# Patient Record
Sex: Female | Born: 1985 | Race: Black or African American | Hispanic: No | Smoking: Former smoker
Health system: Southern US, Community
[De-identification: ages and names within clinical notes are randomized; demographics above are authoritative.]

## PROBLEM LIST (undated history)

## (undated) ENCOUNTER — Emergency Department (HOSPITAL_BASED_OUTPATIENT_CLINIC_OR_DEPARTMENT_OTHER): Discharge status: Absent without leave | Payer: MEDICAID

## (undated) DIAGNOSIS — D649 Anemia, unspecified: Secondary | ICD-10-CM

## (undated) DIAGNOSIS — E11319 Type 2 diabetes mellitus with unspecified diabetic retinopathy without macular edema: Secondary | ICD-10-CM

## (undated) DIAGNOSIS — N183 Chronic kidney disease, stage 3 unspecified: Secondary | ICD-10-CM

## (undated) DIAGNOSIS — K029 Dental caries, unspecified: Secondary | ICD-10-CM

## (undated) DIAGNOSIS — R87619 Unspecified abnormal cytological findings in specimens from cervix uteri: Secondary | ICD-10-CM

## (undated) DIAGNOSIS — G709 Myoneural disorder, unspecified: Secondary | ICD-10-CM

## (undated) DIAGNOSIS — R569 Unspecified convulsions: Secondary | ICD-10-CM

## (undated) DIAGNOSIS — F329 Major depressive disorder, single episode, unspecified: Secondary | ICD-10-CM

## (undated) DIAGNOSIS — E785 Hyperlipidemia, unspecified: Secondary | ICD-10-CM

## (undated) DIAGNOSIS — E039 Hypothyroidism, unspecified: Secondary | ICD-10-CM

## (undated) DIAGNOSIS — E109 Type 1 diabetes mellitus without complications: Secondary | ICD-10-CM

## (undated) DIAGNOSIS — F419 Anxiety disorder, unspecified: Secondary | ICD-10-CM

## (undated) DIAGNOSIS — K219 Gastro-esophageal reflux disease without esophagitis: Secondary | ICD-10-CM

## (undated) DIAGNOSIS — H35039 Hypertensive retinopathy, unspecified eye: Secondary | ICD-10-CM

## (undated) DIAGNOSIS — T7491XA Unspecified adult maltreatment, confirmed, initial encounter: Secondary | ICD-10-CM

## (undated) DIAGNOSIS — I639 Cerebral infarction, unspecified: Secondary | ICD-10-CM

## (undated) DIAGNOSIS — K2970 Gastritis, unspecified, without bleeding: Secondary | ICD-10-CM

## (undated) DIAGNOSIS — H269 Unspecified cataract: Secondary | ICD-10-CM

## (undated) DIAGNOSIS — Z5189 Encounter for other specified aftercare: Secondary | ICD-10-CM

## (undated) DIAGNOSIS — G4700 Insomnia, unspecified: Secondary | ICD-10-CM

## (undated) DIAGNOSIS — I10 Essential (primary) hypertension: Secondary | ICD-10-CM

## (undated) HISTORY — DX: Type 1 diabetes mellitus without complications: E10.9

## (undated) HISTORY — DX: Hypertensive retinopathy, unspecified eye: H35.039

## (undated) HISTORY — DX: Major depressive disorder, single episode, unspecified: F32.9

## (undated) HISTORY — DX: Unspecified convulsions: R56.9

## (undated) HISTORY — DX: Dental caries, unspecified: K02.9

## (undated) HISTORY — DX: Unspecified abnormal cytological findings in specimens from cervix uteri: R87.619

## (undated) HISTORY — DX: Anxiety disorder, unspecified: F41.9

## (undated) HISTORY — DX: Hyperlipidemia, unspecified: E78.5

## (undated) HISTORY — DX: Type 2 diabetes mellitus with unspecified diabetic retinopathy without macular edema: E11.319

## (undated) HISTORY — DX: Unspecified adult maltreatment, confirmed, initial encounter: T74.91XA

## (undated) HISTORY — DX: Anemia, unspecified: D64.9

## (undated) HISTORY — DX: Unspecified cataract: H26.9

## (undated) HISTORY — DX: Encounter for other specified aftercare: Z51.89

## (undated) HISTORY — DX: Insomnia, unspecified: G47.00

## (undated) HISTORY — DX: Essential (primary) hypertension: I10

## (undated) HISTORY — DX: Hypothyroidism, unspecified: E03.9

## (undated) HISTORY — DX: Gastritis, unspecified, without bleeding: K29.70

## (undated) SURGERY — EGD (ESOPHAGOGASTRODUODENOSCOPY)
Anesthesia: Monitor Anesthesia Care
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
## 2004-10-06 ENCOUNTER — Ambulatory Visit: Payer: Self-pay | Admitting: *Deleted

## 2004-10-27 ENCOUNTER — Emergency Department (HOSPITAL_COMMUNITY): Admission: EM | Admit: 2004-10-27 | Discharge: 2004-10-27 | Payer: Self-pay | Admitting: Emergency Medicine

## 2004-11-09 ENCOUNTER — Inpatient Hospital Stay (HOSPITAL_COMMUNITY): Admission: EM | Admit: 2004-11-09 | Discharge: 2004-11-12 | Payer: Self-pay | Admitting: Emergency Medicine

## 2004-11-15 ENCOUNTER — Ambulatory Visit (HOSPITAL_COMMUNITY): Admission: RE | Admit: 2004-11-15 | Discharge: 2004-11-15 | Payer: Self-pay | Admitting: Internal Medicine

## 2004-12-13 ENCOUNTER — Emergency Department (HOSPITAL_COMMUNITY): Admission: EM | Admit: 2004-12-13 | Discharge: 2004-12-14 | Payer: Self-pay | Admitting: Emergency Medicine

## 2004-12-19 HISTORY — PX: FOOT FUSION: SHX956

## 2004-12-24 ENCOUNTER — Ambulatory Visit: Payer: Self-pay | Admitting: Internal Medicine

## 2004-12-29 ENCOUNTER — Encounter (HOSPITAL_COMMUNITY): Admission: RE | Admit: 2004-12-29 | Discharge: 2005-03-29 | Payer: Self-pay | Admitting: *Deleted

## 2004-12-31 ENCOUNTER — Encounter (INDEPENDENT_AMBULATORY_CARE_PROVIDER_SITE_OTHER): Payer: Self-pay | Admitting: *Deleted

## 2004-12-31 ENCOUNTER — Ambulatory Visit: Payer: Self-pay | Admitting: Internal Medicine

## 2004-12-31 ENCOUNTER — Inpatient Hospital Stay (HOSPITAL_COMMUNITY): Admission: EM | Admit: 2004-12-31 | Discharge: 2005-01-01 | Payer: Self-pay | Admitting: Emergency Medicine

## 2005-01-13 LAB — CONVERTED CEMR LAB: Pap Smear: NORMAL

## 2005-01-31 ENCOUNTER — Ambulatory Visit: Payer: Self-pay

## 2005-02-18 ENCOUNTER — Ambulatory Visit: Payer: Self-pay | Admitting: Internal Medicine

## 2005-03-24 ENCOUNTER — Ambulatory Visit: Payer: Self-pay | Admitting: Internal Medicine

## 2005-04-15 ENCOUNTER — Ambulatory Visit: Payer: Self-pay | Admitting: Internal Medicine

## 2005-05-18 ENCOUNTER — Ambulatory Visit: Payer: Self-pay | Admitting: Internal Medicine

## 2005-06-24 ENCOUNTER — Ambulatory Visit: Payer: Self-pay | Admitting: Internal Medicine

## 2005-07-18 ENCOUNTER — Emergency Department (HOSPITAL_COMMUNITY): Admission: EM | Admit: 2005-07-18 | Discharge: 2005-07-18 | Payer: Self-pay | Admitting: Emergency Medicine

## 2005-08-07 ENCOUNTER — Ambulatory Visit: Payer: Self-pay | Admitting: Psychiatry

## 2005-08-07 ENCOUNTER — Inpatient Hospital Stay (HOSPITAL_COMMUNITY): Admission: EM | Admit: 2005-08-07 | Discharge: 2005-08-10 | Payer: Self-pay | Admitting: Psychiatry

## 2005-10-11 ENCOUNTER — Emergency Department (HOSPITAL_COMMUNITY): Admission: EM | Admit: 2005-10-11 | Discharge: 2005-10-11 | Payer: Self-pay | Admitting: Family Medicine

## 2005-10-15 ENCOUNTER — Emergency Department (HOSPITAL_COMMUNITY): Admission: AD | Admit: 2005-10-15 | Discharge: 2005-10-15 | Payer: Self-pay | Admitting: Family Medicine

## 2005-10-21 ENCOUNTER — Emergency Department (HOSPITAL_COMMUNITY): Admission: EM | Admit: 2005-10-21 | Discharge: 2005-10-21 | Payer: Self-pay | Admitting: Family Medicine

## 2005-10-25 ENCOUNTER — Ambulatory Visit: Payer: Self-pay | Admitting: Internal Medicine

## 2005-12-13 ENCOUNTER — Emergency Department (HOSPITAL_COMMUNITY): Admission: EM | Admit: 2005-12-13 | Discharge: 2005-12-13 | Payer: Self-pay | Admitting: Family Medicine

## 2006-01-06 ENCOUNTER — Emergency Department (HOSPITAL_COMMUNITY): Admission: EM | Admit: 2006-01-06 | Discharge: 2006-01-06 | Payer: Self-pay | Admitting: Family Medicine

## 2006-02-06 ENCOUNTER — Emergency Department (HOSPITAL_COMMUNITY): Admission: EM | Admit: 2006-02-06 | Discharge: 2006-02-06 | Payer: Self-pay | Admitting: Family Medicine

## 2006-03-20 ENCOUNTER — Emergency Department (HOSPITAL_COMMUNITY): Admission: EM | Admit: 2006-03-20 | Discharge: 2006-03-20 | Payer: Self-pay | Admitting: Emergency Medicine

## 2006-04-14 ENCOUNTER — Emergency Department (HOSPITAL_COMMUNITY): Admission: EM | Admit: 2006-04-14 | Discharge: 2006-04-14 | Payer: Self-pay | Admitting: Family Medicine

## 2006-04-18 ENCOUNTER — Ambulatory Visit: Payer: Self-pay | Admitting: Internal Medicine

## 2006-04-20 ENCOUNTER — Ambulatory Visit (HOSPITAL_COMMUNITY): Admission: RE | Admit: 2006-04-20 | Discharge: 2006-04-20 | Payer: Self-pay | Admitting: *Deleted

## 2006-04-26 ENCOUNTER — Ambulatory Visit: Payer: Self-pay | Admitting: Internal Medicine

## 2006-05-10 ENCOUNTER — Ambulatory Visit: Payer: Self-pay | Admitting: Internal Medicine

## 2006-05-23 ENCOUNTER — Emergency Department (HOSPITAL_COMMUNITY): Admission: EM | Admit: 2006-05-23 | Discharge: 2006-05-23 | Payer: Self-pay | Admitting: Emergency Medicine

## 2006-05-26 ENCOUNTER — Emergency Department (HOSPITAL_COMMUNITY): Admission: EM | Admit: 2006-05-26 | Discharge: 2006-05-26 | Payer: Self-pay | Admitting: Emergency Medicine

## 2006-06-24 ENCOUNTER — Emergency Department (HOSPITAL_COMMUNITY): Admission: EM | Admit: 2006-06-24 | Discharge: 2006-06-24 | Payer: Self-pay | Admitting: Emergency Medicine

## 2006-06-27 ENCOUNTER — Inpatient Hospital Stay (HOSPITAL_COMMUNITY): Admission: EM | Admit: 2006-06-27 | Discharge: 2006-06-29 | Payer: Self-pay | Admitting: Emergency Medicine

## 2006-06-27 ENCOUNTER — Ambulatory Visit: Payer: Self-pay | Admitting: Internal Medicine

## 2006-07-12 ENCOUNTER — Inpatient Hospital Stay (HOSPITAL_COMMUNITY): Admission: EM | Admit: 2006-07-12 | Discharge: 2006-07-15 | Payer: Self-pay | Admitting: Emergency Medicine

## 2006-07-31 ENCOUNTER — Emergency Department (HOSPITAL_COMMUNITY): Admission: EM | Admit: 2006-07-31 | Discharge: 2006-07-31 | Payer: Self-pay | Admitting: Emergency Medicine

## 2006-08-28 ENCOUNTER — Encounter (INDEPENDENT_AMBULATORY_CARE_PROVIDER_SITE_OTHER): Payer: Self-pay | Admitting: *Deleted

## 2006-08-28 ENCOUNTER — Ambulatory Visit: Payer: Self-pay | Admitting: Internal Medicine

## 2006-09-14 DIAGNOSIS — F329 Major depressive disorder, single episode, unspecified: Secondary | ICD-10-CM

## 2006-09-14 DIAGNOSIS — F3289 Other specified depressive episodes: Secondary | ICD-10-CM

## 2006-09-14 DIAGNOSIS — E039 Hypothyroidism, unspecified: Secondary | ICD-10-CM | POA: Insufficient documentation

## 2006-09-14 HISTORY — DX: Other specified depressive episodes: F32.89

## 2006-09-14 HISTORY — DX: Major depressive disorder, single episode, unspecified: F32.9

## 2006-11-15 ENCOUNTER — Emergency Department (HOSPITAL_COMMUNITY): Admission: EM | Admit: 2006-11-15 | Discharge: 2006-11-15 | Payer: Self-pay | Admitting: Emergency Medicine

## 2006-11-17 ENCOUNTER — Encounter (INDEPENDENT_AMBULATORY_CARE_PROVIDER_SITE_OTHER): Payer: Self-pay | Admitting: Specialist

## 2006-11-17 ENCOUNTER — Ambulatory Visit: Payer: Self-pay | Admitting: Internal Medicine

## 2006-11-17 ENCOUNTER — Encounter (INDEPENDENT_AMBULATORY_CARE_PROVIDER_SITE_OTHER): Payer: Self-pay | Admitting: *Deleted

## 2006-11-17 LAB — CONVERTED CEMR LAB
Calcium: 9.7 mg/dL (ref 8.4–10.5)
Candida species: NEGATIVE
GC Probe Amp, Genital: NEGATIVE
Leukocytes, UA: NEGATIVE
Microalb Creat Ratio: 5.1 mg/g (ref 0.0–30.0)
Microalb, Ur: 0.2 mg/dL (ref 0.00–1.89)
Nitrite: NEGATIVE
Potassium: 4.2 meq/L (ref 3.5–5.3)
Protein, ur: NEGATIVE mg/dL
RBC / HPF: NONE SEEN (ref <3)
Sodium: 129 meq/L — ABNORMAL LOW (ref 135–145)
Specific Gravity, Urine: 1.04 — ABNORMAL HIGH (ref 1.005–1.03)
TSH: 9.839 microintl units/mL — ABNORMAL HIGH (ref 0.350–5.50)
Trichomonal Vaginitis: NEGATIVE
Urobilinogen, UA: 0.2 (ref 0.0–1.0)
WBC, UA: NONE SEEN cells/hpf (ref <3)

## 2006-11-30 ENCOUNTER — Ambulatory Visit: Payer: Self-pay | Admitting: *Deleted

## 2006-12-26 ENCOUNTER — Encounter (INDEPENDENT_AMBULATORY_CARE_PROVIDER_SITE_OTHER): Payer: Self-pay | Admitting: *Deleted

## 2006-12-26 ENCOUNTER — Ambulatory Visit: Payer: Self-pay | Admitting: Internal Medicine

## 2006-12-26 LAB — CONVERTED CEMR LAB
BUN: 9 mg/dL (ref 6–23)
Calcium: 9.4 mg/dL (ref 8.4–10.5)
Creatinine, Ser: 0.75 mg/dL (ref 0.40–1.20)
Glucose, Bld: 580 mg/dL (ref 70–99)
TSH: 5.848 microintl units/mL — ABNORMAL HIGH (ref 0.350–5.50)

## 2007-01-10 ENCOUNTER — Telehealth: Payer: Self-pay | Admitting: *Deleted

## 2007-02-07 ENCOUNTER — Emergency Department (HOSPITAL_COMMUNITY): Admission: EM | Admit: 2007-02-07 | Discharge: 2007-02-07 | Payer: Self-pay | Admitting: Emergency Medicine

## 2007-02-16 ENCOUNTER — Encounter (INDEPENDENT_AMBULATORY_CARE_PROVIDER_SITE_OTHER): Payer: Self-pay | Admitting: *Deleted

## 2007-04-28 ENCOUNTER — Emergency Department (HOSPITAL_COMMUNITY): Admission: EM | Admit: 2007-04-28 | Discharge: 2007-04-28 | Payer: Self-pay | Admitting: Emergency Medicine

## 2007-06-20 ENCOUNTER — Ambulatory Visit: Payer: Self-pay | Admitting: *Deleted

## 2007-06-20 ENCOUNTER — Encounter (INDEPENDENT_AMBULATORY_CARE_PROVIDER_SITE_OTHER): Payer: Self-pay | Admitting: Internal Medicine

## 2007-06-21 LAB — CONVERTED CEMR LAB
BUN: 8 mg/dL (ref 6–23)
Calcium: 9.9 mg/dL (ref 8.4–10.5)
Chlamydia, Swab/Urine, PCR: NEGATIVE
Chloride: 101 meq/L (ref 96–112)
Creatinine, Ser: 0.76 mg/dL (ref 0.40–1.20)
HCT: 34.7 % — ABNORMAL LOW (ref 36.0–46.0)
Hemoglobin: 10.8 g/dL — ABNORMAL LOW (ref 12.0–15.0)
MCHC: 31.1 g/dL (ref 30.0–36.0)
MCV: 85.9 fL (ref 78.0–100.0)
Microalb Creat Ratio: 5.1 mg/g (ref 0.0–30.0)
RDW: 12.7 % (ref 11.5–14.0)

## 2007-07-03 ENCOUNTER — Ambulatory Visit: Payer: Self-pay | Admitting: Hospitalist

## 2007-07-03 ENCOUNTER — Encounter (INDEPENDENT_AMBULATORY_CARE_PROVIDER_SITE_OTHER): Payer: Self-pay | Admitting: Internal Medicine

## 2007-07-03 LAB — CONVERTED CEMR LAB
Blood Glucose, Fingerstick: 259
Leukocytes, UA: NEGATIVE
Nitrite: NEGATIVE
Protein, ur: NEGATIVE mg/dL
pH: 6 (ref 5.0–8.0)

## 2007-08-02 ENCOUNTER — Telehealth: Payer: Self-pay | Admitting: *Deleted

## 2007-08-13 ENCOUNTER — Telehealth: Payer: Self-pay | Admitting: *Deleted

## 2007-09-17 ENCOUNTER — Encounter (INDEPENDENT_AMBULATORY_CARE_PROVIDER_SITE_OTHER): Payer: Self-pay | Admitting: Internal Medicine

## 2007-09-17 ENCOUNTER — Ambulatory Visit: Payer: Self-pay | Admitting: Internal Medicine

## 2007-09-20 ENCOUNTER — Encounter (INDEPENDENT_AMBULATORY_CARE_PROVIDER_SITE_OTHER): Payer: Self-pay | Admitting: Internal Medicine

## 2007-09-20 LAB — CONVERTED CEMR LAB
CO2: 28 meq/L (ref 19–32)
Glucose, Bld: 165 mg/dL — ABNORMAL HIGH (ref 70–99)
Potassium: 4 meq/L (ref 3.5–5.3)
Sodium: 140 meq/L (ref 135–145)

## 2007-09-25 ENCOUNTER — Telehealth: Payer: Self-pay | Admitting: *Deleted

## 2007-10-02 ENCOUNTER — Encounter (INDEPENDENT_AMBULATORY_CARE_PROVIDER_SITE_OTHER): Payer: Self-pay | Admitting: Internal Medicine

## 2007-12-09 ENCOUNTER — Inpatient Hospital Stay (HOSPITAL_COMMUNITY): Admission: EM | Admit: 2007-12-09 | Discharge: 2007-12-10 | Payer: Self-pay | Admitting: Emergency Medicine

## 2007-12-09 ENCOUNTER — Ambulatory Visit: Payer: Self-pay | Admitting: Internal Medicine

## 2007-12-26 ENCOUNTER — Encounter (INDEPENDENT_AMBULATORY_CARE_PROVIDER_SITE_OTHER): Payer: Self-pay | Admitting: Internal Medicine

## 2007-12-27 ENCOUNTER — Telehealth: Payer: Self-pay | Admitting: *Deleted

## 2007-12-28 ENCOUNTER — Telehealth (INDEPENDENT_AMBULATORY_CARE_PROVIDER_SITE_OTHER): Payer: Self-pay | Admitting: *Deleted

## 2008-01-08 ENCOUNTER — Ambulatory Visit: Payer: Self-pay | Admitting: Internal Medicine

## 2008-01-08 ENCOUNTER — Encounter (INDEPENDENT_AMBULATORY_CARE_PROVIDER_SITE_OTHER): Payer: Self-pay | Admitting: Internal Medicine

## 2008-01-09 LAB — CONVERTED CEMR LAB
AST: 16 units/L (ref 0–37)
Alkaline Phosphatase: 75 units/L (ref 39–117)
BUN: 8 mg/dL (ref 6–23)
Basophils Relative: 1 % (ref 0–1)
Calcium: 9.4 mg/dL (ref 8.4–10.5)
Chloride: 100 meq/L (ref 96–112)
Cortisol, Plasma: 17.5 ug/dL
Creatinine, Ser: 0.77 mg/dL (ref 0.40–1.20)
Eosinophils Absolute: 0.1 10*3/uL (ref 0.0–0.7)
Hemoglobin: 12 g/dL (ref 12.0–15.0)
MCHC: 32.9 g/dL (ref 30.0–36.0)
MCV: 85.7 fL (ref 78.0–100.0)
Monocytes Absolute: 0.6 10*3/uL (ref 0.1–1.0)
Monocytes Relative: 13 % — ABNORMAL HIGH (ref 3–12)
RBC: 4.26 M/uL (ref 3.87–5.11)

## 2008-01-14 ENCOUNTER — Ambulatory Visit (HOSPITAL_COMMUNITY): Admission: RE | Admit: 2008-01-14 | Discharge: 2008-01-14 | Payer: Self-pay | Admitting: Internal Medicine

## 2008-01-14 ENCOUNTER — Encounter (INDEPENDENT_AMBULATORY_CARE_PROVIDER_SITE_OTHER): Payer: Self-pay | Admitting: Internal Medicine

## 2008-01-25 ENCOUNTER — Telehealth (INDEPENDENT_AMBULATORY_CARE_PROVIDER_SITE_OTHER): Payer: Self-pay | Admitting: *Deleted

## 2008-02-19 ENCOUNTER — Ambulatory Visit: Payer: Self-pay | Admitting: *Deleted

## 2008-03-06 ENCOUNTER — Encounter (INDEPENDENT_AMBULATORY_CARE_PROVIDER_SITE_OTHER): Payer: Self-pay | Admitting: Internal Medicine

## 2008-03-26 ENCOUNTER — Encounter (INDEPENDENT_AMBULATORY_CARE_PROVIDER_SITE_OTHER): Payer: Self-pay | Admitting: Internal Medicine

## 2008-03-26 ENCOUNTER — Ambulatory Visit: Payer: Self-pay | Admitting: Hospitalist

## 2008-03-26 LAB — CONVERTED CEMR LAB: Blood Glucose, Fingerstick: 168

## 2008-03-27 LAB — CONVERTED CEMR LAB
BUN: 8 mg/dL (ref 6–23)
Calcium: 9.4 mg/dL (ref 8.4–10.5)
Creatinine, Ser: 0.75 mg/dL (ref 0.40–1.20)
Glucose, Bld: 196 mg/dL — ABNORMAL HIGH (ref 70–99)
Microalb, Ur: 2.08 mg/dL — ABNORMAL HIGH (ref 0.00–1.89)
Sodium: 139 meq/L (ref 135–145)
TSH: 3.053 microintl units/mL (ref 0.350–5.50)

## 2008-04-29 ENCOUNTER — Inpatient Hospital Stay (HOSPITAL_COMMUNITY): Admission: EM | Admit: 2008-04-29 | Discharge: 2008-05-03 | Payer: Self-pay | Admitting: Emergency Medicine

## 2008-04-29 ENCOUNTER — Ambulatory Visit: Payer: Self-pay | Admitting: Internal Medicine

## 2008-05-02 ENCOUNTER — Encounter (INDEPENDENT_AMBULATORY_CARE_PROVIDER_SITE_OTHER): Payer: Self-pay | Admitting: Internal Medicine

## 2008-05-03 ENCOUNTER — Encounter (INDEPENDENT_AMBULATORY_CARE_PROVIDER_SITE_OTHER): Payer: Self-pay | Admitting: *Deleted

## 2008-05-05 ENCOUNTER — Encounter: Payer: Self-pay | Admitting: Gastroenterology

## 2008-05-08 ENCOUNTER — Ambulatory Visit: Payer: Self-pay | Admitting: Gastroenterology

## 2008-05-20 ENCOUNTER — Ambulatory Visit: Payer: Self-pay | Admitting: Internal Medicine

## 2008-05-26 ENCOUNTER — Telehealth: Payer: Self-pay | Admitting: *Deleted

## 2008-06-04 ENCOUNTER — Emergency Department (HOSPITAL_COMMUNITY): Admission: EM | Admit: 2008-06-04 | Discharge: 2008-06-04 | Payer: Self-pay | Admitting: Emergency Medicine

## 2008-10-01 ENCOUNTER — Encounter (INDEPENDENT_AMBULATORY_CARE_PROVIDER_SITE_OTHER): Payer: Self-pay | Admitting: Internal Medicine

## 2008-10-01 ENCOUNTER — Ambulatory Visit: Payer: Self-pay | Admitting: Infectious Disease

## 2008-10-01 DIAGNOSIS — D649 Anemia, unspecified: Secondary | ICD-10-CM

## 2008-10-01 LAB — CONVERTED CEMR LAB: Blood Glucose, Fingerstick: 363

## 2008-10-02 ENCOUNTER — Ambulatory Visit (HOSPITAL_COMMUNITY): Admission: RE | Admit: 2008-10-02 | Discharge: 2008-10-02 | Payer: Self-pay | Admitting: Internal Medicine

## 2008-10-03 LAB — CONVERTED CEMR LAB
Alkaline Phosphatase: 73 units/L (ref 39–117)
BUN: 6 mg/dL (ref 6–23)
Bilirubin Urine: NEGATIVE
CO2: 24 meq/L (ref 19–32)
Cholesterol: 220 mg/dL — ABNORMAL HIGH (ref 0–200)
Eosinophils Absolute: 0 10*3/uL (ref 0.0–0.7)
Eosinophils Relative: 1 % (ref 0–5)
Ferritin: 56 ng/mL (ref 10–291)
Glucose, Bld: 417 mg/dL — ABNORMAL HIGH (ref 70–99)
HCT: 39 % (ref 36.0–46.0)
HDL: 69 mg/dL (ref >39)
Hemoglobin: 12.1 g/dL (ref 12.0–15.0)
LDL Cholesterol: 133 mg/dL — ABNORMAL HIGH (ref 0–99)
Leukocytes, UA: NEGATIVE
Lymphocytes Relative: 41 % (ref 12–46)
Lymphs Abs: 1.5 10*3/uL (ref 0.7–4.0)
MCV: 85.2 fL (ref 78.0–100.0)
Monocytes Absolute: 0.3 10*3/uL (ref 0.1–1.0)
Monocytes Relative: 9 % (ref 3–12)
Protein, ur: NEGATIVE mg/dL
RBC: 4.58 M/uL (ref 3.87–5.11)
Specific Gravity, Urine: 1.031 — ABNORMAL HIGH (ref 1.005–1.03)
Total Bilirubin: 0.6 mg/dL (ref 0.3–1.2)
Triglycerides: 88 mg/dL (ref <150)
Urine Glucose: >1000 mg/dL — AB
Urobilinogen, UA: 0.2 (ref 0.0–1.0)
VLDL: 18 mg/dL (ref 0–40)
WBC: 3.7 10*3/uL — ABNORMAL LOW (ref 4.0–10.5)

## 2008-12-19 ENCOUNTER — Encounter: Payer: Self-pay | Admitting: Internal Medicine

## 2009-03-02 ENCOUNTER — Telehealth (INDEPENDENT_AMBULATORY_CARE_PROVIDER_SITE_OTHER): Payer: Self-pay | Admitting: Pharmacy Technician

## 2009-03-03 ENCOUNTER — Emergency Department (HOSPITAL_COMMUNITY): Admission: EM | Admit: 2009-03-03 | Discharge: 2009-03-03 | Payer: Self-pay | Admitting: Emergency Medicine

## 2009-03-13 ENCOUNTER — Telehealth (INDEPENDENT_AMBULATORY_CARE_PROVIDER_SITE_OTHER): Payer: Self-pay | Admitting: Internal Medicine

## 2009-03-31 ENCOUNTER — Ambulatory Visit: Payer: Self-pay | Admitting: Internal Medicine

## 2009-03-31 LAB — CONVERTED CEMR LAB: Blood Glucose, Fingerstick: 169

## 2009-04-01 ENCOUNTER — Encounter (INDEPENDENT_AMBULATORY_CARE_PROVIDER_SITE_OTHER): Payer: Self-pay | Admitting: Internal Medicine

## 2009-04-03 ENCOUNTER — Ambulatory Visit (HOSPITAL_COMMUNITY): Admission: RE | Admit: 2009-04-03 | Discharge: 2009-04-03 | Payer: Self-pay | Admitting: Internal Medicine

## 2009-04-03 ENCOUNTER — Encounter (INDEPENDENT_AMBULATORY_CARE_PROVIDER_SITE_OTHER): Payer: Self-pay | Admitting: Internal Medicine

## 2009-04-18 ENCOUNTER — Emergency Department (HOSPITAL_COMMUNITY): Admission: EM | Admit: 2009-04-18 | Discharge: 2009-04-18 | Payer: Self-pay | Admitting: Emergency Medicine

## 2009-04-23 ENCOUNTER — Ambulatory Visit: Payer: Self-pay | Admitting: Internal Medicine

## 2009-04-23 ENCOUNTER — Encounter (INDEPENDENT_AMBULATORY_CARE_PROVIDER_SITE_OTHER): Payer: Self-pay | Admitting: Internal Medicine

## 2009-04-23 LAB — CONVERTED CEMR LAB
ALT: 10 units/L (ref 0–35)
AST: 12 units/L (ref 0–37)
Albumin: 4.1 g/dL (ref 3.5–5.2)
Basophils Absolute: 0 10*3/uL (ref 0.0–0.1)
CO2: 25 meq/L (ref 19–32)
Calcium: 9 mg/dL (ref 8.4–10.5)
Chloride: 102 meq/L (ref 96–112)
Creatinine, Ser: 0.84 mg/dL (ref 0.40–1.20)
Eosinophils Absolute: 0 10*3/uL (ref 0.0–0.7)
Eosinophils Relative: 1 % (ref 0–5)
GC Probe Amp, Genital: NEGATIVE
GFR calc Af Amer: >60 mL/min (ref >60)
HCT: 31.7 % — ABNORMAL LOW (ref 36.0–46.0)
Hemoglobin: 10.2 g/dL — ABNORMAL LOW (ref 12.0–15.0)
Lymphocytes Relative: 42 % (ref 12–46)
MCHC: 32.2 g/dL (ref 30.0–36.0)
MCV: 86.1 fL (ref 78.0–100.0)
Monocytes Absolute: 0.2 10*3/uL (ref 0.1–1.0)
Platelets: 135 10*3/uL — ABNORMAL LOW (ref 150–400)
Potassium: 4 meq/L (ref 3.5–5.3)
RDW: 13.1 % (ref 11.5–15.5)
Total Protein: 6.6 g/dL (ref 6.0–8.3)

## 2009-04-23 LAB — HM PAP SMEAR

## 2009-04-24 LAB — CONVERTED CEMR LAB
Gardnerella vaginalis: POSITIVE — AB
Trichomonal Vaginitis: NEGATIVE

## 2009-07-01 ENCOUNTER — Other Ambulatory Visit: Admission: RE | Admit: 2009-07-01 | Discharge: 2009-07-01 | Payer: Self-pay | Admitting: Obstetrics and Gynecology

## 2009-07-01 ENCOUNTER — Ambulatory Visit: Payer: Self-pay | Admitting: Obstetrics and Gynecology

## 2009-08-06 ENCOUNTER — Telehealth (INDEPENDENT_AMBULATORY_CARE_PROVIDER_SITE_OTHER): Payer: Self-pay | Admitting: Internal Medicine

## 2009-08-18 ENCOUNTER — Encounter: Payer: Self-pay | Admitting: Internal Medicine

## 2009-09-18 ENCOUNTER — Telehealth (INDEPENDENT_AMBULATORY_CARE_PROVIDER_SITE_OTHER): Payer: Self-pay | Admitting: *Deleted

## 2009-10-14 ENCOUNTER — Ambulatory Visit: Payer: Self-pay | Admitting: Internal Medicine

## 2009-10-14 ENCOUNTER — Encounter: Payer: Self-pay | Admitting: Internal Medicine

## 2009-10-14 LAB — CONVERTED CEMR LAB
ALT: 12 units/L (ref 0–35)
BUN: 12 mg/dL (ref 6–23)
Basophils Relative: 0 % (ref 0–1)
CO2: 26 meq/L (ref 19–32)
Calcium: 10.8 mg/dL — ABNORMAL HIGH (ref 8.4–10.5)
Creatinine, Ser: 0.92 mg/dL (ref 0.40–1.20)
Creatinine, Urine: 120.9 mg/dL
Eosinophils Absolute: 0 10*3/uL (ref 0.0–0.7)
Eosinophils Relative: 1 % (ref 0–5)
Glucose, Bld: 209 mg/dL — ABNORMAL HIGH (ref 70–99)
HCT: 35.8 % — ABNORMAL LOW (ref 36.0–46.0)
Hemoglobin: 11.8 g/dL — ABNORMAL LOW (ref 12.0–15.0)
Hgb A1c MFr Bld: 10.5 %
Leukocytes, UA: NEGATIVE
MCHC: 33 g/dL (ref 30.0–36.0)
MCV: 83.6 fL (ref >=78.0)
Microalb Creat Ratio: 8.7 mg/g (ref 0.0–30.0)
Monocytes Relative: 8 % (ref 3–12)
Neutrophils Relative %: 44 % (ref 43–77)
Nitrite: NEGATIVE
Protein, ur: NEGATIVE mg/dL
Specific Gravity, Urine: 1.016 (ref 1.005–1.0)
TSH: 10.53 microintl units/mL — ABNORMAL HIGH (ref 0.350–4.5)
Total Bilirubin: 0.4 mg/dL (ref 0.3–1.2)
Urobilinogen, UA: 0.2 (ref 0.0–1.0)
WBC: 3.8 10*3/uL — ABNORMAL LOW (ref 4.0–10.5)

## 2010-03-10 ENCOUNTER — Telehealth: Payer: Self-pay | Admitting: Internal Medicine

## 2010-03-17 ENCOUNTER — Telehealth: Payer: Self-pay | Admitting: *Deleted

## 2010-04-15 ENCOUNTER — Encounter: Payer: Self-pay | Admitting: Internal Medicine

## 2010-04-20 ENCOUNTER — Telehealth: Payer: Self-pay | Admitting: *Deleted

## 2010-04-20 ENCOUNTER — Emergency Department (HOSPITAL_COMMUNITY): Admission: EM | Admit: 2010-04-20 | Discharge: 2010-04-20 | Payer: Self-pay | Admitting: Emergency Medicine

## 2010-04-21 ENCOUNTER — Ambulatory Visit: Payer: Self-pay | Admitting: Internal Medicine

## 2010-04-21 LAB — CONVERTED CEMR LAB
Blood Glucose, Fingerstick: 327
Hgb A1c MFr Bld: 8.2 %

## 2010-04-22 ENCOUNTER — Encounter: Payer: Self-pay | Admitting: Internal Medicine

## 2010-04-22 LAB — CONVERTED CEMR LAB
BUN: 11 mg/dL (ref 6–23)
Calcium: 9.8 mg/dL (ref 8.4–10.5)
Creatinine, Ser: 0.83 mg/dL (ref 0.40–1.20)
Free T4: 0.76 ng/dL — ABNORMAL LOW (ref 0.80–1.80)
Vitamin B-12: 421 pg/mL (ref 211–911)

## 2010-04-28 ENCOUNTER — Ambulatory Visit: Payer: Self-pay | Admitting: Internal Medicine

## 2010-04-28 LAB — CONVERTED CEMR LAB
ALT: 16 units/L (ref 0–35)
AST: 18 units/L (ref 0–37)
Bilirubin, Direct: 0.1 mg/dL (ref 0.0–0.3)
Indirect Bilirubin: 0.3 mg/dL (ref 0.0–0.9)
Lipase: <10 units/L (ref 0–75)
Total Protein: 8.2 g/dL (ref 6.0–8.3)

## 2010-09-01 ENCOUNTER — Telehealth: Payer: Self-pay | Admitting: Internal Medicine

## 2010-09-21 ENCOUNTER — Emergency Department (HOSPITAL_COMMUNITY): Admission: EM | Admit: 2010-09-21 | Discharge: 2010-09-21 | Payer: Self-pay | Admitting: Emergency Medicine

## 2010-11-01 ENCOUNTER — Emergency Department (HOSPITAL_COMMUNITY): Admission: EM | Admit: 2010-11-01 | Discharge: 2010-11-01 | Payer: Self-pay | Admitting: Emergency Medicine

## 2010-11-30 ENCOUNTER — Telehealth: Payer: Self-pay | Admitting: Internal Medicine

## 2010-12-29 ENCOUNTER — Ambulatory Visit: Admit: 2010-12-29 | Payer: Self-pay

## 2011-01-12 ENCOUNTER — Telehealth: Payer: Self-pay | Admitting: Internal Medicine

## 2011-01-18 NOTE — Progress Notes (Signed)
Summary: med refill/gp  Phone Note Refill Request Message from:  Fax from Pharmacy on September 01, 2010 4:25 PM  Refills Requested: Medication #1:  SYNTHROID 75 MCG TABS Take 1 tablet by mouth daily.   Dosage confirmed as above?Dosage Confirmed   Last Refilled: 05/29/2010 Last appt. 04/28/10.  Last TSH 04/21/10.   Method Requested: Telephone to Pharmacy Initial call taken by: Morrison Old RN,  September 01, 2010 4:25 PM  Follow-up for Phone Call       Follow-up by: Lester [place] MD,  September 02, 2010 9:31 AM    Prescriptions: SYNTHROID 75 MCG TABS (LEVOTHYROXINE SODIUM) Take 1 tablet by mouth daily.  #30 x 3   Entered and Authorized by:   Lester [place] MD   Signed by:   Lester Clementon MD on 09/02/2010   Method used:   Faxed to ...       [facility] (retail)       [address] [place]       [place], [place]  [address]       Ph: [phone]       Fax: [phone]   RxID:   [id number]

## 2011-01-18 NOTE — Assessment & Plan Note (Signed)
Summary: f/u ems, cbg 29 4/3/pcp-devani/hla   Vital Signs:  Patient profile:   25 year old female Height:      65.25 inches (165.74 cm) Weight:      159.1 pounds (72.32 kg) BMI:     26.37 Temp:     97.5 degrees F (36.39 degrees C) oral Pulse rate:   89 / minute BP sitting:   127 / 89  (right arm)  Vitals Entered By: Hilda Blades Ditzler RN (Apr 21, 2010 11:18 AM) Is Patient Diabetic? Yes Did you bring your meter with you today? No Pain Assessment Patient in pain? yes     Location: chest Intensity: 5 Type: heavy Onset of pain  past 2 days Nutritional Status BMI of 25 - 29 = overweight Nutritional Status Detail appetite fair CBG Result 327  Have you ever been in a relationship where you felt threatened, hurt or afraid?denies   Does patient need assistance? Functional Status Self care Ambulation Normal Comments ER FU - CBG 29 by EMS - exercising more. Novalog makes legs swollen. Numbness right arm x 3 months. CBG are low in AM past 3 months.   Primary Care Provider:  Lester [place] MD   History of Present Illness: She present for follow up after  hypoglycemic  event. She wake up yesterday and she ate strawberrys  and jelly  then she went back to sleep, her syster try to wake her up around 11 Am. and patient  was unresponsive EMS arrived and her blood suagar was at 68. She had dinner the night before. She is using 40 units of lantus at bedtime.  Her blood sugar,  she said has been in the range of 100 to 135 in the morning. She didnt  check blood sugar today or yersterday. Last time she check blood sugar was 3 days ago and in it  was at 130. She has been using 40 units of lantus for a long time.   She is complaining of numbness and tingling on her 4 and 5 finger for months now.  She denies trauma. Numbness get worse whe she raised right arm. No weakness.   Depression History:      The patient denies a depressed mood most of the day and a diminished interest in her usual daily  activities.         Preventive Screening-Counseling & Management  Alcohol-Tobacco     Smoking Status: quit     Smoking Cessation Counseling: yes     Smoke Cessation Stage: relapse     Packs/Day: 1.0     Year Started: 1999     Year Quit: 12/2009     Passive Smoke Counseling: to avoid passive smoke exposure  Allergies: 1)  ! Trimethoprim (Trimethoprim) 2)  ! Sulfamethoxazole  Social History: Smoking Status:  quit  Review of Systems  The patient denies fever, chest pain, syncope, dyspnea on exertion, peripheral edema, prolonged cough, headaches, hemoptysis, abdominal pain, and melena.    Physical Exam  General:  alert, well-developed, and well-nourished.   Head:  normocephalic, atraumatic, and no abnormalities observed.   Lungs:  normal respiratory effort, no intercostal retractions, no accessory muscle use, and normal breath sounds.   Heart:  normal rate and regular rhythm.   Abdomen:  soft, non-tender, normal bowel sounds, no distention, and no masses.   Extremities:  No edema. Neurologic:  alert & oriented X3, cranial nerves II-XII intact, strength normal in all extremities, and sensation intact to light touch.  Impression & Recommendations:  Problem # 1:  DIABETES MELLITUS, TYPE I (ICD-250.01) I will decrease lantus to half dose. She has not been checking her blood sugar in the morning. I will see her in 1 week. I advised her to check blood sugar in the morning and after meals. She relates swelling with use of novolog. I will consider other short acting insulin next visit. I advised patient not to skip meals. I advised her to let us know if she have any low blood sugar. Her HBA1c is better now at 8.  Her updated medication list for this problem includes:    Lantus 100 Unit/ml Soln (Insulin glargine) .[name]... Take 20  units once every night    Novolog 100 Unit/ml Soln (Insulin aspart) .[name]... 1 unit per 15 grams of carbs  Orders: T- Capillary Blood Glucose [id number]) T-Hgb A1C  (in-house) ([id number]) T-Basic Metabolic Panel ([id number])  Problem # 2:  HYPOTHYROIDISM, POSTABLATIVE NEC (ICD-244.1) I will check TSH and Free t4 , will adjust medicine as needed.  Her updated medication list for this problem includes:    Synthroid 50 Mcg Tabs (Levothyroxine sodium) .[name]... Take 1 tablet by mouth once a day  Orders: T-TSH [id number]) T-T4, Free [phone])  Problem # 3:  DIABETIC PERIPHERAL NEUROPATHY (ICD-250.60) She is complaining of numbness and tingling 4 and 5 finger right hand. I will check B12. I think she might benefit MRI spine, will consider this next visit.  Her updated medication list for this problem includes:    Lantus 100 Unit/ml Soln (Insulin glargine) .[name]... Take 20  units once every night    Novolog 100 Unit/ml Soln (Insulin aspart) .[name]... 1 unit per 15 grams of carbs  Orders: T-Vitamin B12 [id number])  Complete Medication List: 1)  Amitriptyline Hcl 50 Mg Tabs (Amitriptyline hcl) .... Take one at bed time 2)  Synthroid 50 Mcg Tabs (Levothyroxine sodium) .... Take 1 tablet by mouth once a day 3)  Lantus 100 Unit/ml Soln (Insulin glargine) .... Take 20  units once every night 4)  Ferrous Sulfate 325 (65 Fe) Mg Tbec (Ferrous sulfate) .... Take one pill two times a day 5)  Proair Hfa 108 (90 Base) Mcg/act Aers (Albuterol sulfate) .... Take 1-2 puffs once every 4-6 hour as needed for shortness of breath 6)  Oxymetazoline Hcl 0.05 % Soln (Oxymetazoline hcl) .... Take 2 sprays per nostril twice a day 7)  Prochlorperazine Maleate 10 Mg Tabs (Prochlorperazine maleate) .... One tab every 6 hours as needed for nausea 8)  Percocet 5-325 Mg Tabs (Oxycodone-acetaminophen) .... One tab every 6 hours for abdominal pain 9)  Reglan 5 Mg Tabs (Metoclopramide hcl) .... Take one tab by mouth 30 minutes before every meal and before going to sleep 10)  Metronidazole 500 Mg Tabs (Metronidazole) .... Take 4 tabs by mouth on day one, then 1 tab by mouth two times a day  for 6 days 11)  Novolog 100 Unit/ml Soln (Insulin aspart) .[name].. 1 unit per 15 grams of carbs  Patient Instructions: 1)  Please schedule a follow-up appointment in 1 weeks. 2)  Please check blood sugar three times a day. fasting in the morning and 2 hours after meals.  3)  Please do not skip meals. 4)  If you have any low blood sugar symptoms take something sweet, check blood sugar and let us know.   Prevention & Chronic Care Immunizations   Influenza vaccine: Fluvax 3+  (10/14/2009)   Influenza vaccine due: 08/19/2010    Tetanus booster:  Not documented    Pneumococcal vaccine: Not documented  Other Screening   Pap smear: ATYPICAL SQUAMOUS CELLS OF UNDETERMINED SIGNIFICANCE  (04/23/2009)   Smoking status: quit  (04/21/2010)  Diabetes Mellitus   HgbA1C: 8.2  (04/21/2010)   HgbA1C action/deferral: Ordered  (10/14/2009)   Hemoglobin A1C due: 01/14/2010    Eye exam: Not documented   Diabetic eye exam action/deferral: Refused  (10/14/2009)    Foot exam: yes  (10/14/2009)   Foot exam action/deferral: Do today   High risk foot: No  (10/14/2009)   Foot care education: Done  (10/14/2009)   Foot exam due: 01/14/2010    Urine microalbumin/creatinine ratio: 8.7  (10/14/2009)   Urine microalbumin action/deferral: Ordered   Urine microalbumin/cr due: 10/14/2010  Self-Management Support :   Personal Goals (by the next clinic visit) :     Personal A1C goal: 7  (10/14/2009)     Personal blood pressure goal: 130/80  (10/14/2009)     Personal LDL goal: 100  (10/14/2009)    Patient will work on the following items until the next clinic visit to reach self-care goals:     Medications and monitoring: take my medicines every day, check my blood sugar, bring all of my medications to every visit, examine my feet every day  (04/21/2010)     Eating: eat more vegetables, use fresh or frozen vegetables, eat foods that are low in salt, eat fruit for snacks and desserts, limit or avoid alcohol   (04/21/2010)     Activity: take a 30 minute walk every day, park at the far end of the parking lot  (04/21/2010)     Other: go to gym for an hour - gets on riding bike  (10/14/2009)    Diabetes self-management support: Written self-care plan, Education handout, Resources for patients handout  (04/21/2010)   Diabetes care plan printed   Diabetes education handout printed      Resource handout printed.  Process Orders Check Orders Results:     [facility]: [id number] not required for this insurance Tests Sent for requisitioning (Apr 21, 2010 2:19 PM):     04/21/2010: [facility] -- T-Basic Metabolic Panel 0000000 (signed)     04/21/2010: [facility] -- T-TSH [phone] (signed)     04/21/2010: [facility] -- [place], [place] [[id number]] (signed)     04/21/2010: [facility] -- T-Vitamin B12 [phone] (signed)    Laboratory Results   Blood Tests   Date/Time Received: Apr 21, 2010 11:43 AM  Date/Time Reported: Lenoria Farrier  Apr 21, 2010 11:43 AM   HGBA1C: 8.2%   (Normal Range: Non-Diabetic - 3-6%   Control Diabetic - 6-8%) CBG Random:: 327mg /dL      Process Orders Check Orders Results:     [facility]: ABN not required for this insurance Tests Sent for requisitioning (Apr 21, 2010 2:19 PM):     04/21/2010: [facility] -- T-Basic Metabolic Panel 0000000 (signed)     04/21/2010: [facility] -- T-TSH [phone] (signed)     04/21/2010: [facility] -- [place], [place] [[id number]] (signed)     04/21/2010: [facility] -- T-Vitamin B12 [phone] (signed)

## 2011-01-18 NOTE — Progress Notes (Signed)
Summary: phone/gg  Phone Note Call from Patient   Summary of Call: I have called the pt several times to set up an appointment and have not been able to reach her.  She has no voice mail.  I called [facility] and they gave me another # [phone] which also has no voice mail.  A letter has been sent to her home requestion her to set up an appointment Initial call taken by: Gevena Cotton RN,  March 17, 2010 9:48 AM

## 2011-01-18 NOTE — Miscellaneous (Signed)
   Hypothyroidism: I will increase levothyroxine to 75 micrograms. I called patient and I inform her result of test.      Prescriptions: SYNTHROID 75 MCG TABS (LEVOTHYROXINE SODIUM) Take 1 tablet by mouth daily.  #30 x 3   Entered and Authorized by:   Niel Hummer MD   Signed by:   Niel Hummer MD on 04/22/2010   Method used:   Electronically to        [facility] [phone]* (retail)       [address]       [place], [place]  [address]       Ph: [phone]       Fax: [phone]   RxID:   [id number]

## 2011-01-18 NOTE — Letter (Signed)
Summary: TWO WEEK GLUCOSE   TWO WEEK GLUCOSE   Imported By: Garlan Fillers 05/03/2010 12:20:33  _____________________________________________________________________  External Attachment:    Type:   Image     Comment:   External Document

## 2011-01-18 NOTE — Progress Notes (Signed)
Summary: refill/gg  Phone Note Refill Request  on March 10, 2010 9:52 AM  Refills Requested: Medication #1:  SYNTHROID 50 MCG  TABS Take 1 tablet by mouth once a day   Last Refilled: 12/08/2009 #100 for [facility]   Method Requested: Fax to [facility] Initial call taken by: Gevena Cotton RN,  March 10, 2010 9:52 AM  Follow-up for Phone Call        Refill approved-nurse to complete Follow-up by: Lester Elon MD,  March 10, 2010 2:54 PM    Prescriptions: SYNTHROID 50 MCG  TABS (LEVOTHYROXINE SODIUM) Take 1 tablet by mouth once a day  #31 x 5   Entered and Authorized by:   Lester Virden MD   Signed by:   Lester Canby MD on 03/10/2010   Method used:   Telephoned to ...       [facility] (retail)       [address] [place]       [place], [place]  [address]       Ph: [phone]       Fax: [phone]   RxID:   [id number]   Please communicate with the patient that she needs to follow up in the clinic soon. Thanks

## 2011-01-18 NOTE — Assessment & Plan Note (Signed)
Summary: ACUTE/DEVANI/1 WEEK RECHECK/CH   Vital Signs:  Patient profile:   25 year old female Height:      65.25 inches Weight:      154.8 pounds BMI:     25.66 Temp:     97.0 degrees F oral Pulse rate:   96 / minute BP sitting:   112 / 81  (right arm)  Vitals Entered By: Silverio Decamp NT II (Apr 28, 2010 10:54 AM) CC: follow-up visit Is Patient Diabetic? Yes Did you bring your meter with you today? No Pain Assessment Patient in pain? no      Nutritional Status BMI of 25 - 29 = overweight CBG Result 137  Have you ever been in a relationship where you felt threatened, hurt or afraid?No   Does patient need assistance? Functional Status Self care Ambulation Normal   Primary Care Provider:  Lester Bethany MD  CC:  follow-up visit.  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 H/o tobacco abuse ongoing     She comes for hypoglycemia follow up. She has been checking blood sugar. She had 1 episode of hypoglycemia at 31 in the morning and another later in the  morning at 43. She is not eating regularly and she is doing exercise. She had 2 reading at 400 at night.  She is having some sinus congestion. She relates that numbness of fingers is better.  At the end of the office she mention that she is having abdominal pain intermittent bilateral upper quadrant. since 2 weeks ago. She didnt mention anything last week. SHe is also having diarrhea. 1 episode of vomiting.   Preventive Screening-Counseling & Management  Alcohol-Tobacco     Smoking Status: quit     Smoking Cessation Counseling: yes     Smoke  Cessation Stage: relapse     Packs/Day: 1.0     Year Started: 1999     Year Quit: 12/2009     Passive Smoke Counseling: to avoid passive smoke exposure  Current Medications (verified): 1)  Amitriptyline Hcl 50 Mg Tabs (Amitriptyline Hcl) .... Take One At Bed Time 2)  Synthroid 75 Mcg Tabs (Levothyroxine Sodium) .... Take 1 Tablet By Mouth Daily. 3)  Lantus 100 Unit/ml  Soln (Insulin Glargine) .... Take 15  Units Once Every Night 4)  Ferrous Sulfate 325 (65 Fe) Mg  Tbec (Ferrous Sulfate) .... Take One Pill Two Times A Day 5)  Proair Hfa 108 (90 Base) Mcg/act  Aers (Albuterol Sulfate) .... Take 1-2 Puffs Once Every 4-6 Hour As Needed For Shortness of Breath 6)  Nasonex 50 Mcg/act Susp (Mometasone Furoate) .... Use 2 Spray in Each Nostril. 7)  Allegra 180 Mg Tabs (Fexofenadine Hcl) .... Take 1 Tablet By Mouth Daily.  Allergies: 1)  ! Trimethoprim (Trimethoprim) 2)  ! Sulfamethoxazole  Review of Systems  The patient denies fever, weight loss, chest pain, syncope, dyspnea on exertion, peripheral edema, prolonged cough, headaches, hemoptysis, melena, hematochezia, and severe indigestion/heartburn.    Physical Exam  General:  alert, well-developed, and well-nourished.   Head:  normocephalic and atraumatic.   Lungs:  normal respiratory effort, no intercostal retractions, no accessory muscle use, and normal breath  sounds.   Heart:  normal rate and regular rhythm.   Abdomen:  soft, normal bowel sounds, no distention, no masses, no guarding, no rigidity, and no rebound tenderness.  mild tenderness right and left upper quadrant.    Impression & Recommendations:  Problem # 1:  DIABETES MELLITUS, TYPE I (ICD-250.01) She is still having episodes of low blood sugar. This is due to exercising and decrease food intake. I will decrease lantus to 15 units. Next visit will consider SSI.  The following medications were removed from the medication list:    Novolog 100 Unit/ml Soln (Insulin aspart) .[name]...  1 unit per 15 grams of carbs Her updated medication list for this problem includes:    Lantus 100 Unit/ml Soln (Insulin glargine) .[name]... Take 15  units once every night  The following medications were removed from the medication list:    Novolog 100 Unit/ml Soln (Insulin aspart) .[name]... 1 unit per 15 grams of carbs Her updated medication list for this problem includes:    Lantus 100 Unit/ml Soln (Insulin glargine) .[name]... Take 15  units once every night  Problem # 2:  ATOPIC RHINITIS (ICD-477.9) I will prescribe nasonex for rhinitis.  The following medications were removed from the medication list:    Oxymetazoline Hcl 0.05 % Soln (Oxymetazoline hcl) .[name]... Take 2 sprays per nostril twice a day Her updated medication list for this problem includes:    Nasonex 50 Mcg/act Susp (Mometasone furoate) ..... Use 2 spray in each nostril.    Allegra 180 Mg Tabs (Fexofenadine hcl) .[name]... Take 1 tablet by mouth daily.    Promethazine Hcl 12.5 Mg Tabs (Promethazine hcl) .[name]... Take 1 tablet by mouth every 8 hour as needed for nausea.  Her updated medication list for this problem includes:    Oxymetazoline Hcl 0.05 % Soln (Oxymetazoline hcl) .[name]... Take 2 sprays per nostril twice a day    Nasonex 50 Mcg/act Susp (Mometasone furoate) ..... Use 2 spray in each nostril.    Allegra 180 Mg Tabs (Fexofenadine hcl) .[name]... Take 1 tablet by mouth daily.  Problem # 3:  ABDOMINAL PAIN (ICD-789.00) This is likely gastroenteritis. I will check lipase, amylase, H pylori, and hemoccult. Abdominal exam was benign. I will prescribe protonix and phenerga. I will hold reglan due to diarrhea.  The following medications were removed from the medication list:    Reglan 5 Mg Tabs (Metoclopramide hcl) .[name]... Take one tab by mouth 30 minutes before every meal and before going to sleep  Orders: T-Hepatic Function [id number]) T-Lipase [id number]) T-Culture, Stool ([id number]) T-H Pylori AG, EIA ([id number]) T-Hemoccult  Card-Multiple (take home) (82270)  Complete Medication List: 1)  Amitriptyline Hcl 50 Mg Tabs (Amitriptyline hcl) .... Take one at bed time 2)  Synthroid 75 Mcg Tabs (Levothyroxine sodium) .... Take 1 tablet by mouth daily. 3)  Lantus 100 Unit/ml Soln (Insulin glargine) .... Take 15  units once every night 4)  Ferrous Sulfate 325 (65 Fe) Mg Tbec (Ferrous sulfate) .... Take one pill two times a day 5)  Proair Hfa 108 (90 Base) Mcg/act Aers (Albuterol sulfate) .... Take 1-2 puffs once every 4-6 hour as needed for shortness of breath 6)  Nasonex 50 Mcg/act Susp (Mometasone furoate) .... Use 2 spray in each nostril. 7)  Allegra 180 Mg Tabs (Fexofenadine hcl) .... Take 1 tablet by mouth daily. 8)  Protonix 40 Mg Tbec (Pantoprazole sodium) .... Take 1 tablet by mouth daily. 9)  Promethazine Hcl 12.5 Mg Tabs (Promethazine hcl) .... Take 1  tablet by mouth every 8 hour as needed for nausea.  Patient Instructions: 1)  Please schedule a follow-up appointment in 3 weeks. 2)  Please check blood sugar three times a day. 3)  Do not skip meals. 4)  If you have any low blood sugar please let us know.  5)  teh main problem with gastroenteritis is dehydration. Drink plenty of fluids and take solids as you feel better. If you are unable to keep anything down and/or you show signs of dehydration(dry/cracked lips, lack of tears, not urinating, very sleepy), call our office. Prescriptions: PROMETHAZINE HCL 12.5 MG TABS (PROMETHAZINE HCL) Take 1 tablet by mouth every 8 hour as needed for nausea.  #30 x 1   Entered and Authorized by:   Niel Hummer MD   Signed by:   Niel Hummer MD on 04/28/2010   Method used:   Print then Give to Patient   RxID:   [id number] PROTONIX 40 MG TBEC (PANTOPRAZOLE SODIUM) Take 1 tablet by mouth daily.  #30 x 2   Entered and Authorized by:   Niel Hummer MD   Signed by:   Niel Hummer MD on 04/28/2010   Method used:   Print then Give to Patient   RxID:    [id number] ALLEGRA 180 MG TABS (FEXOFENADINE HCL) Take 1 tablet by mouth daily.  #30 x 3   Entered and Authorized by:   Niel Hummer MD   Signed by:   Niel Hummer MD on 04/28/2010   Method used:   Print then Give to Patient   RxID:   [id number] NASONEX 50 MCG/ACT SUSP (MOMETASONE FUROATE) use 2 spray in each nostril.  #1 x 3   Entered and Authorized by:   Niel Hummer MD   Signed by:   Niel Hummer MD on 04/28/2010   Method used:   Print then Give to Patient   RxID:   [id number]  Process Orders Check Orders Results:     [facility]: ABN not required for this insurance Tests Sent for requisitioning (Apr 29, 2010 8:23 AM):     04/28/2010: [facility] -- T-Hepatic Function [phone] (signed)     04/28/2010: [facility] -- T-Lipase [phone] (signed)     04/28/2010: [facility] -- T-Culture, Stool [[id number]] (signed)     04/28/2010: [facility] -- T-H Pylori AG, EIA [id number] (signed)   Prescriptions: PROMETHAZINE HCL 12.5 MG TABS (PROMETHAZINE HCL) Take 1 tablet by mouth every 8 hour as needed for nausea.  #30 x 1   Entered and Authorized by:   Niel Hummer MD   Signed by:   Niel Hummer MD on 04/28/2010   Method used:   Print then Give to Patient   RxID:   [id number] PROTONIX 40 MG TBEC (PANTOPRAZOLE SODIUM) Take 1 tablet by mouth daily.  #30 x 2   Entered and Authorized by:   Niel Hummer MD   Signed by:   Niel Hummer MD on 04/28/2010   Method used:   Print then Give to Patient   RxID:   [id number] ALLEGRA 180 MG TABS (FEXOFENADINE HCL) Take 1 tablet by mouth daily.  #30 x 3   Entered and Authorized by:   Niel Hummer MD   Signed by:   Niel Hummer MD on 04/28/2010   Method used:   Print then Give to Patient   RxID:   [id number] NASONEX 50 MCG/ACT SUSP (MOMETASONE FUROATE) use 2 spray in each nostril.  #1 x 3  Entered  and Authorized by:   Niel Hummer MD   Signed by:   Niel Hummer MD on 04/28/2010   Method used:   Print then Give to Patient   RxID:   [id number]    Process Orders Check Orders Results:     [facility]: [id number] not required for this insurance Tests Sent for requisitioning (Apr 29, 2010 8:23 AM):     04/28/2010: [facility] -- T-Hepatic Function [phone] (signed)     04/28/2010: [facility] -- T-Lipase [phone] (signed)     04/28/2010: [facility] -- T-Culture, Stool [[id number]] (signed)     04/28/2010: [facility] -- T-H [place], [place] (signed)

## 2011-01-18 NOTE — Progress Notes (Signed)
Summary: ems/ hla  Phone Note Call from Patient   Summary of Call: pt calls c/o ems being called to her home this am for unresponsiveness by family member, pt states her blood sugar was 29, she cannot tell me what ems did and states they did not transport her to ed and she did not refuse transport. she states paramedic told her not to take anymore insulin until she is seen in clinic, she then goes on to say she also started having chest pain at that time and felt short of breath, she states the paramedic told her the chest pain and sob would suside but she feels it is getting worse, she is then instructed to call 911 or have someone drive her to ed, a f/u appt is made for 5/4 also.  Initial call taken by: Freddy Finner RN,  Apr 21, 2010 5:11 PM

## 2011-01-20 NOTE — Progress Notes (Signed)
Summary: Refill/gh  Phone Note Refill Request Message from:  Fax from Pharmacy on January 12, 2011 2:34 PM  Refills Requested: Medication #1:  LANTUS 100 UNIT/ML  SOLN Take 15  units once every night   Dosage confirmed as above?Dosage Confirmed Pt is at the pharmacy.   Method Requested: Fax to [facility] Initial call taken by: Sander Nephew RN,  January 12, 2011 2:35 PM  Follow-up for Phone Call       Follow-up by: Lester Lake Summerset MD,  January 12, 2011 4:45 PM    Prescriptions: LANTUS 100 UNIT/ML  SOLN (INSULIN GLARGINE) Take 15  units once every night  #3 month supp x 1   Entered and Authorized by:   Lester Starkweather MD   Signed by:   Lester Gulf MD on 01/12/2011   Method used:   Faxed to ...       [facility] (retail)       [address] [place]       [place], [place]  [address]       Ph: [phone]       Fax: [phone]   RxID:   [id number]

## 2011-01-20 NOTE — Progress Notes (Signed)
Summary: refill/ hla  Phone Note Refill Request Message from:  Fax from Pharmacy on November 30, 2010 11:06 AM  Refills Requested: Medication #1:  SYNTHROID 75 MCG TABS Take 1 tablet by mouth daily.   Dosage confirmed as above?Dosage Confirmed   Last Refilled: 9/15 Initial call taken by: Freddy Finner RN,  November 30, 2010 11:07 AM  Follow-up for Phone Call        Please ask the patient to come in for appointment. Will refill for 1 month Follow-up by: Lester Pinckard MD,  November 30, 2010 11:16 AM    Prescriptions: SYNTHROID 75 MCG TABS (LEVOTHYROXINE SODIUM) Take 1 tablet by mouth daily.  #31 x 0   Entered and Authorized by:   Lester Caldwell MD   Signed by:   Lester Waller MD on 11/30/2010   Method used:   Telephoned to ...       [facility] (retail)       [id number] [place]       [place], [place]  [address]       Ph: [phone]       Fax: [phone]   RxID:   [id number]

## 2011-01-27 ENCOUNTER — Other Ambulatory Visit: Payer: Self-pay | Admitting: *Deleted

## 2011-01-31 MED ORDER — ALBUTEROL SULFATE HFA 108 (90 BASE) MCG/ACT IN AERS: INHALATION_SPRAY | RESPIRATORY_TRACT | Status: DC

## 2011-03-01 LAB — GLUCOSE, CAPILLARY
Glucose-Capillary: 262 mg/dL — ABNORMAL HIGH (ref 70–99)
Glucose-Capillary: 434 mg/dL — ABNORMAL HIGH (ref 70–99)

## 2011-03-01 LAB — CBC
HCT: 33.1 % — ABNORMAL LOW (ref 36.0–46.0)
MCH: 28.5 pg (ref 26.0–34.0)
MCV: 85.8 fL (ref 78.0–100.0)
Platelets: 176 10*3/uL (ref 150–400)
RDW: 12.4 % (ref 11.5–15.5)

## 2011-03-01 LAB — URINALYSIS, ROUTINE W REFLEX MICROSCOPIC
Ketones, ur: >80 mg/dL — AB
Leukocytes, UA: NEGATIVE
Nitrite: NEGATIVE
Protein, ur: NEGATIVE mg/dL
Urobilinogen, UA: 0.2 mg/dL (ref 0.0–1.0)

## 2011-03-01 LAB — DIFFERENTIAL
Basophils Absolute: 0 10*3/uL (ref 0.0–0.1)
Eosinophils Absolute: 0 10*3/uL (ref 0.0–0.7)
Eosinophils Relative: 0 % (ref 0–5)
Lymphs Abs: 1.9 10*3/uL (ref 0.7–4.0)
Monocytes Absolute: 0.4 10*3/uL (ref 0.1–1.0)

## 2011-03-01 LAB — POCT I-STAT 3, ART BLOOD GAS (G3+)
Patient temperature: 98.7
TCO2: 25 mmol/L (ref 0–100)
pH, Arterial: 7.443 — ABNORMAL HIGH (ref 7.350–7.400)

## 2011-03-01 LAB — BASIC METABOLIC PANEL
BUN: 7 mg/dL (ref 6–23)
CO2: 26 mEq/L (ref 19–32)
Chloride: 94 mEq/L — ABNORMAL LOW (ref 96–112)
Creatinine, Ser: 1.08 mg/dL (ref 0.4–1.2)

## 2011-03-02 LAB — POCT I-STAT, CHEM 8
Calcium, Ion: 1.21 mmol/L (ref 1.12–1.32)
Chloride: 96 mEq/L (ref 96–112)
Creatinine, Ser: 0.8 mg/dL (ref 0.4–1.2)
Glucose, Bld: 459 mg/dL — ABNORMAL HIGH (ref 70–99)
HCT: 35 % — ABNORMAL LOW (ref 36.0–46.0)
Potassium: 4 mEq/L (ref 3.5–5.1)

## 2011-03-08 LAB — CBC
HCT: 31.5 % — ABNORMAL LOW (ref 36.0–46.0)
Hemoglobin: 10.6 g/dL — ABNORMAL LOW (ref 12.0–15.0)
MCHC: 33.5 g/dL (ref 30.0–36.0)
MCV: 86.6 fL (ref 78.0–100.0)
Platelets: 185 10*3/uL (ref 150–400)
RDW: 13 % (ref 11.5–15.5)

## 2011-03-08 LAB — DIFFERENTIAL
Basophils Absolute: 0 10*3/uL (ref 0.0–0.1)
Eosinophils Absolute: 0 10*3/uL (ref 0.0–0.7)
Eosinophils Relative: 0 % (ref 0–5)
Lymphocytes Relative: 28 % (ref 12–46)
Monocytes Absolute: 0.3 10*3/uL (ref 0.1–1.0)

## 2011-03-08 LAB — BASIC METABOLIC PANEL
BUN: 6 mg/dL (ref 6–23)
CO2: 29 mEq/L (ref 19–32)
Chloride: 102 mEq/L (ref 96–112)
GFR calc non Af Amer: >60 mL/min (ref >60)
Glucose, Bld: 335 mg/dL — ABNORMAL HIGH (ref 70–99)
Potassium: 3.7 mEq/L (ref 3.5–5.1)
Sodium: 135 mEq/L (ref 135–145)

## 2011-03-08 LAB — D-DIMER, QUANTITATIVE: D-Dimer, Quant: 0.24 ug/mL-FEU (ref 0.00–0.48)

## 2011-03-08 LAB — GLUCOSE, CAPILLARY: Glucose-Capillary: 137 mg/dL — ABNORMAL HIGH (ref 70–99)

## 2011-03-09 ENCOUNTER — Other Ambulatory Visit: Payer: Self-pay | Admitting: *Deleted

## 2011-03-09 MED ORDER — LEVOTHYROXINE SODIUM 75 MCG PO TABS: 75.0000 ug | ORAL_TABLET | Freq: Every day | ORAL | Status: DC

## 2011-03-10 NOTE — Telephone Encounter (Signed)
Synthroid refill request form faxed to [facility] .

## 2011-03-18 ENCOUNTER — Ambulatory Visit: Payer: Self-pay | Admitting: Internal Medicine

## 2011-03-29 LAB — BASIC METABOLIC PANEL
CO2: 30 mEq/L (ref 19–32)
Calcium: 9.1 mg/dL (ref 8.4–10.5)
Creatinine, Ser: 0.64 mg/dL (ref 0.4–1.2)
GFR calc Af Amer: >60 mL/min (ref >60)
GFR calc non Af Amer: >60 mL/min (ref >60)
Glucose, Bld: 122 mg/dL — ABNORMAL HIGH (ref 70–99)
Sodium: 141 mEq/L (ref 135–145)

## 2011-03-29 LAB — URINALYSIS, ROUTINE W REFLEX MICROSCOPIC
Glucose, UA: NEGATIVE mg/dL
Protein, ur: NEGATIVE mg/dL
Specific Gravity, Urine: 1.008 (ref 1.005–1.030)
Urobilinogen, UA: 1 mg/dL (ref 0.0–1.0)

## 2011-03-29 LAB — GLUCOSE, CAPILLARY: Glucose-Capillary: 130 mg/dL — ABNORMAL HIGH (ref 70–99)

## 2011-03-29 LAB — DIFFERENTIAL
Basophils Absolute: 0 10*3/uL (ref 0.0–0.1)
Basophils Relative: 0 % (ref 0–1)
Lymphocytes Relative: 19 % (ref 12–46)
Neutro Abs: 2.7 10*3/uL (ref 1.7–7.7)
Neutrophils Relative %: 68 % (ref 43–77)

## 2011-03-29 LAB — TSH: TSH: 17.157 u[IU]/mL — ABNORMAL HIGH (ref 0.350–4.500)

## 2011-03-29 LAB — CBC
MCHC: 33.3 g/dL (ref 30.0–36.0)
RBC: 3.63 MIL/uL — ABNORMAL LOW (ref 3.87–5.11)
RDW: 13.3 % (ref 11.5–15.5)

## 2011-05-03 NOTE — Discharge Summary (Signed)
NAMEZINIYAH, YORKER NO.:  [id number]   MEDICAL RECORD NO.:  [id number]          PATIENT TYPE:  INP   LOCATION:  [id number]                         FACILITY:  [facility]   PHYSICIAN:  Evette Doffing, M.D.  DATE OF BIRTH:  November 06, 1986   DATE OF ADMISSION:  12/09/2007  DATE OF DISCHARGE:  12/10/2007                               DISCHARGE SUMMARY   DISCHARGE DIAGNOSES:  1. Hyperglycemia.  2. Ibuprofen overuse.  3. Headache, probably tension, resolved.  4. Hyponatremia secondary to hyperglycemia.  5. Hyperkalemia, resolved.  6. Anemia.  7. Hypothyroidism.  8. Depression.  9. Tobacco.   DISCHARGE MEDICATION:  1. Lantus 31 units daily subcutaneously.  2. NovoLog 1 unit per 15 units of carb.  3. Synthroid 50 mcg daily.  4. Ferrous sulfate 325 mg daily.  5. Elavil 50 mg daily.   PROCEDURES PERFORMED:  None.   CONSULTANTS:  None.   BRIEF ADMITTING H&P:  Ms Ohlsen is a 25 year old with past medical  history of Graves' disease status post ablation, now hypothyroid, type 1  diabetes and depression.  She came to the ED after taking 10-20  ibuprofen overnight for a severe headache. She complained of abdominal  pain but denied vomiting or diarrhea. She also complained of polydipsia  and polyuria.  She denied shortness of breath, cough, dysuria, fever,  chills, photophobia. In the ED, she was noted to have a glucose > 700  and was admitted for uncontrolled DM as well as potental ibuprofen  toxicity.   PHYSICAL EXAM:  Temperature 97.1, blood pressure 111/76, pulse of 97,  respirations 16 and she is sating 98% on room air.  GENERAL:  No acute distress.  MOUTH:  Dry mucous membrane.  No erythema or exudate  LUNGS:  Clear to auscultation with good air movement  Cardiac: regular rate and rhythm.  ABDOMEN:  She is a little bit tender in the epigastrium but otherwise  nontender, nondistended, soft with positive bowel sounds.   LAB:  White blood cell 5.7, hemoglobin 10.8,  platelet 172,000, MCV of  86.4.  Sodium 122, potassium 5.3, chloride 93, bicarbonate 26, BUN 17,  creatinine 1.1, glucose more than 700.  Chest x-ray showed no acute cardiopulmonary disease.   HOSPITAL COURSE:  1. Hyperglycemia.  Patient was started on an insulin drip and was      hydrated aggressively.  Ketones were negative and bicarb was 26,      ruling out DKA.  By the next day,  her glucose was around 150s to      170s.  She was sent home on Lantus.  2. Ibuprofen overuse.  Patient denied suicidal ideation and stated she      was only taking the ibuprofen to get rid of her headache. Patient      was advised of the consequence of taking excess doses of ibuprofen.      On admission, salicylate and acetaminophen levels were checked as      well as alcohol level and UDS. All were negative.  Also, serum      osmolarity were also sent because of the  possibility of ingestion      of other drugs. This was normal.  An EKG was done that showed no      prolonged QT. This was thought to be an unintentional ingestion      taken only because of her headache.  She was advised to avoid      taking ibuprofen in excess.  3. Headache. Improved. Treated with acetominphen.  4. Hyponatremia, secondary to hyperglycemia. Sodium was normal when      corrected for the glucose.  5. Hyperkalemia.  This was probably related to her hyperglycemia.      After starting on an insulin drip her potassium came down to within      normal limits.  6. Iron deficiency anemia.  FOBT was negative.  Her hemoglobin on      admission and at last clinic visit was 10.8.  After hydration, it      came down to 8.9.  This will be followed up as an outpatient.  7. Hypothyroidism.  Continue synthroid.  8. Depression.  Stable with no suicidal ideation.   On the day of discharge her labs were sodium 137, potassium 3.7,  chloride 111, bicarbonate 21, glucose 325, BUN 3, creatinine 0.5.  Fasting lipid panel:  Cholesterol 154,  triglycerides 62, HDL 48, LDL 94,  VLDL 12, white blood cells 4.9, hemoglobin 8.9 and platelets 140,000.   VITALS ON THE DAY OF DISCHARGE:  Temperature 98.4, pulse 94,  respirations 18, blood pressure 109/76, she was sating 93% on room air.      Charlynne Cousins, M.D.  Electronically Signed      Evette Doffing, M.D.  Electronically Signed    AF/MEDQ  D:  12/10/2007  T:  12/10/2007  Job:  [id number]

## 2011-05-03 NOTE — Discharge Summary (Signed)
[name], [name] NO.:  [id number]   MEDICAL RECORD NO.:  [id number]          PATIENT TYPE:  INP   LOCATION:  5508                         FACILITY:  [facility]   PHYSICIAN:  Evette Doffing, M.D.  DATE OF BIRTH:  12-26-1985   DATE OF ADMISSION:  04/29/2008  DATE OF DISCHARGE:  05/03/2008                               DISCHARGE SUMMARY   DISCHARGE DIAGNOSES:  1. Diabetic ketoacidosis.  2. Graves disease with hypothyroidism secondary to radioactive iodine      ablation performed in 2005.  3. Asthma.  4. Tobacco abuse.  5. History of major depression status post 2 suicide attempts.  6. Recurrent abscesses.  7. Iron deficiency anemia secondary to menstruation.  8. Gender identity disorder.  9. Gastritis.  10.Iron deficiency anemia with baseline hemoglobin 9-10.   DISCHARGE MEDICATIONS:  1. Lantus 30 units subcu nightly.  2. NovoLog subcu sliding scale.  3. Iron 325 mg p.o. twice daily.  4. Levothyroxine 50 mcg daily.  5. Albuterol 90 mcg MDI every 4 hours as needed for shortness of      breath.  6. Protonix 40 mg p.o. twice daily for 1 month.  7. Percocet 5/325 1 tablet every 8 hours as needed for pain.  8. Compazine 10 mg every 6 hours as needed for nausea.   HOSPITAL FOLLOWUP:  The patient will followup within the next 3 weeks  with [facility].  She will need to  have her abdominal pain from her gastritis followed upon in addition to  her diabetes management.   PROCEDURE PERFORMED:  1. Ultrasound of the abdomen was performed on May 01, 2008, indicating      small amount of ascites present.  No evidence of pancreatic      enlargement or mass.  No focal hepatic lesions seen.  Gallbladder      wall thickness is normal.  No evidence of cholelithiasis.  2. Acute abdominal series performed on May 01, 2008, indicating normal      bowel gas pattern.  No active cardiopulmonary process is seen.  CT      of the abdomen  performed on May 01, 2008, indicating small      bilateral pleural effusion with mild ascites.  No other abnormality      found.   CONSULTATIONS:  We consulted gastroenterology because of the patient's  severe abdominal pain that she experienced on May 01, 2008.  The EGD was  performed that confirmed acute gastritis.   BRIEF ADMITTING HISTORY AND PHYSICAL:  This is a 25 year old female with  a history of diabetes type 1 diagnosed 8 years ago.  Hospitalized  several times for DKA, also with a history of Graves disease, asthma,  and major depression.  A week long history of feeling weak and vomiting  frequently.  She has also ran out of her Lantus within the last week.  Before this, she was in her usual state of health.  She has been taking  extra sliding scale insulin to make up for her Lantus, thus not been  taking her CBGs.  She has been simply trying to use sliding scale based  on the number of carb she is taking in per meal.  She recently tried to  get her Lantus still within the outpatient clinic, however, she felt  that it would take a few more days to get the medication in for her so  she has been with that for 6 days.  Her nausea and vomiting in the  meanwhile has gotten worse in the last 2 days.  She also reports  hematemesis today.  She states she has chronic abdominal pain, but  denies any melena or hematochezia.   PHYSICAL EXAMINATION:  ADMITTING VITALS:  Temperature 96.8, blood  pressure 111/73, pulse 127, respirations 32, and sating 98% on room air.  GENERAL:  The patient is alert and oriented not in any acute distress.  EYES:  Pupils equally round reactive to light, anicteric.  Extraocular  movements are intact.  ENT:  Dry mucous membranes.  Fair dentition.  NECK:  Supple without any lymphadenopathy.  LUNGS:  Clear bilaterally with good air movement.  CARDIOVASCULAR:  Tachy with a regular rhythm.  No murmurs, rubs, or  gallops.  GI:  Soft and nontender with bowel  sounds.  EXTREMITIES:  No edema found.  SKIN:  Warm and dry.  NEUROLOGIC:  The patient has a nonfocal neuro exam with cranial nerves  II through XII intact.  Muscle strength 5/5 in all extremities.  PSYCH:  Appropriate.   ADMITTING LABS:  Sodium 132, potassium 6.1, chloride 97, bicarb 14, BUN  10, creatinine 1.44, and glucose 715.  UA shows more than 1000 glucose  with more than 80 ketones, spec grav 1.031.  Urine pregnancy test  negative.  ABG shows a pH 7.285, bilirubin is 1.0, alk phos is 82, AST  21, ALT 16, protein 6.7, albumin 3.6, calcium 9.9, and lipase 14.   HOSPITAL COURSE:  1. Diabetic ketoacidosis.  The patient came in significantly acidotic.      She had a very high glucose and evidence of ketones in her urine.      Her pH on her ABG confirmed her acidemia.  She was admitted to the      intensive care unit given two 1 liter boluses of normal saline and      then 500 mL boluses over an hour x2.  She was then started on 250      mL with normal saline for an hour.  We obtained q. 2 h BMPs in      addition to q. h CBGs.  We will also place the patient on an      insulin drip and gave her D5 once her CBGs fell below 200.  Her CBG      did fall precipitously at first, however, the D5 was able to      stabilize as her CBGs as her bicarb corrected and her acidemia      resolved.  Within 24 hours, her DKA had resolved.  We were able to      transfer the patient to a regular floor after that.  We replaced      the patient's potassium with p.o. KCl during this time.  After the      first 36 hours of the patient's admission, we placed her back on      Lantus and her home dose of sliding scale insulin.  2. Acute renal failure.  The patient came in with the creatinine of  1.44.  Her creatinine is normal at 0.7.  A urinary sodium and      creatinine level is 2.4%.  Despite this, the patient's creatinine      did come down with IV hydration and was just down of 0.7 during the       course of her admission.  Again this is baseline for her.  3. Gastritis.  The patient had significant abdominal pain, nausea, and      vomiting during her hospitalization.  Her abdominal pain became      worse on May 01, 2008.  She was found in the morning in severe pain      with nausea and vomiting.  Her abdominal exam showed a nondistended      abdomen with an abdomen that was very tender in the epigastrium and      with very few bowel sounds.  An EGD was performed that confirmed      acute hemorrhagic gastritis.  We placed the patient on Protonix 40      mg p.o. twice daily and advanced her diet slowly.  We provided      Compazine for her nausea and morphine for her pain.  We were able      to try this in the patient to a p.o. pain medicine regimen.  We      discharged her with Percocet, Compazine, and Protonix.  Follow up      in the outpatient clinic in regards to her problems.  Her risk      factors for gastritis include overuse of ibuprofen in the past when      she tried to commit suicide by overdosing on ibuprofen.  She does      not have a history of alcoholism.  4. Chronic headaches.  The patient was given Tylenol as needed for her      headache.  She has tension headaches and she gets them quite      frequently.  5. Hypothyroidism.  The patient's TSH was 1.476.  I will continue her      home dose of Synthroid at 50 mcg daily.  She is stable in regards      to this problem.  6. Depression.  The patient has a history of major depression.  She is      currently on amitriptyline.  She denies any attempt to try to hurt      herself passively or actively.  She does not have any current      worsening of her depression.  I think she is stable in regard to      this problem.  7. Iron deficiency anemia.  The patient's hemoglobin dropped from 12      to 9.5 after IV hydration.  She is menstruating female and she has      had a history of iron deficiency anemia.  She was a guaiac       positive, however, I do believe is her menstruation that causes her      iron deficiency; however the case, she needs p.o., ferrous sulfate      and we counseled her on the importance of taking this in the      future.   DISCHARGE LABS:  Hemoglobin 9.3, white count 4.5, and platelet count  112.   DISCHARGE VITALS:  Temperature 98.6, blood pressure 126/86, pulse 101,  respirations 22, and sating 100% on room air.      Angela Serene  Marshell Levan, MD  Electronically Signed      Evette Doffing, M.D.  Electronically Signed    CB/MEDQ  D:  05/03/2008  T:  05/04/2008  Job:  [id number]
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
## 2011-05-06 NOTE — H&P (Signed)
Angela [name], [name] NO.:  [id number]   MEDICAL RECORD NO.:  [id number]          PATIENT TYPE:  EMS   LOCATION:  [facility]                         FACILITY:  [facility]   PHYSICIAN:  Ashby Dawes. Polite, M.D. DATE OF BIRTH:  11/14/1986   DATE OF ADMISSION:  11/09/2004  DATE OF DISCHARGE:                                HISTORY & PHYSICAL   CHIEF COMPLAINT:  Nausea, vomiting, and diarrhea x 1 day.   HISTORY OF PRESENT ILLNESS:  Angela [name] is a 25 year old female with known  history of type 1 diabetes admitting to poor compliance who presented to the  ED with a one day history of nausea, vomiting, inability to keep anything  down, and diarrhea.  The patient states she was in her usual state of health  until yesterday.  The patient stated she had been consuming some alcohol,  gin or vodka, the patient states she awoke this morning with persistent  nausea, vomiting and diarrhea.  The patient states that over the last couple  of weeks to a month, she has not been taking care of her diabetes.  She does  take her medicines but has not checked her CBGs.  Occasionally, she does  admit to missing her meds, as well.  The patient denies any history of DKA.  She does admit to significant medical noncompliance.  Again, prior to  yesterday, the patient denied any trouble with any fever, chills, nausea and  vomiting.  In the ED, the patient is evaluated and found to have an elevated  CBG with significant sinus tachycardia.  The patient was found to be in DKA  and was treated with IV fluids and IV insulin and [facility] hospitalist called  for further evaluation.   PAST MEDICAL HISTORY:  Significant for type 1 diabetes with poor medical  noncompliance.  Goiter per patient being evaluated for possible  hyperthyroidism.   MEDICATIONS ON ADMISSION:  Humalog 75/25, 42 units in the a.m., 52 units in  the p.m.   SOCIAL HISTORY:  Positive for tobacco, half pack per day.  Positive alcohol,  mainly  weekend binge drinker.  Positive marajuana.   PAST SURGICAL HISTORY:  None.   ALLERGIES:  None.   FAMILY HISTORY:  Mother with  hypothyroid, unsure about her father, one  sister who is healthy.   REVIEW OF SYMPTOMS:  As stated per HPI.   PHYSICAL EXAMINATION:  GENERAL:  The patient is in moderate distress secondary to DKA.  VITAL SIGNS:  Temperature 97.2, blood pressure 124/80, pulse 150,  respiratory rate 22.  HEENT:  Dry oral mucosa.  Pupils equal, round, reactive to light, anicteric  sclerae.  NECK:  No JVD, no nodes.  Significant goiter.  LUNGS:  Clear to auscultation bilaterally.  HEART:  Sinus tach.  ABDOMEN:  Soft, nontender, no hepatosplenomegaly.  EXTREMITIES:  No edema.  RECTAL:  Deferred.  NEUROLOGICAL:  Nonfocal.   LABORATORY DATA:  BMP with sodium 130, potassium 4.8, chloride 103, BUN 18,  creatinine 0.7, AST and ALT 28 and 23 respectively, bilirubin 1.7.  Alcohol  level less than 5.  Amylase 15.  UA spec. grav. 1.023, glucose greater than  1000, ketones greater than 80, leukocyte esterase negative, nitrite  negative, white blood cells 0-2.  Urine drug screen negative.  Chest x-ray  no apparent disease.   ASSESSMENT:  1.  DKA.  2.  Type 1 diabetes with poor medical compliance.  3.  Goiter, rule out thyroid storm  4.  Nausea, vomiting, diarrhea.  5.  Sinus tachycardia.  6.  Depression.  7.  Medical noncompliance.  8.  Elevated bilirubin.   RECOMMENDATIONS:  The patient be admitted to a step down unit and treated  with aggressive IV fluids, IV insulin, and replete electrolytes as needed,  rule out infectious etiology as the cause of the patient's nausea, vomiting,  and diarrhea.  As the patient has elevated bilirubin, we will repeat levels  in the morning, if persistent elevation, check an ultrasound to rule out  gallbladder disease.  As the patient has a goiter on exam, rule out thyroid  storm as a contributing factor to her current illness.  Will make  further  recommendations after review of the above studies.      Ronalee Red   RDP/MEDQ  D:  11/09/2004  T:  11/09/2004  Job:  [id number]
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
## 2011-05-06 NOTE — Discharge Summary (Signed)
NAMESUMMER, ROCCIA              ACCOUNT NO.:  [id number]   MEDICAL RECORD NO.:  [id number]          PATIENT TYPE:  INP   LOCATION:  6707                         FACILITY:  [facility]   PHYSICIAN:  Thomes Lolling, M.D.    DATE OF BIRTH:  [birth year]   DATE OF ADMISSION:  07/12/2006  DATE OF DISCHARGE:  07/15/2006                                 DISCHARGE SUMMARY   DISCHARGE DIAGNOSES:  1. Nausea, vomiting secondary to diabetic ketoacidosis-resolved.  2. Type 1 diabetes mellitus.  3. Major depressive disorder.  4. 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  5. Mild iron deficiency anemia.  6. History of Grave disease status post radioactive iodine on thyroid      replacement.   DISCHARGE CONDITION:  Stable.   DISCHARGE MEDICATIONS:  1. Lantus 32 units subcutaneously at bedtime.  2. NovoLog Flex Pen with 1 gram carbohydrates per 15 units of insulin.      Patient to self administer via scale provided by diabetes educator.      Recommend 3 units with meals to begin with.  3. Wellbutrin XL 150 mg one tablet daily.  Patient to increase dose to      b.i.d. in approximately three days with followup by primary care      physician and outpatient psychiatric and therapy counseling.  4. Ferrous sulfate 325 mg one tablet p.o. b.i.d.   DISCHARGE FOLLOWUP:  Hospital followup with Dr. Patrice Paradise on July 28, 2006 at  3:30 p.m.  Also, next available appointment with Barnabas Harries, diabetes  educator.  Outpatient mental health counseling services either at [facility] or [facility].  The patient  was advised by psychiatry to seek this counseling.   CONSULTATIONS:  Dr. Felizardo Hoffmann, inpatient psychiatry.  Date of consult  July 15, 2006.   PROCEDURES:  None.   HISTORY OF PRESENT ILLNESS:  Ms. Bliss is a 25 year old African-American  woman recently discharged two weeks ago for a diabetic ketoacidosis who  presented with three-day  history of nausea, vomiting, and dizziness.  The  patient states that two days prior to admission she was feeling bad and had  vomiting episodes, and one day prior to admission at least two separate  times.  The night prior to admission the symptoms became so much worse that  the patient did end up checking her blood sugar level which was well over  400.  She self administered 10 units of NovoLog and drank some water, but  when her symptoms did not get relieved she presented to the emergency room.  On admission, the patient's blood sugar was 532 with anion gap of 19.  The  patient denies any recent alcohol or methanol or ethylene glycol.   PAST MEDICAL HISTORY:  1. Grave disease status post radioactive iodine in 2005.  2. Recent staph abscess to the skin of the arm pit, inner thigh, and vulva      region, now resolved after doxycycline treatment.  3. Major depressive disorder status post suicide attempts x2, most      recently in September of  2006.  4. Status post right metatarsal fusion for bunion.  5. Type 1 diabetes.   HOME MEDICATIONS:  Synthroid 75 mcg daily, doxycycline 100 mg daily as part  of a taper, and 32 units of Lantus subcutaneously at bedtime, NovoLog  sliding scale.   ADMISSION PHYSICAL EXAMINATION:  GENERAL:  Temperature 97.9, blood pressure  109/66, pulse 80, respirations 14, O2 sat 99% on room air.  ABDOMEN:  Examination was essentially only remarkable for tenderness to the  right upper quadrant with bowel sounds.  There was no hepatosplenomegaly.  No rebound or guarding however.  GENITOURINARY EXAMINATION:  Showed a previous left labial abscess which now  has eschar.  No acute drainage noted.  Also, previous right buttocks abscess  which was nontender and nonerythematous, and a left axillary abscess which  was also well-healed.   ADMISSION LABORATORY DATA:  Sodium 132, potassium 4.7, chloride 95, bicarb  18, BUN 14, creatinine 1.2, glucose 532.  Anion gap 19,  total bilirubin 1.5,  alk phos 109, AST 16, ALT 14, albumin 4.1, calcium 10, protein 7.3, amylase  16, lipase 17.  Ketones to the blood which were small.  Urinalysis:  Specific gravity 1.034, pH 5.5, glucose greater than 1,000 with moderate  hemoglobin, ketones less than 80, 0-2 white blood cells, 3-6 red blood  cells, and few epithelial cells.   HOSPITAL COURSE:  1. Nausea, vomiting, dehydration secondary to diabetic ketoacidosis.      Based on laboratory studies, apparently the patient did have some      degree of a diabetic ketoacidosis.  Patient upon further questions      revealed that she has been running low on her insulin and may have been      subadequately treating her diabetes.  The patient was started on an      insulin drip with aggressive hydration.  Rate of correction was rather      rapid, as within a few hours the patient's anion gap had closed.  She      was eventually moved to NPH injection and the insulin drip was      discontinued one hour after NPH injection.  The patient was restarted      on her Lantus therapy and during the admission had sliding scale      insulin with meal coverage.  By the day of discharge, as the patient      had been running low on her Lantus and NovoLog at home, she was given a      sample of 100 units per milliliter, 10 milliliter vial from the clinic      of Lantus, as well as a NovoLog Flex Pen for 100 units per milliliter      which was a 3 milliliter pen with samples of insulin syringes and      needles.  The patient was given a prescription for both lancets, as      well as needles.  The patient states she had plenty of pen needles as      she previously used a NovoLog Flex Pen and a Lantus Pen.  The patient      is agreeable to following up with her diabetes educator regarding      further modification of her diabetes management.  Of note, her     hemoglobin A1c two weeks ago at her last admission was 12.3%.   1. History of Grave  disease status post radioactive iodine ablation on  hormone replacement.  The patient was admitted with Synthroid 75 mcg      daily.  TSH and free T4 were drawn during this admission which showed      some degree of too much thyroid as her TSH was 0.107, yet her free T4      was 1.1 ng/dL.  As such, the patient was advised we will decrease her      Synthroid to 25 mcg daily and repeat thyroid function tests in six week      time frame.   1. Normocytic anemia with iron deficiency.  Serum ferritin was 23, total      iron 45, TIBC 226, and percent saturation was 20 which did not      thoroughly suggest iron deficiency anemia; however, in a menstruating      female it was felt that the patient would benefit from ferrous sulfate      325 mg p.o. b.i.d. for the interim.  Re-evaluation of her CBC can be      performed as an outpatient.  It is doubtful that the patient has a      severe degree of renal insufficiency given her creatinine at this time.   1. 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  Psychiatry      was consulted after the patient endorsed in the emergency room major      depression.  She did not have any suicidal ideation at present;      however, given the degree of concern for family history of bipolar      disorder with gender identity concerns, psychiatry evaluated the      patient and did feel she would benefit from outpatient psychotherapy.      Recommendation for Wellbutrin 150 mg sustained release was made one      tablet daily for three days, then increase to twice a day dosing at 8      a.m. and 4 p.m.  He recommended only 15 pills dispensed with one refill      to insure adequate followup.  Given the lack of psychiatric help      available in the community, primary care physician could continue      prescribing Wellbutrin with counseling either at [facility] or [facility].  The patient was       amenable to this plan.  She was given samples of Wellbutrin XL 150 mg      one tablet daily.  She was advised to only take one tablet daily until      she follows up with her primary care physician at which point re-      evaluation to include perhaps increasing to b.i.d. dosing will be made.      Of note, currently Wellbutrin sustained release may be available in the      generic form.   PERTINENT LABORATORY STUDIES:  Discharge creatinine 0.6, hemoglobin 10.7,  hematocrit 31.1, white blood cell count 3.7, platelets 144, MCV 85.2.  Serum  iron 45, TIBC 226, iron percent saturation 20, ferritin 23.  TSH 0.107, free  T4 1.111 ng/dL, vitamin B12 450, methylmalonic acid was pending at time of  dictation.  Blood cultures negative x2.      Alyssa Grove, M.D.  Electronically Signed      Thomes Lolling, M.D.  Electronically Signed   FR/MEDQ  D:  07/15/2006  T:  07/16/2006  Job:  [id number]   cc:   Felizardo Hoffmann, M.D.  Thomes Lolling, M.D.  Jacolyn Reedy, M.D.

## 2011-05-06 NOTE — Discharge Summary (Signed)
Angela [name], Angela [name]              ACCOUNT NO.:  [id number]   MEDICAL RECORD NO.:  [id number]          PATIENT TYPE:  INP   LOCATION:  [id number]                         FACILITY:  [facility]   PHYSICIAN:  Annita Brod, M.D.DATE OF BIRTH:  Mar 27, 1986   DATE OF ADMISSION:  11/09/2004  DATE OF DISCHARGE:  11/12/2004                                 DISCHARGE SUMMARY   PRIMARY CARE PHYSICIAN:  The patient will be following up at the [facility].   DISCHARGE DIAGNOSES:  1.  Below-the-knee amputation.  2.  Medical noncompliance.  3.  Diabetes mellitus type 1.  4.  Newly diagnosed hyperthyroidism.  5.  Tobacco abuse.   DISCHARGE MEDICATIONS:  Insulin 75/25 42 units in the morning, 52 units in  the evening.  She has also received prescriptions for syringes and needles.   HOSPITAL COURSE:  The patient is a 25 year old African American female with  past medical history of diabetes mellitus type 1 and noncompliance who  presented on November 09, 2004 with dizziness, fatigue, and she was found to  have elevated blood sugars greater than 500.  Her anion gap was also found  to be elevated, and she was admitted for BKA.  At the time of admission, she  was noted to have a goiter, and thyroid studies were sent off.   In regards to the patient's diabetes and BKA, initially, the patient had  presented with elevated blood sugar greater than 500.  After being started  on IV fluid and the insulin drip, she was transferred to the ICU.  There,  her blood sugars started to come down very well.  In discussion with the  patient, the patient says that she is suppose to be on insulin but rarely  takes it.  She reported no recent illnesses, although, she has been  complaining of a headache as of late.  The patient's blood sugars continue  to come down below 200.  Her anion gap resolved.  She was taken off the IV  glucomander drip as well as fluid and started on p.o. medication plus her  normal insulin  regimen of 75/25 insulin of 42 and 52 units started.  Initially, the patient's blood sugars were elevated in the 300's without an  anion gap after the drip was discontinued, and she was eating.  Once her  insulin coverage began to work, her blood sugars stayed low in the 100's.  The patient, herself, tolerated this well, but she felt quite fatigued.  Hemoglobin A1C with __________ elevated at 10.3 consistent with the  patient's poorly controlled blood sugars and noncompliance.  The patient  tolerated this well.  She received diabetic education.  On the day of  discharge, her blood sugars were felt to be stable.  Her DKA resolved.  She  was given a prescription for her insulin, and she will follow up at [facility].  Given her both hyperthyroidism and diabetes mellitus, her  physician at the [facility] may want to refer her to an  endocrinologist.  In regards to the patient's hypothyroidism, after noting  her goiter, thyroid function labs were sent.  The patient's TSH was found to  be suppressed at 0.009, T4 and T3 tests were ordered.  Total T3 was elevated  at 385 with a range of 60-181.  Her free T4 level was also elevated at 17.4  with a range of 5-12.5.  The patient was noted to have a diffuse goiter.  It  was an issue that she may have Grave's disease, though she had no  ophthalmopathy or other symptoms.  Her neck was not very tender, but it was  suspected that she may have indeed Grave's disease because of her previous  history of diabetes mellitus, that she may have some autoimmune  conditioning.  However, a thyroid and nuclear radio uptake scan was ordered.  She was unable to have the scan done.  The skin was ordered on Thursday,  Thanksgiving, and was not able to be done on Friday since it was a 2 day  second and the second day would be unable to be done on Saturday.  Therefore, decision was made to discharge the patient to home.  I discussed  seriously the  concerns that the patient needed to have follow up and have  the scan done as soon as possible as an outpatient.  Spoke with Radiology,  and appointment has been set for Monday morning, November 28 at 10:30 a.m.  The patient will arrive to have part one of her scan done with the second  part being done on the subsequent day.  Following these results, the patient  may need either radioactive ablation or surgery to correct her  hyperthyroidism, but she may be able to get by on medication only.   DISPOSITION:  Improved.   ACTIVITY:  As tolerated.   DISCHARGE INSTRUCTIONS:  Diet:  An 1800 ADA diet.  Recommended to avoid  caffeine.   PLAN:  She is being discharged home with the understanding that she will  follow up with the [facility] as well as follow up and have thyroid  can done.  She expresses comprehension of the things we talked about as well  as her friend that is present, and the patient tells me she in deed will  have her scan done in follow up.      Send   SKK/MEDQ  D:  11/12/2004  T:  11/12/2004  Job:  [id number]   cc:   [facility]

## 2011-05-06 NOTE — Discharge Summary (Signed)
NAMENICKI, KAUFER NO.:  [id number]   MEDICAL RECORD NO.:  [id number]          PATIENT TYPE:  IPS   LOCATION:  0307                          FACILITY:  [facility]   PHYSICIAN:  Carlton Adam, M.D.      DATE OF BIRTH:  1986/02/11   DATE OF ADMISSION:  08/07/2005  DATE OF DISCHARGE:  08/10/2005                                 DISCHARGE SUMMARY   CHIEF COMPLAINT AND PRESENTING ILLNESS:  This was the first admission to  [facility]  for this 25 year old African-American female,  voluntarily admitted.  History of intention overdose, overdosed on 7 Vicodin  and 2 Naproxen after having an argument with her ex-girlfriend.  Stated that  she just wanted to go to go to sleep.  She was not drinking at that time.  She claimed she was just having too much on her mind.  Problems at school, a  recent foot surgery.  She feeling depressed but denies that was a suicide  attempt.  Reports that her mind races.  Denies any psychotic symptoms.  Has  sleep difficulty and her appetite has been increased.   PAST PSYCHIATRIC HISTORY:  First admission to [facility].  She has  history of overdosing and was hospitalized for apparent overdose in 2004.  In the past she has been on Wellbutrin but she stopped it.   ALCOHOL AND DRUG HISTORY:  Smokes, denies any alcohol or drug use.   PAST MEDICAL HISTORY:  Grave's disease, insulin-dependent diabetes mellitus.   MEDICATIONS:  Levoxyl 75 mcg daily, Humalog mi 75/25 42 units in the  morning, 52 units in the afternoon.   PHYSICAL EXAMINATION:  Performed, failed to show any acute findings.   LABORATORY WORKUP:  CBC:  Hemoglobin 10.9, hematocrit 33.5.  Liver enzymes:  SGOT 11, SGPT 66.  TSH 10.274.   MENTAL STATUS EXAM:  Reveals an alert, cooperative female, fair eye contact,  casually dressed.  Speech was clear, normal pace and tone.  Mood was  depressed, affect was somewhat sad.  Thought processes were coherent.  Cognition  function was intact.  Endorsed no active suicidal or homicidal  ideation.   ADMISSION DIAGNOSES:  AXIS I:  Major depression, recurrent.  AXIS II:  No diagnosis.  AXIS III:  Insulin-dependent diabetes mellitus, Grave's disease, status post  bunionectomy.  AXIS IV:  Moderate.  AXIS V:  Upon admission 25, highest global assessment of function in the  last year 60.   COURSE IN HOSPITAL:  She was admitted and started in individual and group  psychotherapy.  She was maintained on her Levoxyl 25 mcg daily and Humalog  insulin 75/25 42 units in the morning, 52 in the afternoon.  She was given  Ambien for sleep.  She was able to settle down, able to talk about her  issues.  Claimed that she did not intend to harm herself, she just wanted to  go to sleep, following the recent breakup with the girlfriend.  She has  sleep problems and financial problems.  She is unemployed.  She admitted to  poor dietary compliance.  August  35, she was endorsing improvement in her  mood, endorsed that she just wanted to get the attention of her estranged  girlfriend.  She did a lot of work with herself, was trying to get herself  back together, go back to school to finish her GED, find a job.  She was  also wanting to get involved in the church choir.  On August 23, she was in  full contact with reality.  There were no suicidal ideas, no homicidal  ideas, no hallucinations, no delusions.  She felt much better.  She had  dealt with the stress of the relationship.  She had learned ways to cope and  she was willing and motivated to pursue further outpatient treatment.   DISCHARGE DIAGNOSES:  AXIS I:  Depressive disorder not otherwise specified.  AXIS II:  No diagnosis.  AXIS III:  Insulin-dependent diabetes mellitus, Grave's disease.  AXIS IV:  Moderate.  AXIS V:  Upon discharge 55-60.   DISCHARGE MEDICATIONS:  1.  Synthroid 75 mcg daily.  2.  Insulin Humalog 75/25 42 units in the morning and 52 in the  afternoon.  3.  Wellbutrin XL 150 in the morning.  4.  Ambien 10 as needed for sleep.   DISPOSITION:  Follow up [facility], Dr. Heber [place].      Carlton Adam, M.D.  Electronically Signed     IL/MEDQ  D:  09/07/2005  T:  09/08/2005  Job:  [id number]

## 2011-05-09 ENCOUNTER — Emergency Department (HOSPITAL_COMMUNITY)
Admission: EM | Admit: 2011-05-09 | Discharge: 2011-05-09 | Payer: Self-pay | Attending: Emergency Medicine | Admitting: Emergency Medicine

## 2011-05-09 DIAGNOSIS — E1069 Type 1 diabetes mellitus with other specified complication: Secondary | ICD-10-CM | POA: Insufficient documentation

## 2011-05-09 DIAGNOSIS — E039 Hypothyroidism, unspecified: Secondary | ICD-10-CM | POA: Insufficient documentation

## 2011-05-09 DIAGNOSIS — R404 Transient alteration of awareness: Secondary | ICD-10-CM | POA: Insufficient documentation

## 2011-05-09 DIAGNOSIS — F17200 Nicotine dependence, unspecified, uncomplicated: Secondary | ICD-10-CM | POA: Insufficient documentation

## 2011-05-09 DIAGNOSIS — Z794 Long term (current) use of insulin: Secondary | ICD-10-CM | POA: Insufficient documentation

## 2011-05-09 LAB — POCT I-STAT, CHEM 8
Creatinine, Ser: 1 mg/dL (ref 0.4–1.2)
Glucose, Bld: 79 mg/dL (ref 70–99)
Hemoglobin: 12.6 g/dL (ref 12.0–15.0)
TCO2: 24 mmol/L (ref 0–100)

## 2011-06-17 ENCOUNTER — Emergency Department (HOSPITAL_COMMUNITY)
Admission: EM | Admit: 2011-06-17 | Discharge: 2011-06-18 | Disposition: A | Discharge status: Home / Self Care | Payer: Self-pay | Attending: Emergency Medicine | Admitting: Emergency Medicine

## 2011-06-17 ENCOUNTER — Emergency Department (HOSPITAL_COMMUNITY): Payer: Self-pay

## 2011-06-17 DIAGNOSIS — R05 Cough: Secondary | ICD-10-CM | POA: Insufficient documentation

## 2011-06-17 DIAGNOSIS — E039 Hypothyroidism, unspecified: Secondary | ICD-10-CM | POA: Insufficient documentation

## 2011-06-17 DIAGNOSIS — R3589 Other polyuria: Secondary | ICD-10-CM | POA: Insufficient documentation

## 2011-06-17 DIAGNOSIS — F17200 Nicotine dependence, unspecified, uncomplicated: Secondary | ICD-10-CM | POA: Insufficient documentation

## 2011-06-17 DIAGNOSIS — R059 Cough, unspecified: Secondary | ICD-10-CM | POA: Insufficient documentation

## 2011-06-17 DIAGNOSIS — F329 Major depressive disorder, single episode, unspecified: Secondary | ICD-10-CM | POA: Insufficient documentation

## 2011-06-17 DIAGNOSIS — R61 Generalized hyperhidrosis: Secondary | ICD-10-CM | POA: Insufficient documentation

## 2011-06-17 DIAGNOSIS — R079 Chest pain, unspecified: Secondary | ICD-10-CM | POA: Insufficient documentation

## 2011-06-17 DIAGNOSIS — E0500 Thyrotoxicosis with diffuse goiter without thyrotoxic crisis or storm: Secondary | ICD-10-CM | POA: Insufficient documentation

## 2011-06-17 DIAGNOSIS — Z79899 Other long term (current) drug therapy: Secondary | ICD-10-CM | POA: Insufficient documentation

## 2011-06-17 DIAGNOSIS — F3289 Other specified depressive episodes: Secondary | ICD-10-CM | POA: Insufficient documentation

## 2011-06-17 DIAGNOSIS — Z794 Long term (current) use of insulin: Secondary | ICD-10-CM | POA: Insufficient documentation

## 2011-06-17 DIAGNOSIS — J3489 Other specified disorders of nose and nasal sinuses: Secondary | ICD-10-CM | POA: Insufficient documentation

## 2011-06-17 DIAGNOSIS — E119 Type 2 diabetes mellitus without complications: Secondary | ICD-10-CM | POA: Insufficient documentation

## 2011-06-17 DIAGNOSIS — J45909 Unspecified asthma, uncomplicated: Secondary | ICD-10-CM | POA: Insufficient documentation

## 2011-06-17 DIAGNOSIS — R631 Polydipsia: Secondary | ICD-10-CM | POA: Insufficient documentation

## 2011-06-17 DIAGNOSIS — R358 Other polyuria: Secondary | ICD-10-CM | POA: Insufficient documentation

## 2011-06-17 DIAGNOSIS — R070 Pain in throat: Secondary | ICD-10-CM | POA: Insufficient documentation

## 2011-06-17 LAB — BASIC METABOLIC PANEL
CO2: 25 mEq/L (ref 19–32)
GFR calc non Af Amer: 46 mL/min — ABNORMAL LOW (ref >60)
Glucose, Bld: 802 mg/dL (ref 70–99)
Potassium: 5 mEq/L (ref 3.5–5.1)
Sodium: 121 mEq/L — ABNORMAL LOW (ref 135–145)

## 2011-06-17 LAB — URINALYSIS, ROUTINE W REFLEX MICROSCOPIC
Hgb urine dipstick: NEGATIVE
Leukocytes, UA: NEGATIVE
Nitrite: NEGATIVE
Specific Gravity, Urine: 1.026 (ref 1.005–1.030)
Urobilinogen, UA: 0.2 mg/dL (ref 0.0–1.0)

## 2011-06-17 LAB — CBC
Hemoglobin: 13.7 g/dL (ref 12.0–15.0)
MCHC: 34.9 g/dL (ref 30.0–36.0)
RBC: 4.74 MIL/uL (ref 3.87–5.11)

## 2011-06-17 LAB — URINE MICROSCOPIC-ADD ON

## 2011-06-17 LAB — GLUCOSE, CAPILLARY: Glucose-Capillary: >600 mg/dL (ref 70–99)

## 2011-06-17 LAB — POCT PREGNANCY, URINE: Preg Test, Ur: NEGATIVE

## 2011-06-17 LAB — DIFFERENTIAL
Basophils Absolute: 0 10*3/uL (ref 0.0–0.1)
Basophils Relative: 1 % (ref 0–1)
Neutro Abs: 1.8 10*3/uL (ref 1.7–7.7)
Neutrophils Relative %: 48 % (ref 43–77)

## 2011-06-18 LAB — GLUCOSE, CAPILLARY: Glucose-Capillary: 201 mg/dL — ABNORMAL HIGH (ref 70–99)

## 2011-07-10 ENCOUNTER — Inpatient Hospital Stay (HOSPITAL_COMMUNITY)
Admission: EM | Admit: 2011-07-10 | Discharge: 2011-07-11 | DRG: 204 | Disposition: A | Discharge status: Home / Self Care | Payer: Self-pay | Attending: Emergency Medicine | Admitting: Emergency Medicine

## 2011-07-10 DIAGNOSIS — E109 Type 1 diabetes mellitus without complications: Secondary | ICD-10-CM | POA: Diagnosis present

## 2011-07-10 DIAGNOSIS — E039 Hypothyroidism, unspecified: Secondary | ICD-10-CM | POA: Diagnosis present

## 2011-07-10 DIAGNOSIS — F17200 Nicotine dependence, unspecified, uncomplicated: Secondary | ICD-10-CM | POA: Diagnosis present

## 2011-07-10 DIAGNOSIS — F3289 Other specified depressive episodes: Secondary | ICD-10-CM | POA: Diagnosis present

## 2011-07-10 DIAGNOSIS — R05 Cough: Principal | ICD-10-CM | POA: Diagnosis present

## 2011-07-10 DIAGNOSIS — R059 Cough, unspecified: Principal | ICD-10-CM | POA: Diagnosis present

## 2011-07-10 DIAGNOSIS — J45909 Unspecified asthma, uncomplicated: Secondary | ICD-10-CM | POA: Diagnosis present

## 2011-07-10 DIAGNOSIS — F1210 Cannabis abuse, uncomplicated: Secondary | ICD-10-CM | POA: Diagnosis present

## 2011-07-10 DIAGNOSIS — F329 Major depressive disorder, single episode, unspecified: Secondary | ICD-10-CM | POA: Diagnosis present

## 2011-07-11 ENCOUNTER — Emergency Department (HOSPITAL_COMMUNITY): Payer: Self-pay

## 2011-07-11 LAB — URINE MICROSCOPIC-ADD ON

## 2011-07-11 LAB — BASIC METABOLIC PANEL
BUN: 13 mg/dL (ref 6–23)
CO2: 25 mEq/L (ref 19–32)
Calcium: 7.8 mg/dL — ABNORMAL LOW (ref 8.4–10.5)
Chloride: 104 mEq/L (ref 96–112)
Creatinine, Ser: 1.06 mg/dL (ref 0.50–1.10)
GFR calc Af Amer: >60 mL/min (ref >60)
GFR calc non Af Amer: >60 mL/min (ref >60)
Glucose, Bld: 354 mg/dL — ABNORMAL HIGH (ref 70–99)
Potassium: 3.9 mEq/L (ref 3.5–5.1)
Sodium: 138 mEq/L (ref 135–145)

## 2011-07-11 LAB — GLUCOSE, CAPILLARY
Glucose-Capillary: 351 mg/dL — ABNORMAL HIGH (ref 70–99)
Glucose-Capillary: 384 mg/dL — ABNORMAL HIGH (ref 70–99)
Glucose-Capillary: 231 mg/dL — ABNORMAL HIGH (ref 70–99)

## 2011-07-11 LAB — URINALYSIS, ROUTINE W REFLEX MICROSCOPIC
Bilirubin Urine: NEGATIVE
Ketones, ur: NEGATIVE mg/dL
Leukocytes, UA: NEGATIVE
Nitrite: NEGATIVE
Specific Gravity, Urine: 1.029 (ref 1.005–1.030)
Urobilinogen, UA: 0.2 mg/dL (ref 0.0–1.0)
pH: 6.5 (ref 5.0–8.0)

## 2011-07-11 LAB — CBC
MCV: 81.6 fL (ref 78.0–100.0)
Platelets: 124 10*3/uL — ABNORMAL LOW (ref 150–400)
RBC: 3.69 MIL/uL — ABNORMAL LOW (ref 3.87–5.11)
RDW: 11.8 % (ref 11.5–15.5)
WBC: 4.7 10*3/uL (ref 4.0–10.5)

## 2011-07-11 LAB — DIFFERENTIAL
Basophils Absolute: 0 10*3/uL (ref 0.0–0.1)
Basophils Relative: 0 % (ref 0–1)
Eosinophils Absolute: 0.1 10*3/uL (ref 0.0–0.7)
Eosinophils Relative: 1 % (ref 0–5)
Lymphs Abs: 2.2 10*3/uL (ref 0.7–4.0)
Neutrophils Relative %: 47 % (ref 43–77)

## 2011-07-11 LAB — POCT I-STAT, CHEM 8
Calcium, Ion: 1.1 mmol/L — ABNORMAL LOW (ref 1.12–1.32)
Chloride: 99 mEq/L (ref 96–112)
Glucose, Bld: 512 mg/dL — ABNORMAL HIGH (ref 70–99)
Hemoglobin: 11.6 g/dL — ABNORMAL LOW (ref 12.0–15.0)
Potassium: 2.9 mEq/L — ABNORMAL LOW (ref 3.5–5.1)
Sodium: 134 mEq/L — ABNORMAL LOW (ref 135–145)
TCO2: 23 mmol/L (ref 0–100)

## 2011-09-08 ENCOUNTER — Emergency Department (HOSPITAL_COMMUNITY)
Admission: EM | Admit: 2011-09-08 | Discharge: 2011-09-09 | Disposition: A | Discharge status: Home / Self Care | Payer: Self-pay | Attending: Emergency Medicine | Admitting: Emergency Medicine

## 2011-09-08 DIAGNOSIS — B9689 Other specified bacterial agents as the cause of diseases classified elsewhere: Secondary | ICD-10-CM | POA: Insufficient documentation

## 2011-09-08 DIAGNOSIS — J45909 Unspecified asthma, uncomplicated: Secondary | ICD-10-CM | POA: Insufficient documentation

## 2011-09-08 DIAGNOSIS — A499 Bacterial infection, unspecified: Secondary | ICD-10-CM | POA: Insufficient documentation

## 2011-09-08 DIAGNOSIS — E119 Type 2 diabetes mellitus without complications: Secondary | ICD-10-CM | POA: Insufficient documentation

## 2011-09-08 DIAGNOSIS — N760 Acute vaginitis: Secondary | ICD-10-CM | POA: Insufficient documentation

## 2011-09-08 DIAGNOSIS — H9209 Otalgia, unspecified ear: Secondary | ICD-10-CM | POA: Insufficient documentation

## 2011-09-08 DIAGNOSIS — F17200 Nicotine dependence, unspecified, uncomplicated: Secondary | ICD-10-CM | POA: Insufficient documentation

## 2011-09-08 DIAGNOSIS — F3289 Other specified depressive episodes: Secondary | ICD-10-CM | POA: Insufficient documentation

## 2011-09-08 DIAGNOSIS — Z79899 Other long term (current) drug therapy: Secondary | ICD-10-CM | POA: Insufficient documentation

## 2011-09-08 DIAGNOSIS — E0500 Thyrotoxicosis with diffuse goiter without thyrotoxic crisis or storm: Secondary | ICD-10-CM | POA: Insufficient documentation

## 2011-09-08 DIAGNOSIS — F329 Major depressive disorder, single episode, unspecified: Secondary | ICD-10-CM | POA: Insufficient documentation

## 2011-09-08 DIAGNOSIS — Z794 Long term (current) use of insulin: Secondary | ICD-10-CM | POA: Insufficient documentation

## 2011-09-08 DIAGNOSIS — E039 Hypothyroidism, unspecified: Secondary | ICD-10-CM | POA: Insufficient documentation

## 2011-09-08 LAB — CORTISOL
Cortisol, Plasma: 15.8
Cortisol, Plasma: 6.8

## 2011-09-09 LAB — URINALYSIS, ROUTINE W REFLEX MICROSCOPIC
Bilirubin Urine: NEGATIVE
Glucose, UA: >1000 mg/dL — AB
Hgb urine dipstick: NEGATIVE
Ketones, ur: 15 mg/dL — AB
Protein, ur: NEGATIVE mg/dL
pH: 6 (ref 5.0–8.0)

## 2011-09-09 LAB — URINE MICROSCOPIC-ADD ON

## 2011-09-09 LAB — WET PREP, GENITAL: Trich, Wet Prep: NONE SEEN

## 2011-09-12 ENCOUNTER — Emergency Department (HOSPITAL_COMMUNITY): Payer: Self-pay

## 2011-09-12 ENCOUNTER — Emergency Department (HOSPITAL_COMMUNITY)
Admission: EM | Admit: 2011-09-12 | Discharge: 2011-09-12 | Disposition: A | Discharge status: Home / Self Care | Payer: Self-pay | Attending: Emergency Medicine | Admitting: Emergency Medicine

## 2011-09-12 DIAGNOSIS — R358 Other polyuria: Secondary | ICD-10-CM | POA: Insufficient documentation

## 2011-09-12 DIAGNOSIS — Z794 Long term (current) use of insulin: Secondary | ICD-10-CM | POA: Insufficient documentation

## 2011-09-12 DIAGNOSIS — R059 Cough, unspecified: Secondary | ICD-10-CM | POA: Insufficient documentation

## 2011-09-12 DIAGNOSIS — F3289 Other specified depressive episodes: Secondary | ICD-10-CM | POA: Insufficient documentation

## 2011-09-12 DIAGNOSIS — R112 Nausea with vomiting, unspecified: Secondary | ICD-10-CM | POA: Insufficient documentation

## 2011-09-12 DIAGNOSIS — R1013 Epigastric pain: Secondary | ICD-10-CM | POA: Insufficient documentation

## 2011-09-12 DIAGNOSIS — E0500 Thyrotoxicosis with diffuse goiter without thyrotoxic crisis or storm: Secondary | ICD-10-CM | POA: Insufficient documentation

## 2011-09-12 DIAGNOSIS — E109 Type 1 diabetes mellitus without complications: Secondary | ICD-10-CM | POA: Insufficient documentation

## 2011-09-12 DIAGNOSIS — R3589 Other polyuria: Secondary | ICD-10-CM | POA: Insufficient documentation

## 2011-09-12 DIAGNOSIS — E039 Hypothyroidism, unspecified: Secondary | ICD-10-CM | POA: Insufficient documentation

## 2011-09-12 DIAGNOSIS — F329 Major depressive disorder, single episode, unspecified: Secondary | ICD-10-CM | POA: Insufficient documentation

## 2011-09-12 DIAGNOSIS — R05 Cough: Secondary | ICD-10-CM | POA: Insufficient documentation

## 2011-09-12 DIAGNOSIS — Z79899 Other long term (current) drug therapy: Secondary | ICD-10-CM | POA: Insufficient documentation

## 2011-09-12 LAB — URINALYSIS, ROUTINE W REFLEX MICROSCOPIC
Glucose, UA: >1000 mg/dL — AB
Ketones, ur: >80 mg/dL — AB
pH: 5 (ref 5.0–8.0)

## 2011-09-12 LAB — COMPREHENSIVE METABOLIC PANEL
Albumin: 3.5 g/dL (ref 3.5–5.2)
Alkaline Phosphatase: 145 U/L — ABNORMAL HIGH (ref 39–117)
BUN: 9 mg/dL (ref 6–23)
Calcium: 9.3 mg/dL (ref 8.4–10.5)
Creatinine, Ser: 1 mg/dL (ref 0.50–1.10)
GFR calc Af Amer: >60 mL/min (ref >60)
Potassium: 4 mEq/L (ref 3.5–5.1)
Total Protein: 6.7 g/dL (ref 6.0–8.3)

## 2011-09-12 LAB — GLUCOSE, CAPILLARY: Glucose-Capillary: 271 mg/dL — ABNORMAL HIGH (ref 70–99)

## 2011-09-12 LAB — DIFFERENTIAL
Basophils Absolute: 0 10*3/uL (ref 0.0–0.1)
Lymphocytes Relative: 24 % (ref 12–46)
Monocytes Absolute: 0.4 10*3/uL (ref 0.1–1.0)
Neutro Abs: 4.4 10*3/uL (ref 1.7–7.7)

## 2011-09-12 LAB — POCT I-STAT 3, VENOUS BLOOD GAS (G3P V)
pH, Ven: 7.363 — ABNORMAL HIGH (ref 7.250–7.300)
pO2, Ven: 39 mmHg (ref 30.0–45.0)

## 2011-09-12 LAB — URINE MICROSCOPIC-ADD ON

## 2011-09-12 LAB — CBC
MCHC: 32.7 g/dL (ref 30.0–36.0)
MCV: 83.5 fL (ref 78.0–100.0)
Platelets: 201 10*3/uL (ref 150–400)
RDW: 12.9 % (ref 11.5–15.5)
WBC: 6.4 10*3/uL (ref 4.0–10.5)

## 2011-09-12 LAB — LIPASE, BLOOD: Lipase: 15 U/L (ref 11–59)

## 2011-09-14 ENCOUNTER — Other Ambulatory Visit: Payer: Self-pay | Admitting: *Deleted

## 2011-09-14 LAB — CBC
Platelets: 112 — ABNORMAL LOW
RDW: 13.1
WBC: 4.5

## 2011-09-14 MED ORDER — INSULIN GLARGINE 100 UNIT/ML ~~LOC~~ SOLN: 15.0000 [IU] | Freq: Every day | SUBCUTANEOUS | Status: DC

## 2011-09-14 NOTE — Telephone Encounter (Signed)
Pt just out of prison, also out of meds. Last visit 5/11 I scheduled her for visit tomorrow with Dr Obie Dredge

## 2011-09-14 NOTE — Telephone Encounter (Signed)
Thanks will see.

## 2011-09-15 ENCOUNTER — Ambulatory Visit: Payer: Self-pay | Admitting: Internal Medicine

## 2011-09-15 ENCOUNTER — Ambulatory Visit (INDEPENDENT_AMBULATORY_CARE_PROVIDER_SITE_OTHER): Payer: Self-pay | Admitting: Internal Medicine

## 2011-09-15 DIAGNOSIS — E1149 Type 2 diabetes mellitus with other diabetic neurological complication: Secondary | ICD-10-CM

## 2011-09-15 DIAGNOSIS — E109 Type 1 diabetes mellitus without complications: Secondary | ICD-10-CM

## 2011-09-15 DIAGNOSIS — E890 Postprocedural hypothyroidism: Secondary | ICD-10-CM

## 2011-09-15 LAB — GLUCOSE, CAPILLARY: Glucose-Capillary: 174 mg/dL — ABNORMAL HIGH (ref 70–99)

## 2011-09-15 LAB — TSH: TSH: 136.408 u[IU]/mL — ABNORMAL HIGH (ref 0.350–4.500)

## 2011-09-15 LAB — POCT GLYCOSYLATED HEMOGLOBIN (HGB A1C): Hemoglobin A1C: 13.4

## 2011-09-15 MED ORDER — INSULIN GLARGINE 100 UNIT/ML ~~LOC~~ SOLN: 15.0000 [IU] | Freq: Every day | SUBCUTANEOUS | Status: DC

## 2011-09-15 MED ORDER — INSULIN ASPART PROT & ASPART (70-30 MIX) 100 UNIT/ML ~~LOC~~ SUSP: SUBCUTANEOUS | Status: DC

## 2011-09-15 MED ORDER — AMITRIPTYLINE HCL 50 MG PO TABS: 50.0000 mg | ORAL_TABLET | Freq: Every day | ORAL | Status: DC

## 2011-09-15 MED ORDER — LEVOTHYROXINE SODIUM 75 MCG PO TABS: 75.0000 ug | ORAL_TABLET | Freq: Every day | ORAL | Status: DC

## 2011-09-15 MED ORDER — INSULIN NPH (HUMAN) (ISOPHANE) 100 UNIT/ML ~~LOC~~ SUSP: 10.0000 [IU] | Freq: Two times a day (BID) | SUBCUTANEOUS | Status: DC

## 2011-09-15 MED ORDER — PROMETHAZINE HCL 12.5 MG PO TABS: 12.5000 mg | ORAL_TABLET | Freq: Three times a day (TID) | ORAL | Status: DC | PRN

## 2011-09-15 NOTE — Assessment & Plan Note (Addendum)
Lab Results  Component Value Date   HGBA1C 8.2 04/21/2010   CREATININE 1.00 09/12/2011   MICROALBUR 1.05 10/14/2009   MICRALBCREAT 8.7 10/14/2009   CHOL 220* 10/01/2008   HDL 69 10/01/2008   TRIG 88 10/01/2008    Last eye exam and foot exam:    Component Value Date/Time   HMDIABFOOTEX done 10/14/2009    Assessment: Diabetes control: not controlled Progress toward goals: unable to assess Barriers to meeting goals: lack of insurance, financial need and nonadherence to medications  Plan: Diabetes treatment: Start NovoLog 70/30 at 15 units before breakfast and 10 units before dinner. I will give her one sample of Novolog70/30 pen today. Refer to: diabetes educator for self-management training and diabetes educator for medical nutrition therapy Instruction/counseling given: reminded to bring blood glucose meter & log to each visit, reminded to bring medications to each visit and discussed diet  HbA1c 13.4 today She'll be coming back in 7-10 days and insulin needs to be adjusted at that time. Please coordinate care with Barnabas Harries during that visit.

## 2011-09-15 NOTE — Progress Notes (Signed)
  Subjective:    Patient ID: Angela [name], female    DOB: 1986-02-04, 25 y.o.   MRN: [id number]  HPI Angela [name] is a pleasant 25 year old woman with past with history of type I DM, hypothyroidism who comes the clinic after a long time for ER followup. She was seen in our clinic last time in may 2011. She was in jail for March 2012 to June 2012. After coming out of jail she hasn't taken any medications-no Synthroid. She did take about 40-50 units of NovoLog daily. She was seen in ER on September 21 and 09/12/2011 for nausea and vomiting and vaginitis. She was diagnosed with bacterial vaginosis and was given metronidazole-which she will complete the course tomorrow. She still having some nausea. Did not complain of any blood in the vomit. Nausea and vomiting is going on for 3 weeks now. She does complain of mild bilateral pedal edema with pain for 2-3 weeks. Denies any fever, chills, cough, chest pain, short of breath, abdominal pain. Barnabas Harries talked with her today and I decided to start her on Novolin 70/30 15 units before breakfast and 10 units before dinner.   Review of Systems     as per history of present illness, all other systems reviewed and negative.     Objective:   Physical Exam   Constitutional: Vital signs reviewed.  Patient is a well-developed and well-nourished in no acute distress and cooperative with exam. Alert and oriented x3.  Head: Normocephalic and atraumatic Ear: TM normal bilaterally Mouth: no erythema or exudates, MMM Eyes: PERRL, EOMI, conjunctivae normal, No scleral icterus.  Neck: Supple, Trachea midline normal ROM, No JVD Cardiovascular: RRR, S1 normal, S2 normal, no MRG Pulmonary/Chest: CTAB, no wheezes, rales, or rhonchi Abdominal: Soft. Non-tender, non-distended, bowel sounds are normal Musculoskeletal: Trace pedal edema bilaterally, mild tenderness to touch on bilateral legs anteriorly, no redness, open wounds. No joint deformities, erythema, or  stiffness, ROM full and no nontender Neurological: A&O x3, Strenght is normal and symmetric bilaterally, cranial nerve II-XII are grossly intact, no focal motor deficit, sensory intact to light touch bilaterally.  Skin: Warm, dry and intact. No rash, cyanosis, or clubbing.  multiple tattoos noted.       Assessment & Plan:

## 2011-09-15 NOTE — Patient Instructions (Addendum)
Please make an appointment in 7-10 days. Please make an appointment with Barnabas Harries on the same day if possible. Please do the paperwork for getting orange card. Please start using NovoLog 70/30- 15 units before breakfast and 10 units before dinner. I will send a prescription of NovoLog 70/30 to [facility] Please get his prescription filled for Synthroid 75 mcg daily and start taking it from today. Also get the refill for amitriptyline and Phenergan. Phenergan will help for nausea and vomiting.

## 2011-09-15 NOTE — Assessment & Plan Note (Signed)
I will refill amitriptyline today. She's going to pick up from [facility].

## 2011-09-15 NOTE — Assessment & Plan Note (Signed)
I will check TSH today. She will get the prescription of 75 mcg Synthroid daily from [facility]. Please adjust the dose of Synthroid as needed during next visit. We might need to start her on high-dose and 75 for 2-3 months and then recheck TSH and then lower the dose based on response of TSH.

## 2011-09-19 LAB — GLUCOSE, CAPILLARY: Glucose-Capillary: 363 — ABNORMAL HIGH

## 2011-09-23 LAB — BASIC METABOLIC PANEL
BUN: 3 — ABNORMAL LOW
BUN: <1 — ABNORMAL LOW
CO2: 21
CO2: 23
CO2: 23
Calcium: 7.3 — ABNORMAL LOW
Calcium: 7.6 — ABNORMAL LOW
Chloride: 102
Chloride: 111
Creatinine, Ser: 0.6
Creatinine, Ser: 0.67
Creatinine, Ser: 0.75
GFR calc Af Amer: >60
GFR calc Af Amer: >60
GFR calc Af Amer: >60
GFR calc non Af Amer: >60
GFR calc non Af Amer: >60
Glucose, Bld: 124 — ABNORMAL HIGH
Glucose, Bld: 125 — ABNORMAL HIGH
Glucose, Bld: 83
Potassium: 3.7
Sodium: 137
Sodium: 138

## 2011-09-23 LAB — LIPASE, BLOOD: Lipase: 20

## 2011-09-23 LAB — URINALYSIS, ROUTINE W REFLEX MICROSCOPIC
Bilirubin Urine: NEGATIVE
Hgb urine dipstick: NEGATIVE
Ketones, ur: NEGATIVE
Nitrite: NEGATIVE
Urobilinogen, UA: 0.2

## 2011-09-23 LAB — MAGNESIUM: Magnesium: 1.6

## 2011-09-23 LAB — DIFFERENTIAL
Basophils Absolute: 0
Lymphocytes Relative: 25
Lymphs Abs: 1.4
Neutro Abs: 3.9

## 2011-09-23 LAB — COMPREHENSIVE METABOLIC PANEL
AST: 19
BUN: 12
BUN: 15
CO2: 23
CO2: 25
Calcium: 8.7
Chloride: 109
Chloride: 90 — ABNORMAL LOW
Creatinine, Ser: 0.89
Creatinine, Ser: 1.13
GFR calc Af Amer: >60
GFR calc non Af Amer: >60
GFR calc non Af Amer: >60
Glucose, Bld: 878
Total Bilirubin: 0.9
Total Bilirubin: 1.1

## 2011-09-23 LAB — CULTURE, BLOOD (ROUTINE X 2): Culture: NO GROWTH

## 2011-09-23 LAB — CBC
HCT: 27 — ABNORMAL LOW
HCT: 28.6 — ABNORMAL LOW
HCT: 33.6 — ABNORMAL LOW
Hemoglobin: 10.8 — ABNORMAL LOW
Hemoglobin: 8.9 — ABNORMAL LOW
Hemoglobin: 9.3 — ABNORMAL LOW
MCHC: 32.6
MCV: 85.5
MCV: 85.7
Platelets: 140 — ABNORMAL LOW
Platelets: 148 — ABNORMAL LOW
Platelets: 172
RBC: 3.16 — ABNORMAL LOW
RDW: 12.4
RDW: 12.7
WBC: 4.9
WBC: 5.7

## 2011-09-23 LAB — LIPID PANEL: Triglycerides: 62

## 2011-09-23 LAB — RAPID URINE DRUG SCREEN, HOSP PERFORMED
Amphetamines: NOT DETECTED
Barbiturates: NOT DETECTED
Cocaine: NOT DETECTED
Opiates: NOT DETECTED
Tetrahydrocannabinol: NOT DETECTED

## 2011-09-23 LAB — I-STAT 8, (EC8 V) (CONVERTED LAB)
Acid-Base Excess: 2
Bicarbonate: 26.8 — ABNORMAL HIGH
HCT: 38
Hemoglobin: 12.9
Operator id: 133351
Sodium: 122 — ABNORMAL LOW
TCO2: 28
pCO2, Ven: 40.2 — ABNORMAL LOW

## 2011-09-23 LAB — URINE CULTURE: Colony Count: 7000

## 2011-09-23 LAB — URINE MICROSCOPIC-ADD ON

## 2011-09-23 LAB — ETHANOL: Alcohol, Ethyl (B): <5

## 2011-09-23 LAB — POCT I-STAT CREATININE: Creatinine, Ser: 1.2

## 2011-09-23 LAB — SALICYLATE LEVEL: Salicylate Lvl: <4

## 2011-09-23 LAB — ACETAMINOPHEN LEVEL: Acetaminophen (Tylenol), Serum: <10 — ABNORMAL LOW

## 2011-09-23 LAB — HEMOGLOBIN A1C: Mean Plasma Glucose: 386

## 2011-09-23 LAB — OCCULT BLOOD (STOOL CUP TO LAB): Fecal Occult Bld: NEGATIVE

## 2011-09-26 ENCOUNTER — Ambulatory Visit (INDEPENDENT_AMBULATORY_CARE_PROVIDER_SITE_OTHER): Payer: Self-pay | Admitting: Internal Medicine

## 2011-09-26 ENCOUNTER — Encounter: Payer: Self-pay | Admitting: Internal Medicine

## 2011-09-26 ENCOUNTER — Encounter: Payer: Self-pay | Admitting: Dietician

## 2011-09-26 ENCOUNTER — Other Ambulatory Visit: Payer: Self-pay | Admitting: Internal Medicine

## 2011-09-26 VITALS — BP 125/80 | HR 86 | Temp 97.2°F | Ht 64.0 in | Wt 162.2 lb

## 2011-09-26 DIAGNOSIS — E785 Hyperlipidemia, unspecified: Secondary | ICD-10-CM

## 2011-09-26 DIAGNOSIS — E890 Postprocedural hypothyroidism: Secondary | ICD-10-CM

## 2011-09-26 DIAGNOSIS — R05 Cough: Secondary | ICD-10-CM

## 2011-09-26 DIAGNOSIS — Z23 Encounter for immunization: Secondary | ICD-10-CM

## 2011-09-26 DIAGNOSIS — N898 Other specified noninflammatory disorders of vagina: Secondary | ICD-10-CM

## 2011-09-26 DIAGNOSIS — E109 Type 1 diabetes mellitus without complications: Secondary | ICD-10-CM

## 2011-09-26 DIAGNOSIS — K219 Gastro-esophageal reflux disease without esophagitis: Secondary | ICD-10-CM | POA: Insufficient documentation

## 2011-09-26 DIAGNOSIS — E1149 Type 2 diabetes mellitus with other diabetic neurological complication: Secondary | ICD-10-CM

## 2011-09-26 LAB — BASIC METABOLIC PANEL
BUN: 13 mg/dL (ref 6–23)
Calcium: 10.1 mg/dL (ref 8.4–10.5)
Creat: 1.1 mg/dL (ref 0.50–1.10)

## 2011-09-26 LAB — LIPID PANEL
HDL: 75 mg/dL (ref >39)
LDL Cholesterol: 181 mg/dL — ABNORMAL HIGH (ref 0–99)
Triglycerides: 129 mg/dL (ref <150)

## 2011-09-26 LAB — GLUCOSE, CAPILLARY: Glucose-Capillary: >600 mg/dL (ref 70–99)

## 2011-09-26 MED ORDER — AMITRIPTYLINE HCL 50 MG PO TABS: 50.0000 mg | ORAL_TABLET | Freq: Every day | ORAL | Status: DC

## 2011-09-26 MED ORDER — PANTOPRAZOLE SODIUM 40 MG PO TBEC: 40.0000 mg | DELAYED_RELEASE_TABLET | Freq: Every day | ORAL | Status: DC

## 2011-09-26 MED ORDER — FLUCONAZOLE 150 MG PO TABS: 150.0000 mg | ORAL_TABLET | Freq: Once | ORAL | Status: DC

## 2011-09-26 MED ORDER — ALBUTEROL SULFATE HFA 108 (90 BASE) MCG/ACT IN AERS: INHALATION_SPRAY | RESPIRATORY_TRACT | Status: DC

## 2011-09-26 MED ORDER — MOMETASONE FUROATE 50 MCG/ACT NA SUSP: 2.0000 | Freq: Every day | NASAL | Status: DC

## 2011-09-26 MED ORDER — INSULIN ASPART 100 UNIT/ML ~~LOC~~ SOLN
10.0000 [IU] | Freq: Once | SUBCUTANEOUS | Status: AC
  Administered 2011-09-26: 10 [IU] via SUBCUTANEOUS

## 2011-09-26 MED ORDER — INSULIN ASPART PROT & ASPART (70-30 MIX) 100 UNIT/ML ~~LOC~~ SUSP: SUBCUTANEOUS | Status: DC

## 2011-09-26 MED ORDER — LEVOTHYROXINE SODIUM 75 MCG PO TABS: 75.0000 ug | ORAL_TABLET | Freq: Every day | ORAL | Status: DC

## 2011-09-26 NOTE — Assessment & Plan Note (Signed)
Patient noted that she could not fill her prescription . I provided her printed prescription and [facility]

## 2011-09-26 NOTE — Assessment & Plan Note (Signed)
Today's blood glucose was 674. Patient was given Novolog 10 units int he clinic and home dose insulin was increased to 22 units in the morning and 15 units in the evening ( 0.5 units/kg = 37 units total) . I underlined the importance to check her CBG every morning before breakfast and bring her meter at the next office visit. If she notices significant increase in her CBG she needs to call the clinic for further instruction.

## 2011-09-26 NOTE — Assessment & Plan Note (Signed)
Most likely a combination of post nasal drip and GERD. Patient was incarcerated from 05-05/2011. Consider this patient is at risk for any kind of infectious process like TB but unlikely since patient's CXRay in 09/12/2011 showed no active disease. I will restart Protonix and nasonex and reevaluate . If she continued to have cough patient may need to further work up.

## 2011-09-26 NOTE — Patient Instructions (Addendum)
1. Please take Novolog 70/30 25 units in the morning and 15 units in the evening.  2. Please schedule an appointment with Butch Penny ( Diabetic Educator)  3. Please check your blood glucose level every morning before breakfast and  Bring in a meter at the next office visit. 4. Take all your medication as prescribed. If you trouble to filling your medication please call the clinic.

## 2011-09-26 NOTE — Assessment & Plan Note (Signed)
Patient was restarted on Synthroid a the last office visit. TSH was 136.4 . She noted that she has been taking the medication. I provided her  A refill today. Need TSH in 4 weeks.

## 2011-09-26 NOTE — Progress Notes (Deleted)
  Subjective:    Patient ID: Angela [name], female    DOB: May 08, 1986, 25 y.o.   MRN: [id number]  HPI    Review of Systems     Objective:   Physical Exam        Assessment & Plan:

## 2011-09-26 NOTE — Assessment & Plan Note (Signed)
Patient was noted at the last Wet prep to have few Candida therefore I will prescribe her Diflucan at this point. If she continues to have vaginal discharge consider to reevaluate her.

## 2011-09-26 NOTE — Progress Notes (Signed)
Subjective:   Patient ID: Angela [name] female   DOB: 1986/06/06 25 y.o.   MRN: [id number]  HPI: [name] is a 25 y.o. female with PMH significant for DM type 1, Hypothyroidism who presented to the clinic for a regular office visit after being restarted on Synthroid and Novolog 70/30.  1. DM: patient noted that she has been taking her Insulin. She has not checked her CBGs. She noted that she was feeling nauseated this morning but after getting up she took her morning dose insulin and her symptoms improved. She still feels kind of tired.  2. Cough: patient noted that this has been going on for more then 6 month. It is more in the night. She noted it a dry cough, not associated with fevers or chills. She has sometime the feeling that something is dripping down the throat. She used to take PPI which was working well for her but she has no prescription at this point.  3. Leg swelling: patient noted that she had leg edema the last time she was seen in the office. It got progressively worse and she took a fluid pill from her mum and the swelling went down 4. Vaginal dischareg: She was diagnosed with Bacterial vaginosis and was given Flagyl. Patient noted she continues to have white vaginal discharge and itching.    Current Outpatient Prescriptions  Medication Sig Dispense Refill  . albuterol (PROVENTIL HFA;VENTOLIN HFA) 108 (90 BASE) MCG/ACT inhaler Inhale 1 to 2 puffs once every 4 to 6 hours as needed for shortness of breath  3 Inhaler  6  . amitriptyline (ELAVIL) 50 MG tablet Take 1 tablet (50 mg total) by mouth at bedtime.  30 tablet  5  . ferrous sulfate 325 (65 FE) MG EC tablet Take 325 mg by mouth 2 (two) times daily.        . fexofenadine (ALLEGRA) 180 MG tablet Take 180 mg by mouth daily.        . insulin aspart protamine-insulin aspart (NOVOLOG 70/30) (70-30) 100 UNIT/ML injection Please use 15 units before breakfast and 10 units before dinner everyday.  10 mL  12  . levothyroxine  (SYNTHROID, LEVOTHROID) 75 MCG tablet Take 1 tablet (75 mcg total) by mouth daily.  31 tablet  6  . mometasone (NASONEX) 50 MCG/ACT nasal spray 2 sprays by Nasal route daily.        . pantoprazole (PROTONIX) 40 MG tablet Take 40 mg by mouth daily.        . promethazine (PHENERGAN) 12.5 MG tablet Take 1 tablet (12.5 mg total) by mouth every 8 (eight) hours as needed. For nausea  30 tablet  3   No family history on file. History   Social History  . Marital Status: Divorced    Spouse Name: N/A    Number of Children: N/A  . Years of Education: N/A   Social History Main Topics  . Smoking status: Not on file  . Smokeless tobacco: Not on file  . Alcohol Use: Not on file  . Drug Use: Not on file  . Sexually Active: Not on file   Other Topics Concern  . Not on file   Social History Narrative  . No narrative on file   Review of Systems: Constitutional: Denies fever, chills, diaphoresis, appetite change but noted fatigue.  HEENT: Denies photophobia, eye pain, redness, hearing loss, ear pain, congestion, sore throat,  trouble swallowing, neck pain, neck stiffness and tinnitus but noted rhinorrhea, sneezing, mouth sores.  Respiratory: Denies SOB, DOE, chest tightness,  and wheezing but cough, .   Cardiovascular: Denies chest pain, palpitations and leg swelling.  Gastrointestinal: Denies vomiting, abdominal pain, diarrhea, constipation, blood in stool and abdominal distention but noted  Nausea.  Genitourinary: Denies dysuria, urgency, frequency, hematuria, flank pain and difficulty urinating. But noted that she continues to have vaginal discharge. .  Skin: Denies pallor, rash and wound.  Neurological: Denies dizziness, seizures, syncope, weakness, light-headedness, numbness and headaches.    Objective:  Physical Exam: Constitutional: Vital signs reviewed.  Patient is a well-developed and well-nourished  in no acute distress and cooperative with exam. Alert and oriented x3.  Neck: Supple,    Cardiovascular: RRR, S1 normal, S2 normal, no MRG, pulses symmetric and intact bilaterally Pulmonary/Chest: CTAB, no wheezes, rales, or rhonchi Abdominal: Soft. Non-tender, non-distended, bowel sounds are normal, no masses, organomegaly, or guarding present.  GU: no CVA tenderness Musculoskeletal: No joint deformities, erythema, or stiffness, ROM full and no nontender Neurological: A&O x3, no focal motor deficit, sensory intact to light touch bilaterally.  Skin: Warm, dry and intact. No rash, cyanosis, or clubbing.

## 2011-09-27 DIAGNOSIS — E785 Hyperlipidemia, unspecified: Secondary | ICD-10-CM | POA: Insufficient documentation

## 2011-09-27 MED ORDER — PRAVASTATIN SODIUM 20 MG PO TABS: 20.0000 mg | ORAL_TABLET | Freq: Every evening | ORAL | Status: DC

## 2011-09-27 NOTE — Progress Notes (Signed)
Addended by: Rosalia Hammers on: 09/27/2011 02:03 PM   Modules accepted: Orders

## 2011-09-27 NOTE — Assessment & Plan Note (Addendum)
Will stat Pravastatin 20 mg daily. Recheck LFT in 4-6 weeks.  Lab Results  Component Value Date   CHOL 282* 09/26/2011   CHOL 220* 10/01/2008   CHOL  Value: 154        ATP III CLASSIFICATION:  <200     mg/dL   Desirable  200-239  mg/dL   Borderline High  >=240    mg/dL   High 12/10/2007   Lab Results  Component Value Date   HDL 75 09/26/2011   HDL 69 10/01/2008   HDL 48 12/10/2007   Lab Results  Component Value Date   LDLCALC 181* 09/26/2011   LDLCALC 133* 10/01/2008   LDLCALC  Value: 94        Total Cholesterol/HDL:CHD Risk Coronary Heart Disease Risk Table                     Men   Women  1/2 Average Risk   3.4   3.3 12/10/2007   Lab Results  Component Value Date   TRIG 129 09/26/2011   TRIG 88 10/01/2008   TRIG 62 12/10/2007   Lab Results  Component Value Date   CHOLHDL 3.8 09/26/2011   CHOLHDL 3.2 Ratio 10/01/2008   CHOLHDL 3.2 12/10/2007   No results found for this basename: LDLDIRECT

## 2011-09-30 ENCOUNTER — Ambulatory Visit: Payer: Self-pay | Admitting: Dietician

## 2011-10-04 ENCOUNTER — Ambulatory Visit: Payer: Self-pay | Admitting: Internal Medicine

## 2011-10-20 ENCOUNTER — Other Ambulatory Visit: Payer: Self-pay | Admitting: *Deleted

## 2011-10-20 DIAGNOSIS — N898 Other specified noninflammatory disorders of vagina: Secondary | ICD-10-CM

## 2011-10-20 MED ORDER — FLUCONAZOLE 150 MG PO TABS: 150.0000 mg | ORAL_TABLET | Freq: Once | ORAL | Status: AC

## 2011-10-20 NOTE — Telephone Encounter (Signed)
If recurs, pt will need eval bc already tx for BV and this will be second tx for yeast.

## 2011-10-20 NOTE — Telephone Encounter (Signed)
Pt c/o yeast infection 

## 2011-10-20 NOTE — Telephone Encounter (Signed)
She had this a few weeks ago.  Did it work for a time and then relapse?

## 2011-10-20 NOTE — Telephone Encounter (Signed)
Rx called into [facility] on [address] Pt informed

## 2011-10-20 NOTE — Telephone Encounter (Signed)
Pt called and states Diflucan worked well on 10/8 but has returned again. Please refill

## 2011-12-16 ENCOUNTER — Telehealth: Payer: Self-pay | Admitting: *Deleted

## 2011-12-16 MED ORDER — FLUCONAZOLE 150 MG PO TABS: 150.0000 mg | ORAL_TABLET | Freq: Once | ORAL | Status: AC

## 2011-12-16 NOTE — Telephone Encounter (Signed)
Message from pt requesting Diflucan for a yeast infection.  Has had a frequent yeast infections. Uses the [facility] on [address].   Sander Nephew, RN 12/16/2011 11:35 AM

## 2011-12-16 NOTE — Telephone Encounter (Signed)
Script entered.  

## 2012-01-24 ENCOUNTER — Encounter: Payer: Self-pay | Admitting: Dietician

## 2012-02-03 ENCOUNTER — Encounter: Payer: Self-pay | Admitting: Internal Medicine

## 2012-02-03 ENCOUNTER — Ambulatory Visit (INDEPENDENT_AMBULATORY_CARE_PROVIDER_SITE_OTHER): Payer: Self-pay | Admitting: Internal Medicine

## 2012-02-03 VITALS — BP 118/77 | Temp 97.9°F | Ht 64.0 in

## 2012-02-03 DIAGNOSIS — E109 Type 1 diabetes mellitus without complications: Secondary | ICD-10-CM

## 2012-02-03 DIAGNOSIS — E785 Hyperlipidemia, unspecified: Secondary | ICD-10-CM

## 2012-02-03 DIAGNOSIS — F329 Major depressive disorder, single episode, unspecified: Secondary | ICD-10-CM

## 2012-02-03 DIAGNOSIS — Z79899 Other long term (current) drug therapy: Secondary | ICD-10-CM

## 2012-02-03 DIAGNOSIS — E890 Postprocedural hypothyroidism: Secondary | ICD-10-CM

## 2012-02-03 LAB — GLUCOSE, CAPILLARY

## 2012-02-03 LAB — POCT GLYCOSYLATED HEMOGLOBIN (HGB A1C): Hemoglobin A1C: >14

## 2012-02-03 MED ORDER — PRAVASTATIN SODIUM 20 MG PO TABS: 20.0000 mg | ORAL_TABLET | Freq: Every evening | ORAL | Status: DC

## 2012-02-03 MED ORDER — CITALOPRAM HYDROBROMIDE 20 MG PO TABS: 20.0000 mg | ORAL_TABLET | Freq: Every day | ORAL | Status: DC

## 2012-02-03 NOTE — Assessment & Plan Note (Signed)
Historically uncontrolled. On and off antidepressants. Lost to follow up with psychiatrist. Been on multiple medications before. No suicidal or homicidal ideations. The centricity chart for detailed records. Start Celexa which is a $4 medicine. If difficulty pharmacy does not have it can change to Prozac or Paxil. Can add trazodone which may help with insomnia Advised followup with [facility] but doubt compliance She will need frequent clinic visit for better monitoring

## 2012-02-03 NOTE — Assessment & Plan Note (Signed)
Goal LDL less than 100. Patient states she wants to start taking her Pravachol again. will gave her a prescription

## 2012-02-03 NOTE — Assessment & Plan Note (Addendum)
Never controlled. Never checks sugars at home. History of DKA. Insulin noncompliance. She takes insulin off and on. Drinks a lot of coke everyday. Sleeps during the day and stays awake all night so forgets to take her insulin and other medicines  Depression and hypothyroidism are main concern at this time which we will try to work on. No change in insulin regimen today

## 2012-02-03 NOTE — Assessment & Plan Note (Signed)
Patient is complaining of fatigue all day. She is also having trouble sleeping at night. She sleeps during the day. She does not have any interest in her hobbies. She does not feel like taking her medications. She feels thather legs are swollen and she feels bloated. Not compliant with her Synthroid. Her symptoms are suggestive of hypothyroidism and depression. Will not check TSH today because she hasn't taken any medication After counseling, she states she will take her Synthroid. She also had financial problems before she says have been taken care of. I will ask her to focus on taking her Synthroid at this time because maybe her hypothyroidism is contributing significantly to cause further problems. Follow up in one month.

## 2012-02-03 NOTE — Patient Instructions (Signed)
Follow up in one month Take celexa, synthroid and insulin regularly Try to follow up with [facility] clinic (Walk in)

## 2012-02-03 NOTE — Progress Notes (Signed)
Patient ID: Angela [name], female   DOB: 1986-11-21, 26 y.o.   MRN: [id number]  26 y/o w with pmh listed below comes for follow up. Feels weak and tired and unable to sleep. See a/p for details Not compliant with medications   Physical exam   General Appearance:     Filed Vitals:   02/03/12 1544  BP: 118/77  Temp: 97.9 F (36.6 C)  Height: 5\' 4"  (1.626 m)  SpO2: 97%     Alert, cooperative, no distress, appears stated age  Head:    Normocephalic, without obvious abnormality, atraumatic  Eyes:    PERRL, conjunctiva/corneas clear, EOM's intact, fundi    benign, both eyes       Neck:   Supple, symmetrical, trachea midline, no adenopathy;       thyroid:  No enlargement/tenderness/nodules; no carotid   bruit or JVD  Lungs:     Clear to auscultation bilaterally, respirations unlabored  Chest wall:    No tenderness or deformity  Heart:    Regular rate and rhythm, S1 and S2 normal, no murmur, rub   or gallop  Abdomen:     Soft, non-tender, bowel sounds active all four quadrants,    no masses, no organomegaly  Extremities:   Extremities normal, atraumatic, no cyanosis or edema  Pulses:   2+ and symmetric all extremities  Skin:   Multiple tattoos, Skin color, texture, turgor normal, no rashes or lesions  Neurologic:  nonfocal grossly   ROS  Constitutional: c/o fatigue, insomnia,  Denies fever, chills, diaphoresis,  Respiratory: Denies SOB, DOE, cough, chest tightness,  and wheezing.   Cardiovascular: Denies chest pain, palpitations and leg swelling.  Gastrointestinal: Denies nausea, vomiting, abdominal pain, diarrhea, constipation, blood in stool and abdominal distention.  Skin: Denies pallor, rash and wound.  Neurological: Denies dizziness, light-headedness, numbness and headaches.

## 2012-03-02 ENCOUNTER — Ambulatory Visit (HOSPITAL_COMMUNITY)
Admission: RE | Admit: 2012-03-02 | Discharge: 2012-03-02 | Disposition: A | Discharge status: Home / Self Care | Payer: Self-pay | Source: Ambulatory Visit | Attending: Internal Medicine | Admitting: Internal Medicine

## 2012-03-02 ENCOUNTER — Encounter: Payer: Self-pay | Admitting: Internal Medicine

## 2012-03-02 ENCOUNTER — Encounter (HOSPITAL_COMMUNITY): Payer: Self-pay | Admitting: Internal Medicine

## 2012-03-02 ENCOUNTER — Observation Stay (HOSPITAL_COMMUNITY)
Admission: AD | Admit: 2012-03-02 | Discharge: 2012-03-03 | Disposition: A | Discharge status: Home / Self Care | Payer: Self-pay | Source: Ambulatory Visit | Attending: Internal Medicine | Admitting: Internal Medicine

## 2012-03-02 ENCOUNTER — Ambulatory Visit (INDEPENDENT_AMBULATORY_CARE_PROVIDER_SITE_OTHER): Payer: Self-pay | Admitting: Internal Medicine

## 2012-03-02 VITALS — BP 104/70 | HR 65 | Temp 97.0°F | Ht 64.0 in | Wt 164.5 lb

## 2012-03-02 DIAGNOSIS — E890 Postprocedural hypothyroidism: Secondary | ICD-10-CM

## 2012-03-02 DIAGNOSIS — F3289 Other specified depressive episodes: Secondary | ICD-10-CM

## 2012-03-02 DIAGNOSIS — E1029 Type 1 diabetes mellitus with other diabetic kidney complication: Secondary | ICD-10-CM | POA: Diagnosis present

## 2012-03-02 DIAGNOSIS — R739 Hyperglycemia, unspecified: Secondary | ICD-10-CM

## 2012-03-02 DIAGNOSIS — K219 Gastro-esophageal reflux disease without esophagitis: Secondary | ICD-10-CM

## 2012-03-02 DIAGNOSIS — F17200 Nicotine dependence, unspecified, uncomplicated: Secondary | ICD-10-CM | POA: Insufficient documentation

## 2012-03-02 DIAGNOSIS — F329 Major depressive disorder, single episode, unspecified: Secondary | ICD-10-CM | POA: Diagnosis present

## 2012-03-02 DIAGNOSIS — E1010 Type 1 diabetes mellitus with ketoacidosis without coma: Principal | ICD-10-CM | POA: Diagnosis present

## 2012-03-02 DIAGNOSIS — E109 Type 1 diabetes mellitus without complications: Secondary | ICD-10-CM

## 2012-03-02 DIAGNOSIS — D649 Anemia, unspecified: Secondary | ICD-10-CM

## 2012-03-02 DIAGNOSIS — K029 Dental caries, unspecified: Secondary | ICD-10-CM | POA: Insufficient documentation

## 2012-03-02 DIAGNOSIS — Z794 Long term (current) use of insulin: Secondary | ICD-10-CM | POA: Insufficient documentation

## 2012-03-02 DIAGNOSIS — Z79899 Other long term (current) drug therapy: Secondary | ICD-10-CM | POA: Insufficient documentation

## 2012-03-02 DIAGNOSIS — E039 Hypothyroidism, unspecified: Secondary | ICD-10-CM | POA: Insufficient documentation

## 2012-03-02 DIAGNOSIS — E785 Hyperlipidemia, unspecified: Secondary | ICD-10-CM

## 2012-03-02 HISTORY — DX: Dental caries, unspecified: K02.9

## 2012-03-02 LAB — BASIC METABOLIC PANEL
BUN: 15 mg/dL (ref 6–23)
BUN: 16 mg/dL (ref 6–23)
Calcium: 9.2 mg/dL (ref 8.4–10.5)
Chloride: 87 mEq/L — ABNORMAL LOW (ref 96–112)
Creat: 1.1 mg/dL (ref 0.50–1.10)
GFR calc non Af Amer: 78 mL/min — ABNORMAL LOW (ref >90)
Glucose, Bld: 754 mg/dL (ref 70–99)
Glucose, Bld: 591 mg/dL (ref 70–99)
Potassium: 5.3 mEq/L (ref 3.5–5.3)
Potassium: 5 mEq/L (ref 3.5–5.1)

## 2012-03-02 LAB — BLOOD GAS, ARTERIAL
Acid-Base Excess: 2.1 mmol/L — ABNORMAL HIGH (ref 0.0–2.0)
Bicarbonate: 26.5 mEq/L — ABNORMAL HIGH (ref 20.0–24.0)
O2 Saturation: 96.2 %
TCO2: 27.9 mmol/L (ref 0–100)
pO2, Arterial: 78.8 mmHg — ABNORMAL LOW (ref 80.0–100.0)

## 2012-03-02 LAB — RETICULOCYTES: Retic Count, Absolute: 93.8 10*3/uL (ref 19.0–186.0)

## 2012-03-02 LAB — GLUCOSE, CAPILLARY: Glucose-Capillary: >600 mg/dL (ref 70–99)

## 2012-03-02 LAB — DIFFERENTIAL
Eosinophils Relative: 2 % (ref 0–5)
Lymphocytes Relative: 37 % (ref 12–46)
Monocytes Absolute: 0.3 10*3/uL (ref 0.1–1.0)
Monocytes Relative: 5 % (ref 3–12)
Neutro Abs: 3.5 10*3/uL (ref 1.7–7.7)

## 2012-03-02 LAB — CBC
HCT: 30.7 % — ABNORMAL LOW (ref 36.0–46.0)
Hemoglobin: 10.2 g/dL — ABNORMAL LOW (ref 12.0–15.0)
MCV: 81.9 fL (ref 78.0–100.0)
WBC: 6.1 10*3/uL (ref 4.0–10.5)

## 2012-03-02 MED ORDER — SODIUM CHLORIDE 0.9 % IV SOLN
INTRAVENOUS | Status: DC
  Administered 2012-03-02: 18:00:00 via INTRAVENOUS

## 2012-03-02 MED ORDER — NICOTINE 21 MG/24HR TD PT24
21.0000 mg | MEDICATED_PATCH | Freq: Every day | TRANSDERMAL | Status: DC
  Administered 2012-03-02 – 2012-03-03 (×2): 21 mg via TRANSDERMAL
  Filled 2012-03-02 (×2): qty 1

## 2012-03-02 MED ORDER — AMOXICILLIN 500 MG PO CAPS
500.0000 mg | ORAL_CAPSULE | Freq: Two times a day (BID) | ORAL | Status: DC
  Administered 2012-03-02 – 2012-03-03 (×2): 500 mg via ORAL
  Filled 2012-03-02 (×3): qty 1

## 2012-03-02 MED ORDER — SODIUM CHLORIDE 0.9 % IV SOLN
INTRAVENOUS | Status: DC
  Administered 2012-03-02: 500 mL/h via INTRAVENOUS

## 2012-03-02 MED ORDER — SODIUM CHLORIDE 0.9 % IV SOLN
INTRAVENOUS | Status: DC
  Administered 2012-03-02: 23:00:00 via INTRAVENOUS
  Filled 2012-03-02: qty 1

## 2012-03-02 MED ORDER — ENOXAPARIN SODIUM 40 MG/0.4ML ~~LOC~~ SOLN
40.0000 mg | Freq: Every day | SUBCUTANEOUS | Status: DC
  Administered 2012-03-02: 40 mg via SUBCUTANEOUS
  Filled 2012-03-02 (×2): qty 0.4

## 2012-03-02 MED ORDER — CITALOPRAM HYDROBROMIDE 20 MG PO TABS
20.0000 mg | ORAL_TABLET | Freq: Every day | ORAL | Status: DC
  Administered 2012-03-03: 20 mg via ORAL
  Filled 2012-03-02: qty 1

## 2012-03-02 MED ORDER — HYDROCODONE-ACETAMINOPHEN 5-325 MG PO TABS
1.0000 | ORAL_TABLET | Freq: Four times a day (QID) | ORAL | Status: DC | PRN
  Administered 2012-03-02 – 2012-03-03 (×2): 1 via ORAL
  Filled 2012-03-02 (×2): qty 1

## 2012-03-02 MED ORDER — DEXTROSE 50 % IV SOLN
25.0000 mL | INTRAVENOUS | Status: DC | PRN
  Administered 2012-03-03: 25 mL via INTRAVENOUS
  Filled 2012-03-02: qty 50

## 2012-03-02 MED ORDER — LEVOTHYROXINE SODIUM 75 MCG PO TABS
75.0000 ug | ORAL_TABLET | Freq: Every day | ORAL | Status: DC
  Administered 2012-03-03: 75 ug via ORAL
  Filled 2012-03-02 (×2): qty 1

## 2012-03-02 MED ORDER — TRAMADOL HCL 50 MG PO TABS: 50.0000 mg | ORAL_TABLET | Freq: Four times a day (QID) | ORAL | Status: DC | PRN

## 2012-03-02 MED ORDER — ACETAMINOPHEN 325 MG PO TABS: 650.0000 mg | ORAL_TABLET | Freq: Four times a day (QID) | ORAL | Status: DC | PRN

## 2012-03-02 MED ORDER — SODIUM CHLORIDE 0.9 % IV SOLN: INTRAVENOUS | Status: AC

## 2012-03-02 MED ORDER — SODIUM CHLORIDE 0.9 % IV SOLN
INTRAVENOUS | Status: AC
  Administered 2012-03-02: 22:00:00 via INTRAVENOUS

## 2012-03-02 MED ORDER — SIMVASTATIN 10 MG PO TABS
10.0000 mg | ORAL_TABLET | Freq: Every day | ORAL | Status: DC
  Administered 2012-03-02: 10 mg via ORAL
  Filled 2012-03-02 (×2): qty 1

## 2012-03-02 MED ORDER — AMOXICILLIN 500 MG PO TABS: 500.0000 mg | ORAL_TABLET | Freq: Two times a day (BID) | ORAL | Status: DC

## 2012-03-02 MED ORDER — DEXTROSE-NACL 5-0.45 % IV SOLN
INTRAVENOUS | Status: DC
  Administered 2012-03-03 (×2): via INTRAVENOUS

## 2012-03-02 NOTE — Assessment & Plan Note (Addendum)
Patient is in hyperglycemic state, with CBG >600, patient CBGs are likely elevated secondary to dental infection. Checked stat basic metabolic panel, which shows an anion gap of 18, due to that and the patient's reduced oral intake due to dental pain. We'll admit for hydration and control of her hyperglycemia that if left uncontrolled would likely converts to DKA. Plan discussed with Dr. Eppie Gibson.

## 2012-03-02 NOTE — H&P (Signed)
Date: 03/02/2012               Patient Name:  Angela [name] MRN: [id number]  DOB: 08-Sep-1986 Age / Sex: 26 y.o., female   PCP: [employer], MD, MD              Medical Service: Internal Medicine Teaching Service              Attending Physician: Dr. Lars Mage    First Contact: Dr. Orvilla Fus Pager: [phone]  Second Contact: Dr. Posey Pronto Pager: [phone]            After Hours (After 5pm / weekends / holidays): First Contact Pager: [phone]   Second Contact Pager: [phone]     Chief Complaint: tooth abscess and hyperglycemia  History of Present Illness: Patient is a 26 y.o. female with a PMHx of type 1 diabetes mellitus (uncontrolled), hypothyroidism, HLD, who presents to [facility] as a direct admission from for evaluation of left tooth abscess hyperglycemia. Patient notes over last few months, she has had worsening left tooth pain. Has had prolonged issues with her teeth, had a left molar extracted > 6 months ago.  Over the past month she describes polyuria, polydipsia, and dry mouth with occasional chills. She is a type I DM with last A1c of >14, currently on 70/30 22 and 15 am/pm, says she never misses doses.  Last hospitalized for DKA 2-3 years ago. Patient checking blood sugars only when she feels poorly, which turns out to be almost never.  When she does check her numbers are in the 300s or higher.  0 hypoglycemic episodes since last visit. admits to polyuria, polydipsia. Denies nausea, vomiting, diarrhea.  Endorses some blurry vision at night.   Past Medical History: Past Medical History  Diagnosis Date  . HLD (hyperlipidemia)   . Depression, major     was on multiple medication before followed by psych but was lost to follow up 2-3 years ago when she go arrested, stopped multiple medications that she was on (zoloft, abilify, depakote) , never restarted it  . Anemia     baseline Hb 10-11, ferriting 53  . 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 Hypothyroidism     untreated,  non compliance  . DM type 1 (diabetes mellitus, type 1)     uncontrolled due to medication non compliance, DKA admission at [facility] in 2008    Past Surgical History: No past surgical history on file.  Current Outpatient Medications: Prescriptions prior to admission  Medication Sig Dispense Refill  . amoxicillin (AMOXIL) 500 MG tablet Take 1 tablet (500 mg total) by mouth 2 (two) times daily.  28 tablet  0  . citalopram (CELEXA) 20 MG tablet Take 1 tablet (20 mg total) by mouth daily.  31 tablet  0  . insulin aspart protamine-insulin aspart (NOVOLOG 70/30) (70-30) 100 UNIT/ML injection Inject 15-22 Units into the skin 2 (two) times daily with a meal. Please use 22 units before breakfast and 15 units before dinner everyday.      [name] levothyroxine (SYNTHROID, LEVOTHROID) 75 MCG tablet Take 1 tablet (75 mcg total) by mouth daily.  31 tablet  3  . pravastatin (PRAVACHOL) 20 MG tablet Take 20 mg by mouth every evening.      . traMADol (ULTRAM) 50 MG tablet Take 50 mg by mouth every 6 (six) hours as needed.      [name] DISCONTD: insulin aspart protamine-insulin aspart (NOVOLOG 70/30) (70-30) 100 UNIT/ML injection Please  use 22 units before breakfast and 15 units before dinner everyday.  10 mL  3  . DISCONTD: pravastatin (PRAVACHOL) 20 MG tablet Take 1 tablet (20 mg total) by mouth every evening.  30 tablet  3  . DISCONTD: traMADol (ULTRAM) 50 MG tablet Take 1 tablet (50 mg total) by mouth every 6 (six) hours as needed for pain.  20 tablet  0    Allergies: Allergies  Allergen Reactions  . Ciprofloxacin Hives  . Sulfamethoxazole     Itching   . Trimethoprim     Hives      Family History: Family History  Problem Relation Age of Onset  . Multiple sclerosis Mother   . Hypothyroidism Mother   . Hyperlipidemia Maternal Grandmother   . Hypertension Maternal Grandfather   . Hypertension Maternal Grandmother   . Prostate cancer Maternal Grandfather   . Diabetes type I Maternal Grandfather   .  Breast cancer Paternal Grandmother   . Heart disease Maternal Grandmother     unknown type  . Stroke Mother     at age 42 yo    Social History: History   Social History  . Marital Status: Divorced    Spouse Name: N/A    Number of Children: 0  . Years of Education: 10th grade   Occupational History  . unemployed     worked at a group   Social History Main Topics  . Smoking status: Current Some Day Smoker -- 1.0 packs/day for 2 years    Types: Cigarettes  . Smokeless tobacco: Never Used  . Alcohol Use: No  . Drug Use: No     remote marijuana use, quit in 2011  . Sexually Active: Not on file   Other Topics Concern  . 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 Lives with her mother who is an Glass blower/designer in [employer]. Smoking Status:  Current, Packs/Day:  2-3Sexual History:  multiple partners in the past, same sex encounters,current partner is a CNA and she is planning to move in with herDrug Use:  Past,, marijuana    Review of Systems: Denies CP/SOB, dysuria  Vital Signs: Height 5\' 4"  (1.626 m), weight 158 lb 14.4 oz (72.077 kg), last menstrual period 02/28/2012. 104/70     65       89 F  Physical Exam: GEN: No apparent distress.  Alert and oriented x 3.  Pleasant, conversant, and cooperative to exam. HEENT: NCAT.  Oropharynx shows poor dentition with many caries.  Face is TTP diffusely on L and R.  Neck is TTP on L.  No appreciable LAD, but difficult exam 2/2 pain  EOMI.  PERRLA.  Sclerae anicteric.  Conjunctivae noninjected. MMM. RESP:  Lungs are clear to ascultation bilaterally with good air movement.  No wheezes, ronchi, or rubs. CARDIOVASCULAR: regular rate, normal rhythm.  Clear S1, S2, no murmurs, gallops, or rubs. ABDOMEN: soft, non-tender, non-distended.  Bowels  sounds present in all quadrants and normoactive.  No palpable masses. EXT: warm and dry.  Peripheral pulses equal, intact, and +2 globally.  No clubbing or cyanosis.  No edema in b/l lower extremeties SKIN: warm and dry with normal turgor.  No rashes or abnormal lesions observed. NEURO: CN II-XII grossly intact.  Muscle strength +5/5 in bilateral upper and lower extremities.  Sensation is grossly intact.  No focal deficit.  Lab results: Basic Metabolic Panel:  Basename 03/02/12 1549  NA 125*  K 5.3  CL 87*  CO2 20  GLUCOSE 754*  BUN 15  CREATININE 1.10  CALCIUM 9.4  MG --  PHOS --   CBG:  Basename 03/02/12 1545  GLUCAP >600*   Urine Drug Screen: Drugs of Abuse     Component Value Date/Time   LABOPIA NONE DETECTED 12/09/2007 0419   COCAINSCRNUR NONE DETECTED 12/09/2007 0419   LABBENZ NONE DETECTED 12/09/2007 0419   AMPHETMU NONE DETECTED 12/09/2007 0419   THCU NONE DETECTED 12/09/2007 0419   LABBARB  Value: NONE DETECTED        DRUG SCREEN FOR MEDICAL PURPOSES ONLY.  IF CONFIRMATION IS NEEDED FOR ANY PURPOSE, NOTIFY LAB WITHIN 5 DAYS. 12/09/2007 0419     Imaging results:  Dg Orthopantogram  03/02/2012  *RADIOLOGY REPORT*  Clinical Data: Bilateral posterior upper tooth pain.  ORTHOPANTOGRAM/PANORAMIC  Comparison: None.  Findings: Large dental caries are evident in the third maxillary molars bilaterally.  There is no definite periapical disease.  The multiple other dental fillings are evident.  There is a focal caries within tooth #7 as well.  IMPRESSION:  1.  Large dental caries within the third maxillary molars bilaterally, correlating with the patient's symptoms. 2.  Dental caries within tooth #7.  Original Report Authenticated By: Resa Miner. MATTERN, M.D.    Assessment & Plan: Pt is a 26 y.o. yo female with a PMHx of DMII who was admitted on 03/02/2012 with symptoms of facial pain, polydipsia, and polyuria, which was determined to be secondary to poorly controlled DMI.  Interventions at this time will be focused on reducation of hyperglycemia.  # Hyperglycemia Pt is type I DM with last A1c of greater than 14, p/w glucose over 700 along with polyuria, polydipsia.  Gap of 18 indicates possible DKA, though no UA to confirm ketones at this time.  Possible precipitants include ongoing odontogenic infection, medicine noncompliance. - glucostabilizer protocol which includes NS - bmet q2H - ABG  - microalbumin/creatinine - UA - UDS  # Hypothyroidism H/o medicine noncompliance, but reportedly taking meds for past one month. - con't synthroid 7mcg - TSH  # Dental Caries Panorex shows large caries but no abscess.  Pt had tooth extraction 6 months ago but has significant pain on exam.  Afebrile but endorses chills prior to admission. - apap - vicodin - amoxacillin for possible infection (started in clinic) - will need dental f/u, but as always, difficult to arrange.  Inpatient?  # H/O Anemia Wandering baseline, unclear etiology.  Did just finish menses. - CBC - anemia panel  #Tobacco Abuse - smoking cessation - nicotine patch  # Ppx - lovenox

## 2012-03-02 NOTE — Progress Notes (Signed)
#   22 NSL started left hand 5:30PM. Hilda Blades Cybill Uriegas RN 03/02/12 5:45PM

## 2012-03-02 NOTE — Progress Notes (Signed)
Patient ID: Jalliyah Pert, female   DOB: 08-Jan-1986, 26 y.o.   MRN: [id number]  HPI:   Patient is a pleasant 26 year old female, presents to the clinic for an emergency visit due to complaints of dental pain and swelling of her left cheek for the past several days. Patient denies any fever chills, but reports reduced oral intake due to the pain. Reports that her sugars been running higher than normal despite compliance with medications. No other complaints today.  Review of Systems: Negative except per history of present illness  Physical Exam:  Nursing notes and vitals reviewed General:  alert, well-developed, and cooperative to examination.   Lungs:  normal respiratory effort, no accessory muscle use, normal breath sounds, no crackles, and no wheezes. Heart:  normal rate, regular rhythm, no murmurs, no gallop, and no rub.   Abdomen:  soft, non-tender, normal bowel sounds, no distention, no guarding, no rebound tenderness, no hepatomegaly, and no splenomegaly.   Extremities:  No cyanosis, clubbing, edema Neurologic:  alert & oriented X3, nonfocal exam  Meds: Medications Prior to Admission  Medication Sig Dispense Refill  . citalopram (CELEXA) 20 MG tablet Take 1 tablet (20 mg total) by mouth daily.  31 tablet  0  . insulin aspart protamine-insulin aspart (NOVOLOG 70/30) (70-30) 100 UNIT/ML injection Please use 22 units before breakfast and 15 units before dinner everyday.  10 mL  3  . levothyroxine (SYNTHROID, LEVOTHROID) 75 MCG tablet Take 1 tablet (75 mcg total) by mouth daily.  31 tablet  3  . pravastatin (PRAVACHOL) 20 MG tablet Take 1 tablet (20 mg total) by mouth every evening.  30 tablet  3   No current facility-administered medications on file as of 03/02/2012.    Allergies: Sulfamethoxazole and Trimethoprim Past Medical History  Diagnosis Date  . HLD (hyperlipidemia)   . Depression, major     was on multiple medication before followed by psych but was lost to follow up 2-3  years ago when she go arrested, stopped multiple medications that she was on (zoloft, abilify, depakote) , never restarted it  . Anemia     baseline Hb 10-11, ferriting 53  . 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 Hypothyroidism     untreated, non compliance  . DM type 1 (diabetes mellitus, type 1)     uncontrolled due to medication non compliance, DKA admission at [facility] in 2008   No past surgical history on file. No family history on file. History   Social History  . Marital Status: Divorced    Spouse Name: N/A    Number of Children: N/A  . Years of Education: N/A   Occupational History  . Not on file.   Social History Main Topics  . Smoking status: Current Some Day Smoker  . Smokeless tobacco: Not on file  . Alcohol Use: No  . Drug Use: No  . Sexually Active: Not on file   Other Topics Concern  . 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 Lives with her mother who is an Glass blower/designer in [employer]. Smoking Status:  Current, Packs/Day:  2-3Sexual History:  multiple partners in the past, same sex encounters,current partner is a CNA and she is planning to move in with herDrug Use:  Past,, marijuana

## 2012-03-02 NOTE — Assessment & Plan Note (Signed)
Patient has multiple caries and possible abscess, will check an x-ray. Empirically prescribe the patient amoxicillin for a possible infection and tramadol for pain. We'll make an emergent referral to dentistry for further management.

## 2012-03-03 ENCOUNTER — Encounter (HOSPITAL_COMMUNITY): Payer: Self-pay | Admitting: *Deleted

## 2012-03-03 DIAGNOSIS — R7309 Other abnormal glucose: Secondary | ICD-10-CM

## 2012-03-03 LAB — GLUCOSE, CAPILLARY
Glucose-Capillary: 309 mg/dL — ABNORMAL HIGH (ref 70–99)
Glucose-Capillary: 494 mg/dL — ABNORMAL HIGH (ref 70–99)
Glucose-Capillary: 543 mg/dL — ABNORMAL HIGH (ref 70–99)
Glucose-Capillary: 128 mg/dL — ABNORMAL HIGH (ref 70–99)
Glucose-Capillary: 67 mg/dL — ABNORMAL LOW (ref 70–99)
Glucose-Capillary: 84 mg/dL (ref 70–99)

## 2012-03-03 LAB — BASIC METABOLIC PANEL
CO2: 28 mEq/L (ref 19–32)
Chloride: 100 mEq/L (ref 96–112)
Creatinine, Ser: 0.81 mg/dL (ref 0.50–1.10)

## 2012-03-03 LAB — URINALYSIS, ROUTINE W REFLEX MICROSCOPIC
Glucose, UA: >1000 mg/dL — AB
pH: 7.5 (ref 5.0–8.0)

## 2012-03-03 LAB — IRON AND TIBC
Iron: 42 ug/dL (ref 42–135)
Saturation Ratios: 16 % — ABNORMAL LOW (ref 20–55)
TIBC: 270 ug/dL (ref 250–470)
UIBC: 228 ug/dL (ref 125–400)

## 2012-03-03 LAB — URINE MICROSCOPIC-ADD ON

## 2012-03-03 LAB — MICROALBUMIN / CREATININE URINE RATIO: Microalb Creat Ratio: 33.5 mg/g — ABNORMAL HIGH (ref 0.0–30.0)

## 2012-03-03 LAB — RAPID URINE DRUG SCREEN, HOSP PERFORMED
Amphetamines: NOT DETECTED
Benzodiazepines: NOT DETECTED
Cocaine: NOT DETECTED
Opiates: NOT DETECTED

## 2012-03-03 LAB — TSH: TSH: 45.924 u[IU]/mL — ABNORMAL HIGH (ref 0.350–4.500)

## 2012-03-03 LAB — FOLATE: Folate: 15.2 ng/mL

## 2012-03-03 MED ORDER — TRAMADOL HCL 50 MG PO TABS: 50.0000 mg | ORAL_TABLET | Freq: Four times a day (QID) | ORAL | Status: DC | PRN

## 2012-03-03 MED ORDER — INSULIN GLARGINE 100 UNIT/ML ~~LOC~~ SOLN
10.0000 [IU] | Freq: Every day | SUBCUTANEOUS | Status: DC
  Administered 2012-03-03: 10 [IU] via SUBCUTANEOUS

## 2012-03-03 MED ORDER — NICOTINE 21 MG/24HR TD PT24: 1.0000 | MEDICATED_PATCH | Freq: Every day | TRANSDERMAL | Status: AC

## 2012-03-03 MED ORDER — LEVOTHYROXINE SODIUM 100 MCG PO TABS: 100.0000 ug | ORAL_TABLET | Freq: Every day | ORAL | Status: DC

## 2012-03-03 MED ORDER — ACETAMINOPHEN 325 MG PO TABS: 650.0000 mg | ORAL_TABLET | Freq: Four times a day (QID) | ORAL | Status: DC | PRN

## 2012-03-03 MED ORDER — LEVOTHYROXINE SODIUM 150 MCG PO TABS
150.0000 ug | ORAL_TABLET | Freq: Every day | ORAL | Status: DC
  Filled 2012-03-03: qty 1

## 2012-03-03 MED ORDER — TRAMADOL HCL 50 MG PO TABS: 50.0000 mg | ORAL_TABLET | Freq: Four times a day (QID) | ORAL | Status: AC | PRN

## 2012-03-03 MED ORDER — CITALOPRAM HYDROBROMIDE 20 MG PO TABS: 20.0000 mg | ORAL_TABLET | Freq: Every day | ORAL | Status: DC

## 2012-03-03 MED ORDER — AMOXICILLIN 500 MG PO CAPS: 500.0000 mg | ORAL_CAPSULE | Freq: Two times a day (BID) | ORAL | Status: AC

## 2012-03-03 NOTE — H&P (Signed)
Internal Medicine Attending Admission Note Date: 03/03/2012  Patient name: Angela [name] Medical record number: [id number] Date of birth: 06-Mar-1986 Age: 26 y.o. Gender: female  I saw and evaluated the patient. I reviewed the resident's note and I agree with the resident's findings and plan as documented in the resident's note.  Chief Complaint(s):  Mouth pain, thirst, urinary frequency, blurred vision.  History - key components related to admission:  Angela [name] is a 26 year old woman with type 1 diabetes complicated by a previous episode of DKA and poor chronic control who presents to clinic with a chief complaint of one month of mild pain and associated polyuria, polydipsia, and blurry vision. Although no obvious abscesses were found on exam or radiographically she was noted to have an anion gap of 18 and a blood glucose over 700. She stated it was difficult to keep herself hydrated given her mouth pain. She was therefore admitted to observation status for IV hydration, pain control and insulin therapy.  She denies any sick contacts, fevers, shakes, chills, abdominal pain, nausea, vomiting, diarrhea, chest pain, or shortness of breath.  Since starting the tramadol her mouth pain is improved and she was able to eat breakfast without difficulty. Her blood glucose was controlled using a glucomander protocol and she was hydrated with subsequent closure of her gap.  Physical Exam - key components related to admission:  Filed Vitals:   03/03/12 0300 03/03/12 0512 03/03/12 0923 03/03/12 1008  BP: 114/80 102/68 118/77 120/74  Pulse: 91 85 89 87  Temp: 97.4 F (36.3 C) 98 F (36.7 C) 97.7 F (36.5 C) 98.1 F (36.7 C)  TempSrc: Oral Oral Oral Oral  Resp: 20 18 18 18   Height:      Weight:      SpO2: 99% 96% 99% 97%   General: Well-developed, well-nourished, woman lying comfortably in bed in no acute distress. Face: Tender to palpation on the left and right side. Left pain greater than right  pain. Neck: Painful to palpation on the left neck. No adenopathy was appreciated. No crepitus was appreciated. Lungs: Clear to auscultation bilaterally. Heart: Regular rate and rhythm without murmurs, rubs, or gallops. Abdomen: Soft, non tender, active bowel sounds without masses or hepatosplenomegaly. Extremities: Without edema.  Lab results:  Basic Metabolic Panel:  Basename 03/03/12 0710 03/02/12 2142  NA 137 128*  K 3.8 5.0  CL 100 91*  CO2 28 27  GLUCOSE 73 591*  BUN 11 16  CREATININE 0.81 0.99  CALCIUM 9.3 9.2  MG -- --  PHOS -- --   CBC:  Basename 03/02/12 2142  WBC 6.1  NEUTROABS 3.5  HGB 10.2*  HCT 30.7*  MCV 81.9  PLT 169   CBG:  Basename 03/03/12 1126 03/03/12 0755 03/03/12 0629 03/03/12 0510 03/03/12 0427 03/03/12 0324  GLUCAP 340* 67* 84 128* 51* 95   Anemia Panel:  Basename 03/02/12 2142  VITAMINB12 --  FOLATE --  FERRITIN --  TIBC --  IRON --  RETICCTPCT 2.5   Imaging results:  Dg Orthopantogram  03/02/2012  *RADIOLOGY REPORT*  Clinical Data: Bilateral posterior upper tooth pain.  ORTHOPANTOGRAM/PANORAMIC  Comparison: None.  Findings: Large dental caries are evident in the third maxillary molars bilaterally.  There is no definite periapical disease.  The multiple other dental fillings are evident.  There is a focal caries within tooth #7 as well.  IMPRESSION:  1.  Large dental caries within the third maxillary molars bilaterally, correlating with the patient's symptoms. 2.  Dental caries  within tooth #7.  Original Report Authenticated By: Resa Miner. MATTERN, M.D.   Assessment & Plan by Problem:  Angela [name] is a 26 year old woman with chronically poorly controlled type 1 diabetes mellitus who presents with a one-month history of mouth pain likely related to dental caries and possible subclinical infection resulting in worsening of diabetic control and a mild diabetic ketoacidosis. With her inability to keep herself well hydrated because of oral  pain she required admission for IV hydration and pain management. She is markedly improved with these interventions.  1) Diabetes: Diabetic ketoacidosis appropriately treated with closure of her anion gap. Her pain is much improved and she was able to tolerate an oral breakfast this morning. The antibiotics should treat any underlying oral infection and this will hopefully improve her diabetic control. Most importantly, is her need for compliance with or insulin regimen. This was stressed to her. If she tolerates lunch well we plan on discharging her later today with follow up with her primary care provider within the week.

## 2012-03-03 NOTE — Discharge Summary (Signed)
DISCHARGE SUMMARY  Patient Name:  Angela [name]  MRN: [id number]  PCP: Lester Aurora, MD, MD  DOB:  [birth year]     CSN NUMBER :   Date of Admission:  03/02/2012  Date of Discharge:  03/03/2012      Attending Physician: Dr. Karren Cobble, MD         DISCHARGE DIAGNOSES: 1.  DIABETES MELLITUS, TYPE I with DKA- last hemoglobin A1c more than 14 on 02/03/2012. 2.  UNSPECIFIED ANEMIA 3.  DEPRESSION 4.  GERD (gastroesophageal reflux disease) 5.  Hyperlipidemia 6.  Dental caries   DISPOSITION AND FOLLOW-UP: Angela [name] is to follow-up with the [facility] outpatient [facility] in 7-10 days. Please check her CBG, BMP and compliance with medications. Also make sure that she has an appointment with dentist for her dental caries.   Discharge Orders    Future Orders Please Complete By Expires   Diet - low sodium heart healthy      Increase activity slowly      Discharge instructions      Comments:   Clinic will call you with appointment day and time. You will probably need to come in 7-10 days. Please take Amoxicillin 500 mg twice daily for 14 days for your tooth pain and infection. Call [phone] for further questions or concerns meanwhile. Please take all your medications including insulin regularly.    Call MD for:  temperature >100.4      Call MD for:  persistant nausea and vomiting      Call MD for:  severe uncontrolled pain      Call MD for:  persistant dizziness or light-headedness          DISCHARGE MEDICATIONS: Medication List  As of 03/03/2012  1:12 PM   STOP taking these medications         amoxicillin 500 MG tablet         TAKE these medications         acetaminophen 325 MG tablet   Commonly known as: TYLENOL   Take 2 tablets (650 mg total) by mouth every 6 (six) hours as needed.      amoxicillin 500 MG capsule   Commonly known as: AMOXIL   Take 1 capsule (500 mg total) by mouth 2 (two) times daily.      citalopram 20 MG tablet   Commonly known as: CELEXA     Take 1 tablet (20 mg total) by mouth daily.      insulin aspart protamine-insulin aspart (70-30) 100 UNIT/ML injection   Commonly known as: NOVOLOG 70/30   Inject 15-22 Units into the skin 2 (two) times daily with a meal. Please use 22 units before breakfast and 15 units before dinner everyday.      levothyroxine 75 MCG tablet   Commonly known as: SYNTHROID, LEVOTHROID   Take 1 tablet (75 mcg total) by mouth daily.      nicotine 21 mg/24hr patch   Commonly known as: NICODERM CQ - dosed in mg/24 hours   Place 1 patch onto the skin daily.      pravastatin 20 MG tablet   Commonly known as: PRAVACHOL   Take 20 mg by mouth every evening.      traMADol 50 MG tablet   Commonly known as: ULTRAM   Take 50 mg by mouth every 6 (six) hours as needed.             CONSULTS:    none   PROCEDURES PERFORMED:  Dg  Orthopantogram  03/02/2012  *RADIOLOGY REPORT*  Clinical Data: Bilateral posterior upper tooth pain.  ORTHOPANTOGRAM/PANORAMIC  Comparison: None.  Findings: Large dental caries are evident in the third maxillary molars bilaterally.  There is no definite periapical disease.  The multiple other dental fillings are evident.  There is a focal caries within tooth #7 as well.  IMPRESSION:  1.  Large dental caries within the third maxillary molars bilaterally, correlating with the patient's symptoms. 2.  Dental caries within tooth #7.  Original Report Authenticated By: Resa Miner. MATTERN, M.D.       ADMISSION DATA: H&P: Patient is a 26 y.o. female with a PMHx of type 1 diabetes mellitus (uncontrolled), hypothyroidism, HLD, who presents to [facility] as a direct admission from for evaluation of left tooth abscess hyperglycemia. Patient notes over last few months, she has had worsening left tooth pain. Has had prolonged issues with her teeth, had a left molar extracted > 6 months ago.  Over the past month she describes polyuria, polydipsia, and dry mouth with occasional chills. She is a type I  DM with last A1c of >14, currently on 70/30 22 and 15 am/pm, says she never misses doses. Last hospitalized for DKA 2-3 years ago. Patient checking blood sugars only when she feels poorly, which turns out to be almost never. When she does check her numbers are in the 300s or higher. 0 hypoglycemic episodes since last visit. admits to polyuria, polydipsia. Denies nausea, vomiting, diarrhea. Endorses some blurry vision at night.      Physical Exam: GEN: No apparent distress. Alert and oriented x 3. Pleasant, conversant, and cooperative to exam.  HEENT: NCAT. Oropharynx shows poor dentition with many caries. Face is TTP diffusely on L and R. Neck is TTP on L. No appreciable LAD, but difficult exam 2/2 pain EOMI. PERRLA. Sclerae anicteric. Conjunctivae noninjected. MMM.  RESP: Lungs are clear to ascultation bilaterally with good air movement. No wheezes, ronchi, or rubs.  CARDIOVASCULAR: regular rate, normal rhythm. Clear S1, S2, no murmurs, gallops, or rubs.  ABDOMEN: soft, non-tender, non-distended. Bowels sounds present in all quadrants and normoactive. No palpable masses.  EXT: warm and dry. Peripheral pulses equal, intact, and +2 globally. No clubbing or cyanosis. No edema in b/l lower extremeties  SKIN: warm and dry with normal turgor. No rashes or abnormal lesions observed.  NEURO: CN II-XII grossly intact. Muscle strength +5/5 in bilateral upper and lower extremities. Sensation is grossly intact. No focal deficit.   Labs: Basic Metabolic Panel:   Basename  03/02/12 1549   NA  125*   K  5.3   CL  87*   CO2  20   GLUCOSE  754*   BUN  15   CREATININE  1.10   CALCIUM  9.4   MG  --   PHOS  --    CBG:   Basename  03/02/12 1545   GLUCAP  >600*    Urine Drug Screen:  Drugs of Abuse    Component  Value  Date/Time    LABOPIA  NONE DETECTED  12/09/2007 0419    COCAINSCRNUR  NONE DETECTED  12/09/2007 0419    LABBENZ  NONE DETECTED  12/09/2007 0419    AMPHETMU  NONE DETECTED   12/09/2007 0419    THCU  NONE DETECTED  12/09/2007 0419    LABBARB  Value: NONE DETECTED DRUG SCREEN FOR MEDICAL PURPOSES ONLY. IF CONFIRMATION IS NEEDED FOR ANY PURPOSE, NOTIFY LAB WITHIN 5 DAYS.  12/09/2007 [id number]  HOSPITAL COURSE:  Pt is a 26 y.o. yo female with a PMHx of DMII who was admitted on 03/02/2012 with symptoms of facial pain, polydipsia, and polyuria, which was determined to be secondary to poorly controlled DMI.   1) Uncontrolled type 1 DM with DKA. Pt is type I DM with last A1c of greater than 14, p/w glucose over 700 along with polyuria, polydipsia. Gap of 18 indicates possible DKA likely due to ongoing odontogenic infection, medicine noncompliance.  Patient was started on glucose stabilizer and her gap closed quickly overnight. She started on clear liquid diet and advanced to full diet by lunchtime and was discharged home after she tolerated diet. - She was continued on same home regimen of Novolin 70/30- 22 units in a.m. and 15 units in p.m. - She'll be seen back in outpatient clinic in 7-10 days and please be check BMP at that time along with CBG. - She needs more diabetes education and understanding of barriers to her therapy.  2) Hypothyroidism  H/o medicine noncompliance, but reportedly taking meds for past one month.  - Will increase synthroid 160mcg  - TSH - 46. Recheck TSH in 3 months   3) Dental Caries  Panorex showed large caries but no abscess. Pt had tooth extraction 6 months ago but has significant pain on exam. Afebrile and vital stable during hospital stay. -Will sent home on amoxicillin 500 mg twice daily for 14 days and followup in clinic with outpatient dental followup. - Will give her tramadol for pain.  4) Anemia : Hemoglobin 10 on admission. Wandering baseline, unclear etiology.  Anemia panel showed no signs of iron deficiency, folate or vitamin B 12 deficiency.  5)Tobacco Abuse  We'll give prescription for nicotine patch    DISCHARGE  DATA: Vital Signs: BP 120/74  Pulse 87  Temp(Src) 98.1 F (36.7 C) (Oral)  Resp 18  Ht 5\' 4"  (1.626 m)  Wt 158 lb 4.6 oz (71.8 kg)  BMI 27.17 kg/m2  SpO2 97%  LMP 02/28/2012  Labs: Results for orders placed during the hospital encounter of 03/02/12 (from the past 24 hour(s))  BLOOD GAS, ARTERIAL     Status: Abnormal   Collection Time   03/02/12  9:35 PM      Component Value Range   FIO2 0.21     Delivery systems ROOM AIR     pH, Arterial 7.390  7.350 - 7.400    pCO2 arterial 44.8  35.0 - 45.0 (mmHg)   pO2, Arterial 78.8 (*) 80.0 - 100.0 (mmHg)   Bicarbonate 26.5 (*) 20.0 - 24.0 (mEq/L)   TCO2 27.9  0 - 100 (mmol/L)   Acid-Base Excess 2.1 (*) 0.0 - 2.0 (mmol/L)   O2 Saturation 96.2     Patient temperature 98.6     Collection site RIGHT RADIAL     Drawn by COLLECTED BY RT     Sample type ARTERIAL DRAW     Allens test (pass/fail) PASS  PASS   BASIC METABOLIC PANEL     Status: Abnormal   Collection Time   03/02/12  9:42 PM      Component Value Range   Sodium 128 (*) 135 - 145 (mEq/L)   Potassium 5.0  3.5 - 5.1 (mEq/L)   Chloride 91 (*) 96 - 112 (mEq/L)   CO2 27  19 - 32 (mEq/L)   Glucose, Bld 591 (*) 70 - 99 (mg/dL)   BUN 16  6 - 23 (mg/dL)   Creatinine, Ser 0.99  0.50 -  1.10 (mg/dL)   Calcium 9.2  8.4 - 10.5 (mg/dL)   GFR calc non Af Amer 78 (*) >90 (mL/min)   GFR calc Af Amer >90  >90 (mL/min)  TSH     Status: Abnormal   Collection Time   03/02/12  9:42 PM      Component Value Range   TSH 45.924 (*) 0.350 - 4.500 (uIU/mL)  CBC     Status: Abnormal   Collection Time   03/02/12  9:42 PM      Component Value Range   WBC 6.1  4.0 - 10.5 (K/uL)   RBC 3.75 (*) 3.87 - 5.11 (MIL/uL)   Hemoglobin 10.2 (*) 12.0 - 15.0 (g/dL)   HCT 30.7 (*) 36.0 - 46.0 (%)   MCV 81.9  78.0 - 100.0 (fL)   MCH 27.2  26.0 - 34.0 (pg)   MCHC 33.2  30.0 - 36.0 (g/dL)   RDW 12.3  11.5 - 15.5 (%)   Platelets 169  150 - 400 (K/uL)  DIFFERENTIAL     Status: Normal   Collection Time   03/02/12   9:42 PM      Component Value Range   Neutrophils Relative 56  43 - 77 (%)   Neutro Abs 3.5  1.7 - 7.7 (K/uL)   Lymphocytes Relative 37  12 - 46 (%)   Lymphs Abs 2.3  0.7 - 4.0 (K/uL)   Monocytes Relative 5  3 - 12 (%)   Monocytes Absolute 0.3  0.1 - 1.0 (K/uL)   Eosinophils Relative 2  0 - 5 (%)   Eosinophils Absolute 0.1  0.0 - 0.7 (K/uL)   Basophils Relative 0  0 - 1 (%)   Basophils Absolute 0.0  0.0 - 0.1 (K/uL)  VITAMIN B12     Status: Normal   Collection Time   03/02/12  9:42 PM      Component Value Range   Vitamin B-12 385  211 - 911 (pg/mL)  FOLATE     Status: Normal   Collection Time   03/02/12  9:42 PM      Component Value Range   Folate 15.2    IRON AND TIBC     Status: Abnormal   Collection Time   03/02/12  9:42 PM      Component Value Range   Iron 42  42 - 135 (ug/dL)   TIBC 270  250 - 470 (ug/dL)   Saturation Ratios 16 (*) 20 - 55 (%)   UIBC 228  125 - 400 (ug/dL)  FERRITIN     Status: Normal   Collection Time   03/02/12  9:42 PM      Component Value Range   Ferritin 47  10 - 291 (ng/mL)  RETICULOCYTES     Status: Abnormal   Collection Time   03/02/12  9:42 PM      Component Value Range   Retic Ct Pct 2.5  0.4 - 3.1 (%)   RBC. 3.75 (*) 3.87 - 5.11 (MIL/uL)   Retic Count, Manual 93.8  19.0 - 186.0 (K/uL)  GLUCOSE, CAPILLARY     Status: Abnormal   Collection Time   03/02/12 10:20 PM      Component Value Range   Glucose-Capillary 543 (*) 70 - 99 (mg/dL)   Comment 1 Documented in Chart     Comment 2 Notify RN    GLUCOSE, CAPILLARY     Status: Abnormal   Collection Time   03/02/12 11:40 PM  Component Value Range   Glucose-Capillary 494 (*) 70 - 99 (mg/dL)   Comment 1 Documented in Chart     Comment 2 Notify RN    GLUCOSE, CAPILLARY     Status: Abnormal   Collection Time   03/03/12 12:49 AM      Component Value Range   Glucose-Capillary 429 (*) 70 - 99 (mg/dL)  GLUCOSE, CAPILLARY     Status: Abnormal   Collection Time   03/03/12  2:31 AM       Component Value Range   Glucose-Capillary 309 (*) 70 - 99 (mg/dL)  GLUCOSE, CAPILLARY     Status: Normal   Collection Time   03/03/12  3:24 AM      Component Value Range   Glucose-Capillary 95  70 - 99 (mg/dL)   Comment 1 Documented in Chart     Comment 2 Notify RN    GLUCOSE, CAPILLARY     Status: Abnormal   Collection Time   03/03/12  4:27 AM      Component Value Range   Glucose-Capillary 51 (*) 70 - 99 (mg/dL)  GLUCOSE, CAPILLARY     Status: Abnormal   Collection Time   03/03/12  5:10 AM      Component Value Range   Glucose-Capillary 128 (*) 70 - 99 (mg/dL)  MICROALBUMIN / CREATININE URINE RATIO     Status: Abnormal   Collection Time   03/03/12  6:20 AM      Component Value Range   Microalb, Ur 0.70  0.00 - 1.89 (mg/dL)   Creatinine, Urine 20.9     Microalb Creat Ratio 33.5 (*) 0.0 - 30.0 (mg/g)  URINE RAPID DRUG SCREEN (HOSP PERFORMED)     Status: Normal   Collection Time   03/03/12  6:20 AM      Component Value Range   Opiates NONE DETECTED  NONE DETECTED    Cocaine NONE DETECTED  NONE DETECTED    Benzodiazepines NONE DETECTED  NONE DETECTED    Amphetamines NONE DETECTED  NONE DETECTED    Tetrahydrocannabinol NONE DETECTED  NONE DETECTED    Barbiturates NONE DETECTED  NONE DETECTED   URINALYSIS, ROUTINE W REFLEX MICROSCOPIC     Status: Abnormal   Collection Time   03/03/12  6:20 AM      Component Value Range   Color, Urine YELLOW  YELLOW    APPearance CLEAR  CLEAR    Specific Gravity, Urine <1.005 (*) 1.005 - 1.030    pH 7.5  5.0 - 8.0    Glucose, UA >1000 (*) NEGATIVE (mg/dL)   Hgb urine dipstick TRACE (*) NEGATIVE    Bilirubin Urine NEGATIVE  NEGATIVE    Ketones, ur NEGATIVE  NEGATIVE (mg/dL)   Protein, ur NEGATIVE  NEGATIVE (mg/dL)   Urobilinogen, UA 0.2  0.0 - 1.0 (mg/dL)   Nitrite NEGATIVE  NEGATIVE    Leukocytes, UA TRACE (*) NEGATIVE   URINE MICROSCOPIC-ADD ON     Status: Abnormal   Collection Time   03/03/12  6:20 AM      Component Value Range   Squamous  Epithelial / LPF FEW (*) RARE    WBC, UA 3-6  <3 (WBC/hpf)   RBC / HPF 3-6  <3 (RBC/hpf)   Bacteria, UA FEW (*) RARE    Urine-Other RARE YEAST    GLUCOSE, CAPILLARY     Status: Normal   Collection Time   03/03/12  6:29 AM      Component Value Range  Glucose-Capillary 84  70 - 99 (mg/dL)   Comment 1 Documented in Chart     Comment 2 Notify RN    BASIC METABOLIC PANEL     Status: Normal   Collection Time   03/03/12  7:10 AM      Component Value Range   Sodium 137  135 - 145 (mEq/L)   Potassium 3.8  3.5 - 5.1 (mEq/L)   Chloride 100  96 - 112 (mEq/L)   CO2 28  19 - 32 (mEq/L)   Glucose, Bld 73  70 - 99 (mg/dL)   BUN 11  6 - 23 (mg/dL)   Creatinine, Ser 0.81  0.50 - 1.10 (mg/dL)   Calcium 9.3  8.4 - 10.5 (mg/dL)   GFR calc non Af Amer >90  >90 (mL/min)   GFR calc Af Amer >90  >90 (mL/min)  GLUCOSE, CAPILLARY     Status: Abnormal   Collection Time   03/03/12  7:55 AM      Component Value Range   Glucose-Capillary 67 (*) 70 - 99 (mg/dL)  GLUCOSE, CAPILLARY     Status: Abnormal   Collection Time   03/03/12 11:26 AM      Component Value Range   Glucose-Capillary 340 (*) 70 - 99 (mg/dL)      Signed: Criss Alvine, MD PGY 2, Internal Medicine Resident 03/03/2012, 1:12 PM

## 2012-03-03 NOTE — Plan of Care (Signed)
Problem: Consults Goal: Diagnosis-Diabetes Mellitus Hyperglycemia     

## 2012-03-05 NOTE — Progress Notes (Signed)
Pt taken to Room 3013 via w/c 7:10PM and report was given to nurse. Hilda Blades Evin Loiseau RN 03/05/12 8:20AM

## 2012-03-13 ENCOUNTER — Encounter: Payer: Self-pay | Admitting: Internal Medicine

## 2012-03-26 ENCOUNTER — Encounter: Payer: Self-pay | Admitting: Internal Medicine

## 2012-04-23 ENCOUNTER — Other Ambulatory Visit: Payer: Self-pay | Admitting: *Deleted

## 2012-04-23 DIAGNOSIS — F3289 Other specified depressive episodes: Secondary | ICD-10-CM

## 2012-04-23 DIAGNOSIS — F329 Major depressive disorder, single episode, unspecified: Secondary | ICD-10-CM

## 2012-04-23 MED ORDER — CITALOPRAM HYDROBROMIDE 20 MG PO TABS: 20.0000 mg | ORAL_TABLET | Freq: Every day | ORAL | Status: DC

## 2012-04-23 MED ORDER — INSULIN ASPART PROT & ASPART (70-30 MIX) 100 UNIT/ML ~~LOC~~ SUSP: 15.0000 [IU] | Freq: Two times a day (BID) | SUBCUTANEOUS | Status: DC

## 2012-04-25 ENCOUNTER — Emergency Department (HOSPITAL_COMMUNITY): Payer: Self-pay

## 2012-04-25 ENCOUNTER — Emergency Department (HOSPITAL_COMMUNITY)
Admission: EM | Admit: 2012-04-25 | Discharge: 2012-04-25 | Disposition: A | Discharge status: Home / Self Care | Payer: Self-pay | Attending: Emergency Medicine | Admitting: Emergency Medicine

## 2012-04-25 ENCOUNTER — Telehealth: Payer: Self-pay | Admitting: *Deleted

## 2012-04-25 ENCOUNTER — Encounter (HOSPITAL_COMMUNITY): Payer: Self-pay | Admitting: Emergency Medicine

## 2012-04-25 DIAGNOSIS — R05 Cough: Secondary | ICD-10-CM | POA: Insufficient documentation

## 2012-04-25 DIAGNOSIS — F17200 Nicotine dependence, unspecified, uncomplicated: Secondary | ICD-10-CM | POA: Insufficient documentation

## 2012-04-25 DIAGNOSIS — E119 Type 2 diabetes mellitus without complications: Secondary | ICD-10-CM | POA: Insufficient documentation

## 2012-04-25 DIAGNOSIS — R059 Cough, unspecified: Secondary | ICD-10-CM | POA: Insufficient documentation

## 2012-04-25 DIAGNOSIS — Z794 Long term (current) use of insulin: Secondary | ICD-10-CM | POA: Insufficient documentation

## 2012-04-25 DIAGNOSIS — J40 Bronchitis, not specified as acute or chronic: Secondary | ICD-10-CM | POA: Insufficient documentation

## 2012-04-25 DIAGNOSIS — R739 Hyperglycemia, unspecified: Secondary | ICD-10-CM

## 2012-04-25 DIAGNOSIS — E785 Hyperlipidemia, unspecified: Secondary | ICD-10-CM | POA: Insufficient documentation

## 2012-04-25 DIAGNOSIS — J45909 Unspecified asthma, uncomplicated: Secondary | ICD-10-CM | POA: Insufficient documentation

## 2012-04-25 LAB — BLOOD GAS, VENOUS
Acid-base deficit: 5 mmol/L — ABNORMAL HIGH (ref 0.0–2.0)
Bicarbonate: 21.6 mEq/L (ref 20.0–24.0)
FIO2: 0.21 %
O2 Saturation: 52.8 %
TCO2: 20.6 mmol/L (ref 0–100)
pCO2, Ven: 49.6 mmHg (ref 45.0–50.0)
pO2, Ven: 32.7 mmHg (ref 30.0–45.0)

## 2012-04-25 LAB — URINALYSIS, ROUTINE W REFLEX MICROSCOPIC
Glucose, UA: >1000 mg/dL — AB
Protein, ur: NEGATIVE mg/dL
pH: 6 (ref 5.0–8.0)

## 2012-04-25 LAB — GLUCOSE, CAPILLARY
Glucose-Capillary: 312 mg/dL — ABNORMAL HIGH (ref 70–99)
Glucose-Capillary: 168 mg/dL — ABNORMAL HIGH (ref 70–99)

## 2012-04-25 LAB — CBC
Hemoglobin: 12 g/dL (ref 12.0–15.0)
MCH: 26.5 pg (ref 26.0–34.0)
MCV: 78.6 fL (ref 78.0–100.0)
RBC: 4.53 MIL/uL (ref 3.87–5.11)

## 2012-04-25 LAB — BASIC METABOLIC PANEL
CO2: 24 mEq/L (ref 19–32)
Calcium: 10 mg/dL (ref 8.4–10.5)
Chloride: 88 mEq/L — ABNORMAL LOW (ref 96–112)
Creatinine, Ser: 0.96 mg/dL (ref 0.50–1.10)
Glucose, Bld: 485 mg/dL — ABNORMAL HIGH (ref 70–99)
Sodium: 128 mEq/L — ABNORMAL LOW (ref 135–145)

## 2012-04-25 LAB — PREGNANCY, URINE: Preg Test, Ur: NEGATIVE

## 2012-04-25 MED ORDER — INSULIN REGULAR HUMAN 100 UNIT/ML IJ SOLN: 15.0000 [IU] | Freq: Once | INTRAMUSCULAR | Status: DC

## 2012-04-25 MED ORDER — IPRATROPIUM BROMIDE 0.02 % IN SOLN
0.5000 mg | Freq: Once | RESPIRATORY_TRACT | Status: AC
  Administered 2012-04-25: 0.5 mg via RESPIRATORY_TRACT
  Filled 2012-04-25: qty 2.5

## 2012-04-25 MED ORDER — ALBUTEROL SULFATE HFA 108 (90 BASE) MCG/ACT IN AERS: 1.0000 | INHALATION_SPRAY | Freq: Four times a day (QID) | RESPIRATORY_TRACT | Status: DC | PRN

## 2012-04-25 MED ORDER — INSULIN ASPART 100 UNIT/ML ~~LOC~~ SOLN
10.0000 [IU] | Freq: Once | SUBCUTANEOUS | Status: AC
  Administered 2012-04-25: 10 [IU] via SUBCUTANEOUS
  Filled 2012-04-25: qty 1

## 2012-04-25 MED ORDER — INSULIN REGULAR HUMAN 100 UNIT/ML IJ SOLN: 10.0000 [IU] | Freq: Once | INTRAMUSCULAR | Status: DC

## 2012-04-25 MED ORDER — SODIUM CHLORIDE 0.9 % IV BOLUS (SEPSIS)
1000.0000 mL | Freq: Once | INTRAVENOUS | Status: AC
  Administered 2012-04-25: 1000 mL via INTRAVENOUS

## 2012-04-25 MED ORDER — ALBUTEROL SULFATE (5 MG/ML) 0.5% IN NEBU
5.0000 mg | INHALATION_SOLUTION | Freq: Once | RESPIRATORY_TRACT | Status: AC
  Administered 2012-04-25: 5 mg via RESPIRATORY_TRACT
  Filled 2012-04-25: qty 1

## 2012-04-25 MED ORDER — INSULIN ASPART 100 UNIT/ML ~~LOC~~ SOLN: 15.0000 [IU] | Freq: Once | SUBCUTANEOUS | Status: DC

## 2012-04-25 MED ORDER — SODIUM CHLORIDE 0.9 % IV SOLN
INTRAVENOUS | Status: DC
  Administered 2012-04-25: 5.1 [IU]/h via INTRAVENOUS
  Filled 2012-04-25: qty 1

## 2012-04-25 NOTE — Discharge Instructions (Signed)
Bronchitis Bronchitis is the body's way of reacting to injury and/or infection (inflammation) of the bronchi. Bronchi are the air tubes that extend from the windpipe into the lungs. If the inflammation becomes severe, it may cause shortness of breath. CAUSES  Inflammation may be caused by:  A virus.   Germs (bacteria).   Dust.   Allergens.   Pollutants and many other irritants.  The cells lining the bronchial tree are covered with tiny hairs (cilia). These constantly beat upward, away from the lungs, toward the mouth. This keeps the lungs free of pollutants. When these cells become too irritated and are unable to do their job, mucus begins to develop. This causes the characteristic cough of bronchitis. The cough clears the lungs when the cilia are unable to do their job. Without either of these protective mechanisms, the mucus would settle in the lungs. Then you would develop pneumonia. Smoking is a common cause of bronchitis and can contribute to pneumonia. Stopping this habit is the single most important thing you can do to help yourself. TREATMENT   Your caregiver may prescribe an antibiotic if the cough is caused by bacteria. Also, medicines that open up your airways make it easier to breathe. Your caregiver may also recommend or prescribe an expectorant. It will loosen the mucus to be coughed up. Only take over-the-counter or prescription medicines for pain, discomfort, or fever as directed by your caregiver.   Removing whatever causes the problem (smoking, for example) is critical to preventing the problem from getting worse.   Cough suppressants may be prescribed for relief of cough symptoms.   Inhaled medicines may be prescribed to help with symptoms now and to help prevent problems from returning.   For those with recurrent (chronic) bronchitis, there may be a need for steroid medicines.  SEEK IMMEDIATE MEDICAL CARE IF:   During treatment, you develop more pus-like mucus  (purulent sputum).   You have a fever.   Your baby is older than 3 months with a rectal temperature of 102 F (38.9 C) or higher.   Your baby is 56 months old or younger with a rectal temperature of 100.4 F (38 C) or higher.   You become progressively more ill.   You have increased difficulty breathing, wheezing, or shortness of breath.  It is necessary to seek immediate medical care if you are elderly or sick from any other disease. MAKE SURE YOU:   Understand these instructions.   Will watch your condition.   Will get help right away if you are not doing well or get worse.  Document Released: 12/05/2005 Document Revised: 11/24/2011 Document Reviewed: 10/14/2008 [facility] Patient Information 2012 [employer].Diabetes, Frequently Asked Questions WHAT IS DIABETES? Most of the food we eat is turned into glucose (sugar). Our bodies use it for energy. The pancreas makes a hormone called insulin. It helps glucose get into the cells of our bodies. When you have diabetes, your body either does not make enough insulin or cannot use its own insulin as well as it should. This causes sugars to build up in your blood. WHAT ARE THE SYMPTOMS OF DIABETES?  Frequent urination.   Excessive thirst.   Unexplained weight loss.   Extreme hunger.   Blurred vision.   Tingling or numbness in hands or feet.   Feeling very tired much of the time.   Dry, itchy skin.   Sores that are slow to heal.   Yeast infections.  WHAT ARE THE TYPES OF DIABETES? Type 1  Diabetes   About 10% of affected people have this type.   Usually occurs before the age of 80.   Usually occurs in thin to normal weight people.  Type 2 Diabetes  About 90% of affected people have this type.   Usually occurs after the age of 58.   Usually occurs in overweight people.   More likely to have:   A family history of diabetes.   A history of diabetes during pregnancy (gestational diabetes).   High blood  pressure.   High cholesterol and triglycerides.  Gestational Diabetes  Occurs in about 4% of pregnancies.   Usually goes away after the baby is born.   More likely to occur in women with:   Family history of diabetes.   Previous gestational diabetes.   Obese.   Over 58 years old.  WHAT IS PRE-DIABETES? Pre-diabetes means your blood glucose is higher than normal, but lower than the diabetes range. It also means you are at risk of getting type 2 diabetes and heart disease. If you are told you have pre-diabetes, have your blood glucose checked again in 1 to 2 years. WHAT IS THE TREATMENT FOR DIABETES? Treatment is aimed at keeping blood glucose near normal levels at all times. Learning how to manage this yourself is important in treating diabetes. Depending on the type of diabetes you have, your treatment will include one or more of the following:  Monitoring your blood glucose.   Meal planning.   Exercise.   Oral medicine (pills) or insulin.  CAN DIABETES BE PREVENTED? With type 1 diabetes, prevention is more difficult, because the triggers that cause it are not yet known. With type 2 diabetes, prevention is more likely, with lifestyle changes:  Maintain a healthy weight.   Eat healthy.   Exercise.  IS THERE A CURE FOR DIABETES? No, there is no cure for diabetes. There is a lot of research going on that is looking for a cure, and progress is being made. Diabetes can be treated and controlled. People with diabetes can manage their diabetes and lead normal, active lives. SHOULD I BE TESTED FOR DIABETES? If you are at least 27 years old, you should be tested for diabetes. You should be tested again every 3 years. If you are 53 or older and overweight, you may want to get tested more often. If you are younger than 73, overweight, and have one or more of the following risk factors, you should be tested:  Family history of diabetes.   Inactive lifestyle.   High blood pressure.   WHAT ARE SOME OTHER SOURCES FOR INFORMATION ON DIABETES? The following organizations may help in your search for more information on diabetes: National Diabetes Education Program (NDEP) Internet: BloggerBowl.es American Diabetes Association Internet: http://www.diabetes.org  Juvenile Diabetes Foundation International Internet: AffordableSalon.es Document Released: 12/08/2003 Document Revised: 11/24/2011 Document Reviewed: 10/02/2009 [facility] Patient Information 2012 [place].Hyperglycemia Hyperglycemia occurs when the glucose (sugar) in your blood is too high. Hyperglycemia can happen for many reasons, but it most often happens to people who do not know they have diabetes or are not managing their diabetes properly.  CAUSES  Whether you have diabetes or not, there are other causes of hyperglycemia. Hyperglycemia can occur when you have diabetes, but it can also occur in other situations that you might not be as aware of, such as: Diabetes  If you have diabetes and are having problems controlling your blood glucose, hyperglycemia could occur because of some of the following reasons:  Not following your meal plan.   Not taking your diabetes medications or not taking it properly.   Exercising less or doing less activity than you normally do.   Being sick.  Pre-diabetes  This cannot be ignored. Before people develop Type 2 diabetes, they almost always have "pre-diabetes." This is when your blood glucose levels are higher than normal, but not yet high enough to be diagnosed as diabetes. Research has shown that some long-term damage to the body, especially the heart and circulatory system, may already be occurring during pre-diabetes. If you take action to manage your blood glucose when you have pre-diabetes, you may delay or prevent Type 2 diabetes from developing.  Stress  If you have diabetes, you may be "diet" controlled or on oral medications or insulin to  control your diabetes. However, you may find that your blood glucose is higher than usual in the hospital whether you have diabetes or not. This is often referred to as "stress hyperglycemia." Stress can elevate your blood glucose. This happens because of hormones put out by the body during times of stress. If stress has been the cause of your high blood glucose, it can be followed regularly by your caregiver. That way he/she can make sure your hyperglycemia does not continue to get worse or progress to diabetes.  Steroids  Steroids are medications that act on the infection fighting system (immune system) to block inflammation or infection. One side effect can be a rise in blood glucose. Most people can produce enough extra insulin to allow for this rise, but for those who cannot, steroids make blood glucose levels go even higher. It is not unusual for steroid treatments to "uncover" diabetes that is developing. It is not always possible to determine if the hyperglycemia will go away after the steroids are stopped. A special blood test called an A1c is sometimes done to determine if your blood glucose was elevated before the steroids were started.  SYMPTOMS  Thirsty.   Frequent urination.   Dry mouth.   Blurred vision.   Tired or fatigue.   Weakness.   Sleepy.   Tingling in feet or leg.  DIAGNOSIS  Diagnosis is made by monitoring blood glucose in one or all of the following ways:  A1c test. This is a chemical found in your blood.   Fingerstick blood glucose monitoring.   Laboratory results.  TREATMENT  First, knowing the cause of the hyperglycemia is important before the hyperglycemia can be treated. Treatment may include, but is not be limited to:  Education.   Change or adjustment in medications.   Change or adjustment in meal plan.   Treatment for an illness, infection, etc.   More frequent blood glucose monitoring.   Change in exercise plan.   Decreasing or stopping  steroids.   Lifestyle changes.  HOME CARE INSTRUCTIONS   Test your blood glucose as directed.   Exercise regularly. Your caregiver will give you instructions about exercise. Pre-diabetes or diabetes which comes on with stress is helped by exercising.   Eat wholesome, balanced meals. Eat often and at regular, fixed times. Your caregiver or nutritionist will give you a meal plan to guide your sugar intake.   Being at an ideal weight is important. If needed, losing as little as 10 to 15 pounds may help improve blood glucose levels.  SEEK MEDICAL CARE IF:   You have questions about medicine, activity, or diet.   You continue to have symptoms (problems such as increased thirst,  urination, or weight gain).  SEEK IMMEDIATE MEDICAL CARE IF:   You are vomiting or have diarrhea.   Your breath smells fruity.   You are breathing faster or slower.   You are very sleepy or incoherent.   You have numbness, tingling, or pain in your feet or hands.   You have chest pain.   Your symptoms get worse even though you have been following your caregiver's orders.   If you have any other questions or concerns.  Document Released: 05/31/2001 Document Revised: 11/24/2011 Document Reviewed: 07/27/2009 [facility] Patient Information 2012 [place].

## 2012-04-25 NOTE — ED Provider Notes (Signed)
Complains of nonproductive cough for 2 months denies fever denies other complaint treated with cough medicine with no relief. On exam alert nontoxic . Speaks in paragraphs lungs clear to auscultation, occasional cough. Patient advised to stop smoking albuterol inhaler 2 puffs every 4 hours as needed for cough or shortness of breath   Orlie Dakin, MD 04/25/12 [phone]

## 2012-04-25 NOTE — ED Provider Notes (Signed)
History     CSN: [id number]  Arrival date & time 04/25/12  1024   First MD Initiated Contact with Patient 04/25/12 1128      Chief Complaint  Patient presents with  . Wheezing  . coughing     (Consider location/radiation/quality/duration/timing/severity/associated sxs/prior treatment) HPI  Patient presents to the ED with complaints of cough for 2 weeks that has progressively gotten worse and lead to cough with post tussive vomiting. The patient has a past medical history of asthma, diabetes, hyperactive thyroids, and hyperlipidemia. She states that she has been using her inhalers at home but they arent doing anything. She admits to having throat pain when swallowing. Denies chest pain or difficulty breathing. pts vital signs are stable at this time, she is awake, alert and oriented, speaking in full sentences and responding to questions appropriately.   Past Medical History  Diagnosis Date  . HLD (hyperlipidemia)   . Depression, major     was on multiple medication before followed by psych but was lost to follow up 2-3 years ago when she go arrested, stopped multiple medications that she was on (zoloft, abilify, depakote) , never restarted it  . Anemia     baseline Hb 10-11, ferriting 53  . 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 Hypothyroidism     untreated, non compliance  . DM type 1 (diabetes mellitus, type 1)     uncontrolled due to medication non compliance, DKA admission at [facility] in 2008  . Asthma     History reviewed. No pertinent past surgical history.  Family History  Problem Relation Age of Onset  . Multiple sclerosis Mother   . Hypothyroidism Mother   . Hyperlipidemia Maternal Grandmother   . Hypertension Maternal Grandfather   . Hypertension Maternal Grandmother   . Prostate cancer Maternal Grandfather   . Diabetes type I Maternal Grandfather   . Breast cancer Paternal Grandmother   . Heart disease Maternal Grandmother     unknown type  . Stroke Mother      at age 72 yo    History  Substance Use Topics  . Smoking status: Current Some Day Smoker -- 1.0 packs/day for 2 years    Types: Cigarettes  . Smokeless tobacco: Never Used  . Alcohol Use: No    OB History    Grav Para Term Preterm Abortions TAB SAB Ect Mult Living                  Review of Systems   HEENT: denies blurry vision or change in hearing PULMONARY: Denies difficulty breathing and SOB CARDIAC: denies chest pain or heart palpitations MUSCULOSKELETAL:  denies being unable to ambulate ABDOMEN AL: denies abdominal pain GU: denies loss of bowel or urinary control NEURO: denies numbness and tingling in extremities   Allergies  Ciprofloxacin; Sulfamethoxazole; and Trimethoprim  Home Medications   Current Outpatient Rx  Name Route Sig Dispense Refill  . ALBUTEROL SULFATE HFA 108 (90 BASE) MCG/ACT IN AERS Inhalation Inhale 2 puffs into the lungs every 4 (four) hours as needed. For shortness of breath.    [name] CITALOPRAM HYDROBROMIDE 20 MG PO TABS Oral Take 1 tablet (20 mg total) by mouth daily. 30 tablet 5  . INSULIN ASPART PROT & ASPART (70-30) 100 UNIT/ML [place] SUSP Subcutaneous Inject 15-22 Units into the skin See admin instructions. She takes 22 units before breakfast and 15 units before dinner.    [name] LEVOTHYROXINE SODIUM 100 MCG PO TABS Oral Take  1 tablet (100 mcg total) by mouth daily before breakfast. 30 tablet 5  . PRAVASTATIN SODIUM 20 MG PO TABS Oral Take 20 mg by mouth every evening.    [name] TRAMADOL HCL 50 MG PO TABS Oral Take 50 mg by mouth every 6 (six) hours as needed. For pain.    . ALBUTEROL SULFATE HFA 108 (90 BASE) MCG/ACT IN AERS Inhalation Inhale 1-2 puffs into the lungs every 6 (six) hours as needed for wheezing. 1 Inhaler 0    BP 113/76  Pulse 110  Temp(Src) 98.4 F (36.9 C) (Oral)  Resp 20  SpO2 98%  LMP 04/25/2012  Physical Exam  Nursing note and vitals reviewed. Constitutional: She appears well-developed and well-nourished. No distress.    HENT:  Head: Normocephalic and atraumatic.  Right Ear: Tympanic membrane and ear canal normal.  Left Ear: Tympanic membrane and ear canal normal.  Mouth/Throat: Uvula is midline and mucous membranes are normal. Posterior oropharyngeal erythema present. No oropharyngeal exudate or tonsillar abscesses.  Eyes: Pupils are equal, round, and reactive to light.  Neck: Normal range of motion. Neck supple.  Cardiovascular: Normal rate and regular rhythm.   Pulmonary/Chest: Effort normal. No respiratory distress. She has wheezes (mild expiratory wheezing). She has no rales. She exhibits no tenderness.  Abdominal: Soft.  Neurological: She is alert.  Skin: Skin is warm and dry.    ED Course  Procedures (including critical care time)  Labs Reviewed  GLUCOSE, CAPILLARY - Abnormal; Notable for the following:    Glucose-Capillary 437 (*)    All other components within normal limits  CBC - Abnormal; Notable for the following:    HCT 35.6 (*)    All other components within normal limits  BASIC METABOLIC PANEL - Abnormal; Notable for the following:    Sodium 128 (*)    Chloride 88 (*)    Glucose, Bld 485 (*)    GFR calc non Af Amer 80 (*)    All other components within normal limits  URINALYSIS, ROUTINE W REFLEX MICROSCOPIC - Abnormal; Notable for the following:    Glucose, UA >1000 (*)    Hgb urine dipstick TRACE (*)    Ketones, ur 15 (*)    All other components within normal limits  BLOOD GAS, VENOUS - Abnormal; Notable for the following:    Acid-base deficit 5.0 (*)    All other components within normal limits  GLUCOSE, CAPILLARY - Abnormal; Notable for the following:    Glucose-Capillary 571 (*)    All other components within normal limits  GLUCOSE, CAPILLARY - Abnormal; Notable for the following:    Glucose-Capillary 312 (*)    All other components within normal limits  PREGNANCY, URINE  URINE MICROSCOPIC-ADD ON  BLOOD GAS, VENOUS   Dg Chest 2 View  04/25/2012  *RADIOLOGY REPORT*   Clinical Data: Cough, shortness of breath, history diabetes, smoking  CHEST - 2 VIEW  Comparison: 09/12/2011  Findings: Normal heart size, mediastinal contours, and pulmonary vascularity. Lungs clear. No pleural effusion or pneumothorax. Bones unremarkable.  IMPRESSION: No acute abnormalities.  Original Report Authenticated By: Burnetta Sabin, M.D.     1. Hyperglycemia   2. Bronchitis       MDM  Pts cbg noted to 571. Pt is a type one diabetic and not complaining of any sugar related symptoms. Glucostabilizer ordered and patients sugars not being monitored. Dr. Winfred Leeds has examined patient and believes her chief complaint of cough and cold are due to bronchitis. Pt is to  get a Rx for albuterol inhaler after her sugar has come down below 300.  Patient is aware of plan and will be DC after sugars are controlled as she is not showing signs of DKA or instability.   Pt to follow-up with [facility] within the next week for follow-up.  Pt has been advised of the symptoms that warrant their return to the ED. Patient has voiced understanding and has agreed to follow-up with the PCP or specialist.      Linus Mako, [place] 04/25/12 1702

## 2012-04-25 NOTE — ED Notes (Signed)
BS 168. Glucostabilizer advises 1.1 insulin an hour.

## 2012-04-25 NOTE — ED Notes (Signed)
Insulin rate changed to 2.5 per glucostabilizer as a result of glucose of 312.

## 2012-04-25 NOTE — ED Notes (Signed)
States has been wheezing for 2 weeks, with persistent cough, causing pt to vomit. Dry hacky cough present, no wheezes audible,,

## 2012-04-25 NOTE — ED Notes (Signed)
Respiratory called for breathing tx

## 2012-04-27 ENCOUNTER — Encounter: Payer: Self-pay | Admitting: Internal Medicine

## 2012-04-27 NOTE — ED Provider Notes (Signed)
Medical screening examination/treatment/procedure(s) were conducted as a shared visit with non-physician practitioner(s) and myself.  I personally evaluated the patient during the encounter  Orlie Dakin, MD 04/27/12 1626

## 2012-05-02 NOTE — Telephone Encounter (Signed)
Opened in error

## 2012-07-09 NOTE — Addendum Note (Signed)
Addended by: Hulan Fray on: 07/09/2012 07:36 PM   Modules accepted: Orders

## 2012-07-11 ENCOUNTER — Encounter: Payer: Self-pay | Admitting: Internal Medicine

## 2012-07-11 ENCOUNTER — Ambulatory Visit (INDEPENDENT_AMBULATORY_CARE_PROVIDER_SITE_OTHER): Payer: Self-pay | Admitting: Internal Medicine

## 2012-07-11 ENCOUNTER — Ambulatory Visit (HOSPITAL_COMMUNITY)
Admission: RE | Admit: 2012-07-11 | Discharge: 2012-07-11 | Disposition: A | Discharge status: Home / Self Care | Payer: Self-pay | Source: Ambulatory Visit | Attending: Internal Medicine | Admitting: Internal Medicine

## 2012-07-11 VITALS — BP 132/96 | HR 95 | Temp 97.4°F | Ht 64.0 in | Wt 154.6 lb

## 2012-07-11 DIAGNOSIS — Z720 Tobacco use: Secondary | ICD-10-CM

## 2012-07-11 DIAGNOSIS — R220 Localized swelling, mass and lump, head: Secondary | ICD-10-CM | POA: Insufficient documentation

## 2012-07-11 DIAGNOSIS — Z79899 Other long term (current) drug therapy: Secondary | ICD-10-CM

## 2012-07-11 DIAGNOSIS — K219 Gastro-esophageal reflux disease without esophagitis: Secondary | ICD-10-CM

## 2012-07-11 DIAGNOSIS — E049 Nontoxic goiter, unspecified: Secondary | ICD-10-CM

## 2012-07-11 DIAGNOSIS — E890 Postprocedural hypothyroidism: Secondary | ICD-10-CM

## 2012-07-11 DIAGNOSIS — D649 Anemia, unspecified: Secondary | ICD-10-CM | POA: Insufficient documentation

## 2012-07-11 DIAGNOSIS — R221 Localized swelling, mass and lump, neck: Secondary | ICD-10-CM | POA: Insufficient documentation

## 2012-07-11 DIAGNOSIS — E109 Type 1 diabetes mellitus without complications: Secondary | ICD-10-CM

## 2012-07-11 DIAGNOSIS — K0889 Other specified disorders of teeth and supporting structures: Secondary | ICD-10-CM

## 2012-07-11 DIAGNOSIS — E1159 Type 2 diabetes mellitus with other circulatory complications: Secondary | ICD-10-CM | POA: Insufficient documentation

## 2012-07-11 DIAGNOSIS — F17200 Nicotine dependence, unspecified, uncomplicated: Secondary | ICD-10-CM

## 2012-07-11 DIAGNOSIS — E0789 Other specified disorders of thyroid: Secondary | ICD-10-CM

## 2012-07-11 DIAGNOSIS — Z87891 Personal history of nicotine dependence: Secondary | ICD-10-CM | POA: Insufficient documentation

## 2012-07-11 DIAGNOSIS — E039 Hypothyroidism, unspecified: Secondary | ICD-10-CM | POA: Insufficient documentation

## 2012-07-11 DIAGNOSIS — E785 Hyperlipidemia, unspecified: Secondary | ICD-10-CM

## 2012-07-11 DIAGNOSIS — K2970 Gastritis, unspecified, without bleeding: Secondary | ICD-10-CM

## 2012-07-11 DIAGNOSIS — K029 Dental caries, unspecified: Secondary | ICD-10-CM | POA: Insufficient documentation

## 2012-07-11 DIAGNOSIS — K089 Disorder of teeth and supporting structures, unspecified: Secondary | ICD-10-CM

## 2012-07-11 DIAGNOSIS — R739 Hyperglycemia, unspecified: Secondary | ICD-10-CM

## 2012-07-11 DIAGNOSIS — R6884 Jaw pain: Secondary | ICD-10-CM | POA: Insufficient documentation

## 2012-07-11 DIAGNOSIS — R109 Unspecified abdominal pain: Secondary | ICD-10-CM

## 2012-07-11 DIAGNOSIS — R87619 Unspecified abnormal cytological findings in specimens from cervix uteri: Secondary | ICD-10-CM

## 2012-07-11 DIAGNOSIS — Z0000 Encounter for general adult medical examination without abnormal findings: Secondary | ICD-10-CM | POA: Insufficient documentation

## 2012-07-11 DIAGNOSIS — I10 Essential (primary) hypertension: Secondary | ICD-10-CM

## 2012-07-11 LAB — IRON AND TIBC: %SAT: 17 % — ABNORMAL LOW (ref 20–55)

## 2012-07-11 LAB — CBC WITH DIFFERENTIAL/PLATELET
Basophils Absolute: 0 10*3/uL (ref 0.0–0.1)
Basophils Relative: 0 % (ref 0–1)
Eosinophils Absolute: 0.1 10*3/uL (ref 0.0–0.7)
HCT: 33.6 % — ABNORMAL LOW (ref 36.0–46.0)
Hemoglobin: 11.3 g/dL — ABNORMAL LOW (ref 12.0–15.0)
MCH: 27.6 pg (ref 26.0–34.0)
MCHC: 33.3 g/dL (ref 30.0–36.0)
Monocytes Absolute: 0.2 10*3/uL (ref 0.1–1.0)
Monocytes Relative: 5 % (ref 3–12)
Neutrophils Relative %: 64 % (ref 43–77)
RDW: 12.5 % (ref 11.5–15.5)

## 2012-07-11 LAB — LIPID PANEL
Cholesterol: 229 mg/dL — ABNORMAL HIGH (ref 0–200)
HDL: 55 mg/dL (ref >39)
Triglycerides: 120 mg/dL (ref <150)
VLDL: 24 mg/dL (ref 0–40)

## 2012-07-11 LAB — BASIC METABOLIC PANEL WITH GFR
BUN: 13 mg/dL (ref 6–23)
CO2: 26 mEq/L (ref 19–32)
Calcium: 10.1 mg/dL (ref 8.4–10.5)
Chloride: 94 mEq/L — ABNORMAL LOW (ref 96–112)
Creat: 1.04 mg/dL (ref 0.50–1.10)
GFR, Est African American: 85 mL/min
GFR, Est Non African American: 74 mL/min
Glucose, Bld: 734 mg/dL (ref 70–99)
Potassium: 4.7 mEq/L (ref 3.5–5.3)
Potassium: 3.4 mEq/L — ABNORMAL LOW (ref 3.5–5.3)
Sodium: 133 mEq/L — ABNORMAL LOW (ref 135–145)

## 2012-07-11 LAB — GLUCOSE, CAPILLARY

## 2012-07-11 MED ORDER — PRAVASTATIN SODIUM 20 MG PO TABS: 20.0000 mg | ORAL_TABLET | Freq: Every evening | ORAL | Status: DC

## 2012-07-11 MED ORDER — TRAMADOL HCL 50 MG PO TABS: 50.0000 mg | ORAL_TABLET | Freq: Four times a day (QID) | ORAL | Status: DC | PRN

## 2012-07-11 MED ORDER — INSULIN ASPART PROT & ASPART (70-30 MIX) 100 UNIT/ML ~~LOC~~ SUSP: 20.0000 [IU] | Freq: Three times a day (TID) | SUBCUTANEOUS | Status: DC

## 2012-07-11 MED ORDER — INSULIN ASPART 100 UNIT/ML ~~LOC~~ SOLN
15.0000 [IU] | Freq: Once | SUBCUTANEOUS | Status: AC
  Administered 2012-07-11: 15 [IU] via SUBCUTANEOUS

## 2012-07-11 MED ORDER — LEVOTHYROXINE SODIUM 100 MCG PO TABS: 100.0000 ug | ORAL_TABLET | Freq: Every day | ORAL | Status: DC

## 2012-07-11 MED ORDER — IOHEXOL 300 MG/ML  SOLN
80.0000 mL | Freq: Once | INTRAMUSCULAR | Status: AC | PRN
  Administered 2012-07-11: 80 mL via INTRAVENOUS

## 2012-07-11 MED ORDER — INSULIN ASPART 100 UNIT/ML ~~LOC~~ SOLN
10.0000 [IU] | Freq: Once | SUBCUTANEOUS | Status: AC
  Administered 2012-07-11: 10 [IU] via SUBCUTANEOUS

## 2012-07-11 MED ORDER — AMOXICILLIN-POT CLAVULANATE 500-125 MG PO TABS: 1.0000 | ORAL_TABLET | Freq: Two times a day (BID) | ORAL | Status: AC

## 2012-07-11 MED ORDER — FAMOTIDINE 40 MG PO TABS: 40.0000 mg | ORAL_TABLET | Freq: Every evening | ORAL | Status: DC

## 2012-07-11 MED ORDER — LISINOPRIL 5 MG PO TABS: 5.0000 mg | ORAL_TABLET | Freq: Every day | ORAL | Status: DC

## 2012-07-11 MED ORDER — INSULIN REGULAR HUMAN 100 UNIT/ML IJ SOLN: INTRAMUSCULAR | Status: DC

## 2012-07-11 NOTE — Patient Instructions (Addendum)
Diabetes  Please purchase some more test strips for your Re-Lion meter at [facility] Please check your sugar and log your sugar (before and after meals) bring all medications and meters into each visit Check your sugar levels before and after meals (at least three times a day) Take 70/30 20 units three times a day before meals and check your blood sugar before and after meals Use formula=# of units of regular insulin to give yourself after meals=blood glucose-140/40 (after meals) For Blood sugars >400 call the clinic back and make an appointment     Dental pain Start your Augmentin taking twice a day for 7 days  Cholesterol Please start taking your medication and make proper food choices Exercise may help as well   Please keep follow up appointments with the dentist (immediately), OB/GYN (female doctor), and eye doctor  Return to clinic in 1 week

## 2012-07-11 NOTE — Assessment & Plan Note (Signed)
Started pepcid 40 mg daily

## 2012-07-11 NOTE — Assessment & Plan Note (Signed)
Noted upper endoscopy 2009 Pt not on PPI Started Pepcid 40 mg qd

## 2012-07-11 NOTE — Assessment & Plan Note (Signed)
Ordered ferritin, tibc, fe, and transferrin sat

## 2012-07-11 NOTE — Assessment & Plan Note (Signed)
US thyroid showed evidence of multinodular goiter.

## 2012-07-11 NOTE — Assessment & Plan Note (Addendum)
Lipid Panel     Component Value Date/Time   CHOL 229* 07/11/2012 1027   TRIG 120 07/11/2012 1027   HDL 55 07/11/2012 1027   CHOLHDL 4.2 07/11/2012 1027   VLDL 24 07/11/2012 1027   LDLCALC 150* 07/11/2012 1027   Previous ldl not at goal Encouraged pt to take Rx for pravastatin as pt was worried about side effects Rx Pravastatin 40 mg qhs  Pt given info about cholesterol

## 2012-07-11 NOTE — Assessment & Plan Note (Addendum)
Discussed smoking cessation and benefits  Pt given info about smoking cessation

## 2012-07-11 NOTE — Assessment & Plan Note (Addendum)
Pt up to date tdap  Pt had h/o abnormal pap (ascus) has not followed with ob/gyn-referred to OB/GYN for f/u  Pt given info about HPV

## 2012-07-11 NOTE — Assessment & Plan Note (Addendum)
2/2 diet and med noncomplaince Diastolic bp slightly elevated 123/84 Pt is asx'matic except ab pain, denies n/v/d FSBS critically high-pt declines speaking to [employer] today  BMP glucose 734 with AG of 11 (wnl)-Given 15 units Novolog Repeat BMP 612-Given Novolog 10 units  Performed foot exam Ordered BMP, urine Alb/Cr, UA  Plan Following BMP serially until hopefully trends down Referred for outpatient eye exam Advised pt check your sugar levels before and after meals (at least three times a day)-record and bring in meter and meds each visit

## 2012-07-11 NOTE — Assessment & Plan Note (Addendum)
2/2 diet and med noncomplaince with h/o DKA as complication Diastolic bp slightly elevated 123/84 then 132/96-will start Lisinopril 5 mg qd  Pt is asx'matic except ab pain, denies n/v/d FSBS critically high-pt declines speaking to [employer] today  BMP glucose 734 with AG of 11 (wnl)-Given 15 units Novolog Repeat BMP 612-Given Novolog 10 units  After a total of 25 units Novolog pts fsbs decreased to 151 HA1C 14% and trending up  Performed foot exam today  Plan Emphasized need for compliance and checking fsbs before and after meals (at least 3x qd) pt to take 70/30 20 units tid before meals  Pt to take regular insulin # of units=BG-140/40 after meals  Ordered BMP, urine Alb/Cr, UA  Plan Following BMP serially until hopefully trends down Referred for outpatient eye exam Advised pt check your sugar levels before and after meals (at least three times a day)-record and bring in meter and meds each visit Pt given info to read about DM type 1 and diet

## 2012-07-11 NOTE — Assessment & Plan Note (Addendum)
Pt has obvious anterior diffuse enlargement of thyroid with TTP today proven to be goiter by US Obtained tsh and free t4 today-elevated tsh and low free t4 Thyroid levels do not suggest thyroiditis as reported on [place] Pt given information about hypothyroidism Refilled Synthroid 100 mcg daily

## 2012-07-11 NOTE — Assessment & Plan Note (Addendum)
Rx Lisinopril 5 mg qd

## 2012-07-11 NOTE — Progress Notes (Signed)
Subjective:    Patient ID: Angela [name], female    DOB: June 04, 1986, 26 y.o.   MRN: [id number]  HPI Comments: 26 y.o woman PMH DM type 1 (uncontrolled) w/ h/o DKA, hypothyroidism, GERD and gastritis (not on PPI), HLD, dental carries, h/o abnormal pap (ascus), asthma.    Pt presents for f/u for chronic medical conditions   DM: Pt states she was dx'ed age 26. She reports she has been not been checking her fsbs but feels her sugars are elevated b/c of increased thirst, her vision is blurry, her eyes feel like they are "popping" out of her head, increased fatigue, increased urination.  She reports drinking a lot of her favorite drink (apple juice).  She has the [employer] meter but is out of test strips. Pt reports she has not been to the eye doctor in a while.  Pt states she has throbbing ab pain 7/10 today (intermittent ab pain) that radiates to her back.  She denies vag odor, vag d/c, vag bleeding, or dysuria.    Dental pain: pt c/o dental pain in upper and lower back teeth.  Pain is 7/10.  She has a h/o dental abscess and does not follow with dental regularly.  She reports problems with her gums bleeding.  She has trouble eating due to pain.  To eat, softer foods help and chewing the food using her front teeth.    Hypothyroidism: pt states she is taking her thyroid medication.    SH: pt is not working but does not need help with getting her medications.  She smokes 1 ppd cigarettes. Denies alcohol or other drugs. Pt lives with her girlfriend.    Dental Pain  Chronicity: new and recurrent h/o dental abscess. The pain is at a severity of 7/10. Associated symptoms include oral bleeding. Pertinent negatives include no fever. Associated symptoms comments: Difficulty eating foot, chews with front teeth.  Diabetes Pertinent negatives for hypoglycemia include no dizziness. Pertinent negatives for diabetes include no chest pain.      Review of Systems  Constitutional: Negative for fever.  Eyes:  Positive for visual disturbance.       Blurry vision x 2 months  Respiratory: Negative for cough and shortness of breath.        Denies wheezing  Cardiovascular: Negative for chest pain.  Gastrointestinal:       +intermittent ab pain LUQ 7/10 today radiates to back No bowel movement in 3 days Denies n/v/diarrhea  Genitourinary: Negative for dysuria, vaginal bleeding and vaginal discharge.  Neurological: Negative for dizziness, light-headedness and numbness.  Psychiatric/Behavioral: Negative for dysphoric mood.       Sleeping 5 hours at night       Objective:   Physical Exam  Nursing note and vitals reviewed. Constitutional: She is oriented to person, place, and time. Vital signs are normal. She appears well-developed and well-nourished. She is cooperative. No distress.       Elevated BP today  HENT:  Head: Normocephalic and atraumatic.  Mouth/Throat: Oropharynx is clear and moist and mucous membranes are normal. Dental caries present. No dental abscesses. No oropharyngeal exudate.         No obvious abscess  Eyes: Conjunctivae are normal. Pupils are equal, round, and reactive to light. No scleral icterus.  Neck: Thyromegaly present.    Cardiovascular: Normal rate, regular rhythm, S1 normal, S2 normal and normal heart sounds.   No murmur heard. Pulses:      Dorsalis pedis pulses are 2+ on the right side,  and 2+ on the left side.       Posterior tibial pulses are 2+ on the right side, and 2+ on the left side.  Pulmonary/Chest: Effort normal and breath sounds normal. No respiratory distress. She has no wheezes.  Abdominal: Soft. Normal appearance and bowel sounds are normal. There is tenderness in the right upper quadrant and left upper quadrant.  Neurological: She is alert and oriented to person, place, and time. Gait normal.  Skin: Skin is warm and dry. No rash noted. She is not diaphoretic.          Multiple tattoos  Psychiatric: She has a normal mood and affect. Her speech  is normal and behavior is normal.          Assessment & Plan:  F/u 1 week

## 2012-07-11 NOTE — Assessment & Plan Note (Signed)
Fillings and upper and lower back teeth appear rotten w/o obvious evidence of abcess  Pt is afebrile Referred to dental appt ASAP Obtained cbc

## 2012-07-11 NOTE — Assessment & Plan Note (Signed)
Ordered ferritin, fe, ibc, transferring

## 2012-07-11 NOTE — Assessment & Plan Note (Addendum)
Upper and lower most posterior teeth with evidence of caries, tartar, TTP with tongue blade Will refer to dental ASAP  Plan Rx Augmentin 500-125 mg bid x 7 days o RF  CT w/ contrast maxillary face. Pt has h/o abscess and elevated sugars want to r/o abscess as source-will follow up

## 2012-07-12 ENCOUNTER — Telehealth: Payer: Self-pay | Admitting: Internal Medicine

## 2012-07-12 LAB — URINALYSIS, MICROSCOPIC ONLY
Bacteria, UA: NONE SEEN
Crystals: NONE SEEN

## 2012-07-12 LAB — URINALYSIS, ROUTINE W REFLEX MICROSCOPIC
Bilirubin Urine: NEGATIVE
Glucose, UA: >1000 mg/dL — AB
Ketones, ur: NEGATIVE mg/dL
Specific Gravity, Urine: 1.027 (ref 1.005–1.030)

## 2012-07-12 LAB — TRANSFERRIN: Transferrin: 240 mg/dL (ref 200–360)

## 2012-07-12 LAB — MICROALBUMIN / CREATININE URINE RATIO: Microalb Creat Ratio: 21.8 mg/g (ref 0.0–30.0)

## 2012-07-12 MED ORDER — INSULIN NPH ISOPHANE & REGULAR (70-30) 100 UNIT/ML ~~LOC~~ SUSP: 20.0000 [IU] | Freq: Three times a day (TID) | SUBCUTANEOUS | Status: DC

## 2012-07-12 NOTE — Telephone Encounter (Signed)
Reviewed CT scan results with patient. Pt states her teeth are still painful.  Advised she needs a dental appt ASAP  Aundra Dubin

## 2012-07-12 NOTE — Addendum Note (Signed)
Addended by: Cresenciano Genre on: 07/12/2012 01:51 PM   Modules accepted: Orders, Medications

## 2012-07-12 NOTE — Progress Notes (Signed)
Agree with plan. CT max/face to rule out deeper infection, but would treat with augmentin as stated. She needs dentistry appointment for extraction ASAP. Educated pt on insulin adherence. Will need to verify with pharmacy if she is filling her medications. Return to clinic in one week for follow up.

## 2012-07-12 NOTE — Telephone Encounter (Signed)
Spoke with [place] this pt was only getting Novolog 70/30 (Humilin) from pharmacy and lantus.  These medications were last filled 02/2012.  She reinitiated her orange card 06/10/12.  Aundra Dubin

## 2012-07-14 LAB — BASIC METABOLIC PANEL WITH GFR
CO2: 25 mEq/L (ref 19–32)
Chloride: 93 mEq/L — ABNORMAL LOW (ref 96–112)
Glucose, Bld: 612 mg/dL (ref 70–99)
Potassium: 3.9 mEq/L (ref 3.5–5.3)
Sodium: 131 mEq/L — ABNORMAL LOW (ref 135–145)

## 2012-07-18 ENCOUNTER — Ambulatory Visit (INDEPENDENT_AMBULATORY_CARE_PROVIDER_SITE_OTHER): Payer: Self-pay | Admitting: Internal Medicine

## 2012-07-18 ENCOUNTER — Encounter: Payer: Self-pay | Admitting: Internal Medicine

## 2012-07-18 VITALS — BP 121/84 | HR 99 | Temp 97.5°F | Ht 64.0 in | Wt 166.2 lb

## 2012-07-18 DIAGNOSIS — E109 Type 1 diabetes mellitus without complications: Secondary | ICD-10-CM

## 2012-07-18 DIAGNOSIS — K5900 Constipation, unspecified: Secondary | ICD-10-CM

## 2012-07-18 DIAGNOSIS — E039 Hypothyroidism, unspecified: Secondary | ICD-10-CM

## 2012-07-18 DIAGNOSIS — Z0000 Encounter for general adult medical examination without abnormal findings: Secondary | ICD-10-CM

## 2012-07-18 DIAGNOSIS — K029 Dental caries, unspecified: Secondary | ICD-10-CM

## 2012-07-18 DIAGNOSIS — H543 Unqualified visual loss, both eyes: Secondary | ICD-10-CM

## 2012-07-18 DIAGNOSIS — R1013 Epigastric pain: Secondary | ICD-10-CM

## 2012-07-18 DIAGNOSIS — E785 Hyperlipidemia, unspecified: Secondary | ICD-10-CM

## 2012-07-18 DIAGNOSIS — F3289 Other specified depressive episodes: Secondary | ICD-10-CM

## 2012-07-18 DIAGNOSIS — K2990 Gastroduodenitis, unspecified, without bleeding: Secondary | ICD-10-CM

## 2012-07-18 DIAGNOSIS — K219 Gastro-esophageal reflux disease without esophagitis: Secondary | ICD-10-CM

## 2012-07-18 DIAGNOSIS — H547 Unspecified visual loss: Secondary | ICD-10-CM

## 2012-07-18 DIAGNOSIS — E890 Postprocedural hypothyroidism: Secondary | ICD-10-CM

## 2012-07-18 DIAGNOSIS — E049 Nontoxic goiter, unspecified: Secondary | ICD-10-CM

## 2012-07-18 DIAGNOSIS — I10 Essential (primary) hypertension: Secondary | ICD-10-CM

## 2012-07-18 DIAGNOSIS — R109 Unspecified abdominal pain: Secondary | ICD-10-CM

## 2012-07-18 DIAGNOSIS — F329 Major depressive disorder, single episode, unspecified: Secondary | ICD-10-CM

## 2012-07-18 DIAGNOSIS — K2970 Gastritis, unspecified, without bleeding: Secondary | ICD-10-CM

## 2012-07-18 LAB — GLUCOSE, CAPILLARY: Glucose-Capillary: 142 mg/dL — ABNORMAL HIGH (ref 70–99)

## 2012-07-18 MED ORDER — SENNOSIDES-DOCUSATE SODIUM 8.6-50 MG PO TABS: 2.0000 | ORAL_TABLET | Freq: Every day | ORAL | Status: DC | PRN

## 2012-07-18 NOTE — Assessment & Plan Note (Signed)
Rx senna-docusate prn

## 2012-07-18 NOTE — Assessment & Plan Note (Signed)
Pt is taking Pepcid 40 qd

## 2012-07-18 NOTE — Progress Notes (Signed)
Subjective:    Patient ID: Angela [name], female    DOB: May 02, 1986, 26 y.o.   MRN: [id number]  HPI Comments: 26 y.o woman PMH DM 1, HTN, HLD, hypothyroidism with goiter, GERD/gastritis, depression, dental caries, tobacco abuse, anemia, asthma.  She has a h/o noncompliance with meds, diet, and checking fsbs. Pt presents for 1 week f/u for DM 1, hypothyroidism (assoc. With goiter and previous ttp on exam), dental caries, HTN.   Pt states she is feeling quit well except upper ab pain which had immediate onset yesterday (but she has noticed for the last 2-3 days).  Pain is intermittent and pressure like sensation.  She has not had a bowel movement in 1 week.  She reports her appetite is wnl. She denies dysuria, nausea or vomiting.  She reports yesterday she tried sitting on the toilet to have a bowel movement but was not able and noticed ab pain, chills, sweating.  She denies rad of ab pain to back.  She has not had gas.    Pt also reports noticing leg swelling x 3 days.    Pt also reports not being able to read her medication label well    Constipation This is a new problem. The current episode started in the past 7 days. The problem is unchanged. Associated symptoms include abdominal pain. Pertinent negatives include no diarrhea, fever, nausea or vomiting. She has tried nothing for the symptoms. Her past medical history is significant for endocrine disease and psychiatric history.      Review of Systems  Constitutional: Negative for fever, chills, diaphoresis and appetite change.  HENT:       Painful teeth  Eyes: Positive for visual disturbance.  Respiratory: Negative for shortness of breath.   Cardiovascular: Positive for leg swelling. Negative for chest pain.  Gastrointestinal: Positive for abdominal pain and constipation. Negative for nausea, vomiting and diarrhea.       +constipation +ab pain  Genitourinary: Negative for dysuria.       Objective:   Physical Exam  Nursing note  and vitals reviewed. Constitutional: She is oriented to person, place, and time. She appears well-developed and well-nourished. She is cooperative. No distress.  HENT:  Head: Normocephalic and atraumatic.  Mouth/Throat: Oropharynx is clear and moist and mucous membranes are normal. Abnormal dentition. No oropharyngeal exudate.       Multiple dental caries No obvious abscess   Eyes: Conjunctivae are normal. Pupils are equal, round, and reactive to light. No scleral icterus.  Neck:       Thyroid goiter not TTP  Cardiovascular: Regular rhythm, S1 normal, S2 normal and normal heart sounds.  Tachycardia present.   No murmur heard.      +tachycardia  Pulmonary/Chest: Effort normal and breath sounds normal. No respiratory distress. She has no wheezes.  Abdominal: Soft. Bowel sounds are normal. She exhibits no distension. There is tenderness in the right upper quadrant and epigastric area.       Obese ab TTP epigastric RUQ, normal bs No CVA tenderness to palpation b/l   Musculoskeletal:       1+ lower ext edema b/l   Neurological: She is alert and oriented to person, place, and time.  Skin: Skin is warm, dry and intact. No rash noted. She is not diaphoretic.  Psychiatric: She has a normal mood and affect. Her speech is normal and behavior is normal. Judgment and thought content normal. Cognition and memory are normal.          Assessment &  Plan:  Follow up 2 months DM1, hypothyroidism, constipation, ab pain, make sure pt had referral appts

## 2012-07-18 NOTE — Assessment & Plan Note (Signed)
Pt on PPI Pepcid 40 mg qd

## 2012-07-18 NOTE — Assessment & Plan Note (Signed)
Cont Celexa 20 mg qd

## 2012-07-18 NOTE — Assessment & Plan Note (Signed)
Pt has trouble reading medication bottles Not followed with eye doctor in years Referral already placed and pending

## 2012-07-18 NOTE — Assessment & Plan Note (Signed)
Lipid Panel     Component Value Date/Time   CHOL 229* 07/11/2012 1027   TRIG 120 07/11/2012 1027   HDL 55 07/11/2012 1027   CHOLHDL 4.2 07/11/2012 1027   VLDL 24 07/11/2012 1027   LDLCALC 150* 07/11/2012 1027    Pt was previously not taking statin Now pt is taking Pravachol 20 mg qd

## 2012-07-18 NOTE — Assessment & Plan Note (Signed)
Pt has TTP in epigastric and RUQ regions w/o bowel movement in 1 week and reported immediate onset epigastric pain yesterday that has been intermittent assoc with sweating and nausea while trying to have a bowel movement and could not  Pt denies nausea or vomiting  Plan Ordered CMP Rx senna-docusate prn to take for probable constipation

## 2012-07-18 NOTE — Assessment & Plan Note (Addendum)
Pt reports taking her 70/30 qd. She was administering 15-20 units tid  Since 07/12/12 visit pt has been administering 70/30 20 units tid and at max taking 5 units regular insulin Called [facility] where pt gets meds from and 70/30 was last filled 02/2012 Pt has been more compliant with checking her fsbs and taking medication and brings in a log of her medications and meter today for reading Meter readings had low values ranging 29-67 w/ variation in time where am or pm Meter readings ranged as high as 396 Pt reports one time of feeling shaky She eats breakfast and dinner but skips lunch or will eat cookies and drink apple juice at lunch Pt has pending eye exam   A/P Today fsbs 142 improved since last visit  Pt is more compliant with checking fsbs and logging info  She even purchased a book to help her make DM food choices and declines to speak with [facility] Mis not at goal >14% D/t hypoglycemic episodes will changed 70/30 med 20 units tid to 15 units tid Pt to check fsbs before meals and 1 hour after meals She is to only give regular insulin if her fsbs is >180

## 2012-07-18 NOTE — Assessment & Plan Note (Signed)
Pt still has goiter on exam but no TTP She is compliant with taking medication Synthroid 100 mcg qd Repeat tsh, free T4 in 2 months

## 2012-07-18 NOTE — Assessment & Plan Note (Signed)
Pt taking Lisinopril 5 mg qd  BP controlled today 121/84 at goal

## 2012-07-18 NOTE — Patient Instructions (Addendum)
   Please follow up in 2 months  Diabetes Please check fsbs before meals and 1 hour after meals  Decrease your 70/30 to 15 units three times a day Only give yourself regular insulin if your glucose is >180   Make sure you get appts with the dentist, OB/GYN, eye doctor soon  Keep up the good work and taking all of your medications and logging what you are taking and numbers you are getting!   Constipation in Adults Constipation is having fewer than 2 bowel movements per week. Usually, the stools are hard. As we grow older, constipation is more common. If you try to fix constipation with laxatives, the problem may get worse. This is because laxatives taken over a long period of time make the colon muscles weaker. A low-fiber diet, not taking in enough fluids, and taking some medicines may make these problems worse. MEDICATIONS THAT MAY CAUSE CONSTIPATION  Water pills (diuretics).   Calcium channel blockers (used to control blood pressure and for the heart).   Certain pain medicines (narcotics).   Anticholinergics.   Anti-inflammatory agents.   Antacids that contain aluminum.  DISEASES THAT CONTRIBUTE TO CONSTIPATION  Diabetes.   Parkinson's disease.   Dementia.   Stroke.   Depression.   Illnesses that cause problems with salt and water metabolism.  HOME CARE INSTRUCTIONS   Constipation is usually best cared for without medicines. Increasing dietary fiber and eating more fruits and vegetables is the best way to manage constipation.   Slowly increase fiber intake to 25 to 38 grams per day. Whole grains, fruits, vegetables, and legumes are good sources of fiber. A dietitian can further help you incorporate high-fiber foods into your diet.   Drink enough water and fluids to keep your urine clear or pale yellow.   A fiber supplement may be added to your diet if you cannot get enough fiber from foods.   Increasing your activities also helps improve regularity.    Suppositories, as suggested by your caregiver, will also help. If you are using antacids, such as aluminum or calcium containing products, it will be helpful to switch to products containing magnesium if your caregiver says it is okay.   If you have been given a liquid injection (enema) today, this is only a temporary measure. It should not be relied on for treatment of longstanding (chronic) constipation.   Stronger measures, such as magnesium sulfate, should be avoided if possible. This may cause uncontrollable diarrhea. Using magnesium sulfate may not allow you time to make it to the bathroom.  SEEK IMMEDIATE MEDICAL CARE IF:   There is bright red blood in the stool.   The constipation stays for more than 4 days.   There is belly (abdominal) or rectal pain.   You do not seem to be getting better.   You have any questions or concerns.  MAKE SURE YOU:   Understand these instructions.   Will watch your condition.   Will get help right away if you are not doing well or get worse.  Document Released: 09/02/2004 Document Revised: 11/24/2011 Document Reviewed: 11/08/2011 [facility] Patient Information 2012 [place], [place].

## 2012-07-18 NOTE — Assessment & Plan Note (Signed)
Dental referral made and pending Previous imaging did not show abscess Severely decayed teeth that need immediate dental attn Pt taking Ultram for pain with relief (pain this am 8/10 after med 4/10) Pt almost completed Augmentin course

## 2012-07-18 NOTE — Assessment & Plan Note (Signed)
Pt had abnormal (HPV) pap in the past that was never follow up-previously referred to [facility] but pt has not heard RN Regino Schultze called clinic today to remind them to contact pt  HIV test obtained today

## 2012-07-19 ENCOUNTER — Encounter: Payer: Self-pay | Admitting: Internal Medicine

## 2012-07-19 LAB — COMPLETE METABOLIC PANEL WITH GFR
ALT: 9 U/L (ref 0–35)
AST: 19 U/L (ref 0–37)
Albumin: 3.6 g/dL (ref 3.5–5.2)
Alkaline Phosphatase: 73 U/L (ref 39–117)
Calcium: 9.7 mg/dL (ref 8.4–10.5)
Chloride: 104 mEq/L (ref 96–112)
Potassium: 4.3 mEq/L (ref 3.5–5.3)
Sodium: 143 mEq/L (ref 135–145)
Total Protein: 6.3 g/dL (ref 6.0–8.3)

## 2012-07-19 NOTE — Progress Notes (Signed)
INTERNAL MEDICINE TEACHING SERVICE Attending Note  Date: 07/19/2012  Patient name: Angela [name].  Medical record number: [id number]  Date of birth: 12/30/1990    This patient has been seen and discussed with Dr. Aundra Dubin. Please see her note for complete details. I concur with her findings, assessment, and plan.  Dominic Pea

## 2012-07-25 ENCOUNTER — Encounter: Payer: Self-pay | Admitting: Obstetrics & Gynecology

## 2012-08-15 ENCOUNTER — Other Ambulatory Visit: Payer: Self-pay | Admitting: *Deleted

## 2012-08-15 MED ORDER — ALBUTEROL SULFATE HFA 108 (90 BASE) MCG/ACT IN AERS: 1.0000 | INHALATION_SPRAY | Freq: Four times a day (QID) | RESPIRATORY_TRACT | Status: DC | PRN

## 2012-08-17 ENCOUNTER — Telehealth: Payer: Self-pay | Admitting: *Deleted

## 2012-08-17 NOTE — Telephone Encounter (Signed)
rx faxed in 

## 2012-08-17 NOTE — Telephone Encounter (Signed)
Call from [facility] for clarification on med change. Pt was given a Rx for levothyroxine 100 mcg one daily # 30 with 5 refills. Pharmacy wanted to change to synthroid 100 mcg one daily #30 with 5 refills. The change to synthroid was approved by Dr Cathren Laine on 08/15/12 so I told [place] to given #30 with 5 refills as written for levothyroxine.  Is this okay with you?

## 2012-08-22 ENCOUNTER — Encounter: Payer: Self-pay | Admitting: Obstetrics & Gynecology

## 2012-09-10 ENCOUNTER — Ambulatory Visit (INDEPENDENT_AMBULATORY_CARE_PROVIDER_SITE_OTHER): Payer: Self-pay | Admitting: Obstetrics & Gynecology

## 2012-09-10 ENCOUNTER — Encounter: Payer: Self-pay | Admitting: Obstetrics & Gynecology

## 2012-09-10 VITALS — BP 109/68 | HR 103 | Temp 96.8°F | Resp 12 | Ht 64.0 in | Wt 151.8 lb

## 2012-09-10 DIAGNOSIS — R87619 Unspecified abnormal cytological findings in specimens from cervix uteri: Secondary | ICD-10-CM

## 2012-09-10 DIAGNOSIS — B373 Candidiasis of vulva and vagina: Secondary | ICD-10-CM

## 2012-09-10 MED ORDER — FLUCONAZOLE 150 MG PO TABS: 150.0000 mg | ORAL_TABLET | Freq: Once | ORAL | Status: DC

## 2012-09-10 NOTE — Progress Notes (Signed)
Subjective:     Patient ID: Angela [name], female   DOB: October 16, 1986, 26 y.o.   MRN: [id number]  HPI  Pt with h/o abnormal PAP.  Had colpo but, did not f/u after bx.  Referred for eval of abnormal PAP  Last record of PAP was 06/2009      Review of Systems     Objective:   Physical ExamBP 109/68  Pulse 103  Temp 96.8 F (36 C) (Oral)  Resp 12  Ht 5\' 4"  (1.626 m)  Wt 151 lb 12.8 oz (68.856 kg)  BMI 26.06 kg/m2  LMP 08/10/2012  GU: EGBUS: erythematous; satellite lesions Vagina: no blood in vault Cervix: no lesion; no mucopurulent d/c- pap obtained  Bx 06/2009  1. UTERINE CERVIX, 6 O' CLOCK, BIOPSY: BENIGN ECTOCERVICAL AND ENDOCERVICAL MUCOSA WITH CHRONIC FOLLICULAR CERVICITIS.  2. ENDOCERVICAL CURETTINGS: - SCANTY BENIGN ENDOCERVICAL MUCOSA. - ACUTE INFLAMMATION         Assessment:     Abnormal PAP/bx with no f/u- no dyspasia seen on PAP Yeast vaginitis     Plan:     f/u PAP and cx Diflucan 150mg  po q day  Angela [name], M.D., Cherlynn June

## 2012-09-10 NOTE — Addendum Note (Signed)
Addended by: Lavonia Drafts on: 09/10/2012 02:57 PM   Modules accepted: Orders

## 2012-09-10 NOTE — Patient Instructions (Addendum)
Abnormal Pap Test WHAT DOES A PAP TEST CHECK FOR? A Pap test checks the cells in the cervix for any infections or changes that may turn into cancer. The cells are checked to see if they look normal or if they show any changes (abnormal). Abnormal changes may be a sign of problems with your cervix. Cervical cells that are abnormal are called cervical dysplasia. Dysplasia is not cancer. It is a pre-cancerous change in the cells of your cervix. WHAT DOES AN ABNORMAL PAP TEST MEAN? Most times, an abnormal Pap test does not mean you have cancer. However, it does show that there may be a problem. Your doctor will want to do other tests to find out more about the abnormal cells.  Your abnormal Pap test results could show:  Small changes that should be carefully watched.   Dysplasia that could grow into cancer.   Cancer.  When dysplasia is found and treated early, it most often does not grow into cancer. WHAT WILL BE DONE ABOUT MY ABNORMAL PAP TEST? You may have:  A colposcopy test done. Your cevix will be looked at using a strong light and a microscope.   A cone biopsy. A small, cone-shaped sample of your cervix is taken out. The part that is taken out is the area where the abnormal cells are.   Cryosurgery. The abnormal cells on your cervix will be frozen.   Loop Electrical Excision Procedure (LEEP). The abnormal cells will be taken out.  WHAT IF I HAVE A DYSPLASIA OR A CANCER? You and your doctor may choose a hysterectomy as the best treatment for you. During this surgery, your womb (uterus) and your cervix will be taken out. WHAT SHOULD YOU DO AFTER BEING TREATED? Keep having Pap tests and checkups as often as your doctor tells you. Your cervical problem will be carefully watched so it does not get worse. Also, your doctor can watch for, and treat, any new problems that may come up. Document Released: 03/22/2011 Document Revised: 11/24/2011 Document Reviewed: 11/06/2011 [facility] Patient  Information 2012 [place].HPV Test The HPV (Human papillomavirus) test isused to screen for high-risk types with HPV infection. HPV is a group of about 100 related viruses, of which 40 types are genital viruses. Most HPV viruses cause infections that usually resolve without treatment within 2 years. Some HPV infections can cause skin and genital warts (condylomata). HPV types 16, 18, 31 and 45 are considered high-risk types of HPV. High-risk types of HPV do not usually cause visible warts, but if untreated, may lead to cancers of the outlet of the womb (cervix) or anus. An HPV test identifies the DNA (genetic) strands of the HPV infection. Because the test identifies the DNA strands, the test is also referred to as the HPV DNA test. Although HPV is found in both males and females, the HPV test is only used to screen for cervical cancer in females. This test is recommended for females:  With an abnormal Pap test.   After treatment of an abnormal Pap test.   Aged 9 and older.   After treatment of a high-risk HPV infection.  The HPV test may be done at the same time as a Pap test in females over the age of 69. Both the HPV and Pap test require a sample of cells from the cervix. PREPARATION FOR TEST  You may be asked to avoid douching, tampons or birth canal (vaginal) medicines for 48 hours before the HPV test. You will be asked  to urinate before the test. For the HPV test, you will need to lie on an exam table with your feet in stirrups. A spatula will be inserted into the vagina. The spatula will be used to swab the cervix for a cell and mucus sample. The sample will be further evaluated in a lab under a microscope. NORMAL FINDINGS  Normal: High-risk HPV is not found.  Ranges for normal findings may vary among different laboratories and hospitals. You should always check with your doctor after having lab work or other tests done to discuss the meaning of your test results and whether your  values are considered within normal limits. MEANING OF TEST An abnormal HPV test means that high-risk HPV is found. Your caregiver may recommend further testing. Your caregiver will go over the test results with you and discuss the importance and meaning of your results, as well as treatment options and the need for additional tests, if necessary. OBTAINING THE RESULTS  It is your responsibility to obtain your test results. Ask the lab or department performing the test when and how you will get your results. Document Released: 12/30/2004 Document Revised: 11/24/2011 Document Reviewed: 09/14/2005 [facility] Patient Information 2012 [place].

## 2012-09-13 ENCOUNTER — Telehealth: Payer: Self-pay | Admitting: *Deleted

## 2012-09-13 NOTE — Telephone Encounter (Signed)
Called patient as requested and notified needs to f/u in l year for a repeat pap. Advised to call 3 months ahead to schedule appointment. Patient voices understanding.

## 2012-09-13 NOTE — Telephone Encounter (Signed)
Message copied by Samuel Germany on Thu Sep 13, 2012  4:32 PM ------      Message from: Lavonia Drafts      Created: Wed Sep 12, 2012  4:45 PM       Please notify pt to f/u in 1 year for a repeat PAP.            Thx,      clh-S

## 2012-10-12 ENCOUNTER — Other Ambulatory Visit: Payer: Self-pay | Admitting: *Deleted

## 2012-10-12 MED ORDER — ALBUTEROL SULFATE HFA 108 (90 BASE) MCG/ACT IN AERS: 1.0000 | INHALATION_SPRAY | Freq: Four times a day (QID) | RESPIRATORY_TRACT | Status: DC | PRN

## 2012-10-12 NOTE — Telephone Encounter (Signed)
t has scheduled appointment 11/14 with PCP

## 2012-10-12 NOTE — Telephone Encounter (Signed)
Proventil refil - request faxed to [place].

## 2012-11-01 ENCOUNTER — Encounter: Payer: Self-pay | Admitting: Internal Medicine

## 2012-11-01 ENCOUNTER — Ambulatory Visit (INDEPENDENT_AMBULATORY_CARE_PROVIDER_SITE_OTHER): Payer: Self-pay | Admitting: Internal Medicine

## 2012-11-01 VITALS — BP 133/95 | HR 96 | Temp 98.1°F | Ht 64.0 in | Wt 157.0 lb

## 2012-11-01 DIAGNOSIS — K219 Gastro-esophageal reflux disease without esophagitis: Secondary | ICD-10-CM

## 2012-11-01 DIAGNOSIS — E890 Postprocedural hypothyroidism: Secondary | ICD-10-CM

## 2012-11-01 DIAGNOSIS — B373 Candidiasis of vulva and vagina: Secondary | ICD-10-CM

## 2012-11-01 DIAGNOSIS — Z23 Encounter for immunization: Secondary | ICD-10-CM

## 2012-11-01 DIAGNOSIS — D649 Anemia, unspecified: Secondary | ICD-10-CM

## 2012-11-01 DIAGNOSIS — Z720 Tobacco use: Secondary | ICD-10-CM

## 2012-11-01 DIAGNOSIS — F17200 Nicotine dependence, unspecified, uncomplicated: Secondary | ICD-10-CM

## 2012-11-01 DIAGNOSIS — E109 Type 1 diabetes mellitus without complications: Secondary | ICD-10-CM

## 2012-11-01 DIAGNOSIS — Z79899 Other long term (current) drug therapy: Secondary | ICD-10-CM

## 2012-11-01 MED ORDER — FLUCONAZOLE 150 MG PO TABS: 150.0000 mg | ORAL_TABLET | Freq: Every day | ORAL | Status: DC

## 2012-11-01 MED ORDER — INSULIN GLARGINE 100 UNIT/ML ~~LOC~~ SOLN: 35.0000 [IU] | Freq: Every day | SUBCUTANEOUS | Status: DC

## 2012-11-01 MED ORDER — FERROUS SULFATE 325 (65 FE) MG PO TABS: 325.0000 mg | ORAL_TABLET | Freq: Every day | ORAL | Status: DC

## 2012-11-01 MED ORDER — INSULIN ASPART 100 UNIT/ML ~~LOC~~ SOLN: 5.0000 [IU] | Freq: Three times a day (TID) | SUBCUTANEOUS | Status: DC

## 2012-11-01 MED ORDER — ESOMEPRAZOLE MAGNESIUM 20 MG PO CPDR: 20.0000 mg | DELAYED_RELEASE_CAPSULE | Freq: Every day | ORAL | Status: DC

## 2012-11-01 NOTE — Progress Notes (Signed)
Internal Medicine Clinic Visit    HPI:  Angela [name] is a 26 y.o. year old female with a history of poorly controlled Type 1 DM, HLD, Hypothyroidism, GERD, who presents for follow up of multiple medical issues.  DM: she states she has been taking her 70/30 15u TID with meals, though she has not been checking her blood sugars at home due to a broken meter over the past few weeks. Before then, she was checking her blood sugars regularly. She denies symptoms of hypoglycemia including shakiness, sweating, etc.  Hypothyroidism: She misses doses about 1-3 times per week.  She also complains of white vaginal discharge with associated irritation and pruritis. She states this is similar to her previous episodes of yeast infections  Smoking cessation: she has cut down a bit and is interested in quitting smoking.     Past Medical History  Diagnosis Date  . HLD (hyperlipidemia)   . Depression, major     was on multiple medication before followed by psych but was lost to follow up 2-3 years ago when she go arrested, stopped multiple medications that she was on (zoloft, abilify, depakote) , never restarted it  . Anemia     baseline Hb 10-11, ferriting 53  . 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 Hypothyroidism     untreated, non compliance  . Asthma   . Hypothyroidism   . DM type 1 (diabetes mellitus, type 1)     uncontrolled due to medication non compliance, DKA admission at [facility] in 2008, Dx age 25   . Abnormal Pap smear of cervix     ascus noted 2007  . Gastritis   . Hypertension     Past Surgical History  Procedure Date  . Other surgical history     right foot  . Foot fusion 2006     ROS:  A complete review of systems was otherwise negative, except as noted in the HPI.  Allergies: Ciprofloxacin; Sulfamethoxazole; and Trimethoprim  Medications: Current Outpatient Prescriptions  Medication Sig Dispense Refill  . albuterol (PROVENTIL HFA;VENTOLIN HFA) 108 (90 BASE) MCG/ACT  inhaler Inhale 1-2 puffs into the lungs every 6 (six) hours as needed for wheezing.  1 Inhaler  1  . famotidine (PEPCID) 40 MG tablet Take 1 tablet (40 mg total) by mouth every evening.  30 tablet  11  . insulin NPH-insulin regular (NOVOLIN 70/30) (70-30) 100 UNIT/ML injection Inject 20 Units into the skin 3 (three) times daily before meals.  10 mL  12  . insulin regular (NOVOLIN R,HUMULIN R) 100 units/mL injection # of units to give yourself=blood sugar value - 140/40  10 mL  11  . levothyroxine (SYNTHROID, LEVOTHROID) 100 MCG tablet Take 1 tablet (100 mcg total) by mouth daily before breakfast.  30 tablet  5  . lisinopril (PRINIVIL,ZESTRIL) 5 MG tablet Take 1 tablet (5 mg total) by mouth daily.  30 tablet  11  . pravastatin (PRAVACHOL) 20 MG tablet Take 1 tablet (20 mg total) by mouth every evening.  30 tablet  11  . citalopram (CELEXA) 20 MG tablet Take 1 tablet (20 mg total) by mouth daily.  30 tablet  5  . ferrous sulfate 325 (65 FE) MG tablet Take 1 tablet (325 mg total) by mouth daily.  30 tablet  3  . fluconazole (DIFLUCAN) 150 MG tablet Take 1 tablet (150 mg total) by mouth daily. For 2 days.  10 tablet  0  . insulin aspart (NOVOLOG FLEXPEN) 100  UNIT/ML injection Inject 5 Units into the skin 3 (three) times daily before meals.  30 mL  3  . insulin glargine (LANTUS) 100 UNIT/ML injection Inject 35 Units into the skin at bedtime.  15 pen  3  . senna-docusate (SENOKOT-S) 8.6-50 MG per tablet Take 2 tablets by mouth daily as needed for constipation.  30 tablet  1  . traMADol (ULTRAM) 50 MG tablet Take 1 tablet (50 mg total) by mouth every 6 (six) hours as needed for pain. For pain.  120 tablet  2  . [DISCONTINUED] insulin aspart (NOVOLOG FLEXPEN) 100 UNIT/ML injection Inject 5 Units into the skin 3 (three) times daily before meals.  15 mL  11    History   Social History  . Marital Status: Divorced    Spouse Name: N/A    Number of Children: 0  . Years of Education: 10th grade    Occupational History  . unemployed     worked at a group   Social History Main Topics  . Smoking status: Current Every Day Smoker -- 1.0 packs/day for 2 years    Types: Cigarettes  . Smokeless tobacco: Never Used     Comment: Will let us know when she is ready.  . Alcohol Use: Yes  . Drug Use: No     Comment: remote marijuana use, quit in 2011  . Sexually Active: Yes    Birth Control/ Protection: None   Other Topics Concern  . 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 Lives with her mother who is an Glass blower/designer in [employer]. Smoking Status:  Current, Packs/Day:  2-3Sexual History:  multiple partners in the past, same sex encounters,current partner is a CNA and she is planning to move in with herDrug Use:  Past,, marijuana    family history includes Breast cancer in her paternal grandmother; Diabetes type I in her maternal grandfather; Heart disease in her maternal grandmother; Hyperlipidemia in her maternal grandmother; Hypertension in her maternal grandfather and maternal grandmother; Hypothyroidism in her mother; Multiple sclerosis in her mother; Prostate cancer in her maternal grandfather; and Stroke in her mother.  Physical Exam Blood pressure 133/95, pulse 96, temperature 98.1 F (36.7 C), temperature source Oral, height 5\' 4"  (1.626 m), weight 157 lb (71.215 kg), SpO2 98.00%. General:  No acute distress, alert and oriented x 3, well-appearing  HEENT:  PERRL, EOMI, no lymphadenopathy, moist mucous membranes Cardiovascular:  Regular rate and rhythm, no murmurs, rubs or gallops Respiratory:  Clear to auscultation bilaterally, no wheezes, rales, or rhonchi Abdomen:  Soft, nondistended, nontender, normoactive bowel sounds Extremities:  Warm and well-perfused, no clubbing, cyanosis,  or edema.  Skin: Warm, dry, no rashes Neuro: Not anxious appearing, no depressed mood, normal affect  Labs: Lab Results  Component Value Date   CREATININE 0.95 07/18/2012   BUN 8 07/18/2012   NA 143 07/18/2012   K 4.3 07/18/2012   CL 104 07/18/2012   CO2 31 07/18/2012   Lab Results  Component Value Date   WBC 4.9 07/11/2012   HGB 11.3* 07/11/2012   HCT 33.6* 07/11/2012   MCV 82.0 07/11/2012   PLT 173 07/11/2012      Assessment and Plan:  Please see problem-based charting for full assessment and plan.   FOLLOWUP: Angela [name] will follow back  up in our clinic in approximately  1 week. Angela [name] knows to call out clinic in the meantime with any questions or new issues.   Patient was seen and evaluated by Santa Lighter, MD,  and Larey Dresser, MD Attending Physician.

## 2012-11-02 LAB — LIPID PANEL
Cholesterol: 221 mg/dL — ABNORMAL HIGH (ref 0–200)
HDL: 78 mg/dL (ref >39)
LDL Cholesterol: 118 mg/dL — ABNORMAL HIGH (ref 0–99)
Triglycerides: 127 mg/dL (ref <150)
VLDL: 25 mg/dL (ref 0–40)

## 2012-11-04 NOTE — Assessment & Plan Note (Signed)
Diabetes is not well-controlled, A1c today is 13. Patient has not been checking her blood sugars at home because her meter broke. She did not bring her meter or a log book with blood sugars to today's visit. She states she has been taking 70/30 15-20u TID, which she gets from the health dept. She denies any hypoglycemic episodes. Now that she is on the MAP program to help her get medications, she would like to transition to lantus.  -will start lantus 35u QHS with novolog flexpen 5u TID with meals (calculations made from her daily insulin requirements and taking into consideration her elevated A1c) -will need close monitoring during this regimen change and she knows to call our clinic with questions -phone call with diabetes educator next week to f/u with regimen -f/u in clinic in 2 weeks -rx given for new meter and strips -discussed keeping a log of fasting sugars and bringing it in along with her meter at next visit -reviewed signs and symptoms of hypoglycemia and she knows to administer glucose and call clinic with problems

## 2012-11-04 NOTE — Assessment & Plan Note (Signed)
TSH 12.6, free T4 is 0.85 - checked during this visit Patient reports missing 1-3 doses of synthroid per week  -encouraged daily adherence to medication regimen -plan for keeping a small bottle in backpack for when she sleeps away from her house so she doesn't miss as many doses -may need to increase dose at next visit

## 2012-11-04 NOTE — Assessment & Plan Note (Signed)
Unclear etiology. Patient with mild fatigue symptoms. Suspect patient is mildly iron deficient. In July, 11.3% iron sat, iron level 49 (lower limit of normal), ferritin normal.  -will try iron supplementation

## 2012-11-04 NOTE — Assessment & Plan Note (Signed)
Patient more open to discussing smoking cessation. Discussed briefly, she is open to different cessation methods. Will follow up with subsequent visits.

## 2012-11-04 NOTE — Assessment & Plan Note (Signed)
Patient with symptoms of vaginal yeast infection. SHe has had episodes like this in the past. HIV checked recently and was negative. May need further STI screening, will discuss in later visit as we dealt mainly with DM management during today's visit.  -PO diflucan rx given -pt to call if no improvement in several days

## 2012-11-05 ENCOUNTER — Telehealth: Payer: Self-pay | Admitting: *Deleted

## 2012-11-06 NOTE — Telephone Encounter (Signed)
Review meds 

## 2012-11-08 ENCOUNTER — Telehealth: Payer: Self-pay | Admitting: Internal Medicine

## 2012-11-08 NOTE — Telephone Encounter (Signed)
Called patient to follow up on insulin changes made last week in clinic. Called twice to the available number listed in the medical record. No response. Left a message asking her to call the clinic and let us know how she is doing and schedule an appointment if needed.

## 2012-12-07 ENCOUNTER — Other Ambulatory Visit: Payer: Self-pay | Admitting: *Deleted

## 2012-12-17 ENCOUNTER — Other Ambulatory Visit: Payer: Self-pay | Admitting: Internal Medicine

## 2012-12-17 MED ORDER — ALBUTEROL SULFATE HFA 108 (90 BASE) MCG/ACT IN AERS: 1.0000 | INHALATION_SPRAY | Freq: Four times a day (QID) | RESPIRATORY_TRACT | Status: DC | PRN

## 2012-12-17 NOTE — Telephone Encounter (Signed)
Rx called in to pharmacy. 

## 2012-12-27 ENCOUNTER — Encounter: Payer: Self-pay | Admitting: Internal Medicine

## 2013-01-07 ENCOUNTER — Emergency Department (HOSPITAL_COMMUNITY): Payer: No Typology Code available for payment source

## 2013-01-07 ENCOUNTER — Inpatient Hospital Stay (HOSPITAL_COMMUNITY)
Admission: EM | Admit: 2013-01-07 | Discharge: 2013-01-08 | DRG: 638 | Disposition: A | Discharge status: Home / Self Care | Payer: No Typology Code available for payment source | Attending: Internal Medicine | Admitting: Internal Medicine

## 2013-01-07 ENCOUNTER — Encounter (HOSPITAL_COMMUNITY): Payer: Self-pay | Admitting: Family Medicine

## 2013-01-07 DIAGNOSIS — A088 Other specified intestinal infections: Secondary | ICD-10-CM

## 2013-01-07 DIAGNOSIS — Z720 Tobacco use: Secondary | ICD-10-CM

## 2013-01-07 DIAGNOSIS — H109 Unspecified conjunctivitis: Secondary | ICD-10-CM

## 2013-01-07 DIAGNOSIS — E119 Type 2 diabetes mellitus without complications: Secondary | ICD-10-CM

## 2013-01-07 DIAGNOSIS — E785 Hyperlipidemia, unspecified: Secondary | ICD-10-CM | POA: Diagnosis present

## 2013-01-07 DIAGNOSIS — Z888 Allergy status to other drugs, medicaments and biological substances status: Secondary | ICD-10-CM

## 2013-01-07 DIAGNOSIS — E1069 Type 1 diabetes mellitus with other specified complication: Principal | ICD-10-CM | POA: Diagnosis present

## 2013-01-07 DIAGNOSIS — G4700 Insomnia, unspecified: Secondary | ICD-10-CM | POA: Diagnosis present

## 2013-01-07 DIAGNOSIS — B309 Viral conjunctivitis, unspecified: Secondary | ICD-10-CM | POA: Diagnosis present

## 2013-01-07 DIAGNOSIS — I10 Essential (primary) hypertension: Secondary | ICD-10-CM | POA: Diagnosis present

## 2013-01-07 DIAGNOSIS — E049 Nontoxic goiter, unspecified: Secondary | ICD-10-CM

## 2013-01-07 DIAGNOSIS — R109 Unspecified abdominal pain: Secondary | ICD-10-CM

## 2013-01-07 DIAGNOSIS — F329 Major depressive disorder, single episode, unspecified: Secondary | ICD-10-CM | POA: Diagnosis present

## 2013-01-07 DIAGNOSIS — Z882 Allergy status to sulfonamides status: Secondary | ICD-10-CM

## 2013-01-07 DIAGNOSIS — Z9119 Patient's noncompliance with other medical treatment and regimen: Secondary | ICD-10-CM

## 2013-01-07 DIAGNOSIS — E870 Hyperosmolality and hypernatremia: Principal | ICD-10-CM | POA: Diagnosis present

## 2013-01-07 DIAGNOSIS — E039 Hypothyroidism, unspecified: Secondary | ICD-10-CM | POA: Diagnosis present

## 2013-01-07 DIAGNOSIS — J45909 Unspecified asthma, uncomplicated: Secondary | ICD-10-CM | POA: Diagnosis present

## 2013-01-07 DIAGNOSIS — Z794 Long term (current) use of insulin: Secondary | ICD-10-CM

## 2013-01-07 DIAGNOSIS — Z79899 Other long term (current) drug therapy: Secondary | ICD-10-CM

## 2013-01-07 DIAGNOSIS — K2970 Gastritis, unspecified, without bleeding: Secondary | ICD-10-CM

## 2013-01-07 DIAGNOSIS — K219 Gastro-esophageal reflux disease without esophagitis: Secondary | ICD-10-CM

## 2013-01-07 DIAGNOSIS — Z881 Allergy status to other antibiotic agents status: Secondary | ICD-10-CM

## 2013-01-07 DIAGNOSIS — E890 Postprocedural hypothyroidism: Secondary | ICD-10-CM

## 2013-01-07 DIAGNOSIS — F17200 Nicotine dependence, unspecified, uncomplicated: Secondary | ICD-10-CM | POA: Diagnosis present

## 2013-01-07 DIAGNOSIS — E109 Type 1 diabetes mellitus without complications: Secondary | ICD-10-CM

## 2013-01-07 DIAGNOSIS — Z91199 Patient's noncompliance with other medical treatment and regimen due to unspecified reason: Secondary | ICD-10-CM

## 2013-01-07 DIAGNOSIS — R739 Hyperglycemia, unspecified: Secondary | ICD-10-CM

## 2013-01-07 LAB — COMPREHENSIVE METABOLIC PANEL
Albumin: 3.4 g/dL — ABNORMAL LOW (ref 3.5–5.2)
BUN: 9 mg/dL (ref 6–23)
Calcium: 9.2 mg/dL (ref 8.4–10.5)
Creatinine, Ser: 1.04 mg/dL (ref 0.50–1.10)
GFR calc Af Amer: 85 mL/min — ABNORMAL LOW (ref >90)
Total Protein: 7 g/dL (ref 6.0–8.3)

## 2013-01-07 LAB — URINE MICROSCOPIC-ADD ON

## 2013-01-07 LAB — CBC WITH DIFFERENTIAL/PLATELET
Basophils Relative: 0 % (ref 0–1)
Eosinophils Absolute: 0.1 10*3/uL (ref 0.0–0.7)
Eosinophils Relative: 1 % (ref 0–5)
Hemoglobin: 10.2 g/dL — ABNORMAL LOW (ref 12.0–15.0)
MCH: 27.6 pg (ref 26.0–34.0)
MCHC: 32.3 g/dL (ref 30.0–36.0)
MCV: 85.6 fL (ref 78.0–100.0)
Monocytes Absolute: 0.2 10*3/uL (ref 0.1–1.0)
Monocytes Relative: 5 % (ref 3–12)
Neutrophils Relative %: 64 % (ref 43–77)

## 2013-01-07 LAB — BASIC METABOLIC PANEL
BUN: 9 mg/dL (ref 6–23)
Calcium: 8.7 mg/dL (ref 8.4–10.5)
Chloride: 99 mEq/L (ref 96–112)
GFR calc Af Amer: >90 mL/min (ref >90)
GFR calc Af Amer: >90 mL/min (ref >90)
GFR calc non Af Amer: 79 mL/min — ABNORMAL LOW (ref >90)
GFR calc non Af Amer: 85 mL/min — ABNORMAL LOW (ref >90)
Glucose, Bld: 600 mg/dL (ref 70–99)
Potassium: 4.3 mEq/L (ref 3.5–5.1)
Potassium: 4 mEq/L (ref 3.5–5.1)
Sodium: 128 mEq/L — ABNORMAL LOW (ref 135–145)
Sodium: 133 mEq/L — ABNORMAL LOW (ref 135–145)

## 2013-01-07 LAB — GLUCOSE, CAPILLARY
Glucose-Capillary: >600 mg/dL (ref 70–99)
Glucose-Capillary: 585 mg/dL (ref 70–99)

## 2013-01-07 LAB — URINALYSIS, ROUTINE W REFLEX MICROSCOPIC
Bilirubin Urine: NEGATIVE
Ketones, ur: NEGATIVE mg/dL
Nitrite: NEGATIVE
pH: 7 (ref 5.0–8.0)

## 2013-01-07 LAB — POCT PREGNANCY, URINE: Preg Test, Ur: NEGATIVE

## 2013-01-07 LAB — MRSA PCR SCREENING: MRSA by PCR: NEGATIVE

## 2013-01-07 LAB — TROPONIN I: Troponin I: <0.3 ng/mL (ref <0.30)

## 2013-01-07 MED ORDER — LEVOTHYROXINE SODIUM 100 MCG PO TABS
100.0000 ug | ORAL_TABLET | Freq: Every day | ORAL | Status: DC
  Administered 2013-01-08: 100 ug via ORAL
  Filled 2013-01-07 (×2): qty 1

## 2013-01-07 MED ORDER — HEPARIN SODIUM (PORCINE) 5000 UNIT/ML IJ SOLN
5000.0000 [IU] | Freq: Three times a day (TID) | INTRAMUSCULAR | Status: DC
  Administered 2013-01-07 – 2013-01-08 (×2): 5000 [IU] via SUBCUTANEOUS
  Filled 2013-01-07 (×5): qty 1

## 2013-01-07 MED ORDER — ALBUTEROL SULFATE HFA 108 (90 BASE) MCG/ACT IN AERS: 1.0000 | INHALATION_SPRAY | Freq: Four times a day (QID) | RESPIRATORY_TRACT | Status: DC | PRN

## 2013-01-07 MED ORDER — SODIUM CHLORIDE 0.9 % IV SOLN
INTRAVENOUS | Status: DC
  Administered 2013-01-07: 22:00:00 via INTRAVENOUS

## 2013-01-07 MED ORDER — SODIUM CHLORIDE 0.9 % IV SOLN
INTRAVENOUS | Status: DC
  Administered 2013-01-07: 3.1 [IU]/h via INTRAVENOUS
  Administered 2013-01-07: 2.9 [IU]/h via INTRAVENOUS
  Administered 2013-01-07 – 2013-01-08 (×2): 1.8 [IU]/h via INTRAVENOUS
  Administered 2013-01-08: 0.8 [IU]/h via INTRAVENOUS
  Administered 2013-01-08: 1.6 [IU]/h via INTRAVENOUS
  Administered 2013-01-08 (×2): 0.8 [IU]/h via INTRAVENOUS
  Filled 2013-01-07: qty 1

## 2013-01-07 MED ORDER — SODIUM CHLORIDE 0.9 % IV SOLN
1000.0000 mL | Freq: Once | INTRAVENOUS | Status: AC
  Administered 2013-01-07: 1000 mL via INTRAVENOUS

## 2013-01-07 MED ORDER — ONDANSETRON HCL 4 MG PO TABS: 4.0000 mg | ORAL_TABLET | Freq: Four times a day (QID) | ORAL | Status: DC | PRN

## 2013-01-07 MED ORDER — SODIUM CHLORIDE 0.9 % IV SOLN
INTRAVENOUS | Status: AC
  Administered 2013-01-07: 20:00:00 via INTRAVENOUS

## 2013-01-07 MED ORDER — ACETAMINOPHEN 325 MG PO TABS
650.0000 mg | ORAL_TABLET | Freq: Four times a day (QID) | ORAL | Status: DC | PRN
  Administered 2013-01-08 (×2): 650 mg via ORAL
  Filled 2013-01-07: qty 2
  Filled 2013-01-07: qty 1

## 2013-01-07 MED ORDER — SODIUM CHLORIDE 0.9 % IV BOLUS (SEPSIS)
2000.0000 mL | Freq: Once | INTRAVENOUS | Status: DC
Start: 2013-01-07 — End: 2013-01-07

## 2013-01-07 MED ORDER — PANTOPRAZOLE SODIUM 40 MG PO TBEC
40.0000 mg | DELAYED_RELEASE_TABLET | Freq: Every day | ORAL | Status: DC
  Administered 2013-01-08: 40 mg via ORAL
  Filled 2013-01-07: qty 1

## 2013-01-07 MED ORDER — SODIUM CHLORIDE 0.9 % IV SOLN
INTRAVENOUS | Status: DC
  Administered 2013-01-07: 5.3 [IU]/h via INTRAVENOUS
  Filled 2013-01-07: qty 1

## 2013-01-07 MED ORDER — DEXTROSE-NACL 5-0.45 % IV SOLN
INTRAVENOUS | Status: DC
  Administered 2013-01-08: 1000 mL via INTRAVENOUS

## 2013-01-07 MED ORDER — DEXTROSE 50 % IV SOLN: 25.0000 mL | INTRAVENOUS | Status: DC | PRN

## 2013-01-07 MED ORDER — ONDANSETRON HCL 4 MG/2ML IJ SOLN: 4.0000 mg | Freq: Four times a day (QID) | INTRAMUSCULAR | Status: DC | PRN

## 2013-01-07 MED ORDER — SODIUM CHLORIDE 0.9 % IV SOLN: 1000.0000 mL | INTRAVENOUS | Status: DC

## 2013-01-07 NOTE — ED Notes (Signed)
CRITICAL VALUE ALERT  Critical value received:  Glucose 600  Date of notification:  01/07/2013  Time of notification:  2042  Critical value read back:yes  Nurse who received alert:  MG Braulio Conte, RN  MD notified (1st page):  Dr. Maryan Rued  Time of first page:  2042  MD notified (2nd page):  Time of second page:  Responding MD: Dr. Maryan Rued  Time MD responded:  2042

## 2013-01-07 NOTE — ED Notes (Signed)
Patient transported to X-ray 

## 2013-01-07 NOTE — H&P (Signed)
Date: 01/07/2013               Patient Name:  Angela [name] MRN: [id number]  DOB: 13-Apr-1986 Age / Sex: 27 y.o., female   PCP: Santa Lighter              Medical Service: Internal Medicine Teaching Service              Attending Physician: Dr. Murlean Caller    First Contact: Dr. Jeani Hawking Pager: [phone]  Second Contact: Dr. Vertell Novak Pager: [phone]            After Hours (After 5p/  First Contact Pager: [phone]  weekends / holidays): Second Contact Pager: [phone]     Chief Complaint: nausea/vomiting/diarrhea  History of Present Illness: Patient is a 27 y.o. female with a PMHx of Type 1 DM, hypothyroidism, HTN, who presents to [facility] for evaluation of nausea, vomiting, and diarrhea. Pt states that these symptoms began approximately 1 week ago and have improved slightly since then. She states that has continued to use her insulin despite not being able to maintain much of her PO intake. She also complains of productive cough x 2 weeks, but denies any SOB. She complains of occasional midsternal CP that is stabbing and sometimes pressure-like in quality, but states these symptoms have persisted for years and are not worsening. She denies any dysuria or hematuria. She states she has received the influenza vaccine this year, and denies any recent fevers or myalgias (although she admits to chills). Patient's only other complaints are coryza-like symptoms and L eye conjunctivits which have been present for approximately 1 week.   Regarding her home insulin, she uses lantus 35U qhs and sliding scale insulin (novolog flexpen 4-12U tid with meals). She states she has been using increased SSI doses since she began feeling ill ~1 wk prior to admission.   Review of Systems: Pertinent items in HPI.   Current Outpatient Medications: No current facility-administered medications on file prior to encounter.   Current Outpatient Prescriptions on File Prior to Encounter  Medication Sig Dispense  Refill  . albuterol (PROVENTIL HFA;VENTOLIN HFA) 108 (90 BASE) MCG/ACT inhaler Inhale 1-2 puffs into the lungs every 6 (six) hours as needed for wheezing.  1 Inhaler  10  . Diphenhydramine-APAP, sleep, (TYLENOL PM EXTRA STRENGTH PO) Take 2 tablets by mouth at bedtime as needed. For sleep      . esomeprazole (NEXIUM) 20 MG capsule Take 1 capsule (20 mg total) by mouth daily before breakfast.  90 capsule  3  . insulin glargine (LANTUS) 100 UNIT/ML injection Inject 35 Units into the skin at bedtime.  15 pen  3  . levothyroxine (SYNTHROID, LEVOTHROID) 100 MCG tablet Take 1 tablet (100 mcg total) by mouth daily before breakfast.  30 tablet  5  . lisinopril (PRINIVIL,ZESTRIL) 5 MG tablet Take 1 tablet (5 mg total) by mouth daily.  30 tablet  11  . pravastatin (PRAVACHOL) 20 MG tablet Take 1 tablet (20 mg total) by mouth every evening.  30 tablet  11     Allergies: Allergies  Allergen Reactions  . Ciprofloxacin Hives  . Sulfamethoxazole Itching  . Trimethoprim Hives     Past Medical History: Past Medical History  Diagnosis Date  . HLD (hyperlipidemia)   . Depression, major     was on multiple medication before followed by psych but was lost to follow up 2-3 years ago when she go arrested, stopped multiple medications that she was on (  zoloft, abilify, depakote) , never restarted it  . Anemia     baseline Hb 10-11, ferriting 53  . 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 Hypothyroidism     untreated, non compliance  . Asthma   . Hypothyroidism   . DM type 1 (diabetes mellitus, type 1)     uncontrolled due to medication non compliance, DKA admission at [facility] in 2008, Dx age 2   . Abnormal Pap smear of cervix     ascus noted 2007  . Gastritis   . Hypertension     Past Surgical History: Past Surgical History  Procedure Date  . Other surgical history     right foot  . Foot fusion 2006    Family History: Family History  Problem Relation Age of Onset  . Multiple sclerosis  Mother   . Hypothyroidism Mother   . Hyperlipidemia Maternal Grandmother   . Hypertension Maternal Grandfather   . Hypertension Maternal Grandmother   . Prostate cancer Maternal Grandfather   . Diabetes type I Maternal Grandfather   . Breast cancer Paternal Grandmother   . Heart disease Maternal Grandmother     unknown type  . Stroke Mother     at age 62 yo    Social History: History   Social History  . Marital Status: Divorced    Spouse Name: N/A    Number of Children: 0  . Years of Education: 10th grade   Occupational History  . unemployed     worked at a group   Social History Main Topics  . Smoking status: Current Every Day Smoker -- 1.0 packs/day for 2 years    Types: Cigarettes  . Smokeless tobacco: Never Used     Comment: Will let us know when she is ready.  . Alcohol Use: Yes  . Drug Use: No     Comment: remote marijuana use, quit in 2011  . Sexually Active: Yes    Birth Control/ Protection: None   Other Topics Concern  . 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 Lives with her mother who is an Glass blower/designer in [employer]. Smoking Status:  Current, Packs/Day:  2-3Sexual History:  multiple partners in the past, same sex encounters,current partner is a CNA and she is planning to move in with herDrug Use:  Past,, marijuana     Vital Signs: Blood pressure 154/106, pulse 106, temperature 97.6 F (36.4 C), temperature source Oral, resp. rate 14, last menstrual period 12/24/2012, SpO2 97.00%.  Physical Exam: General: Vital signs reviewed and noted. Well-developed, well-nourished, in no acute distress; alert, appropriate and cooperative throughout examination.  Head: Normocephalic, atraumatic.  Eyes: PERRL, EOMI. L eye conjunctival injection/erythema  Nose: Mucous  membranes moist, not inflammed, nonerythematous.  Throat: Oropharynx nonerythematous, no exudate appreciated.   Neck: No deformities, masses, or tenderness noted.  Lungs:  Normal respiratory effort. Clear to auscultation BL without crackles or wheezes.  Heart: RRR. S1 and S2 normal. S4 present.   Abdomen:  BS normoactive. Soft, Nondistended, non-tender.  No masses or organomegaly.  Extremities: No pretibial edema.  Neurologic: A&O X3, CN II - XII are grossly intact. Motor strength is 5/5 in the all 4 extremities, Sensations intact to light touch, Cerebellar signs negative.  Skin: Multiple tattoos.   Lab results:  Comprehensive Metabolic Panel:    Component Value Date/Time   NA 123* 01/07/2013 1542   K 5.4* 01/07/2013 1542   CL 88* 01/07/2013 1542   CO2 26 01/07/2013 1542   BUN 9 01/07/2013 1542   CREATININE 1.04 01/07/2013 1542   CREATININE 0.95 07/18/2012 1213   GLUCOSE 883* 01/07/2013 1542   CALCIUM 9.2 01/07/2013 1542   AST 15 01/07/2013 1542   ALT 11 01/07/2013 1542   ALKPHOS 91 01/07/2013 1542   BILITOT 0.5 01/07/2013 1542   PROT 7.0 01/07/2013 1542   ALBUMIN 3.4* 01/07/2013 1542    CBC:    Component Value Date/Time   WBC 4.1 01/07/2013 1542   HGB 10.2* 01/07/2013 1542   HCT 31.6* 01/07/2013 1542   PLT 170 01/07/2013 1542   MCV 85.6 01/07/2013 1542   NEUTROABS 2.6 01/07/2013 1542   LYMPHSABS 1.2 01/07/2013 1542   MONOABS 0.2 01/07/2013 1542   EOSABS 0.1 01/07/2013 1542   BASOSABS 0.0 01/07/2013 1542    Urinalysis:   Basename 01/07/13 1557  COLORURINE YELLOW  LABSPEC 1.024  PHURINE 7.0  GLUCOSEU >1000*  HGBUR SMALL*  BILIRUBINUR NEGATIVE  KETONESUR NEGATIVE  PROTEINUR NEGATIVE  UROBILINOGEN 0.2  NITRITE NEGATIVE  LEUKOCYTESUR NEGATIVE   Historical Labs: Lab Results  Component Value Date   HGBA1C 13.4 11/01/2012    Lab Results  Component Value Date   CHOL 221* 11/01/2012   HDL 78 11/01/2012   LDLCALC 118* 11/01/2012   TRIG 127 11/01/2012   CHOLHDL 2.8 11/01/2012     Lab Results  Component Value Date   TSH 12.692* 11/01/2012     Assessment & Plan: Patient is a 27 y.o. female with a PMHx of Type 1 DM, hypothyroidism, HTN, who presents to [facility] for evaluation of nausea, vomiting, and diarrhea. She will be admitted for management of HHS  HHS - Likely secondary to recent viral illness, per below. Although pt has T1DM, she has no ketones in her urine, and no anion gap, thus her presentation is much more consistent with HHS than with DKA. This is likely due to pt appropriately increasing her insulin dose while sick, which appears to have suppressed ketone formation but was not enough to control her hyperglycemia. At time of evaluation pt has not received any IVF or insulin in the ED, although 2L NS bolus has been ordered by the ED physician.  - admit to stepdown on tele - 2L NS bolus followed by NS @ 150cc/hr - insulin drip - BMET q2h - replete K+ as indicated per serial BMETs (no K+ on admission as serum K >5.3) - keep NPO  Viral gastroenteritis - pt's complaints of N/V/D most consistent with viral gastroenteritis given their temporal course with improvement in less than one week, particularly as pt also has complaints of coryza/conjunctivitis/cough. - zofran PRN  T1DM - patient has documented evidence of previous episodes of DKA, with likely noncompliance at those times. As her compliance with insulin therapy appears to be improving, this again may explain why she presents with HHS today rather than outright DKA. Her most recent A1c = 13.4 in 10/2012, however, which although is improved over an A1c > 14 in 06/2012, shows she still has considerable hyperglycemia at baseline. Pt's home insulin compliance will be discussed in greater depth after she has recovered from [facility].  - treat HHS, per above - discuss home insulin compliance prior to discharge   Hypothyroidism - TSH = 12.692 on 10/30/2012. It appears pt's compliance with synthroid has  been questionable in  the past, however her TSH has shown marked improvement since 06/2012 when it was ~56.  - cont synthroid 134mcg daily  GERD - on nexium at home. Patient's complaints of a history of intermittent chest pain for the last few years are most likely GERD-related (to note, denies CP on admission). - protonix 40mg  QD   DVT PPX - heparin  CODE STATUS - full  CONSULTS PLACED - N/A  DISPO - Disposition is deferred at this time, awaiting improvement of HHS.   Anticipated discharge in approximately 1-2 day(s).   The patient does have a current PCP Sissy Hoff, [place], MD) and will be requiring OPC follow-up after discharge.   The patient does not have transportation limitations that hinder transportation to clinic appointments.   Services Needed at time of discharge: TO [place]         Y = Yes, Blank = No PT:   OT:   RN:   Equipment:   Other:     Signed: Gae Gallop, MD  PGY-1, Internal Medicine Resident Pager: [phone] (7AM-5PM) 01/07/2013, 5:54 PM

## 2013-01-07 NOTE — ED Provider Notes (Addendum)
History     CSN: [id number]  Arrival date & time 01/07/13  1528   First MD Initiated Contact with Patient 01/07/13 1706      Chief Complaint  Patient presents with  . Hyperglycemia  . Urinary Tract Infection    (Consider location/radiation/quality/duration/timing/severity/associated sxs/prior treatment) Patient is a 27 y.o. female presenting with vomiting. The history is provided by the patient.  Emesis  This is a new problem. The current episode started more than 1 week ago. The problem occurs 2 to 4 times per day. The problem has been gradually improving. The emesis has an appearance of stomach contents. There has been no fever. Associated symptoms include cough, diarrhea and URI. Pertinent negatives include no abdominal pain and no myalgias. Associated symptoms comments: Blood sugar has been running high. Risk factors include ill contacts.    Past Medical History  Diagnosis Date  . HLD (hyperlipidemia)   . Depression, major     was on multiple medication before followed by psych but was lost to follow up 2-3 years ago when she go arrested, stopped multiple medications that she was on (zoloft, abilify, depakote) , never restarted it  . Anemia     baseline Hb 10-11, ferriting 53  . 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 Hypothyroidism     untreated, non compliance  . Asthma   . Hypothyroidism   . DM type 1 (diabetes mellitus, type 1)     uncontrolled due to medication non compliance, DKA admission at [facility] in 2008, Dx age 26   . Abnormal Pap smear of cervix     ascus noted 2007  . Gastritis   . Hypertension     Past Surgical History  Procedure Date  . Other surgical history     right foot  . Foot fusion 2006    Family History  Problem Relation Age of Onset  . Multiple sclerosis Mother   . Hypothyroidism Mother   . Hyperlipidemia Maternal Grandmother   . Hypertension Maternal Grandfather   . Hypertension Maternal Grandmother   . Prostate cancer Maternal  Grandfather   . Diabetes type I Maternal Grandfather   . Breast cancer Paternal Grandmother   . Heart disease Maternal Grandmother     unknown type  . Stroke Mother     at age 82 yo    History  Substance Use Topics  . Smoking status: Current Every Day Smoker -- 1.0 packs/day for 2 years    Types: Cigarettes  . Smokeless tobacco: Never Used     Comment: Will let us know when she is ready.  . Alcohol Use: Yes    OB History    Grav Para Term Preterm Abortions TAB SAB Ect Mult Living   0 0 0 0 0 0 0 0 0 0       Review of Systems  HENT: Positive for congestion and rhinorrhea.   Eyes: Positive for discharge and itching.       Started 2 days ago  Respiratory: Positive for cough.   Gastrointestinal: Positive for vomiting and diarrhea. Negative for abdominal pain.  Musculoskeletal: Negative for myalgias.  All other systems reviewed and are negative.    Allergies  Ciprofloxacin; Sulfamethoxazole; and Trimethoprim  Home Medications   Current Outpatient Rx  Name  Route  Sig  Dispense  Refill  . ALBUTEROL SULFATE HFA 108 (90 BASE) MCG/ACT IN AERS   Inhalation   Inhale 1-2 puffs into the lungs every 6 (six) hours as  needed for wheezing.   1 Inhaler   10   . TYLENOL PM EXTRA STRENGTH PO   Oral   Take 2 tablets by mouth at bedtime as needed. For sleep         . ESOMEPRAZOLE MAGNESIUM 20 MG PO CPDR   Oral   Take 1 capsule (20 mg total) by mouth daily before breakfast.   90 capsule   3   . IBUPROFEN 200 MG PO TABS   Oral   Take 400 mg by mouth daily as needed. For pain         . INSULIN ASPART 100 UNIT/ML [place] SOLN   Subcutaneous   Inject 4-12 Units into the skin 3 (three) times daily before meals. Based on sliding scale - pt has chart at home         . INSULIN GLARGINE 100 UNIT/ML [place] SOLN   Subcutaneous   Inject 35 Units into the skin at bedtime.   15 pen   3   . LEVOTHYROXINE SODIUM 100 MCG PO TABS   Oral   Take 1 tablet (100 mcg total) by mouth daily  before breakfast.   30 tablet   5   . LISINOPRIL 5 MG PO TABS   Oral   Take 1 tablet (5 mg total) by mouth daily.   30 tablet   11   . PRAVASTATIN SODIUM 20 MG PO TABS   Oral   Take 1 tablet (20 mg total) by mouth every evening.   30 tablet   11     BP 154/106  Pulse 106  Temp 97.6 F (36.4 C) (Oral)  Resp 14  SpO2 97%  LMP 12/24/2012  Physical Exam  Nursing note and vitals reviewed. Constitutional: She is oriented to person, place, and time. She appears well-developed and well-nourished. No distress.  HENT:  Head: Normocephalic and atraumatic.  Mouth/Throat: Oropharynx is clear and moist. Mucous membranes are dry.  Eyes: EOM are normal. Pupils are equal, round, and reactive to light. Right eye exhibits no discharge. Left eye exhibits discharge and exudate. Left conjunctiva is injected.  Neck: Normal range of motion. Neck supple.  Cardiovascular: Normal rate, regular rhythm and intact distal pulses.   No murmur heard. Pulmonary/Chest: Effort normal and breath sounds normal. No respiratory distress. She has no wheezes. She has no rales.  Abdominal: Soft. She exhibits no distension. There is no tenderness. There is no rebound and no guarding.  Musculoskeletal: Normal range of motion. She exhibits no edema and no tenderness.  Neurological: She is alert and oriented to person, place, and time.  Skin: Skin is warm and dry. No rash noted. No erythema. There is pallor.  Psychiatric: She has a normal mood and affect. Her behavior is normal.    ED Course  Procedures (including critical care time)  Labs Reviewed  URINALYSIS, ROUTINE W REFLEX MICROSCOPIC - Abnormal; Notable for the following:    Glucose, UA >1000 (*)     Hgb urine dipstick SMALL (*)     All other components within normal limits  CBC WITH DIFFERENTIAL - Abnormal; Notable for the following:    RBC 3.69 (*)     Hemoglobin 10.2 (*)     HCT 31.6 (*)     All other components within normal limits  COMPREHENSIVE  METABOLIC PANEL - Abnormal; Notable for the following:    Sodium 123 (*)     Potassium 5.4 (*)     Chloride 88 (*)  Glucose, Bld 883 (*)     Albumin 3.4 (*)     GFR calc non Af Amer 73 (*)     GFR calc Af Amer 85 (*)     All other components within normal limits  GLUCOSE, CAPILLARY - Abnormal; Notable for the following:    Glucose-Capillary >600 (*)     All other components within normal limits  POCT PREGNANCY, URINE  URINE MICROSCOPIC-ADD ON   No results found.   Date: 01/07/2013  Rate: 92  Rhythm: normal sinus rhythm  QRS Axis: normal  Intervals: normal  ST/T Wave abnormalities: normal  Conduction Disutrbances: none  Narrative Interpretation: unremarkable      1. Hyperglycemia   2. Conjunctivitis       MDM   Patient with a history of diabetes who comes in today with hyperglycemia, vomiting, diarrhea and conjunctivitis. Also is complaining of a productive cough. She denies any fevers and has stable vital signs here. Patient is found to be hyperglycemic at 883 with normal renal function and evidence of dehydration. She is no sign of UTI and is not pregnant. Patient given 2 L of IV fluids and started on the glucose stabilizer. Will admit for hyperglycemia. Chest x-ray pending to rule out other causes for her hyperglycemia other than her noncompliance.  CRITICAL CARE Performed by: Blanchie Dessert   Total critical care time: 30  Critical care time was exclusive of separately billable procedures and treating other patients.  Critical care was necessary to treat or prevent imminent or life-threatening deterioration.  Critical care was time spent personally by me on the following activities: development of treatment plan with patient and/or surrogate as well as nursing, discussions with consultants, evaluation of patient's response to treatment, examination of patient, obtaining history from patient or surrogate, ordering and performing treatments and interventions,  ordering and review of laboratory studies, ordering and review of radiographic studies, pulse oximetry and re-evaluation of patient's condition.       Blanchie Dessert, MD 01/07/13 1744  Blanchie Dessert, MD 01/07/13 1919

## 2013-01-07 NOTE — ED Notes (Signed)
Patient says she is a diabetic and has not been controlling her blood sugar properly.  The patient says she has been having diarrhea and vomiting her eye is red.  She says both her legs are also hurting but she thinks it is due to her blood sugar been high.

## 2013-01-07 NOTE — ED Notes (Signed)
Family at bedside. 

## 2013-01-07 NOTE — ED Notes (Signed)
Critical high CBG of 853

## 2013-01-07 NOTE — H&P (Signed)
[facility] Admission Note Hospital Admission Note Date: 01/07/2013  Patient name: Angela [name] Medical record number: [id number] Date of birth: 09/14/1986 Age: 27 y.o. Gender: female PCP: Santa Lighter, MD  Medical Service:  Attending physician:     1st Contact:     Pager: 2nd Contact:     Pager: After 5 pm or weekends: 1st Contact:      Pager: [phone] 2nd Contact:      Pager: [phone]  Chief Complaint:  Vomiting, nausea, diarrhea, and persistent cough.  History of Present Illness: Angela [name] is a 27 yo AA female with a history of Type I DM who presents to the ED after approximately 1 week of nausea with vomiting 3-4 times per day. Patients reports that she has had diarrhea for approximately 1 week and a cough for approximately two weeks; the diarrhea is watery and non-bloody, and the cough is productive of non-bloody sputum. She has experienced increased urination frequency in the past week, especially at night. Angela [name] reports occasional substernal chest pain that is not exertion-dependent and that radiates down the left side of her back. She reports a history of GERD that is treated with Nexium. She also complains of "pink eye" (conjunctivitis) in her left eye of approximately 2 days. Patient reports having been diagnosed with T1DM when she was 82, and that she currently takes 35 units lantus and sliding scale insulin.    Meds: Current Outpatient Rx  Name  Route  Sig  Dispense  Refill  . ALBUTEROL SULFATE HFA 108 (90 BASE) MCG/ACT IN AERS   Inhalation   Inhale 1-2 puffs into the lungs every 6 (six) hours as needed for wheezing.   1 Inhaler   10   . TYLENOL PM EXTRA STRENGTH PO   Oral   Take 2 tablets by mouth at bedtime as needed. For sleep         . ESOMEPRAZOLE MAGNESIUM 20 MG PO CPDR   Oral   Take 1 capsule (20 mg total) by mouth daily before breakfast.   90 capsule   3   . IBUPROFEN 200 MG PO TABS   Oral   Take 400 mg by mouth daily as needed. For  pain         . INSULIN ASPART 100 UNIT/ML [place] SOLN   Subcutaneous   Inject 4-12 Units into the skin 3 (three) times daily before meals. Based on sliding scale - pt has chart at home         . INSULIN GLARGINE 100 UNIT/ML [place] SOLN   Subcutaneous   Inject 35 Units into the skin at bedtime.   15 pen   3   . LEVOTHYROXINE SODIUM 100 MCG PO TABS   Oral   Take 1 tablet (100 mcg total) by mouth daily before breakfast.   30 tablet   5   . LISINOPRIL 5 MG PO TABS   Oral   Take 1 tablet (5 mg total) by mouth daily.   30 tablet   11   . PRAVASTATIN SODIUM 20 MG PO TABS   Oral   Take 1 tablet (20 mg total) by mouth every evening.   30 tablet   11     Allergies: Allergies as of 01/07/2013 - Review Complete 01/07/2013  Allergen Reaction Noted  . Ciprofloxacin Hives 03/02/2012  . Sulfamethoxazole Itching 09/14/2006  . Trimethoprim Hives 09/14/2006   Past Medical History  Diagnosis Date  . HLD (hyperlipidemia)   . Depression, major  was on multiple medication before followed by psych but was lost to follow up 2-3 years ago when she go arrested, stopped multiple medications that she was on (zoloft, abilify, depakote) , never restarted it  . Anemia     baseline Hb 10-11, ferriting 53  . 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 Hypothyroidism     untreated, non compliance  . Asthma   . Hypothyroidism   . DM type 1 (diabetes mellitus, type 1)     uncontrolled due to medication non compliance, DKA admission at [facility] in 2008, Dx age 67   . Abnormal Pap smear of cervix     ascus noted 2007  . Gastritis   . Hypertension    Past Surgical History  Procedure Date  . Other surgical history     right foot  . Foot fusion 2006   Family History  Problem Relation Age of Onset  . Multiple sclerosis Mother   . Hypothyroidism Mother   . Hyperlipidemia Maternal Grandmother   . Hypertension Maternal Grandfather   . Hypertension Maternal Grandmother   . Prostate cancer Maternal  Grandfather   . Diabetes type I Maternal Grandfather   . Breast cancer Paternal Grandmother   . Heart disease Maternal Grandmother     unknown type  . Stroke Mother     at age 66 yo   History   Social History  . Marital Status: Divorced    Spouse Name: N/A    Number of Children: 0  . Years of Education: 10th grade   Occupational History  . unemployed     worked at a group   Social History Main Topics  . Smoking status: Current Every Day Smoker -- 1.0 packs/day for 2 years    Types: Cigarettes  . Smokeless tobacco: Never Used     Comment: Will let us know when she is ready.  . Alcohol Use: Yes  . Drug Use: No     Comment: remote marijuana use, quit in 2011  . Sexually Active: Yes    Birth Control/ Protection: None   Other Topics Concern  . 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 Lives with her mother who is an Glass blower/designer in [employer]. Smoking Status:  Current, Packs/Day:  2-3Sexual History:  multiple partners in the past, same sex encounters,current partner is a CNA and she is planning to move in with herDrug Use:  Past,, marijuana    Review of Systems: Constitutional: positive for chills Eyes: positive for irritation and redness Ears, nose, mouth, throat, and face: negative Respiratory: positive for cough and sputum Cardiovascular: positive for chest pressure/discomfort Gastrointestinal: positive for diarrhea, dyspepsia, nausea, reflux symptoms and vomiting Genitourinary:positive for  , frequency and nocturia Musculoskeletal:positive for back pain Endocrine: positive for diabetic symptoms including polyuria  Physical Exam: Blood pressure 154/106, pulse 106, temperature 97.6 F (36.4 C), temperature source Oral, resp. rate 14, last menstrual period 12/24/2012, SpO2  97.00%.  General appearance: alert, cooperative, appears stated age and no distress Head: Normocephalic, without obvious abnormality, atraumatic Back: symmetric, no curvature. ROM normal. No CVA tenderness. Lungs: clear to auscultation bilaterally Extremities: extremities normal, atraumatic, no cyanosis or edema  Lab results:  Labs Reviewed   URINALYSIS, ROUTINE W REFLEX MICROSCOPIC - Abnormal; Notable for the following:  Glucose, UA  >1000 (*)      Hgb urine dipstick  SMALL (*)      All other components within normal limits   CBC WITH DIFFERENTIAL - Abnormal; Notable for the following:    RBC  3.69 (*)      Hemoglobin  10.2 (*)      HCT  31.6 (*)      All other components within normal limits   COMPREHENSIVE METABOLIC PANEL - Abnormal; Notable for the following:    Sodium  123 (*)      Potassium  5.4 (*)      Chloride  88 (*)      Glucose, Bld  883 (*)      Albumin  3.4 (*)      GFR calc non Af Amer  73 (*)      GFR calc Af Amer  85 (*)      All other components within normal limits   GLUCOSE, CAPILLARY - Abnormal; Notable for the following:    Glucose-Capillary  >600 (*)      All other components within normal limits   POCT PREGNANCY, URINE   URINE MICROSCOPIC-ADD ON    No results found.  Imaging results:  No results found.  Other results: EKG: nonspecific ST and T waves changes.  Assessment & Plan by Problem: Angela [name] is a 27 yo AA female who presented to the ED with a CC of nausea/vomiting who was found to have a blood glucose level of 883 and urine glucose of 1000. It appears that her reason for presentation are more constitutional, related to her GI complaints, conjunctivitis, and cough, rather than her diabetes. She was calm and pleasant and agreed to overnight admission to manage her hyperglycemia.   Type 1 DM/Hyperglycemic Hyperosmolar Syndrome: Angela [name] has a 12-year history of Type 1 DM which she reports is managed with 35 units of Lantus and 4-12 units  of sliding scale Novalog before meals. She reports a several-day history of increased urinary frequency and urgency, consistent with her blood glucose at presentation of 883 and her urine glucose greater than 1000. With a serum sodium of 123 and BUN of 9, her serum osmolality is calculated to be 298 mOsm/kg. She appeared generally well was negative for neurological symptoms, both in history and on exam. However, her urine was negative for ketones and she denied excessive ventilation or abdominal pains, suggesting that her condition is more likely due to hyperosmolar hyperglycemic syndrome (HHS) rather than DKA. We will thus initiate treatment for HHS that involved fluid replacement with initiation of insulin drip. Her potassium level is likely artificially high at 5.2 due to excess blood glucose and depleted insulin, so we anticipate that it will drop once we normalize her glucose and insulin levels and replace fluids; we will continue to watch her potassium levels closely. - IV NS 2 L bolus - IV NS at 150 mL/hr for ~12 hrs - initiate insulin drip - monitor potassium levels - monitor blood and urine glucose levels and A1c  Emesis/diarrhea: Patient's gastrointestinal symptoms of nausea, vomiting, and diarrhea are most likely due to a GI illness that is unrelated to her hyperglycemic state. This is likely if viral origin and it appears to have been resolving in the past few days. We anticipate it will continue to improve during her stay. We will offer her ondansetron PRN for emesis. - Ondansetron 4 mg tablet PRN  Conjunctivitis: Angela [name] complaints of conjunctivitis are likely of  viral origin; we will monitor this and offer medication for pain. - Acetaminophen 650 mg PRN  GERD: Patient's history of GERD is likely responsible for her complaints of reflux symptoms and occasional retrosternal chest pain. Her reflux appears to be well-managed with a PPI, which we will continue here. - Protonix 40 mg  qdaily   Dispo: Disposition is deferred at this time, awaiting improvement of current medical problems. Anticipated discharge in approximately 1 day.   The patient does have a current PCP Sissy Hoff, [place], [place]), therefore will be requiring OPC follow-up after discharge.   The patient does not have transportation limitations that hinder transportation to clinic appointments.  Signed: Charlott Holler 01/07/2013, 5:52 PM  This is a Careers information officer Note.  The care of the patient was discussed with Dr. Para Skeans and the assessment and plan was formulated with their assistance.  Please see their note for official documentation of the patient encounter.

## 2013-01-07 NOTE — ED Notes (Signed)
Per pt st sfor 1 weeks her CBG has been up, she has been vomiting and she think she has a UTI

## 2013-01-08 DIAGNOSIS — H109 Unspecified conjunctivitis: Secondary | ICD-10-CM

## 2013-01-08 DIAGNOSIS — E109 Type 1 diabetes mellitus without complications: Secondary | ICD-10-CM

## 2013-01-08 LAB — OSMOLALITY: Osmolality: 287 mOsm/kg (ref 275–300)

## 2013-01-08 LAB — BASIC METABOLIC PANEL
CO2: 27 mEq/L (ref 19–32)
CO2: 25 mEq/L (ref 19–32)
Calcium: 8.5 mg/dL (ref 8.4–10.5)
Chloride: 100 mEq/L (ref 96–112)
Chloride: 102 mEq/L (ref 96–112)
Chloride: 103 mEq/L (ref 96–112)
Chloride: 102 mEq/L (ref 96–112)
Creatinine, Ser: 0.8 mg/dL (ref 0.50–1.10)
Creatinine, Ser: 0.8 mg/dL (ref 0.50–1.10)
GFR calc Af Amer: >90 mL/min (ref >90)
GFR calc Af Amer: >90 mL/min (ref >90)
Glucose, Bld: 146 mg/dL — ABNORMAL HIGH (ref 70–99)
Potassium: 3.3 mEq/L — ABNORMAL LOW (ref 3.5–5.1)
Potassium: 3.6 mEq/L (ref 3.5–5.1)
Potassium: 4.1 mEq/L (ref 3.5–5.1)
Potassium: 4.4 mEq/L (ref 3.5–5.1)
Sodium: 137 mEq/L (ref 135–145)
Sodium: 138 mEq/L (ref 135–145)
Sodium: 136 mEq/L (ref 135–145)
Sodium: 137 mEq/L (ref 135–145)

## 2013-01-08 LAB — GLUCOSE, CAPILLARY
Glucose-Capillary: 108 mg/dL — ABNORMAL HIGH (ref 70–99)
Glucose-Capillary: 137 mg/dL — ABNORMAL HIGH (ref 70–99)
Glucose-Capillary: 141 mg/dL — ABNORMAL HIGH (ref 70–99)
Glucose-Capillary: 149 mg/dL — ABNORMAL HIGH (ref 70–99)
Glucose-Capillary: 167 mg/dL — ABNORMAL HIGH (ref 70–99)
Glucose-Capillary: 237 mg/dL — ABNORMAL HIGH (ref 70–99)
Glucose-Capillary: 160 mg/dL — ABNORMAL HIGH (ref 70–99)

## 2013-01-08 LAB — CBC
HCT: 26.8 % — ABNORMAL LOW (ref 36.0–46.0)
Hemoglobin: 8.9 g/dL — ABNORMAL LOW (ref 12.0–15.0)
MCH: 27.4 pg (ref 26.0–34.0)
MCHC: 33.2 g/dL (ref 30.0–36.0)
RDW: 12.5 % (ref 11.5–15.5)

## 2013-01-08 LAB — MAGNESIUM: Magnesium: 1.5 mg/dL (ref 1.5–2.5)

## 2013-01-08 LAB — HEMOGLOBIN A1C
Hgb A1c MFr Bld: 14 % — ABNORMAL HIGH (ref <5.7)
Mean Plasma Glucose: 355 mg/dL — ABNORMAL HIGH (ref <117)

## 2013-01-08 LAB — TROPONIN I: Troponin I: <0.3 ng/mL (ref <0.30)

## 2013-01-08 MED ORDER — CIPROFLOXACIN HCL 0.3 % OP SOLN: 1.0000 [drp] | OPHTHALMIC | Status: DC

## 2013-01-08 MED ORDER — INSULIN GLARGINE 100 UNIT/ML ~~LOC~~ SOLN
30.0000 [IU] | Freq: Once | SUBCUTANEOUS | Status: AC
  Administered 2013-01-08: 30 [IU] via SUBCUTANEOUS

## 2013-01-08 MED ORDER — POTASSIUM CHLORIDE 10 MEQ/100ML IV SOLN
10.0000 meq | INTRAVENOUS | Status: AC
  Administered 2013-01-08 (×2): 10 meq via INTRAVENOUS
  Filled 2013-01-08 (×2): qty 100

## 2013-01-08 MED ORDER — INSULIN ASPART 100 UNIT/ML ~~LOC~~ SOLN
0.0000 [IU] | Freq: Three times a day (TID) | SUBCUTANEOUS | Status: DC
  Administered 2013-01-08: 3 [IU] via SUBCUTANEOUS

## 2013-01-08 NOTE — Progress Notes (Signed)
Medical Student Daily Progress Note  Subjective: Angela [name] is a 27 yo female with a 12-year history of poorly controlled T1DM who was found to have a blood glucose level of 883 when she presented to the ED yesterday for complaints of nausea, vomiting, and diarrhea. Angela [name] this morning reports feeling better, though tired because of interrupted sleep overnight. She denies nausea, vomiting, diarrhea, or difficulty breathing overnight. She complains of burning and itching in her left eye and requests antibiotic ointment for it. She requests discharge today.   Objective: Vital signs in last 24 hours: Filed Vitals:   01/07/13 2300 01/07/13 2338 01/08/13 0000 01/08/13 0755  BP:  123/84 114/75 114/77  Pulse:  96 91 95  Temp:  98 F (36.7 C)  98.2 F (36.8 C)  TempSrc:  Oral  Oral  Resp:  19 18 15   Height: 5\' 4"  (1.626 m)     Weight: 74.707 kg (164 lb 11.2 oz)     SpO2:  100% 99% 94%   Weight change:   Intake/Output Summary (Last 24 hours) at 01/08/13 0935 Last data filed at 01/08/13 0800  Gross per 24 hour  Intake   2380 ml  Output      0 ml  Net   2380 ml   Physical Exam: General appearance: alert, cooperative and no distress Lungs: clear to auscultation bilaterally Heart: regular rate and rhythm, S1, S2 normal, no murmur, click, rub or gallop Abdomen: soft, non-tender; bowel sounds normal; no masses,  no organomegaly Extremities: extremities normal, atraumatic, no cyanosis or edema Pulses: 2+ and symmetric Lab Results: @labtest2 @ Micro Results: Recent Results (from the past 240 hour(s))  MRSA PCR SCREENING     Status: Normal   Collection Time   01/07/13  9:51 PM      Component Value Range Status Comment   MRSA by PCR NEGATIVE  NEGATIVE Final    Studies/Results: Dg Chest 2 View  01/07/2013  *RADIOLOGY REPORT*  Clinical Data: Cough and chest congestion.  Nausea and vomiting. Diabetes and hypertension.  Asthma.  CHEST - 2 VIEW  Comparison:  04/25/2012  Findings:  The  heart size and mediastinal contours are within normal limits.  Both lungs are clear.  The visualized skeletal structures are unremarkable.  IMPRESSION: No active cardiopulmonary disease.   Original Report Authenticated By: Earle Gell, M.D.    Medications: I have reviewed the patient's current medications. Scheduled Meds:   . heparin  5,000 Units Subcutaneous Q8H  . insulin aspart  0-15 Units Subcutaneous TID WC  . levothyroxine  100 mcg Oral QAC breakfast  . pantoprazole  40 mg Oral Daily   Continuous Infusions:   . sodium chloride 175 mL/hr at 01/07/13 2148   PRN Meds:.acetaminophen, albuterol, dextrose, ondansetron, ondansetron Assessment/Plan: Angela [name] is a 27 yo female who presented to the ED with a CC of nausea/vomiting who was admitted to the stepdown unit after the ED found a blood glucose level of 883 and urine glucose of 1000. It appears that her reasons for initial presentation to the ED - GI complaints, conjunctivitis, and cough - have resolved as of this morning. She was pleasant and communicative and endorses readiness to be discharged today.   Hyperglycemia: Angela [name] has a 12-year history of poorly controlled T1DM and presented to the ED after a 2-week history of gastrointestinal symptoms. She reported a several-day history of polyuria and urinary urgency and laboratory values revealed a blood glucose of 883, a urine glucose of 1000, a serum  osmolality of 298 mOsm/kg, and negative urine ketones. We had originally suspected hyperosmolar hyperglycemic syndrome rather than diabetic ketotic acidosis, due to her lack of urine ketones; however, in light of her normal mental status and serum osmolality below 300, we now feel her presentation is more consistent with a hyperglycemic episode, likely precipitated by viral gastroenteritis. We treated her hyperglycemia with a 2 L NS bolus followed by NS infusion at 150 cc/hr and an insulin drip of 0.1 U/kg. Her blood glucose dropped to 146 by  1:30 AM this morning and capillary blood glucose values have remained stable between 100 and 200. Her serum potassium was 5.4 at admission but dropped to 3.6 this morning. Due to her glucose stabilization and the improvement of her gastrointestinal symptoms, we feel that her hyperglycemic episode has resolved, and we will focus on outpatient improvement of her T1DM (see below). - discontinue IV fluids and insulin drip - transition to outpatient insulin management - continue to monitor potassium  T1DM: Angela [name] A1c at admission was 14.0, indicating a poor compliance with diet, insulin therapy, or both. She has several past admissions for DKA. We had a long conversation with Angela [name] regarding her adherence to her medications. She uses 35 U Lantus daily with 4-12 U sliding scale Novalog after meals, usually 5U, and she endorses adherence to this regimen. However, she denies measuring blood glucose at home (though she has strips for this purpose) and she admits to very poor dietary compliance. For example, she admits to drinking at least one glass of apple juice/day and to eating fast food several times per week. Although she claims to take her insulin regularly, her current A1c of 14.0 and previous value of 13.4 on 11/01/2012, complete compliance appears unlikely. We held a long discussion with Angela [name] regarding the long-term health risks of poor diabetes compliance. She agrees to make initiate the following interventions: - measure and record blood glucose levels daily - make a reasonable effort to reduce consumption of juice and fast food - continue with home regimen of insulin - follow-up with the diabetes dietician in the outpatient clinic We made follow-up appointments for Angela [name] for next week in the IM clinic and with Angela [name].  Smoking: Angela [name] endorses smoking several cigarettes per day and up to one pack-day. The risks associated with smoking are discussed with patient, and she  endorses a willingness to decrease how much she smokes and consider quitting.  Emesis/diarrhea: This was likely of viral origin and it appears to have resolved.  Conjunctivitis: Angela [name] complaints of conjunctivitis are likely of viral origin. Although she complains of persistent discomfort and pruritis, her eye appears normal on exam. We recommend acetaminophen 650 mg PRN for pain and advise her to follow-up with [place] should her symptoms deteriorate.   GERD: Patient's history of GERD is likely responsible for her complaints of reflux symptoms and occasional retrosternal chest pain. Her reflux appears to be well-managed with a PPI, which we recommend she continue at home.   Active Problems:  * No active hospital problems. *    LOS: 1 day   This is a Careers information officer Note.  The care of the patient was discussed with Dr. Para Skeans and the assessment and plan formulated with their assistance.  Please see their attached note for official documentation of the daily encounter.  Charlott Holler 01/08/2013, 9:35 AM   Dispo: Disposition is deferred at this time, awaiting improvement of current medical problems.  Anticipated discharge today.  The patient does have a current PCP Sissy Hoff, [place], [place]), therefore is requiring OPC follow-up after discharge.   The patient does not have transportation limitations that hinder transportation to clinic appointments.  .Services Needed at time of discharge: Y = Yes, Blank = No PT:   OT:   RN:   Equipment:   Other:

## 2013-01-08 NOTE — Progress Notes (Signed)
Resident Addendum to Medical Student Note   I have seen and examined the patient, and agree with the the medical student assessment and plan outlined above. Please see my brief note below for additional details.  S: Patient feels well this AM, and was reported to tolerate a full diet without difficulty. Denies any N/V/D or abdominal pain. States she is ready to go home.    OBJECTIVE: VS: Reviewed  Meds: Reviewed  Labs: Reviewed  Imaging: Reviewed   Physical Exam: General: Vital signs reviewed and noted. Well-developed, well-nourished, in no acute distress; alert, appropriate and cooperative throughout examination.  HEENT: Normocephalic, atraumatic  Lungs:  Normal respiratory effort. Clear to auscultation BL without crackles or wheezes.  Heart: RRR. S1 and S2 normal without gallop, murmur, or rubs.  Abdomen:  BS normoactive. Soft, Nondistended, non-tender.  No masses or organomegaly.  Extremities: No pretibial edema.     ASSESSMENT/ PLAN: Pt is a 27 y.o. yo female with a PMHx of T1DM who was admitted on 01/07/2013 with symptoms of hyperglycemia, which was determined to be secondary to recent viral illness.   T1DM - pt did not truly meet criteria for HHS as she had no mental status changes and serum osmolarity was < 320. Her hyperglycemia likely did not cause her to go into DKA due to appropriate continued and escalated use of insulin while she was ill. She will be discharged home today and will follow-up in the [facility] and with Angela [name] to discuss compliance with her insulin regimen, as there is concern she hasn't been fully compliant in the past, and she admits to not checking CBGs at home (her biggest issue appears to be related to her diet, as she eats many high glycemic index foods). A1c = 14, essentially unchanged from her previous A1c in 10/2012 of 13.4.  - d/c home with no changes in home insulin regimen   Length of Stay: 1   Angela [name] R  01/08/2013, 1:26 PM

## 2013-01-08 NOTE — H&P (Signed)
INTERNAL MEDICINE TEACHING SERVICE Attending Admission Note  Date: 01/08/2013  Patient name: Angela [name]  Medical record number: [id number]  Date of birth: [birth year]    I have seen and evaluated Angela [name] and discussed their care with the Residency Team.  27 year old female with a past medical history significant for type 1 diabetes mellitus, hypothyroidism, hypertension, presented due to nausea vomiting and diarrhea. Patient states that she has had these symptoms for approximately one week and has continued to take her insulin but has had notable decreased tolerance of by mouth intake. She also admits to a productive cough which has slowly improved and denies any associated shortness of breath. She denies any dysuria. She states she is up-to-date on her vaccines including influenza. She admits to some discomfort over her left high which she believes is related to an infection. She states she takes Lantus 35 units every night's and uses a sliding scale to dose her NovoLog insulin. This can vary between 4 and 12 units with meals. She was admitted with a blood sugar of over 800 and has received appropriate treatment with IV fluids and insulin. She has not had evidence of an anion gap. She did have some hyperosmolarity noted which was under 300. She has noted some crusty sensation over her left eye, but this is improving. Discomfort upon moving her left eye.  Physical Exam: Blood pressure 124/83, pulse 92, temperature 98 F (36.7 C), temperature source Oral, resp. rate 16, height 5\' 4"  (1.626 m), weight 164 lb 11.2 oz (74.707 kg), last menstrual period 12/24/2012, SpO2 97.00%.  General: Vital signs reviewed and noted. Well-developed, well-nourished, in no acute distress; alert, appropriate and cooperative throughout examination.  Head: Normocephalic, atraumatic.  Eyes: PERRL, EOMI.  Mild erythema and edema surrounding left eye. Clear drainage.  Nose: Mucous membranes moist, not inflammed,  nonerythematous.  Throat: Oropharynx nonerythematous, no exudate appreciated.   Neck: No deformities, masses, or tenderness noted.Supple, No carotid Bruits, no JVD.  Lungs:  Normal respiratory effort. Clear to auscultation BL without crackles or wheezes.  Heart: RRR. S1 and S2 normal without gallop, murmur, or rubs.  Abdomen:  BS normoactive. Soft, Nondistended, non-tender.  No masses or organomegaly.  Extremities: No pretibial edema.  Neurologic: A&O X3, CN II - XII are grossly intact. Motor strength is 5/5 in the all 4 extremities, Sensations intact to light touch, Cerebellar signs negative.  Skin: No visible rashes, scars.    Lab results: Results for orders placed during the hospital encounter of 01/07/13 (from the past 24 hour(s))  CBC WITH DIFFERENTIAL     Status: Abnormal   Collection Time   01/07/13  3:42 PM      Component Value Range   WBC 4.1  4.0 - 10.5 K/uL   RBC 3.69 (*) 3.87 - 5.11 MIL/uL   Hemoglobin 10.2 (*) 12.0 - 15.0 g/dL   HCT 31.6 (*) 36.0 - 46.0 %   MCV 85.6  78.0 - 100.0 fL   MCH 27.6  26.0 - 34.0 pg   MCHC 32.3  30.0 - 36.0 g/dL   RDW 12.9  11.5 - 15.5 %   Platelets 170  150 - 400 K/uL   Neutrophils Relative 64  43 - 77 %   Neutro Abs 2.6  1.7 - 7.7 K/uL   Lymphocytes Relative 29  12 - 46 %   Lymphs Abs 1.2  0.7 - 4.0 K/uL   Monocytes Relative 5  3 - 12 %   Monocytes Absolute 0.2  0.1 -  1.0 K/uL   Eosinophils Relative 1  0 - 5 %   Eosinophils Absolute 0.1  0.0 - 0.7 K/uL   Basophils Relative 0  0 - 1 %   Basophils Absolute 0.0  0.0 - 0.1 K/uL  COMPREHENSIVE METABOLIC PANEL     Status: Abnormal   Collection Time   01/07/13  3:42 PM      Component Value Range   Sodium 123 (*) 135 - 145 mEq/L   Potassium 5.4 (*) 3.5 - 5.1 mEq/L   Chloride 88 (*) 96 - 112 mEq/L   CO2 26  19 - 32 mEq/L   Glucose, Bld 883 (*) 70 - 99 mg/dL   BUN 9  6 - 23 mg/dL   Creatinine, Ser 1.04  0.50 - 1.10 mg/dL   Calcium 9.2  8.4 - 10.5 mg/dL   Total Protein 7.0  6.0 - 8.3 g/dL     Albumin 3.4 (*) 3.5 - 5.2 g/dL   AST 15  0 - 37 U/L   ALT 11  0 - 35 U/L   Alkaline Phosphatase 91  39 - 117 U/L   Total Bilirubin 0.5  0.3 - 1.2 mg/dL   GFR calc non Af Amer 73 (*) >90 mL/min   GFR calc Af Amer 85 (*) >90 mL/min  GLUCOSE, CAPILLARY     Status: Abnormal   Collection Time   01/07/13  3:44 PM      Component Value Range   Glucose-Capillary >600 (*) 70 - 99 mg/dL   Comment 1 Documented in Chart     Comment 2 Notify RN    URINALYSIS, ROUTINE W REFLEX MICROSCOPIC     Status: Abnormal   Collection Time   01/07/13  3:57 PM      Component Value Range   Color, Urine YELLOW  YELLOW   APPearance CLEAR  CLEAR   Specific Gravity, Urine 1.024  1.005 - 1.030   pH 7.0  5.0 - 8.0   Glucose, UA >1000 (*) NEGATIVE mg/dL   Hgb urine dipstick SMALL (*) NEGATIVE   Bilirubin Urine NEGATIVE  NEGATIVE   Ketones, ur NEGATIVE  NEGATIVE mg/dL   Protein, ur NEGATIVE  NEGATIVE mg/dL   Urobilinogen, UA 0.2  0.0 - 1.0 mg/dL   Nitrite NEGATIVE  NEGATIVE   Leukocytes, UA NEGATIVE  NEGATIVE  URINE MICROSCOPIC-ADD ON     Status: Normal   Collection Time   01/07/13  3:57 PM      Component Value Range   Squamous Epithelial / LPF RARE  RARE   RBC / HPF 0-2  <3 RBC/hpf  POCT PREGNANCY, URINE     Status: Normal   Collection Time   01/07/13  4:07 PM      Component Value Range   Preg Test, Ur NEGATIVE  NEGATIVE  BASIC METABOLIC PANEL     Status: Abnormal   Collection Time   01/07/13  7:36 PM      Component Value Range   Sodium 128 (*) 135 - 145 mEq/L   Potassium 4.3  3.5 - 5.1 mEq/L   Chloride 92 (*) 96 - 112 mEq/L   CO2 26  19 - 32 mEq/L   Glucose, Bld 600 (*) 70 - 99 mg/dL   BUN 10  6 - 23 mg/dL   Creatinine, Ser 0.97  0.50 - 1.10 mg/dL   Calcium 8.7  8.4 - 10.5 mg/dL   GFR calc non Af Amer 79 (*) >90 mL/min   GFR calc Af  Amer >90  >90 mL/min  HEMOGLOBIN A1C     Status: Abnormal   Collection Time   01/07/13  7:36 PM      Component Value Range   Hemoglobin A1C 14.0 (*) <5.7 %   Mean  Plasma Glucose 355 (*) <117 mg/dL  TROPONIN I     Status: Normal   Collection Time   01/07/13  7:36 PM      Component Value Range   Troponin I <0.30  <0.30 ng/mL  GLUCOSE, CAPILLARY     Status: Abnormal   Collection Time   01/07/13  8:12 PM      Component Value Range   Glucose-Capillary 585 (*) 70 - 99 mg/dL  BASIC METABOLIC PANEL     Status: Abnormal   Collection Time   01/07/13  9:25 PM      Component Value Range   Sodium 133 (*) 135 - 145 mEq/L   Potassium 4.0  3.5 - 5.1 mEq/L   Chloride 99  96 - 112 mEq/L   CO2 24  19 - 32 mEq/L   Glucose, Bld 357 (*) 70 - 99 mg/dL   BUN 9  6 - 23 mg/dL   Creatinine, Ser 0.92  0.50 - 1.10 mg/dL   Calcium 8.6  8.4 - 10.5 mg/dL   GFR calc non Af Amer 85 (*) >90 mL/min   GFR calc Af Amer >90  >90 mL/min  GLUCOSE, CAPILLARY     Status: Abnormal   Collection Time   01/07/13  9:31 PM      Component Value Range   Glucose-Capillary 365 (*) 70 - 99 mg/dL  MRSA PCR SCREENING     Status: Normal   Collection Time   01/07/13  9:51 PM      Component Value Range   MRSA by PCR NEGATIVE  NEGATIVE  GLUCOSE, CAPILLARY     Status: Abnormal   Collection Time   01/07/13 10:42 PM      Component Value Range   Glucose-Capillary 237 (*) 70 - 99 mg/dL  BASIC METABOLIC PANEL     Status: Abnormal   Collection Time   01/07/13 11:30 PM      Component Value Range   Sodium 137  135 - 145 mEq/L   Potassium 3.3 (*) 3.5 - 5.1 mEq/L   Chloride 100  96 - 112 mEq/L   CO2 27  19 - 32 mEq/L   Glucose, Bld 186 (*) 70 - 99 mg/dL   BUN 8  6 - 23 mg/dL   Creatinine, Ser 0.88  0.50 - 1.10 mg/dL   Calcium 8.4  8.4 - 10.5 mg/dL   GFR calc non Af Amer 89 (*) >90 mL/min   GFR calc Af Amer >90  >90 mL/min  OSMOLALITY     Status: Normal   Collection Time   01/07/13 11:30 PM      Component Value Range   Osmolality 287  275 - 300 mOsm/kg  GLUCOSE, CAPILLARY     Status: Abnormal   Collection Time   01/07/13 11:57 PM      Component Value Range   Glucose-Capillary 207 (*) 70 - 99  mg/dL  GLUCOSE, CAPILLARY     Status: Abnormal   Collection Time   01/08/13 12:58 AM      Component Value Range   Glucose-Capillary 149 (*) 70 - 99 mg/dL  BASIC METABOLIC PANEL     Status: Abnormal   Collection Time   01/08/13  1:30 AM  Component Value Range   Sodium 138  135 - 145 mEq/L   Potassium 3.6  3.5 - 5.1 mEq/L   Chloride 102  96 - 112 mEq/L   CO2 25  19 - 32 mEq/L   Glucose, Bld 146 (*) 70 - 99 mg/dL   BUN 8  6 - 23 mg/dL   Creatinine, Ser 0.82  0.50 - 1.10 mg/dL   Calcium 8.5  8.4 - 10.5 mg/dL   GFR calc non Af Amer >90  >90 mL/min   GFR calc Af Amer >90  >90 mL/min  CBC     Status: Abnormal   Collection Time   01/08/13  1:30 AM      Component Value Range   WBC 5.4  4.0 - 10.5 K/uL   RBC 3.25 (*) 3.87 - 5.11 MIL/uL   Hemoglobin 8.9 (*) 12.0 - 15.0 g/dL   HCT 26.8 (*) 36.0 - 46.0 %   MCV 82.5  78.0 - 100.0 fL   MCH 27.4  26.0 - 34.0 pg   MCHC 33.2  30.0 - 36.0 g/dL   RDW 12.5  11.5 - 15.5 %   Platelets 155  150 - 400 K/uL  TROPONIN I     Status: Normal   Collection Time   01/08/13  1:30 AM      Component Value Range   Troponin I <0.30  <0.30 ng/mL  GLUCOSE, CAPILLARY     Status: Abnormal   Collection Time   01/08/13  1:53 AM      Component Value Range   Glucose-Capillary 137 (*) 70 - 99 mg/dL  GLUCOSE, CAPILLARY     Status: Abnormal   Collection Time   01/08/13  3:01 AM      Component Value Range   Glucose-Capillary 141 (*) 70 - 99 mg/dL  GLUCOSE, CAPILLARY     Status: Abnormal   Collection Time   01/08/13  4:16 AM      Component Value Range   Glucose-Capillary 140 (*) 70 - 99 mg/dL  GLUCOSE, CAPILLARY     Status: Abnormal   Collection Time   01/08/13  5:23 AM      Component Value Range   Glucose-Capillary 108 (*) 70 - 99 mg/dL  GLUCOSE, CAPILLARY     Status: Abnormal   Collection Time   01/08/13  6:24 AM      Component Value Range   Glucose-Capillary 167 (*) 70 - 99 mg/dL  GLUCOSE, CAPILLARY     Status: Abnormal   Collection Time   01/08/13  7:28  AM      Component Value Range   Glucose-Capillary 217 (*) 70 - 99 mg/dL   Comment 1 Notify RN    TROPONIN I     Status: Normal   Collection Time   01/08/13  8:13 AM      Component Value Range   Troponin I <0.30  <0.30 ng/mL  BASIC METABOLIC PANEL     Status: Abnormal   Collection Time   01/08/13  8:17 AM      Component Value Range   Sodium 136  135 - 145 mEq/L   Potassium 4.1  3.5 - 5.1 mEq/L   Chloride 103  96 - 112 mEq/L   CO2 23  19 - 32 mEq/L   Glucose, Bld 226 (*) 70 - 99 mg/dL   BUN 8  6 - 23 mg/dL   Creatinine, Ser 0.80  0.50 - 1.10 mg/dL   Calcium 8.4  8.4 -  10.5 mg/dL   GFR calc non Af Amer >90  >90 mL/min   GFR calc Af Amer >90  >90 mL/min  MAGNESIUM     Status: Normal   Collection Time   01/08/13  8:17 AM      Component Value Range   Magnesium 1.5  1.5 - 2.5 mg/dL  GLUCOSE, CAPILLARY     Status: Abnormal   Collection Time   01/08/13  8:28 AM      Component Value Range   Glucose-Capillary 197 (*) 70 - 99 mg/dL  GLUCOSE, CAPILLARY     Status: Abnormal   Collection Time   01/08/13 11:13 AM      Component Value Range   Glucose-Capillary 160 (*) 70 - 99 mg/dL  BASIC METABOLIC PANEL     Status: Abnormal   Collection Time   01/08/13 11:23 AM      Component Value Range   Sodium 137  135 - 145 mEq/L   Potassium 4.4  3.5 - 5.1 mEq/L   Chloride 102  96 - 112 mEq/L   CO2 25  19 - 32 mEq/L   Glucose, Bld 192 (*) 70 - 99 mg/dL   BUN 7  6 - 23 mg/dL   Creatinine, Ser 0.80  0.50 - 1.10 mg/dL   Calcium 8.7  8.4 - 10.5 mg/dL   GFR calc non Af Amer >90  >90 mL/min   GFR calc Af Amer >90  >90 mL/min    Imaging results:  Dg Chest 2 View  01/07/2013  *RADIOLOGY REPORT*  Clinical Data: Cough and chest congestion.  Nausea and vomiting. Diabetes and hypertension.  Asthma.  CHEST - 2 VIEW  Comparison:  04/25/2012  Findings:  The heart size and mediastinal contours are within normal limits.  Both lungs are clear.  The visualized skeletal structures are unremarkable.  IMPRESSION: No  active cardiopulmonary disease.   Original Report Authenticated By: Earle Gell, M.D.      Assessment and Plan: I agree with the formulated Assessment and Plan with the following changes:  27 year old female with a past medical history significant for type 1 diabetes mellitus, hypothyroidism, GERD, presented with signs and symptoms of viral gastroenteritis, uncontrolled diabetes mellitus, and possibly bacterial conjunctivitis of the left eye. #1 uncontrolled diabetes mellitus: There are no ketones in her urine and her osmolality was not significantly elevated so this is likely in the spectrum between DKA and HHS. The patient has received enough IV fluids and is now euvolemic. She should be seen by a diabetes educator and adherence to her insulin must be reinforced. She admits she does at times skip doses of insulin. Her hemoglobin A1c is 14%. Diet can be advanced and she can be discharged if tolerates her diet. This is likely a result of gastroenteritis which is resolving. She is DKA due to continued use of insulin throughout her illness. I do not currently suspect pneumonia or another source of infection. Her symptoms may have been preceded by an upper respiratory infection. #2 bacterial conjunctivitis: I would treat this with Cipro eyedrops for entire course but would not add steroids to the drops. At this time my suspicion for preseptal cellulitis is low. He should be monitored for any signs of worsening and the patient was educated on this. Please refer to resident note for further problems.  Dominic Pea, DO 1/21/20141:53 PM

## 2013-01-08 NOTE — Progress Notes (Signed)
Utilization Review Completed.  01/08/2013

## 2013-01-09 ENCOUNTER — Telehealth: Payer: Self-pay | Admitting: Dietician

## 2013-01-09 DIAGNOSIS — E109 Type 1 diabetes mellitus without complications: Secondary | ICD-10-CM

## 2013-01-09 NOTE — Telephone Encounter (Signed)
Discharge date:01-08-13 Call date: 01-09-13 Hospital follow up appointment date: 01/16/13 at 11:30 AM & 1:15 PM  Calling to assist with transition of care from hospital to home.  Discharge medications reviewed:  Able to fill all prescriptions?  Patient aware of hospital follow up appointments.   No problems with transportation.  Other problems/concerns:

## 2013-01-10 NOTE — Telephone Encounter (Signed)
Left 2 messages for pt. Await return call to assist her with her transition from hospital to home and encourage hospital follow up attendance.

## 2013-01-16 ENCOUNTER — Encounter: Payer: Self-pay | Admitting: Internal Medicine

## 2013-01-16 ENCOUNTER — Ambulatory Visit: Payer: Self-pay | Admitting: Dietician

## 2013-01-22 ENCOUNTER — Telehealth: Payer: Self-pay | Admitting: Dietician

## 2013-01-22 NOTE — Telephone Encounter (Signed)
Left message requesting patient call to reschedule appointments

## 2013-02-06 ENCOUNTER — Other Ambulatory Visit: Payer: Self-pay | Admitting: *Deleted

## 2013-02-06 DIAGNOSIS — E109 Type 1 diabetes mellitus without complications: Secondary | ICD-10-CM

## 2013-02-06 DIAGNOSIS — E890 Postprocedural hypothyroidism: Secondary | ICD-10-CM

## 2013-02-07 MED ORDER — LEVOTHYROXINE SODIUM 100 MCG PO TABS: 100.0000 ug | ORAL_TABLET | Freq: Every day | ORAL | Status: DC

## 2013-02-07 NOTE — Telephone Encounter (Signed)
Rx faxed in.

## 2013-02-10 NOTE — Discharge Summary (Signed)
Patient Name:  Angela [name] MRN: [id number]  PCP: Neta Ehlers, MD DOB:  1986-10-05       Date of Admission:  01/07/2013  Date of Discharge:  01/08/2013     Attending Physician: Dr. Murlean Caller         DISCHARGE DIAGNOSES: Type 1 Diabetes Mellitus Viral gastroenteritis Conjunctivitis Hypothyroidism GERD   DISPOSITION AND FOLLOW-UP: Angela [name] is to follow-up with the listed providers as detailed below, at which time, the following should be addressed:   1. F/u on resolution of conjunctivitis 2. Labs / imaging needed: CBG 3. Pending labs/ test needing follow-up: N/A  Follow-up Information   Call Santa Lighter, MD. (Scheduled for 1:15 AM on Wednesday, Jan 29th)    Contact information:   [address] [place] [address] [phone]       Follow up with Plyler, Butch Penny, RD. (Scheduled for 11:30 PM on Wednesday, Jan 29th)    Contact information:   [place] [address] [phone]      Discharge Orders   Future Orders Complete By Expires     Call MD for:  persistant nausea and vomiting  As directed     Diet Carb Modified  As directed     Increase activity slowly  As directed         DISCHARGE MEDICATIONS:   Medication List    STOP taking these medications       TYLENOL PM EXTRA STRENGTH PO      TAKE these medications       albuterol 108 (90 BASE) MCG/ACT inhaler  Commonly known as:  PROVENTIL HFA;VENTOLIN HFA  Inhale 1-2 puffs into the lungs every 6 (six) hours as needed for wheezing.     ciprofloxacin 0.3 % ophthalmic solution  Commonly known as:  CILOXAN  Place 1 drop into the left eye every 2 (two) hours. Administer 1 drop, every 2 hours, while awake, for 3 days. Then stop.     esomeprazole 20 MG capsule  Commonly known as:  NEXIUM  Take 1 capsule (20 mg total) by mouth daily before breakfast.     ibuprofen 200 MG tablet  Commonly known as:  ADVIL,MOTRIN  Take 400 mg by mouth daily as needed. For  pain     insulin aspart 100 UNIT/ML injection  Commonly known as:  novoLOG  Inject 4-12 Units into the skin 3 (three) times daily before meals. Based on sliding scale - pt has chart at home     insulin glargine 100 UNIT/ML injection  Commonly known as:  LANTUS  Inject 35 Units into the skin at bedtime.     lisinopril 5 MG tablet  Commonly known as:  PRINIVIL,ZESTRIL  Take 1 tablet (5 mg total) by mouth daily.     pravastatin 20 MG tablet  Commonly known as:  PRAVACHOL  Take 1 tablet (20 mg total) by mouth every evening.         CONSULTS:       PROCEDURES PERFORMED:  No results found.     ADMISSION DATA: H&P: Patient is a 27 y.o. female with a PMHx of Type 1 DM, hypothyroidism, HTN, who presents to [facility] for evaluation of nausea, vomiting, and diarrhea. Pt states that these symptoms began approximately 1 week ago and have improved slightly since then. She states that has continued to use her insulin despite not being able to maintain much of her PO intake. She also complains of productive  cough x 2 weeks, but denies any SOB. She complains of occasional midsternal CP that is stabbing and sometimes pressure-like in quality, but states these symptoms have persisted for years and are not worsening. She denies any dysuria or hematuria. She states she has received the influenza vaccine this year, and denies any recent fevers or myalgias (although she admits to chills). Patient's only other complaints are coryza-like symptoms and L eye conjunctivits which have been present for approximately 1 week.  Regarding her home insulin, she uses lantus 35U qhs and sliding scale insulin (novolog flexpen 4-12U tid with meals). She states she has been using increased SSI doses since she began feeling ill ~1 wk prior to admission.    Physical Exam: General:  Vital signs reviewed and noted. Well-developed, well-nourished, in no acute distress; alert, appropriate and cooperative throughout examination.    Head:  Normocephalic, atraumatic.  Eyes:  PERRL, EOMI. L eye conjunctival injection/erythema  Nose:  Mucous membranes moist, not inflammed, nonerythematous.  Throat:  Oropharynx nonerythematous, no exudate appreciated.  Neck:  No deformities, masses, or tenderness noted.  Lungs:  Normal respiratory effort. Clear to auscultation BL without crackles or wheezes.  Heart:  RRR. S1 and S2 normal. S4 present.  Abdomen:  BS normoactive. Soft, Nondistended, non-tender. No masses or organomegaly.  Extremities:  No pretibial edema.  Neurologic:  A&O X3, CN II - XII are grossly intact. Motor strength is 5/5 in the all 4 extremities, Sensations intact to light touch, Cerebellar signs negative.  Skin:  Multiple tattoos.    Labs: Comprehensive Metabolic Panel:    Component  Value  Date/Time    NA  123*  01/07/2013 1542    K  5.4*  01/07/2013 1542    CL  88*  01/07/2013 1542    CO2  26  01/07/2013 1542    BUN  9  01/07/2013 1542    CREATININE  1.04  01/07/2013 1542    CREATININE  0.95  07/18/2012 1213    GLUCOSE  883*  01/07/2013 1542    CALCIUM  9.2  01/07/2013 1542    AST  15  01/07/2013 1542    ALT  11  01/07/2013 1542    ALKPHOS  91  01/07/2013 1542    BILITOT  0.5  01/07/2013 1542    PROT  7.0  01/07/2013 1542    ALBUMIN  3.4*  01/07/2013 1542    CBC:    Component  Value  Date/Time    WBC  4.1  01/07/2013 1542    HGB  10.2*  01/07/2013 1542    HCT  31.6*  01/07/2013 1542    PLT  170  01/07/2013 1542    MCV  85.6  01/07/2013 1542    NEUTROABS  2.6  01/07/2013 1542    LYMPHSABS  1.2  01/07/2013 1542    MONOABS  0.2  01/07/2013 1542    EOSABS  0.1  01/07/2013 1542    BASOSABS  0.0  01/07/2013 1542    Urinalysis:   Basename  01/07/13 1557   COLORURINE  YELLOW   LABSPEC  1.024   PHURINE  7.0   GLUCOSEU  >1000*   HGBUR  SMALL*   BILIRUBINUR  NEGATIVE   KETONESUR  NEGATIVE   PROTEINUR  NEGATIVE   UROBILINOGEN  0.2   NITRITE  NEGATIVE   LEUKOCYTESUR  NEGATIVE    Historical Labs:  Lab  Results   Component  Value  Date    HGBA1C  13.4  11/01/2012    Lab Results   Component  Value  Date    CHOL  221*  11/01/2012    HDL  78  11/01/2012    LDLCALC  118*  11/01/2012    TRIG  127  11/01/2012    CHOLHDL  2.8  11/01/2012    Lab Results   Component  Value  Date    TSH  12.692*  11/01/2012      HOSPITAL COURSE: Type 1 Diabetes Mellitus - patient has documented evidence of previous episodes of DKA, with likely noncompliance at those times. As her compliance with insulin therapy appears to be improving, this may explain why she did not present with DKA but with only hyperglycemia in the setting of her recent viral illness (she had increased her insulin dose while sick). She also proved to not have HHS, as she had no mental status changes and no osmolal gap. Her most recent A1c = 13.4 in 10/2012, however, which although is improved over an A1c > 14 in 06/2012, shows she still has considerable hyperglycemia at baseline. The importance of full compliance with her home insulin regimen was stressed prior to discharge.   Viral gastroenteritis - symptoms were mild on admission and were resolved at time of discharge. She received only PRN zofran for this issue during her course.   Conjunctivitis - likely secondary to the same viral process causing the patient's gastroenteritis, per above. However as the patient stated during her course that eye discharge was sometimes purulent and green in color, she was prescribed ciprofloxacin eyedrops at time of discharge.   Hypothyroidism - TSH = 12.692 on 10/30/2012. It appears pt's compliance with synthroid has been questionable in the past, however her TSH has shown marked improvement since 06/2012 when it was ~56. Synthroid was continued at 143mcg QD during hospital course .  GERD - stable. On nexium at home. Patient's complaints of a history of intermittent chest pain for the last few years are most likely GERD-related (to note, denies CP on  admission). Was given protonix 40mg  QD during course, and resumed nexium at dischage.    DISCHARGE DATA: Vital Signs: BP 124/83  Pulse 94  Temp(Src) 98 F (36.7 C) (Oral)  Resp 19  Ht 5\' 4"  (1.626 m)  Wt 164 lb 11.2 oz (74.707 kg)  BMI 28.26 kg/m2  SpO2 100%  LMP 12/24/2012  Labs: No results found for this or any previous visit (from the past 24 hour(s)).   Time spent on discharge: 20 minutes  Services Ordered on Discharge: Y = Yes; Blank = No PT:   OT:   RN:   Equipment:   Other:     Signed: Gae Gallop, MD   PGY 1, Internal Medicine Resident 02/10/2013, 6:28 PM

## 2013-04-01 ENCOUNTER — Encounter (HOSPITAL_COMMUNITY): Payer: Self-pay | Admitting: *Deleted

## 2013-04-01 ENCOUNTER — Emergency Department (HOSPITAL_COMMUNITY)
Admission: EM | Admit: 2013-04-01 | Discharge: 2013-04-01 | Discharge status: Against Medical Advice | Payer: Self-pay | Attending: Emergency Medicine | Admitting: Emergency Medicine

## 2013-04-01 ENCOUNTER — Other Ambulatory Visit: Payer: Self-pay

## 2013-04-01 DIAGNOSIS — R109 Unspecified abdominal pain: Secondary | ICD-10-CM | POA: Insufficient documentation

## 2013-04-01 MED ORDER — ONDANSETRON 4 MG PO TBDP
8.0000 mg | ORAL_TABLET | Freq: Once | ORAL | Status: AC
  Administered 2013-04-01: 8 mg via ORAL
  Filled 2013-04-01: qty 2

## 2013-04-01 NOTE — ED Notes (Signed)
Pt called X3 in waiting room with no response

## 2013-04-01 NOTE — ED Notes (Signed)
Pt.having abdominal pain with n/v/d for over 3 weeks also having Chest pain, dizziness for 3 weeks.

## 2013-04-01 NOTE — ED Notes (Signed)
Pt. Triaged by Bjorn Loser

## 2013-04-22 ENCOUNTER — Ambulatory Visit (INDEPENDENT_AMBULATORY_CARE_PROVIDER_SITE_OTHER): Payer: Self-pay | Admitting: Internal Medicine

## 2013-04-22 ENCOUNTER — Encounter: Payer: Self-pay | Admitting: Internal Medicine

## 2013-04-22 ENCOUNTER — Ambulatory Visit: Payer: Self-pay

## 2013-04-22 VITALS — BP 158/102 | HR 105 | Temp 97.4°F | Ht 64.0 in | Wt 165.8 lb

## 2013-04-22 DIAGNOSIS — F329 Major depressive disorder, single episode, unspecified: Secondary | ICD-10-CM

## 2013-04-22 DIAGNOSIS — E785 Hyperlipidemia, unspecified: Secondary | ICD-10-CM

## 2013-04-22 DIAGNOSIS — I10 Essential (primary) hypertension: Secondary | ICD-10-CM

## 2013-04-22 DIAGNOSIS — E109 Type 1 diabetes mellitus without complications: Secondary | ICD-10-CM

## 2013-04-22 DIAGNOSIS — E890 Postprocedural hypothyroidism: Secondary | ICD-10-CM

## 2013-04-22 LAB — GLUCOSE, CAPILLARY: Glucose-Capillary: 83 mg/dL (ref 70–99)

## 2013-04-22 LAB — TSH: TSH: 34.002 u[IU]/mL — ABNORMAL HIGH (ref 0.350–4.500)

## 2013-04-22 MED ORDER — LOVASTATIN 20 MG PO TABS: 20.0000 mg | ORAL_TABLET | Freq: Every day | ORAL | Status: DC

## 2013-04-22 MED ORDER — GABAPENTIN 100 MG PO CAPS: 100.0000 mg | ORAL_CAPSULE | Freq: Three times a day (TID) | ORAL | Status: DC

## 2013-04-22 MED ORDER — CITALOPRAM HYDROBROMIDE 20 MG PO TABS: 20.0000 mg | ORAL_TABLET | Freq: Every day | ORAL | Status: DC

## 2013-04-22 MED ORDER — LISINOPRIL 10 MG PO TABS: 10.0000 mg | ORAL_TABLET | Freq: Every day | ORAL | Status: DC

## 2013-04-22 MED ORDER — LISINOPRIL 5 MG PO TABS: 10.0000 mg | ORAL_TABLET | Freq: Every day | ORAL | Status: DC

## 2013-04-22 NOTE — Assessment & Plan Note (Addendum)
Lab Results  Component Value Date   HGBA1C 10.5 04/22/2013   HGBA1C 14.0* 01/07/2013   HGBA1C 13.4 11/01/2012     Assessment: Diabetes control: poor control (HgbA1C >9%) Progress toward A1C goal:  improved Comments:   Plan: Medications:  see below Home glucose monitoring: Frequency:   Timing:   Instruction/counseling given: reminded to bring blood glucose meter & log to each visit, discussed foot care and discussed diet Educational resources provided: brochure Self management tools provided: home glucose logbook Other plans:   Patient states she is doing better with her insulin therapy and is taking her Lantus on a daily basis, but she is only taking 30 units rather than 35. She has a long history of noncompliance with medications. She currently only checks her blood sugar to 3 times per week. Patient is now having symptoms of gastroparesis and peripheral neuropathy, both of which are known complications of poorly controlled diabetes. We did discuss this in detail and patient voiced understanding of the importance of glucose control.  -Increase Lantus to 35 units, continue NovoLog -Reminded patient to check her sugar at least every morning before breakfast -Reminded her to bring her meter to each visit -Start gabapentin 100 mg 3 times daily for peripheral neuropathy -Patient will call back in one to 2 weeks to let us know how this medication worked

## 2013-04-22 NOTE — Assessment & Plan Note (Signed)
BP Readings from Last 3 Encounters:  04/22/13 158/102  04/01/13 145/90  01/08/13 124/83    Lab Results  Component Value Date   NA 137 01/08/2013   K 4.4 01/08/2013   CREATININE 0.80 01/08/2013    Assessment: Blood pressure control: moderately elevated Progress toward BP goal:  deteriorated Comments:   Plan: Medications:  See below Educational resources provided: brochure;video Self management tools provided: home blood pressure logbook Other plans:   Patient states that she has not been taking her lisinopril in several months. She is also complaining of hearing her heartbeat in her ear, which is likely from uncontrolled hypertension. We discussed the importance of taking lisinopril not only for her blood pressure but also for her kidneys.  -New prescription for lisinopril 10 mg sent to [facility] (patient was previously on 5 mg) -Recheck in one month, may need to add another agent but I am hesitant to do this as patient is not compliant with just one agent

## 2013-04-22 NOTE — Assessment & Plan Note (Signed)
Patient is not taking her statin. We will send in a prescription to her pharmacy but as finances are limited and her orange card has expired, we will switch her to lovastatin as this is on the $4 list at [facility]  -Lovastatin 20 mg daily
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
## 2013-04-22 NOTE — Assessment & Plan Note (Signed)
Patient has not been taking her Synthroid in at least the past 2 weeks. Her TSH has been elevated in the past and I expect it will be elevated today. We discussed taking his medication regularly every day and we can recheck her TSH next visit to show her that it improves when she is compliant.   -Continue daily Synthroid

## 2013-04-22 NOTE — Progress Notes (Signed)
I have discussed this case with Dr. Sissy Hoff, read the documentation and I agree with the plan of care. Please see the resident note for details of management.

## 2013-04-22 NOTE — Patient Instructions (Addendum)
Start these new medications: -Celexa (start with 1/2 tab daily for one week, then full 20mg  tablet) -Gabapentin - this is for neuropathy in your feet. Take 100mg  three times per day and call me in 1-2 weeks to let me know how it is working. We can increase the dose if needed. -Lisinopril 10mg : sent to [facility] -Lovastatin: for cholesterol, sent to [facility]  Take synthroid every day before breakfast!  Increase lantus to 35 units and check your blood sugars at least every morning.  Thanks for coming in today!!!   Return to clinic in 1 month, sooner if needed.

## 2013-04-24 NOTE — Progress Notes (Signed)
I discussed this case with Dr. Sissy Hoff soon after she saw the patient. I have read the documentation and I agree with the plan of care. Please see the resident note for details of management.

## 2013-05-23 ENCOUNTER — Ambulatory Visit: Payer: Self-pay | Admitting: Internal Medicine

## 2013-05-30 ENCOUNTER — Ambulatory Visit: Payer: Self-pay | Admitting: Internal Medicine

## 2013-06-06 ENCOUNTER — Encounter: Payer: Self-pay | Admitting: Internal Medicine

## 2013-06-06 ENCOUNTER — Ambulatory Visit (INDEPENDENT_AMBULATORY_CARE_PROVIDER_SITE_OTHER): Payer: Self-pay | Admitting: Internal Medicine

## 2013-06-06 VITALS — BP 150/103 | HR 95 | Temp 98.0°F | Ht 65.5 in | Wt 158.9 lb

## 2013-06-06 DIAGNOSIS — E1059 Type 1 diabetes mellitus with other circulatory complications: Secondary | ICD-10-CM

## 2013-06-06 DIAGNOSIS — I10 Essential (primary) hypertension: Secondary | ICD-10-CM

## 2013-06-06 MED ORDER — METOCLOPRAMIDE HCL 10 MG PO TABS: 10.0000 mg | ORAL_TABLET | Freq: Three times a day (TID) | ORAL | Status: DC

## 2013-06-06 NOTE — Assessment & Plan Note (Signed)
BP Readings from Last 3 Encounters:  06/06/13 150/103  04/22/13 158/102  04/01/13 145/90    Lab Results  Component Value Date   NA 137 01/08/2013   K 4.4 01/08/2013   CREATININE 0.80 01/08/2013    Assessment: Blood pressure control: mildly elevated Progress toward BP goal:  unchanged Comments: The patient's BP remains borderline elevated today.  Her diabetes is the more pressing issue at this time.  To encourage compliance with CBG monitoring, we will avoid changing her anti-HTN medications today.  Plan: Medications:  continue current medications Educational resources provided:   Self management tools provided:   Other plans: Recheck at future visits

## 2013-06-06 NOTE — Progress Notes (Signed)
HPI The patient is a 27 y.o. female with a history of postablative hypothyroidism, DM1, HL, tobacco abuse, HTN, presenting for a follow-up visit.  The patient is accompanied by her partner.  The patient has a history of DM1.  She was last seen 5/5, at which time she was noted to have non-compliance with CBG monitoring and insulin usage.  Today, she notes that she still has not been checking her CBG's, initially stating it was because she couldn't locate her glucometer, but later admitted that she knew where it was.  She does note full compliance with insulin, taking taking Lantus 30 qhs, as well as novolog, 5 units twice per day.  The patient had initially changed Lantus to 35 units as instructed at her last appointment, but noted nightly hypoglycemia, with symptoms of diaphoresis, AMS (not confirmed by glucometer), prompting re-lowering of Lantus to 30.  The patient only uses Novolog twice per day because she only eats 2 meals/day (lunch, dinner)  The patient continues to note feelings of nausea, with vomiting most days, once per morning.  She states that she becomes "full" after eating only small meals.  This has been going on for 3 months.  The patient has adjusted by skipping breakfast, and not eating much during the day.  Dinner is her biggest meal.  She notes no constipation  ROS: General: no fevers, chills, changes in weight, changes in appetite Skin: no rash HEENT: no blurry vision, hearing changes, sore throat Pulm: no dyspnea, coughing, wheezing CV: no chest pain, palpitations, shortness of breath Abd: see HPI GU: no dysuria, hematuria, polyuria Ext: no arthralgias, myalgias Neuro: no weakness, numbness, or tingling  Filed Vitals:   06/06/13 0935  BP: 150/103  Pulse: 95  Temp: 98 F (36.7 C)    PEX General: alert, cooperative, and in no apparent distress HEENT: pupils equal round and reactive to light, vision grossly intact, oropharynx clear and non-erythematous  Neck: supple,  no lymphadenopathy Lungs: clear to ascultation bilaterally, normal work of respiration, no wheezes, rales, ronchi Heart: regular rate and rhythm, no murmurs, gallops, or rubs Abdomen: soft, mildly tender to epigastric palpation, non-distended, no guarding or rebound tenderness, very hypoactive bowel sounds Extremities: no cyanosis, clubbing, or edema Neurologic: alert & oriented X3, cranial nerves II-XII intact, strength grossly intact, sensation intact to light touch  Current Outpatient Prescriptions on File Prior to Visit  Medication Sig Dispense Refill  . albuterol (PROVENTIL HFA;VENTOLIN HFA) 108 (90 BASE) MCG/ACT inhaler Inhale 1-2 puffs into the lungs every 6 (six) hours as needed for wheezing.  1 Inhaler  10  . citalopram (CELEXA) 20 MG tablet Take 1 tablet (20 mg total) by mouth daily.  30 tablet  6  . esomeprazole (NEXIUM) 20 MG capsule Take 1 capsule (20 mg total) by mouth daily before breakfast.  90 capsule  3  . gabapentin (NEURONTIN) 100 MG capsule Take 1 capsule (100 mg total) by mouth 3 (three) times daily.  90 capsule  6  . ibuprofen (ADVIL,MOTRIN) 200 MG tablet Take 400 mg by mouth daily as needed. For pain      . insulin aspart (NOVOLOG) 100 UNIT/ML injection Inject 4-12 Units into the skin 3 (three) times daily before meals. Based on sliding scale - pt has chart at home      . insulin glargine (LANTUS) 100 UNIT/ML injection Inject 35 Units into the skin at bedtime.  15 pen  3  . levothyroxine (SYNTHROID, LEVOTHROID) 100 MCG tablet Take 1 tablet (100 mcg  total) by mouth daily before breakfast.  30 tablet  5  . lisinopril (PRINIVIL,ZESTRIL) 10 MG tablet Take 1 tablet (10 mg total) by mouth daily.  30 tablet  11  . lovastatin (MEVACOR) 20 MG tablet Take 1 tablet (20 mg total) by mouth at bedtime.  30 tablet  6   No current facility-administered medications on file prior to visit.    Assessment/Plan

## 2013-06-06 NOTE — Progress Notes (Signed)
Case discussed with Dr. Brown soon after the resident saw the patient.  We reviewed the resident's history and exam and pertinent patient test results.  I agree with the assessment, diagnosis, and plan of care documented in the resident's note. 

## 2013-06-06 NOTE — Patient Instructions (Addendum)
General Instructions: Your stomach pain is likely due to a condition called diabetic gastroparesis (see information below).  Ways to improve this include: 1. Keeping your blood sugar under control is the absolute best way to improve your symptoms now, and prevent them from worsening in the future 2. We are also prescribing a medication called Reglan.  Take 1 tablet, 3 times per day, about 30 minutes before each meal 3. Eating small meals, 3 times per day, rather than large meals twice per day  For your Diabetes, it is very important to check your blood sugar at least 3 times per day.  If you can do this, we can review your glucometer when you return and can help you adjust your insulin to avoid low blood sugars, but treat high blood sugars.  Please return for a follow-up visit in 1-3 weeks, preferably with me (Dr. Owens Shark).  I want you to be aware of the signs and symptoms of serotonin syndrome.  This is a rare condition, but can occur with the combination of Reglan and Celexa.  If you experience these symptoms, stop taking reglan, and call our office.   Treatment Goals:  Goals (1 Years of Data) as of 06/06/13   None      Progress Toward Treatment Goals:  Treatment Goal 06/06/2013  Hemoglobin A1C unchanged  Blood pressure unchanged  Stop smoking -    Self Care Goals & Plans:  Self Care Goal 04/22/2013  Manage my medications take my medicines as prescribed; refill my medications on time; follow the sick day instructions if I am sick  Monitor my health check my feet daily; bring my glucose meter and log to each visit; keep track of my blood glucose  Eat healthy foods eat baked foods instead of fried foods; eat foods that are low in salt  Be physically active find an activity I enjoy  Stop smoking go to the [employer] ([email or web address]    Home Blood Glucose Monitoring 06/06/2013  Check my blood sugar no home glucose monitoring  When to check my blood sugar N/A     Care  Management & Community Referrals:  Referral 06/06/2013  Referrals made for care management support none needed     Gastroparesis  Gastroparesis is also called slowed stomach emptying (delayed gastric emptying). It is a condition in which the stomach takes too long to empty its contents. It often happens in people with diabetes.  CAUSES  Gastroparesis happens when nerves to the stomach are damaged or stop working. When the nerves are damaged, the muscles of the stomach and intestines do not work normally. The movement of food is slowed or stopped. High blood glucose (sugar) causes changes in nerves and can damage the blood vessels that carry oxygen and nutrients to the nerves. RISK FACTORS  Diabetes.  Post-viral syndromes.  Eating disorders (anorexia, bulimia).  Surgery on the stomach or vagus nerve.  Gastroesophageal reflux disease (rarely).  Smooth muscle disorders (amyloidosis, scleroderma).  Metabolic disorders, including hypothyroidism.  Parkinson's disease. SYMPTOMS   Heartburn.  Feeling sick to your stomach (nausea).  Vomiting of undigested food.  An early feeling of fullness when eating.  Weight loss.  Abdominal bloating.  Erratic blood glucose levels.  Lack of appetite.  Gastroesophageal reflux.  Spasms of the stomach wall. Complications can include:  Bacterial overgrowth in stomach. Food stays in the stomach and can ferment and cause bacteria to grow.  Weight loss due to difficulty digesting and absorbing nutrients.  Vomiting.  Obstruction in  the stomach. Undigested food can harden and cause nausea and vomiting.  Blood glucose fluctuations caused by inconsistent food absorption. DIAGNOSIS  The diagnosis of gastroparesis is confirmed through one or more of the following tests:  Barium X-rays and scans. These tests look at how long it takes for food to move through the stomach.  Gastric manometry. This test measures electrical and muscular  activity in the stomach. A thin tube is passed down the throat into the stomach. The tube contains a wire that takes measurements of the stomach's electrical and muscular activity as it digests liquids and solid food.  Endoscopy. This procedure is done with a long, thin tube called an endoscope. It is passed through the mouth and gently guides down the esophagus into the stomach. This tube helps the caregiver look at the lining of the stomach to check for any abnormalities.  Ultrasound. This can rule out gallbladder disease or pancreatitis. This test will outline and define the shape of the gallbladder and pancreas. TREATMENT   The primary treatment is to identify the problem and help control blood glucose levels. Treatments include:  Exercise.  Medicines to control nausea and vomiting.  Medicines to stimulate stomach muscles.  Changes in what and when you eat.  Having smaller meals more often.  Eating low-fiber forms of high-fiber foods, such aseating cooked vegetables instead of raw vegetables.  Eating low-fat foods.  Consuming liquids, which are easier to digest.  In severe cases, feeding tubes and intravenous (IV) feeding may be needed. It is important to note that in most cases, treatment does not cure gastroparesis. It is usually a lasting (chronic) condition. Treatment helps you manage the condition so that you can be as healthy and comfortable as possible. NEW TREATMENTS  A gastric neurostimulator has been developed to assist people with gastroparesis. The battery-operated device is surgically implanted. It emits mild electrical pulses to help improve stomach emptying and to control nausea and vomiting.  The use of botulinum toxin has been shown to improve stomach emptying by decreasing the prolonged contractions of the muscle between the stomach and the small intestine (pyloric sphincter). The benefits are temporary. SEEK MEDICAL CARE IF:   You are having problems keeping  your blood glucose in goal range.  You are having nausea, vomiting, bloating, or early feelings of fullness with eating.  Your symptoms do not change with a change in diet. Document Released: 12/05/2005 Document Revised: 02/27/2012 Document Reviewed: 05/14/2009 [facility] Patient Information 2014 [place], [place].     Serotonin Syndrome Serotonin is a brain chemical that regulates the nervous system. Some kinds of drugs increase the amount of serotonin in your body. Drugs that increase the serotonin in your body include:   Anti-depressant medications.  St. John's wort.  Recreational drugs.  Migraine medicines.  Some pain medicines. SYMPTOMS Combining these drugs increases the risk that you will become ill with a toxic condition called serotonin syndrome.  Symptoms of too much serotonin include:  Confusion.  Agitation.  Weakness.  Insomnia.  Fever.  Sweats. Other symptoms that may develop include:  Shakiness.  Muscle spasms.  Seizures. TREATMENT  Hospital treatment is often needed until the effects are controlled.  Avoiding the combination of medicines listed above is recommended.  Check with your doctor if you are concerned about your medicine or the side effects. Document Released: 01/12/2005 Document Revised: 02/27/2012 Document Reviewed: 12/05/2005 [facility] Patient Information 2014 [place].

## 2013-06-06 NOTE — Assessment & Plan Note (Signed)
Lab Results  Component Value Date   HGBA1C 10.5 04/22/2013   HGBA1C 14.0* 01/07/2013   HGBA1C 13.4 11/01/2012     Assessment: Diabetes control: poor control (HgbA1C >9%) Progress toward A1C goal:  unchanged Comments: The patient's last A1C was 10.5.  CBG today is 329.  She has been taking her insulin, but has not been checking her CBG's at all.  She notes symptoms consistent with diabetic gastroparesis.  We spent > 20 minutes today in counseling and education, about diabetes as a disease process, the symptoms and pathophys of diabetic gastroparesis, and the importance of daily CBG monitoring.  The patient's A1C is significantly elevated, but increasing Lantus leads to hypoglycemia.  The patient will likely require increase in Novolog, but without CBG values, this is difficult to adjust.  We came up with the following plan.  The patient seems motivated to change, mostly prompted by symptoms of gastroparesis.  Plan: Medications:  The patient agrees to check her blood sugar 3 times per day for the next week, then to come back to clinic, and allow [place] to adjust her mealtime insulin regimen.  She seems open to the idea of a sliding scale (or correction factor.  Likely not yet ready for carb ratio) for mealtime insulin. Home glucose monitoring: Frequency: no home glucose monitoring Timing: N/A Instruction/counseling given: reminded to bring blood glucose meter & log to each visit Educational resources provided:   Self management tools provided:   Other plans: For her gastroparesis, we discussed how controlling blood sugars is the only way to prevent this problem from worsening.  She expressed understanding.  We will start a trial of reglan to see if this improves symptoms.  The patient would benefit from a gastric emptying study, which we can address at her next visit.

## 2013-06-13 ENCOUNTER — Other Ambulatory Visit: Payer: Self-pay | Admitting: Internal Medicine

## 2013-06-13 ENCOUNTER — Encounter (HOSPITAL_COMMUNITY): Payer: Self-pay | Admitting: General Practice

## 2013-06-13 ENCOUNTER — Inpatient Hospital Stay (HOSPITAL_COMMUNITY)
Admission: AD | Admit: 2013-06-13 | Discharge: 2013-06-18 | DRG: 690 | Disposition: A | Discharge status: Home / Self Care | Payer: MEDICAID | Source: Ambulatory Visit | Attending: Internal Medicine | Admitting: Internal Medicine

## 2013-06-13 ENCOUNTER — Other Ambulatory Visit: Payer: Self-pay

## 2013-06-13 ENCOUNTER — Ambulatory Visit (INDEPENDENT_AMBULATORY_CARE_PROVIDER_SITE_OTHER): Payer: Self-pay | Admitting: Internal Medicine

## 2013-06-13 ENCOUNTER — Encounter (HOSPITAL_COMMUNITY): Payer: Self-pay | Admitting: Internal Medicine

## 2013-06-13 ENCOUNTER — Encounter: Payer: Self-pay | Admitting: Internal Medicine

## 2013-06-13 VITALS — BP 145/105 | HR 99 | Temp 98.2°F | Ht 66.0 in | Wt 160.1 lb

## 2013-06-13 DIAGNOSIS — N10 Acute tubulo-interstitial nephritis: Secondary | ICD-10-CM | POA: Insufficient documentation

## 2013-06-13 DIAGNOSIS — E162 Hypoglycemia, unspecified: Secondary | ICD-10-CM

## 2013-06-13 DIAGNOSIS — R109 Unspecified abdominal pain: Secondary | ICD-10-CM

## 2013-06-13 DIAGNOSIS — E1049 Type 1 diabetes mellitus with other diabetic neurological complication: Secondary | ICD-10-CM | POA: Diagnosis present

## 2013-06-13 DIAGNOSIS — E1065 Type 1 diabetes mellitus with hyperglycemia: Secondary | ICD-10-CM | POA: Diagnosis present

## 2013-06-13 DIAGNOSIS — IMO0002 Reserved for concepts with insufficient information to code with codable children: Secondary | ICD-10-CM | POA: Diagnosis present

## 2013-06-13 DIAGNOSIS — K219 Gastro-esophageal reflux disease without esophagitis: Secondary | ICD-10-CM

## 2013-06-13 DIAGNOSIS — I10 Essential (primary) hypertension: Secondary | ICD-10-CM

## 2013-06-13 DIAGNOSIS — E861 Hypovolemia: Secondary | ICD-10-CM | POA: Diagnosis present

## 2013-06-13 DIAGNOSIS — E1059 Type 1 diabetes mellitus with other circulatory complications: Secondary | ICD-10-CM

## 2013-06-13 DIAGNOSIS — K5900 Constipation, unspecified: Secondary | ICD-10-CM

## 2013-06-13 DIAGNOSIS — R1013 Epigastric pain: Secondary | ICD-10-CM

## 2013-06-13 DIAGNOSIS — Z720 Tobacco use: Secondary | ICD-10-CM

## 2013-06-13 DIAGNOSIS — IMO0001 Reserved for inherently not codable concepts without codable children: Secondary | ICD-10-CM | POA: Diagnosis present

## 2013-06-13 DIAGNOSIS — Z9119 Patient's noncompliance with other medical treatment and regimen: Secondary | ICD-10-CM

## 2013-06-13 DIAGNOSIS — D649 Anemia, unspecified: Secondary | ICD-10-CM

## 2013-06-13 DIAGNOSIS — Z91199 Patient's noncompliance with other medical treatment and regimen due to unspecified reason: Secondary | ICD-10-CM

## 2013-06-13 DIAGNOSIS — E10649 Type 1 diabetes mellitus with hypoglycemia without coma: Secondary | ICD-10-CM | POA: Diagnosis present

## 2013-06-13 DIAGNOSIS — E1159 Type 2 diabetes mellitus with other circulatory complications: Secondary | ICD-10-CM | POA: Diagnosis present

## 2013-06-13 DIAGNOSIS — E785 Hyperlipidemia, unspecified: Secondary | ICD-10-CM

## 2013-06-13 DIAGNOSIS — K3184 Gastroparesis: Secondary | ICD-10-CM | POA: Diagnosis present

## 2013-06-13 DIAGNOSIS — N179 Acute kidney failure, unspecified: Secondary | ICD-10-CM

## 2013-06-13 DIAGNOSIS — E890 Postprocedural hypothyroidism: Secondary | ICD-10-CM

## 2013-06-13 DIAGNOSIS — F329 Major depressive disorder, single episode, unspecified: Secondary | ICD-10-CM | POA: Diagnosis present

## 2013-06-13 DIAGNOSIS — E1029 Type 1 diabetes mellitus with other diabetic kidney complication: Secondary | ICD-10-CM | POA: Diagnosis present

## 2013-06-13 DIAGNOSIS — F3289 Other specified depressive episodes: Secondary | ICD-10-CM

## 2013-06-13 DIAGNOSIS — J45909 Unspecified asthma, uncomplicated: Secondary | ICD-10-CM | POA: Diagnosis present

## 2013-06-13 DIAGNOSIS — E039 Hypothyroidism, unspecified: Secondary | ICD-10-CM | POA: Diagnosis present

## 2013-06-13 HISTORY — DX: Myoneural disorder, unspecified: G70.9

## 2013-06-13 HISTORY — DX: Gastro-esophageal reflux disease without esophagitis: K21.9

## 2013-06-13 LAB — COMPLETE METABOLIC PANEL WITH GFR
ALT: 7 U/L (ref 0–35)
Alkaline Phosphatase: 73 U/L (ref 39–117)
CO2: 31 mEq/L (ref 19–32)
Potassium: 3.8 mEq/L (ref 3.5–5.3)
Sodium: 138 mEq/L (ref 135–145)
Total Bilirubin: 0.8 mg/dL (ref 0.3–1.2)
Total Protein: 7.6 g/dL (ref 6.0–8.3)

## 2013-06-13 LAB — GLUCOSE, CAPILLARY
Glucose-Capillary: 60 mg/dL — ABNORMAL LOW (ref 70–99)
Glucose-Capillary: 63 mg/dL — ABNORMAL LOW (ref 70–99)
Glucose-Capillary: 104 mg/dL — ABNORMAL HIGH (ref 70–99)
Glucose-Capillary: 159 mg/dL — ABNORMAL HIGH (ref 70–99)
Glucose-Capillary: 132 mg/dL — ABNORMAL HIGH (ref 70–99)
Glucose-Capillary: 111 mg/dL — ABNORMAL HIGH (ref 70–99)

## 2013-06-13 LAB — CBC WITH DIFFERENTIAL/PLATELET
Basophils Relative: 0 % (ref 0–1)
Eosinophils Absolute: 0 10*3/uL (ref 0.0–0.7)
Eosinophils Relative: 0 % (ref 0–5)
HCT: 30.7 % — ABNORMAL LOW (ref 36.0–46.0)
Hemoglobin: 10.3 g/dL — ABNORMAL LOW (ref 12.0–15.0)
MCH: 27.6 pg (ref 26.0–34.0)
MCHC: 33.6 g/dL (ref 30.0–36.0)
Monocytes Absolute: 0.5 10*3/uL (ref 0.1–1.0)
Monocytes Relative: 7 % (ref 3–12)

## 2013-06-13 LAB — RAPID URINE DRUG SCREEN, HOSP PERFORMED
Amphetamines: NOT DETECTED
Benzodiazepines: NOT DETECTED
Cocaine: NOT DETECTED
Opiates: POSITIVE — AB

## 2013-06-13 LAB — URINALYSIS, ROUTINE W REFLEX MICROSCOPIC
Bilirubin Urine: NEGATIVE
Glucose, UA: NEGATIVE mg/dL
Protein, ur: 100 mg/dL — AB

## 2013-06-13 LAB — URINALYSIS, MICROSCOPIC ONLY: Crystals: NONE SEEN

## 2013-06-13 MED ORDER — ENOXAPARIN SODIUM 40 MG/0.4ML ~~LOC~~ SOLN
40.0000 mg | Freq: Every day | SUBCUTANEOUS | Status: DC
  Administered 2013-06-13 – 2013-06-17 (×5): 40 mg via SUBCUTANEOUS
  Filled 2013-06-13 (×6): qty 0.4

## 2013-06-13 MED ORDER — MORPHINE SULFATE 2 MG/ML IJ SOLN
2.0000 mg | INTRAMUSCULAR | Status: DC | PRN
  Administered 2013-06-13 – 2013-06-14 (×3): 4 mg via INTRAVENOUS
  Filled 2013-06-13: qty 1
  Filled 2013-06-13 (×3): qty 2

## 2013-06-13 MED ORDER — LEVOTHYROXINE SODIUM 100 MCG PO TABS
100.0000 ug | ORAL_TABLET | Freq: Every day | ORAL | Status: DC
  Administered 2013-06-14 – 2013-06-18 (×5): 100 ug via ORAL
  Filled 2013-06-13 (×6): qty 1

## 2013-06-13 MED ORDER — INSULIN ASPART 100 UNIT/ML ~~LOC~~ SOLN
0.0000 [IU] | SUBCUTANEOUS | Status: DC
  Administered 2013-06-13: 2 [IU] via SUBCUTANEOUS
  Administered 2013-06-13: 3 [IU] via SUBCUTANEOUS

## 2013-06-13 MED ORDER — MORPHINE SULFATE 2 MG/ML IJ SOLN
INTRAMUSCULAR | Status: AC
  Administered 2013-06-13: 2 mg via INTRAVENOUS
  Filled 2013-06-13: qty 1

## 2013-06-13 MED ORDER — ONDANSETRON HCL 4 MG/2ML IJ SOLN
INTRAMUSCULAR | Status: AC
  Administered 2013-06-13: 4 mg
  Filled 2013-06-13: qty 2

## 2013-06-13 MED ORDER — DEXTROSE 5 % IV SOLN
1.0000 g | INTRAVENOUS | Status: DC
  Administered 2013-06-13 – 2013-06-15 (×3): 1 g via INTRAVENOUS
  Filled 2013-06-13 (×4): qty 10

## 2013-06-13 MED ORDER — PANTOPRAZOLE SODIUM 40 MG IV SOLR
40.0000 mg | INTRAVENOUS | Status: DC
  Administered 2013-06-13 – 2013-06-14 (×2): 40 mg via INTRAVENOUS
  Filled 2013-06-13 (×2): qty 40

## 2013-06-13 MED ORDER — DEXTROSE-NACL 5-0.9 % IV SOLN
INTRAVENOUS | Status: DC
  Administered 2013-06-13: 11:00:00 via INTRAVENOUS

## 2013-06-13 MED ORDER — INSULIN GLARGINE 100 UNIT/ML ~~LOC~~ SOLN
25.0000 [IU] | Freq: Every day | SUBCUTANEOUS | Status: DC
  Administered 2013-06-13: 25 [IU] via SUBCUTANEOUS
  Filled 2013-06-13 (×2): qty 0.25

## 2013-06-13 MED ORDER — ONDANSETRON HCL 4 MG PO TABS
4.0000 mg | ORAL_TABLET | Freq: Four times a day (QID) | ORAL | Status: DC | PRN
  Administered 2013-06-16: 4 mg via ORAL
  Filled 2013-06-13: qty 1

## 2013-06-13 MED ORDER — ONDANSETRON HCL 4 MG/2ML IJ SOLN
4.0000 mg | Freq: Four times a day (QID) | INTRAMUSCULAR | Status: DC | PRN
  Administered 2013-06-13 – 2013-06-15 (×7): 4 mg via INTRAVENOUS
  Filled 2013-06-13 (×7): qty 2

## 2013-06-13 MED ORDER — DEXTROSE-NACL 5-0.45 % IV SOLN
INTRAVENOUS | Status: DC
  Administered 2013-06-13 – 2013-06-14 (×3): via INTRAVENOUS

## 2013-06-13 NOTE — Progress Notes (Signed)
HPI The patient is a 27 y.o. female with a history of DM1, HTN, tobacco abuse, presenting for a 1-week follow-up.  The patient notes that since her last visit, she has had continued abdominal pain.  She has tried heating pads, tylenol, antacid, with no relief.  She describes the pain as epigastric, radiating to her LUQ and back, present for the last 3 days, associated with nausea and several episodes of vomiting.  She notes poor PO intake over the last few days, and has taken no PO medications for the last 3 days..  Tylenol did not relieve the pain.  The patient notes no fevers.  The patient's last bowel movement was 2 days ago, described as constipation.  The patient has been passing gas yesterday and today.  She notes no prior abdominal surgeries.  She notes no dysuria, but does note urinary frequency.  The patient was prescribed reglan, which she tried 4 days ago, but only tried one dose.  The patient has a history of DM1.  She notes that she has continued to take her insulin, 35 Lantus, 5 TID of novolog.  The patient was unable to find her glucometer, and has not been checking her blood sugars since that time.  She notes 1 episode of symptomatic hypoglycemia yesterday.  Her CBG was 60 in clinic today, though she notes no symptoms of diaphoresis, tremulousness, or AMS.  ROS: General: no fevers, chills, changes in weight, changes in appetite Skin: no rash HEENT: no blurry vision, hearing changes, sore throat Pulm: no dyspnea, coughing, wheezing CV: no chest pain, palpitations, shortness of breath Abd: see HPI GU: +urinary frequency Ext: no arthralgias, myalgias Neuro: no weakness, numbness, or tingling  Filed Vitals:   06/13/13 0912  BP: 145/105  Pulse: 99  Temp: 98.2 F (36.8 C)    PEX General: alert, appears significantly uncomfortable HEENT: pupils equal round and reactive to light, vision grossly intact, oropharynx clear and non-erythematous  Neck: supple, no  lymphadenopathy Lungs: clear to ascultation bilaterally, normal work of respiration, no wheezes, rales, ronchi Heart: regular rate and rhythm, no murmurs, gallops, or rubs Abdomen: soft, maximally tender to epigastric and LUQ palpation, though also mild tenderness throughout abdomen and bilateral CVA L > R.  No distension.  Hypoactive bowel sounds, no guarding or rebound tenderness Extremities: no cyanosis, clubbing, or edema Neurologic: alert & oriented X3, cranial nerves II-XII intact, strength grossly intact, sensation intact to light touch  Current Outpatient Prescriptions on File Prior to Visit  Medication Sig Dispense Refill  . albuterol (PROVENTIL HFA;VENTOLIN HFA) 108 (90 BASE) MCG/ACT inhaler Inhale 1-2 puffs into the lungs every 6 (six) hours as needed for wheezing.  1 Inhaler  10  . citalopram (CELEXA) 20 MG tablet Take 1 tablet (20 mg total) by mouth daily.  30 tablet  6  . esomeprazole (NEXIUM) 20 MG capsule Take 1 capsule (20 mg total) by mouth daily before breakfast.  90 capsule  3  . gabapentin (NEURONTIN) 100 MG capsule Take 1 capsule (100 mg total) by mouth 3 (three) times daily.  90 capsule  6  . ibuprofen (ADVIL,MOTRIN) 200 MG tablet Take 400 mg by mouth daily as needed. For pain      . insulin aspart (NOVOLOG) 100 UNIT/ML injection Inject 4-12 Units into the skin 3 (three) times daily before meals. Based on sliding scale - pt has chart at home      . insulin glargine (LANTUS) 100 UNIT/ML injection Inject 35 Units into the skin at  bedtime.  15 pen  3  . levothyroxine (SYNTHROID, LEVOTHROID) 100 MCG tablet Take 1 tablet (100 mcg total) by mouth daily before breakfast.  30 tablet  5  . lisinopril (PRINIVIL,ZESTRIL) 10 MG tablet Take 1 tablet (10 mg total) by mouth daily.  30 tablet  11  . lovastatin (MEVACOR) 20 MG tablet Take 1 tablet (20 mg total) by mouth at bedtime.  30 tablet  6  . metoCLOPramide (REGLAN) 10 MG tablet Take 1 tablet (10 mg total) by mouth 3 (three) times  daily with meals.  90 tablet  3   No current facility-administered medications on file prior to visit.    Assessment/Plan Will admit patient to the Internal Medicine Service, due to severe abdominal pain and inability to take PO.

## 2013-06-13 NOTE — Assessment & Plan Note (Signed)
The patient presents with a 3-day history of abdominal pain, epigastric and LUQ radiating to her back.  She has been unable to keep up with PO intake.  The differential is broad, but includes pancreatis vs pyelonephritis (nausea, L CVA tenderness, urinary frequency, though no dysuria).  The pain is less characteristic for, but could include cholecystitis vs nephrolithiasis vs diverticulitis vs diabetic gastroparesis.  Unlikely SBO, as patient is passing flatus, abd is not distended, and no prior abd surgeries. -check CMP, CBC, lipase -check UA, urine culture -admit -consider CT abd -will check orthostatic vitals today; pt will likely need IVF

## 2013-06-13 NOTE — Assessment & Plan Note (Signed)
BP Readings from Last 3 Encounters:  06/13/13 145/105  06/06/13 150/103  04/22/13 158/102    Lab Results  Component Value Date   NA 138 06/13/2013   K 3.8 06/13/2013   CREATININE 1.20* 06/13/2013    Assessment: Blood pressure control: mildly elevated Progress toward BP goal:  unchanged Comments: BP elevated, in setting of pain and medication non-compliance  Plan: Medications:  continue current medications Educational resources provided:   Self management tools provided:   Other plans: Admitting patient today.  Will check at next visit

## 2013-06-13 NOTE — H&P (Signed)
Date: 06/13/2013               Patient Name:  Angela [name] MRN: [id number]  DOB: 02/07/1986 Age / Sex: 27 y.o., female   PCP: Neta Ehlers, MD         Medical Service: Internal Medicine Teaching Service         Attending Physician: Dr. Madilyn Fireman, MD    First Contact: Dr. Jerene Pitch Pager: [phone]  Second Contact: Dr. Jannette Fogo Pager: [phone]       After Hours (After 5p/  First Contact Pager: [phone]  weekends / holidays): Second Contact Pager: [phone]   Chief Complaint: left flank and abdominal pain, nausea and vomiting  History of Present Illness: Angela [name] is a 27 year old woman with a PMH significant for type I Diabetes mellitus, hypothyroidism following ablation for toxic multinodular goiter, hypertension, hyperlipidemia, GERD, and depression who presented to the [facility] today with complaints of epigastric abdominal pain, left flank pain, nausea, and vomiting.  She states that the pain has been there for about a week and has gotten progressively worse.  She states that she has been nauseated and has been vomiting anything she has attempted to eat for the last 3 days.  She denies fevers, diarrhea, blood in the vomit, or blood in her stool.  She states that she has never had anything like this before.  She states that the pain seems to start in her left flank and move across to the epigastric area.  She states that any movement makes it worse.  The pain is a sharp stabbing pain.  She states that she hasn't been able to keep down her medications but has continued to take her insulin.  She also mentions that she feels like she is urinating more often but denies dysuria and urgency. She also states that she hasn't had a BM in over a week.    Meds: Current Outpatient Prescriptions  Medication Sig Dispense Refill  . albuterol (PROVENTIL HFA;VENTOLIN HFA) 108 (90 BASE) MCG/ACT inhaler Inhale 1-2 puffs into the lungs every 6 (six) hours as needed for wheezing.  1 Inhaler  10  .  citalopram (CELEXA) 20 MG tablet Take 1 tablet (20 mg total) by mouth daily.  30 tablet  6  . esomeprazole (NEXIUM) 20 MG capsule Take 1 capsule (20 mg total) by mouth daily before breakfast.  90 capsule  3  . gabapentin (NEURONTIN) 100 MG capsule Take 1 capsule (100 mg total) by mouth 3 (three) times daily.  90 capsule  6  . ibuprofen (ADVIL,MOTRIN) 200 MG tablet Take 400 mg by mouth daily as needed. For pain      . insulin aspart (NOVOLOG) 100 UNIT/ML injection Inject 4-12 Units into the skin 3 (three) times daily before meals. Based on sliding scale - pt has chart at home      . insulin glargine (LANTUS) 100 UNIT/ML injection Inject 35 Units into the skin at bedtime.  15 pen  3  . levothyroxine (SYNTHROID, LEVOTHROID) 100 MCG tablet Take 1 tablet (100 mcg total) by mouth daily before breakfast.  30 tablet  5  . lisinopril (PRINIVIL,ZESTRIL) 10 MG tablet Take 1 tablet (10 mg total) by mouth daily.  30 tablet  11  . lovastatin (MEVACOR) 20 MG tablet Take 1 tablet (20 mg total) by mouth at bedtime.  30 tablet  6  . metoCLOPramide (REGLAN) 10 MG tablet Take 1 tablet (10 mg total) by mouth 3 (three) times  daily with meals.  90 tablet  3   Allergies: Allergies as of 06/13/2013 - Review Complete 06/13/2013  Allergen Reaction Noted  . Ciprofloxacin Hives 03/02/2012  . Sulfamethoxazole Itching 09/14/2006  . Trimethoprim Hives 09/14/2006   Past Medical History  Diagnosis Date  . HLD (hyperlipidemia)   . Depression, major     was on multiple medication before followed by psych but was lost to follow up 2-3 years ago when she go arrested, stopped multiple medications that she was on (zoloft, abilify, depakote) , never restarted it  . Anemia     baseline Hb 10-11, ferriting 53  . 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 Hypothyroidism     untreated, non compliance  . Asthma   . Hypothyroidism   . DM type 1 (diabetes mellitus, type 1)     uncontrolled due to medication non compliance, DKA  admission at [facility] in 2008, Dx age 19   . Abnormal Pap smear of cervix     ascus noted 2007  . Gastritis   . Hypertension    Past Surgical History  Procedure Laterality Date  . Other surgical history      right foot  . Foot fusion  2006   Family History  Problem Relation Age of Onset  . Multiple sclerosis Mother   . Hypothyroidism Mother   . Hyperlipidemia Maternal Grandmother   . Hypertension Maternal Grandfather   . Hypertension Maternal Grandmother   . Prostate cancer Maternal Grandfather   . Diabetes type I Maternal Grandfather   . Breast cancer Paternal Grandmother   . Heart disease Maternal Grandmother     unknown type  . Stroke Mother     at age 19 yo   History   Social History  . Marital Status: Divorced    Spouse Name: N/A    Number of Children: 0  . Years of Education: 10th grade   Occupational History  . unemployed     worked at a group   Social History Main Topics  . Smoking status: Current Every Day Smoker -- 0.25 packs/day for 2 years    Types: Cigarettes  . Smokeless tobacco: Never Used     Comment: Will let us know when she is ready.  . Alcohol Use: Yes  . Drug Use: Yes    Special: Marijuana     Comment: marijuana use, several times weekly  . Sexually Active: Yes    Birth Control/ Protection: None   Other Topics Concern  . Not on file   Social History Narrative   Occupation: currently unemployed   Single   Homosexual,     Used to be a gang member, got arrested for robbing a gas station (March - June 2012), is cleared now and lives away from her previous friends.    Living with her current partner in [place].  Mother still active in her life.  mother who is an Glass blower/designer in [employer].    Smoking Status:  Current, Packs/Day:  1   Sexual History:  multiple partners in the past, same sex encounters,current partner is a CNA and she is planning to move in with her   Drug Use:  Marijuana, denies cocaine, heroin, or amphetamines.             Review of Systems: Constitutional: Positive for appetite change, chills.  Denies fever, diaphoresis, and fatigue.  HEENT: Denies photophobia, eye pain, redness, hearing loss, ear pain, congestion, sore throat, rhinorrhea, sneezing,  mouth sores, trouble swallowing, neck pain, neck stiffness and tinnitus.   Respiratory: Denies SOB, DOE, cough, chest tightness,  and wheezing.   Cardiovascular: Denies chest pain, palpitations and leg swelling.  Gastrointestinal: Positive for nausea, vomiting, abdominal pain, constipation.  Denies diarrhea, blood in stool and abdominal distention.  Genitourinary: Positive for frequency.  Denies dysuria, urgency, hematuria, flank pain and difficulty urinating.  Endocrine: Denies: hot or cold intolerance, sweats, changes in hair or nails, polyuria, polydipsia. Musculoskeletal: Denies myalgias, back pain, joint swelling, arthralgias and gait problem.  Skin: Denies pallor, rash and wound.  Neurological: Denies dizziness, seizures, syncope, weakness, light-headedness, numbness and headaches.  Hematological: Denies adenopathy. Easy bruising, personal or family bleeding history  Psychiatric/Behavioral: Denies suicidal ideation, mood changes, confusion, nervousness, sleep disturbance and agitation  Physical Exam: Last menstrual period 05/17/2013. Temp: 98.2 F, Pulse: 99, BP 145/105, SpO2: 97%, Weight 160, Height 5'6" Constitutional: Vital signs reviewed.  Patient is a well-developed and well-nourished woman in mild acute distress and cooperative with exam. Alert and oriented x3.  Head: Normocephalic and atraumatic Ear: TM normal bilaterally Nose: No erythema or drainage noted.  Turbinates normal Mouth: no erythema or exudates, dry mucus membranes.  Eyes: PERRL, EOMI, conjunctivae normal, No scleral icterus.  Neck: Supple, Trachea midline normal ROM, No JVD, mass, thyromegaly, or carotid bruit present.  Cardiovascular: RRR, S1 normal, S2 normal, no MRG, pulses symmetric  and intact bilaterally Pulmonary/Chest: normal respiratory effort, CTAB, no wheezes, rales, or rhonchi Abdominal: Soft. non-distended, moderate tenderness to palpation diffusely with a focus in the epigastric area and LUQ.  bowel sounds are normal, no masses, organomegaly, or guarding present.  GU: left CVA tenderness Musculoskeletal: No joint deformities, erythema, or stiffness, ROM full and no nontender Hematology: no cervical, inginal, or axillary adenopathy.  Neurological: A&O x3, Strength is normal and symmetric bilaterally, cranial nerve II-XII are grossly intact, no focal motor deficit, sensory intact to light touch bilaterally.  Skin: Warm, dry and intact. No rash, cyanosis, or clubbing.  Psychiatric: Normal mood and affect. speech and behavior is normal. Judgment and thought content normal. Cognition and memory are normal.   Lab results: Basic Metabolic Panel:  Recent Labs  06/13/13 1010  NA 138  K 3.8  CL 97  CO2 31  GLUCOSE 66*  BUN 9  CREATININE 1.20*  CALCIUM 9.5   Liver Function Tests:  Recent Labs  06/13/13 1010  AST 14  ALT 7  ALKPHOS 73  BILITOT 0.8  PROT 7.6  ALBUMIN 3.7   No results found for this basename: LIPASE, AMYLASE,  in the last 72 hours  CBC:  Recent Labs  06/13/13 1010  WBC 6.7  NEUTROABS 5.3  HGB 10.3*  HCT 30.7*  MCV 82.3  PLT 167   CBG:  Recent Labs  06/13/13 0905 06/13/13 1005  GLUCAP 60* 63*   Urine Drug Screen: Pending   Alcohol Level: Pending  Urinalysis:  Recent Labs  06/13/13 1011  COLORURINE YELLOW  LABSPEC 1.007  PHURINE 8.0  GLUCOSEU NEG  HGBUR MOD*  BILIRUBINUR NEG  KETONESUR NEG  PROTEINUR 100*  UROBILINOGEN 0.2  NITRITE POS*  LEUKOCYTESUR LARGE*   Imaging results:  No results found.  Assessment & Plan by Problem: Ms. Singelton is a 38 year woman who presents to the [facility] complaining of abdominal pain, nausea, and vomiting  1.  Abdominal pain, nausea, and vomiting:  The patient is afebrile  and her WBC count is not elevated but her urinalysis is very dirty with TNTC WBC  and moderate blood and positive nitrates.  Lipase is pending.  Differential diagnosis includes acute pancreatitis, gastritis, and pyelonephritis.    - Admit to med surg  - NPO except sips with meds  - Morphine 2-4 IV q4  - monitor fever curve  - Rocephin 1 g daily until urine culture comes back  - Protonix  2.  AKI: Creatinine on admission is increased from 0.8 to 1.2.  She appears mildly hypovolemic today.  Differential diagnosis includes volume contraction from vomiting and poor PO intake vs worsening of chronic kidney disease.  She is making urine which makes obstruction less likely.    - Urine Lytes  - monitor I&O  - Fluid hydration with D5NS at 125 ml/hr  3.  Diabetes mellitus type I: She has type I diabetes and has been taking her insulin without eating.  CBG is 60 on arrival to the clinic.    - Lantus 30 units qhs  - SSI-M  - D5 NS at 125 ml/hr for fluid resuscitation and to help raise her blood sugar a bit.   4.  Hypothyroidism: Last TSH was markedly elevated and she states that she hasn't been able to take her medications.    - Continue Levothyroxine 100 mcg daily  5.  HTN: Holding home medications while NPO.  6.  VTE: Lovenox  Dispo: Disposition is deferred at this time, awaiting improvement of current medical problems. Anticipated discharge in approximately 1-2 day(s).   The patient does have a current PCP Eliezer Lofts [place] Abts, MD) and does need an [facility] hospital follow-up appointment after discharge.  The patient does not have transportation limitations that hinder transportation to clinic appointments.  Signed: Trish Fountain, MD 06/13/2013, 11:50 AM

## 2013-06-13 NOTE — Progress Notes (Signed)
I saw patient and discussed case with Dr. Owens Shark at the time of the visit.  We reviewed the resident's history and exam and pertinent patient test results.  I agree with the assessment, diagnosis, and plan of care documented in the resident's note.

## 2013-06-13 NOTE — Progress Notes (Signed)
Patient complaining of constant pain in abdomen and left flank radiating to back and nausea  . zofran 4mg  iv given with minimal relief .  Morphine 4mg  iv given with minimal relief. Heat pack applied to left area of back with some relief of pain after receiving order . [name] Reviewed with patient lbm . Patient reports 6-19. Hypoactive bowel sounds noted . Dr. [name] notified . Orders received . Continue with plan of care .              Angela [name]

## 2013-06-13 NOTE — Plan of Care (Signed)
Problem: Phase I Progression Outcomes Goal: Pain controlled with appropriate interventions Outcome: Not Progressing Patient continues to have pain in abdomen and left flank and back . lbm 06-06-13 Goal: OOB as tolerated unless otherwise ordered Outcome: Not Progressing Patient in pain not getting out of bed only to bathroom

## 2013-06-13 NOTE — Assessment & Plan Note (Signed)
Lab Results  Component Value Date   HGBA1C 10.5 04/22/2013   HGBA1C 14.0* 01/07/2013   HGBA1C 13.4 11/01/2012     Assessment: Diabetes control: poor control (HgbA1C >9%) Progress toward A1C goal:  unchanged Comments: Since our last visit, she has increased Lantus on her own from 30 to 35 units.  She still has not been checking her blood sugars, though she states that her mother is buying her a new glucometer today.  She continues to take novolog 5 units TID.  Interestingly, her CBG is 60 today, compared to 329 last time.  Since we still don't have any data regarding her blood sugars, and her CBG's in clinic have varied widely, I will not change her insulin regimen today.  Plan: Medications:  continue current medications Home glucose monitoring: Frequency: no home glucose monitoring Timing: N/A Instruction/counseling given: reminded to bring blood glucose meter & log to each visit Educational resources provided:   Self management tools provided:   Other plans: Strongly encouraged patient to start checking blood sugars at least 2-3 times per day.

## 2013-06-14 ENCOUNTER — Observation Stay (HOSPITAL_COMMUNITY): Payer: MEDICAID

## 2013-06-14 DIAGNOSIS — F17200 Nicotine dependence, unspecified, uncomplicated: Secondary | ICD-10-CM

## 2013-06-14 DIAGNOSIS — I10 Essential (primary) hypertension: Secondary | ICD-10-CM

## 2013-06-14 DIAGNOSIS — K219 Gastro-esophageal reflux disease without esophagitis: Secondary | ICD-10-CM

## 2013-06-14 DIAGNOSIS — N179 Acute kidney failure, unspecified: Secondary | ICD-10-CM

## 2013-06-14 DIAGNOSIS — E890 Postprocedural hypothyroidism: Secondary | ICD-10-CM

## 2013-06-14 DIAGNOSIS — K5900 Constipation, unspecified: Secondary | ICD-10-CM

## 2013-06-14 DIAGNOSIS — E1059 Type 1 diabetes mellitus with other circulatory complications: Secondary | ICD-10-CM

## 2013-06-14 LAB — CBC
Hemoglobin: 8.7 g/dL — ABNORMAL LOW (ref 12.0–15.0)
MCHC: 32.8 g/dL (ref 30.0–36.0)
Platelets: 140 10*3/uL — ABNORMAL LOW (ref 150–400)
RBC: 3.2 MIL/uL — ABNORMAL LOW (ref 3.87–5.11)

## 2013-06-14 LAB — GLUCOSE, CAPILLARY
Glucose-Capillary: 119 mg/dL — ABNORMAL HIGH (ref 70–99)
Glucose-Capillary: 114 mg/dL — ABNORMAL HIGH (ref 70–99)

## 2013-06-14 LAB — BASIC METABOLIC PANEL
Calcium: 8.7 mg/dL (ref 8.4–10.5)
GFR calc Af Amer: 68 mL/min — ABNORMAL LOW (ref >90)
GFR calc non Af Amer: 59 mL/min — ABNORMAL LOW (ref >90)
Potassium: 3.5 mEq/L (ref 3.5–5.1)
Sodium: 138 mEq/L (ref 135–145)

## 2013-06-14 MED ORDER — INSULIN ASPART 100 UNIT/ML ~~LOC~~ SOLN
0.0000 [IU] | Freq: Three times a day (TID) | SUBCUTANEOUS | Status: DC
  Administered 2013-06-16: 3 [IU] via SUBCUTANEOUS
  Administered 2013-06-17: 2 [IU] via SUBCUTANEOUS
  Administered 2013-06-17: 7 [IU] via SUBCUTANEOUS
  Administered 2013-06-18: 3 [IU] via SUBCUTANEOUS
  Administered 2013-06-18: 1 [IU] via SUBCUTANEOUS

## 2013-06-14 MED ORDER — MORPHINE SULFATE 2 MG/ML IJ SOLN
2.0000 mg | INTRAMUSCULAR | Status: DC | PRN
  Administered 2013-06-14 – 2013-06-16 (×6): 4 mg via INTRAVENOUS
  Filled 2013-06-14 (×6): qty 2

## 2013-06-14 MED ORDER — MORPHINE SULFATE 4 MG/ML IJ SOLN
INTRAMUSCULAR | Status: AC
  Administered 2013-06-14: 4 mg
  Filled 2013-06-14: qty 1

## 2013-06-14 MED ORDER — INSULIN GLARGINE 100 UNIT/ML ~~LOC~~ SOLN
10.0000 [IU] | Freq: Every day | SUBCUTANEOUS | Status: DC
  Administered 2013-06-14 – 2013-06-16 (×2): 10 [IU] via SUBCUTANEOUS
  Filled 2013-06-14 (×4): qty 0.1

## 2013-06-14 MED ORDER — AZITHROMYCIN 500 MG PO TABS
1000.0000 mg | ORAL_TABLET | Freq: Once | ORAL | Status: AC
  Administered 2013-06-14: 1000 mg via ORAL
  Filled 2013-06-14: qty 2

## 2013-06-14 MED ORDER — DIPHENHYDRAMINE HCL 25 MG PO CAPS
25.0000 mg | ORAL_CAPSULE | Freq: Once | ORAL | Status: AC
  Administered 2013-06-15: 25 mg via ORAL
  Filled 2013-06-14: qty 1

## 2013-06-14 MED ORDER — SODIUM CHLORIDE 0.9 % IV SOLN
INTRAVENOUS | Status: DC
  Administered 2013-06-14 – 2013-06-15 (×3): via INTRAVENOUS

## 2013-06-14 MED ORDER — HYDROMORPHONE HCL PF 1 MG/ML IJ SOLN
1.0000 mg | Freq: Once | INTRAMUSCULAR | Status: AC
  Administered 2013-06-14: 1 mg via INTRAVENOUS
  Filled 2013-06-14: qty 1

## 2013-06-14 MED ORDER — DIPHENHYDRAMINE HCL 25 MG PO CAPS
25.0000 mg | ORAL_CAPSULE | Freq: Once | ORAL | Status: AC
  Administered 2013-06-14: 25 mg via ORAL
  Filled 2013-06-14: qty 1

## 2013-06-14 MED ORDER — SENNOSIDES-DOCUSATE SODIUM 8.6-50 MG PO TABS
1.0000 | ORAL_TABLET | Freq: Two times a day (BID) | ORAL | Status: DC
  Administered 2013-06-14 – 2013-06-18 (×8): 1 via ORAL
  Filled 2013-06-14 (×8): qty 1

## 2013-06-14 NOTE — Progress Notes (Signed)
Subjective: Ms. Dubbert was seen and examined at bedside.  She continues to have decreased appetite and abdominal pain that is well controlled with pain medication.  She denies any vomiting but did have some nausea with dinner yesterday.  Her girlfriend is present in the room as well.  She continues to have lower left back pain now radiating to right side.  She denies any chest pain, fever, chills, shortness of breath, headache, or dysuria.    Objective: Vital signs in last 24 hours: Filed Vitals:   06/13/13 1224 06/13/13 1225 06/13/13 2312 06/14/13 0511  BP: 131/93 118/87 127/91 130/93  Pulse: 101 106 97 89  Temp:   98.4 F (36.9 C) 98.4 F (36.9 C)  TempSrc:   Oral Oral  Resp:   18 18  Height:      Weight:      SpO2:   98% 98%   Weight change:   Intake/Output Summary (Last 24 hours) at 06/14/13 1219 Last data filed at 06/14/13 0914  Gross per 24 hour  Intake 2622.92 ml  Output   1050 ml  Net 1572.92 ml  Vitals reviewed. General: resting in bed, NAD HEENT: PERRL, EOMI, no scleral icterus Cardiac: RRR, no rubs, murmurs or gallops Pulm: clear to auscultation bilaterally, no wheezes, rales, or rhonchi Abd: soft, tender to palpation epigastric and R side, +cva tenderness b/l, voluntary guarding +rebound tenderness, nondistended, BS present Ext: warm and well perfused, no pedal edema, +2dp b/l Neuro: alert and oriented X3, cranial nerves II-XII grossly intact, strength and sensation to light touch equal in bilateral upper and lower extremities  Lab Results: Basic Metabolic Panel:  Recent Labs Lab 06/13/13 1010 06/14/13 0505  NA 138 138  K 3.8 3.5  CL 97 102  CO2 31 28  GLUCOSE 66* 117*  BUN 9 6  CREATININE 1.20* 1.24*  CALCIUM 9.5 8.7   Liver Function Tests:  Recent Labs Lab 06/13/13 1010  AST 14  ALT 7  ALKPHOS 73  BILITOT 0.8  PROT 7.6  ALBUMIN 3.7    Recent Labs Lab 06/13/13 1010  LIPASE 59   CBC:  Recent Labs Lab 06/13/13 1010 06/14/13 0505   WBC 6.7 5.0  NEUTROABS 5.3  --   HGB 10.3* 8.7*  HCT 30.7* 26.5*  MCV 82.3 82.8  PLT 167 140*   CBG:  Recent Labs Lab 06/13/13 1551 06/13/13 2004 06/13/13 2310 06/14/13 0513 06/14/13 0759 06/14/13 1129  GLUCAP 159* 132* 111* 119* 106* 193*   Urine Drug Screen: Drugs of Abuse     Component Value Date/Time   LABOPIA POSITIVE* 06/13/2013 1356   COCAINSCRNUR NONE DETECTED 06/13/2013 1356   LABBENZ NONE DETECTED 06/13/2013 1356   AMPHETMU NONE DETECTED 06/13/2013 1356   THCU POSITIVE* 06/13/2013 1356   LABBARB NONE DETECTED 06/13/2013 1356    Alcohol Level:  Recent Labs Lab 06/13/13 1427  ETH <11   Urinalysis:  Recent Labs Lab 06/13/13 1011  COLORURINE YELLOW  LABSPEC 1.007  PHURINE 8.0  GLUCOSEU NEG  HGBUR MOD*  BILIRUBINUR NEG  KETONESUR NEG  PROTEINUR 100*  UROBILINOGEN 0.2  NITRITE POS*  LEUKOCYTESUR LARGE*   Medications: I have reviewed the patient's current medications. Scheduled Meds: . cefTRIAXone (ROCEPHIN)  IV  1 g Intravenous Q24H  . enoxaparin (LOVENOX) injection  40 mg Subcutaneous Daily  . insulin aspart  0-9 Units Subcutaneous TID WC  . insulin glargine  10 Units Subcutaneous QHS  . levothyroxine  100 mcg Oral QAC breakfast  .  pantoprazole (PROTONIX) IV  40 mg Intravenous Q24H  . senna-docusate  1 tablet Oral BID   Continuous Infusions: . sodium chloride 100 mL/hr at 06/14/13 1204   PRN Meds:.morphine injection, ondansetron (ZOFRAN) IV, ondansetron Assessment/Plan: Ms. Trejo is a 60 year woman who presents to the [facility] complaining of abdominal pain, nausea, and vomiting.    Abdominal pain, nausea, and vomiting: likely secondary to UTI with possible pyelonephritis.  Although afebrile and no leukocytosis but does have chills.  U/a on admission positive for nitrites, large leukocytes, moderate Hb, TNTC wbc and 3-6 rbc.   Lipase 59 making pancreatitis less likely.  Possible gastritis as well vs. PID.   - NPO except sips with meds  - Morphine  2-4 IV q4  - monitor fever curve  - continue Rocephin 1 g daily until urine culture comes back day 2 - azithromycin 1000mg  x1 - Protonix  - GC/Chlamydia probe   AKI: Cr slightly increased to 1.24 this morning.  Admission Cr 1.20. Likely pre-renal etiology with FeNA of 0.59% in the setting of decreased po intake causing hypovolemia.  UNA 63 and UCr 93.36. - monitor I&O  - IVF NS   Diabetes mellitus type I:uncontrolled, HbA1C 10.5 04/22/2013.  On home regimen: Lantus 35 units qhs,  novolog 4-12 units TID before meals.  Currently NPO.  CBG was 60 on arrival to the clinic.  -CBG monitoring - Lantus 10 units qhs - SSI-sensitive - NS IVF   Hypothyroidism: Last TSH was markedly elevated at 34.002 on 04/22/13.  On home Levothyroxine 152mcg qd  - Continue home dose levothyroxine  HTN: on home regimen Lisinopril 10mg  qd.  BP on admission 145/102 in setting of severe pain and not taking her medications.  Improved since admission.   -Holding home medications while NPO and in setting of AKI -continue to monitor -pain control  VTE: Lovenox  Dispo: Disposition is deferred at this time, awaiting improvement of current medical problems. Anticipated discharge in approximately 1-2 day(s).   The patient does have a current PCP Eliezer Lofts [place] Abts, MD) and does need an [facility] hospital follow-up appointment after discharge.  The patient does not have transportation limitations that hinder transportation to clinic appointments.  Services Needed at time of discharge: Y = Yes, Blank = No PT:   OT:   RN:   Equipment:   Other:     LOS: 1 day   Jerene Pitch, MD 06/14/2013, 12:19 PM

## 2013-06-14 NOTE — H&P (Signed)
Date: 06/14/2013 Patient name: Angela [name]  Medical record number: [id number]  Date of birth: 02-24-1986  The patient, Angela [name], is a 27 y.o. year old female with polysubstance abuse and past medical history as below, presenting with a week of progressively getting worse abdominal pain, mostly concentrated around left flank. She has not been able to keep anything down for 3 days. She reports increased frequncy bu no dysuria. I have read the HPI by Dr. Obie Dredge and I agree with the course of events.   She has a past medical history of HLD (hyperlipidemia); Depression, major; Anemia; Insomnia; Hypothyroidism; Asthma; Hypothyroidism; Abnormal Pap smear of cervix; Gastritis; Hypertension; DM type 1 (diabetes mellitus, type 1); GERD (gastroesophageal reflux disease); and Neuromuscular disorder.  Filed Vitals:   06/13/13 1224 06/13/13 1225 06/13/13 2312 06/14/13 0511  BP: 131/93 118/87 127/91 130/93  Pulse: 101 106 97 89  Temp:   98.4 F (36.9 C) 98.4 F (36.9 C)  TempSrc:   Oral Oral  Resp:   18 18  Height:      Weight:      SpO2:   98% 98%    General: Resting in bed. No acute distress. Cheerful disposition. HEENT: PERRL, EOMI, no scleral icterus. Heart: RRR, no rubs, murmurs or gallops. Lungs: Clear to auscultation bilaterally, no wheezes, rales, or rhonchi. Abdomen: Soft, moderately tender at left flank and back, nondistended, BS present. Skin: Multiple tattoos.  Extremities: Warm, no pedal edema. Neuro: Alert and oriented X3, cranial nerves II-XII grossly intact,  strength and sensation to light touch equal in bilateral upper and lower extremities  Lab trends  Recent Labs Lab 06/13/13 1010 06/14/13 0505  HGB 10.3* 8.7*  HCT 30.7* 26.5*  WBC 6.7 5.0  PLT 167 140*    Recent Labs Lab 06/13/13 1010 06/14/13 0505  NA 138 138  K 3.8 3.5  CL 97 102  CO2 31 28  GLUCOSE 66* 117*  BUN 9 6  CREATININE 1.20* 1.24*  CALCIUM 9.5 8.7    Recent Labs Lab  06/13/13 1010  AST 14  ALT 7  ALKPHOS 73  BILITOT 0.8  PROT 7.6  ALBUMIN 3.7    No results found for this basename: TROPONINI,  in the last 168 hours Urinalysis    Component Value Date/Time   COLORURINE YELLOW 06/13/2013 1011   APPEARANCEUR CLOUDY* 06/13/2013 1011   LABSPEC 1.007 06/13/2013 1011   PHURINE 8.0 06/13/2013 1011   GLUCOSEU NEG 06/13/2013 1011   HGBUR MOD* 06/13/2013 1011   BILIRUBINUR NEG 06/13/2013 1011   KETONESUR NEG 06/13/2013 1011   PROTEINUR 100* 06/13/2013 1011   UROBILINOGEN 0.2 06/13/2013 1011   NITRITE POS* 06/13/2013 1011   LEUKOCYTESUR LARGE* 06/13/2013 1011    EKG reviewed.  Imaging reviewed.  Hospital Medications . cefTRIAXone (ROCEPHIN)  IV  1 g Intravenous Q24H  . enoxaparin (LOVENOX) injection  40 mg Subcutaneous Daily  . insulin aspart  0-15 Units Subcutaneous Q4H  . insulin glargine  25 Units Subcutaneous QHS  . levothyroxine  100 mcg Oral QAC breakfast  . pantoprazole (PROTONIX) IV  40 mg Intravenous Q24H  . senna-docusate  1 tablet Oral BID     Assessment and Plan   I have read the note by Dr. Obie Dredge. I discussed the case with my resident team also this morning. I agree with the plan of care, with the following additions:  Acute Pyelonephritis with acute renal failure - Continue ceftriaxone and hydration, with supportive management for nausea and vomiting. We  will also obtain a usg abdomen to rule out hydronephrosis or obstruction. Follow BUN creatinine.     Hypoglycemia on arrival in the setting of Diabetes Type I and starvation - The patient is still keeping BS in low hundreds with decreased dose of lantus and SSI. She is NPO. We will decrease insulin further.   Recent Labs Lab 06/13/13 1551 06/13/13 2004 06/13/13 2310 06/14/13 0513 06/14/13 0759  GLUCAP 159* 132* 111* 119* 106*   Rest of the chronic issues per resident note.   [place], [place] 06/14/2013, 11:22 AM.

## 2013-06-14 NOTE — Progress Notes (Signed)
Visit to patient while in hospital. Will call after patient is discharged to assist with transition of care. 

## 2013-06-14 NOTE — Progress Notes (Signed)
Pt very unhappy about not having her ultrasound done , She was asked to be NPO since 0900 this am and that her ultrasound would be at 1430 it is now 1652 and she wanted  Her MD called and  This was done MD called to room

## 2013-06-15 DIAGNOSIS — D649 Anemia, unspecified: Secondary | ICD-10-CM

## 2013-06-15 DIAGNOSIS — F329 Major depressive disorder, single episode, unspecified: Secondary | ICD-10-CM

## 2013-06-15 LAB — BASIC METABOLIC PANEL
CO2: 28 mEq/L (ref 19–32)
Calcium: 8.8 mg/dL (ref 8.4–10.5)
Chloride: 107 mEq/L (ref 96–112)
Creatinine, Ser: 1.12 mg/dL — ABNORMAL HIGH (ref 0.50–1.10)
Glucose, Bld: 83 mg/dL (ref 70–99)
Sodium: 141 mEq/L (ref 135–145)

## 2013-06-15 LAB — GLUCOSE, CAPILLARY
Glucose-Capillary: 84 mg/dL (ref 70–99)
Glucose-Capillary: 122 mg/dL — ABNORMAL HIGH (ref 70–99)
Glucose-Capillary: 110 mg/dL — ABNORMAL HIGH (ref 70–99)
Glucose-Capillary: 89 mg/dL (ref 70–99)

## 2013-06-15 LAB — CBC
Hemoglobin: 8.5 g/dL — ABNORMAL LOW (ref 12.0–15.0)
MCH: 26.7 pg (ref 26.0–34.0)
MCV: 82.4 fL (ref 78.0–100.0)
RBC: 3.18 MIL/uL — ABNORMAL LOW (ref 3.87–5.11)

## 2013-06-15 LAB — URINE CULTURE: Colony Count: NO GROWTH

## 2013-06-15 MED ORDER — POLYETHYLENE GLYCOL 3350 17 G PO PACK
17.0000 g | PACK | Freq: Two times a day (BID) | ORAL | Status: DC
  Administered 2013-06-15 – 2013-06-18 (×7): 17 g via ORAL
  Filled 2013-06-15 (×8): qty 1

## 2013-06-15 MED ORDER — GLUCOSE 40 % PO GEL
1.0000 | ORAL | Status: DC | PRN
  Administered 2013-06-15: 37.5 g via ORAL
  Filled 2013-06-15: qty 1

## 2013-06-15 MED ORDER — PANTOPRAZOLE SODIUM 40 MG PO TBEC
40.0000 mg | DELAYED_RELEASE_TABLET | Freq: Every day | ORAL | Status: DC
  Administered 2013-06-15 – 2013-06-17 (×3): 40 mg via ORAL
  Filled 2013-06-15 (×3): qty 1

## 2013-06-15 MED ORDER — GLUCOSE 40 % PO GEL
ORAL | Status: AC
  Filled 2013-06-15: qty 1

## 2013-06-15 NOTE — Progress Notes (Signed)
Subjective: Angela [name] was seen and examined at bedside.  She was able to eat this morning and would like to try solid food. She is having less abdominal pain this morning. She denies any vomiting. She denies any chest pain, fever, chills, shortness of breath, headache, or dysuria.    Objective: Vital signs in last 24 hours: Filed Vitals:   06/14/13 1733 06/14/13 2250 06/15/13 0540 06/15/13 1428  BP: 140/95 127/79 123/84 163/104  Pulse: 100 93 89 119  Temp: 98.3 F (36.8 C) 98.2 F (36.8 C) 98.4 F (36.9 C) 98.1 F (36.7 C)  TempSrc: Oral Oral Oral Oral  Resp: 18 16 16 18   Height:      Weight:      SpO2: 100% 100% 99% 100%   Weight change:   Intake/Output Summary (Last 24 hours) at 06/15/13 1504 Last data filed at 06/15/13 0620  Gross per 24 hour  Intake 1755.33 ml  Output    900 ml  Net 855.33 ml  Vitals reviewed. General: resting in bed, NAD HEENT: PERRL, EOMI, no scleral icterus Cardiac: RRR, no rubs, murmurs or gallops Pulm: clear to auscultation bilaterally, no wheezes, rales, or rhonchi Abd: soft, mildly tender to palpation suprapubic and R side flank,  nondistended, BS present Ext: warm and well perfused, no pedal edema, +2dp b/l Neuro: alert and oriented X3, cranial nerves II-XII grossly intact, strength and sensation to light touch equal in bilateral upper and lower extremities  Lab Results: Basic Metabolic Panel:  Recent Labs Lab 06/14/13 0505 06/15/13 0510  NA 138 141  K 3.5 3.8  CL 102 107  CO2 28 28  GLUCOSE 117* 83  BUN 6 5*  CREATININE 1.24* 1.12*  CALCIUM 8.7 8.8  MG  --  1.8   Liver Function Tests:  Recent Labs Lab 06/13/13 1010  AST 14  ALT 7  ALKPHOS 73  BILITOT 0.8  PROT 7.6  ALBUMIN 3.7    Recent Labs Lab 06/13/13 1010  LIPASE 59   CBC:  Recent Labs Lab 06/13/13 1010 06/14/13 0505 06/15/13 0510  WBC 6.7 5.0 4.8  NEUTROABS 5.3  --   --   HGB 10.3* 8.7* 8.5*  HCT 30.7* 26.5* 26.2*  MCV 82.3 82.8 82.4  PLT 167  140* 160   CBG:  Recent Labs Lab 06/14/13 1129 06/14/13 1600 06/14/13 2228 06/15/13 0800 06/15/13 0830 06/15/13 1144  GLUCAP 193* 87 114* 41* 84 122*   Urine Drug Screen: Drugs of Abuse     Component Value Date/Time   LABOPIA POSITIVE* 06/13/2013 1356   COCAINSCRNUR NONE DETECTED 06/13/2013 1356   LABBENZ NONE DETECTED 06/13/2013 1356   AMPHETMU NONE DETECTED 06/13/2013 1356   THCU POSITIVE* 06/13/2013 1356   LABBARB NONE DETECTED 06/13/2013 1356    Alcohol Level:  Recent Labs Lab 06/13/13 1427  ETH <11   Urinalysis:  Recent Labs Lab 06/13/13 1011  COLORURINE YELLOW  LABSPEC 1.007  PHURINE 8.0  GLUCOSEU NEG  HGBUR MOD*  BILIRUBINUR NEG  KETONESUR NEG  PROTEINUR 100*  UROBILINOGEN 0.2  NITRITE POS*  LEUKOCYTESUR LARGE*   Medications: I have reviewed the patient's current medications. Scheduled Meds: . cefTRIAXone (ROCEPHIN)  IV  1 g Intravenous Q24H  . dextrose      . enoxaparin (LOVENOX) injection  40 mg Subcutaneous Daily  . insulin aspart  0-9 Units Subcutaneous TID WC  . insulin glargine  10 Units Subcutaneous QHS  . levothyroxine  100 mcg Oral QAC breakfast  . pantoprazole  40 mg Oral QHS  . polyethylene glycol  17 g Oral BID  . senna-docusate  1 tablet Oral BID   Continuous Infusions:   PRN Meds:.dextrose, morphine injection, ondansetron (ZOFRAN) IV, ondansetron Assessment/Plan: Angela [name] is a 72 year woman who presents to the [facility] complaining of abdominal pain, nausea, and vomiting.    Abdominal pain, nausea, and vomiting: likely secondary to UTI with possible pyelonephritis.  U/a on admission positive for nitrites, large leukocytes, moderate Hb, TNTC wbc and 3-6 rbc. Could be secondary to constipation as well (last BM more than 1 week) in addition to senokot will add miralax today. - monitor fever curve  - continue Rocephin 1 g daily until urine culture comes back day 3 - Protonix  - GC/Chlamydia probe   AKI: Cr slightly better at 1.1 this  morning.  Likely pre-renal etiology with FeNA of 0.59% in the setting of decreased po intake causing hypovolemia.  - monitor I&O  -stop  IVF NS   Diabetes mellitus type I:uncontrolled, HbA1C 10.5 04/22/2013.  On home regimen: Lantus 35 units qhs,  novolog 4-12 units TID before meals.  Currently NPO.  CBG was 60 on arrival to the clinic.  -CBG monitoring - Lantus 10 units qhs - SSI-sensitive  Hypothyroidism: Last TSH was markedly elevated at 34.002 on 04/22/13.  On home Levothyroxine 182mcg qd  - Continue home dose levothyroxine  HTN: on home regimen Lisinopril 10mg  qd.  BP on admission 145/102 in setting of severe pain and not taking her medications.  Improved since admission.   -Holding home medications while NPO and in setting of AKI -continue to monitor -pain control  VTE: Lovenox  Dispo: Disposition is deferred at this time, awaiting improvement of current medical problems. Anticipated discharge in approximately 1-2 day(s).   The patient does have a current PCP Eliezer Lofts [place] Abts, MD) and does need an [facility] hospital follow-up appointment after discharge.  The patient does not have transportation limitations that hinder transportation to clinic appointments.  Services Needed at time of discharge: Y = Yes, Blank = No PT:   OT:   RN:   Equipment:   Other:     LOS: 2 days   Olga Millers, MD 06/15/2013, 3:04 PM

## 2013-06-15 NOTE — Progress Notes (Addendum)
Notified physician on call of pt CBG at 2130 of 89 and that Lantus was held this pm because pt states she cannot eat a snack. Received no new orders.

## 2013-06-15 NOTE — Progress Notes (Signed)
Hypoglycemic episode early this am.  Pt was asymptomatic. Treated with 1 glucogel and encouraged to eat.  CBG WNL after treatment.

## 2013-06-16 DIAGNOSIS — E785 Hyperlipidemia, unspecified: Secondary | ICD-10-CM

## 2013-06-16 LAB — CBC
Hemoglobin: 8.5 g/dL — ABNORMAL LOW (ref 12.0–15.0)
Platelets: 188 10*3/uL (ref 150–400)
RBC: 3.16 MIL/uL — ABNORMAL LOW (ref 3.87–5.11)
WBC: 4.7 10*3/uL (ref 4.0–10.5)

## 2013-06-16 LAB — BASIC METABOLIC PANEL
CO2: 28 mEq/L (ref 19–32)
Calcium: 9 mg/dL (ref 8.4–10.5)
Glucose, Bld: 67 mg/dL — ABNORMAL LOW (ref 70–99)
Potassium: 3.4 mEq/L — ABNORMAL LOW (ref 3.5–5.1)
Sodium: 140 mEq/L (ref 135–145)

## 2013-06-16 LAB — GLUCOSE, CAPILLARY
Glucose-Capillary: 134 mg/dL — ABNORMAL HIGH (ref 70–99)
Glucose-Capillary: 72 mg/dL (ref 70–99)
Glucose-Capillary: 206 mg/dL — ABNORMAL HIGH (ref 70–99)

## 2013-06-16 MED ORDER — NITROFURANTOIN MACROCRYSTAL 100 MG PO CAPS
100.0000 mg | ORAL_CAPSULE | Freq: Two times a day (BID) | ORAL | Status: AC
  Administered 2013-06-16 – 2013-06-17 (×4): 100 mg via ORAL
  Filled 2013-06-16 (×5): qty 1

## 2013-06-16 MED ORDER — BISACODYL 10 MG RE SUPP
10.0000 mg | Freq: Once | RECTAL | Status: DC
  Filled 2013-06-16: qty 1

## 2013-06-16 MED ORDER — OXYCODONE-ACETAMINOPHEN 5-325 MG PO TABS
1.0000 | ORAL_TABLET | ORAL | Status: DC | PRN
  Administered 2013-06-16: 1 via ORAL
  Administered 2013-06-16: 2 via ORAL
  Administered 2013-06-17 (×2): 1 via ORAL
  Administered 2013-06-18: 2 via ORAL
  Filled 2013-06-16: qty 1
  Filled 2013-06-16 (×4): qty 2

## 2013-06-16 MED ORDER — POTASSIUM CHLORIDE CRYS ER 20 MEQ PO TBCR
40.0000 meq | EXTENDED_RELEASE_TABLET | Freq: Once | ORAL | Status: AC
  Administered 2013-06-16: 40 meq via ORAL
  Filled 2013-06-16: qty 2

## 2013-06-16 MED ORDER — BISACODYL 10 MG RE SUPP
10.0000 mg | Freq: Once | RECTAL | Status: AC
  Administered 2013-06-16: 10 mg via RECTAL
  Filled 2013-06-16: qty 1

## 2013-06-16 NOTE — Progress Notes (Signed)
Subjective: The patient is feeling okay this morning. She was not able to have a bowel movement yesterday although she did pass some gas. She was able to eat some minimal food although this caused some nausea. She is having less abdominal pain this morning. She is having some back pain which is muscle pain. She is getting some cramps in her flank. She denies any vomiting. She denies any chest pain, fever, chills, shortness of breath, headache, or dysuria.    Objective: Vital signs in last 24 hours: Filed Vitals:   06/15/13 0540 06/15/13 1428 06/15/13 2132 06/16/13 0518  BP: 123/84 163/104 141/95 142/92  Pulse: 89 119 106 92  Temp: 98.4 F (36.9 C) 98.1 F (36.7 C) 98.2 F (36.8 C) 98.3 F (36.8 C)  TempSrc: Oral Oral Oral Oral  Resp: 16 18 18 18   Height:      Weight:      SpO2: 99% 100% 100% 100%   Weight change:   Intake/Output Summary (Last 24 hours) at 06/16/13 1023 Last data filed at 06/16/13 0503  Gross per 24 hour  Intake    275 ml  Output      0 ml  Net    275 ml  Vitals reviewed. General: resting in bed, NAD HEENT: PERRL, EOMI, no scleral icterus Cardiac: RRR, no rubs, murmurs or gallops Pulm: clear to auscultation bilaterally, no wheezes, rales, or rhonchi Abd: soft, non-tender, some tenderness along the paraspinal muscles bilaterally around L2-L3,  nondistended, BS present Ext: warm and well perfused, no pedal edema, +2dp b/l Neuro: alert and oriented X3, cranial nerves II-XII grossly intact, strength and sensation to light touch equal in bilateral upper and lower extremities  Lab Results: Basic Metabolic Panel:  Recent Labs Lab 06/15/13 0510 06/16/13 0621  NA 141 140  K 3.8 3.4*  CL 107 105  CO2 28 28  GLUCOSE 83 67*  BUN 5* 4*  CREATININE 1.12* 1.07  CALCIUM 8.8 9.0  MG 1.8  --    Liver Function Tests:  Recent Labs Lab 06/13/13 1010  AST 14  ALT 7  ALKPHOS 73  BILITOT 0.8  PROT 7.6  ALBUMIN 3.7    Recent Labs Lab 06/13/13 1010    LIPASE 59   CBC:  Recent Labs Lab 06/13/13 1010  06/15/13 0510 06/16/13 0621  WBC 6.7  < > 4.8 4.7  NEUTROABS 5.3  --   --   --   HGB 10.3*  < > 8.5* 8.5*  HCT 30.7*  < > 26.2* 26.1*  MCV 82.3  < > 82.4 82.6  PLT 167  < > 160 188  < > = values in this interval not displayed. CBG:  Recent Labs Lab 06/15/13 0800 06/15/13 0830 06/15/13 1144 06/15/13 1657 06/15/13 2131 06/16/13 0202  GLUCAP 41* 84 122* 110* 89 134*   Urine Drug Screen: Drugs of Abuse     Component Value Date/Time   LABOPIA POSITIVE* 06/13/2013 1356   COCAINSCRNUR NONE DETECTED 06/13/2013 1356   LABBENZ NONE DETECTED 06/13/2013 1356   AMPHETMU NONE DETECTED 06/13/2013 1356   THCU POSITIVE* 06/13/2013 1356   LABBARB NONE DETECTED 06/13/2013 1356    Alcohol Level:  Recent Labs Lab 06/13/13 1427  ETH <11   Urinalysis:  Recent Labs Lab 06/13/13 1011  COLORURINE YELLOW  LABSPEC 1.007  PHURINE 8.0  GLUCOSEU NEG  HGBUR MOD*  BILIRUBINUR NEG  KETONESUR NEG  PROTEINUR 100*  UROBILINOGEN 0.2  NITRITE POS*  LEUKOCYTESUR LARGE*   Medications:  I have reviewed the patient's current medications. Scheduled Meds: . bisacodyl  10 mg Rectal Once  . enoxaparin (LOVENOX) injection  40 mg Subcutaneous Daily  . insulin aspart  0-9 Units Subcutaneous TID WC  . insulin glargine  10 Units Subcutaneous QHS  . levothyroxine  100 mcg Oral QAC breakfast  . nitrofurantoin  100 mg Oral Q12H  . pantoprazole  40 mg Oral QHS  . polyethylene glycol  17 g Oral BID  . potassium chloride  40 mEq Oral Once  . senna-docusate  1 tablet Oral BID   Continuous Infusions:   PRN Meds:.dextrose, ondansetron, oxyCODONE-acetaminophen Assessment/Plan: Ms. Lodico is a 89 year woman who presents to the [facility] complaining of abdominal pain, nausea, and vomiting.    Abdominal pain, nausea, and vomiting: UTI resolving and no likely discomfort from constipation. She has been more than 1 week without bowel movement. Will switch to  macrobid today for total of 5 day treatment. Getting miralax, senokot and will add dulcolax suppository today.  - Protonix  -Add dulcolax suppository  AKI: Resolving. Cr better at 1.0 this morning.  Likely pre-renal etiology with FeNA of 0.59% in the setting of decreased po intake causing hypovolemia.  -doing well off IVF   Diabetes mellitus type I:uncontrolled, HbA1C 10.5 04/22/2013.  On home regimen: Lantus 35 units qhs,  novolog 4-12 units TID before meals.   -CBG monitoring - Lantus 10 units qhs - SSI-sensitive  Hypothyroidism: Last TSH was markedly elevated at 34.002 on 04/22/13.  On home Levothyroxine 144mcg qd  - Continue home dose levothyroxine  HTN: on home regimen Lisinopril 10mg  qd.  BP on admission 145/102 in setting of severe pain and not taking her medications.  Improved since admission.   -continue to monitor  VTE: Lovenox  Dispo: Disposition is deferred at this time, awaiting improvement of current medical problems. Anticipated discharge in approximately 1 day(s).   The patient does have a current PCP Eliezer Lofts [place] Abts, MD) and does need an [facility] hospital follow-up appointment after discharge.  The patient does not have transportation limitations that hinder transportation to clinic appointments.  Services Needed at time of discharge: Y = Yes, Blank = No PT:   OT:   RN:   Equipment:   Other:     LOS: 3 days   Olga Millers, MD 06/16/2013, 10:23 AM

## 2013-06-17 LAB — TYPE AND SCREEN: ABO/RH(D): O POS

## 2013-06-17 LAB — CBC
HCT: 25.3 % — ABNORMAL LOW (ref 36.0–46.0)
MCH: 27.3 pg (ref 26.0–34.0)
MCV: 80.9 fL (ref 78.0–100.0)
MCV: 81.4 fL (ref 78.0–100.0)
Platelets: 184 10*3/uL (ref 150–400)
Platelets: 226 10*3/uL (ref 150–400)
RBC: 2.78 MIL/uL — ABNORMAL LOW (ref 3.87–5.11)
RBC: 3.11 MIL/uL — ABNORMAL LOW (ref 3.87–5.11)
RDW: 12.1 % (ref 11.5–15.5)
WBC: 4 10*3/uL (ref 4.0–10.5)
WBC: 5.3 10*3/uL (ref 4.0–10.5)

## 2013-06-17 LAB — BASIC METABOLIC PANEL
CO2: 26 mEq/L (ref 19–32)
Calcium: 9 mg/dL (ref 8.4–10.5)
Chloride: 104 mEq/L (ref 96–112)
Creatinine, Ser: 1.2 mg/dL — ABNORMAL HIGH (ref 0.50–1.10)
GFR calc Af Amer: 71 mL/min — ABNORMAL LOW (ref >90)
Sodium: 137 mEq/L (ref 135–145)

## 2013-06-17 LAB — URINALYSIS, ROUTINE W REFLEX MICROSCOPIC
Glucose, UA: NEGATIVE mg/dL
Leukocytes, UA: NEGATIVE
pH: 6.5 (ref 5.0–8.0)

## 2013-06-17 LAB — GLUCOSE, CAPILLARY
Glucose-Capillary: 91 mg/dL (ref 70–99)
Glucose-Capillary: 171 mg/dL — ABNORMAL HIGH (ref 70–99)

## 2013-06-17 LAB — URINE MICROSCOPIC-ADD ON

## 2013-06-17 LAB — TECHNOLOGIST SMEAR REVIEW

## 2013-06-17 MED ORDER — INSULIN GLARGINE 100 UNIT/ML ~~LOC~~ SOLN
5.0000 [IU] | Freq: Every day | SUBCUTANEOUS | Status: DC
  Filled 2013-06-17: qty 0.05

## 2013-06-17 MED ORDER — SODIUM CHLORIDE 0.9 % IV SOLN
INTRAVENOUS | Status: AC
  Administered 2013-06-17: 19:00:00 via INTRAVENOUS

## 2013-06-17 MED ORDER — AMLODIPINE BESYLATE 5 MG PO TABS
5.0000 mg | ORAL_TABLET | Freq: Every day | ORAL | Status: DC
  Administered 2013-06-17 – 2013-06-18 (×2): 5 mg via ORAL
  Filled 2013-06-17 (×2): qty 1

## 2013-06-17 MED ORDER — METOCLOPRAMIDE HCL 10 MG PO TABS
10.0000 mg | ORAL_TABLET | Freq: Three times a day (TID) | ORAL | Status: DC
  Administered 2013-06-17 – 2013-06-18 (×5): 10 mg via ORAL
  Filled 2013-06-17 (×7): qty 1

## 2013-06-17 MED ORDER — INSULIN GLARGINE 100 UNIT/ML ~~LOC~~ SOLN
10.0000 [IU] | Freq: Every day | SUBCUTANEOUS | Status: DC
  Filled 2013-06-17: qty 0.1

## 2013-06-17 MED ORDER — ONDANSETRON HCL 4 MG PO TABS
4.0000 mg | ORAL_TABLET | Freq: Four times a day (QID) | ORAL | Status: DC | PRN
  Administered 2013-06-17: 4 mg via ORAL
  Filled 2013-06-17: qty 1

## 2013-06-17 MED ORDER — INSULIN GLARGINE 100 UNIT/ML ~~LOC~~ SOLN
10.0000 [IU] | Freq: Every day | SUBCUTANEOUS | Status: DC
  Administered 2013-06-17: 10 [IU] via SUBCUTANEOUS
  Filled 2013-06-17 (×2): qty 0.1

## 2013-06-17 NOTE — Progress Notes (Signed)
Internal Medicine Teaching Service Attending Note Date: 06/17/2013  Patient name: Angela [name]  Medical record number: [id number]  Date of birth: 1986-04-13  I met with this patient with my resident team this morning. She appears to be much clinically improved. She still has some abdominal pain, however has been tolerating clears. She was started on Macrobid yesterday and some decline in hemoglobin has been observed. We will follow this up and try to assess the etiology - and rule out hemolysis if possible. She will remain with [place] for one more day at least, so we figure out the cause of hemoglobin decline, and give her some more time to start tolerating oral diet better. I have discussed her care management in detail with my team. Please see my resident's note for details.     [place], [place] 06/17/2013, 12:52 PM.

## 2013-06-17 NOTE — Progress Notes (Addendum)
Subjective: Angela [name] was seen and examined at bedside this morning.  She was sleeping when I walked in but did wake up to answer questions.  She reports improvement in her abdominal pain over the weekend and did have one loose BM last night.  She denies any blood in her stool and has not started her period yet although she is expecting it soon.  She is passing some gas.  Unfortunately, she was unable to tolerate diet last night, she took a few bites of dinner but vomited right after.  This morning she continues to have nausea.  Her back pain has resolved and she denies any chest pain, fever, chills, shortness of breath, headache, or dysuria.    Of note, she did have an episode of hypoglycemia this morning, with CBG 39 and improved to 91  Objective: Vital signs in last 24 hours: Filed Vitals:   06/16/13 2200 06/17/13 0525 06/17/13 0809 06/17/13 0945  BP: 140/98 136/94 153/101 150/100  Pulse: 90 87 93   Temp: 97.8 F (36.6 C) 97.9 F (36.6 C) 98.2 F (36.8 C)   TempSrc: Oral  Oral   Resp: 17 18 18    Height:      Weight:      SpO2: 100% 98% 94%    Weight change:   Intake/Output Summary (Last 24 hours) at 06/17/13 1233 Last data filed at 06/16/13 1300  Gross per 24 hour  Intake    240 ml  Output      0 ml  Net    240 ml  Vitals reviewed. General: resting in bed, NAD HEENT: PERRL, EOMI, no scleral icterus Cardiac: RRR, no rubs, murmurs or gallops Pulm: clear to auscultation bilaterally, no wheezes, rales, or rhonchi Abd: soft, mild tenderness to palpation of epigastric region today, nondistended, BS present Ext: warm and well perfused, no pedal edema, +2dp b/l Neuro: alert and oriented X3, cranial nerves II-XII grossly intact, strength and sensation to light touch equal in bilateral upper and lower extremities  Lab Results: Basic Metabolic Panel:  Recent Labs Lab 06/15/13 0510 06/16/13 0621 06/17/13 0545  NA 141 140 137  K 3.8 3.4* 3.9  CL 107 105 104  CO2 28 28 26     GLUCOSE 83 67* 212*  BUN 5* 4* 7  CREATININE 1.12* 1.07 1.20*  CALCIUM 8.8 9.0 9.0  MG 1.8  --   --    Liver Function Tests:  Recent Labs Lab 06/13/13 1010  AST 14  ALT 7  ALKPHOS 73  BILITOT 0.8  PROT 7.6  ALBUMIN 3.7    Recent Labs Lab 06/13/13 1010  LIPASE 59   CBC:  Recent Labs Lab 06/13/13 1010  06/16/13 0621 06/17/13 0545  WBC 6.7  < > 4.7 4.0  NEUTROABS 5.3  --   --   --   HGB 10.3*  < > 8.5* 7.6*  HCT 30.7*  < > 26.1* 22.5*  MCV 82.3  < > 82.6 80.9  PLT 167  < > 188 184  < > = values in this interval not displayed. CBG:  Recent Labs Lab 06/16/13 1243 06/16/13 1751 06/16/13 2226 06/17/13 0748 06/17/13 1059 06/17/13 1143  GLUCAP 206* 108* 145* 160* 39* 91   Urine Drug Screen: Drugs of Abuse     Component Value Date/Time   LABOPIA POSITIVE* 06/13/2013 1356   COCAINSCRNUR NONE DETECTED 06/13/2013 1356   LABBENZ NONE DETECTED 06/13/2013 1356   AMPHETMU NONE DETECTED 06/13/2013 1356   THCU POSITIVE* 06/13/2013  [place] 06/13/2013 1356    Alcohol Level:  Recent Labs Lab 06/13/13 1427  ETH <11   Urinalysis:  Recent Labs Lab 06/13/13 1011  COLORURINE YELLOW  LABSPEC 1.007  PHURINE 8.0  GLUCOSEU NEG  HGBUR MOD*  BILIRUBINUR NEG  KETONESUR NEG  PROTEINUR 100*  UROBILINOGEN 0.2  NITRITE POS*  LEUKOCYTESUR LARGE*   Medications: I have reviewed the patient's current medications. Scheduled Meds: . bisacodyl  10 mg Rectal Once  . enoxaparin (LOVENOX) injection  40 mg Subcutaneous Daily  . insulin aspart  0-9 Units Subcutaneous TID WC  . insulin glargine  10 Units Subcutaneous QHS  . levothyroxine  100 mcg Oral QAC breakfast  . metoCLOPramide  10 mg Oral TID AC & HS  . nitrofurantoin  100 mg Oral Q12H  . pantoprazole  40 mg Oral QHS  . polyethylene glycol  17 g Oral BID  . senna-docusate  1 tablet Oral BID   Continuous Infusions:   PRN Meds:.dextrose, oxyCODONE-acetaminophen Assessment/Plan: Angela [name] is a  45 year woman who presents to the [facility] complaining of abdominal pain, nausea, and vomiting.    Abdominal pain, nausea, and vomiting: likely secondary to UTI, pain is improving, but nausea and vomiting still present.  ?gastroparesis.  -continue macrobid--day 2 for total 5 days treatment  -Protonix  -continue bowel regimen including senokot, miralax, and dulcolax -start reglan -GC/Chalmydia  -urine preg  AKI: worsening this morning. Cr increased to 1.2 this morning.  Possibly pre-renal etiology secondary to hypovolemia since unable to tolerate diet and vomiting.  -resume IVF due to decreased po intake, NS @75cc /12h -U/A  Diabetes mellitus type I: uncontrolled, HbA1C 10.5 04/22/2013.  On home regimen: Lantus 35 units qhs,  novolog 4-12 units TID before meals.  Hypoglycemia this morning secondary to receiving 7 units of novolog this morning instead of the ordered amount of 2 units per scale. Discussed with diabetes coordinator -CBG monitoring - continue Lantus 10 units qhs - SSI-sensitive  Anemia--Hb dropped to 7.6 this morning from 10.3 on admission.  Baseline Hb ~10-11.  MCV 82.3 on admission.  Iron panel per 06/2012: Iron 49, Ferritin 81, TIBC 281.  Possibly secondary to hemolysis as a result of starting macrobid. No obvious source of bleeding.   -f/u PM Hb, transfuse <7 -haptoglobin and LDH -peripheral smear -type and screen  Hypothyroidism: Last TSH was markedly elevated at 34.002 on 04/22/13.  On home Levothyroxine 128mcg qd  - Continue home dose levothyroxine  HTN: on home regimen Lisinopril 10mg  qd.  BP on admission 145/102 in setting of severe pain and not taking her medications.     -continue to monitor -start norvasc 5mg  po qd for now, will likely resume home medication with improvement in renal function  VTE: Lovenox  Dispo: Disposition is deferred at this time, awaiting improvement of current medical problems. Anticipated discharge in approximately 1 day(s).   The patient does  have a current PCP Eliezer Lofts [place] Abts, MD) and does need an [facility] hospital follow-up appointment after discharge.  The patient does not have transportation limitations that hinder transportation to clinic appointments.  Services Needed at time of discharge: Y = Yes, Blank = No PT:   OT:   RN:   Equipment:   Other:     LOS: 4 days   Jerene Pitch, MD 06/17/2013, 12:33 PM

## 2013-06-17 NOTE — Progress Notes (Signed)
Pt blood glucose was 39 at noon this nurse administered glucose gel 1 tube and was rechecked came up to 91.

## 2013-06-17 NOTE — Progress Notes (Signed)
Inpatient Diabetes Program Recommendations  AACE/ADA: New Consensus Statement on Inpatient Glycemic Control (2013)  Target Ranges:  Prepandial:   less than 140 mg/dL      Peak postprandial:   less than 180 mg/dL (1-2 hours)      Critically ill patients:  140 - 180 mg/dL   Reason for Visit:  Results for Angela [name], Angela [name] (MRN [id number]) as of 06/17/2013 15:14  Ref. Range 06/16/2013 22:26 06/17/2013 05:45 06/17/2013 07:48 06/17/2013 10:59 06/17/2013 11:43  Glucose-Capillary Latest Range: 70-99 mg/dL 145 (H)  160 (H) 39 (LL) 91   Note history of Type 1 Diabetes.  Patient is currently receiving Lantus 10 units daily and sensitive correction.  It appears that patient received Novolog 7 units for a CBG of 160 mg/dL this morning.  According to order, patient should have only received 2 units of Novolog insulin.  Called and discussed with MD-Dr. [name].  Will follow.

## 2013-06-18 DIAGNOSIS — E109 Type 1 diabetes mellitus without complications: Secondary | ICD-10-CM

## 2013-06-18 DIAGNOSIS — N10 Acute tubulo-interstitial nephritis: Principal | ICD-10-CM

## 2013-06-18 DIAGNOSIS — E039 Hypothyroidism, unspecified: Secondary | ICD-10-CM

## 2013-06-18 LAB — CBC
MCV: 80.5 fL (ref 78.0–100.0)
Platelets: 203 10*3/uL (ref 150–400)
RBC: 2.97 MIL/uL — ABNORMAL LOW (ref 3.87–5.11)
WBC: 4.4 10*3/uL (ref 4.0–10.5)

## 2013-06-18 LAB — BASIC METABOLIC PANEL
CO2: 25 mEq/L (ref 19–32)
Calcium: 9.2 mg/dL (ref 8.4–10.5)
GFR calc Af Amer: 77 mL/min — ABNORMAL LOW (ref >90)
Sodium: 136 mEq/L (ref 135–145)

## 2013-06-18 MED ORDER — NITROFURANTOIN MONOHYD MACRO 100 MG PO CAPS: 100.0000 mg | ORAL_CAPSULE | Freq: Two times a day (BID) | ORAL | Status: AC

## 2013-06-18 MED ORDER — SENNOSIDES-DOCUSATE SODIUM 8.6-50 MG PO TABS: 1.0000 | ORAL_TABLET | Freq: Two times a day (BID) | ORAL | Status: DC

## 2013-06-18 MED ORDER — NITROFURANTOIN MONOHYD MACRO 100 MG PO CAPS
100.0000 mg | ORAL_CAPSULE | Freq: Once | ORAL | Status: DC
  Filled 2013-06-18: qty 1

## 2013-06-18 NOTE — Progress Notes (Signed)
Cosigned by Dayna Ramus RN

## 2013-06-18 NOTE — Progress Notes (Signed)
Subjective: Ms. Fondren was resting comfortably when I saw her. She reports resolution of abdominal pain and N/V, last episode of emesis was early yesterday AM. She tolerated breakfast this AM. No other complaints today. No other episodes of hypoglycemia overnight. She feels ready to return home.    Objective: Vital signs in last 24 hours: Filed Vitals:   06/17/13 1358 06/17/13 1611 06/17/13 2146 06/18/13 0548  BP: 155/102 150/98 136/85 126/86  Pulse: 96 84 60 99  Temp: 98.4 F (36.9 C) 98 F (36.7 C) 98.6 F (37 C) 98.5 F (36.9 C)  TempSrc: Oral Oral Oral   Resp: 15 15 17 17   Height:      Weight:      SpO2: 100% 96% 97% 97%    Intake/Output Summary (Last 24 hours) at 06/18/13 1206 Last data filed at 06/18/13 0200  Gross per 24 hour  Intake      0 ml  Output      0 ml  Net      0 ml  Vitals reviewed. General: resting in bed, NAD HEENT: PERRL, EOMI, no scleral icterus Cardiac: RRR, no rubs, murmurs or gallops Pulm: clear to auscultation bilaterally, no wheezes, rales, or rhonchi Abd: soft, no TTP, nondistended, BS present Ext: warm and well perfused, no pedal edema, +2dp b/l Neuro: alert and oriented X3, cranial nerves II-XII grossly intact, strength and sensation to light touch equal in bilateral upper and lower extremities  Lab Results: Basic Metabolic Panel:  Recent Labs Lab 06/15/13 0510  06/17/13 0545 06/18/13 0600  NA 141  < > 137 136  K 3.8  < > 3.9 3.8  CL 107  < > 104 102  CO2 28  < > 26 25  GLUCOSE 83  < > 212* 145*  BUN 5*  < > 7 6  CREATININE 1.12*  < > 1.20* 1.12*  CALCIUM 8.8  < > 9.0 9.2  MG 1.8  --   --   --   < > = values in this interval not displayed. Liver Function Tests:  Recent Labs Lab 06/13/13 1010  AST 14  ALT 7  ALKPHOS 73  BILITOT 0.8  PROT 7.6  ALBUMIN 3.7    Recent Labs Lab 06/13/13 1010  LIPASE 59   CBC:  Recent Labs Lab 06/13/13 1010  06/17/13 1900 06/18/13 0600  WBC 6.7  < > 5.3 4.4  NEUTROABS 5.3  --    --   --   HGB 10.3*  < > 8.3* 8.1*  HCT 30.7*  < > 25.3* 23.9*  MCV 82.3  < > 81.4 80.5  PLT 167  < > 226 203  < > = values in this interval not displayed. CBG:  Recent Labs Lab 06/17/13 1059 06/17/13 1143 06/17/13 1700 06/17/13 2143 06/18/13 0734 06/18/13 1138  GLUCAP 39* 91 171* 150* 134* 208*   Urine Drug Screen: Drugs of Abuse     Component Value Date/Time   LABOPIA POSITIVE* 06/13/2013 1356   COCAINSCRNUR NONE DETECTED 06/13/2013 1356   LABBENZ NONE DETECTED 06/13/2013 1356   AMPHETMU NONE DETECTED 06/13/2013 1356   THCU POSITIVE* 06/13/2013 1356   LABBARB NONE DETECTED 06/13/2013 1356    Alcohol Level:  Recent Labs Lab 06/13/13 1427  ETH <11   Urinalysis:  Recent Labs Lab 06/13/13 1011 06/17/13 1732  COLORURINE YELLOW YELLOW  LABSPEC 1.007 1.007  PHURINE 8.0 6.5  GLUCOSEU NEG NEGATIVE  HGBUR MOD* SMALL*  BILIRUBINUR NEG NEGATIVE  KETONESUR NEG  NEGATIVE  PROTEINUR 100* 100*  UROBILINOGEN 0.2 0.2  NITRITE POS* NEGATIVE  LEUKOCYTESUR LARGE* NEGATIVE   Medications: I have reviewed the patient's current medications. Scheduled Meds: . amLODipine  5 mg Oral Daily  . bisacodyl  10 mg Rectal Once  . enoxaparin (LOVENOX) injection  40 mg Subcutaneous Daily  . insulin aspart  0-9 Units Subcutaneous TID WC  . insulin glargine  10 Units Subcutaneous QHS  . levothyroxine  100 mcg Oral QAC breakfast  . metoCLOPramide  10 mg Oral TID AC & HS  . pantoprazole  40 mg Oral QHS  . polyethylene glycol  17 g Oral BID  . senna-docusate  1 tablet Oral BID   Continuous Infusions:   PRN Meds:.dextrose, oxyCODONE-acetaminophen Assessment/Plan: Ms. Matzke is a 58 year woman who presents to the [facility] complaining of abdominal pain, nausea, and vomiting.    Abdominal pain, nausea, and vomiting: likely secondary to UTI, pain has resolved. Likely an element of diabetic gastroparesis present.  -continue macrobid--day 3 for total 5 days treatment  -Protonix  -continue bowel  regimen including senokot, miralax, and dulcolax -started reglan 06/17/13 -GC/Chalmydia  -urine preg negative  AKI: worsening this morning. Cr is trending down, today Cr is 1.12 from 1.2 yesterday.FENa <1, likely prerenal and resolving after patient stopped vomiting and taking PO.   -received NS @75cc /12h yesterday, completed -U/A showed no WBC, 100 protein, small Hgb  Diabetes mellitus type I: uncontrolled, HbA1C 10.5 04/22/2013.  On home regimen: Lantus 35 units qhs,  novolog 4-12 units TID before meals.  Hypoglycemia yesterday, no other episodes. Discussed with diabetes coordinator. -CBG monitoring - continue Lantus 10 units qhs - SSI-sensitive  Anemia--Hb dropped to 7.6 yesterday, likely dilutional, but increased to 8.3 today. Hgb had been 10.3 on admission.  Baseline Hb ~10-11.  MCV 82.3 on admission.  Iron panel per 06/2012: Iron 49, Ferritin 81, TIBC 281.  Possibly secondary to hemolysis as a result of starting macrobid. No obvious source of bleeding.   -f/u PM Hb, transfuse <7 -haptoglobin 261 and LDH 154  Hypothyroidism: Last TSH was markedly elevated at 34.002 on 04/22/13.  On home Levothyroxine 110mcg qd ? compliance - Continue home dose levothyroxine  HTN: on home regimen Lisinopril 10mg  qd.  BP on admission 145/102 in setting of severe pain and not taking her medications.     -continue to monitor- today BP 126/96 -start norvasc 5mg  po qd for now, will likely resume home medication with improvement in renal function  VTE: Lovenox  Dispo: Discharge to home today.  The patient does have a current PCP Eliezer Lofts Melbourne Abts, MD) and does need an [facility] hospital follow-up appointment after discharge.  The patient does not have transportation limitations that hinder transportation to clinic appointments.  Services Needed at time of discharge: Y = Yes, Blank = No PT:   OT:   RN:   Equipment:   Other:     LOS: 5 days   Rebecca Eaton, MD 06/18/2013, 12:06 PM

## 2013-06-18 NOTE — Discharge Summary (Signed)
Name: Angela [name] MRN: [id number] DOB: 1986-09-17 27 y.o. PCP: Juluis Mire, MD  Date of Admission: 06/13/2013 12:00 PM Date of Discharge: 06/18/2013 Attending Physician: Karren Cobble, MD  Discharge Diagnosis: 1. Acute Pyelonephritis - pyuria on UA, treated with ceftriaxone -> macrobid 2. Abdominal pain - likely included pyelo, possible diabetic gastroparesis, and constipation 3. Diabetes Mellitus Type I - A1C = 10.5 4. Hypothyroidism - non-compliant with Synthroid 5. Acute Kidney Injury - improving 6. Hypertension 7. Normocytic Anemia  Discharge Medications:   Medication List    STOP taking these medications       ibuprofen 200 MG tablet  Commonly known as:  ADVIL,MOTRIN      TAKE these medications       albuterol 108 (90 BASE) MCG/ACT inhaler  Commonly known as:  PROVENTIL HFA;VENTOLIN HFA  Inhale 1-2 puffs into the lungs every 6 (six) hours as needed for wheezing.     citalopram 20 MG tablet  Commonly known as:  CELEXA  Take 1 tablet (20 mg total) by mouth daily.     famotidine 20 MG tablet  Commonly known as:  PEPCID  Take 20 mg by mouth daily.     gabapentin 100 MG capsule  Commonly known as:  NEURONTIN  Take 1 capsule (100 mg total) by mouth 3 (three) times daily.     insulin aspart 100 UNIT/ML injection  Commonly known as:  novoLOG  Inject 4-12 Units into the skin 3 (three) times daily before meals. Based on sliding scale - pt has chart at home     insulin glargine 100 UNIT/ML injection  Commonly known as:  LANTUS  Inject 35 Units into the skin at bedtime.     levothyroxine 100 MCG tablet  Commonly known as:  SYNTHROID, LEVOTHROID  Take 1 tablet (100 mcg total) by mouth daily before breakfast.     lisinopril 10 MG tablet  Commonly known as:  PRINIVIL,ZESTRIL  Take 1 tablet (10 mg total) by mouth daily.     lovastatin 20 MG tablet  Commonly known as:  MEVACOR  Take 1 tablet (20 mg total) by mouth at bedtime.     metoCLOPramide 10 MG  tablet  Commonly known as:  REGLAN  Take 1 tablet (10 mg total) by mouth 3 (three) times daily with meals.     nitrofurantoin (macrocrystal-monohydrate) 100 MG capsule  Commonly known as:  MACROBID  Take 1 capsule (100 mg total) by mouth 2 (two) times daily.     senna-docusate 8.6-50 MG per tablet  Commonly known as:  Senokot-S  Take 1 tablet by mouth 2 (two) times daily. For constipation        Disposition and follow-up:   AngelaAngela [name] was discharged from [facility] in stable and improved  At the hospital follow up visit please address:  1.  Please assess glucose control.  Please assess compliance with insulin, Synthroid, and macrobid (to complete course 7/7)  2.  Labs / imaging needed at time of follow-up: BMET (to ensure resolution of AKI)  3.  Pending labs/ test needing follow-up: None  Follow-up Appointments: Follow-up Information   Follow up with Cresenciano Genre, MD On 06/25/2013. (2:15 pm)    Contact information:   [address] [place] [address] [phone]       Discharge Instructions: Discharge Orders   Future Orders Complete By Expires     Call MD for:  persistant nausea and vomiting  As directed  Call MD for:  severe uncontrolled pain  As directed     Call MD for:  temperature >100.4  As directed     Diet Carb Modified  As directed     Increase activity slowly  As directed        Consultations: None  Procedures Performed:  US Abdomen Complete  06/14/2013   *RADIOLOGY REPORT*  Clinical Data:  Generalized abdominal pain.  COMPLETE ABDOMINAL ULTRASOUND  Comparison:  Urinary tract ultrasound 10/02/2008.  CT abdomen 05/01/2008.  Findings:  Gallbladder:  No shadowing gallstones or echogenic sludge.  No gallbladder wall thickening or pericholecystic fluid.  Negative sonographic Murphy's sign according to the ultrasound technologist.  Common bile duct:  Normal in caliber with maximum diameter approximating 5 mm.   Liver:  No focal mass lesion seen.  Within normal limits in parenchymal echogenicity.  IVC:  Patent.  Pancreas:  Although the pancreas is difficult to visualize in its entirety, no focal pancreatic abnormality is identified.  Spleen:  Normal size and echotexture without focal parenchymal abnormality.  Right Kidney:  No hydronephrosis.  Well-preserved cortex.  No shadowing calculi.  Normal size and parenchymal echotexture without focal abnormalities.  Approximately 11.4 cm in length.  Left Kidney:  No hydronephrosis.  Well-preserved cortex.  No shadowing calculi.  Normal size and parenchymal echotexture without focal abnormalities.  Approximately 9.8 cm in length.  Abdominal aorta:  Normal in caliber throughout its visualized course in the abdomen without significant atherosclerosis.  IMPRESSION: Normal examination.   Original Report Authenticated By: Evangeline Dakin, M.D.    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 She states that the pain has been there for about a week and has gotten progressively worse. She states that she has been nauseated and has been vomiting anything she has attempted to eat for the last 3 days. She denies fevers, diarrhea, blood in the vomit, or blood in her stool. She states that she has never had anything like this before. She states that the pain seems to start in her left flank and move across to the epigastric area. She states that any movement makes it worse. The pain is a sharp stabbing pain. She states that she hasn't been able to keep down her medications but has continued to take her insulin. She also mentions that she feels like she is urinating more often but denies dysuria and urgency.  She also states that she hasn't had a BM in over a week.    Hospital Course by problem list: 1. Acute Pyelonephritis - The patient presented with left flank pain, nausea, and vomiting, with initial UA showing TNTC WBC's, suspicious for pyelonephritis.  The patient was started on ceftriaxone, with improvement in symptoms, later transitioned to PO nitrofurantoin.  Interestingly, the patient's urine culture returned with no growth.  The patient was also tested for GC/Chlamydia, which was negative, though the specimen was obtained after starting antibiotics.  The patient will complete a total of 2 weeks of antibiotics for her pyelonephritis.  2. Abdominal pain - The patient presented with significant abdominal pain, nausea, and vomiting, initially thought to be due to pyelonephritis.  As her abdominal pain did not resolve with the rest of her symptoms, other causes such as diabetic gastroparesis and  constipation were considered.  She was given stool softeners, and was able to have a bowel movement.  She was also started on a trial of reglan.  By hospital discharge, she had resolution of her abdominal pain.    3. Diabetes Mellitus Type I - The patient has a history of poorly-controlled DM1, with a history of medication non-compliance.  Her last A1C was 10.5.  During her hospitalization, she had a few episodes of hypoglycemia, due to decreased PO intake.  By hospital discharge, she was tolerating a regular diet.  She was discharged on her home regimen of Lantus and Novolog, and will follow-up in the outpatient setting.  4. Hypothyroidism - The patient has a history of hypothyroidism, non-compliant with Synthroid.  Her last TSH was 34, though this has varied widely over the last few years.  The patient was counseled on the importance of compliance with this medication.  She is to continue her current dose of 100 mcg/day.  5. Acute Kidney Injury - The patient has an acute elevation in creatinine to 1.2, elevated  from her baseline around 0.8.  This was thought to be pre-renal due to decreased PO intake, with a FeNa < 1%.  The patient creatinine improved with IV fluids and increased oral intake to 1.1 by hospital discharge.  6. Hypertension - the patient has a history of HTN.  Her lisinopril was held during her hospitalization due to AKI, but was restarted at hospital discharge.  7. Normocytic Anemia - the patient has a history of normocytic anemia.  Her prior hemoglobin baseline was around 10-12, though her last value prior to hospitalization was 8.9.  Her Hb was initially elevated, thought to represent hemoconcentration, then lowered to the 8's, where it remained throughout her hospitalization.  LDH was normal, and haptoglobin was high, making hemolysis unlikely.   Discharge Vitals:   BP 126/86  Pulse 99  Temp(Src) 98.5 F (36.9 C) (Oral)  Resp 17  Ht 5\' 5"  (1.651 m)  Wt 158 lb 1.6 oz (71.714 kg)  BMI 26.31 kg/m2  SpO2 97%  LMP 05/17/2013  Discharge Labs:  Results for orders placed during the hospital encounter of 06/13/13 (from the past 24 hour(s))  TYPE AND SCREEN     Status: None   Collection Time    06/17/13  2:20 PM      Result Value Range   ABO/RH(D) O POS     Antibody Screen NEG     Sample Expiration 06/20/2013    GLUCOSE, CAPILLARY     Status: Abnormal   Collection Time    06/17/13  5:00 PM      Result Value Range   Glucose-Capillary 171 (*) 70 - 99 mg/dL  URINALYSIS, ROUTINE W REFLEX MICROSCOPIC     Status: Abnormal   Collection Time    06/17/13  5:32 PM      Result Value Range   Color, Urine YELLOW  YELLOW   APPearance CLEAR  CLEAR   Specific Gravity, Urine 1.007  1.005 - 1.030   pH 6.5  5.0 - 8.0   Glucose, UA NEGATIVE  NEGATIVE mg/dL   Hgb urine dipstick SMALL (*) NEGATIVE   Bilirubin Urine NEGATIVE  NEGATIVE   Ketones, ur NEGATIVE  NEGATIVE mg/dL   Protein, ur 100 (*) NEGATIVE mg/dL   Urobilinogen, UA 0.2  0.0 - 1.0 mg/dL   Nitrite NEGATIVE  NEGATIVE   Leukocytes,  UA NEGATIVE  NEGATIVE  PREGNANCY, URINE     Status: None  Collection Time    06/17/13  5:32 PM      Result Value Range   Preg Test, Ur NEGATIVE  NEGATIVE  GC/CHLAMYDIA PROBE AMP     Status: None   Collection Time    06/17/13  5:32 PM      Result Value Range   CT Probe RNA NEGATIVE  NEGATIVE   GC Probe RNA NEGATIVE  NEGATIVE  URINE MICROSCOPIC-ADD ON     Status: None   Collection Time    06/17/13  5:32 PM      Result Value Range   Squamous Epithelial / LPF RARE  RARE   WBC, UA 0-2  <3 WBC/hpf   RBC / HPF 3-6  <3 RBC/hpf   Bacteria, UA RARE  RARE   Urine-Other MUCOUS PRESENT    CBC     Status: Abnormal   Collection Time    06/17/13  7:00 PM      Result Value Range   WBC 5.3  4.0 - 10.5 K/uL   RBC 3.11 (*) 3.87 - 5.11 MIL/uL   Hemoglobin 8.3 (*) 12.0 - 15.0 g/dL   HCT 25.3 (*) 36.0 - 46.0 %   MCV 81.4  78.0 - 100.0 fL   MCH 26.7  26.0 - 34.0 pg   MCHC 32.8  30.0 - 36.0 g/dL   RDW 12.2  11.5 - 15.5 %   Platelets 226  150 - 400 K/uL  HAPTOGLOBIN     Status: Abnormal   Collection Time    06/17/13  7:00 PM      Result Value Range   Haptoglobin 261 (*) 45 - 215 mg/dL  LACTATE DEHYDROGENASE     Status: None   Collection Time    06/17/13  7:00 PM      Result Value Range   LDH 154  94 - 250 U/L  GLUCOSE, CAPILLARY     Status: Abnormal   Collection Time    06/17/13  9:43 PM      Result Value Range   Glucose-Capillary 150 (*) 70 - 99 mg/dL   Comment 1 Notify RN     Comment 2 Documented in Chart    BASIC METABOLIC PANEL     Status: Abnormal   Collection Time    06/18/13  6:00 AM      Result Value Range   Sodium 136  135 - 145 mEq/L   Potassium 3.8  3.5 - 5.1 mEq/L   Chloride 102  96 - 112 mEq/L   CO2 25  19 - 32 mEq/L   Glucose, Bld 145 (*) 70 - 99 mg/dL   BUN 6  6 - 23 mg/dL   Creatinine, Ser 1.12 (*) 0.50 - 1.10 mg/dL   Calcium 9.2  8.4 - 10.5 mg/dL   GFR calc non Af Amer 66 (*) >90 mL/min   GFR calc Af Amer 77 (*) >90 mL/min  CBC     Status: Abnormal    Collection Time    06/18/13  6:00 AM      Result Value Range   WBC 4.4  4.0 - 10.5 K/uL   RBC 2.97 (*) 3.87 - 5.11 MIL/uL   Hemoglobin 8.1 (*) 12.0 - 15.0 g/dL   HCT 23.9 (*) 36.0 - 46.0 %   MCV 80.5  78.0 - 100.0 fL   MCH 27.3  26.0 - 34.0 pg   MCHC 33.9  30.0 - 36.0 g/dL   RDW 12.0  11.5 - 15.5 %  Platelets 203  150 - 400 K/uL  GLUCOSE, CAPILLARY     Status: Abnormal   Collection Time    06/18/13  7:34 AM      Result Value Range   Glucose-Capillary 134 (*) 70 - 99 mg/dL  GLUCOSE, CAPILLARY     Status: Abnormal   Collection Time    06/18/13 11:38 AM      Result Value Range   Glucose-Capillary 208 (*) 70 - 99 mg/dL    Signed: Hester Mates, MD 06/18/2013, 12:46 PM   Time Spent on Discharge: 45 minutes Services Ordered on Discharge: None Equipment Ordered on Discharge: None

## 2013-06-18 NOTE — Progress Notes (Signed)
Internal Medicine Attending  Date: 06/18/2013  Patient name: Angela [name] Medical record number: [id number] Date of birth: 01/22/1986 Age: 27 y.o. Gender: female  I saw and evaluated the patient. I reviewed the resident's note by Dr. Mechele Claude and I agree with the resident's findings and plans as documented in her progress note.  Her nausea, vomiting, and abdominal pain from the presumed pyelonephritis have resolved with antibiotic therapy and she is stable for discharge home with follow-up in the [facility].

## 2013-06-19 NOTE — Progress Notes (Signed)
Discharged instructions reviewed with patient, including times for next dose of antibiotic.Patient verbalizes understanding. Printed AVS given to patient. Patient discharged to home.

## 2013-06-25 ENCOUNTER — Encounter: Payer: Self-pay | Admitting: Internal Medicine

## 2013-06-27 ENCOUNTER — Other Ambulatory Visit: Payer: Self-pay

## 2013-09-08 ENCOUNTER — Encounter (HOSPITAL_COMMUNITY): Payer: Self-pay | Admitting: Emergency Medicine

## 2013-09-08 ENCOUNTER — Observation Stay (HOSPITAL_COMMUNITY)
Admission: EM | Admit: 2013-09-08 | Discharge: 2013-09-10 | Disposition: A | Discharge status: Home / Self Care | Payer: Self-pay | Attending: Internal Medicine | Admitting: Internal Medicine

## 2013-09-08 ENCOUNTER — Emergency Department (HOSPITAL_COMMUNITY): Payer: Self-pay

## 2013-09-08 DIAGNOSIS — D649 Anemia, unspecified: Secondary | ICD-10-CM

## 2013-09-08 DIAGNOSIS — E1065 Type 1 diabetes mellitus with hyperglycemia: Secondary | ICD-10-CM | POA: Insufficient documentation

## 2013-09-08 DIAGNOSIS — J45909 Unspecified asthma, uncomplicated: Secondary | ICD-10-CM | POA: Insufficient documentation

## 2013-09-08 DIAGNOSIS — K3184 Gastroparesis: Secondary | ICD-10-CM | POA: Insufficient documentation

## 2013-09-08 DIAGNOSIS — I10 Essential (primary) hypertension: Secondary | ICD-10-CM | POA: Insufficient documentation

## 2013-09-08 DIAGNOSIS — E890 Postprocedural hypothyroidism: Secondary | ICD-10-CM | POA: Insufficient documentation

## 2013-09-08 DIAGNOSIS — Z91199 Patient's noncompliance with other medical treatment and regimen due to unspecified reason: Secondary | ICD-10-CM | POA: Insufficient documentation

## 2013-09-08 DIAGNOSIS — E1059 Type 1 diabetes mellitus with other circulatory complications: Secondary | ICD-10-CM | POA: Insufficient documentation

## 2013-09-08 DIAGNOSIS — E875 Hyperkalemia: Secondary | ICD-10-CM

## 2013-09-08 DIAGNOSIS — E871 Hypo-osmolality and hyponatremia: Secondary | ICD-10-CM | POA: Insufficient documentation

## 2013-09-08 DIAGNOSIS — E785 Hyperlipidemia, unspecified: Secondary | ICD-10-CM | POA: Insufficient documentation

## 2013-09-08 DIAGNOSIS — E1029 Type 1 diabetes mellitus with other diabetic kidney complication: Secondary | ICD-10-CM | POA: Diagnosis present

## 2013-09-08 DIAGNOSIS — E162 Hypoglycemia, unspecified: Secondary | ICD-10-CM

## 2013-09-08 DIAGNOSIS — IMO0002 Reserved for concepts with insufficient information to code with codable children: Secondary | ICD-10-CM | POA: Insufficient documentation

## 2013-09-08 DIAGNOSIS — K219 Gastro-esophageal reflux disease without esophagitis: Secondary | ICD-10-CM | POA: Insufficient documentation

## 2013-09-08 DIAGNOSIS — Z794 Long term (current) use of insulin: Secondary | ICD-10-CM | POA: Insufficient documentation

## 2013-09-08 DIAGNOSIS — Z9119 Patient's noncompliance with other medical treatment and regimen: Secondary | ICD-10-CM | POA: Insufficient documentation

## 2013-09-08 DIAGNOSIS — T38801A Poisoning by unspecified hormones and synthetic substitutes, accidental (unintentional), initial encounter: Secondary | ICD-10-CM | POA: Insufficient documentation

## 2013-09-08 DIAGNOSIS — Z79899 Other long term (current) drug therapy: Secondary | ICD-10-CM | POA: Insufficient documentation

## 2013-09-08 DIAGNOSIS — N179 Acute kidney failure, unspecified: Secondary | ICD-10-CM | POA: Insufficient documentation

## 2013-09-08 DIAGNOSIS — E10649 Type 1 diabetes mellitus with hypoglycemia without coma: Secondary | ICD-10-CM | POA: Diagnosis present

## 2013-09-08 DIAGNOSIS — I798 Other disorders of arteries, arterioles and capillaries in diseases classified elsewhere: Secondary | ICD-10-CM | POA: Insufficient documentation

## 2013-09-08 DIAGNOSIS — T383X1A Poisoning by insulin and oral hypoglycemic [antidiabetic] drugs, accidental (unintentional), initial encounter: Principal | ICD-10-CM | POA: Insufficient documentation

## 2013-09-08 DIAGNOSIS — E1049 Type 1 diabetes mellitus with other diabetic neurological complication: Secondary | ICD-10-CM | POA: Insufficient documentation

## 2013-09-08 DIAGNOSIS — E039 Hypothyroidism, unspecified: Secondary | ICD-10-CM | POA: Diagnosis present

## 2013-09-08 DIAGNOSIS — R4182 Altered mental status, unspecified: Secondary | ICD-10-CM

## 2013-09-08 LAB — RAPID URINE DRUG SCREEN, HOSP PERFORMED
Amphetamines: NOT DETECTED
Barbiturates: NOT DETECTED
Benzodiazepines: NOT DETECTED
Opiates: NOT DETECTED
Tetrahydrocannabinol: POSITIVE — AB

## 2013-09-08 LAB — URINALYSIS, ROUTINE W REFLEX MICROSCOPIC
Glucose, UA: 250 mg/dL — AB
Leukocytes, UA: NEGATIVE
Protein, ur: 100 mg/dL — AB
pH: 7.5 (ref 5.0–8.0)

## 2013-09-08 LAB — CBC WITH DIFFERENTIAL/PLATELET
Basophils Absolute: 0 10*3/uL (ref 0.0–0.1)
Basophils Relative: 0 % (ref 0–1)
Eosinophils Relative: 1 % (ref 0–5)
HCT: 30.7 % — ABNORMAL LOW (ref 36.0–46.0)
MCHC: 33.9 g/dL (ref 30.0–36.0)
MCV: 82.1 fL (ref 78.0–100.0)
Monocytes Absolute: 0.4 10*3/uL (ref 0.1–1.0)
Neutro Abs: 1.8 10*3/uL (ref 1.7–7.7)
RDW: 12.6 % (ref 11.5–15.5)

## 2013-09-08 LAB — CREATININE, SERUM
Creatinine, Ser: 1.57 mg/dL — ABNORMAL HIGH (ref 0.50–1.10)
GFR calc Af Amer: 51 mL/min — ABNORMAL LOW (ref >90)
GFR calc non Af Amer: 44 mL/min — ABNORMAL LOW (ref >90)

## 2013-09-08 LAB — CBC
Hemoglobin: 10.6 g/dL — ABNORMAL LOW (ref 12.0–15.0)
MCHC: 33.2 g/dL (ref 30.0–36.0)
RBC: 3.9 MIL/uL (ref 3.87–5.11)
WBC: 7.5 10*3/uL (ref 4.0–10.5)

## 2013-09-08 LAB — COMPREHENSIVE METABOLIC PANEL
AST: 21 U/L (ref 0–37)
Albumin: 3.4 g/dL — ABNORMAL LOW (ref 3.5–5.2)
Calcium: 9.9 mg/dL (ref 8.4–10.5)
Creatinine, Ser: 1.55 mg/dL — ABNORMAL HIGH (ref 0.50–1.10)

## 2013-09-08 LAB — GLUCOSE, CAPILLARY
Glucose-Capillary: 135 mg/dL — ABNORMAL HIGH (ref 70–99)
Glucose-Capillary: 36 mg/dL — CL (ref 70–99)
Glucose-Capillary: 66 mg/dL — ABNORMAL LOW (ref 70–99)
Glucose-Capillary: 66 mg/dL — ABNORMAL LOW (ref 70–99)
Glucose-Capillary: 147 mg/dL — ABNORMAL HIGH (ref 70–99)
Glucose-Capillary: 182 mg/dL — ABNORMAL HIGH (ref 70–99)
Glucose-Capillary: 197 mg/dL — ABNORMAL HIGH (ref 70–99)
Glucose-Capillary: 201 mg/dL — ABNORMAL HIGH (ref 70–99)
Glucose-Capillary: 246 mg/dL — ABNORMAL HIGH (ref 70–99)
Glucose-Capillary: 306 mg/dL — ABNORMAL HIGH (ref 70–99)
Glucose-Capillary: 324 mg/dL — ABNORMAL HIGH (ref 70–99)

## 2013-09-08 LAB — POCT I-STAT 3, VENOUS BLOOD GAS (G3P V)
O2 Saturation: 91 %
pO2, Ven: 65 mmHg — ABNORMAL HIGH (ref 30.0–45.0)

## 2013-09-08 LAB — SALICYLATE LEVEL: Salicylate Lvl: <2 mg/dL — ABNORMAL LOW (ref 2.8–20.0)

## 2013-09-08 LAB — URINE MICROSCOPIC-ADD ON

## 2013-09-08 LAB — TROPONIN I: Troponin I: <0.3 ng/mL (ref <0.30)

## 2013-09-08 LAB — TSH: TSH: 141.239 u[IU]/mL — ABNORMAL HIGH (ref 0.350–4.500)

## 2013-09-08 LAB — AMMONIA: Ammonia: 24 umol/L (ref 11–60)

## 2013-09-08 LAB — CK: Total CK: 113 U/L (ref 7–177)

## 2013-09-08 MED ORDER — ONDANSETRON HCL 4 MG/2ML IJ SOLN
4.0000 mg | Freq: Four times a day (QID) | INTRAMUSCULAR | Status: DC | PRN
  Administered 2013-09-09: 4 mg via INTRAVENOUS
  Filled 2013-09-08: qty 2

## 2013-09-08 MED ORDER — DEXTROSE 5 % AND 0.45 % NACL IV BOLUS
1000.0000 mL | Freq: Once | INTRAVENOUS | Status: AC
  Administered 2013-09-08: 1000 mL via INTRAVENOUS

## 2013-09-08 MED ORDER — DEXTROSE 50 % IV SOLN
50.0000 mL | Freq: Once | INTRAVENOUS | Status: AC
  Administered 2013-09-08: 50 mL via INTRAVENOUS

## 2013-09-08 MED ORDER — NALOXONE HCL 0.4 MG/ML IJ SOLN
INTRAMUSCULAR | Status: AC
  Filled 2013-09-08: qty 1

## 2013-09-08 MED ORDER — HEPARIN SODIUM (PORCINE) 5000 UNIT/ML IJ SOLN
5000.0000 [IU] | Freq: Three times a day (TID) | INTRAMUSCULAR | Status: DC
  Administered 2013-09-08 – 2013-09-10 (×5): 5000 [IU] via SUBCUTANEOUS
  Filled 2013-09-08 (×8): qty 1

## 2013-09-08 MED ORDER — ALBUTEROL SULFATE HFA 108 (90 BASE) MCG/ACT IN AERS
1.0000 | INHALATION_SPRAY | Freq: Four times a day (QID) | RESPIRATORY_TRACT | Status: DC | PRN
  Filled 2013-09-08: qty 6.7

## 2013-09-08 MED ORDER — INSULIN ASPART 100 UNIT/ML ~~LOC~~ SOLN
0.0000 [IU] | Freq: Three times a day (TID) | SUBCUTANEOUS | Status: DC
  Administered 2013-09-09: 5 [IU] via SUBCUTANEOUS

## 2013-09-08 MED ORDER — POTASSIUM CHLORIDE CRYS ER 20 MEQ PO TBCR
40.0000 meq | EXTENDED_RELEASE_TABLET | Freq: Once | ORAL | Status: AC
  Administered 2013-09-08: 40 meq via ORAL
  Filled 2013-09-08: qty 2

## 2013-09-08 MED ORDER — LEVOTHYROXINE SODIUM 100 MCG PO TABS
100.0000 ug | ORAL_TABLET | Freq: Every day | ORAL | Status: DC
  Administered 2013-09-09 – 2013-09-10 (×2): 100 ug via ORAL
  Filled 2013-09-08 (×3): qty 1

## 2013-09-08 MED ORDER — DEXTROSE 50 % IV SOLN
INTRAVENOUS | Status: AC
  Filled 2013-09-08: qty 50

## 2013-09-08 MED ORDER — DEXTROSE 50 % IV SOLN: 50.0000 mL | Freq: Once | INTRAVENOUS | Status: AC | PRN

## 2013-09-08 MED ORDER — SODIUM CHLORIDE 0.9 % IV SOLN
INTRAVENOUS | Status: DC
  Administered 2013-09-08 – 2013-09-10 (×2): via INTRAVENOUS

## 2013-09-08 MED ORDER — SODIUM CHLORIDE 0.9 % IJ SOLN: 3.0000 mL | Freq: Two times a day (BID) | INTRAMUSCULAR | Status: DC

## 2013-09-08 MED ORDER — DEXTROSE 50 % IV SOLN: 50.0000 mL | Freq: Once | INTRAVENOUS | Status: AC

## 2013-09-08 MED ORDER — DEXTROSE 50 % IV SOLN
INTRAVENOUS | Status: AC
  Administered 2013-09-08: 50 mL via INTRAVENOUS
  Filled 2013-09-08: qty 50

## 2013-09-08 MED ORDER — INFLUENZA VAC SPLIT QUAD 0.5 ML IM SUSP
0.5000 mL | INTRAMUSCULAR | Status: AC
  Filled 2013-09-08: qty 0.5

## 2013-09-08 MED ORDER — DEXTROSE 50 % IV SOLN: 25.0000 mL | Freq: Once | INTRAVENOUS | Status: AC | PRN

## 2013-09-08 MED ORDER — ACETAMINOPHEN 500 MG PO TABS
500.0000 mg | ORAL_TABLET | ORAL | Status: DC | PRN
  Administered 2013-09-08 – 2013-09-10 (×4): 500 mg via ORAL
  Filled 2013-09-08 (×4): qty 1

## 2013-09-08 MED ORDER — PANTOPRAZOLE SODIUM 40 MG PO TBEC
40.0000 mg | DELAYED_RELEASE_TABLET | Freq: Every day | ORAL | Status: DC
  Administered 2013-09-08 – 2013-09-09 (×2): 40 mg via ORAL
  Filled 2013-09-08 (×3): qty 1

## 2013-09-08 MED ORDER — DEXTROSE-NACL 5-0.45 % IV SOLN
INTRAVENOUS | Status: DC
  Administered 2013-09-08: 21:00:00 via INTRAVENOUS

## 2013-09-08 MED ORDER — NALOXONE HCL 0.4 MG/ML IJ SOLN
0.4000 mg | Freq: Once | INTRAMUSCULAR | Status: AC
  Administered 2013-09-08: 0.4 mg via INTRAVENOUS

## 2013-09-08 MED ORDER — INSULIN GLARGINE 100 UNIT/ML ~~LOC~~ SOLN
5.0000 [IU] | Freq: Every day | SUBCUTANEOUS | Status: DC
  Administered 2013-09-08: 5 [IU] via SUBCUTANEOUS
  Filled 2013-09-08 (×2): qty 0.05

## 2013-09-08 MED ORDER — ONDANSETRON HCL 4 MG/2ML IJ SOLN
4.0000 mg | Freq: Once | INTRAMUSCULAR | Status: AC
  Administered 2013-09-08: 4 mg via INTRAVENOUS
  Filled 2013-09-08: qty 2

## 2013-09-08 MED ORDER — ONDANSETRON HCL 4 MG PO TABS: 4.0000 mg | ORAL_TABLET | Freq: Four times a day (QID) | ORAL | Status: DC | PRN

## 2013-09-08 NOTE — ED Provider Notes (Signed)
CSN: [id number]     Arrival date & time 09/08/13  1030 History   First MD Initiated Contact with Patient 09/08/13 1036     Chief Complaint  Patient presents with  . Hypoglycemia   (Consider location/radiation/quality/duration/timing/severity/associated sxs/prior Treatment) Patient is a 27 y.o. female presenting with hypoglycemia. The history is provided by the patient. The history is limited by the condition of the patient and the absence of a caregiver. No language interpreter was used.  Hypoglycemia Initial blood sugar:  15 Blood sugar after intervention:  210, then 100, 72 Severity:  Moderate Onset quality:  Unable to specify Timing:  Unable to specify Progression:  Unable to specify Diabetic status:  Controlled with insulin Current diabetic therapy:  Novolog, lantus Associated symptoms: altered mental status     Past Medical History  Diagnosis Date  . HLD (hyperlipidemia)   . Depression, major     was on multiple medication before followed by psych but was lost to follow up 2-3 years ago when she go arrested, stopped multiple medications that she was on (zoloft, abilify, depakote) , never restarted it  . Anemia     baseline Hb 10-11, ferriting 53  . 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 Hypothyroidism     untreated, non compliance  . Asthma   . Hypothyroidism   . Abnormal Pap smear of cervix     ascus noted 2007  . Gastritis   . Hypertension   . DM type 1 (diabetes mellitus, type 1)     uncontrolled due to medication non compliance, DKA admission at [facility] in 2008, Dx age 63   . GERD (gastroesophageal reflux disease)   . Neuromuscular disorder     DIABETIC NEUROPATHY    Past Surgical History  Procedure Laterality Date  . Other surgical history      right foot  . Foot fusion  2006   Family History  Problem Relation Age of Onset  . Multiple sclerosis Mother   . Hypothyroidism Mother   . Hyperlipidemia Maternal Grandmother   . Hypertension Maternal  Grandfather   . Hypertension Maternal Grandmother   . Prostate cancer Maternal Grandfather   . Diabetes type I Maternal Grandfather   . Breast cancer Paternal Grandmother   . Heart disease Maternal Grandmother     unknown type  . Stroke Mother     at age 63 yo   History  Substance Use Topics  . Smoking status: Former Smoker -- 0.25 packs/day for 2 years    Types: Cigarettes    Quit date: 03/04/2013  . Smokeless tobacco: Never Used     Comment: Will let us know when she is ready.  . Alcohol Use: No   OB History   Grav Para Term Preterm Abortions TAB SAB Ect Mult Living   0 0 0 0 0 0 0 0 0 0      Review of Systems  Unable to perform ROS   Allergies  Ciprofloxacin; Sulfamethoxazole; and Trimethoprim  Home Medications   Current Outpatient Rx  Name  Route  Sig  Dispense  Refill  . albuterol (PROVENTIL HFA;VENTOLIN HFA) 108 (90 BASE) MCG/ACT inhaler   Inhalation   Inhale 1-2 puffs into the lungs every 6 (six) hours as needed for wheezing.   1 Inhaler   10   . esomeprazole (NEXIUM) 20 MG capsule   Oral   Take 20 mg by mouth daily before breakfast.         . insulin  aspart (NOVOLOG) 100 UNIT/ML injection   Subcutaneous   Inject 10-12 Units into the skin 3 (three) times daily before meals. Based on sliding scale - pt has chart at home         . insulin glargine (LANTUS) 100 UNIT/ML injection   Subcutaneous   Inject 35 Units into the skin at bedtime.   15 pen   3   . levothyroxine (SYNTHROID, LEVOTHROID) 100 MCG tablet   Oral   Take 1 tablet (100 mcg total) by mouth daily before breakfast.   30 tablet   5    BP 135/76  Pulse 92  Temp(Src) 97.6 F (36.4 C) (Oral)  Resp 16  SpO2 100% Physical Exam  Constitutional: She appears well-developed and well-nourished. She appears lethargic. No distress.  HENT:  Head: Normocephalic and atraumatic.  Mouth/Throat: No oropharyngeal exudate.  Eyes: Pupils are equal, round, and reactive to light.  Neck: Normal range  of motion. Neck supple.  Cardiovascular: Normal rate, regular rhythm and normal heart sounds.  Exam reveals no gallop and no friction rub.   No murmur heard. Pulmonary/Chest: Effort normal and breath sounds normal. No respiratory distress. She has no wheezes. She has no rales.  Abdominal: Soft. Bowel sounds are normal. She exhibits no distension and no mass. There is no tenderness. There is no rebound and no guarding.  Musculoskeletal: Normal range of motion. She exhibits no edema and no tenderness.  Neurological: She appears lethargic. GCS eye subscore is 4. GCS verbal subscore is 1. GCS motor subscore is 6.  Cannot follow commands for full neuro exam  Skin: Skin is warm and dry.  Psychiatric: She has a normal mood and affect.    ED Course  Procedures (including critical care time) Labs Review Labs Reviewed  GLUCOSE, CAPILLARY - Abnormal; Notable for the following:    Glucose-Capillary 36 (*)    All other components within normal limits  CBC WITH DIFFERENTIAL - Abnormal; Notable for the following:    WBC 3.7 (*)    RBC 3.74 (*)    Hemoglobin 10.4 (*)    HCT 30.7 (*)    All other components within normal limits  COMPREHENSIVE METABOLIC PANEL - Abnormal; Notable for the following:    Sodium 134 (*)    Potassium 3.4 (*)    Glucose, Bld 40 (*)    Creatinine, Ser 1.55 (*)    Albumin 3.4 (*)    Total Bilirubin 0.1 (*)    GFR calc non Af Amer 45 (*)    GFR calc Af Amer 52 (*)    All other components within normal limits  URINALYSIS, ROUTINE W REFLEX MICROSCOPIC - Abnormal; Notable for the following:    Glucose, UA 250 (*)    Hgb urine dipstick MODERATE (*)    Protein, ur 100 (*)    All other components within normal limits  SALICYLATE LEVEL - Abnormal; Notable for the following:    Salicylate Lvl 123456 (*)    All other components within normal limits  URINE RAPID DRUG SCREEN (HOSP PERFORMED) - Abnormal; Notable for the following:    Cocaine POSITIVE (*)    Tetrahydrocannabinol  POSITIVE (*)    All other components within normal limits  GLUCOSE, CAPILLARY - Abnormal; Notable for the following:    Glucose-Capillary 135 (*)    All other components within normal limits  GLUCOSE, CAPILLARY - Abnormal; Notable for the following:    Glucose-Capillary 66 (*)    All other components within normal limits  GLUCOSE, CAPILLARY - Abnormal; Notable for the following:    Glucose-Capillary 66 (*)    All other components within normal limits  GLUCOSE, CAPILLARY - Abnormal; Notable for the following:    Glucose-Capillary 131 (*)    All other components within normal limits  GLUCOSE, CAPILLARY - Abnormal; Notable for the following:    Glucose-Capillary 147 (*)    All other components within normal limits  GLUCOSE, CAPILLARY - Abnormal; Notable for the following:    Glucose-Capillary 182 (*)    All other components within normal limits  GLUCOSE, CAPILLARY - Abnormal; Notable for the following:    Glucose-Capillary 197 (*)    All other components within normal limits  POCT I-STAT 3, BLOOD GAS (G3P V) - Abnormal; Notable for the following:    pH, Ven 7.354 (*)    pO2, Ven 65.0 (*)    Bicarbonate 25.6 (*)    All other components within normal limits  URINE CULTURE  LACTIC ACID, PLASMA  ACETAMINOPHEN LEVEL  AMMONIA  ETHANOL  URINE MICROSCOPIC-ADD ON  CK  TROPONIN I  BLOOD GAS, VENOUS  TSH  MICROALBUMIN / CREATININE URINE RATIO   Imaging Review Ct Head Wo Contrast  09/08/2013   CLINICAL DATA:  Altered mental status. Possible overdose.  EXAM: CT HEAD WITHOUT CONTRAST  TECHNIQUE: Contiguous axial images were obtained from the base of the skull through the vertex without intravenous contrast.  COMPARISON:  04/20/2010  FINDINGS: Large mucous retention cyst in the right maxillary sinus. Brain parenchyma, ventricular system, and extra-axial space are normal. No mass effect, midline shift, or acute intracranial hemorrhage.  IMPRESSION: No acute intracranial pathology.    Electronically Signed   By: Maryclare Bean M.D.   On: 09/08/2013 13:24    11:45 AM Have spoken to friend on phone who states she was unable to arouse pt this morning.  She did not have concern for suicide attempt or ingestion, though did report pt had OD when she was 75.  No recent med changes, illnesses, life stressors, or medication non-compliance to her knowledge. Pt's glucose remains labile, level of consciousness of fluctuating. Narcan given as well as ammonia capsule, but unsure which lead to improvement. Will add CT head.     MDM   1. Altered mental status   2. Hypoglycemia    Pt is a 27 y.o. female with Pmhx as above who presents with dec LOC and hypoglycemia. Per report, ppt has CBG of 15 at home, given amp of d50 with improvement to 210, but was 72 upon arrival and 36 shortly after here.  Pt moving extremities equally, will track &  Look when named called, but not speaking.  She has no signs of trauma. Amp D50 given as well as D5 1/2 NS at rate of 50, inc to 100cc/hr with continued hypoglycemia. Pt found to have no significant findings of CT head, no UTI, no elevation in tylenol or salicylates, ETOH.  She has mild Cr elevation at 1.55 UDS w/ cocaine & marijuana.  LA not elevated. Still have concern for possible ingestion, pt will need further questioning when able to clearly answer questions.  Teaching service consulted and will admit.    1. Altered mental status   2. Hypoglycemia         Neta Ehlers, MD 09/08/13 1655

## 2013-09-08 NOTE — ED Notes (Signed)
EMS arrived on scene pt CBG 15 gave an amp of d50 cbg up to 210 before EMS arrived to ED CBG 100. CBG upon arrival 72. Pt continues to be unresponsive.

## 2013-09-08 NOTE — ED Notes (Signed)
Temp unable to obtain axillary, oral or rectally due to patient uncooperative.

## 2013-09-08 NOTE — H&P (Signed)
Date: 09/08/2013               Patient Name:  Angela [name] MRN: [id number]  DOB: [birth year] Age / Sex: 27 y.o., female   PCP: Juluis Mire, MD         Medical Service: Internal Medicine Teaching Service         Attending Physician: Dr. Lynnae January    First Contact: Dr. Mechele Claude Pager: [phone]  Second Contact: Dr. Eulas Post Pager: [phone]       After Hours (After 5p/  First Contact Pager: [phone]  weekends / holidays): Second Contact Pager: [phone]   Chief Complaint: AMS and hypoglycemia  History of Present Illness:  Angela [name] is a 26yo AAF with PMH DM type 1 w/ gastroparesis (Last A1c 10.5% 04/2013), HTN, HLD, hypothyroidism, asthma, depression who presents with her sister and girlfriend after she was found to have a decreased level of consciousness at home this morning. Patient was awake at 7am and was acting normally per girlfriend, then went back to bed after taking Novolog 10U, patient cannot remember whether or not she ate anything, but girlfriend does not think she did. At 0900, girlfriend was awoken by patient snoring/breathing loudly at which point she was unable to awaken patient and called EMS. When EMS arrived, her CBG 15 and patient was given 2 amps of D50. CBG rose to 210, but then dropped to 72 and then to 36 when patient arrived in ED. She was given another amp of D50 and started on D51/2NS. Per sister and girlfriend, patient is very noncompliant with her insulin and all of her other medications including synthroid. Patient does not take her blood sugar at home. She has followed in the [facility], but frequently does not show. Patient is supposed to be on Lantus 35U qHS with Novolog 10-12U TID per sliding scale. During our interview, patient endorsed having nausea, and some central CP. She also c/o ST, HA, and nonproductive cough x 2 days. Denies abd pain, dysuria. Her last meal was at 11pm last night when she ate some chicken wings. Apparently, patient felt like her sugar was "low" so  she drank some chocolate milk late last night after dinner.   In the ED, vital signs: T 97.6, BP141/92, HR 88, RR 18, SpO2 100% r/a. CBC showed WBC 3.7 (baseline 4-5), Hb 10.4 (baseline 8-9). CMP showed Na 134, K 3.4, Cr 1.55 (1.1-1.2 since June 2014, but had been .8-.9 before this), Albumin 3.4, Total bili low at .1. VBG pH 7.35, pCO2 45.9, pO2 65, bicarb 25.6. Lactate 1.8. CK 113. Ammonia 24. UA showed glucose 250, protein 100, and moderate Hb w/ 7-10 RBCs. UDS + cocaine and marijuana. Salicylate, ETOH, and Tylenol levels wnl. Head CT negative for acute process.  Meds: No current facility-administered medications for this encounter.   Current Outpatient Prescriptions  Medication Sig Dispense Refill  . albuterol (PROVENTIL HFA;VENTOLIN HFA) 108 (90 BASE) MCG/ACT inhaler Inhale 1-2 puffs into the lungs every 6 (six) hours as needed for wheezing.  1 Inhaler  10  . esomeprazole (NEXIUM) 20 MG capsule Take 20 mg by mouth daily before breakfast.      . insulin aspart (NOVOLOG) 100 UNIT/ML injection Inject 10-12 Units into the skin 3 (three) times daily before meals. Based on sliding scale - pt has chart at home      . insulin glargine (LANTUS) 100 UNIT/ML injection Inject 35 Units into the skin at bedtime.  15 pen  3  . levothyroxine (SYNTHROID, LEVOTHROID)  100 MCG tablet Take 1 tablet (100 mcg total) by mouth daily before breakfast.  30 tablet  5    Allergies: Allergies as of 09/08/2013 - Review Complete 09/08/2013  Allergen Reaction Noted  . Ciprofloxacin Hives 03/02/2012  . Sulfamethoxazole Itching 09/14/2006  . Trimethoprim Hives 09/14/2006   Past Medical History  Diagnosis Date  . HLD (hyperlipidemia)   . Depression, major     was on multiple medication before followed by psych but was lost to follow up 2-3 years ago when she go arrested, stopped multiple medications that she was on (zoloft, abilify, depakote) , never restarted it  . Anemia     baseline Hb 10-11, ferriting 53  .  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 Hypothyroidism     untreated, non compliance  . Asthma   . Hypothyroidism   . Abnormal Pap smear of cervix     ascus noted 2007  . Gastritis   . Hypertension   . DM type 1 (diabetes mellitus, type 1)     uncontrolled due to medication non compliance, DKA admission at [facility] in 2008, Dx age 11   . GERD (gastroesophageal reflux disease)   . Neuromuscular disorder     DIABETIC NEUROPATHY    Past Surgical History  Procedure Laterality Date  . Other surgical history      right foot  . Foot fusion  2006   Family History  Problem Relation Age of Onset  . Multiple sclerosis Mother   . Hypothyroidism Mother   . Hyperlipidemia Maternal Grandmother   . Hypertension Maternal Grandfather   . Hypertension Maternal Grandmother   . Prostate cancer Maternal Grandfather   . Diabetes type I Maternal Grandfather   . Breast cancer Paternal Grandmother   . Heart disease Maternal Grandmother     unknown type  . Stroke Mother     at age 19 yo   History   Social History  . Marital Status: Divorced    Spouse Name: N/A    Number of Children: 0  . Years of Education: 10th grade   Occupational History  . unemployed     worked at a group   Social History Main Topics  . Smoking status: Former Smoker -- 0.25 packs/day for 2 years    Types: Cigarettes    Quit date: 03/04/2013  . Smokeless tobacco: Never Used     Comment: Will let us know when she is ready.  . Alcohol Use: No  . Drug Use: Yes    Special: Marijuana     Comment: marijuana use, several times weekly  . Sexual Activity: Yes    Birth Control/ Protection: None   Other Topics Concern  . Not on file   Social History Narrative   Occupation: currently unemployed   Single   Homosexual,     Used to be a gang member, got arrested for robbing a gas station (March - June 2012), is cleared now and lives away from her previous friends.    Living with her current partner in [place].  Mother still  active in her life.  mother who is an Glass blower/designer in [employer].    Smoking Status:  Current, Packs/Day:  1   Sexual History:  multiple partners in the past, same sex encounters,current partner is a CNA and she is planning to move in with her   Drug Use:  Marijuana, denies cocaine, heroin, or amphetamines.  Review of Systems: limited 2/2 patient condition General: no fevers, chills, changes in weight, changes in appetite Skin: no rash HEENT: See HPI; no blurry vision, hearing changes Pulm: See HPI. no dyspnea, wheezing CV: See HPI. no palpitations, shortness of breath Abd: See HPI. no abdominal pain, vomiting, diarrhea/constipation GU: no dysuria, hematuria, polyuria Ext: no arthralgias, myalgias Neuro: See HPI. no weakness, numbness, or tingling  Physical Exam: Blood pressure 146/91, pulse 84, temperature 97.6 F (36.4 C), temperature source Oral, resp. rate 18, SpO2 100.00%. General: somnolent, arousable to voice and touch HEENT: pupils equal round and minimally reactive to light, vision grossly intact, oropharynx clear and non-erythematous, MMM Neck: supple Lungs: clear to ascultation bilaterally, normal work of respiration, no wheezes, rales, ronchi Heart: regular rate and rhythm, no murmurs, gallops, or rubs Abdomen: soft, non-tender, non-distended, normal bowel sounds Extremities: 2+ DP/PT pulses bilaterally, no cyanosis, clubbing, or edema Neurologic: somnolent, but arousable to loud voice and touch; oriented X3, cranial nerves II-XII intact, moves all extremities spontaneously, strength grossly intact but assessment limited 2/2 patient condition, sensation intact to light touch  Lab results: Basic Metabolic Panel:  Recent Labs  09/08/13 1105  NA 134*  K 3.4*  CL 99  CO2 23  GLUCOSE 40*  BUN 15  CREATININE 1.55*  CALCIUM 9.9   Liver Function Tests:  Recent Labs  09/08/13 1105  AST 21  ALT 10  ALKPHOS 69  BILITOT 0.1*  PROT 7.0  ALBUMIN  3.4*    Recent Labs  09/08/13 1243  AMMONIA 24   CBC:  Recent Labs  09/08/13 1105  WBC 3.7*  NEUTROABS 1.8  HGB 10.4*  HCT 30.7*  MCV 82.1  PLT 159   Cardiac Enzymes:  Recent Labs  09/08/13 1435  CKTOTAL 113   CBG:  Recent Labs  09/08/13 1051 09/08/13 1113 09/08/13 1141 09/08/13 1235 09/08/13 1354 09/08/13 1423  GLUCAP 135* 66* 66* 131* 147* 182*   Urine Drug Screen: Drugs of Abuse     Component Value Date/Time   LABOPIA NONE DETECTED 09/08/2013 1206   COCAINSCRNUR POSITIVE* 09/08/2013 1206   LABBENZ NONE DETECTED 09/08/2013 1206   AMPHETMU NONE DETECTED 09/08/2013 1206   THCU POSITIVE* 09/08/2013 1206   LABBARB NONE DETECTED 09/08/2013 1206    Alcohol Level:  Recent Labs  09/08/13 1308  ETH <11   Urinalysis:  Recent Labs  09/08/13 1206  COLORURINE YELLOW  LABSPEC 1.009  PHURINE 7.5  GLUCOSEU 250*  HGBUR MODERATE*  BILIRUBINUR NEGATIVE  KETONESUR NEGATIVE  PROTEINUR 100*  UROBILINOGEN 0.2  NITRITE NEGATIVE  LEUKOCYTESUR NEGATIVE   Misc. Labs: Tylenol Q000111Q Salicylate <2 Lactic acid 1.8  Imaging results:  Ct Head Wo Contrast  09/08/2013   CLINICAL DATA:  Altered mental status. Possible overdose.  EXAM: CT HEAD WITHOUT CONTRAST  TECHNIQUE: Contiguous axial images were obtained from the base of the skull through the vertex without intravenous contrast.  COMPARISON:  04/20/2010  FINDINGS: Large mucous retention cyst in the right maxillary sinus. Brain parenchyma, ventricular system, and extra-axial space are normal. No mass effect, midline shift, or acute intracranial hemorrhage.  IMPRESSION: No acute intracranial pathology.   Electronically Signed   By: Maryclare Bean M.D.   On: 09/08/2013 13:24   Other results: EKG: pending  Assessment & Plan by Problem: This is a 27yo AAF with PMH DM type 1 w/ gastroparesis (Last A1c 10.5% 04/2013), HTN, HLD, hypothyroidism, asthma, depression who presents with hypoglycemia and AMS and is admitted for  observation.   #  Hypoglycemia with acute encephalopathy: Patient found to be unresponsive with CBG 15 at home. Patient's blood glucose has responded to total of 3 amps of D50 and she is now on D51/2NS infusion. Despite improvement of her CBG, patient remains somnolent, yet arousable. So far, the remainder of her work up including head CT, lactic acid, ammonia level, CK level, VBG, CBC, CMP, UA do not explain etiology for her AMS. Therefore, at this time we believe that patient's AMS is 2/2 hypoglycemic state and that her AMS will improve as her blood glucose remains stable. Hypoglycemia thought to be 2/2 improper insulin dosing this morning without eating. However, given patient's noncompliance with her synthroid and the persistence of her AMS despite blood glucose control, myxedema coma is a possibility, though is most often seen in elderly patients. Symptoms of myxedema coma do include hypoglycemia as well as hyponatremia and AMS, all of which patient exhibits; however, would also expect to see bradycardia, hypoventillation, hypoxia, which patient does not have at this time. Given patient's significant risk factors and vague complaint of chest pain, will obtain EKG and troponin. -CBG monitoring q1h -D51/2NS at 100cc/hr -stepdown unit, on telemetry -TSH -f/u UCx -UDS+cocaine and marijuana- cocaine last used a few days ago -EKG -troponin x 1 -lantus 5U qHS -D50 prn hypoglycemia; follow hypoglycemia protocol  -zofran prn -BMP and CBC in AM -supp lytes prn- K low today at 3.4, supped -HbA1c, last was 10.5 in May 2014 -urine microalbumin/Cr ratio (last was 21.8 07/11/2012)  #AKI with possible CKD: Cr 1.55 on admission. Patient's Cr has been between 1.1-1.2 since June 2014, but prior to this had been .8-.9. This is likely representative of an AKI on top of some CKD, though unclear baseline. Patient with poorly controlled DM type 1, therefore this is the most likely cause of her kidney disease. Her AKI  may be prerenal in nature given patient does have decreased PO intake at baseline 2/2 gastroparesis, though her BUN/Cr ratio is 10, which is not typical for prerenal etiology.  -monitor kidney function -IVF  # Hypothyroidism: Patient with poor compliance with synthroid at home. Medication noncompliance in the setting of AMS may represent myxedema coma as per above.  -TSH level -continue home dose of synthroid 169mcg daily  # Asthma: Stable, no complaint of SOB.   -Continue home albuterol inhaler prn  # GERD: Stable. Patient is on nexium 20mg  daily at home. Questionable compliance.   -protonix 40mg  daily per formulary  # VTE: heparin  # Diet: carb mod  Code status: full  Dispo: Disposition is deferred at this time, awaiting improvement of current medical problems. Anticipated discharge in approximately 1 day(s).   The patient does have a current PCP (Juluis Mire, MD) and does not need an [facility] hospital follow-up appointment after discharge.  The patient does not have transportation limitations that hinder transportation to clinic appointments.  Signed: Rebecca Eaton, MD 09/08/2013, 3:37 PM

## 2013-09-08 NOTE — ED Notes (Addendum)
Pt noted unresponsive, unresponsive to pain. CBG 66 EDP notifed

## 2013-09-09 DIAGNOSIS — E871 Hypo-osmolality and hyponatremia: Secondary | ICD-10-CM

## 2013-09-09 DIAGNOSIS — E1059 Type 1 diabetes mellitus with other circulatory complications: Secondary | ICD-10-CM

## 2013-09-09 DIAGNOSIS — E875 Hyperkalemia: Secondary | ICD-10-CM

## 2013-09-09 LAB — BASIC METABOLIC PANEL
BUN: 17 mg/dL (ref 6–23)
BUN: 14 mg/dL (ref 6–23)
Calcium: 8.4 mg/dL (ref 8.4–10.5)
Calcium: 8.6 mg/dL (ref 8.4–10.5)
Chloride: 102 mEq/L (ref 96–112)
Creatinine, Ser: 1.59 mg/dL — ABNORMAL HIGH (ref 0.50–1.10)
GFR calc Af Amer: 48 mL/min — ABNORMAL LOW (ref >90)
GFR calc Af Amer: 50 mL/min — ABNORMAL LOW (ref >90)
GFR calc non Af Amer: 42 mL/min — ABNORMAL LOW (ref >90)
Glucose, Bld: 360 mg/dL — ABNORMAL HIGH (ref 70–99)
Glucose, Bld: 185 mg/dL — ABNORMAL HIGH (ref 70–99)
Potassium: 5.5 mEq/L — ABNORMAL HIGH (ref 3.5–5.1)
Potassium: 4.4 mEq/L (ref 3.5–5.1)
Sodium: 128 mEq/L — ABNORMAL LOW (ref 135–145)

## 2013-09-09 LAB — GLUCOSE, CAPILLARY
Glucose-Capillary: 361 mg/dL — ABNORMAL HIGH (ref 70–99)
Glucose-Capillary: 297 mg/dL — ABNORMAL HIGH (ref 70–99)
Glucose-Capillary: 103 mg/dL — ABNORMAL HIGH (ref 70–99)
Glucose-Capillary: 208 mg/dL — ABNORMAL HIGH (ref 70–99)

## 2013-09-09 LAB — CBC
Hemoglobin: 9.3 g/dL — ABNORMAL LOW (ref 12.0–15.0)
MCHC: 33.3 g/dL (ref 30.0–36.0)
RBC: 3.45 MIL/uL — ABNORMAL LOW (ref 3.87–5.11)
WBC: 6.7 10*3/uL (ref 4.0–10.5)

## 2013-09-09 LAB — MICROALBUMIN / CREATININE URINE RATIO
Creatinine, Urine: 49.3 mg/dL
Microalb, Ur: 50.28 mg/dL — ABNORMAL HIGH (ref 0.00–1.89)

## 2013-09-09 LAB — HEMOGLOBIN A1C
Hgb A1c MFr Bld: 10.7 % — ABNORMAL HIGH (ref <5.7)
Mean Plasma Glucose: 260 mg/dL — ABNORMAL HIGH (ref <117)

## 2013-09-09 MED ORDER — INSULIN GLARGINE 100 UNIT/ML ~~LOC~~ SOLN
10.0000 [IU] | Freq: Every day | SUBCUTANEOUS | Status: DC
  Administered 2013-09-09: 10 [IU] via SUBCUTANEOUS
  Filled 2013-09-09 (×2): qty 0.1

## 2013-09-09 MED ORDER — INSULIN ASPART 100 UNIT/ML ~~LOC~~ SOLN: 0.0000 [IU] | Freq: Every day | SUBCUTANEOUS | Status: DC

## 2013-09-09 MED ORDER — METOCLOPRAMIDE HCL 5 MG PO TABS
5.0000 mg | ORAL_TABLET | Freq: Three times a day (TID) | ORAL | Status: DC
  Administered 2013-09-10: 5 mg via ORAL
  Filled 2013-09-09 (×4): qty 1

## 2013-09-09 MED ORDER — INSULIN ASPART 100 UNIT/ML ~~LOC~~ SOLN
0.0000 [IU] | Freq: Three times a day (TID) | SUBCUTANEOUS | Status: DC
  Administered 2013-09-09: 5 [IU] via SUBCUTANEOUS
  Administered 2013-09-10: 3 [IU] via SUBCUTANEOUS

## 2013-09-09 MED ORDER — INSULIN GLARGINE 100 UNIT/ML ~~LOC~~ SOLN
20.0000 [IU] | Freq: Every day | SUBCUTANEOUS | Status: DC
  Filled 2013-09-09: qty 0.2

## 2013-09-09 NOTE — Care Management Note (Signed)
    Page 1 of 1   09/09/2013     1:52:11 PM   CARE MANAGEMENT NOTE 09/09/2013  Patient:  Angela [name], Angela [name]   Account Number:  [id number]  Date Initiated:  09/09/2013  Documentation initiated by:  Marvetta Gibbons  Subjective/Objective Assessment:   Pt admitted with hypoglycemia     Action/Plan:   PTA pt lived at home with roommate- plan to return home   Anticipated DC Date:  09/10/2013   Anticipated DC Plan:  [place]  CM consult      Choice offered to / List presented to:             Status of service:  In process, will continue to follow Medicare Important Message given?   (If response is "NO", the following Medicare IM given date fields will be blank) Date Medicare IM given:   Date Additional Medicare IM given:    Discharge Disposition:    Per UR Regulation:  Reviewed for med. necessity/level of care/duration of stay  If discussed at [place] of Stay Meetings, dates discussed:    Comments:  09/09/13- 1230- Marvetta Gibbons RN, BSN [phone] Referral received regarding medication needs- in to speak with pt at bedside- per conversation pt states that she gets her meds at [facility]- all but her Lantus is on the $4 list- she reports to this CM that she has someone that helps her get her Lantus and she prefers to stay on the Lantus and not change to something else that might be cheaper. She states that she has a meter at home with strips- when asked why she isn't taking her meds- she stated that she just chose not to- when asked if she can afford her meds - she stated that she could. She states that she is followed by the [facility] outpt clinics- had good discussion on choices with pt- pt did get a little teary will ask Chaplain to come see pt. Pt will not need any medication assistance at this time - NCM f/u done with Dr. Mechele Claude-

## 2013-09-09 NOTE — Progress Notes (Signed)
Pt CBG was around 201 at 3pm; taken per DR request. Pt is not eating; states hospital food smells make her sick. Will continue to monitor.

## 2013-09-09 NOTE — Progress Notes (Addendum)
Subjective: Patient feels 100% back at her baseline. She is alert and answering questions appropriately today. She has some chest tightness, but otherwise is asymptomatic. She reports that she doesn't take her CBG at home because she recently lost her glucometer, but she found it on day prior to admission. I discussed the importance of measuring her blood sugar at home and taking her insulin as prescribed. She voices understanding. She is also amenable to talking with Debera Lat, diabetes coordinator, if Butch Penny is available today.  Objective: Vital signs in last 24 hours: Filed Vitals:   09/09/13 0435 09/09/13 0700 09/09/13 0800 09/09/13 1226  BP: 128/87  146/93   Pulse: 88  96   Temp:  98.8 F (37.1 C)  98.9 F (37.2 C)  TempSrc:  Oral  Oral  Resp: 16  16   Height:      Weight:      SpO2: 99%  99%    Weight change:   Intake/Output Summary (Last 24 hours) at 09/09/13 1306 Last data filed at 09/09/13 0828  Gross per 24 hour  Intake 1033.75 ml  Output      0 ml  Net 1033.75 ml   Physical Exam General: alert, cooperative, and in no apparent distress HEENT: pupils equal round and reactive to light, vision grossly intact, oropharynx clear and non-erythematous  Neck: supple Lungs: clear to ascultation bilaterally, normal work of respiration, no wheezes, rales, ronchi Heart: regular rate and rhythm, no murmurs, gallops, or rubs Abdomen: soft, non-tender, non-distended, normal bowel sounds Extremities: 2+ DP/PT pulses bilaterally, no cyanosis, clubbing, or edema Neurologic: alert & oriented X3, speech is normal, follows commands, cranial nerves II-XII grossly intact, strength grossly intact, sensation intact to light touch  Lab Results: Basic Metabolic Panel:  Recent Labs Lab 09/08/13 1105 09/08/13 2107 09/09/13 0410  NA 134*  --  128*  K 3.4*  --  5.5*  CL 99  --  97  CO2 23  --  24  GLUCOSE 40*  --  360*  BUN 15  --  17  CREATININE 1.55* 1.57* 1.64*  CALCIUM 9.9  --   8.4   Liver Function Tests:  Recent Labs Lab 09/08/13 1105  AST 21  ALT 10  ALKPHOS 69  BILITOT 0.1*  PROT 7.0  ALBUMIN 3.4*    Recent Labs Lab 09/08/13 1243  AMMONIA 24   CBC:  Recent Labs Lab 09/08/13 1105 09/08/13 2107 09/09/13 0410  WBC 3.7* 7.5 6.7  NEUTROABS 1.8  --   --   HGB 10.4* 10.6* 9.3*  HCT 30.7* 31.9* 27.9*  MCV 82.1 81.8 80.9  PLT 159 183 157   Cardiac Enzymes:  Recent Labs Lab 09/08/13 1435 09/08/13 1608  CKTOTAL 113  --   TROPONINI  --  <0.30   CBG:  Recent Labs Lab 09/08/13 2004 09/08/13 2110 09/08/13 2205 09/08/13 2313 09/09/13 0433 09/09/13 0756  GLUCAP 246* 306* 324* 368* 361* 297*   Thyroid Function Tests:  Recent Labs Lab 09/08/13 1435  TSH 141.239*   Urine Drug Screen: Drugs of Abuse     Component Value Date/Time   LABOPIA NONE DETECTED 09/08/2013 1206   COCAINSCRNUR POSITIVE* 09/08/2013 1206   LABBENZ NONE DETECTED 09/08/2013 1206   AMPHETMU NONE DETECTED 09/08/2013 1206   THCU POSITIVE* 09/08/2013 1206   LABBARB NONE DETECTED 09/08/2013 1206    Alcohol Level:  Recent Labs Lab 09/08/13 1308  ETH <11   Urinalysis:  Recent Labs Lab 09/08/13 1206  [place]  LABSPEC 1.009  PHURINE 7.5  GLUCOSEU 250*  HGBUR MODERATE*  BILIRUBINUR NEGATIVE  KETONESUR NEGATIVE  PROTEINUR 100*  UROBILINOGEN 0.2  NITRITE NEGATIVE  LEUKOCYTESUR NEGATIVE    Micro Results: No results found for this or any previous visit (from the past 240 hour(s)). Studies/Results: Ct Head Wo Contrast  09/08/2013   CLINICAL DATA:  Altered mental status. Possible overdose.  EXAM: CT HEAD WITHOUT CONTRAST  TECHNIQUE: Contiguous axial images were obtained from the base of the skull through the vertex without intravenous contrast.  COMPARISON:  04/20/2010  FINDINGS: Large mucous retention cyst in the right maxillary sinus. Brain parenchyma, ventricular system, and extra-axial space are normal. No mass effect, midline shift, or acute  intracranial hemorrhage.  IMPRESSION: No acute intracranial pathology.   Electronically Signed   By: Maryclare Bean M.D.   On: 09/08/2013 13:24   Medications: I have reviewed the patient's current medications. Scheduled Meds: . heparin  5,000 Units Subcutaneous Q8H  . influenza vac split quadrivalent PF  0.5 mL Intramuscular Tomorrow-1000  . insulin aspart  0-9 Units Subcutaneous TID WC  . insulin glargine  5 Units Subcutaneous QHS  . levothyroxine  100 mcg Oral QAC breakfast  . pantoprazole  40 mg Oral Daily  . sodium chloride  3 mL Intravenous Q12H   Continuous Infusions: . sodium chloride 125 mL/hr (09/09/13 0828)   PRN Meds:.acetaminophen, albuterol, ondansetron (ZOFRAN) IV, ondansetron  Assessment/Plan: This is a 27yo AAF with PMH DM type 1 w/ gastroparesis (Last A1c 10.5% 04/2013), HTN, HLD, hypothyroidism, asthma, depression who presents with hypoglycemia and AMS and is admitted for observation.   #Type 1 Diabetes Mellitus, uncontrolled #Hypoglycemia with acute encephalopathy: Patient found to be unresponsive with CBG 15 at home. Patient's blood glucose has responded to total of 3 amps of D50. She was kept on D51/2NS infusion and switched to NS once she became hyperglycemic overnight. SSI was started at this time. Given she is currently back at her baseline mental status, her AMS and decreased LOC yesterday is most likely explained by improper insulin dosing without eating or checking her CBG yesterday. -CBG monitoring q4h -NS @ 125 -transfer to med surg floor -f/u UCx  -UDS+cocaine and marijuana- cocaine last used a few days ago  -EKG wnl -troponin negative x 1  -lantus 10U qHS - up from 5U last night -SSI- moderate - K supped yesterday for K of 3.4 and today K is 5.5; will need to monitor this and make sure K is stable and/or decreasing before discharge -HbA1c pending, last was 10.5 in May 2014  -urine microalbumin/Cr ratio 1019.9 (last was 21.8 07/11/2012) -CM consult for  medication management -I spoke with Debera Lat and she will try to see patient prior to discharge   #AKI with possible CKD: Cr 1.55 on admission, today is 1.64. Patient's Cr has been between 1.1-1.2 since June 2014, but prior to this had been .8-.9. This is likely representative of an AKI on top of some CKD, though unclear baseline. Patient with poorly controlled DM type 1 and with urine microalbumin/Cr ratio of 1019.9, which is much increased since may, therefore diabetic nephropathy is the most likely cause of her kidney disease. Her AKI may be prerenal in nature given patient does have decreased PO intake at baseline 2/2 gastroparesis, though her BUN/Cr ratio is 10, which is not typical for prerenal etiology.  -will recheck Cr and if it is stable or decreasing, will discharge patient with close follow up next week in [facility] -IVF-  NS @ 125  # Hypothyroidism: Patient with poor compliance with synthroid at home. Medication noncompliance in the setting of AMS may represent myxedema coma as per above. TSH elevated at 141.23. -continue home dose of synthroid 123mcg daily   # Asthma: Stable, no complaint of SOB.  -Continue home albuterol inhaler prn   # GERD: Stable. Patient is on nexium 20mg  daily at home. Questionable compliance.  -protonix 40mg  daily per formulary   # VTE: heparin   # Diet: carb mod   Code status: full  Dispo: Disposition is deferred at this time, awaiting improvement of current medical problems.  Anticipated discharge in approximately 1 day(s).   The patient does have a current PCP (Juluis Mire, MD) and does not need an [facility] hospital follow-up appointment after discharge.  The patient does not have transportation limitations that hinder transportation to clinic appointments.  .Services Needed at time of discharge: Y = Yes, Blank = No PT:   OT:   RN:   Equipment:   Other:     LOS: 1 day   Rebecca Eaton, MD 09/09/2013, 1:06 PM

## 2013-09-09 NOTE — H&P (Signed)
  Date: 09/09/2013  Patient name: Angela [name]  Medical record number: [id number]  Date of birth: 10/02/1986   I have seen and evaluated Angela [name] and discussed their care with the Residency Team. Angela [name] was admitted with hypoglycemia after injecting 10 units of Novolog and not eating. She had not checked her CBG prior to the injection. On admission, she had AMS and was arousal to voice and gentle sternal rub.   Assessment and Plan: I have seen and evaluated the patient as outlined above. I agree with the formulated Assessment and Plan as detailed in the residents' admission note, with the following changes:   1. Hypoglycemia - She is now on NS (off D5) and on low dose Lantus and SSI Novolog. HEr CBG's were in the 300's when on D5 but now 103 when off. She has poor DM control, doesn't check her CBG, uses insulin not as directed, and already has evidence of end organ damage. She understands the consequences of poor control. If CBG remains steady off D5, if she is able to eat, if she is able to ambulate well, and if her BMP stabilizes she may be D/C'd today.  2. Hypothyroidism - non compliant with her Synthroid. It has been restarted. 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 The cocaine might have blunted the bradycardia and hypothermia response. However, her mental status has normalized with correction of her CBG.   3. Hyponatremia - likely 2/2 her 1/2 NS D5. Has been changed to NS.  4. ARF - recheck BMP  Bartholomew Crews, MD 9/22/20142:01 PM

## 2013-09-09 NOTE — Progress Notes (Signed)
Chaplain responded to consult from nurse. Patient seemed very sleepy and legthargic, seemed to "zone out" a few times during the visit. Patient said that she was doing "fine," but that it had been hard to wake up and not know where she was. She talked about the disorientation she felt after waking up in the hospital and not in her own room like she expected. Chaplain asked how she was feeling now and she said, "much better." Patient did not open up beyond this. Chaplain practiced active listening and empathic presence.

## 2013-09-09 NOTE — Progress Notes (Signed)
Inpatient Diabetes Program Recommendations  AACE/ADA: New Consensus Statement on Inpatient Glycemic Control (2013)  Target Ranges:  Prepandial:   less than 140 mg/dL      Peak postprandial:   less than 180 mg/dL (1-2 hours)      Critically ill patients:  140 - 180 mg/dL   Reason for Assessment: Hyperglycemia, after admission for hypoglycemia Results for Angela [name], Angela [name] (MRN [id number]) as of 09/09/2013 10:48  Ref. Range 09/08/2013 10:36 09/08/2013 10:51 09/08/2013 11:13 09/08/2013 11:41 09/08/2013 12:35 09/08/2013 13:54 09/08/2013 14:23 09/08/2013 15:50 09/08/2013 17:23 09/08/2013 18:24 09/08/2013 20:04 09/08/2013 21:10 09/08/2013 22:05 09/08/2013 23:13 09/09/2013 04:33  Glucose-Capillary Latest Range: 70-99 mg/dL 36 (LL) 135 (H) 66 (L) 66 (L) 131 (H) 147 (H) 182 (H) 197 (H) 206 (H) 201 (H) 246 (H) 306 (H) 324 (H) 368 (H) 361 (H)   Note:  Patient admitted with hypoglycemia and altered mental status.  CBG's steadily rising.  Received Lantus 5 units at HS.  CBG > 300 since bedtime.  Request Lantus dose be increased to closer to home dose and 5 units meal coverage tid with meals in addition to correction scale.  Note Care Management consult.  If patient unable to continue Lantus/Novolog regimen due to inability to afford, please consider switching to pre-mixed 70/30 insulin (Generic 70/30 insulin from [employer] at discharge.)  Based on home dose of insulin would need approximately 30 units 70/30 bid at breakfast and supper.  Requested Social Work consult due to positive drug screen and multiple psyco-social issues.  Thank you.  Amarilis Belflower S. Marcelline Mates, RN, CNS, CDE Inpatient Diabetes Program, team pager [phone]

## 2013-09-09 NOTE — Progress Notes (Signed)
Visit to patient while in hospital, however she was sleeping soundly so did not disturb. Will try to revisit prior to discharge. Agree with referrals to social work. Will call after patient is discharged to assist with transition of care.

## 2013-09-09 NOTE — Progress Notes (Signed)
Utilization review completed.  

## 2013-09-10 ENCOUNTER — Encounter: Payer: Self-pay | Admitting: Dietician

## 2013-09-10 DIAGNOSIS — D649 Anemia, unspecified: Secondary | ICD-10-CM

## 2013-09-10 DIAGNOSIS — E162 Hypoglycemia, unspecified: Secondary | ICD-10-CM

## 2013-09-10 DIAGNOSIS — R4182 Altered mental status, unspecified: Secondary | ICD-10-CM

## 2013-09-10 LAB — GLUCOSE, CAPILLARY
Glucose-Capillary: 197 mg/dL — ABNORMAL HIGH (ref 70–99)
Glucose-Capillary: 158 mg/dL — ABNORMAL HIGH (ref 70–99)

## 2013-09-10 LAB — RENAL FUNCTION PANEL
CO2: 24 mEq/L (ref 19–32)
Calcium: 8.8 mg/dL (ref 8.4–10.5)
Chloride: 104 mEq/L (ref 96–112)
GFR calc Af Amer: 55 mL/min — ABNORMAL LOW (ref >90)
Glucose, Bld: 163 mg/dL — ABNORMAL HIGH (ref 70–99)
Sodium: 136 mEq/L (ref 135–145)

## 2013-09-10 LAB — URINE CULTURE: Culture: NO GROWTH

## 2013-09-10 MED ORDER — INSULIN GLARGINE 100 UNIT/ML ~~LOC~~ SOLN: 15.0000 [IU] | Freq: Every day | SUBCUTANEOUS | Status: DC

## 2013-09-10 MED ORDER — METOCLOPRAMIDE HCL 5 MG PO TABS: 5.0000 mg | ORAL_TABLET | Freq: Three times a day (TID) | ORAL | Status: DC

## 2013-09-10 NOTE — Progress Notes (Signed)
Pt to d/c home this pm. Reviewed discharge instructions, f/up appt, meds, etc. Pt and family understood.

## 2013-09-10 NOTE — Progress Notes (Signed)
Subjective: Patient feels at her baseline mental status. Her only complaint this morning is mild HA. I had a long conversation with her regarding medication compliance and the risks of not checking her blood glucose at home and taking her insulin correctly. She reports she plans to make a lifestyle change and start taking her medications including her synthroid as prescribed. She has a follow up appointment in two days in the [facility] and she plans to take her CBG at home and bring her pump to her appointment. CBGs 170-239 overnight after Lantus was increased to 10U overnight. She believes she will eat better at home, so will plan for discharge on Lantus 15U qHS with close follow up to uptitrate Lantus and add her Novolog back on as needed. Pt understands this plan and is in agreement.   Objective: Vital signs in last 24 hours: Filed Vitals:   09/09/13 2044 09/10/13 0000 09/10/13 0455 09/10/13 0800  BP:   120/87   Pulse:   86   Temp:    98.5 F (36.9 C)  TempSrc:    Oral  Resp: 21 14 9    Height:      Weight:      SpO2:   98%    Weight change:   Intake/Output Summary (Last 24 hours) at 09/10/13 1123 Last data filed at 09/10/13 0600  Gross per 24 hour  Intake   1495 ml  Output      0 ml  Net   1495 ml   Physical Exam General: alert, cooperative, and in no apparent distress HEENT: pupils equal round and reactive to light, vision grossly intact, oropharynx clear and non-erythematous  Neck: supple Lungs: clear to ascultation bilaterally, normal work of respiration, no wheezes, rales, ronchi Heart: regular rate and rhythm, no murmurs, gallops, or rubs Abdomen: soft, non-tender, non-distended, normal bowel sounds Extremities: 2+ DP/PT pulses bilaterally, no cyanosis, clubbing, or edema Neurologic: alert & oriented X3, speech is normal, follows commands, cranial nerves II-XII grossly intact, strength grossly intact, sensation intact to light touch  Lab Results: Basic Metabolic  Panel:  Recent Labs Lab 09/09/13 1443 09/10/13 0440  NA 134* 136  K 4.4 4.2  CL 102 104  CO2 23 24  GLUCOSE 185* 163*  BUN 14 10  CREATININE 1.59* 1.48*  CALCIUM 8.6 8.8  MG  --  1.7  PHOS  --  3.0   Liver Function Tests:  Recent Labs Lab 09/08/13 1105 09/10/13 0440  AST 21  --   ALT 10  --   ALKPHOS 69  --   BILITOT 0.1*  --   PROT 7.0  --   ALBUMIN 3.4* 2.7*    Recent Labs Lab 09/08/13 1243  AMMONIA 24   CBC:  Recent Labs Lab 09/08/13 1105 09/08/13 2107 09/09/13 0410  WBC 3.7* 7.5 6.7  NEUTROABS 1.8  --   --   HGB 10.4* 10.6* 9.3*  HCT 30.7* 31.9* 27.9*  MCV 82.1 81.8 80.9  PLT 159 183 157   Cardiac Enzymes:  Recent Labs Lab 09/08/13 1435 09/08/13 1608  CKTOTAL 113  --   TROPONINI  --  <0.30   CBG:  Recent Labs Lab 09/09/13 1211 09/09/13 1459 09/09/13 1744 09/09/13 1946 09/09/13 2153 09/10/13 0815  GLUCAP 103* 201* 239* 208* 170* 197*   Thyroid Function Tests:  Recent Labs Lab 09/08/13 1435  TSH 141.239*   Urine Drug Screen: Drugs of Abuse     Component Value Date/Time   LABOPIA NONE DETECTED  09/08/2013 [place] 09/08/2013 1206   LABBENZ NONE DETECTED 09/08/2013 1206   AMPHETMU NONE DETECTED 09/08/2013 1206   THCU POSITIVE* 09/08/2013 1206   LABBARB NONE DETECTED 09/08/2013 1206    Alcohol Level:  Recent Labs Lab 09/08/13 1308  ETH <11   Urinalysis:  Recent Labs Lab 09/08/13 1206  [facility] YELLOW  LABSPEC 1.009  PHURINE 7.5  GLUCOSEU 250*  HGBUR MODERATE*  BILIRUBINUR NEGATIVE  KETONESUR NEGATIVE  PROTEINUR 100*  UROBILINOGEN 0.2  NITRITE NEGATIVE  LEUKOCYTESUR NEGATIVE    Micro Results: Recent Results (from the past 240 hour(s))  URINE CULTURE     Status: None   Collection Time    09/08/13 12:06 PM      Result Value Range Status   Specimen Description URINE, CLEAN CATCH   Final   Special Requests NONE   Final   Culture  Setup Time     Final   Value: 09/08/2013 21:18      Performed at [place]     Final   Value: NO GROWTH     Performed at [facility]   Culture     Final   Value: NO GROWTH     Performed at [facility]   Report Status 09/10/2013 FINAL   Final   Studies/Results: Ct Head Wo Contrast  09/08/2013   CLINICAL DATA:  Altered mental status. Possible overdose.  EXAM: CT HEAD WITHOUT CONTRAST  TECHNIQUE: Contiguous axial images were obtained from the base of the skull through the vertex without intravenous contrast.  COMPARISON:  04/20/2010  FINDINGS: Large mucous retention cyst in the right maxillary sinus. Brain parenchyma, ventricular system, and extra-axial space are normal. No mass effect, midline shift, or acute intracranial hemorrhage.  IMPRESSION: No acute intracranial pathology.   Electronically Signed   By: Maryclare Bean M.D.   On: 09/08/2013 13:24   Medications: I have reviewed the patient's current medications. Scheduled Meds: . heparin  5,000 Units Subcutaneous Q8H  . insulin aspart  0-15 Units Subcutaneous TID WC  . insulin aspart  0-5 Units Subcutaneous QHS  . insulin glargine  10 Units Subcutaneous QHS  . levothyroxine  100 mcg Oral QAC breakfast  . metoCLOPramide  5 mg Oral TID AC  . pantoprazole  40 mg Oral Daily  . sodium chloride  3 mL Intravenous Q12H   Continuous Infusions: . sodium chloride 125 mL/hr at 09/10/13 0228   PRN Meds:.acetaminophen, albuterol  Assessment/Plan: This is a 27yo AAF with PMH DM type 1 w/ gastroparesis (Last A1c 10.5% 04/2013), HTN, HLD, hypothyroidism, asthma, depression who presents with hypoglycemia and AMS and is admitted for observation.   #Type 1 Diabetes Mellitus, uncontrolled #Hypoglycemia with acute encephalopathy: Patient found to be unresponsive with CBG 15 at home on morning of admission. Patient's blood glucose has responded to total of 3 amps of D50. She was kept on D51/2NS infusion and switched to NS once she became hyperglycemic overnight. Lantus  5U qHS and SSI was started at this time. Her mental status was back at baseline overnight on HD1, and CBGs were elevated, so Lantus was increased to 10U on HD2. Patient has not been eating well in the hospital and she will likely eat better at home, therefore will plan to discharge her on Lantus 15U. Will not discharge on her home SSI regimen as we do not want her CBG to bottom out at home. Will need to add Novolog back on and  uptitrate Lantus as an outpatient this week according to her CBG readings at home. -CBG monitoring q4h -f/u UCx- NGTD -UDS+cocaine and marijuana- cocaine last used a few days ago  -EKG wnl -troponin negative x 1  -lantus 10U qHS - up from 5U last night-->will discharge on lantus 15U qHS -SSI- moderate - K wnl today -HbA1c 10.7 (10.5 in May 2014) -urine microalbumin/Cr ratio 1019.9 (last was 21.8 07/11/2012) -Patient saw diabetes coordinator yesterday -pt with nausea when she eats, likely 2/2 to her gastroparesis, so will discharge with reglan w/ meals   #AKI with possible CKD: Cr 1.55 on admission-->1.64-->1.59--> 1.48 today. Patient's Cr has been between 1.1-1.2 since June 2014, but prior to this had been .8-.9. This is likely representative of an AKI on top of some CKD, though unclear baseline. Patient with poorly controlled DM type 1 and with urine microalbumin/Cr ratio of 1019.9, which is much increased since may, therefore diabetic nephropathy is the most likely cause of her kidney disease.  -Cr downtrending today -d/c IVF today  # Hypothyroidism: Patient with poor compliance with synthroid at home. TSH elevated at 141.23. -continue home dose of synthroid 127mcg daily  -counseled patient on the importance of taking this medication  # Asthma: Stable, no complaint of SOB.  -Continue home albuterol inhaler prn   # GERD: Stable. Patient is on nexium 20mg  daily at home. Questionable compliance.  -protonix 40mg  daily per formulary   #HLD: Last lipid panel 10/2012:  Total chol 221, TG 127, HDL 78, LDL 118. Therefore, patient does need to be on statin therapy per guidelines. She has been on various statins in the past. She will need to be started on a statin as an outpatient as we do not want to make too many medication changes for this patient upon discharge.   # VTE: heparin   # Diet: carb mod   Code status: full  Dispo: Discharge today.  The patient does have a current PCP (Juluis Mire, MD) and does not need an [facility] hospital follow-up appointment after discharge.  The patient does not have transportation limitations that hinder transportation to clinic appointments.  .Services Needed at time of discharge: Y = Yes, Blank = No PT:   OT:   RN:   Equipment:   Other:     LOS: 2 days   Rebecca Eaton, MD 09/10/2013, 11:23 AM

## 2013-09-10 NOTE — Progress Notes (Signed)
  Date: 09/10/2013  Patient name: Angela [name]  Medical record number: [id number]  Date of birth: 21-Nov-1986   This patient has been seen and the plan of care was discussed with the house staff. Please see their note for complete details. I concur with their findings with the following additions/corrections: [name] is at her baseline, is mentating, taking some PO, and is ready for D/C. Dr Mechele Claude has discussed her D/C meds with her. We have simplified her regimen to the three most important meds - Lantus to prevent DKA, synthroid to improve hypothyroidism and improve her mood (She notices an improvement when on the med), and her Reglan so that she can eat. If she can comply with these, then her other meds can be restarted.    Bartholomew Crews, MD 09/10/2013, 1:23 PM

## 2013-09-10 NOTE — Discharge Summary (Signed)
Name: Angela [name] MRN: [id number] DOB: October 03, 1986 27 y.o. 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     esomeprazole 20 MG capsule  Commonly known as:  NEXIUM  Take 20 mg by mouth daily before breakfast.     insulin glargine 100 UNIT/ML injection  Commonly known as:  LANTUS  Inject 0.15 mLs (15 Units total) into the skin at bedtime.     levothyroxine 100 MCG tablet  Commonly known as:  SYNTHROID, LEVOTHROID  Take 1 tablet (100 mcg total) by mouth daily before breakfast.     metoCLOPramide 5 MG tablet  Commonly known as:  REGLAN  Take 1 tablet (5 mg total) by mouth 3 (three) times daily before meals.        Disposition and follow-up:   Angela [name] was discharged from [facility] in Stable condition.  At the hospital follow up visit please address:  1.  Type 1 DM (uncontrolled) with recent hypoglycemia- Patient agrees to take her CBGs at home and to bring her meter to her next [facility] appt at which point she will likely need her insulin dosing adjusted. Pt with poor insulin compliance and h/o not measuring CBGs. This problem was discussed at length with patient, but should be addressed again. She had come into the hospital on Lantus 35U qHS w/ Novolog  10-12U TID w/ meals per sliding scale. Patient's lantus was slowly increased during her admission and at time of discharge she was still not eating well, so she was instructed to take Lantus 15UqHS with no Novolog at this time until her follow up appointment later this week. Patient will also need retinal scan. 2.  Hyperlipidemia- Patient has been on multiple statins in the past and given she is diabetic w/ last LDL of 118, she needs to be on statin. Patient with h/o medication noncompliance. She was not started on statin at discharge in order to simplify her regimen. However, this should be addressed at hospital follow up visit.  3.  Hypertension- Patient has been on antihypertensives in the past (last regimen was lisinopril 10mg  daily), but she was normotensive during her hospitalization. She was not discharged on any antihypertensives, but this should be monitored. Additionally, she has worsening kidney disease and would definitely benefit from and ACEi/ARB.  2.  Labs / imaging needed at time of follow-up: CBG, CMP (Cr, K, Na), urine microalbumin/Cr ratio repeat (at some point maybe within 3-4 months), retinal scan  3.  Pending labs/ test needing follow-up: UCx (NGTD x 2 days)  Follow-up Appointments: Follow-up Information   Follow up with Luanne Bras, MD On 09/12/2013. (At 3:30pm)    Specialty:  Internal Medicine   Contact information:   [address] [place] [address] [phone]  Discharge Instructions: Discharge Orders   Future Appointments Provider Department Dept Phone   09/12/2013 3:30 PM Corky Sox, MD [place] [phone]   Future Orders Complete By Expires   Call MD for:  extreme fatigue  As directed    Call MD for:  persistant nausea and vomiting  As directed    Diet - low sodium heart healthy  As directed    Increase activity slowly  As directed      Consultations:  diabetes coordinator  Procedures Performed:  Ct Head Wo  Contrast  09/08/2013   CLINICAL DATA:  Altered mental status. Possible overdose.  EXAM: CT HEAD WITHOUT CONTRAST  TECHNIQUE: Contiguous axial images were obtained from the base of the skull through the vertex without intravenous contrast.  COMPARISON:  04/20/2010  FINDINGS: Large mucous retention cyst in the right maxillary sinus. Brain parenchyma, ventricular system, and extra-axial space are normal. No mass effect, midline shift, or acute intracranial hemorrhage.  IMPRESSION: No acute intracranial pathology.   Electronically Signed   By: Maryclare Bean M.D.   On: 09/08/2013 13:24   Admission HPI:  Angela [name] is a 27yo AAF with PMH DM type 1 w/ gastroparesis (Last A1c 10.5% 04/2013), HTN, HLD, hypothyroidism, asthma, depression who presents with her sister and girlfriend after she was found to have a decreased level of consciousness at home this morning. Patient was awake at 7am and was acting normally per girlfriend, then went back to bed after taking Novolog 10U, patient cannot remember whether or not she ate anything, but girlfriend does not think she did. At 0900, girlfriend was awoken by patient snoring/breathing loudly at which point she was unable to awaken patient and called EMS. When EMS arrived, her CBG 15 and patient was given 2 amps of D50. CBG rose to 210, but then dropped to 72 and then to 36 when patient arrived in ED. She was given another amp of D50 and started on D51/2NS. Per sister and girlfriend, patient is very noncompliant with her insulin and all of her other medications including synthroid. Patient does not take her blood sugar at home. She has followed in the [facility], but frequently does not show. Patient is supposed to be on Lantus 35U qHS with Novolog 10-12U TID per sliding scale. During our interview, patient endorsed having nausea, and some central CP. She also c/o ST, HA, and nonproductive cough x 2 days. Denies abd pain, dysuria. Her last meal was at 11pm last night when she ate some  chicken wings. Apparently, patient felt like her sugar was "low" so she drank some chocolate milk late last night after dinner.   In the ED, vital signs: T 97.6, BP141/92, HR 88, RR 18, SpO2 100% r/a. CBC showed WBC 3.7 (baseline 4-5), Hb 10.4 (baseline 8-9). CMP showed Na 134, K 3.4, Cr 1.55 (1.1-1.2 since June 2014, but had been .8-.9 before this), Albumin 3.4, Total bili low at .1. VBG pH 7.35, pCO2 45.9, pO2 65, bicarb 25.6. Lactate 1.8. CK 113. Ammonia 24. UA showed glucose 250, protein 100, and moderate Hb w/ 7-10 RBCs. UDS + cocaine and marijuana. Salicylate, ETOH, and Tylenol levels wnl. Head CT negative for acute process.  Hospital Course by problem list:   #Type 1 Diabetes Mellitus, uncontrolled  #Hypoglycemia with acute encephalopathy: Patient found to be unresponsive with CBG 15 at home on morning of admission. Patient's blood glucose has responded to total of 3 amps of D50, though at time of admission her  mental status was still not at baseline so she was admitted for observation. She was kept on D51/2NS infusion and switched to NS once she became hyperglycemic overnight on HD 1. Lantus 5U qHS and SSI was started at this time. Her mental status returned to baseline overnight on HD1, and CBGs remained elevated, so Lantus was increased to 10U on HD2. Patient with HbA1c 10.7 (prior to this it was 10.5 in May 2014), urine microalbumin/Cr ratio 1019.9 (which is increased from 21.8 on 06/2012). Patient has gastroparesis and ate very little while hospitalized 2/2 nausea with eating- this appears to be chronic issue for patient. Reglan 5mg  TID was started with meals, pt also discharged with this medication as her albumin is low at 2.7 indicating poor nutrition. Patient thought she would eat better at home, so upon discharge we increased her Lantus to 15U qHS. We did not restart her Novolog as we did not want patients blood glucose level to bottom out again at home. Therefore, she was instructed to take  her blood glucose three times a day until her next appointment later this week and to bring her glucometer with her to the appointment so that her lantus and Novolog can be adjusted accordingly. Patient was diagnosed with DM type 1 when she was 2 and has struggled with medication noncompliance for years. She was counseled by the primary IM team as well as Debera Lat while she was an inpatient that she needs to improve her diabetic control. She was made aware of some of the consequences of poor glucose control including neuropathy (which she seems to have early signs of in hands and feet), kidney disease (which she has been developing this year), and visual problems (she needs to have a retinal scan). Of note, patient's UDS was positive for cocaine and marijuana so she may benefit from drug abuse counseling as well. We ruled out ACS upon initial presentation with troponin negative x 1 and normal EKG.  #AKI with possible CKD: Cr 1.55 on admission-->1.64-->1.59--> 1.48 today. Patient's Cr has been between 1.1-1.2 since June 2014, but prior to this had been .8-.9. This is likely representative of an AKI on top of some CKD, though unclear baseline. 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 She would benefit from ACEi/ARB and has been on lisinopril 10mg  daily in the past. Of note, patient's potassium was low on admission and this was supplemented, though it increased to above normal (K 5.5) so this was monitored and at time of discharge her K was wnl.   # Hypothyroidism: Patient with poor compliance with synthroid at home. TSH elevated at 141.23.  We continued home dose of synthroid 151mcg daily. I personally counseled patient on the importance of taking this medication. She frequently feels fatigued with low mood, I told her I suspect that compliance  with her medication will most likely help these symptoms.  # Asthma: Stable, no complaint of SOB. Continued home albuterol inhaler prn, though she did not require.  # GERD: Stable. Patient is on nexium 20mg  daily at home. Questionable compliance. Managed on protonix 40mg  daily per formulary.  #HLD: Last lipid panel 10/2012: Total chol 221, TG 127, HDL 78, LDL 118. Therefore, patient does need to be on statin therapy per guidelines. She has been on various statins in the past. She will need to be started  on a statin as an outpatient as we do not want to make too many medication changes for this patient upon discharge.   Discharge Vitals:   BP 144/98  Pulse 86  Temp(Src) 98.8 F (37.1 C) (Oral)  Resp 9  Ht 5\' 5"  (1.651 m)  Wt 72 kg (158 lb 11.7 oz)  BMI 26.41 kg/m2  SpO2 98%  Discharge Labs:  Results for orders placed during the hospital encounter of 09/08/13 (from the past 24 hour(s))  BASIC METABOLIC PANEL     Status: Abnormal   Collection Time    09/09/13  2:43 PM      Result Value Range   Sodium 134 (*) 135 - 145 mEq/L   Potassium 4.4  3.5 - 5.1 mEq/L   Chloride 102  96 - 112 mEq/L   CO2 23  19 - 32 mEq/L   Glucose, Bld 185 (*) 70 - 99 mg/dL   BUN 14  6 - 23 mg/dL   Creatinine, Ser 1.59 (*) 0.50 - 1.10 mg/dL   Calcium 8.6  8.4 - 10.5 mg/dL   GFR calc non Af Amer 43 (*) >90 mL/min   GFR calc Af Amer 50 (*) >90 mL/min  GLUCOSE, CAPILLARY     Status: Abnormal   Collection Time    09/09/13  2:59 PM      Result Value Range   Glucose-Capillary 201 (*) 70 - 99 mg/dL   Comment 1 Documented in Chart     Comment 2 Notify RN    GLUCOSE, CAPILLARY     Status: Abnormal   Collection Time    09/09/13  5:44 PM      Result Value Range   Glucose-Capillary 239 (*) 70 - 99 mg/dL  GLUCOSE, CAPILLARY     Status: Abnormal   Collection Time    09/09/13  7:46 PM      Result Value Range   Glucose-Capillary 208 (*) 70 - 99 mg/dL  GLUCOSE, CAPILLARY     Status: Abnormal   Collection Time      09/09/13  9:53 PM      Result Value Range   Glucose-Capillary 170 (*) 70 - 99 mg/dL  RENAL FUNCTION PANEL     Status: Abnormal   Collection Time    09/10/13  4:40 AM      Result Value Range   Sodium 136  135 - 145 mEq/L   Potassium 4.2  3.5 - 5.1 mEq/L   Chloride 104  96 - 112 mEq/L   CO2 24  19 - 32 mEq/L   Glucose, Bld 163 (*) 70 - 99 mg/dL   BUN 10  6 - 23 mg/dL   Creatinine, Ser 1.48 (*) 0.50 - 1.10 mg/dL   Calcium 8.8  8.4 - 10.5 mg/dL   Phosphorus 3.0  2.3 - 4.6 mg/dL   Albumin 2.7 (*) 3.5 - 5.2 g/dL   GFR calc non Af Amer 47 (*) >90 mL/min   GFR calc Af Amer 55 (*) >90 mL/min  MAGNESIUM     Status: None   Collection Time    09/10/13  4:40 AM      Result Value Range   Magnesium 1.7  1.5 - 2.5 mg/dL  GLUCOSE, CAPILLARY     Status: Abnormal   Collection Time    09/10/13  8:15 AM      Result Value Range   Glucose-Capillary 197 (*) 70 - 99 mg/dL   Comment 1 Notify RN     Comment  2 Documented in Chart    GLUCOSE, CAPILLARY     Status: Abnormal   Collection Time    09/10/13 11:41 AM      Result Value Range   Glucose-Capillary 158 (*) 70 - 99 mg/dL   Comment 1 Documented in Chart     Comment 2 Notify RN     Signed: Rebecca Eaton, MD 09/10/2013, 1:56 PM   Time Spent on Discharge: 35 minutes Services Ordered on Discharge: none Equipment Ordered on Discharge: none

## 2013-09-10 NOTE — Progress Notes (Signed)
Utilization review completed.  

## 2013-09-11 ENCOUNTER — Telehealth: Payer: Self-pay | Admitting: Dietician

## 2013-09-11 NOTE — Telephone Encounter (Signed)
Discharge date:09-10-13 Call date: 09-11-13 Hospital follow up appointment date: 09-12-13  Calling to assist with transition of care from hospital to home.  Discharge medications reviewed:no, but patient reports they are the same as what she was on prior to her hospitalization Able to fill all prescriptions? She says she was able tot fill what she needed and has strips and meter to check hr blood sugar- highest today was 258 Patient aware of hospital follow up appointments. Yes and she agrees to an eye exam No problems with transportation. She denies saying that she drives  Other problems/concerns: patient denies

## 2013-09-12 ENCOUNTER — Ambulatory Visit (INDEPENDENT_AMBULATORY_CARE_PROVIDER_SITE_OTHER): Payer: Self-pay | Admitting: Internal Medicine

## 2013-09-12 ENCOUNTER — Encounter: Payer: Self-pay | Admitting: Dietician

## 2013-09-12 VITALS — BP 148/100 | HR 95 | Temp 97.9°F | Ht 65.0 in | Wt 162.1 lb

## 2013-09-12 DIAGNOSIS — E785 Hyperlipidemia, unspecified: Secondary | ICD-10-CM

## 2013-09-12 DIAGNOSIS — E1059 Type 1 diabetes mellitus with other circulatory complications: Secondary | ICD-10-CM

## 2013-09-12 DIAGNOSIS — I10 Essential (primary) hypertension: Secondary | ICD-10-CM

## 2013-09-12 LAB — GLUCOSE, CAPILLARY: Glucose-Capillary: 112 mg/dL — ABNORMAL HIGH (ref 70–99)

## 2013-09-12 LAB — HM DIABETES EYE EXAM

## 2013-09-12 MED ORDER — INSULIN ASPART 100 UNIT/ML ~~LOC~~ SOLN: SUBCUTANEOUS | Status: DC

## 2013-09-12 MED ORDER — LISINOPRIL 10 MG PO TABS: 10.0000 mg | ORAL_TABLET | Freq: Every day | ORAL | Status: DC

## 2013-09-12 MED ORDER — PRAVASTATIN SODIUM 20 MG PO TABS: 20.0000 mg | ORAL_TABLET | Freq: Every evening | ORAL | Status: DC

## 2013-09-12 NOTE — Patient Instructions (Signed)
1. Please schedule a follow up appointment for 2 weeks. PLEASE BRING YOUR METER WITH YOU TO THE APPOINTMENT AND CHECK YOUR SUGARS REGULARLY.  2. Please take all medications as prescribed.  Start taking Lisinopril 10 mg daily.  Start taking Pravastatin 20 mg at night time. Start using Novolog 3 times daily; inject 5 units if blood sugar is >250, inject 10 units if blood sugar is >400. If your sugars are significantly higher than this, please call the clinic or go to the ED.  3. If you have worsening of your symptoms or new symptoms arise, please call the clinic [phone]), or go to the ER immediately if symptoms are severe.

## 2013-09-13 ENCOUNTER — Encounter (HOSPITAL_COMMUNITY): Payer: Self-pay | Admitting: *Deleted

## 2013-09-13 ENCOUNTER — Emergency Department (HOSPITAL_COMMUNITY): Payer: Self-pay

## 2013-09-13 ENCOUNTER — Emergency Department (HOSPITAL_COMMUNITY)
Admission: EM | Admit: 2013-09-13 | Discharge: 2013-09-13 | Disposition: A | Discharge status: Home / Self Care | Payer: Self-pay | Attending: Emergency Medicine | Admitting: Emergency Medicine

## 2013-09-13 DIAGNOSIS — W2209XA Striking against other stationary object, initial encounter: Secondary | ICD-10-CM | POA: Insufficient documentation

## 2013-09-13 DIAGNOSIS — K219 Gastro-esophageal reflux disease without esophagitis: Secondary | ICD-10-CM | POA: Insufficient documentation

## 2013-09-13 DIAGNOSIS — J45909 Unspecified asthma, uncomplicated: Secondary | ICD-10-CM | POA: Insufficient documentation

## 2013-09-13 DIAGNOSIS — Y939 Activity, unspecified: Secondary | ICD-10-CM | POA: Insufficient documentation

## 2013-09-13 DIAGNOSIS — Z862 Personal history of diseases of the blood and blood-forming organs and certain disorders involving the immune mechanism: Secondary | ICD-10-CM | POA: Insufficient documentation

## 2013-09-13 DIAGNOSIS — S63501A Unspecified sprain of right wrist, initial encounter: Secondary | ICD-10-CM

## 2013-09-13 DIAGNOSIS — Z79899 Other long term (current) drug therapy: Secondary | ICD-10-CM | POA: Insufficient documentation

## 2013-09-13 DIAGNOSIS — E109 Type 1 diabetes mellitus without complications: Secondary | ICD-10-CM | POA: Insufficient documentation

## 2013-09-13 DIAGNOSIS — G4700 Insomnia, unspecified: Secondary | ICD-10-CM | POA: Insufficient documentation

## 2013-09-13 DIAGNOSIS — I10 Essential (primary) hypertension: Secondary | ICD-10-CM | POA: Insufficient documentation

## 2013-09-13 DIAGNOSIS — Z87891 Personal history of nicotine dependence: Secondary | ICD-10-CM | POA: Insufficient documentation

## 2013-09-13 DIAGNOSIS — S63509A Unspecified sprain of unspecified wrist, initial encounter: Secondary | ICD-10-CM | POA: Insufficient documentation

## 2013-09-13 DIAGNOSIS — Z8719 Personal history of other diseases of the digestive system: Secondary | ICD-10-CM | POA: Insufficient documentation

## 2013-09-13 DIAGNOSIS — E039 Hypothyroidism, unspecified: Secondary | ICD-10-CM | POA: Insufficient documentation

## 2013-09-13 DIAGNOSIS — Z8669 Personal history of other diseases of the nervous system and sense organs: Secondary | ICD-10-CM | POA: Insufficient documentation

## 2013-09-13 DIAGNOSIS — F329 Major depressive disorder, single episode, unspecified: Secondary | ICD-10-CM | POA: Insufficient documentation

## 2013-09-13 DIAGNOSIS — Y929 Unspecified place or not applicable: Secondary | ICD-10-CM | POA: Insufficient documentation

## 2013-09-13 DIAGNOSIS — E785 Hyperlipidemia, unspecified: Secondary | ICD-10-CM | POA: Insufficient documentation

## 2013-09-13 DIAGNOSIS — Z794 Long term (current) use of insulin: Secondary | ICD-10-CM | POA: Insufficient documentation

## 2013-09-13 LAB — COMPREHENSIVE METABOLIC PANEL
ALT: 12 U/L (ref 0–35)
Alkaline Phosphatase: 73 U/L (ref 39–117)
CO2: 27 mEq/L (ref 19–32)
Creat: 1.55 mg/dL — ABNORMAL HIGH (ref 0.50–1.10)
Glucose, Bld: 166 mg/dL — ABNORMAL HIGH (ref 70–99)
Total Bilirubin: 0.2 mg/dL — ABNORMAL LOW (ref 0.3–1.2)

## 2013-09-13 MED ORDER — NAPROXEN 500 MG PO TABS: 500.0000 mg | ORAL_TABLET | Freq: Two times a day (BID) | ORAL | Status: DC

## 2013-09-13 MED ORDER — IBUPROFEN 800 MG PO TABS
800.0000 mg | ORAL_TABLET | Freq: Once | ORAL | Status: AC
  Administered 2013-09-13: 800 mg via ORAL
  Filled 2013-09-13: qty 1

## 2013-09-13 MED ORDER — HYDROCODONE-ACETAMINOPHEN 5-325 MG PO TABS: 1.0000 | ORAL_TABLET | Freq: Once | ORAL | Status: DC

## 2013-09-13 NOTE — ED Notes (Addendum)
Pt states yesterday she punched a refrigerator with her right hand. Pt has swelling to right hand around knuckles. Pt also complaining of pain to right wrist.

## 2013-09-13 NOTE — ED Provider Notes (Signed)
CSN: [id number]     Arrival date & time 09/13/13  0242 History   First MD Initiated Contact with Patient 09/13/13 0301     Chief Complaint  Patient presents with  . Hand Injury   HPI  History provided by the patient. Patient is a 27 year old female presenting with right hand and wrist pains. Patient states that she is frustrated and punched a refrigerator yesterday with her right hand in the afternoon. Since that time she's had some pain around her hand and wrist. She did try to rest her hand and use some ice without any significant improvements. She did not take any medications for her symptoms. She denies any weakness or numbness to the hand or fingers. There has been no significant swelling. Denies any other injuries or complaints. No other aggravating or alleviating factors.    Past Medical History  Diagnosis Date  . HLD (hyperlipidemia)   . Depression, major     was on multiple medication before followed by psych but was lost to follow up 2-3 years ago when she go arrested, stopped multiple medications that she was on (zoloft, abilify, depakote) , never restarted it  . Anemia     baseline Hb 10-11, ferriting 53  . 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 Hypothyroidism     untreated, non compliance  . Asthma   . Hypothyroidism   . Abnormal Pap smear of cervix     ascus noted 2007  . Gastritis   . Hypertension   . DM type 1 (diabetes mellitus, type 1)     uncontrolled due to medication non compliance, DKA admission at [facility] in 2008, Dx age 19   . GERD (gastroesophageal reflux disease)   . Neuromuscular disorder     DIABETIC NEUROPATHY    Past Surgical History  Procedure Laterality Date  . Other surgical history      right foot  . Foot fusion  2006   Family History  Problem Relation Age of Onset  . Multiple sclerosis Mother   . Hypothyroidism Mother   . Hyperlipidemia Maternal Grandmother   . Hypertension Maternal Grandfather   . Hypertension Maternal Grandmother    . Prostate cancer Maternal Grandfather   . Diabetes type I Maternal Grandfather   . Breast cancer Paternal Grandmother   . Heart disease Maternal Grandmother     unknown type  . Stroke Mother     at age 88 yo   History  Substance Use Topics  . Smoking status: Former Smoker -- 0.25 packs/day for 2 years    Types: Cigarettes    Quit date: 03/04/2013  . Smokeless tobacco: Never Used     Comment: Will let us know when she is ready.  . Alcohol Use: No   OB History   Grav Para Term Preterm Abortions TAB SAB Ect Mult Living   0 0 0 0 0 0 0 0 0 0      Review of Systems  Neurological: Negative for weakness and numbness.  All other systems reviewed and are negative.    Allergies  Ciprofloxacin; Sulfamethoxazole; and Trimethoprim  Home Medications   Current Outpatient Rx  Name  Route  Sig  Dispense  Refill  . albuterol (PROVENTIL HFA;VENTOLIN HFA) 108 (90 BASE) MCG/ACT inhaler   Inhalation   Inhale 1-2 puffs into the lungs every 6 (six) hours as needed for wheezing.   1 Inhaler   10   . esomeprazole (NEXIUM) 20 MG capsule   Oral  Take 20 mg by mouth daily before breakfast.         . insulin aspart (NOVOLOG) 100 UNIT/ML injection      Inject 5 units Novolog 3 times daily if blood sugar is >250, or 10 units  if blood sugar is >400.   10 mL   5   . insulin glargine (LANTUS) 100 UNIT/ML injection   Subcutaneous   Inject 0.15 mLs (15 Units total) into the skin at bedtime.   10 mL   12   . levothyroxine (SYNTHROID, LEVOTHROID) 100 MCG tablet   Oral   Take 1 tablet (100 mcg total) by mouth daily before breakfast.   30 tablet   5   . lisinopril (PRINIVIL,ZESTRIL) 10 MG tablet   Oral   Take 1 tablet (10 mg total) by mouth daily.   30 tablet   11   . metoCLOPramide (REGLAN) 5 MG tablet   Oral   Take 1 tablet (5 mg total) by mouth 3 (three) times daily before meals.   90 tablet   2   . pravastatin (PRAVACHOL) 20 MG tablet   Oral   Take 1 tablet (20 mg total)  by mouth every evening.   30 tablet   11    BP 160/104  Pulse 98  Temp(Src) 97.9 F (36.6 C) (Oral)  Resp 16  SpO2 100%  LMP 09/08/2013 Physical Exam  Nursing note and vitals reviewed. Constitutional: She is oriented to person, place, and time. She appears well-developed and well-nourished. No distress.  HENT:  Head: Normocephalic.  Eyes: Conjunctivae are normal.  Cardiovascular: Normal rate and regular rhythm.   Pulmonary/Chest: Effort normal and breath sounds normal. No respiratory distress. She has no wheezes.  Musculoskeletal: She exhibits tenderness. She exhibits no edema.  Slight reduction in range of motion of the wrist and the digits of the right hand secondary to pain. There is no significant swelling or deformities. Pain is primarily focused over the proximal second metatarsal and along the radius. Patient has normal sensations to light touch distally. Normal capillary refill less than 2 seconds.  Neurological: She is alert and oriented to person, place, and time.  Skin: Skin is warm and dry. No rash noted.  Psychiatric: She has a normal mood and affect. Her behavior is normal.    ED Course  Procedures     Imaging Review Dg Wrist Complete Right  09/13/2013   CLINICAL DATA:  Blunt trauma. Pain and swelling about the 5th metacarpal and little finger.  EXAM: RIGHT WRIST - COMPLETE 3+ VIEW  COMPARISON:  None.  FINDINGS: No acute bony or joint abnormality is identified. Mild ulnar minus variance is noted.  IMPRESSION: No acute finding.   Electronically Signed   By: Inge Rise M.D.   On: 09/13/2013 03:43   Dg Hand Complete Right  09/13/2013   CLINICAL DATA:  Blunt trauma. Pain about the 5th metacarpal and little finger.  EXAM: RIGHT HAND - COMPLETE 3+ VIEW  COMPARISON:  None.  FINDINGS: No acute bony or joint abnormality is identified. Mild ulnar minus variance is noted.  IMPRESSION: No acute finding.   Electronically Signed   By: Inge Rise M.D.   On: 09/13/2013  03:44    MDM   1. Wrist sprain, right, initial encounter       Patient seen and evaluated. Patient appears in moderate discomfort no acute distress. There is no sniffing swelling or deformities of the hand and wrist area. X-rays were reviewed without signs  of fracture or dislocation. I reviewed the x-rays with the patient and discussed the findings and treatment plan. Will provide an Ace wrap to help give support and compression of the wrist. Will continue to recommend rest, ice, compression elevation. She agrees with plan.    Martie Lee, PA-C 09/13/13 0401

## 2013-09-13 NOTE — Progress Notes (Signed)
I saw and evaluated the patient.  I personally confirmed the key portions of the history and exam documented by Dr. Jones and I reviewed pertinent patient test results.  The assessment, diagnosis, and plan were formulated together and I agree with the documentation in the resident's note.   

## 2013-09-13 NOTE — Assessment & Plan Note (Signed)
-  Patient previously on Pravastatin 20 mg qhs, prior to hospital admission. This was d/c'ed at discharge in order to simplify her medication regimen. However, given her h/o elevated LDL in the setting of DM, she was restarted on Pravastatin 20 mg qhs.  -Patient will follow up in 2 weeks.

## 2013-09-13 NOTE — Assessment & Plan Note (Signed)
Patient recently discharged from the hospital after admission for hypoglycemic encephalopathy. Patient injected Novolog w/out eating anything, resulting in decreased LOC and CBG on arrival to the ED of 15. Patient with extensive history of poor medication compliance and failure to check her blood sugars at home. Prior to admission, patient was taking Lantus 35 units + Novolog 10-12 units SS w/ meals. On discharge, the patient was sent home on Lantus 15 units and no Novolog coverage d/t poor oral intake. Her sugars since discharge have been 350-450. -Restarted Novolog coverage for now; started on Novolog w/ meals, 5 units if blood sugar >250, 10 units of blood sugar >400. Will continue w/ Lantus at 15 units for now.  -Advised patient to return in 2 weeks for further medication adjustment. Had very long discussion about checking her blood sugars and danger of injecting Novolog w/out eating proper meals. She claimed she would start checking sugars regularly as her new regimen requires that she knows her blood sugar prior to injecting.  -Patient also had retinal scan today.

## 2013-09-13 NOTE — Progress Notes (Signed)
Patient ID: Angela [name], female   DOB: 08-Sep-1986, 27 y.o.   MRN: [id number]   Subjective:   Patient ID: Angela [name] female   DOB: 1986-09-20 27 y.o.   MRN: [id number]  HPI: [name] is a 27 y.o. F w/ PMHx of DM type I (w/ gastroparesis), HTN, HLD, depression, Asthma, hypothyroidism, GERD, presents to the clinic for hospital follow up. The patient was recently admitted to [facility] on 09/08/13 for hypoglycemic encephalopathy. The patient presented to the ED w/ CBG of 15 after taking Novolog injection without eating at home. The patient has an extensive history of uncontrolled DM type I w/ gastroparesis and also has a history of frequent medications non-compliance and rarely checks her blood sugar.  Prior to admission, the patient was using Lantus 35 units in PM w/ Novolog SS 10-12 units w/ meals. On discharge from the hospital, the patient was sent home on Lantus 15 units w/out Novolog use w/ meals as she continued to have poor appetite. The patient was discharged on 09/10/13 and returns today after checking her blood sugar 2x yesterday and once this AM, and her readings have been 350's-450's each time. She denies any issues since her discharge but did feel her vision get slightly blurry this AM, when she checked he sugar revealing 450. Even though she was advised not to, the patient injected herself with 10 units of Novolog at that time. On arrival to the clinic today, her blood sugar was 112.  Otherwise, the patient says she has been feeling okay since her discharge. Denies any headaches, nausea, vomiting, fever, chills, abdominal pain, diarrhea, palpitations, dizziness, lightheadedness, or LOC.    Past Medical History  Diagnosis Date  . HLD (hyperlipidemia)   . Depression, major     was on multiple medication before followed by psych but was lost to follow up 2-3 years ago when she go arrested, stopped multiple medications that she was on (zoloft, abilify, depakote) , never restarted it  .  Anemia     baseline Hb 10-11, ferriting 53  . 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 Hypothyroidism     untreated, non compliance  . Asthma   . Hypothyroidism   . Abnormal Pap smear of cervix     ascus noted 2007  . Gastritis   . Hypertension   . DM type 1 (diabetes mellitus, type 1)     uncontrolled due to medication non compliance, DKA admission at [facility] in 2008, Dx age 27   . GERD (gastroesophageal reflux disease)   . Neuromuscular disorder     DIABETIC NEUROPATHY    Current Outpatient Prescriptions  Medication Sig Dispense Refill  . albuterol (PROVENTIL HFA;VENTOLIN HFA) 108 (90 BASE) MCG/ACT inhaler Inhale 1-2 puffs into the lungs every 6 (six) hours as needed for wheezing.  1 Inhaler  10  . esomeprazole (NEXIUM) 20 MG capsule Take 20 mg by mouth daily before breakfast.      . insulin aspart (NOVOLOG) 100 UNIT/ML injection Inject 5 units Novolog 3 times daily if blood sugar is >250, or 10 units  if blood sugar is >400.  10 mL  5  . insulin glargine (LANTUS) 100 UNIT/ML injection Inject 0.15 mLs (15 Units total) into the skin at bedtime.  10 mL  12  . levothyroxine (SYNTHROID, LEVOTHROID) 100 MCG tablet Take 1 tablet (100 mcg total) by mouth daily before breakfast.  30 tablet  5  . lisinopril (PRINIVIL,ZESTRIL) 10 MG tablet Take 1 tablet (10 mg  total) by mouth daily.  30 tablet  11  . metoCLOPramide (REGLAN) 5 MG tablet Take 1 tablet (5 mg total) by mouth 3 (three) times daily before meals.  90 tablet  2  . naproxen (NAPROSYN) 500 MG tablet Take 1 tablet (500 mg total) by mouth 2 (two) times daily.  30 tablet  0  . pravastatin (PRAVACHOL) 20 MG tablet Take 1 tablet (20 mg total) by mouth every evening.  30 tablet  11   No current facility-administered medications for this visit.   Family History  Problem Relation Age of Onset  . Multiple sclerosis Mother   . Hypothyroidism Mother   . Hyperlipidemia Maternal Grandmother   . Hypertension Maternal Grandfather   .  Hypertension Maternal Grandmother   . Prostate cancer Maternal Grandfather   . Diabetes type I Maternal Grandfather   . Breast cancer Paternal Grandmother   . Heart disease Maternal Grandmother     unknown type  . Stroke Mother     at age 79 yo   History   Social History  . Marital Status: Divorced    Spouse Name: N/A    Number of Children: 0  . Years of Education: 10th grade   Occupational History  . unemployed     worked at a group   Social History Main Topics  . Smoking status: Former Smoker -- 0.25 packs/day for 2 years    Types: Cigarettes    Quit date: 03/04/2013  . Smokeless tobacco: Never Used     Comment: Will let us know when she is ready.  . Alcohol Use: No  . Drug Use: Yes    Special: Marijuana     Comment: marijuana use, several times weekly  . Sexual Activity: Yes    Birth Control/ Protection: None   Other Topics Concern  . Not on file   Social History Narrative   Occupation: currently unemployed   Single   Homosexual,     Used to be a gang member, got arrested for robbing a gas station (March - June 2012), is cleared now and lives away from her previous friends.    Living with her current partner in [place].  Mother still active in her life.  mother who is an Glass blower/designer in [employer].    Smoking Status:  Current, Packs/Day:  1   Sexual History:  multiple partners in the past, same sex encounters,current partner is a CNA and she is planning to move in with her   Drug Use:  Marijuana, denies cocaine, heroin, or amphetamines.           Review of Systems: General: Positive for poor appetite. Denies fever, chills, diaphoresis, and fatigue.  Respiratory: Denies SOB, DOE, cough, chest tightness, and wheezing.   Cardiovascular: Denies chest pain, palpitations and leg swelling.  Gastrointestinal: Denies nausea, vomiting, abdominal pain, diarrhea, constipation, blood in stool and abdominal distention.  Genitourinary: Denies dysuria, urgency, frequency,  hematuria, flank pain and difficulty urinating.  Endocrine: Denies hot or cold intolerance, sweats, polyuria, polydipsia. Musculoskeletal: Denies myalgias, back pain, joint swelling, arthralgias and gait problem.  Skin: Denies pallor, rash and wounds.  Neurological: Denies dizziness, seizures, syncope, weakness, lightheadedness, numbness and headaches.  Psychiatric/Behavioral: Denies mood changes, confusion, nervousness, sleep disturbance and agitation.  Objective:  Physical Exam: Filed Vitals:   09/12/13 1542  BP: 148/100  Pulse: 95  Temp: 97.9 F (36.6 C)  TempSrc: Oral  Height: 5\' 5"  (1.651 m)  Weight: 162 lb 1.6 oz (73.528  kg)  SpO2: 98%   General: Vital signs reviewed.  Patient is a well-developed and well-nourished, in no acute distress and cooperative with exam. Alert and oriented x3.  Head: Normocephalic and atraumatic. Eyes: PERRL, EOMI, conjunctivae normal, No scleral icterus.  Neck: Supple, trachea midline, normal ROM, No JVD, masses, thyromegaly, or carotid bruit present.  Cardiovascular: RRR, S1 normal, S2 normal, no murmurs, gallops, or rubs. Pulmonary/Chest: Normal respiratory effort, CTAB, no wheezes, rales, or rhonchi. Abdominal: Soft, non-tender, non-distended, bowel sounds are normal, no masses, organomegaly, or guarding present.  Musculoskeletal: No joint deformities, erythema, or stiffness, ROM full and no nontender. Extremities: No swelling or edema,  pulses symmetric and intact bilaterally. No cyanosis or clubbing. Neurological: A&O x3, Strength is normal and symmetric bilaterally, cranial nerve II-XII are grossly intact, no focal motor deficit, sensory intact to light touch bilaterally.  Skin: Warm, dry and intact. No rashes or erythema. Psychiatric: Normal mood and affect. speech and behavior is normal. Cognition and memory are normal.   Assessment & Plan:   Please see problem-based assessment and plan.

## 2013-09-13 NOTE — Assessment & Plan Note (Signed)
Patient was taking Lisinopril 10 mg qd prior to her admission, however, she was normotensive during her hospital stay, so this was d/c'ed on discharge. Today, she presents with BP of 148/100. -Restarted Lisinopril 10 mg qd.

## 2013-09-13 NOTE — ED Provider Notes (Signed)
Medical screening examination/treatment/procedure(s) were performed by non-physician practitioner and as supervising physician I was immediately available for consultation/collaboration.  Kalman Drape, MD 09/13/13 [phone]

## 2013-09-16 ENCOUNTER — Ambulatory Visit: Payer: Self-pay | Admitting: Internal Medicine

## 2013-09-18 ENCOUNTER — Encounter: Payer: Self-pay | Admitting: Dietician

## 2013-09-19 ENCOUNTER — Ambulatory Visit (INDEPENDENT_AMBULATORY_CARE_PROVIDER_SITE_OTHER): Payer: Self-pay | Admitting: Internal Medicine

## 2013-09-19 ENCOUNTER — Encounter: Payer: Self-pay | Admitting: Internal Medicine

## 2013-09-19 ENCOUNTER — Encounter (HOSPITAL_COMMUNITY): Payer: Self-pay | Admitting: General Practice

## 2013-09-19 ENCOUNTER — Other Ambulatory Visit: Payer: Self-pay | Admitting: Internal Medicine

## 2013-09-19 ENCOUNTER — Telehealth: Payer: Self-pay | Admitting: *Deleted

## 2013-09-19 ENCOUNTER — Observation Stay (HOSPITAL_COMMUNITY)
Admission: AD | Admit: 2013-09-19 | Discharge: 2013-09-20 | Disposition: A | Discharge status: Home / Self Care | Payer: Self-pay | Source: Ambulatory Visit | Attending: Internal Medicine | Admitting: Internal Medicine

## 2013-09-19 VITALS — BP 143/98 | HR 102 | Temp 97.2°F | Wt 155.9 lb

## 2013-09-19 DIAGNOSIS — E1142 Type 2 diabetes mellitus with diabetic polyneuropathy: Secondary | ICD-10-CM | POA: Insufficient documentation

## 2013-09-19 DIAGNOSIS — R42 Dizziness and giddiness: Secondary | ICD-10-CM | POA: Insufficient documentation

## 2013-09-19 DIAGNOSIS — E1049 Type 1 diabetes mellitus with other diabetic neurological complication: Secondary | ICD-10-CM | POA: Insufficient documentation

## 2013-09-19 DIAGNOSIS — E1059 Type 1 diabetes mellitus with other circulatory complications: Secondary | ICD-10-CM

## 2013-09-19 DIAGNOSIS — J020 Streptococcal pharyngitis: Secondary | ICD-10-CM | POA: Diagnosis present

## 2013-09-19 DIAGNOSIS — E039 Hypothyroidism, unspecified: Secondary | ICD-10-CM | POA: Insufficient documentation

## 2013-09-19 DIAGNOSIS — R0789 Other chest pain: Secondary | ICD-10-CM | POA: Insufficient documentation

## 2013-09-19 DIAGNOSIS — N1831 Chronic kidney disease, stage 3a: Secondary | ICD-10-CM

## 2013-09-19 DIAGNOSIS — R350 Frequency of micturition: Secondary | ICD-10-CM | POA: Insufficient documentation

## 2013-09-19 DIAGNOSIS — R1013 Epigastric pain: Secondary | ICD-10-CM | POA: Insufficient documentation

## 2013-09-19 DIAGNOSIS — B2790 Infectious mononucleosis, unspecified without complication: Secondary | ICD-10-CM | POA: Diagnosis present

## 2013-09-19 DIAGNOSIS — Z794 Long term (current) use of insulin: Secondary | ICD-10-CM | POA: Insufficient documentation

## 2013-09-19 DIAGNOSIS — I10 Essential (primary) hypertension: Secondary | ICD-10-CM

## 2013-09-19 DIAGNOSIS — E785 Hyperlipidemia, unspecified: Secondary | ICD-10-CM

## 2013-09-19 DIAGNOSIS — Z87891 Personal history of nicotine dependence: Secondary | ICD-10-CM | POA: Insufficient documentation

## 2013-09-19 DIAGNOSIS — J029 Acute pharyngitis, unspecified: Secondary | ICD-10-CM | POA: Insufficient documentation

## 2013-09-19 DIAGNOSIS — E10649 Type 1 diabetes mellitus with hypoglycemia without coma: Secondary | ICD-10-CM | POA: Diagnosis present

## 2013-09-19 DIAGNOSIS — R6883 Chills (without fever): Secondary | ICD-10-CM | POA: Insufficient documentation

## 2013-09-19 DIAGNOSIS — E1110 Type 2 diabetes mellitus with ketoacidosis without coma: Secondary | ICD-10-CM

## 2013-09-19 DIAGNOSIS — E11319 Type 2 diabetes mellitus with unspecified diabetic retinopathy without macular edema: Secondary | ICD-10-CM

## 2013-09-19 DIAGNOSIS — D649 Anemia, unspecified: Secondary | ICD-10-CM | POA: Diagnosis present

## 2013-09-19 DIAGNOSIS — R112 Nausea with vomiting, unspecified: Secondary | ICD-10-CM

## 2013-09-19 DIAGNOSIS — N183 Chronic kidney disease, stage 3 unspecified: Secondary | ICD-10-CM | POA: Insufficient documentation

## 2013-09-19 DIAGNOSIS — E1029 Type 1 diabetes mellitus with other diabetic kidney complication: Secondary | ICD-10-CM | POA: Diagnosis present

## 2013-09-19 DIAGNOSIS — R209 Unspecified disturbances of skin sensation: Secondary | ICD-10-CM | POA: Insufficient documentation

## 2013-09-19 DIAGNOSIS — R197 Diarrhea, unspecified: Secondary | ICD-10-CM | POA: Insufficient documentation

## 2013-09-19 DIAGNOSIS — E1159 Type 2 diabetes mellitus with other circulatory complications: Secondary | ICD-10-CM | POA: Diagnosis present

## 2013-09-19 DIAGNOSIS — K219 Gastro-esophageal reflux disease without esophagitis: Secondary | ICD-10-CM | POA: Insufficient documentation

## 2013-09-19 DIAGNOSIS — E1010 Type 1 diabetes mellitus with ketoacidosis without coma: Principal | ICD-10-CM | POA: Insufficient documentation

## 2013-09-19 DIAGNOSIS — Z8659 Personal history of other mental and behavioral disorders: Secondary | ICD-10-CM | POA: Insufficient documentation

## 2013-09-19 DIAGNOSIS — H9209 Otalgia, unspecified ear: Secondary | ICD-10-CM | POA: Insufficient documentation

## 2013-09-19 LAB — BASIC METABOLIC PANEL
BUN: 18 mg/dL (ref 6–23)
BUN: 18 mg/dL (ref 6–23)
Calcium: 8.9 mg/dL (ref 8.4–10.5)
Calcium: 8.8 mg/dL (ref 8.4–10.5)
Chloride: 99 mEq/L (ref 96–112)
GFR calc Af Amer: 58 mL/min — ABNORMAL LOW (ref >90)
GFR calc Af Amer: 55 mL/min — ABNORMAL LOW (ref >90)
GFR calc non Af Amer: 50 mL/min — ABNORMAL LOW (ref >90)
Glucose, Bld: 353 mg/dL — ABNORMAL HIGH (ref 70–99)
Potassium: 3.9 mEq/L (ref 3.5–5.1)
Potassium: 3.5 mEq/L (ref 3.5–5.1)
Sodium: 139 mEq/L (ref 135–145)
Sodium: 140 mEq/L (ref 135–145)

## 2013-09-19 LAB — GLUCOSE, CAPILLARY
Glucose-Capillary: 346 mg/dL — ABNORMAL HIGH (ref 70–99)
Glucose-Capillary: 365 mg/dL — ABNORMAL HIGH (ref 70–99)
Glucose-Capillary: 334 mg/dL — ABNORMAL HIGH (ref 70–99)

## 2013-09-19 LAB — URINALYSIS, ROUTINE W REFLEX MICROSCOPIC
Bilirubin Urine: NEGATIVE
Glucose, UA: >1000 mg/dL — AB
Ketones, ur: >80 mg/dL — AB
Nitrite: NEGATIVE
Specific Gravity, Urine: 1.027 (ref 1.005–1.030)
Urobilinogen, UA: 0.2 mg/dL (ref 0.0–1.0)
pH: 6 (ref 5.0–8.0)

## 2013-09-19 LAB — CBC WITH DIFFERENTIAL/PLATELET
Basophils Relative: 0 % (ref 0–1)
Eosinophils Absolute: 0 10*3/uL (ref 0.0–0.7)
Eosinophils Relative: 0 % (ref 0–5)
HCT: 30.8 % — ABNORMAL LOW (ref 36.0–46.0)
Hemoglobin: 10.2 g/dL — ABNORMAL LOW (ref 12.0–15.0)
Lymphocytes Relative: 14 % (ref 12–46)
Lymphs Abs: 1.2 10*3/uL (ref 0.7–4.0)
MCHC: 33.1 g/dL (ref 30.0–36.0)
MCV: 82.4 fL (ref 78.0–100.0)
Monocytes Absolute: 0.2 10*3/uL (ref 0.1–1.0)
Monocytes Relative: 3 % (ref 3–12)
RBC: 3.74 MIL/uL — ABNORMAL LOW (ref 3.87–5.11)
WBC: 8.2 10*3/uL (ref 4.0–10.5)

## 2013-09-19 LAB — URINALYSIS, MICROSCOPIC ONLY

## 2013-09-19 LAB — BASIC METABOLIC PANEL WITH GFR
CO2: 28 mEq/L (ref 19–32)
Chloride: 89 mEq/L — ABNORMAL LOW (ref 96–112)
Sodium: 134 mEq/L — ABNORMAL LOW (ref 135–145)

## 2013-09-19 MED ORDER — DEXTROSE-NACL 5-0.45 % IV SOLN: INTRAVENOUS | Status: DC

## 2013-09-19 MED ORDER — POTASSIUM CHLORIDE 10 MEQ/100ML IV SOLN
10.0000 meq | INTRAVENOUS | Status: AC
  Administered 2013-09-19 (×2): 10 meq via INTRAVENOUS
  Filled 2013-09-19 (×2): qty 100

## 2013-09-19 MED ORDER — INSULIN ASPART 100 UNIT/ML ~~LOC~~ SOLN
0.0000 [IU] | SUBCUTANEOUS | Status: DC
  Administered 2013-09-20: 2 [IU] via SUBCUTANEOUS

## 2013-09-19 MED ORDER — ONDANSETRON HCL 4 MG/2ML IJ SOLN: 4.0000 mg | Freq: Four times a day (QID) | INTRAMUSCULAR | Status: DC | PRN

## 2013-09-19 MED ORDER — ALBUTEROL SULFATE HFA 108 (90 BASE) MCG/ACT IN AERS
1.0000 | INHALATION_SPRAY | Freq: Four times a day (QID) | RESPIRATORY_TRACT | Status: DC | PRN
  Filled 2013-09-19: qty 6.7

## 2013-09-19 MED ORDER — PROMETHAZINE HCL 25 MG/ML IJ SOLN
12.5000 mg | Freq: Once | INTRAMUSCULAR | Status: AC
  Administered 2013-09-19: 12.5 mg via INTRAMUSCULAR

## 2013-09-19 MED ORDER — ENOXAPARIN SODIUM 40 MG/0.4ML ~~LOC~~ SOLN
40.0000 mg | SUBCUTANEOUS | Status: DC
  Administered 2013-09-19: 40 mg via SUBCUTANEOUS
  Filled 2013-09-19 (×2): qty 0.4

## 2013-09-19 MED ORDER — PROMETHAZINE HCL 25 MG/ML IJ SOLN: 12.5000 mg | Freq: Once | INTRAMUSCULAR | Status: DC

## 2013-09-19 MED ORDER — INFLUENZA VAC SPLIT QUAD 0.5 ML IM SUSP
0.5000 mL | INTRAMUSCULAR | Status: AC
  Administered 2013-09-20: 0.5 mL via INTRAMUSCULAR
  Filled 2013-09-19: qty 0.5

## 2013-09-19 MED ORDER — METOCLOPRAMIDE HCL 5 MG/ML IJ SOLN
5.0000 mg | Freq: Three times a day (TID) | INTRAMUSCULAR | Status: DC
  Administered 2013-09-19 – 2013-09-20 (×2): 5 mg via INTRAVENOUS
  Filled 2013-09-19 (×5): qty 1

## 2013-09-19 MED ORDER — SODIUM CHLORIDE 0.9 % IV SOLN: INTRAVENOUS | Status: DC

## 2013-09-19 MED ORDER — SODIUM CHLORIDE 0.9 % IV SOLN
INTRAVENOUS | Status: DC
  Administered 2013-09-19: 21:00:00 via INTRAVENOUS
  Administered 2013-09-20: 1000 mL via INTRAVENOUS

## 2013-09-19 MED ORDER — INSULIN GLARGINE 100 UNIT/ML ~~LOC~~ SOLN
10.0000 [IU] | Freq: Every day | SUBCUTANEOUS | Status: DC
  Administered 2013-09-19: 10 [IU] via SUBCUTANEOUS
  Filled 2013-09-19 (×2): qty 0.1

## 2013-09-19 MED ORDER — SODIUM CHLORIDE 0.9 % IV SOLN: INTRAVENOUS | Status: AC

## 2013-09-19 MED ORDER — SODIUM CHLORIDE 0.9 % IV SOLN
Freq: Once | INTRAVENOUS | Status: AC
  Administered 2013-09-19: 17:00:00 via INTRAVENOUS

## 2013-09-19 MED ORDER — SODIUM CHLORIDE 0.9 % IV SOLN
INTRAVENOUS | Status: DC
  Administered 2013-09-19: 2.7 [IU]/h via INTRAVENOUS
  Filled 2013-09-19: qty 1

## 2013-09-19 MED ORDER — DEXTROSE 50 % IV SOLN: 25.0000 mL | INTRAVENOUS | Status: DC | PRN

## 2013-09-19 NOTE — Telephone Encounter (Signed)
Pt called - has been vomiting yellow mucous since 09/18/13 about every 30 minutes. Had sore throat 09/17/13. Pt denies diarrhea, temp or cough. CBG this AM stated high - no numbers. Appt made today 1:45PM Dr Hayes Ludwig. Pt aware. Hilda Blades Zelpha Messing RN 09/19/13 11AM

## 2013-09-19 NOTE — Assessment & Plan Note (Signed)
She has anion gap of 17, urine ketones of 80, with blood glucose of 374. She denies missing doses of her insulin, precipitating etiology likely her sorethroat with N/V/D with decreased intake per mouth.  Phenergan 12.5mg  IM, once BMET, CBC, Urinalysis, lipase, STAT Rapid Strep test and mono step Insert IV, 1L NS bolus Admit to inpatient, pt to telemetry for glucose stabilization.

## 2013-09-19 NOTE — Progress Notes (Signed)
  Subjective:    Patient ID: Angela [name], female    DOB: 1986/04/15, 27 y.o.   MRN: [id number]  Diabetes Hypoglycemia symptoms include dizziness. Pertinent negatives for hypoglycemia include no headaches. Pertinent negatives for diabetes include no chest pain, no fatigue and no weakness.   Angela [name] is a pleasant 27 year old woman with PMH of CKD stage 3, DM1, and anemia who presents for evaluation of persistent nausea and vomiting. She states that she had a sore throat on Monday night with subjective chills and on Tuesday woke up with nausea and vomiting with no bloody emesis. She has been unable to tolerate any intake per mouth since then. She also had multiple episodes of non bloody diarrhea on Tuesday but this has subsided. Yesterday she starting feeling tired with chest palpitations. Her sore throat has improved, she denies difficulty swallowing or voice changes.  She has been taking her insulin, Lantus 15 units, and Novolog sliding scale but notes that for the past 2 days her CBG meter has been reading "High" and she does not know the exact numbers.  She denies dysuria, back pain, contact with sick persons, vaginal discharge, or body aches.  She has mild epigastric pain.      Review of Systems  Constitutional: Positive for fever, chills and appetite change. Negative for diaphoresis, activity change and fatigue.  HENT: Positive for sore throat. Negative for trouble swallowing and voice change.   Eyes: Negative for itching.  Respiratory: Negative for cough, shortness of breath and wheezing.   Cardiovascular: Positive for palpitations. Negative for chest pain and leg swelling.  Gastrointestinal: Positive for nausea, vomiting, abdominal pain and diarrhea. Negative for blood in stool.  Genitourinary: Negative for dysuria, frequency, flank pain, vaginal discharge and vaginal pain.  Skin: Negative for rash.  Neurological: Positive for dizziness and light-headedness. Negative for weakness and  headaches.  Hematological: Positive for adenopathy.  Psychiatric/Behavioral: Negative for behavioral problems and agitation.       Objective:   Physical Exam  Nursing note and vitals reviewed. Constitutional: She is oriented to person, place, and time. She appears well-developed and well-nourished. She appears distressed.  HENT:  Mild tonsillar erythema and edema bilaterally with no exudate.  Dry MM   Eyes: Conjunctivae are normal. No scleral icterus.  Neck:  Tender right anterior cervical adenopathy  Cardiovascular:  Mild tachycardia  Pulmonary/Chest: Effort normal and breath sounds normal. No respiratory distress. She has no wheezes. She has no rales.  Abdominal: Soft. She exhibits no distension and no mass. There is tenderness. There is no rebound and no guarding.  Hypoactive BS Epigastric tenderness with deep palpation  Musculoskeletal: She exhibits no edema and no tenderness.  Lymphadenopathy:    She has cervical adenopathy.  Neurological: She is alert and oriented to person, place, and time.  Skin: Skin is warm and dry. No rash noted. She is not diaphoretic. No erythema. There is pallor.  Psychiatric: She has a normal mood and affect. Her behavior is normal.          Assessment & Plan:

## 2013-09-19 NOTE — H&P (Signed)
Date: 09/19/2013               Patient Name:  Angela [name] MRN: [id number]  DOB: [birth year] Age / Sex: 27 y.o., female   PCP: Juluis Mire, MD         Medical Service: Internal Medicine Teaching Service         Attending Physician: Dr. Michel Bickers, MD    First Contact: Dr. Lucila Maine Pager: [phone]  Second Contact: Dr. Michail Sermon Pager: [phone]       After Hours (After 5p/  First Contact Pager: [phone]  weekends / holidays): Second Contact Pager: [phone]   Chief Complaint: nausea/vomiting  History of Present Illness:  Angela [name] is a 27 year old woman with history of DM1 (A1C of 10.7 on 09/08/13), HTN, HL, CKD3, hypothyroidism (s/p ablation for toxic multinodular goiter), GERD, and depression who presents with nausea/vomiting x 3 days.  She was recently discharged on 9/23 after an admission for hypoglycemia with acute encephalopathy.  Patient started feeling poorly on Monday 9/29 when she began having diarrhea (runny, brown, no blood) and sore throat but both of these issues have since improved/resolved.  She had been visiting her sister and nephew over the weekend; the child was sick with a cold.  She tried taking Mucinex for her sore throat but then began having intermittent chest pain "like someone was punching" her in the chest.  On Tuesday 9/30, she began having nausea/vomiting, 4-8 times/day with associated mild epigastric pain; vomitus has been yellow, no blood.  She continues to feel nauseated and states she has vomited twice since being in the hospital today.  She has had poor PO intake since Tuesday, stating she has not eaten anything since that time but has been able to drink with medications and keep them down.  Endorses chills but no fevers.   In terms of her diabetes, she has been taking Lantus 15 units at night and Novolog SSI (though she was told to discontinue Novolog at discharge on 9/23 to avoid hypoglycemia).  Her glucometer has frequently been reading "high" in the past  couple of days.  She has also had two episodes of hypoglycemia this week, most recently yesterday morning.  She felt dizzy and her lip was numb at the time.  She drank OJ, and BG came up.  She endorses polyuria but no dysuria, urgency, hematuria, or foul smelling urine.   Endorses right ear pain. Denies headache, congestion, chest pain, shortness of breath, lightheadedness, weakness.  Meds: Current Facility-Administered Medications  Medication Dose Route Frequency Provider Last Rate Last Dose  . 0.9 %  sodium chloride infusion   Intravenous Continuous Olga Millers, MD      . 0.9 %  sodium chloride infusion   Intravenous Continuous Olga Millers, MD 150 mL/hr at 09/19/13 2056    . albuterol (PROVENTIL HFA;VENTOLIN HFA) 108 (90 BASE) MCG/ACT inhaler 1-2 puff  1-2 puff Inhalation Q6H PRN Olga Millers, MD      . dextrose 50 % solution 25 mL  25 mL Intravenous PRN Olga Millers, MD      . enoxaparin (LOVENOX) injection 40 mg  40 mg Subcutaneous Q24H Olga Millers, MD   40 mg at 09/19/13 2119  . [START ON 09/20/2013] influenza vac split quadrivalent PF (FLUARIX) injection 0.5 mL  0.5 mL Intramuscular Tomorrow-1000 Michel Bickers, MD      . insulin glargine (LANTUS) injection 10 Units  10 Units Subcutaneous QHS Olga Millers, MD      .  potassium chloride 10 mEq in 100 mL IVPB  10 mEq Intravenous Q1H Olga Millers, MD   10 mEq at 09/19/13 2117   Facility-Administered Medications Ordered in Other Encounters  Medication Dose Route Frequency Provider Last Rate Last Dose  . promethazine (PHENERGAN) injection 12.5 mg  12.5 mg Intravenous Once Martie Lee, PA-C      home meds- Lantus 15 units qhs, Novolog 5 units TID, levothyroxine 100 mcg daily, lisinopril 10 mg daily, metoclopromide 5 mg TID before meals, pravastatin 20 mg daily  Allergies: Allergies as of 09/19/2013 - Review Complete 09/19/2013  Allergen Reaction Noted  . Ciprofloxacin Hives 03/02/2012  .  Sulfamethoxazole Itching 09/14/2006  . Trimethoprim Hives 09/14/2006   Past Medical History  Diagnosis Date  . HLD (hyperlipidemia)   . Depression, major     was on multiple medication before followed by psych but was lost to follow up 2-3 years ago when she go arrested, stopped multiple medications that she was on (zoloft, abilify, depakote) , never restarted it  . Anemia     baseline Hb 10-11, ferriting 53  . 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 Asthma   . Abnormal Pap smear of cervix     ascus noted 2007  . Gastritis   . Hypertension   . DM type 1 (diabetes mellitus, type 1)     uncontrolled due to medication non compliance, DKA admission at [facility] in 2008, Dx age 65   . GERD (gastroesophageal reflux disease)   . Neuromuscular disorder     DIABETIC NEUROPATHY   . Hypothyroidism     untreated, non compliance   Past Surgical History  Procedure Laterality Date  . Foot fusion Right 2006    "put screws in it too" (09/19/2013)   Family History  Problem Relation Age of Onset  . Multiple sclerosis Mother   . Hypothyroidism Mother   . Hyperlipidemia Maternal Grandmother   . Hypertension Maternal Grandfather   . Hypertension Maternal Grandmother   . Prostate cancer Maternal Grandfather   . Diabetes type I Maternal Grandfather   . Breast cancer Paternal Grandmother   . Heart disease Maternal Grandmother     unknown type  . Stroke Mother     at age 85 yo   History   Social History  . Marital Status: Divorced    Spouse Name: N/A    Number of Children: 0  . Years of Education: 10th grade   Occupational History  . unemployed     worked at a group   Social History Main Topics  . Smoking status: Former Smoker -- 0.25 packs/day for 2 years    Types: Cigarettes    Quit date: 03/04/2013  . Smokeless tobacco: Never Used  . Alcohol Use: No  . Drug Use: Yes    Special: Marijuana     Comment: 09/19/2013 "last time I used was 2 days ago"  . Sexual Activity: Yes    Birth  Control/ Protection: None   Other Topics Concern  . Not on file   Social History Narrative   Occupation: currently unemployed   Single   Homosexual,     Used to be a gang member, got arrested for robbing a gas station (March - June 2012), is cleared now and lives away from her previous friends.    Living with her current partner in [place].  Mother still active in her life.  mother who is an Glass blower/designer in [employer].  Smoking Status:  Current, Packs/Day:  1   Sexual History:  multiple partners in the past, same sex encounters,current partner is a CNA and she is planning to move in with her   Drug Use:  Marijuana, denies cocaine, heroin, or amphetamines.          Patient endorsed THC use, last on 9/30, but less than previously, now "one blunt a day."  Denies EtOH, other illicits.   Review of Systems: Review of Systems  Constitutional: Positive for chills. Negative for fever, weight loss and diaphoresis.  HENT: Positive for ear pain and sore throat. Negative for congestion.   Eyes: Negative for blurred vision.  Respiratory: Negative for cough and shortness of breath.   Cardiovascular: Positive for chest pain. Negative for palpitations and leg swelling.  Gastrointestinal: Positive for nausea, vomiting, abdominal pain and diarrhea. Negative for blood in stool and melena.  Genitourinary: Positive for frequency. Negative for dysuria, urgency, hematuria and flank pain.  Musculoskeletal: Negative for falls.  Neurological: Negative for dizziness, tingling, focal weakness, loss of consciousness, weakness and headaches.    Physical Exam: Blood pressure 154/98, pulse 100, temperature 99.2 F (37.3 C), temperature source Oral, resp. rate 20, last menstrual period 09/08/2013, SpO2 99.00%. General: alert, cooperative, and in no apparent distress HEENT: NCAT, vision grossly intact, oropharynx clear and non-erythematous  Neck: supple, no lymphadenopathy Lungs: clear to ascultation  bilaterally, normal work of respiration, no wheezes, rales, ronchi Heart: regular rate and rhythm, no murmurs, gallops, or rubs Abdomen: TTP in epigastric area, soft, non-distended, normal bowel sounds Extremities: 2+ DP/PT pulses bilaterally, no cyanosis, clubbing, or edema Neurologic: alert & oriented X3, cranial nerves II-XII intact, strength grossly intact, sensation intact to light touch  Lab results: Basic Metabolic Panel:  Recent Labs  09/19/13 1420 09/19/13 2036  NA 134* 139  K 3.6 3.9  CL 89* 98  CO2 28 29  GLUCOSE 374* 353*  BUN 19 18  CREATININE 1.60* 1.41*  CALCIUM 9.7 8.9  AG = 17, repeat AG = 12   Recent Labs  09/19/13 1420  LIPASE 26   CBC:  Recent Labs  09/19/13 1420  WBC 8.2  NEUTROABS 6.8  HGB 10.2*  HCT 30.8*  MCV 82.4  PLT 233   CBG:  Recent Labs  09/19/13 1412 09/19/13 1744 09/19/13 1832 09/19/13 2047  GLUCAP 346* 372* 365* 334*   Urine Drug Screen: Drugs of Abuse     Component Value Date/Time   LABOPIA NONE DETECTED 09/08/2013 1206   COCAINSCRNUR POSITIVE* 09/08/2013 1206   LABBENZ NONE DETECTED 09/08/2013 1206   AMPHETMU NONE DETECTED 09/08/2013 1206   THCU POSITIVE* 09/08/2013 1206   LABBARB NONE DETECTED 09/08/2013 1206    Urinalysis:  Recent Labs  09/19/13 1420  COLORURINE YELLOW  LABSPEC 1.027  PHURINE 6.0  GLUCOSEU > 1000*  HGBUR MOD*  BILIRUBINUR NEG  KETONESUR > 80*  PROTEINUR > 300*  UROBILINOGEN 0.2  NITRITE NEG  LEUKOCYTESUR NEG    Other results: EKG: pending  Assessment & Plan by Problem: #DKA- Pt presented to clinic with hyperglycemia (CBG 346, 374 on BMP) and AG of 17; potassium was 3.6.  Her UA was positive for ketones. She has uncontrolled diabetes type 1 with last A1C 10.7% on 09/08/13, c/b gastroparesis.  Etiology for DKA likely nausea/vomiting and decreased PO intake for the last two days which led to dehydration (along with ?compliance with insulin).  Pt received NS 1L in clinic followed by 1L on  floor + 150 cc/hr;  1L NS pending.  Given BMP at 2:20p with AG 17, initiated insulin gtt around 8:15p but then repeat BMP at 8:36p showed AG 12 so gave Lantus 10 units with discontinuation of insulin gtt in 2 hours.  BMP at  9:48p showed AG 11.  -admitted to med surg telemetry floor (possibly due to unavailability of step down bed for admission from clinic) -DKA protocol was initiated temporarily but soon after, AG was closed x 2 (12, 11) so gave Lantus 10 units, SSI sensitive with discontinuation of insulin gtt 2 hrs later -NPO except ice chips until AM -IVFs per above -CBGs q4h -Reglan 5 mg IV q8h (pt takes 5mg  TID at home), Zofran 4 mg IV q6h prn nausea -EKG pending; troponin x 1 given history of chest pain earlier this week -UDS pending (positive for cocaine and THC at last admission) -BMP in AM  #Hypothyroidism- Pt endorses good medication compliance with Synthroid at home.  TSH elevated at 141.2 on 09/08/13 but pt was not compliant at that time.  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  -IVFs per above  #HTN- stable, 140s/90s; on lisinopril 10 mg daily. -holding lisinopril for now until nausea resolves   #DVT PPX- lovenox  #Code status- Full code  Dispo: Disposition is deferred at this time, awaiting improvement of current medical problems. Anticipated discharge in approximately 2-3 day(s).   The patient does have a current PCP (Juluis Mire, MD) and does need an [facility] hospital follow-up appointment after discharge.  Signed: Ivin Poot, MD 09/19/2013, 10:14 PM   Attending addendum: I have seen and examined Angela [name] with my medical team and discussed her management. I do not think that she had full blown DKA. She has poor glycemic control recently and acute hyperglycemia with ketosis but no acidosis. It seems as though she has been struggling with anorexia and early satiety  over the last several months and has lost 15 pounds. However, it seems that her recent admission with hypoglycemia has given her a greater commitment to managing her diabetes better. Over the last several weeks he has been checking her blood sugar 3 times daily. She is feeling much better after IV fluid and will probably be ready for discharge home later today if she is tolerating a regular diabetic diet.  Michel Bickers, MD [facility] [phone] pager   [phone] cell 09/20/2013, 2:35 PM

## 2013-09-19 NOTE — Progress Notes (Signed)
Pt arrived to the floor at 1730, CBG value is 372. I paged and talked to attending physician team for admission order. Pt VS are stable. Will continue to monitor. Ferdinand Lango, RN

## 2013-09-20 DIAGNOSIS — I129 Hypertensive chronic kidney disease with stage 1 through stage 4 chronic kidney disease, or unspecified chronic kidney disease: Secondary | ICD-10-CM

## 2013-09-20 DIAGNOSIS — E039 Hypothyroidism, unspecified: Secondary | ICD-10-CM

## 2013-09-20 DIAGNOSIS — B2790 Infectious mononucleosis, unspecified without complication: Secondary | ICD-10-CM | POA: Diagnosis present

## 2013-09-20 DIAGNOSIS — E1065 Type 1 diabetes mellitus with hyperglycemia: Secondary | ICD-10-CM

## 2013-09-20 DIAGNOSIS — J020 Streptococcal pharyngitis: Secondary | ICD-10-CM | POA: Diagnosis present

## 2013-09-20 LAB — GLUCOSE, CAPILLARY
Glucose-Capillary: 122 mg/dL — ABNORMAL HIGH (ref 70–99)
Glucose-Capillary: 182 mg/dL — ABNORMAL HIGH (ref 70–99)
Glucose-Capillary: 256 mg/dL — ABNORMAL HIGH (ref 70–99)
Glucose-Capillary: 80 mg/dL (ref 70–99)
Glucose-Capillary: 154 mg/dL — ABNORMAL HIGH (ref 70–99)

## 2013-09-20 LAB — BASIC METABOLIC PANEL
BUN: 16 mg/dL (ref 6–23)
CO2: 29 mEq/L (ref 19–32)
Chloride: 99 mEq/L (ref 96–112)
GFR calc Af Amer: 59 mL/min — ABNORMAL LOW (ref >90)
Glucose, Bld: 87 mg/dL (ref 70–99)
Potassium: 3.5 mEq/L (ref 3.5–5.1)
Sodium: 138 mEq/L (ref 135–145)

## 2013-09-20 LAB — MONONUCLEOSIS SCREEN: Mono Screen: POSITIVE — AB

## 2013-09-20 LAB — RAPID STREP SCREEN (MED CTR MEBANE ONLY): Streptococcus, Group A Screen (Direct): POSITIVE — AB

## 2013-09-20 LAB — TROPONIN I: Troponin I: <0.3 ng/mL (ref <0.30)

## 2013-09-20 MED ORDER — INSULIN GLARGINE 100 UNIT/ML ~~LOC~~ SOLN: 20.0000 [IU] | Freq: Every day | SUBCUTANEOUS | Status: DC

## 2013-09-20 MED ORDER — LEVOTHYROXINE SODIUM 100 MCG PO TABS
100.0000 ug | ORAL_TABLET | Freq: Every day | ORAL | Status: DC
  Administered 2013-09-20: 100 ug via ORAL
  Filled 2013-09-20 (×2): qty 1

## 2013-09-20 MED ORDER — INSULIN ASPART 100 UNIT/ML ~~LOC~~ SOLN: 1.0000 [IU] | SUBCUTANEOUS | Status: DC | PRN

## 2013-09-20 MED ORDER — INSULIN ASPART 100 UNIT/ML ~~LOC~~ SOLN: 1.0000 [IU] | Freq: Three times a day (TID) | SUBCUTANEOUS | Status: DC

## 2013-09-20 MED ORDER — INSULIN ASPART 100 UNIT/ML ~~LOC~~ SOLN: 1.0000 [IU] | Freq: Four times a day (QID) | SUBCUTANEOUS | Status: DC

## 2013-09-20 MED ORDER — INSULIN GLARGINE 100 UNIT/ML ~~LOC~~ SOLN
20.0000 [IU] | Freq: Every day | SUBCUTANEOUS | Status: DC
  Filled 2013-09-20: qty 0.2

## 2013-09-20 MED ORDER — SIMVASTATIN 10 MG PO TABS
10.0000 mg | ORAL_TABLET | Freq: Every day | ORAL | Status: DC
  Filled 2013-09-20: qty 1

## 2013-09-20 MED ORDER — LISINOPRIL 10 MG PO TABS
10.0000 mg | ORAL_TABLET | Freq: Every day | ORAL | Status: DC
  Administered 2013-09-20: 10 mg via ORAL
  Filled 2013-09-20: qty 1

## 2013-09-20 NOTE — Progress Notes (Signed)
Inpatient Diabetes Program Recommendations  AACE/ADA: New Consensus Statement on Inpatient Glycemic Control (2013)  Target Ranges:  Prepandial:   less than 140 mg/dL      Peak postprandial:   less than 180 mg/dL (1-2 hours)      Critically ill patients:  140 - 180 mg/dL   Reason for Visit: Results for Angela, [name] (MRN [id number]) as of 09/20/2013 14:58  Ref. Range 09/20/2013 07:54 09/20/2013 08:52 09/20/2013 11:49  Glucose-Capillary Latest Range: 70-99 mg/dL 69 (L) 80 154 (H)     Spoke to patient at length regarding her diabetes.  She has had Type 1 diabetes since age 54.  Prior to admit she was taking Lantus 15 units daily and Novolog 5 units if greater than 250 mg/dL and Novolog 10 units if greater than 400 mg/dL.  In the past she has covered CHO 1 unit for every 15 grams CHO.  She admits that she is not eating much due to feeling full all the time which she states is due to gastroparesis.     Discussed in detail the physiologic secretion of insulin and Type 1 diabetes.  Patient and mother were tearful and patient states "I want to do better".  She seems motivated to improve control.  Discussed JDRF resources also.  Consider changing Novolog regimen to 2 component method including Novolog correction and CHO coverage.  Patient would cover 1 unit for every 15 grams of CHO.  Due to recent hypoglycemia, may want goal CBG to be 150 mg/dL and 1 unit for every 50 mg/dL greater than 150 mg/dl. Therefore correction scale would be: Novolog  150-200 mg/dL=1 unit 201-250 mg/dL=2 units 251-300 mg/dL=3 units 301-350 mg/dL=4 units 351-400 mg/dL=5 units 401-450 mg/dL=6 units  This would be a very conservative insulin regimen.   May also need increased Lantus dose as patient states she frequently wakes up with elevated CBG's.  Consider increasing Lantus to 20 units daily.  Needs close follow-up with Barnabas Harries and will likely need insulin adjustments upward based on patients weight.   Note: At 0.8  units/kg, patient's total daily dose of insulin would be approximately 56 units in a 24 hour period.  If 1/2 was given as basal, this would be 28 units of Lantus, Correction factor of 1 unit drops CBG approximately 30 mg/dL, and 1 unit would cover approximately 10 grams of CHO. However, due to hypoglycemia would start insulin at much lower dose and titrate slowly.   Needs close follow-up with Barnabas Harries, CDE and PCP.

## 2013-09-20 NOTE — Progress Notes (Signed)
INITIAL NUTRITION ASSESSMENT  DOCUMENTATION CODES Per approved criteria  -Not Applicable   INTERVENTION:  Inpatient diet education provided.  [facility] nutrition [facility] at [facility].  NUTRITION DIAGNOSIS: Inadequate oral intake related to uncontrolled glucose as evidenced by 10 lb weight loss in one week.   Goals: Intake to meet >90% of estimated nutrition needs. Optimal glucose control.  Monitor:  Diet advancement, PO intake, labs, weight trend.  Reason for Assessment: MST=2  27 y.o. female  Admitting Dx: DKA (diabetic ketoacidoses)  ASSESSMENT: Patient was admitted due to DKA. History of diarrhea, nausea, vomiting, and sore throat with minimal oral intake over the past week. Patient reports that she has had a very poor appetite, prefers drinking juice and water than eating food. Has lost 10 lb in the past week. Poor appetite and weight loss likely related to elevated glucoses.  Nutrition focused physical exam completed.  No muscle or subcutaneous fat depletion noticed.  Height: Ht Readings from Last 1 Encounters:  09/12/13 5\' 5"  (1.651 m)    Weight: Wt Readings from Last 1 Encounters:  09/20/13 158 lb 6.4 oz (71.85 kg)    Ideal Body Weight: 56.8 kg  % Ideal Body Weight: 126%  Wt Readings from Last 10 Encounters:  09/20/13 158 lb 6.4 oz (71.85 kg)  09/19/13 155 lb 14.4 oz (70.716 kg)  09/12/13 162 lb 1.6 oz (73.528 kg)  09/08/13 158 lb 11.7 oz (72 kg)  06/13/13 158 lb 1.6 oz (71.714 kg)  06/13/13 160 lb 1.6 oz (72.621 kg)  06/06/13 158 lb 14.4 oz (72.077 kg)  04/22/13 165 lb 12.8 oz (75.206 kg)  01/07/13 164 lb 11.2 oz (74.707 kg)  11/01/12 157 lb (71.215 kg)    Usual Body Weight: 165 lb per patient  % Usual Body Weight: 96%  BMI:  Body mass index is 26.36 kg/(m^2).  Estimated Nutritional Needs: Kcal: 1800-2000 Protein: 70-80 gm Fluid: 1.8-2 L  Skin: no wounds  Diet Order: Carb Control  EDUCATION NEEDS: -Education  needs addressed--provided education on CHO-counting; [facility] for [facility] education.   Intake/Output Summary (Last 24 hours) at 09/20/13 1103 Last data filed at 09/20/13 0600  Gross per 24 hour  Intake   1560 ml  Output      0 ml  Net   1560 ml    Last BM: 10/2   Labs:   Recent Labs Lab 09/19/13 2036 09/19/13 2148 09/20/13 0425  NA 139 140 138  K 3.9 3.5 3.5  CL 98 99 99  CO2 29 30 29   BUN 18 18 16   CREATININE 1.41* 1.47* 1.40*  CALCIUM 8.9 8.8 8.8  GLUCOSE 353* 289* 87    CBG (last 3)   Recent Labs  09/20/13 0038 09/20/13 0754 09/20/13 0852  GLUCAP 122* 69* 80    Scheduled Meds: . enoxaparin (LOVENOX) injection  40 mg Subcutaneous Q24H  . insulin aspart  0-9 Units Subcutaneous Q4H  . insulin glargine  10 Units Subcutaneous QHS  . metoCLOPramide (REGLAN) injection  5 mg Intravenous Q8H    Continuous Infusions: . sodium chloride 150 mL/hr at 09/19/13 2056    Past Medical History  Diagnosis Date  . HLD (hyperlipidemia)   . Depression, major     was on multiple medication before followed by psych but was lost to follow up 2-3 years ago when she go arrested, stopped multiple medications that she was on (zoloft, abilify, depakote) , never restarted it  . Anemia  baseline Hb 10-11, ferriting 53  . 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 Asthma   . Abnormal Pap smear of cervix     ascus noted 2007  . Gastritis   . Hypertension   . DM type 1 (diabetes mellitus, type 1)     uncontrolled due to medication non compliance, DKA admission at [facility] in 2008, Dx age 52   . GERD (gastroesophageal reflux disease)   . Neuromuscular disorder     DIABETIC NEUROPATHY   . Hypothyroidism     untreated, non compliance    Past Surgical History  Procedure Laterality Date  . Foot fusion Right 2006    "put screws in it too" (09/19/2013)    Molli Barrows, [place], [place], [place] Pager [phone] After Hours Pager [phone]

## 2013-09-20 NOTE — Progress Notes (Signed)
Subjective: NAEON. Pt feels well this morning. Denies nausea, sore throat, chest pain, SOB, or abdominal pain.  Since her last hospital admission on 09/08/13, pt states that she has been strictly adhering to taking all of her medications. She checks her blood glucose at home three times a day and takes her insulin as instructed. She does report decreased appetite and early satiety which she attributes to her gastroparesis. She states that she sometimes has erratic glucose values and this is related to her skipping meals and her irregular eating pattern. She reports a 15 lb unintentional weight loss over the past 2 months. However, she has a good support system at home and her wife encourages her to eat frequent small meals. She reports that she is working on getting her A1C controlled and is doing a better job taking care of her health since her last hospital admission.  Objective: Vital signs in last 24 hours: Filed Vitals:   09/19/13 1757 09/19/13 2000 09/20/13 0545  BP: 145/88 154/98 145/96  Pulse: 100 100 97  Temp: 98.8 F (37.1 C) 99.2 F (37.3 C) 98.5 F (36.9 C)  TempSrc: Oral Oral Axillary  Resp: 20    Weight:   71.85 kg (158 lb 6.4 oz)  SpO2: 99% 99% 96%    Intake/Output Summary (Last 24 hours) at 09/20/13 1153 Last data filed at 09/20/13 0600  Gross per 24 hour  Intake   1560 ml  Output      0 ml  Net   1560 ml   Physical Exam  Vitals reviewed. Constitutional: She is well-developed, well-nourished, and in no distress.  Cardiovascular: Normal rate, regular rhythm, S1 normal, S2 normal and normal heart sounds.  Exam reveals no gallop.   No murmur heard. Pulmonary/Chest: Effort normal and breath sounds normal. No respiratory distress.  Abdominal: Soft. Bowel sounds are normal. There is tenderness in the epigastric area.  Skin: Skin is warm and dry.   Lab Results: Basic Metabolic Panel:  Recent Labs Lab 09/19/13 2148 09/20/13 0425  NA 140 138  K 3.5 3.5  CL 99 99    CO2 30 29  GLUCOSE 289* 87  BUN 18 16  CREATININE 1.47* 1.40*  CALCIUM 8.8 8.8    Recent Labs Lab 09/19/13 1420  LIPASE 26   CBC:  Recent Labs Lab 09/19/13 1420  WBC 8.2  NEUTROABS 6.8  HGB 10.2*  HCT 30.8*  MCV 82.4  PLT 233   Cardiac Enzymes:  Recent Labs Lab 09/19/13 2345  TROPONINI <0.30   CBG:  Recent Labs Lab 09/19/13 2047 09/19/13 2201 09/19/13 2323 09/20/13 0038 09/20/13 0754 09/20/13 0852  GLUCAP 334* 256* 182* 122* 69* 80   Urinalysis:  Recent Labs Lab 09/19/13 1420  COLORURINE YELLOW  LABSPEC 1.027  PHURINE 6.0  GLUCOSEU > 1000*  HGBUR MOD*  BILIRUBINUR NEG  KETONESUR > 80*  PROTEINUR > 300*  UROBILINOGEN 0.2  NITRITE NEG  LEUKOCYTESUR NEG   Micro Results: Recent Results (from the past 240 hour(s))  RAPID STREP SCREEN     Status: Abnormal   Collection Time    09/19/13  3:04 PM      Result Value Range Status   Source ;   Final   Streptococcus, Group A Screen (Direct) POS (*) NEGATIVE Final   Studies/Results: No results found. Medications: I have reviewed the patient's current medications. Scheduled Meds: . enoxaparin (LOVENOX) injection  40 mg Subcutaneous Q24H  . insulin aspart  0-9 Units Subcutaneous Q4H  . insulin  glargine  10 Units Subcutaneous QHS  . [START ON 09/21/2013] levothyroxine  100 mcg Oral QAC breakfast  . lisinopril  10 mg Oral Daily  . metoCLOPramide (REGLAN) injection  5 mg Intravenous Q8H  . simvastatin  10 mg Oral q1800   Continuous Infusions: . sodium chloride 150 mL/hr at 09/19/13 2056   PRN Meds:.albuterol, dextrose, ondansetron (ZOFRAN) IV  Assessment/Plan: Ms. Schomburg is a 27 yo woman who was admitted from clinic with hyperglycemia, and subjective complaints of nausea/vomiting and sore throat x 3 days; symptoms now resolved.   # Uncontrolled Type 1 DM - Complicated by gastroparesis. Last A1C 10.7 on 09/08/13. On admission she had glucose 374, AG 17, K 3.6, proteinuria, ketonuria, and glucosuria.  She received 2L NS and was continued on 150 ml/hr overnight. She was initially started on insulin gtt but was transitioned to Lantus and SSI as her labs improved. K was replenished. Labs today reveal glucose 59, AG 11, and K 3.5. The etiology for her possible DKA on admission is likely dehydration caused by nausea/vomiting and decreased PO intake over the last week. Her hypoglycemia this morning is likely related to poor PO intake.  - Plan to PO challenge with breakfast. CBGs q4h. If patient tolerates breakfast, anticipate discharge today. - Reglan 5mg  IV q8hr and Zofran 4mg  IV q6hr prn nausea. Resume home Reglan after discharge for nausea associated with gastroparesis.  # CKD Stage 3 - likely secondary to diabetic nephropathy. Creatinine 1.60 on admission, now 1.40. Her baseline is 1.2-1.4 though this is unclear. This is likely representative of an AKI superimposed on CKD due to acute dehydration. Appears to be resolving with IVF administration.  # Hypothyroidism - Pt endorses good adherence to Synthroid at home.  - Resume home Synthroid.  # Hypertension - Stable. BPs 140s/90s. - Continue home lisinopril 10mg  qd.  # DVT Ppx - lovenox  # Code status - full  This is a Careers information officer Note.  The care of the patient was discussed with Dr. Michail Sermon and Dr. Megan Salon and the assessment and plan formulated with their assistance.  Please see their attached note for official documentation of the daily encounter.   LOS: 1 day   York Ram, Med Student 09/20/2013, 11:53 AM
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
## 2013-09-23 NOTE — Progress Notes (Signed)
Case discussed with Dr. Hayes Ludwig at the time of the visit.  We reviewed the resident's history and exam and pertinent patient test results.  I agree with the assessment, diagnosis and plan of care documented in the resident's note to include inpatient admission for management of her mild DKA.

## 2013-09-25 ENCOUNTER — Ambulatory Visit: Payer: Self-pay

## 2013-09-26 ENCOUNTER — Encounter: Payer: Self-pay | Admitting: Internal Medicine

## 2013-09-26 ENCOUNTER — Ambulatory Visit: Payer: Self-pay | Admitting: Internal Medicine

## 2013-09-26 ENCOUNTER — Ambulatory Visit: Payer: Self-pay

## 2013-10-21 ENCOUNTER — Ambulatory Visit: Payer: Self-pay

## 2013-10-22 ENCOUNTER — Ambulatory Visit: Payer: Self-pay

## 2014-02-21 ENCOUNTER — Telehealth: Payer: Self-pay | Admitting: *Deleted

## 2014-02-21 NOTE — Telephone Encounter (Signed)
Call made to patient regarding her need to return to office to see D. Hill to complete paperwork for [facility] card. 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  Spoke with pt, she's out of medication and glucoses has been running around 350, scheduled to see D.Hill on 02/25/14 at [place] and will see [facility] at 9:45am to address DM.  Pt spoke directly with financial counselor and was informed on what paperwork to bring to appt to renew her card.  Phone call complete.Despina Hidden Cassady3/6/20154:23 PM

## 2014-02-25 ENCOUNTER — Ambulatory Visit (INDEPENDENT_AMBULATORY_CARE_PROVIDER_SITE_OTHER): Payer: Self-pay | Admitting: Internal Medicine

## 2014-02-25 ENCOUNTER — Encounter: Payer: Self-pay | Admitting: Licensed Clinical Social Worker

## 2014-02-25 ENCOUNTER — Ambulatory Visit: Payer: Self-pay

## 2014-02-25 VITALS — BP 153/107 | HR 91 | Temp 97.0°F | Wt 153.1 lb

## 2014-02-25 DIAGNOSIS — E785 Hyperlipidemia, unspecified: Secondary | ICD-10-CM

## 2014-02-25 DIAGNOSIS — N183 Chronic kidney disease, stage 3 unspecified: Secondary | ICD-10-CM

## 2014-02-25 DIAGNOSIS — Z0000 Encounter for general adult medical examination without abnormal findings: Secondary | ICD-10-CM

## 2014-02-25 DIAGNOSIS — E1059 Type 1 diabetes mellitus with other circulatory complications: Secondary | ICD-10-CM

## 2014-02-25 DIAGNOSIS — F3289 Other specified depressive episodes: Secondary | ICD-10-CM

## 2014-02-25 DIAGNOSIS — E890 Postprocedural hypothyroidism: Secondary | ICD-10-CM

## 2014-02-25 DIAGNOSIS — Z23 Encounter for immunization: Secondary | ICD-10-CM

## 2014-02-25 DIAGNOSIS — T7491XA Unspecified adult maltreatment, confirmed, initial encounter: Secondary | ICD-10-CM

## 2014-02-25 DIAGNOSIS — F329 Major depressive disorder, single episode, unspecified: Secondary | ICD-10-CM

## 2014-02-25 DIAGNOSIS — I10 Essential (primary) hypertension: Secondary | ICD-10-CM

## 2014-02-25 DIAGNOSIS — N1831 Chronic kidney disease, stage 3a: Secondary | ICD-10-CM

## 2014-02-25 HISTORY — DX: Other family member, perpetrator of maltreatment and neglect: T74.91XA

## 2014-02-25 LAB — BASIC METABOLIC PANEL
BUN: 17 mg/dL (ref 6–23)
CALCIUM: 9.4 mg/dL (ref 8.4–10.5)
CO2: 27 meq/L (ref 19–32)
CREATININE: 1.6 mg/dL — AB (ref 0.50–1.10)
Chloride: 90 mEq/L — ABNORMAL LOW (ref 96–112)
GLUCOSE: 656 mg/dL — AB (ref 70–99)
Potassium: 5.2 mEq/L (ref 3.5–5.3)
Sodium: 130 mEq/L — ABNORMAL LOW (ref 135–145)

## 2014-02-25 LAB — GLUCOSE, CAPILLARY: Glucose-Capillary: 591 mg/dL (ref 70–99)

## 2014-02-25 LAB — LIPID PANEL
CHOL/HDL RATIO: 3.6 ratio
CHOLESTEROL: 235 mg/dL — AB (ref 0–200)
HDL: 65 mg/dL (ref >39)
LDL Cholesterol: 148 mg/dL — ABNORMAL HIGH (ref 0–99)
Triglycerides: 110 mg/dL (ref <150)
VLDL: 22 mg/dL (ref 0–40)

## 2014-02-25 LAB — TSH: TSH: 28.474 u[IU]/mL — AB (ref 0.350–4.500)

## 2014-02-25 LAB — POCT GLYCOSYLATED HEMOGLOBIN (HGB A1C): HEMOGLOBIN A1C: 10.4

## 2014-02-25 MED ORDER — INSULIN GLARGINE 100 UNIT/ML ~~LOC~~ SOLN: 24.0000 [IU] | Freq: Every day | SUBCUTANEOUS | Status: DC

## 2014-02-25 MED ORDER — LISINOPRIL 10 MG PO TABS: 10.0000 mg | ORAL_TABLET | Freq: Every day | ORAL | Status: DC

## 2014-02-25 MED ORDER — LEVOTHYROXINE SODIUM 100 MCG PO TABS: 100.0000 ug | ORAL_TABLET | Freq: Every day | ORAL | Status: DC

## 2014-02-25 MED ORDER — CITALOPRAM HYDROBROMIDE 20 MG PO TABS: 20.0000 mg | ORAL_TABLET | Freq: Every day | ORAL | Status: DC

## 2014-02-25 MED ORDER — PRAVASTATIN SODIUM 20 MG PO TABS: 20.0000 mg | ORAL_TABLET | Freq: Every evening | ORAL | Status: DC
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
## 2014-02-25 NOTE — Assessment & Plan Note (Signed)
Social work consult today for housing and other resources
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
## 2014-02-25 NOTE — Assessment & Plan Note (Signed)
Has not been taking pravastatin. Check lipid panel today. Will resume pravastatin.

## 2014-02-25 NOTE — Assessment & Plan Note (Signed)
Check TSH today
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
## 2014-02-25 NOTE — Assessment & Plan Note (Signed)
Likely secondary to poorly controlled diabetes.  Check Creatinine, Foot exam and refer for Ophth today.
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
## 2014-02-25 NOTE — Assessment & Plan Note (Addendum)
Lab Results  Component Value Date   HGBA1C 10.4 02/25/2014   HGBA1C 10.7* 09/08/2013   HGBA1C 10.5 04/22/2013     Assessment: Diabetes control: poor control (HgbA1C >9%) Progress toward A1C goal:  unchanged Comments: reports that she hadnt taken Lantus today, concern for compliance  Plan: Medications:  continue current medications Home glucose monitoring: Frequency: 3 times a day Timing: before meals Instruction/counseling given: reminded to get eye exam and discussed foot care Educational resources provided: brochure Self management tools provided:   Other plans: pt gave herself Lantus 25 untis in clinic this morning, if no DKA will increase to Lantus 24 units given continued HgbA1c >10 -foot exam today -Eye doc referral back to Dr. Sabra Heck  ADDENDUM: stat bmet with anion gap 13, no DKA likely

## 2014-02-25 NOTE — Assessment & Plan Note (Signed)
BP Readings from Last 3 Encounters:  02/25/14 153/107  09/20/13 159/102  09/19/13 143/98    Lab Results  Component Value Date   NA 138 09/20/2013   K 3.5 09/20/2013   CREATININE 1.40* 09/20/2013    Assessment: Blood pressure control: moderately elevated Progress toward BP goal:  deteriorated Comments: hasnt taken bp med in sometime  Plan: Medications:  continue current medications Educational resources provided: brochure Self management tools provided: home blood pressure logbook Other plans: resume lisinopril 10 mg qd

## 2014-02-26 ENCOUNTER — Telehealth: Payer: Self-pay | Admitting: *Deleted

## 2014-02-26 NOTE — Progress Notes (Signed)
Case discussed with Dr. Michail Sermon soon after the resident saw the patient.  We reviewed the resident's history and exam and pertinent patient test results.  I agree with the assessment, diagnosis and plan of care documented in the resident's note.

## 2014-02-26 NOTE — Telephone Encounter (Signed)
Call to pt to ask about going to her Eye doctor without the [facility].  Message left on pt's voicemail to call the Clinics and to ask for [facility].  Regino Schultze Herbin,RN 02/26/2014 1:33 PM

## 2014-04-04 ENCOUNTER — Ambulatory Visit: Payer: Self-pay

## 2014-07-17 ENCOUNTER — Ambulatory Visit: Payer: Self-pay | Admitting: Internal Medicine

## 2014-07-31 ENCOUNTER — Telehealth: Payer: Self-pay | Admitting: *Deleted

## 2014-07-31 NOTE — Telephone Encounter (Signed)
Encounter opened in error during chart audit and referral follow up. Yvonna Alanis, RN, 07/31/14, 9: 02 PM

## 2014-08-04 ENCOUNTER — Inpatient Hospital Stay (HOSPITAL_COMMUNITY)
Admission: EM | Admit: 2014-08-04 | Discharge: 2014-08-06 | DRG: 641 | Disposition: A | Discharge status: Home / Self Care | Payer: Self-pay | Attending: Internal Medicine | Admitting: Internal Medicine

## 2014-08-04 ENCOUNTER — Inpatient Hospital Stay (HOSPITAL_COMMUNITY): Payer: Self-pay

## 2014-08-04 ENCOUNTER — Encounter (HOSPITAL_COMMUNITY): Payer: Self-pay | Admitting: Emergency Medicine

## 2014-08-04 DIAGNOSIS — E890 Postprocedural hypothyroidism: Secondary | ICD-10-CM | POA: Diagnosis present

## 2014-08-04 DIAGNOSIS — I798 Other disorders of arteries, arterioles and capillaries in diseases classified elsewhere: Secondary | ICD-10-CM | POA: Diagnosis present

## 2014-08-04 DIAGNOSIS — E875 Hyperkalemia: Principal | ICD-10-CM

## 2014-08-04 DIAGNOSIS — N1831 Chronic kidney disease, stage 3a: Secondary | ICD-10-CM

## 2014-08-04 DIAGNOSIS — Z794 Long term (current) use of insulin: Secondary | ICD-10-CM

## 2014-08-04 DIAGNOSIS — E1043 Type 1 diabetes mellitus with diabetic autonomic (poly)neuropathy: Secondary | ICD-10-CM | POA: Diagnosis present

## 2014-08-04 DIAGNOSIS — N183 Chronic kidney disease, stage 3 unspecified: Secondary | ICD-10-CM

## 2014-08-04 DIAGNOSIS — N179 Acute kidney failure, unspecified: Secondary | ICD-10-CM

## 2014-08-04 DIAGNOSIS — E1029 Type 1 diabetes mellitus with other diabetic kidney complication: Secondary | ICD-10-CM | POA: Diagnosis present

## 2014-08-04 DIAGNOSIS — I4949 Other premature depolarization: Secondary | ICD-10-CM | POA: Diagnosis present

## 2014-08-04 DIAGNOSIS — Z9119 Patient's noncompliance with other medical treatment and regimen: Secondary | ICD-10-CM

## 2014-08-04 DIAGNOSIS — N039 Chronic nephritic syndrome with unspecified morphologic changes: Secondary | ICD-10-CM

## 2014-08-04 DIAGNOSIS — R079 Chest pain, unspecified: Secondary | ICD-10-CM

## 2014-08-04 DIAGNOSIS — I498 Other specified cardiac arrhythmias: Secondary | ICD-10-CM | POA: Diagnosis present

## 2014-08-04 DIAGNOSIS — E1159 Type 2 diabetes mellitus with other circulatory complications: Secondary | ICD-10-CM | POA: Diagnosis present

## 2014-08-04 DIAGNOSIS — D509 Iron deficiency anemia, unspecified: Secondary | ICD-10-CM | POA: Diagnosis present

## 2014-08-04 DIAGNOSIS — E785 Hyperlipidemia, unspecified: Secondary | ICD-10-CM | POA: Diagnosis present

## 2014-08-04 DIAGNOSIS — E860 Dehydration: Secondary | ICD-10-CM | POA: Diagnosis present

## 2014-08-04 DIAGNOSIS — N189 Chronic kidney disease, unspecified: Secondary | ICD-10-CM

## 2014-08-04 DIAGNOSIS — J45909 Unspecified asthma, uncomplicated: Secondary | ICD-10-CM | POA: Diagnosis present

## 2014-08-04 DIAGNOSIS — Z91199 Patient's noncompliance with other medical treatment and regimen due to unspecified reason: Secondary | ICD-10-CM

## 2014-08-04 DIAGNOSIS — R109 Unspecified abdominal pain: Secondary | ICD-10-CM

## 2014-08-04 DIAGNOSIS — F1210 Cannabis abuse, uncomplicated: Secondary | ICD-10-CM | POA: Diagnosis present

## 2014-08-04 DIAGNOSIS — E10649 Type 1 diabetes mellitus with hypoglycemia without coma: Secondary | ICD-10-CM | POA: Diagnosis present

## 2014-08-04 DIAGNOSIS — R0602 Shortness of breath: Secondary | ICD-10-CM

## 2014-08-04 DIAGNOSIS — G4700 Insomnia, unspecified: Secondary | ICD-10-CM | POA: Diagnosis present

## 2014-08-04 DIAGNOSIS — E1059 Type 1 diabetes mellitus with other circulatory complications: Secondary | ICD-10-CM | POA: Diagnosis present

## 2014-08-04 DIAGNOSIS — E039 Hypothyroidism, unspecified: Secondary | ICD-10-CM | POA: Diagnosis present

## 2014-08-04 DIAGNOSIS — K219 Gastro-esophageal reflux disease without esophagitis: Secondary | ICD-10-CM | POA: Diagnosis present

## 2014-08-04 DIAGNOSIS — I129 Hypertensive chronic kidney disease with stage 1 through stage 4 chronic kidney disease, or unspecified chronic kidney disease: Secondary | ICD-10-CM | POA: Diagnosis present

## 2014-08-04 DIAGNOSIS — E162 Hypoglycemia, unspecified: Secondary | ICD-10-CM

## 2014-08-04 DIAGNOSIS — IMO0002 Reserved for concepts with insufficient information to code with codable children: Secondary | ICD-10-CM

## 2014-08-04 DIAGNOSIS — I10 Essential (primary) hypertension: Secondary | ICD-10-CM | POA: Diagnosis present

## 2014-08-04 DIAGNOSIS — D649 Anemia, unspecified: Secondary | ICD-10-CM

## 2014-08-04 DIAGNOSIS — R112 Nausea with vomiting, unspecified: Secondary | ICD-10-CM

## 2014-08-04 DIAGNOSIS — Z87891 Personal history of nicotine dependence: Secondary | ICD-10-CM

## 2014-08-04 DIAGNOSIS — E1142 Type 2 diabetes mellitus with diabetic polyneuropathy: Secondary | ICD-10-CM | POA: Diagnosis present

## 2014-08-04 DIAGNOSIS — E1069 Type 1 diabetes mellitus with other specified complication: Secondary | ICD-10-CM

## 2014-08-04 DIAGNOSIS — D631 Anemia in chronic kidney disease: Secondary | ICD-10-CM | POA: Diagnosis present

## 2014-08-04 DIAGNOSIS — E1049 Type 1 diabetes mellitus with other diabetic neurological complication: Secondary | ICD-10-CM | POA: Diagnosis present

## 2014-08-04 DIAGNOSIS — E1065 Type 1 diabetes mellitus with hyperglycemia: Secondary | ICD-10-CM

## 2014-08-04 DIAGNOSIS — F329 Major depressive disorder, single episode, unspecified: Secondary | ICD-10-CM | POA: Diagnosis present

## 2014-08-04 DIAGNOSIS — K3184 Gastroparesis: Secondary | ICD-10-CM | POA: Diagnosis present

## 2014-08-04 LAB — I-STAT CHEM 8, ED
BUN: 24 mg/dL — ABNORMAL HIGH (ref 6–23)
CHLORIDE: 103 meq/L (ref 96–112)
Calcium, Ion: 1.29 mmol/L — ABNORMAL HIGH (ref 1.12–1.23)
Creatinine, Ser: 2 mg/dL — ABNORMAL HIGH (ref 0.50–1.10)
Glucose, Bld: 89 mg/dL (ref 70–99)
HEMATOCRIT: 33 % — AB (ref 36.0–46.0)
Hemoglobin: 11.2 g/dL — ABNORMAL LOW (ref 12.0–15.0)
POTASSIUM: 6.9 meq/L — AB (ref 3.7–5.3)
SODIUM: 132 meq/L — AB (ref 137–147)
TCO2: 24 mmol/L (ref 0–100)

## 2014-08-04 LAB — I-STAT CG4 LACTIC ACID, ED: Lactic Acid, Venous: 1.02 mmol/L (ref 0.5–2.2)

## 2014-08-04 LAB — CBC
HCT: 31.7 % — ABNORMAL LOW (ref 36.0–46.0)
Hemoglobin: 10.2 g/dL — ABNORMAL LOW (ref 12.0–15.0)
MCH: 27.9 pg (ref 26.0–34.0)
MCHC: 32.2 g/dL (ref 30.0–36.0)
MCV: 86.6 fL (ref 78.0–100.0)
Platelets: 183 10*3/uL (ref 150–400)
RBC: 3.66 MIL/uL — ABNORMAL LOW (ref 3.87–5.11)
RDW: 13.1 % (ref 11.5–15.5)
WBC: 5.3 10*3/uL (ref 4.0–10.5)

## 2014-08-04 LAB — TROPONIN I: Troponin I: <0.3 ng/mL (ref <0.30)

## 2014-08-04 LAB — COMPREHENSIVE METABOLIC PANEL
ALT: 13 U/L (ref 0–35)
AST: 23 U/L (ref 0–37)
Albumin: 3.6 g/dL (ref 3.5–5.2)
Alkaline Phosphatase: 92 U/L (ref 39–117)
Anion gap: 11 (ref 5–15)
BUN: 22 mg/dL (ref 6–23)
CO2: 25 mEq/L (ref 19–32)
Calcium: 10.1 mg/dL (ref 8.4–10.5)
Chloride: 98 mEq/L (ref 96–112)
Creatinine, Ser: 1.77 mg/dL — ABNORMAL HIGH (ref 0.50–1.10)
GFR calc Af Amer: 44 mL/min — ABNORMAL LOW (ref >90)
GFR calc non Af Amer: 38 mL/min — ABNORMAL LOW (ref >90)
Glucose, Bld: 85 mg/dL (ref 70–99)
Potassium: 7.1 mEq/L (ref 3.7–5.3)
Sodium: 134 mEq/L — ABNORMAL LOW (ref 137–147)
Total Bilirubin: 0.3 mg/dL (ref 0.3–1.2)
Total Protein: 7.2 g/dL (ref 6.0–8.3)

## 2014-08-04 LAB — URINALYSIS, ROUTINE W REFLEX MICROSCOPIC
Bilirubin Urine: NEGATIVE
Glucose, UA: NEGATIVE mg/dL
Hgb urine dipstick: NEGATIVE
Ketones, ur: NEGATIVE mg/dL
Leukocytes, UA: NEGATIVE
Nitrite: NEGATIVE
Protein, ur: 30 mg/dL — AB
SPECIFIC GRAVITY, URINE: 1.01 (ref 1.005–1.030)
Urobilinogen, UA: 0.2 mg/dL (ref 0.0–1.0)
pH: 7.5 (ref 5.0–8.0)

## 2014-08-04 LAB — GLUCOSE, CAPILLARY: Glucose-Capillary: 545 mg/dL — ABNORMAL HIGH (ref 70–99)

## 2014-08-04 LAB — URINE MICROSCOPIC-ADD ON

## 2014-08-04 LAB — POC URINE PREG, ED: Preg Test, Ur: NEGATIVE

## 2014-08-04 LAB — CBG MONITORING, ED
GLUCOSE-CAPILLARY: 51 mg/dL — AB (ref 70–99)
GLUCOSE-CAPILLARY: 75 mg/dL (ref 70–99)

## 2014-08-04 MED ORDER — INSULIN ASPART 100 UNIT/ML ~~LOC~~ SOLN: 8.0000 [IU] | Freq: Once | SUBCUTANEOUS | Status: DC

## 2014-08-04 MED ORDER — METOCLOPRAMIDE HCL 5 MG PO TABS
5.0000 mg | ORAL_TABLET | Freq: Three times a day (TID) | ORAL | Status: DC
  Administered 2014-08-05 – 2014-08-06 (×5): 5 mg via ORAL
  Filled 2014-08-04 (×7): qty 1

## 2014-08-04 MED ORDER — ALBUTEROL SULFATE (2.5 MG/3ML) 0.083% IN NEBU
5.0000 mg | INHALATION_SOLUTION | Freq: Once | RESPIRATORY_TRACT | Status: AC
  Administered 2014-08-04: 5 mg via RESPIRATORY_TRACT
  Filled 2014-08-04: qty 6

## 2014-08-04 MED ORDER — SODIUM CHLORIDE 0.9 % IV SOLN
INTRAVENOUS | Status: AC
  Administered 2014-08-05 (×3): via INTRAVENOUS

## 2014-08-04 MED ORDER — ONDANSETRON HCL 4 MG PO TABS: 4.0000 mg | ORAL_TABLET | Freq: Four times a day (QID) | ORAL | Status: DC | PRN

## 2014-08-04 MED ORDER — SODIUM BICARBONATE 8.4 % IV SOLN
50.0000 meq | Freq: Once | INTRAVENOUS | Status: AC
  Administered 2014-08-04: 50 meq via INTRAVENOUS
  Filled 2014-08-04: qty 50

## 2014-08-04 MED ORDER — SODIUM CHLORIDE 0.9 % IV SOLN
1.0000 g | Freq: Once | INTRAVENOUS | Status: AC
  Administered 2014-08-04: 1 g via INTRAVENOUS
  Filled 2014-08-04: qty 10

## 2014-08-04 MED ORDER — ONDANSETRON HCL 4 MG/2ML IJ SOLN: 4.0000 mg | Freq: Four times a day (QID) | INTRAMUSCULAR | Status: DC | PRN

## 2014-08-04 MED ORDER — ACETAMINOPHEN 650 MG RE SUPP: 650.0000 mg | Freq: Four times a day (QID) | RECTAL | Status: DC | PRN

## 2014-08-04 MED ORDER — DEXTROSE 50 % IV SOLN
50.0000 mL | Freq: Once | INTRAVENOUS | Status: AC
Start: 2014-08-04 — End: 2014-08-04
  Administered 2014-08-04: 50 mL via INTRAVENOUS
  Filled 2014-08-04: qty 50

## 2014-08-04 MED ORDER — INSULIN ASPART 100 UNIT/ML ~~LOC~~ SOLN
0.0000 [IU] | Freq: Three times a day (TID) | SUBCUTANEOUS | Status: DC
  Administered 2014-08-04 (×2): 9 [IU] via SUBCUTANEOUS

## 2014-08-04 MED ORDER — HEPARIN SODIUM (PORCINE) 5000 UNIT/ML IJ SOLN
5000.0000 [IU] | Freq: Three times a day (TID) | INTRAMUSCULAR | Status: DC
  Administered 2014-08-04 – 2014-08-05 (×4): 5000 [IU] via SUBCUTANEOUS
  Filled 2014-08-04 (×7): qty 1

## 2014-08-04 MED ORDER — SODIUM CHLORIDE 0.9 % IV BOLUS (SEPSIS)
1000.0000 mL | Freq: Once | INTRAVENOUS | Status: AC
  Administered 2014-08-04: 1000 mL via INTRAVENOUS

## 2014-08-04 MED ORDER — SIMVASTATIN 20 MG PO TABS
20.0000 mg | ORAL_TABLET | Freq: Every day | ORAL | Status: DC
  Administered 2014-08-05: 20 mg via ORAL
  Filled 2014-08-04 (×2): qty 1

## 2014-08-04 MED ORDER — ONDANSETRON HCL 4 MG/2ML IJ SOLN
4.0000 mg | Freq: Once | INTRAMUSCULAR | Status: AC
  Administered 2014-08-04: 4 mg via INTRAVENOUS
  Filled 2014-08-04: qty 2

## 2014-08-04 MED ORDER — LEVOTHYROXINE SODIUM 100 MCG PO TABS
100.0000 ug | ORAL_TABLET | Freq: Every day | ORAL | Status: DC
  Administered 2014-08-05 – 2014-08-06 (×2): 100 ug via ORAL
  Filled 2014-08-04 (×3): qty 1

## 2014-08-04 MED ORDER — INSULIN ASPART 100 UNIT/ML ~~LOC~~ SOLN
5.0000 [IU] | Freq: Once | SUBCUTANEOUS | Status: AC
  Administered 2014-08-04: 5 [IU] via INTRAVENOUS
  Filled 2014-08-04: qty 1

## 2014-08-04 MED ORDER — DEXTROSE 5 % IV BOLUS
1000.0000 mL | Freq: Once | INTRAVENOUS | Status: AC
  Administered 2014-08-04: 1000 mL via INTRAVENOUS

## 2014-08-04 MED ORDER — ACETAMINOPHEN 325 MG PO TABS: 650.0000 mg | ORAL_TABLET | Freq: Four times a day (QID) | ORAL | Status: DC | PRN

## 2014-08-04 NOTE — Progress Notes (Signed)
ED CM consulted by E. Hilda Blades concerning patient diabetes management. Pt presented to [facility] ED with hypoglycemia. ED eval CBG-51, K+ 6.9, Creat 2.0 BUN -24, CBC hgb-10.7, hct 31.7, Patient recieved 5% dextrose bolus, Ca Gluconate gtt. in ED.  Patient has been a diabetic since age 28  on insulin. Patient reports not being able to afford to test CBG consistently.  Patient also states, she has not been taking insulin as prescribed. Patient does not have insurance and  has not followed up with a PCP.  Met with patient at bedside confirmed information. Discussed the importance of having a PCP for f/u care and management of chronic conditions, patient verbalized understanding and was in agreement. Offered to assist patient with finding an affordable PCP.  Patient was agreeable with plan.  ED evaluation still in progress. CM will continue to follow.

## 2014-08-04 NOTE — ED Notes (Signed)
Pt took 10 units of insulin then went to sleep, then when pt woke up never ate breakfast. Angela [name] out with mother and friend then suddenly felt sweaty and weak, pt's mother gave her OJ and brought her straight to the ED. Pt given crackers and soda in triage. Pt is diaphoretic. CBG 51.

## 2014-08-04 NOTE — ED Notes (Signed)
EKG given to Dr. Aline Brochure from Dr. Christy Gentles.

## 2014-08-04 NOTE — ED Notes (Signed)
Pt returned from bathroom. Pt monitored by pulse ox, 12-lead, and bp cuff.

## 2014-08-04 NOTE — Progress Notes (Signed)
Pt. Admitted from ED, placed in bed and telemetry box 2 placed.  Oriented to room and informed how to call RN & NT via phone and call bell.  No c/o of pain.  Will continue to monitor.  Alphonzo Lemmings, RN

## 2014-08-04 NOTE — Progress Notes (Signed)
Pt CBG 545. On-call provider notified.

## 2014-08-04 NOTE — ED Notes (Signed)
Case management at the bedside

## 2014-08-04 NOTE — ED Notes (Signed)
Junie Panning, [place] notified of abnormal lab test results

## 2014-08-04 NOTE — Progress Notes (Signed)
Patient reports chest pain 7/10. States she feels "like something is sitting on my chest". On-call provider notified, 12-lead EKG performed. Unconfirmed result shows sinus tachycardia, otherwise unremarkable. Will continue to monitor.

## 2014-08-04 NOTE — ED Notes (Signed)
No s/s of hypo/hyperglycemia. Pt. Is in NAD.  Dinner tray ordered.

## 2014-08-04 NOTE — ED Notes (Signed)
PT ambulated to bathroom. Pt's mother accompanying pt. Steady gait. Pt refused safety socks.

## 2014-08-04 NOTE — Progress Notes (Signed)
Called ED to get report.  Report given by Cecille Rubin, RN.  Will await for pt. To arrive to floor.  Alphonzo Lemmings, RN

## 2014-08-04 NOTE — Progress Notes (Addendum)
Spoke with on-call provider. Provider instructed RN to give 9 units of Novolog insulin now. Insulin given, will continue to monitor.

## 2014-08-04 NOTE — Progress Notes (Signed)
Patient's CBG rechecked following administration of insulin at 2250. CBG 505. On-call provider notified. Instructed RN to recheck CBG after bolus is complete. Bolus currently running.

## 2014-08-04 NOTE — ED Notes (Signed)
Dinner tray at the bedside.  Pt. Ate approximately 25%

## 2014-08-04 NOTE — ED Notes (Signed)
Notified Greggory Stallion PA of Potassium 7.1

## 2014-08-04 NOTE — ED Provider Notes (Signed)
CSN: [id number]     Arrival date & time 08/04/14  1456 History   First MD Initiated Contact with Patient 08/04/14 1541     Chief Complaint  Patient presents with  . Hypoglycemia     (Consider location/radiation/quality/duration/timing/severity/associated sxs/prior Treatment) HPI Pt is a 28yo female with hx of major depression, anemia, insomnia, asthma, gastritis, HTN, DM type 1 with medication non-compliance, GERD, diabetic neuropathy, and hypothyroidism presenting to ED with c/o hypoglycemia.  Mother and friend are with pt and provided most of story as pt states she feels very weak.  Per mother, pt's sugar fluctuates greatly and she has no means of measuring her sugar so she goes on based how she feels rather than her actual glucose level. Pt took 10 units of insulin last night, woke this morning not feeling well, had some orange juice then went out shopping. While walking, pt suddenly became weak, shaky, and sweaty.  Pt had not eaten a meal PTA, pt was given crackers, a sandwhich and soda in the waiting room and CBG has increased from 51 to 75.  Pt mother, pt is "always sick"  Pt is always nauseated so she does not like to eat as she is fearful she will become sick.  Pt is seen by Dr. Naaman Plummer but does not have a follow up appointment until at least next week.    Past Medical History  Diagnosis Date  . HLD (hyperlipidemia)   . Depression, major     was on multiple medication before followed by psych but was lost to follow up 2-3 years ago when she go arrested, stopped multiple medications that she was on (zoloft, abilify, depakote) , never restarted it  . Anemia     baseline Hb 10-11, ferriting 53  . 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 Asthma   . Abnormal Pap smear of cervix     ascus noted 2007  . Gastritis   . Hypertension   . DM type 1 (diabetes mellitus, type 1)     uncontrolled due to medication non compliance, DKA admission at [facility] in 2008, Dx age 29   . GERD  (gastroesophageal reflux disease)   . Neuromuscular disorder     DIABETIC NEUROPATHY   . Hypothyroidism     untreated, non compliance   Past Surgical History  Procedure Laterality Date  . Foot fusion Right 2006    "put screws in it too" (09/19/2013)   Family History  Problem Relation Age of Onset  . Multiple sclerosis Mother   . Hypothyroidism Mother   . Hyperlipidemia Maternal Grandmother   . Hypertension Maternal Grandfather   . Hypertension Maternal Grandmother   . Prostate cancer Maternal Grandfather   . Diabetes type I Maternal Grandfather   . Breast cancer Paternal Grandmother   . Heart disease Maternal Grandmother     unknown type  . Stroke Mother     at age 34 yo   History  Substance Use Topics  . Smoking status: Former Smoker -- 0.25 packs/day for 2 years    Types: Cigarettes    Quit date: 03/04/2013  . Smokeless tobacco: Never Used  . Alcohol Use: No   OB History   Grav Para Term Preterm Abortions TAB SAB Ect Mult Living   0 0 0 0 0 0 0 0 0 0      Review of Systems  Constitutional: Positive for diaphoresis, appetite change and fatigue ( chronic). Negative for fever, chills and unexpected weight change.  Respiratory: Negative for cough and shortness of breath.   Cardiovascular: Negative for chest pain and palpitations.  Gastrointestinal: Positive for nausea and abdominal pain ( mild, diffuse). Negative for vomiting, diarrhea and constipation.  Skin: Negative for color change and wound.  Neurological: Positive for tremors, syncope ( near) and weakness. Negative for seizures.  All other systems reviewed and are negative.     Allergies  Ciprofloxacin; Sulfamethoxazole; and Trimethoprim  Home Medications   Prior to Admission medications   Medication Sig Start Date End Date Taking? Authorizing Provider  albuterol (PROVENTIL HFA;VENTOLIN HFA) 108 (90 BASE) MCG/ACT inhaler Inhale 1-2 puffs into the lungs every 6 (six) hours as needed for wheezing or shortness  of breath.   Yes Historical Provider, MD  BIOTIN PO Take 1 tablet by mouth daily.   Yes Historical Provider, MD  fexofenadine (ALLEGRA) 180 MG tablet Take 180 mg by mouth daily as needed for allergies or rhinitis.   Yes Historical Provider, MD  insulin aspart (NOVOLOG) 100 UNIT/ML injection Inject 1-5 Units into the skin 3 (three) times daily with meals. Give 1 unit per 15 grams of carbohydrates with goal CBG 150 09/20/13  Yes Valaria Good, MD  insulin glargine (LANTUS) 100 UNIT/ML injection Inject 24 Units into the skin at bedtime.   Yes Historical Provider, MD  levothyroxine (SYNTHROID, LEVOTHROID) 100 MCG tablet Take 100 mcg by mouth daily before breakfast.   Yes Historical Provider, MD  lisinopril (PRINIVIL,ZESTRIL) 10 MG tablet Take 10 mg by mouth daily.   Yes Historical Provider, MD  pravastatin (PRAVACHOL) 20 MG tablet Take 20 mg by mouth every evening.   Yes Historical Provider, MD   BP 139/94  Pulse 124  Temp(Src) 98 F (36.7 C) (Oral)  Resp 20  Ht 5\' 5"  (1.651 m)  Wt 147 lb 1.6 oz (66.724 kg)  BMI 24.48 kg/m2  SpO2 100%  LMP 07/04/2014 Physical Exam  Nursing note and vitals reviewed. Constitutional: She is oriented to person, place, and time.  Pt is a thin female lying in exam bed curled up in sheets and blanket. NAD. Appears mildly fatigued.   HENT:  Head: Normocephalic and atraumatic.  Eyes: Conjunctivae are normal. No scleral icterus.  Neck: Normal range of motion.  Cardiovascular: Normal rate, regular rhythm and normal heart sounds.   Regular rate and rhythm  Pulmonary/Chest: Effort normal and breath sounds normal. No respiratory distress. She has no wheezes. She has no rales. She exhibits no tenderness.  No respiratory distress, able to speak in full sentences w/o difficulty. Lungs:CTAB  Abdominal: Soft. Bowel sounds are normal. She exhibits no distension and no mass. There is tenderness. There is no rebound and no guarding.  Soft, non-distended, mild diffuse  tenderness  Musculoskeletal: Normal range of motion.  Neurological: She is alert and oriented to person, place, and time.  Skin: Skin is warm and dry.    ED Course  Procedures   CRITICAL CARE Performed by: Noland Fordyce A.   Total critical care time: 30 minutes  Critical care time was exclusive of separately billable procedures and treating other patients.  Critical care was necessary to treat or prevent imminent or life-threatening deterioration.  Critical care was time spent personally by me on the following activities: development of treatment plan with patient and/or surrogate as well as nursing, discussions with consultants, evaluation of patient's response to treatment, examination of patient, obtaining history from patient or surrogate, ordering and performing treatments and interventions, ordering and review of laboratory studies, ordering and  review of radiographic studies, pulse oximetry and re-evaluation of patient's condition.   Labs Review Labs Reviewed  CBC - Abnormal; Notable for the following:    RBC 3.66 (*)    Hemoglobin 10.2 (*)    HCT 31.7 (*)    All other components within normal limits  COMPREHENSIVE METABOLIC PANEL - Abnormal; Notable for the following:    Sodium 134 (*)    Potassium 7.1 (*)    Creatinine, Ser 1.77 (*)    GFR calc non Af Amer 38 (*)    GFR calc Af Amer 44 (*)    All other components within normal limits  URINALYSIS, ROUTINE W REFLEX MICROSCOPIC - Abnormal; Notable for the following:    Protein, ur 30 (*)    All other components within normal limits  URINE MICROSCOPIC-ADD ON - Abnormal; Notable for the following:    Squamous Epithelial / LPF FEW (*)    All other components within normal limits  CBG MONITORING, ED - Abnormal; Notable for the following:    Glucose-Capillary 51 (*)    All other components within normal limits  I-STAT CHEM 8, ED - Abnormal; Notable for the following:    Sodium 132 (*)    Potassium 6.9 (*)    BUN 24  (*)    Creatinine, Ser 2.00 (*)    Calcium, Ion 1.29 (*)    Hemoglobin 11.2 (*)    HCT 33.0 (*)    All other components within normal limits  TSH  HEMOGLOBIN A1C  CBC  BASIC METABOLIC PANEL  CBG MONITORING, ED  POC URINE PREG, ED  I-STAT CG4 LACTIC ACID, ED    Imaging Review No results found.   EKG Interpretation   Date/Time:  Monday August 04 2014 17:04:05 EDT Ventricular Rate:  89 PR Interval:  176 QRS Duration: 90 QT Interval:  367 QTC Calculation: 446 R Axis:   -6 Text Interpretation:  Sinus tachycardia Ventricular premature complex ST  elev, probable normal early repol pattern Baseline wander in lead(s) I aVL  V2 V4 t waves more prominent on todays ecg Confirmed by HARRISON  MD,  FORREST ([id number]) on 08/04/2014 5:20:13 PM      MDM   Final diagnoses:  Hyperkalemia  Hypoglycemia    Pt is a 28yo female with hx of Type 1 DM, medication non-compliance, presenting to ED with c/o hypoglycemia, feeling weak, shaky, diaphoretic.  Pt is alert and oriented, not diaphoretic during exam, no respiratory distress. Vitals: WNL. Lungs: CTAB, Heart: regular rate and rhythm, abd: soft, mild defuse tenderness, neuro: no focal neuro deficit.   Labs: significant for hyperkalemia K: 7.1, Cr: 1.77 which is slight elevation from 69mo ago at 1.6.  Glucose on CMP was 85 after pt was given soda, and sandwich.  Discussed hyperkalemia with Dr. Aline Brochure who also examined pt, pt started on D50 IV solution, insulin, albuterol, sodium bicarb, and calcium gluconate.   Pt discussed with case management who also spoke with pt and helped establish PCP.  Plan is to ensure pt has close outpatient f/u once discharged home as pt needs assistance with medication and supplies.   6:27 PM Consulted with internal medicine, pt will be admitted under the care of Dr. Ellwood Dense, pt assigned to a telemetry bed. Pt is stable at this time, still A&Ox3       Noland Fordyce, PA-C 08/04/14 2313

## 2014-08-04 NOTE — H&P (Signed)
Date: 08/04/2014               Patient Name:  Angela [name] MRN: [id number]  DOB: 25-Dec-1985 Age / Sex: 28 y.o., female   PCP: No Pcp Per Patient         Medical Service: Internal Medicine Teaching Service         Attending Physician: Dr. Madilyn Fireman, MD    First Contact: Nadine Counts, MSIV Pager: [phone]  Second Contact: Dr. Alice Rieger Pager: [phone]       After Hours (After 5p/  First Contact Pager: [phone]  weekends / holidays): Second Contact Pager: [phone]   Chief Complaint: weakness  History of Present Illness: Ms. Koester is a 28 year old woman with history of DM type 1 with medication noncompliance, hypothyroidism, HTN, GERD, anemia, depression presenting with weakness and hypoglycemia. Last night she took 10u of novolog. Woke up this morning and had some orange juice prior to going shopping with her mother. She suddenly became weak, diaphoretic and shaky. She fell asleep in the car. Her mother tried to give her some orange juice on the way to the ED. She last was hospitalized for hypoglycemia 09/08/2013 which improved with 3 amps of D50.  She has been having a poor appetite, which she tries to stimulate with daily marijuana use. She has bilious, nonbloody emesis in the mornings for the past few months. She has not been taking her Reglan. She takes insulin when she does eat - 10u of Novolog, which she gets insulin pens from friends. She rarely takes her Lantus. She says her BG greater than 500. Per patient's mother, she does not have a reliable method of measuring blood sugar at home. Reports fatigue, chills for the past week, chest pain with deep breathing, SOB with exertion, occasional epigastric pain, LH/dizziness with moving. She has lost 15 lbs intentionally - exercising every morning and evening. She had diarrhea 1 week ago that has resolved spontaneously. Denies fevers or dysuria. Her roommate, niece, and nephew have been having cold symptoms.  In the ED her BG was 51. It  increased to 75 after eating crackers and a sandwich.   Meds: Albuterol 1-2 puffs Q6hr prn wheezing Citalopram 20mg  daily Novolog 1-6u prn hyperglycemia, 1-5u TID with meals Lantus 24u QHS Synthroid 178mcg daily Lisinopril 10mg  daily Reglan 5mg  TID Pravastatin 20mg  daily  Allergies: Allergies as of 08/04/2014 - Review Complete 08/04/2014  Allergen Reaction Noted  . Ciprofloxacin Hives 03/02/2012  . Sulfamethoxazole Itching 09/14/2006  . Trimethoprim Hives 09/14/2006   Past Medical History  Diagnosis Date  . HLD (hyperlipidemia)   . Depression, major     was on multiple medication before followed by psych but was lost to follow up 2-3 years ago when she go arrested, stopped multiple medications that she was on (zoloft, abilify, depakote) , never restarted it  . Anemia     baseline Hb 10-11, ferriting 53  . 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 Asthma   . Abnormal Pap smear of cervix     ascus noted 2007  . Gastritis   . Hypertension   . DM type 1 (diabetes mellitus, type 1)     uncontrolled due to medication non compliance, DKA admission at [facility] in 2008, Dx age 54   . GERD (gastroesophageal reflux disease)   . Neuromuscular disorder     DIABETIC NEUROPATHY   . Hypothyroidism     untreated, non compliance   Past Surgical History  Procedure Laterality  Date  . Foot fusion Right 2006    "put screws in it too" (09/19/2013)   Family History  Problem Relation Age of Onset  . Multiple sclerosis Mother   . Hypothyroidism Mother   . Hyperlipidemia Maternal Grandmother   . Hypertension Maternal Grandfather   . Hypertension Maternal Grandmother   . Prostate cancer Maternal Grandfather   . Diabetes type I Maternal Grandfather   . Breast cancer Paternal Grandmother   . Heart disease Maternal Grandmother     unknown type  . Stroke Mother     at age 91 yo   History   Social History  . Marital Status: Divorced    Spouse Name: N/A    Number of Children: 0  . Years  of Education: 10th grade   Occupational History  . unemployed     worked at a group   Social History Main Topics  . Smoking status: Former Smoker -- 0.25 packs/day for 2 years    Types: Cigarettes    Quit date: 03/04/2013  . Smokeless tobacco: Never Used  . Alcohol Use: No  . Drug Use: Yes    Special: Marijuana     Comment: 09/19/2013 "last time I used was 2 days ago"  . Sexual Activity: Yes    Birth Control/ Protection: None   Other Topics Concern  . Not on file   Social History Narrative   Occupation: currently unemployed   Single   Homosexual,     Used to be a gang member, got arrested for robbing a gas station (March - June 2012), is cleared now and lives away from her previous friends.    Living with her current partner in [place].  Mother still active in her life.  mother who is an Glass blower/designer in [employer].    Smoking Status:  Current, Packs/Day:  1   Sexual History:  multiple partners in the past, same sex encounters,current partner is a CNA and she is planning to move in with her   Drug Use:  Marijuana, denies cocaine, heroin, or amphetamines.            Review of Systems: Constitutional: no fevers, +chills, +anorexia, +fatigue Eyes: no vision changes Ears, nose, mouth, throat, and face: no cough Respiratory: +shortness of breath with exertion Cardiovascular: +chest pain with deep breathing Gastrointestinal: +nausea/vomiting, +abdominal pain, no constipation, +diarrhea (resolved) Genitourinary: no dysuria, no hematuria Integument: no rash Hematologic/lymphatic: no bleeding/bruising, no edema Musculoskeletal: no arthralgias, no myalgias Neurological: no paresthesias, no weakness  Physical Exam: Blood pressure 139/94, pulse 124, temperature 98 F (36.7 C), temperature source Oral, resp. rate 20, height 5\' 5"  (1.651 m), weight 147 lb 1.6 oz (66.724 kg), last menstrual period 07/04/2014, SpO2 100.00%. General Apperance: NAD Head: Normocephalic,  atraumatic Eyes: PERRL, EOMI, anicteric sclera Ears: Nares normal, septum midline, mucosa normal Throat: Lips, mucosa and tongue normal, dry mucous membranes Neck: Supple, trachea midline Back: No tenderness or bony abnormality  Lungs: Clear to auscultation bilaterally. No wheezes, rhonchi or rales. Breathing comfortably Chest Wall: Nontender, no deformity Heart: Regular rate and rhythm, no murmur/rub/gallop Abdomen: Soft, mild diffuse tenderness, nondistended, no rebound/guarding Extremities: Normal, atraumatic, warm and well perfused, no edema Pulses: 2+ throughout Skin: No rashes or lesions Neurologic: Alert and oriented x 3. CNII-XII intact. Normal strength and sensation  Lab results: Basic Metabolic Panel:  Recent Labs  08/04/14 1622 08/04/14 1629  NA 134* 132*  K 7.1* 6.9*  CL 98 103  CO2 25  --  GLUCOSE 85 89  BUN 22 24*  CREATININE 1.77* 2.00*  CALCIUM 10.1  --    Liver Function Tests:  Recent Labs  08/04/14 1622  AST 23  ALT 13  ALKPHOS 92  BILITOT 0.3  PROT 7.2  ALBUMIN 3.6   CBC:  Recent Labs  08/04/14 1622 08/04/14 1629  WBC 5.3  --   HGB 10.2* 11.2*  HCT 31.7* 33.0*  MCV 86.6  --   PLT 183  --    CBG:  Recent Labs  08/04/14 1501 08/04/14 1539  GLUCAP 51* 75   Urinalysis:  Recent Labs  08/04/14 1639  COLORURINE YELLOW  LABSPEC 1.010  PHURINE 7.5  GLUCOSEU NEGATIVE  HGBUR NEGATIVE  BILIRUBINUR NEGATIVE  KETONESUR NEGATIVE  PROTEINUR 30*  UROBILINOGEN 0.2  NITRITE NEGATIVE  LEUKOCYTESUR NEGATIVE   Misc. Labs: Lactic acid 1.02 Urine pregnancy negative  Other results: EKG: normal EKG, normal sinus rhythm, T waves more prominent. T wave inversion in V2. ST elevation in V3. Otherwise, unchanged from previous tracings.  Assessment & Plan by Problem: Active Problems:   Hyperkalemia  Generalized weakness and hypoglycemia: Known DM type 1 with medication noncompliance. Supposed to be on novolog 1-6u prn hyperglycemia, 1-5u  TID with meals, and Lantus 24u QHS. Last hgb A1c 10.4 02/25/2014. Presentation likely 2/2 poor PO intake and insulin noncompliance. Fatigue and generalized weakness could also be 2/2 known hypothyroidism. Supposed to be on Synthroid 177mcg daily. Last TSH 28.474 02/25/2014. In the ED her BG was 51. It increased to 75 after eating crackers and a sandwich. -Hgb A1c -TSH, free T4 -continue home Synthroid 146mcg -Case management for problems with access to medication -SSI, accuchecks QID  Poor appetite, nausea, vomiting, abdominal pain: Differential includes gastroparesis, GERD, PUD, gastroenteritis. She has not been taking Reglan before meals and has risk factors (DM type 1) for gastroparesis. Unlikely to be infectious in etiology as she does not have fever or elevated WBC. -continue Reglan 5mg  TID -consider H2blocker or PPI if sx do not improve.  Chest pain, SOB with exertion: Differential includes: stable angina, GERD, PUD, MSK, pulmonary. She has a history of cocaine use with positive UDS in the past. -Troponin -Repeat EKG -CXR -UDS -continue statin  Hyperkalemia: Potassium 6.9 upon presentation to ED. Received nebulized albuterol, 1g calcium gluconate, 5u insulin aspart, 23meq sodium bicarb. EKG with more prominent T waves. -Repeat BMP at [facility] -hold lisinopril 10mg  daily  AKI on CKD stage 3: Cr 1.77-2 upon admission. Baseline around 1.4. Likely prerenal 2/2 poor PO intake. -NS@125ml /hr -hold lisinopril 10mg  daily -Recheck BMP in AM  Anemia: Hgb 10.2 - 11.2 upon admission. Baseline around 10.2. -Continue to monitor  FEN: -FLD, advance diet as tolerated -NS@125ml /hr  DVT ppx: subq hep 5000u TID  Dispo: Disposition is deferred at this time, awaiting improvement of current medical problems. Anticipated discharge in approximately 1 day(s).   The patient does have a current PCP (No Pcp Per Patient - Has appt with Rabbani) and does need an [facility] hospital follow-up appointment after  discharge.  The patient does not know have transportation limitations that hinder transportation to clinic appointments.  Signed: Jacques Earthly, MD 08/04/2014, 8:34 PM

## 2014-08-04 NOTE — Progress Notes (Signed)
Spoke with on-call provider regarding CBG of 505. Instructed RN to give another 9 units of Novolog, and another 1000 mL bolus of NS. Will continue to monitor.

## 2014-08-04 NOTE — ED Notes (Addendum)
CGB = 75

## 2014-08-05 DIAGNOSIS — E039 Hypothyroidism, unspecified: Secondary | ICD-10-CM

## 2014-08-05 DIAGNOSIS — D631 Anemia in chronic kidney disease: Secondary | ICD-10-CM | POA: Diagnosis present

## 2014-08-05 DIAGNOSIS — K219 Gastro-esophageal reflux disease without esophagitis: Secondary | ICD-10-CM

## 2014-08-05 DIAGNOSIS — E109 Type 1 diabetes mellitus without complications: Secondary | ICD-10-CM

## 2014-08-05 DIAGNOSIS — E785 Hyperlipidemia, unspecified: Secondary | ICD-10-CM

## 2014-08-05 DIAGNOSIS — I129 Hypertensive chronic kidney disease with stage 1 through stage 4 chronic kidney disease, or unspecified chronic kidney disease: Secondary | ICD-10-CM

## 2014-08-05 DIAGNOSIS — E1043 Type 1 diabetes mellitus with diabetic autonomic (poly)neuropathy: Secondary | ICD-10-CM | POA: Diagnosis present

## 2014-08-05 DIAGNOSIS — N189 Chronic kidney disease, unspecified: Secondary | ICD-10-CM

## 2014-08-05 DIAGNOSIS — K3184 Gastroparesis: Secondary | ICD-10-CM

## 2014-08-05 LAB — BASIC METABOLIC PANEL WITH GFR
Anion gap: 11 (ref 5–15)
Anion gap: 12 (ref 5–15)
BUN: 19 mg/dL (ref 6–23)
BUN: 15 mg/dL (ref 6–23)
CO2: 22 meq/L (ref 19–32)
CO2: 22 meq/L (ref 19–32)
Calcium: 8.5 mg/dL (ref 8.4–10.5)
Calcium: 9 mg/dL (ref 8.4–10.5)
Chloride: 104 meq/L (ref 96–112)
Chloride: 105 meq/L (ref 96–112)
Creatinine, Ser: 1.64 mg/dL — ABNORMAL HIGH (ref 0.50–1.10)
Creatinine, Ser: 1.44 mg/dL — ABNORMAL HIGH (ref 0.50–1.10)
GFR calc Af Amer: 48 mL/min — ABNORMAL LOW
GFR calc Af Amer: 56 mL/min — ABNORMAL LOW
GFR calc non Af Amer: 41 mL/min — ABNORMAL LOW
GFR calc non Af Amer: 48 mL/min — ABNORMAL LOW
Glucose, Bld: 256 mg/dL — ABNORMAL HIGH (ref 70–99)
Glucose, Bld: 141 mg/dL — ABNORMAL HIGH (ref 70–99)
Potassium: 6.3 meq/L — ABNORMAL HIGH (ref 3.7–5.3)
Potassium: 4.8 meq/L (ref 3.7–5.3)
Sodium: 137 meq/L (ref 137–147)
Sodium: 139 meq/L (ref 137–147)

## 2014-08-05 LAB — GLUCOSE, CAPILLARY
GLUCOSE-CAPILLARY: 83 mg/dL (ref 70–99)
GLUCOSE-CAPILLARY: 136 mg/dL — AB (ref 70–99)
GLUCOSE-CAPILLARY: 223 mg/dL — AB (ref 70–99)
GLUCOSE-CAPILLARY: 151 mg/dL — AB (ref 70–99)
Glucose-Capillary: 125 mg/dL — ABNORMAL HIGH (ref 70–99)
Glucose-Capillary: 368 mg/dL — ABNORMAL HIGH (ref 70–99)
Glucose-Capillary: 505 mg/dL — ABNORMAL HIGH (ref 70–99)
Glucose-Capillary: 88 mg/dL (ref 70–99)

## 2014-08-05 LAB — BASIC METABOLIC PANEL
ANION GAP: 11 (ref 5–15)
BUN: 23 mg/dL (ref 6–23)
CO2: 23 meq/L (ref 19–32)
CREATININE: 1.76 mg/dL — AB (ref 0.50–1.10)
Calcium: 8.8 mg/dL (ref 8.4–10.5)
Chloride: 104 mEq/L (ref 96–112)
GFR calc Af Amer: 44 mL/min — ABNORMAL LOW (ref >90)
GFR, EST NON AFRICAN AMERICAN: 38 mL/min — AB (ref >90)
Glucose, Bld: 84 mg/dL (ref 70–99)
Potassium: 6.1 mEq/L — ABNORMAL HIGH (ref 3.7–5.3)
Sodium: 138 mEq/L (ref 137–147)

## 2014-08-05 LAB — RAPID URINE DRUG SCREEN, HOSP PERFORMED
AMPHETAMINES: NOT DETECTED
Barbiturates: NOT DETECTED
Benzodiazepines: NOT DETECTED
Cocaine: NOT DETECTED
OPIATES: NOT DETECTED
Tetrahydrocannabinol: NOT DETECTED

## 2014-08-05 LAB — CBC
HCT: 24.1 % — ABNORMAL LOW (ref 36.0–46.0)
Hemoglobin: 7.8 g/dL — ABNORMAL LOW (ref 12.0–15.0)
MCH: 28.1 pg (ref 26.0–34.0)
MCHC: 32.4 g/dL (ref 30.0–36.0)
MCV: 86.7 fL (ref 78.0–100.0)
PLATELETS: 154 10*3/uL (ref 150–400)
RBC: 2.78 MIL/uL — ABNORMAL LOW (ref 3.87–5.11)
RDW: 13.1 % (ref 11.5–15.5)
WBC: 5.7 10*3/uL (ref 4.0–10.5)

## 2014-08-05 LAB — RETICULOCYTES
RBC.: 2.71 MIL/uL — ABNORMAL LOW (ref 3.87–5.11)
Retic Count, Absolute: 40.7 K/uL (ref 19.0–186.0)
Retic Ct Pct: 1.5 % (ref 0.4–3.1)

## 2014-08-05 LAB — IRON AND TIBC
Iron: 38 ug/dL — ABNORMAL LOW (ref 42–135)
Saturation Ratios: 14 % — ABNORMAL LOW (ref 20–55)
TIBC: 269 ug/dL (ref 250–470)
UIBC: 231 ug/dL (ref 125–400)

## 2014-08-05 LAB — HIV ANTIBODY (ROUTINE TESTING W REFLEX): HIV: NONREACTIVE

## 2014-08-05 LAB — TSH: TSH: 6.51 u[IU]/mL — AB (ref 0.350–4.500)

## 2014-08-05 LAB — FERRITIN: Ferritin: 34 ng/mL (ref 10–291)

## 2014-08-05 LAB — T4, FREE: FREE T4: 0.89 ng/dL (ref 0.80–1.80)

## 2014-08-05 LAB — VITAMIN B12: Vitamin B-12: 241 pg/mL (ref 211–911)

## 2014-08-05 LAB — FOLATE: FOLATE: 10 ng/mL

## 2014-08-05 LAB — HEMOGLOBIN A1C
HEMOGLOBIN A1C: 9.9 % — AB (ref <5.7)
Mean Plasma Glucose: 237 mg/dL — ABNORMAL HIGH (ref <117)

## 2014-08-05 MED ORDER — SODIUM POLYSTYRENE SULFONATE 15 GM/60ML PO SUSP
30.0000 g | Freq: Once | ORAL | Status: AC
  Administered 2014-08-05: 30 g via ORAL
  Filled 2014-08-05: qty 120

## 2014-08-05 MED ORDER — INSULIN ASPART 100 UNIT/ML ~~LOC~~ SOLN: 9.0000 [IU] | Freq: Once | SUBCUTANEOUS | Status: AC

## 2014-08-05 MED ORDER — INSULIN GLARGINE 100 UNIT/ML ~~LOC~~ SOLN
20.0000 [IU] | Freq: Every day | SUBCUTANEOUS | Status: DC
  Filled 2014-08-05: qty 0.2

## 2014-08-05 MED ORDER — INSULIN GLARGINE 100 UNIT/ML ~~LOC~~ SOLN
10.0000 [IU] | Freq: Every day | SUBCUTANEOUS | Status: DC
  Administered 2014-08-06: 10 [IU] via SUBCUTANEOUS
  Filled 2014-08-05: qty 0.1

## 2014-08-05 MED ORDER — INSULIN ASPART 100 UNIT/ML ~~LOC~~ SOLN
0.0000 [IU] | SUBCUTANEOUS | Status: DC
  Administered 2014-08-05: 2 [IU] via SUBCUTANEOUS
  Administered 2014-08-05: 3 [IU] via SUBCUTANEOUS
  Administered 2014-08-05 (×2): 1 [IU] via SUBCUTANEOUS
  Administered 2014-08-06: 5 [IU] via SUBCUTANEOUS
  Administered 2014-08-06: 1 [IU] via SUBCUTANEOUS
  Administered 2014-08-06: 2 [IU] via SUBCUTANEOUS

## 2014-08-05 NOTE — Progress Notes (Signed)
Notified MD of CBG 368. Instructed by MD to recheck CBG in 2 hours. Will continue to monitor.

## 2014-08-05 NOTE — Progress Notes (Signed)
CBG now 83. Patient given snack. Will continue to monitor.

## 2014-08-05 NOTE — ED Provider Notes (Signed)
Medical screening examination/treatment/procedure(s) were conducted as a shared visit with non-physician practitioner(s) and myself.  I personally evaluated the patient during the encounter.   EKG Interpretation   Date/Time:  Monday August 04 2014 17:04:05 EDT Ventricular Rate:  89 PR Interval:  176 QRS Duration: 90 QT Interval:  367 QTC Calculation: 446 R Axis:   -6 Text Interpretation:  Sinus tachycardia Ventricular premature complex ST  elev, probable normal early repol pattern Baseline wander in lead(s) I aVL  V2 V4 t waves more prominent on todays ecg Confirmed by Josmar Messimer  MD,  Aloni Chuang ([id number]) on 08/04/2014 5:20:13 PM      I interviewed and examined the patient. Lungs are CTAB. Cardiac exam wnl. Abdomen soft. Pt not checking her BS's at home, just taking insulin based on how she feels. This is likely the cause of her hypoglycemia. Acute on chronic kidney inj found w/ hyperkalemia as well. Pt treated w/ bicarb, insulin/dextrose, alb neb, and calcium gluconate. T waves are prominent on ecg. Plan for admission to internal medicine teaching (unassigned).   CRITICAL CARE Performed by: Pamella Pert, S Total critical care time: 30 min Critical care time was exclusive of separately billable procedures and treating other patients. Critical care was necessary to treat or prevent imminent or life-threatening deterioration. Critical care was time spent personally by me on the following activities: development of treatment plan with patient and/or surrogate as well as nursing, discussions with consultants, evaluation of patient's response to treatment, examination of patient, obtaining history from patient or surrogate, ordering and performing treatments and interventions, ordering and review of laboratory studies, ordering and review of radiographic studies, pulse oximetry and re-evaluation of patient's condition.   Pamella Pert, MD 08/05/14 1352

## 2014-08-05 NOTE — Progress Notes (Signed)
RN asked patient if she had any suicidal ideations. Patient denies having any at this time but did report attempting suicide a few months ago. She stated that she was not hospitalized following that attempt and denies any thoughts of suicide at this time. RN asked patient to let a staff member know should she start having any suicidal ideations. Patient agreed that she would do so. Will continue to monitor.

## 2014-08-05 NOTE — Progress Notes (Signed)
Utilization review completed.  

## 2014-08-05 NOTE — Progress Notes (Signed)
Subjective: Overall, she is feeling improved. She has not been eating very well, because of gastroparesis. She stated that solid food sits in her stomach, and she is able to vomit food that had eaten three days prior. She has not had continual access to a glucometer for approximately 1 year, and as a result, does not check her blood glucose very often. In addition, she has had issues with obtaining Lantus regularly. She was seen by case management this morning and was set up with an outpatient appointment with [place] on 8/24, where she will be assisted with insulin, glucometer, and other medications. Her appointment for an orange card was also setup by case management at the [facility]. Denies nausea, vomiting, abdominal pain, and diarrhea.   Objective: Vital signs in last 24 hours: Filed Vitals:   08/04/14 1845 08/04/14 1936 08/04/14 2352 08/05/14 0649  BP: 158/97 139/94 146/94 113/72  Pulse: 129 124 120 99  Temp:  98 F (36.7 C) 98.2 F (36.8 C) 98.6 F (37 C)  TempSrc:  Oral Oral Oral  Resp: 18 20 17 14   Height:  5\' 5"  (1.651 m)    Weight:  66.724 kg (147 lb 1.6 oz)    SpO2: 100% 100% 100% 97%   Weight change:  No intake or output data in the 24 hours ending 08/05/14 0959  BP 113/72  Pulse 99  Temp(Src) 98.6 F (37 C) (Oral)  Resp 14  Ht 5\' 5"  (1.651 m)  Wt 66.724 kg (147 lb 1.6 oz)  BMI 24.48 kg/m2  SpO2 97%  LMP 07/04/2014 General appearance: alert, cooperative and no distress Lungs: clear to auscultation bilaterally Heart: regular rate and rhythm, S1, S2 normal, no murmur, click, rub or gallop Abdomen: soft, non-tender; bowel sounds normal; no masses,  no organomegaly Extremities: extremities normal, atraumatic, no cyanosis or edema Pulses: 2+ and symmetric  Lab Results: Basic Metabolic Panel:  Recent Labs Lab 08/04/14 1622 08/04/14 1629 08/05/14 0609  NA 134* 132* 138  K 7.1* 6.9* 6.1*   CL 98 103 104  CO2 25  --  23  GLUCOSE 85 89 84  BUN 22 24* 23  CREATININE 1.77* 2.00* 1.76*  CALCIUM 10.1  --  8.8   Liver Function Tests:  Recent Labs Lab 08/04/14 1622  AST 23  ALT 13  ALKPHOS 92  BILITOT 0.3  PROT 7.2  ALBUMIN 3.6   CBC:  Recent Labs Lab 08/04/14 1622 08/04/14 1629 08/05/14 0609  WBC 5.3  --  5.7  HGB 10.2* 11.2* 7.8*  HCT 31.7* 33.0* 24.1*  MCV 86.6  --  86.7  PLT 183  --  154   Cardiac Enzymes:  Recent Labs Lab 08/04/14 2313  TROPONINI <0.30   CBG:  Recent Labs Lab 08/04/14 2156 08/04/14 2332 08/05/14 0020 08/05/14 0234 08/05/14 0439 08/05/14 0814  GLUCAP 545* 505* 368* 83 88 136*   Hemoglobin A1C:  Recent Labs Lab 08/04/14 2101  HGBA1C 9.9*   Thyroid Function Tests:  Recent Labs Lab 08/05/14 0609  TSH 6.510*   Urine Drug Screen: Drugs of Abuse     Component Value Date/Time   LABOPIA NONE DETECTED 08/05/2014 0744   COCAINSCRNUR NONE DETECTED 08/05/2014 0744   LABBENZ NONE DETECTED 08/05/2014 0744   AMPHETMU NONE DETECTED 08/05/2014 0744   THCU NONE DETECTED 08/05/2014 0744   LABBARB NONE DETECTED 08/05/2014 0744    Urinalysis:  Recent Labs Lab 08/04/14 [place]  YELLOW  LABSPEC 1.010  PHURINE 7.5  GLUCOSEU NEGATIVE  HGBUR NEGATIVE  BILIRUBINUR NEGATIVE  KETONESUR NEGATIVE  PROTEINUR 30*  UROBILINOGEN 0.2  NITRITE NEGATIVE  LEUKOCYTESUR NEGATIVE   Studies/Results: Dg Chest 2 View  08/04/2014   CLINICAL DATA:  Left-sided chest pain.  EXAM: CHEST  2 VIEW  COMPARISON:  01/07/2013.  FINDINGS: The heart size and mediastinal contours are within normal limits. Both lungs are clear. The visualized skeletal structures are unremarkable.  IMPRESSION: Normal chest x-ray.   Electronically Signed   By: Kalman Jewels M.D.   On: 08/04/2014 23:12   Medications: Scheduled Meds: . heparin  5,000 Units Subcutaneous 3 times per day  . insulin aspart  0-9 Units Subcutaneous 6 times per day  . levothyroxine  100  mcg Oral QAC breakfast  . metoCLOPramide  5 mg Oral TID AC  . simvastatin  20 mg Oral q1800  . sodium polystyrene  30 g Oral Once   Continuous Infusions: . 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   Hyperkalemia: Potassium 6.9 upon presentation to ED as improved to 6.1 today. Likely 2/2 poor PO intake, insulin noncompliance, and lisinopril use. She received nebulized albuterol, 1g calcium gluconate, 5u insulin aspart, 46meq sodium bicarb. Peaked T waves on ECG at admission.  - Cont. NS @ 125 mL/hr - Give one dose of Kayexalate 30 g  - Repeat BMP in AM - Hold lisinopril 10mg  daily   Type 1 DM: Poor compliance with insulin regimen. Her previously recommended insulin regimen was novolog 1-6u prn hyperglycemia, 1-5u TID with meals, and Lantus 24u QHS. HgbA1c 9.9 yesterday.  - SSI, accuchecks QID  - Started Lantus 20 U, as recommended by diabetic educator - Diabetic educator consult - Case management for assistance with obtaining insulin - [place] appt on 8/24, where she will be assisted with insulin, glucometer, and other medications - Contacting Butch Penny from [place] for glucometer to help bridge  Gastroparesis/GERD: History of  gastroparesis, nausea and vomiting, and decreased appetite. Previously on Reglan. In addition, had EGD in May 2009 showing acute gastritis.  - Restart reglan 5 mg TID - Consider H2 blocker or PPI  Anemia: H/o of anemia 2/2 to CKD. Hgb 7.8 today down from 10.2 on admission. Baseline of 10-11 g/dL, likely due to hemodilution. TBili 0.3. - Anemia panel to further evaluate  Hypothyroidism: TSH 6.510 today, improved from 28.474 on 02/25/2014. May be contributing to generalized weakness and hypoglycemia.  - Cont. Levothyroxine at 100 mcg daily - Consider dose adjustment as an outpatient - F/u free T4  Chest pain, SOB with exertion: Concerning for possible ACS, pulmonary process, or gastroparesis/GERD in origin. Has a history of cocaine use with positive UDS in the past. Troponin negative, repeat ECG showed no ST changes, CXR normal. O2 sat has been wnl.  - Repeat ECG in  AM - Cont. Simvastatin 20mg  PO daily  AKI on CKD stage 3: SCr stable today at 1.76 from 1.77-2 on admission. Slight improvement with rehydration. Baseline around 1.4. Likely related poor PO intake and use of lisinopril.  - Cont. IV NS - Monitor with BMP - Outpatient nephrology F/U  HTN: Blood pressures elevated on admission up to 194/117 but has been stable overnight.  - Hold lisinopril - Consider to monitor  HLD: LDL 148, HDL 65, Chol 235, and Trigs 110 on 02/25/2014.  - Cont. Simvastatin 20mg  PO daily   FEN:  -FLD, advance diet as tolerated  -NS@125ml /hr   This is a Careers information officer Note.  The care of the patient was discussed with Dr. Alice Rieger and the assessment and plan formulated with their assistance.  Please see their attached note for official documentation of the daily encounter.   LOS: 1 day   Ulice Brilliant, Med Student 08/05/2014, 9:59 AM

## 2014-08-05 NOTE — H&P (Signed)
INTERNAL MEDICINE TEACHING ATTENDING NOTE  Day 1 of stay  Patient name: Angela [name]  MRN: [id number] Date of birth: 1986-08-13   Key clinical points and exam                                                           28 y.o.woman with type 1 DM, medication non-compliance due to financial reasons, and no insurance, hypothyroidism admitted with hypoglycemia and hyperkalemia. I have read the HPI by Dr Randell Patient and I agree with the chronology of events documented.   I met this patient this morning and she reported feeling better - no more nausea or vomiting. She is tearful when I explain the gravity of her condition and the risk of not taking her medications. She reports some chest pain with breathing which has been present for the past 2 days. She also reports some abdominal pain on her left lower quadrant which has been present since last two days. Both pains are dull, and tolerable, last for about 3-4 minutes and resolve on their own. Her exam is benign - alert oriented patient, no acute distress, no chest or abdominal tenderness, cardiac exam regular rate and rhythm, no murmur, abdomen soft non tender normal BS, no pedal edema, neuro grossly normal.   I have reviewed the chart, lab results, EKG, imaging and relevant notes of this patient.   Assessment and Plan                                                                        Uncontrolled IDDM - The patient has advanced IDDM with hypoglycemia (loss of alpha cells along with beta cells) mixed with sustained hyperglycemia because of her medication non-compliance for which I feel reasons are mostly financial. Currently, she will be put on sensitive insulin scale with short acting insulin, and once her hypoglycemia is corrected, we can start lantus. The patient might need an endocrinology referral, and in the long term is a candidate for insulin pump therapy with the prerequisite of being regular and compliant.   Nausea and vomiting - Could be merely  due to high blood sugars, or could be gastroparesis. Would follow symptoms with supportive treatment at this time. The patient was able to eat a hearty breakfast in the morning.   Uncontrolled hypothyroidism - The patient used to be on 100 mcg synthorid and has been restarted on it. Her generalized fatigue and malaise could be due to that. Her last lipid panel was done in March and her LDL was 148 - controlling hypothyroidism will help with lowering LDL as well.     Hyperkalemia 7.1 - Likely secondary to high blood sugars (only a temporary low) with dehydration, acute kidney failure and intake of lisinopril. Trending down, EKG shows no related abnormalities, troponin one set negative, kayexalate given.     Acute on chronic kidney Insufficiency - trending down, likely dehydration with high blood sugars.  Social/financial situation - We will get a social worker consult to provide the patient in the best possible way to care  for her needs. A glucometer on discharge, some low cost or free medications - I would discuss with my team to work with the social worker to provide that if possible.   I have seen and evaluated this patient and discussed it with my IM resident team.  Please see the rest of the plan per resident note from today.   [employer], [place] 08/05/2014, 10:45 AM.

## 2014-08-05 NOTE — Progress Notes (Addendum)
Inpatient Diabetes Program Recommendations  AACE/ADA: New Consensus Statement on Inpatient Glycemic Control (2013)  Target Ranges:  Prepandial:   less than 140 mg/dL      Peak postprandial:   less than 180 mg/dL (1-2 hours)      Critically ill patients:  140 - 180 mg/dL   Please add Lantus 20 units daily for basal needs.  May need to be titrated up depending on fasting CBG in the morning.    Consider a custom Novolog scale: Novolog  150-200 mg/dL=1 unit  201-250 mg/dL=2 units  251-300 mg/dL=3 units  301-350 mg/dL=4 units  351-400 mg/dL=5 units  401-450 mg/dL=6 units  Will also need Novolog meal coverage 1 units for every 15 grams of carbs consumed.   Will follow. ADDENDUM: this coordinator met with patient and her mother to offer support and answer any questions.  No questions/concerns at this time.  Pt reports she will be [facility] up at the [facility] after discharge.  We discussed the cost of a meter and strips at [facility].   Will follow during this admission. Thank you  Raoul Pitch BSN, RN,CDE Inpatient Diabetes Coordinator [phone] (team pager)

## 2014-08-05 NOTE — Progress Notes (Signed)
Subjective: She reports feeling better today. No nausea or vomiting. She also indicates that for a long time she has been experiencing bloating, early satiety, poor appetite, and sometimes vomiting food which has stayed in her stomarch for 3 days.  CBGs are better controlled on sliding scale insulin-sensitive.  Objective: Vital signs in last 24 hours: Filed Vitals:   08/04/14 1845 08/04/14 1936 08/04/14 2352 08/05/14 0649  BP: 158/97 139/94 146/94 113/72  Pulse: 129 124 120 99  Temp:  98 F (36.7 C) 98.2 F (36.8 C) 98.6 F (37 C)  TempSrc:  Oral Oral Oral  Resp: 18 20 17 14   Height:  5\' 5"  (1.651 m)    Weight:  147 lb 1.6 oz (66.724 kg)    SpO2: 100% 100% 100% 97%   Weight change:  No intake or output data in the 24 hours ending 08/05/14 1022  General: Vital signs reviewed. No distress. Mother in the room Lungs: Clear to auscultation bilaterally  Heart: RRR; no extra sounds or murmurs  Abdomen: mild diffuse abdominal tenderness. Bowel sounds present, soft, no hepatosplenomegaly  Extremities: No bilateral ankle edema  Neurologic: Alert and oriented x3. Moves all extremities  Lab Results: Basic Metabolic Panel:  Recent Labs Lab 08/04/14 1622 08/04/14 1629 08/05/14 0609  NA 134* 132* 138  K 7.1* 6.9* 6.1*  CL 98 103 104  CO2 25  --  23  GLUCOSE 85 89 84  BUN 22 24* 23  CREATININE 1.77* 2.00* 1.76*  CALCIUM 10.1  --  8.8   Liver Function Tests:  Recent Labs Lab 08/04/14 1622  AST 23  ALT 13  ALKPHOS 92  BILITOT 0.3  PROT 7.2  ALBUMIN 3.6   CBC:  Recent Labs Lab 08/04/14 1622 08/04/14 1629 08/05/14 0609  WBC 5.3  --  5.7  HGB 10.2* 11.2* 7.8*  HCT 31.7* 33.0* 24.1*  MCV 86.6  --  86.7  PLT 183  --  154   Cardiac Enzymes:  Recent Labs Lab 08/04/14 2313  TROPONINI <0.30   CBG:  Recent Labs Lab 08/04/14 2156 08/04/14 2332 08/05/14 0020 08/05/14 0234 08/05/14 0439 08/05/14 0814  GLUCAP 545* 505* 368* 83 88 136*   Hemoglobin  A1C:  Recent Labs Lab 08/04/14 2101  HGBA1C 9.9*   Thyroid Function Tests:  Recent Labs Lab 08/05/14 0609  TSH 6.510*   Urine Drug Screen: Drugs of Abuse     Component Value Date/Time   LABOPIA NONE DETECTED 08/05/2014 0744   COCAINSCRNUR NONE DETECTED 08/05/2014 0744   LABBENZ NONE DETECTED 08/05/2014 0744   AMPHETMU NONE DETECTED 08/05/2014 0744   THCU NONE DETECTED 08/05/2014 0744   LABBARB NONE DETECTED 08/05/2014 0744    Urinalysis:  Recent Labs Lab 08/04/14 1639  COLORURINE YELLOW  LABSPEC 1.010  PHURINE 7.5  GLUCOSEU NEGATIVE  HGBUR NEGATIVE  BILIRUBINUR NEGATIVE  KETONESUR NEGATIVE  PROTEINUR 30*  UROBILINOGEN 0.2  NITRITE NEGATIVE  LEUKOCYTESUR NEGATIVE    Micro Results: No results found for this or any previous visit (from the past 240 hour(s)). Studies/Results: Dg Chest 2 View  08/04/2014   CLINICAL DATA:  Left-sided chest pain.  EXAM: CHEST  2 VIEW  COMPARISON:  01/07/2013.  FINDINGS: The heart size and mediastinal contours are within normal limits. Both lungs are clear. The visualized skeletal structures are unremarkable.  IMPRESSION: Normal chest x-ray.   Electronically Signed   By: Kalman Jewels M.D.   On: 08/04/2014 23:12   Medications: I have reviewed the patient's current medications.  Scheduled Meds: . heparin  5,000 Units Subcutaneous 3 times per day  . insulin aspart  0-9 Units Subcutaneous 6 times per day  . levothyroxine  100 mcg Oral QAC breakfast  . metoCLOPramide  5 mg Oral TID AC  . simvastatin  20 mg Oral q1800  . sodium polystyrene  30 g Oral Once   Continuous Infusions: . 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 Related to ACE inhibitors in the setting of poor by mouth intake, and acute on chronic renal injury. She received calcium gluconate, insulin, and sodium bicarbonate on presentation. Potassium level is improved from 7.1-6.1. Initially has peaked t waves on ekg. Telemetry with sinus tachycardia of about 110. Plan - Obtain an EKG due to reported chest pain. >> tachycardia. Qtc normal. Initial peaked T waves have improved  - PO Kayexalate 30 g. Can give rectally if she can not tolerate it orally - can give another dose of Kayexalate later today fi K is still high - BMET at 12pm today - Hold Lisinopril  - cont with IVF at 125  - cont with SSI for DM - keep on tele  Gastroparesis/GERD:  Patient reports some symptoms (early satiety, vomiting, bloating and abdominal pain), which are concerning for worsening gastroparesis. She had an EGD in May 2009 which revealed subepithelial hemorrhage in the stomach and acute gastritis. She was told by her gastroenterologist that she has gastroparesis, and she was put on Reglan for some time, but she is been off for quite a while. Plan. -Will continue with by mouth Reglan 5 mg 3 times a day 15 minutes before meals. This can be titrated up based on clinical response - FLD for now and advance as tolerable - monitor closely for side effects of Reglan like anxiety, restlessness, and depression, hyperprolactinemia, and QT interval prolongation and extrapyramidal side effects. - if symptoms persist, she might need gastric emptying study  T1DM with hypoglycemia: Presented with hypoglycemia of 51 which has since improved. A1c of 9.9%. CBG is better overnight. The main issue has been medication noncompliance due to cost.  She has no glucometer for 1 year. Plan -carb modified diet. -SSI -- sensitive. -Can add long-acting insulin if CBG is elevated. - Case management has discussed with the patient and an appointment for outpatient follow up has been arranged on 08/11/2014 with Dr Ahmed Prima at the [facility]. There, she'll be assisted with Lantus. She has also been provided with samples of Lantus by case management.  - will talk to Butch Penny to see if she can get her meter - She will also meet with the financial counselor for orange card application at [facility] per case management.  Anemia secondary to CKD3: Baseline hemoglobin between 10 and 11 g/dL. Overnight, her hemoglobin trended down from 11.2-7.8. She received some IV fluids, and this could be due to hemodilution. Plan  - Anemia panel  - cbc in am  - follow up with renal as outpatient  Hypothyroidism: Questionable compliance with Synthroid. TSH is  better from 28.5 in March 2015 to 6.5 on 08/05/2014 Plan  - cont with current dose of Synthroid and consider dose adjustment as outpatient  AKI on CKD 3: Baseline creatinine of around 1.7. On presentation it had increased to 2.0. Slight improvement with overnight rehydration. AKI likely related to poor by mouth intake and use of lisinopril. Plan  - continue with IV rehydration - Monitor with BMP - Outpatient nephrology followup  Hypertension: stable. On home lisinopril. Continue restarting this medication if renal function is stable. If can not tolerate ACEi, we can try a CCB  FEN:  -FLD, advance diet as tolerated  -NS@125ml /hr   DVT ppx: subq hep 5000u TID   Dispo: Disposition is deferred at this time, awaiting improvement of current medical problems. Anticipated discharge tomorrow based on her improvement with hyperkalemia  The patient does have a current PCP (No Pcp Per Patient - Has appt with [facility]) and does need an [facility] hospital follow-up appointment after discharge.   The patient  does not know have transportation limitations that hinder transportation to clinic appointments    LOS: 1 day   Jessee Avers PGY 3 - Internal Medicine Teaching Service Pager: [phone] 08/05/2014, 10:22 AM

## 2014-08-05 NOTE — Progress Notes (Signed)
CARE MANAGEMENT NOTE 08/05/2014  Patient:  Angela [name], Angela [name]   Account Number:  [id number]  Date Initiated:  08/05/2014  Documentation initiated by:  [facility]  Subjective/Objective Assessment:   Type I Diabetes     Action/Plan:   Anticipated DC Date:  08/06/2014   Anticipated DC Plan:  [place]  CM consult  Medication Assistance  [facility]      Choice offered to / List presented to:             Status of service:  Completed, signed off Medicare Important Message given?   (If response is "NO", the following Medicare IM given date fields will be blank) Date Medicare IM given:   Medicare IM given by:   Date Additional Medicare IM given:   Additional Medicare IM given by:    Discharge Disposition:  HOME/SELF CARE  Per UR Regulation:    If discussed at Long Length of Stay Meetings, dates discussed:    Comments:  08/05/2014 1240 NCM spoke to pt and states she has Lantus and Novolog pens at home. Pt has [employer] brochure with appt for 8/24 at 9 am. Explained to pt that [facility] will assist her with applying for Lantus PAP for 1 year free medication. States she was on the program a few years ago. Spoke to [facility] pharmacist and they have [place] free at clinic. After she sees the [facility] physician they will assist her with obtaining insulin through the clinic. Jonnie Finner RN CCM Case Mgmt phone [phone]

## 2014-08-05 NOTE — Progress Notes (Signed)
I have seen the patient and reviewed the daily progress note by Nadine Counts MS IV and discussed the care of the patient with them.  Please see note for my findings, assessment, and plans/additions.

## 2014-08-06 ENCOUNTER — Inpatient Hospital Stay (HOSPITAL_COMMUNITY): Payer: Self-pay

## 2014-08-06 DIAGNOSIS — D631 Anemia in chronic kidney disease: Secondary | ICD-10-CM

## 2014-08-06 DIAGNOSIS — N039 Chronic nephritic syndrome with unspecified morphologic changes: Secondary | ICD-10-CM

## 2014-08-06 DIAGNOSIS — R1032 Left lower quadrant pain: Secondary | ICD-10-CM

## 2014-08-06 LAB — URINE MICROSCOPIC-ADD ON

## 2014-08-06 LAB — BASIC METABOLIC PANEL
ANION GAP: 9 (ref 5–15)
BUN: 13 mg/dL (ref 6–23)
CHLORIDE: 109 meq/L (ref 96–112)
CO2: 22 mEq/L (ref 19–32)
Calcium: 8.7 mg/dL (ref 8.4–10.5)
Creatinine, Ser: 1.53 mg/dL — ABNORMAL HIGH (ref 0.50–1.10)
GFR calc Af Amer: 52 mL/min — ABNORMAL LOW (ref >90)
GFR calc non Af Amer: 45 mL/min — ABNORMAL LOW (ref >90)
Glucose, Bld: 123 mg/dL — ABNORMAL HIGH (ref 70–99)
Potassium: 4.8 mEq/L (ref 3.7–5.3)
Sodium: 140 mEq/L (ref 137–147)

## 2014-08-06 LAB — GLUCOSE, CAPILLARY
GLUCOSE-CAPILLARY: 173 mg/dL — AB (ref 70–99)
Glucose-Capillary: 127 mg/dL — ABNORMAL HIGH (ref 70–99)
Glucose-Capillary: 258 mg/dL — ABNORMAL HIGH (ref 70–99)
Glucose-Capillary: 93 mg/dL (ref 70–99)

## 2014-08-06 LAB — URINALYSIS, ROUTINE W REFLEX MICROSCOPIC
BILIRUBIN URINE: NEGATIVE
Glucose, UA: NEGATIVE mg/dL
Ketones, ur: NEGATIVE mg/dL
Leukocytes, UA: NEGATIVE
NITRITE: NEGATIVE
Protein, ur: 30 mg/dL — AB
Specific Gravity, Urine: 1.008 (ref 1.005–1.030)
UROBILINOGEN UA: 0.2 mg/dL (ref 0.0–1.0)
pH: 7 (ref 5.0–8.0)

## 2014-08-06 LAB — CBC
HCT: 23.2 % — ABNORMAL LOW (ref 36.0–46.0)
HEMOGLOBIN: 7.3 g/dL — AB (ref 12.0–15.0)
MCH: 28 pg (ref 26.0–34.0)
MCHC: 31.5 g/dL (ref 30.0–36.0)
MCV: 88.9 fL (ref 78.0–100.0)
Platelets: 145 10*3/uL — ABNORMAL LOW (ref 150–400)
RBC: 2.61 MIL/uL — AB (ref 3.87–5.11)
RDW: 13.3 % (ref 11.5–15.5)
WBC: 4.5 10*3/uL (ref 4.0–10.5)

## 2014-08-06 LAB — OCCULT BLOOD X 1 CARD TO LAB, STOOL: Fecal Occult Bld: NEGATIVE

## 2014-08-06 MED ORDER — LEVOTHYROXINE SODIUM 100 MCG PO TABS: 100.0000 ug | ORAL_TABLET | Freq: Every day | ORAL | Status: DC

## 2014-08-06 MED ORDER — DOCUSATE SODIUM 100 MG PO CAPS: 100.0000 mg | ORAL_CAPSULE | Freq: Every day | ORAL | Status: DC | PRN

## 2014-08-06 MED ORDER — METOCLOPRAMIDE HCL 5 MG PO TABS: 5.0000 mg | ORAL_TABLET | Freq: Three times a day (TID) | ORAL | Status: DC

## 2014-08-06 MED ORDER — DSS 100 MG PO CAPS: 100.0000 mg | ORAL_CAPSULE | Freq: Every day | ORAL | Status: DC | PRN

## 2014-08-06 MED ORDER — FERROUS SULFATE 325 (65 FE) MG PO TABS
325.0000 mg | ORAL_TABLET | Freq: Three times a day (TID) | ORAL | Status: DC
  Administered 2014-08-06: 325 mg via ORAL
  Filled 2014-08-06 (×3): qty 1

## 2014-08-06 MED ORDER — ALBUTEROL SULFATE HFA 108 (90 BASE) MCG/ACT IN AERS: 1.0000 | INHALATION_SPRAY | Freq: Four times a day (QID) | RESPIRATORY_TRACT | Status: DC | PRN

## 2014-08-06 MED ORDER — INSULIN ASPART 100 UNIT/ML ~~LOC~~ SOLN: 1.0000 [IU] | Freq: Three times a day (TID) | SUBCUTANEOUS | Status: DC

## 2014-08-06 MED ORDER — CYANOCOBALAMIN 250 MCG PO TABS: 250.0000 ug | ORAL_TABLET | Freq: Every day | ORAL | Status: DC

## 2014-08-06 MED ORDER — CYANOCOBALAMIN 250 MCG PO TABS
250.0000 ug | ORAL_TABLET | Freq: Every day | ORAL | Status: DC
  Administered 2014-08-06: 250 ug via ORAL
  Filled 2014-08-06: qty 1

## 2014-08-06 MED ORDER — ACETAMINOPHEN 325 MG PO TABS: 650.0000 mg | ORAL_TABLET | Freq: Four times a day (QID) | ORAL | Status: DC | PRN

## 2014-08-06 MED ORDER — AMLODIPINE BESYLATE 5 MG PO TABS
5.0000 mg | ORAL_TABLET | Freq: Once | ORAL | Status: AC
  Administered 2014-08-06: 5 mg via ORAL
  Filled 2014-08-06: qty 1

## 2014-08-06 MED ORDER — AMLODIPINE BESYLATE 5 MG PO TABS
5.0000 mg | ORAL_TABLET | Freq: Every day | ORAL | Status: DC
Start: 2014-08-06 — End: 2014-08-06
  Administered 2014-08-06: 5 mg via ORAL
  Filled 2014-08-06: qty 1

## 2014-08-06 MED ORDER — AMLODIPINE BESYLATE 5 MG PO TABS: 5.0000 mg | ORAL_TABLET | Freq: Every day | ORAL | Status: DC

## 2014-08-06 MED ORDER — INSULIN GLARGINE 100 UNIT/ML ~~LOC~~ SOLN: 24.0000 [IU] | Freq: Every day | SUBCUTANEOUS | Status: DC

## 2014-08-06 MED ORDER — AMLODIPINE BESYLATE 10 MG PO TABS: 5.0000 mg | ORAL_TABLET | Freq: Every day | ORAL | Status: DC

## 2014-08-06 MED ORDER — FEXOFENADINE HCL 180 MG PO TABS: 180.0000 mg | ORAL_TABLET | Freq: Every day | ORAL | Status: DC | PRN

## 2014-08-06 MED ORDER — FERROUS SULFATE 325 (65 FE) MG PO TABS: 325.0000 mg | ORAL_TABLET | Freq: Three times a day (TID) | ORAL | Status: DC

## 2014-08-06 NOTE — Progress Notes (Signed)
  PROGRESS NOTE MEDICINE TEACHING ATTENDING   Day 2 of stay Patient name: Angela [name]   Medical record number: [id number] Date of birth: [birth year]   Saw the patient with the team today. Mild tenderness in LLQ.   Assessment and Plan                                                                        Hyperkalemia - resolved. We will hold lisinopril until the patient demonstrates stable blood sugars and compliance with insulin. Defer to PCP for reassessment.   Gastroparesis - Regular meals today.   DM1 - CBGs stable today. Continue current regimen. Will be followed by [facility] from now on and not our clinic because of ease of medication supply at a lower cost etc.   LLQ pain - UA was clean at admission. Agreewith repeating UA. Check STDs GC/Chalymidia/Wet prep  Hypertension uncontrolled - agree with amlodipine, review blood pressure before discharge.    I have discussed the care of this patient with my IM team residents. Please see the resident note for details.  [place], [facility] 08/06/2014, 11:30 AM.

## 2014-08-06 NOTE — Progress Notes (Addendum)
Subjective: Overall, she was feeling well. Endorses mild fatigue that is not different from her baseline. Potassium reduced to 4.8 after 2 doses of Kayexalate 30g yesterday. She also notes LLQ abdominal pain and dysuria at the end of her urine stream that has been present for 4 days. Denies chest pain, shortness of breath,  blood in stool, fevers, chills, nausea, vomiting, and weakness.   Objective: Vital signs in last 24 hours: Filed Vitals:   08/05/14 1352 08/05/14 2111 08/05/14 2113 08/06/14 0640  BP: 137/88 144/84 152/99 152/105  Pulse: 101 76 101 93  Temp: 98.6 F (37 C) 99.1 F (37.3 C) 98.4 F (36.9 C) 98.5 F (36.9 C)  TempSrc: Oral Oral Oral Oral  Resp: 16 15 16 14   Height:      Weight:      SpO2: 98% 98% 100% 97%   Weight change:   Intake/Output Summary (Last 24 hours) at 08/06/14 0718 Last data filed at 08/05/14 2111  Gross per 24 hour  Intake   2190 ml  Output    400 ml  Net   1790 ml   BP 152/105  Pulse 93  Temp(Src) 98.5 F (36.9 C) (Oral)  Resp 14  Ht 5\' 5"  (1.651 m)  Wt 66.724 kg (147 lb 1.6 oz)  BMI 24.48 kg/m2  SpO2 97%  LMP 07/04/2014 General appearance: alert, cooperative and no distress Lungs: clear to auscultation bilaterally Heart: regular rate and rhythm, S1, S2 normal, no murmur, click, rub or gallop Abdomen: soft, bowel sounds normal, no masses or organomegaly, moderately tender to palpation in LLQ Pelvic: normal appearing vaginal mucosa and cervix, cervix appears smooth, with no visible lesions, erosions, or scars, white discharge present around cervical os Back: No CVA tenderness Extremities: extremities normal, atraumatic, no cyanosis or edema  Lab Results: Basic Metabolic Panel:  Recent Labs Lab 08/05/14 1832 08/06/14 0500  NA 139 140  K 4.8 4.8  CL 105 109  CO2 22 22  GLUCOSE 141* 123*  BUN 15 13  CREATININE 1.44* 1.53*  CALCIUM 9.0 8.7   Liver Function Tests:  Recent Labs Lab 08/04/14 1622  AST 23  ALT 13  ALKPHOS  92  BILITOT 0.3  PROT 7.2  ALBUMIN 3.6   CBC:  Recent Labs Lab 08/05/14 0609 08/06/14 0500  WBC 5.7 4.5  HGB 7.8* 7.3*  HCT 24.1* 23.2*  MCV 86.7 88.9  PLT 154 145*   Cardiac Enzymes:  Recent Labs Lab 08/04/14 2313  TROPONINI <0.30   CBG:  Recent Labs Lab 08/05/14 0814 08/05/14 1148 08/05/14 1646 08/05/14 1955 08/06/14 0006 08/06/14 0402  GLUCAP 136* 223* 151* 125* 93 173*   Hemoglobin A1C:  Recent Labs Lab 08/04/14 2101  HGBA1C 9.9*   Thyroid Function Tests:  Recent Labs Lab 08/05/14 0609  TSH 6.510*  FREET4 0.89   Anemia Panel:  Recent Labs Lab 08/05/14 0609  VITAMINB12 241  FOLATE 10.0  FERRITIN 34  TIBC 269  IRON 38*  RETICCTPCT 1.5   Urine Drug Screen: Drugs of Abuse     Component Value Date/Time   LABOPIA NONE DETECTED 08/05/2014 0744   COCAINSCRNUR NONE DETECTED 08/05/2014 0744   LABBENZ NONE DETECTED 08/05/2014 0744   AMPHETMU NONE DETECTED 08/05/2014 0744   THCU NONE DETECTED 08/05/2014 0744   LABBARB NONE DETECTED 08/05/2014 0744    Urinalysis:  Recent Labs Lab 08/04/14 1639  COLORURINE YELLOW  LABSPEC 1.010  PHURINE 7.5  GLUCOSEU NEGATIVE  HGBUR NEGATIVE  BILIRUBINUR NEGATIVE  KETONESUR NEGATIVE  PROTEINUR 30*  UROBILINOGEN 0.2  NITRITE NEGATIVE  LEUKOCYTESUR NEGATIVE   Studies/Results: Dg Chest 2 View  08/04/2014   CLINICAL DATA:  Left-sided chest pain.  EXAM: CHEST  2 VIEW  COMPARISON:  01/07/2013.  FINDINGS: The heart size and mediastinal contours are within normal limits. Both lungs are clear. The visualized skeletal structures are unremarkable.  IMPRESSION: Normal chest x-ray.   Electronically Signed   By: Kalman Jewels M.D.   On: 08/04/2014 23:12   Medications:  Scheduled Meds: . heparin  5,000 Units Subcutaneous 3 times per day  . insulin aspart  0-9 Units Subcutaneous 6 times per day  . insulin glargine  10 Units Subcutaneous Daily  . levothyroxine  100 mcg Oral QAC breakfast  . metoCLOPramide  5 mg  Oral TID AC  . 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   Hyperkalemia: Resolved. Likely related to ACE inhibitors in the setting of poor by mouth intake, and acute on chronic renal injury. She received calcium gluconate, insulin, and sodium bicarbonate on presentation. In addition, she received two doses of 30 g Kayexalate PO. Potassium level is improved from 7.1 -> 4.8. Initially had peaked T waves on ECG.  Plan  - Obtain an ECG due to reported chest pain. >> tachycardia. Qtc normal. Initial peaked T waves have improved  - Hold Lisinopril  - cont with SSI for DM  - D/c tele   Gastroparesis/GERD: Patient reports some symptoms (early satiety, vomiting, bloating and abdominal pain), which are concerning for worsening gastroparesis. She had an EGD in May 2009 which revealed subepithelial hemorrhage in the stomach and acute gastritis. She was told by her gastroenterologist that she has gastroparesis, and she was put on Reglan for some time, but she is been off for quite a while.  Plan.  - Will continue with Reglan 5 mg 3 times a day 15 minutes before meals. This can be titrated up based on clinical response  - Monitor closely for side effects of Reglan like anxiety,  restlessness, and depression, hyperprolactinemia, and QT interval prolongation and extrapyramidal side effects.   T1DM with hypoglycemia: Presented with hypoglycemia of 51 which has since improved. A1c of 9.9%. CBG is better overnight. The main issue has been medication noncompliance due to cost. She has no glucometer for 1 year.  Plan  - Carb modified diet.  - Sensitive SSI and Lantus - Case management has discussed with the patient and an appointment for outpatient follow up has been arranged on 08/11/2014 with Dr Ahmed Prima at the [facility]. There, she'll be assisted with Lantus. She has also been provided with samples of Lantus by case management.  - Will talk to Butch Penny to see if she can get her meter  - She will also meet with the financial counselor for orange card application at [facility] per case management.   Anemia secondary  to CKD3: Baseline hemoglobin between 10 and 11 g/dL. Overnight, her hemoglobin trended down from 11.2 -> 7.3. 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 She received some IV fluids, and this could be due to hemodilution. Folate wnl, Vitamin B12 241. Plan  - Ferrous sulfate 325 mg TID  - Colace 100 mg PO daily PRN for constipation - Close follow-up at [facility] on 8/24 - Follow up with renal as outpatient  - Supplement Vitamin B12 250 mcg daily  Abdominal pain: LLQ abdominal pain, noticeable after she urinates. Considering UTI, PID, salpingitis, ovarian cyst, ovarian torsion, nephrolithiasis, IBD, and IBS. Initial U/A on admission was normal. No fevers, chills, CVA tenderness.  Plan.  - Repeat U/A - negative for UTI and stones - LLQ abdominal ultrasound - negative for abnormalities - Consider abd/pelvis non-contrast CT, if concerned for nephrolithiasis - Check Gc/chlamydia - negative - F/u wet prep  Hypothyroidism: Questionable compliance with Synthroid. TSH is better from 28.5 in March 2015 to 6.5  on 08/05/2014  Plan  - Cont with current dose of Synthroid and consider dose adjustment as outpatient   AKI on CKD 3: Creatinine 1.53 today. Baseline creatinine of around 1.7. On presentation it had increased to 2.0. Slight improvement with IV rehydration. AKI likely related to poor by mouth intake and use of lisinopril.  Plan  - Monitor with BMP  - Outpatient nephrology followup   Hypertension: Elevated but stable. Holding her home lisinopril.  - Start amlodipine 5 mg, may increase dose as necessary - Discuss lowering dose of lisinopril with PCP  FEN:  - FLD, advance diet as tolerated   DVT ppx: subq hep 5000u TID  This is a Careers information officer Note.  The care of the patient was discussed with Dr. Alice Rieger and the assessment and plan formulated with their assistance.  Please see their attached note for official documentation of the daily encounter.   LOS: 2 days   Ulice Brilliant, Med Student 08/06/2014, 7:18 AM

## 2014-08-06 NOTE — Discharge Summary (Signed)
Name: Angela [name] MRN: [id number] DOB: 03/25/1986 28 y.o. 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     albuterol 108 (90 BASE) MCG/ACT inhaler  Commonly known as:  PROVENTIL HFA;VENTOLIN HFA  Inhale 1-2 puffs into the lungs every 6 (six) hours as needed for wheezing or shortness of breath.     amLODipine 10 MG tablet  Commonly known as:  NORVASC  Take 0.5 tablets (5 mg total) by mouth daily.     DSS 100 MG Caps  Take 100 mg by mouth daily as needed for mild constipation.     ferrous sulfate 325 (65 FE) MG tablet  Take 1 tablet (325 mg total) by mouth 3 (three) times daily with meals.     fexofenadine 180 MG tablet  Commonly known as:  ALLEGRA  Take 180 mg by mouth daily as needed for allergies or rhinitis.     insulin aspart 100 UNIT/ML injection  Commonly known as:  novoLOG  Inject 1-5 Units into the skin 3 (three) times daily with meals. Give 1 unit per 15 grams of carbohydrates with goal CBG 150     insulin glargine 100 UNIT/ML injection  Commonly known  as:  LANTUS  Inject 24 Units into the skin at bedtime.     levothyroxine 100 MCG tablet  Commonly known as:  SYNTHROID, LEVOTHROID  Take 100 mcg by mouth daily before breakfast.     metoCLOPramide 5 MG tablet  Commonly known as:  REGLAN  Take 1 tablet (5 mg total) by mouth 3 (three) times daily before meals.     pravastatin 20 MG tablet  Commonly known as:  PRAVACHOL  Take 20 mg by mouth every evening.     vitamin B-12 250 MCG tablet  Commonly known as:  CYANOCOBALAMIN  Take 1 tablet (250 mcg total) by mouth daily.        Disposition and follow-up:   Angela [name] was discharged from [facility] in Stable condition.  At the hospital follow up visit please address:  1. Type 1 Diabetes Mellitus: Please evaluate her daily blood glucose measurements and BMP. She will need to obtain Lantus, Novolog, glucometer, and test strips for management of her Type 1 diabetes mellitus. She will be applying for an orange card to help with obtaining her medications.  2. CKD Stage 3: Please consider arranging outpatient follow-up with nephrology for management of her CKD and anemia secondary to CKD.   4. Anemia: Please evaluate her CBC and  address anemia.      5. Labs / imaging needed at time of follow-up: Basic metabolic panel, blood glucose, CBC.  6. Pending labs/ test needing follow-up: GC/Chlamydia urine probe, Wet prep  Follow-up Appointments:     Follow-up Information   Follow up with [facility] On 08/11/2014. (F/U appt with Dr. Oswaldo [name] 08/11/14 at 9:00 am. You may pick up your medications from the [facility] at discounted price. )    Specialty:  Internal Medicine   Contact information:   [id number] [place] [address] [phone]      Discharge Instructions: Discharge Instructions   Call MD for:  difficulty breathing, headache or visual disturbances    Complete by:  As directed       Call MD for:  persistant dizziness or light-headedness    Complete by:  As directed      Call MD for:  persistant nausea and vomiting    Complete by:  As directed      Call MD for:  redness, tenderness, or signs of infection (pain, swelling, redness, odor or green/yellow discharge around incision site)    Complete by:  As directed      Call MD for:  severe uncontrolled pain    Complete by:  As directed      Call MD for:  temperature >100.4    Complete by:  As directed      Diet - low sodium heart healthy    Complete by:  As directed      Increase activity slowly    Complete by:  As directed            Consultations:   None  Procedures Performed:  Dg Chest 2 View  08/04/2014   CLINICAL DATA:  Left-sided chest pain.  EXAM: CHEST  2 VIEW  COMPARISON:  01/07/2013.  FINDINGS: The heart size and mediastinal contours are within normal limits. Both lungs are clear. The visualized skeletal structures are unremarkable.  IMPRESSION: Normal chest x-ray.   Electronically Signed   By: Kalman Jewels M.D.   On: 08/04/2014 23:12   US Abdomen Complete  08/06/2014   CLINICAL DATA:  Left lower quadrant pain  EXAM: ULTRASOUND ABDOMEN COMPLETE  COMPARISON:  Ultrasound 06/14/2013  FINDINGS: Gallbladder:  No gallstones or wall thickening visualized. No sonographic Murphy sign noted.  Common bile duct:  Diameter: 2.5 mm  Liver:  No focal lesion identified. Within normal limits in parenchymal echogenicity.  IVC:  No abnormality visualized.  Pancreas:  Visualized portion unremarkable.  Spleen:  Size and appearance within normal limits. Small amount of perisplenic fluid of uncertain etiology. Spleen measures 5.2 cm.  Right Kidney:  Length: 11.6 cm. Echogenicity within normal limits. No mass or hydronephrosis visualized.  Left Kidney:  Length: 11.0 cm. Echogenicity within normal limits. No mass or hydronephrosis visualized.  Abdominal aorta:  No aneurysm visualized.  Other findings:  None.  IMPRESSION: Negative for  gallstones.  Small amount of perisplenic fluid of uncertain etiology. Spleen size is normal. No other areas of fluid in the abdomen.   Electronically Signed   By: Franchot Gallo M.D.   On: 08/06/2014 09:39    Admission HPI:  Chief Complaint: weakness  History of Present Illness: Ms. Massie is a 28 year old woman with history of DM type 1 with medication noncompliance, hypothyroidism, HTN, GERD, anemia, depression presenting with weakness and hypoglycemia. Last night she took 10u of novolog. Woke up this  morning and had some orange juice prior to going shopping with her mother. She suddenly became weak, diaphoretic and shaky. She fell asleep in the car. Her mother tried to give her some orange juice on the way to the ED. She last was hospitalized for hypoglycemia 09/08/2013 which improved with 3 amps of D50. She has been having a poor appetite, which she tries to stimulate with daily marijuana use. She has bilious, nonbloody emesis in the mornings for the past few months. She has not been taking her Reglan. She takes insulin when she does eat - 10u of Novolog, which she gets insulin pens from friends. She rarely takes her Lantus. She says her BG greater than 500. Per patient's mother, she does not have a reliable method of measuring blood sugar at home. Reports fatigue, chills for the past week, chest pain with deep breathing, SOB with exertion, occasional epigastric pain, LH/dizziness with moving. She has lost 15 lbs intentionally - exercising every morning and evening. She had diarrhea 1 week ago that has resolved spontaneously. Denies fevers or dysuria. Her roommate, niece, and nephew have been having cold symptoms.  In the ED her BG was 51. It increased to 75 after eating crackers and a sandwich.   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 Thedford presented to the [facility] ED with weakness, nausea, vomiting, and hypoglycemia. She was admitted for hyperkalemia (Potassium - 7.1) with peak T-waves on ECG. Administration of calcium gluconate, insulin, sodium bicarbonate, and Kayexalate resulted in resolution of her hyperkalemia to a serum potassium of 4.8. Sliding scale Novolog insulin and Lantus insulin was used throughout this hospitalization. We held her Lisinopril in the setting of hyperkalemia and acute kidney injury. She was scheduled for close follow-up with the [facility] on Monday (8/24). Please check a basic metabolic panel to determine her serum potassium levels.  Type 1 Diabetes Mellitus with Hypoglycemia: She presented to the [facility] ED with a hypoglycemia of 51 and a HgbA1c of 9.9%. There has been insulin noncompliance due to cost, and she has been without a glucometer for 1 year. She was seen by a Diabetic Counselor who recommended use of Lantus and close outpatient follow-up. She given a carbohydrate modified diet, and her blood glucose levels were controlled on a sensitive sliding scale Novolog insulin and Lantus insulin. Case management arranged her follow-up appointment at the [place], where they will assist with obtaining Lantus for free or low cost, and will set up a meeting with the financial counselor for an orange card. ReliOn glucometer was ordered and this will cost $9 at [facility] plus $9 for testing strips. She was encouraged to bring her glucometer at outpatient visit.   Gastroparesis/GERD: Patient reported symptoms of early satiety, vomiting, bloating, and  abdominal pain, which were concerning for worsening gastroparesis. She had an EGD in May 2009 that revealed sub-epithelial hemorrhage in the stomach and acute gastritis. She was told by her gastroenterologist that she has gastroparesis, and she was put on Reglan for some  time, but she is been off for quite a while. We started Reglan 5 mg TID before meals. Please titrate up based upon clinical response and monitor closely for side effects of Reglan like anxiety, restlessness, and depression, hyperprolactinemia, and QT interval prolongation and extrapyramidal side effects.   Anemia secondary to CKD stage 3: Her baseline hemoglobin is between 10 and 11 g/dL. Over the course of the hospitalization, her hemoglobin trended down from 11.2 to 7.3. An anemia panel revealed low iron and ferritin, likely due to mixed CKD and iron-deficiency anemia. Her folate was normal, and Vitamin B12 was low normal. She was started on Vitamin B12 supplementation and ferrous sulfate, with Colace for constipation. Please consider referring her to follow up with nephrology as an outpatient for management.  LLQ Abdominal pain: She has been experiencing LLQ abdominal pain and dysuria at the end of her urine stream for 4 days now. Pelvic exam was performing showing minor white discharge near cervical os. A urine analysis was performed twice and was found to be normal, and her abdominal ultrasound was normal. A GC/chlamydia test and wet prep were pending on discharge. Please follow-up with the GC/chlamydia result and wet prep and treat if necessary. Also, please consider an abdomen and pelvis non-contrast CT, if there is concern for nephrolithiasis.   Hypothyroidism: She was continued on her current Synthroid at 100 mcg during this admission. Her TSH was found to be 6.5 and free T4 was 0.89. This was improved from her prior TSH of 28.5 measured in March 2015. Please address at her outpatient appointment.   AKI on CKD 3: Her creatinine on  admission was 1.76, which had increased to 2.0, and then improved with IV rehydration. This acute kidney injury was likely related to poor intake by mouth and use of lisinopril. At discharge her creatinine was 1.53. Please consider referring her to follow up with nephrology as an outpatient for management. She will also require a basic metabolic panel at hospital follow-up.   Hypertension: She had been taking Lisinopril prior to admission for blood pressure management. Her Lisinopril was held due to hyperkalemia. She was started on Amlodipine 10 mg PO for blood pressure control.    Discharge Vitals:   BP 133/88  Pulse 93  Temp(Src) 98.5 F (36.9 C) (Oral)  Resp 14  Ht 5\' 5"  (1.651 m)  Wt 147 lb 1.6 oz (66.724 kg)  BMI 24.48 kg/m2  SpO2 97%  LMP 07/04/2014  Discharge Labs:  Results for orders placed during the hospital encounter of 08/04/14 (from the past 24 hour(s))  GLUCOSE, CAPILLARY     Status: Abnormal   Collection Time    08/05/14  4:46 PM      Result Value Ref Range   Glucose-Capillary 151 (*) 70 - 99 mg/dL  BASIC METABOLIC PANEL     Status: Abnormal   Collection Time    08/05/14  6:32 PM      Result Value Ref Range   Sodium 139  137 - 147 mEq/L   Potassium 4.8  3.7 - 5.3 mEq/L   Chloride 105  96 - 112 mEq/L   CO2 22  19 - 32 mEq/L   Glucose, Bld 141 (*) 70 - 99 mg/dL   BUN 15  6 - 23 mg/dL   Creatinine, Ser 1.44 (*) 0.50 - 1.10 mg/dL   Calcium 9.0  8.4 - 10.5 mg/dL   GFR calc non Af Amer 48 (*) >90 mL/min   GFR calc Af Amer 56 (*) >  90 mL/min   Anion gap 12  5 - 15  GLUCOSE, CAPILLARY     Status: Abnormal   Collection Time    08/05/14  7:55 PM      Result Value Ref Range   Glucose-Capillary 125 (*) 70 - 99 mg/dL  GLUCOSE, CAPILLARY     Status: None   Collection Time    08/06/14 12:06 AM      Result Value Ref Range   Glucose-Capillary 93  70 - 99 mg/dL   Comment 1 Documented in Chart     Comment 2 Notify RN    GLUCOSE, CAPILLARY     Status: Abnormal    Collection Time    08/06/14  4:02 AM      Result Value Ref Range   Glucose-Capillary 173 (*) 70 - 99 mg/dL  BASIC METABOLIC PANEL     Status: Abnormal   Collection Time    08/06/14  5:00 AM      Result Value Ref Range   Sodium 140  137 - 147 mEq/L   Potassium 4.8  3.7 - 5.3 mEq/L   Chloride 109  96 - 112 mEq/L   CO2 22  19 - 32 mEq/L   Glucose, Bld 123 (*) 70 - 99 mg/dL   BUN 13  6 - 23 mg/dL   Creatinine, Ser 1.53 (*) 0.50 - 1.10 mg/dL   Calcium 8.7  8.4 - 10.5 mg/dL   GFR calc non Af Amer 45 (*) >90 mL/min   GFR calc Af Amer 52 (*) >90 mL/min   Anion gap 9  5 - 15  CBC     Status: Abnormal   Collection Time    08/06/14  5:00 AM      Result Value Ref Range   WBC 4.5  4.0 - 10.5 K/uL   RBC 2.61 (*) 3.87 - 5.11 MIL/uL   Hemoglobin 7.3 (*) 12.0 - 15.0 g/dL   HCT 23.2 (*) 36.0 - 46.0 %   MCV 88.9  78.0 - 100.0 fL   MCH 28.0  26.0 - 34.0 pg   MCHC 31.5  30.0 - 36.0 g/dL   RDW 13.3  11.5 - 15.5 %   Platelets 145 (*) 150 - 400 K/uL  OCCULT BLOOD X 1 CARD TO LAB, STOOL     Status: None   Collection Time    08/06/14  8:00 AM      Result Value Ref Range   Fecal Occult Bld NEGATIVE  NEGATIVE  URINALYSIS, ROUTINE W REFLEX MICROSCOPIC     Status: Abnormal   Collection Time    08/06/14  8:09 AM      Result Value Ref Range   Color, Urine YELLOW  YELLOW   APPearance CLEAR  CLEAR   Specific Gravity, Urine 1.008  1.005 - 1.030   pH 7.0  5.0 - 8.0   Glucose, UA NEGATIVE  NEGATIVE mg/dL   Hgb urine dipstick TRACE (*) NEGATIVE   Bilirubin Urine NEGATIVE  NEGATIVE   Ketones, ur NEGATIVE  NEGATIVE mg/dL   Protein, ur 30 (*) NEGATIVE mg/dL   Urobilinogen, UA 0.2  0.0 - 1.0 mg/dL   Nitrite NEGATIVE  NEGATIVE   Leukocytes, UA NEGATIVE  NEGATIVE  GLUCOSE, CAPILLARY     Status: Abnormal   Collection Time    08/06/14  8:09 AM      Result Value Ref Range   Glucose-Capillary 127 (*) 70 - 99 mg/dL  URINE MICROSCOPIC-ADD ON     Status:  None   Collection Time    08/06/14  8:09 AM       Result Value Ref Range   Squamous Epithelial / LPF RARE  RARE   WBC, UA 0-2  <3 WBC/hpf   RBC / HPF 0-2  <3 RBC/hpf   Bacteria, UA RARE  RARE  GLUCOSE, CAPILLARY     Status: Abnormal   Collection Time    08/06/14 12:17 PM      Result Value Ref Range   Glucose-Capillary 258 (*) 70 - 99 mg/dL    Signed: Jessee Avers, MD 08/06/2014, 2:17 PM    Services Ordered on Discharge: none Equipment Ordered on Discharge: none

## 2014-08-06 NOTE — Progress Notes (Signed)
I have seen the patient and reviewed the daily progress note by Lenise Arena MS IV and discussed the care of the patient with them.  Please see note for my findings, assessment, and plans/additions.

## 2014-08-06 NOTE — Progress Notes (Signed)
Subjective: Complaining of LLQ pain which has been present for 4 days. The pain is a constant, sharp and 5/10 severity. No nausea or vomiting. She has normal bowel movement.  Reports some dysuria at the end of urine stream without other urinary symptoms.  She received 2 doses of kayexalate which has improved her K level  Anemia panel indication IDA.  Objective: Vital signs in last 24 hours: Filed Vitals:   08/05/14 1352 08/05/14 2111 08/05/14 2113 08/06/14 0640  BP: 137/88 144/84 152/99 152/105  Pulse: 101 76 101 93  Temp: 98.6 F (37 C) 99.1 F (37.3 C) 98.4 F (36.9 C) 98.5 F (36.9 C)  TempSrc: Oral Oral Oral Oral  Resp: 16 15 16 14   Height:      Weight:      SpO2: 98% 98% 100% 97%   Weight change:   Intake/Output Summary (Last 24 hours) at 08/06/14 0854 Last data filed at 08/06/14 0700  Gross per 24 hour  Intake   2190 ml  Output    600 ml  Net   1590 ml    General: Vital signs reviewed. No distress. Mother at bedside. Lungs: Clear to auscultation bilaterally  Heart: RRR; no extra sounds or murmurs  Abdomen: abd tenderness in the LLQ without guarding or rebound. No hernia. No palpable masses. No CVA tenderness.  Bowel sounds present, soft, no hepatosplenomegaly. Rectal exam performed. No blood on examining finger. Send fobt card to lab Extremities: No bilateral ankle edema  Neurologic: Alert and oriented x3. Moves all extremities  Lab Results: Basic Metabolic Panel:  Recent Labs Lab 08/05/14 1832 08/06/14 0500  NA 139 140  K 4.8 4.8  CL 105 109  CO2 22 22  GLUCOSE 141* 123*  BUN 15 13  CREATININE 1.44* 1.53*  CALCIUM 9.0 8.7   Liver Function Tests:  Recent Labs Lab 08/04/14 1622  AST 23  ALT 13  ALKPHOS 92  BILITOT 0.3  PROT 7.2  ALBUMIN 3.6   CBC:  Recent Labs Lab 08/05/14 0609 08/06/14 0500  WBC 5.7 4.5  HGB 7.8* 7.3*  HCT 24.1* 23.2*  MCV 86.7 88.9  PLT 154 145*   Cardiac Enzymes:  Recent Labs Lab 08/04/14 2313  TROPONINI  <0.30   CBG:  Recent Labs Lab 08/05/14 1148 08/05/14 1646 08/05/14 1955 08/06/14 0006 08/06/14 0402 08/06/14 0809  GLUCAP 223* 151* 125* 93 173* 127*   Hemoglobin A1C:  Recent Labs Lab 08/04/14 2101  HGBA1C 9.9*   Thyroid Function Tests:  Recent Labs Lab 08/05/14 0609  TSH 6.510*  FREET4 0.89   Urine Drug Screen: Drugs of Abuse     Component Value Date/Time   LABOPIA NONE DETECTED 08/05/2014 0744   COCAINSCRNUR NONE DETECTED 08/05/2014 0744   LABBENZ NONE DETECTED 08/05/2014 0744   AMPHETMU NONE DETECTED 08/05/2014 0744   THCU NONE DETECTED 08/05/2014 0744   LABBARB NONE DETECTED 08/05/2014 0744    Urinalysis:  Recent Labs Lab 08/04/14 1639  COLORURINE YELLOW  LABSPEC 1.010  PHURINE 7.5  GLUCOSEU NEGATIVE  HGBUR NEGATIVE  BILIRUBINUR NEGATIVE  KETONESUR NEGATIVE  PROTEINUR 30*  UROBILINOGEN 0.2  NITRITE NEGATIVE  LEUKOCYTESUR NEGATIVE    Micro Results: No results found for this or any previous visit (from the past 240 hour(s)). Studies/Results: Dg Chest 2 View  08/04/2014   CLINICAL DATA:  Left-sided chest pain.  EXAM: CHEST  2 VIEW  COMPARISON:  01/07/2013.  FINDINGS: The heart size and mediastinal contours are within normal limits. Both lungs are  clear. The visualized skeletal structures are unremarkable.  IMPRESSION: Normal chest x-ray.   Electronically Signed   By: Kalman Jewels M.D.   On: 08/04/2014 23:12   Medications: I have reviewed the patient's current medications. Scheduled Meds: . amLODipine  5 mg Oral Daily  . ferrous sulfate  325 mg Oral TID WC  . heparin  5,000 Units Subcutaneous 3 times per day  . insulin aspart  0-9 Units Subcutaneous 6 times per day  . insulin glargine  10 Units Subcutaneous Daily  . levothyroxine  100 mcg Oral QAC breakfast  . metoCLOPramide  5 mg Oral TID AC  . 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 Related to ACE inhibitors in the setting of poor by mouth intake, and acute on chronic renal injury. Initially has peaked t waves on ekg with K level peaking at 7.1. She received calcium gluconate, insulin, and sodium bicarbonate on presentation.  Plan - received 2 doses of Kayexalate 30 g - K level stable at 4.8  - Hold Lisinopril  - d/c IVF  - cont with SSI for DM - d/c tele  Gastroparesis/GERD: Improved. Symptoms include early satiety, vomiting, bloating and abdominal pain.EGD in May 2009 >> subepithelial hemorrhage in the stomach and acute gastritis. She has been off Reglan which had been started at that time.  Plan. -Will continue with by mouth Reglan 5 mg 3 times a day 15 minutes before meals.  - keep current dose since symptoms are better - allow carb mod diet - monitor closely for side effects of Reglan like anxiety, restlessness, and depression, hyperprolactinemia, and QT interval prolongation and extrapyramidal side effects. - if symptoms persist, she might need gastric emptying study  T1DM with hypoglycemia: Presented with hypoglycemia of 51 which has since improved. A1c of 9.9%. CBG is better overnight. The main issue has been medication noncompliance due to cost. She has no glucometer for 1 year. Plan -carb modified diet. -SSI -- sensitive. -Can add long-acting insulin if CBG is elevated. - will arrange for close outpatient follow with [facility] or the [facility] in a few  days - will talk to case management for diabetic supplies and insulin  - She will also meet with the financial counselor for orange card application at [facility] per case management.  LLQ pain: ? Etiology. Urinalysis was normal on presentation. She is tender in the LLQ. Nephrolithiasis unlikely without hematuria. Pregnancy test was neg. IBD or IBS can not be excluded as this time but her symptoms are of short duration which makes these less likely.  Plan - repeat urinalysis  - abdominal Ultrasound scan to rule left tubo-ovarian pathology >> normal  - tylenol for pain relief - urine GC/chlamydia to rule out STI - no indication for abdominal CT scan at this time  Anemia secondary to CKD3 and Iron Def: Baseline hemoglobin between 10 and 11 g/dL. Hgb has continued to trend down 10.2 >>7.3. She received some IV fluids, and this  could be due to hemodilution. Anemia panel obtained which shows a component of iron def. Vitamin b12 is low normal.  Recent Labs  08/05/14 0609  VITAMINB12 241  FOLATE 10.0  FERRITIN 34  TIBC 269  IRON 38*  RETICCTPCT 1.5   Plan  - FOBT negative  - supplement Vitamin b12 at 247mcg daily for 2 weeks  - start po iron supplementation  - symptomatic for now - no blood transfusion indicated.  - follow up with renal as outpatient for consideration of ESAs  Hypothyroidism: Questionable compliance with Synthroid. TSH is better from 28.5 in March 2015 to 6.5 on 08/05/2014 Plan  - cont with current dose of Synthroid and consider dose adjustment as outpatient  AKI on CKD 3: Baseline creatinine of around 1.7. On presentation it had increased to 2.0. Slight improvement with overnight rehydration. AKI likely related to poor by mouth intake and use of lisinopril. Plan  - Monitor with BMP - Outpatient nephrology followup  Hypertension: elevated today 150/100. On home lisinopril.  Plan  Start amlodipine 5 mg daily. May increase dose if needed  Recheck BP and  discharge if stable No lisinopril due to elevated potassium.  FEN:  -carb mod - d/c IVF  DVT ppx: subq hep 5000u TID   Dispo: Disposition is deferred at this time, awaiting improvement of current medical problems. Will be discharged today with close follow up as outpatient. Will wait for abdominal scan and urinalysis  The patient does have a current PCP (No Pcp Per Patient - Has appt with [facility]) and does need an [facility] hospital follow-up appointment after discharge.   The patient does not know have transportation limitations that hinder transportation to clinic appointments   LOS: 2 days   Jessee Avers PGY 3 - Internal Medicine Teaching Service Pager: [phone] 08/06/2014, 8:54 AM

## 2014-08-06 NOTE — Progress Notes (Signed)
NURSING PROGRESS NOTE  Mashona Funck [id number] Discharge Data: 08/06/2014 5:51 PM Attending Provider: Madilyn Fireman, MD PCP:No PCP Per Patient     [name] to be D/C'd Home per MD order.  Discussed with the patient the After Visit Summary and all questions fully answered. All IV's discontinued with no bleeding noted. All belongings returned to patient for patient to take home.   Last Vital Signs:  Blood pressure 132/88, pulse 103, temperature 98.7 F (37.1 C), temperature source Oral, resp. rate 16, height 5\' 5"  (1.651 m), weight 66.724 kg (147 lb 1.6 oz), last menstrual period 07/04/2014, SpO2 98.00%.  Discharge Medication List   Medication List    STOP taking these medications       BIOTIN PO     lisinopril 10 MG tablet  Commonly known as:  PRINIVIL,ZESTRIL      TAKE these medications       acetaminophen 325 MG tablet  Commonly known as:  TYLENOL  Take 2 tablets (650 mg total) by mouth every 6 (six) hours as needed for mild pain (or Fever >/= 101).     albuterol 108 (90 BASE) MCG/ACT inhaler  Commonly known as:  PROVENTIL HFA;VENTOLIN HFA  Inhale 1-2 puffs into the lungs every 6 (six) hours as needed for wheezing or shortness of breath.     amLODipine 10 MG tablet  Commonly known as:  NORVASC  Take 0.5 tablets (5 mg total) by mouth daily.     DSS 100 MG Caps  Take 100 mg by mouth daily as needed.     ferrous sulfate 325 (65 FE) MG tablet  Take 1 tablet (325 mg total) by mouth 3 (three) times daily with meals.     fexofenadine 180 MG tablet  Commonly known as:  ALLEGRA  Take 1 tablet (180 mg total) by mouth daily as needed for allergies or rhinitis.     insulin aspart 100 UNIT/ML injection  Commonly known as:  novoLOG  Inject 1-5 Units into the skin 3 (three) times daily with meals. Give 1 unit per 15 grams of carbohydrates with goal CBG 150     insulin glargine 100 UNIT/ML injection  Commonly known as:  LANTUS  Inject 0.24 mLs (24 Units total) into  the skin at bedtime.     levothyroxine 100 MCG tablet  Commonly known as:  SYNTHROID, LEVOTHROID  Take 1 tablet (100 mcg total) by mouth daily before breakfast.     metoCLOPramide 5 MG tablet  Commonly known as:  REGLAN  Take 1 tablet (5 mg total) by mouth 3 (three) times daily before meals.     pravastatin 20 MG tablet  Commonly known as:  PRAVACHOL  Take 20 mg by mouth every evening.     vitamin B-12 250 MCG tablet  Commonly known as:  CYANOCOBALAMIN  Take 1 tablet (250 mcg total) by mouth daily.         Wallie Renshaw, RN

## 2014-08-06 NOTE — Discharge Instructions (Signed)
·   Thank you for allowing us to be involved in your healthcare while you were hospitalized at [facility].  °· Please note that there have been changes to your home medications.  --> PLEASE LOOK AT YOUR DISCHARGE MEDICATION LIST FOR DETAILS. ° °· Please call your PCP if you have any questions or concerns, or any difficulty getting any of your medications. °· Please return to the ER if you have worsening of your symptoms or new severe symptoms arise. °·  °

## 2014-08-06 NOTE — Care Management Note (Signed)
    Page 1 of 1   08/06/2014     3:01:41 PM CARE MANAGEMENT NOTE 08/06/2014  Patient:  Angela [name], Angela [name]   Account Number:  [id number]  Date Initiated:  08/05/2014  Documentation initiated by:  [facility]  Subjective/Objective Assessment:   Type I Diabetes     Action/Plan:   Anticipated DC Date:  08/06/2014   Anticipated DC Plan:  [place]  CM consult  Medication Assistance  [facility]      Choice offered to / List presented to:             Status of service:  Completed, signed off Medicare Important Message given?   (If response is "NO", the following Medicare IM given date fields will be blank) Date Medicare IM given:   Medicare IM given by:   Date Additional Medicare IM given:   Additional Medicare IM given by:    Discharge Disposition:  HOME/SELF CARE  Per UR Regulation:  Reviewed for med. necessity/level of care/duration of stay  If discussed at [place] of Stay Meetings, dates discussed:    Comments:  08/05/2014 1240 NCM spoke to pt and states she has Lantus and Novolog pens at home. Pt has [employer] brochure with appt for 8/24 at 9 am. Explained to pt that [facility] will assist her with applying for Lantus PAP for 1 year free medication. States she was on the program a few years ago. Spoke to [facility] pharmacist and they have [place] free at clinic. After she sees the [facility] physician they will assist her with obtaining insulin through the clinic. Jonnie Finner RN CCM Case Mgmt phone [phone]

## 2014-08-07 LAB — GC/CHLAMYDIA PROBE AMP
CT Probe RNA: NEGATIVE
GC PROBE AMP APTIMA: NEGATIVE

## 2014-08-08 ENCOUNTER — Encounter: Payer: Self-pay | Admitting: Internal Medicine

## 2014-08-08 NOTE — Progress Notes (Signed)
I have seen the patient and reviewed the daily progress note by Lenise Arena MS IV and discussed the care of the patient with them.  Please see note for my findings, assessment, and plans/additions.

## 2014-08-11 ENCOUNTER — Ambulatory Visit: Payer: Self-pay | Attending: Family Medicine | Admitting: Family Medicine

## 2014-08-11 ENCOUNTER — Encounter: Payer: Self-pay | Admitting: Family Medicine

## 2014-08-11 ENCOUNTER — Ambulatory Visit: Payer: Self-pay | Attending: Internal Medicine

## 2014-08-11 VITALS — BP 129/87 | HR 97 | Temp 98.3°F | Resp 16 | Ht 65.0 in | Wt 149.0 lb

## 2014-08-11 DIAGNOSIS — E109 Type 1 diabetes mellitus without complications: Secondary | ICD-10-CM

## 2014-08-11 DIAGNOSIS — N039 Chronic nephritic syndrome with unspecified morphologic changes: Secondary | ICD-10-CM

## 2014-08-11 DIAGNOSIS — Z794 Long term (current) use of insulin: Secondary | ICD-10-CM | POA: Insufficient documentation

## 2014-08-11 DIAGNOSIS — N183 Chronic kidney disease, stage 3 unspecified: Secondary | ICD-10-CM

## 2014-08-11 DIAGNOSIS — E039 Hypothyroidism, unspecified: Secondary | ICD-10-CM | POA: Insufficient documentation

## 2014-08-11 DIAGNOSIS — N189 Chronic kidney disease, unspecified: Secondary | ICD-10-CM | POA: Insufficient documentation

## 2014-08-11 DIAGNOSIS — R87610 Atypical squamous cells of undetermined significance on cytologic smear of cervix (ASC-US): Secondary | ICD-10-CM

## 2014-08-11 DIAGNOSIS — E1059 Type 1 diabetes mellitus with other circulatory complications: Secondary | ICD-10-CM

## 2014-08-11 DIAGNOSIS — D631 Anemia in chronic kidney disease: Secondary | ICD-10-CM

## 2014-08-11 DIAGNOSIS — E875 Hyperkalemia: Secondary | ICD-10-CM

## 2014-08-11 DIAGNOSIS — N1831 Chronic kidney disease, stage 3a: Secondary | ICD-10-CM

## 2014-08-11 DIAGNOSIS — E890 Postprocedural hypothyroidism: Secondary | ICD-10-CM

## 2014-08-11 DIAGNOSIS — I129 Hypertensive chronic kidney disease with stage 1 through stage 4 chronic kidney disease, or unspecified chronic kidney disease: Secondary | ICD-10-CM | POA: Insufficient documentation

## 2014-08-11 LAB — CBC
HCT: 27.3 % — ABNORMAL LOW (ref 36.0–46.0)
Hemoglobin: 8.6 g/dL — ABNORMAL LOW (ref 12.0–15.0)
MCH: 27.3 pg (ref 26.0–34.0)
MCHC: 31.5 g/dL (ref 30.0–36.0)
MCV: 86.7 fL (ref 78.0–100.0)
PLATELETS: 242 10*3/uL (ref 150–400)
RBC: 3.15 MIL/uL — ABNORMAL LOW (ref 3.87–5.11)
RDW: 14 % (ref 11.5–15.5)
WBC: 4.5 10*3/uL (ref 4.0–10.5)

## 2014-08-11 LAB — GLUCOSE, POCT (MANUAL RESULT ENTRY): POC Glucose: 113 mg/dl — AB (ref 70–99)

## 2014-08-11 MED ORDER — AMITRIPTYLINE HCL 25 MG PO TABS: 25.0000 mg | ORAL_TABLET | Freq: Every day | ORAL | Status: DC

## 2014-08-11 NOTE — Assessment & Plan Note (Signed)
A: thyroid feels palpable on today's exam. I do not see a history of ablation. Will clarify with patient at f/u. Lab Results  Component Value Date   TSH 6.510* 08/05/2014   P: recheck in 8-12 weeks from 08/05/14, patient was acutely ill at time of check.

## 2014-08-11 NOTE — Assessment & Plan Note (Signed)
A: noted on 2013 pap smear. P: repeat pap smear in 2015.

## 2014-08-11 NOTE — Patient Instructions (Signed)
Angela [name],  Thank you for coming in today.  New medicine: elavil 25 mg at night for neuropathy.  Please schedule an appointment at the [facility] for retinal scan.  F/u with me in 4-6 weeks for repeat pap smear, abnormal pap in 2013 ASCUS.  We will repeat your TSH and CBC then.  Lab Results  Component Value Date   TSH 6.510* 08/05/2014   Lab Results  Component Value Date   HGB 7.3* 08/06/2014     I will be in touch with lab results.  Call for: swelling, persistently low or high sugars.  Dr. Adrian Blackwater  Beware of hypoglycemia which is blood sugar less than 70 with or without symptoms.  The common symptoms of hypoglycemia are: sweating, pale or dusty skin, excessive fatigue, nausea, jitteriness. If you experience these symptoms please check your blood sugar.  My blood sugar is low  (less than 70). What should I do?  If low 60- 70, with or without symptoms. Do not take insulin or oral medication,  eat or drink carbohydrates right away (juice, sweets, breads, fruit). Recheck blood sugar in 2 hrs. If still low call your doctor. If normal take medication.   If 60-40 without symptoms.  Same as above and call your doctor.   If 60-40 with symptoms. Same as above. If symptoms resolve within 30 minutes of eating or drinking carbohydrates call your doctor. If symptoms persist call 911.   If less than 40 with or without symptoms. Same as above. Do not wait 30 minutes, instead call 911.

## 2014-08-11 NOTE — Progress Notes (Signed)
Pt is here today to establish care. Pt is here to manage her diabetes. Pt reports that for 2 days she having pain in her legs and feet.

## 2014-08-11 NOTE — Progress Notes (Signed)
   Subjective:    Patient ID: Angela [name], female    DOB: [birth year], 28 y.o.   MRN: [id number] CC: establish care,   HPI 28 year old female with history of insulin-dependent type 1 diabetes, chronic kidney disease and hypothyroidism presents to establish care and discuss the following:  #1 IDDM type 1:  Patient was diagnosed with diabetes at age 41. She's been on insulin. 2 strong family history diabetes. Consultations include diabetic nephropathy, diabetic neuropathy, decreased vision and anemia of chronic disease. Patient was recently hospital for hyperglycemia and hyperkalemia. She had no swelling at that time. However over the past today she is noted looks to me edema. She treats with elevation. She has chronic fatigue. She does have history of hypoglycemia. 4 days ago her blood sugar was 40 at 4 AM. This woke her from her sleep is seen was able to eat to bring her sugar up. She's compliant with Levemir 24 units at night. Compliant with a NovoLog sliding scale.   #2 anemia due to CKD and iron deficiency: Patient is compliant with iron. She admits to fatigue. She is a history of scant blood on bowel movements. This last occurred 4 days ago. She denies recurrence. She denies heavy menstrual bleeding. LMP 07/05/2014.  #3 hyperkalemia: Patient was hyperkalemic during her last hospitalization with a potassium up to 7.1 creatinine peaked at 2.   #4 ASCUS: Patient history of ascus Pap and 2013. I see no followup Pap smear or colposcopy. Patient is homosexual. She denies recent sex partners.   Review of Systems  as per history of present illness     Objective:   Physical Exam BP 129/87  Pulse 97  Temp(Src) 98.3 F (36.8 C) (Oral)  Resp 16  Ht 5\' 5"  (1.651 m)  Wt 149 lb (67.586 kg)  BMI 24.79 kg/m2  SpO2 100%  LMP 07/04/2014 General appearance: alert, cooperative and no distress Eyes: conjunctivae/corneas clear. PERRL, EOM's intact.  Ears: normal TM's and external ear canals both  ears Nose: Nares normal. Septum midline. Mucosa normal. No drainage or sinus tenderness. Throat: lips, mucosa, and tongue normal; teeth and gums normal Neck: no adenopathy, supple, symmetrical, trachea midline and thyroid: enlarged and symmetrical, nontender  Lungs: clear to auscultation bilaterally Heart: regular rate and rhythm, S1, S2 normal, no murmur, click, rub or gallop Abdomen: soft, non-tender; bowel sounds normal; no masses,  no organomegaly Extremities: edema trace - 1+ b/l LE     Assessment & Plan:

## 2014-08-11 NOTE — Assessment & Plan Note (Signed)
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 P: Blood sugar control Avoid nephrotoxins.  Patient may benefit from ACEi to slow progression of disease, given recent hyperkalemia I will consult renal first.  Checking BMP today, will monitor weight and edema. Treat with lasix prn if edema acutely worsens.

## 2014-08-11 NOTE — Assessment & Plan Note (Signed)
A: in the setting of acute on chronic CKD. Treated, resolved upon hospital d/c. P: Repeat BMP Consider lasix as a treatment option if symptom recur.  Placing referral to nephology

## 2014-08-11 NOTE — Assessment & Plan Note (Signed)
Repeating CBC today. Patient on daily iron. May require aranesp. Deferred rectal for FOBT.  Will return for f/u pap and will do rectal then. Place referral to nephology for ? aranesp therapy.

## 2014-08-11 NOTE — Assessment & Plan Note (Addendum)
A: blood sugar control improving with A1c downtrending. Patient with CKD, anemia, neuropahty.  P: Continue current insulin regimen Restart elavil 25 mg daily for neuropathy Referral to renal.   Addendum: decreased levemir dose to 20 U q HS given patient hypoglycemic on recent BMP. She was asymptomatic.

## 2014-08-12 LAB — BASIC METABOLIC PANEL
BUN: 17 mg/dL (ref 6–23)
CALCIUM: 10.4 mg/dL (ref 8.4–10.5)
CHLORIDE: 104 meq/L (ref 96–112)
CO2: 25 meq/L (ref 19–32)
CREATININE: 1.53 mg/dL — AB (ref 0.50–1.10)
GLUCOSE: 45 mg/dL — AB (ref 70–99)
Potassium: 4.7 mEq/L (ref 3.5–5.3)
Sodium: 138 mEq/L (ref 135–145)

## 2014-08-12 MED ORDER — INSULIN DETEMIR 100 UNIT/ML ~~LOC~~ SOLN: 20.0000 [IU] | Freq: Every day | SUBCUTANEOUS | Status: DC

## 2014-08-12 NOTE — Addendum Note (Signed)
Addended by: Boykin Nearing on: 08/12/2014 02:22 PM   Modules accepted: Orders

## 2014-08-18 ENCOUNTER — Telehealth: Payer: Self-pay | Admitting: *Deleted

## 2014-08-18 NOTE — Telephone Encounter (Signed)
Pt is aware of her lab results. Pt is also aware that she needs to decrease her insulin.

## 2014-08-18 NOTE — Telephone Encounter (Signed)
Message copied by Joan Mayans on Mon Aug 18, 2014  3:52 PM ------      Message from: Boykin Nearing      Created: Tue Aug 12, 2014  2:20 PM       Please inform patient.      Hgb on the rise to 8.6, good news, continue iron.      Cr stable.       Blood sugar was 43, patient was asymptomatic, please decrease levemir to 20 U at night.       Potassium normal. ------

## 2014-08-22 ENCOUNTER — Other Ambulatory Visit: Payer: Self-pay | Admitting: Internal Medicine

## 2014-08-22 DIAGNOSIS — E1059 Type 1 diabetes mellitus with other circulatory complications: Secondary | ICD-10-CM

## 2014-08-22 MED ORDER — INSULIN ASPART 100 UNIT/ML ~~LOC~~ SOLN: 1.0000 [IU] | Freq: Three times a day (TID) | SUBCUTANEOUS | Status: DC

## 2014-08-22 MED ORDER — INSULIN DETEMIR 100 UNIT/ML ~~LOC~~ SOLN: 20.0000 [IU] | Freq: Every day | SUBCUTANEOUS | Status: DC

## 2014-08-22 MED ORDER — LEVOTHYROXINE SODIUM 100 MCG PO TABS: 100.0000 ug | ORAL_TABLET | Freq: Every day | ORAL | Status: DC

## 2014-09-11 ENCOUNTER — Ambulatory Visit: Payer: Self-pay | Admitting: Internal Medicine

## 2014-09-17 ENCOUNTER — Other Ambulatory Visit (HOSPITAL_COMMUNITY): Payer: Self-pay | Admitting: Internal Medicine

## 2014-09-23 ENCOUNTER — Encounter: Payer: Self-pay | Admitting: Family Medicine

## 2014-09-24 ENCOUNTER — Encounter: Payer: Self-pay | Admitting: *Deleted

## 2014-09-30 ENCOUNTER — Encounter: Payer: Self-pay | Admitting: Family Medicine

## 2014-09-30 ENCOUNTER — Other Ambulatory Visit: Payer: Self-pay

## 2014-09-30 ENCOUNTER — Ambulatory Visit: Payer: Self-pay | Attending: Family Medicine | Admitting: Family Medicine

## 2014-09-30 VITALS — BP 144/94 | HR 97 | Temp 99.1°F | Resp 18 | Wt 149.2 lb

## 2014-09-30 DIAGNOSIS — I129 Hypertensive chronic kidney disease with stage 1 through stage 4 chronic kidney disease, or unspecified chronic kidney disease: Secondary | ICD-10-CM | POA: Insufficient documentation

## 2014-09-30 DIAGNOSIS — E11319 Type 2 diabetes mellitus with unspecified diabetic retinopathy without macular edema: Secondary | ICD-10-CM | POA: Insufficient documentation

## 2014-09-30 DIAGNOSIS — A499 Bacterial infection, unspecified: Secondary | ICD-10-CM

## 2014-09-30 DIAGNOSIS — Z124 Encounter for screening for malignant neoplasm of cervix: Secondary | ICD-10-CM | POA: Insufficient documentation

## 2014-09-30 DIAGNOSIS — N760 Acute vaginitis: Secondary | ICD-10-CM | POA: Insufficient documentation

## 2014-09-30 DIAGNOSIS — N1831 Chronic kidney disease, stage 3a: Secondary | ICD-10-CM

## 2014-09-30 DIAGNOSIS — R3129 Other microscopic hematuria: Secondary | ICD-10-CM | POA: Insufficient documentation

## 2014-09-30 DIAGNOSIS — I10 Essential (primary) hypertension: Secondary | ICD-10-CM

## 2014-09-30 DIAGNOSIS — E109 Type 1 diabetes mellitus without complications: Secondary | ICD-10-CM | POA: Insufficient documentation

## 2014-09-30 DIAGNOSIS — B9689 Other specified bacterial agents as the cause of diseases classified elsewhere: Secondary | ICD-10-CM | POA: Insufficient documentation

## 2014-09-30 DIAGNOSIS — R87610 Atypical squamous cells of undetermined significance on cytologic smear of cervix (ASC-US): Secondary | ICD-10-CM | POA: Insufficient documentation

## 2014-09-30 DIAGNOSIS — N183 Chronic kidney disease, stage 3 (moderate): Secondary | ICD-10-CM | POA: Insufficient documentation

## 2014-09-30 DIAGNOSIS — Z9189 Other specified personal risk factors, not elsewhere classified: Secondary | ICD-10-CM | POA: Insufficient documentation

## 2014-09-30 DIAGNOSIS — J45909 Unspecified asthma, uncomplicated: Secondary | ICD-10-CM | POA: Insufficient documentation

## 2014-09-30 MED ORDER — AMLODIPINE BESYLATE 10 MG PO TABS: 5.0000 mg | ORAL_TABLET | Freq: Every day | ORAL | Status: DC

## 2014-09-30 NOTE — Assessment & Plan Note (Signed)
A: BP elevated P: Continue norvasc Patient starting losartan 25 F/u RN BP check and Cr in two weeks If BP elevated increase norvasc to 10 Goal BP < 130/90

## 2014-09-30 NOTE — Progress Notes (Signed)
ANNUAL PAP  F/U Blood test

## 2014-09-30 NOTE — Assessment & Plan Note (Signed)
Pap done today  

## 2014-09-30 NOTE — Patient Instructions (Addendum)
Ms. Yost,  Thank you for coming in today. You will be called with pap results.  Start losartan, ARB/BP medication also helps prevent progression of chronic kidney disease. However we do have to monitor potassium and Cr when you take this medicine.   Return in two week for lab visit BMP to check potassium and creatine. Also do a nurse visit for BP check.  F/u with me in 4-5  Weeks, diabetes f/u repeat A1c.   Dr. Adrian Blackwater

## 2014-09-30 NOTE — Progress Notes (Signed)
   Subjective:    Patient ID: Angela [name], female    DOB: [birth year], 28 y.o.   MRN: [id number] CC: f/u for pap smear  HPI 28 yo F presents for pap smear.   1. Pap: She is not sexually active with men. No vaginal discharge or abnormal bleeding. Had ASCUS pap first noted in 10/2006. Had colpo with biopsy. 08/2007 pap normal. 04/2009 pap ASCUS. 08/2012 pap ASCUS.  Recommend repeat pap in one year, patient did not f/u.   2. CKD: established with nephrologist at [facility] today. Recommendation was to restart ARB for HTN, CKD, DM1.  Queried care everywhere, patient had blood work done: Cr today is 1.9. K+ today is 4.6. BP was 148/104    Review of Systems As per HPI     Objective:   Physical Exam BP 144/94  Pulse 97  Temp(Src) 99.1 F (37.3 C) (Oral)  Resp 18  Wt 149 lb 3.2 oz (67.677 kg)  SpO2 98%  LMP 09/15/2014 BP Readings from Last 3 Encounters:  09/30/14 144/94  08/11/14 129/87  08/06/14 132/88   Wt Readings from Last 3 Encounters:  09/30/14 149 lb 3.2 oz (67.677 kg)  08/11/14 149 lb (67.586 kg)  08/04/14 147 lb 1.6 oz (66.724 kg)  bGeneral appearance: alert, cooperative and no distress Pelvic: cervix normal in appearance, external genitalia normal, no adnexal masses or tenderness, no cervical motion tenderness, positive findings: vaginal discharge:  scant, white, thin and malodorous, rectovaginal septum normal and uterus normal size, shape, and consistency      Assessment & Plan:

## 2014-09-30 NOTE — Assessment & Plan Note (Addendum)
Pap done today Pap wnl, repeat in 3 years

## 2014-09-30 NOTE — Assessment & Plan Note (Addendum)
A: patient was seen today by renal. Prescribed losartan, Cr. Up to 1.9, K stable at 4.6 P: F/u BMP and RN BP check in two weeks  F/u with me in 6 weeks

## 2014-10-01 LAB — CERVICOVAGINAL ANCILLARY ONLY
Chlamydia: NEGATIVE
Neisseria Gonorrhea: NEGATIVE
Wet Prep (BD Affirm): NEGATIVE
Wet Prep (BD Affirm): NEGATIVE
Wet Prep (BD Affirm): POSITIVE — AB

## 2014-10-02 LAB — CYTOLOGY - PAP

## 2014-10-07 ENCOUNTER — Telehealth: Payer: Self-pay | Admitting: *Deleted

## 2014-10-07 DIAGNOSIS — N760 Acute vaginitis: Secondary | ICD-10-CM

## 2014-10-07 DIAGNOSIS — B9689 Other specified bacterial agents as the cause of diseases classified elsewhere: Secondary | ICD-10-CM | POA: Insufficient documentation

## 2014-10-07 MED ORDER — METRONIDAZOLE 500 MG PO TABS: 500.0000 mg | ORAL_TABLET | Freq: Two times a day (BID) | ORAL | Status: DC

## 2014-10-07 MED ORDER — FLUCONAZOLE 150 MG PO TABS: 150.0000 mg | ORAL_TABLET | Freq: Once | ORAL | Status: DC

## 2014-10-07 NOTE — Telephone Encounter (Signed)
Left voicemail to return call. 

## 2014-10-07 NOTE — Telephone Encounter (Signed)
Message copied by Betti Cruz on Tue Oct 07, 2014  2:19 PM ------      Message from: Boykin Nearing      Created: Tue Oct 07, 2014 11:37 AM       Normal pap repeat in 3 years.      CG/chlam negative      BV on wet prep sent in flagyl to be followed by diflucan ------

## 2014-10-07 NOTE — Assessment & Plan Note (Signed)
BV on wet prep sent in flagyl to be followed by diflucan

## 2014-10-07 NOTE — Addendum Note (Signed)
Addended by: Boykin Nearing on: 10/07/2014 11:38 AM   Modules accepted: Orders

## 2014-10-14 ENCOUNTER — Ambulatory Visit (HOSPITAL_COMMUNITY)
Admission: RE | Admit: 2014-10-14 | Discharge: 2014-10-14 | Disposition: A | Discharge status: Home / Self Care | Payer: Self-pay | Source: Ambulatory Visit | Attending: Family Medicine | Admitting: Family Medicine

## 2014-10-14 ENCOUNTER — Ambulatory Visit: Payer: Self-pay

## 2014-10-14 ENCOUNTER — Other Ambulatory Visit: Payer: Self-pay

## 2014-10-14 ENCOUNTER — Ambulatory Visit: Payer: Self-pay | Attending: Family Medicine | Admitting: *Deleted

## 2014-10-14 VITALS — BP 148/100 | HR 93 | Temp 98.1°F

## 2014-10-14 DIAGNOSIS — N183 Chronic kidney disease, stage 3 (moderate): Principal | ICD-10-CM

## 2014-10-14 DIAGNOSIS — I10 Essential (primary) hypertension: Secondary | ICD-10-CM | POA: Insufficient documentation

## 2014-10-14 DIAGNOSIS — N1831 Chronic kidney disease, stage 3a: Secondary | ICD-10-CM

## 2014-10-14 LAB — BASIC METABOLIC PANEL
BUN: 33 mg/dL — AB (ref 6–23)
CHLORIDE: 103 meq/L (ref 96–112)
CO2: 20 mEq/L (ref 19–32)
Calcium: 9 mg/dL (ref 8.4–10.5)
Creat: 1.99 mg/dL — ABNORMAL HIGH (ref 0.50–1.10)
GLUCOSE: 161 mg/dL — AB (ref 70–99)
POTASSIUM: 5.5 meq/L — AB (ref 3.5–5.3)
SODIUM: 135 meq/L (ref 135–145)

## 2014-10-14 MED ORDER — CLONIDINE HCL 0.1 MG PO TABS
0.1000 mg | ORAL_TABLET | Freq: Once | ORAL | Status: AC
  Administered 2014-10-14: 0.1 mg via ORAL

## 2014-10-14 NOTE — Progress Notes (Signed)
Patient ID: Angela [name], female   DOB: Oct 23, 1986, 28 y.o.   MRN: [id number]  Patient presents for BP check States taking losartan as directed. States stopped taking amlodipine 2 weeks ago when started losartan  BP 152/104 verified by manual reading P 98 SPO2 100% T 98.1 oral  Denies Headache C/o left eye feeling "stiff" this AM Also states "pressure" in chest for 2 days  ECG done Clonidine 0.1 mg given per protocol at 1025. PCP aware  VS 30 minutes after taking clonidine 0.1 mg:  BP 148/100 left arm manually P 93 SPO2 100% States left eye no longer feeling "stiff" States pressure in chest feels the same Patient states she is heading to work after this appt Instructed to take amlodipine 10 mg before going to work today per PCP  Per PCP, patient instructed to restart amlodipine at 10 mg daily in addition to losartan Patient will return in 2 weeks for nurse visit/BP check  Patient given clear instructions to go directly to ED if chest pressure increases or becomes painful

## 2014-10-15 ENCOUNTER — Telehealth: Payer: Self-pay | Admitting: Family Medicine

## 2014-10-15 DIAGNOSIS — I10 Essential (primary) hypertension: Secondary | ICD-10-CM

## 2014-10-15 DIAGNOSIS — N183 Chronic kidney disease, stage 3 (moderate): Principal | ICD-10-CM

## 2014-10-15 DIAGNOSIS — N1831 Chronic kidney disease, stage 3a: Secondary | ICD-10-CM

## 2014-10-15 MED ORDER — FUROSEMIDE 40 MG PO TABS: 40.0000 mg | ORAL_TABLET | Freq: Every day | ORAL | Status: DC

## 2014-10-15 NOTE — Assessment & Plan Note (Signed)
A: elevated BP rise in K+ and Cr P: Stop losartan Add lasix 40 mg daily  Continue norvasc 10 daily  F/u in one week for RN BP check and BMP

## 2014-10-15 NOTE — Assessment & Plan Note (Signed)
A: patient with CKD in the setting of IDDM type 1 and HTN, elevated Cr and K+ on recent BMP P: D/c cozaar Start lasix 40 mg daily  Continue amlodipine 10 mg daily  F/u BMP in one week and f/u BP check

## 2014-10-15 NOTE — Telephone Encounter (Signed)
A: elevated BP rise in K+ and Cr P: Stop losartan Add lasix 40 mg daily  Continue norvasc 10 daily  F/u in one week for RN BP check and BMP

## 2014-10-21 ENCOUNTER — Telehealth: Payer: Self-pay | Admitting: Emergency Medicine

## 2014-10-21 NOTE — Telephone Encounter (Signed)
Left message for pt to call as soon as message received regarding lab results Medication updated in pt chart

## 2014-10-21 NOTE — Telephone Encounter (Signed)
Pt given lab results with instructions to stop taking Losartan but continue Norvasc. Instructed to return next week for BP nurse visit with repeat BMP Pt verbalized understanding. States she is experiencing slight SOB Instructed to pick medication Lasix 40 mg tab @ [facility]

## 2014-10-21 NOTE — Telephone Encounter (Signed)
Left message on both numbers listed to call when received. I will attempt to call again

## 2014-10-22 ENCOUNTER — Encounter (HOSPITAL_COMMUNITY): Payer: Self-pay | Admitting: Emergency Medicine

## 2014-10-22 ENCOUNTER — Emergency Department (HOSPITAL_COMMUNITY)
Admission: EM | Admit: 2014-10-22 | Discharge: 2014-10-22 | Disposition: A | Discharge status: Home / Self Care | Payer: Self-pay | Attending: Emergency Medicine | Admitting: Emergency Medicine

## 2014-10-22 ENCOUNTER — Emergency Department (HOSPITAL_COMMUNITY): Payer: Self-pay

## 2014-10-22 DIAGNOSIS — E785 Hyperlipidemia, unspecified: Secondary | ICD-10-CM | POA: Insufficient documentation

## 2014-10-22 DIAGNOSIS — Z87891 Personal history of nicotine dependence: Secondary | ICD-10-CM | POA: Insufficient documentation

## 2014-10-22 DIAGNOSIS — Z3202 Encounter for pregnancy test, result negative: Secondary | ICD-10-CM | POA: Insufficient documentation

## 2014-10-22 DIAGNOSIS — J45901 Unspecified asthma with (acute) exacerbation: Secondary | ICD-10-CM | POA: Insufficient documentation

## 2014-10-22 DIAGNOSIS — R079 Chest pain, unspecified: Secondary | ICD-10-CM | POA: Insufficient documentation

## 2014-10-22 DIAGNOSIS — Z8669 Personal history of other diseases of the nervous system and sense organs: Secondary | ICD-10-CM | POA: Insufficient documentation

## 2014-10-22 DIAGNOSIS — I129 Hypertensive chronic kidney disease with stage 1 through stage 4 chronic kidney disease, or unspecified chronic kidney disease: Secondary | ICD-10-CM | POA: Insufficient documentation

## 2014-10-22 DIAGNOSIS — Z794 Long term (current) use of insulin: Secondary | ICD-10-CM | POA: Insufficient documentation

## 2014-10-22 DIAGNOSIS — N189 Chronic kidney disease, unspecified: Secondary | ICD-10-CM | POA: Insufficient documentation

## 2014-10-22 DIAGNOSIS — E039 Hypothyroidism, unspecified: Secondary | ICD-10-CM | POA: Insufficient documentation

## 2014-10-22 DIAGNOSIS — D649 Anemia, unspecified: Secondary | ICD-10-CM | POA: Insufficient documentation

## 2014-10-22 DIAGNOSIS — R52 Pain, unspecified: Secondary | ICD-10-CM

## 2014-10-22 DIAGNOSIS — Z8719 Personal history of other diseases of the digestive system: Secondary | ICD-10-CM | POA: Insufficient documentation

## 2014-10-22 DIAGNOSIS — Z79899 Other long term (current) drug therapy: Secondary | ICD-10-CM | POA: Insufficient documentation

## 2014-10-22 DIAGNOSIS — R42 Dizziness and giddiness: Secondary | ICD-10-CM | POA: Insufficient documentation

## 2014-10-22 LAB — I-STAT CHEM 8, ED
BUN: 45 mg/dL — ABNORMAL HIGH (ref 6–23)
Calcium, Ion: 1.34 mmol/L — ABNORMAL HIGH (ref 1.12–1.23)
Chloride: 104 mEq/L (ref 96–112)
Creatinine, Ser: 2.3 mg/dL — ABNORMAL HIGH (ref 0.50–1.10)
GLUCOSE: 179 mg/dL — AB (ref 70–99)
HCT: 32 % — ABNORMAL LOW (ref 36.0–46.0)
HEMOGLOBIN: 10.9 g/dL — AB (ref 12.0–15.0)
POTASSIUM: 4.9 meq/L (ref 3.7–5.3)
Sodium: 138 mEq/L (ref 137–147)
TCO2: 24 mmol/L (ref 0–100)

## 2014-10-22 LAB — URINALYSIS, ROUTINE W REFLEX MICROSCOPIC
BILIRUBIN URINE: NEGATIVE
GLUCOSE, UA: 500 mg/dL — AB
KETONES UR: NEGATIVE mg/dL
Leukocytes, UA: NEGATIVE
NITRITE: NEGATIVE
PH: 5.5 (ref 5.0–8.0)
Protein, ur: >300 mg/dL — AB
SPECIFIC GRAVITY, URINE: 1.014 (ref 1.005–1.030)
Urobilinogen, UA: 0.2 mg/dL (ref 0.0–1.0)

## 2014-10-22 LAB — CBC WITH DIFFERENTIAL/PLATELET
Basophils Absolute: 0 10*3/uL (ref 0.0–0.1)
Basophils Relative: 0 % (ref 0–1)
EOS PCT: 0 % (ref 0–5)
Eosinophils Absolute: 0 10*3/uL (ref 0.0–0.7)
HCT: 28.5 % — ABNORMAL LOW (ref 36.0–46.0)
Hemoglobin: 9.1 g/dL — ABNORMAL LOW (ref 12.0–15.0)
Lymphocytes Relative: 21 % (ref 12–46)
Lymphs Abs: 0.9 10*3/uL (ref 0.7–4.0)
MCH: 27.4 pg (ref 26.0–34.0)
MCHC: 31.9 g/dL (ref 30.0–36.0)
MCV: 85.8 fL (ref 78.0–100.0)
Monocytes Absolute: 0.3 10*3/uL (ref 0.1–1.0)
Monocytes Relative: 6 % (ref 3–12)
NEUTROS PCT: 73 % (ref 43–77)
Neutro Abs: 3.3 10*3/uL (ref 1.7–7.7)
PLATELETS: 272 10*3/uL (ref 150–400)
RBC: 3.32 MIL/uL — ABNORMAL LOW (ref 3.87–5.11)
RDW: 12.4 % (ref 11.5–15.5)
WBC: 4.6 10*3/uL (ref 4.0–10.5)

## 2014-10-22 LAB — POC URINE PREG, ED: Preg Test, Ur: NEGATIVE

## 2014-10-22 LAB — URINE MICROSCOPIC-ADD ON

## 2014-10-22 LAB — CBG MONITORING, ED: Glucose-Capillary: 179 mg/dL — ABNORMAL HIGH (ref 70–99)

## 2014-10-22 MED ORDER — SODIUM CHLORIDE 0.9 % IV BOLUS (SEPSIS)
1000.0000 mL | Freq: Once | INTRAVENOUS | Status: AC
  Administered 2014-10-22: 1000 mL via INTRAVENOUS

## 2014-10-22 MED ORDER — MORPHINE SULFATE 4 MG/ML IJ SOLN
4.0000 mg | Freq: Once | INTRAMUSCULAR | Status: AC
  Administered 2014-10-22: 4 mg via INTRAVENOUS
  Filled 2014-10-22: qty 1
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
## 2014-10-22 NOTE — ED Notes (Signed)
XRAY in room at this time 

## 2014-10-22 NOTE — ED Notes (Signed)
Pt still receiving fluid bolus.

## 2014-10-22 NOTE — ED Notes (Signed)
Per pt, she went to work today and began feeling heavy in the chest, pt reports vomiting x2, pt SOB at this time and reports chest pain in the center of her chest 4/10. Pt states she went to her kidney doctor last Tuesday and her labs showed high K, so she was placed on lasix. Pt states she has been feeling this way for a week. Pt dizzy at this time.

## 2014-10-22 NOTE — Discharge Planning (Signed)
[facility] Specialist  Spoke to patient regarding primary care resources and establishing care with a provider. Pt states she is followed with care with the [facility]. Follow up appointment made for Tuesday Nov 10,2015 at 10:45am,pt verbalized understanding of this appointment. Orange card application and instructions provided. My contact information also given for any future questions or concerns.

## 2014-10-22 NOTE — Discharge Instructions (Signed)
Chest Pain (Nonspecific) It is often hard to give a diagnosis for the cause of chest pain. There is always a chance that your pain could be related to something serious, such as a heart attack or a blood clot in the lungs. You need to follow up with your doctor. HOME CARE  If antibiotic medicine was given, take it as directed by your doctor. Finish the medicine even if you start to feel better.  For the next few days, avoid activities that bring on chest pain. Continue physical activities as told by your doctor.  Do not use any tobacco products. This includes cigarettes, chewing tobacco, and e-cigarettes.  Avoid drinking alcohol.  Only take medicine as told by your doctor.  Follow your doctor's suggestions for more testing if your chest pain does not go away.  Keep all doctor visits you made. GET HELP IF:  Your chest pain does not go away, even after treatment.  You have a rash with blisters on your chest.  You have a fever. GET HELP RIGHT AWAY IF:   You have more pain or pain that spreads to your arm, neck, jaw, back, or belly (abdomen).  You have shortness of breath.  You cough more than usual or cough up blood.  You have very bad back or belly pain.  You feel sick to your stomach (nauseous) or throw up (vomit).  You have very bad weakness.  You pass out (faint).  You have chills. This is an emergency. Do not wait to see if the problems will go away. Call your local emergency services (911 in [place] Do not drive yourself to the hospital. MAKE SURE YOU:   Understand these instructions.  Will watch your condition.  Will get help right away if you are not doing well or get worse. Document Released: 05/23/2008 Document Revised: 12/10/2013 Document Reviewed: 05/23/2008 [facility] Patient Information 2015 [place], [place]. This information is not intended to replace advice given to you by your health care provider. Make sure you discuss any questions you have with your  health care provider. Viral Infections A virus is a type of germ. Viruses can cause:  Minor sore throats.  Aches and pains.  Headaches.  Runny nose.  Rashes.  Watery eyes.  Tiredness.  Coughs.  Loss of appetite.  Feeling sick to your stomach (nausea).  Throwing up (vomiting).  Watery poop (diarrhea). HOME CARE   Only take medicines as told by your doctor.  Drink enough water and fluids to keep your pee (urine) clear or pale yellow. Sports drinks are a good choice.  Get plenty of rest and eat healthy. Soups and broths with crackers or rice are fine. GET HELP RIGHT AWAY IF:   You have a very bad headache.  You have shortness of breath.  You have chest pain or neck pain.  You have an unusual rash.  You cannot stop throwing up.  You have watery poop that does not stop.  You cannot keep fluids down.  You or your child has a temperature by mouth above 102 F (38.9 C), not controlled by medicine.  Your baby is older than 3 months with a rectal temperature of 102 F (38.9 C) or higher.  Your baby is 27 months old or younger with a rectal temperature of 100.4 F (38 C) or higher. MAKE SURE YOU:   Understand these instructions.  Will watch this condition.  Will get help right away if you are not doing well or get worse. Document Released:  11/17/2008 Document Revised: 02/27/2012 Document Reviewed: 04/12/2011 [employer] Patient Information 2015 [place], [place]. This information is not intended to replace advice given to you by your health care provider. Make sure you discuss any questions you have with your health care provider.

## 2014-10-23 LAB — I-STAT TROPONIN, ED: Troponin i, poc: 0 ng/mL (ref 0.00–0.08)

## 2014-10-28 ENCOUNTER — Ambulatory Visit (HOSPITAL_BASED_OUTPATIENT_CLINIC_OR_DEPARTMENT_OTHER): Payer: Self-pay | Admitting: *Deleted

## 2014-10-28 ENCOUNTER — Encounter: Payer: Self-pay | Admitting: Family Medicine

## 2014-10-28 ENCOUNTER — Ambulatory Visit: Payer: Self-pay | Attending: Family Medicine | Admitting: Family Medicine

## 2014-10-28 DIAGNOSIS — E1051 Type 1 diabetes mellitus with diabetic peripheral angiopathy without gangrene: Secondary | ICD-10-CM

## 2014-10-28 DIAGNOSIS — I129 Hypertensive chronic kidney disease with stage 1 through stage 4 chronic kidney disease, or unspecified chronic kidney disease: Secondary | ICD-10-CM | POA: Insufficient documentation

## 2014-10-28 DIAGNOSIS — N189 Chronic kidney disease, unspecified: Secondary | ICD-10-CM | POA: Insufficient documentation

## 2014-10-28 DIAGNOSIS — D631 Anemia in chronic kidney disease: Secondary | ICD-10-CM | POA: Insufficient documentation

## 2014-10-28 DIAGNOSIS — E1059 Type 1 diabetes mellitus with other circulatory complications: Secondary | ICD-10-CM

## 2014-10-28 DIAGNOSIS — E039 Hypothyroidism, unspecified: Secondary | ICD-10-CM | POA: Insufficient documentation

## 2014-10-28 DIAGNOSIS — R5383 Other fatigue: Secondary | ICD-10-CM | POA: Insufficient documentation

## 2014-10-28 DIAGNOSIS — E1065 Type 1 diabetes mellitus with hyperglycemia: Secondary | ICD-10-CM

## 2014-10-28 DIAGNOSIS — R55 Syncope and collapse: Secondary | ICD-10-CM | POA: Insufficient documentation

## 2014-10-28 DIAGNOSIS — E1040 Type 1 diabetes mellitus with diabetic neuropathy, unspecified: Secondary | ICD-10-CM | POA: Insufficient documentation

## 2014-10-28 DIAGNOSIS — IMO0002 Reserved for concepts with insufficient information to code with codable children: Secondary | ICD-10-CM

## 2014-10-28 DIAGNOSIS — E038 Other specified hypothyroidism: Secondary | ICD-10-CM

## 2014-10-28 DIAGNOSIS — Z794 Long term (current) use of insulin: Secondary | ICD-10-CM | POA: Insufficient documentation

## 2014-10-28 DIAGNOSIS — Z23 Encounter for immunization: Secondary | ICD-10-CM

## 2014-10-28 DIAGNOSIS — E10649 Type 1 diabetes mellitus with hypoglycemia without coma: Secondary | ICD-10-CM | POA: Insufficient documentation

## 2014-10-28 LAB — POCT GLYCOSYLATED HEMOGLOBIN (HGB A1C): Hemoglobin A1C: 10.3

## 2014-10-28 LAB — GLUCOSE, POCT (MANUAL RESULT ENTRY): POC Glucose: 220 mg/dl — AB (ref 70–99)

## 2014-10-28 LAB — TSH: TSH: 10.348 u[IU]/mL — AB (ref 0.350–4.500)

## 2014-10-28 LAB — VITAMIN B12: Vitamin B-12: 613 pg/mL (ref 211–911)

## 2014-10-28 MED ORDER — GABAPENTIN 300 MG PO CAPS: 300.0000 mg | ORAL_CAPSULE | Freq: Every day | ORAL | Status: DC

## 2014-10-28 MED ORDER — INSULIN GLARGINE 300 UNIT/ML ~~LOC~~ SOPN: 20.0000 [IU] | PEN_INJECTOR | Freq: Every day | SUBCUTANEOUS | Status: DC

## 2014-10-28 NOTE — Patient Instructions (Signed)
Ms. Fischel,  Thank you for coming in today.   1. IDDM type 1: Goal is well controlled sugars (A1c <7)  with no hypoglycemic episodes: STOP levemir START toujeo 20 U nightly Continue novolog 5-6 U with meals Check and write down CBG 3-4 times per day  2. Fatigue: multiple possible causes, B12 def, hypothyroodism, hypogycemia  Checking B12 level, TSH  3. Neuropathy : stop elavil due to morning fatigue Start gapabentin start with 150 mg (1/2 tab) nightly for first 3 days then increase to 300 mg if you are not too tired or dizzy when you wake up.   F/u in 3 weeks with nurse for blood sugar review and f/u BMP.  F/u in 6 weeks with me for diabetes and fatigue f/u.   Dr. Adrian Blackwater

## 2014-10-28 NOTE — Progress Notes (Signed)
F/U Hospital visit, Syncope Complaining of been tired, no energy

## 2014-10-28 NOTE — Assessment & Plan Note (Addendum)
A: patient compliant with synthroid. P: Recheck TSH today  If elevated increase dose  Lab Results  Component Value Date   TSH 10.348* 10/28/2014   Increased synthroid from 100 mcg to 125 mcg, repeat TSH in 6-8 weeks: around 12/09/14-12/23/14

## 2014-10-28 NOTE — Progress Notes (Signed)
   Subjective:    Patient ID: Angela [name], female    DOB: June 16, 1986, 28 y.o.   MRN: [id number] CC: HFU fatigue, IDDM type 1 f/u  HPI 28 yo F with IDDM type presents for f/u with her mother:  1. Fatigue: x 3-4 weeks. Very tired. Sometimes having low blood sugar down to 30 or 40 with combative behavior. Taking synthroid. Sleeps a lot. Taking vitamin B12. Has only elavil a few times due to daytime somnolence.   2. IDDM type 1: takes novolog 5-6 U TID and levemir 20 U qHS. Having hypoglycemia 2 times per week. Symptomatic. Was seen in ED last week for syncopal episode. Her blood sugar at the time was normal.   3. HTN: taking norvasc and lasix. Had syncope last week and again yesterday. No CP or SOB.    Review of Systems As per HPI     Objective:   Physical Exam BP 129/89 mmHg  Pulse 100  Temp(Src) 98.4 F (36.9 C) (Oral)  Resp 18  Ht 5\' 5"  (1.651 m)  Wt 149 lb (67.586 kg)  BMI 24.79 kg/m2  SpO2 100%  LMP 10/13/2014 General appearance: alert, cooperative and no distress Lungs: clear to auscultation bilaterally Heart: regular rate and rhythm, S1, S2 normal, no murmur, click, rub or gallop Extremities: extremities normal, atraumatic, no cyanosis or edema  Lab Results  Component Value Date   HGBA1C 10.3 10/28/2014        Assessment & Plan:

## 2014-10-28 NOTE — Assessment & Plan Note (Addendum)
A: uncontrolled with hypoglycemic episodes P: IDDM type 1: Goal is well controlled sugars (A1c <7)  with no hypoglycemic episodes: STOP levemir START toujeo 20 U nightly Continue novolog 5-6 U with meals Check and write down CBG 3-4 times per day Change from elavil to gabapentin for diabetic neuropathy, 300 mg nightly   If patient's fasting CBG are still > 130 as she is not having hypoglycemic episodes, increase toujeo by 5 U ( to 25 U) units at f/u visit. Patient will need BMP drawn at nurse visit, future order has been placed and will need to be released.

## 2014-10-28 NOTE — Assessment & Plan Note (Signed)
A: chronic anemia, likely hemoconcentrated when last checked P: F/u B12 today

## 2014-10-29 MED ORDER — LEVOTHYROXINE SODIUM 125 MCG PO TABS: 125.0000 ug | ORAL_TABLET | Freq: Every day | ORAL | Status: DC

## 2014-10-29 NOTE — Addendum Note (Signed)
Addended by: Boykin Nearing on: 10/29/2014 10:16 AM   Modules accepted: Orders

## 2014-10-30 ENCOUNTER — Other Ambulatory Visit: Payer: Self-pay | Admitting: *Deleted

## 2014-10-30 ENCOUNTER — Telehealth: Payer: Self-pay | Admitting: *Deleted

## 2014-10-30 NOTE — Telephone Encounter (Signed)
Left voice message to return call 

## 2014-10-30 NOTE — Telephone Encounter (Signed)
-----   Message from Minerva Ends, MD sent at 10/29/2014 10:13 AM EST ----- Please call patient.  TSH is high. Normal Vit B12. Plan to increase synthroid to 125 mcg in the morning, new Rx sent to pharmacy,  repeat TSH in 6 weeks

## 2014-10-30 NOTE — Telephone Encounter (Signed)
Pt returned call, notified lab results. Aware of Rx at [facility] ready to be pick up   Call Documentation      Betti Cruz, RMA at 10/30/2014 11:45 AM    Status: Signed      Expand All Collapse All    Left voice message to return call        Betti Cruz, RMA at 10/30/2014 11:44 AM    Status: Signed      Expand All Collapse All    ----- Message from Minerva Ends, MD sent at 10/29/2014 10:13 AM EST ----- Please call patient.  TSH is high. Normal Vit B12. Plan to increase synthroid to 125 mcg in the morning, new Rx sent to pharmacy, repeat TSH in 6

## 2014-12-01 ENCOUNTER — Ambulatory Visit: Payer: Self-pay

## 2014-12-01 ENCOUNTER — Ambulatory Visit: Payer: Self-pay | Attending: Family Medicine | Admitting: *Deleted

## 2014-12-01 VITALS — BP 121/79 | HR 94 | Temp 98.0°F | Resp 14

## 2014-12-01 DIAGNOSIS — Z794 Long term (current) use of insulin: Secondary | ICD-10-CM | POA: Insufficient documentation

## 2014-12-01 DIAGNOSIS — Z87891 Personal history of nicotine dependence: Secondary | ICD-10-CM | POA: Insufficient documentation

## 2014-12-01 DIAGNOSIS — R739 Hyperglycemia, unspecified: Secondary | ICD-10-CM

## 2014-12-01 DIAGNOSIS — I10 Essential (primary) hypertension: Secondary | ICD-10-CM | POA: Insufficient documentation

## 2014-12-01 DIAGNOSIS — E1059 Type 1 diabetes mellitus with other circulatory complications: Secondary | ICD-10-CM | POA: Insufficient documentation

## 2014-12-01 DIAGNOSIS — IMO0002 Reserved for concepts with insufficient information to code with codable children: Secondary | ICD-10-CM

## 2014-12-01 DIAGNOSIS — E1065 Type 1 diabetes mellitus with hyperglycemia: Principal | ICD-10-CM

## 2014-12-01 DIAGNOSIS — E1051 Type 1 diabetes mellitus with diabetic peripheral angiopathy without gangrene: Secondary | ICD-10-CM

## 2014-12-01 LAB — POCT URINALYSIS DIPSTICK
BILIRUBIN UA: NEGATIVE
Glucose, UA: 500
KETONES UA: NEGATIVE
Leukocytes, UA: NEGATIVE
Nitrite, UA: NEGATIVE
Protein, UA: 100
Spec Grav, UA: 1.01
Urobilinogen, UA: 0.2
pH, UA: 7

## 2014-12-01 LAB — BASIC METABOLIC PANEL
BUN: 40 mg/dL — AB (ref 6–23)
CHLORIDE: 96 meq/L (ref 96–112)
CO2: 22 mEq/L (ref 19–32)
Calcium: 9.2 mg/dL (ref 8.4–10.5)
Creat: 2.36 mg/dL — ABNORMAL HIGH (ref 0.50–1.10)
GLUCOSE: 553 mg/dL — AB (ref 70–99)
POTASSIUM: 5 meq/L (ref 3.5–5.3)
Sodium: 126 mEq/L — ABNORMAL LOW (ref 135–145)

## 2014-12-01 LAB — GLUCOSE, POCT (MANUAL RESULT ENTRY)
POC GLUCOSE: 552 mg/dL — AB (ref 70–99)
POC Glucose: 283 mg/dl — AB (ref 70–99)
POC Glucose: 173 mg/dl — AB (ref 70–99)

## 2014-12-01 MED ORDER — INSULIN ASPART 100 UNIT/ML ~~LOC~~ SOLN: 8.0000 [IU] | Freq: Three times a day (TID) | SUBCUTANEOUS | Status: DC

## 2014-12-01 MED ORDER — INSULIN ASPART 100 UNIT/ML ~~LOC~~ SOLN
10.0000 [IU] | Freq: Once | SUBCUTANEOUS | Status: AC
  Administered 2014-12-01: 10 [IU] via SUBCUTANEOUS

## 2014-12-01 MED ORDER — INSULIN GLARGINE 300 UNIT/ML ~~LOC~~ SOPN: 25.0000 [IU] | PEN_INJECTOR | Freq: Every day | SUBCUTANEOUS | Status: DC

## 2014-12-01 MED ORDER — SODIUM CHLORIDE 0.9 % IV SOLN
Freq: Once | INTRAVENOUS | Status: AC
  Administered 2014-12-01: 999 mL via INTRAVENOUS

## 2014-12-01 MED ORDER — FUROSEMIDE 40 MG PO TABS: 40.0000 mg | ORAL_TABLET | Freq: Every day | ORAL | Status: DC

## 2014-12-01 NOTE — Progress Notes (Signed)
Patient presents for repeat BMP as well as CBG and record review, however, patient did not bring record or meter Med list reviewed; patient reports taking all meds as directed Patient reports AM fasting blood sugars ranging 120-215 Patient reports before dinner blood sugars ranging 300-410 Pt c/o 3 day hx sore throat, cough productive of yellow sputum, right ear itching, nasal congestion and clear nasal drainage. States close contact with mom and 29 yo nephew who are both "sick with colds"   CBG 552  Patient reports taking 5 units novolog insulin prior to breakfast of 1 cup oatmeal, 2 eggs and 8 oz orange juice. States she has had 2 cups regular ginger-ale and wheat thins since then  U/A: negative for ketones  BP 121/79 P 94 T  98.0 oral R 14 SPO2 100%  No tenderness noted upon palpation of maxillary or frontal sinuses No cervical lymphadenopathy noted Ears: left canal clear with redness, right canal with redness and cerumen; tympanic membranes intact without redness or bulging Throat: clear with slight redness but no drainage Lungs: clear to auscultation with good breath sounds noted  Per PCP:  10 units Novolin insulin now 1 liter NS IV now Increase Toujeo to 25 units qHS Increase Novolog insulin to 8 units tid with meals; if after 3 days CBG still running high, increase novolog to 10 units tid with meals Return in 2 weeks for nurse visit for CBG and record review May refill lasix 40 mg one time; wait on further instructions after BMP results return Treat URI sx as viral for now  Patient encouraged to rest, drink plenty of fluids (water) Patient instructed to bring record and meter to next visit Return sooner if sx worsen or fail to improve   Patient advised to call for med refills at least 7 days before running out so as not to go without.  Repeat CBG 173 after 10 units novolog insulin and 1 liter IV NS infused Patient discharged to home in stable condition

## 2014-12-03 ENCOUNTER — Telehealth: Payer: Self-pay | Admitting: *Deleted

## 2014-12-03 NOTE — Telephone Encounter (Signed)
-----   Message from Minerva Ends, MD sent at 12/02/2014  2:30 PM EST ----- Stable Cr Hyperglycemia treated in office on day of blood draw

## 2014-12-03 NOTE — Telephone Encounter (Signed)
Pt aware of lab result Adviced to continue taking medication as PCP prescribe

## 2015-01-02 ENCOUNTER — Emergency Department (HOSPITAL_COMMUNITY): Payer: Self-pay

## 2015-01-02 ENCOUNTER — Inpatient Hospital Stay (HOSPITAL_COMMUNITY)
Admission: EM | Admit: 2015-01-02 | Discharge: 2015-01-03 | DRG: 638 | Disposition: A | Discharge status: Home / Self Care | Payer: Self-pay | Attending: Internal Medicine | Admitting: Internal Medicine

## 2015-01-02 ENCOUNTER — Inpatient Hospital Stay (HOSPITAL_COMMUNITY): Payer: MEDICAID

## 2015-01-02 ENCOUNTER — Encounter (HOSPITAL_COMMUNITY): Payer: Self-pay | Admitting: Emergency Medicine

## 2015-01-02 DIAGNOSIS — F1290 Cannabis use, unspecified, uncomplicated: Secondary | ICD-10-CM | POA: Diagnosis present

## 2015-01-02 DIAGNOSIS — I10 Essential (primary) hypertension: Secondary | ICD-10-CM

## 2015-01-02 DIAGNOSIS — J45909 Unspecified asthma, uncomplicated: Secondary | ICD-10-CM | POA: Diagnosis present

## 2015-01-02 DIAGNOSIS — K219 Gastro-esophageal reflux disease without esophagitis: Secondary | ICD-10-CM | POA: Diagnosis present

## 2015-01-02 DIAGNOSIS — L299 Pruritus, unspecified: Secondary | ICD-10-CM | POA: Diagnosis present

## 2015-01-02 DIAGNOSIS — E1110 Type 2 diabetes mellitus with ketoacidosis without coma: Secondary | ICD-10-CM | POA: Insufficient documentation

## 2015-01-02 DIAGNOSIS — Z9119 Patient's noncompliance with other medical treatment and regimen: Secondary | ICD-10-CM | POA: Diagnosis present

## 2015-01-02 DIAGNOSIS — K210 Gastro-esophageal reflux disease with esophagitis: Secondary | ICD-10-CM

## 2015-01-02 DIAGNOSIS — E10649 Type 1 diabetes mellitus with hypoglycemia without coma: Secondary | ICD-10-CM | POA: Diagnosis present

## 2015-01-02 DIAGNOSIS — N898 Other specified noninflammatory disorders of vagina: Secondary | ICD-10-CM | POA: Diagnosis present

## 2015-01-02 DIAGNOSIS — E039 Hypothyroidism, unspecified: Secondary | ICD-10-CM | POA: Diagnosis present

## 2015-01-02 DIAGNOSIS — E1059 Type 1 diabetes mellitus with other circulatory complications: Secondary | ICD-10-CM

## 2015-01-02 DIAGNOSIS — E1043 Type 1 diabetes mellitus with diabetic autonomic (poly)neuropathy: Secondary | ICD-10-CM | POA: Diagnosis present

## 2015-01-02 DIAGNOSIS — E875 Hyperkalemia: Secondary | ICD-10-CM | POA: Diagnosis present

## 2015-01-02 DIAGNOSIS — Z87891 Personal history of nicotine dependence: Secondary | ICD-10-CM

## 2015-01-02 DIAGNOSIS — E860 Dehydration: Secondary | ICD-10-CM | POA: Diagnosis present

## 2015-01-02 DIAGNOSIS — B373 Candidiasis of vulva and vagina: Secondary | ICD-10-CM | POA: Diagnosis present

## 2015-01-02 DIAGNOSIS — E1051 Type 1 diabetes mellitus with diabetic peripheral angiopathy without gangrene: Secondary | ICD-10-CM | POA: Diagnosis present

## 2015-01-02 DIAGNOSIS — D649 Anemia, unspecified: Secondary | ICD-10-CM | POA: Diagnosis present

## 2015-01-02 DIAGNOSIS — F329 Major depressive disorder, single episode, unspecified: Secondary | ICD-10-CM | POA: Diagnosis present

## 2015-01-02 DIAGNOSIS — E785 Hyperlipidemia, unspecified: Secondary | ICD-10-CM | POA: Diagnosis present

## 2015-01-02 DIAGNOSIS — E1310 Other specified diabetes mellitus with ketoacidosis without coma: Principal | ICD-10-CM | POA: Diagnosis present

## 2015-01-02 DIAGNOSIS — E1029 Type 1 diabetes mellitus with other diabetic kidney complication: Secondary | ICD-10-CM | POA: Diagnosis present

## 2015-01-02 DIAGNOSIS — K3184 Gastroparesis: Secondary | ICD-10-CM | POA: Diagnosis present

## 2015-01-02 DIAGNOSIS — R0789 Other chest pain: Secondary | ICD-10-CM | POA: Diagnosis present

## 2015-01-02 DIAGNOSIS — IMO0002 Reserved for concepts with insufficient information to code with codable children: Secondary | ICD-10-CM

## 2015-01-02 DIAGNOSIS — N179 Acute kidney failure, unspecified: Secondary | ICD-10-CM | POA: Diagnosis present

## 2015-01-02 DIAGNOSIS — E1065 Type 1 diabetes mellitus with hyperglycemia: Secondary | ICD-10-CM

## 2015-01-02 DIAGNOSIS — N183 Chronic kidney disease, stage 3 unspecified: Secondary | ICD-10-CM | POA: Diagnosis present

## 2015-01-02 DIAGNOSIS — G629 Polyneuropathy, unspecified: Secondary | ICD-10-CM

## 2015-01-02 DIAGNOSIS — R109 Unspecified abdominal pain: Secondary | ICD-10-CM | POA: Diagnosis present

## 2015-01-02 DIAGNOSIS — I129 Hypertensive chronic kidney disease with stage 1 through stage 4 chronic kidney disease, or unspecified chronic kidney disease: Secondary | ICD-10-CM | POA: Diagnosis present

## 2015-01-02 DIAGNOSIS — Z882 Allergy status to sulfonamides status: Secondary | ICD-10-CM

## 2015-01-02 DIAGNOSIS — L298 Other pruritus: Secondary | ICD-10-CM

## 2015-01-02 DIAGNOSIS — G709 Myoneural disorder, unspecified: Secondary | ICD-10-CM | POA: Diagnosis present

## 2015-01-02 DIAGNOSIS — G4700 Insomnia, unspecified: Secondary | ICD-10-CM | POA: Diagnosis present

## 2015-01-02 DIAGNOSIS — E1159 Type 2 diabetes mellitus with other circulatory complications: Secondary | ICD-10-CM | POA: Diagnosis present

## 2015-01-02 HISTORY — DX: Chronic kidney disease, stage 3 unspecified: N18.30

## 2015-01-02 HISTORY — DX: Chronic kidney disease, stage 3 (moderate): N18.3

## 2015-01-02 LAB — BASIC METABOLIC PANEL
Anion gap: 14 (ref 5–15)
BUN: 41 mg/dL — ABNORMAL HIGH (ref 6–23)
CO2: 24 mmol/L (ref 19–32)
Calcium: 9.4 mg/dL (ref 8.4–10.5)
Chloride: 83 mEq/L — ABNORMAL LOW (ref 96–112)
Creatinine, Ser: 2.77 mg/dL — ABNORMAL HIGH (ref 0.50–1.10)
GFR calc Af Amer: 25 mL/min — ABNORMAL LOW (ref >90)
GFR calc non Af Amer: 22 mL/min — ABNORMAL LOW (ref >90)
Glucose, Bld: 909 mg/dL (ref 70–99)
Potassium: 4.5 mmol/L (ref 3.5–5.1)
Sodium: 121 mmol/L — ABNORMAL LOW (ref 135–145)

## 2015-01-02 LAB — GLUCOSE, CAPILLARY
GLUCOSE-CAPILLARY: 108 mg/dL — AB (ref 70–99)
GLUCOSE-CAPILLARY: 50 mg/dL — AB (ref 70–99)
Glucose-Capillary: 60 mg/dL — ABNORMAL LOW (ref 70–99)
Glucose-Capillary: 178 mg/dL — ABNORMAL HIGH (ref 70–99)
Glucose-Capillary: 239 mg/dL — ABNORMAL HIGH (ref 70–99)

## 2015-01-02 LAB — URINALYSIS, ROUTINE W REFLEX MICROSCOPIC
Bilirubin Urine: NEGATIVE
KETONES UR: 15 mg/dL — AB
Leukocytes, UA: NEGATIVE
NITRITE: NEGATIVE
PROTEIN: NEGATIVE mg/dL
Specific Gravity, Urine: 1.023 (ref 1.005–1.030)
Urobilinogen, UA: 0.2 mg/dL (ref 0.0–1.0)
pH: 6 (ref 5.0–8.0)

## 2015-01-02 LAB — CBC WITH DIFFERENTIAL/PLATELET
Basophils Absolute: 0 10*3/uL (ref 0.0–0.1)
Basophils Relative: 0 % (ref 0–1)
EOS ABS: 0.1 10*3/uL (ref 0.0–0.7)
Eosinophils Relative: 1 % (ref 0–5)
HEMATOCRIT: 24.7 % — AB (ref 36.0–46.0)
Hemoglobin: 8.5 g/dL — ABNORMAL LOW (ref 12.0–15.0)
LYMPHS ABS: 1.2 10*3/uL (ref 0.7–4.0)
Lymphocytes Relative: 18 % (ref 12–46)
MCH: 26.9 pg (ref 26.0–34.0)
MCHC: 34.4 g/dL (ref 30.0–36.0)
MCV: 78.2 fL (ref 78.0–100.0)
MONO ABS: 0.5 10*3/uL (ref 0.1–1.0)
Monocytes Relative: 7 % (ref 3–12)
NEUTROS ABS: 4.9 10*3/uL (ref 1.7–7.7)
Neutrophils Relative %: 74 % (ref 43–77)
Platelets: 150 10*3/uL (ref 150–400)
RBC: 3.16 MIL/uL — AB (ref 3.87–5.11)
RDW: 11.6 % (ref 11.5–15.5)
WBC: 6.7 10*3/uL (ref 4.0–10.5)

## 2015-01-02 LAB — COMPREHENSIVE METABOLIC PANEL
ALBUMIN: 3.2 g/dL — AB (ref 3.5–5.2)
ALT: 13 U/L (ref 0–35)
AST: 24 U/L (ref 0–37)
Alkaline Phosphatase: 71 U/L (ref 39–117)
Anion gap: 6 (ref 5–15)
BUN: 32 mg/dL — ABNORMAL HIGH (ref 6–23)
CO2: 26 mmol/L (ref 19–32)
Calcium: 8.9 mg/dL (ref 8.4–10.5)
Chloride: 103 mEq/L (ref 96–112)
Creatinine, Ser: 2.13 mg/dL — ABNORMAL HIGH (ref 0.50–1.10)
GFR calc Af Amer: 35 mL/min — ABNORMAL LOW (ref >90)
GFR calc non Af Amer: 30 mL/min — ABNORMAL LOW (ref >90)
GLUCOSE: 80 mg/dL (ref 70–99)
Potassium: 3.4 mmol/L — ABNORMAL LOW (ref 3.5–5.1)
Sodium: 135 mmol/L (ref 135–145)
Total Bilirubin: 0.6 mg/dL (ref 0.3–1.2)
Total Protein: 6 g/dL (ref 6.0–8.3)

## 2015-01-02 LAB — I-STAT VENOUS BLOOD GAS, ED
ACID-BASE DEFICIT: 4 mmol/L — AB (ref 0.0–2.0)
Bicarbonate: 22.8 mEq/L (ref 20.0–24.0)
O2 Saturation: 35 %
PCO2 VEN: 49.2 mmHg (ref 45.0–50.0)
TCO2: 24 mmol/L (ref 0–100)
pH, Ven: 7.274 (ref 7.250–7.300)
pO2, Ven: 24 mmHg — CL (ref 30.0–45.0)

## 2015-01-02 LAB — PREGNANCY, URINE: PREG TEST UR: NEGATIVE

## 2015-01-02 LAB — TROPONIN I
Troponin I: <0.03 ng/mL (ref <0.031)
Troponin I: <0.03 ng/mL (ref <0.031)

## 2015-01-02 LAB — CBC
HEMATOCRIT: 25.1 % — AB (ref 36.0–46.0)
HEMOGLOBIN: 8.3 g/dL — AB (ref 12.0–15.0)
MCH: 26.3 pg (ref 26.0–34.0)
MCHC: 33.1 g/dL (ref 30.0–36.0)
MCV: 79.7 fL (ref 78.0–100.0)
Platelets: 170 10*3/uL (ref 150–400)
RBC: 3.15 MIL/uL — AB (ref 3.87–5.11)
RDW: 11.8 % (ref 11.5–15.5)
WBC: 5.8 10*3/uL (ref 4.0–10.5)

## 2015-01-02 LAB — RAPID URINE DRUG SCREEN, HOSP PERFORMED
Amphetamines: NOT DETECTED
BARBITURATES: NOT DETECTED
BENZODIAZEPINES: NOT DETECTED
COCAINE: NOT DETECTED
Opiates: NOT DETECTED
Tetrahydrocannabinol: POSITIVE — AB

## 2015-01-02 LAB — CBG MONITORING, ED
Glucose-Capillary: 462 mg/dL — ABNORMAL HIGH (ref 70–99)
Glucose-Capillary: 202 mg/dL — ABNORMAL HIGH (ref 70–99)

## 2015-01-02 LAB — URINE MICROSCOPIC-ADD ON

## 2015-01-02 LAB — CREATININE, URINE, RANDOM: CREATININE, URINE: 76.14 mg/dL

## 2015-01-02 MED ORDER — INSULIN GLARGINE 100 UNIT/ML ~~LOC~~ SOLN
20.0000 [IU] | Freq: Once | SUBCUTANEOUS | Status: AC
  Administered 2015-01-02: 20 [IU] via SUBCUTANEOUS
  Filled 2015-01-02: qty 0.2

## 2015-01-02 MED ORDER — LEVOTHYROXINE SODIUM 125 MCG PO TABS
125.0000 ug | ORAL_TABLET | Freq: Every day | ORAL | Status: DC
  Administered 2015-01-02 – 2015-01-03 (×2): 125 ug via ORAL
  Filled 2015-01-02 (×3): qty 1

## 2015-01-02 MED ORDER — ALBUTEROL SULFATE (2.5 MG/3ML) 0.083% IN NEBU: 3.0000 mL | INHALATION_SOLUTION | Freq: Four times a day (QID) | RESPIRATORY_TRACT | Status: DC | PRN

## 2015-01-02 MED ORDER — INSULIN ASPART 100 UNIT/ML ~~LOC~~ SOLN
0.0000 [IU] | Freq: Three times a day (TID) | SUBCUTANEOUS | Status: DC
Start: 2015-01-02 — End: 2015-01-03
  Administered 2015-01-02: 3 [IU] via SUBCUTANEOUS

## 2015-01-02 MED ORDER — INSULIN GLARGINE 100 UNIT/ML ~~LOC~~ SOLN
20.0000 [IU] | Freq: Every day | SUBCUTANEOUS | Status: DC
  Administered 2015-01-02: 20 [IU] via SUBCUTANEOUS
  Filled 2015-01-02 (×4): qty 0.2

## 2015-01-02 MED ORDER — HEPARIN SODIUM (PORCINE) 5000 UNIT/ML IJ SOLN
5000.0000 [IU] | Freq: Three times a day (TID) | INTRAMUSCULAR | Status: DC
  Administered 2015-01-02 – 2015-01-03 (×4): 5000 [IU] via SUBCUTANEOUS
  Filled 2015-01-02 (×6): qty 1

## 2015-01-02 MED ORDER — SODIUM CHLORIDE 0.9 % IV SOLN
1000.0000 mL | INTRAVENOUS | Status: AC
  Administered 2015-01-02 (×2): 1000 mL via INTRAVENOUS

## 2015-01-02 MED ORDER — ONDANSETRON HCL 4 MG/2ML IJ SOLN: 4.0000 mg | Freq: Three times a day (TID) | INTRAMUSCULAR | Status: AC | PRN

## 2015-01-02 MED ORDER — FLUCONAZOLE 150 MG PO TABS
150.0000 mg | ORAL_TABLET | Freq: Once | ORAL | Status: AC
  Administered 2015-01-02: 150 mg via ORAL
  Filled 2015-01-02: qty 1

## 2015-01-02 MED ORDER — ONDANSETRON HCL 4 MG/2ML IJ SOLN
4.0000 mg | Freq: Once | INTRAMUSCULAR | Status: AC
  Administered 2015-01-02: 4 mg via INTRAVENOUS
  Filled 2015-01-02: qty 2

## 2015-01-02 MED ORDER — SODIUM CHLORIDE 0.9 % IJ SOLN
3.0000 mL | Freq: Two times a day (BID) | INTRAMUSCULAR | Status: DC
  Administered 2015-01-02 (×2): 3 mL via INTRAVENOUS

## 2015-01-02 MED ORDER — INSULIN ASPART 100 UNIT/ML ~~LOC~~ SOLN
10.0000 [IU] | Freq: Once | SUBCUTANEOUS | Status: AC
  Administered 2015-01-02: 10 [IU] via SUBCUTANEOUS
  Filled 2015-01-02: qty 1

## 2015-01-02 MED ORDER — SODIUM CHLORIDE 0.9 % IV SOLN
1000.0000 mL | INTRAVENOUS | Status: DC
  Administered 2015-01-02 (×2): 1000 mL via INTRAVENOUS

## 2015-01-02 MED ORDER — SODIUM CHLORIDE 0.9 % IV SOLN
INTRAVENOUS | Status: DC
  Filled 2015-01-02: qty 2.5

## 2015-01-02 MED ORDER — PANTOPRAZOLE SODIUM 40 MG PO TBEC
40.0000 mg | DELAYED_RELEASE_TABLET | Freq: Every day | ORAL | Status: DC
  Administered 2015-01-02 – 2015-01-03 (×2): 40 mg via ORAL
  Filled 2015-01-02: qty 1

## 2015-01-02 MED ORDER — AMLODIPINE BESYLATE 5 MG PO TABS
5.0000 mg | ORAL_TABLET | Freq: Every day | ORAL | Status: DC
  Administered 2015-01-02 – 2015-01-03 (×2): 5 mg via ORAL
  Filled 2015-01-02 (×2): qty 1

## 2015-01-02 MED ORDER — ACETAMINOPHEN 325 MG PO TABS: 650.0000 mg | ORAL_TABLET | Freq: Four times a day (QID) | ORAL | Status: DC | PRN

## 2015-01-02 MED ORDER — METOCLOPRAMIDE HCL 5 MG/ML IJ SOLN
10.0000 mg | INTRAMUSCULAR | Status: AC
  Administered 2015-01-02: 10 mg via INTRAVENOUS
  Filled 2015-01-02: qty 2

## 2015-01-02 MED ORDER — SODIUM CHLORIDE 0.9 % IV BOLUS (SEPSIS)
1000.0000 mL | Freq: Once | INTRAVENOUS | Status: AC
  Administered 2015-01-02: 1000 mL via INTRAVENOUS

## 2015-01-02 MED ORDER — SODIUM CHLORIDE 0.9 % IV SOLN
1000.0000 mL | Freq: Once | INTRAVENOUS | Status: AC
  Administered 2015-01-02: 1000 mL via INTRAVENOUS

## 2015-01-02 MED ORDER — DEXTROSE-NACL 5-0.45 % IV SOLN: INTRAVENOUS | Status: DC

## 2015-01-02 MED ORDER — PRAVASTATIN SODIUM 20 MG PO TABS
20.0000 mg | ORAL_TABLET | Freq: Every evening | ORAL | Status: DC
Start: 2015-01-02 — End: 2015-01-03
  Administered 2015-01-02: 20 mg via ORAL
  Filled 2015-01-02 (×2): qty 1

## 2015-01-02 MED ORDER — FERROUS SULFATE 325 (65 FE) MG PO TABS
325.0000 mg | ORAL_TABLET | Freq: Three times a day (TID) | ORAL | Status: DC
  Administered 2015-01-02 – 2015-01-03 (×5): 325 mg via ORAL
  Filled 2015-01-02 (×7): qty 1

## 2015-01-02 MED ORDER — GABAPENTIN 300 MG PO CAPS
300.0000 mg | ORAL_CAPSULE | Freq: Every day | ORAL | Status: DC
  Administered 2015-01-02: 300 mg via ORAL
  Filled 2015-01-02 (×2): qty 1

## 2015-01-02 MED ORDER — CYANOCOBALAMIN 250 MCG PO TABS
250.0000 ug | ORAL_TABLET | Freq: Every day | ORAL | Status: DC
  Administered 2015-01-02 – 2015-01-03 (×2): 250 ug via ORAL
  Filled 2015-01-02 (×2): qty 1

## 2015-01-02 NOTE — Care Management Note (Addendum)
  Page 1 of 1   01/02/2015     11:45:10 AM CARE MANAGEMENT NOTE 01/02/2015  Patient:  Angela [name], Angela [name]   Account Number:  [id number]  Date Initiated:  01/02/2015  Documentation initiated by:  Mariann Laster  Subjective/Objective Assessment:   hyperglycemia with blood sugar 909, DKA, AKI  nausea, vomiting, bilateral flank pain, vaginal itchy, chest pressure.     Action/Plan:   CM to follow for disposition needs   Anticipated DC Date:  01/05/2015   Anticipated DC Plan:  HOME/SELF CARE  In-house referral  Clinical Social Worker      DC Planning Services  CM consult      Choice offered to / List presented to:             Status of service:  Completed, signed off Medicare Important Message given?  NO (If response is "NO", the following Medicare IM given date fields will be blank) Date Medicare IM given:   Medicare IM given by:   Date Additional Medicare IM given:   Additional Medicare IM given by:    Discharge Disposition:  HOME/SELF CARE  Per UR Regulation:  Reviewed for med. necessity/level of care/duration of stay  If discussed at [place] of Stay Meetings, dates discussed:    Comments:  Kotaro Buer RN, BSN, MSHL, CCM  Nurse - Case Manager,  ([facility]) [phone]  01/02/2015 Social:  From home with family/mother; has significant other Hx/o 2 admissions over the past 6 months Hx/o uses illicit drugs (Marijuana). SW aware. Disposition Plan:  Home / Self Care.
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
## 2015-01-02 NOTE — ED Notes (Signed)
POCT CBG: High
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
## 2015-01-02 NOTE — Progress Notes (Signed)
I've seen and examined Angela [name] at bedside and reviewed her chart. She was admitted this morning by Dr. Blaine Hamper. Please refer to his comprehensive H&P for details. She denies any new complaints. We'll continue with current management.

## 2015-01-02 NOTE — ED Notes (Signed)
Notified triage RN of elevated CBG reading "HI"

## 2015-01-02 NOTE — ED Notes (Signed)
The patient has been feeling sick for two weeks.  The patient said she has been throwing up and has had frequent urination as well.  The patient said she is supposed to check her blood sugar three times a day but she has not checked it in the past week.  She says she has been taking her insulin but is not able to keep any food down.  She is complaining of chest pressure as well.

## 2015-01-02 NOTE — ED Notes (Signed)
CBG 462

## 2015-01-02 NOTE — Progress Notes (Addendum)
UR completed Maddox Hlavaty K. Felton Buczynski, RN, BSN, MSHL, CCM  01/02/2015 11:35 AM

## 2015-01-03 DIAGNOSIS — N183 Chronic kidney disease, stage 3 (moderate): Secondary | ICD-10-CM

## 2015-01-03 DIAGNOSIS — N179 Acute kidney failure, unspecified: Secondary | ICD-10-CM

## 2015-01-03 LAB — COMPREHENSIVE METABOLIC PANEL
ALT: 11 U/L (ref 0–35)
ANION GAP: 3 — AB (ref 5–15)
AST: 17 U/L (ref 0–37)
Albumin: 3.1 g/dL — ABNORMAL LOW (ref 3.5–5.2)
Alkaline Phosphatase: 60 U/L (ref 39–117)
BILIRUBIN TOTAL: 0.6 mg/dL (ref 0.3–1.2)
BUN: 20 mg/dL (ref 6–23)
CHLORIDE: 107 meq/L (ref 96–112)
CO2: 27 mmol/L (ref 19–32)
Calcium: 8.7 mg/dL (ref 8.4–10.5)
Creatinine, Ser: 1.75 mg/dL — ABNORMAL HIGH (ref 0.50–1.10)
GFR calc Af Amer: 44 mL/min — ABNORMAL LOW (ref >90)
GFR, EST NON AFRICAN AMERICAN: 38 mL/min — AB (ref >90)
Glucose, Bld: 37 mg/dL — CL (ref 70–99)
Potassium: 3.4 mmol/L — ABNORMAL LOW (ref 3.5–5.1)
Sodium: 137 mmol/L (ref 135–145)
TOTAL PROTEIN: 5.8 g/dL — AB (ref 6.0–8.3)

## 2015-01-03 LAB — HIV ANTIBODY (ROUTINE TESTING W REFLEX)
HIV 1/O/2 Abs-Index Value: <1 (ref <1.00)
HIV-1/HIV-2 Ab: NONREACTIVE

## 2015-01-03 LAB — CBC
HEMATOCRIT: 23.9 % — AB (ref 36.0–46.0)
Hemoglobin: 7.9 g/dL — ABNORMAL LOW (ref 12.0–15.0)
MCH: 26.9 pg (ref 26.0–34.0)
MCHC: 33.1 g/dL (ref 30.0–36.0)
MCV: 81.3 fL (ref 78.0–100.0)
Platelets: 159 10*3/uL (ref 150–400)
RBC: 2.94 MIL/uL — AB (ref 3.87–5.11)
RDW: 12 % (ref 11.5–15.5)
WBC: 5.1 10*3/uL (ref 4.0–10.5)

## 2015-01-03 LAB — GLUCOSE, CAPILLARY
GLUCOSE-CAPILLARY: 107 mg/dL — AB (ref 70–99)
GLUCOSE-CAPILLARY: 144 mg/dL — AB (ref 70–99)
Glucose-Capillary: 35 mg/dL — CL (ref 70–99)

## 2015-01-03 LAB — UREA NITROGEN, URINE: UREA NITROGEN UR: 475 mg/dL

## 2015-01-03 MED ORDER — FLUCONAZOLE 100 MG PO TABS: 100.0000 mg | ORAL_TABLET | Freq: Every day | ORAL | Status: DC

## 2015-01-03 MED ORDER — FLUCONAZOLE 100 MG PO TABS
100.0000 mg | ORAL_TABLET | Freq: Every day | ORAL | Status: DC
Start: 2015-01-03 — End: 2015-01-03

## 2015-01-03 MED ORDER — DEXTROSE 50 % IV SOLN
12.5000 g | Freq: Once | INTRAVENOUS | Status: AC
  Administered 2015-01-03: 12.5 g via INTRAVENOUS

## 2015-01-03 MED ORDER — DEXTROSE 50 % IV SOLN
INTRAVENOUS | Status: AC
  Administered 2015-01-03: 07:00:00
  Filled 2015-01-03: qty 50
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
## 2015-02-05 ENCOUNTER — Ambulatory Visit: Payer: Self-pay | Attending: Family Medicine | Admitting: Family Medicine

## 2015-02-05 ENCOUNTER — Encounter: Payer: Self-pay | Admitting: Family Medicine

## 2015-02-05 ENCOUNTER — Ambulatory Visit (HOSPITAL_COMMUNITY)
Admission: RE | Admit: 2015-02-05 | Discharge: 2015-02-05 | Disposition: A | Discharge status: Home / Self Care | Payer: MEDICAID | Source: Ambulatory Visit | Attending: Family Medicine | Admitting: Family Medicine

## 2015-02-05 VITALS — BP 139/84 | HR 101 | Temp 98.3°F | Resp 16 | Ht 64.0 in | Wt 147.0 lb

## 2015-02-05 DIAGNOSIS — Z6825 Body mass index (BMI) 25.0-25.9, adult: Secondary | ICD-10-CM | POA: Insufficient documentation

## 2015-02-05 DIAGNOSIS — Z87891 Personal history of nicotine dependence: Secondary | ICD-10-CM | POA: Insufficient documentation

## 2015-02-05 DIAGNOSIS — K30 Functional dyspepsia: Secondary | ICD-10-CM

## 2015-02-05 DIAGNOSIS — N1831 Chronic kidney disease, stage 3a: Secondary | ICD-10-CM

## 2015-02-05 DIAGNOSIS — E039 Hypothyroidism, unspecified: Secondary | ICD-10-CM

## 2015-02-05 DIAGNOSIS — E109 Type 1 diabetes mellitus without complications: Secondary | ICD-10-CM

## 2015-02-05 DIAGNOSIS — R300 Dysuria: Secondary | ICD-10-CM | POA: Insufficient documentation

## 2015-02-05 DIAGNOSIS — R319 Hematuria, unspecified: Secondary | ICD-10-CM | POA: Insufficient documentation

## 2015-02-05 DIAGNOSIS — R112 Nausea with vomiting, unspecified: Secondary | ICD-10-CM | POA: Insufficient documentation

## 2015-02-05 DIAGNOSIS — R109 Unspecified abdominal pain: Secondary | ICD-10-CM | POA: Insufficient documentation

## 2015-02-05 DIAGNOSIS — R630 Anorexia: Secondary | ICD-10-CM | POA: Insufficient documentation

## 2015-02-05 DIAGNOSIS — IMO0002 Reserved for concepts with insufficient information to code with codable children: Secondary | ICD-10-CM

## 2015-02-05 DIAGNOSIS — E1059 Type 1 diabetes mellitus with other circulatory complications: Secondary | ICD-10-CM

## 2015-02-05 DIAGNOSIS — Z794 Long term (current) use of insulin: Secondary | ICD-10-CM | POA: Insufficient documentation

## 2015-02-05 DIAGNOSIS — N12 Tubulo-interstitial nephritis, not specified as acute or chronic: Secondary | ICD-10-CM | POA: Insufficient documentation

## 2015-02-05 DIAGNOSIS — R6883 Chills (without fever): Secondary | ICD-10-CM | POA: Insufficient documentation

## 2015-02-05 DIAGNOSIS — E1051 Type 1 diabetes mellitus with diabetic peripheral angiopathy without gangrene: Secondary | ICD-10-CM

## 2015-02-05 DIAGNOSIS — N183 Chronic kidney disease, stage 3 (moderate): Secondary | ICD-10-CM

## 2015-02-05 DIAGNOSIS — E1065 Type 1 diabetes mellitus with hyperglycemia: Secondary | ICD-10-CM

## 2015-02-05 DIAGNOSIS — E1165 Type 2 diabetes mellitus with hyperglycemia: Secondary | ICD-10-CM | POA: Insufficient documentation

## 2015-02-05 LAB — BASIC METABOLIC PANEL
BUN: 33 mg/dL — ABNORMAL HIGH (ref 6–23)
CO2: 23 mEq/L (ref 19–32)
Calcium: 10 mg/dL (ref 8.4–10.5)
Chloride: 102 mEq/L (ref 96–112)
Creat: 2.28 mg/dL — ABNORMAL HIGH (ref 0.50–1.10)
Glucose, Bld: 181 mg/dL — ABNORMAL HIGH (ref 70–99)
Potassium: 5.5 mEq/L — ABNORMAL HIGH (ref 3.5–5.3)
Sodium: 137 mEq/L (ref 135–145)

## 2015-02-05 LAB — CBC WITH DIFFERENTIAL/PLATELET
BASOS ABS: 0 10*3/uL (ref 0.0–0.1)
Basophils Relative: 0 % (ref 0–1)
EOS ABS: 0 10*3/uL (ref 0.0–0.7)
EOS PCT: 0 % (ref 0–5)
HEMATOCRIT: 27.7 % — AB (ref 36.0–46.0)
HEMOGLOBIN: 8.7 g/dL — AB (ref 12.0–15.0)
LYMPHS PCT: 25 % (ref 12–46)
Lymphs Abs: 1.6 10*3/uL (ref 0.7–4.0)
MCH: 27.1 pg (ref 26.0–34.0)
MCHC: 31.4 g/dL (ref 30.0–36.0)
MCV: 86.3 fL (ref 78.0–100.0)
MPV: 13.1 fL — AB (ref 8.6–12.4)
Monocytes Absolute: 0.3 10*3/uL (ref 0.1–1.0)
Monocytes Relative: 5 % (ref 3–12)
NEUTROS PCT: 70 % (ref 43–77)
Neutro Abs: 4.6 10*3/uL (ref 1.7–7.7)
Platelets: 228 10*3/uL (ref 150–400)
RBC: 3.21 MIL/uL — ABNORMAL LOW (ref 3.87–5.11)
RDW: 13.4 % (ref 11.5–15.5)
WBC: 6.5 10*3/uL (ref 4.0–10.5)

## 2015-02-05 LAB — POCT URINALYSIS DIPSTICK
Bilirubin, UA: NEGATIVE
Glucose, UA: 500
Ketones, UA: 40
Leukocytes, UA: NEGATIVE
Nitrite, UA: NEGATIVE
Protein, UA: 100
Spec Grav, UA: 1.015
Urobilinogen, UA: 0.2
pH, UA: 6

## 2015-02-05 LAB — GLUCOSE, POCT (MANUAL RESULT ENTRY): POC Glucose: 176 mg/dl — AB (ref 70–99)

## 2015-02-05 LAB — POCT GLYCOSYLATED HEMOGLOBIN (HGB A1C): Hemoglobin A1C: 11.2

## 2015-02-05 LAB — POCT URINE PREGNANCY: PREG TEST UR: NEGATIVE

## 2015-02-05 MED ORDER — TRAMADOL HCL 50 MG PO TABS: 50.0000 mg | ORAL_TABLET | Freq: Three times a day (TID) | ORAL | Status: DC | PRN

## 2015-02-05 MED ORDER — INSULIN ASPART 100 UNIT/ML ~~LOC~~ SOLN: 12.0000 [IU] | Freq: Three times a day (TID) | SUBCUTANEOUS | Status: DC

## 2015-02-05 MED ORDER — CEPHALEXIN 500 MG PO CAPS: 500.0000 mg | ORAL_CAPSULE | Freq: Four times a day (QID) | ORAL | Status: DC

## 2015-02-05 MED ORDER — PROMETHAZINE HCL 25 MG/ML IJ SOLN
12.5000 mg | Freq: Once | INTRAMUSCULAR | Status: AC
  Administered 2015-02-05: 12.5 mg via INTRAVENOUS

## 2015-02-05 MED ORDER — ONDANSETRON HCL 4 MG PO TABS: 8.0000 mg | ORAL_TABLET | Freq: Once | ORAL | Status: DC

## 2015-02-05 MED ORDER — CEFTRIAXONE SODIUM 1 G IJ SOLR: 1.0000 g | Freq: Once | INTRAMUSCULAR | Status: DC

## 2015-02-05 MED ORDER — PROMETHAZINE HCL 25 MG PO TABS: 25.0000 mg | ORAL_TABLET | Freq: Three times a day (TID) | ORAL | Status: DC | PRN

## 2015-02-05 NOTE — Progress Notes (Signed)
   Subjective:    Patient ID: Angela [name], female    DOB: 1986-01-07, 29 y.o.   MRN: [id number] CC: nausea, vomiting, emesis  HPI 29 yo F with IDDM type 1 presents and hypotthyroidism presents for f/u visit:  1. B/l flank pain: x 1 week. R>L. With nausea, emesis, anorexia, intermittent dysuria, chills. No fever, diarrhea, no known sick contacts. Symptoms are worsening. She is still taking insulin despite not eating solids for many days.   2. IDDM type 1: taking novolog 8 U TID and  toujeo 30 U once daily. Sugars running high.   Soc Hx: former smoker,  Quit   Review of Systems As per HPI     Objective:   Physical Exam BP 137/79 mmHg  Pulse 97  Temp(Src) 98.3 F (36.8 C)  Resp 16  Ht 5\' 4"  (1.626 m)  Wt 147 lb (66.679 kg)  BMI 25.22 kg/m2  SpO2 98%  LMP 01/18/2015 General appearance: alert, cooperative and no distress Lungs: clear to auscultation bilaterally Heart: regular rate and rhythm, S1, S2 normal, no murmur, click, rub or gallop Abdomen: soft, non-tender; bowel sounds normal; no masses,  no organomegaly  Back: symmetric, no skin rash or breakdown, b/l CVA tenderness R>L Extremities: extremities normal, atraumatic, no cyanosis or edema  Lab Results  Component Value Date   HGBA1C 11.2 02/05/2015  CBG  176 UA: 500 glucose, 40 ketones, 100 protein, negative nitrite and leukocytes, moderate blood.  U preg: negative   Patient treated with zofran 8 mg PO, unable to tolerate pretzels, able to tolerate diet ginger ale and a few sips of low calorie gatorade. NS 1 L IV given. Phenergan 12.5 mg IV x one given.      Assessment & Plan:

## 2015-02-05 NOTE — Assessment & Plan Note (Addendum)
A: Pain in flanks:  P: Possible stones given blood in urine w/o leukocytes and nitrites Plan: CT Scan today-go straight there Medication: Phenergan for nausea Tramadol for pain Keflex to cover for UTI just in case there is no stone.   Addendum: Negative urine culture, no pyelo on CT Non obstructing stone on CT   Plan: D/c keflex

## 2015-02-05 NOTE — Assessment & Plan Note (Signed)
1. IDDM type 1: A1c is elevated at 11. Goal A1c is  <7 Plan:  Increase novolog to 12 U three times a day Continue once daily Toujeo  Please apply for [place] discount and orange card, you can also inquire if any of your medications are on the PASS (medications assistance) list.  I also recommend that you apply for medicaid and medicare at the social security office.  Having the [facility]  Discount and orange card will help with access to imaging and subspecialist like Endocrinology.

## 2015-02-05 NOTE — Progress Notes (Signed)
Patient c/o vomiting,diarrhea and generalized body ache since last week Patient has not checked her blood sugar in over a month Patient has not eaten today Patient supposed to coming for frequent follow ups but her job has prevented her from coming

## 2015-02-05 NOTE — Patient Instructions (Addendum)
Ms. Veith,  Thank you for coming in today.  1. IDDM type 1: A1c is elevated at 11. Goal A1c is  <7 Plan:  Increase novolog to 12 U three times a day Continue once daily Toujeo  Please apply for [place] discount and orange card, you can also inquire if any of your medications are on the PASS (medications assistance) list.  I also recommend that you apply for medicaid and medicare at the social security office.  Having the [facility]  Discount and orange card will help with access to imaging and subspecialist like Endocrinology.   2. Pain in flanks:  Possible stones given blood in urine w/o leukocytes and nitrites Plan: CT Scan today-go straight there Medication: Phenergan for nausea Tramadol for pain Keflex to cover for UTI just in case there is no stone.   F/u in 2 weeks  Dr. Adrian Blackwater

## 2015-02-06 ENCOUNTER — Other Ambulatory Visit: Payer: Self-pay | Admitting: *Deleted

## 2015-02-06 ENCOUNTER — Telehealth: Payer: Self-pay | Admitting: Family Medicine

## 2015-02-06 ENCOUNTER — Telehealth: Payer: Self-pay | Admitting: *Deleted

## 2015-02-06 DIAGNOSIS — N183 Chronic kidney disease, stage 3 (moderate): Secondary | ICD-10-CM

## 2015-02-06 DIAGNOSIS — N1831 Chronic kidney disease, stage 3a: Secondary | ICD-10-CM

## 2015-02-06 DIAGNOSIS — D631 Anemia in chronic kidney disease: Secondary | ICD-10-CM

## 2015-02-06 DIAGNOSIS — N189 Chronic kidney disease, unspecified: Principal | ICD-10-CM

## 2015-02-06 MED ORDER — FERROUS SULFATE 325 (65 FE) MG PO TABS: 325.0000 mg | ORAL_TABLET | Freq: Three times a day (TID) | ORAL | Status: DC

## 2015-02-06 NOTE — Telephone Encounter (Signed)
LVM to return call.

## 2015-02-06 NOTE — Telephone Encounter (Signed)
Pt aware of lab result and CT

## 2015-02-06 NOTE — Assessment & Plan Note (Signed)
A: CKD with hyperkalemia in the setting of GI illness P: Hydration F/u BMP in one week Lasix prn K+>5

## 2015-02-06 NOTE — Telephone Encounter (Signed)
Angela [name], please call  Stable anemia. Patient should be sure to take oral iron at least 2x, preferably 3x daily sent in refill. Normal WBC Cr stable K+ a bit high but not too high  Patient to hydrate as much as possible F/u next week for potassium recheck if still > 5 will give lasix

## 2015-02-06 NOTE — Telephone Encounter (Signed)
-----   Message from Minerva Ends, MD sent at 02/06/2015  1:48 PM EST ----- CT abdomen with L kidney stone, non obstructing, should pass on its own.  No evidence of kidney infection  ? Fall on L side or hit to L side? There is some evidence of this on CT abdomen  Plan to continue current treatment plan. Still awaiting urine culture

## 2015-02-07 LAB — URINE CULTURE
COLONY COUNT: NO GROWTH
Organism ID, Bacteria: NO GROWTH

## 2015-02-08 NOTE — Addendum Note (Signed)
Addended by: Boykin Nearing on: 02/08/2015 08:48 AM   Modules accepted: Orders, Medications

## 2015-02-11 ENCOUNTER — Telehealth: Payer: Self-pay | Admitting: Family Medicine

## 2015-02-11 NOTE — Telephone Encounter (Signed)
Pt called returning phone call from referral coordinator, she was not aware of referral. Please f/u with pt

## 2015-02-12 ENCOUNTER — Other Ambulatory Visit (HOSPITAL_COMMUNITY): Payer: Self-pay

## 2015-02-12 ENCOUNTER — Emergency Department (HOSPITAL_COMMUNITY): Payer: Self-pay

## 2015-02-12 ENCOUNTER — Encounter (HOSPITAL_COMMUNITY): Payer: Self-pay

## 2015-02-12 ENCOUNTER — Inpatient Hospital Stay (HOSPITAL_COMMUNITY)
Admission: EM | Admit: 2015-02-12 | Discharge: 2015-02-14 | DRG: 638 | Disposition: A | Discharge status: Home / Self Care | Payer: Self-pay | Attending: Internal Medicine | Admitting: Internal Medicine

## 2015-02-12 DIAGNOSIS — R0789 Other chest pain: Secondary | ICD-10-CM | POA: Diagnosis present

## 2015-02-12 DIAGNOSIS — E039 Hypothyroidism, unspecified: Secondary | ICD-10-CM | POA: Diagnosis present

## 2015-02-12 DIAGNOSIS — I10 Essential (primary) hypertension: Secondary | ICD-10-CM | POA: Diagnosis present

## 2015-02-12 DIAGNOSIS — R197 Diarrhea, unspecified: Secondary | ICD-10-CM

## 2015-02-12 DIAGNOSIS — E1065 Type 1 diabetes mellitus with hyperglycemia: Secondary | ICD-10-CM

## 2015-02-12 DIAGNOSIS — I129 Hypertensive chronic kidney disease with stage 1 through stage 4 chronic kidney disease, or unspecified chronic kidney disease: Secondary | ICD-10-CM | POA: Diagnosis present

## 2015-02-12 DIAGNOSIS — E1051 Type 1 diabetes mellitus with diabetic peripheral angiopathy without gangrene: Secondary | ICD-10-CM

## 2015-02-12 DIAGNOSIS — E860 Dehydration: Secondary | ICD-10-CM | POA: Diagnosis present

## 2015-02-12 DIAGNOSIS — N183 Chronic kidney disease, stage 3 (moderate): Secondary | ICD-10-CM | POA: Diagnosis present

## 2015-02-12 DIAGNOSIS — K219 Gastro-esophageal reflux disease without esophagitis: Secondary | ICD-10-CM | POA: Diagnosis present

## 2015-02-12 DIAGNOSIS — F329 Major depressive disorder, single episode, unspecified: Secondary | ICD-10-CM | POA: Diagnosis present

## 2015-02-12 DIAGNOSIS — E1029 Type 1 diabetes mellitus with other diabetic kidney complication: Secondary | ICD-10-CM | POA: Diagnosis present

## 2015-02-12 DIAGNOSIS — G629 Polyneuropathy, unspecified: Secondary | ICD-10-CM

## 2015-02-12 DIAGNOSIS — J45909 Unspecified asthma, uncomplicated: Secondary | ICD-10-CM | POA: Diagnosis present

## 2015-02-12 DIAGNOSIS — R112 Nausea with vomiting, unspecified: Secondary | ICD-10-CM | POA: Diagnosis present

## 2015-02-12 DIAGNOSIS — N179 Acute kidney failure, unspecified: Secondary | ICD-10-CM | POA: Diagnosis present

## 2015-02-12 DIAGNOSIS — K3184 Gastroparesis: Secondary | ICD-10-CM | POA: Diagnosis present

## 2015-02-12 DIAGNOSIS — E1159 Type 2 diabetes mellitus with other circulatory complications: Secondary | ICD-10-CM | POA: Diagnosis present

## 2015-02-12 DIAGNOSIS — E1011 Type 1 diabetes mellitus with ketoacidosis with coma: Secondary | ICD-10-CM | POA: Insufficient documentation

## 2015-02-12 DIAGNOSIS — IMO0002 Reserved for concepts with insufficient information to code with codable children: Secondary | ICD-10-CM

## 2015-02-12 DIAGNOSIS — E1110 Type 2 diabetes mellitus with ketoacidosis without coma: Secondary | ICD-10-CM | POA: Diagnosis present

## 2015-02-12 DIAGNOSIS — Z794 Long term (current) use of insulin: Secondary | ICD-10-CM

## 2015-02-12 DIAGNOSIS — Z888 Allergy status to other drugs, medicaments and biological substances status: Secondary | ICD-10-CM

## 2015-02-12 DIAGNOSIS — N200 Calculus of kidney: Secondary | ICD-10-CM

## 2015-02-12 DIAGNOSIS — R6510 Systemic inflammatory response syndrome (SIRS) of non-infectious origin without acute organ dysfunction: Secondary | ICD-10-CM | POA: Diagnosis present

## 2015-02-12 DIAGNOSIS — E1059 Type 1 diabetes mellitus with other circulatory complications: Secondary | ICD-10-CM

## 2015-02-12 DIAGNOSIS — E785 Hyperlipidemia, unspecified: Secondary | ICD-10-CM | POA: Diagnosis present

## 2015-02-12 DIAGNOSIS — E1043 Type 1 diabetes mellitus with diabetic autonomic (poly)neuropathy: Secondary | ICD-10-CM | POA: Diagnosis present

## 2015-02-12 DIAGNOSIS — Z881 Allergy status to other antibiotic agents status: Secondary | ICD-10-CM

## 2015-02-12 DIAGNOSIS — D631 Anemia in chronic kidney disease: Secondary | ICD-10-CM | POA: Diagnosis present

## 2015-02-12 DIAGNOSIS — E10649 Type 1 diabetes mellitus with hypoglycemia without coma: Secondary | ICD-10-CM | POA: Diagnosis present

## 2015-02-12 DIAGNOSIS — Z882 Allergy status to sulfonamides status: Secondary | ICD-10-CM

## 2015-02-12 DIAGNOSIS — K210 Gastro-esophageal reflux disease with esophagitis: Secondary | ICD-10-CM

## 2015-02-12 DIAGNOSIS — E1010 Type 1 diabetes mellitus with ketoacidosis without coma: Principal | ICD-10-CM | POA: Diagnosis present

## 2015-02-12 DIAGNOSIS — G4700 Insomnia, unspecified: Secondary | ICD-10-CM | POA: Diagnosis present

## 2015-02-12 DIAGNOSIS — E875 Hyperkalemia: Secondary | ICD-10-CM | POA: Diagnosis present

## 2015-02-12 DIAGNOSIS — N189 Chronic kidney disease, unspecified: Secondary | ICD-10-CM | POA: Diagnosis present

## 2015-02-12 DIAGNOSIS — F1290 Cannabis use, unspecified, uncomplicated: Secondary | ICD-10-CM | POA: Diagnosis present

## 2015-02-12 DIAGNOSIS — Z87891 Personal history of nicotine dependence: Secondary | ICD-10-CM

## 2015-02-12 LAB — CBG MONITORING, ED
GLUCOSE-CAPILLARY: 440 mg/dL — AB (ref 70–99)
Glucose-Capillary: 496 mg/dL — ABNORMAL HIGH (ref 70–99)

## 2015-02-12 LAB — I-STAT CHEM 8, ED
BUN: 32 mg/dL — ABNORMAL HIGH (ref 6–23)
CHLORIDE: 97 mmol/L (ref 96–112)
CREATININE: 2.2 mg/dL — AB (ref 0.50–1.10)
Calcium, Ion: 1.11 mmol/L — ABNORMAL LOW (ref 1.12–1.23)
Glucose, Bld: 654 mg/dL (ref 70–99)
HCT: 31 % — ABNORMAL LOW (ref 36.0–46.0)
Hemoglobin: 10.5 g/dL — ABNORMAL LOW (ref 12.0–15.0)
POTASSIUM: 6.4 mmol/L — AB (ref 3.5–5.1)
SODIUM: 129 mmol/L — AB (ref 135–145)
TCO2: 16 mmol/L (ref 0–100)

## 2015-02-12 LAB — BASIC METABOLIC PANEL
ANION GAP: 19 — AB (ref 5–15)
BUN: 34 mg/dL — AB (ref 6–23)
CHLORIDE: 95 mmol/L — AB (ref 96–112)
CO2: 18 mmol/L — ABNORMAL LOW (ref 19–32)
CREATININE: 2.47 mg/dL — AB (ref 0.50–1.10)
Calcium: 8.6 mg/dL (ref 8.4–10.5)
GFR calc non Af Amer: 25 mL/min — ABNORMAL LOW (ref >90)
GFR, EST AFRICAN AMERICAN: 29 mL/min — AB (ref >90)
Glucose, Bld: 508 mg/dL — ABNORMAL HIGH (ref 70–99)
Potassium: 5.3 mmol/L — ABNORMAL HIGH (ref 3.5–5.1)
Sodium: 132 mmol/L — ABNORMAL LOW (ref 135–145)

## 2015-02-12 LAB — CBC WITH DIFFERENTIAL/PLATELET
BASOS PCT: 0 % (ref 0–1)
Basophils Absolute: 0 10*3/uL (ref 0.0–0.1)
EOS ABS: 0 10*3/uL (ref 0.0–0.7)
Eosinophils Relative: 0 % (ref 0–5)
HCT: 29.2 % — ABNORMAL LOW (ref 36.0–46.0)
Hemoglobin: 9.4 g/dL — ABNORMAL LOW (ref 12.0–15.0)
Lymphocytes Relative: 14 % (ref 12–46)
Lymphs Abs: 1.2 10*3/uL (ref 0.7–4.0)
MCH: 27.3 pg (ref 26.0–34.0)
MCHC: 32.2 g/dL (ref 30.0–36.0)
MCV: 84.9 fL (ref 78.0–100.0)
MONO ABS: 0.3 10*3/uL (ref 0.1–1.0)
MONOS PCT: 4 % (ref 3–12)
NEUTROS ABS: 7 10*3/uL (ref 1.7–7.7)
NEUTROS PCT: 82 % — AB (ref 43–77)
PLATELETS: 198 10*3/uL (ref 150–400)
RBC: 3.44 MIL/uL — AB (ref 3.87–5.11)
RDW: 12.9 % (ref 11.5–15.5)
WBC: 8.6 10*3/uL (ref 4.0–10.5)

## 2015-02-12 LAB — POCT I-STAT TROPONIN I: Troponin i, poc: 0 ng/mL (ref 0.00–0.08)

## 2015-02-12 LAB — COMPREHENSIVE METABOLIC PANEL
ALT: 17 U/L (ref 0–35)
ANION GAP: 17 — AB (ref 5–15)
AST: 36 U/L (ref 0–37)
Albumin: 4.1 g/dL (ref 3.5–5.2)
Alkaline Phosphatase: 80 U/L (ref 39–117)
BUN: 34 mg/dL — AB (ref 6–23)
CALCIUM: 9.6 mg/dL (ref 8.4–10.5)
CO2: 22 mmol/L (ref 19–32)
Chloride: 95 mmol/L — ABNORMAL LOW (ref 96–112)
Creatinine, Ser: 2.66 mg/dL — ABNORMAL HIGH (ref 0.50–1.10)
GFR calc Af Amer: 27 mL/min — ABNORMAL LOW (ref >90)
GFR calc non Af Amer: 23 mL/min — ABNORMAL LOW (ref >90)
Glucose, Bld: 212 mg/dL — ABNORMAL HIGH (ref 70–99)
Potassium: 3.8 mmol/L (ref 3.5–5.1)
Sodium: 134 mmol/L — ABNORMAL LOW (ref 135–145)
TOTAL PROTEIN: 7.6 g/dL (ref 6.0–8.3)
Total Bilirubin: 0.6 mg/dL (ref 0.3–1.2)

## 2015-02-12 LAB — URINE MICROSCOPIC-ADD ON

## 2015-02-12 LAB — URINALYSIS, ROUTINE W REFLEX MICROSCOPIC
Bilirubin Urine: NEGATIVE
Ketones, ur: 15 mg/dL — AB
Leukocytes, UA: NEGATIVE
NITRITE: NEGATIVE
Protein, ur: 100 mg/dL — AB
Specific Gravity, Urine: 1.017 (ref 1.005–1.030)
UROBILINOGEN UA: 0.2 mg/dL (ref 0.0–1.0)
pH: 6 (ref 5.0–8.0)

## 2015-02-12 LAB — GLUCOSE, CAPILLARY: Glucose-Capillary: 585 mg/dL (ref 70–99)

## 2015-02-12 LAB — POC URINE PREG, ED: Preg Test, Ur: NEGATIVE

## 2015-02-12 MED ORDER — FERROUS SULFATE 325 (65 FE) MG PO TABS
325.0000 mg | ORAL_TABLET | Freq: Three times a day (TID) | ORAL | Status: DC
  Filled 2015-02-12 (×4): qty 1

## 2015-02-12 MED ORDER — GABAPENTIN 300 MG PO CAPS
300.0000 mg | ORAL_CAPSULE | Freq: Every day | ORAL | Status: DC
  Administered 2015-02-12: 300 mg via ORAL
  Filled 2015-02-12 (×2): qty 1

## 2015-02-12 MED ORDER — ALBUTEROL SULFATE (2.5 MG/3ML) 0.083% IN NEBU: 2.5000 mg | INHALATION_SOLUTION | Freq: Four times a day (QID) | RESPIRATORY_TRACT | Status: DC | PRN

## 2015-02-12 MED ORDER — SODIUM CHLORIDE 0.9 % IV SOLN
INTRAVENOUS | Status: DC
  Administered 2015-02-12: 22:00:00 via INTRAVENOUS

## 2015-02-12 MED ORDER — DEXTROSE-NACL 5-0.45 % IV SOLN: INTRAVENOUS | Status: DC

## 2015-02-12 MED ORDER — MORPHINE SULFATE 2 MG/ML IJ SOLN
2.0000 mg | Freq: Once | INTRAMUSCULAR | Status: AC
  Administered 2015-02-12: 2 mg via INTRAVENOUS
  Filled 2015-02-12: qty 1

## 2015-02-12 MED ORDER — LEVOTHYROXINE SODIUM 125 MCG PO TABS
125.0000 ug | ORAL_TABLET | Freq: Every day | ORAL | Status: DC
  Filled 2015-02-12 (×2): qty 1

## 2015-02-12 MED ORDER — SODIUM CHLORIDE 0.9 % IV SOLN: INTRAVENOUS | Status: DC

## 2015-02-12 MED ORDER — HEPARIN SODIUM (PORCINE) 5000 UNIT/ML IJ SOLN
5000.0000 [IU] | Freq: Three times a day (TID) | INTRAMUSCULAR | Status: DC
  Administered 2015-02-12 – 2015-02-14 (×5): 5000 [IU] via SUBCUTANEOUS
  Filled 2015-02-12 (×8): qty 1

## 2015-02-12 MED ORDER — SODIUM CHLORIDE 0.9 % IV SOLN
INTRAVENOUS | Status: DC
  Administered 2015-02-12: 4.4 [IU]/h via INTRAVENOUS
  Filled 2015-02-12 (×2): qty 2.5

## 2015-02-12 MED ORDER — CYANOCOBALAMIN 250 MCG PO TABS
250.0000 ug | ORAL_TABLET | Freq: Every day | ORAL | Status: DC
  Administered 2015-02-14: 250 ug via ORAL
  Filled 2015-02-12 (×2): qty 1

## 2015-02-12 MED ORDER — SODIUM CHLORIDE 0.9 % IV BOLUS (SEPSIS): 500.0000 mL | Freq: Once | INTRAVENOUS | Status: DC

## 2015-02-12 MED ORDER — ONDANSETRON 4 MG PO TBDP
8.0000 mg | ORAL_TABLET | Freq: Once | ORAL | Status: AC
Start: 2015-02-12 — End: 2015-02-12
  Administered 2015-02-12: 8 mg via ORAL
  Filled 2015-02-12: qty 2

## 2015-02-12 MED ORDER — ONDANSETRON HCL 4 MG/2ML IJ SOLN
4.0000 mg | Freq: Three times a day (TID) | INTRAMUSCULAR | Status: DC | PRN
Start: 2015-02-12 — End: 2015-02-14
  Administered 2015-02-13: 4 mg via INTRAVENOUS
  Filled 2015-02-12: qty 2

## 2015-02-12 MED ORDER — TRAMADOL HCL 50 MG PO TABS: 50.0000 mg | ORAL_TABLET | Freq: Three times a day (TID) | ORAL | Status: DC | PRN

## 2015-02-12 MED ORDER — ONDANSETRON 4 MG PO TBDP
ORAL_TABLET | ORAL | Status: AC
  Filled 2015-02-12: qty 2

## 2015-02-12 MED ORDER — SODIUM CHLORIDE 0.9 % IV BOLUS (SEPSIS)
1000.0000 mL | Freq: Once | INTRAVENOUS | Status: AC
  Administered 2015-02-12: 1000 mL via INTRAVENOUS

## 2015-02-12 MED ORDER — MORPHINE SULFATE 2 MG/ML IJ SOLN
2.0000 mg | INTRAMUSCULAR | Status: DC | PRN
  Administered 2015-02-12: 2 mg via INTRAVENOUS
  Filled 2015-02-12: qty 1

## 2015-02-12 MED ORDER — PRAVASTATIN SODIUM 20 MG PO TABS
20.0000 mg | ORAL_TABLET | Freq: Every evening | ORAL | Status: DC
  Administered 2015-02-12: 20 mg via ORAL
  Filled 2015-02-12 (×2): qty 1

## 2015-02-12 MED ORDER — SODIUM CHLORIDE 0.9 % IV SOLN
INTRAVENOUS | Status: DC
  Administered 2015-02-12: 5.3 [IU]/h via INTRAVENOUS
  Filled 2015-02-12 (×2): qty 2.5

## 2015-02-12 MED ORDER — SODIUM CHLORIDE 0.9 % IV SOLN
INTRAVENOUS | Status: DC
  Administered 2015-02-13: 04:00:00 via INTRAVENOUS

## 2015-02-12 MED ORDER — AMLODIPINE BESYLATE 5 MG PO TABS
5.0000 mg | ORAL_TABLET | Freq: Every day | ORAL | Status: DC
  Filled 2015-02-12: qty 1

## 2015-02-12 MED ORDER — METOCLOPRAMIDE HCL 5 MG/ML IJ SOLN
10.0000 mg | Freq: Three times a day (TID) | INTRAMUSCULAR | Status: DC
  Administered 2015-02-13 – 2015-02-14 (×5): 10 mg via INTRAVENOUS
  Filled 2015-02-12 (×8): qty 2

## 2015-02-12 NOTE — ED Notes (Signed)
Pt drank entire cup of water.

## 2015-02-12 NOTE — ED Notes (Signed)
Pt given a cup of Ice water to drink

## 2015-02-12 NOTE — Telephone Encounter (Signed)
-----   Message from Minerva Ends, MD sent at 02/08/2015  8:41 AM EST ----- Urine culture negative  Patient may stop keflex.  Patient is due for TSH check for hypothyroidism, she can come in for blood draw order in place

## 2015-02-12 NOTE — ED Provider Notes (Signed)
29 year old female, history of diabetes as well as renal dysfunction at baseline, she presents to the hospital with a complaint of nausea vomiting and intermittent diarrhea for the last 2 weeks. She has every been seen by her family doctor as well as by her kidney doctor this week. A CT scan was ordered and showed a kidney stone on the left which was nonobstructing and 2 mm in size. She presents today because of ongoing nausea vomiting and pain in that left side. On exam she has clear heart sounds, clear lung sounds, mild tachycardia, no peripheral edema, mild tenderness in the left upper and left lateral abdomen, no CVA tenderness. Her urinalysis and other labs are reviewed below, ultrasound also reviewed, no concerning findings, she does have a slight anion gap and a slight renal dysfunction compared to normal but this is likely related to her hydration status. She has been given 2 L of IV fluids and will have a by mouth trial prior to being discharged. Otherwise there does not appear any new conditions that would be causing her symptoms other than hyperglycemia and potential DKA. The patient is in agreement with the plan and feels well at this time.  The patient has progressive and continual tachycardia, she has ongoing ill appearance, her labs reveal that she is becoming more hyperglycemic and her anion gap is increasing despite fluid resuscitation, insulin will be ordered, insulin drip, admit to the hospital, the patient has diabetic ketoacidosis.  Critical care delivered.  CRITICAL CARE Performed by: Johnna Acosta Total critical care time: 35 Critical care time was exclusive of separately billable procedures and treating other patients. Critical care was necessary to treat or prevent imminent or life-threatening deterioration. Critical care was time spent personally by me on the following activities: development of treatment plan with patient and/or surrogate as well as nursing, discussions with  consultants, evaluation of patient's response to treatment, examination of patient, obtaining history from patient or surrogate, ordering and performing treatments and interventions, ordering and review of laboratory studies, ordering and review of radiographic studies, pulse oximetry and re-evaluation of patient's condition.   Results for orders placed or performed during the hospital encounter of 02/12/15  CBC with Differential  Result Value Ref Range   WBC 8.6 4.0 - 10.5 K/uL   RBC 3.44 (L) 3.87 - 5.11 MIL/uL   Hemoglobin 9.4 (L) 12.0 - 15.0 g/dL   HCT 29.2 (L) 36.0 - 46.0 %   MCV 84.9 78.0 - 100.0 fL   MCH 27.3 26.0 - 34.0 pg   MCHC 32.2 30.0 - 36.0 g/dL   RDW 12.9 11.5 - 15.5 %   Platelets 198 150 - 400 K/uL   Neutrophils Relative % 82 (H) 43 - 77 %   Neutro Abs 7.0 1.7 - 7.7 K/uL   Lymphocytes Relative 14 12 - 46 %   Lymphs Abs 1.2 0.7 - 4.0 K/uL   Monocytes Relative 4 3 - 12 %   Monocytes Absolute 0.3 0.1 - 1.0 K/uL   Eosinophils Relative 0 0 - 5 %   Eosinophils Absolute 0.0 0.0 - 0.7 K/uL   Basophils Relative 0 0 - 1 %   Basophils Absolute 0.0 0.0 - 0.1 K/uL  Comprehensive metabolic panel  Result Value Ref Range   Sodium 134 (L) 135 - 145 mmol/L   Potassium 3.8 3.5 - 5.1 mmol/L   Chloride 95 (L) 96 - 112 mmol/L   CO2 22 19 - 32 mmol/L   Glucose, Bld 212 (H) 70 -  99 mg/dL   BUN 34 (H) 6 - 23 mg/dL   Creatinine, Ser 2.66 (H) 0.50 - 1.10 mg/dL   Calcium 9.6 8.4 - 10.5 mg/dL   Total Protein 7.6 6.0 - 8.3 g/dL   Albumin 4.1 3.5 - 5.2 g/dL   AST 36 0 - 37 U/L   ALT 17 0 - 35 U/L   Alkaline Phosphatase 80 39 - 117 U/L   Total Bilirubin 0.6 0.3 - 1.2 mg/dL   GFR calc non Af Amer 23 (L) >90 mL/min   GFR calc Af Amer 27 (L) >90 mL/min   Anion gap 17 (H) 5 - 15  Urinalysis, Routine w reflex microscopic  Result Value Ref Range   Color, Urine YELLOW YELLOW   APPearance CLEAR CLEAR   Specific Gravity, Urine 1.017 1.005 - 1.030   pH 6.0 5.0 - 8.0   Glucose, UA >1000 (A)  NEGATIVE mg/dL   Hgb urine dipstick MODERATE (A) NEGATIVE   Bilirubin Urine NEGATIVE NEGATIVE   Ketones, ur 15 (A) NEGATIVE mg/dL   Protein, ur 100 (A) NEGATIVE mg/dL   Urobilinogen, UA 0.2 0.0 - 1.0 mg/dL   Nitrite NEGATIVE NEGATIVE   Leukocytes, UA NEGATIVE NEGATIVE  Urine microscopic-add on  Result Value Ref Range   Squamous Epithelial / LPF FEW (A) RARE   WBC, UA 0-2 <3 WBC/hpf   RBC / HPF 3-6 <3 RBC/hpf   Bacteria, UA FEW (A) RARE  Basic metabolic panel  Result Value Ref Range   Sodium 132 (L) 135 - 145 mmol/L   Potassium 5.3 (H) 3.5 - 5.1 mmol/L   Chloride 95 (L) 96 - 112 mmol/L   CO2 18 (L) 19 - 32 mmol/L   Glucose, Bld 508 (H) 70 - 99 mg/dL   BUN 34 (H) 6 - 23 mg/dL   Creatinine, Ser 2.47 (H) 0.50 - 1.10 mg/dL   Calcium 8.6 8.4 - 10.5 mg/dL   GFR calc non Af Amer 25 (L) >90 mL/min   GFR calc Af Amer 29 (L) >90 mL/min   Anion gap 19 (H) 5 - 15  Glucose, capillary  Result Value Ref Range   Glucose-Capillary 585 (HH) 70 - 99 mg/dL  Glucose, capillary  Result Value Ref Range   Glucose-Capillary 591 (HH) 70 - 99 mg/dL   Comment 1 Documented in Char   Glucose, capillary  Result Value Ref Range   Glucose-Capillary >600 (HH) 70 - 99 mg/dL  POC Urine Pregnancy, ED  (If Pre-menopausal female) - do not order at [facility]  Result Value Ref Range   Preg Test, Ur NEGATIVE NEGATIVE  CBG monitoring, ED  Result Value Ref Range   Glucose-Capillary 440 (H) 70 - 99 mg/dL   Comment 1 Notify RN    Comment 2 Documented in Char   CBG monitoring, ED  Result Value Ref Range   Glucose-Capillary 496 (H) 70 - 99 mg/dL  I-stat chem 8, ed  Result Value Ref Range   Sodium 129 (L) 135 - 145 mmol/L   Potassium 6.4 (HH) 3.5 - 5.1 mmol/L   Chloride 97 96 - 112 mmol/L   BUN 32 (H) 6 - 23 mg/dL   Creatinine, Ser 2.20 (H) 0.50 - 1.10 mg/dL   Glucose, Bld 654 (HH) 70 - 99 mg/dL   Calcium, Ion 1.11 (L) 1.12 - 1.23 mmol/L   TCO2 16 0 - 100 mmol/L   Hemoglobin 10.5 (L) 12.0 - 15.0 g/dL   HCT 31.0  (L) 36.0 - 46.0 %  Comment NOTIFIED PHYSICIAN   POCT i-Stat troponin I  Result Value Ref Range   Troponin i, poc 0.00 0.00 - 0.08 ng/mL   Comment 3           Ct Abdomen Pelvis Wo Contrast  02/05/2015   CLINICAL DATA:  Bilateral flank pain  EXAM: CT ABDOMEN AND PELVIS WITHOUT CONTRAST  TECHNIQUE: Multidetector CT imaging of the abdomen and pelvis was performed following the standard protocol without IV contrast.  COMPARISON:  05/01/2008, renal ultrasound 01/02/2015  FINDINGS: Lower chest:  Lung bases are clear.  Hepatobiliary: Unenhanced liver and gallbladder appear normal.  Pancreas: Normal  Spleen: Normal  Adrenals/Urinary Tract: 2 mm nonobstructing left mid renal calculus image 22. No radiopaque right renal calculus. Adrenal glands appear normal. No hydroureteronephrosis. Bladder wall thickness is at upper limits of normal concentrically without focal abnormality.  Stomach/Bowel: No bowel wall thickening or focal segmental dilatation is identified. Stomach appears normal. Appendix is normal.  Vascular/Lymphatic: No aortic aneurysm.  No lymphadenopathy.  Other: Left lateral superficial skin thickening and mild subcutaneous fat stranding is evident, image 38. No drainable fluid collection is identified.  Trace pelvic free fluid is likely physiologic. Uterus and ovaries are normal.  Musculoskeletal: No acute osseous abnormality.  IMPRESSION: No acute intra-abdominal or pelvic pathology. Nonobstructing left renal calculus.  Left lateral abdominal wall focal skin thickening and minimal subcutaneous stranding which is nonspecific but may be seen with cellulitis or ecchymosis. Correlate clinically.   Electronically Signed   By: Conchita Paris M.D.   On: 02/05/2015 19:19   US Renal  02/12/2015   CLINICAL DATA:  29 year old female with nausea vomiting diarrhea. Stage III chronic kidney disease. Nephrolithiasis. Initial encounter.  EXAM: RENAL/URINARY TRACT ULTRASOUND COMPLETE  COMPARISON:  Noncontrast CT  Abdomen and Pelvis 02/05/2015.  FINDINGS: Right Kidney:  Length: 11.4 cm. Echogenicity within normal limits. No mass or hydronephrosis visualized.  Left Kidney:  Length: 10.5 cm. Echogenicity within normal limits. No mass or hydronephrosis visualized.  Bladder:  Mild bladder wall thickening, stable compared to 02/05/2015. No echogenic debris within the bladder.  IMPRESSION: 1. Negative sonographic appearance of the kidneys. Punctate nephrolithiasis seen recently by a CT Abdomen and Pelvis is not evident sonographically. 2. Stable bladder wall thickening.  No urinary debris identified.   Electronically Signed   By: [name] M.D.   On: 02/12/2015 17:56   Medical screening examination/treatment/procedure(s) were conducted as a shared visit with non-physician practitioner(s) and myself.  I personally evaluated the patient during the encounter.  Clinical Impression:   Final diagnoses:  Dehydration  Diabetic ketoacidosis without coma associated with type 1 diabetes mellitus         Johnna Acosta, MD 02/13/15 0110

## 2015-02-12 NOTE — ED Notes (Signed)
Pt with nausea, vomiting, diarrhea x 2 weeks.  Was told she had a kidney stone last week.  Pt sts she continues to have nvd and was told to follow up here if symptoms persisted.

## 2015-02-12 NOTE — ED Notes (Signed)
Pt left for US

## 2015-02-12 NOTE — ED Notes (Signed)
Attempted report X2 
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
## 2015-02-12 NOTE — Telephone Encounter (Signed)
Left voice message to return call 

## 2015-02-12 NOTE — ED Notes (Signed)
Attempted report X1

## 2015-02-12 NOTE — ED Notes (Signed)
CBG 496 
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
## 2015-02-12 NOTE — ED Notes (Signed)
Glucose stabilzer stopped by admitting physician

## 2015-02-13 ENCOUNTER — Inpatient Hospital Stay (HOSPITAL_COMMUNITY): Payer: Self-pay

## 2015-02-13 DIAGNOSIS — E872 Acidosis: Secondary | ICD-10-CM

## 2015-02-13 DIAGNOSIS — E0810 Diabetes mellitus due to underlying condition with ketoacidosis without coma: Secondary | ICD-10-CM

## 2015-02-13 DIAGNOSIS — E875 Hyperkalemia: Secondary | ICD-10-CM

## 2015-02-13 DIAGNOSIS — E611 Iron deficiency: Secondary | ICD-10-CM | POA: Insufficient documentation

## 2015-02-13 DIAGNOSIS — E1011 Type 1 diabetes mellitus with ketoacidosis with coma: Secondary | ICD-10-CM

## 2015-02-13 LAB — GLUCOSE, CAPILLARY
GLUCOSE-CAPILLARY: 560 mg/dL — AB (ref 70–99)
GLUCOSE-CAPILLARY: 440 mg/dL — AB (ref 70–99)
GLUCOSE-CAPILLARY: 519 mg/dL — AB (ref 70–99)
GLUCOSE-CAPILLARY: 323 mg/dL — AB (ref 70–99)
GLUCOSE-CAPILLARY: 202 mg/dL — AB (ref 70–99)
GLUCOSE-CAPILLARY: 100 mg/dL — AB (ref 70–99)
Glucose-Capillary: 104 mg/dL — ABNORMAL HIGH (ref 70–99)
Glucose-Capillary: 131 mg/dL — ABNORMAL HIGH (ref 70–99)
Glucose-Capillary: 136 mg/dL — ABNORMAL HIGH (ref 70–99)
Glucose-Capillary: 196 mg/dL — ABNORMAL HIGH (ref 70–99)
Glucose-Capillary: 277 mg/dL — ABNORMAL HIGH (ref 70–99)
Glucose-Capillary: 394 mg/dL — ABNORMAL HIGH (ref 70–99)
Glucose-Capillary: 591 mg/dL (ref 70–99)
Glucose-Capillary: >600 mg/dL (ref 70–99)
Glucose-Capillary: >600 mg/dL (ref 70–99)
Glucose-Capillary: >600 mg/dL (ref 70–99)
Glucose-Capillary: >600 mg/dL (ref 70–99)
Glucose-Capillary: >600 mg/dL (ref 70–99)

## 2015-02-13 LAB — BASIC METABOLIC PANEL
ANION GAP: 11 (ref 5–15)
ANION GAP: 9 (ref 5–15)
Anion gap: 22 — ABNORMAL HIGH (ref 5–15)
Anion gap: 24 — ABNORMAL HIGH (ref 5–15)
Anion gap: 24 — ABNORMAL HIGH (ref 5–15)
Anion gap: 29 — ABNORMAL HIGH (ref 5–15)
Anion gap: 19 — ABNORMAL HIGH (ref 5–15)
Anion gap: 9 (ref 5–15)
BUN: 37 mg/dL — ABNORMAL HIGH (ref 6–23)
BUN: 40 mg/dL — ABNORMAL HIGH (ref 6–23)
BUN: 41 mg/dL — AB (ref 6–23)
BUN: 36 mg/dL — ABNORMAL HIGH (ref 6–23)
BUN: 30 mg/dL — ABNORMAL HIGH (ref 6–23)
BUN: 28 mg/dL — AB (ref 6–23)
BUN: 36 mg/dL — AB (ref 6–23)
BUN: 36 mg/dL — AB (ref 6–23)
CALCIUM: 7.9 mg/dL — AB (ref 8.4–10.5)
CALCIUM: 8.4 mg/dL (ref 8.4–10.5)
CHLORIDE: 94 mmol/L — AB (ref 96–112)
CHLORIDE: 96 mmol/L (ref 96–112)
CHLORIDE: 104 mmol/L (ref 96–112)
CO2: 14 mmol/L — ABNORMAL LOW (ref 19–32)
CO2: 15 mmol/L — AB (ref 19–32)
CO2: 21 mmol/L (ref 19–32)
CO2: 26 mmol/L (ref 19–32)
CO2: 24 mmol/L (ref 19–32)
CO2: 11 mmol/L — AB (ref 19–32)
CO2: 7 mmol/L — AB (ref 19–32)
CO2: 9 mmol/L — AB (ref 19–32)
CREATININE: 2.67 mg/dL — AB (ref 0.50–1.10)
CREATININE: 2.81 mg/dL — AB (ref 0.50–1.10)
CREATININE: 3.03 mg/dL — AB (ref 0.50–1.10)
CREATININE: 3.19 mg/dL — AB (ref 0.50–1.10)
CREATININE: 3.34 mg/dL — AB (ref 0.50–1.10)
Calcium: 8.1 mg/dL — ABNORMAL LOW (ref 8.4–10.5)
Calcium: 8.1 mg/dL — ABNORMAL LOW (ref 8.4–10.5)
Calcium: 8.8 mg/dL (ref 8.4–10.5)
Calcium: 8.7 mg/dL (ref 8.4–10.5)
Calcium: 9.2 mg/dL (ref 8.4–10.5)
Calcium: 8.7 mg/dL (ref 8.4–10.5)
Chloride: 94 mmol/L — ABNORMAL LOW (ref 96–112)
Chloride: 95 mmol/L — ABNORMAL LOW (ref 96–112)
Chloride: 111 mmol/L (ref 96–112)
Chloride: 114 mmol/L — ABNORMAL HIGH (ref 96–112)
Chloride: 114 mmol/L — ABNORMAL HIGH (ref 96–112)
Creatinine, Ser: 3.42 mg/dL — ABNORMAL HIGH (ref 0.50–1.10)
Creatinine, Ser: 3.03 mg/dL — ABNORMAL HIGH (ref 0.50–1.10)
Creatinine, Ser: 2.55 mg/dL — ABNORMAL HIGH (ref 0.50–1.10)
GFR calc Af Amer: 25 mL/min — ABNORMAL LOW (ref >90)
GFR calc non Af Amer: 20 mL/min — ABNORMAL LOW (ref >90)
GFR calc non Af Amer: 19 mL/min — ABNORMAL LOW (ref >90)
GFR calc non Af Amer: 17 mL/min — ABNORMAL LOW (ref >90)
GFR, EST AFRICAN AMERICAN: 23 mL/min — AB (ref >90)
GFR, EST AFRICAN AMERICAN: 21 mL/min — AB (ref >90)
GFR, EST AFRICAN AMERICAN: 20 mL/min — AB (ref >90)
GFR, EST AFRICAN AMERICAN: 20 mL/min — AB (ref >90)
GFR, EST AFRICAN AMERICAN: 23 mL/min — AB (ref >90)
GFR, EST AFRICAN AMERICAN: 27 mL/min — AB (ref >90)
GFR, EST AFRICAN AMERICAN: 28 mL/min — AB (ref >90)
GFR, EST NON AFRICAN AMERICAN: 20 mL/min — AB (ref >90)
GFR, EST NON AFRICAN AMERICAN: 23 mL/min — AB (ref >90)
GFR, EST NON AFRICAN AMERICAN: 24 mL/min — AB (ref >90)
GFR, EST NON AFRICAN AMERICAN: 18 mL/min — AB (ref >90)
GFR, EST NON AFRICAN AMERICAN: 22 mL/min — AB (ref >90)
GLUCOSE: 812 mg/dL — AB (ref 70–99)
GLUCOSE: 155 mg/dL — AB (ref 70–99)
Glucose, Bld: 763 mg/dL (ref 70–99)
Glucose, Bld: 890 mg/dL (ref 70–99)
Glucose, Bld: 945 mg/dL (ref 70–99)
Glucose, Bld: 534 mg/dL — ABNORMAL HIGH (ref 70–99)
Glucose, Bld: 210 mg/dL — ABNORMAL HIGH (ref 70–99)
Glucose, Bld: 107 mg/dL — ABNORMAL HIGH (ref 70–99)
POTASSIUM: 2.9 mmol/L — AB (ref 3.5–5.1)
POTASSIUM: 3.5 mmol/L (ref 3.5–5.1)
POTASSIUM: 5.3 mmol/L — AB (ref 3.5–5.1)
POTASSIUM: 5.5 mmol/L — AB (ref 3.5–5.1)
POTASSIUM: 6.4 mmol/L — AB (ref 3.5–5.1)
POTASSIUM: 6.6 mmol/L — AB (ref 3.5–5.1)
Potassium: 6.5 mmol/L (ref 3.5–5.1)
Potassium: 4.3 mmol/L (ref 3.5–5.1)
SODIUM: 129 mmol/L — AB (ref 135–145)
SODIUM: 129 mmol/L — AB (ref 135–145)
SODIUM: 131 mmol/L — AB (ref 135–145)
SODIUM: 143 mmol/L (ref 135–145)
Sodium: 130 mmol/L — ABNORMAL LOW (ref 135–145)
Sodium: 138 mmol/L (ref 135–145)
Sodium: 149 mmol/L — ABNORMAL HIGH (ref 135–145)
Sodium: 147 mmol/L — ABNORMAL HIGH (ref 135–145)

## 2015-02-13 LAB — BLOOD GAS, ARTERIAL
Acid-base deficit: 21.1 mmol/L — ABNORMAL HIGH (ref 0.0–2.0)
Bicarbonate: 6.9 mEq/L — ABNORMAL LOW (ref 20.0–24.0)
Drawn by: 275531
FIO2: 0.21 %
O2 SAT: 96.7 %
Patient temperature: 98.6
TCO2: 7.6 mmol/L (ref 0–100)
pCO2 arterial: 23.8 mmHg — ABNORMAL LOW (ref 35.0–45.0)
pH, Arterial: 7.09 — CL (ref 7.350–7.450)
pO2, Arterial: 118 mmHg — ABNORMAL HIGH (ref 80.0–100.0)

## 2015-02-13 LAB — RAPID URINE DRUG SCREEN, HOSP PERFORMED
AMPHETAMINES: NOT DETECTED
Barbiturates: NOT DETECTED
Benzodiazepines: NOT DETECTED
COCAINE: NOT DETECTED
Opiates: POSITIVE — AB
TETRAHYDROCANNABINOL: POSITIVE — AB

## 2015-02-13 LAB — MRSA PCR SCREENING: MRSA by PCR: NEGATIVE

## 2015-02-13 LAB — TROPONIN I
Troponin I: <0.03 ng/mL (ref <0.031)
Troponin I: 0.03 ng/mL (ref <0.031)

## 2015-02-13 LAB — SALICYLATE LEVEL: Salicylate Lvl: <4 mg/dL (ref 2.8–20.0)

## 2015-02-13 LAB — MAGNESIUM: Magnesium: 1.9 mg/dL (ref 1.5–2.5)

## 2015-02-13 LAB — LACTIC ACID, PLASMA
Lactic Acid, Venous: 1.8 mmol/L (ref 0.5–2.0)
Lactic Acid, Venous: 5.9 mmol/L (ref 0.5–2.0)

## 2015-02-13 LAB — ETHANOL: Alcohol, Ethyl (B): <5 mg/dL (ref 0–9)

## 2015-02-13 MED ORDER — CALCIUM CHLORIDE 10 % IV SOLN
1.0000 g | Freq: Once | INTRAVENOUS | Status: AC
  Administered 2015-02-13: 1 g via INTRAVENOUS
  Filled 2015-02-13: qty 10

## 2015-02-13 MED ORDER — SODIUM BICARBONATE 8.4 % IV SOLN
50.0000 meq | Freq: Once | INTRAVENOUS | Status: DC
  Filled 2015-02-13: qty 50

## 2015-02-13 MED ORDER — INSULIN ASPART 100 UNIT/ML IV SOLN
10.0000 [IU] | Freq: Once | INTRAVENOUS | Status: AC
  Administered 2015-02-13: 10 [IU] via INTRAVENOUS
  Filled 2015-02-13: qty 0.1

## 2015-02-13 MED ORDER — INSULIN ASPART 100 UNIT/ML ~~LOC~~ SOLN
0.0000 [IU] | Freq: Three times a day (TID) | SUBCUTANEOUS | Status: DC
  Administered 2015-02-13 – 2015-02-14 (×2): 2 [IU] via SUBCUTANEOUS

## 2015-02-13 MED ORDER — SODIUM BICARBONATE 8.4 % IV SOLN
INTRAVENOUS | Status: DC
  Administered 2015-02-13: 08:00:00 via INTRAVENOUS
  Filled 2015-02-13 (×2): qty 850

## 2015-02-13 MED ORDER — SODIUM POLYSTYRENE SULFONATE 15 GM/60ML PO SUSP
30.0000 g | Freq: Once | ORAL | Status: AC
  Administered 2015-02-13: 30 g via ORAL
  Filled 2015-02-13: qty 120

## 2015-02-13 MED ORDER — SODIUM BICARBONATE 8.4 % IV SOLN
100.0000 meq | Freq: Once | INTRAVENOUS | Status: AC
  Administered 2015-02-13: 100 meq via INTRAVENOUS

## 2015-02-13 MED ORDER — SODIUM POLYSTYRENE SULFONATE 15 GM/60ML PO SUSP
60.0000 g | Freq: Once | ORAL | Status: AC
  Administered 2015-02-13: 60 g via ORAL
  Filled 2015-02-13: qty 240

## 2015-02-13 MED ORDER — DEXTROSE-NACL 5-0.45 % IV SOLN
INTRAVENOUS | Status: DC
  Administered 2015-02-13 – 2015-02-14 (×2): via INTRAVENOUS

## 2015-02-13 MED ORDER — SODIUM CHLORIDE 0.9 % IV BOLUS (SEPSIS)
1000.0000 mL | Freq: Once | INTRAVENOUS | Status: AC
  Administered 2015-02-13: 1000 mL via INTRAVENOUS

## 2015-02-13 MED ORDER — LEVOTHYROXINE SODIUM 100 MCG IV SOLR
62.5000 ug | Freq: Every day | INTRAVENOUS | Status: DC
  Administered 2015-02-13 – 2015-02-14 (×2): 62.5 ug via INTRAVENOUS
  Filled 2015-02-13 (×2): qty 5

## 2015-02-13 MED ORDER — SODIUM CHLORIDE 0.9 % IV BOLUS (SEPSIS): 1000.0000 mL | Freq: Once | INTRAVENOUS | Status: DC

## 2015-02-13 MED ORDER — SODIUM BICARBONATE 8.4 % IV SOLN: INTRAVENOUS | Status: DC

## 2015-02-13 MED ORDER — PROMETHAZINE HCL 25 MG/ML IJ SOLN
25.0000 mg | Freq: Four times a day (QID) | INTRAMUSCULAR | Status: DC | PRN
  Administered 2015-02-13: 25 mg via INTRAVENOUS
  Filled 2015-02-13: qty 1

## 2015-02-13 MED ORDER — INSULIN ASPART 100 UNIT/ML ~~LOC~~ SOLN: 0.0000 [IU] | Freq: Every day | SUBCUTANEOUS | Status: DC

## 2015-02-13 MED ORDER — INSULIN GLARGINE 100 UNIT/ML ~~LOC~~ SOLN
19.0000 [IU] | SUBCUTANEOUS | Status: DC
  Administered 2015-02-13: 19 [IU] via SUBCUTANEOUS
  Filled 2015-02-13 (×3): qty 0.19

## 2015-02-13 MED ORDER — INSULIN ASPART 100 UNIT/ML ~~LOC~~ SOLN
10.0000 [IU] | Freq: Once | SUBCUTANEOUS | Status: AC
  Administered 2015-02-13: 10 [IU] via SUBCUTANEOUS

## 2015-02-13 MED ORDER — POTASSIUM CHLORIDE 10 MEQ/100ML IV SOLN
10.0000 meq | INTRAVENOUS | Status: AC
  Administered 2015-02-13 (×2): 10 meq via INTRAVENOUS
  Filled 2015-02-13 (×2): qty 100

## 2015-02-13 MED ORDER — SODIUM CHLORIDE 0.9 % IV SOLN
INTRAVENOUS | Status: DC
  Administered 2015-02-13: 10:00:00 via INTRAVENOUS

## 2015-02-13 MED ORDER — POTASSIUM CHLORIDE 20 MEQ/15ML (10%) PO SOLN
40.0000 meq | ORAL | Status: DC
  Administered 2015-02-13 (×2): 40 meq via ORAL
  Filled 2015-02-13 (×5): qty 30

## 2015-02-13 MED ORDER — PANTOPRAZOLE SODIUM 40 MG IV SOLR
40.0000 mg | INTRAVENOUS | Status: DC
  Administered 2015-02-13 – 2015-02-14 (×2): 40 mg via INTRAVENOUS
  Filled 2015-02-13 (×3): qty 40

## 2015-02-13 MED FILL — Medication: Qty: 1 | Status: AC

## 2015-02-13 NOTE — Progress Notes (Signed)
Pharmacy notified in need of IV fluid: sterile water with bicarb stat. Awaiting arrival.

## 2015-02-13 NOTE — Plan of Care (Signed)
Problem: Phase I Progression Outcomes Goal: Voiding-avoid urinary catheter unless indicated Outcome: Completed/Met Date Met:  02/13/15 Foley d/c 02/13/15
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
## 2015-02-13 NOTE — Progress Notes (Signed)
CRITICAL VALUE ALERT  Critical value received:  Glucose = 812 and K+= 6.4  Date of notification:  02/13/2015  Time of notification:  [id number]  Critical value read back:Yes.    Nurse who received alert:  K. Royden Purl  MD notified (1st page):  Schorr  Time of first page:  [phone]  MD notified (2nd page):  Time of second page:  Responding MD:  Schorr  Time MD responded:  [phone]  No new orders received

## 2015-02-13 NOTE — Progress Notes (Signed)
Discussed potassium level with Dr Halford Chessman. 1700 potassium 5.3 No more interventions needed per Dr Halford Chessman. Hold 8pm potassium.

## 2015-02-13 NOTE — Progress Notes (Signed)
Briefly spoke to patient. Has Type 1 diabetes. Stated that she did not get any insulin until later in the evening when she arrived at hospital yesterday around 12 noon.  Had taken Novolog 12 units at home. Blood sugars began to rise. Was started on glucostabilizer last night.  CBGs >600 mg/dl. Drip rate was very high >30 units/hr.  Was restarted.  CBGs much better now.  Takes Toujeo 25 units every HS  and Novolog 12 units TID at home.  Has only been on Toujeo for a short time. Was on Levemir prior to this.  Stated was on Levemir 25 units daily at that time.  Recommend Levemir 20-25 units daily, Novolog MODERATE correction scale TID & HS, and may need Novolog meal coverage 4 units TID when eating at least 50% of meals. Will continue to follow while in hospital. Harvel Ricks RN BSN CDE

## 2015-02-13 NOTE — Progress Notes (Signed)
UR Completed.  [phone]  

## 2015-02-13 NOTE — Progress Notes (Signed)
MD Florene Glen made aware pt BG 945, K+ 6.5, and CO2 is 7.

## 2015-02-13 NOTE — Progress Notes (Signed)
PATIENT DETAILS Name: Angela [name] Age: 29 y.o. Sex: female Date of Birth: 01-11-1986 Admit Date: 02/12/2015 Admitting Physician Ivor Costa, MD [id number], Lennox Laity, MD  Subjective: Lethargic-but arousable. Denies abdominal pain.Denies hx of ETOH or other illicit Alcohol use. One tablet of ASA yesterday  Assessment/Plan: Principal Problem:   DKA (diabetic ketoacidoses):Unfortunately continues to have worsening hyperglycemia, acidosis and hyperkalemia. She is currently on 49 units of insulin via drip!! ?Etiology of worsening acidosis/hyperkalemia/hyperglycemia-her belly is benign-will check ABG, repeat Lactate, Alcohol and salicylate level.Have consulted PCCM and Renal for assistance.Follow labs.  Active Problems:   Severe Metabolic Acidosis, Hyperkalemia: worsening inspite of treatment for DKA. For now-Will push another 10 units of IV Insulin, 2 amps of HCO3 and Kayexalate. Will change IVF to sterile water with Bicarb. Degree of acidemia seems out of proportion to her renal failure-etiolog remains un-clear. Have consulted PCCM and renal.     Acute on CKD Stage 3: worsening. Place foley. Degree of acidemia is out of proportion to her renal failure.Continue Hydration    SIR's:secondary to DKA. UA/CXR neg for UTI/PNA. If Fever-culture.     Diabetic Gastroparesis:Keep NPO-Benign belly exam-last BM 2/25. Continue Reglan, prn promethazine.      Hypothyroidism:change to IV Levothyroxine while NPO    HTN:BP soft-stop Amlodipine. Monitor    Left Flank Pain:currently pain free-CT Abd on 2/18-showed 2 mm nonobstructing left mid renal calculus. Supportive care.    GERD:PPI. Worsened by vomiting.     Peripheral Neuropathy: resume neurontin once oral intake more stable    Dyslipidemia:resume statin once oral intake more stable.   Disposition: Remain inpatient  Antibiotics:  None   Anti-infectives    None      DVT Prophylaxis: Prophylactic  Heparin   Code  Status: Full code   Family Communication Friend at bedside  Procedures:  None  CONSULTS:  pulmonary/intensive care and nephrology  Time spent 40 minutes-which includes 50% of the time with face-to-face with patient/ family and coordinating care related to the above assessment and plan.  MEDICATIONS: Scheduled Meds: . ferrous sulfate  325 mg Oral TID WC  . gabapentin  300 mg Oral QHS  . heparin  5,000 Units Subcutaneous 3 times per day  . levothyroxine  125 mcg Oral QAC breakfast  . metoCLOPramide (REGLAN) injection  10 mg Intravenous 3 times per day  . pravastatin  20 mg Oral QPM  . sodium chloride  1,000 mL Intravenous Once  . sodium polystyrene  60 g Oral Once  . vitamin B-12  250 mcg Oral Daily   Continuous Infusions: . sodium chloride 150 mL/hr at 02/13/15 0343  . sodium chloride 150 mL/hr at 02/12/15 2131  . dextrose 5 % and 0.45% NaCl    . insulin (NOVOLIN-R) infusion 43.2 Units/hr (02/13/15 0552)   PRN Meds:.albuterol, morphine injection, ondansetron, promethazine, traMADol    PHYSICAL EXAM: Vital signs in last 24 hours: Filed Vitals:   02/12/15 2145 02/12/15 2250 02/13/15 0003 02/13/15 0251  BP: 128/76 127/69  110/50  Pulse: 102 99  111  Temp:  98.7 F (37.1 C) 97.8 F (36.6 C) 98.3 F (36.8 C)  TempSrc:  Axillary Axillary Oral  Resp:  11 16 16   Height:  5\' 5"  (1.651 m)    Weight:  65.3 kg (143 lb 15.4 oz)    SpO2: 99% 99%  100%    Weight change:  Filed Weights   02/12/15 2250  Weight: 65.3 kg (143 lb 15.4 oz)  Body mass index is 23.96 kg/(m^2).   Gen Exam: Awake and alert-but slightly lethargic-has clear speech.   Neck: Supple, No JVD.   Chest: B/L Clear.   CVS: S1 S2 Regular, no murmurs. Tachycardic Abdomen: soft, BS +, non tender, non distended.  Extremities: no edema, lower extremities warm to touch. Neurologic: Non Focal.   Skin: No Rash.   Wounds: N/A.    Intake/Output from previous day:  Intake/Output Summary (Last 24 hours) at  02/13/15 0739 Last data filed at 02/13/15 0500  Gross per 24 hour  Intake    450 ml  Output   1300 ml  Net   -850 ml     LAB RESULTS: CBC  Recent Labs Lab 02/12/15 1352 02/12/15 2223  WBC 8.6  --   HGB 9.4* 10.5*  HCT 29.2* 31.0*  PLT 198  --   MCV 84.9  --   MCH 27.3  --   MCHC 32.2  --   RDW 12.9  --   LYMPHSABS 1.2  --   MONOABS 0.3  --   EOSABS 0.0  --   BASOSABS 0.0  --     Chemistries   Recent Labs Lab 02/12/15 1352 02/12/15 1930 02/12/15 2223 02/13/15 0039 02/13/15 0232 02/13/15 0508  NA 134* 132* 129* 130* 129* 129*  K 3.8 5.3* 6.4* 5.5* 6.4* 6.6*  CL 95* 95* 97 94* 94* 96  CO2 22 18*  --  14* 11* 9*  GLUCOSE 212* 508* 654* 763* 812* 890*  BUN 34* 34* 32* 36* 36* 37*  CREATININE 2.66* 2.47* 2.20* 2.81* 3.03* 3.19*  CALCIUM 9.6 8.6  --  8.4 8.1* 8.1*  MG  --   --   --  1.9  --   --     CBG:  Recent Labs Lab 02/13/15 0056 02/13/15 0156 02/13/15 0248 02/13/15 0351 02/13/15 0457  GLUCAP >600* >600* >600* >600* >600*    GFR Estimated Creatinine Clearance: 23.4 mL/min (by C-G formula based on Cr of 3.19).  Coagulation profile No results for input(s): INR, PROTIME in the last 168 hours.  Cardiac Enzymes  Recent Labs Lab 02/13/15 0039 02/13/15 0508  TROPONINI <0.03 <0.03    Invalid input(s): POCBNP No results for input(s): DDIMER in the last 72 hours. No results for input(s): HGBA1C in the last 72 hours. No results for input(s): CHOL, HDL, LDLCALC, TRIG, CHOLHDL, LDLDIRECT in the last 72 hours. No results for input(s): TSH, T4TOTAL, T3FREE, THYROIDAB in the last 72 hours.  Invalid input(s): FREET3 No results for input(s): VITAMINB12, FOLATE, FERRITIN, TIBC, IRON, RETICCTPCT in the last 72 hours. No results for input(s): LIPASE, AMYLASE in the last 72 hours.  Urine Studies No results for input(s): UHGB, CRYS in the last 72 hours.  Invalid input(s): UACOL, UAPR, USPG, UPH, UTP, UGL, UKET, UBIL, UNIT, UROB, ULEU, UEPI, UWBC,  URBC, UBAC, CAST, UCOM, BILUA  MICROBIOLOGY: Recent Results (from the past 240 hour(s))  Urine culture     Status: None   Collection Time: 02/05/15 12:50 PM  Result Value Ref Range Status   Colony Count NO GROWTH  Final   Organism ID, Bacteria NO GROWTH  Final  MRSA PCR Screening     Status: None   Collection Time: 02/12/15 10:45 PM  Result Value Ref Range Status   MRSA by PCR NEGATIVE NEGATIVE Final    Comment:        The GeneXpert MRSA Assay (FDA approved for NASAL specimens only), is one component of a comprehensive  MRSA colonization surveillance program. It is not intended to diagnose MRSA infection nor to guide or monitor treatment for MRSA infections.   Culture, blood (routine x 2)     Status: None (Preliminary result)   Collection Time: 02/13/15 12:39 AM  Result Value Ref Range Status   Specimen Description BLOOD RIGHT ARM  Final   Special Requests BOTTLES DRAWN AEROBIC AND ANAEROBIC 8CC  Final   Culture PENDING  Incomplete   Report Status PENDING  Incomplete  Culture, blood (routine x 2)     Status: None (Preliminary result)   Collection Time: 02/13/15 12:46 AM  Result Value Ref Range Status   Specimen Description BLOOD RIGHT HAND  Final   Special Requests BOTTLES DRAWN AEROBIC ONLY 4CC  Final   Culture PENDING  Incomplete   Report Status PENDING  Incomplete    RADIOLOGY STUDIES/RESULTS: Ct Abdomen Pelvis Wo Contrast  02/05/2015   CLINICAL DATA:  Bilateral flank pain  EXAM: CT ABDOMEN AND PELVIS WITHOUT CONTRAST  TECHNIQUE: Multidetector CT imaging of the abdomen and pelvis was performed following the standard protocol without IV contrast.  COMPARISON:  05/01/2008, renal ultrasound 01/02/2015  FINDINGS: Lower chest:  Lung bases are clear.  Hepatobiliary: Unenhanced liver and gallbladder appear normal.  Pancreas: Normal  Spleen: Normal  Adrenals/Urinary Tract: 2 mm nonobstructing left mid renal calculus image 22. No radiopaque right renal calculus. Adrenal glands  appear normal. No hydroureteronephrosis. Bladder wall thickness is at upper limits of normal concentrically without focal abnormality.  Stomach/Bowel: No bowel wall thickening or focal segmental dilatation is identified. Stomach appears normal. Appendix is normal.  Vascular/Lymphatic: No aortic aneurysm.  No lymphadenopathy.  Other: Left lateral superficial skin thickening and mild subcutaneous fat stranding is evident, image 38. No drainable fluid collection is identified.  Trace pelvic free fluid is likely physiologic. Uterus and ovaries are normal.  Musculoskeletal: No acute osseous abnormality.  IMPRESSION: No acute intra-abdominal or pelvic pathology. Nonobstructing left renal calculus.  Left lateral abdominal wall focal skin thickening and minimal subcutaneous stranding which is nonspecific but may be seen with cellulitis or ecchymosis. Correlate clinically.   Electronically Signed   By: Conchita Paris M.D.   On: 02/05/2015 19:19   US Renal  02/12/2015   CLINICAL DATA:  29 year old female with nausea vomiting diarrhea. Stage III chronic kidney disease. Nephrolithiasis. Initial encounter.  EXAM: RENAL/URINARY TRACT ULTRASOUND COMPLETE  COMPARISON:  Noncontrast CT Abdomen and Pelvis 02/05/2015.  FINDINGS: Right Kidney:  Length: 11.4 cm. Echogenicity within normal limits. No mass or hydronephrosis visualized.  Left Kidney:  Length: 10.5 cm. Echogenicity within normal limits. No mass or hydronephrosis visualized.  Bladder:  Mild bladder wall thickening, stable compared to 02/05/2015. No echogenic debris within the bladder.  IMPRESSION: 1. Negative sonographic appearance of the kidneys. Punctate nephrolithiasis seen recently by a CT Abdomen and Pelvis is not evident sonographically. 2. Stable bladder wall thickening.  No urinary debris identified.   Electronically Signed   By: [name] M.D.   On: 02/12/2015 17:56    Oren Binet, MD  [facility] Pager:[phone]  If 7PM-7AM, please contact  night-coverage [email or web address] Password [facility] 02/13/2015, 7:39 AM   LOS: 1 day

## 2015-02-13 NOTE — Procedures (Signed)
Central Venous Catheter Insertion Procedure Note Angela [name] [id number] [birth year]  Procedure: Insertion of Central Venous Catheter Indications: Drug and/or fluid administration and Frequent blood sampling  Procedure Details Consent: Risks of procedure as well as the alternatives and risks of each were explained to the (patient/caregiver).  Consent for procedure obtained. Time Out: Verified patient identification, verified procedure, site/side was marked, verified correct patient position, special equipment/implants available, medications/allergies/relevent history reviewed, required imaging and test results available.  Performed  Maximum sterile technique was used including antiseptics, cap, gloves, gown, hand hygiene, mask and sheet. Skin prep: Chlorhexidine; local anesthetic administered A antimicrobial bonded/coated triple lumen catheter was placed in the right subclavian vein using the Seldinger technique.  Evaluation Blood flow good Complications: No apparent complications Patient did tolerate procedure well. Chest X-ray ordered to verify placement.  CXR: pending.  BABCOCK,PETE 02/13/2015, 9:58 AM  I was present for procedure.  Chesley Mires, MD [facility] Pulmonary/Critical Care 02/13/2015, 10:07 AM Pager:  [phone] After 3pm call: [phone]

## 2015-02-13 NOTE — Progress Notes (Signed)
Notified Schorr of CBG trend (>600) for 4 hours and current insulin gtt rate. No new orders received. Will continue to monitor.

## 2015-02-13 NOTE — Progress Notes (Signed)
Pt admitted from ED. She is A&O X4. VSS. PIV infusing NS. Glucostabilizer and insulin gtt restarted per MD order. Pt oriented to unit and equipment. CCMD and [place] notified.
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
## 2015-02-13 NOTE — Progress Notes (Signed)
Dr. Blaine Hamper at bedside. New orders received to give 10 units of insulin sub-q, 1 L NS bolus, give 1 G of calcium chloride IV, and do 12 L EKG. Will carry out orders and continue to monitor.

## 2015-02-13 NOTE — Progress Notes (Signed)
CRITICAL VALUE ALERT  Critical value received:  Glucose = 890  CO2 = 9  And K+ = 6.6  Date of notification:  02/13/2015  Time of notification:  0530  Critical value read back:Yes.    Nurse who received alert:  K. Royden Purl  MD notified (1st page):  Schorr and Dr. Blaine Hamper  Time of first page:  0535  MD notified (2nd page):  Time of second page:  Responding MD:  Dr. Blaine Hamper  Time MD responded:  0600

## 2015-02-13 NOTE — Progress Notes (Signed)
14 fr foley placed per MD order/protocol d/t unstable critical patient. Pt tolerated procedure well Pecolia Ades, RN present as second person during insertion. Peri care care with soap and water provided prior to insertion and sterile procedure maintained throughout. Will continue to monitor.

## 2015-02-14 DIAGNOSIS — I10 Essential (primary) hypertension: Secondary | ICD-10-CM

## 2015-02-14 LAB — BASIC METABOLIC PANEL
ANION GAP: 4 — AB (ref 5–15)
BUN: 23 mg/dL (ref 6–23)
CALCIUM: 8.4 mg/dL (ref 8.4–10.5)
CO2: 28 mmol/L (ref 19–32)
CREATININE: 2.22 mg/dL — AB (ref 0.50–1.10)
Chloride: 107 mmol/L (ref 96–112)
GFR calc Af Amer: 33 mL/min — ABNORMAL LOW (ref >90)
GFR calc non Af Amer: 29 mL/min — ABNORMAL LOW (ref >90)
Glucose, Bld: 146 mg/dL — ABNORMAL HIGH (ref 70–99)
POTASSIUM: 3.9 mmol/L (ref 3.5–5.1)
Sodium: 139 mmol/L (ref 135–145)

## 2015-02-14 LAB — GLUCOSE, CAPILLARY
GLUCOSE-CAPILLARY: 142 mg/dL — AB (ref 70–99)
Glucose-Capillary: 146 mg/dL — ABNORMAL HIGH (ref 70–99)
Glucose-Capillary: 112 mg/dL — ABNORMAL HIGH (ref 70–99)

## 2015-02-14 LAB — PHOSPHORUS: Phosphorus: 2.2 mg/dL — ABNORMAL LOW (ref 2.3–4.6)

## 2015-02-14 LAB — MAGNESIUM: Magnesium: 1.5 mg/dL (ref 1.5–2.5)

## 2015-02-14 MED ORDER — PANTOPRAZOLE SODIUM 40 MG PO TBEC: 40.0000 mg | DELAYED_RELEASE_TABLET | Freq: Every day | ORAL | Status: DC

## 2015-02-14 MED ORDER — SODIUM PHOSPHATE 3 MMOLE/ML IV SOLN
30.0000 mmol | Freq: Once | INTRAVENOUS | Status: AC
  Administered 2015-02-14: 30 mmol via INTRAVENOUS
  Filled 2015-02-14: qty 10
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
## 2015-02-14 NOTE — Clinical Social Work Note (Signed)
CSW made aware by RN, patient needs letter for work. CSW provided patient with 2 copies of letter for work. No further needs. CSW signing off.  [employer], [place] Week[employer] Clinical Social Worker [phone]

## 2015-02-14 NOTE — Progress Notes (Signed)
[place] [facility] Progress Note Patient Name: Angela [name] DOB: December 19, 1986 MRN: [id number]   Date of Service  02/14/2015  HPI/Events of Note  Hypophosphatemia  eICU Interventions  Phos replaced     Intervention Category Intermediate Interventions: Electrolyte abnormality - evaluation and management  DETERDING,ELIZABETH 02/14/2015, 6:24 AM

## 2015-02-14 NOTE — Progress Notes (Signed)
D/c instructions reviewed with patient. All questions and pt verbalized understanding.

## 2015-02-16 ENCOUNTER — Ambulatory Visit: Payer: Self-pay | Attending: Physician Assistant | Admitting: Family Medicine

## 2015-02-16 VITALS — BP 150/98 | HR 93 | Temp 98.7°F | Resp 16 | Ht 65.0 in | Wt 154.0 lb

## 2015-02-16 DIAGNOSIS — E1059 Type 1 diabetes mellitus with other circulatory complications: Secondary | ICD-10-CM

## 2015-02-16 DIAGNOSIS — N183 Chronic kidney disease, stage 3 (moderate): Secondary | ICD-10-CM | POA: Insufficient documentation

## 2015-02-16 DIAGNOSIS — E1051 Type 1 diabetes mellitus with diabetic peripheral angiopathy without gangrene: Secondary | ICD-10-CM

## 2015-02-16 DIAGNOSIS — E1043 Type 1 diabetes mellitus with diabetic autonomic (poly)neuropathy: Secondary | ICD-10-CM | POA: Insufficient documentation

## 2015-02-16 DIAGNOSIS — K219 Gastro-esophageal reflux disease without esophagitis: Secondary | ICD-10-CM | POA: Insufficient documentation

## 2015-02-16 DIAGNOSIS — K3184 Gastroparesis: Secondary | ICD-10-CM | POA: Insufficient documentation

## 2015-02-16 DIAGNOSIS — N189 Chronic kidney disease, unspecified: Secondary | ICD-10-CM

## 2015-02-16 DIAGNOSIS — E1065 Type 1 diabetes mellitus with hyperglycemia: Secondary | ICD-10-CM | POA: Insufficient documentation

## 2015-02-16 DIAGNOSIS — I129 Hypertensive chronic kidney disease with stage 1 through stage 4 chronic kidney disease, or unspecified chronic kidney disease: Secondary | ICD-10-CM | POA: Insufficient documentation

## 2015-02-16 DIAGNOSIS — E1010 Type 1 diabetes mellitus with ketoacidosis without coma: Secondary | ICD-10-CM | POA: Insufficient documentation

## 2015-02-16 DIAGNOSIS — D631 Anemia in chronic kidney disease: Secondary | ICD-10-CM | POA: Insufficient documentation

## 2015-02-16 DIAGNOSIS — IMO0002 Reserved for concepts with insufficient information to code with codable children: Secondary | ICD-10-CM

## 2015-02-16 DIAGNOSIS — E039 Hypothyroidism, unspecified: Secondary | ICD-10-CM | POA: Insufficient documentation

## 2015-02-16 DIAGNOSIS — E1022 Type 1 diabetes mellitus with diabetic chronic kidney disease: Secondary | ICD-10-CM | POA: Insufficient documentation

## 2015-02-16 DIAGNOSIS — R739 Hyperglycemia, unspecified: Secondary | ICD-10-CM

## 2015-02-16 DIAGNOSIS — E785 Hyperlipidemia, unspecified: Secondary | ICD-10-CM | POA: Insufficient documentation

## 2015-02-16 DIAGNOSIS — Z794 Long term (current) use of insulin: Secondary | ICD-10-CM | POA: Insufficient documentation

## 2015-02-16 LAB — GLUCOSE, POCT (MANUAL RESULT ENTRY)
POC GLUCOSE: 345 mg/dL — AB (ref 70–99)
POC GLUCOSE: 320 mg/dL — AB (ref 70–99)

## 2015-02-16 MED ORDER — INSULIN ASPART 100 UNIT/ML ~~LOC~~ SOLN: 10.0000 [IU] | Freq: Once | SUBCUTANEOUS | Status: DC

## 2015-02-16 NOTE — Patient Instructions (Signed)
Gradually stay out of bed and walk around the house or around the neighborhood the rest of this week in order to return to work on Monday.  See Dr. Adrian Blackwater on Friday and be sure to bring blood sugar readings.

## 2015-02-16 NOTE — Progress Notes (Signed)
Admit Date: 02/12/2015 Discharge date: 02/14/2015  Recommendations for Outpatient Follow-up:  1. Blood cultures pending at time of discharge-please follow 2. Pyruvic acid levels pending-please follow  PRIMARY DISCHARGE DIAGNOSIS: Principal Problem:  DKA (diabetic ketoacidoses) Active Problems:  Hypothyroidism  Type 1 diabetes, uncontrolled, with peripheral circulatory disorder  GERD (gastroesophageal reflux disease)  Hyperlipidemia  HTN (hypertension)  CKD stage G3a/A3, GFR 45 - 59 and albumin creatinine ratio >300 mg/g  Diabetic gastroparesis associated with type 1 diabetes mellitus  Anemia in chronic renal disease  Peripheral neuropathy  Nausea vomiting and diarrhea  Kidney stone  Diabetic ketoacidosis with coma associated with type 1 diabetes mellitus

## 2015-02-16 NOTE — Assessment & Plan Note (Signed)
She is alert, oriented in no acute distress. She appears mildly ill at this time. Neck is supple FROM w/o adenopathy or tenderness. Her lungs are clear, HS are regular. Further exam deferred as she is seeing Dr. Adrian Blackwater on Friday.

## 2015-02-17 LAB — PYRUVIC ACID: Pyruvic Acid, Blood: 1.75 mg/dL — ABNORMAL HIGH (ref 0.30–1.50)

## 2015-02-19 LAB — CULTURE, BLOOD (ROUTINE X 2)
Culture: NO GROWTH
Culture: NO GROWTH

## 2015-02-20 ENCOUNTER — Encounter: Payer: Self-pay | Admitting: Family Medicine

## 2015-02-20 ENCOUNTER — Ambulatory Visit: Payer: Self-pay | Attending: Family Medicine | Admitting: Family Medicine

## 2015-02-20 VITALS — BP 160/100 | HR 103 | Temp 98.3°F | Resp 18 | Ht 65.0 in | Wt 158.0 lb

## 2015-02-20 DIAGNOSIS — E1042 Type 1 diabetes mellitus with diabetic polyneuropathy: Secondary | ICD-10-CM | POA: Insufficient documentation

## 2015-02-20 DIAGNOSIS — E1069 Type 1 diabetes mellitus with other specified complication: Secondary | ICD-10-CM

## 2015-02-20 DIAGNOSIS — G990 Autonomic neuropathy in diseases classified elsewhere: Secondary | ICD-10-CM

## 2015-02-20 DIAGNOSIS — E039 Hypothyroidism, unspecified: Secondary | ICD-10-CM | POA: Insufficient documentation

## 2015-02-20 DIAGNOSIS — E1065 Type 1 diabetes mellitus with hyperglycemia: Secondary | ICD-10-CM

## 2015-02-20 DIAGNOSIS — R358 Other polyuria: Secondary | ICD-10-CM | POA: Insufficient documentation

## 2015-02-20 DIAGNOSIS — I10 Essential (primary) hypertension: Secondary | ICD-10-CM

## 2015-02-20 DIAGNOSIS — Z87891 Personal history of nicotine dependence: Secondary | ICD-10-CM | POA: Insufficient documentation

## 2015-02-20 DIAGNOSIS — B9620 Unspecified Escherichia coli [E. coli] as the cause of diseases classified elsewhere: Secondary | ICD-10-CM | POA: Insufficient documentation

## 2015-02-20 DIAGNOSIS — Z794 Long term (current) use of insulin: Secondary | ICD-10-CM | POA: Insufficient documentation

## 2015-02-20 DIAGNOSIS — B9689 Other specified bacterial agents as the cause of diseases classified elsewhere: Secondary | ICD-10-CM | POA: Insufficient documentation

## 2015-02-20 DIAGNOSIS — N390 Urinary tract infection, site not specified: Secondary | ICD-10-CM | POA: Insufficient documentation

## 2015-02-20 DIAGNOSIS — E1051 Type 1 diabetes mellitus with diabetic peripheral angiopathy without gangrene: Secondary | ICD-10-CM

## 2015-02-20 DIAGNOSIS — E038 Other specified hypothyroidism: Secondary | ICD-10-CM

## 2015-02-20 DIAGNOSIS — I129 Hypertensive chronic kidney disease with stage 1 through stage 4 chronic kidney disease, or unspecified chronic kidney disease: Secondary | ICD-10-CM | POA: Insufficient documentation

## 2015-02-20 DIAGNOSIS — K3184 Gastroparesis: Secondary | ICD-10-CM | POA: Insufficient documentation

## 2015-02-20 DIAGNOSIS — IMO0002 Reserved for concepts with insufficient information to code with codable children: Secondary | ICD-10-CM

## 2015-02-20 DIAGNOSIS — N189 Chronic kidney disease, unspecified: Secondary | ICD-10-CM | POA: Insufficient documentation

## 2015-02-20 DIAGNOSIS — E1059 Type 1 diabetes mellitus with other circulatory complications: Secondary | ICD-10-CM

## 2015-02-20 DIAGNOSIS — E1043 Type 1 diabetes mellitus with diabetic autonomic (poly)neuropathy: Secondary | ICD-10-CM | POA: Insufficient documentation

## 2015-02-20 DIAGNOSIS — Z1619 Resistance to other specified beta lactam antibiotics: Secondary | ICD-10-CM | POA: Insufficient documentation

## 2015-02-20 DIAGNOSIS — N319 Neuromuscular dysfunction of bladder, unspecified: Secondary | ICD-10-CM | POA: Insufficient documentation

## 2015-02-20 LAB — POCT URINALYSIS DIPSTICK
BILIRUBIN UA: NEGATIVE
Glucose, UA: NEGATIVE
Ketones, UA: NEGATIVE
Nitrite, UA: NEGATIVE
PROTEIN UA: 100
Spec Grav, UA: <=1.005
Urobilinogen, UA: 0.2
pH, UA: 6

## 2015-02-20 LAB — POCT CBG (FASTING - GLUCOSE)-MANUAL ENTRY: Glucose Fasting, POC: 56 mg/dL — AB (ref 70–99)

## 2015-02-20 MED ORDER — METOLAZONE 2.5 MG PO TABS: 2.5000 mg | ORAL_TABLET | Freq: Every day | ORAL | Status: DC

## 2015-02-20 MED ORDER — GABAPENTIN 300 MG PO CAPS: 600.0000 mg | ORAL_CAPSULE | Freq: Every day | ORAL | Status: DC

## 2015-02-20 MED ORDER — PRAVASTATIN SODIUM 20 MG PO TABS: 20.0000 mg | ORAL_TABLET | Freq: Every evening | ORAL | Status: DC

## 2015-02-20 MED ORDER — METOCLOPRAMIDE HCL 5 MG PO TABS: 5.0000 mg | ORAL_TABLET | Freq: Three times a day (TID) | ORAL | Status: DC

## 2015-02-20 NOTE — Progress Notes (Signed)
a 29 y.o. female with past medical history of type 1 diabetes, CKD-III, hypertension, hyperlipidemia, asthma, GERD, depression, and hypothyroidism, who presents with nausea, vomiting, diarrhea, bilateral flank pain, chest pressure.   Patient reports that she has been having nausea and vomiting due to gastroparesis, but she started having diarrhea in the past 3 days. She has 5 bowel movement with loose stool today. She denies recent antibiotics use. She does not have significant abdominal pain. Patient also reports having chest pressure feeling. She does not have cough. She has mild shortness of breath. No fever or chills.  Patient reports she had CT-abd/pelvis on 02/05/15 due to left flank pain, which showed 2 mm Nonobstructing left renal calculus. She was given prescription of tramadol by her PCP. She was told that the kidney stone will likely to be passed out. She is taking the pain medications, but she still has severe left flank pain. She does not have dysuria or burning on urination. Patient denies fever, chills, constipation, dysuria, urgency, frequency, skin rashes, joint pain or leg swelling.  Work up in the ED demonstrates elevated anion gap of 19 with bicarbonate 18. Potassium 5.3. Negative urinalysis. Negative pregnancy test. Renal ultrasound has no hydronephrosis.  Pt returns today for t/u Uncontrolled DM Type 1, R flank pain States she is feeling better C/o R flank pain CBG- 56, asymptomatic, drinking ginger-ale and candy, didn't take insulin this am  Urine Dipstick Meter number range 80's-300's< BP elevated- 162/99 103, didn't take medication this am

## 2015-02-20 NOTE — Patient Instructions (Addendum)
Angela [name],  Thank you for coming in today.  1. Diabetic neuropathy: increase gabapentin to 600 mg at night.  2. Urinary retention symptoms with R flank pain: Stop phenergan and zofran as both can potential cause retention Urology referral Urine culture today.  No med at this time as I believe stopping the two meds above will help will help quite a bit.  3. Diabetic gastroparesis: reglan 5 mg with meals and at bedtime. No retention as a side effect Abnormal movement as a a possible side effect at high doses and when taking for > 90 days.   4. Hypothyroidism: TSH check today.   5. Hypertension: BP goal < 140/90 Norvasc 10 mg daily Lasix 40 mg daily  Add metolazone 2.5 mg once daily in the morning this is a diuretic.   F/u in 2 weeks for BP and CBG check  See me in 6 weeks for diabetes  Dr. Adrian Blackwater

## 2015-02-20 NOTE — Progress Notes (Signed)
   Subjective:    Patient ID: Angela [name], female    DOB: Aug 23, 1986, 29 y.o.   MRN: [id number] CC: R flank pain, IDDM type 1 uncontrolled  HPI 29 yo F:  1. IDDM type 1: labile CBGs. No very low sugars lately. Admits to polyuria. Has diabetic neuropathy. Has gastroparesis.  2. Flank pain: persistent flank pain with more urinary frequency in AM. No dysuria or fever. Some anorexia. No weight loss.  3. Hypothyroidism: compliant with synthroid. No weight changes. No tremors or palpitations. Has some leg edema after getting IV fluids in ED last week but this is improving. Taking lasix.   4. CKD: taking lasix. Trying to control BP and sugars. ROS as per above.   Soc Hx: former smoker, quit in 2014.  Review of Systems As per HPI     Objective:   Physical Exam BP 160/100 mmHg  Pulse 103  Temp(Src) 98.3 F (36.8 C) (Oral)  Resp 18  Ht 5\' 5"  (1.651 m)  Wt 158 lb (71.668 kg)  BMI 26.29 kg/m2  SpO2 100%  LMP 02/05/2015  Wt Readings from Last 3 Encounters:  02/20/15 158 lb (71.668 kg)  02/16/15 154 lb (69.854 kg)  02/13/15 153 lb 3.5 oz (69.5 kg)    BP Readings from Last 3 Encounters:  02/20/15 162/99  02/16/15 150/98  02/14/15 127/80  General appearance: alert, cooperative and no distress Back: b/l CVA pain L>R Lungs: clear to auscultation bilaterally Abdomen: soft, non-tender; bowel sounds normal; no masses,  no organomegaly Extremities: extremities normal, atraumatic, no cyanosis or edema  Lab Results  Component Value Date   HGBA1C 11.2 02/05/2015   CBG 56 UA: moderate blood, large LE, neg itrite     Assessment & Plan:

## 2015-02-21 LAB — TSH: TSH: 49.5 u[IU]/mL — ABNORMAL HIGH (ref 0.350–4.500)

## 2015-02-22 DIAGNOSIS — N390 Urinary tract infection, site not specified: Secondary | ICD-10-CM | POA: Insufficient documentation

## 2015-02-22 MED ORDER — CEPHALEXIN 500 MG PO CAPS: 500.0000 mg | ORAL_CAPSULE | Freq: Four times a day (QID) | ORAL | Status: DC

## 2015-02-22 MED ORDER — LEVOTHYROXINE SODIUM 137 MCG PO TABS: 137.0000 ug | ORAL_TABLET | Freq: Every day | ORAL | Status: DC

## 2015-02-22 NOTE — Assessment & Plan Note (Signed)
A:  Lab Results  Component Value Date   TSH 49.500* 02/20/2015   P: Increase synthroid dose from 125 mcg to 137 mcg q AM. Repeat TSH in 12 weeks  Endocrinology referral placed given concomitant uncontrolled IDDM type 1 with multiple complications

## 2015-02-22 NOTE — Assessment & Plan Note (Addendum)
A: E coli positive urine culture with intermediate sensitivity 4th gen cephalosporin and resistant to 1st gen  with b/l flank pain, frequency. No fever, dysuria. Patient with symptoms of neurogenic bladder so colonization most likely. P: Treat with macrobid x 7 days  Patient has been started on flomax

## 2015-02-23 LAB — URINE CULTURE: Colony Count: >=100000

## 2015-02-23 MED ORDER — NITROFURANTOIN MONOHYD MACRO 100 MG PO CAPS: 100.0000 mg | ORAL_CAPSULE | Freq: Two times a day (BID) | ORAL | Status: DC

## 2015-02-23 NOTE — Assessment & Plan Note (Signed)
3. Diabetic gastroparesis: reglan 5 mg with meals and at bedtime. No retention as a side effect Abnormal movement as a a possible side effect at high doses and when taking for > 90 days.

## 2015-02-23 NOTE — Assessment & Plan Note (Signed)
1. Diabetic neuropathy: increase gabapentin to 600 mg at night.

## 2015-02-23 NOTE — Assessment & Plan Note (Signed)
Urinary retention symptoms with R flank pain: Stop phenergan and zofran as both can potential cause retention Urology referral Urine culture today.  No med at this time as I believe stopping the two meds above will help will help quite a bit.

## 2015-02-23 NOTE — Assessment & Plan Note (Signed)
Hypertension: BP goal < 140/90 Norvasc 10 mg daily Lasix 40 mg daily  Add metolazone 2.5 mg once daily in the morning this is a diuretic.

## 2015-03-11 ENCOUNTER — Telehealth: Payer: Self-pay | Admitting: *Deleted

## 2015-03-11 NOTE — Telephone Encounter (Signed)
-----   Message from Boykin Nearing, MD sent at 02/23/2015  1:20 PM EST ----- Please call patient. E coli with only intermediate sensitivity to keflex. Plan to treat with macrobid instead

## 2015-03-11 NOTE — Telephone Encounter (Signed)
Left voice message to return call 

## 2015-03-11 NOTE — Telephone Encounter (Signed)
-----   Message from Minerva Ends, MD sent at 02/22/2015  7:48 PM EST ----- Urine culture with 100 K E. Coli will send in keflex 500 mg QID x 7 days  TSH elevated increase synthroid dose

## 2015-03-12 ENCOUNTER — Ambulatory Visit: Payer: Self-pay | Attending: Family Medicine | Admitting: *Deleted

## 2015-03-12 VITALS — BP 145/92 | HR 98 | Temp 98.9°F | Resp 18

## 2015-03-12 DIAGNOSIS — Z794 Long term (current) use of insulin: Secondary | ICD-10-CM | POA: Insufficient documentation

## 2015-03-12 DIAGNOSIS — IMO0002 Reserved for concepts with insufficient information to code with codable children: Secondary | ICD-10-CM

## 2015-03-12 DIAGNOSIS — E1065 Type 1 diabetes mellitus with hyperglycemia: Secondary | ICD-10-CM

## 2015-03-12 DIAGNOSIS — Z87891 Personal history of nicotine dependence: Secondary | ICD-10-CM | POA: Insufficient documentation

## 2015-03-12 DIAGNOSIS — E1051 Type 1 diabetes mellitus with diabetic peripheral angiopathy without gangrene: Secondary | ICD-10-CM

## 2015-03-12 DIAGNOSIS — I10 Essential (primary) hypertension: Secondary | ICD-10-CM | POA: Insufficient documentation

## 2015-03-12 LAB — GLUCOSE, POCT (MANUAL RESULT ENTRY): POC Glucose: 170 mg/dl — AB (ref 70–99)

## 2015-03-12 MED ORDER — INSULIN GLARGINE 300 UNIT/ML ~~LOC~~ SOPN: 20.0000 [IU] | PEN_INJECTOR | Freq: Every day | SUBCUTANEOUS | Status: DC

## 2015-03-12 NOTE — Progress Notes (Signed)
Patient presents for BP check, CBG and record review Med list reviewed; patient states she was unaware of rx for macrobid, increase in levothyroxine and reorder of reglan Patient has continued with b/l flank pain; L > R at present Patient lost glucometer for 7 days and was continuing to inject 12 units novolog insulin tid. Discussed importance of knowing blood sugar prior to injecting fast acting insulin as well as eating immediately after injecting fast acting insulin. Patient states understanding. Only 3 days data recorded in BS log Patient's AM fasting blood sugars ranging 54-57 (2 readings) Patient's before lunch blood sugars ranging 79-323 (4 readings) Patient's before dinner blood sugars ranging 148-309 (2 readings) Patient's before bed blood sugar 304 (1 reading)  CBG 170  BP 145/92 P 98 R 18 T  98.9oral SpO2 98%  Per PCP: Decrease toujeo to 20 units qHS  Patient will pick up rx for macrobid, levothyroxine 137 mcg and reglan and begin each today  Patient will return in 2 weeks for nurse visit for BP check and CBG and record review  Patient advised to call for med refills at least 7 days before running out so as not to go without.  Patient given literature on hypoglycemia. S/sx of hypoglycemia and immediate actions to take discussed in detail.

## 2015-03-20 ENCOUNTER — Telehealth: Payer: Self-pay | Admitting: Emergency Medicine

## 2015-03-20 NOTE — Telephone Encounter (Signed)
Patient came in with a LOA request that needed to be filled out. Dr Adrian Blackwater completed and signed same. It was given directly to the patient to return to her employer.

## 2015-03-31 ENCOUNTER — Ambulatory Visit: Payer: Self-pay

## 2015-04-10 ENCOUNTER — Ambulatory Visit: Payer: Self-pay | Admitting: Internal Medicine

## 2015-05-01 ENCOUNTER — Other Ambulatory Visit: Payer: Self-pay | Admitting: Family Medicine

## 2015-05-07 ENCOUNTER — Ambulatory Visit: Payer: Self-pay | Attending: Family Medicine | Admitting: Family Medicine

## 2015-05-07 ENCOUNTER — Encounter: Payer: Self-pay | Admitting: Family Medicine

## 2015-05-07 VITALS — BP 155/97 | HR 96 | Temp 98.4°F | Resp 16 | Ht 65.0 in | Wt 145.0 lb

## 2015-05-07 DIAGNOSIS — R109 Unspecified abdominal pain: Secondary | ICD-10-CM

## 2015-05-07 DIAGNOSIS — E039 Hypothyroidism, unspecified: Secondary | ICD-10-CM

## 2015-05-07 DIAGNOSIS — E1065 Type 1 diabetes mellitus with hyperglycemia: Principal | ICD-10-CM

## 2015-05-07 DIAGNOSIS — E1059 Type 1 diabetes mellitus with other circulatory complications: Secondary | ICD-10-CM

## 2015-05-07 DIAGNOSIS — I10 Essential (primary) hypertension: Secondary | ICD-10-CM

## 2015-05-07 DIAGNOSIS — E1051 Type 1 diabetes mellitus with diabetic peripheral angiopathy without gangrene: Secondary | ICD-10-CM

## 2015-05-07 DIAGNOSIS — IMO0002 Reserved for concepts with insufficient information to code with codable children: Secondary | ICD-10-CM

## 2015-05-07 LAB — COMPLETE METABOLIC PANEL WITH GFR
ALK PHOS: 108 U/L (ref 39–117)
ALT: 8 U/L (ref 0–35)
AST: 14 U/L (ref 0–37)
Albumin: 3.7 g/dL (ref 3.5–5.2)
BILIRUBIN TOTAL: 0.3 mg/dL (ref 0.2–1.2)
BUN: 30 mg/dL — AB (ref 6–23)
CO2: 25 mEq/L (ref 19–32)
CREATININE: 2.17 mg/dL — AB (ref 0.50–1.10)
Calcium: 9.3 mg/dL (ref 8.4–10.5)
Chloride: 96 mEq/L (ref 96–112)
GFR, Est African American: 34 mL/min — ABNORMAL LOW
GFR, Est Non African American: 30 mL/min — ABNORMAL LOW
Glucose, Bld: 229 mg/dL — ABNORMAL HIGH (ref 70–99)
Potassium: 5.7 mEq/L — ABNORMAL HIGH (ref 3.5–5.3)
Sodium: 132 mEq/L — ABNORMAL LOW (ref 135–145)
Total Protein: 7 g/dL (ref 6.0–8.3)

## 2015-05-07 LAB — GLUCOSE, POCT (MANUAL RESULT ENTRY): POC Glucose: 237 mg/dl — AB (ref 70–99)

## 2015-05-07 LAB — CBC
HCT: 25.1 % — ABNORMAL LOW (ref 36.0–46.0)
HEMOGLOBIN: 7.8 g/dL — AB (ref 12.0–15.0)
MCH: 26.2 pg (ref 26.0–34.0)
MCHC: 31.1 g/dL (ref 30.0–36.0)
MCV: 84.2 fL (ref 78.0–100.0)
MPV: 12.4 fL (ref 8.6–12.4)
PLATELETS: 246 10*3/uL (ref 150–400)
RBC: 2.98 MIL/uL — AB (ref 3.87–5.11)
RDW: 15 % (ref 11.5–15.5)
WBC: 8.2 10*3/uL (ref 4.0–10.5)

## 2015-05-07 LAB — POCT URINALYSIS DIPSTICK
Bilirubin, UA: NEGATIVE
Glucose, UA: 100
Ketones, UA: NEGATIVE
Nitrite, UA: NEGATIVE
Protein, UA: 100
Spec Grav, UA: 1.01
UROBILINOGEN UA: 0.2
pH, UA: 6.5

## 2015-05-07 LAB — POCT URINE PREGNANCY: Preg Test, Ur: NEGATIVE

## 2015-05-07 LAB — POCT GLYCOSYLATED HEMOGLOBIN (HGB A1C): HEMOGLOBIN A1C: 9.9

## 2015-05-07 MED ORDER — CEFTRIAXONE SODIUM 1 G IJ SOLR
1.0000 g | Freq: Once | INTRAMUSCULAR | Status: AC
  Administered 2015-05-07: 1 g via INTRAMUSCULAR

## 2015-05-07 MED ORDER — TRAMADOL HCL 50 MG PO TABS: 50.0000 mg | ORAL_TABLET | Freq: Three times a day (TID) | ORAL | Status: DC | PRN

## 2015-05-07 MED ORDER — ONDANSETRON HCL 8 MG PO TABS: 8.0000 mg | ORAL_TABLET | Freq: Three times a day (TID) | ORAL | Status: DC | PRN

## 2015-05-07 MED ORDER — GI COCKTAIL ~~LOC~~
30.0000 mL | Freq: Once | ORAL | Status: AC
  Administered 2015-05-07: 30 mL via ORAL

## 2015-05-07 MED ORDER — INSULIN ASPART 100 UNIT/ML ~~LOC~~ SOLN: SUBCUTANEOUS | Status: DC

## 2015-05-07 MED ORDER — PANTOPRAZOLE SODIUM 40 MG PO TBEC: 40.0000 mg | DELAYED_RELEASE_TABLET | Freq: Every day | ORAL | Status: DC

## 2015-05-07 NOTE — Assessment & Plan Note (Signed)
L flank pain: Kidney stone vs gastritis vs kindey infection (less likely)  Add protonix 40 mg 30 minutes before supper Refilled tramadol You will be called if urine culture is positive

## 2015-05-07 NOTE — Assessment & Plan Note (Signed)
Diabetes: Improving A1c, well done Continue to take insulin Increase novolog to 15 U after breakfast and lunch, 12 U after dinner Keep toujeo at 20 U  Check and write down sugars and bring log to f/u apt with me and your new endocrinologist

## 2015-05-07 NOTE — Assessment & Plan Note (Signed)
HTN: BP elevated Increase to metolazone to 5 mg

## 2015-05-07 NOTE — Progress Notes (Signed)
   Subjective:    Patient ID: Angela [name], female    DOB: Sep 13, 1986, 29 y.o.   MRN: [id number] CC;  HPI  1. CHRONIC DIABETES  Disease Monitoring  Blood Sugar Ranges: 29 (Sunday took insulin fell asleep). Lowest outside 97. In afternoons getting into 250-270.   Polyuria: no   Visual problems: no   Medication Compliance: yes  Medication Side Effects  Hypoglycemia: yes once this past Sunday    2. L flank pain: started one week ago. Constant. Achy and sharp pains that radiate to R groin. Also has epigastric pain. Has nausea and emesis. Emesis was bilious this AM.  Has chills. No fever.   3. HTN: taking lasix, metolazone, norvasc. Not feeling well today. Eating a regular diet. No smoking.  4. Hypothyroidism: taking synthroid on empty stomach. Weight stable. Energy poor for last week but usually normal. Chills x 1 week.   Soc Hx: non smoker  Review of Systems  Constitutional: Negative for fever and chills.  Gastrointestinal: Positive for nausea, vomiting and abdominal pain.  Genitourinary: Positive for flank pain. Negative for dysuria, vaginal discharge and pelvic pain.       Objective:   Physical Exam BP 155/97 mmHg  Pulse 96  Temp(Src) 98.4 F (36.9 C) (Oral)  Resp 16  Ht 5\' 5"  (1.651 m)  Wt 145 lb (65.772 kg)  BMI 24.13 kg/m2  SpO2 98%  LMP 05/04/2015  BP Readings from Last 3 Encounters:  05/07/15 155/97  03/12/15 145/92  02/20/15 160/100  General appearance: alert, cooperative and mild distress Throat: lips, mucosa, and tongue normal; teeth and gums normal Back: L CVA tenderness  Lungs: clear to auscultation bilaterally Heart: regular rate and rhythm, S1, S2 normal, no murmur, click, rub or gallop Abdomen: NABS, flat, moderate TTP to L mid and epigastric area   Lab Results  Component Value Date   HGBA1C 9.90 05/07/2015   U preg: negative  UA: moderate LE, moderate RBC CBG  237   Tx with 1000 mg rocephin IM Tx with GI cocktail   F/u to GI cocktail:  patient endorsed numbness in epigastric area and resolution of epigastric pain. Persistent L flank pain.     Assessment & Plan:

## 2015-05-07 NOTE — Patient Instructions (Addendum)
Ms. Roses,  Thank you for coming in today  1. Diabetes: Improving A1c, well done Continue to take insulin Increase novolog to 15 U after breakfast and lunch, 12 U after dinner Keep toujeo at 20 U  Check and write down sugars and bring log to f/u apt with me and your new endocrinologist   2. HTN: BP elevated Increase to metolazone to 5 mg   3. L flank pain: Kidney stone vs gastritis vs kindey infection (less likely)  Add protonix 40 mg 30 minutes before supper Refilled tramadol You will be called if urine culture is positive   F/u in 3 weeks with RN for BP check F/u with me in 6 weeks  Dr. Adrian Blackwater

## 2015-05-07 NOTE — Progress Notes (Signed)
F/U DM  Complaining of LUQ pain x3 month Worsen in the past week  No Hx tobacco

## 2015-05-08 ENCOUNTER — Ambulatory Visit: Payer: Self-pay | Admitting: Internal Medicine

## 2015-05-08 DIAGNOSIS — Z0289 Encounter for other administrative examinations: Secondary | ICD-10-CM

## 2015-05-08 LAB — TSH: TSH: 18.692 u[IU]/mL — AB (ref 0.350–4.500)

## 2015-05-10 ENCOUNTER — Telehealth: Payer: Self-pay | Admitting: Family Medicine

## 2015-05-10 DIAGNOSIS — N390 Urinary tract infection, site not specified: Secondary | ICD-10-CM

## 2015-05-10 DIAGNOSIS — E038 Other specified hypothyroidism: Secondary | ICD-10-CM

## 2015-05-10 DIAGNOSIS — B9620 Unspecified Escherichia coli [E. coli] as the cause of diseases classified elsewhere: Secondary | ICD-10-CM

## 2015-05-10 LAB — URINE CULTURE

## 2015-05-10 MED ORDER — NITROFURANTOIN MONOHYD MACRO 100 MG PO CAPS: 100.0000 mg | ORAL_CAPSULE | Freq: Two times a day (BID) | ORAL | Status: DC

## 2015-05-10 MED ORDER — LEVOTHYROXINE SODIUM 150 MCG PO TABS: 150.0000 ug | ORAL_TABLET | Freq: Every day | ORAL | Status: DC

## 2015-05-10 NOTE — Assessment & Plan Note (Addendum)
Urine culture with E coli sensitive to Macrobid, bactrim, fluoroquinolones. Patient allergic to bactrim and flouroquinolones and cannot take macrobid due to CKD.  Plan to treat with IM Rocephin x 1 gm for 2 more doses. Urology referral to evaluate suspected neurogenic bladder with E coli colonization

## 2015-05-10 NOTE — Telephone Encounter (Signed)
Called patient  Spoke to her about her labs and treatment Elevated TSH increased synthroid to 150 mcg q AM E coli, planned to treat with macrobid  Called patient back left VM updating plan for E coli UTI due to renal impairment, patient cannot take macrobid, instead treating with CTX 1 gm IM q 24 hrs x 2 doses.   Asked patient to come in for CTX dose tomorrow and again on Tuesday.

## 2015-05-11 ENCOUNTER — Ambulatory Visit: Payer: Self-pay | Attending: Family Medicine | Admitting: *Deleted

## 2015-05-11 DIAGNOSIS — N3000 Acute cystitis without hematuria: Secondary | ICD-10-CM

## 2015-05-11 MED ORDER — CEFTRIAXONE SODIUM 1 G IJ SOLR
1.0000 g | INTRAMUSCULAR | Status: DC
  Administered 2015-05-11 – 2015-05-12 (×2): 1 g via INTRAMUSCULAR

## 2015-05-11 NOTE — Progress Notes (Signed)
Patient ID: Angela [name], female   DOB: 1986/07/29, 29 y.o.   MRN: [id number]  patient here for second dose of Ceftriaxone for her ecoli UTI.  Injection given in left ventrogluteal and patient tolerated well.  She is to return tomorrow for her next injection.  Patient aware

## 2015-05-12 ENCOUNTER — Ambulatory Visit: Payer: Self-pay | Attending: Family Medicine | Admitting: *Deleted

## 2015-05-12 DIAGNOSIS — N3000 Acute cystitis without hematuria: Secondary | ICD-10-CM

## 2015-05-12 NOTE — Progress Notes (Unsigned)
Patient ID: Angela [name], female   DOB: [birth year], 29 y.o.   MRN: [id number]   Patient received injection of Ceftriaxone per Dr. Adrian Blackwater' instruction in her left deltoid.  Injection was well tolerated.

## 2015-05-20 ENCOUNTER — Other Ambulatory Visit: Payer: Self-pay | Admitting: Family Medicine

## 2015-05-20 ENCOUNTER — Other Ambulatory Visit: Payer: Self-pay | Admitting: *Deleted

## 2015-05-20 DIAGNOSIS — R109 Unspecified abdominal pain: Secondary | ICD-10-CM

## 2015-05-20 MED ORDER — PANTOPRAZOLE SODIUM 40 MG PO TBEC: 40.0000 mg | DELAYED_RELEASE_TABLET | Freq: Every day | ORAL | Status: DC

## 2015-05-20 MED ORDER — AMLODIPINE BESYLATE 10 MG PO TABS: 5.0000 mg | ORAL_TABLET | Freq: Every day | ORAL | Status: DC

## 2015-05-21 ENCOUNTER — Other Ambulatory Visit: Payer: Self-pay | Admitting: Family Medicine

## 2015-05-28 ENCOUNTER — Other Ambulatory Visit: Payer: Self-pay | Admitting: Family Medicine

## 2015-06-03 ENCOUNTER — Ambulatory Visit: Payer: Self-pay | Attending: Family Medicine | Admitting: Physician Assistant

## 2015-06-03 ENCOUNTER — Other Ambulatory Visit: Payer: Self-pay

## 2015-06-03 VITALS — BP 133/96 | HR 107 | Temp 98.2°F | Resp 18 | Ht 65.0 in | Wt 138.6 lb

## 2015-06-03 DIAGNOSIS — N3000 Acute cystitis without hematuria: Secondary | ICD-10-CM | POA: Insufficient documentation

## 2015-06-03 DIAGNOSIS — N390 Urinary tract infection, site not specified: Secondary | ICD-10-CM

## 2015-06-03 DIAGNOSIS — R112 Nausea with vomiting, unspecified: Secondary | ICD-10-CM | POA: Insufficient documentation

## 2015-06-03 DIAGNOSIS — R0789 Other chest pain: Secondary | ICD-10-CM | POA: Insufficient documentation

## 2015-06-03 DIAGNOSIS — R071 Chest pain on breathing: Secondary | ICD-10-CM

## 2015-06-03 DIAGNOSIS — E1065 Type 1 diabetes mellitus with hyperglycemia: Secondary | ICD-10-CM

## 2015-06-03 LAB — GLUCOSE, POCT (MANUAL RESULT ENTRY): POC Glucose: 87 mg/dl (ref 70–99)

## 2015-06-03 LAB — POCT URINALYSIS DIPSTICK
Bilirubin, UA: NEGATIVE
Glucose, UA: 100
KETONES UA: NEGATIVE
Nitrite, UA: POSITIVE
PH UA: 6
Protein, UA: >=300
SPEC GRAV UA: 1.015
Urobilinogen, UA: 0.2

## 2015-06-03 LAB — POCT URINE PREGNANCY: PREG TEST UR: NEGATIVE

## 2015-06-03 MED ORDER — FUROSEMIDE 40 MG PO TABS: 40.0000 mg | ORAL_TABLET | Freq: Every day | ORAL | Status: DC

## 2015-06-03 NOTE — Progress Notes (Signed)
Patient here for blood pressure check and complaints of chest/flank pain and nausea/vomiting.  Patient reports episodes of nausea/vomiting for the past week and last time vomiting was yesterday.  Patient's main complaint is chest pain causing her SOB for the past 4 days.  She describes this pain as 4/10, sharp/pressure/tightness.  Patient also complains of right-sided flank pain for the past 2-3 weeks, described as 9/10 and is constant/throbbing.  Patient states pain is relieved by "lying on left side or stomach and using a heating pad."  Patient reports noticing a painful "lump" in that right-side flank area a few nights ago but is no longer present.  Patient BP 133/96, HR 107, CBG 87.

## 2015-06-03 NOTE — Patient Instructions (Signed)
GO TO THE ED this afternoon!!

## 2015-06-03 NOTE — Progress Notes (Signed)
Chief Complaint: Nausea, vomiting Right flank pain  Subjective: This is an unfortunate 29 year old female with type 1 diabetes. She is fairly compliant with her insulin regimen get still experiences hyperglycemia at times. She is presenting today with increasing nausea and vomiting for several weeks. She's not eating or drinking currently. Her blood sugar last night was 550. She also complains of right flank pain and difficulty urinating. No blood in her urine. No foul odor to her urine. No fevers. She was recently diagnosed with an Escherichia coli urinary tract infection and has multi drug allergies. She also has chronic kidney disease. She was treated with Rocephin IM 3 doses. She sore in her right flank. Occasionally the right flank pain will radiate to the center of her chest and she feels have urinary times. She hurts in her chest to take a deep breath. Sometimes she hurts in her chest to urinate as well.   ROS:  GEN: denies fever or chills, denies change in weight Skin: denies lesions or rashes HEENT: denies headache, earache, epistaxis, sore throat, or neck pain LUNGS: denies SHOB, dyspnea, PND, orthopnea CV: denies CP or palpitations ABD: + abd pain, N or V EXT: denies muscle spasms or swelling; no pain in lower ext, no weakness NEURO: denies numbness or tingling, denies sz, stroke or TIA   Objective:  Filed Vitals:   06/03/15 1124  BP: 133/96  Pulse: 107  Temp: 98.2 F (36.8 C)  TempSrc: Oral  Resp: 18  Height: 5\' 5"  (1.651 m)  Weight: 138 lb 9.6 oz (62.869 kg)  SpO2: 100%    Physical Exam: General: in no acute distress. Heart: Normal  s1 &s2  Regular rate and rhythm, without murmurs, rubs, gallops. Lungs: Clear to auscultation bilaterally. Abdomen: Soft, tender, nondistended, positive bowel sounds. Extremities: No clubbing cyanosis or edema with positive pedal pulses. Neuro: Alert, awake, oriented x3, nonfocal.  Pertinent Lab Results: Pregnancy test  negative Urine dipstick positive nitrites Blood glucose equals 87 (she admits to taking a heavy dose of insulin just prior to arrival to"trick" [place]) EKG-normal sinus rhythm, no ST-T wave abnormalities  Medications: Prior to Admission medications   Medication Sig Start Date End Date Taking? Authorizing Provider  albuterol (PROVENTIL HFA;VENTOLIN HFA) 108 (90 BASE) MCG/ACT inhaler Inhale 1-2 puffs into the lungs every 6 (six) hours as needed for wheezing or shortness of breath. 08/06/14  Yes Jessee Avers, MD  amLODipine (NORVASC) 10 MG tablet Take 0.5 tablets (5 mg total) by mouth daily. 05/20/15  Yes Josalyn Funches, MD  ferrous sulfate 325 (65 FE) MG tablet Take 1 tablet (325 mg total) by mouth 3 (three) times daily with meals. 02/06/15  Yes Josalyn Funches, MD  gabapentin (NEURONTIN) 300 MG capsule Take 2 capsules (600 mg total) by mouth at bedtime. 02/20/15  Yes Josalyn Funches, MD  insulin aspart (NOVOLOG) 100 UNIT/ML injection 15 U  Subcutaneous with breakfast, 15 U with lunch, 12 U with dinner 05/07/15  Yes Josalyn Funches, MD  Insulin Glargine (TOUJEO SOLOSTAR) 300 UNIT/ML SOPN Inject 20 Units into the skin at bedtime. 03/12/15  Yes Boykin Nearing, MD  levothyroxine (SYNTHROID, LEVOTHROID) 150 MCG tablet Take 1 tablet (150 mcg total) by mouth daily before breakfast. 05/10/15  Yes Josalyn Funches, MD  metoCLOPramide (REGLAN) 5 MG tablet Take 1 tablet (5 mg total) by mouth 4 (four) times daily -  before meals and at bedtime. 02/20/15  Yes Josalyn Funches, MD  ondansetron (ZOFRAN) 8 MG tablet Take 1 tablet (8 mg total) by mouth  every 8 (eight) hours as needed for nausea or vomiting. 05/07/15  Yes Josalyn Funches, MD  pantoprazole (PROTONIX) 40 MG tablet Take 1 tablet (40 mg total) by mouth daily before supper. 05/20/15  Yes Josalyn Funches, MD  pravastatin (PRAVACHOL) 20 MG tablet Take 1 tablet (20 mg total) by mouth every evening. 02/20/15  Yes Josalyn Funches, MD  traMADol (ULTRAM) 50 MG tablet Take 1  tablet (50 mg total) by mouth every 8 (eight) hours as needed. 05/07/15  Yes Josalyn Funches, MD  vitamin B-12 (CYANOCOBALAMIN) 250 MCG tablet Take 1 tablet (250 mcg total) by mouth daily. 08/06/14  Yes Jessee Avers, MD  furosemide (LASIX) 40 MG tablet Take 1 tablet (40 mg total) by mouth daily. 06/03/15   Brayton Caves, PA-C    Assessment: 1. Nausea, vomiting, likely secondary to hyperglycemia in a type I diabetic 2. Acute cystitis with possible early pyelonephritis 3. Atypical chest pain-likely noncardiac, likely referred pain  Plan: Go straight to the emergency department.  The patient was given clear instructions to go to ER today for management of her type 1 diabetes including IV fluid resuscitation. Also she needs management of her urinary tract infection/possible acute pyelonephritis. She has multiple drug allergies and multi drug resistance with her recent culture. The patient verbalized understanding.   Follow up: 1 week posthospitalization with Dr. Adrian Blackwater  Total length of time with patient was 28 minutes in regards to the above.  This note has been created with Surveyor, quantity. Any transcriptional errors are unintentional.   Zettie Pho, PA-C 06/03/2015, 12:16 PM

## 2015-06-15 ENCOUNTER — Other Ambulatory Visit: Payer: Self-pay

## 2015-06-17 ENCOUNTER — Other Ambulatory Visit: Payer: Self-pay | Admitting: Pharmacist

## 2015-06-17 ENCOUNTER — Encounter: Payer: Self-pay | Admitting: Family Medicine

## 2015-06-17 ENCOUNTER — Ambulatory Visit: Payer: MEDICAID | Attending: Family Medicine | Admitting: Family Medicine

## 2015-06-17 VITALS — BP 131/86 | HR 95 | Temp 98.1°F | Resp 16 | Ht 65.0 in | Wt 149.0 lb

## 2015-06-17 DIAGNOSIS — IMO0002 Reserved for concepts with insufficient information to code with codable children: Secondary | ICD-10-CM

## 2015-06-17 DIAGNOSIS — E1043 Type 1 diabetes mellitus with diabetic autonomic (poly)neuropathy: Secondary | ICD-10-CM

## 2015-06-17 DIAGNOSIS — R109 Unspecified abdominal pain: Secondary | ICD-10-CM | POA: Insufficient documentation

## 2015-06-17 DIAGNOSIS — Z87891 Personal history of nicotine dependence: Secondary | ICD-10-CM | POA: Insufficient documentation

## 2015-06-17 DIAGNOSIS — N12 Tubulo-interstitial nephritis, not specified as acute or chronic: Secondary | ICD-10-CM | POA: Insufficient documentation

## 2015-06-17 DIAGNOSIS — R195 Other fecal abnormalities: Secondary | ICD-10-CM | POA: Insufficient documentation

## 2015-06-17 DIAGNOSIS — Z794 Long term (current) use of insulin: Secondary | ICD-10-CM | POA: Insufficient documentation

## 2015-06-17 DIAGNOSIS — E1051 Type 1 diabetes mellitus with diabetic peripheral angiopathy without gangrene: Secondary | ICD-10-CM

## 2015-06-17 DIAGNOSIS — E1059 Type 1 diabetes mellitus with other circulatory complications: Secondary | ICD-10-CM | POA: Insufficient documentation

## 2015-06-17 DIAGNOSIS — E1065 Type 1 diabetes mellitus with hyperglycemia: Secondary | ICD-10-CM

## 2015-06-17 DIAGNOSIS — K3184 Gastroparesis: Secondary | ICD-10-CM | POA: Insufficient documentation

## 2015-06-17 LAB — POCT URINALYSIS DIPSTICK
Bilirubin, UA: NEGATIVE
Glucose, UA: NEGATIVE
Ketones, UA: NEGATIVE
NITRITE UA: NEGATIVE
Protein, UA: 30
SPEC GRAV UA: 1.01
UROBILINOGEN UA: 0.2
pH, UA: 7

## 2015-06-17 LAB — GLUCOSE, POCT (MANUAL RESULT ENTRY): POC Glucose: 82 mg/dl (ref 70–99)

## 2015-06-17 MED ORDER — INSULIN GLARGINE 100 UNIT/ML SOLOSTAR PEN: PEN_INJECTOR | SUBCUTANEOUS | Status: DC

## 2015-06-17 MED ORDER — INSULIN ASPART 100 UNIT/ML ~~LOC~~ SOLN: 15.0000 [IU] | Freq: Three times a day (TID) | SUBCUTANEOUS | Status: DC

## 2015-06-17 MED ORDER — INSULIN GLARGINE 300 UNIT/ML ~~LOC~~ SOPN: 10.0000 [IU] | PEN_INJECTOR | Freq: Every day | SUBCUTANEOUS | Status: DC

## 2015-06-17 NOTE — Assessment & Plan Note (Signed)
  3. Hard stools: Take iron with vit C to help with absorption

## 2015-06-17 NOTE — Patient Instructions (Addendum)
Ms. Hockert,  Thank you for coming in today  1. IDDM type 1 Decrease toujeo to 10 U in the morning instead of at night Take novolog 15 U three times daily with food Check sugars before novolog doses and fasting first thing in AM endocrinology referral has been placed   Diabetes blood sugar goals  Fasting (in AM before breakfast, 8 hrs of no eating or drinking (except water or unsweetened coffee or tea): 90-110 2 hrs after meals: < 160,   No low sugars: nothing < 70   2. Recent UTI: Normal UA today  Urology referral has been placed  3. Hard stools: Take iron with vit C to help with absorption  F/u in 4 weeks for diabetes and TSH check with blood work   Dr. Adrian Blackwater

## 2015-06-17 NOTE — Progress Notes (Signed)
   Subjective:    Patient ID: Angela [name], female    DOB: 10-22-1986, 29 y.o.   MRN: [id number] CC: HFU sepsis 2/2 pyelonephritis, IDDM type 1  HPI  1. Sepsis 2/2 acute pyelonephriits: hospitalized from 6/15 to 06/07/2015 at  took last dose of antibiotic last night, was treated with cipro and tolerated this well. Discharge summary reviewed. patient feels much better. She did not develop hives. No flank pain, fever, chills, dysuria. Has not yet established with urology.   2. IDDM type 1: gets low sugars < 70 when she takes toujeo 20 U at night. Gets hyperglycemia when she does not take toujeo. No GI upset. Keeping sugar log. Has not yet established with endocrinology.   Soc Hx: former smoker, quit  Review of Systems  Constitutional: Negative for fever and chills.  Gastrointestinal: Positive for constipation. Negative for nausea, vomiting and abdominal pain.  Genitourinary: Negative for dysuria and flank pain.       Objective:   Physical Exam BP 131/86 mmHg  Pulse 95  Temp(Src) 98.1 F (36.7 C) (Oral)  Resp 16  Ht 5\' 5"  (1.651 m)  Wt 149 lb (67.586 kg)  BMI 24.79 kg/m2  SpO2 99%  LMP 06/04/2015 General appearance: alert, cooperative and no distress Back: symmetric, no curvature. ROM normal. No CVA tenderness. Abdomen: soft, non-tender; bowel sounds normal; no masses,  no organomegaly Extremities: extremities normal, atraumatic, no cyanosis or edema  Lab Results  Component Value Date   HGBA1C 9.90 05/07/2015   CBG 82     Assessment & Plan:

## 2015-06-17 NOTE — Assessment & Plan Note (Signed)
A:  Recent UTI progressed to pyelonephritis and sepsis, now resolved. Flank pain resolved P: Normal UA today  Urology referral has been placed

## 2015-06-17 NOTE — Assessment & Plan Note (Signed)
IDDM type 1 Decrease toujeo to 10 U in the morning instead of at night Take novolog 15 U three times daily with food Check sugars before novolog doses and fasting first thing in AM endocrinology referral has been placed   Diabetes blood sugar goals  Fasting (in AM before breakfast, 8 hrs of no eating or drinking (except water or unsweetened coffee or tea): 90-110 2 hrs after meals: < 160,   No low sugars: nothing < 70

## 2015-07-20 ENCOUNTER — Encounter (HOSPITAL_COMMUNITY): Payer: Self-pay | Admitting: *Deleted

## 2015-07-20 ENCOUNTER — Emergency Department (HOSPITAL_COMMUNITY): Payer: Self-pay

## 2015-07-20 ENCOUNTER — Inpatient Hospital Stay (HOSPITAL_COMMUNITY)
Admission: EM | Admit: 2015-07-20 | Discharge: 2015-07-24 | DRG: 638 | Disposition: A | Discharge status: Home / Self Care | Payer: Self-pay | Attending: Internal Medicine | Admitting: Internal Medicine

## 2015-07-20 DIAGNOSIS — N189 Chronic kidney disease, unspecified: Secondary | ICD-10-CM

## 2015-07-20 DIAGNOSIS — Z794 Long term (current) use of insulin: Secondary | ICD-10-CM

## 2015-07-20 DIAGNOSIS — E860 Dehydration: Secondary | ICD-10-CM | POA: Diagnosis present

## 2015-07-20 DIAGNOSIS — E1043 Type 1 diabetes mellitus with diabetic autonomic (poly)neuropathy: Secondary | ICD-10-CM | POA: Diagnosis present

## 2015-07-20 DIAGNOSIS — IMO0002 Reserved for concepts with insufficient information to code with codable children: Secondary | ICD-10-CM

## 2015-07-20 DIAGNOSIS — E1110 Type 2 diabetes mellitus with ketoacidosis without coma: Secondary | ICD-10-CM

## 2015-07-20 DIAGNOSIS — Z9114 Patient's other noncompliance with medication regimen: Secondary | ICD-10-CM | POA: Diagnosis present

## 2015-07-20 DIAGNOSIS — E1065 Type 1 diabetes mellitus with hyperglycemia: Secondary | ICD-10-CM

## 2015-07-20 DIAGNOSIS — Z87891 Personal history of nicotine dependence: Secondary | ICD-10-CM

## 2015-07-20 DIAGNOSIS — E1042 Type 1 diabetes mellitus with diabetic polyneuropathy: Secondary | ICD-10-CM | POA: Diagnosis present

## 2015-07-20 DIAGNOSIS — N179 Acute kidney failure, unspecified: Secondary | ICD-10-CM

## 2015-07-20 DIAGNOSIS — J45909 Unspecified asthma, uncomplicated: Secondary | ICD-10-CM | POA: Diagnosis present

## 2015-07-20 DIAGNOSIS — N184 Chronic kidney disease, stage 4 (severe): Secondary | ICD-10-CM | POA: Diagnosis present

## 2015-07-20 DIAGNOSIS — E871 Hypo-osmolality and hyponatremia: Secondary | ICD-10-CM | POA: Diagnosis present

## 2015-07-20 DIAGNOSIS — K3184 Gastroparesis: Secondary | ICD-10-CM | POA: Diagnosis present

## 2015-07-20 DIAGNOSIS — K219 Gastro-esophageal reflux disease without esophagitis: Secondary | ICD-10-CM | POA: Diagnosis present

## 2015-07-20 DIAGNOSIS — E039 Hypothyroidism, unspecified: Secondary | ICD-10-CM | POA: Diagnosis present

## 2015-07-20 DIAGNOSIS — Z882 Allergy status to sulfonamides status: Secondary | ICD-10-CM

## 2015-07-20 DIAGNOSIS — F1290 Cannabis use, unspecified, uncomplicated: Secondary | ICD-10-CM | POA: Diagnosis present

## 2015-07-20 DIAGNOSIS — E785 Hyperlipidemia, unspecified: Secondary | ICD-10-CM | POA: Diagnosis present

## 2015-07-20 DIAGNOSIS — E1051 Type 1 diabetes mellitus with diabetic peripheral angiopathy without gangrene: Secondary | ICD-10-CM

## 2015-07-20 DIAGNOSIS — Z9111 Patient's noncompliance with dietary regimen: Secondary | ICD-10-CM | POA: Diagnosis present

## 2015-07-20 DIAGNOSIS — I129 Hypertensive chronic kidney disease with stage 1 through stage 4 chronic kidney disease, or unspecified chronic kidney disease: Secondary | ICD-10-CM | POA: Diagnosis present

## 2015-07-20 DIAGNOSIS — E1010 Type 1 diabetes mellitus with ketoacidosis without coma: Principal | ICD-10-CM

## 2015-07-20 DIAGNOSIS — D631 Anemia in chronic kidney disease: Secondary | ICD-10-CM | POA: Diagnosis present

## 2015-07-20 LAB — BASIC METABOLIC PANEL
Anion gap: 21 — ABNORMAL HIGH (ref 5–15)
Anion gap: 22 — ABNORMAL HIGH (ref 5–15)
BUN: 43 mg/dL — AB (ref 6–20)
BUN: 46 mg/dL — ABNORMAL HIGH (ref 6–20)
CALCIUM: 9.3 mg/dL (ref 8.9–10.3)
CO2: 20 mmol/L — ABNORMAL LOW (ref 22–32)
CO2: 20 mmol/L — ABNORMAL LOW (ref 22–32)
CREATININE: 4.59 mg/dL — AB (ref 0.44–1.00)
Calcium: 9.4 mg/dL (ref 8.9–10.3)
Chloride: 87 mmol/L — ABNORMAL LOW (ref 101–111)
Chloride: 88 mmol/L — ABNORMAL LOW (ref 101–111)
Creatinine, Ser: 4.43 mg/dL — ABNORMAL HIGH (ref 0.44–1.00)
GFR, EST AFRICAN AMERICAN: 14 mL/min — AB (ref >60)
GFR, EST AFRICAN AMERICAN: 14 mL/min — AB (ref >60)
GFR, EST NON AFRICAN AMERICAN: 12 mL/min — AB (ref >60)
GFR, EST NON AFRICAN AMERICAN: 12 mL/min — AB (ref >60)
GLUCOSE: 464 mg/dL — AB (ref 65–99)
Glucose, Bld: 477 mg/dL — ABNORMAL HIGH (ref 65–99)
POTASSIUM: 4.4 mmol/L (ref 3.5–5.1)
Potassium: 4.4 mmol/L (ref 3.5–5.1)
SODIUM: 128 mmol/L — AB (ref 135–145)
Sodium: 130 mmol/L — ABNORMAL LOW (ref 135–145)

## 2015-07-20 LAB — CBG MONITORING, ED
GLUCOSE-CAPILLARY: 436 mg/dL — AB (ref 65–99)
GLUCOSE-CAPILLARY: 383 mg/dL — AB (ref 65–99)
Glucose-Capillary: 293 mg/dL — ABNORMAL HIGH (ref 65–99)

## 2015-07-20 LAB — I-STAT TROPONIN, ED: Troponin i, poc: 0 ng/mL (ref 0.00–0.08)

## 2015-07-20 LAB — URINE MICROSCOPIC-ADD ON

## 2015-07-20 LAB — I-STAT CG4 LACTIC ACID, ED: Lactic Acid, Venous: 2.09 mmol/L (ref 0.5–2.0)

## 2015-07-20 LAB — URINALYSIS, ROUTINE W REFLEX MICROSCOPIC
BILIRUBIN URINE: NEGATIVE
KETONES UR: 40 mg/dL — AB
Nitrite: NEGATIVE
PROTEIN: 100 mg/dL — AB
SPECIFIC GRAVITY, URINE: 1.013 (ref 1.005–1.030)
UROBILINOGEN UA: 0.2 mg/dL (ref 0.0–1.0)
pH: 5.5 (ref 5.0–8.0)

## 2015-07-20 LAB — I-STAT ARTERIAL BLOOD GAS, ED
ACID-BASE DEFICIT: 1 mmol/L (ref 0.0–2.0)
Bicarbonate: 23.7 mEq/L (ref 20.0–24.0)
O2 SAT: 95 %
PCO2 ART: 40.1 mmHg (ref 35.0–45.0)
PO2 ART: 76 mmHg — AB (ref 80.0–100.0)
TCO2: 25 mmol/L (ref 0–100)
pH, Arterial: 7.38 (ref 7.350–7.450)

## 2015-07-20 LAB — CBC WITH DIFFERENTIAL/PLATELET
BASOS ABS: 0 10*3/uL (ref 0.0–0.1)
BASOS PCT: 0 % (ref 0–1)
EOS PCT: 1 % (ref 0–5)
Eosinophils Absolute: 0 10*3/uL (ref 0.0–0.7)
HCT: 28.1 % — ABNORMAL LOW (ref 36.0–46.0)
HEMOGLOBIN: 9.1 g/dL — AB (ref 12.0–15.0)
Lymphocytes Relative: 39 % (ref 12–46)
Lymphs Abs: 2.3 10*3/uL (ref 0.7–4.0)
MCH: 25.3 pg — ABNORMAL LOW (ref 26.0–34.0)
MCHC: 32.4 g/dL (ref 30.0–36.0)
MCV: 78.3 fL (ref 78.0–100.0)
MONO ABS: 0.5 10*3/uL (ref 0.1–1.0)
MONOS PCT: 8 % (ref 3–12)
Neutro Abs: 3.1 10*3/uL (ref 1.7–7.7)
Neutrophils Relative %: 52 % (ref 43–77)
Platelets: 164 10*3/uL (ref 150–400)
RBC: 3.59 MIL/uL — ABNORMAL LOW (ref 3.87–5.11)
RDW: 13.5 % (ref 11.5–15.5)
WBC: 5.9 10*3/uL (ref 4.0–10.5)

## 2015-07-20 LAB — GLUCOSE, CAPILLARY
GLUCOSE-CAPILLARY: 206 mg/dL — AB (ref 65–99)
Glucose-Capillary: 139 mg/dL — ABNORMAL HIGH (ref 65–99)

## 2015-07-20 LAB — TROPONIN I

## 2015-07-20 LAB — MRSA PCR SCREENING: MRSA by PCR: NEGATIVE

## 2015-07-20 MED ORDER — DEXTROSE-NACL 5-0.45 % IV SOLN: INTRAVENOUS | Status: DC

## 2015-07-20 MED ORDER — GABAPENTIN 300 MG PO CAPS
600.0000 mg | ORAL_CAPSULE | Freq: Every day | ORAL | Status: DC
  Administered 2015-07-20 – 2015-07-23 (×4): 600 mg via ORAL
  Filled 2015-07-20 (×6): qty 2

## 2015-07-20 MED ORDER — LEVOTHYROXINE SODIUM 100 MCG IV SOLR
75.0000 ug | Freq: Every day | INTRAVENOUS | Status: DC
  Administered 2015-07-20: 75 ug via INTRAVENOUS
  Filled 2015-07-20 (×2): qty 5

## 2015-07-20 MED ORDER — SODIUM CHLORIDE 0.9 % IV SOLN
INTRAVENOUS | Status: DC
  Administered 2015-07-20: 2.3 [IU]/h via INTRAVENOUS
  Filled 2015-07-20: qty 2.5

## 2015-07-20 MED ORDER — SODIUM CHLORIDE 0.9 % IV BOLUS (SEPSIS)
1000.0000 mL | Freq: Once | INTRAVENOUS | Status: AC
  Administered 2015-07-20: 1000 mL via INTRAVENOUS

## 2015-07-20 MED ORDER — ACETAMINOPHEN 325 MG PO TABS
650.0000 mg | ORAL_TABLET | Freq: Four times a day (QID) | ORAL | Status: DC | PRN
  Administered 2015-07-23: 650 mg via ORAL
  Filled 2015-07-20: qty 2

## 2015-07-20 MED ORDER — METOCLOPRAMIDE HCL 5 MG/ML IJ SOLN
5.0000 mg | Freq: Four times a day (QID) | INTRAMUSCULAR | Status: DC
  Administered 2015-07-20 – 2015-07-21 (×3): 5 mg via INTRAVENOUS
  Filled 2015-07-20 (×7): qty 1

## 2015-07-20 MED ORDER — ONDANSETRON HCL 4 MG/2ML IJ SOLN
4.0000 mg | Freq: Once | INTRAMUSCULAR | Status: AC
  Administered 2015-07-20: 4 mg via INTRAVENOUS
  Filled 2015-07-20: qty 2

## 2015-07-20 MED ORDER — INSULIN ASPART 100 UNIT/ML IV SOLN
INTRAVENOUS | Status: AC
  Filled 2015-07-20: qty 1

## 2015-07-20 MED ORDER — INSULIN ASPART 100 UNIT/ML ~~LOC~~ SOLN
SUBCUTANEOUS | Status: AC
Start: 2015-07-20 — End: 2015-07-21
  Filled 2015-07-20: qty 1

## 2015-07-20 MED ORDER — SODIUM CHLORIDE 0.9 % IV SOLN: INTRAVENOUS | Status: DC

## 2015-07-20 MED ORDER — PANTOPRAZOLE SODIUM 40 MG IV SOLR
40.0000 mg | INTRAVENOUS | Status: DC
  Administered 2015-07-20: 40 mg via INTRAVENOUS
  Filled 2015-07-20 (×2): qty 40

## 2015-07-20 MED ORDER — INSULIN ASPART 100 UNIT/ML ~~LOC~~ SOLN
10.0000 [IU] | Freq: Once | SUBCUTANEOUS | Status: AC
  Administered 2015-07-20: 10 [IU] via INTRAVENOUS
  Filled 2015-07-20: qty 1

## 2015-07-20 MED ORDER — DEXTROSE-NACL 5-0.45 % IV SOLN
INTRAVENOUS | Status: DC
  Administered 2015-07-20: 21:00:00 via INTRAVENOUS

## 2015-07-20 MED ORDER — SODIUM CHLORIDE 0.9 % IV SOLN
INTRAVENOUS | Status: DC
  Administered 2015-07-21 – 2015-07-22 (×2): via INTRAVENOUS

## 2015-07-20 NOTE — ED Notes (Signed)
Attempted report 

## 2015-07-20 NOTE — ED Notes (Signed)
CBG 293 
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
## 2015-07-20 NOTE — Progress Notes (Signed)
MD Sarajane Jews made aware that stabilizer stated to run Insulin at 0 after CBG of 139. D5 0.45NS increased to 133ml/hr per MD. Will recheck in one hour.
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
## 2015-07-20 NOTE — ED Notes (Signed)
Pt in c/o shortness of breath, chest pain, and hyperglycemia, states her sugar has been in the 400s at home, pt also has stage 4 kidney disease and is supposed to start dialysis next month, pt alert, appears to be having trouble breathing but speaking in full sentences when talking, pt pale, skin dry

## 2015-07-20 NOTE — ED Notes (Signed)
Pt brought back to room via wheelchair; pt undressed, in gown, on monitor, continuous pulse oximetry and blood pressure cuff; CBG performed;  Lenna Sciara I, RN present in room

## 2015-07-21 ENCOUNTER — Encounter (HOSPITAL_COMMUNITY): Payer: Self-pay

## 2015-07-21 DIAGNOSIS — G990 Autonomic neuropathy in diseases classified elsewhere: Secondary | ICD-10-CM

## 2015-07-21 DIAGNOSIS — N189 Chronic kidney disease, unspecified: Secondary | ICD-10-CM

## 2015-07-21 DIAGNOSIS — E0810 Diabetes mellitus due to underlying condition with ketoacidosis without coma: Secondary | ICD-10-CM

## 2015-07-21 DIAGNOSIS — E1043 Type 1 diabetes mellitus with diabetic autonomic (poly)neuropathy: Secondary | ICD-10-CM

## 2015-07-21 DIAGNOSIS — E1042 Type 1 diabetes mellitus with diabetic polyneuropathy: Secondary | ICD-10-CM

## 2015-07-21 DIAGNOSIS — D631 Anemia in chronic kidney disease: Secondary | ICD-10-CM

## 2015-07-21 DIAGNOSIS — K3184 Gastroparesis: Secondary | ICD-10-CM

## 2015-07-21 LAB — BASIC METABOLIC PANEL
Anion gap: 9 (ref 5–15)
Anion gap: 10 (ref 5–15)
BUN: 37 mg/dL — ABNORMAL HIGH (ref 6–20)
BUN: 36 mg/dL — ABNORMAL HIGH (ref 6–20)
CALCIUM: 8.5 mg/dL — AB (ref 8.9–10.3)
CO2: 26 mmol/L (ref 22–32)
CO2: 26 mmol/L (ref 22–32)
CREATININE: 3.45 mg/dL — AB (ref 0.44–1.00)
Calcium: 8.6 mg/dL — ABNORMAL LOW (ref 8.9–10.3)
Chloride: 100 mmol/L — ABNORMAL LOW (ref 101–111)
Chloride: 98 mmol/L — ABNORMAL LOW (ref 101–111)
Creatinine, Ser: 3.59 mg/dL — ABNORMAL HIGH (ref 0.44–1.00)
GFR calc non Af Amer: 16 mL/min — ABNORMAL LOW (ref >60)
GFR, EST AFRICAN AMERICAN: 18 mL/min — AB (ref >60)
GFR, EST AFRICAN AMERICAN: 19 mL/min — AB (ref >60)
GFR, EST NON AFRICAN AMERICAN: 17 mL/min — AB (ref >60)
Glucose, Bld: 160 mg/dL — ABNORMAL HIGH (ref 65–99)
Glucose, Bld: 160 mg/dL — ABNORMAL HIGH (ref 65–99)
POTASSIUM: 3.6 mmol/L (ref 3.5–5.1)
Potassium: 3.6 mmol/L (ref 3.5–5.1)
SODIUM: 135 mmol/L (ref 135–145)
Sodium: 134 mmol/L — ABNORMAL LOW (ref 135–145)

## 2015-07-21 LAB — GLUCOSE, CAPILLARY
GLUCOSE-CAPILLARY: 151 mg/dL — AB (ref 65–99)
GLUCOSE-CAPILLARY: 110 mg/dL — AB (ref 65–99)
GLUCOSE-CAPILLARY: 39 mg/dL — AB (ref 65–99)
GLUCOSE-CAPILLARY: 54 mg/dL — AB (ref 65–99)
GLUCOSE-CAPILLARY: 201 mg/dL — AB (ref 65–99)
GLUCOSE-CAPILLARY: 69 mg/dL (ref 65–99)
Glucose-Capillary: 140 mg/dL — ABNORMAL HIGH (ref 65–99)
Glucose-Capillary: 149 mg/dL — ABNORMAL HIGH (ref 65–99)
Glucose-Capillary: 150 mg/dL — ABNORMAL HIGH (ref 65–99)
Glucose-Capillary: 155 mg/dL — ABNORMAL HIGH (ref 65–99)
Glucose-Capillary: 37 mg/dL — CL (ref 65–99)
Glucose-Capillary: 87 mg/dL (ref 65–99)

## 2015-07-21 LAB — TROPONIN I: Troponin I: <0.03 ng/mL (ref <0.031)

## 2015-07-21 LAB — PREGNANCY, URINE: Preg Test, Ur: NEGATIVE

## 2015-07-21 LAB — LACTIC ACID, PLASMA: Lactic Acid, Venous: 0.7 mmol/L (ref 0.5–2.0)

## 2015-07-21 LAB — LIPASE, BLOOD: LIPASE: 16 U/L — AB (ref 22–51)

## 2015-07-21 MED ORDER — INSULIN ASPART 100 UNIT/ML ~~LOC~~ SOLN
0.0000 [IU] | Freq: Three times a day (TID) | SUBCUTANEOUS | Status: DC
  Administered 2015-07-21 – 2015-07-22 (×2): 2 [IU] via SUBCUTANEOUS
  Administered 2015-07-23: 3 [IU] via SUBCUTANEOUS
  Administered 2015-07-23 (×2): 5 [IU] via SUBCUTANEOUS
  Administered 2015-07-24: 8 [IU] via SUBCUTANEOUS

## 2015-07-21 MED ORDER — INSULIN ASPART 100 UNIT/ML ~~LOC~~ SOLN
10.0000 [IU] | Freq: Three times a day (TID) | SUBCUTANEOUS | Status: DC
  Administered 2015-07-21 (×2): 10 [IU] via SUBCUTANEOUS

## 2015-07-21 MED ORDER — METOCLOPRAMIDE HCL 10 MG PO TABS
5.0000 mg | ORAL_TABLET | Freq: Three times a day (TID) | ORAL | Status: DC
  Administered 2015-07-21 – 2015-07-24 (×9): 5 mg via ORAL
  Filled 2015-07-21 (×11): qty 1

## 2015-07-21 MED ORDER — INSULIN GLARGINE 100 UNIT/ML ~~LOC~~ SOLN
25.0000 [IU] | Freq: Every day | SUBCUTANEOUS | Status: DC
Start: 2015-07-21 — End: 2015-07-21
  Filled 2015-07-21: qty 0.25

## 2015-07-21 MED ORDER — PANTOPRAZOLE SODIUM 40 MG PO TBEC
40.0000 mg | DELAYED_RELEASE_TABLET | Freq: Every day | ORAL | Status: DC
  Administered 2015-07-21 – 2015-07-23 (×3): 40 mg via ORAL
  Filled 2015-07-21 (×3): qty 1

## 2015-07-21 MED ORDER — INSULIN GLARGINE 100 UNIT/ML ~~LOC~~ SOLN
10.0000 [IU] | Freq: Every day | SUBCUTANEOUS | Status: DC
  Administered 2015-07-21: 10 [IU] via SUBCUTANEOUS
  Filled 2015-07-21 (×3): qty 0.1

## 2015-07-21 MED ORDER — DEXTROSE 50 % IV SOLN
25.0000 mL | Freq: Once | INTRAVENOUS | Status: AC
  Administered 2015-07-21: 25 mL via INTRAVENOUS
  Filled 2015-07-21: qty 50

## 2015-07-21 MED ORDER — ENOXAPARIN SODIUM 30 MG/0.3ML ~~LOC~~ SOLN
30.0000 mg | SUBCUTANEOUS | Status: DC
  Administered 2015-07-21 – 2015-07-23 (×3): 30 mg via SUBCUTANEOUS
  Filled 2015-07-21 (×3): qty 0.3

## 2015-07-21 MED ORDER — INSULIN GLARGINE 100 UNIT/ML ~~LOC~~ SOLN
20.0000 [IU] | Freq: Every day | SUBCUTANEOUS | Status: DC
  Administered 2015-07-21: 20 [IU] via SUBCUTANEOUS
  Filled 2015-07-21: qty 0.2

## 2015-07-21 MED ORDER — INSULIN ASPART 100 UNIT/ML ~~LOC~~ SOLN
0.0000 [IU] | Freq: Every day | SUBCUTANEOUS | Status: DC
Start: 2015-07-21 — End: 2015-07-23

## 2015-07-21 MED ORDER — INSULIN ASPART 100 UNIT/ML ~~LOC~~ SOLN: 15.0000 [IU] | Freq: Three times a day (TID) | SUBCUTANEOUS | Status: DC

## 2015-07-21 MED ORDER — INSULIN GLARGINE 100 UNIT/ML ~~LOC~~ SOLN
15.0000 [IU] | Freq: Every day | SUBCUTANEOUS | Status: DC
  Filled 2015-07-21: qty 0.15

## 2015-07-21 MED ORDER — LEVOTHYROXINE SODIUM 150 MCG PO TABS
150.0000 ug | ORAL_TABLET | Freq: Every day | ORAL | Status: DC
  Administered 2015-07-21 – 2015-07-24 (×4): 150 ug via ORAL
  Filled 2015-07-21 (×5): qty 1

## 2015-07-21 NOTE — Clinical Documentation Improvement (Signed)
   After study, please clarify abnormal lab values in progress notes and discharge summary  Possible Clinical Conditions?   Hyponatremia                               Other Condition  Cannot Clinically Determine     Supporting Information:  Acute on Chronic renal failure, Diabetes I, dehydration Low po intake  Component     Latest Ref Rng 07/20/2015 07/20/2015 07/21/2015 07/21/2015         4:50 PM  8:47 PM  1:17 AM  2:20 AM  Sodium     135 - 145 mmol/L 128 (L) 130 (L) 135 134 (L)   Treatment IV NS@75ml /h Monitoring BMP  Thank You, Shishir Krantz T. Pricilla Handler, MSN, MBA/MHA Clinical Documentation Specialist [email or web address] Office # [phone]

## 2015-07-21 NOTE — Progress Notes (Signed)
PATIENT DETAILS Name: Angela [name] Age: 29 y.o. Sex: female Date of Birth: 1986-05-20 Admit Date: 07/20/2015 Admitting Physician Samuella Cota, MD [id number], Lennox Laity, MD  Subjective: Feels much better this morning. No vomiting-asking for food.  Assessment/Plan: Principal Problem: DKA (diabetic ketoacidoses): Resolved with IV fluids and insulin drip. Transitioned to Lantus and NovoLog.  Active Problems: Acute on chronic kidney disease stage IV: Acute renal failure secondary to above. Although improved, creatinine not yet back to baseline, continue IV fluids and recheck electrolytes in a.m.  Chest pain: Resolved, likely GI related from numerous episodes of vomiting. Troponins negative, suspect no further workup required. Continue PPI  History of gastroparesis: Continue Reglan-but change to oral. Emphasized importance of small portion meals.  Anemia: Suspect mostly from chronic kidney disease-however somewhat microcytic as well. Since this is a chronic issue, and no overt blood loss-defer further workup to the outpatient setting  Hypothyroidism: Continue with levothyroxine-have changed to oral since vomiting has resolved  Diabetic peripheral neuropathy associated with type 1 diabetes mellitus: Stable-continue with Neurontin  Disposition: Remain inpatient-transfer to med surg-home in am-once renal function has further improved. Advance diet  Antimicrobial agents  See below  Anti-infectives    None      DVT Prophylaxis: Prophylactic Lovenox   Code Status: Full code   Family Communication Family member at bedside  Procedures: None  CONSULTS:  None  Time spent 30 minutes-Greater than 50% of this time was spent in counseling, explanation of diagnosis, planning of further management, and coordination of care.  MEDICATIONS: Scheduled Meds: . gabapentin  600 mg Oral QHS  . insulin aspart  0-15 Units Subcutaneous TID WC  . insulin aspart   0-5 Units Subcutaneous QHS  . insulin aspart  10 Units Subcutaneous TID WC  . insulin glargine  25 Units Subcutaneous QHS  . levothyroxine  150 mcg Oral QAC breakfast  . metoCLOPramide (REGLAN) injection  5 mg Intravenous 4 times per day  . pantoprazole  40 mg Oral QAC supper   Continuous Infusions: . sodium chloride 100 mL/hr at 07/21/15 0309   PRN Meds:.acetaminophen    PHYSICAL EXAM: Vital signs in last 24 hours: Filed Vitals:   07/21/15 0400 07/21/15 0725 07/21/15 0800 07/21/15 1000  BP: 113/74 114/75 132/88 119/86  Pulse: 82 81 84 93  Temp: 98 F (36.7 C) 98.8 F (37.1 C)    TempSrc: Axillary Oral    Resp: 8 12 13 15   Height:      Weight:      SpO2: 99% 98% 100% 95%    Weight change:  Filed Weights   07/20/15 1640 07/20/15 2100  Weight: 63.05 kg (139 lb) 60.3 kg (132 lb 15 oz)   Body mass index is 22.12 kg/(m^2).   Gen Exam: Awake and alert with clear speech.  Neck: Supple, No JVD.   Chest: B/L Clear.   CVS: S1 S2 Regular, no murmurs.  Abdomen: soft, BS +, non tender, non distended.  Extremities: no edema, lower extremities warm to touch. Neurologic: Non Focal.   Skin: No Rash.   Wounds: N/A.   Intake/Output from previous day:  Intake/Output Summary (Last 24 hours) at 07/21/15 1056 Last data filed at 07/21/15 0930  Gross per 24 hour  Intake    120 ml  Output      0 ml  Net    120 ml     LAB RESULTS: CBC  Recent  Labs Lab 07/20/15 1650  WBC 5.9  HGB 9.1*  HCT 28.1*  PLT 164  MCV 78.3  MCH 25.3*  MCHC 32.4  RDW 13.5  LYMPHSABS 2.3  MONOABS 0.5  EOSABS 0.0  BASOSABS 0.0    Chemistries   Recent Labs Lab 07/20/15 1650 07/20/15 2047 07/21/15 0117 07/21/15 0220  NA 128* 130* 135 134*  K 4.4 4.4 3.6 3.6  CL 87* 88* 100* 98*  CO2 20* 20* 26 26  GLUCOSE 477* 464* 160* 160*  BUN 43* 46* 37* 36*  CREATININE 4.59* 4.43* 3.59* 3.45*  CALCIUM 9.3 9.4 8.6* 8.5*    CBG:  Recent Labs Lab 07/20/15 2321 07/21/15 0025 07/21/15 0131  07/21/15 0318 07/21/15 0723  GLUCAP 140* 155* 149* 151* 87    GFR Estimated Creatinine Clearance: 21.5 mL/min (by C-G formula based on Cr of 3.45).  Coagulation profile No results for input(s): INR, PROTIME in the last 168 hours.  Cardiac Enzymes  Recent Labs Lab 07/20/15 2047 07/21/15 0220 07/21/15 0855  TROPONINI <0.03 <0.03 <0.03    Invalid input(s): POCBNP No results for input(s): DDIMER in the last 72 hours. No results for input(s): HGBA1C in the last 72 hours. No results for input(s): CHOL, HDL, LDLCALC, TRIG, CHOLHDL, LDLDIRECT in the last 72 hours. No results for input(s): TSH, T4TOTAL, T3FREE, THYROIDAB in the last 72 hours.  Invalid input(s): FREET3 No results for input(s): VITAMINB12, FOLATE, FERRITIN, TIBC, IRON, RETICCTPCT in the last 72 hours.  Recent Labs  07/21/15 0220  LIPASE 16*    Urine Studies No results for input(s): UHGB, CRYS in the last 72 hours.  Invalid input(s): UACOL, UAPR, USPG, UPH, UTP, UGL, UKET, UBIL, UNIT, UROB, ULEU, UEPI, UWBC, URBC, UBAC, CAST, UCOM, BILUA  MICROBIOLOGY: Recent Results (from the past 240 hour(s))  MRSA PCR Screening     Status: None   Collection Time: 07/20/15  9:00 PM  Result Value Ref Range Status   MRSA by PCR NEGATIVE NEGATIVE Final    Comment:        The GeneXpert MRSA Assay (FDA approved for NASAL specimens only), is one component of a comprehensive MRSA colonization surveillance program. It is not intended to diagnose MRSA infection nor to guide or monitor treatment for MRSA infections.     RADIOLOGY STUDIES/RESULTS: Dg Chest 2 View  07/20/2015   CLINICAL DATA:  Vomiting, centralized chest pain since this morning  EXAM: CHEST  2 VIEW  COMPARISON:  02/13/2015  FINDINGS: The heart size and mediastinal contours are within normal limits. Both lungs are clear. The visualized skeletal structures are unremarkable.  IMPRESSION: No active cardiopulmonary disease.   Electronically Signed   By: Skipper Cliche M.D.   On: 07/20/2015 16:57    Oren Binet, MD  [facility] Pager:[phone]  If 7PM-7AM, please contact night-coverage [email or web address] Password [email or web address] 07/21/2015, 10:56 AM   LOS: 1 day

## 2015-07-21 NOTE — Progress Notes (Signed)
Patient given OJ and graham cracker and peanut butter.  Baltazar Najjar NP notified about patient low blood sugar waiting for return call

## 2015-07-21 NOTE — Progress Notes (Signed)
Report completed to RN Romie Minus on [address], pt will transfer via wheelchair with belongings, with family, escorted by unit NT.

## 2015-07-21 NOTE — Progress Notes (Signed)
Patient has had peanut butter crackers, soup, meal tray and pack of cracker blood sugar still 54 will re-page Baltazar Najjar NP

## 2015-07-21 NOTE — Progress Notes (Signed)
NP made aware that pts gap is now 9 and pts last 3 sugars were below 150. Insulin gtt is running at 0.5. Will monitor pt.

## 2015-07-21 NOTE — Progress Notes (Signed)
Utilization review completed. Navin Dogan, RN, BSN. 

## 2015-07-21 NOTE — Progress Notes (Signed)
Pt arrived to room. A&Ox4. No signs of distress. SCDs on. Fam at bedside.

## 2015-07-21 NOTE — Progress Notes (Signed)
Pt to transfer to [facility] unit, pt will transfer to [place], [id number]. Attempted to call report to unit RN, not available at this time, CN not available at this time to receive report for other RN. Unit NS will give number to receiving RN to return call for report.

## 2015-07-22 DIAGNOSIS — N179 Acute kidney failure, unspecified: Secondary | ICD-10-CM

## 2015-07-22 LAB — GLUCOSE, CAPILLARY
GLUCOSE-CAPILLARY: 91 mg/dL (ref 65–99)
GLUCOSE-CAPILLARY: 150 mg/dL — AB (ref 65–99)
GLUCOSE-CAPILLARY: 37 mg/dL — AB (ref 65–99)
GLUCOSE-CAPILLARY: 75 mg/dL (ref 65–99)
Glucose-Capillary: 196 mg/dL — ABNORMAL HIGH (ref 65–99)
Glucose-Capillary: 87 mg/dL (ref 65–99)
Glucose-Capillary: 46 mg/dL — ABNORMAL LOW (ref 65–99)

## 2015-07-22 LAB — BASIC METABOLIC PANEL
Anion gap: 5 (ref 5–15)
BUN: 19 mg/dL (ref 6–20)
CALCIUM: 8.6 mg/dL — AB (ref 8.9–10.3)
CO2: 26 mmol/L (ref 22–32)
CREATININE: 2.32 mg/dL — AB (ref 0.44–1.00)
Chloride: 107 mmol/L (ref 101–111)
GFR calc Af Amer: 31 mL/min — ABNORMAL LOW (ref >60)
GFR calc non Af Amer: 27 mL/min — ABNORMAL LOW (ref >60)
GLUCOSE: 148 mg/dL — AB (ref 65–99)
Potassium: 4.2 mmol/L (ref 3.5–5.1)
Sodium: 138 mmol/L (ref 135–145)

## 2015-07-22 MED ORDER — INSULIN ASPART 100 UNIT/ML ~~LOC~~ SOLN
6.0000 [IU] | Freq: Three times a day (TID) | SUBCUTANEOUS | Status: DC
  Administered 2015-07-22: 6 [IU] via SUBCUTANEOUS

## 2015-07-22 NOTE — Hospital Discharge Follow-Up (Signed)
Transitional Care Clinic Care Coordination Note:  Admit date:  07/20/15 Discharge date:  ? 07/23/15 Discharge Disposition:  Home with family. Patient contact: [phone] (mobile) Emergency contact(s):  Zenaida Deed (mother)-[phone]  This Case Manager reviewed patient's EMR and determined patient would benefit from post-discharge medical management and chronic care management services through the [facility]. Patient has a history of type 1 diabetes mellitus with diabetic peripheral neuropathy, acute on chronic kidney disease-Stage IV, history of gastroparesis, anemia. Admitted with DKA. Patient has had 4 admissions in the last year. This Case Manager met with patient to discuss the services and medical management that can be provided at the [facility]. Patient verbalized understanding and agreed to receive post-discharge care at the [facility].   Patient scheduled for Transitional Care appointment on 07/28/15 at 1200 with Dr. Jarold Song.  Clinic information and appointment time provided to patient. Appointment information also placed on AVS.  Assessment:       Home Environment: Patient lives with her mother in a private residence.       Support System: Mother       Level of functioning: independent       Home DME: Patient independent with daily activities. She does indicate she has a glucometer and diabetes supplies.       Home care services: none       Transportation: Patient indicates she drives to appointments. No transportation barriers noted.         Food/Nutrition: Patient indicates her mother shops for and prepares all meals. Patient indicates she has access to needed food.        Medications:  Patient uses pharmacy at [place]. Patient denies problems obtaining needed medications. Thoroughly discussed pharmacy resources available at [place].       Identified Barriers: lack of insurance. Patient  indicates she applied for Medicaid last month. Awaiting to hear if she qualifies. Informed patient of Development worker, community at [facility] and provided patient with [facility] walk-in hours. Patient appreciative of information.        PCP: Dr. Lenard [facility] [place]             Arranged services:        Services communicated to Tomi Bamberger, RN CM

## 2015-07-22 NOTE — Care Management Note (Signed)
Case Management Note  Patient Details  Name: Angela [name] MRN: [id number] Date of Birth: Mar 26, 1986  Subjective/Objective:       NCM spoke with patient , pta independent, patient has trasport at [employer], she sees Dr. Loma Messing at [facility] clinic, she has an apt set up already.  Patient has been on insulin pta.  NCM will cont to follow for dc needs.              Action/Plan:   Expected Discharge Date:                  Expected Discharge Plan:  Home/Self Care  In-House Referral:     Discharge planning Services  CM Consult  Post Acute Care Choice:    Choice offered to:     DME Arranged:    DME Agency:     HH Arranged:    HH Agency:     Status of Service:  In process, will continue to follow  Medicare Important Message Given:    Date Medicare IM Given:    Medicare IM give by:    Date Additional Medicare IM Given:    Additional Medicare Important Message give by:     If discussed at [place] of Stay Meetings, dates discussed:    Additional Comments:  Zenon Mayo, RN 07/22/2015, 3:06 PM

## 2015-07-22 NOTE — Progress Notes (Signed)
Inpatient Diabetes Program Recommendations  AACE/ADA: New Consensus Statement on Inpatient Glycemic Control (2013)  Target Ranges:  Prepandial:   less than 140 mg/dL      Peak postprandial:   less than 180 mg/dL (1-2 hours)      Critically ill patients:  140 - 180 mg/dL   Results for CIANI, QUARLES (MRN [id number]) as of 07/22/2015 09:21  Ref. Range 07/21/2015 17:00 07/21/2015 21:01  Glucose-Capillary Latest Ref Range: 65-99 mg/dL 110 (H) 39 (LL)   Note: Patient's glucose yesterday at 1700 was 110 mg/dl. Patient was given Novolog meal coverage of 10 units at 1943. Almost three hours after glucose was measured. Meal coverage needs to be given within an hour of a glucose check. Glucose needed to be measured before meal coverage was given. No changes to insulin rders today. Watch trends.   Thanks,  Tama Headings RN, MSN, [facility] Inpatient Diabetes Coordinator Team Pager [phone]

## 2015-07-22 NOTE — Progress Notes (Signed)
PATIENT DETAILS Name: Angela [name] Age: 29 y.o. Sex: female Date of Birth: [birth year] Admit Date: 07/20/2015 Admitting Physician Samuella Cota, MD [id number], Lennox Laity, MD  Subjective: Feels much better this morning. CBG low last night  Assessment/Plan: Principal Problem: DKA (diabetic ketoacidoses): Resolved with IV fluids and insulin drip. Transitioned to Lantus and NovoLog.  Active Problems: Type 1 DM: Suspect noncompliant to diet at home hence likely requires more insulin at home-given hypoglycemia, reduce pre-meal NovoLog to 6 units, continue Lantus 10 units and follow closely  Acute on chronic kidney disease stage IV: Acute renal failure secondary to above.  Significantlyimproved, creatinine  back to baseline  Chest pain: Resolved, likely GI related from numerous episodes of vomiting. Troponins negative, suspect no further workup required. Continue PPI  History of gastroparesis: Continue Reglan-but change to oral. Emphasized importance of small portion meals.  Anemia: Suspect mostly from chronic kidney disease-however somewhat microcytic as well. Since this is a chronic issue, and no overt blood loss-defer further workup to the outpatient setting  Hypothyroidism: Continue with levothyroxine  Diabetic peripheral neuropathy associated with type 1 diabetes mellitus: Stable-continue with Neurontin  Disposition: Remain inpatient-suspect home in a.m. if CBGs stable  Antimicrobial agents  See below  Anti-infectives    None      DVT Prophylaxis: Prophylactic Lovenox   Code Status: Full code   Family Communication Family member at bedside  Procedures: None  CONSULTS:  None  Time spent 25 minutes-Greater than 50% of this time was spent in counseling, explanation of diagnosis, planning of further management, and coordination of care.  MEDICATIONS: Scheduled Meds: . enoxaparin (LOVENOX) injection  30 mg Subcutaneous Q24H  . gabapentin   600 mg Oral QHS  . insulin aspart  0-15 Units Subcutaneous TID WC  . insulin aspart  0-5 Units Subcutaneous QHS  . insulin aspart  6 Units Subcutaneous TID WC  . insulin glargine  10 Units Subcutaneous QHS  . levothyroxine  150 mcg Oral QAC breakfast  . metoCLOPramide  5 mg Oral TID AC  . pantoprazole  40 mg Oral QAC supper   Continuous Infusions:   PRN Meds:.acetaminophen    PHYSICAL EXAM: Vital signs in last 24 hours: Filed Vitals:   07/21/15 1600 07/21/15 2116 07/22/15 0509 07/22/15 1331  BP: 127/83 129/85 119/83 164/112  Pulse: 86 97 86 90  Temp: 98.1 F (36.7 C) 97.9 F (36.6 C) 97.9 F (36.6 C) 97.5 F (36.4 C)  TempSrc: Oral Oral Oral Oral  Resp: 16 16 16 20   Height:      Weight:      SpO2: 100% 100% 100% 100%    Weight change:  Filed Weights   07/20/15 1640 07/20/15 2100  Weight: 63.05 kg (139 lb) 60.3 kg (132 lb 15 oz)   Body mass index is 22.12 kg/(m^2).   Gen Exam: Awake and alert with clear speech.  Neck: Supple, No JVD.   Chest: B/L Clear.   CVS: S1 S2 Regular, no murmurs.  Abdomen: soft, BS +, non tender, non distended.  Extremities: no edema, lower extremities warm to touch. Neurologic: Non Focal.   Skin: No Rash.   Wounds: N/A.   Intake/Output from previous day:  Intake/Output Summary (Last 24 hours) at 07/22/15 1717 Last data filed at 07/22/15 1635  Gross per 24 hour  Intake 1918.75 ml  Output      0 ml  Net 1918.75 ml  LAB RESULTS: CBC  Recent Labs Lab 07/20/15 1650  WBC 5.9  HGB 9.1*  HCT 28.1*  PLT 164  MCV 78.3  MCH 25.3*  MCHC 32.4  RDW 13.5  LYMPHSABS 2.3  MONOABS 0.5  EOSABS 0.0  BASOSABS 0.0    Chemistries   Recent Labs Lab 07/20/15 1650 07/20/15 2047 07/21/15 0117 07/21/15 0220 07/22/15 0646  NA 128* 130* 135 134* 138  K 4.4 4.4 3.6 3.6 4.2  CL 87* 88* 100* 98* 107  CO2 20* 20* 26 26 26   GLUCOSE 477* 464* 160* 160* 148*  BUN 43* 46* 37* 36* 19  CREATININE 4.59* 4.43* 3.59* 3.45* 2.32*    CALCIUM 9.3 9.4 8.6* 8.5* 8.6*    CBG:  Recent Labs Lab 07/21/15 2246 07/21/15 2314 07/22/15 0507 07/22/15 0820 07/22/15 1128  GLUCAP 69 201* 196* 87 91    GFR Estimated Creatinine Clearance: 31.9 mL/min (by C-G formula based on Cr of 2.32).  Coagulation profile No results for input(s): INR, PROTIME in the last 168 hours.  Cardiac Enzymes  Recent Labs Lab 07/20/15 2047 07/21/15 0220 07/21/15 0855  TROPONINI <0.03 <0.03 <0.03    Invalid input(s): POCBNP No results for input(s): DDIMER in the last 72 hours. No results for input(s): HGBA1C in the last 72 hours. No results for input(s): CHOL, HDL, LDLCALC, TRIG, CHOLHDL, LDLDIRECT in the last 72 hours. No results for input(s): TSH, T4TOTAL, T3FREE, THYROIDAB in the last 72 hours.  Invalid input(s): FREET3 No results for input(s): VITAMINB12, FOLATE, FERRITIN, TIBC, IRON, RETICCTPCT in the last 72 hours.  Recent Labs  07/21/15 0220  LIPASE 16*    Urine Studies No results for input(s): UHGB, CRYS in the last 72 hours.  Invalid input(s): UACOL, UAPR, USPG, UPH, UTP, UGL, UKET, UBIL, UNIT, UROB, ULEU, UEPI, UWBC, URBC, UBAC, CAST, UCOM, BILUA  MICROBIOLOGY: Recent Results (from the past 240 hour(s))  MRSA PCR Screening     Status: None   Collection Time: 07/20/15  9:00 PM  Result Value Ref Range Status   MRSA by PCR NEGATIVE NEGATIVE Final    Comment:        The GeneXpert MRSA Assay (FDA approved for NASAL specimens only), is one component of a comprehensive MRSA colonization surveillance program. It is not intended to diagnose MRSA infection nor to guide or monitor treatment for MRSA infections.   Urine culture     Status: None (Preliminary result)   Collection Time: 07/21/15  6:30 AM  Result Value Ref Range Status   Specimen Description URINE, CLEAN CATCH  Final   Special Requests NONE  Final   Culture CULTURE REINCUBATED FOR BETTER GROWTH  Final   Report Status PENDING  Incomplete    RADIOLOGY  STUDIES/RESULTS: Dg Chest 2 View  07/20/2015   CLINICAL DATA:  Vomiting, centralized chest pain since this morning  EXAM: CHEST  2 VIEW  COMPARISON:  02/13/2015  FINDINGS: The heart size and mediastinal contours are within normal limits. Both lungs are clear. The visualized skeletal structures are unremarkable.  IMPRESSION: No active cardiopulmonary disease.   Electronically Signed   By: Skipper Cliche M.D.   On: 07/20/2015 16:57    Oren Binet, MD  [facility] Pager:[phone]  If 7PM-7AM, please contact night-coverage [email or web address] Password [email or web address] 07/22/2015, 5:17 PM   LOS: 2 days

## 2015-07-23 LAB — GLUCOSE, CAPILLARY
GLUCOSE-CAPILLARY: 221 mg/dL — AB (ref 65–99)
GLUCOSE-CAPILLARY: 245 mg/dL — AB (ref 65–99)
GLUCOSE-CAPILLARY: 248 mg/dL — AB (ref 65–99)
GLUCOSE-CAPILLARY: 269 mg/dL — AB (ref 65–99)
Glucose-Capillary: 169 mg/dL — ABNORMAL HIGH (ref 65–99)

## 2015-07-23 LAB — URINE CULTURE

## 2015-07-23 MED ORDER — INSULIN GLARGINE 100 UNIT/ML ~~LOC~~ SOLN
8.0000 [IU] | Freq: Every day | SUBCUTANEOUS | Status: DC
  Administered 2015-07-23: 8 [IU] via SUBCUTANEOUS
  Filled 2015-07-23 (×2): qty 0.08

## 2015-07-23 MED ORDER — HYDRALAZINE HCL 20 MG/ML IJ SOLN
5.0000 mg | INTRAMUSCULAR | Status: DC | PRN
  Filled 2015-07-23: qty 1

## 2015-07-23 MED ORDER — AMLODIPINE BESYLATE 5 MG PO TABS
5.0000 mg | ORAL_TABLET | Freq: Every day | ORAL | Status: DC
  Administered 2015-07-23 – 2015-07-24 (×3): 5 mg via ORAL
  Filled 2015-07-23 (×3): qty 1

## 2015-07-23 NOTE — Progress Notes (Signed)
[facility] PROGRESS NOTE  Angela [name] [id number] DOB: 12-Nov-1986 DOA: 07/20/2015 PCP: Minerva Ends, MD  HPI/Subjective: Patient reports feeling well overall. Hypoglycemic last night.  Assessment/Plan: DKA (diabetic ketoacidosis): Resolved with IV fluids and insulin drip. Transitioned to Lantus and Novolog.   Type 1 DM: Hypoglycemic last night despite being on less than half her reported regular dose of insulin. Suspect non-compliant diet at home that requires more insulin, though she does report hypoglycemic episodes at home to 20-30s. Will allow for regular diet and monitor CBGs.Decrease Lantus to 8 units, stop scheduled Novolog.    Acute on chronic kidney disease, stage IV: Acute renal failure secondary to above, improved, creatinine at baseline  Hyponatremia:secondary to elevated CBG. Resolved.   Chest pain: Resolved, likely GI related from numerous episodes of vomiting. Troponins negative, no further work up required. Continue PPI.  Elevated blood pressure: Controlled - continue  amlodipine and hydralazine PRN.    History of gastroparesis: PO Reglan and encouraged small, frequent meals.  Anemia: Suspect mostly from chronic kidney disease, however somewhat microcytic. Since this is chronic with no obvious bleeding, follow-up outpatient  Hypothyroidism: Stable, on levothyroxine  Diabetic peripheral neuropathy associated with Type 1 diabetes mellitus: Stable, on Neurontin  Code Status: Full Family Communication: Family member at bedside Disposition Plan: Remain inpatient, discharge home when CBSs stable, like today or tomorrow DVT prophylaxis: SCDs, Lovenox   Consultants:  None  Procedures:  None  Antibiotics:  None    Objective: Filed Vitals:   07/23/15 0832  BP: 127/88  Pulse: 85  Temp: 98.6 F (37 C)  Resp: 19    Intake/Output Summary (Last 24 hours) at 07/23/15 0952 Last data filed at 07/22/15 1900  Gross per 24 hour  Intake 958.75  ml  Output      0 ml  Net 958.75 ml   Filed Weights   07/20/15 1640 07/20/15 2100  Weight: 63.05 kg (139 lb) 60.3 kg (132 lb 15 oz)    Exam:   General: Patient seated in bed, alert & oriented x 3 with clear, fluent speech   Cardiovascular: Normal S1 S2, regular rate and rhythm, no m/r/g, no LE edema  Respiratory: Clear to ausculation bilaterally, no w/r/r, normal respiratory effort  Abdomen: BS +, soft, non-tender, non-distended  Musculoskeletal: Grossly normal tone, moves all extremities appropriately  Data Reviewed: Basic Metabolic Panel:  Recent Labs Lab 07/20/15 1650 07/20/15 2047 07/21/15 0117 07/21/15 0220 07/22/15 0646  NA 128* 130* 135 134* 138  K 4.4 4.4 3.6 3.6 4.2  CL 87* 88* 100* 98* 107  CO2 20* 20* 26 26 26   GLUCOSE 477* 464* 160* 160* 148*  BUN 43* 46* 37* 36* 19  CREATININE 4.59* 4.43* 3.59* 3.45* 2.32*  CALCIUM 9.3 9.4 8.6* 8.5* 8.6*    Recent Labs Lab 07/21/15 0220  LIPASE 16*   CBC:  Recent Labs Lab 07/20/15 1650  WBC 5.9  NEUTROABS 3.1  HGB 9.1*  HCT 28.1*  MCV 78.3  PLT 164   Cardiac Enzymes:  Recent Labs Lab 07/20/15 2047 07/21/15 0220 07/21/15 0855  TROPONINI <0.03 <0.03 <0.03   CBG:  Recent Labs Lab 07/22/15 2122 07/22/15 2145 07/22/15 2201 07/23/15 0058 07/23/15 0742  GLUCAP 37* 46* 75 245* 248*    Recent Results (from the past 240 hour(s))  MRSA PCR Screening     Status: None   Collection Time: 07/20/15  9:00 PM  Result Value Ref Range Status   MRSA by PCR NEGATIVE NEGATIVE Final  Comment:        The GeneXpert MRSA Assay (FDA approved for NASAL specimens only), is one component of a comprehensive MRSA colonization surveillance program. It is not intended to diagnose MRSA infection nor to guide or monitor treatment for MRSA infections.   Urine culture     Status: None (Preliminary result)   Collection Time: 07/21/15  6:30 AM  Result Value Ref Range Status   Specimen Description URINE, CLEAN  CATCH  Final   Special Requests NONE  Final   Culture CULTURE REINCUBATED FOR BETTER GROWTH  Final   Report Status PENDING  Incomplete     Studies: No results found.  Scheduled Meds: . amLODipine  5 mg Oral Daily  . enoxaparin (LOVENOX) injection  30 mg Subcutaneous Q24H  . gabapentin  600 mg Oral QHS  . insulin aspart  0-15 Units Subcutaneous TID WC  . insulin aspart  0-5 Units Subcutaneous QHS  . insulin glargine  10 Units Subcutaneous QHS  . levothyroxine  150 mcg Oral QAC breakfast  . metoCLOPramide  5 mg Oral TID AC  . 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  I have personally reviewed the clinical findings, lab, imaging studies and management of this patient in detail. I agree with the documentation, as recorded by the PA  Patient continues to be hypoglycemic-decrease Lantus to 8 units, stop scheduled Novolog.Follow CBG's closely.  [facility]

## 2015-07-23 NOTE — Progress Notes (Addendum)
Hypoglycemic Event  CBG: 37  Treatment: 15 GM carbohydrate snack  Symptoms: None  Follow-up CBG: Time:2145 CBG Result;46  Possible Reasons for Event: Inadequate meal intake  Comments/MD notified:NP Penne Lash, Alleen Borne Sison  Remember to initiate Hypoglycemia Order Set & completeina

## 2015-07-24 LAB — GLUCOSE, CAPILLARY: Glucose-Capillary: 258 mg/dL — ABNORMAL HIGH (ref 65–99)

## 2015-07-24 MED ORDER — INSULIN ASPART 100 UNIT/ML ~~LOC~~ SOLN: 15.0000 [IU] | Freq: Three times a day (TID) | SUBCUTANEOUS | Status: DC

## 2015-07-24 MED ORDER — ASPIRIN EC 81 MG PO TBEC: 81.0000 mg | DELAYED_RELEASE_TABLET | Freq: Every day | ORAL | Status: DC

## 2015-07-24 NOTE — Discharge Instructions (Signed)
Diabetes Mellitus and Food It is important for you to manage your blood sugar (glucose) level. Your blood glucose level can be greatly affected by what you eat. Eating healthier foods in the appropriate amounts throughout the day at about the same time each day will help you control your blood glucose level. It can also help slow or prevent worsening of your diabetes mellitus. Healthy eating may even help you improve the level of your blood pressure and reach or maintain a healthy weight.  HOW CAN FOOD AFFECT ME? Carbohydrates Carbohydrates affect your blood glucose level more than any other type of food. Your dietitian will help you determine how many carbohydrates to eat at each meal and teach you how to count carbohydrates. Counting carbohydrates is important to keep your blood glucose at a healthy level, especially if you are using insulin or taking certain medicines for diabetes mellitus. Alcohol Alcohol can cause sudden decreases in blood glucose (hypoglycemia), especially if you use insulin or take certain medicines for diabetes mellitus. Hypoglycemia can be a life-threatening condition. Symptoms of hypoglycemia (sleepiness, dizziness, and disorientation) are similar to symptoms of having too much alcohol.  If your health care provider has given you approval to drink alcohol, do so in moderation and use the following guidelines:  Women should not have more than one drink per day, and men should not have more than two drinks per day. One drink is equal to:  12 oz of beer.  5 oz of wine.  1 oz of hard liquor.  Do not drink on an empty stomach.  Keep yourself hydrated. Have water, diet soda, or unsweetened iced tea.  Regular soda, juice, and other mixers might contain a lot of carbohydrates and should be counted. WHAT FOODS ARE NOT RECOMMENDED? As you make food choices, it is important to remember that all foods are not the same. Some foods have fewer nutrients per serving than other  foods, even though they might have the same number of calories or carbohydrates. It is difficult to get your body what it needs when you eat foods with fewer nutrients. Examples of foods that you should avoid that are high in calories and carbohydrates but low in nutrients include:  Trans fats (most processed foods list trans fats on the Nutrition Facts label).  Regular soda.  Juice.  Candy.  Sweets, such as cake, pie, doughnuts, and cookies.  Fried foods. WHAT FOODS CAN I EAT? Have nutrient-rich foods, which will nourish your body and keep you healthy. The food you should eat also will depend on several factors, including:  The calories you need.  The medicines you take.  Your weight.  Your blood glucose level.  Your blood pressure level.  Your cholesterol level. You also should eat a variety of foods, including:  Protein, such as meat, poultry, fish, tofu, nuts, and seeds (lean animal proteins are best).  Fruits.  Vegetables.  Dairy products, such as milk, cheese, and yogurt (low fat is best).  Breads, grains, pasta, cereal, rice, and beans.  Fats such as olive oil, trans fat-free margarine, canola oil, avocado, and olives. DOES EVERYONE WITH DIABETES MELLITUS HAVE THE SAME MEAL PLAN? Because every person with diabetes mellitus is different, there is not one meal plan that works for everyone. It is very important that you meet with a dietitian who will help you create a meal plan that is just right for you. Document Released: 09/01/2005 Document Revised: 12/10/2013 Document Reviewed: 11/01/2013 [employer] Patient Information 2015 [employer]. This   information is not intended to replace advice given to you by your health care provider. Make sure you discuss any questions you have with your health care provider.  

## 2015-07-24 NOTE — Progress Notes (Addendum)
Discharge teaching given to pt including activity, diet, medications, and follow-up appts. Pt verbalized understanding of all discharge instructions. IV access was d/c'd. Vitals are stable. Skin is intact except as charted in most recent assessments. Pt stated she did not need her insulin rx, has it at home. Told pt to call if needed. Pt to be escorted out by NT, to be driven home by family.

## 2015-07-24 NOTE — Discharge Summary (Signed)
Physician Discharge Summary  Angela [name] [id number] DOB: Jun 12, 1986 DOA: 07/20/2015  PCP: Minerva Ends, MD  Admit date: 07/20/2015 Discharge date: 07/24/2015  Time spent: 25 minutes  Recommendations for Outpatient Follow-up:  1. Continue 10mg  Lantus every day  2. Check blood sugar before each meal and use sliding scale to determine dose of Novolog 3. Begin taking 81mg  aspirin every day 4. Monitor diet, eat small frequent meals throughout the day 5. Follow up with Dr. Jarold Song at 12:00pm on 07/28/15 at [facility].  Discharge Diagnoses:  Principal Problem:   DKA (diabetic ketoacidoses) Active Problems:   CKD stage G3a/A3, GFR 45 - 59 and albumin creatinine ratio >300 mg/g   Diabetic gastroparesis associated with type 1 diabetes mellitus   Anemia in chronic renal disease   Diabetic peripheral neuropathy associated with type 1 diabetes mellitus   Dehydration   AKI (acute kidney injury)   Discharge Condition: Stable  Diet recommendation: Carb modified, heart healthy  Filed Weights   07/20/15 1640 07/20/15 2100  Weight: 63.05 kg (139 lb) 60.3 kg (132 lb 15 oz)    History of present illness:  29 year old patient with Type 1 Diabetes Mellitus, CKD stage IV, anemia in chronic renal disease, diabetic peripheral neuropathy, hypothyroidism, HTN, GERD and HLD who presented to the ED on 07/20/15 for vomiting, diarrhea, and hyperglycemia for 1-2 days. She also had an upper respiratory illness the week prior that has gradually improved with a fever that resolved a few days prior to admission. The vomiting developed on 07/19/15 with several episodes of non-bloody diarrhea and minimal abdominal pain. No aggravating or alleviating factors. She has been unable to eat and drink recently. She developed mild localized chest pain located over the sternal area x 1 day without specific aggravating or alleviating factors and some shortness of breath. While in the ED, she was  found to be in DKA with an acute on chronic kidney disease, stage IV. She was treated with IVF and insulin drip and admitted to inpatient on 07/20/15.   Hospital Course:  The patient's symptoms improved rapidly with IV fluids and insulin drip. She was transitioned to Lantus and Novolog on 07/21/15. The acute kidney injury resolved and patient's creatinine returned to baseline on 07/22/15. The chest pain resolved after 1 day and serial troponins were negative. The pain was likely GI related due to multiple episodes of vomiting, so no further work-up was performed. Patient was found to be anemic, which is chronic and likely due to chronic kidney disease though it is somewhat microcytic. Since it is chronic and there was no obvious bleeding, work-up was deferred to outpatient. Patient became hypoglycemic overnight on 07/21/15 and 07/22/15 down to 30-40s, despite being on less than half her reported home insulin. A non-compliant diet outside of the hospital may be contributing to a decreased insulin need during the admission. Scheduled Novolog was discontinued, regular diet was given, and the nighttime hypoglycemia resolved. Patient remained stable throughout the admission and was discharged home.   Procedures:  None  Consultations:  None  Discharge Exam: Filed Vitals:   07/24/15 0805  BP: 131/100  Pulse: 82  Temp: 98 F (36.7 C)  Resp: 15    General: Patient is seated comfortably in bed in no distress, she is alert and oriented with clear, fluent speech Cardiovascular: S1 S2 RRR, no m/r/g, no LE edema Respiratory: Clear to auscultation, normal respiratory effort  Discharge Instructions Continue taking 10mg  Lantus every day. Check your blood sugar  before each meal and use the sliding scale insulin to determine dose of mealtime Novolog. Begin taking 81 mg of aspirin every day. Resume home medications as normal. Continue to monitor blood glucose level and monitor diet, see diet instructions.   Follow  up with Dr. Jarold Song on 07/28/15 at [facility].   Please call PCP if hypoglycemia or nausea/vomiting/abdominal pain develops and persists.  Discharge Instructions    Call MD for:  persistant nausea and vomiting    Complete by:  As directed      Diet - low sodium heart healthy    Complete by:  As directed      Diet Carb Modified    Complete by:  As directed      Increase activity slowly    Complete by:  As directed           Current Discharge Medication List    START taking these medications   Details  aspirin EC 81 MG tablet Take 1 tablet (81 mg total) by mouth daily.      CONTINUE these medications which have CHANGED   Details  insulin aspart (NOVOLOG) 100 UNIT/ML injection Inject 15 Units into the skin 3 (three) times daily with meals. 0-15 Units, Subcutaneous, 3 times daily with meals CBG < 70: Call MD CBG 70 - 120: 0 units CBG 121 - 150: 2 units CBG 151 - 200: 3 units CBG 201 - 250: 5 units CBG 251 - 300: 8 units CBG 301 - 350: 11 units CBG 351 - 400: 15 units CBG > 400: call MD Qty: 10 mL, Refills: 0   Associated Diagnoses: Type 1 diabetes, uncontrolled, with peripheral circulatory disorder      CONTINUE these medications which have NOT CHANGED   Details  albuterol (PROVENTIL HFA;VENTOLIN HFA) 108 (90 BASE) MCG/ACT inhaler Inhale 1-2 puffs into the lungs every 6 (six) hours as needed for wheezing or shortness of breath. Qty: 3.7 g, Refills: 12    amLODipine (NORVASC) 10 MG tablet Take 0.5 tablets (5 mg total) by mouth daily. Qty: 30 tablet, Refills: 2    ferrous sulfate 325 (65 FE) MG tablet Take 1 tablet (325 mg total) by mouth 3 (three) times daily with meals. Qty: 90 tablet, Refills: 3   Associated Diagnoses: Anemia in chronic renal disease    furosemide (LASIX) 40 MG tablet Take 1 tablet (40 mg total) by mouth daily. Qty: 90 tablet, Refills: 1    gabapentin (NEURONTIN) 300 MG capsule Take 2 capsules (600 mg total) by mouth at  bedtime. Qty: 60 capsule, Refills: 11   Associated Diagnoses: Diabetic peripheral neuropathy associated with type 1 diabetes mellitus    levothyroxine (SYNTHROID, LEVOTHROID) 150 MCG tablet Take 1 tablet (150 mcg total) by mouth daily before breakfast. Qty: 30 tablet, Refills: 2   Associated Diagnoses: Other specified hypothyroidism    pantoprazole (PROTONIX) 40 MG tablet Take 1 tablet (40 mg total) by mouth daily before supper. Qty: 30 tablet, Refills: 1   Associated Diagnoses: Left flank pain    Phenylephrine-DM-GG-APAP (TYLENOL COLD MULTI-SYMPTOM) 5-10-200-325 MG TABS Take 2 tablets by mouth as needed (for cough and cold).    pravastatin (PRAVACHOL) 20 MG tablet Take 1 tablet (20 mg total) by mouth every evening. Qty: 90 tablet, Refills: 3   Associated Diagnoses: Diabetic peripheral neuropathy associated with type 1 diabetes mellitus    vitamin B-12 (CYANOCOBALAMIN) 250 MCG tablet Take 1 tablet (250 mcg total) by mouth daily. Qty: 14 tablet,  Refills: 0    Insulin Glargine (LANTUS SOLOSTAR) 100 UNIT/ML Solostar Pen Inject 10 units into the skin at bedtime Qty: 3 mL, Refills: 5    metoCLOPramide (REGLAN) 5 MG tablet Take 1 tablet (5 mg total) by mouth 4 (four) times daily -  before meals and at bedtime. Qty: 120 tablet, Refills: 2   Associated Diagnoses: Diabetic gastroparesis associated with type 1 diabetes mellitus    ondansetron (ZOFRAN) 8 MG tablet Take 1 tablet (8 mg total) by mouth every 8 (eight) hours as needed for nausea or vomiting. Qty: 20 tablet, Refills: 0   Associated Diagnoses: Left flank pain    traMADol (ULTRAM) 50 MG tablet Take 1 tablet (50 mg total) by mouth every 8 (eight) hours as needed. Qty: 30 tablet, Refills: 0   Associated Diagnoses: Bilateral flank pain      STOP taking these medications     Insulin Glargine 300 UNIT/ML SOPN        Allergies  Allergen Reactions  . Sulfamethoxazole Itching  . Trimethoprim Hives   Follow-up Information     Follow up with [place]     On 07/28/2015.   Why:  [facility] appointment on 07/28/15 at 12:00 pm with Dr. Jarold Song.   Contact information:   [address] [place] [phone]       The results of significant diagnostics from this hospitalization (including imaging, microbiology, ancillary and laboratory) are listed below for reference.    Significant Diagnostic Studies: Dg Chest 2 View  07/20/2015   CLINICAL DATA:  Vomiting, centralized chest pain since this morning  EXAM: CHEST  2 VIEW  COMPARISON:  02/13/2015  FINDINGS: The heart size and mediastinal contours are within normal limits. Both lungs are clear. The visualized skeletal structures are unremarkable.  IMPRESSION: No active cardiopulmonary disease.   Electronically Signed   By: Skipper Cliche M.D.   On: 07/20/2015 16:57    Microbiology: Recent Results (from the past 240 hour(s))  MRSA PCR Screening     Status: None   Collection Time: 07/20/15  9:00 PM  Result Value Ref Range Status   MRSA by PCR NEGATIVE NEGATIVE Final    Comment:        The GeneXpert MRSA Assay (FDA approved for NASAL specimens only), is one component of a comprehensive MRSA colonization surveillance program. It is not intended to diagnose MRSA infection nor to guide or monitor treatment for MRSA infections.   Urine culture     Status: None   Collection Time: 07/21/15  6:30 AM  Result Value Ref Range Status   Specimen Description URINE, CLEAN CATCH  Final   Special Requests NONE  Final   Culture MULTIPLE SPECIES PRESENT, SUGGEST RECOLLECTION  Final   Report Status 07/23/2015 FINAL  Final     Labs: Basic Metabolic Panel:  Recent Labs Lab 07/20/15 1650 07/20/15 2047 07/21/15 0117 07/21/15 0220 07/22/15 0646  NA 128* 130* 135 134* 138  K 4.4 4.4 3.6 3.6 4.2  CL 87* 88* 100* 98* 107  CO2 20* 20* 26 26 26   GLUCOSE 477* 464* 160* 160* 148*  BUN 43* 46* 37* 36* 19   CREATININE 4.59* 4.43* 3.59* 3.45* 2.32*  CALCIUM 9.3 9.4 8.6* 8.5* 8.6*    Recent Labs Lab 07/21/15 0220  LIPASE 16*   CBC:  Recent Labs Lab 07/20/15 1650  WBC 5.9  NEUTROABS 3.1  HGB 9.1*  HCT 28.1*  MCV 78.3  PLT 164   Cardiac Enzymes:  Recent Labs Lab 07/20/15 2047 07/21/15 0220 07/21/15 0855  TROPONINI <0.03 <0.03 <0.03    CBG:  Recent Labs Lab 07/23/15 0742 07/23/15 1156 07/23/15 1658 07/23/15 2137 07/24/15 0802  GLUCAP 248* 169* 221* 269* 258*   Signed:  Fleet Contras, PA-S Oren Binet, MD [facility] 07/24/2015, 9:53 AM  Attending MD note  Patient was seen, examined,treatment plan was discussed with PA-S.  I have personally reviewed the clinical findings, lab, imaging studies and management of this patient in detail. I agree with the documentation, as recorded PA-S.   Patient doing well-after discontinuing scheduled NovoLog and decreasing Lantus to 8 units along with SSI-hypoglycemic episodes have resolved. She is otherwise stable and is ready for discharge today. She will need continued optimization of her diabetic regimen in the outpatient setting-this has been deferred to her PCP   [facility]

## 2015-07-24 NOTE — Care Management Note (Signed)
Case Management Note  Patient Details  Name: Angela [name] MRN: [id number] Date of Birth: 1985/12/24  Subjective/Objective:    Patient is for dc today, she is already established at the [facility], she will go there to pick up medications.  NCM gave her the [facility] clinic brochure.  Patient was on [place] pta.               Action/Plan:   Expected Discharge Date:                  Expected Discharge Plan:  Home/Self Care  In-House Referral:     Discharge planning Services  CM Consult, [facility]  Post Acute Care Choice:    Choice offered to:     DME Arranged:    DME Agency:     HH Arranged:    [place] Agency:     Status of Service:  Completed, signed off  Medicare Important Message Given:    Date Medicare IM Given:    Medicare IM give by:    Date Additional Medicare IM Given:    Additional Medicare Important Message give by:     If discussed at [place] of Stay Meetings, dates discussed:    Additional Comments:  Zenon Mayo, RN 07/24/2015, 10:44 AM

## 2015-07-27 ENCOUNTER — Telehealth: Payer: Self-pay

## 2015-07-27 NOTE — Telephone Encounter (Signed)
Transitional Care Clinic Post-discharge Follow-Up Phone Call:  Date of Discharge: 07/24/2015 Principal Discharge Diagnosis(es): DKA Post-discharge Communication: spoke to the patient Call Completed: Yes                    With Whom: Patient Interpreter Needed: No              Language/Dialect:English     Please check all that apply:  X Patient is knowledgeable of his/her condition(s) and/or treatment. X Patient is caring for self at home.  ? Patient is receiving assist at home from family and/or caregiver. Family and/or caregiver is knowledgeable of patient's condition(s) and/or treatment. ? Patient is receiving home health services. If so, name of agency.     Medication Reconciliation:  ? Medication list reviewed with patient. X Patient obtained all discharge medications. If not, why?  Yes, she stated that she has all of her medications, none of them are new and  she had the medications filled  prior to her hospitalization. She noted that she also checks her blood sugar at home and has been on insulin prior to her hospitalization. Instructed her to bring all of her medications to her appointment tomorrow and she verbalized understanding.    Activities of Daily Living:  X Independent - no DME needed ? Needs assist (describe; ? home DME used) ? Total Care (describe, ? home DME used)   Community resources in place for patient:  X None  ? Home Health/Home DME ? Assisted Living ? Support Group                 Questions/Concerns discussed: Confirmed her appointment for tomorrow, 07/28/15 @ 1200  w/ Dr Jarold Song.  The patient confirmed that she has transportation to the clinic. No questions/concerns reported.

## 2015-07-28 ENCOUNTER — Ambulatory Visit: Payer: Self-pay | Attending: Family Medicine | Admitting: Family Medicine

## 2015-07-28 ENCOUNTER — Encounter: Payer: Self-pay | Admitting: Family Medicine

## 2015-07-28 VITALS — BP 131/91 | HR 92 | Temp 97.7°F | Ht 65.0 in | Wt 106.0 lb

## 2015-07-28 DIAGNOSIS — E039 Hypothyroidism, unspecified: Secondary | ICD-10-CM | POA: Insufficient documentation

## 2015-07-28 DIAGNOSIS — E785 Hyperlipidemia, unspecified: Secondary | ICD-10-CM | POA: Insufficient documentation

## 2015-07-28 DIAGNOSIS — Z9114 Patient's other noncompliance with medication regimen: Secondary | ICD-10-CM | POA: Insufficient documentation

## 2015-07-28 DIAGNOSIS — E1065 Type 1 diabetes mellitus with hyperglycemia: Secondary | ICD-10-CM | POA: Insufficient documentation

## 2015-07-28 DIAGNOSIS — Z79899 Other long term (current) drug therapy: Secondary | ICD-10-CM | POA: Insufficient documentation

## 2015-07-28 DIAGNOSIS — I129 Hypertensive chronic kidney disease with stage 1 through stage 4 chronic kidney disease, or unspecified chronic kidney disease: Secondary | ICD-10-CM | POA: Insufficient documentation

## 2015-07-28 DIAGNOSIS — Z794 Long term (current) use of insulin: Secondary | ICD-10-CM | POA: Insufficient documentation

## 2015-07-28 DIAGNOSIS — I10 Essential (primary) hypertension: Secondary | ICD-10-CM

## 2015-07-28 DIAGNOSIS — Z7982 Long term (current) use of aspirin: Secondary | ICD-10-CM | POA: Insufficient documentation

## 2015-07-28 DIAGNOSIS — Z87891 Personal history of nicotine dependence: Secondary | ICD-10-CM | POA: Insufficient documentation

## 2015-07-28 DIAGNOSIS — N183 Chronic kidney disease, stage 3 unspecified: Secondary | ICD-10-CM

## 2015-07-28 DIAGNOSIS — N1831 Chronic kidney disease, stage 3a: Secondary | ICD-10-CM | POA: Insufficient documentation

## 2015-07-28 DIAGNOSIS — F411 Generalized anxiety disorder: Secondary | ICD-10-CM | POA: Insufficient documentation

## 2015-07-28 DIAGNOSIS — N184 Chronic kidney disease, stage 4 (severe): Secondary | ICD-10-CM | POA: Insufficient documentation

## 2015-07-28 DIAGNOSIS — F329 Major depressive disorder, single episode, unspecified: Secondary | ICD-10-CM | POA: Insufficient documentation

## 2015-07-28 LAB — POCT GLYCOSYLATED HEMOGLOBIN (HGB A1C): Hemoglobin A1C: 10.7

## 2015-07-28 LAB — GLUCOSE, POCT (MANUAL RESULT ENTRY): POC GLUCOSE: 192 mg/dL — AB (ref 70–99)

## 2015-07-28 MED ORDER — BUSPIRONE HCL 7.5 MG PO TABS: 7.5000 mg | ORAL_TABLET | Freq: Two times a day (BID) | ORAL | Status: DC
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
## 2015-07-28 NOTE — Progress Notes (Signed)
Patient is an established patient of Dr. Adrian Blackwater This is her first TCC visit for DM1 Recently in hospital with DKA Patient CBG today was 142 and vitals WNL Patient states she is doing well and taking all medications

## 2015-07-28 NOTE — Patient Instructions (Signed)
Diabetes Mellitus and Food It is important for you to manage your blood sugar (glucose) level. Your blood glucose level can be greatly affected by what you eat. Eating healthier foods in the appropriate amounts throughout the day at about the same time each day will help you control your blood glucose level. It can also help slow or prevent worsening of your diabetes mellitus. Healthy eating may even help you improve the level of your blood pressure and reach or maintain a healthy weight.  HOW CAN FOOD AFFECT ME? Carbohydrates Carbohydrates affect your blood glucose level more than any other type of food. Your dietitian will help you determine how many carbohydrates to eat at each meal and teach you how to count carbohydrates. Counting carbohydrates is important to keep your blood glucose at a healthy level, especially if you are using insulin or taking certain medicines for diabetes mellitus. Alcohol Alcohol can cause sudden decreases in blood glucose (hypoglycemia), especially if you use insulin or take certain medicines for diabetes mellitus. Hypoglycemia can be a life-threatening condition. Symptoms of hypoglycemia (sleepiness, dizziness, and disorientation) are similar to symptoms of having too much alcohol.  If your health care provider has given you approval to drink alcohol, do so in moderation and use the following guidelines:  Women should not have more than one drink per day, and men should not have more than two drinks per day. One drink is equal to:  12 oz of beer.  5 oz of wine.  1 oz of hard liquor.  Do not drink on an empty stomach.  Keep yourself hydrated. Have water, diet soda, or unsweetened iced tea.  Regular soda, juice, and other mixers might contain a lot of carbohydrates and should be counted. WHAT FOODS ARE NOT RECOMMENDED? As you make food choices, it is important to remember that all foods are not the same. Some foods have fewer nutrients per serving than other  foods, even though they might have the same number of calories or carbohydrates. It is difficult to get your body what it needs when you eat foods with fewer nutrients. Examples of foods that you should avoid that are high in calories and carbohydrates but low in nutrients include:  Trans fats (most processed foods list trans fats on the Nutrition Facts label).  Regular soda.  Juice.  Candy.  Sweets, such as cake, pie, doughnuts, and cookies.  Fried foods. WHAT FOODS CAN I EAT? Have nutrient-rich foods, which will nourish your body and keep you healthy. The food you should eat also will depend on several factors, including:  The calories you need.  The medicines you take.  Your weight.  Your blood glucose level.  Your blood pressure level.  Your cholesterol level. You also should eat a variety of foods, including:  Protein, such as meat, poultry, fish, tofu, nuts, and seeds (lean animal proteins are best).  Fruits.  Vegetables.  Dairy products, such as milk, cheese, and yogurt (low fat is best).  Breads, grains, pasta, cereal, rice, and beans.  Fats such as olive oil, trans fat-free margarine, canola oil, avocado, and olives. DOES EVERYONE WITH DIABETES MELLITUS HAVE THE SAME MEAL PLAN? Because every person with diabetes mellitus is different, there is not one meal plan that works for everyone. It is very important that you meet with a dietitian who will help you create a meal plan that is just right for you. Document Released: 09/01/2005 Document Revised: 12/10/2013 Document Reviewed: 11/01/2013 Exit[employer] Patient Information 2015 [employer]. This   information is not intended to replace advice given to you by your health care provider. Make sure you discuss any questions you have with your health care provider.  

## 2015-07-31 ENCOUNTER — Telehealth: Payer: Self-pay

## 2015-07-31 NOTE — Telephone Encounter (Signed)
Call placed to patient to check on status. Patient denies any new problems and indicates she picked up Buspar and has all medications. Discussed importance of medication compliance, and patient indicates she has been taking all medications as prescribed. Patient reminded of appointment on 08/03/15 at 1500 for health coaching and appointment on 08/04/15 at 1430 with Dr. Jarold Song. Patient aware of appointments. Patient indicates she has glucometer and diabetes supplies. Encouraged patient to bring blood glucose log to appointments on 08/03/15 and 08/04/15 and to also bring medications to appointment with Dr. Jarold Song on 08/04/15. Patient verbalized understanding and appreciative of call.

## 2015-08-04 ENCOUNTER — Ambulatory Visit: Payer: Self-pay | Admitting: Family Medicine

## 2015-08-04 ENCOUNTER — Telehealth: Payer: Self-pay | Admitting: *Deleted

## 2015-08-04 NOTE — Telephone Encounter (Signed)
Patient missed her appointment at the clinic today.  Patient states she did not feel well and was running late to her appointment.  She thought her appointment was at 1500 and it was at 1430.  She arrived late and tried to reschedule and stated that the front desk told her she would have to call back on Thursday to reschedule.  Patient states that she would call to reschedule.

## 2015-08-12 ENCOUNTER — Telehealth: Payer: Self-pay

## 2015-08-12 NOTE — Telephone Encounter (Signed)
Called the patient to check on her status and to schedule an appointment at [facility].  She said that she would like to schedule an appointment and one was setup for 08/18/15 @ 1530 w/ Dr Jarold Song.  She stated that she has transportation to the appointment.  She then informed this CM that she has her period and she has been feeling a little dizzy and lightheaded and vomited this morning. She noted that she has not eaten much today and is tired. She said that her blood sugar was 204 at 1500.  She noted  that she checks her blood sugar daily and did not report any problems with her blood sugars. She stated that she has all of her medications and has been compliant with taking them as ordered and she said that she was appreciative of the appointment.  This CM instructed her to call the [facility] if she is not feeling better or to go to the ED if the dizziness and lightheadedness has not resolved.  She said that she understands but she does not need to go to the ED at this time.   She said that she is not having any shortness of breath or chest pain at this time but she has experienced some episodes of chest pain and shortness of breath in the past 2-3 days. Instructed her to call 911/seek immediate medical attention if she is experiencing chest pain or shortness of breath and she verbalized understanding but said that she does not need to go to the ER at this time.

## 2015-08-17 ENCOUNTER — Telehealth: Payer: Self-pay

## 2015-08-17 NOTE — Telephone Encounter (Signed)
Called the patient to remind her of her appointment tomorrow at [facility] @ 1500 w/ Dr Jarold Song. She said that she would be at the appointment and has transportation to the clinic.  She reported no chest pain or shortness of breath and noted that she is just tired.   She said that her blood sugars have been in the 200's which is better that they have been. She noted that they had usually been running in the 300's and 400's. No other problems/concerns were reported. Instructed her to bring all of her medications and her blood sugar log to her appointment and she stated that she would.

## 2015-08-18 ENCOUNTER — Ambulatory Visit: Payer: Self-pay | Attending: Family Medicine | Admitting: Family Medicine

## 2015-08-18 ENCOUNTER — Encounter: Payer: Self-pay | Admitting: Family Medicine

## 2015-08-18 ENCOUNTER — Encounter (HOSPITAL_BASED_OUTPATIENT_CLINIC_OR_DEPARTMENT_OTHER): Payer: Self-pay | Admitting: Clinical

## 2015-08-18 VITALS — BP 129/86 | HR 91 | Temp 98.1°F | Ht 65.0 in | Wt 140.6 lb

## 2015-08-18 DIAGNOSIS — Z7982 Long term (current) use of aspirin: Secondary | ICD-10-CM | POA: Insufficient documentation

## 2015-08-18 DIAGNOSIS — Z9114 Patient's other noncompliance with medication regimen: Secondary | ICD-10-CM | POA: Insufficient documentation

## 2015-08-18 DIAGNOSIS — E1065 Type 1 diabetes mellitus with hyperglycemia: Secondary | ICD-10-CM | POA: Insufficient documentation

## 2015-08-18 DIAGNOSIS — K2100 Gastro-esophageal reflux disease with esophagitis, without bleeding: Secondary | ICD-10-CM

## 2015-08-18 DIAGNOSIS — N183 Chronic kidney disease, stage 3 unspecified: Secondary | ICD-10-CM

## 2015-08-18 DIAGNOSIS — F329 Major depressive disorder, single episode, unspecified: Secondary | ICD-10-CM | POA: Insufficient documentation

## 2015-08-18 DIAGNOSIS — F411 Generalized anxiety disorder: Secondary | ICD-10-CM

## 2015-08-18 DIAGNOSIS — I10 Essential (primary) hypertension: Secondary | ICD-10-CM

## 2015-08-18 DIAGNOSIS — E785 Hyperlipidemia, unspecified: Secondary | ICD-10-CM | POA: Insufficient documentation

## 2015-08-18 DIAGNOSIS — I129 Hypertensive chronic kidney disease with stage 1 through stage 4 chronic kidney disease, or unspecified chronic kidney disease: Secondary | ICD-10-CM | POA: Insufficient documentation

## 2015-08-18 DIAGNOSIS — Z87891 Personal history of nicotine dependence: Secondary | ICD-10-CM | POA: Insufficient documentation

## 2015-08-18 DIAGNOSIS — E039 Hypothyroidism, unspecified: Secondary | ICD-10-CM | POA: Insufficient documentation

## 2015-08-18 DIAGNOSIS — F321 Major depressive disorder, single episode, moderate: Secondary | ICD-10-CM

## 2015-08-18 DIAGNOSIS — J45909 Unspecified asthma, uncomplicated: Secondary | ICD-10-CM | POA: Insufficient documentation

## 2015-08-18 DIAGNOSIS — K219 Gastro-esophageal reflux disease without esophagitis: Secondary | ICD-10-CM | POA: Insufficient documentation

## 2015-08-18 DIAGNOSIS — Z794 Long term (current) use of insulin: Secondary | ICD-10-CM | POA: Insufficient documentation

## 2015-08-18 LAB — GLUCOSE, POCT (MANUAL RESULT ENTRY): POC Glucose: 210 mg/dl — AB (ref 70–99)

## 2015-08-18 MED ORDER — INSULIN GLARGINE 100 UNIT/ML SOLOSTAR PEN: PEN_INJECTOR | SUBCUTANEOUS | Status: DC

## 2015-08-18 NOTE — Progress Notes (Signed)
Patient here to follow up on her type 1 DM Her CBG today was 210 and she reports taking all medications She is still feeling depressed and anxious and states she doesn't think the Buspar is helping it just makes her tired.
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
## 2015-08-18 NOTE — Patient Instructions (Signed)
Diabetes Mellitus and Food It is important for you to manage your blood sugar (glucose) level. Your blood glucose level can be greatly affected by what you eat. Eating healthier foods in the appropriate amounts throughout the day at about the same time each day will help you control your blood glucose level. It can also help slow or prevent worsening of your diabetes mellitus. Healthy eating may even help you improve the level of your blood pressure and reach or maintain a healthy weight.  HOW CAN FOOD AFFECT ME? Carbohydrates Carbohydrates affect your blood glucose level more than any other type of food. Your dietitian will help you determine how many carbohydrates to eat at each meal and teach you how to count carbohydrates. Counting carbohydrates is important to keep your blood glucose at a healthy level, especially if you are using insulin or taking certain medicines for diabetes mellitus. Alcohol Alcohol can cause sudden decreases in blood glucose (hypoglycemia), especially if you use insulin or take certain medicines for diabetes mellitus. Hypoglycemia can be a life-threatening condition. Symptoms of hypoglycemia (sleepiness, dizziness, and disorientation) are similar to symptoms of having too much alcohol.  If your health care provider has given you approval to drink alcohol, do so in moderation and use the following guidelines:  Women should not have more than one drink per day, and men should not have more than two drinks per day. One drink is equal to:  12 oz of beer.  5 oz of wine.  1 oz of hard liquor.  Do not drink on an empty stomach.  Keep yourself hydrated. Have water, diet soda, or unsweetened iced tea.  Regular soda, juice, and other mixers might contain a lot of carbohydrates and should be counted. WHAT FOODS ARE NOT RECOMMENDED? As you make food choices, it is important to remember that all foods are not the same. Some foods have fewer nutrients per serving than other  foods, even though they might have the same number of calories or carbohydrates. It is difficult to get your body what it needs when you eat foods with fewer nutrients. Examples of foods that you should avoid that are high in calories and carbohydrates but low in nutrients include:  Trans fats (most processed foods list trans fats on the Nutrition Facts label).  Regular soda.  Juice.  Candy.  Sweets, such as cake, pie, doughnuts, and cookies.  Fried foods. WHAT FOODS CAN I EAT? Have nutrient-rich foods, which will nourish your body and keep you healthy. The food you should eat also will depend on several factors, including:  The calories you need.  The medicines you take.  Your weight.  Your blood glucose level.  Your blood pressure level.  Your cholesterol level. You also should eat a variety of foods, including:  Protein, such as meat, poultry, fish, tofu, nuts, and seeds (lean animal proteins are best).  Fruits.  Vegetables.  Dairy products, such as milk, cheese, and yogurt (low fat is best).  Breads, grains, pasta, cereal, rice, and beans.  Fats such as olive oil, trans fat-free margarine, canola oil, avocado, and olives. DOES EVERYONE WITH DIABETES MELLITUS HAVE THE SAME MEAL PLAN? Because every person with diabetes mellitus is different, there is not one meal plan that works for everyone. It is very important that you meet with a dietitian who will help you create a meal plan that is just right for you. Document Released: 09/01/2005 Document Revised: 12/10/2013 Document Reviewed: 11/01/2013 Exit[employer] Patient Information 2015 [employer]. This   information is not intended to replace advice given to you by your health care provider. Make sure you discuss any questions you have with your health care provider.  

## 2015-08-18 NOTE — Progress Notes (Signed)
ASSESSMENT: Pt currently experiencing symptoms of anxiety and depression. Pt needs to f/u with PCP and [facility] and would benefit from psychoeducation and supportive counseling regarding coping with symptoms of anxiety and depression.  Stage of Change: contemplative  PLAN: 1. F/U with behavioral health consultant in as needed 2. Psychiatric Medications: Buspar. 3. Behavioral recommendation(s):   -Workout at gym daily or as needed -Consider daily relaxation exercises, as practiced in office -Consider reading educational material regarding coping with symptoms of depression and anxiety,including panic attacks SUBJECTIVE: Pt. referred by - for Dr Jarold Song concerning symptoms of anxiety and depression Pt. reports the following symptoms/concerns: Pt states that others tell her she needs to talk to someone about how easily she becomes irritated; working out at the gym helps her to feel better. Pt says that her mind races, and that she has a difficult time relaxing, and that at times she has panic attacks and feels like she cannot breathe.   Duration of problem: undetermined number of years Severity: severe  OBJECTIVE: Orientation & Cognition: Oriented x3. Thought processes normal and appropriate to situation. Mood: teary. Affect: appropriate Appearance: appropriate Risk of harm to self or others: no risk of harm to self or others Substance use: none Assessments administered: PHQ9: 22/ GAD7: 21  Diagnosis: Generalized anxiety disorder/ Major depressive disorder, single episode, moderate CPT Code: F41.1/ F32.1 -------------------------------------------- Other(s) present in the room: none  Time spent with patient in exam room: 25 minutes

## 2015-08-19 ENCOUNTER — Telehealth: Payer: Self-pay | Admitting: *Deleted

## 2015-08-19 LAB — BASIC METABOLIC PANEL
BUN: 24 mg/dL (ref 7–25)
CALCIUM: 9.6 mg/dL (ref 8.6–10.2)
CO2: 24 mmol/L (ref 20–31)
CREATININE: 2.11 mg/dL — AB (ref 0.50–1.10)
Chloride: 105 mmol/L (ref 98–110)
GLUCOSE: 75 mg/dL (ref 65–99)
Potassium: 4.6 mmol/L (ref 3.5–5.3)
SODIUM: 138 mmol/L (ref 135–146)

## 2015-08-19 NOTE — Telephone Encounter (Signed)
-----   Message from Arnoldo Morale, MD sent at 08/19/2015  8:23 AM EDT ----- Renal function shows mild imp

## 2015-08-19 NOTE — Telephone Encounter (Signed)
Left HIPAA compliant message for patient to return RN call.

## 2015-08-20 ENCOUNTER — Telehealth: Payer: Self-pay | Admitting: *Deleted

## 2015-08-20 NOTE — Telephone Encounter (Signed)
Patient called and name and date of birth verified.  Explained to patient that her renal function has improved slightly.  Patient verbalized understanding and had no further questions.

## 2015-09-01 ENCOUNTER — Telehealth: Payer: Self-pay

## 2015-09-01 NOTE — Telephone Encounter (Signed)
Attempted to contact the patient to check on her status and to schedule a follow up appt at [facility].  Call placed to # [phone] and a voice mail message was left requesting a call back to # [phone] or [phone].

## 2015-09-03 ENCOUNTER — Telehealth: Payer: Self-pay

## 2015-09-03 NOTE — Telephone Encounter (Signed)
This Case Manager placed call to patient to check on status.  Patient indicated she was doing well and being compliant with all of her medications. She indicated her blood glucose was running in the "mid-100's-low 200's". Encouraged patient to continue taking all medications as prescribed and advised patient to comply with diabetic diet. Patient indicates she has been "working hard to form better habits" with her eating. She indicates she has cut out fast food from her diet.  Encouraged patient on her progress and reiterated best foods to eat. Patient verbalized understanding.  Informed patient that Dr. Jarold Song wanted her to follow-up with Dr. Adrian Blackwater around 09/18/15. Patient verbalized understanding and wanted to schedule an appointment.  Appointment scheduled on 09/18/15 at 1100 with Dr. Adrian Blackwater. Reminded patient to bring blood glucose log to her appointment; patient verbalized understanding. No additional needs identified at this time.

## 2015-09-17 ENCOUNTER — Telehealth: Payer: Self-pay

## 2015-09-17 NOTE — Telephone Encounter (Signed)
This Case Manager placed call to patient to remind her of appointment on 09/16/15 at 1100 with Dr. Adrian Blackwater. Patient also reminded to bring in blood glucose log for Dr. Adrian Blackwater to review at her appointment.  Patient verbalized understanding.

## 2015-09-18 ENCOUNTER — Encounter: Payer: Self-pay | Admitting: Family Medicine

## 2015-09-18 ENCOUNTER — Ambulatory Visit: Payer: Self-pay | Admitting: Family Medicine

## 2015-09-18 ENCOUNTER — Ambulatory Visit: Payer: Self-pay | Attending: Family Medicine | Admitting: Family Medicine

## 2015-09-18 VITALS — BP 135/93 | HR 99 | Temp 98.1°F | Resp 18 | Ht 65.0 in | Wt 135.0 lb

## 2015-09-18 DIAGNOSIS — Z23 Encounter for immunization: Secondary | ICD-10-CM | POA: Insufficient documentation

## 2015-09-18 DIAGNOSIS — Z794 Long term (current) use of insulin: Secondary | ICD-10-CM | POA: Insufficient documentation

## 2015-09-18 DIAGNOSIS — Z87891 Personal history of nicotine dependence: Secondary | ICD-10-CM | POA: Insufficient documentation

## 2015-09-18 DIAGNOSIS — K088 Other specified disorders of teeth and supporting structures: Secondary | ICD-10-CM

## 2015-09-18 DIAGNOSIS — E1059 Type 1 diabetes mellitus with other circulatory complications: Secondary | ICD-10-CM

## 2015-09-18 DIAGNOSIS — N183 Chronic kidney disease, stage 3 unspecified: Secondary | ICD-10-CM

## 2015-09-18 DIAGNOSIS — E039 Hypothyroidism, unspecified: Secondary | ICD-10-CM

## 2015-09-18 DIAGNOSIS — IMO0002 Reserved for concepts with insufficient information to code with codable children: Secondary | ICD-10-CM

## 2015-09-18 DIAGNOSIS — E1065 Type 1 diabetes mellitus with hyperglycemia: Secondary | ICD-10-CM | POA: Insufficient documentation

## 2015-09-18 DIAGNOSIS — Z79899 Other long term (current) drug therapy: Secondary | ICD-10-CM | POA: Insufficient documentation

## 2015-09-18 DIAGNOSIS — Z0000 Encounter for general adult medical examination without abnormal findings: Secondary | ICD-10-CM

## 2015-09-18 DIAGNOSIS — Z7982 Long term (current) use of aspirin: Secondary | ICD-10-CM | POA: Insufficient documentation

## 2015-09-18 DIAGNOSIS — E1051 Type 1 diabetes mellitus with diabetic peripheral angiopathy without gangrene: Secondary | ICD-10-CM

## 2015-09-18 DIAGNOSIS — E038 Other specified hypothyroidism: Secondary | ICD-10-CM

## 2015-09-18 DIAGNOSIS — K0889 Other specified disorders of teeth and supporting structures: Secondary | ICD-10-CM | POA: Insufficient documentation

## 2015-09-18 LAB — BASIC METABOLIC PANEL
BUN: 34 mg/dL — ABNORMAL HIGH (ref 7–25)
CALCIUM: 9.7 mg/dL (ref 8.6–10.2)
CO2: 25 mmol/L (ref 20–31)
Chloride: 101 mmol/L (ref 98–110)
Creat: 2.03 mg/dL — ABNORMAL HIGH (ref 0.50–1.10)
GLUCOSE: 213 mg/dL — AB (ref 65–99)
POTASSIUM: 4.4 mmol/L (ref 3.5–5.3)
Sodium: 135 mmol/L (ref 135–146)

## 2015-09-18 LAB — GLUCOSE, POCT (MANUAL RESULT ENTRY): POC GLUCOSE: 399 mg/dL — AB (ref 70–99)

## 2015-09-18 LAB — POCT URINALYSIS DIPSTICK
Bilirubin, UA: NEGATIVE
Glucose, UA: 500
KETONES UA: NEGATIVE
Leukocytes, UA: NEGATIVE
Nitrite, UA: NEGATIVE
PH UA: 5.5
SPEC GRAV UA: 1.02
UROBILINOGEN UA: 0.2

## 2015-09-18 LAB — CBC
HCT: 28.8 % — ABNORMAL LOW (ref 36.0–46.0)
HEMOGLOBIN: 9.4 g/dL — AB (ref 12.0–15.0)
MCH: 26.5 pg (ref 26.0–34.0)
MCHC: 32.6 g/dL (ref 30.0–36.0)
MCV: 81.1 fL (ref 78.0–100.0)
PLATELETS: 203 10*3/uL (ref 150–400)
RBC: 3.55 MIL/uL — ABNORMAL LOW (ref 3.87–5.11)
RDW: 14.1 % (ref 11.5–15.5)
WBC: 3.7 10*3/uL — AB (ref 4.0–10.5)

## 2015-09-18 LAB — TSH: TSH: 15.251 u[IU]/mL — ABNORMAL HIGH (ref 0.350–4.500)

## 2015-09-18 MED ORDER — SODIUM CHLORIDE 0.9 % IV BOLUS (SEPSIS)
1000.0000 mL | Freq: Once | INTRAVENOUS | Status: AC
  Administered 2015-09-18: 1000 mL via INTRAVENOUS

## 2015-09-18 MED ORDER — INSULIN GLARGINE 300 UNIT/ML ~~LOC~~ SOPN: 30.0000 [IU] | PEN_INJECTOR | Freq: Every day | SUBCUTANEOUS | Status: DC

## 2015-09-18 MED ORDER — ACETAMINOPHEN-CODEINE #3 300-30 MG PO TABS
1.0000 | ORAL_TABLET | Freq: Three times a day (TID) | ORAL | Status: DC | PRN
Start: 2015-09-18 — End: 2015-10-26

## 2015-09-18 MED ORDER — INSULIN ASPART 100 UNIT/ML ~~LOC~~ SOLN
10.0000 [IU] | Freq: Once | SUBCUTANEOUS | Status: AC
  Administered 2015-09-18: 10 [IU] via SUBCUTANEOUS

## 2015-09-18 MED ORDER — PENICILLIN V POTASSIUM 500 MG PO TABS: 500.0000 mg | ORAL_TABLET | Freq: Three times a day (TID) | ORAL | Status: DC

## 2015-09-18 NOTE — Progress Notes (Signed)
Patient ID: Angela [name], female   DOB: [birth year], 29 y.o.   MRN: [id number]   Subjective:  Patient ID: Angela [name], female    DOB: Jul 11, 1986  Age: 29 y.o. MRN: [id number]  CC: Diabetes; Hyperglycemia; and Dental Pain   HPI Angela [name] presents for   1. CHRONIC DIABETES  Disease Monitoring  Blood Sugar Ranges: 300s   Polyuria: no   Visual problems: no   Medication Compliance: yes  15 U of novolog TID, toujeo 20 U daily  Medication Side Effects  Hypoglycemia: no   Diet: small meals, drinks sweet tea   2. CKD: has not f/u with nephrology at [facility]. Missed OV scheduled for 08/2015 due to being sick that day.   3. Dental pain: L sided upper and lower molars. Chipped tooth. No oral swelling. No fever or chills.   Social History  Substance Use Topics  . Smoking status: Former Smoker -- 0.25 packs/day for 2 years    Types: Cigarettes    Quit date: 03/04/2013  . Smokeless tobacco: Never Used  . Alcohol Use: No   Outpatient Prescriptions Prior to Visit  Medication Sig Dispense Refill  . albuterol (PROVENTIL HFA;VENTOLIN HFA) 108 (90 BASE) MCG/ACT inhaler Inhale 1-2 puffs into the lungs every 6 (six) hours as needed for wheezing or shortness of breath. 3.7 g 12  . amLODipine (NORVASC) 10 MG tablet Take 0.5 tablets (5 mg total) by mouth daily. 30 tablet 2  . aspirin EC 81 MG tablet Take 1 tablet (81 mg total) by mouth daily.    . busPIRone (BUSPAR) 7.5 MG tablet Take 1 tablet (7.5 mg total) by mouth 2 (two) times daily with a meal. 60 tablet 2  . ferrous sulfate 325 (65 FE) MG tablet Take 1 tablet (325 mg total) by mouth 3 (three) times daily with meals. 90 tablet 3  . furosemide (LASIX) 40 MG tablet Take 1 tablet (40 mg total) by mouth daily. 90 tablet 1  . gabapentin (NEURONTIN) 300 MG capsule Take 2 capsules (600 mg total) by mouth at bedtime. 60 capsule 11  . insulin aspart (NOVOLOG) 100 UNIT/ML injection Inject 15 Units into the skin 3 (three) times daily with meals. 0-15  Units, Subcutaneous, 3 times daily with meals CBG < 70: Call MD CBG 70 - 120: 0 units CBG 121 - 150: 2 units CBG 151 - 200: 3 units CBG 201 - 250: 5 units CBG 251 - 300: 8 units CBG 301 - 350: 11 units CBG 351 - 400: 15 units CBG > 400: call MD 10 mL 0  . levothyroxine (SYNTHROID, LEVOTHROID) 150 MCG tablet Take 1 tablet (150 mcg total) by mouth daily before breakfast. 30 tablet 2  . metoCLOPramide (REGLAN) 5 MG tablet Take 1 tablet (5 mg total) by mouth 4 (four) times daily -  before meals and at bedtime. 120 tablet 2  . ondansetron (ZOFRAN) 8 MG tablet Take 1 tablet (8 mg total) by mouth every 8 (eight) hours as needed for nausea or vomiting. 20 tablet 0  . pantoprazole (PROTONIX) 40 MG tablet Take 1 tablet (40 mg total) by mouth daily before supper. 30 tablet 1  . pravastatin (PRAVACHOL) 20 MG tablet Take 1 tablet (20 mg total) by mouth every evening. 90 tablet 3  . vitamin B-12 (CYANOCOBALAMIN) 250 MCG tablet Take 1 tablet (250 mcg total) by mouth daily. 14 tablet 0  . Insulin Glargine (LANTUS SOLOSTAR) 100 UNIT/ML Solostar Pen Inject 20 units into the skin at bedtime (Patient not  taking: Reported on 09/18/2015) 3 mL 5  . traMADol (ULTRAM) 50 MG tablet Take 1 tablet (50 mg total) by mouth every 8 (eight) hours as needed. (Patient not taking: Reported on 09/18/2015) 30 tablet 0   No facility-administered medications prior to visit.    ROS Review of Systems  Constitutional: Positive for fatigue. Negative for fever and chills.  Eyes: Negative for visual disturbance.  Respiratory: Negative for shortness of breath.   Cardiovascular: Negative for chest pain.  Gastrointestinal: Negative for abdominal pain and blood in stool.  Endocrine: Positive for polydipsia.  Musculoskeletal: Negative for back pain and arthralgias.  Skin: Negative for rash.  Allergic/Immunologic: Negative for immunocompromised state.  Hematological: Negative for adenopathy. Does not bruise/bleed easily.    Psychiatric/Behavioral: Negative for suicidal ideas and dysphoric mood.    Objective:  BP 135/93 mmHg  Pulse 99  Temp(Src) 98.1 F (36.7 C) (Oral)  Resp 18  Ht 5\' 5"  (1.651 m)  Wt 135 lb (61.236 kg)  BMI 22.47 kg/m2  SpO2 100%  LMP 09/09/2015  BP/Weight 09/18/2015 [id number]  Systolic BP A999333 [id number] A999333  Diastolic BP 93 86 91  Wt. (Lbs) 135 140.6 106  BMI 22.47 23.4 17.64   Physical Exam  Constitutional: She is oriented to person, place, and time. She appears well-developed and well-nourished. No distress.  HENT:  Head: Normocephalic and atraumatic.  Mouth/Throat: Abnormal dentition. Dental caries present. No dental abscesses.  Cardiovascular: Normal rate, regular rhythm, normal heart sounds and intact distal pulses.   Pulmonary/Chest: Effort normal and breath sounds normal.  Musculoskeletal: She exhibits no edema.  Neurological: She is alert and oriented to person, place, and time.  Skin: Skin is warm and dry. No rash noted.  Psychiatric: She has a normal mood and affect.   Lab Results  Component Value Date   HGBA1C 10.70 07/28/2015   CBG 399  10 U novolog 500 CC NS  Repeat CBG 227  Assessment & Plan:   Problem List Items Addressed This Visit    Hypothyroidism (Chronic)   Relevant Orders   TSH   Tooth pain   Relevant Medications   penicillin v potassium (VEETID) 500 MG tablet   acetaminophen-codeine (TYLENOL #3) 300-30 MG tablet   Other Relevant Orders   CBC   Type 1 diabetes, uncontrolled, with peripheral circulatory disorder - Primary (Chronic)   Relevant Medications   insulin aspart (novoLOG) injection 10 Units (Completed)   sodium chloride 0.9 % bolus 1,000 mL (Completed)   Insulin Glargine (TOUJEO SOLOSTAR) 300 UNIT/ML SOPN   Other Relevant Orders   Glucose (CBG) (Completed)   Urinalysis Dipstick (Completed)   Insert peripheral IV   Basic Metabolic Panel   Glucose (CBG) (Completed)    Other Visit Diagnoses    Healthcare maintenance         Relevant Orders    Flu Vaccine QUAD 36+ mos IM (Completed)       No orders of the defined types were placed in this encounter.    Follow-up: Return in about 6 weeks (around 10/30/2015).   Boykin Nearing MD

## 2015-09-18 NOTE — Patient Instructions (Addendum)
Angela [name] was seen today for follow-up.  Diagnoses and all orders for this visit:  Type 1 diabetes, uncontrolled, with peripheral circulatory disorder -     Glucose (CBG) -     Urinalysis Dipstick -     Insert peripheral IV -     insulin aspart (novoLOG) injection 10 Units; Inject 0.1 mLs (10 Units total) into the skin once. -     sodium chloride 0.9 % bolus 1,000 mL; Inject 1,000 mLs into the vein once. -     Basic Metabolic Panel -     Insulin Glargine (TOUJEO SOLOSTAR) 300 UNIT/ML SOPN; Inject 30 Units into the skin at bedtime.  Tooth pain -     penicillin v potassium (VEETID) 500 MG tablet; Take 1 tablet (500 mg total) by mouth 3 (three) times daily. -     acetaminophen-codeine (TYLENOL #3) 300-30 MG tablet; Take 1 tablet by mouth every 8 (eight) hours as needed for moderate pain. -     CBC  Hypothyroidism, unspecified hypothyroidism type -     [facility]  Nephrology  [name] December 08, 2015 at 8:25 AM.  This is the earliest available appointment, however your name has been added to the waiting list in case there is a cancellation earlier.   F/u with me in 6 weeks   Dr. Adrian Blackwater

## 2015-09-18 NOTE — Progress Notes (Signed)
Patient here for diabetes follow up.  Current blood glucose 399. Patient reports having nothing to eat or drink this morning. Last ate at midnight last night, peanut cookies and 1% milk.   Patient reports pain on left side of mouth, suspects infected tooth, at level 5, described as achy and throbbing, radiates into upper cheek bone.   Last injected Novolog 15 units yesterday at 6:30pm. Patient does not take lantus, patient takes toujeo. Last injected toujeo 20 units last night at 10:30pm. Patient refuses 20unit of novolog per protocol. Patient agrees to 10 units Novolog and IV with fluids at providers request.

## 2015-09-18 NOTE — Assessment & Plan Note (Signed)
BMP today  Nephrology f/u appt made [name] December 08, 2015 at 8:25 AM.  This is the earliest available appointment, however your name has been added to the waiting list in case there is a cancellation earlier.   F/u with me in 6 weeks

## 2015-09-21 ENCOUNTER — Telehealth: Payer: Self-pay | Admitting: Family Medicine

## 2015-09-21 MED ORDER — LEVOTHYROXINE SODIUM 175 MCG PO TABS: 175.0000 ug | ORAL_TABLET | Freq: Every day | ORAL | Status: DC

## 2015-09-21 NOTE — Addendum Note (Signed)
Addended by: Boykin Nearing on: 09/21/2015 10:27 AM   Modules accepted: Orders

## 2015-09-21 NOTE — Telephone Encounter (Signed)
Called patient and patient verified date of birth.  Informed patient that TSH improved but still elevated, increase synthroid to 175 mcg daily.  Patient aware of results and verbalized understanding.  Patient also informed that results were sent to [facility].  Patient has no further needs/questions at this time.

## 2015-10-24 ENCOUNTER — Encounter (HOSPITAL_COMMUNITY): Payer: Self-pay

## 2015-10-24 ENCOUNTER — Inpatient Hospital Stay (HOSPITAL_COMMUNITY)
Admission: EM | Admit: 2015-10-24 | Discharge: 2015-10-26 | DRG: 690 | Disposition: A | Discharge status: Home / Self Care | Payer: Self-pay | Attending: Internal Medicine | Admitting: Internal Medicine

## 2015-10-24 ENCOUNTER — Emergency Department (HOSPITAL_COMMUNITY): Payer: Self-pay

## 2015-10-24 DIAGNOSIS — N189 Chronic kidney disease, unspecified: Secondary | ICD-10-CM

## 2015-10-24 DIAGNOSIS — N3090 Cystitis, unspecified without hematuria: Secondary | ICD-10-CM | POA: Diagnosis present

## 2015-10-24 DIAGNOSIS — J45909 Unspecified asthma, uncomplicated: Secondary | ICD-10-CM | POA: Diagnosis present

## 2015-10-24 DIAGNOSIS — Z79899 Other long term (current) drug therapy: Secondary | ICD-10-CM

## 2015-10-24 DIAGNOSIS — N390 Urinary tract infection, site not specified: Secondary | ICD-10-CM | POA: Insufficient documentation

## 2015-10-24 DIAGNOSIS — N1831 Chronic kidney disease, stage 3a: Secondary | ICD-10-CM | POA: Diagnosis present

## 2015-10-24 DIAGNOSIS — F329 Major depressive disorder, single episode, unspecified: Secondary | ICD-10-CM | POA: Diagnosis present

## 2015-10-24 DIAGNOSIS — I10 Essential (primary) hypertension: Secondary | ICD-10-CM | POA: Diagnosis present

## 2015-10-24 DIAGNOSIS — E1059 Type 1 diabetes mellitus with other circulatory complications: Secondary | ICD-10-CM | POA: Diagnosis present

## 2015-10-24 DIAGNOSIS — N183 Chronic kidney disease, stage 3 (moderate): Secondary | ICD-10-CM | POA: Diagnosis present

## 2015-10-24 DIAGNOSIS — D631 Anemia in chronic kidney disease: Secondary | ICD-10-CM | POA: Diagnosis present

## 2015-10-24 DIAGNOSIS — E785 Hyperlipidemia, unspecified: Secondary | ICD-10-CM | POA: Diagnosis present

## 2015-10-24 DIAGNOSIS — Z7982 Long term (current) use of aspirin: Secondary | ICD-10-CM

## 2015-10-24 DIAGNOSIS — E538 Deficiency of other specified B group vitamins: Secondary | ICD-10-CM | POA: Diagnosis present

## 2015-10-24 DIAGNOSIS — E1043 Type 1 diabetes mellitus with diabetic autonomic (poly)neuropathy: Secondary | ICD-10-CM | POA: Diagnosis present

## 2015-10-24 DIAGNOSIS — R109 Unspecified abdominal pain: Secondary | ICD-10-CM | POA: Diagnosis present

## 2015-10-24 DIAGNOSIS — E1029 Type 1 diabetes mellitus with other diabetic kidney complication: Secondary | ICD-10-CM | POA: Diagnosis present

## 2015-10-24 DIAGNOSIS — Z87891 Personal history of nicotine dependence: Secondary | ICD-10-CM

## 2015-10-24 DIAGNOSIS — K219 Gastro-esophageal reflux disease without esophagitis: Secondary | ICD-10-CM | POA: Diagnosis present

## 2015-10-24 DIAGNOSIS — Z794 Long term (current) use of insulin: Secondary | ICD-10-CM

## 2015-10-24 DIAGNOSIS — E1022 Type 1 diabetes mellitus with diabetic chronic kidney disease: Secondary | ICD-10-CM | POA: Diagnosis present

## 2015-10-24 DIAGNOSIS — N179 Acute kidney failure, unspecified: Secondary | ICD-10-CM | POA: Diagnosis present

## 2015-10-24 DIAGNOSIS — E860 Dehydration: Secondary | ICD-10-CM | POA: Diagnosis present

## 2015-10-24 DIAGNOSIS — E034 Atrophy of thyroid (acquired): Secondary | ICD-10-CM

## 2015-10-24 DIAGNOSIS — E1159 Type 2 diabetes mellitus with other circulatory complications: Secondary | ICD-10-CM | POA: Diagnosis present

## 2015-10-24 DIAGNOSIS — K3184 Gastroparesis: Secondary | ICD-10-CM | POA: Diagnosis present

## 2015-10-24 DIAGNOSIS — E039 Hypothyroidism, unspecified: Secondary | ICD-10-CM | POA: Diagnosis present

## 2015-10-24 DIAGNOSIS — N10 Acute tubulo-interstitial nephritis: Principal | ICD-10-CM | POA: Diagnosis present

## 2015-10-24 DIAGNOSIS — E10649 Type 1 diabetes mellitus with hypoglycemia without coma: Secondary | ICD-10-CM | POA: Diagnosis present

## 2015-10-24 DIAGNOSIS — G4700 Insomnia, unspecified: Secondary | ICD-10-CM | POA: Diagnosis present

## 2015-10-24 DIAGNOSIS — D509 Iron deficiency anemia, unspecified: Secondary | ICD-10-CM | POA: Diagnosis present

## 2015-10-24 DIAGNOSIS — E875 Hyperkalemia: Secondary | ICD-10-CM | POA: Diagnosis present

## 2015-10-24 DIAGNOSIS — I129 Hypertensive chronic kidney disease with stage 1 through stage 4 chronic kidney disease, or unspecified chronic kidney disease: Secondary | ICD-10-CM | POA: Diagnosis present

## 2015-10-24 DIAGNOSIS — E038 Other specified hypothyroidism: Secondary | ICD-10-CM

## 2015-10-24 LAB — BASIC METABOLIC PANEL
ANION GAP: 16 — AB (ref 5–15)
Anion gap: 14 (ref 5–15)
BUN: 39 mg/dL — ABNORMAL HIGH (ref 6–20)
BUN: 38 mg/dL — AB (ref 6–20)
CALCIUM: 9.8 mg/dL (ref 8.9–10.3)
CHLORIDE: 101 mmol/L (ref 101–111)
CO2: 22 mmol/L (ref 22–32)
CO2: 20 mmol/L — ABNORMAL LOW (ref 22–32)
CREATININE: 3.45 mg/dL — AB (ref 0.44–1.00)
Calcium: 9 mg/dL (ref 8.9–10.3)
Chloride: 96 mmol/L — ABNORMAL LOW (ref 101–111)
Creatinine, Ser: 2.89 mg/dL — ABNORMAL HIGH (ref 0.44–1.00)
GFR calc Af Amer: 19 mL/min — ABNORMAL LOW (ref >60)
GFR calc non Af Amer: 21 mL/min — ABNORMAL LOW (ref >60)
GFR, EST AFRICAN AMERICAN: 24 mL/min — AB (ref >60)
GFR, EST NON AFRICAN AMERICAN: 17 mL/min — AB (ref >60)
GLUCOSE: 412 mg/dL — AB (ref 65–99)
Glucose, Bld: 305 mg/dL — ABNORMAL HIGH (ref 65–99)
POTASSIUM: 4.6 mmol/L (ref 3.5–5.1)
Potassium: 5.4 mmol/L — ABNORMAL HIGH (ref 3.5–5.1)
SODIUM: 137 mmol/L (ref 135–145)
Sodium: 132 mmol/L — ABNORMAL LOW (ref 135–145)

## 2015-10-24 LAB — CBC
HCT: 27.2 % — ABNORMAL LOW (ref 36.0–46.0)
HEMATOCRIT: 26 % — AB (ref 36.0–46.0)
HEMOGLOBIN: 8.2 g/dL — AB (ref 12.0–15.0)
Hemoglobin: 8.7 g/dL — ABNORMAL LOW (ref 12.0–15.0)
MCH: 26.3 pg (ref 26.0–34.0)
MCH: 26.3 pg (ref 26.0–34.0)
MCHC: 32 g/dL (ref 30.0–36.0)
MCHC: 31.5 g/dL (ref 30.0–36.0)
MCV: 82.2 fL (ref 78.0–100.0)
MCV: 83.3 fL (ref 78.0–100.0)
Platelets: 175 10*3/uL (ref 150–400)
Platelets: 158 10*3/uL (ref 150–400)
RBC: 3.31 MIL/uL — ABNORMAL LOW (ref 3.87–5.11)
RBC: 3.12 MIL/uL — AB (ref 3.87–5.11)
RDW: 12.7 % (ref 11.5–15.5)
RDW: 12.8 % (ref 11.5–15.5)
WBC: 5.2 10*3/uL (ref 4.0–10.5)
WBC: 5.7 10*3/uL (ref 4.0–10.5)

## 2015-10-24 LAB — URINALYSIS, ROUTINE W REFLEX MICROSCOPIC
Bilirubin Urine: NEGATIVE
Glucose, UA: 250 mg/dL — AB
KETONES UR: 15 mg/dL — AB
Nitrite: NEGATIVE
Specific Gravity, Urine: 1.02 (ref 1.005–1.030)
UROBILINOGEN UA: 0.2 mg/dL (ref 0.0–1.0)
pH: 5 (ref 5.0–8.0)

## 2015-10-24 LAB — PREGNANCY, URINE: PREG TEST UR: NEGATIVE

## 2015-10-24 LAB — HEPATIC FUNCTION PANEL
ALBUMIN: 4.1 g/dL (ref 3.5–5.0)
ALK PHOS: 76 U/L (ref 38–126)
ALT: 17 U/L (ref 14–54)
AST: 27 U/L (ref 15–41)
Bilirubin, Direct: <0.1 mg/dL — ABNORMAL LOW (ref 0.1–0.5)
TOTAL PROTEIN: 7.6 g/dL (ref 6.5–8.1)
Total Bilirubin: 0.8 mg/dL (ref 0.3–1.2)

## 2015-10-24 LAB — GLUCOSE, CAPILLARY
GLUCOSE-CAPILLARY: 455 mg/dL — AB (ref 65–99)
GLUCOSE-CAPILLARY: 29 mg/dL — AB (ref 65–99)
GLUCOSE-CAPILLARY: 95 mg/dL (ref 65–99)
Glucose-Capillary: 261 mg/dL — ABNORMAL HIGH (ref 65–99)
Glucose-Capillary: 260 mg/dL — ABNORMAL HIGH (ref 65–99)
Glucose-Capillary: 324 mg/dL — ABNORMAL HIGH (ref 65–99)

## 2015-10-24 LAB — LIPASE, BLOOD: Lipase: 25 U/L (ref 11–51)

## 2015-10-24 LAB — URINE MICROSCOPIC-ADD ON

## 2015-10-24 LAB — CBG MONITORING, ED
GLUCOSE-CAPILLARY: 390 mg/dL — AB (ref 65–99)
GLUCOSE-CAPILLARY: 394 mg/dL — AB (ref 65–99)

## 2015-10-24 LAB — I-STAT CHEM 8, ED
BUN: 39 mg/dL — AB (ref 6–20)
Calcium, Ion: 1.18 mmol/L (ref 1.12–1.23)
Chloride: 101 mmol/L (ref 101–111)
Creatinine, Ser: 3 mg/dL — ABNORMAL HIGH (ref 0.44–1.00)
Glucose, Bld: 405 mg/dL — ABNORMAL HIGH (ref 65–99)
HEMATOCRIT: 30 % — AB (ref 36.0–46.0)
Hemoglobin: 10.2 g/dL — ABNORMAL LOW (ref 12.0–15.0)
Potassium: 4.3 mmol/L (ref 3.5–5.1)
SODIUM: 135 mmol/L (ref 135–145)
TCO2: 18 mmol/L (ref 0–100)

## 2015-10-24 MED ORDER — HEPARIN SODIUM (PORCINE) 5000 UNIT/ML IJ SOLN
5000.0000 [IU] | Freq: Three times a day (TID) | INTRAMUSCULAR | Status: DC
  Administered 2015-10-24 – 2015-10-26 (×6): 5000 [IU] via SUBCUTANEOUS
  Filled 2015-10-24 (×5): qty 1

## 2015-10-24 MED ORDER — AMLODIPINE BESYLATE 5 MG PO TABS: 5.0000 mg | ORAL_TABLET | Freq: Every day | ORAL | Status: DC

## 2015-10-24 MED ORDER — ONDANSETRON HCL 4 MG/2ML IJ SOLN
4.0000 mg | Freq: Once | INTRAMUSCULAR | Status: AC
  Administered 2015-10-24: 4 mg via INTRAVENOUS
  Filled 2015-10-24: qty 2

## 2015-10-24 MED ORDER — METOCLOPRAMIDE HCL 5 MG/ML IJ SOLN
10.0000 mg | Freq: Three times a day (TID) | INTRAMUSCULAR | Status: DC
  Administered 2015-10-24 – 2015-10-26 (×7): 10 mg via INTRAVENOUS
  Filled 2015-10-24 (×7): qty 2

## 2015-10-24 MED ORDER — INSULIN ASPART 100 UNIT/ML ~~LOC~~ SOLN
15.0000 [IU] | Freq: Once | SUBCUTANEOUS | Status: AC
Start: 2015-10-24 — End: 2015-10-24
  Administered 2015-10-24: 15 [IU] via SUBCUTANEOUS

## 2015-10-24 MED ORDER — ONDANSETRON HCL 4 MG/2ML IJ SOLN: 4.0000 mg | Freq: Four times a day (QID) | INTRAMUSCULAR | Status: DC | PRN

## 2015-10-24 MED ORDER — ONDANSETRON HCL 4 MG PO TABS: 4.0000 mg | ORAL_TABLET | ORAL | Status: DC | PRN

## 2015-10-24 MED ORDER — SODIUM CHLORIDE 0.9 % IV BOLUS (SEPSIS)
2000.0000 mL | Freq: Once | INTRAVENOUS | Status: AC
  Administered 2015-10-24: 2000 mL via INTRAVENOUS

## 2015-10-24 MED ORDER — INSULIN GLARGINE 100 UNIT/ML ~~LOC~~ SOLN
40.0000 [IU] | Freq: Every day | SUBCUTANEOUS | Status: DC
  Filled 2015-10-24: qty 0.4

## 2015-10-24 MED ORDER — INSULIN ASPART 100 UNIT/ML ~~LOC~~ SOLN: 0.0000 [IU] | Freq: Three times a day (TID) | SUBCUTANEOUS | Status: DC

## 2015-10-24 MED ORDER — INSULIN ASPART 100 UNIT/ML ~~LOC~~ SOLN
0.0000 [IU] | Freq: Three times a day (TID) | SUBCUTANEOUS | Status: DC
  Administered 2015-10-24: 8 [IU] via SUBCUTANEOUS

## 2015-10-24 MED ORDER — PANTOPRAZOLE SODIUM 40 MG PO TBEC
40.0000 mg | DELAYED_RELEASE_TABLET | Freq: Every day | ORAL | Status: DC
  Administered 2015-10-24 – 2015-10-25 (×2): 40 mg via ORAL
  Filled 2015-10-24 (×2): qty 1

## 2015-10-24 MED ORDER — ACETAMINOPHEN 650 MG RE SUPP: 650.0000 mg | Freq: Four times a day (QID) | RECTAL | Status: DC | PRN

## 2015-10-24 MED ORDER — BUSPIRONE HCL 15 MG PO TABS
7.5000 mg | ORAL_TABLET | Freq: Two times a day (BID) | ORAL | Status: DC
Start: 2015-10-24 — End: 2015-10-26
  Administered 2015-10-24 – 2015-10-26 (×5): 7.5 mg via ORAL
  Filled 2015-10-24 (×5): qty 1

## 2015-10-24 MED ORDER — MORPHINE SULFATE (PF) 4 MG/ML IV SOLN
6.0000 mg | Freq: Once | INTRAVENOUS | Status: AC
  Administered 2015-10-24: 6 mg via INTRAVENOUS
  Filled 2015-10-24: qty 2

## 2015-10-24 MED ORDER — ACETAMINOPHEN 325 MG PO TABS: 650.0000 mg | ORAL_TABLET | Freq: Four times a day (QID) | ORAL | Status: DC | PRN

## 2015-10-24 MED ORDER — VITAMIN B-12 100 MCG PO TABS
250.0000 ug | ORAL_TABLET | Freq: Every day | ORAL | Status: DC
  Administered 2015-10-24 – 2015-10-26 (×3): 250 ug via ORAL
  Filled 2015-10-24 (×3): qty 3

## 2015-10-24 MED ORDER — FERROUS SULFATE 325 (65 FE) MG PO TABS
325.0000 mg | ORAL_TABLET | Freq: Three times a day (TID) | ORAL | Status: DC
  Administered 2015-10-24 – 2015-10-26 (×7): 325 mg via ORAL
  Filled 2015-10-24 (×7): qty 1

## 2015-10-24 MED ORDER — LEVOTHYROXINE SODIUM 25 MCG PO TABS
175.0000 ug | ORAL_TABLET | Freq: Every day | ORAL | Status: DC
  Administered 2015-10-24 – 2015-10-26 (×3): 175 ug via ORAL
  Filled 2015-10-24 (×6): qty 1

## 2015-10-24 MED ORDER — GABAPENTIN 300 MG PO CAPS
600.0000 mg | ORAL_CAPSULE | Freq: Every day | ORAL | Status: DC
  Administered 2015-10-24 – 2015-10-25 (×2): 600 mg via ORAL
  Filled 2015-10-24 (×3): qty 2

## 2015-10-24 MED ORDER — INSULIN ASPART 100 UNIT/ML ~~LOC~~ SOLN
4.0000 [IU] | Freq: Three times a day (TID) | SUBCUTANEOUS | Status: DC
  Administered 2015-10-24: 4 [IU] via SUBCUTANEOUS

## 2015-10-24 MED ORDER — ASPIRIN EC 81 MG PO TBEC
81.0000 mg | DELAYED_RELEASE_TABLET | Freq: Every day | ORAL | Status: DC
  Administered 2015-10-24 – 2015-10-26 (×3): 81 mg via ORAL
  Filled 2015-10-24 (×3): qty 1

## 2015-10-24 MED ORDER — INSULIN GLARGINE 100 UNIT/ML ~~LOC~~ SOLN
30.0000 [IU] | Freq: Every day | SUBCUTANEOUS | Status: DC
  Administered 2015-10-24 – 2015-10-25 (×2): 30 [IU] via SUBCUTANEOUS
  Filled 2015-10-24 (×3): qty 0.3

## 2015-10-24 MED ORDER — ONDANSETRON HCL 4 MG PO TABS: 4.0000 mg | ORAL_TABLET | Freq: Four times a day (QID) | ORAL | Status: DC | PRN

## 2015-10-24 MED ORDER — OXYCODONE HCL 5 MG PO TABS
5.0000 mg | ORAL_TABLET | ORAL | Status: DC | PRN
  Administered 2015-10-24: 5 mg via ORAL
  Filled 2015-10-24: qty 1

## 2015-10-24 MED ORDER — ALUM & MAG HYDROXIDE-SIMETH 200-200-20 MG/5ML PO SUSP: 30.0000 mL | Freq: Four times a day (QID) | ORAL | Status: DC | PRN

## 2015-10-24 MED ORDER — DEXTROSE 5 % IV SOLN
1.0000 g | INTRAVENOUS | Status: DC
  Administered 2015-10-25 – 2015-10-26 (×2): 1 g via INTRAVENOUS
  Filled 2015-10-24 (×2): qty 10

## 2015-10-24 MED ORDER — DEXTROSE 5 % IV SOLN
1.0000 g | Freq: Once | INTRAVENOUS | Status: AC
  Administered 2015-10-24: 1 g via INTRAVENOUS
  Filled 2015-10-24: qty 10

## 2015-10-24 MED ORDER — SODIUM CHLORIDE 0.9 % IJ SOLN
3.0000 mL | Freq: Two times a day (BID) | INTRAMUSCULAR | Status: DC
  Administered 2015-10-24 – 2015-10-25 (×3): 3 mL via INTRAVENOUS

## 2015-10-24 MED ORDER — INSULIN ASPART 100 UNIT/ML IV SOLN
8.0000 [IU] | Freq: Once | INTRAVENOUS | Status: AC
  Administered 2015-10-24: 8 [IU] via INTRAVENOUS
  Filled 2015-10-24: qty 1

## 2015-10-24 MED ORDER — SODIUM CHLORIDE 0.9 % IV SOLN
INTRAVENOUS | Status: AC
  Administered 2015-10-24 (×2): via INTRAVENOUS

## 2015-10-24 MED ORDER — INSULIN GLARGINE 100 UNIT/ML ~~LOC~~ SOLN
30.0000 [IU] | Freq: Every day | SUBCUTANEOUS | Status: DC
  Filled 2015-10-24: qty 0.3

## 2015-10-24 MED ORDER — AMLODIPINE BESYLATE 10 MG PO TABS
10.0000 mg | ORAL_TABLET | Freq: Every day | ORAL | Status: DC
  Administered 2015-10-25 – 2015-10-26 (×2): 10 mg via ORAL
  Filled 2015-10-24 (×3): qty 1

## 2015-10-24 MED ORDER — HYDROMORPHONE HCL 1 MG/ML IJ SOLN: 0.5000 mg | INTRAMUSCULAR | Status: DC | PRN

## 2015-10-24 MED ORDER — INSULIN ASPART 100 UNIT/ML ~~LOC~~ SOLN
0.0000 [IU] | Freq: Every day | SUBCUTANEOUS | Status: DC
  Administered 2015-10-24: 4 [IU] via SUBCUTANEOUS
  Administered 2015-10-25: 3 [IU] via SUBCUTANEOUS

## 2015-10-24 MED ORDER — PRAVASTATIN SODIUM 20 MG PO TABS
20.0000 mg | ORAL_TABLET | Freq: Every evening | ORAL | Status: DC
  Administered 2015-10-24 – 2015-10-25 (×2): 20 mg via ORAL
  Filled 2015-10-24 (×2): qty 1

## 2015-10-24 MED ORDER — ACETAMINOPHEN 500 MG PO TABS
1000.0000 mg | ORAL_TABLET | Freq: Once | ORAL | Status: AC
  Administered 2015-10-24: 1000 mg via ORAL
  Filled 2015-10-24: qty 2

## 2015-10-24 MED ORDER — METOCLOPRAMIDE HCL 5 MG PO TABS
5.0000 mg | ORAL_TABLET | Freq: Three times a day (TID) | ORAL | Status: DC
  Filled 2015-10-24: qty 1

## 2015-10-24 NOTE — Progress Notes (Signed)
Utilization Review Completed.Adi Doro T11/04/2015  

## 2015-10-24 NOTE — ED Notes (Signed)
Pt states that she took 10U of novolog before she got here and CGB was over 500, pt c/o pain all over and feels like she did the last time she had DKA
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
## 2015-10-24 NOTE — Progress Notes (Signed)
[facility] PROGRESS NOTE  Angela [name] [id number] DOB: [birth year] DOA: 10/24/2015 PCP: Minerva Ends, MD  Brief narrative 29 year old female with type 1 diabetes mellitus (last A1c >10 on glargine insulin and sliding scale), CK-MB stage III, chronic depression, GERD, gastritis and hyperlipidemia and anemia presented with acute onset of right flank pain associated with chills , nausea and vomiting. UA suggestive of UTI. She had acute kidney injury as well. CT of the abdomen and pelvis showed thickened bladder suggestive of infectious versus inflammatory cystitis . Patient admitted for acute cystitis/pyelonephritis.   Assessment/Plan: Acute cystitis/pyelonephritis Started on empiric Rocephin. Supportive care with IV hydration,  antiemetics and pain control. Urine culture not sent on admission which I have ordered.  Intractable nausea and vomiting Systems full liquid diet. Patient is on scheduled Reglan at home for? Gastroparesis. Switched her to IV Reglan 10 mg every 8 hours. Continue when necessary Zofran.  Acute on chronic kidney disease stage III Secondary to UTI and dehydration with nausea and vomiting. IV hydration with normal saline at 1 25 mL per hour.  Avoiding nephrotoxins   Uncontrolled type 1 diabetes mellitus with diabetic neuropathy  fsg elevated on presentation. >400 this am.  Increase glargine to 40 units at bedtime and added pre-meal aspart coverage for units 3 times a day with sliding scale coverage. Continue Neurontin. Monitor closely.  GERD Continue PPI  Hyperlipidemia Continue statin  iron deficiency anemia.  Chronic. Check iron panel.  Hypothyroidism On Synthroid.   B12 deficiency Continue supplement.  Essential hypertension Blood pressure elevated. Have increased her amlodipine dose  Major depression Stable. Continue buspirone  DVT prophylaxis: Subcutaneous heparin Diet: Full liquid   Code Status: Full code Family Communication:  None at bedside Disposition Plan: Home once clinically improved     Consultants:  None  Procedures:  None  Antibiotics:  IV Rocephin)  HPI/Subjective: Patient seen and examined. Still having nausea and vomited while I was in her room. Complains of flank pain.  Objective: Filed Vitals:   10/24/15 0932  BP: 97/52  Pulse: 100  Temp: 98 F (36.7 C)  Resp: 18    Intake/Output Summary (Last 24 hours) at 10/24/15 1114 Last data filed at 10/24/15 0932  Gross per 24 hour  Intake    770 ml  Output      0 ml  Net    770 ml   Filed Weights   10/24/15 0442  Weight: 63 kg (138 lb 14.2 oz)    Exam:   General:  Young Female appears fatigued  HEENT: Pallor present, dry oral mucosa, supple  Chest: Clear to auscultation bilaterally  CVS: Normal S1 and S2, no murmurs  GI: Soft, nondistended, flank tenderness, bowel sounds present  Musculoskeletal: Warm, no edema  CNS: Alert and oriented  Data Reviewed: Basic Metabolic Panel:  Recent Labs Lab 10/24/15 0108 10/24/15 0330 10/24/15 0502  NA 132* 135 137  K 5.4* 4.3 4.6  CL 96* 101 101  CO2 22  --  20*  GLUCOSE 412* 405* 305*  BUN 39* 39* 38*  CREATININE 3.45* 3.00* 2.89*  CALCIUM 9.8  --  9.0   Liver Function Tests:  Recent Labs Lab 10/24/15 0128  AST 27  ALT 17  ALKPHOS 76  BILITOT 0.8  PROT 7.6  ALBUMIN 4.1    Recent Labs Lab 10/24/15 0128  LIPASE 25   No results for input(s): AMMONIA in the last 168 hours. CBC:  Recent Labs Lab 10/24/15 0108 10/24/15 0330 10/24/15 0502  WBC  5.2  --  5.7  HGB 8.7* 10.2* 8.2*  HCT 27.2* 30.0* 26.0*  MCV 82.2  --  83.3  PLT 175  --  158   Cardiac Enzymes: No results for input(s): CKTOTAL, CKMB, CKMBINDEX, TROPONINI in the last 168 hours. BNP (last 3 results) No results for input(s): BNP in the last 8760 hours.  ProBNP (last 3 results) No results for input(s): PROBNP in the last 8760 hours.  CBG:  Recent Labs Lab 10/24/15 0031  10/24/15 0324 10/24/15 0440 10/24/15 0742  GLUCAP 390* 394* 261* 455*    No results found for this or any previous visit (from the past 240 hour(s)).   Studies: Ct Renal Stone Study  10/24/2015  CLINICAL DATA:  Patient with sudden onset of right flank pain, chills and decrease urination. Some nausea vomiting as well as diarrhea over the last week. Reported history of renal calculi. EXAM: CT ABDOMEN AND PELVIS WITHOUT CONTRAST TECHNIQUE: Multidetector CT imaging of the abdomen and pelvis was performed following the standard protocol without IV contrast. COMPARISON:  None. FINDINGS: Lung bases:  Clear.  Heart normal size. Liver, spleen, gallbladder, pancreas, adrenal glands:  Normal. Kidneys, ureters, bladder: No renal or ureteral stones. No renal masses. There is no hydronephrosis on either side. Ureters are normal course and caliber. Bladder shows mild wall thickening. No bladder mass or stone. Uterus and adnexa:  Unremarkable. Lymph nodes:  No adenopathy. Ascites:  None. Gastrointestinal:  Unremarkable.  Normal appendix visualized. Musculoskeletal:  Unremarkable. IMPRESSION: 1. Bladder wall appears mildly thickened, but is similar prior study. This still could reflect an infectious or inflammatory cystitis. 2. No other evidence of an acute abnormality. Exam otherwise unremarkable. No ureteral stone or obstructive uropathy. 3. Small intrarenal stones noted on the previous CT are not evident currently. Electronically Signed   By: Lajean Manes M.D.   On: 10/24/2015 02:17    Scheduled Meds: . amLODipine  10 mg Oral Daily  . aspirin EC  81 mg Oral Daily  . busPIRone  7.5 mg Oral BID WC  . [START ON 10/25/2015] cefTRIAXone (ROCEPHIN)  IV  1 g Intravenous Q24H  . ferrous sulfate  325 mg Oral TID WC  . gabapentin  600 mg Oral QHS  . heparin  5,000 Units Subcutaneous 3 times per day  . insulin aspart  0-15 Units Subcutaneous TID WC  . insulin aspart  0-5 Units Subcutaneous QHS  . insulin aspart  4  Units Subcutaneous TID WC  . insulin glargine  40 Units Subcutaneous QHS  . levothyroxine  175 mcg Oral QAC breakfast  . metoCLOPramide (REGLAN) injection  10 mg Intravenous 3 times per day  . pantoprazole  40 mg Oral QAC supper  . pravastatin  20 mg Oral QPM  . sodium chloride  3 mL Intravenous Q12H  . vitamin B-12  250 mcg Oral Daily   Continuous Infusions: . sodium chloride 125 mL/hr at 10/24/15 [id number]       Time spent: [place], [place]  [facility] Pager [phone]. If 7PM-7AM, please contact night-coverage at [email or web address], password [email or web address] 10/24/2015, 11:14 AM  LOS: 0 days

## 2015-10-24 NOTE — Progress Notes (Signed)
Hypoglycemic Event  CBG: 29  Treatment: 15 GM carbohydrate snack  Symptoms: None  Follow-up CBG: Time:1755 CBG Result:95  Possible Reasons for Event: Inadequate meal intake  Comments/MD notified: Dr. Clementeen Graham notified. Will continue to monitor    [employer]
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
## 2015-10-24 NOTE — ED Notes (Signed)
Pt reports N/V/D, bilateral flank pain and shortness of breath, onset one week ago. Emesis x 4 today. Pt has type 1 diabetes and reports this feels like DKA.

## 2015-10-24 NOTE — ED Notes (Signed)
Admitting at bedside 

## 2015-10-24 NOTE — ED Notes (Signed)
Attempted report 

## 2015-10-24 NOTE — Progress Notes (Signed)
Admission note:  Arrival Method: arrived from ED via strechter  Mental Orientation: A &O to person place , time and situation  Telemetry: Pt placed on cardiac monitor, CCMD notified Assessment: admission assessment completed see flow sheet Skin: Skin  assessment completed by this RN and Laura-Beth, D, RN IV: 22 Gauge on right hand infusing with NS Pain: complained of left flank pain, pain med given see EMAR Tubes: N/A Safety Measures: Discussed with patient, pt verbalized understanding Fall Prevention Safety Plan: non skid socks, low bed , observation, pain control Admission Screening: assessment completed 6700 Orientation: Patient has been oriented to the unit, staff and to the room.Pt resting quietly in bed , will continue to monitor.    Oren Beckmann, RN

## 2015-10-25 DIAGNOSIS — I10 Essential (primary) hypertension: Secondary | ICD-10-CM

## 2015-10-25 DIAGNOSIS — D509 Iron deficiency anemia, unspecified: Secondary | ICD-10-CM

## 2015-10-25 DIAGNOSIS — E1065 Type 1 diabetes mellitus with hyperglycemia: Secondary | ICD-10-CM

## 2015-10-25 DIAGNOSIS — N3090 Cystitis, unspecified without hematuria: Secondary | ICD-10-CM

## 2015-10-25 DIAGNOSIS — E1051 Type 1 diabetes mellitus with diabetic peripheral angiopathy without gangrene: Secondary | ICD-10-CM

## 2015-10-25 DIAGNOSIS — E10649 Type 1 diabetes mellitus with hypoglycemia without coma: Secondary | ICD-10-CM

## 2015-10-25 LAB — GLUCOSE, CAPILLARY
GLUCOSE-CAPILLARY: 35 mg/dL — AB (ref 65–99)
GLUCOSE-CAPILLARY: 276 mg/dL — AB (ref 65–99)
Glucose-Capillary: 83 mg/dL (ref 65–99)
Glucose-Capillary: 132 mg/dL — ABNORMAL HIGH (ref 65–99)
Glucose-Capillary: 204 mg/dL — ABNORMAL HIGH (ref 65–99)

## 2015-10-25 LAB — BASIC METABOLIC PANEL
Anion gap: 8 (ref 5–15)
BUN: 30 mg/dL — ABNORMAL HIGH (ref 6–20)
CHLORIDE: 106 mmol/L (ref 101–111)
CO2: 22 mmol/L (ref 22–32)
Calcium: 8.8 mg/dL — ABNORMAL LOW (ref 8.9–10.3)
Creatinine, Ser: 2.39 mg/dL — ABNORMAL HIGH (ref 0.44–1.00)
GFR calc non Af Amer: 26 mL/min — ABNORMAL LOW (ref >60)
GFR, EST AFRICAN AMERICAN: 30 mL/min — AB (ref >60)
Glucose, Bld: 110 mg/dL — ABNORMAL HIGH (ref 65–99)
POTASSIUM: 4.1 mmol/L (ref 3.5–5.1)
SODIUM: 136 mmol/L (ref 135–145)

## 2015-10-25 LAB — URINE CULTURE
Culture: 4000
SPECIAL REQUESTS: NORMAL

## 2015-10-25 NOTE — Progress Notes (Signed)
Hypoglycemic Event  CBG: 35 at 0734  Treatment: 15 GM carbohydrate snack  Symptoms: None  Follow-up CBG: Time: 0805 CBG Result:83  Possible Reasons for Event: Inadequate meal intake  Comments/MD notified: Paged MD Dhungel at [phone]. Will continue to assess and monitor the patient.   [employer], RN-BC, [employer] [facility] [address] Phone [phone]

## 2015-10-25 NOTE — Progress Notes (Signed)
[facility] PROGRESS NOTE  Trisa Byrley [id number] DOB: [birth year] DOA: 10/24/2015 PCP: Minerva Ends, MD  Brief narrative 29 year old female with type 1 diabetes mellitus (last A1c >10 on glargine insulin and sliding scale), CK-MB stage III, chronic depression, GERD, gastritis and hyperlipidemia and anemia presented with acute onset of right flank pain associated with chills , nausea and vomiting. UA suggestive of UTI. She had acute kidney injury as well. CT of the abdomen and pelvis showed thickened bladder suggestive of infectious versus inflammatory cystitis . Patient admitted for acute cystitis/pyelonephritis.   Assessment/Plan: Acute cystitis/pyelonephritis - empiric Rocephin. Supportive care with IV hydration,  antiemetics and pain control. Pain symptoms and nausea/vomiting resolved. Urine cultures pending.   Intractable nausea and vomiting Possibly due to underlying infection. Symptoms resolved with antibiotics and supportive care including scheduled Reglan. Advanced diet to diabetic  Acute on chronic kidney disease stage III Secondary to UTI and dehydration with nausea and vomiting.  Neuro invasive IV hydration. Improving.   Uncontrolled type 1 diabetes mellitus with diabetic neuropathy and episode of hypoglycemia. Patient hyperglycemic following admission and increase her bolus insulin dose and added pre-meal coverage. However became hypoglycemic during the day and insulin dose had to be adjusted. Patient again had low blood glucose this morning. Is symptomatic. We'll keep her insulin at home dose with sliding scale coverage only. (Meal coverage discontinued). Patient reports having episodes of low blood sugar during early morning . Takes her glargine at home before going to bed around midnight. I've instructed her to take her glargine around 8-9 pm and have a bedtime snack to avoid early morning hypoglycemia.   GERD Continue PPI  Hyperlipidemia Continue  statin  iron deficiency anemia.  Chronic.  Continue Supplement.  Hypothyroidism On Synthroid.   B12 deficiency Continue supplement.  Essential hypertension Blood pressure elevated. Amlodipine dose increased.  Major depression Stable. Continue buspirone  DVT prophylaxis: Subcutaneous heparin Diet: Diabetic   Code Status: Full code Family Communication: Friend at bedside Disposition Plan: Home tomorrow if improved and pending final urine culture and sensitivity.     Consultants:  None  Procedures:  None  Antibiotics:  IV Rocephin)  HPI/Subjective: Seen and examined. No further nausea and vomiting and abdominal/flank pain has resolved. Patient having episodes of low blood sugar in the 30s. Asymptomatic.  Objective: Filed Vitals:   10/25/15 1030  BP: 124/84  Pulse: 99  Temp: 98 F (36.7 C)  Resp: 20    Intake/Output Summary (Last 24 hours) at 10/25/15 1050 Last data filed at 10/25/15 1031  Gross per 24 hour  Intake    740 ml  Output   1950 ml  Net  -1210 ml   Filed Weights   10/24/15 0442 10/24/15 2007  Weight: 63 kg (138 lb 14.2 oz) 66.225 kg (146 lb)    Exam:   General:  Not in distress  HEENT: Moist mucosa, supple neck  Chest: Clear to auscultation bilaterally  CVS: Normal S1 and S2, no murmurs  GI: Soft, nondistended, nontender, bowel sounds present  Musculoskeletal: Warm, no edema    Data Reviewed: Basic Metabolic Panel:  Recent Labs Lab 10/24/15 0108 10/24/15 0330 10/24/15 0502 10/25/15 0258  NA 132* 135 137 136  K 5.4* 4.3 4.6 4.1  CL 96* 101 101 106  CO2 22  --  20* 22  GLUCOSE 412* 405* 305* 110*  BUN 39* 39* 38* 30*  CREATININE 3.45* 3.00* 2.89* 2.39*  CALCIUM 9.8  --  9.0 8.8*   Liver Function Tests:  Recent Labs Lab 10/24/15 0128  AST 27  ALT 17  ALKPHOS 76  BILITOT 0.8  PROT 7.6  ALBUMIN 4.1    Recent Labs Lab 10/24/15 0128  LIPASE 25   No results for input(s): AMMONIA in the last 168  hours. CBC:  Recent Labs Lab 10/24/15 0108 10/24/15 0330 10/24/15 0502  WBC 5.2  --  5.7  HGB 8.7* 10.2* 8.2*  HCT 27.2* 30.0* 26.0*  MCV 82.2  --  83.3  PLT 175  --  158   Cardiac Enzymes: No results for input(s): CKTOTAL, CKMB, CKMBINDEX, TROPONINI in the last 168 hours. BNP (last 3 results) No results for input(s): BNP in the last 8760 hours.  ProBNP (last 3 results) No results for input(s): PROBNP in the last 8760 hours.  CBG:  Recent Labs Lab 10/24/15 1729 10/24/15 1755 10/24/15 2006 10/25/15 0734 10/25/15 0804  GLUCAP 29* 95 324* 35* 83    No results found for this or any previous visit (from the past 240 hour(s)).   Studies: Ct Renal Stone Study  10/24/2015  CLINICAL DATA:  Patient with sudden onset of right flank pain, chills and decrease urination. Some nausea vomiting as well as diarrhea over the last week. Reported history of renal calculi. EXAM: CT ABDOMEN AND PELVIS WITHOUT CONTRAST TECHNIQUE: Multidetector CT imaging of the abdomen and pelvis was performed following the standard protocol without IV contrast. COMPARISON:  None. FINDINGS: Lung bases:  Clear.  Heart normal size. Liver, spleen, gallbladder, pancreas, adrenal glands:  Normal. Kidneys, ureters, bladder: No renal or ureteral stones. No renal masses. There is no hydronephrosis on either side. Ureters are normal course and caliber. Bladder shows mild wall thickening. No bladder mass or stone. Uterus and adnexa:  Unremarkable. Lymph nodes:  No adenopathy. Ascites:  None. Gastrointestinal:  Unremarkable.  Normal appendix visualized. Musculoskeletal:  Unremarkable. IMPRESSION: 1. Bladder wall appears mildly thickened, but is similar prior study. This still could reflect an infectious or inflammatory cystitis. 2. No other evidence of an acute abnormality. Exam otherwise unremarkable. No ureteral stone or obstructive uropathy. 3. Small intrarenal stones noted on the previous CT are not evident currently.  Electronically Signed   By: Lajean Manes M.D.   On: 10/24/2015 02:17    Scheduled Meds: . amLODipine  10 mg Oral Daily  . aspirin EC  81 mg Oral Daily  . busPIRone  7.5 mg Oral BID WC  . cefTRIAXone (ROCEPHIN)  IV  1 g Intravenous Q24H  . ferrous sulfate  325 mg Oral TID WC  . gabapentin  600 mg Oral QHS  . heparin  5,000 Units Subcutaneous 3 times per day  . insulin aspart  0-15 Units Subcutaneous TID WC  . insulin aspart  0-5 Units Subcutaneous QHS  . insulin glargine  30 Units Subcutaneous QHS  . levothyroxine  175 mcg Oral QAC breakfast  . metoCLOPramide (REGLAN) injection  10 mg Intravenous 3 times per day  . pantoprazole  40 mg Oral QAC supper  . pravastatin  20 mg Oral QPM  . sodium chloride  3 mL Intravenous Q12H  . vitamin B-12  250 mcg Oral Daily   Continuous Infusions:       Time spent: [place], [place]  [facility] Pager [phone]. If 7PM-7AM, please contact night-coverage at [email or web address], password [email or web address] 10/25/2015, 10:50 AM  LOS: 1 day

## 2015-10-26 DIAGNOSIS — N10 Acute tubulo-interstitial nephritis: Principal | ICD-10-CM

## 2015-10-26 DIAGNOSIS — N179 Acute kidney failure, unspecified: Secondary | ICD-10-CM | POA: Diagnosis present

## 2015-10-26 DIAGNOSIS — R109 Unspecified abdominal pain: Secondary | ICD-10-CM

## 2015-10-26 DIAGNOSIS — D631 Anemia in chronic kidney disease: Secondary | ICD-10-CM

## 2015-10-26 DIAGNOSIS — N390 Urinary tract infection, site not specified: Secondary | ICD-10-CM | POA: Insufficient documentation

## 2015-10-26 DIAGNOSIS — N183 Chronic kidney disease, stage 3 unspecified: Secondary | ICD-10-CM | POA: Diagnosis present

## 2015-10-26 DIAGNOSIS — E875 Hyperkalemia: Secondary | ICD-10-CM

## 2015-10-26 DIAGNOSIS — N189 Chronic kidney disease, unspecified: Secondary | ICD-10-CM

## 2015-10-26 LAB — BASIC METABOLIC PANEL
ANION GAP: 9 (ref 5–15)
BUN: 18 mg/dL (ref 6–20)
CALCIUM: 9.1 mg/dL (ref 8.9–10.3)
CHLORIDE: 104 mmol/L (ref 101–111)
CO2: 25 mmol/L (ref 22–32)
Creatinine, Ser: 1.82 mg/dL — ABNORMAL HIGH (ref 0.44–1.00)
GFR calc non Af Amer: 36 mL/min — ABNORMAL LOW (ref >60)
GFR, EST AFRICAN AMERICAN: 42 mL/min — AB (ref >60)
Glucose, Bld: 45 mg/dL — ABNORMAL LOW (ref 65–99)
Potassium: 3.6 mmol/L (ref 3.5–5.1)
SODIUM: 138 mmol/L (ref 135–145)

## 2015-10-26 LAB — GLUCOSE, CAPILLARY
GLUCOSE-CAPILLARY: 92 mg/dL (ref 65–99)
Glucose-Capillary: 52 mg/dL — ABNORMAL LOW (ref 65–99)

## 2015-10-26 MED ORDER — CIPROFLOXACIN HCL 250 MG PO TABS: 250.0000 mg | ORAL_TABLET | Freq: Two times a day (BID) | ORAL | Status: AC

## 2015-10-26 MED ORDER — AMLODIPINE BESYLATE 10 MG PO TABS: 10.0000 mg | ORAL_TABLET | Freq: Every day | ORAL | Status: DC

## 2015-10-26 NOTE — Progress Notes (Signed)
Hypoglycemic Event  CBG: 52  Treatment: 15 GM carbohydrate snack  Symptoms: None  Follow-up CBG: Time:0800 CBG Result:92  Possible Reasons for Event: Unknown  Comments/MD notified: Dr. Clementeen Graham notified.    Princella Pellegrini

## 2015-10-26 NOTE — Progress Notes (Signed)
10/26/2015 10:28 AM  Reviewed discharge instructions with patient, including medications, prescriptions, education, follow-up instructions,etc.  Patient asked questions appropriately and verbalized understanding.  IV and telemetry removed.  Pt gathered belongings and preferred to walk with family downstairs to drive herself home.   Angela [name]

## 2015-10-26 NOTE — Discharge Instructions (Signed)
Hypoglycemia Low blood sugar (hypoglycemia) means that the level of sugar in your blood is lower than it should be. Signs of low blood sugar include:  Getting sweaty.  Feeling hungry.  Feeling dizzy or weak.  Feeling sleepier than normal.  Feeling nervous.  Headaches.  Having a fast heartbeat. Low blood sugar can happen fast and can be an emergency. Your doctor can do tests to check your blood sugar level. You can have low blood sugar and not have diabetes. HOME CARE  Check your blood sugar as told by your doctor. If it is less than 70 mg/dl or as told by your doctor, take 1 of the following:  3 to 4 glucose tablets.   cup clear juice.   cup soda pop, not diet.  1 cup milk.  5 to 6 hard candies.  Recheck blood sugar after 15 minutes. Repeat until it is at the right level.  Eat a snack if it is more than 1 hour until the next meal.  Only take medicine as told by your doctor.  Do not skip meals. Eat on time.  Do not drink alcohol except with meals.  Check your blood glucose before driving.  Check your blood glucose before and after exercise.  Always carry treatment with you, such as glucose pills.  Always wear a medical alert bracelet if you have diabetes. GET HELP RIGHT AWAY IF:   Your blood glucose goes below 70 mg/dl or as told by your doctor, and you:  Are confused.  Are not able to swallow.  Pass out (faint).  You cannot treat yourself. You may need someone to help you.  You have low blood sugar problems often.  You have problems from your medicines.  You are not feeling better after 3 to 4 days.  You have vision changes. MAKE SURE YOU:   Understand these instructions.  Will watch this condition.  Will get help right away if you are not doing well or get worse.   This information is not intended to replace advice given to you by your health care provider. Make sure you discuss any questions you have with your health care provider.     Document Released: 03/01/2010 Document Revised: 12/26/2014 Document Reviewed: 08/11/2015 Elsevier Interactive Patient Education 2016 [employer]  

## 2015-10-26 NOTE — Discharge Summary (Signed)
Physician Discharge Summary  Angela [name] [id number] DOB: May 03, 1986 DOA: 10/24/2015  PCP: Minerva Ends, MD  Admit date: 10/24/2015 Discharge date: 10/26/2015  Time spent: 35 minutes  Recommendations for Outpatient Follow-up:  1. Discharge home with outpatient PCP follow-up in 1 week 2. Patient complete a ten-day course of antibiotics (ciprofloxacin) on 11/14.  Discharge Diagnoses:  Principal Problem:   Acute pyelonephritis   Active Problems:   Hypothyroidism   Type 1 diabetes, uncontrolled, with peripheral circulatory disorder (HCC)   HTN (hypertension)   Anemia in chronic renal disease   CKD (chronic kidney disease), stage III   Cystitis   Hyperkalemia   Flank pain   Acute renal failure superimposed on stage 3 chronic kidney disease ([place])   Discharge Condition: Fair  Diet recommendation: Diabetic with bedtime snack to prevent morning hypoglycemia.  Filed Weights   10/24/15 0442 10/24/15 2007 10/25/15 2100  Weight: 63 kg (138 lb 14.2 oz) 66.225 kg (146 lb) 66.361 kg (146 lb 4.8 oz)    History of present illness:  Reason for to admission H&P for details, in brief,29 year old female with type 1 diabetes mellitus (last A1c >10 on glargine insulin and sliding scale), CK-MB stage III, chronic depression, GERD, gastritis and hyperlipidemia and anemia presented with acute onset of right flank pain associated with chills , nausea and vomiting. UA suggestive of UTI. She had acute kidney injury as well. CT of the abdomen and pelvis showed thickened bladder suggestive of infectious versus inflammatory cystitis . Patient admitted for acute cystitis/pyelonephritis.  Hospital Course:  Acute cystitis/pyelonephritis - Given empiric Rocephin. Supportive care with IV hydration, antiemetics and pain control. Symptoms have resolved. Urine culture with insignificant growth. Patient's prior urine culture growing Escherichia coli sensitive to quinolones. I will discharge her on oral  ciprofloxacin to complete a total 10 day course of antibiotics.  Intractable nausea and vomiting Possibly due to underlying infection. Symptoms resolved with antibiotics and supportive care including scheduled Reglan. Tolerating advanced diet.  Acute on chronic kidney disease stage III Secondary to UTI and dehydration with nausea and vomiting.  Neuro invasive IV hydration. Improving.   Uncontrolled type 1 diabetes mellitus with diabetic neuropathy and episodes of morning hypoglycemia. Patient hyperglycemic following admission and increase her bolus insulin dose and added pre-meal coverage. However became hypoglycemic during the day and insulin dose had to be adjusted. She now again having low blood sugar in the mornings. Was placed back on her home insulin regimen. Patient reports having episodes of low blood sugar during early morning . She  takes her her Toujeo  at home before going to bed around midnight. I've instructed her to take her insulin around 8-9 pm and have a bedtime snack to avoid early morning hypoglycemia.   GERD Continue PPI  Hyperlipidemia Continue statin  iron deficiency anemia.  Chronic.  Continue Supplement.  Hypothyroidism On Synthroid.  B12 deficiency Continue supplement.  Essential hypertension Blood pressure elevated. Amlodipine dose increased to 10 mg daily.  Major depression Stable. Continue buspirone   She is stable to be discharged home with outpatient follow-up with PCP.      Code Status: Full code Family Communication: None at bedside Disposition Plan: Home      Consultants:  None  Procedures:  None  Antibiotics:  IV Rocephin)   Discharge Exam: Filed Vitals:   10/26/15 0500  BP: 110/76  Pulse: 89  Temp: 98.7 F (37.1 C)  Resp: 18     General: Young female Not in distress  HEENT: Moist  mucosa, supple neck  Chest: Clear to auscultation bilaterally  CVS: Normal S1 and S2, no murmurs  GI: Soft,  nondistended, nontender, bowel sounds present  Musculoskeletal: Warm, no edema  CNS: Alert and oriented  Discharge Instructions    Current Discharge Medication List    START taking these medications   Details  ciprofloxacin (CIPRO) 250 MG tablet Take 1 tablet (250 mg total) by mouth 2 (two) times daily. Qty: 14 tablet, Refills: 0      CONTINUE these medications which have CHANGED   Details  amLODipine (NORVASC) 10 MG tablet Take 1 tablet (10 mg total) by mouth daily. Qty: 30 tablet, Refills: 2      CONTINUE these medications which have NOT CHANGED   Details  albuterol (PROVENTIL HFA;VENTOLIN HFA) 108 (90 BASE) MCG/ACT inhaler Inhale 1-2 puffs into the lungs every 6 (six) hours as needed for wheezing or shortness of breath. Qty: 3.7 g, Refills: 12    aspirin EC 81 MG tablet Take 1 tablet (81 mg total) by mouth daily.    busPIRone (BUSPAR) 7.5 MG tablet Take 1 tablet (7.5 mg total) by mouth 2 (two) times daily with a meal. Qty: 60 tablet, Refills: 2    ferrous sulfate 325 (65 FE) MG tablet Take 1 tablet (325 mg total) by mouth 3 (three) times daily with meals. Qty: 90 tablet, Refills: 3   Associated Diagnoses: Anemia in chronic renal disease    furosemide (LASIX) 40 MG tablet Take 1 tablet (40 mg total) by mouth daily. Qty: 90 tablet, Refills: 1    gabapentin (NEURONTIN) 300 MG capsule Take 2 capsules (600 mg total) by mouth at bedtime. 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 0-15 Units, Subcutaneous, 3 times daily with meals CBG < 70: Call MD CBG 70 - 120: 0 units CBG 121 - 150: 2 units CBG 151 - 200: 3 units CBG 201 - 250: 5 units CBG 251 - 300: 8 units CBG 301 - 350: 11 units CBG 351 - 400: 15 units CBG > 400: call MD Qty: 10 mL, Refills: 0   Associated  Diagnoses: Type 1 diabetes, uncontrolled, with peripheral circulatory disorder (HCC)    Insulin Glargine (TOUJEO SOLOSTAR) 300 UNIT/ML SOPN Inject 30 Units into the skin at bedtime. Qty: 1.5 mL, Refills: 3   Associated Diagnoses: Type 1 diabetes, uncontrolled, with peripheral circulatory disorder (HCC)    levothyroxine (SYNTHROID, LEVOTHROID) 175 MCG tablet Take 1 tablet (175 mcg total) by mouth daily before breakfast. Qty: 30 tablet, Refills: 2   Associated Diagnoses: Other specified hypothyroidism    metoCLOPramide (REGLAN) 5 MG tablet Take 1 tablet (5 mg total) by mouth 4 (four) times daily -  before meals and at bedtime. Qty: 120 tablet, Refills: 2   Associated Diagnoses: Diabetic gastroparesis associated with type 1 diabetes mellitus (HCC)    ondansetron (ZOFRAN) 8 MG tablet Take 1 tablet (8 mg total) by mouth every 8 (eight) hours as needed for nausea or vomiting. Qty: 20 tablet, Refills: 0   Associated Diagnoses: Left flank pain    pantoprazole (PROTONIX) 40 MG tablet Take 1 tablet (40 mg total) by mouth daily before supper. Qty: 30 tablet, Refills: 1   Associated Diagnoses: Left flank pain    pravastatin (PRAVACHOL) 20 MG tablet Take 1 tablet (20 mg total) by  mouth every evening. Qty: 90 tablet, Refills: 3   Associated Diagnoses: Diabetic peripheral neuropathy associated with type 1 diabetes mellitus (HCC)    vitamin B-12 (CYANOCOBALAMIN) 250 MCG tablet Take 1 tablet (250 mcg total) by mouth daily. Qty: 14 tablet, Refills: 0      STOP taking these medications     acetaminophen-codeine (TYLENOL #3) 300-30 MG tablet      penicillin v potassium (VEETID) 500 MG tablet        Allergies  Allergen Reactions  . Sulfamethoxazole Itching  . Trimethoprim Hives   Follow-up Information    Follow up with Minerva Ends, MD. Schedule an appointment as soon as possible for a visit in 1 week.   Specialty:  Family Medicine   Contact information:   [place] [id number] [phone]        The results of significant diagnostics from this hospitalization (including imaging, microbiology, ancillary and laboratory) are listed below for reference.    Significant Diagnostic Studies: Ct Renal Stone Study  10/24/2015  CLINICAL DATA:  Patient with sudden onset of right flank pain, chills and decrease urination. Some nausea vomiting as well as diarrhea over the last week. Reported history of renal calculi. EXAM: CT ABDOMEN AND PELVIS WITHOUT CONTRAST TECHNIQUE: Multidetector CT imaging of the abdomen and pelvis was performed following the standard protocol without IV contrast. COMPARISON:  None. FINDINGS: Lung bases:  Clear.  Heart normal size. Liver, spleen, gallbladder, pancreas, adrenal glands:  Normal. Kidneys, ureters, bladder: No renal or ureteral stones. No renal masses. There is no hydronephrosis on either side. Ureters are normal course and caliber. Bladder shows mild wall thickening. No bladder mass or stone. Uterus and adnexa:  Unremarkable. Lymph nodes:  No adenopathy. Ascites:  None. Gastrointestinal:  Unremarkable.  Normal appendix visualized. Musculoskeletal:  Unremarkable. IMPRESSION: 1. Bladder wall appears mildly thickened, but is similar prior study. This still could reflect an infectious or inflammatory cystitis. 2. No other evidence of an acute abnormality. Exam otherwise unremarkable. No ureteral stone or obstructive uropathy. 3. Small intrarenal stones noted on the previous CT are not evident currently. Electronically Signed   By: Lajean Manes M.D.   On: 10/24/2015 02:17    Microbiology: Recent Results (from the past 240 hour(s))  Urine culture     Status: None   Collection Time: 10/24/15  1:29 PM  Result Value Ref Range Status   Specimen Description URINE, CLEAN CATCH  Final   Special Requests Normal  Final   Culture 4,000 COLONIES/mL INSIGNIFICANT GROWTH  Final   Report Status 10/25/2015 FINAL  Final      Labs: Basic Metabolic Panel:  Recent Labs Lab 10/24/15 0108 10/24/15 0330 10/24/15 0502 10/25/15 0258 10/26/15 0420  NA 132* 135 137 136 138  K 5.4* 4.3 4.6 4.1 3.6  CL 96* 101 101 106 104  CO2 22  --  20* 22 25  GLUCOSE 412* 405* 305* 110* 45*  BUN 39* 39* 38* 30* 18  CREATININE 3.45* 3.00* 2.89* 2.39* 1.82*  CALCIUM 9.8  --  9.0 8.8* 9.1   Liver Function Tests:  Recent Labs Lab 10/24/15 0128  AST 27  ALT 17  ALKPHOS 76  BILITOT 0.8  PROT 7.6  ALBUMIN 4.1    Recent Labs Lab 10/24/15 0128  LIPASE 25   No results for input(s): AMMONIA in the last 168 hours. CBC:  Recent Labs Lab 10/24/15 0108 10/24/15 0330 10/24/15 0502  WBC 5.2  --  5.7  HGB 8.7* 10.2* 8.2*  HCT 27.2* 30.0* 26.0*  MCV 82.2  --  83.3  PLT 175  --  158   Cardiac Enzymes: No results for input(s): CKTOTAL, CKMB, CKMBINDEX, TROPONINI in the last 168 hours. BNP: BNP (last 3 results) No results for input(s): BNP in the last 8760 hours.  ProBNP (last 3 results) No results for input(s): PROBNP in the last 8760 hours.  CBG:  Recent Labs Lab 10/25/15 1207 10/25/15 1628 10/25/15 2130 10/26/15 0743 10/26/15 0809  GLUCAP 132* 204* 276* 52* 92       Signed:  Jaymere Alen  [facility] 10/26/2015, 9:29 AM

## 2015-12-09 ENCOUNTER — Inpatient Hospital Stay (HOSPITAL_COMMUNITY)
Admission: EM | Admit: 2015-12-09 | Discharge: 2015-12-11 | DRG: 638 | Disposition: A | Discharge status: Home / Self Care | Payer: Self-pay | Attending: Family Medicine | Admitting: Family Medicine

## 2015-12-09 ENCOUNTER — Encounter (HOSPITAL_COMMUNITY): Payer: Self-pay | Admitting: Emergency Medicine

## 2015-12-09 DIAGNOSIS — E1022 Type 1 diabetes mellitus with diabetic chronic kidney disease: Secondary | ICD-10-CM | POA: Diagnosis present

## 2015-12-09 DIAGNOSIS — K219 Gastro-esophageal reflux disease without esophagitis: Secondary | ICD-10-CM | POA: Diagnosis present

## 2015-12-09 DIAGNOSIS — I129 Hypertensive chronic kidney disease with stage 1 through stage 4 chronic kidney disease, or unspecified chronic kidney disease: Secondary | ICD-10-CM | POA: Diagnosis present

## 2015-12-09 DIAGNOSIS — F329 Major depressive disorder, single episode, unspecified: Secondary | ICD-10-CM | POA: Diagnosis present

## 2015-12-09 DIAGNOSIS — E1010 Type 1 diabetes mellitus with ketoacidosis without coma: Principal | ICD-10-CM | POA: Diagnosis present

## 2015-12-09 DIAGNOSIS — Z794 Long term (current) use of insulin: Secondary | ICD-10-CM

## 2015-12-09 DIAGNOSIS — Z9114 Patient's other noncompliance with medication regimen: Secondary | ICD-10-CM

## 2015-12-09 DIAGNOSIS — E10649 Type 1 diabetes mellitus with hypoglycemia without coma: Secondary | ICD-10-CM | POA: Diagnosis present

## 2015-12-09 DIAGNOSIS — E1029 Type 1 diabetes mellitus with other diabetic kidney complication: Secondary | ICD-10-CM

## 2015-12-09 DIAGNOSIS — F411 Generalized anxiety disorder: Secondary | ICD-10-CM | POA: Diagnosis present

## 2015-12-09 DIAGNOSIS — N183 Chronic kidney disease, stage 3 (moderate): Secondary | ICD-10-CM

## 2015-12-09 DIAGNOSIS — E785 Hyperlipidemia, unspecified: Secondary | ICD-10-CM | POA: Diagnosis present

## 2015-12-09 DIAGNOSIS — N184 Chronic kidney disease, stage 4 (severe): Secondary | ICD-10-CM | POA: Diagnosis present

## 2015-12-09 DIAGNOSIS — E10319 Type 1 diabetes mellitus with unspecified diabetic retinopathy without macular edema: Secondary | ICD-10-CM | POA: Diagnosis present

## 2015-12-09 DIAGNOSIS — Z7982 Long term (current) use of aspirin: Secondary | ICD-10-CM

## 2015-12-09 DIAGNOSIS — E039 Hypothyroidism, unspecified: Secondary | ICD-10-CM | POA: Diagnosis present

## 2015-12-09 DIAGNOSIS — Z833 Family history of diabetes mellitus: Secondary | ICD-10-CM

## 2015-12-09 DIAGNOSIS — E1065 Type 1 diabetes mellitus with hyperglycemia: Secondary | ICD-10-CM | POA: Diagnosis present

## 2015-12-09 DIAGNOSIS — R200 Anesthesia of skin: Secondary | ICD-10-CM

## 2015-12-09 DIAGNOSIS — R739 Hyperglycemia, unspecified: Secondary | ICD-10-CM | POA: Diagnosis present

## 2015-12-09 DIAGNOSIS — D631 Anemia in chronic kidney disease: Secondary | ICD-10-CM | POA: Diagnosis present

## 2015-12-09 DIAGNOSIS — E860 Dehydration: Secondary | ICD-10-CM

## 2015-12-09 DIAGNOSIS — G4700 Insomnia, unspecified: Secondary | ICD-10-CM | POA: Diagnosis present

## 2015-12-09 DIAGNOSIS — Z79899 Other long term (current) drug therapy: Secondary | ICD-10-CM

## 2015-12-09 DIAGNOSIS — J45909 Unspecified asthma, uncomplicated: Secondary | ICD-10-CM | POA: Diagnosis present

## 2015-12-09 DIAGNOSIS — N179 Acute kidney failure, unspecified: Secondary | ICD-10-CM | POA: Diagnosis present

## 2015-12-09 DIAGNOSIS — N1831 Chronic kidney disease, stage 3a: Secondary | ICD-10-CM | POA: Diagnosis present

## 2015-12-09 DIAGNOSIS — D519 Vitamin B12 deficiency anemia, unspecified: Secondary | ICD-10-CM

## 2015-12-09 DIAGNOSIS — N289 Disorder of kidney and ureter, unspecified: Secondary | ICD-10-CM

## 2015-12-09 DIAGNOSIS — D696 Thrombocytopenia, unspecified: Secondary | ICD-10-CM | POA: Diagnosis present

## 2015-12-09 DIAGNOSIS — N189 Chronic kidney disease, unspecified: Secondary | ICD-10-CM

## 2015-12-09 DIAGNOSIS — E870 Hyperosmolality and hypernatremia: Secondary | ICD-10-CM | POA: Diagnosis present

## 2015-12-09 DIAGNOSIS — Z823 Family history of stroke: Secondary | ICD-10-CM

## 2015-12-09 DIAGNOSIS — E1040 Type 1 diabetes mellitus with diabetic neuropathy, unspecified: Secondary | ICD-10-CM | POA: Diagnosis present

## 2015-12-09 LAB — CBC
HEMATOCRIT: 25.4 % — AB (ref 36.0–46.0)
Hemoglobin: 7.8 g/dL — ABNORMAL LOW (ref 12.0–15.0)
MCH: 27.6 pg (ref 26.0–34.0)
MCHC: 30.7 g/dL (ref 30.0–36.0)
MCV: 89.8 fL (ref 78.0–100.0)
PLATELETS: 143 10*3/uL — AB (ref 150–400)
RBC: 2.83 MIL/uL — ABNORMAL LOW (ref 3.87–5.11)
RDW: 13.4 % (ref 11.5–15.5)
WBC: 4.8 10*3/uL (ref 4.0–10.5)

## 2015-12-09 LAB — CBG MONITORING, ED

## 2015-12-09 NOTE — ED Notes (Signed)
Pt states she has been unable to control her blood sugar with insulin for over 2 weeks and has been experiencing episodes of emesis for about 2 weeks. Pt has not seen her PCP for this. Pt has taken zofran at home. Pt has thrown up 2 times today. Pt states she has been experiencing worsening neuropathy at home as well.
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
## 2015-12-10 ENCOUNTER — Encounter (HOSPITAL_COMMUNITY): Payer: Self-pay | Admitting: Emergency Medicine

## 2015-12-10 ENCOUNTER — Inpatient Hospital Stay (HOSPITAL_COMMUNITY): Payer: Self-pay

## 2015-12-10 DIAGNOSIS — E1029 Type 1 diabetes mellitus with other diabetic kidney complication: Secondary | ICD-10-CM

## 2015-12-10 DIAGNOSIS — E038 Other specified hypothyroidism: Secondary | ICD-10-CM

## 2015-12-10 DIAGNOSIS — E1065 Type 1 diabetes mellitus with hyperglycemia: Secondary | ICD-10-CM | POA: Diagnosis present

## 2015-12-10 DIAGNOSIS — N183 Chronic kidney disease, stage 3 (moderate): Secondary | ICD-10-CM

## 2015-12-10 DIAGNOSIS — N189 Chronic kidney disease, unspecified: Secondary | ICD-10-CM

## 2015-12-10 DIAGNOSIS — R739 Hyperglycemia, unspecified: Secondary | ICD-10-CM | POA: Diagnosis present

## 2015-12-10 DIAGNOSIS — E034 Atrophy of thyroid (acquired): Secondary | ICD-10-CM

## 2015-12-10 DIAGNOSIS — D631 Anemia in chronic kidney disease: Secondary | ICD-10-CM

## 2015-12-10 DIAGNOSIS — F411 Generalized anxiety disorder: Secondary | ICD-10-CM

## 2015-12-10 LAB — BASIC METABOLIC PANEL
ANION GAP: 12 (ref 5–15)
ANION GAP: 9 (ref 5–15)
BUN: 45 mg/dL — ABNORMAL HIGH (ref 6–20)
BUN: 52 mg/dL — AB (ref 6–20)
CALCIUM: 8.5 mg/dL — AB (ref 8.9–10.3)
CHLORIDE: 101 mmol/L (ref 101–111)
CO2: 22 mmol/L (ref 22–32)
CO2: 25 mmol/L (ref 22–32)
Calcium: 8.6 mg/dL — ABNORMAL LOW (ref 8.9–10.3)
Chloride: 90 mmol/L — ABNORMAL LOW (ref 101–111)
Creatinine, Ser: 2.12 mg/dL — ABNORMAL HIGH (ref 0.44–1.00)
Creatinine, Ser: 2.62 mg/dL — ABNORMAL HIGH (ref 0.44–1.00)
GFR calc Af Amer: 27 mL/min — ABNORMAL LOW (ref >60)
GFR calc non Af Amer: 30 mL/min — ABNORMAL LOW (ref >60)
GFR, EST AFRICAN AMERICAN: 35 mL/min — AB (ref >60)
GFR, EST NON AFRICAN AMERICAN: 23 mL/min — AB (ref >60)
GLUCOSE: 885 mg/dL — AB (ref 65–99)
Glucose, Bld: 183 mg/dL — ABNORMAL HIGH (ref 65–99)
Potassium: 4 mmol/L (ref 3.5–5.1)
Potassium: 5.3 mmol/L — ABNORMAL HIGH (ref 3.5–5.1)
SODIUM: 135 mmol/L (ref 135–145)
Sodium: 124 mmol/L — ABNORMAL LOW (ref 135–145)

## 2015-12-10 LAB — URINE MICROSCOPIC-ADD ON
BACTERIA UA: NONE SEEN
Bacteria, UA: NONE SEEN
Squamous Epithelial / LPF: NONE SEEN
WBC UA: NONE SEEN WBC/hpf (ref 0–5)

## 2015-12-10 LAB — CBC
HCT: 22.7 % — ABNORMAL LOW (ref 36.0–46.0)
HEMOGLOBIN: 7.4 g/dL — AB (ref 12.0–15.0)
MCH: 27.5 pg (ref 26.0–34.0)
MCHC: 32.6 g/dL (ref 30.0–36.0)
MCV: 84.4 fL (ref 78.0–100.0)
PLATELETS: 129 10*3/uL — AB (ref 150–400)
RBC: 2.69 MIL/uL — AB (ref 3.87–5.11)
RDW: 13.1 % (ref 11.5–15.5)
WBC: 4.3 10*3/uL (ref 4.0–10.5)

## 2015-12-10 LAB — BLOOD GAS, VENOUS
Acid-base deficit: 5.4 mmol/L — ABNORMAL HIGH (ref 0.0–2.0)
BICARBONATE: 20.1 meq/L (ref 20.0–24.0)
Drawn by: 336101
FIO2: 0.21
O2 Saturation: 81.6 %
PCO2 VEN: 41.8 mmHg — AB (ref 45.0–50.0)
PH VEN: 7.302 — AB (ref 7.250–7.300)
Patient temperature: 98.2
TCO2: 19.3 mmol/L (ref 0–100)
pO2, Ven: 49.4 mmHg — ABNORMAL HIGH (ref 30.0–45.0)

## 2015-12-10 LAB — GLUCOSE, CAPILLARY
GLUCOSE-CAPILLARY: 111 mg/dL — AB (ref 65–99)
GLUCOSE-CAPILLARY: 141 mg/dL — AB (ref 65–99)
GLUCOSE-CAPILLARY: 105 mg/dL — AB (ref 65–99)
GLUCOSE-CAPILLARY: 182 mg/dL — AB (ref 65–99)
Glucose-Capillary: 191 mg/dL — ABNORMAL HIGH (ref 65–99)
Glucose-Capillary: 122 mg/dL — ABNORMAL HIGH (ref 65–99)
Glucose-Capillary: 117 mg/dL — ABNORMAL HIGH (ref 65–99)
Glucose-Capillary: 171 mg/dL — ABNORMAL HIGH (ref 65–99)
Glucose-Capillary: 110 mg/dL — ABNORMAL HIGH (ref 65–99)

## 2015-12-10 LAB — CBG MONITORING, ED: Glucose-Capillary: 425 mg/dL — ABNORMAL HIGH (ref 65–99)

## 2015-12-10 LAB — TSH: TSH: >90 u[IU]/mL — ABNORMAL HIGH (ref 0.350–4.500)

## 2015-12-10 LAB — URINALYSIS, ROUTINE W REFLEX MICROSCOPIC
BILIRUBIN URINE: NEGATIVE
Bilirubin Urine: NEGATIVE
Glucose, UA: 500 mg/dL — AB
KETONES UR: NEGATIVE mg/dL
Ketones, ur: NEGATIVE mg/dL
LEUKOCYTES UA: NEGATIVE
LEUKOCYTES UA: NEGATIVE
NITRITE: NEGATIVE
Nitrite: NEGATIVE
PH: 6.5 (ref 5.0–8.0)
PROTEIN: NEGATIVE mg/dL
Protein, ur: NEGATIVE mg/dL
SPECIFIC GRAVITY, URINE: 1.007 (ref 1.005–1.030)
Specific Gravity, Urine: 1.022 (ref 1.005–1.030)
pH: 6.5 (ref 5.0–8.0)

## 2015-12-10 LAB — TYPE AND SCREEN
ABO/RH(D): O POS
Antibody Screen: NEGATIVE

## 2015-12-10 LAB — MAGNESIUM: Magnesium: 1.9 mg/dL (ref 1.7–2.4)

## 2015-12-10 LAB — PHOSPHORUS: Phosphorus: 2.9 mg/dL (ref 2.5–4.6)

## 2015-12-10 LAB — I-STAT BETA HCG BLOOD, ED (MC, WL, AP ONLY): I-stat hCG, quantitative: <5 m[IU]/mL (ref <5)

## 2015-12-10 LAB — ABO/RH: ABO/RH(D): O POS

## 2015-12-10 MED ORDER — PANTOPRAZOLE SODIUM 40 MG PO TBEC
40.0000 mg | DELAYED_RELEASE_TABLET | Freq: Every day | ORAL | Status: DC
  Administered 2015-12-10: 40 mg via ORAL
  Filled 2015-12-10: qty 1

## 2015-12-10 MED ORDER — DEXTROSE-NACL 5-0.45 % IV SOLN
INTRAVENOUS | Status: DC
  Administered 2015-12-10: 03:00:00 via INTRAVENOUS

## 2015-12-10 MED ORDER — CETYLPYRIDINIUM CHLORIDE 0.05 % MT LIQD
7.0000 mL | Freq: Two times a day (BID) | OROMUCOSAL | Status: DC
  Administered 2015-12-10: 7 mL via OROMUCOSAL

## 2015-12-10 MED ORDER — DEXTROSE-NACL 5-0.45 % IV SOLN: INTRAVENOUS | Status: DC

## 2015-12-10 MED ORDER — ONDANSETRON HCL 4 MG/2ML IJ SOLN
4.0000 mg | Freq: Four times a day (QID) | INTRAMUSCULAR | Status: DC | PRN
  Administered 2015-12-10 (×2): 4 mg via INTRAVENOUS
  Filled 2015-12-10 (×2): qty 2

## 2015-12-10 MED ORDER — SODIUM CHLORIDE 0.9 % IV SOLN
1000.0000 mL | Freq: Once | INTRAVENOUS | Status: AC
  Administered 2015-12-10: 1000 mL via INTRAVENOUS

## 2015-12-10 MED ORDER — ASPIRIN EC 81 MG PO TBEC
81.0000 mg | DELAYED_RELEASE_TABLET | Freq: Every day | ORAL | Status: DC
  Administered 2015-12-10 – 2015-12-11 (×2): 81 mg via ORAL
  Filled 2015-12-10 (×2): qty 1

## 2015-12-10 MED ORDER — AMLODIPINE BESYLATE 10 MG PO TABS
10.0000 mg | ORAL_TABLET | Freq: Every day | ORAL | Status: DC
  Administered 2015-12-10 – 2015-12-11 (×2): 10 mg via ORAL
  Filled 2015-12-10 (×2): qty 1

## 2015-12-10 MED ORDER — METOCLOPRAMIDE HCL 5 MG PO TABS
5.0000 mg | ORAL_TABLET | Freq: Three times a day (TID) | ORAL | Status: DC
  Administered 2015-12-10 – 2015-12-11 (×4): 5 mg via ORAL
  Filled 2015-12-10 (×4): qty 1

## 2015-12-10 MED ORDER — ONDANSETRON HCL 4 MG/2ML IJ SOLN: 4.0000 mg | Freq: Once | INTRAMUSCULAR | Status: DC

## 2015-12-10 MED ORDER — SODIUM CHLORIDE 0.9 % IV SOLN: INTRAVENOUS | Status: DC

## 2015-12-10 MED ORDER — INSULIN REGULAR BOLUS VIA INFUSION
0.0000 [IU] | Freq: Three times a day (TID) | INTRAVENOUS | Status: DC
  Filled 2015-12-10: qty 10

## 2015-12-10 MED ORDER — LEVOTHYROXINE SODIUM 50 MCG PO TABS
175.0000 ug | ORAL_TABLET | Freq: Every day | ORAL | Status: DC
  Administered 2015-12-10 – 2015-12-11 (×2): 175 ug via ORAL
  Filled 2015-12-10 (×2): qty 1

## 2015-12-10 MED ORDER — INSULIN REGULAR HUMAN 100 UNIT/ML IJ SOLN
INTRAMUSCULAR | Status: DC
  Filled 2015-12-10: qty 2.5

## 2015-12-10 MED ORDER — ENOXAPARIN SODIUM 40 MG/0.4ML ~~LOC~~ SOLN
40.0000 mg | SUBCUTANEOUS | Status: DC
  Administered 2015-12-11: 40 mg via SUBCUTANEOUS
  Filled 2015-12-10: qty 0.4

## 2015-12-10 MED ORDER — CYANOCOBALAMIN 250 MCG PO TABS
250.0000 ug | ORAL_TABLET | Freq: Every day | ORAL | Status: DC
  Administered 2015-12-10 – 2015-12-11 (×2): 250 ug via ORAL
  Filled 2015-12-10 (×2): qty 1

## 2015-12-10 MED ORDER — ONDANSETRON HCL 4 MG PO TABS: 4.0000 mg | ORAL_TABLET | Freq: Four times a day (QID) | ORAL | Status: DC | PRN

## 2015-12-10 MED ORDER — ACETAMINOPHEN 325 MG PO TABS: 650.0000 mg | ORAL_TABLET | ORAL | Status: DC | PRN

## 2015-12-10 MED ORDER — PRAVASTATIN SODIUM 20 MG PO TABS
20.0000 mg | ORAL_TABLET | Freq: Every evening | ORAL | Status: DC
  Administered 2015-12-10: 20 mg via ORAL
  Filled 2015-12-10: qty 1

## 2015-12-10 MED ORDER — GABAPENTIN 300 MG PO CAPS
600.0000 mg | ORAL_CAPSULE | Freq: Every day | ORAL | Status: DC
  Administered 2015-12-10: 600 mg via ORAL
  Filled 2015-12-10: qty 2

## 2015-12-10 MED ORDER — INSULIN ASPART 100 UNIT/ML ~~LOC~~ SOLN
0.0000 [IU] | Freq: Three times a day (TID) | SUBCUTANEOUS | Status: DC
  Administered 2015-12-10 – 2015-12-11 (×2): 3 [IU] via SUBCUTANEOUS
  Administered 2015-12-11: 2 [IU] via SUBCUTANEOUS

## 2015-12-10 MED ORDER — ENOXAPARIN SODIUM 30 MG/0.3ML ~~LOC~~ SOLN
30.0000 mg | SUBCUTANEOUS | Status: DC
  Filled 2015-12-10: qty 0.3

## 2015-12-10 MED ORDER — ACETAMINOPHEN 325 MG PO TABS
650.0000 mg | ORAL_TABLET | Freq: Once | ORAL | Status: AC
  Administered 2015-12-10: 650 mg via ORAL
  Filled 2015-12-10: qty 2

## 2015-12-10 MED ORDER — INSULIN GLARGINE 100 UNIT/ML ~~LOC~~ SOLN
15.0000 [IU] | Freq: Every day | SUBCUTANEOUS | Status: DC
  Administered 2015-12-10 – 2015-12-11 (×2): 15 [IU] via SUBCUTANEOUS
  Filled 2015-12-10 (×3): qty 0.15

## 2015-12-10 MED ORDER — SODIUM CHLORIDE 0.9 % IV SOLN
INTRAVENOUS | Status: DC
  Administered 2015-12-10: 5.4 [IU]/h via INTRAVENOUS
  Filled 2015-12-10: qty 2.5

## 2015-12-10 MED ORDER — ACETAMINOPHEN 325 MG PO TABS
650.0000 mg | ORAL_TABLET | Freq: Four times a day (QID) | ORAL | Status: DC | PRN
Start: 2015-12-10 — End: 2015-12-10
  Administered 2015-12-10: 650 mg via ORAL
  Filled 2015-12-10: qty 2

## 2015-12-10 MED ORDER — FERROUS SULFATE 325 (65 FE) MG PO TABS
325.0000 mg | ORAL_TABLET | Freq: Three times a day (TID) | ORAL | Status: DC
  Administered 2015-12-10 – 2015-12-11 (×5): 325 mg via ORAL
  Filled 2015-12-10 (×5): qty 1

## 2015-12-10 MED ORDER — SODIUM CHLORIDE 0.9 % IV SOLN
1000.0000 mL | INTRAVENOUS | Status: DC
  Administered 2015-12-10: 1000 mL via INTRAVENOUS

## 2015-12-10 MED ORDER — DEXTROSE 50 % IV SOLN: 25.0000 mL | INTRAVENOUS | Status: DC | PRN

## 2015-12-10 MED ORDER — INSULIN ASPART 100 UNIT/ML ~~LOC~~ SOLN: 0.0000 [IU] | Freq: Every day | SUBCUTANEOUS | Status: DC

## 2015-12-10 NOTE — Progress Notes (Signed)
Patient is having a HA- it is not time for another tylenol. RN has asked for a one time order.
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
## 2015-12-10 NOTE — Care Management Note (Signed)
Case Management Note  Patient Details  Name: Angela [name] MRN: [id number] Date of Birth: 03-19-1986  Subjective/Objective:       29 yo admitted with hyperglycemia              Action/Plan: From home with parent  Expected Discharge Date:   (unknown)               Expected Discharge Plan:  Home/Self Care  In-House Referral:     Discharge planning Services  CM Consult  Post Acute Care Choice:    Choice offered to:     DME Arranged:    DME Agency:     HH Arranged:    HH Agency:     Status of Service:  In process, will continue to follow  Medicare Important Message Given:    Date Medicare IM Given:    Medicare IM give by:    Date Additional Medicare IM Given:    Additional Medicare Important Message give by:     If discussed at [place] of Stay Meetings, dates discussed:    Additional Comments: Pt is active at the [facility] and Wellness center. She has a blood sugar monitor at home currently. No DC needs identified at this time. CM will continue to follow. Lynnell Catalan, RN 12/10/2015, 10:44 AM

## 2015-12-10 NOTE — Hospital Discharge Follow-Up (Signed)
Transitional Care Clinic Care Coordination Note:  Admit date:  12/09/15 Discharge date: TBD Discharge Disposition: ? Home when stable Patient contact: [phone] Emergency contact(s): Zenaida Deed (mother)-[phone]  This Case Manager reviewed patient's EMR and determined patient would benefit from post-discharge medical management and chronic care management services through the [facility]. Patient has a history of type 1 diabetes mellitus c/b retinopathy, neuropathy, and nephropathy, CKD Stage 4, hypothyroidism, anemia, hypertension. Admitted for hyperglycemia hyperosmolar state, acute on chronic renal failure, facial numbness, anemia of renal disease. Patient has had 3 inpatient admissions in the last 6 months. This Case Manager met with patient to discuss the services and medical management that can be provided at the [facility]. Patient verbalized understanding and agreed to receive post-discharge care at the [facility].   Patient scheduled for Transitional Care appointment on 12/22/15 at 1145 with Dr. Jarold Song.  Clinic information and appointment time provided to patient. Appointment information also placed on AVS.  Assessment:       Home Environment: Patient lives with her mother in a private residence.       Support System: Mother       Level of functioning: Independent       Home DME: none. Patient does indicate she has a glucometer and diabetes supplies at home.       Home care services: none       Transportation: Patient indicated she typically drives to her medical appointments.         Food/Nutrition: Patient indicated she has access to needed food.        Medications: Patient indicated she gets her medications from the pharmacy at [place]. Patient denies any problems affording or obtaining needed medications.        Identified Barriers: uninsured. Patient indicated she has applied for Medicaid but was denied.  Patient denies meeting with Development worker, community at [facility] and [employer].  Informed patient she would benefit from meeting with a Development worker, community at [facility] and [employer] to determine if eligible for the [employer] or [employer]. Patient verbalized understanding.        PCP: Dr. Lenard Gallow[facility] [place]              Arranged services:        Services communicated to Marney Doctor, RN CM

## 2015-12-10 NOTE — ED Notes (Signed)
Critical glucose of 885 Rn Terr[place] made aware.

## 2015-12-10 NOTE — Plan of Care (Signed)
Problem: Food- and Nutrition-Related Knowledge Deficit (NB-1.1) Goal: Nutrition education Formal process to instruct or train a patient/client in a skill or to impart knowledge to help patients/clients voluntarily manage or modify food choices and eating behavior to maintain or improve health. Outcome: Completed/Met Date Met:  12/10/15  RD consulted for nutrition education regarding diabetes.     Lab Results  Component Value Date    HGBA1C 10.70 07/28/2015    RD provided "Carbohydrate Counting for People with Diabetes" and "High Potassium Foods List" handout from the Academy of Nutrition and Dietetics. Discussed different food groups and their effects on blood sugar, emphasizing carbohydrate-containing foods. Provided list of carbohydrates and recommended serving sizes of common foods. Encouraged consuming lower potassium foods, provided list of foods to limit/avoid.  Discussed importance of controlled and consistent carbohydrate intake throughout the day. Provided examples of ways to balance meals/snacks and encouraged intake of high-fiber, whole grain complex carbohydrates. Teach back method used.  Expect fair compliance. Compliance may improve with friend/family support. Per visitor in room, pt does not eat well. She skips meals, eats candy and drinks sugary beverages. Pt would benefit from outpatient education greatly.  Body mass index is 24.32 kg/(m^2). Pt meets criteria for normal range based on current BMI.  Current diet order is CHO modified. Labs and medications reviewed. No further nutrition interventions warranted at this time.  If additional nutrition issues arise, please re-consult RD.  Clayton Bibles, MS, RD, LDN Pager: [phone] After Hours Pager: [phone]

## 2015-12-10 NOTE — Progress Notes (Signed)
Patient seen and evaluated earlier the same by my associate. Please refer to H&P for details regarding assessment and plan.  Patient was transitioned to subcutaneous insulin.  Patient seen and evaluated Gen.: Patient in no acute distress alert and awake Cardiovascular: No cyanosis Pulmonary: No increased work of breathing, no wheezes, equal chest rise  We'll plan on reassessing next a.m.  Janaia Kozel, [employer]

## 2015-12-10 NOTE — Progress Notes (Addendum)
Inpatient Diabetes Program Recommendations  AACE/ADA: New Consensus Statement on Inpatient Glycemic Control (2015)  Target Ranges:  Prepandial:   less than 140 mg/dL      Peak postprandial:   less than 180 mg/dL (1-2 hours)      Critically ill patients:  140 - 180 mg/dL   Results for Angela [name], Angela [name] (MRN [id number]) as of 12/10/2015 08:51  Ref. Range 12/09/2015 23:44  Sodium Latest Ref Range: 135-145 mmol/L 124 (L)  Potassium Latest Ref Range: 3.5-5.1 mmol/L 5.3 (H)  Chloride Latest Ref Range: 101-111 mmol/L 90 (L)  CO2 Latest Ref Range: 22-32 mmol/L 22  BUN Latest Ref Range: 6-20 mg/dL 52 (H)  Creatinine Latest Ref Range: 0.44-1.00 mg/dL 2.62 (H)  Calcium Latest Ref Range: 8.9-10.3 mg/dL 8.6 (L)  EGFR (Non-African Amer.) Latest Ref Range: >60 mL/min 23 (L)  EGFR (African American) Latest Ref Range: >60 mL/min 27 (L)  Glucose Latest Ref Range: 65-99 mg/dL 885 (HH)  Anion gap Latest Ref Range: 5-15  12    Admit with: Hyperglycemia/ Nausea  History: DM, CKD  Home DM Meds: Toujeo insulin 30 units QHS         Novolog 15 units tidwc         Novolog SSI  Current Insulin Orders: IV Insulin drip     -Patient admitted with several days of nausea and elevated CBGs.  -Treated with IVF and started on IV insulin drip in the ED.    -Note patient to be transitioned off IV insulin drip this AM.  Was given 15 units Lantus at 7am today.     MD- Please consider starting Novolog Sensitive SSI (0-9 units) TID AC + HS  Patient will likely need Novolog Meal Coverage as well once she starts eating.  Would start with Novolog 4 units tidwc.   Addendum 1350: Spoke with patient and Mom this afternoon.  Patient stated to [facility].  Gets her insulins from the [facility] pharmacy.  Uses [employer] meter for affordability.  Patient told me she takes Toujeo insulin 30 units QHS and doses her Novolog as 15 units tidwc + Novolog SSI.  Stated her goal CBG is 200 mg/dl.  Sees Dr.  Adrian Blackwater every 3-6 months and when she is sick.  Patient and Mom had questions about nutrition.  Note RD visited with patient and Mom earlier.  Reviewed basic carbohydrate counting and reviewed food label reading with patient.  Gave pt and Mom basic carb counting guide.  Reminded patient about the importance of taking insulin with her food since she does not make insulin.  Mom had concerns that patient not eating enough.  Encouraged patient to discuss with Dr. Adrian Blackwater altering her diet to 6 small meals per day with insulin adjustments to cover those meals.  Reminded patient that if she eats less, she may need to take less Novolog.  When she eats a higher carbohydrate meal, she may need more insulin to cover her food.  Explained to patient that we have drawn a current A1c.  Results are pending.  Patient knows what an A1c is and told me her last A1c was around 10%.  Reminded patient that her goal A1c is 7% to prevent long-term complications.  Patient acknowledged this and thanked me for the information.   --Will follow patient during hospitalization--  Wyn Quaker RN, MSN, CDE Diabetes Coordinator Inpatient Glycemic Control Team Team Pager: [phone] (8a-5p)

## 2015-12-10 NOTE — Progress Notes (Signed)
CBG 111 glucose stabilizer at 0.0. MD notified of blood glucose. Will continue to monitor.

## 2015-12-11 LAB — GLUCOSE, CAPILLARY
Glucose-Capillary: 127 mg/dL — ABNORMAL HIGH (ref 65–99)
Glucose-Capillary: 153 mg/dL — ABNORMAL HIGH (ref 65–99)

## 2015-12-11 LAB — HEMOGLOBIN A1C
HEMOGLOBIN A1C: 13 % — AB (ref 4.8–5.6)
Mean Plasma Glucose: 326 mg/dL

## 2015-12-11 NOTE — Discharge Summary (Signed)
Physician Discharge Summary  Angela [name] [id number] DOB: [birth year] DOA: 12/09/2015  PCP: Minerva Ends, MD  Admit date: 12/09/2015 Discharge date: 12/11/2015  Time spent: > 35 minutes  Recommendations for Outpatient Follow-up:  1. Please follow-up with your primary care physician for further evaluation recommendations. Also follow-up with your dentist for further recommendations regarding your active dental disease  2. 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 female with a past medical history significant for T1DM c/b retinopathy, neuropathy, and nephropathy, CKD stage 4 with proteinuria followed sporadically by [facility], hypothyroidism and anemia of renal disease who presents with 2 weeks emesis and hyperglycemia.  The patient describes about 2 weeks of intermittent emesis most days and hyperglycemia in the 400s to "unreadable". During this time she's been taking her Tuojeo every day but skipping her mealtime Humalog periodically. Over the last 3 days, she's felt more tired, "drained", had vomiting and diarrhea that won't stop, and worsening malaise and so she came to the ER.  Hospital Course:  DKA - Resolved patient will go back home on home regimen. - Due to noncompliance with home medication regimen secondary to stringent work hours per my  discussion with patient  Otherwise for known home comorbidities continue home medication regimen  Procedures:  None  Consultations:  None  Discharge Exam: Filed Vitals:   12/10/15 2045 12/11/15 0456  BP: 121/81 121/89  Pulse: 88 80  Temp: 98.4 F (36.9 C) 97.7 F (36.5 C)  Resp: 16 16    General: Pt in nad, alert and awake Cardiovascular: rrr, no mrg Respiratory: cta bl, no wheezes Oropharynx: poor oral hygiene   Discharge Instructions   Discharge Instructions    Amb Referral to Nutrition and Diabetic E    Complete by:  As directed      Call MD for:  difficulty breathing, headache or visual disturbances    Complete by:  As directed      Call MD for:  extreme fatigue    Complete by:  As directed      Call MD for:  temperature >100.4    Complete by:  As directed      Diet - low sodium heart healthy    Complete by:  As directed      Discharge instructions    Complete by:  As directed   Please continue to take your diabetic medication despite stringent work schedule.     Increase activity slowly    Complete by:  As directed           Current Discharge Medication List    CONTINUE these medications which have NOT CHANGED   Details  albuterol (PROVENTIL HFA;VENTOLIN HFA) 108 (90 BASE) MCG/ACT inhaler Inhale 1-2 puffs into the lungs every 6 (six) hours as needed for wheezing or shortness of breath. Qty: 3.7 g, Refills: 12  amLODipine (NORVASC) 10 MG tablet Take 1 tablet (10 mg total) by mouth daily. Qty: 30 tablet, Refills: 2    aspirin EC 81 MG tablet Take 1 tablet (81 mg total) by mouth daily.    busPIRone (BUSPAR) 7.5 MG tablet Take 1 tablet (7.5 mg total) by mouth 2 (two) times daily with a meal. Qty: 60 tablet, Refills: 2    ferrous sulfate 325 (65 FE) MG tablet Take 1 tablet (325 mg total) by mouth 3 (three) times daily with meals. Qty: 90 tablet, Refills: 3   Associated Diagnoses: Anemia in chronic renal disease    furosemide (LASIX) 40 MG tablet  Take 1 tablet (40 mg total) by mouth daily. Qty: 90 tablet, Refills: 1    gabapentin (NEURONTIN) 300 MG capsule Take 2 capsules (600 mg total) by mouth at bedtime. 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 0-15 Units, Subcutaneous, 3 times daily with meals CBG < 70: Call MD CBG 70 - 120: 0 units CBG 121 - 150: 2 units CBG 151 - 200: 3 units CBG 201 - 250: 5 units CBG 251 - 300: 8 units CBG 301 - 350: 11 units CBG 351 - 400: 15 units CBG > 400: call MD Qty: 10 mL, Refills: 0   Associated Diagnoses: Type 1 diabetes, uncontrolled, with peripheral circulatory disorder (HCC)    Insulin Glargine (TOUJEO SOLOSTAR) 300 UNIT/ML SOPN Inject 30 Units into the skin at bedtime. Qty: 1.5 mL, Refills: 3   Associated Diagnoses: Type 1 diabetes, uncontrolled, with peripheral circulatory disorder (HCC)    levothyroxine (SYNTHROID, LEVOTHROID) 175 MCG tablet Take 1 tablet (175 mcg total) by mouth daily before breakfast. Qty: 30 tablet, Refills: 2   Associated Diagnoses: Other specified hypothyroidism    metoCLOPramide (REGLAN) 5 MG tablet Take 1 tablet (5 mg total) by mouth 4 (four) times daily -  before meals and at bedtime. Qty: 120 tablet, Refills: 2   Associated Diagnoses: Diabetic gastroparesis associated with type 1 diabetes mellitus (HCC)    pantoprazole (PROTONIX) 40 MG tablet Take 1 tablet (40 mg total) by mouth daily before supper. Qty: 30 tablet, Refills: 1   Associated Diagnoses: Left flank pain    pravastatin (PRAVACHOL) 20 MG tablet Take 1 tablet (20 mg total) by mouth every evening. Qty: 90 tablet, Refills: 3   Associated Diagnoses: Diabetic peripheral neuropathy associated with type 1 diabetes mellitus (HCC)    vitamin B-12 (CYANOCOBALAMIN) 250 MCG tablet Take 1 tablet (250 mcg  total) by mouth daily. Qty: 14 tablet, Refills: 0       Allergies  Allergen Reactions  . Sulfamethoxazole Hives and Itching  . Trimethoprim Hives   Follow-up Information    Follow up with [facility] On 12/22/2015.   Why:  [facility] appointment on 12/22/15 at 11:45 am with Dr. Jarold Song.   Contact information:   [address] [place] [phone]       The results of significant diagnostics from this hospitalization (including imaging, microbiology, ancillary and laboratory) are listed below for reference.    Significant Diagnostic Studies: Mr Herby Abraham Contrast  12/10/2015  CLINICAL DATA:  28 year old female who awoke with left face numbness which has regressed but not resolved. Initial encounter. EXAM: MRI HEAD WITHOUT CONTRAST TECHNIQUE: Multiplanar, multiecho  pulse sequences of the brain and surrounding structures were obtained without intravenous contrast. COMPARISON:  Head CT without contrast 09/08/2013. FINDINGS: Cerebral volume is normal. No restricted diffusion to suggest acute infarction. No midline shift, mass effect, evidence of mass lesion, ventriculomegaly, extra-axial collection or acute intracranial hemorrhage. Cervicomedullary junction and pituitary are within normal limits. Negative visualized cervical spine. Major intracranial vascular flow voids are within normal limits. Pearline Cables and white matter signal is within normal limits throughout the brain. No encephalomalacia or chronic cerebral blood products. Cisternal fifth nerve segments appear normal. Noncontrast Meckel cave and cavernous sinuses appear normal. Noncontrast left V2 and V3 trunks appear normal. Mild bilateral mastoid effusions. Trace retained secretions in the nasopharynx. Otherwise Visible internal auditory structures appear normal. Subtotal opacification of the right maxillary sinus may be due to mucous retention cyst. Trace paranasal sinus  mucosal thickening elsewhere. Negative orbit and scalp soft tissues. Visualized bone marrow signal is within normal limits. IMPRESSION: 1. Normal noncontrast MRI appearance of the brain. 2. Right maxillary sinus inflammation. Mild bilateral mastoid effusions. Electronically Signed   By: [name] M.D.   On: 12/10/2015 11:00    Microbiology: No results found for this or any previous visit (from the past 240 hour(s)).   Labs: Basic Metabolic Panel:  Recent Labs Lab 12/09/15 2344 12/10/15 0338  NA 124* 135  K 5.3* 4.0  CL 90* 101  CO2 22 25  GLUCOSE 885* 183*  BUN 52* 45*  CREATININE 2.62* 2.12*  CALCIUM 8.6* 8.5*  MG  --  1.9  PHOS  --  2.9   Liver Function Tests: No results for input(s): AST, ALT, ALKPHOS, BILITOT, PROT, ALBUMIN in the last 168 hours. No results for input(s): LIPASE, AMYLASE in the last 168 hours. No results for input(s): AMMONIA in the last 168 hours. CBC:  Recent Labs Lab 12/09/15 2344 12/10/15 0338  WBC 4.8 4.3  HGB 7.8* 7.4*  HCT 25.4* 22.7*  MCV 89.8 84.4  PLT 143* 129*   Cardiac Enzymes: No results for input(s): CKTOTAL, CKMB, CKMBINDEX, TROPONINI in the last 168 hours. BNP: BNP (last 3 results) No results for input(s): BNP in the last 8760 hours.  ProBNP (last 3 results) No results for input(s): PROBNP in the last 8760 hours.  CBG:  Recent Labs Lab 12/10/15 1306 12/10/15 1749 12/10/15 2041 12/11/15 0758 12/11/15 1212  GLUCAP 171* 110* 182* 127* 153*    Signed:  Velvet Bathe MD  FACP  [facility] 12/11/2015, 3:04 PM

## 2015-12-15 ENCOUNTER — Telehealth: Payer: Self-pay

## 2015-12-15 NOTE — Telephone Encounter (Signed)
[facility] Clinic Post-discharge Follow-Up Phone Call:  Attempt #1  Date of Discharge:12/11/2015 Principal Discharge Diagnosis(es): hyperglycemia.  Post-discharge Communication:  Call placed to the patient  # [phone] and a HIPAA compliant voice mail message was left requesting a call back to # [phone] or [phone]. Call then placed to the patient's mother, Zenaida Deed # [phone] who stated that she would inform her daughter to return the call to CM.  The patient had informed Carmela Hurt, RN CM that her mother was her emergency contact and she provided her mother's phone #.  Call Completed: No

## 2015-12-16 ENCOUNTER — Telehealth: Payer: Self-pay

## 2015-12-16 NOTE — Telephone Encounter (Signed)
[facility] Clinic Post-discharge Follow-Up Phone Call:  Date of Discharge: 12/11/2015 Principal Discharge Diagnosis(es):  DKA, hypothyroidism, CKD Post-discharge Communication: Call placed to the patient Call Completed: Yes                With Whom: patient Interpreter Needed: No    Please check all that apply:  X Patient is knowledgeable of his/her condition(s) and/or treatment. X Patient is caring for self at home.  ? Patient is receiving assist at home from family and/or caregiver. Family and/or caregiver is knowledgeable of patient's condition(s) and/or treatment. ? Patient is receiving home health services. If so, name of agency.     Medication Reconciliation:  ? Medication list reviewed with patient. X  Patient obtained all discharge medications.Yes, she has all of her medications.  She stated that she is not on any new medications and she does not need to review her medication list.  She also reported that she is taking the medications, including the insulin, as ordered.  She said that she has been checking her blood sugar twice a day and her blood sugars have been in the low 200's which is good for her.     Activities of Daily Living:  X  Independent ? Needs assist (describe; ? home DME used) ? [facility] (describe, ? home DME used)   Community resources in place for patient:  X  None  ? Home Health/Home DME ? Assisted Living ? Support Group          Patient Education:  Discussed the services provided at [facility] including financial counseling and social work. She stated that she needs to apply for the [facility]. She does not want to schedule an appointment ahead of time and stated that she would get more information about the orange card when she comes to her appointment next week. Explained to her that she could stop in the clinic prior to her appointment next week and pick up a packet of information with what she needs to have to apply for the [facility]. She  also stated that she needs to some dental work done and will discuss with the doctor next week.  Explained to her that there are some options available for individuals without insurance. She was very appreciative of the information.         Questions/Concerns discussed: Confirmed her appointment for 12/22/15 @ 1145.  She stated that she is  " doing fine."   No other questions/problems reported.

## 2015-12-17 ENCOUNTER — Telehealth: Payer: Self-pay

## 2015-12-17 NOTE — Telephone Encounter (Signed)
This Case Manager placed call to patient to check on status and to remind patient of [facility] appointment on 12/22/15 at 1145 with Dr. Jarold Song. Call placed to [phone]; voicemail left requesting return call.  Also placed call to [phone]; message left with patient's mother requesting return call from patient.

## 2015-12-18 ENCOUNTER — Telehealth: Payer: Self-pay

## 2015-12-18 NOTE — Telephone Encounter (Signed)
This Case Manager placed call to patient to remind her of upcoming [facility] appointment on 12/22/15 at 1145 with Dr. Jarold Song. Patient aware of appointment and indicated she will have transportation. In addition, informed patient to keep record of her blood glucose readings and to bring log to her appointment. Patient verbalized understanding. No additional needs/concerns identified.

## 2015-12-22 ENCOUNTER — Inpatient Hospital Stay: Payer: Self-pay | Admitting: Family Medicine

## 2015-12-23 ENCOUNTER — Telehealth: Payer: Self-pay

## 2015-12-23 NOTE — Telephone Encounter (Signed)
This Case Manager placed call to patient to discuss rescheduling [facility] appointment with Dr. Jarold Song as patient missed appointment on 12/22/15. Unable to reach patient; voicemail left requesting return call.

## 2015-12-24 ENCOUNTER — Telehealth: Payer: Self-pay

## 2015-12-24 ENCOUNTER — Ambulatory Visit: Payer: Self-pay | Admitting: *Deleted

## 2015-12-24 NOTE — Telephone Encounter (Signed)
This Case Manager placed call to patient to discuss rescheduling [facility] appointment with Dr. Jarold Song as patient missed appointment on 12/22/15. Call placed to [phone]; unable to reach patient. Voicemail left requesting return call. In addition, placed call to [phone].  HIPPA compliant message left requesting return call.

## 2015-12-28 ENCOUNTER — Telehealth: Payer: Self-pay

## 2015-12-28 NOTE — Telephone Encounter (Signed)
Attempted to contact the patient to check on her status and to discuss scheduling a follow up appointment in the clinic.  Call placed to # [phone] (M) and a HIPAA compliant voice mail message was left requesting a call back to # [phone] or [phone].

## 2015-12-29 ENCOUNTER — Telehealth: Payer: Self-pay

## 2015-12-29 NOTE — Telephone Encounter (Signed)
Call placed to the patient to check on her status. She said that she missed her last appointment because she was "stuck out of town."   She was agreeable to scheduling another appointment and one was scheduled for 01/05/16@ 1100.  The patient said that she has transportation to the clinic. Instructed her to bring all of her medication with her as well as her blood sugar log and she stated that she would . She noted that her blood sugar this morning was 189 but she has had readings in the 300's recently and she also has experienced episodes of vomiting recently but has no complaint of nausea/vomiting at this time and stated that she is feeling okay." .  No other problems/ questions reported

## 2016-01-04 ENCOUNTER — Telehealth: Payer: Self-pay

## 2016-01-04 ENCOUNTER — Inpatient Hospital Stay: Payer: Self-pay | Admitting: Family Medicine

## 2016-01-04 NOTE — Telephone Encounter (Signed)
Call placed to the patient and confirmed  her of her appointment at the [facility] ( [facility] ) at the [facility] tomorrow, 01/05/16 @1100 .  She had reported that she has transportation. Reminded her to bring her medications and her blood sugar log to her appointment tomorrow and she stated that she would.  She noted that she continues to have occasional episodes of vomiting and will discuss with the doctor tomorrow.  No other problems/ questions reported.

## 2016-01-05 ENCOUNTER — Inpatient Hospital Stay: Payer: Self-pay | Admitting: Family Medicine

## 2016-01-05 ENCOUNTER — Ambulatory Visit: Payer: Self-pay | Attending: Family Medicine | Admitting: Family Medicine

## 2016-01-05 ENCOUNTER — Encounter: Payer: Self-pay | Admitting: Family Medicine

## 2016-01-05 VITALS — BP 132/85 | HR 98 | Temp 97.8°F | Resp 13 | Ht 65.0 in | Wt 145.8 lb

## 2016-01-05 DIAGNOSIS — Z7982 Long term (current) use of aspirin: Secondary | ICD-10-CM | POA: Insufficient documentation

## 2016-01-05 DIAGNOSIS — Z91199 Patient's noncompliance with other medical treatment and regimen due to unspecified reason: Secondary | ICD-10-CM | POA: Insufficient documentation

## 2016-01-05 DIAGNOSIS — Z9114 Patient's other noncompliance with medication regimen: Secondary | ICD-10-CM | POA: Insufficient documentation

## 2016-01-05 DIAGNOSIS — E1051 Type 1 diabetes mellitus with diabetic peripheral angiopathy without gangrene: Secondary | ICD-10-CM

## 2016-01-05 DIAGNOSIS — E1043 Type 1 diabetes mellitus with diabetic autonomic (poly)neuropathy: Secondary | ICD-10-CM

## 2016-01-05 DIAGNOSIS — I10 Essential (primary) hypertension: Secondary | ICD-10-CM

## 2016-01-05 DIAGNOSIS — Z9119 Patient's noncompliance with other medical treatment and regimen: Secondary | ICD-10-CM

## 2016-01-05 DIAGNOSIS — I129 Hypertensive chronic kidney disease with stage 1 through stage 4 chronic kidney disease, or unspecified chronic kidney disease: Secondary | ICD-10-CM | POA: Insufficient documentation

## 2016-01-05 DIAGNOSIS — N1831 Chronic kidney disease, stage 3a: Secondary | ICD-10-CM

## 2016-01-05 DIAGNOSIS — K3184 Gastroparesis: Secondary | ICD-10-CM | POA: Insufficient documentation

## 2016-01-05 DIAGNOSIS — E038 Other specified hypothyroidism: Secondary | ICD-10-CM

## 2016-01-05 DIAGNOSIS — E782 Mixed hyperlipidemia: Secondary | ICD-10-CM | POA: Insufficient documentation

## 2016-01-05 DIAGNOSIS — R739 Hyperglycemia, unspecified: Secondary | ICD-10-CM

## 2016-01-05 DIAGNOSIS — N183 Chronic kidney disease, stage 3 unspecified: Secondary | ICD-10-CM

## 2016-01-05 DIAGNOSIS — E1022 Type 1 diabetes mellitus with diabetic chronic kidney disease: Secondary | ICD-10-CM

## 2016-01-05 DIAGNOSIS — IMO0002 Reserved for concepts with insufficient information to code with codable children: Secondary | ICD-10-CM

## 2016-01-05 DIAGNOSIS — E1042 Type 1 diabetes mellitus with diabetic polyneuropathy: Secondary | ICD-10-CM | POA: Insufficient documentation

## 2016-01-05 DIAGNOSIS — E039 Hypothyroidism, unspecified: Secondary | ICD-10-CM | POA: Insufficient documentation

## 2016-01-05 DIAGNOSIS — E1065 Type 1 diabetes mellitus with hyperglycemia: Secondary | ICD-10-CM | POA: Insufficient documentation

## 2016-01-05 DIAGNOSIS — Z794 Long term (current) use of insulin: Secondary | ICD-10-CM | POA: Insufficient documentation

## 2016-01-05 DIAGNOSIS — E785 Hyperlipidemia, unspecified: Secondary | ICD-10-CM | POA: Insufficient documentation

## 2016-01-05 LAB — POCT URINALYSIS DIPSTICK
Bilirubin, UA: NEGATIVE
Glucose, UA: 500
KETONES UA: 40
LEUKOCYTES UA: NEGATIVE
Nitrite, UA: NEGATIVE
PH UA: 5.5
PROTEIN UA: 100
SPEC GRAV UA: 1.01
UROBILINOGEN UA: 0.2

## 2016-01-05 LAB — GLUCOSE, POCT (MANUAL RESULT ENTRY): POC GLUCOSE: 417 mg/dL — AB (ref 70–99)

## 2016-01-05 MED ORDER — PANTOPRAZOLE SODIUM 40 MG PO TBEC: 40.0000 mg | DELAYED_RELEASE_TABLET | Freq: Every day | ORAL | Status: DC

## 2016-01-05 MED ORDER — PRAVASTATIN SODIUM 20 MG PO TABS: 20.0000 mg | ORAL_TABLET | Freq: Every evening | ORAL | Status: DC

## 2016-01-05 MED ORDER — SODIUM CHLORIDE 0.9 % IV SOLN
INTRAVENOUS | Status: DC
  Administered 2016-01-05: 1000 mL via INTRAVENOUS

## 2016-01-05 MED ORDER — METOCLOPRAMIDE HCL 5 MG PO TABS: 5.0000 mg | ORAL_TABLET | Freq: Three times a day (TID) | ORAL | Status: DC

## 2016-01-05 MED ORDER — LEVOTHYROXINE SODIUM 175 MCG PO TABS: 175.0000 ug | ORAL_TABLET | Freq: Every day | ORAL | Status: DC

## 2016-01-05 MED ORDER — INSULIN ASPART 100 UNIT/ML ~~LOC~~ SOLN
10.0000 [IU] | Freq: Once | SUBCUTANEOUS | Status: AC
  Administered 2016-01-05: 10 [IU] via SUBCUTANEOUS

## 2016-01-05 MED ORDER — INSULIN GLARGINE 300 UNIT/ML ~~LOC~~ SOPN: 30.0000 [IU] | PEN_INJECTOR | Freq: Every day | SUBCUTANEOUS | Status: DC

## 2016-01-05 MED ORDER — AMLODIPINE BESYLATE 10 MG PO TABS: 10.0000 mg | ORAL_TABLET | Freq: Every day | ORAL | Status: DC

## 2016-01-05 MED ORDER — INSULIN ASPART 100 UNIT/ML ~~LOC~~ SOLN: 15.0000 [IU] | Freq: Three times a day (TID) | SUBCUTANEOUS | Status: DC

## 2016-01-05 MED FILL — AMLODIPINE BESYLATE 10 MG T: 10 | 30 days supply | Qty: 30 | Fill #0

## 2016-01-05 MED FILL — PRAVASTATIN NA 20 MG TAB: 20 | 30 days supply | Qty: 30 | Fill #0

## 2016-01-05 MED FILL — GABAPENTIN 300 MG CAPSULE: 300 | 30 days supply | Qty: 60 | Fill #3

## 2016-01-05 MED FILL — !TOUJEO SOLOSTAR 300 UNITS/: 300/ML | 30 days supply | Qty: 2 | Fill #0

## 2016-01-05 MED FILL — LEVOTHYROXINE 175 MCG TAB: 175 | 30 days supply | Qty: 30 | Fill #0

## 2016-01-05 MED FILL — METOCLOPRAMIDE 10 MG TABLET: 10 | 30 days supply | Qty: 60 | Fill #0

## 2016-01-05 MED FILL — ?PANTOPRAZOLE SOD DR 40MG: 40 MG | 30 days supply | Qty: 30 | Fill #0

## 2016-01-05 NOTE — Patient Instructions (Signed)
Diabetes Mellitus and Food It is important for you to manage your blood sugar (glucose) level. Your blood glucose level can be greatly affected by what you eat. Eating healthier foods in the appropriate amounts throughout the day at about the same time each day will help you control your blood glucose level. It can also help slow or prevent worsening of your diabetes mellitus. Healthy eating may even help you improve the level of your blood pressure and reach or maintain a healthy weight.  General recommendations for healthful eating and cooking habits include:  Eating meals and snacks regularly. Avoid going long periods of time without eating to lose weight.  Eating a diet that consists mainly of plant-based foods, such as fruits, vegetables, nuts, legumes, and whole grains.  Using low-heat cooking methods, such as baking, instead of high-heat cooking methods, such as deep frying. Work with your dietitian to make sure you understand how to use the Nutrition Facts information on food labels. HOW CAN FOOD AFFECT ME? Carbohydrates Carbohydrates affect your blood glucose level more than any other type of food. Your dietitian will help you determine how many carbohydrates to eat at each meal and teach you how to count carbohydrates. Counting carbohydrates is important to keep your blood glucose at a healthy level, especially if you are using insulin or taking certain medicines for diabetes mellitus. Alcohol Alcohol can cause sudden decreases in blood glucose (hypoglycemia), especially if you use insulin or take certain medicines for diabetes mellitus. Hypoglycemia can be a life-threatening condition. Symptoms of hypoglycemia (sleepiness, dizziness, and disorientation) are similar to symptoms of having too much alcohol.  If your health care provider has given you approval to drink alcohol, do so in moderation and use the following guidelines:  Women should not have more than one drink per day, and men  should not have more than two drinks per day. One drink is equal to:  12 oz of beer.  5 oz of wine.  1 oz of hard liquor.  Do not drink on an empty stomach.  Keep yourself hydrated. Have water, diet soda, or unsweetened iced tea.  Regular soda, juice, and other mixers might contain a lot of carbohydrates and should be counted. WHAT FOODS ARE NOT RECOMMENDED? As you make food choices, it is important to remember that all foods are not the same. Some foods have fewer nutrients per serving than other foods, even though they might have the same number of calories or carbohydrates. It is difficult to get your body what it needs when you eat foods with fewer nutrients. Examples of foods that you should avoid that are high in calories and carbohydrates but low in nutrients include:  Trans fats (most processed foods list trans fats on the Nutrition Facts label).  Regular soda.  Juice.  Candy.  Sweets, such as cake, pie, doughnuts, and cookies.  Fried foods. WHAT FOODS CAN I EAT? Eat nutrient-rich foods, which will nourish your body and keep you healthy. The food you should eat also will depend on several factors, including:  The calories you need.  The medicines you take.  Your weight.  Your blood glucose level.  Your blood pressure level.  Your cholesterol level. You should eat a variety of foods, including:  Protein.  Lean cuts of meat.  Proteins low in saturated fats, such as fish, egg whites, and beans. Avoid processed meats.  Fruits and vegetables.  Fruits and vegetables that may help control blood glucose levels, such as apples, mangoes, and   yams.  Dairy products.  Choose fat-free or low-fat dairy products, such as milk, yogurt, and cheese.  Grains, bread, pasta, and rice.  Choose whole grain products, such as multigrain bread, whole oats, and brown rice. These foods may help control blood pressure.  Fats.  Foods containing healthful fats, such as nuts,  avocado, olive oil, canola oil, and fish. DOES EVERYONE WITH DIABETES MELLITUS HAVE THE SAME MEAL PLAN? Because every person with diabetes mellitus is different, there is not one meal plan that works for everyone. It is very important that you meet with a dietitian who will help you create a meal plan that is just right for you.   This information is not intended to replace advice given to you by your health care provider. Make sure you discuss any questions you have with your health care provider.   Document Released: 09/01/2005 Document Revised: 12/26/2014 Document Reviewed: 11/01/2013 Elsevier Interactive Patient Education 2016 [employer]  

## 2016-01-05 NOTE — Progress Notes (Signed)
Patient needs refills on all medications She has not taken any medications today except 10 units Novolog 30 minutes ago after eating french toast,4 eggs, and [place] bacon

## 2016-01-06 NOTE — Progress Notes (Signed)
[place]  Dates of telephone encounter: 12/15/15, 12/11/15  Admit date: 12/09/15 Discharge date: 12/11/15  PCP: Dr Adrian Blackwater  Subjective:  Patient ID: Angela [name], female    DOB: Oct 27, 1986  Age: 30 y.o. MRN: [id number]  CC: Diabetes   HPI Angela [name] easier 30 year old female with a history of type 1 diabetes mellitus (A1c 13.0), diabetic neuropathy, hyperlipidemia, stage III chronic kidney disease, hypertension, hypothyroidism, noncompliance comes to the clinic for follow-up visit after hospitalization at [facility] from 12/09/15-12/11/15 for hyperglycemia due to type 1 diabetes mellitus.  She had presented to the ED with vomiting, lethargy, diarrhea and hyperglycemia in the 400s. Blood sugar on presentation was 885 with normal anion gap and she had admitted to quitting most of her medications. She had a hemoglobin of 7.8 which is down from previous of 10. She was placed on IV insulin which was later transitioned to subcutaneous insulin; she did have an MRI of the brain due to complaints of facial numbness which came back negative for any acute stroke. She was subsequently discharged with recommendations to follow-up here in the clinic.  Interval history: Her blood sugar is 417 in the clinic and she admits to not taking her Tujeo last night,  She also ate a huge meal an hour ago but took her 10 units of NovoLog. She is been out of her medications for over 2 weeks.  Outpatient Prescriptions Prior to Visit  Medication Sig Dispense Refill  . albuterol (PROVENTIL HFA;VENTOLIN HFA) 108 (90 BASE) MCG/ACT inhaler Inhale 1-2 puffs into the lungs every 6 (six) hours as needed for wheezing or shortness of breath. 3.7 g 12  . aspirin EC 81 MG tablet Take 1 tablet (81 mg total) by mouth daily.    . busPIRone (BUSPAR) 7.5 MG tablet Take 1 tablet (7.5 mg total) by mouth 2 (two) times daily with a meal. 60 tablet 2  . ferrous sulfate 325 (65 FE) MG tablet Take 1 tablet (325 mg  total) by mouth 3 (three) times daily with meals. 90 tablet 3  . furosemide (LASIX) 40 MG tablet Take 1 tablet (40 mg total) by mouth daily. 90 tablet 1  . gabapentin (NEURONTIN) 300 MG capsule Take 2 capsules (600 mg total) by mouth at bedtime. 60 capsule 11  . vitamin B-12 (CYANOCOBALAMIN) 250 MCG tablet Take 1 tablet (250 mcg total) by mouth daily. 14 tablet 0  . amLODipine (NORVASC) 10 MG tablet Take 1 tablet (10 mg total) by mouth daily. 30 tablet 2  . insulin aspart (NOVOLOG) 100 UNIT/ML injection Inject 15 Units into the skin 3 (three) times daily with meals. 0-15 Units, Subcutaneous, 3 times daily with meals CBG < 70: Call MD CBG 70 - 120: 0 units CBG 121 - 150: 2 units CBG 151 - 200: 3 units CBG 201 - 250: 5 units CBG 251 - 300: 8 units CBG 301 - 350: 11 units CBG 351 - 400: 15 units CBG > 400: call MD 10 mL 0  . Insulin Glargine (TOUJEO SOLOSTAR) 300 UNIT/ML SOPN Inject 30 Units into the skin at bedtime. 1.5 mL 3  . levothyroxine (SYNTHROID, LEVOTHROID) 175 MCG tablet Take 1 tablet (175 mcg total) by mouth daily before breakfast. 30 tablet 2  . metoCLOPramide (REGLAN) 5 MG tablet Take 1 tablet (5 mg total) by mouth 4 (four) times daily -  before meals and at bedtime. 120 tablet 2  . pantoprazole (PROTONIX) 40 MG tablet Take 1 tablet (40 mg total) by mouth daily  before supper. 30 tablet 1  . pravastatin (PRAVACHOL) 20 MG tablet Take 1 tablet (20 mg total) by mouth every evening. 90 tablet 3   No facility-administered medications prior to visit.    ROS Review of Systems  Constitutional: Negative for activity change, appetite change and fatigue.  HENT: Negative for congestion, sinus pressure and sore throat.   Eyes: Negative for visual disturbance.  Respiratory: Negative for cough, chest tightness, shortness of breath and wheezing.   Cardiovascular: Negative for chest pain and palpitations.  Gastrointestinal: Negative for abdominal pain, constipation and abdominal distention.    Endocrine: Negative for polydipsia.  Genitourinary: Negative for dysuria and frequency.  Musculoskeletal: Negative for back pain and arthralgias.  Skin: Negative for rash.  Neurological: Negative for tremors, light-headedness and numbness.  Hematological: Does not bruise/bleed easily.  Psychiatric/Behavioral: Negative for behavioral problems and agitation.    Objective:  BP 132/85 mmHg  Pulse 98  Temp(Src) 97.8 F (36.6 C)  Resp 13  Ht 5\' 5"  (1.651 m)  Wt 145 lb 12.8 oz (66.134 kg)  BMI 24.26 kg/m2  SpO2 98%  BP/Weight 01/05/2016 12/11/2015 [id number]  Systolic BP Q000111Q 123XX123 -  Diastolic BP 85 89 -  Wt. (Lbs) 145.8 - 146.17  BMI 24.26 - 24.32      Physical Exam  Constitutional: She is oriented to person, place, and time. She appears well-developed and well-nourished. No distress.  HENT:  Head: Normocephalic.  Right Ear: External ear normal.  Left Ear: External ear normal.  Nose: Nose normal.  Mouth/Throat: Oropharynx is clear and moist.  Eyes: Conjunctivae and EOM are normal. Pupils are equal, round, and reactive to light.  Neck: Normal range of motion. No JVD present.  Cardiovascular: Normal rate, regular rhythm, normal heart sounds and intact distal pulses.  Exam reveals no gallop.   No murmur heard. Pulmonary/Chest: Effort normal and breath sounds normal. No respiratory distress. She has no wheezes. She has no rales. She exhibits no tenderness.  Abdominal: Soft. Bowel sounds are normal. She exhibits no distension and no mass. There is no tenderness.  Musculoskeletal: Normal range of motion. She exhibits no edema or tenderness.  Neurological: She is alert and oriented to person, place, and time. She has normal reflexes.  Skin: Skin is warm and dry. She is not diaphoretic.  Psychiatric: She has a normal mood and affect.     Assessment & Plan:   1. Type 1 diabetes mellitus with stage 3 chronic kidney disease ([place]) Uncontrolled with A1c of 13.0 due to  noncompliance She has been out of Tujeo for 2 weeks which I have refills and she will continue to NovoLog sliding scale. Blood sugar level reviewed at her next office visit - Glucose (CBG)  2. Hyperglycemia Presence of ketonuria, placed on IV fluids - Urinalysis Dipstick - insulin aspart (novoLOG) injection 10 Units; Inject 0.1 mLs (10 Units total) into the skin once. - 0.9 %  sodium chloride infusion; Inject into the vein continuous. - Glucose (CBG)  3. Diabetic peripheral neuropathy associated with type 1 diabetes mellitus (HCC) Continue gabapentin  4. CKD stage G3a/A3, GFR 45 - 59 and albumin creatinine ratio >300 mg/g Med list indicates she is on Lasix even though there is no clear indication and I have advised her to hold off on this and she sees the nephrologist in 2 months. Patient is being [facility]  5. Non compliance with medical treatment Implications of noncompliance discussed with the patient and she fully understands and is  willing to work towards better compliance  6. Diabetic gastroparesis associated with type 1 diabetes mellitus ([place]) Placed on Reglan and hopefully this should help with her symptoms - metoCLOPramide (REGLAN) 5 MG tablet; Take 1 tablet (5 mg total) by mouth 4 (four) times daily -  before meals and at bedtime.  Dispense: 120 tablet; Refill: 2 - pantoprazole (PROTONIX) 40 MG tablet; Take 1 tablet (40 mg total) by mouth daily before supper.  Dispense: 30 tablet; Refill: 1  7. Type 1 diabetes, uncontrolled, with peripheral circulatory disorder (HCC) - insulin aspart (NOVOLOG) 100 UNIT/ML injection; Inject 15 Units into the skin 3 (three) times daily with meals. 0-15 Units, Subcutaneous, 3 times daily with meals CBG < 70: Call MD CBG 70 - 120: 0 units CBG 121 - 150: 2 units CBG 151 - 200: 3 units CBG 201 - 250: 5 units CBG 251 - 300: 8 units CBG 301 - 350: 11 units CBG 351 - 400: 15 units CBG > 400: call MD  Dispense: 10 mL;  Refill: 1 - Insulin Glargine (TOUJEO SOLOSTAR) 300 UNIT/ML SOPN; Inject 30 Units into the skin at bedtime.  Dispense: 1.5 mL; Refill: 3  8. Other specified hypothyroidism Poor control due to noncompliance with there was [place] - levothyroxine (SYNTHROID, LEVOTHROID) 175 MCG tablet; Take 1 tablet (175 mcg total) by mouth daily before breakfast.  Dispense: 30 tablet; Refill: 2  9. Essential hypertension Controlled - amLODipine (NORVASC) 10 MG tablet; Take 1 tablet (10 mg total) by mouth daily.  Dispense: 30 tablet; Refill: 2  10. Hyperlipidemia She has not been taking her pravastatin regularly and have refilled this today. - pravastatin (PRAVACHOL) 20 MG tablet; Take 1 tablet (20 mg total) by mouth every evening.  Dispense: 30 tablet; Refill: 3   Meds ordered this encounter  Medications  . insulin aspart (novoLOG) injection 10 Units    Sig:   . 0.9 %  sodium chloride infusion    Sig:   . amLODipine (NORVASC) 10 MG tablet    Sig: Take 1 tablet (10 mg total) by mouth daily.    Dispense:  30 tablet    Refill:  2  . metoCLOPramide (REGLAN) 5 MG tablet    Sig: Take 1 tablet (5 mg total) by mouth 4 (four) times daily -  before meals and at bedtime.    Dispense:  120 tablet    Refill:  2  . pravastatin (PRAVACHOL) 20 MG tablet    Sig: Take 1 tablet (20 mg total) by mouth every evening.    Dispense:  30 tablet    Refill:  3  . insulin aspart (NOVOLOG) 100 UNIT/ML injection    Sig: Inject 15 Units into the skin 3 (three) times daily with meals. 0-15 Units, Subcutaneous, 3 times daily with meals CBG < 70: Call MD CBG 70 - 120: 0 units CBG 121 - 150: 2 units CBG 151 - 200: 3 units CBG 201 - 250: 5 units CBG 251 - 300: 8 units CBG 301 - 350: 11 units CBG 351 - 400: 15 units CBG > 400: call MD    Dispense:  10 mL    Refill:  1  . levothyroxine (SYNTHROID, LEVOTHROID) 175 MCG tablet    Sig: Take 1 tablet (175 mcg total) by mouth daily before breakfast.    Dispense:  30 tablet     Refill:  2  . pantoprazole (PROTONIX) 40 MG tablet    Sig: Take 1 tablet (40 mg total) by mouth  daily before supper.    Dispense:  30 tablet    Refill:  1  . Insulin Glargine (TOUJEO SOLOSTAR) 300 UNIT/ML SOPN    Sig: Inject 30 Units into the skin at bedtime.    Dispense:  1.5 mL    Refill:  3    Follow-up: Return in about 1 week (around 01/12/2016) for transitional care clinic-follow-up on diabetes mellitus.   Arnoldo Morale MD

## 2016-01-11 ENCOUNTER — Other Ambulatory Visit: Payer: Self-pay | Admitting: *Deleted

## 2016-01-11 ENCOUNTER — Encounter: Payer: Self-pay | Admitting: *Deleted

## 2016-01-11 ENCOUNTER — Telehealth: Payer: Self-pay

## 2016-01-11 DIAGNOSIS — IMO0002 Reserved for concepts with insufficient information to code with codable children: Secondary | ICD-10-CM

## 2016-01-11 DIAGNOSIS — E1065 Type 1 diabetes mellitus with hyperglycemia: Principal | ICD-10-CM

## 2016-01-11 DIAGNOSIS — E1051 Type 1 diabetes mellitus with diabetic peripheral angiopathy without gangrene: Secondary | ICD-10-CM

## 2016-01-11 MED ORDER — ALBUTEROL SULFATE HFA 108 (90 BASE) MCG/ACT IN AERS: 1.0000 | INHALATION_SPRAY | Freq: Four times a day (QID) | RESPIRATORY_TRACT | Status: DC | PRN

## 2016-01-11 NOTE — Telephone Encounter (Signed)
error 

## 2016-01-11 NOTE — Telephone Encounter (Signed)
Attempted to contact the patient to remind her of her appointment at the [facility] tomorrow, 01/12/16 @ 1430.  Call placed to # [phone] (M) and a HIPAA compliant voice mail message was left requesting a call back to # [phone] or [phone].

## 2016-01-12 ENCOUNTER — Ambulatory Visit: Payer: Self-pay | Attending: Family Medicine | Admitting: Family Medicine

## 2016-01-12 ENCOUNTER — Encounter: Payer: Self-pay | Admitting: Family Medicine

## 2016-01-12 VITALS — BP 145/96 | HR 96 | Temp 98.1°F | Resp 15 | Ht 65.0 in | Wt 145.6 lb

## 2016-01-12 DIAGNOSIS — E108 Type 1 diabetes mellitus with unspecified complications: Secondary | ICD-10-CM

## 2016-01-12 DIAGNOSIS — K2100 Gastro-esophageal reflux disease with esophagitis, without bleeding: Secondary | ICD-10-CM

## 2016-01-12 DIAGNOSIS — IMO0002 Reserved for concepts with insufficient information to code with codable children: Secondary | ICD-10-CM

## 2016-01-12 DIAGNOSIS — R739 Hyperglycemia, unspecified: Secondary | ICD-10-CM

## 2016-01-12 DIAGNOSIS — G4700 Insomnia, unspecified: Secondary | ICD-10-CM

## 2016-01-12 DIAGNOSIS — E1065 Type 1 diabetes mellitus with hyperglycemia: Secondary | ICD-10-CM

## 2016-01-12 DIAGNOSIS — E038 Other specified hypothyroidism: Secondary | ICD-10-CM

## 2016-01-12 DIAGNOSIS — I10 Essential (primary) hypertension: Secondary | ICD-10-CM

## 2016-01-12 DIAGNOSIS — E034 Atrophy of thyroid (acquired): Secondary | ICD-10-CM

## 2016-01-12 DIAGNOSIS — E1051 Type 1 diabetes mellitus with diabetic peripheral angiopathy without gangrene: Secondary | ICD-10-CM

## 2016-01-12 LAB — POCT URINALYSIS DIPSTICK
Bilirubin, UA: NEGATIVE
Glucose, UA: 500
KETONES UA: NEGATIVE
LEUKOCYTES UA: NEGATIVE
NITRITE UA: NEGATIVE
PROTEIN UA: 100
Spec Grav, UA: 1.015
Urobilinogen, UA: 0.2
pH, UA: 6.5

## 2016-01-12 LAB — GLUCOSE, POCT (MANUAL RESULT ENTRY)
POC GLUCOSE: 231 mg/dL — AB (ref 70–99)
POC Glucose: 433 mg/dl — AB (ref 70–99)

## 2016-01-12 LAB — POCT GLYCOSYLATED HEMOGLOBIN (HGB A1C): Hemoglobin A1C: 11.1

## 2016-01-12 MED ORDER — INSULIN GLARGINE 300 UNIT/ML ~~LOC~~ SOPN: 35.0000 [IU] | PEN_INJECTOR | Freq: Every day | SUBCUTANEOUS | Status: DC

## 2016-01-12 MED ORDER — TRAZODONE HCL 100 MG PO TABS: 100.0000 mg | ORAL_TABLET | Freq: Every evening | ORAL | Status: DC | PRN

## 2016-01-12 MED ORDER — INSULIN ASPART 100 UNIT/ML ~~LOC~~ SOLN: 10.0000 [IU] | Freq: Once | SUBCUTANEOUS | Status: DC

## 2016-01-12 MED FILL — traZODone HCL 100 MG TABS: 100 | 30 days supply | Qty: 30 | Fill #0

## 2016-01-12 NOTE — Patient Instructions (Signed)
Diabetes Mellitus and Food It is important for you to manage your blood sugar (glucose) level. Your blood glucose level can be greatly affected by what you eat. Eating healthier foods in the appropriate amounts throughout the day at about the same time each day will help you control your blood glucose level. It can also help slow or prevent worsening of your diabetes mellitus. Healthy eating may even help you improve the level of your blood pressure and reach or maintain a healthy weight.  General recommendations for healthful eating and cooking habits include:  Eating meals and snacks regularly. Avoid going long periods of time without eating to lose weight.  Eating a diet that consists mainly of plant-based foods, such as fruits, vegetables, nuts, legumes, and whole grains.  Using low-heat cooking methods, such as baking, instead of high-heat cooking methods, such as deep frying. Work with your dietitian to make sure you understand how to use the Nutrition Facts information on food labels. HOW CAN FOOD AFFECT ME? Carbohydrates Carbohydrates affect your blood glucose level more than any other type of food. Your dietitian will help you determine how many carbohydrates to eat at each meal and teach you how to count carbohydrates. Counting carbohydrates is important to keep your blood glucose at a healthy level, especially if you are using insulin or taking certain medicines for diabetes mellitus. Alcohol Alcohol can cause sudden decreases in blood glucose (hypoglycemia), especially if you use insulin or take certain medicines for diabetes mellitus. Hypoglycemia can be a life-threatening condition. Symptoms of hypoglycemia (sleepiness, dizziness, and disorientation) are similar to symptoms of having too much alcohol.  If your health care provider has given you approval to drink alcohol, do so in moderation and use the following guidelines:  Women should not have more than one drink per day, and men  should not have more than two drinks per day. One drink is equal to:  12 oz of beer.  5 oz of wine.  1 oz of hard liquor.  Do not drink on an empty stomach.  Keep yourself hydrated. Have water, diet soda, or unsweetened iced tea.  Regular soda, juice, and other mixers might contain a lot of carbohydrates and should be counted. WHAT FOODS ARE NOT RECOMMENDED? As you make food choices, it is important to remember that all foods are not the same. Some foods have fewer nutrients per serving than other foods, even though they might have the same number of calories or carbohydrates. It is difficult to get your body what it needs when you eat foods with fewer nutrients. Examples of foods that you should avoid that are high in calories and carbohydrates but low in nutrients include:  Trans fats (most processed foods list trans fats on the Nutrition Facts label).  Regular soda.  Juice.  Candy.  Sweets, such as cake, pie, doughnuts, and cookies.  Fried foods. WHAT FOODS CAN I EAT? Eat nutrient-rich foods, which will nourish your body and keep you healthy. The food you should eat also will depend on several factors, including:  The calories you need.  The medicines you take.  Your weight.  Your blood glucose level.  Your blood pressure level.  Your cholesterol level. You should eat a variety of foods, including:  Protein.  Lean cuts of meat.  Proteins low in saturated fats, such as fish, egg whites, and beans. Avoid processed meats.  Fruits and vegetables.  Fruits and vegetables that may help control blood glucose levels, such as apples, mangoes, and   yams.  Dairy products.  Choose fat-free or low-fat dairy products, such as milk, yogurt, and cheese.  Grains, bread, pasta, and rice.  Choose whole grain products, such as multigrain bread, whole oats, and brown rice. These foods may help control blood pressure.  Fats.  Foods containing healthful fats, such as nuts,  avocado, olive oil, canola oil, and fish. DOES EVERYONE WITH DIABETES MELLITUS HAVE THE SAME MEAL PLAN? Because every person with diabetes mellitus is different, there is not one meal plan that works for everyone. It is very important that you meet with a dietitian who will help you create a meal plan that is just right for you.   This information is not intended to replace advice given to you by your health care provider. Make sure you discuss any questions you have with your health care provider.   Document Released: 09/01/2005 Document Revised: 12/26/2014 Document Reviewed: 11/01/2013 Elsevier Interactive Patient Education 2016 [employer]  

## 2016-01-12 NOTE — Progress Notes (Signed)
Subjective:  Patient ID: Angela [name], female    DOB: 04-03-1986  Age: 30 y.o. MRN: [id number]  CC: Diabetes   HPI Angela [name] is a  30 year old female with a history of type 1 diabetes mellitus (A1c 11.1), diabetic neuropathy, hyperlipidemia, stage III chronic kidney disease, hypertension, hypothyroidism, noncompliance comes to the clinic for follow-up visit after hospitalization at [facility] from 12/09/15-12/11/15 for hyperglycemia due to type 1 diabetes mellitus.  She had admitted to being noncompliant with her basal insulin at her last office visit and reports being more compliant today. Blood sugar log reviewed reveals fasting sugars of 112-201 and random blood sugars of 194-301. CBG is 433 and the clinic today and she admits to recently ingesting some candy.  She complains of epigastric pain for the last 4 days associated with urinating or moving her bowels. She does have nausea which is unchanged from her baseline; denies dysuria or urinary frequency. Currently on a proton pump inhibitor and admits to compliance with this but she also admits to insomnia and having to smoke a lot of Marijuana to get her to sleep which in return causes her to eat late into the night.    Outpatient Prescriptions Prior to Visit  Medication Sig Dispense Refill  . albuterol (PROVENTIL HFA;VENTOLIN HFA) 108 (90 Base) MCG/ACT inhaler Inhale 1-2 puffs into the lungs every 6 (six) hours as needed for wheezing or shortness of breath. 54 g 3  . amLODipine (NORVASC) 10 MG tablet Take 1 tablet (10 mg total) by mouth daily. 30 tablet 2  . aspirin EC 81 MG tablet Take 1 tablet (81 mg total) by mouth daily.    . busPIRone (BUSPAR) 7.5 MG tablet Take 1 tablet (7.5 mg total) by mouth 2 (two) times daily with a meal. 60 tablet 2  . ferrous sulfate 325 (65 FE) MG tablet Take 1 tablet (325 mg total) by mouth 3 (three) times daily with meals. 90 tablet 3  . furosemide (LASIX) 40 MG tablet Take 1 tablet (40 mg total)  by mouth daily. 90 tablet 1  . gabapentin (NEURONTIN) 300 MG capsule Take 2 capsules (600 mg total) by mouth at bedtime. 60 capsule 11  . insulin aspart (NOVOLOG) 100 UNIT/ML injection Inject 15 Units into the skin 3 (three) times daily with meals. 0-15 Units, Subcutaneous, 3 times daily with meals CBG < 70: Call MD CBG 70 - 120: 0 units CBG 121 - 150: 2 units CBG 151 - 200: 3 units CBG 201 - 250: 5 units CBG 251 - 300: 8 units CBG 301 - 350: 11 units CBG 351 - 400: 15 units CBG > 400: call MD 10 mL 1  . levothyroxine (SYNTHROID, LEVOTHROID) 175 MCG tablet Take 1 tablet (175 mcg total) by mouth daily before breakfast. 30 tablet 2  . metoCLOPramide (REGLAN) 5 MG tablet Take 1 tablet (5 mg total) by mouth 4 (four) times daily -  before meals and at bedtime. 120 tablet 2  . pantoprazole (PROTONIX) 40 MG tablet Take 1 tablet (40 mg total) by mouth daily before supper. 30 tablet 1  . pravastatin (PRAVACHOL) 20 MG tablet Take 1 tablet (20 mg total) by mouth every evening. 30 tablet 3  . vitamin B-12 (CYANOCOBALAMIN) 250 MCG tablet Take 1 tablet (250 mcg total) by mouth daily. 14 tablet 0  . albuterol (PROVENTIL HFA;VENTOLIN HFA) 108 (90 BASE) MCG/ACT inhaler Inhale 1-2 puffs into the lungs every 6 (six) hours as needed for wheezing or shortness of breath. 3.7  g 12  . Insulin Glargine (TOUJEO SOLOSTAR) 300 UNIT/ML SOPN Inject 30 Units into the skin at bedtime. 1.5 mL 3   Facility-Administered Medications Prior to Visit  Medication Dose Route Frequency Provider Last Rate Last Dose  . 0.9 %  sodium chloride infusion   Intravenous Continuous Arnoldo Morale, MD 150 mL/hr at 01/05/16 1520 1,000 mL at 01/05/16 1520    ROS Review of Systems  Constitutional: Negative for activity change, appetite change and fatigue.  HENT: Negative for congestion, sinus pressure and sore throat.   Eyes: Negative for visual disturbance.  Respiratory: Negative for cough, chest tightness, shortness of breath and wheezing.    Cardiovascular: Negative for chest pain and palpitations.  Gastrointestinal: Positive for abdominal pain. Negative for constipation and abdominal distention.  Endocrine: Negative for polydipsia.  Genitourinary: Negative for dysuria and frequency.  Musculoskeletal: Negative for back pain and arthralgias.  Skin: Negative for rash.  Neurological: Negative for tremors, light-headedness and numbness.  Hematological: Does not bruise/bleed easily.  Psychiatric/Behavioral: Negative for behavioral problems and agitation.    Objective:  BP 145/96 mmHg  Pulse 96  Temp(Src) 98.1 F (36.7 C)  Resp 15  Ht 5\' 5"  (1.651 m)  Wt 145 lb 9.6 oz (66.044 kg)  BMI 24.23 kg/m2  SpO2 98%  BP/Weight 01/12/2016 01/05/2016 123XX123  Systolic BP Q000111Q Q000111Q 123XX123  Diastolic BP 96 85 89  Wt. (Lbs) 145.6 145.8 -  BMI 24.23 24.26 -    Lab Results  Component Value Date   HGBA1C 11.10 01/12/2016     Physical Exam  Constitutional: She is oriented to person, place, and time. She appears well-developed and well-nourished.  Cardiovascular: Normal rate, normal heart sounds and intact distal pulses.   No murmur heard. Pulmonary/Chest: Effort normal and breath sounds normal. She has no wheezes. She has no rales. She exhibits no tenderness.  Abdominal: Soft. Bowel sounds are normal. She exhibits no distension and no mass. There is tenderness (epigastric).  Musculoskeletal: Normal range of motion.  Neurological: She is alert and oriented to person, place, and time.     Assessment & Plan:   1. Type I diabetes mellitus with complication, uncontrolled (HCC) A1c trended down from 13.0 to11.1 Tujeo increased to 35 units at bedtime based on blood sugar log. - HgB A1c - Glucose (CBG) - insulin aspart (novoLOG) injection 10 Units; Inject 0.1 mLs (10 Units total) into the skin once.  2. Hyperglycemia NovoLog 10 units administered and patient observed for 30 minutes prior to repeat her blood sugar; repeat CBG was  231 - Urinalysis Dipstick-negative for ketones  3. Essential hypertension Blood pressure is above goal of less than 140/90. Continue antihypertensives and low-sodium, DASH diet  4. Gastroesophageal reflux disease with esophagitis Currently on protein pump inhibitor. Current epigastric pain could indicate poor control; she has been advised to avoid late meals  5. Insomnia Trial of trazodone, sleep hygiene recommended. - traZODone (DESYREL) 100 MG tablet; Take 1 tablet (100 mg total) by mouth at bedtime as needed for sleep.  Dispense: 30 tablet; Refill: 1  6. Type 1 diabetes, uncontrolled, with peripheral circulatory disorder (HCC) - Insulin Glargine (TOUJEO SOLOSTAR) 300 UNIT/ML SOPN; Inject 35 Units into the skin at bedtime.  Dispense: 1.5 mL; Refill: 3  7. Hypothyroidism due to acquired atrophy of thyroid Uncontrolled due to noncompliance. Compliance is emphasized and she will need a repeat TSH at her next office visit   Meds ordered this encounter  Medications  . traZODone (DESYREL) 100 MG tablet  Sig: Take 1 tablet (100 mg total) by mouth at bedtime as needed for sleep.    Dispense:  30 tablet    Refill:  1  . Insulin Glargine (TOUJEO SOLOSTAR) 300 UNIT/ML SOPN    Sig: Inject 35 Units into the skin at bedtime.    Dispense:  1.5 mL    Refill:  3    Discontinue previous dose  . insulin aspart (novoLOG) injection 10 Units    Sig:     Follow-up: Return in about 3 weeks (around 02/02/2016) for For follow-up of diabetes mellitus with PCP-Dr Funches.   Arnoldo Morale MD

## 2016-01-12 NOTE — Progress Notes (Signed)
Patient here to follow up on her diabetes Her sugar was 422 this afternoon She states she has been taking her medications as directed She took 10 units of insulin an hour before she came but drank orange juice and ate candy

## 2016-01-27 ENCOUNTER — Other Ambulatory Visit: Payer: Self-pay | Admitting: Family Medicine

## 2016-01-27 DIAGNOSIS — IMO0002 Reserved for concepts with insufficient information to code with codable children: Secondary | ICD-10-CM

## 2016-01-27 DIAGNOSIS — E1065 Type 1 diabetes mellitus with hyperglycemia: Principal | ICD-10-CM

## 2016-01-27 DIAGNOSIS — E1051 Type 1 diabetes mellitus with diabetic peripheral angiopathy without gangrene: Secondary | ICD-10-CM

## 2016-01-27 MED ORDER — INSULIN GLARGINE 300 UNIT/ML ~~LOC~~ SOPN: 35.0000 [IU] | PEN_INJECTOR | Freq: Every day | SUBCUTANEOUS | Status: DC

## 2016-02-02 ENCOUNTER — Ambulatory Visit: Payer: Self-pay | Admitting: Family Medicine

## 2016-02-04 ENCOUNTER — Other Ambulatory Visit: Payer: Self-pay

## 2016-02-04 ENCOUNTER — Encounter: Payer: Self-pay | Admitting: Family Medicine

## 2016-02-04 ENCOUNTER — Ambulatory Visit (HOSPITAL_BASED_OUTPATIENT_CLINIC_OR_DEPARTMENT_OTHER): Payer: Self-pay | Admitting: Clinical

## 2016-02-04 ENCOUNTER — Ambulatory Visit: Payer: Self-pay | Attending: Family Medicine | Admitting: Family Medicine

## 2016-02-04 VITALS — BP 145/94 | HR 94 | Temp 98.0°F | Resp 16 | Ht 65.0 in

## 2016-02-04 DIAGNOSIS — E039 Hypothyroidism, unspecified: Secondary | ICD-10-CM | POA: Insufficient documentation

## 2016-02-04 DIAGNOSIS — Z794 Long term (current) use of insulin: Secondary | ICD-10-CM | POA: Insufficient documentation

## 2016-02-04 DIAGNOSIS — Z87891 Personal history of nicotine dependence: Secondary | ICD-10-CM | POA: Insufficient documentation

## 2016-02-04 DIAGNOSIS — R55 Syncope and collapse: Secondary | ICD-10-CM | POA: Insufficient documentation

## 2016-02-04 DIAGNOSIS — I10 Essential (primary) hypertension: Secondary | ICD-10-CM | POA: Insufficient documentation

## 2016-02-04 DIAGNOSIS — Z7982 Long term (current) use of aspirin: Secondary | ICD-10-CM | POA: Insufficient documentation

## 2016-02-04 DIAGNOSIS — Z79899 Other long term (current) drug therapy: Secondary | ICD-10-CM | POA: Insufficient documentation

## 2016-02-04 DIAGNOSIS — F418 Other specified anxiety disorders: Secondary | ICD-10-CM

## 2016-02-04 DIAGNOSIS — E559 Vitamin D deficiency, unspecified: Secondary | ICD-10-CM

## 2016-02-04 DIAGNOSIS — K047 Periapical abscess without sinus: Secondary | ICD-10-CM

## 2016-02-04 DIAGNOSIS — K029 Dental caries, unspecified: Secondary | ICD-10-CM | POA: Insufficient documentation

## 2016-02-04 DIAGNOSIS — E1022 Type 1 diabetes mellitus with diabetic chronic kidney disease: Secondary | ICD-10-CM | POA: Insufficient documentation

## 2016-02-04 DIAGNOSIS — E1029 Type 1 diabetes mellitus with other diabetic kidney complication: Secondary | ICD-10-CM

## 2016-02-04 DIAGNOSIS — R0602 Shortness of breath: Secondary | ICD-10-CM | POA: Insufficient documentation

## 2016-02-04 DIAGNOSIS — R0789 Other chest pain: Secondary | ICD-10-CM | POA: Insufficient documentation

## 2016-02-04 LAB — POCT URINALYSIS DIPSTICK
Bilirubin, UA: NEGATIVE
Glucose, UA: 500
Ketones, UA: NEGATIVE
LEUKOCYTES UA: NEGATIVE
NITRITE UA: NEGATIVE
PH UA: 6
Spec Grav, UA: 1.02
UROBILINOGEN UA: 0.2

## 2016-02-04 LAB — GLUCOSE, POCT (MANUAL RESULT ENTRY): POC GLUCOSE: 417 mg/dL — AB (ref 70–99)

## 2016-02-04 LAB — POCT GLYCOSYLATED HEMOGLOBIN (HGB A1C): HEMOGLOBIN A1C: 10.1

## 2016-02-04 MED ORDER — INSULIN GLARGINE 300 UNIT/ML ~~LOC~~ SOPN: 40.0000 [IU] | PEN_INJECTOR | Freq: Every day | SUBCUTANEOUS | Status: DC

## 2016-02-04 MED ORDER — AMOXICILLIN 500 MG PO CAPS: 500.0000 mg | ORAL_CAPSULE | Freq: Three times a day (TID) | ORAL | Status: DC

## 2016-02-04 MED ORDER — AMLODIPINE BESYLATE 5 MG PO TABS: 5.0000 mg | ORAL_TABLET | Freq: Every day | ORAL | Status: DC

## 2016-02-04 NOTE — Progress Notes (Signed)
F/U DM  Glucose elevated today  Took Novolog 10 unit at 3:00 Had ice cream. At 2:50 Tooth ache , Pain scale #7 No tobacco user  No suicidal thought in the past two weeks

## 2016-02-04 NOTE — Progress Notes (Signed)
Subjective:  Patient ID: Angela [name], female    DOB: 1986/01/10  Age: 30 y.o. MRN: [id number]  CC: Diabetes   HPI Angela [name] presents has IDDM type 1, uncontrolled, hypothyroidism   1. CHRONIC DIABETES  Disease Monitoring  Blood Sugar Ranges: [id number], post prandial [phone]  Polyuria: no   Visual problems: yes   Medication Compliance: yes  Medication Side Effects  Hypoglycemia: no   2. Syncope: 2 days ago. She had two syncopal episodes. Associated with chest pressure and SOB. She was feeling cold. She was standing in the kitchen. Witnessed syncope. She did not go to ED or call EMS. She denies head trauma and injury. She denies CP or SOB now. She denies blood in stool or heavy menses.   3. Hypothyroidism: taking synthroid. Misses doses at times. No swelling. Has feels cold and has fatigue.   4. Dental pain: has painful left low molar. There is a large cavity in this tooth. No fever or chills.   Social History  Substance Use Topics  . Smoking status: Former Smoker -- 0.25 packs/day for 2 years    Types: Cigarettes    Quit date: 03/04/2013  . Smokeless tobacco: Never Used  . Alcohol Use: No    Outpatient Prescriptions Prior to Visit  Medication Sig Dispense Refill  . albuterol (PROVENTIL HFA;VENTOLIN HFA) 108 (90 Base) MCG/ACT inhaler Inhale 1-2 puffs into the lungs every 6 (six) hours as needed for wheezing or shortness of breath. 54 g 3  . amLODipine (NORVASC) 10 MG tablet Take 1 tablet (10 mg total) by mouth daily. 30 tablet 2  . aspirin EC 81 MG tablet Take 1 tablet (81 mg total) by mouth daily.    . busPIRone (BUSPAR) 7.5 MG tablet Take 1 tablet (7.5 mg total) by mouth 2 (two) times daily with a meal. 60 tablet 2  . ferrous sulfate 325 (65 FE) MG tablet Take 1 tablet (325 mg total) by mouth 3 (three) times daily with meals. 90 tablet 3  . furosemide (LASIX) 40 MG tablet Take 1 tablet (40 mg total) by mouth daily. 90 tablet 1  . gabapentin (NEURONTIN) 300 MG  capsule Take 2 capsules (600 mg total) by mouth at bedtime. 60 capsule 11  . insulin aspart (NOVOLOG) 100 UNIT/ML injection Inject 15 Units into the skin 3 (three) times daily with meals. 0-15 Units, Subcutaneous, 3 times daily with meals CBG < 70: Call MD CBG 70 - 120: 0 units CBG 121 - 150: 2 units CBG 151 - 200: 3 units CBG 201 - 250: 5 units CBG 251 - 300: 8 units CBG 301 - 350: 11 units CBG 351 - 400: 15 units CBG > 400: call MD 10 mL 1  . Insulin Glargine (TOUJEO SOLOSTAR) 300 UNIT/ML SOPN Inject 35 Units into the skin at bedtime. 6 mL 3  . levothyroxine (SYNTHROID, LEVOTHROID) 175 MCG tablet Take 1 tablet (175 mcg total) by mouth daily before breakfast. 30 tablet 2  . metoCLOPramide (REGLAN) 5 MG tablet Take 1 tablet (5 mg total) by mouth 4 (four) times daily -  before meals and at bedtime. 120 tablet 2  . pantoprazole (PROTONIX) 40 MG tablet Take 1 tablet (40 mg total) by mouth daily before supper. 30 tablet 1  . pravastatin (PRAVACHOL) 20 MG tablet Take 1 tablet (20 mg total) by mouth every evening. 30 tablet 3  . traZODone (DESYREL) 100 MG tablet Take 1 tablet (100 mg total) by mouth at bedtime as needed for sleep.  30 tablet 1  . vitamin B-12 (CYANOCOBALAMIN) 250 MCG tablet Take 1 tablet (250 mcg total) by mouth daily. 14 tablet 0   Facility-Administered Medications Prior to Visit  Medication Dose Route Frequency Provider Last Rate Last Dose  . 0.9 %  sodium chloride infusion   Intravenous Continuous Arnoldo Morale, MD 150 mL/hr at 01/05/16 1520 1,000 mL at 01/05/16 1520  . insulin aspart (novoLOG) injection 10 Units  10 Units Subcutaneous Once Arnoldo Morale, MD        ROS Review of Systems  Constitutional: Negative for fever, chills, diaphoresis, activity change, appetite change, fatigue and unexpected weight change.  HENT: Positive for dental problem. Negative for congestion, sinus pressure and sore throat.   Eyes: Negative for visual disturbance.  Respiratory: Positive for  shortness of breath. Negative for cough, chest tightness and wheezing.   Cardiovascular: Positive for chest pain. Negative for palpitations.  Gastrointestinal: Negative for abdominal pain, constipation, blood in stool, abdominal distention and anal bleeding.  Endocrine: Negative for polydipsia.  Genitourinary: Negative for dysuria, frequency, vaginal bleeding and menstrual problem.  Musculoskeletal: Negative for back pain and arthralgias.  Skin: Negative for rash.  Neurological: Positive for syncope and light-headedness. Negative for tremors and numbness.  Hematological: Does not bruise/bleed easily.  Psychiatric/Behavioral: Negative for behavioral problems and agitation.    Objective:  BP 145/94 mmHg  Pulse 94  Temp(Src) 98 F (36.7 C) (Oral)  Resp 16  Ht 5\' 5"  (1.651 m)  SpO2 100%  LMP 01/27/2016  BP/Weight 02/04/2016 01/12/2016 [id number]  Systolic BP Q000111Q Q000111Q Q000111Q  Diastolic BP 94 96 85  Wt. (Lbs) - 145.6 145.8  BMI - 24.23 24.26   Orthostatic VS for the past 24 hrs:  BP- Lying Pulse- Lying BP- Sitting Pulse- Sitting BP- Standing at 0 minutes Pulse- Standing at 0 minutes  02/04/16 1642 (!) 163/114 mmHg 87 (!) 146/101 mmHg 92 (!) 139/99 mmHg 98      Physical Exam  Constitutional: She is oriented to person, place, and time. She appears well-developed and well-nourished. No distress.  HENT:  Head: Normocephalic and atraumatic.  Mouth/Throat:    Cardiovascular: Normal rate, regular rhythm, normal heart sounds and intact distal pulses.   Pulmonary/Chest: Effort normal and breath sounds normal.  Musculoskeletal: She exhibits no edema.  Neurological: She is alert and oriented to person, place, and time.  Skin: Skin is warm and dry. No rash noted.  Psychiatric: She has a normal mood and affect.    Lab Results  Component Value Date   HGBA1C 10.10 02/04/2016   CBG 417  UA: moderate blood, 500 glucose, negative ketones   EKG: normal EKG, normal sinus rhythm. Depression  screen [facility] 2/9 02/04/2016 01/12/2016 01/05/2016  Decreased Interest 2 1 1   Down, Depressed, Hopeless 2 1 1   PHQ - 2 Score 4 2 2   Altered sleeping 2 3 2   Tired, decreased energy 2 1 2   Change in appetite 2 1 1   Feeling bad or failure about yourself  1 1 1   Trouble concentrating 2 1 1   Moving slowly or fidgety/restless 2 1 1   Suicidal thoughts 2 0 0  PHQ-9 Score 17 10 10    GAD 7 : Generalized Anxiety Score 02/04/2016 01/12/2016 01/05/2016  Nervous, Anxious, on Edge 2 1 2   Control/stop worrying 2 1 1   Worry too much - different things 2 3 2   Trouble relaxing 2 2 2   Restless 2 2 2   Easily annoyed or irritable 2 - 2  Afraid - awful  might happen 1 1 2   Total GAD 7 Score 13 - 13      Assessment & Plan:   Josephina was seen today for diabetes.  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 -     Lipid Panel  Hypothyroidism, unspecified hypothyroidism type -     TSH  Syncope and collapse -     CBC -     Vitamin D, 25-hydroxy -     Brain natriuretic peptide  Infected dental carries -     amoxicillin (AMOXIL) 500 MG capsule; Take 1 capsule (500 mg total) by mouth 3 (three) times daily.  Essential hypertension -     amLODipine (NORVASC) 5 MG tablet; Take 1 tablet (5 mg total) by mouth daily.   Follow-up: No Follow-up on file.   Boykin Nearing MD

## 2016-02-04 NOTE — Assessment & Plan Note (Signed)
A: uncontrolled with slight improvement, hyperglycemia P: Hydration, oral Increase toujeo to 40 U with goal of fasting CBGs 90-110

## 2016-02-04 NOTE — Patient Instructions (Addendum)
Angela [name] was seen today for diabetes.  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 -     Lipid Panel  Hypothyroidism, unspecified hypothyroidism type -     TSH  Syncope and collapse -     CBC -     Vitamin D, 25-hydroxy -     Brain natriuretic peptide  Infected dental carries -     amoxicillin (AMOXIL) 500 MG capsule; Take 1 capsule (500 mg total) by mouth 3 (three) times daily.  Essential hypertension -     amLODipine (NORVASC) 5 MG tablet; Take 1 tablet (5 mg total) by mouth daily.   Please decrease Norvasc from 10 to 5 mg daily to help prevent ortho stasis, drops in BP during position changes. Drink plenty of water to stay well hydrated.   Please apply for [place] discount and orange card.  You are in need of dental and eye doctor referrals.   F/u in 3 weeks with pharmacist for blood sugar check F/u with me in  6 weeks for HTN, orthostasis, recent syncope   Dr. Adrian Blackwater   Syncope Syncope is a medical term for fainting or passing out. This means you lose consciousness and drop to the ground. People are generally unconscious for less than 5 minutes. You may have some muscle twitches for up to 15 seconds before waking up and returning to normal. Syncope occurs more often in older adults, but it can happen to anyone. While most causes of syncope are not dangerous, syncope can be a sign of a serious medical problem. It is important to seek medical care.  CAUSES  Syncope is caused by a sudden drop in blood flow to the brain. The specific cause is often not determined. Factors that can bring on  syncope include:  Taking medicines that lower blood pressure.  Sudden changes in posture, such as standing up quickly.  Taking more medicine than prescribed.  Standing in one place for too long.  Seizure disorders.  Dehydration and excessive exposure to heat.  Low blood sugar (hypoglycemia).  Straining to have a bowel movement.  Heart disease, irregular heartbeat, or other circulatory problems.  Fear, emotional distress, seeing blood, or severe pain. SYMPTOMS  Right before fainting, you may:  Feel dizzy or light-headed.  Feel nauseous.  See all white or all black in your field of vision.  Have cold, clammy skin. DIAGNOSIS  Your health care provider will ask about your symptoms, perform a physical exam, and perform an electrocardiogram (ECG) to record the electrical activity of your heart. Your health care provider may also perform other heart or blood tests to determine the cause of your syncope which may include:  Transthoracic echocardiogram (TTE). During echocardiography, sound waves are used to evaluate how blood flows through your heart.  Transesophageal echocardiogram (TEE).  Cardiac monitoring. This allows your health care provider to monitor your heart rate and rhythm in real time.  Holter monitor. This is a portable device that records your heartbeat and can help diagnose heart arrhythmias. It allows your  health care provider to track your heart activity for several days, if needed.  Stress tests by exercise or by giving medicine that makes the heart beat faster. TREATMENT  In most cases, no treatment is needed. Depending on the cause of your syncope, your health care provider may recommend changing or stopping some of your medicines. HOME CARE INSTRUCTIONS  Have someone stay with you until you feel stable.  Do not drive, use machinery, or play sports until your health care provider says it is okay.  Keep all follow-up appointments as directed by your  health care provider.  Lie down right away if you start feeling like you might faint. Breathe deeply and steadily. Wait until all the symptoms have passed.  Drink enough fluids to keep your urine clear or pale yellow.  If you are taking blood pressure or heart medicine, get up slowly and take several minutes to sit and then stand. This can reduce dizziness. SEEK IMMEDIATE MEDICAL CARE IF:   You have a severe headache.  You have unusual pain in the chest, abdomen, or back.  You are bleeding from your mouth or rectum, or you have black or tarry stool.  You have an irregular or very fast heartbeat.  You have pain with breathing.  You have repeated fainting or seizure-like jerking during an episode.  You faint when sitting or lying down.  You have confusion.  You have trouble walking.  You have severe weakness.  You have vision problems. If you fainted, call your local emergency services (911 in [place] Do not drive yourself to the hospital.    This information is not intended to replace advice given to you by your health care provider. Make sure you discuss any questions you have with your health care provider.   Document Released: 12/05/2005 Document Revised: 04/21/2015 Document Reviewed: 02/03/2012 Elsevier Interactive Patient Education [employer].

## 2016-02-04 NOTE — Assessment & Plan Note (Signed)
A: syncope with orthostatic VS P: Decrease norvasc dose  CBC, vit D, BNP,  Close f/u

## 2016-02-04 NOTE — Progress Notes (Signed)
ASSESSMENT: Pt currently experiencing symptoms of anxiety and depression. Pt needs to f/u with PCP and [facility]; would benefit from motivational interviewing to cope with symptoms of anxiety and depression. Stage of Change: determined  PLAN: 1. F/U with behavioral health consultant in one month 2. Psychiatric Medications: Buspar. 3. Behavioral recommendation(s):   -Consider go to [place] or [facility] walk in -Continue doing daily relaxation breathing exercises SUBJECTIVE: Pt. referred by Dr Adrian Blackwater for symptoms of anxiety and depression:  Pt. reports the following symptoms/concerns: Pt states that she is feeling better since last [facility], and is doing relaxation breathing exercises routinely now. She has been seriously thinking about getting some additional help with changing behavior with substances, before it becomes a problem. Duration of problem: Decrease in sympoms in past 6 months Severity: moderate  OBJECTIVE: Orientation & Cognition: Oriented x3. Thought processes normal and appropriate to situation. Mood: appropriate. Affect: appropriate Appearance: appropriate Risk of harm to self or others: no known risk of harm to self or others Substance use: marijuana Assessments administered: PHQ9: 17/ GAD7: 13 (was 22/21 in 07-27-2015)  Diagnosis: Anxiety and depression CPT Code: F41.8 -------------------------------------------- Other(s) present in the room: none  Time spent with patient in exam room: 16 minutes, 5:00-5:16pm   Depression screen [facility] 2/9 02/04/2016 01/12/2016 01/05/2016 07/28/2015 06/17/2015  Decreased Interest 2 1 1 1  0  Down, Depressed, Hopeless 2 1 1 3  0  PHQ - 2 Score 4 2 2 4  0  Altered sleeping 2 3 2 2  -  Tired, decreased energy 2 1 2 2  -  Change in appetite 2 1 1 2  -  Feeling bad or failure about yourself  1 1 1 1  -  Trouble concentrating 2 1 1  0 -  Moving slowly or fidgety/restless 2 1 1 1  -  Suicidal thoughts 2 0 0 1 -  PHQ-9 Score 17 10 10 13  -    GAD 7 : Generalized Anxiety Score 02/04/2016 01/12/2016 01/05/2016  Nervous, Anxious, on Edge 2 1 2   Control/stop worrying 2 1 1   Worry too much - different things 2 3 2   Trouble relaxing 2 2 2   Restless 2 2 2   Easily annoyed or irritable 2 - 2  Afraid - awful might happen 1 1 2   Total GAD 7 Score 13 - 13

## 2016-02-05 LAB — COMPLETE METABOLIC PANEL WITH GFR
ALT: 12 U/L (ref 6–29)
AST: 17 U/L (ref 10–30)
Albumin: 3.8 g/dL (ref 3.6–5.1)
Alkaline Phosphatase: 78 U/L (ref 33–115)
BUN: 25 mg/dL (ref 7–25)
CHLORIDE: 102 mmol/L (ref 98–110)
CO2: 27 mmol/L (ref 20–31)
Calcium: 9.2 mg/dL (ref 8.6–10.2)
Creat: 2.31 mg/dL — ABNORMAL HIGH (ref 0.50–1.10)
GFR, EST AFRICAN AMERICAN: 32 mL/min — AB (ref >=60)
GFR, EST NON AFRICAN AMERICAN: 28 mL/min — AB (ref >=60)
Glucose, Bld: 93 mg/dL (ref 65–99)
POTASSIUM: 4.2 mmol/L (ref 3.5–5.3)
SODIUM: 138 mmol/L (ref 135–146)
Total Bilirubin: 0.3 mg/dL (ref 0.2–1.2)
Total Protein: 6.7 g/dL (ref 6.1–8.1)

## 2016-02-05 LAB — MICROALBUMIN / CREATININE URINE RATIO
Creatinine, Urine: 120 mg/dL (ref 20–320)
MICROALB UR: 110.9 mg/dL
MICROALB/CREAT RATIO: 924 ug/mg{creat} — AB (ref <30)

## 2016-02-05 LAB — CBC
HCT: 26.6 % — ABNORMAL LOW (ref 36.0–46.0)
HEMOGLOBIN: 8.5 g/dL — AB (ref 12.0–15.0)
MCH: 27.6 pg (ref 26.0–34.0)
MCHC: 32 g/dL (ref 30.0–36.0)
MCV: 86.4 fL (ref 78.0–100.0)
MPV: 12 fL (ref 8.6–12.4)
PLATELETS: 198 10*3/uL (ref 150–400)
RBC: 3.08 MIL/uL — ABNORMAL LOW (ref 3.87–5.11)
RDW: 14.5 % (ref 11.5–15.5)
WBC: 5.1 10*3/uL (ref 4.0–10.5)

## 2016-02-05 LAB — LIPID PANEL
Cholesterol: 254 mg/dL — ABNORMAL HIGH (ref 125–200)
HDL: 67 mg/dL (ref >=46)
LDL Cholesterol: 116 mg/dL (ref <130)
Total CHOL/HDL Ratio: 3.8 Ratio (ref <=5.0)
Triglycerides: 355 mg/dL — ABNORMAL HIGH (ref <150)
VLDL: 71 mg/dL — ABNORMAL HIGH (ref <30)

## 2016-02-05 LAB — VITAMIN D 25 HYDROXY (VIT D DEFICIENCY, FRACTURES): VIT D 25 HYDROXY: 11 ng/mL — AB (ref 30–100)

## 2016-02-05 LAB — TSH: TSH: 45.58 m[IU]/L — AB

## 2016-02-05 LAB — BRAIN NATRIURETIC PEPTIDE: BRAIN NATRIURETIC PEPTIDE: 23.3 pg/mL (ref <100)

## 2016-02-07 MED ORDER — VITAMIN D (ERGOCALCIFEROL) 1.25 MG (50000 UNIT) PO CAPS: 50000.0000 [IU] | ORAL_CAPSULE | ORAL | Status: DC

## 2016-02-07 NOTE — Addendum Note (Signed)
Addended by: Boykin Nearing on: 02/07/2016 08:50 PM   Modules accepted: Orders

## 2016-02-19 ENCOUNTER — Encounter (HOSPITAL_COMMUNITY): Payer: Self-pay | Admitting: *Deleted

## 2016-02-19 ENCOUNTER — Other Ambulatory Visit: Payer: Self-pay | Admitting: Family Medicine

## 2016-02-19 ENCOUNTER — Emergency Department (HOSPITAL_COMMUNITY)
Admission: EM | Admit: 2016-02-19 | Discharge: 2016-02-20 | Disposition: A | Discharge status: Home / Self Care | Payer: Self-pay | Attending: Emergency Medicine | Admitting: Emergency Medicine

## 2016-02-19 DIAGNOSIS — Z794 Long term (current) use of insulin: Secondary | ICD-10-CM | POA: Insufficient documentation

## 2016-02-19 DIAGNOSIS — Z87891 Personal history of nicotine dependence: Secondary | ICD-10-CM | POA: Insufficient documentation

## 2016-02-19 DIAGNOSIS — F329 Major depressive disorder, single episode, unspecified: Secondary | ICD-10-CM | POA: Insufficient documentation

## 2016-02-19 DIAGNOSIS — Z79899 Other long term (current) drug therapy: Secondary | ICD-10-CM | POA: Insufficient documentation

## 2016-02-19 DIAGNOSIS — E039 Hypothyroidism, unspecified: Secondary | ICD-10-CM | POA: Insufficient documentation

## 2016-02-19 DIAGNOSIS — G4700 Insomnia, unspecified: Secondary | ICD-10-CM | POA: Insufficient documentation

## 2016-02-19 DIAGNOSIS — N183 Chronic kidney disease, stage 3 (moderate): Secondary | ICD-10-CM | POA: Insufficient documentation

## 2016-02-19 DIAGNOSIS — J3489 Other specified disorders of nose and nasal sinuses: Secondary | ICD-10-CM | POA: Insufficient documentation

## 2016-02-19 DIAGNOSIS — E1029 Type 1 diabetes mellitus with other diabetic kidney complication: Secondary | ICD-10-CM

## 2016-02-19 DIAGNOSIS — R6883 Chills (without fever): Secondary | ICD-10-CM | POA: Insufficient documentation

## 2016-02-19 DIAGNOSIS — E860 Dehydration: Secondary | ICD-10-CM | POA: Insufficient documentation

## 2016-02-19 DIAGNOSIS — E1040 Type 1 diabetes mellitus with diabetic neuropathy, unspecified: Secondary | ICD-10-CM | POA: Insufficient documentation

## 2016-02-19 DIAGNOSIS — E1065 Type 1 diabetes mellitus with hyperglycemia: Secondary | ICD-10-CM | POA: Insufficient documentation

## 2016-02-19 DIAGNOSIS — E785 Hyperlipidemia, unspecified: Secondary | ICD-10-CM | POA: Insufficient documentation

## 2016-02-19 DIAGNOSIS — I129 Hypertensive chronic kidney disease with stage 1 through stage 4 chronic kidney disease, or unspecified chronic kidney disease: Secondary | ICD-10-CM | POA: Insufficient documentation

## 2016-02-19 DIAGNOSIS — R739 Hyperglycemia, unspecified: Secondary | ICD-10-CM

## 2016-02-19 DIAGNOSIS — R079 Chest pain, unspecified: Secondary | ICD-10-CM | POA: Insufficient documentation

## 2016-02-19 DIAGNOSIS — J45901 Unspecified asthma with (acute) exacerbation: Secondary | ICD-10-CM | POA: Insufficient documentation

## 2016-02-19 DIAGNOSIS — R112 Nausea with vomiting, unspecified: Secondary | ICD-10-CM | POA: Insufficient documentation

## 2016-02-19 DIAGNOSIS — R197 Diarrhea, unspecified: Secondary | ICD-10-CM | POA: Insufficient documentation

## 2016-02-19 DIAGNOSIS — D649 Anemia, unspecified: Secondary | ICD-10-CM | POA: Insufficient documentation

## 2016-02-19 DIAGNOSIS — K219 Gastro-esophageal reflux disease without esophagitis: Secondary | ICD-10-CM | POA: Insufficient documentation

## 2016-02-19 LAB — CBG MONITORING, ED: Glucose-Capillary: 542 mg/dL — ABNORMAL HIGH (ref 65–99)

## 2016-02-19 MED ORDER — INSULIN GLARGINE 300 UNIT/ML ~~LOC~~ SOPN: 40.0000 [IU] | PEN_INJECTOR | Freq: Every day | SUBCUTANEOUS | Status: DC

## 2016-02-19 MED FILL — !TOUJEO SOLOSTAR 300 UNITS/: 300/ML | 23 days supply | Qty: 2 | Fill #0

## 2016-02-19 NOTE — ED Notes (Signed)
Pt reports v/d and coughing since Wednesday night. Unsure if fevers. Last took alka selzer cold and flu around 5pm

## 2016-02-19 NOTE — ED Notes (Signed)
Pt glucose is elevated at 542, pt says that her sugars have been "running high"
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
## 2016-02-20 ENCOUNTER — Emergency Department (HOSPITAL_COMMUNITY): Payer: Self-pay

## 2016-02-20 LAB — URINALYSIS, ROUTINE W REFLEX MICROSCOPIC
BILIRUBIN URINE: NEGATIVE
KETONES UR: 15 mg/dL — AB
Leukocytes, UA: NEGATIVE
Nitrite: NEGATIVE
PH: 6 (ref 5.0–8.0)
Protein, ur: 100 mg/dL — AB
SPECIFIC GRAVITY, URINE: 1.021 (ref 1.005–1.030)

## 2016-02-20 LAB — CBG MONITORING, ED
GLUCOSE-CAPILLARY: 462 mg/dL — AB (ref 65–99)
GLUCOSE-CAPILLARY: 403 mg/dL — AB (ref 65–99)
GLUCOSE-CAPILLARY: 298 mg/dL — AB (ref 65–99)
Glucose-Capillary: 574 mg/dL (ref 65–99)
Glucose-Capillary: 264 mg/dL — ABNORMAL HIGH (ref 65–99)

## 2016-02-20 LAB — COMPREHENSIVE METABOLIC PANEL
ALT: 15 U/L (ref 14–54)
AST: 23 U/L (ref 15–41)
Albumin: 3.8 g/dL (ref 3.5–5.0)
Alkaline Phosphatase: 72 U/L (ref 38–126)
Anion gap: 15 (ref 5–15)
BILIRUBIN TOTAL: 1 mg/dL (ref 0.3–1.2)
BUN: 36 mg/dL — AB (ref 6–20)
CHLORIDE: 96 mmol/L — AB (ref 101–111)
CO2: 19 mmol/L — AB (ref 22–32)
Calcium: 9 mg/dL (ref 8.9–10.3)
Creatinine, Ser: 2.66 mg/dL — ABNORMAL HIGH (ref 0.44–1.00)
GFR calc Af Amer: 27 mL/min — ABNORMAL LOW (ref >60)
GFR calc non Af Amer: 23 mL/min — ABNORMAL LOW (ref >60)
GLUCOSE: 596 mg/dL — AB (ref 65–99)
POTASSIUM: 5.4 mmol/L — AB (ref 3.5–5.1)
Sodium: 130 mmol/L — ABNORMAL LOW (ref 135–145)
Total Protein: 6.9 g/dL (ref 6.5–8.1)

## 2016-02-20 LAB — BASIC METABOLIC PANEL
Anion gap: 15 (ref 5–15)
BUN: 33 mg/dL — ABNORMAL HIGH (ref 6–20)
CHLORIDE: 105 mmol/L (ref 101–111)
CO2: 17 mmol/L — ABNORMAL LOW (ref 22–32)
CREATININE: 2.71 mg/dL — AB (ref 0.44–1.00)
Calcium: 8.6 mg/dL — ABNORMAL LOW (ref 8.9–10.3)
GFR, EST AFRICAN AMERICAN: 26 mL/min — AB (ref >60)
GFR, EST NON AFRICAN AMERICAN: 22 mL/min — AB (ref >60)
Glucose, Bld: 392 mg/dL — ABNORMAL HIGH (ref 65–99)
POTASSIUM: 4 mmol/L (ref 3.5–5.1)
SODIUM: 137 mmol/L (ref 135–145)

## 2016-02-20 LAB — CBC
HEMATOCRIT: 26.6 % — AB (ref 36.0–46.0)
Hemoglobin: 8.6 g/dL — ABNORMAL LOW (ref 12.0–15.0)
MCH: 28.3 pg (ref 26.0–34.0)
MCHC: 32.3 g/dL (ref 30.0–36.0)
MCV: 87.5 fL (ref 78.0–100.0)
Platelets: 327 10*3/uL (ref 150–400)
RBC: 3.04 MIL/uL — ABNORMAL LOW (ref 3.87–5.11)
RDW: 14.2 % (ref 11.5–15.5)
WBC: 7.4 10*3/uL (ref 4.0–10.5)

## 2016-02-20 LAB — URINE MICROSCOPIC-ADD ON: WBC UA: NONE SEEN WBC/hpf (ref 0–5)

## 2016-02-20 LAB — LIPASE, BLOOD: LIPASE: 32 U/L (ref 11–51)

## 2016-02-20 MED ORDER — OSELTAMIVIR PHOSPHATE 75 MG PO CAPS: 75.0000 mg | ORAL_CAPSULE | Freq: Two times a day (BID) | ORAL | Status: DC

## 2016-02-20 MED ORDER — ALBUTEROL SULFATE (2.5 MG/3ML) 0.083% IN NEBU
5.0000 mg | INHALATION_SOLUTION | Freq: Once | RESPIRATORY_TRACT | Status: AC
  Administered 2016-02-20: 5 mg via RESPIRATORY_TRACT
  Filled 2016-02-20: qty 6

## 2016-02-20 MED ORDER — SODIUM CHLORIDE 0.9 % IV BOLUS (SEPSIS)
1000.0000 mL | Freq: Once | INTRAVENOUS | Status: AC
  Administered 2016-02-20: 1000 mL via INTRAVENOUS

## 2016-02-20 MED ORDER — ONDANSETRON HCL 4 MG/2ML IJ SOLN
4.0000 mg | Freq: Once | INTRAMUSCULAR | Status: AC
  Administered 2016-02-20: 4 mg via INTRAVENOUS
  Filled 2016-02-20: qty 2

## 2016-02-20 MED ORDER — ONDANSETRON HCL 4 MG PO TABS: 4.0000 mg | ORAL_TABLET | Freq: Three times a day (TID) | ORAL | Status: DC | PRN

## 2016-02-20 MED ORDER — IPRATROPIUM BROMIDE 0.02 % IN SOLN
0.5000 mg | Freq: Once | RESPIRATORY_TRACT | Status: AC
  Administered 2016-02-20: 0.5 mg via RESPIRATORY_TRACT
  Filled 2016-02-20: qty 2.5

## 2016-02-20 MED ORDER — SODIUM CHLORIDE 0.9 % IV SOLN
INTRAVENOUS | Status: DC
  Administered 2016-02-20: 5.1 [IU]/h via INTRAVENOUS
  Filled 2016-02-20: qty 2.5

## 2016-02-20 NOTE — Discharge Instructions (Signed)
Drink plenty of fluids today, avoid milk products until the diarrhea is gone. Monitor your blood sugar closely today. Uses Zofran for nausea or vomiting. You can take Imodium over-the-counter for diarrhea if needed. You can take acetaminophen 650 up to 1000 mg 4 times a day for fever and body aches. Take the Tamiflu for possible influenza or flu.  Return to the emergency department if you get worse again like you were tonight. Take mucinex DM OTC for cough.    Dehydration, Adult Dehydration means your body does not have as much fluid or water as it needs. It happens when you take in less fluid than you lose. Your kidneys, brain, and heart will not work properly without the right amount of fluids.  Dehydration can range from mild to severe. It should be treated right away to help prevent it from becoming severe. HOME CARE  Drink enough fluid to keep your pee (urine) clear or pale yellow.  Drink water or fluid slowly by taking small sips. You can also try sucking on ice cubes.  Have food or drinks that contain electrolytes. Examples include bananas and sports drinks.  Take over-the-counter and prescription medicines only as told by your doctor.  Prepare oral rehydration solution (ORS) according to the instructions that came with it. Take sips of ORS every 5 minutes until your pee returns to normal.  If you are throwing up (vomiting) or have watery poop (diarrhea), keep trying to drink water, ORS, or both.  If you have watery poop, avoid:  Drinks with caffeine.  Fruit juice.  Milk.  Carbonated soft drinks.  Do not take salt tablets. This can lead to having too much sodium in your body (hypernatremia). GET HELP IF:  You cannot eat or drink without throwing up.  You have had mild watery poop for longer than 24 hours.  You have a fever. GET HELP RIGHT AWAY IF:   You have very strong thirst.  You have very bad watery poop.  You have not peed in 6-8 hours, or you have peed only a  small amount of very dark pee.  You have shriveled skin.  You are dizzy, confused, or both.   This information is not intended to replace advice given to you by your health care provider. Make sure you discuss any questions you have with your health care provider.   Document Released: 10/01/2009 Document Revised: 08/26/2015 Document Reviewed: 04/22/2015 Elsevier Interactive Patient Education 2016 [place].  Diarrhea Diarrhea is watery poop (stool). It can make you feel weak, tired, thirsty, or give you a dry mouth (signs of dehydration). Watery poop is a sign of another problem, most often an infection. It often lasts 2-3 days. It can last longer if it is a sign of something serious. Take care of yourself as told by your doctor. HOME CARE   Drink 1 cup (8 ounces) of fluid each time you have watery poop.  Do not drink the following fluids:  Those that contain simple sugars (fructose, glucose, galactose, lactose, sucrose, maltose).  Sports drinks.  Fruit juices.  Whole milk products.  Sodas.  Drinks with caffeine (coffee, tea, soda) or alcohol.  Oral rehydration solution may be used if the doctor says it is okay. You may make your own solution. Follow this recipe:   - teaspoon table salt.   teaspoon baking soda.   teaspoon salt substitute containing potassium chloride.  1 tablespoons sugar.  1 liter (34 ounces) of water.  Avoid the following foods:  High fiber foods, such as raw fruits and vegetables.  Nuts, seeds, and whole grain breads and cereals.   Those that are sweetened with sugar alcohols (xylitol, sorbitol, mannitol).  Try eating the following foods:  Starchy foods, such as rice, toast, pasta, low-sugar cereal, oatmeal, baked potatoes, crackers, and bagels.  Bananas.  Applesauce.  Eat probiotic-rich foods, such as yogurt and milk products that are fermented.  Wash your hands well after each time you have watery poop.  Only take medicine as  told by your doctor.  Take a warm bath to help lessen burning or pain from having watery poop. GET HELP RIGHT AWAY IF:   You cannot drink fluids without throwing up (vomiting).  You keep throwing up.  You have blood in your poop, or your poop looks black and tarry.  You do not pee (urinate) in 6-8 hours, or there is only a small amount of very dark pee.  You have belly (abdominal) pain that gets worse or stays in the same spot (localizes).  You are weak, dizzy, confused, or light-headed.  You have a very bad headache.  Your watery poop gets worse or does not get better.  You have a fever or lasting symptoms for more than 2-3 days.  You have a fever and your symptoms suddenly get worse. MAKE SURE YOU:   Understand these instructions.  Will watch your condition.  Will get help right away if you are not doing well or get worse.   This information is not intended to replace advice given to you by your health care provider. Make sure you discuss any questions you have with your health care provider.   Document Released: 05/23/2008 Document Revised: 12/26/2014 Document Reviewed: 08/12/2012 Elsevier Interactive Patient Education 2016 Elsevier Inc.  Nausea and Vomiting Nausea means you feel sick to your stomach. Throwing up (vomiting) is a reflex where stomach contents come out of your mouth. HOME CARE   Take medicine as told by your doctor.  Do not force yourself to eat. However, you do need to drink fluids.  If you feel like eating, eat a normal diet as told by your doctor.  Eat rice, wheat, potatoes, bread, lean meats, yogurt, fruits, and vegetables.  Avoid high-fat foods.  Drink enough fluids to keep your pee (urine) clear or pale yellow.  Ask your doctor how to replace body fluid losses (rehydrate). Signs of body fluid loss (dehydration) include:  Feeling very thirsty.  Dry lips and mouth.  Feeling dizzy.  Dark pee.  Peeing less than normal.  Feeling  confused.  Fast breathing or heart rate. GET HELP RIGHT AWAY IF:   You have blood in your throw up.  You have black or bloody poop (stool).  You have a bad headache or stiff neck.  You feel confused.  You have bad belly (abdominal) pain.  You have chest pain or trouble breathing.  You do not pee at least once every 8 hours.  You have cold, clammy skin.  You keep throwing up after 24 to 48 hours.  You have a fever. MAKE SURE YOU:   Understand these instructions.  Will watch your condition.  Will get help right away if you are not doing well or get worse.   This information is not intended to replace advice given to you by your health care provider. Make sure you discuss any questions you have with your health care provider.   Document Released: 05/23/2008 Document Revised: 02/27/2012 Document Reviewed: 05/06/2011 Elsevier Interactive  Patient Education 2016 [place].  Hyperglycemia High blood sugar (hyperglycemia) means that the level of sugar in your blood is higher than it should be. Signs of high blood sugar include:  Feeling thirsty.  Frequent peeing (urinating).  Feeling tired or sleepy.  Dry mouth.  Vision changes.  Feeling weak.  Feeling hungry but losing weight.  Numbness and tingling in your hands or feet.  Headache. When you ignore these signs, your blood sugar may keep going up. These problems may get worse, and other problems may begin. HOME CARE  Check your blood sugars as told by your doctor. Write down the numbers with the date and time.  Take the right amount of insulin or diabetes pills at the right time. Write down the dose with date and time.  Refill your insulin or diabetes pills before running out.  Watch what you eat. Follow your meal plan.  Drink liquids without sugar, such as water. Check with your doctor if you have kidney or heart disease.  Follow your doctor's orders for exercise. Exercise at the same time of  day.  Keep your doctor's appointments. GET HELP RIGHT AWAY IF:   You have trouble thinking or are confused.  You have fast breathing with fruity smelling breath.  You pass out (faint).  You have 2 to 3 days of high blood sugars and you do not know why.  You have chest pain.  You are feeling sick to your stomach (nauseous) or throwing up (vomiting).  You have sudden vision changes. MAKE SURE YOU:   Understand these instructions.  Will watch your condition.  Will get help right away if you are not doing well or get worse.   This information is not intended to replace advice given to you by your health care provider. Make sure you discuss any questions you have with your health care provider.   Document Released: 10/02/2009 Document Revised: 12/26/2014 Document Reviewed: 08/11/2015 Elsevier Interactive Patient Education [employer].

## 2016-02-23 MED FILL — AMOXICILLIN 500 MG CAPSULE: 500 | 10 days supply | Qty: 30 | Fill #0

## 2016-02-23 MED FILL — VIT D2 1.25 MG (50,000 UNIT: 1.25 MG | 56 days supply | Qty: 8 | Fill #0

## 2016-02-23 MED FILL — LEVOTHYROXINE 175 MCG TAB: 175 | 30 days supply | Qty: 30 | Fill #1

## 2016-02-25 ENCOUNTER — Encounter: Payer: Self-pay | Admitting: Pharmacist

## 2016-02-25 ENCOUNTER — Ambulatory Visit: Payer: Self-pay | Attending: Family Medicine | Admitting: Pharmacist

## 2016-02-25 DIAGNOSIS — E1029 Type 1 diabetes mellitus with other diabetic kidney complication: Secondary | ICD-10-CM

## 2016-02-25 LAB — POCT URINALYSIS DIPSTICK
Bilirubin, UA: 0
Glucose, UA: 500
Ketones, UA: 0
Leukocytes, UA: NEGATIVE
PH UA: 6
PROTEIN UA: 300
SPEC GRAV UA: 1.01

## 2016-02-25 LAB — GLUCOSE, POCT (MANUAL RESULT ENTRY): POC Glucose: 444 mg/dl — AB (ref 70–99)

## 2016-02-25 MED ORDER — INSULIN ASPART 100 UNIT/ML ~~LOC~~ SOLN
20.0000 [IU] | Freq: Once | SUBCUTANEOUS | Status: AC
  Administered 2016-02-25: 10 [IU] via SUBCUTANEOUS

## 2016-02-25 NOTE — Patient Instructions (Addendum)
Thanks for coming to see me!  Dr. Adrian Blackwater would like for you to go to the ED for fluids and management of your high blood sugar  Take your insulins as prescribed:  Lantus 40 units daily  Novolog sliding scale  CBG < 70: Call MD CBG 70 - 120: 0 units CBG 121 - 150: 2 units CBG 151 - 200: 3 units CBG 201 - 250: 5 units CBG 251 - 300: 8 units CBG 301 - 350: 11 units CBG 351 - 400: 15 units CBG > 400: call MD

## 2016-02-25 NOTE — Progress Notes (Signed)
S:    Patient arrives in good spirits.  Presents for diabetes evaluation, education, and management at the request of Dr. Adrian Blackwater. Patient was referred on 02/04/16.  Patient was last seen by Primary Care Provider on 02/04/16.   Patient reports adherence with medications.  Current diabetes medications include: Toujeo 35 units daily and Novolog sliding scale.  Patient denies hypoglycemic events.  Patient reported dietary habits: Eats 2 meals/day. She has had a poor appetite due to recent illness.  Patient reported exercise habits: none.   Patient reports nocturia.  Patient reports neuropathy. Patient reports chronic visual changes. Patient reports self foot exams.   Patient has been sick for a few weeks. She reports continued chills, myalgias, cough, and SOB. She did NOT start the Tamiflu prescribed in the ED.    O:  Lab Results  Component Value Date   HGBA1C 10.10 02/04/2016   There were no vitals filed for this visit.  Patient does not have log book or meter with her at visit  Home fasting CBG: 200s-300s 2 hour post-prandial/random CBG: 300s-400s.  POCT glucose = 444   A/P: Type 1 Diabetes currently uncontrolled based on A1c of 10.10. Patient denies hypoglycemic events and is able to verbalize appropriate hypoglycemia management plan. Patient reports adherence with medication. Control is suboptimal due to inadequate insulin coverage and sedentary lifestyle.  Insulin aspart 10 units x 1. Patient was unable to urinate until after completion of the visit. Urinalysis showed negative ketones. Due to hyperglycemia and need for fluids (patient dehydrated), discussed case with Dr. Adrian Blackwater, patient recommended that patient go to the emergency room for further management.  Continued basal insulin Lantus (insulin glargine) at 40 units as prescribed (patient to stop the 35 units). Continued rapid insulin Novolog (insulin aspart) sliding scale and patient to take as prescribed. Stressed the  importance of taking her medications as prescribed. Patient verbalized understanding.   Consider changing pravastatin to atorvastatin for high-intensity statin dosage in setting of diabetes and elevated ASCVD risk.  Next A1C anticipated May 2017.    Written patient instructions provided.  Total time in face to face counseling 20 minutes.  Follow up in Pharmacist Clinic Visit as needed, next visit with Dr. Adrian Blackwater as directed.   Patient seen with Jamie Brookes, PharmD Candidate

## 2016-03-31 ENCOUNTER — Telehealth: Payer: Self-pay | Admitting: Clinical

## 2016-03-31 NOTE — Telephone Encounter (Signed)
Termination with pt; Angela [name] aware she may make an appointment with [facility] prior to end of April, as needed

## 2016-04-18 MED FILL — AMLODIPINE BESYLATE 10 MG T: 10 | 30 days supply | Qty: 30 | Fill #1

## 2016-04-18 MED FILL — ?PANTOPRAZOLE SOD DR 40MG: 40 MG | 30 days supply | Qty: 30 | Fill #1

## 2016-04-18 MED FILL — METOCLOPRAMIDE 10 MG TABLET: 10 | 30 days supply | Qty: 60 | Fill #1

## 2016-04-18 MED FILL — PRAVASTATIN NA 20 MG TAB: 20 | 30 days supply | Qty: 30 | Fill #1

## 2016-06-28 ENCOUNTER — Other Ambulatory Visit: Payer: Self-pay | Admitting: Family Medicine

## 2016-06-28 MED FILL — ?LEVOTHYROXINE 175 MCG TABL: 175 | 30 days supply | Qty: 30 | Fill #2

## 2016-06-28 MED FILL — !TOUJEO SOLOSTAR 300 UNITS/: 300/ML | 23 days supply | Qty: 2 | Fill #1

## 2016-06-28 MED FILL — GABAPENTIN 300 MG CAPSULE: 300 | 30 days supply | Qty: 60 | Fill #0

## 2016-12-30 ENCOUNTER — Ambulatory Visit: Payer: Self-pay | Attending: Family Medicine | Admitting: Family Medicine

## 2016-12-30 ENCOUNTER — Encounter: Payer: Self-pay | Admitting: Family Medicine

## 2016-12-30 VITALS — BP 94/62 | HR 94 | Temp 98.3°F | Resp 16 | Wt 144.6 lb

## 2016-12-30 DIAGNOSIS — E1022 Type 1 diabetes mellitus with diabetic chronic kidney disease: Secondary | ICD-10-CM | POA: Insufficient documentation

## 2016-12-30 DIAGNOSIS — E1065 Type 1 diabetes mellitus with hyperglycemia: Secondary | ICD-10-CM

## 2016-12-30 DIAGNOSIS — K0889 Other specified disorders of teeth and supporting structures: Secondary | ICD-10-CM

## 2016-12-30 DIAGNOSIS — Z7982 Long term (current) use of aspirin: Secondary | ICD-10-CM | POA: Insufficient documentation

## 2016-12-30 DIAGNOSIS — E038 Other specified hypothyroidism: Secondary | ICD-10-CM | POA: Insufficient documentation

## 2016-12-30 DIAGNOSIS — E1051 Type 1 diabetes mellitus with diabetic peripheral angiopathy without gangrene: Secondary | ICD-10-CM

## 2016-12-30 DIAGNOSIS — R102 Pelvic and perineal pain: Secondary | ICD-10-CM | POA: Insufficient documentation

## 2016-12-30 DIAGNOSIS — R21 Rash and other nonspecific skin eruption: Secondary | ICD-10-CM | POA: Insufficient documentation

## 2016-12-30 DIAGNOSIS — Z794 Long term (current) use of insulin: Secondary | ICD-10-CM | POA: Insufficient documentation

## 2016-12-30 DIAGNOSIS — I10 Essential (primary) hypertension: Secondary | ICD-10-CM

## 2016-12-30 DIAGNOSIS — N189 Chronic kidney disease, unspecified: Secondary | ICD-10-CM | POA: Insufficient documentation

## 2016-12-30 DIAGNOSIS — N319 Neuromuscular dysfunction of bladder, unspecified: Secondary | ICD-10-CM | POA: Insufficient documentation

## 2016-12-30 DIAGNOSIS — E039 Hypothyroidism, unspecified: Secondary | ICD-10-CM

## 2016-12-30 DIAGNOSIS — IMO0002 Reserved for concepts with insufficient information to code with codable children: Secondary | ICD-10-CM

## 2016-12-30 DIAGNOSIS — I129 Hypertensive chronic kidney disease with stage 1 through stage 4 chronic kidney disease, or unspecified chronic kidney disease: Secondary | ICD-10-CM | POA: Insufficient documentation

## 2016-12-30 DIAGNOSIS — E1029 Type 1 diabetes mellitus with other diabetic kidney complication: Secondary | ICD-10-CM

## 2016-12-30 LAB — POCT URINALYSIS DIPSTICK
Bilirubin (Urine): NEGATIVE
Glucose, UA: NEGATIVE
KETONES UA: NEGATIVE
LEUKOCYTES UA: NEGATIVE
NITRITE UA: NEGATIVE
PH UA: 6
PROTEIN UA: 100
Specific Gravity: <=1.005
UROBILINOGEN UA: 0.2

## 2016-12-30 LAB — TSH: TSH: 5.89 m[IU]/L — AB

## 2016-12-30 LAB — POCT GLYCOSYLATED HEMOGLOBIN (HGB A1C): HEMOGLOBIN A1C: 8.9

## 2016-12-30 LAB — GLUCOSE, POCT (MANUAL RESULT ENTRY)
POC GLUCOSE: 124 mg/dL — AB (ref 70–99)
POC Glucose: 285 mg/dl — AB (ref 70–99)

## 2016-12-30 LAB — POCT URINE PREGNANCY: Preg Test, Ur: NEGATIVE

## 2016-12-30 MED ORDER — ACETAMINOPHEN-CODEINE #3 300-30 MG PO TABS: 1.0000 | ORAL_TABLET | Freq: Three times a day (TID) | ORAL | 0 refills | Status: DC | PRN

## 2016-12-30 MED ORDER — AMLODIPINE BESYLATE 5 MG PO TABS: 5.0000 mg | ORAL_TABLET | Freq: Every day | ORAL | 3 refills | Status: DC

## 2016-12-30 MED ORDER — HYDROXYZINE HCL 25 MG PO TABS: 25.0000 mg | ORAL_TABLET | Freq: Every evening | ORAL | 2 refills | Status: DC | PRN

## 2016-12-30 MED ORDER — INSULIN ASPART 100 UNIT/ML ~~LOC~~ SOLN: 15.0000 [IU] | Freq: Three times a day (TID) | SUBCUTANEOUS | 1 refills | Status: DC

## 2016-12-30 MED ORDER — INSULIN GLARGINE 300 UNIT/ML ~~LOC~~ SOPN: 40.0000 [IU] | PEN_INJECTOR | Freq: Every day | SUBCUTANEOUS | 3 refills | Status: DC

## 2016-12-30 MED ORDER — AMOXICILLIN 500 MG PO CAPS: 500.0000 mg | ORAL_CAPSULE | Freq: Three times a day (TID) | ORAL | 0 refills | Status: AC

## 2016-12-30 MED ORDER — GABAPENTIN 300 MG PO CAPS: 600.0000 mg | ORAL_CAPSULE | Freq: Every day | ORAL | 2 refills | Status: DC

## 2016-12-30 MED FILL — !NOVOLOG 100UNITS/ML VIAL: 100/ML | 22 days supply | Qty: 10 | Fill #0

## 2016-12-30 MED FILL — ACETAMINOPHEN/COD #3 TABLET: 300-30 | 20 days supply | Qty: 60 | Fill #0

## 2016-12-30 MED FILL — ?AMOXICILLIN 500 MG CAPSULE: 500 | 10 days supply | Qty: 30 | Fill #0

## 2016-12-30 MED FILL — !TOUJEO SOLOSTAR 300 UNITS/: 300/ML | 30 days supply | Qty: 3 | Fill #0

## 2016-12-30 MED FILL — ?AMLODIPINE BESYLATE 5 MG T: 5 | 30 days supply | Qty: 30 | Fill #0

## 2016-12-30 MED FILL — GABAPENTIN 300 MG CAPSULE: 300 | 30 days supply | Qty: 60 | Fill #0

## 2016-12-30 MED FILL — hydrOXYzine HCL 25 MG TABS: 25 | 30 days supply | Qty: 30 | Fill #0

## 2016-12-30 NOTE — Progress Notes (Signed)
Pt is in the office today for a rash, diabetes, pelvic and tail bone  Pt states her pain level today in the office is a 4 Pt states the pain has been going on for 2 weeks  Pt states she has not taken any medicine for the pain Pt states the only medicine she takes is the meds for her diabetes Pt states she takes her bp medicine once a blue moon

## 2016-12-30 NOTE — Patient Instructions (Addendum)
Angela [name] was seen today for diabetes and rash.  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  Pelvic pain -     POCT urinalysis dipstick -     DG Lumbar Spine 2-3 Views; Future -     CT RENAL STONE STUDY; Future -     POCT urine pregnancy -     acetaminophen-codeine (TYLENOL #3) 300-30 MG tablet; Take 1 tablet by mouth every 8 (eight) hours as needed for moderate pain.  Type 1 diabetes, uncontrolled, with peripheral circulatory disorder (HCC) -     insulin aspart (NOVOLOG) 100 UNIT/ML injection; Inject 15 Units into the skin 3 (three) times daily with meals. 0-15 Units, Subcutaneous, 3 times daily with meals CBG < 70: Call MD CBG 70 - 120: 0 units CBG 121 - 150: 2 units CBG 151 - 200: 3 units CBG 201 - 250: 5 units CBG 251 - 300: 8 units CBG 301 - 350: 11 units CBG 351 - 400: 15 units CBG > 400: call MD  Hypothyroidism, unspecified type -     TSH  Rash -     hydrOXYzine (ATARAX/VISTARIL) 25 MG tablet; Take 1 tablet (25 mg total) by mouth at bedtime as needed.  Pain of molar -     amoxicillin (AMOXIL) 500 MG capsule; Take 1 capsule (500 mg total) by mouth 3 (three) times daily.  Neurogenic bladder -     Ambulatory referral to Urology   F/u in 4 weeks sooner if needed for pelvic and back pain   Dr.  Adrian Blackwater

## 2016-12-30 NOTE — Progress Notes (Signed)
Subjective:  Patient ID: Angela [name], female    DOB: 10-Mar-1986  Age: 31 y.o. MRN: [id number]  CC: Diabetes and Rash   HPI Bora Bost has diabetes type 1 with CKD, hypothyroidism, HTN she  presents for    1. CHRONIC DIABETES: compliant with insulin. No low CBGs. Taking better care of herself. Worked for a while. In counseling.   2. Hypothyroidism: taking synthroid. Has pelvic pains. No swelling. No weight changes. No palpitations.   3.  Pelvic pain:  Started one month ago. B/l anterior pelvic pain. Radiating down groin bilaterally. Has dysuria that comes and goes. No blood in urine. She is not sexually active. She has urinary hesitancy.   4. Rash: started 2 week ago. Went to ED on 12/15/2017. Started after IV iron infusion. Treated with benadryl. Rash improved. Still with some rash at night with itching. Slight or arms.   Social History  Substance Use Topics  . Smoking status: Former Smoker    Packs/day: 0.25    Years: 2.00    Types: Cigarettes    Quit date: 03/04/2013  . Smokeless tobacco: Never Used  . Alcohol use No    Outpatient Medications Prior to Visit  Medication Sig Dispense Refill  . amLODipine (NORVASC) 5 MG tablet Take 1 tablet (5 mg total) by mouth daily. 30 tablet 3  . insulin aspart (NOVOLOG) 100 UNIT/ML injection Inject 15 Units into the skin 3 (three) times daily with meals. 0-15 Units, Subcutaneous, 3 times daily with meals CBG < 70: Call MD CBG 70 - 120: 0 units CBG 121 - 150: 2 units CBG 151 - 200: 3 units CBG 201 - 250: 5 units CBG 251 - 300: 8 units CBG 301 - 350: 11 units CBG 351 - 400: 15 units CBG > 400: call MD 10 mL 1  . Insulin Glargine (TOUJEO SOLOSTAR) 300 UNIT/ML SOPN Inject 40 Units into the skin at bedtime. (Patient taking differently: Inject 35 Units into the skin at bedtime. ) 6 mL 3  . levothyroxine (SYNTHROID, LEVOTHROID) 175 MCG tablet Take 1 tablet (175 mcg total) by mouth daily before breakfast. 30 tablet 2  . albuterol  (PROVENTIL HFA;VENTOLIN HFA) 108 (90 Base) MCG/ACT inhaler Inhale 1-2 puffs into the lungs every 6 (six) hours as needed for wheezing or shortness of breath. (Patient not taking: Reported on 12/30/2016) 54 g 3  . amoxicillin (AMOXIL) 500 MG capsule Take 1 capsule (500 mg total) by mouth 3 (three) times daily. (Patient not taking: Reported on 12/30/2016) 30 capsule 0  . aspirin EC 81 MG tablet Take 1 tablet (81 mg total) by mouth daily. (Patient not taking: Reported on 12/30/2016)    . busPIRone (BUSPAR) 7.5 MG tablet Take 1 tablet (7.5 mg total) by mouth 2 (two) times daily with a meal. (Patient not taking: Reported on 12/30/2016) 60 tablet 2  . ferrous sulfate 325 (65 FE) MG tablet Take 1 tablet (325 mg total) by mouth 3 (three) times daily with meals. (Patient not taking: Reported on 12/30/2016) 90 tablet 3  . gabapentin (NEURONTIN) 300 MG capsule TAKE 2 CAPSULES BY MOUTH AT BEDTIME. (Patient not taking: Reported on 12/30/2016) 60 capsule 2  . metoCLOPramide (REGLAN) 5 MG tablet Take 1 tablet (5 mg total) by mouth 4 (four) times daily -  before meals and at bedtime. (Patient not taking: Reported on 12/30/2016) 120 tablet 2  . ondansetron (ZOFRAN) 4 MG tablet Take 1 tablet (4 mg total) by mouth every 8 (eight) hours as needed  for nausea or vomiting. (Patient not taking: Reported on 12/30/2016) 8 tablet 0  . oseltamivir (TAMIFLU) 75 MG capsule Take 1 capsule (75 mg total) by mouth every 12 (twelve) hours. (Patient not taking: Reported on 12/30/2016) 10 capsule 0  . pantoprazole (PROTONIX) 40 MG tablet Take 1 tablet (40 mg total) by mouth daily before supper. (Patient not taking: Reported on 12/30/2016) 30 tablet 1  . pravastatin (PRAVACHOL) 20 MG tablet Take 1 tablet (20 mg total) by mouth every evening. (Patient not taking: Reported on 12/30/2016) 30 tablet 3  . traZODone (DESYREL) 100 MG tablet Take 1 tablet (100 mg total) by mouth at bedtime as needed for sleep. (Patient not taking: Reported on 12/30/2016) 30  tablet 1  . vitamin B-12 (CYANOCOBALAMIN) 250 MCG tablet Take 1 tablet (250 mcg total) by mouth daily. (Patient not taking: Reported on 12/30/2016) 14 tablet 0  . Vitamin D, Ergocalciferol, (DRISDOL) 50000 units CAPS capsule Take 1 capsule (50,000 Units total) by mouth every 7 (seven) days. For 8 weeks (Patient not taking: Reported on 12/30/2016) 8 capsule 0   Facility-Administered Medications Prior to Visit  Medication Dose Route Frequency Provider Last Rate Last Dose  . 0.9 %  sodium chloride infusion   Intravenous Continuous Arnoldo Morale, MD 150 mL/hr at 01/05/16 1520 1,000 mL at 01/05/16 1520  . insulin aspart (novoLOG) injection 10 Units  10 Units Subcutaneous Once Arnoldo Morale, MD        ROS Review of Systems  Constitutional: Negative for chills and fever.  Eyes: Negative for visual disturbance.  Respiratory: Negative for shortness of breath.   Cardiovascular: Negative for chest pain.  Gastrointestinal: Negative for abdominal pain and blood in stool.  Genitourinary: Positive for difficulty urinating and pelvic pain.  Musculoskeletal: Positive for back pain. Negative for arthralgias.  Skin: Positive for rash.  Allergic/Immunologic: Negative for immunocompromised state.  Hematological: Negative for adenopathy. Does not bruise/bleed easily.  Psychiatric/Behavioral: Negative for dysphoric mood and suicidal ideas.    Objective:  BP 94/62 (BP Location: Right Arm, Patient Position: Sitting, Cuff Size: Small)   Pulse 94   Temp 98.3 F (36.8 C) (Oral)   Resp 16   Wt 144 lb 9.6 oz (65.6 kg)   SpO2 97%   BMI 24.06 kg/m   BP/Weight 12/30/2016 12/22/4313 4/0/0867  Systolic BP 94 619 -  Diastolic BP 62 76 -  Wt. (Lbs) 144.6 146 -  BMI 24.06 - 24.3     Physical Exam  Constitutional: She is oriented to person, place, and time. She appears well-developed and well-nourished. No distress.  HENT:  Head: Normocephalic and atraumatic.  Cardiovascular: Normal rate, regular rhythm, normal  heart sounds and intact distal pulses.   Pulmonary/Chest: Effort normal and breath sounds normal.  Genitourinary: Vagina normal and uterus normal. Pelvic exam was performed with patient prone. There is no rash, tenderness or lesion on the right labia. There is no rash, tenderness or lesion on the left labia. Cervix exhibits no motion tenderness, no discharge and no friability.  Musculoskeletal: She exhibits no edema.  Lymphadenopathy:       Right: No inguinal adenopathy present.       Left: No inguinal adenopathy present.  Neurological: She is alert and oriented to person, place, and time.  Skin: Skin is warm and dry. No rash noted.  Psychiatric: She has a normal mood and affect.    Lab Results  Component Value Date   HGBA1C 10.10 02/04/2016   Lab Results  Component Value Date  HGBA1C 8.9 12/30/2016    CBG 285  UA: small RBC. 100 protein, otherwise normal U preg: negative  Treated with 10 U of novolog Repeat CBG 124   Assessment & Plan:   Caisley was seen today for diabetes and rash.  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 -     gabapentin (NEURONTIN) 300 MG capsule; Take 2 capsules (600 mg total) by mouth at bedtime. -     POCT glucose (manual entry)  Pelvic pain -     POCT urinalysis dipstick -     DG Lumbar Spine 2-3 Views; Future -     CT RENAL STONE STUDY; Future -     POCT urine pregnancy -     acetaminophen-codeine (TYLENOL #3) 300-30 MG tablet; Take 1 tablet by mouth every 8 (eight) hours as needed for moderate pain.  Type 1 diabetes, uncontrolled, with peripheral circulatory disorder (HCC) -     insulin aspart (NOVOLOG) 100 UNIT/ML injection; Inject 15 Units into the skin 3 (three)  times daily with meals. 0-15 Units, Subcutaneous, 3 times daily with meals CBG < 70: Call MD CBG 70 - 120: 0 units CBG 121 - 150: 2 units CBG 151 - 200: 3 units CBG 201 - 250: 5 units CBG 251 - 300: 8 units CBG 301 - 350: 11 units CBG 351 - 400: 15 units CBG > 400: call MD  Hypothyroidism, unspecified type -     TSH  Rash -     hydrOXYzine (ATARAX/VISTARIL) 25 MG tablet; Take 1 tablet (25 mg total) by mouth at bedtime as needed.  Pain of molar -     amoxicillin (AMOXIL) 500 MG capsule; Take 1 capsule (500 mg total) by mouth 3 (three) times daily.  Neurogenic bladder -     Ambulatory referral to Urology  Essential hypertension -     amLODipine (NORVASC) 5 MG tablet; Take 1 tablet (5 mg total) by mouth daily.  Other specified hypothyroidism -     levothyroxine (SYNTHROID, LEVOTHROID) 200 MCG tablet; Take 1 tablet (200 mcg total) by mouth daily before breakfast.    No orders of the defined types were placed in this encounter.   Follow-up: Return in about 4 weeks (around 01/27/2017) for pelvic pain .   Boykin Nearing MD

## 2017-01-02 MED ORDER — LEVOTHYROXINE SODIUM 200 MCG PO TABS: 200.0000 ug | ORAL_TABLET | Freq: Every day | ORAL | 5 refills | Status: DC

## 2017-01-02 NOTE — Assessment & Plan Note (Signed)
TSH improved but still elevated Increase synthroid dose to 200 mcg daily, new dose ordered

## 2017-01-03 DIAGNOSIS — R102 Pelvic and perineal pain: Secondary | ICD-10-CM | POA: Insufficient documentation

## 2017-01-03 DIAGNOSIS — R21 Rash and other nonspecific skin eruption: Secondary | ICD-10-CM | POA: Insufficient documentation

## 2017-01-03 NOTE — Assessment & Plan Note (Signed)
Diabetes has improved continue toujeo Increase dose to 40 U at bedtime Continue SS novolog

## 2017-01-03 NOTE — Assessment & Plan Note (Signed)
Persistent rash following IV iron infusion Possible allergic reaction Treat with atarax Patient to discuss with her nephrologist

## 2017-01-03 NOTE — Assessment & Plan Note (Signed)
Pelvic pain Normal pelvic Normal UA except proteinuria Patient with hx of recurrent UTI and pyelonephritis in setting of neurogenic bladder Plan: Lumbar x-ray CT renal stone study

## 2017-01-04 ENCOUNTER — Ambulatory Visit (HOSPITAL_COMMUNITY): Admission: RE | Admit: 2017-01-04 | Payer: Self-pay | Source: Ambulatory Visit

## 2017-01-11 ENCOUNTER — Telehealth: Payer: Self-pay

## 2017-01-11 ENCOUNTER — Other Ambulatory Visit: Payer: Self-pay

## 2017-01-11 DIAGNOSIS — E038 Other specified hypothyroidism: Secondary | ICD-10-CM

## 2017-01-11 MED ORDER — LEVOTHYROXINE SODIUM 200 MCG PO TABS: 200.0000 ug | ORAL_TABLET | Freq: Every day | ORAL | 5 refills | Status: DC

## 2017-01-11 NOTE — Telephone Encounter (Signed)
Pt was called and a VM was left informing pt to return phone call for lab results.

## 2017-01-11 NOTE — Telephone Encounter (Signed)
Pt returned phone call and pt is aware of lab results. Pt request medication be sent to pharmacy in [place]. Medication is being sent over today (01/11/17)

## 2017-05-03 ENCOUNTER — Encounter: Payer: Self-pay | Admitting: Family Medicine

## 2017-06-23 ENCOUNTER — Ambulatory Visit: Payer: Self-pay | Attending: Family Medicine | Admitting: Family Medicine

## 2017-06-23 ENCOUNTER — Encounter: Payer: Self-pay | Admitting: Family Medicine

## 2017-06-23 ENCOUNTER — Ambulatory Visit: Payer: Self-pay | Admitting: Licensed Clinical Social Worker

## 2017-06-23 VITALS — BP 144/95 | HR 85 | Temp 97.9°F | Ht 65.0 in | Wt 134.6 lb

## 2017-06-23 DIAGNOSIS — E1029 Type 1 diabetes mellitus with other diabetic kidney complication: Secondary | ICD-10-CM | POA: Insufficient documentation

## 2017-06-23 DIAGNOSIS — M79672 Pain in left foot: Secondary | ICD-10-CM | POA: Insufficient documentation

## 2017-06-23 DIAGNOSIS — E039 Hypothyroidism, unspecified: Secondary | ICD-10-CM | POA: Insufficient documentation

## 2017-06-23 DIAGNOSIS — F419 Anxiety disorder, unspecified: Secondary | ICD-10-CM | POA: Insufficient documentation

## 2017-06-23 DIAGNOSIS — F329 Major depressive disorder, single episode, unspecified: Secondary | ICD-10-CM | POA: Insufficient documentation

## 2017-06-23 DIAGNOSIS — I10 Essential (primary) hypertension: Secondary | ICD-10-CM

## 2017-06-23 DIAGNOSIS — N183 Chronic kidney disease, stage 3 (moderate): Secondary | ICD-10-CM

## 2017-06-23 DIAGNOSIS — F32A Depression, unspecified: Secondary | ICD-10-CM | POA: Insufficient documentation

## 2017-06-23 DIAGNOSIS — R109 Unspecified abdominal pain: Secondary | ICD-10-CM

## 2017-06-23 DIAGNOSIS — N1831 Chronic kidney disease, stage 3a: Secondary | ICD-10-CM

## 2017-06-23 LAB — GLUCOSE, POCT (MANUAL RESULT ENTRY): POC GLUCOSE: 187 mg/dL — AB (ref 70–99)

## 2017-06-23 LAB — POCT UA - MICROALBUMIN
Albumin/Creatinine Ratio, Urine, POC: >300
CREATININE, POC: 200 mg/dL
MICROALBUMIN (UR) POC: 150 mg/L

## 2017-06-23 LAB — POCT GLYCOSYLATED HEMOGLOBIN (HGB A1C): Hemoglobin A1C: 11.5

## 2017-06-23 MED ORDER — INSULIN GLARGINE 300 UNIT/ML ~~LOC~~ SOPN: 40.0000 [IU] | PEN_INJECTOR | Freq: Every day | SUBCUTANEOUS | 3 refills | Status: DC

## 2017-06-23 MED ORDER — LEVOTHYROXINE SODIUM 200 MCG PO TABS: 200.0000 ug | ORAL_TABLET | Freq: Every day | ORAL | 5 refills | Status: DC

## 2017-06-23 MED ORDER — ESCITALOPRAM OXALATE 10 MG PO TABS: 10.0000 mg | ORAL_TABLET | Freq: Every day | ORAL | 2 refills | Status: DC

## 2017-06-23 MED ORDER — AMLODIPINE BESYLATE 5 MG PO TABS: 5.0000 mg | ORAL_TABLET | Freq: Every day | ORAL | 3 refills | Status: DC

## 2017-06-23 MED ORDER — INSULIN ASPART 100 UNIT/ML ~~LOC~~ SOLN: 15.0000 [IU] | Freq: Three times a day (TID) | SUBCUTANEOUS | 1 refills | Status: DC

## 2017-06-23 MED FILL — ?ESCITALOPRAM 10 MG TABLET: 10 | 30 days supply | Qty: 30 | Fill #0

## 2017-06-23 MED FILL — !TOUJEO SOLOSTAR 300 UNITS/: 300/ML | 22 days supply | Qty: 2 | Fill #0

## 2017-06-23 MED FILL — AMLODIPINE BESYLATE 5 MG TA: 5 | 30 days supply | Qty: 30 | Fill #0

## 2017-06-23 MED FILL — ?LEVOTHYROXINE 200 MCG TAB: 200 | 30 days supply | Qty: 30 | Fill #0

## 2017-06-23 MED FILL — !NOVOLOG 100UNITS/ML VIAL: 100/ML | 22 days supply | Qty: 10 | Fill #0

## 2017-06-23 NOTE — Assessment & Plan Note (Signed)
Untreated x 2 months with depression Suspect patient is hypothyroid  Restart synthroid 200 mcg daily Check TSH

## 2017-06-23 NOTE — Assessment & Plan Note (Signed)
Patient missed her last nephrology appointment It has been rescheduled for next week

## 2017-06-23 NOTE — Assessment & Plan Note (Signed)
Chronic L sided flank pain

## 2017-06-23 NOTE — Assessment & Plan Note (Signed)
Hypertensive today Restart norvasc 5 mg daily

## 2017-06-23 NOTE — BH Specialist Note (Signed)
Integrated Behavioral Health Initial Visit  MRN: [id number] Name: Angela [name]   Session Start time: 11:10 AM Session End time: 11:50 AM Total time: 40 minutes  Type of Service: [place] Interpretor:No. Interpretor Name and Language: N/A   Warm Hand Off Completed.       SUBJECTIVE: Zyra Parrillo is a 31 y.o. female accompanied by patient. Patient was referred by Dr. Adrian Blackwater for anxiety and depression. Patient reports the following symptoms/concerns: overwhelming feelings of sadness and worry, difficulty sleeping, low energy, decreased appetite, irritability, and hx of suicidal ideations Duration of problem: "Since I was [age]"; Severity of problem: severe  OBJECTIVE: Mood: Depressed and Affect: Appropriate Risk of harm to self or others: Suicidal ideation No plan to harm self or others   LIFE CONTEXT: Family and Social: Pt recently moved back in to her mother's residence. Receives support from mother School/Work: Pt is a Agricultural engineer in Clinical research associate. She is unemployed  Self-Care: Pt smokes marijuana daily. She is open to medication management Life Changes: Pt recently broke up with girlfriend, relocating to [place] from [place]: Patient will reduce symptoms of: anxiety and depression and increase knowledge and/or ability of: coping skills and also: Increase healthy adjustment to current life circumstances, Increase adequate support systems for patient/family and Decrease self-medicating behaviors   INTERVENTIONS: Solution-Focused Strategies, Supportive Counseling, Psychoeducation and/or Health Education and Link to [employer]  Standardized Assessments completed: PHQ 2&9 with C-SSRS  ASSESSMENT: Patient currently experiencing depression and anxiety triggered by ongoing medical concerns and a recent breakup with girlfriend. She reports overwhelming feelings of sadness and worry, difficulty  sleeping, low energy, decreased appetite, irritability, and hx of suicidal ideations. Patient may benefit from psychoeducation, psychotherapy, and medication management. Ester educated pt on how stress can negatively impact one's physical and mental health. Pt denied current SI/HI/AVH, identified protective factors, and reports having safety plan. She was successful in identifying healthy coping skills and is participating in medication management through PCP. LCSWA provided pt with resources for psychotherapy, med management, employment, and crisis intervention.   PLAN: 1. Follow up with behavioral health clinician on : Pt was encouraged to contact LCSWA if symptoms worsen or fail to improve to schedule behavioral appointments at [facility]. 2. Behavioral recommendations: LCSWA recommends that pt apply healthy coping skills discussed, comply with medication management, and utilize provided resources. Pt is encouraged to schedule follow up appointment with LCSWA 3. Referral(s): Armed forces logistics/support/administrative officer (LME/Outside Clinic) and Community Resources:  Finances 4. "From scale of 1-10, how likely are you to follow plan?": 8/10  Rebekah Chesterfield, LCSW 06/23/17 3:35 PM

## 2017-06-23 NOTE — Assessment & Plan Note (Signed)
Longstanding anxiety and depression triggered by poor health, med non compliance, lack of income and lack of health insurance Start lexapro 10 mg daily Referral to clinical social worker

## 2017-06-23 NOTE — Progress Notes (Addendum)
Subjective:  Patient ID: Angela [name], female    DOB: 23-Feb-1986  Age: 31 y.o. MRN: [id number]  CC: Diabetes   HPI Shritha Bresee has diabetes type 1 with CKD, hypothyroidism, HTN she  presents for    1. CHRONIC DIABETES insulin dependent type 1 with complications of CKD and  Disease Monitoring  Blood Sugar Ranges:   Fasting: 117-220  Postprandial:  High 100s and low 200s   Polyuria: no   Visual problem: yes, worse at night. Unable to dive at night.   Medication Compliance: yes, sliding scale, 25 U at night of toujeo  Medication Side Effects  Hypoglycemia: no   Preventitive Health Care  Eye Exam: due   Foot Exam: done today  Diet pattern: decreased appetite x 2 weeks following recent break up  Exercise: no    2. Hypothyroidism: not taking synthroid for the past 2 months. No swelling in legs. She has depressed mood that worsened 2 weeks ago following the break up with her girlfriend.   3. HTN: not taking Norvasc 5 mg. She denies HA, CP or SOB. She admits to lightheadedness that occurs often. No syncope.   4. CKD stage 3: She missed f/u appt with her nephrologist. She has chronic pain in L flank. She denies hematuria and dysuria. Denies leg swelling.   5. Depression: chronic. She is unemployed. She lost her last job due to sickness related to her uncontrolled diabetes. She reports trouble sleeping. Excessive fatigue. Suicidal thoughts but no plan. She reports her symptoms worsened after her girlfriend who lives in [place], [place] broke up with her 2 weeks ago. She admits to smoking marijuana. Denies cigarettes and illicit drugs.   6. R side pain: x 2 months. Feels a swelling in her R side as well. No rash or skin changes. No trauma.   Social History  Substance Use Topics  . Smoking status: Former Smoker    Packs/day: 0.25    Years: 2.00    Types: Cigarettes    Quit date: 03/04/2013  . Smokeless tobacco: Never Used  . Alcohol use No    Outpatient Medications Prior to  Visit  Medication Sig Dispense Refill  . acetaminophen-codeine (TYLENOL #3) 300-30 MG tablet Take 1 tablet by mouth every 8 (eight) hours as needed for moderate pain. 60 tablet 0  . amLODipine (NORVASC) 5 MG tablet Take 1 tablet (5 mg total) by mouth daily. 30 tablet 3  . gabapentin (NEURONTIN) 300 MG capsule Take 2 capsules (600 mg total) by mouth at bedtime. 60 capsule 2  . hydrOXYzine (ATARAX/VISTARIL) 25 MG tablet Take 1 tablet (25 mg total) by mouth at bedtime as needed. 30 tablet 2  . insulin aspart (NOVOLOG) 100 UNIT/ML injection Inject 15 Units into the skin 3 (three) times daily with meals. 0-15 Units, Subcutaneous, 3 times daily with meals CBG < 70: Call MD CBG 70 - 120: 0 units CBG 121 - 150: 2 units CBG 151 - 200: 3 units CBG 201 - 250: 5 units CBG 251 - 300: 8 units CBG 301 - 350: 11 units CBG 351 - 400: 15 units CBG > 400: call MD 10 mL 1  . Insulin Glargine (TOUJEO SOLOSTAR) 300 UNIT/ML SOPN Inject 40 Units into the skin at bedtime. 6 mL 3  . levothyroxine (SYNTHROID, LEVOTHROID) 200 MCG tablet Take 1 tablet (200 mcg total) by mouth daily before breakfast. 30 tablet 5   Facility-Administered Medications Prior to Visit  Medication Dose Route Frequency Provider Last Rate Last Dose  .  0.9 %  sodium chloride infusion   Intravenous Continuous Amao, Enobong, MD 150 mL/hr at 01/05/16 1520 1,000 mL at 01/05/16 1520  . insulin aspart (novoLOG) injection 10 Units  10 Units Subcutaneous Once Arnoldo Morale, MD        ROS Review of Systems  Constitutional: Positive for fatigue. Negative for chills and fever.  Eyes: Negative for visual disturbance.  Respiratory: Negative for shortness of breath.   Cardiovascular: Negative for chest pain.  Gastrointestinal: Negative for abdominal pain and blood in stool.  Genitourinary: Positive for flank pain. Negative for pelvic pain.  Musculoskeletal: Negative for arthralgias.  Allergic/Immunologic: Negative for immunocompromised state.    Neurological: Positive for light-headedness.  Hematological: Negative for adenopathy. Does not bruise/bleed easily.  Psychiatric/Behavioral: Positive for dysphoric mood and suicidal ideas. The patient is nervous/anxious.     Objective:  BP (!) 144/95   Pulse 85   Temp 97.9 F (36.6 C) (Oral)   Ht 5\' 5"  (1.651 m)   Wt 134 lb 9.6 oz (61.1 kg)   LMP 06/12/2017 (Approximate)   SpO2 100%   BMI 22.40 kg/m   BP/Weight 06/23/2017 5/72/6203 04/22/9740  Systolic BP 638 94 453  Diastolic BP 95 62 76  Wt. (Lbs) 134.6 144.6 146  BMI 22.4 24.06 -   Physical Exam  Constitutional: She is oriented to person, place, and time. She appears well-developed and well-nourished. No distress.  HENT:  Head: Normocephalic and atraumatic.  Cardiovascular: Normal rate, regular rhythm, normal heart sounds and intact distal pulses.   Pulmonary/Chest: Effort normal and breath sounds normal.  Abdominal: Soft. She exhibits no distension and no mass. There is no tenderness. There is CVA tenderness (L ). There is no rebound and no guarding.    Musculoskeletal: She exhibits no edema.       Feet:  Neurological: She is alert and oriented to person, place, and time.  Skin: Skin is warm and dry. No rash noted.  Psychiatric: She exhibits a depressed mood.    Lab Results  Component Value Date   HGBA1C 8.9 12/30/2016   Lab Results  Component Value Date   HGBA1C 11.5 06/23/2017   Lab Results  Component Value Date   TSH 5.89 (H) 12/30/2016    CBG 187   Depression screen [facility] 2/9 06/23/2017 12/30/2016 02/04/2016  Decreased Interest 1 1 2   Down, Depressed, Hopeless 3 1 2   PHQ - 2 Score 4 2 4   Altered sleeping 3 1 2   Tired, decreased energy 1 1 2   Change in appetite 1 2 2   Feeling bad or failure about yourself  3 1 1   Trouble concentrating - 1 2  Moving slowly or fidgety/restless 2 1 2   Suicidal thoughts 1 0 2  PHQ-9 Score 15 9 17   Some recent data might be hidden   GAD 7 : Generalized Anxiety Score  06/23/2017 12/30/2016 02/04/2016 01/12/2016  Nervous, Anxious, on Edge 3 2 2 1   Control/stop worrying 3 3 2 1   Worry too much - different things 3 3 2 3   Trouble relaxing 3 1 2 2   Restless 3 1 2 2   Easily annoyed or irritable 3 1 2  -  Afraid - awful might happen 3 1 1 1   Total GAD 7 Score 21 12 13  -     Assessment & Plan:  Saleha was seen today for diabetes.  Diagnoses and all orders for this visit:  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, with long-term current use of insulin ([place]) -  HgB A1c -     Glucose (CBG) -     POCT UA - Microalbumin -     Lipid panel -     CMP and Liver -     Discontinue: Insulin Glargine (TOUJEO SOLOSTAR) 300 UNIT/ML SOPN; Inject 40 Units into the skin at bedtime. -     insulin aspart (NOVOLOG) 100 UNIT/ML injection; Inject 15 Units into the skin 3 (three) times daily with meals. 0-15 Units, Subcutaneous, 3 times daily with meals CBG < 70: Call MD CBG 70 - 120: 0 units CBG 121 - 150: 2 units CBG 151 - 200: 3 units CBG 201 - 250: 5 units CBG 251 - 300: 8 units CBG 301 - 350: 11 units CBG 351 - 400: 15 units CBG > 400: call MD -     Insulin Glargine (TOUJEO SOLOSTAR) 300 UNIT/ML SOPN; Inject 40 Units into the skin at bedtime.  Hypothyroidism, unspecified type -     TSH -     Discontinue: levothyroxine (SYNTHROID, LEVOTHROID) 200 MCG tablet; Take 1 tablet (200 mcg total) by mouth daily before breakfast. -     levothyroxine (SYNTHROID, LEVOTHROID) 200 MCG tablet; Take 1 tablet (200 mcg total) by mouth daily before breakfast.  Essential hypertension -     Discontinue: amLODipine (NORVASC) 5 MG tablet; Take 1 tablet (5 mg total) by mouth daily. -     amLODipine (NORVASC) 5 MG tablet; Take 1 tablet (5 mg total) by mouth daily.  Anxiety and depression -     Discontinue: escitalopram (LEXAPRO) 10 MG tablet; Take 1 tablet (10 mg total) by mouth daily. -     Ambulatory referral to Psychology -     escitalopram (LEXAPRO) 10 MG tablet; Take 1  tablet (10 mg total) by mouth daily.  Pain of left heel -     Ambulatory referral to Podiatry   There are no diagnoses linked to this encounter.  No orders of the defined types were placed in this encounter.   Follow-up: Return in about 3 weeks (around 07/14/2017) for depression.   Boykin Nearing MD

## 2017-06-23 NOTE — Patient Instructions (Addendum)
Navi was seen today for diabetes.  Diagnoses and all orders for this visit:  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, with long-term current use of insulin (HCC) -     HgB A1c -     Glucose (CBG) -     POCT UA - Microalbumin -     Lipid panel -     CMP and Liver -     Insulin Glargine (TOUJEO SOLOSTAR) 300 UNIT/ML SOPN; Inject 40 Units into the skin at bedtime. -     insulin aspart (NOVOLOG) 100 UNIT/ML injection; Inject 15 Units into the skin 3 (three) times daily with meals. 0-15 Units, Subcutaneous, 3 times daily with meals CBG < 70: Call MD CBG 70 - 120: 0 units CBG 121 - 150: 2 units CBG 151 - 200: 3 units CBG 201 - 250: 5 units CBG 251 - 300: 8 units CBG 301 - 350: 11 units CBG 351 - 400: 15 units CBG > 400: call MD  Hypothyroidism, unspecified type -     TSH -     levothyroxine (SYNTHROID, LEVOTHROID) 200 MCG tablet; Take 1 tablet (200 mcg total) by mouth daily before breakfast.  Essential hypertension -     amLODipine (NORVASC) 5 MG tablet; Take 1 tablet (5 mg total) by mouth daily.  Anxiety and depression -     escitalopram (LEXAPRO) 10 MG tablet; Take 1 tablet (10 mg total) by mouth daily. -     Ambulatory referral to Psychology  Pain of left heel -     Ambulatory referral to Podiatry   Diabetes blood sugar goals  Fasting (in AM before breakfast, 8 hrs of no eating or drinking (except water or unsweetened coffee or tea): 90-130 2 hrs after meals: < 160,   No low sugars: nothing < 70     You have a nephrology appointment on Tuesday June 27, 2017 at 3:40 PM  Dr. Randie Heinz Please arrive 15 minutes     [facility] [place], [place] [id number]  [phone]   F/u in 3 weeks for depression and blood sugar check   Dr. Adrian Blackwater

## 2017-06-23 NOTE — Assessment & Plan Note (Signed)
A: uncontrolled type 1 diabetes Med: compliant P: Increase toujeo from 25 U to 40 U daily

## 2017-06-24 LAB — CMP AND LIVER
ALBUMIN: 4.2 g/dL (ref 3.5–5.5)
ALT: 20 IU/L (ref 0–32)
AST: 24 IU/L (ref 0–40)
Alkaline Phosphatase: 78 IU/L (ref 39–117)
BILIRUBIN TOTAL: 0.2 mg/dL (ref 0.0–1.2)
BUN: 40 mg/dL — ABNORMAL HIGH (ref 6–20)
Bilirubin, Direct: 0.07 mg/dL (ref 0.00–0.40)
CALCIUM: 9.7 mg/dL (ref 8.7–10.2)
CHLORIDE: 97 mmol/L (ref 96–106)
CO2: 24 mmol/L (ref 20–29)
Creatinine, Ser: 2.06 mg/dL — ABNORMAL HIGH (ref 0.57–1.00)
GFR, EST AFRICAN AMERICAN: 36 mL/min/{1.73_m2} — AB (ref >59)
GFR, EST NON AFRICAN AMERICAN: 31 mL/min/{1.73_m2} — AB (ref >59)
GLUCOSE: 191 mg/dL — AB (ref 65–99)
POTASSIUM: 4.9 mmol/L (ref 3.5–5.2)
Sodium: 134 mmol/L (ref 134–144)
TOTAL PROTEIN: 6.8 g/dL (ref 6.0–8.5)

## 2017-06-24 LAB — LIPID PANEL
Chol/HDL Ratio: 2.9 ratio (ref 0.0–4.4)
Cholesterol, Total: 254 mg/dL — ABNORMAL HIGH (ref 100–199)
HDL: 87 mg/dL (ref >39)
LDL Calculated: 142 mg/dL — ABNORMAL HIGH (ref 0–99)
Triglycerides: 125 mg/dL (ref 0–149)
VLDL Cholesterol Cal: 25 mg/dL (ref 5–40)

## 2017-06-24 LAB — TSH: TSH: 136.7 u[IU]/mL — ABNORMAL HIGH (ref 0.450–4.500)

## 2017-06-28 ENCOUNTER — Other Ambulatory Visit: Payer: Self-pay

## 2017-06-28 ENCOUNTER — Telehealth: Payer: Self-pay

## 2017-06-28 NOTE — Telephone Encounter (Signed)
Pt was called and a VM was left informing pt to return phone call for lab results. If pt return phone call please inform pt:   Total cholesterol and LDL is elevated  Cardiovascular disease risk factors are HTN, diabetes and Chronic kidney disease  Kidney function has improved since 02/2017  TSH is elevated as suspected off synthroid, please restart synthroid medication refilled

## 2017-07-15 ENCOUNTER — Emergency Department (HOSPITAL_COMMUNITY): Admission: EM | Admit: 2017-07-15 | Payer: Self-pay | Source: Home / Self Care

## 2017-07-17 ENCOUNTER — Other Ambulatory Visit: Payer: Self-pay | Admitting: Family Medicine

## 2017-07-17 ENCOUNTER — Ambulatory Visit: Payer: Self-pay | Attending: Family Medicine | Admitting: Family Medicine

## 2017-07-17 ENCOUNTER — Ambulatory Visit: Payer: Self-pay

## 2017-07-17 DIAGNOSIS — E039 Hypothyroidism, unspecified: Secondary | ICD-10-CM

## 2017-07-17 DIAGNOSIS — E1029 Type 1 diabetes mellitus with other diabetic kidney complication: Secondary | ICD-10-CM

## 2017-07-18 ENCOUNTER — Ambulatory Visit: Payer: Self-pay | Attending: Family Medicine | Admitting: Family Medicine

## 2017-07-18 ENCOUNTER — Telehealth: Payer: Self-pay | Admitting: Family Medicine

## 2017-07-18 ENCOUNTER — Ambulatory Visit (HOSPITAL_COMMUNITY)
Admission: RE | Admit: 2017-07-18 | Discharge: 2017-07-18 | Disposition: A | Discharge status: Home / Self Care | Payer: Self-pay | Source: Ambulatory Visit | Attending: Family Medicine | Admitting: Family Medicine

## 2017-07-18 ENCOUNTER — Encounter: Payer: Self-pay | Admitting: Family Medicine

## 2017-07-18 VITALS — BP 172/99 | HR 92 | Temp 97.8°F | Resp 16 | Wt 130.2 lb

## 2017-07-18 DIAGNOSIS — Z794 Long term (current) use of insulin: Secondary | ICD-10-CM | POA: Insufficient documentation

## 2017-07-18 DIAGNOSIS — E039 Hypothyroidism, unspecified: Secondary | ICD-10-CM

## 2017-07-18 DIAGNOSIS — R109 Unspecified abdominal pain: Secondary | ICD-10-CM

## 2017-07-18 DIAGNOSIS — R599 Enlarged lymph nodes, unspecified: Secondary | ICD-10-CM

## 2017-07-18 DIAGNOSIS — Z114 Encounter for screening for human immunodeficiency virus [HIV]: Secondary | ICD-10-CM

## 2017-07-18 DIAGNOSIS — D631 Anemia in chronic kidney disease: Secondary | ICD-10-CM

## 2017-07-18 DIAGNOSIS — E1022 Type 1 diabetes mellitus with diabetic chronic kidney disease: Secondary | ICD-10-CM | POA: Insufficient documentation

## 2017-07-18 DIAGNOSIS — I129 Hypertensive chronic kidney disease with stage 1 through stage 4 chronic kidney disease, or unspecified chronic kidney disease: Secondary | ICD-10-CM | POA: Insufficient documentation

## 2017-07-18 DIAGNOSIS — N1831 Chronic kidney disease, stage 3a: Secondary | ICD-10-CM

## 2017-07-18 DIAGNOSIS — Z87891 Personal history of nicotine dependence: Secondary | ICD-10-CM | POA: Insufficient documentation

## 2017-07-18 DIAGNOSIS — S00531A Contusion of lip, initial encounter: Secondary | ICD-10-CM | POA: Insufficient documentation

## 2017-07-18 DIAGNOSIS — I10 Essential (primary) hypertension: Secondary | ICD-10-CM

## 2017-07-18 DIAGNOSIS — R319 Hematuria, unspecified: Secondary | ICD-10-CM

## 2017-07-18 DIAGNOSIS — E1065 Type 1 diabetes mellitus with hyperglycemia: Secondary | ICD-10-CM | POA: Insufficient documentation

## 2017-07-18 DIAGNOSIS — E1029 Type 1 diabetes mellitus with other diabetic kidney complication: Secondary | ICD-10-CM

## 2017-07-18 DIAGNOSIS — N179 Acute kidney failure, unspecified: Secondary | ICD-10-CM

## 2017-07-18 DIAGNOSIS — R591 Generalized enlarged lymph nodes: Secondary | ICD-10-CM | POA: Insufficient documentation

## 2017-07-18 DIAGNOSIS — N183 Chronic kidney disease, stage 3 unspecified: Secondary | ICD-10-CM

## 2017-07-18 DIAGNOSIS — N200 Calculus of kidney: Secondary | ICD-10-CM | POA: Insufficient documentation

## 2017-07-18 DIAGNOSIS — X58XXXA Exposure to other specified factors, initial encounter: Secondary | ICD-10-CM | POA: Insufficient documentation

## 2017-07-18 DIAGNOSIS — Z79899 Other long term (current) drug therapy: Secondary | ICD-10-CM | POA: Insufficient documentation

## 2017-07-18 DIAGNOSIS — E875 Hyperkalemia: Secondary | ICD-10-CM | POA: Insufficient documentation

## 2017-07-18 LAB — CMP14+EGFR
ALBUMIN: 4.3 g/dL (ref 3.5–5.5)
ALT: 35 IU/L — ABNORMAL HIGH (ref 0–32)
AST: 43 IU/L — ABNORMAL HIGH (ref 0–40)
Albumin/Globulin Ratio: 1.7 (ref 1.2–2.2)
Alkaline Phosphatase: 112 IU/L (ref 39–117)
BILIRUBIN TOTAL: 0.2 mg/dL (ref 0.0–1.2)
BUN / CREAT RATIO: 15 (ref 9–23)
BUN: 45 mg/dL — AB (ref 6–20)
CHLORIDE: 96 mmol/L (ref 96–106)
CO2: 17 mmol/L — ABNORMAL LOW (ref 20–29)
CREATININE: 3 mg/dL — AB (ref 0.57–1.00)
Calcium: 9.5 mg/dL (ref 8.7–10.2)
GFR calc non Af Amer: 20 mL/min/{1.73_m2} — ABNORMAL LOW (ref >59)
GFR, EST AFRICAN AMERICAN: 23 mL/min/{1.73_m2} — AB (ref >59)
GLUCOSE: 524 mg/dL — AB (ref 65–99)
Globulin, Total: 2.5 g/dL (ref 1.5–4.5)
Potassium: 4.5 mmol/L (ref 3.5–5.2)
Sodium: 133 mmol/L — ABNORMAL LOW (ref 134–144)
TOTAL PROTEIN: 6.8 g/dL (ref 6.0–8.5)

## 2017-07-18 LAB — TSH: TSH: 1.07 u[IU]/mL (ref 0.450–4.500)

## 2017-07-18 LAB — POCT URINALYSIS DIPSTICK
BILIRUBIN UA: NEGATIVE
Glucose, UA: 250
LEUKOCYTES UA: NEGATIVE
Nitrite, UA: NEGATIVE
Protein, UA: >=300
SPEC GRAV UA: 1.015 (ref 1.010–1.025)
Urobilinogen, UA: 0.2 E.U./dL
pH, UA: 6 (ref 5.0–8.0)

## 2017-07-18 LAB — GLUCOSE, POCT (MANUAL RESULT ENTRY): POC Glucose: 199 mg/dl — AB (ref 70–99)

## 2017-07-18 MED ORDER — AMLODIPINE BESYLATE 10 MG PO TABS: 10.0000 mg | ORAL_TABLET | Freq: Every day | ORAL | 3 refills | Status: DC

## 2017-07-18 MED ORDER — SODIUM CHLORIDE 0.9 % IV BOLUS (SEPSIS): 1000.0000 mL | Freq: Once | INTRAVENOUS | Status: DC

## 2017-07-18 MED FILL — AMLODIPINE BESYLATE 10 MG T: 10 | 30 days supply | Qty: 30 | Fill #0

## 2017-07-18 NOTE — Telephone Encounter (Signed)
Called to patient Verified name Gave labs results Normal TSH High sugar on CMP She reports CBG was 324 last night She feels heaviness and fatigue in her legs She noted swollen bump on collar bone one week ago and another one 2-3 days ago  Plan: CXR Come in for visit, will work in her with me She agrees with plan and voices understanding

## 2017-07-18 NOTE — Patient Instructions (Addendum)
Angela [name] was seen today for follow-up.  Diagnoses and all orders for this visit:  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, with long-term current use of insulin (HCC) -     POCT glucose (manual entry) -     Urinalysis Dipstick  AKI (acute kidney injury) ([place]) -     sodium chloride 0.9 % bolus 1,000 mL; Inject 1,000 mLs into the vein once. -     Insert peripheral IV -     Urinalysis Dipstick -     Urine Culture -     CT RENAL STONE STUDY; Future  Screening for HIV (human immunodeficiency virus) -     HIV antibody (with reflex)  Chronic kidney disease (CKD) stage G3a/A3, moderately decreased glomerular filtration rate (GFR) between 45-59 mL/min/1.73 square meter and albuminuria creatinine ratio greater than 300 mg/g -     CBC  Essential hypertension -     amLODipine (NORVASC) 10 MG tablet; Take 1 tablet (10 mg total) by mouth daily.  Hematuria, unspecified type -     Cancel: CT RENAL STONE STUDY; Future -     CT RENAL STONE STUDY; Future  Right flank pain -     CT RENAL STONE STUDY; Future  There is large blood in the urine I am concerned for right sided  renal stone , please complete CT renal stone study ordered  We were unable to place the IV for fluids today Your fasting sugar is elevated Your labs reveal worsening renal function  Please drink plenty of water Increase Norvasc to 10 mg daily to reduce blood pressure and facilitate renal stone passage if present  Avoid alls NSAIDs- ibuprofen, aleve   You will be called with results.   F/u in 2 weeks for diabetes and hematuria   Dr. Adrian Blackwater
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
## 2017-07-18 NOTE — Telephone Encounter (Signed)
Received a call with a critical Gluc of 524.

## 2017-07-19 LAB — CBC
HEMATOCRIT: 26.2 % — AB (ref 34.0–46.6)
HEMOGLOBIN: 8.3 g/dL — AB (ref 11.1–15.9)
MCH: 27.9 pg (ref 26.6–33.0)
MCHC: 31.7 g/dL (ref 31.5–35.7)
MCV: 88 fL (ref 79–97)
Platelets: 147 10*3/uL — ABNORMAL LOW (ref 150–379)
RBC: 2.98 x10E6/uL — AB (ref 3.77–5.28)
RDW: 13.2 % (ref 12.3–15.4)
WBC: 4.8 10*3/uL (ref 3.4–10.8)

## 2017-07-19 LAB — HIV ANTIBODY (ROUTINE TESTING W REFLEX): HIV Screen 4th Generation wRfx: NONREACTIVE

## 2017-07-20 LAB — URINE CULTURE

## 2017-07-22 ENCOUNTER — Emergency Department (HOSPITAL_COMMUNITY)
Admission: EM | Admit: 2017-07-22 | Discharge: 2017-07-22 | Disposition: A | Discharge status: Home / Self Care | Payer: Self-pay | Attending: Emergency Medicine | Admitting: Emergency Medicine

## 2017-07-22 ENCOUNTER — Emergency Department (HOSPITAL_COMMUNITY): Payer: Self-pay

## 2017-07-22 ENCOUNTER — Encounter (HOSPITAL_COMMUNITY): Payer: Self-pay | Admitting: Emergency Medicine

## 2017-07-22 DIAGNOSIS — N183 Chronic kidney disease, stage 3 (moderate): Secondary | ICD-10-CM | POA: Insufficient documentation

## 2017-07-22 DIAGNOSIS — E039 Hypothyroidism, unspecified: Secondary | ICD-10-CM | POA: Insufficient documentation

## 2017-07-22 DIAGNOSIS — J45909 Unspecified asthma, uncomplicated: Secondary | ICD-10-CM | POA: Insufficient documentation

## 2017-07-22 DIAGNOSIS — Y998 Other external cause status: Secondary | ICD-10-CM | POA: Insufficient documentation

## 2017-07-22 DIAGNOSIS — Y939 Activity, unspecified: Secondary | ICD-10-CM | POA: Insufficient documentation

## 2017-07-22 DIAGNOSIS — W228XXA Striking against or struck by other objects, initial encounter: Secondary | ICD-10-CM | POA: Insufficient documentation

## 2017-07-22 DIAGNOSIS — I129 Hypertensive chronic kidney disease with stage 1 through stage 4 chronic kidney disease, or unspecified chronic kidney disease: Secondary | ICD-10-CM | POA: Insufficient documentation

## 2017-07-22 DIAGNOSIS — Z794 Long term (current) use of insulin: Secondary | ICD-10-CM | POA: Insufficient documentation

## 2017-07-22 DIAGNOSIS — Y92009 Unspecified place in unspecified non-institutional (private) residence as the place of occurrence of the external cause: Secondary | ICD-10-CM | POA: Insufficient documentation

## 2017-07-22 DIAGNOSIS — E109 Type 1 diabetes mellitus without complications: Secondary | ICD-10-CM | POA: Insufficient documentation

## 2017-07-22 DIAGNOSIS — S62366A Nondisplaced fracture of neck of fifth metacarpal bone, right hand, initial encounter for closed fracture: Secondary | ICD-10-CM | POA: Insufficient documentation

## 2017-07-22 DIAGNOSIS — S62306A Unspecified fracture of fifth metacarpal bone, right hand, initial encounter for closed fracture: Secondary | ICD-10-CM

## 2017-07-22 DIAGNOSIS — Z87891 Personal history of nicotine dependence: Secondary | ICD-10-CM | POA: Insufficient documentation

## 2017-07-22 DIAGNOSIS — E785 Hyperlipidemia, unspecified: Secondary | ICD-10-CM | POA: Insufficient documentation

## 2017-07-22 MED ORDER — HYDROCODONE-ACETAMINOPHEN 5-325 MG PO TABS
1.0000 | ORAL_TABLET | Freq: Once | ORAL | Status: AC
  Administered 2017-07-22: 1 via ORAL
  Filled 2017-07-22: qty 1

## 2017-07-22 MED ORDER — HYDROCODONE-ACETAMINOPHEN 5-325 MG PO TABS: 1.0000 | ORAL_TABLET | Freq: Four times a day (QID) | ORAL | 0 refills | Status: DC | PRN
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
## 2017-07-22 NOTE — ED Notes (Signed)
Ortho paged. 

## 2017-07-22 NOTE — ED Triage Notes (Signed)
Patient arrives with complaint of right hand injury. States she punched a window and it was too strong. History of injury by similar process before. Right hand appears swollen.

## 2017-07-22 NOTE — ED Notes (Signed)
Ortho at bedside.

## 2017-07-23 NOTE — Assessment & Plan Note (Addendum)
uncontrolled insulin dependent diabetes type 1 with hyperglycemia and weight loss, CKD stage 3 Plan Compliance with insulin Patient has fu with nephrology upcoming. She has anemia Iron studies added on She also needs erythropoiesis-stimulating agent

## 2017-07-23 NOTE — Assessment & Plan Note (Addendum)
hypothyroidism. Euthyroid on 200 mcg synthroid.

## 2017-07-23 NOTE — Assessment & Plan Note (Signed)
A: she is hypertensive with worsening CKD P: Increased norvasc to 10 mg daily

## 2017-07-23 NOTE — Assessment & Plan Note (Signed)
She has R flank pain with hematuria Negative urine culture CT renal stone study done and revealed: IMPRESSION: Stable diffuse mild bladder wall thickening. An inflammatory process is not excluded.  Left nephrolithiasis. No definite left ureteral calculus or evidence of ureteral obstruction.  She has been referred to urology

## 2017-07-25 MED FILL — PRAVASTATIN NA 20 MG TAB: 20 | 30 days supply | Qty: 30 | Fill #0

## 2017-07-27 ENCOUNTER — Telehealth: Payer: Self-pay

## 2017-07-27 NOTE — Telephone Encounter (Signed)
Pt was called and a VM was left informing pt to return phone call for lab results.

## 2017-07-31 ENCOUNTER — Telehealth: Payer: Self-pay | Admitting: Family Medicine

## 2017-07-31 MED FILL — LEVOTHYROXINE 200 MCG TAB: 200 | 30 days supply | Qty: 30 | Fill #0

## 2017-07-31 NOTE — Telephone Encounter (Signed)
Pt called back to review results, please f/up

## 2017-07-31 NOTE — Telephone Encounter (Signed)
Pt was called and informed of lab results. 

## 2017-08-02 MED FILL — DILTIAZEM 24HR ER 120 MG CA: 120 | 30 days supply | Qty: 30 | Fill #0

## 2017-08-08 ENCOUNTER — Encounter: Payer: Self-pay | Admitting: Internal Medicine

## 2017-08-08 ENCOUNTER — Ambulatory Visit: Payer: Self-pay | Attending: Internal Medicine | Admitting: Internal Medicine

## 2017-08-08 VITALS — BP 159/102 | HR 90 | Temp 98.9°F | Resp 16 | Wt 131.6 lb

## 2017-08-08 DIAGNOSIS — G4700 Insomnia, unspecified: Secondary | ICD-10-CM | POA: Insufficient documentation

## 2017-08-08 DIAGNOSIS — D509 Iron deficiency anemia, unspecified: Secondary | ICD-10-CM | POA: Insufficient documentation

## 2017-08-08 DIAGNOSIS — Z9119 Patient's noncompliance with other medical treatment and regimen: Secondary | ICD-10-CM | POA: Insufficient documentation

## 2017-08-08 DIAGNOSIS — Z8249 Family history of ischemic heart disease and other diseases of the circulatory system: Secondary | ICD-10-CM | POA: Insufficient documentation

## 2017-08-08 DIAGNOSIS — Z9889 Other specified postprocedural states: Secondary | ICD-10-CM | POA: Insufficient documentation

## 2017-08-08 DIAGNOSIS — Z79899 Other long term (current) drug therapy: Secondary | ICD-10-CM | POA: Insufficient documentation

## 2017-08-08 DIAGNOSIS — Z87891 Personal history of nicotine dependence: Secondary | ICD-10-CM | POA: Insufficient documentation

## 2017-08-08 DIAGNOSIS — Z8042 Family history of malignant neoplasm of prostate: Secondary | ICD-10-CM | POA: Insufficient documentation

## 2017-08-08 DIAGNOSIS — K219 Gastro-esophageal reflux disease without esophagitis: Secondary | ICD-10-CM | POA: Insufficient documentation

## 2017-08-08 DIAGNOSIS — Z9114 Patient's other noncompliance with medication regimen: Secondary | ICD-10-CM | POA: Insufficient documentation

## 2017-08-08 DIAGNOSIS — D631 Anemia in chronic kidney disease: Secondary | ICD-10-CM | POA: Insufficient documentation

## 2017-08-08 DIAGNOSIS — Z888 Allergy status to other drugs, medicaments and biological substances status: Secondary | ICD-10-CM | POA: Insufficient documentation

## 2017-08-08 DIAGNOSIS — N183 Chronic kidney disease, stage 3 unspecified: Secondary | ICD-10-CM

## 2017-08-08 DIAGNOSIS — Z803 Family history of malignant neoplasm of breast: Secondary | ICD-10-CM | POA: Insufficient documentation

## 2017-08-08 DIAGNOSIS — N319 Neuromuscular dysfunction of bladder, unspecified: Secondary | ICD-10-CM | POA: Insufficient documentation

## 2017-08-08 DIAGNOSIS — I10 Essential (primary) hypertension: Secondary | ICD-10-CM

## 2017-08-08 DIAGNOSIS — Z794 Long term (current) use of insulin: Secondary | ICD-10-CM | POA: Insufficient documentation

## 2017-08-08 DIAGNOSIS — Z833 Family history of diabetes mellitus: Secondary | ICD-10-CM | POA: Insufficient documentation

## 2017-08-08 DIAGNOSIS — E1043 Type 1 diabetes mellitus with diabetic autonomic (poly)neuropathy: Secondary | ICD-10-CM | POA: Insufficient documentation

## 2017-08-08 DIAGNOSIS — G8929 Other chronic pain: Secondary | ICD-10-CM | POA: Insufficient documentation

## 2017-08-08 DIAGNOSIS — Z823 Family history of stroke: Secondary | ICD-10-CM | POA: Insufficient documentation

## 2017-08-08 DIAGNOSIS — E1042 Type 1 diabetes mellitus with diabetic polyneuropathy: Secondary | ICD-10-CM | POA: Insufficient documentation

## 2017-08-08 DIAGNOSIS — E785 Hyperlipidemia, unspecified: Secondary | ICD-10-CM | POA: Insufficient documentation

## 2017-08-08 DIAGNOSIS — F329 Major depressive disorder, single episode, unspecified: Secondary | ICD-10-CM | POA: Insufficient documentation

## 2017-08-08 DIAGNOSIS — E1022 Type 1 diabetes mellitus with diabetic chronic kidney disease: Secondary | ICD-10-CM | POA: Insufficient documentation

## 2017-08-08 DIAGNOSIS — E1029 Type 1 diabetes mellitus with other diabetic kidney complication: Secondary | ICD-10-CM

## 2017-08-08 DIAGNOSIS — F419 Anxiety disorder, unspecified: Secondary | ICD-10-CM | POA: Insufficient documentation

## 2017-08-08 DIAGNOSIS — I129 Hypertensive chronic kidney disease with stage 1 through stage 4 chronic kidney disease, or unspecified chronic kidney disease: Secondary | ICD-10-CM | POA: Insufficient documentation

## 2017-08-08 DIAGNOSIS — E034 Atrophy of thyroid (acquired): Secondary | ICD-10-CM | POA: Insufficient documentation

## 2017-08-08 DIAGNOSIS — Z87440 Personal history of urinary (tract) infections: Secondary | ICD-10-CM | POA: Insufficient documentation

## 2017-08-08 DIAGNOSIS — K3184 Gastroparesis: Secondary | ICD-10-CM | POA: Insufficient documentation

## 2017-08-08 LAB — GLUCOSE, POCT (MANUAL RESULT ENTRY)
POC GLUCOSE: 281 mg/dL — AB (ref 70–99)
POC Glucose: 301 mg/dl — AB (ref 70–99)

## 2017-08-08 MED ORDER — GABAPENTIN 300 MG PO CAPS: 300.0000 mg | ORAL_CAPSULE | Freq: Two times a day (BID) | ORAL | 3 refills | Status: DC

## 2017-08-08 MED ORDER — METOCLOPRAMIDE HCL 5 MG PO TABS: ORAL_TABLET | ORAL | 1 refills | Status: DC

## 2017-08-08 MED ORDER — INSULIN GLARGINE 300 UNIT/ML ~~LOC~~ SOPN: 44.0000 [IU] | PEN_INJECTOR | Freq: Every day | SUBCUTANEOUS | 3 refills | Status: DC

## 2017-08-08 MED ORDER — SODIUM CHLORIDE 0.9 % IV BOLUS (SEPSIS)
1000.0000 mL | Freq: Once | INTRAVENOUS | Status: AC
  Administered 2017-08-08: 1000 mL via INTRAVENOUS

## 2017-08-08 NOTE — Patient Instructions (Signed)
Give appt with Stacy in 1 wk   Increase Toujeo to 44 units once a day. Start Reglan daily. Start Neurontin for neuropathy symptoms.  Ask your kidney doctor whether she wants you on both Norvasc and Cardizem.
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
## 2017-08-09 LAB — IRON,TIBC AND FERRITIN PANEL
FERRITIN: 144 ng/mL (ref 15–150)
Iron Saturation: 15 % (ref 15–55)
Iron: 40 ug/dL (ref 27–159)
TIBC: 261 ug/dL (ref 250–450)
UIBC: 221 ug/dL (ref 131–425)

## 2017-08-17 ENCOUNTER — Ambulatory Visit: Payer: Self-pay | Admitting: Internal Medicine

## 2017-08-24 ENCOUNTER — Emergency Department (HOSPITAL_COMMUNITY): Payer: Self-pay

## 2017-08-24 ENCOUNTER — Inpatient Hospital Stay (HOSPITAL_COMMUNITY): Payer: Self-pay

## 2017-08-24 ENCOUNTER — Inpatient Hospital Stay (HOSPITAL_COMMUNITY)
Admission: EM | Admit: 2017-08-24 | Discharge: 2017-08-26 | DRG: 683 | Disposition: A | Discharge status: Home / Self Care | Payer: Self-pay | Attending: Internal Medicine | Admitting: Internal Medicine

## 2017-08-24 ENCOUNTER — Encounter (HOSPITAL_COMMUNITY): Payer: Self-pay | Admitting: *Deleted

## 2017-08-24 DIAGNOSIS — R0600 Dyspnea, unspecified: Secondary | ICD-10-CM | POA: Diagnosis present

## 2017-08-24 DIAGNOSIS — E785 Hyperlipidemia, unspecified: Secondary | ICD-10-CM | POA: Diagnosis present

## 2017-08-24 DIAGNOSIS — N1831 Chronic kidney disease, stage 3a: Secondary | ICD-10-CM

## 2017-08-24 DIAGNOSIS — Z794 Long term (current) use of insulin: Secondary | ICD-10-CM

## 2017-08-24 DIAGNOSIS — I129 Hypertensive chronic kidney disease with stage 1 through stage 4 chronic kidney disease, or unspecified chronic kidney disease: Principal | ICD-10-CM | POA: Diagnosis present

## 2017-08-24 DIAGNOSIS — E1043 Type 1 diabetes mellitus with diabetic autonomic (poly)neuropathy: Secondary | ICD-10-CM | POA: Diagnosis present

## 2017-08-24 DIAGNOSIS — K219 Gastro-esophageal reflux disease without esophagitis: Secondary | ICD-10-CM | POA: Diagnosis present

## 2017-08-24 DIAGNOSIS — R079 Chest pain, unspecified: Secondary | ICD-10-CM

## 2017-08-24 DIAGNOSIS — N183 Chronic kidney disease, stage 3 (moderate): Secondary | ICD-10-CM | POA: Diagnosis present

## 2017-08-24 DIAGNOSIS — D649 Anemia, unspecified: Secondary | ICD-10-CM | POA: Diagnosis present

## 2017-08-24 DIAGNOSIS — Z823 Family history of stroke: Secondary | ICD-10-CM

## 2017-08-24 DIAGNOSIS — Z820 Family history of epilepsy and other diseases of the nervous system: Secondary | ICD-10-CM

## 2017-08-24 DIAGNOSIS — Z8249 Family history of ischemic heart disease and other diseases of the circulatory system: Secondary | ICD-10-CM

## 2017-08-24 DIAGNOSIS — Z888 Allergy status to other drugs, medicaments and biological substances status: Secondary | ICD-10-CM

## 2017-08-24 DIAGNOSIS — N179 Acute kidney failure, unspecified: Secondary | ICD-10-CM

## 2017-08-24 DIAGNOSIS — D631 Anemia in chronic kidney disease: Secondary | ICD-10-CM | POA: Diagnosis present

## 2017-08-24 DIAGNOSIS — Z9114 Patient's other noncompliance with medication regimen: Secondary | ICD-10-CM

## 2017-08-24 DIAGNOSIS — E1022 Type 1 diabetes mellitus with diabetic chronic kidney disease: Secondary | ICD-10-CM | POA: Diagnosis present

## 2017-08-24 DIAGNOSIS — Z803 Family history of malignant neoplasm of breast: Secondary | ICD-10-CM

## 2017-08-24 DIAGNOSIS — R7989 Other specified abnormal findings of blood chemistry: Secondary | ICD-10-CM

## 2017-08-24 DIAGNOSIS — K3184 Gastroparesis: Secondary | ICD-10-CM | POA: Diagnosis present

## 2017-08-24 DIAGNOSIS — N319 Neuromuscular dysfunction of bladder, unspecified: Secondary | ICD-10-CM | POA: Diagnosis present

## 2017-08-24 DIAGNOSIS — Z833 Family history of diabetes mellitus: Secondary | ICD-10-CM

## 2017-08-24 DIAGNOSIS — Z87891 Personal history of nicotine dependence: Secondary | ICD-10-CM

## 2017-08-24 DIAGNOSIS — E039 Hypothyroidism, unspecified: Secondary | ICD-10-CM | POA: Diagnosis present

## 2017-08-24 DIAGNOSIS — E872 Acidosis: Secondary | ICD-10-CM | POA: Diagnosis present

## 2017-08-24 DIAGNOSIS — Z882 Allergy status to sulfonamides status: Secondary | ICD-10-CM

## 2017-08-24 DIAGNOSIS — E875 Hyperkalemia: Secondary | ICD-10-CM | POA: Diagnosis present

## 2017-08-24 DIAGNOSIS — R945 Abnormal results of liver function studies: Secondary | ICD-10-CM

## 2017-08-24 DIAGNOSIS — J45909 Unspecified asthma, uncomplicated: Secondary | ICD-10-CM | POA: Diagnosis present

## 2017-08-24 LAB — URINALYSIS, ROUTINE W REFLEX MICROSCOPIC
BACTERIA UA: NONE SEEN
Bilirubin Urine: NEGATIVE
Glucose, UA: >=500 mg/dL — AB
HGB URINE DIPSTICK: NEGATIVE
Ketones, ur: NEGATIVE mg/dL
Leukocytes, UA: NEGATIVE
Nitrite: NEGATIVE
PROTEIN: 100 mg/dL — AB
SPECIFIC GRAVITY, URINE: 1.006 (ref 1.005–1.030)
pH: 5 (ref 5.0–8.0)

## 2017-08-24 LAB — CBC
HCT: 21.4 % — ABNORMAL LOW (ref 36.0–46.0)
Hemoglobin: 6.8 g/dL — CL (ref 12.0–15.0)
MCH: 27.2 pg (ref 26.0–34.0)
MCHC: 31.8 g/dL (ref 30.0–36.0)
MCV: 85.6 fL (ref 78.0–100.0)
Platelets: 145 K/uL — ABNORMAL LOW (ref 150–400)
RBC: 2.5 MIL/uL — ABNORMAL LOW (ref 3.87–5.11)
RDW: 12.3 % (ref 11.5–15.5)
WBC: 5 K/uL (ref 4.0–10.5)

## 2017-08-24 LAB — TSH: TSH: 0.059 u[IU]/mL — ABNORMAL LOW (ref 0.350–4.500)

## 2017-08-24 LAB — BASIC METABOLIC PANEL
Anion gap: 8 (ref 5–15)
BUN: 22 mg/dL — AB (ref 6–20)
CO2: 20 mmol/L — ABNORMAL LOW (ref 22–32)
CREATININE: 2.43 mg/dL — AB (ref 0.44–1.00)
Calcium: 8.8 mg/dL — ABNORMAL LOW (ref 8.9–10.3)
Chloride: 105 mmol/L (ref 101–111)
GFR calc Af Amer: 29 mL/min — ABNORMAL LOW (ref >60)
GFR calc non Af Amer: 25 mL/min — ABNORMAL LOW (ref >60)
GLUCOSE: 324 mg/dL — AB (ref 65–99)
POTASSIUM: 5.2 mmol/L — AB (ref 3.5–5.1)
SODIUM: 133 mmol/L — AB (ref 135–145)

## 2017-08-24 LAB — I-STAT TROPONIN, ED: Troponin i, poc: 0 ng/mL (ref 0.00–0.08)

## 2017-08-24 LAB — I-STAT BETA HCG BLOOD, ED (MC, WL, AP ONLY): I-stat hCG, quantitative: <5 m[IU]/mL (ref <5)

## 2017-08-24 LAB — PREPARE RBC (CROSSMATCH)

## 2017-08-24 LAB — CBG MONITORING, ED
Glucose-Capillary: 102 mg/dL — ABNORMAL HIGH (ref 65–99)
Glucose-Capillary: 21 mg/dL — CL (ref 65–99)

## 2017-08-24 LAB — BRAIN NATRIURETIC PEPTIDE: B NATRIURETIC PEPTIDE 5: 86.7 pg/mL (ref 0.0–100.0)

## 2017-08-24 MED ORDER — HEPARIN SODIUM (PORCINE) 5000 UNIT/ML IJ SOLN
5000.0000 [IU] | Freq: Three times a day (TID) | INTRAMUSCULAR | Status: DC
  Administered 2017-08-25 (×2): 5000 [IU] via SUBCUTANEOUS
  Filled 2017-08-24 (×3): qty 1

## 2017-08-24 MED ORDER — INSULIN ASPART 100 UNIT/ML ~~LOC~~ SOLN
10.0000 [IU] | Freq: Once | SUBCUTANEOUS | Status: AC
  Administered 2017-08-24: 10 [IU] via SUBCUTANEOUS
  Filled 2017-08-24: qty 1

## 2017-08-24 MED ORDER — ESCITALOPRAM OXALATE 10 MG PO TABS
10.0000 mg | ORAL_TABLET | Freq: Every day | ORAL | Status: DC
  Administered 2017-08-25 – 2017-08-26 (×3): 10 mg via ORAL
  Filled 2017-08-24 (×3): qty 1

## 2017-08-24 MED ORDER — INSULIN GLARGINE 100 UNIT/ML ~~LOC~~ SOLN
44.0000 [IU] | Freq: Every day | SUBCUTANEOUS | Status: DC
  Filled 2017-08-24: qty 0.44

## 2017-08-24 MED ORDER — INSULIN ASPART 100 UNIT/ML ~~LOC~~ SOLN
0.0000 [IU] | Freq: Three times a day (TID) | SUBCUTANEOUS | Status: DC
Start: 2017-08-25 — End: 2017-08-26
  Administered 2017-08-25: 3 [IU] via SUBCUTANEOUS
  Administered 2017-08-25: 1 [IU] via SUBCUTANEOUS
  Administered 2017-08-26: 3 [IU] via SUBCUTANEOUS
  Administered 2017-08-26: 1 [IU] via SUBCUTANEOUS

## 2017-08-24 MED ORDER — PANTOPRAZOLE SODIUM 40 MG IV SOLR
40.0000 mg | INTRAVENOUS | Status: DC
  Administered 2017-08-25 – 2017-08-26 (×2): 40 mg via INTRAVENOUS
  Filled 2017-08-24 (×2): qty 40

## 2017-08-24 MED ORDER — DILTIAZEM HCL ER COATED BEADS 120 MG PO CP24
120.0000 mg | ORAL_CAPSULE | Freq: Every day | ORAL | Status: DC
  Administered 2017-08-25 – 2017-08-26 (×2): 120 mg via ORAL
  Filled 2017-08-24 (×3): qty 1

## 2017-08-24 MED ORDER — GABAPENTIN 300 MG PO CAPS
300.0000 mg | ORAL_CAPSULE | Freq: Two times a day (BID) | ORAL | Status: DC
  Administered 2017-08-25 – 2017-08-26 (×4): 300 mg via ORAL
  Filled 2017-08-24 (×4): qty 1

## 2017-08-24 MED ORDER — DEXTROSE 50 % IV SOLN
50.0000 mL | Freq: Once | INTRAVENOUS | Status: AC
Start: 2017-08-24 — End: 2017-08-24
  Administered 2017-08-24: 50 mL via INTRAVENOUS
  Filled 2017-08-24: qty 50

## 2017-08-24 MED ORDER — ONDANSETRON HCL 4 MG/2ML IJ SOLN
4.0000 mg | Freq: Four times a day (QID) | INTRAMUSCULAR | Status: DC | PRN
  Administered 2017-08-25 – 2017-08-26 (×3): 4 mg via INTRAVENOUS
  Filled 2017-08-24 (×2): qty 2

## 2017-08-24 MED ORDER — INSULIN ASPART 100 UNIT/ML ~~LOC~~ SOLN: 15.0000 [IU] | Freq: Three times a day (TID) | SUBCUTANEOUS | Status: DC

## 2017-08-24 MED ORDER — PRAVASTATIN SODIUM 20 MG PO TABS
20.0000 mg | ORAL_TABLET | Freq: Every day | ORAL | Status: DC
  Administered 2017-08-25 (×2): 20 mg via ORAL
  Filled 2017-08-24 (×3): qty 1

## 2017-08-24 MED ORDER — HYDROXYZINE HCL 25 MG PO TABS
25.0000 mg | ORAL_TABLET | Freq: Every evening | ORAL | Status: DC | PRN
  Administered 2017-08-25: 25 mg via ORAL

## 2017-08-24 MED ORDER — SODIUM CHLORIDE 0.9 % IV SOLN
Freq: Once | INTRAVENOUS | Status: AC
  Administered 2017-08-24: 21:00:00 via INTRAVENOUS

## 2017-08-24 MED ORDER — LEVOTHYROXINE SODIUM 200 MCG PO TABS
200.0000 ug | ORAL_TABLET | Freq: Every day | ORAL | Status: DC
  Filled 2017-08-24: qty 1

## 2017-08-24 MED ORDER — AMLODIPINE BESYLATE 10 MG PO TABS
10.0000 mg | ORAL_TABLET | Freq: Every day | ORAL | Status: DC
  Administered 2017-08-25 – 2017-08-26 (×3): 10 mg via ORAL
  Filled 2017-08-24: qty 2
  Filled 2017-08-24 (×2): qty 1

## 2017-08-24 NOTE — ED Triage Notes (Signed)
Pt states that she has had bilateral legs swelling and chest pain for several days. Pt states that pain is worse with movements. Pt states that she has had a "wet" cough.
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
## 2017-08-24 NOTE — ED Notes (Signed)
Critical hemoglobin = 6.8. MD notified.

## 2017-08-24 NOTE — ED Notes (Signed)
Blood consent signed for transfusion

## 2017-08-24 NOTE — ED Notes (Signed)
Pt c/o bil. Lower leg swelling, also c/o chest pain off and on.

## 2017-08-24 NOTE — ED Notes (Signed)
Pt in ultrasound at this time

## 2017-08-24 NOTE — ED Notes (Signed)
Admitting MD at bedside.
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
## 2017-08-25 ENCOUNTER — Inpatient Hospital Stay (HOSPITAL_COMMUNITY): Payer: Self-pay

## 2017-08-25 ENCOUNTER — Encounter (HOSPITAL_COMMUNITY): Payer: Self-pay

## 2017-08-25 DIAGNOSIS — R7989 Other specified abnormal findings of blood chemistry: Secondary | ICD-10-CM

## 2017-08-25 DIAGNOSIS — I340 Nonrheumatic mitral (valve) insufficiency: Secondary | ICD-10-CM

## 2017-08-25 DIAGNOSIS — N183 Chronic kidney disease, stage 3 (moderate): Secondary | ICD-10-CM

## 2017-08-25 DIAGNOSIS — M7989 Other specified soft tissue disorders: Secondary | ICD-10-CM

## 2017-08-25 DIAGNOSIS — D631 Anemia in chronic kidney disease: Secondary | ICD-10-CM

## 2017-08-25 LAB — LIPASE, BLOOD: Lipase: 28 U/L (ref 11–51)

## 2017-08-25 LAB — GLUCOSE, CAPILLARY
GLUCOSE-CAPILLARY: 83 mg/dL (ref 65–99)
GLUCOSE-CAPILLARY: 109 mg/dL — AB (ref 65–99)
Glucose-Capillary: 121 mg/dL — ABNORMAL HIGH (ref 65–99)
Glucose-Capillary: 208 mg/dL — ABNORMAL HIGH (ref 65–99)
Glucose-Capillary: 125 mg/dL — ABNORMAL HIGH (ref 65–99)

## 2017-08-25 LAB — CBC
HCT: 26.8 % — ABNORMAL LOW (ref 36.0–46.0)
HEMOGLOBIN: 8.8 g/dL — AB (ref 12.0–15.0)
MCH: 28.2 pg (ref 26.0–34.0)
MCHC: 32.8 g/dL (ref 30.0–36.0)
MCV: 85.9 fL (ref 78.0–100.0)
PLATELETS: 134 10*3/uL — AB (ref 150–400)
RBC: 3.12 MIL/uL — AB (ref 3.87–5.11)
RDW: 13.2 % (ref 11.5–15.5)
WBC: 4.2 10*3/uL (ref 4.0–10.5)

## 2017-08-25 LAB — COMPREHENSIVE METABOLIC PANEL
ALK PHOS: 148 U/L — AB (ref 38–126)
ALT: 51 U/L (ref 14–54)
AST: 45 U/L — AB (ref 15–41)
Albumin: 3.3 g/dL — ABNORMAL LOW (ref 3.5–5.0)
Anion gap: 7 (ref 5–15)
BUN: 18 mg/dL (ref 6–20)
CALCIUM: 8.9 mg/dL (ref 8.9–10.3)
CHLORIDE: 111 mmol/L (ref 101–111)
CO2: 21 mmol/L — AB (ref 22–32)
CREATININE: 2.06 mg/dL — AB (ref 0.44–1.00)
GFR calc Af Amer: 36 mL/min — ABNORMAL LOW (ref >60)
GFR calc non Af Amer: 31 mL/min — ABNORMAL LOW (ref >60)
Glucose, Bld: 115 mg/dL — ABNORMAL HIGH (ref 65–99)
Potassium: 5 mmol/L (ref 3.5–5.1)
SODIUM: 139 mmol/L (ref 135–145)
Total Bilirubin: 0.7 mg/dL (ref 0.3–1.2)
Total Protein: 5.9 g/dL — ABNORMAL LOW (ref 6.5–8.1)

## 2017-08-25 LAB — ECHOCARDIOGRAM COMPLETE
HEIGHTINCHES: 65 in
Weight: 2180.8 oz

## 2017-08-25 LAB — TROPONIN I: Troponin I: <0.03 ng/mL (ref <0.03)

## 2017-08-25 LAB — CBG MONITORING, ED: GLUCOSE-CAPILLARY: 79 mg/dL (ref 65–99)

## 2017-08-25 LAB — PROTIME-INR
INR: 1.07
Prothrombin Time: 13.8 seconds (ref 11.4–15.2)

## 2017-08-25 LAB — OCCULT BLOOD X 1 CARD TO LAB, STOOL
FECAL OCCULT BLD: NEGATIVE
FECAL OCCULT BLD: NEGATIVE

## 2017-08-25 LAB — APTT: aPTT: 36 seconds (ref 24–36)

## 2017-08-25 LAB — D-DIMER, QUANTITATIVE: D-Dimer, Quant: 1.54 ug/mL-FEU — ABNORMAL HIGH (ref 0.00–0.50)

## 2017-08-25 LAB — AST: AST: 61 U/L — ABNORMAL HIGH (ref 15–41)

## 2017-08-25 LAB — ALT: ALT: 61 U/L — ABNORMAL HIGH (ref 14–54)

## 2017-08-25 MED ORDER — SODIUM BICARBONATE 650 MG PO TABS
650.0000 mg | ORAL_TABLET | Freq: Two times a day (BID) | ORAL | Status: DC
  Administered 2017-08-25 – 2017-08-26 (×2): 650 mg via ORAL
  Filled 2017-08-25 (×2): qty 1

## 2017-08-25 MED ORDER — INSULIN GLARGINE 100 UNIT/ML ~~LOC~~ SOLN
35.0000 [IU] | Freq: Every day | SUBCUTANEOUS | Status: DC
  Filled 2017-08-25 (×2): qty 0.35

## 2017-08-25 MED ORDER — LEVOTHYROXINE SODIUM 100 MCG PO TABS
200.0000 ug | ORAL_TABLET | Freq: Every day | ORAL | Status: DC
  Administered 2017-08-25 – 2017-08-26 (×2): 200 ug via ORAL
  Filled 2017-08-25: qty 2

## 2017-08-25 MED ORDER — TRAMADOL HCL 50 MG PO TABS
50.0000 mg | ORAL_TABLET | Freq: Two times a day (BID) | ORAL | Status: DC | PRN
  Administered 2017-08-25: 50 mg via ORAL

## 2017-08-25 MED ORDER — SENNOSIDES-DOCUSATE SODIUM 8.6-50 MG PO TABS
1.0000 | ORAL_TABLET | Freq: Two times a day (BID) | ORAL | Status: DC
  Administered 2017-08-25 – 2017-08-26 (×3): 1 via ORAL
  Filled 2017-08-25 (×3): qty 1

## 2017-08-25 NOTE — Progress Notes (Signed)
**  Preliminary report by tech**  Bilateral lower extremity venous duplex completed. There is no evidence of deep or superficial vein thrombosis involving the right and left lower extremities. All visualized vessels appear patent and compressible. There is no evidence of Baker's cysts bilaterally.  08/25/17 9:37 AM Angela [name] RVT

## 2017-08-25 NOTE — Progress Notes (Signed)
hemolcult  collected

## 2017-08-25 NOTE — Plan of Care (Signed)
Problem: Fluid Volume: Goal: Ability to maintain a balanced intake and output will improve Outcome: Not Applicable Date Met: 63/84/53 Not eating gave Zofran. Plan for Abdominal ultra sound.

## 2017-08-25 NOTE — Progress Notes (Signed)
abdominal u/s complete. No pain in bed resting during report. No Question or concerns.

## 2017-08-25 NOTE — Progress Notes (Signed)
Lab came to draw AM labs while patient was receiving pRBCs. AM labs rescheduled to 0800. Will continue to monitor.

## 2017-08-25 NOTE — Plan of Care (Signed)
Problem: Nutrition: Goal: Adequate nutrition will be maintained Outcome: Not Progressing No breakfast intake

## 2017-08-25 NOTE — Hospital Discharge Follow-Up (Signed)
The patient is known to the [facility]. Attempted to meet with her today to discuss follow up with the [facility] but she was asleep.  Will follow her hospital course and attempt to meet with her at a later time.

## 2017-08-25 NOTE — Progress Notes (Signed)
PROGRESS NOTE  Fenix Ruppe WLS:[id number] DOB: 1986/01/28 DOA: 08/24/2017 PCP: [name], MD  HPI/Recap of past 24 hours:  Feeling " lousy" has multiple complains Roommate at bedside  Assessment/Plan: Active Problems:   Anemia requiring transfusions   Dyspnea   Leg swelling/bilateral hip pain: (presenting symptom) Initial concerns  For new onset of chf, echo pending/venous doppler pending Pelvic CT unremarkable Review her home meds , she is also on multiple drugs that could cause leg swelling including neurontin/norvasc/cardizem.   Dyspnea:  ddx include new onset HF vs. PE vs MSK pain Echo pending, trop negative and keep on tele to r/o ischemic cardiac event. Initial EKG with NSR.  Can't check CTPE due to CKD.   d dimers elevated,however, no hypoxia, no chest pain, hold off vq scan for now.  LE [place] pending.   Mild hyperkalemia: with mild metabolic acidosis,  start bicarb supplemnet  CKD III: cr close to baseline,  ua unremarkable check renal US She is followed with nephrology at [facility]  Type I DMII: continue home insulin dosing with SS insulin  HTN: cont home meds  abd pain/nausea/vomiting:  could be related to above, or due to gastroparesis. Also use marijuana  Mild elevation of lft, she denies alcohol use, will check hepatitis panel and live [place]  Acute on chronic Anemia:  recent ferritin was > 100, likely due to chronic disease,  S/p 2 units of blood in ED, repeat cbc with appropriate improvement No hemolysis  no bleeding per patient, FOBT negative. Consider epo injection at her nephrology office.  Multiple nonspecific complaints, will check esr/crp/ana/rf  Code Status: full  Family Communication: patient and room mate at bedside  Disposition Plan: home in 1-2 days   Consultants:  none  Procedures:  prbc transfusion  Antibiotics:  none   Objective: BP 132/82 (BP Location: Right Arm)   Pulse 95   Temp 98.4 F (36.9 C)  (Oral)   Resp 18   Ht 5' 5"  (1.651 m)   Wt 61.8 kg (136 lb 4.8 oz)   LMP 08/10/2017 (Exact Date)   SpO2 100%   BMI 22.68 kg/m   Intake/Output Summary (Last 24 hours) at 08/25/17 0848 Last data filed at 08/25/17 0831  Gross per 24 hour  Intake             1675 ml  Output                0 ml  Net             1675 ml   Filed Weights   08/25/17 0219  Weight: 61.8 kg (136 lb 4.8 oz)    Exam: Patient is examined daily including today on 08/25/2017, exams remain the same as of yesterday except that has changed    General:  NAD  Cardiovascular: RRR  Respiratory: CTABL  Abdomen: Soft/ND/NT, positive BS  Musculoskeletal: previously documented  Edema has resolved.  Neuro: alert, oriented   Data Reviewed: Basic Metabolic Panel:  Recent Labs Lab 08/24/17 1804  NA 133*  K 5.2*  CL 105  CO2 20*  GLUCOSE 324*  BUN 22*  CREATININE 2.43*  CALCIUM 8.8*   Liver Function Tests:  Recent Labs Lab 08/24/17 2018  AST 61*  ALT 61*    Recent Labs Lab 08/24/17 2018  LIPASE 28   No results for input(s): AMMONIA in the last 168 hours. CBC:  Recent Labs Lab 08/24/17 1804  WBC 5.0  HGB 6.8*  HCT 21.4*  MCV 85.6  PLT 145*   Cardiac Enzymes:   No results for input(s): CKTOTAL, CKMB, CKMBINDEX, TROPONINI in the last 168 hours. BNP (last 3 results)  Recent Labs  08/24/17 2018  BNP 86.7    ProBNP (last 3 results) No results for input(s): PROBNP in the last 8760 hours.  CBG:  Recent Labs Lab 08/24/17 2225 08/24/17 2318 08/25/17 0041 08/25/17 0254 08/25/17 0742  GLUCAP 102* 21* 79 83 121*    No results found for this or any previous visit (from the past 240 hour(s)).   Studies: Dg Chest 2 View  Result Date: 08/24/2017 CLINICAL DATA:  Chest pain and shortness of breath EXAM: CHEST  2 VIEW COMPARISON:  July 31 28 came FINDINGS: The heart size and mediastinal contours are within normal limits. There is no focal infiltrate, pulmonary edema, or pleural  effusion. The visualized skeletal structures are unremarkable. IMPRESSION: No active cardiopulmonary disease. Electronically Signed   By: Abelardo Diesel M.D.   On: 08/24/2017 18:43   Ct Pelvis Wo Contrast  Result Date: 08/24/2017 CLINICAL DATA:  Lower abdominal and pelvic pain with nausea and vomiting for 2 weeks EXAM: CT PELVIS WITHOUT CONTRAST TECHNIQUE: Multidetector CT imaging of the pelvis was performed following the standard protocol without intravenous contrast. COMPARISON:  07/18/2017 FINDINGS: Urinary Tract: Thick-walled urinary bladder. Distal ureters are unremarkable Bowel: Collapsed appearance versus mild wall thickening of the sigmoid colon. Few fluid-filled nonenlarged distal small bowel loops. Vascular/Lymphatic: Non aneurysmal. No significantly enlarged pelvic or inguinal nodes. Reproductive:  No mass or other significant abnormality Other:  No significant free fluid Musculoskeletal: No suspicious bone lesions identified. IMPRESSION: 1. No acute interval changes compared to prior study. Stable mildly thickened appearance of the bladder wall 2. Probable collapsed appearance of the sigmoid colon, less likely mild colitis. Electronically Signed   By: Donavan Foil M.D.   On: 08/24/2017 22:24   US Renal  Result Date: 08/24/2017 CLINICAL DATA:  Acute renal failure. History of hypertension and diabetes. EXAM: RENAL / URINARY TRACT ULTRASOUND COMPLETE COMPARISON:  CT abdomen and pelvis 07/18/2017 FINDINGS: Right Kidney: Length: 11.4 cm. Normal parenchymal thickness. Diffusely increased parenchymal echotexture suggesting medical renal disease. No focal lesions identified. No hydronephrosis. Left Kidney: Length: 11.3 cm. Normal parenchymal thickness. Diffusely increased parenchymal echotexture suggesting medical renal disease. No focal lesions identified. No hydronephrosis. Bladder: No bladder wall thickening or filling defects identified. IMPRESSION: Increase renal parenchymal echotexture bilaterally  consistent with chronic medical renal disease. No hydronephrosis. Electronically Signed   By: Lucienne Capers M.D.   On: 08/24/2017 23:35    Scheduled Meds: . amLODipine  10 mg Oral Daily  . diltiazem  120 mg Oral Daily  . escitalopram  10 mg Oral Daily  . gabapentin  300 mg Oral BID  . heparin  5,000 Units Subcutaneous Q8H  . insulin aspart  0-9 Units Subcutaneous TID WC  . insulin aspart  15 Units Subcutaneous TID WC  . insulin glargine  44 Units Subcutaneous QHS  . levothyroxine  200 mcg Oral QAC breakfast  . pantoprazole (PROTONIX) IV  40 mg Intravenous Q24H  . pravastatin  20 mg Oral q1800  . senna-docusate  1 tablet Oral BID    Continuous Infusions:   Time spent: 35 mins I have personally reviewed and interpreted on  08/25/2017 daily labs, tele strips, imagings as discussed above under data review session and assessment and plans.  I reviewed all nursing notes, Pt notes,   vitals, pertinent old records  I have  discussed plan of care as described above with  patient and family on 08/25/2017   Sigismund Cross MD, PhD  [facility] Pager [phone]. If 7PM-7AM, please contact night-coverage at [email or web address], password [facility] 08/25/2017, 8:48 AM  LOS: 1 day

## 2017-08-25 NOTE — Care Management Note (Signed)
Case Management Note  Patient Details  Name: Valoria Tamburri MRN: [id number] Date of Birth: 04/21/1986  Subjective/Objective:      Anemia             Action/Plan: Patient is independent of her ADL's; goes to the [facility] for primary care; Financial Counselor to see patient.  Expected Discharge Date:   possibly 08/28/2017               Expected Discharge Plan:  Home/Self Care  Discharge planning Services  CM Consult, [facility]  Status of Service:  In process, will continue to follow  Sherrilyn Rist [phone] 08/25/2017, 11:05 AM

## 2017-08-25 NOTE — Evaluation (Signed)
Physical Therapy Evaluation & [place] Patient Details Name: Angela [name] MRN: [id number] DOB: 1986/12/15 Today's Date: 08/25/2017   History of Present Illness  Pt is a 31 yo female with hx of HTN, DMI, CKD who came with cc of leg swelling/pain and dysnpea.  PMH positive for depression, DM, diabetic neuropathy, asthma, CKD III, gastritis, hypothyroid, and HTN.   Clinical Impression  Patient presents with gait abnormality and history of L hip pain that extends into bilateral LE's since fall from hover board in November.  Feel she has symptoms of pelvic dysfunction, though no asymmetry noted today.  Feel she will benefit from outpatient PT for SI pain and could potentially qualify through financial assist to go to [facility].  Further PT deferred to outpatient setting.  Will sign off.     Follow Up Recommendations Outpatient PT    Equipment Recommendations  Cane    Recommendations for Other Services       Precautions / Restrictions Precautions Precautions: Fall Precaution Comments: reports falls at times when legs get too heavy or feel off balance out of shower, etc      Mobility  Bed Mobility Overal bed mobility: Independent                Transfers Overall transfer level: Independent                  Ambulation/Gait Ambulation/Gait assistance: Modified independent (Device/Increase time) Ambulation Distance (Feet): 200 Feet (one seated rest) Assistive device: None Gait Pattern/deviations: Step-through pattern;Decreased stride length;Antalgic     General Gait Details: at times antalgic on R, but improved after seated rest, at times reaching to hold items in hallway and wall rail  Stairs Stairs: Yes Stairs assistance: Supervision Stair Management: One rail Left;Step to pattern;Forwards Number of Stairs: 10 General stair comments: assist for safety, demonstrates safe technique  Wheelchair Mobility    Modified Rankin (Stroke Patients  Only)       Balance Overall balance assessment: Needs assistance   Sitting balance-Leahy Scale: Normal       Standing balance-Leahy Scale: Good                               Pertinent Vitals/Pain Pain Assessment: 0-10 Pain Score: 6  Pain Location: thighs/groin area with ambulation Pain Descriptors / Indicators: Shooting;Pressure Pain Intervention(s): Monitored during session;Limited activity within patient's tolerance    Home Living Family/patient expects to be discharged to:: Private residence Living Arrangements: Non-relatives/Friends (lives with her best friend) Available Help at Discharge: Friend(s);Available PRN/intermittently Type of Home: Apartment Home Access: Stairs to enter Entrance Stairs-Rails: Psychiatric nurse of Steps: 20 Home Layout: Two level;Bed/bath upstairs Home Equipment: None      Prior Function Level of Independence: Independent         Comments: does cooking and cleaning, standing limited to 15 minutes due to LE weakness     Hand Dominance        Extremity/Trunk Assessment        Lower Extremity Assessment Lower Extremity Assessment: Overall WFL for tasks assessed    Cervical / Trunk Assessment Cervical / Trunk Assessment: Other exceptions Cervical / Trunk Exceptions: palpation of pelvic bony prominences demonstrates level pelvis (ASIS, PSIS, iliac crests, ischial tuberosities and pubic rami)  Tender over R pelvic bones and in L ischium with palpable knot  Communication   Communication: No difficulties  Cognition Arousal/Alertness: Awake/alert Behavior During Therapy: [facility] for  tasks assessed/performed Overall Cognitive Status: Within Functional Limits for tasks assessed                                        General Comments      Exercises     Assessment/Plan    PT Assessment All further PT needs can be met in the next venue of care  PT Problem List Decreased activity  tolerance;Decreased balance;Pain;Decreased mobility       PT Treatment Interventions      PT Goals (Current goals can be found in the Care Plan section)  Acute Rehab PT Goals PT Goal Formulation: All assessment and education complete, DC therapy    Frequency     Barriers to discharge        Co-evaluation               AM-PAC PT "6 Clicks" Daily Activity  Outcome Measure Difficulty turning over in bed (including adjusting bedclothes, sheets and blankets)?: None Difficulty moving from lying on back to sitting on the side of the bed? : None Difficulty sitting down on and standing up from a chair with arms (e.g., wheelchair, bedside commode, etc,.)?: None Help needed moving to and from a bed to chair (including a wheelchair)?: None Help needed walking in hospital room?: None Help needed climbing 3-5 steps with a railing? : A Little 6 Click Score: 23    End of Session Equipment Utilized During Treatment: Gait belt Activity Tolerance: Patient tolerated treatment well Patient left: in bed;with family/visitor present;with call bell/phone within reach   PT Visit Diagnosis: Pain;History of falling (Z91.81);Other abnormalities of gait and mobility (R26.89) Pain - Right/Left: Left Pain - part of body: Hip    Time: 0626-9485 PT Time Calculation (min) (ACUTE ONLY): 34 min   Charges:   PT Evaluation $PT Eval Low Complexity: 1 Low PT Treatments $Gait Training: 8-22 mins   PT G CodesMagda Kiel, [place] [phone] 08/25/2017   Reginia Naas 08/25/2017, 5:39 PM

## 2017-08-25 NOTE — Progress Notes (Signed)
  Echocardiogram 2D Echocardiogram has been performed.  Maclaine Ahola L Androw 08/25/2017, 8:52 AM

## 2017-08-25 NOTE — Progress Notes (Signed)
Noted that insulin orders consisted of Novolog 15 units (correction scale 0-15 units TID) and Novolog 0-9 units correction scale TID. Spoke with pharmacist about getting the orders clarified.   Recommend decreasing Lantus to 35 units daily (80% of home dose of 44 units) and decrease Novolog correction scale to SENSITIVE TID & HS.  Titrate dosages as needed.   Harvel Ricks RN BSN CDE Diabetes Coordinator Pager: [phone]  8am-5pm

## 2017-08-26 ENCOUNTER — Other Ambulatory Visit: Payer: Self-pay

## 2017-08-26 DIAGNOSIS — N179 Acute kidney failure, unspecified: Secondary | ICD-10-CM

## 2017-08-26 LAB — GLUCOSE, CAPILLARY
GLUCOSE-CAPILLARY: 212 mg/dL — AB (ref 65–99)
Glucose-Capillary: 121 mg/dL — ABNORMAL HIGH (ref 65–99)

## 2017-08-26 LAB — BPAM RBC
BLOOD PRODUCT EXPIRATION DATE: 201810042359
Blood Product Expiration Date: 201810042359
ISSUE DATE / TIME: 201809070345
ISSUE DATE / TIME: 201809062223
Unit Type and Rh: 5100
Unit Type and Rh: 5100

## 2017-08-26 LAB — BASIC METABOLIC PANEL
Anion gap: 10 (ref 5–15)
BUN: 16 mg/dL (ref 6–20)
CHLORIDE: 106 mmol/L (ref 101–111)
CO2: 21 mmol/L — AB (ref 22–32)
Calcium: 9.1 mg/dL (ref 8.9–10.3)
Creatinine, Ser: 2.06 mg/dL — ABNORMAL HIGH (ref 0.44–1.00)
GFR calc Af Amer: 36 mL/min — ABNORMAL LOW (ref >60)
GFR calc non Af Amer: 31 mL/min — ABNORMAL LOW (ref >60)
GLUCOSE: 163 mg/dL — AB (ref 65–99)
Potassium: 4.4 mmol/L (ref 3.5–5.1)
Sodium: 137 mmol/L (ref 135–145)

## 2017-08-26 LAB — TYPE AND SCREEN
ABO/RH(D): O POS
Antibody Screen: NEGATIVE
UNIT DIVISION: 0
UNIT DIVISION: 0

## 2017-08-26 LAB — CBC
HEMATOCRIT: 28 % — AB (ref 36.0–46.0)
HEMOGLOBIN: 9.3 g/dL — AB (ref 12.0–15.0)
MCH: 28.5 pg (ref 26.0–34.0)
MCHC: 33.2 g/dL (ref 30.0–36.0)
MCV: 85.9 fL (ref 78.0–100.0)
Platelets: 121 10*3/uL — ABNORMAL LOW (ref 150–400)
RBC: 3.26 MIL/uL — AB (ref 3.87–5.11)
RDW: 12.9 % (ref 11.5–15.5)
WBC: 5.3 10*3/uL (ref 4.0–10.5)

## 2017-08-26 LAB — SEDIMENTATION RATE: Sed Rate: 10 mm/hr (ref 0–22)

## 2017-08-26 LAB — C-REACTIVE PROTEIN: CRP: <0.8 mg/dL (ref <1.0)

## 2017-08-26 MED ORDER — PANTOPRAZOLE SODIUM 40 MG PO TBEC: 40.0000 mg | DELAYED_RELEASE_TABLET | Freq: Every day | ORAL | Status: DC

## 2017-08-26 MED ORDER — GI COCKTAIL ~~LOC~~
30.0000 mL | Freq: Once | ORAL | Status: AC
  Administered 2017-08-26: 30 mL via ORAL
  Filled 2017-08-26: qty 30

## 2017-08-26 MED ORDER — SODIUM BICARBONATE 650 MG PO TABS: 650.0000 mg | ORAL_TABLET | Freq: Two times a day (BID) | ORAL | 0 refills | Status: DC

## 2017-08-26 MED ORDER — ACETAMINOPHEN 325 MG PO TABS
650.0000 mg | ORAL_TABLET | Freq: Four times a day (QID) | ORAL | Status: DC | PRN
  Administered 2017-08-26: 650 mg via ORAL
  Filled 2017-08-26: qty 2

## 2017-08-26 NOTE — Discharge Summary (Signed)
Physician Discharge Summary  Angela [name] NIO:[id number] DOB: 09/02/1986 DOA: 08/24/2017  PCP: [name], MD  Admit date: 08/24/2017 Discharge date: 08/26/2017  Recommendations for Outpatient Follow-up:  1. Pt instructed to resume home meds.  Discharge Diagnoses:  Active Problems:   Anemia requiring transfusions   Dyspnea    Discharge Condition: stable; pt insisted she wanted to go home as soon as possible; did not want to take discharge paper.  Diet recommendation: as tolerated   History of present illness:  Per HPI "31 yo female with hx of HTN, DMI, CKD who came with cc of leg swelling/pain and dysnpea. Leg swelling and pain started 4 weeks ago and has been getting worse. The pain is in lower legs which is new but she also has a more chronic pain in the pelvis after a fall two months ago ( she was supposed to get CT lumbar/pelvis but she missed it) and she continues to have issus with hip pain when she walks. Two weeks ago she started having dyspnea with chest pain. Dyspnea is exertional with orthopnea and PND w/o wheezing or significant cough. Chest pain is mainly right side, non radiating and positional but severe. She also has had vomiting/nausea for two weeks with abdominal discomfort that is generalized but she relates that to having gastroparesis. She has no fever but has had some chills. No other complaints."  Hospital Course:   Leg swelling/bilateral hip pain: Pelvic CT unremarkable Continue home meds  Dyspnea:  Trop negative LE doppler negative for acute DVT Feels better today, wants to go home EKG - NSR Cant do CT angio due to CKD  Mild hyperkalemia: with mild metabolic acidosis On bicarb supplementation   CKD III: At baseline   Type I DMII:  Continue insulin per home dosing   HTN: Continue home meds  abd pain/nausea/vomiting:  Due to gastroparesis Feels better, tolerates regular diet  Mild elevation of lft Acute hepatitis panel  negativew  Acute on chronic Anemia:  recent ferritin was >100, likely due to chronic disease,  S/p 2 units of blood in ED, repeat cbc with appropriate improvement No hemolysis No further bleeding reported    Code Status: full  Family Communication: no family at the bedside      Consultants:  None   Procedures:  prbc transfusion   Antibiotics:  None    Signed:  Leisa Lenz, MD  [facility] 08/26/2017, 9:31 AM  Pager #: [phone]  Time spent in minutes: more than 30 minutes  Discharge Exam: Vitals:   08/26/17 0039 08/26/17 0459  BP: 135/73 (!) 141/83  Pulse: 92 (!) 102  Resp: 18 18  Temp: 98.8 F (37.1 C) 98.6 F (37 C)  SpO2: 99% 99%   Vitals:   08/25/17 1100 08/25/17 1925 08/26/17 0039 08/26/17 0459  BP: (!) 141/91 129/87 135/73 (!) 141/83  Pulse: 90 95 92 (!) 102  Resp:  18 18 18   Temp:  98.4 F (36.9 C) 98.8 F (37.1 C) 98.6 F (37 C)  TempSrc:  Oral Oral Oral  SpO2: 100% 96% 99% 99%  Weight:    60.2 kg (132 lb 11.2 oz)  Height:        General: Pt is alert, follows commands appropriately, not in acute distress Cardiovascular: Regular rate and rhythm, S1/S2 + Respiratory: Clear to auscultation bilaterally, no wheezing, no crackles, no rhonchi Abdominal: Soft, non tender, non distended, bowel sounds +, no guarding Extremities: no edema, no cyanosis, pulses palpable bilaterally DP and PT  Neuro: Grossly nonfocal  Discharge Instructions   Allergies as of 08/26/2017      Reactions   Ferumoxytol Itching   Lisinopril Other (See Comments)   hyperkalemia   Sulfamethoxazole Hives, Itching   Trimethoprim Hives      Medication List    TAKE these medications   amLODipine 10 MG tablet Commonly known as:  NORVASC Take 1 tablet (10 mg total) by mouth daily.   diltiazem 120 MG 24 hr capsule Commonly known as:  DILACOR XR Take 120 mg by mouth daily.   escitalopram 10 MG tablet Commonly known as:  LEXAPRO Take 1 tablet (10  mg total) by mouth daily.   gabapentin 300 MG capsule Commonly known as:  NEURONTIN Take 1 capsule (300 mg total) by mouth 2 (two) times daily.   hydrOXYzine 25 MG tablet Commonly known as:  ATARAX/VISTARIL Take 1 tablet (25 mg total) by mouth at bedtime as needed.   insulin aspart 100 UNIT/ML injection Commonly known as:  novoLOG Inject 15 Units into the skin 3 (three) times daily with meals. 0-15 Units, Subcutaneous, 3 times daily with meals CBG < 70: Call MD CBG 70 - 120: 0 units CBG 121 - 150: 2 units CBG 151 - 200: 3 units CBG 201 - 250: 5 units CBG 251 - 300: 8 units CBG 301 - 350: 11 units CBG 351 - 400: 15 units CBG > 400: call MD   Insulin Glargine 300 UNIT/ML Sopn Commonly known as:  TOUJEO SOLOSTAR Inject 44 Units into the skin at bedtime.   levothyroxine 200 MCG tablet Commonly known as:  SYNTHROID, LEVOTHROID Take 1 tablet (200 mcg total) by mouth daily before breakfast.   metoCLOPramide 5 MG tablet Commonly known as:  REGLAN 1 tab Q a.m with breakfast. What changed:  how much to take  how to take this  when to take this  additional instructions   pravastatin 20 MG tablet Commonly known as:  PRAVACHOL Take 20 mg by mouth daily.   sodium bicarbonate 650 MG tablet Take 1 tablet (650 mg total) by mouth 2 (two) times daily.            Discharge Care Instructions        Start     Ordered   08/26/17 0000  sodium bicarbonate 650 MG tablet  2 times daily     08/26/17 0814     Follow-up Information    [name], MD Follow up.   Specialty:  Internal Medicine Contact information: [address] [place] [id number] [phone]            The results of significant diagnostics from this hospitalization (including imaging, microbiology, ancillary and laboratory) are listed below for reference.    Significant Diagnostic Studies: Dg Chest 2 View  Result Date: 08/24/2017 CLINICAL DATA:  Chest pain and shortness of breath EXAM: CHEST   2 VIEW COMPARISON:  July 31 28 came FINDINGS: The heart size and mediastinal contours are within normal limits. There is no focal infiltrate, pulmonary edema, or pleural effusion. The visualized skeletal structures are unremarkable. IMPRESSION: No active cardiopulmonary disease. Electronically Signed   By: Abelardo Diesel M.D.   On: 08/24/2017 18:43   Ct Pelvis Wo Contrast  Result Date: 08/24/2017 CLINICAL DATA:  Lower abdominal and pelvic pain with nausea and vomiting for 2 weeks EXAM: CT PELVIS WITHOUT CONTRAST TECHNIQUE: Multidetector CT imaging of the pelvis was performed following the standard protocol without intravenous contrast. COMPARISON:  07/18/2017 FINDINGS:  Urinary Tract: Thick-walled urinary bladder. Distal ureters are unremarkable Bowel: Collapsed appearance versus mild wall thickening of the sigmoid colon. Few fluid-filled nonenlarged distal small bowel loops. Vascular/Lymphatic: Non aneurysmal. No significantly enlarged pelvic or inguinal nodes. Reproductive:  No mass or other significant abnormality Other:  No significant free fluid Musculoskeletal: No suspicious bone lesions identified. IMPRESSION: 1. No acute interval changes compared to prior study. Stable mildly thickened appearance of the bladder wall 2. Probable collapsed appearance of the sigmoid colon, less likely mild colitis. Electronically Signed   By: Donavan Foil M.D.   On: 08/24/2017 22:24   US Renal  Result Date: 08/24/2017 CLINICAL DATA:  Acute renal failure. History of hypertension and diabetes. EXAM: RENAL / URINARY TRACT ULTRASOUND COMPLETE COMPARISON:  CT abdomen and pelvis 07/18/2017 FINDINGS: Right Kidney: Length: 11.4 cm. Normal parenchymal thickness. Diffusely increased parenchymal echotexture suggesting medical renal disease. No focal lesions identified. No hydronephrosis. Left Kidney: Length: 11.3 cm. Normal parenchymal thickness. Diffusely increased parenchymal echotexture suggesting medical renal disease. No focal  lesions identified. No hydronephrosis. Bladder: No bladder wall thickening or filling defects identified. IMPRESSION: Increase renal parenchymal echotexture bilaterally consistent with chronic medical renal disease. No hydronephrosis. Electronically Signed   By: Lucienne Capers M.D.   On: 08/24/2017 23:35   US Abdomen Limited  Result Date: 08/25/2017 CLINICAL DATA:  Patient with elevated LFTs. EXAM: ULTRASOUND ABDOMEN LIMITED RIGHT UPPER QUADRANT COMPARISON:  Renal ultrasound 08/24/2017; CT abdomen pelvis 07/18/2017 FINDINGS: Gallbladder: No gallstones or wall thickening visualized. No sonographic Murphy sign noted by sonographer. Common bile duct: Diameter: 3 mm Liver: No focal lesion identified. Within normal limits in parenchymal echogenicity. Portal vein is patent on color Doppler imaging with normal direction of blood flow towards the liver. Trace fluid about the superior aspect of the right kidney. IMPRESSION: No cholelithiasis or sonographic evidence for acute cholecystitis. Electronically Signed   By: Lovey Newcomer M.D.   On: 08/25/2017 19:12    Microbiology: No results found for this or any previous visit (from the past 240 hour(s)).   Labs: Basic Metabolic Panel:  Recent Labs Lab 08/24/17 1804 08/25/17 0751 08/26/17 0201  NA 133* 139 137  K 5.2* 5.0 4.4  CL 105 111 106  CO2 20* 21* 21*  GLUCOSE 324* 115* 163*  BUN 22* 18 16  CREATININE 2.43* 2.06* 2.06*  CALCIUM 8.8* 8.9 9.1   Liver Function Tests:  Recent Labs Lab 08/24/17 2018 08/25/17 0751  AST 61* 45*  ALT 61* 51  ALKPHOS  --  148*  BILITOT  --  0.7  PROT  --  5.9*  ALBUMIN  --  3.3*    Recent Labs Lab 08/24/17 2018  LIPASE 28   No results for input(s): AMMONIA in the last 168 hours. CBC:  Recent Labs Lab 08/24/17 1804 08/25/17 0751 08/26/17 0201  WBC 5.0 4.2 5.3  HGB 6.8* 8.8* 9.3*  HCT 21.4* 26.8* 28.0*  MCV 85.6 85.9 85.9  PLT 145* 134* 121*   Cardiac Enzymes:  Recent Labs Lab  08/25/17 0751  TROPONINI <0.03   BNP: BNP (last 3 results)  Recent Labs  08/24/17 2018  BNP 86.7    ProBNP (last 3 results) No results for input(s): PROBNP in the last 8760 hours.  CBG:  Recent Labs Lab 08/25/17 0742 08/25/17 1124 08/25/17 1541 08/25/17 2120 08/26/17 0727  GLUCAP 121* 109* 208* 125* 121*

## 2017-08-26 NOTE — Discharge Instructions (Signed)
Edema Edema is when you have too much fluid in your body or under your skin. Edema may make your legs, feet, and ankles swell up. Swelling is also common in looser tissues, like around your eyes. This is a common condition. It gets more common as you get older. There are many possible causes of edema. Eating too much salt (sodium) and being on your feet or sitting for a long time can cause edema in your legs, feet, and ankles. Hot weather may make edema worse. Edema is usually painless. Your skin may look swollen or shiny. Follow these instructions at home:  Keep the swollen body part raised (elevated) above the level of your heart when you are sitting or lying down.  Do not sit still or stand for a long time.  Do not wear tight clothes. Do not wear garters on your upper legs.  Exercise your legs. This can help the swelling go down.  Wear elastic bandages or support stockings as told by your doctor.  Eat a low-salt (low-sodium) diet to reduce fluid as told by your doctor.  Depending on the cause of your swelling, you may need to limit how much fluid you drink (fluid restriction).  Take over-the-counter and prescription medicines only as told by your doctor. Contact a doctor if:  Treatment is not working.  You have heart, liver, or kidney disease and have symptoms of edema.  You have sudden and unexplained weight gain. Get help right away if:  You have shortness of breath or chest pain.  You cannot breathe when you lie down.  You have pain, redness, or warmth in the swollen areas.  You have heart, liver, or kidney disease and get edema all of a sudden.  You have a fever and your symptoms get worse all of a sudden. Summary  Edema is when you have too much fluid in your body or under your skin.  Edema may make your legs, feet, and ankles swell up. Swelling is also common in looser tissues, like around your eyes.  Raise (elevate) the swollen body part above the level of your  heart when you are sitting or lying down.  Follow your doctor's instructions about diet and how much fluid you can drink (fluid restriction). This information is not intended to replace advice given to you by your health care provider. Make sure you discuss any questions you have with your health care provider. Document Released: 05/23/2008 Document Revised: 12/23/2016 Document Reviewed: 12/23/2016 Elsevier Interactive Patient Education  2017 [employer]  

## 2017-08-26 NOTE — Progress Notes (Addendum)
Pt called RN to room saying could not breathe, checked oxygen and it is 100% RA, oxygen 2l/min given just for the comfort, pt was complaining chest pressure but she said that it was not chest pain just pressure and early morning same event happened and they gave her GI cocktail, paged MD regarding the situation and waiting for the order

## 2017-08-26 NOTE — Progress Notes (Signed)
Order received for GI cocktail. Will give and continue to monitor patient.

## 2017-08-26 NOTE — Discharge Summary (Addendum)
Pt got discharged to home, discharge instructions provided to the patient's friend since pt was refusing to take the discharge paper and willing to go as soon as possible from hospital . She was like "I just want to get out of here". Pt was alright and sleeping quiet but when she heard that she is being discharged, she acted differently. According to her, doctors here are not taking care of her and she wants to go to her Kidney doctor. According to patient, from her admission, she is having chest pressure and nobody is taking care of her, RN paged MD several times regarding her condition and gave GI cocktail at 3pm but patient was refusing to take GI cocktail that time, RN asked her several times that she is still sure that she is not taking her medicine. She took it after convincing by RN.Telemonitor DC,pt left unit in wheelchair with all of the belongings accompanied with her friend but not in a good mood. RN gave her [place] to take with her  Palma Holter, Therapist, sports

## 2017-08-26 NOTE — Progress Notes (Signed)
Patient complaining chest pain/pressure in center of chest non radiating 7/10 that woke her up from sleep.Vital signs obtained temp 98.6 HR 102 RR 18 B/P 141/83 oxygen saturations 99% on room air. Patient vomited 100 cc of light green emesis .12 lead EKG obtained no ekg changes noted.Text paged NP X.Blount awaiting response.

## 2017-08-26 NOTE — Progress Notes (Addendum)
Pt has fever 99, Paged MD for Tylenol which is not in [facility], and gave it to the patient, will recheck temperature shortly, will continue to monitor the patient

## 2017-08-27 LAB — RHEUMATOID FACTOR

## 2017-08-28 ENCOUNTER — Telehealth: Payer: Self-pay

## 2017-08-28 LAB — HEPATITIS PANEL, ACUTE
Hep A IgM: NEGATIVE
Hep B C IgM: NEGATIVE
Hepatitis B Surface Ag: NEGATIVE

## 2017-08-28 LAB — ANTINUCLEAR ANTIBODIES, IFA: ANA Ab, IFA: NEGATIVE

## 2017-08-28 MED FILL — SODIUM BICARB 650 MG TABLET: 650 | 30 days supply | Qty: 60 | Fill #0

## 2017-08-28 NOTE — Telephone Encounter (Signed)
[facility] Post-discharge Follow-Up Phone Call:    Date of Discharge:  08/26/2017 Principal Discharge Diagnosis(es):  Dyspnea, DM, CKD, lower leg pain Post-discharge Communication: (Clearly document all attempts clearly and date contact made) call placed to the patient. Explained the TCC program to her. She currently has an appointment with Dr Wynetta Emery but was very interested in being seen sooner. She was very appreciative of the appointment for 08/30/17 Call Completed: Yes                    With Whom: Patient Interpreter Needed: No     Please check all that apply:  X  Patient is knowledgeable of his/her condition(s) and/or treatment. X  Patient is caring for self at home.  - she lives with a roommate but is independent with ADLs. ? Patient is receiving assist at home from family and/or caregiver. Family and/or caregiver is knowledgeable of patient's condition(s) and/or treatment. ? Patient is receiving home health services. If so, name of agency.     Medication Reconciliation:  ? Medication list reviewed with patient. X  Patient obtained all discharge medications. If not, why? - She stated that she has all of her medications and is taking them as ordered. She stated that she uses [facility].  She reported that she has the new medication list from the hospital and does not have any questions about her medications. Instructed her to bring all of her medications with her to her appointment on 08/30/17. She also noted that she has a glucometer and has been checking her blood sugars. She said that she has not been eating very much because she continues to experience nausea and has vomited twice. She stated that despite not eating, she has not been able to keep he blood sugars below 400. She said that she has some chills but that is normal for her as she is always cold, When first questioned about how she was feeling she stated that she is feeling " fine."  Message sent to Dr Jarold Song  regarding the blood sugars.    Activities of Daily Living:  X  Independent - roommate can help if needed. She has access to food.  ? Needs assist (describe; ? home DME used) ? [facility] (describe, ? home DME used)   Community resources in place for patient:  X  None at present time. ? Home Health/Home DME ? Assisted Living ? Support Group                Questions/Concerns discussed: She confirmed her appointment with Dr Jarold Song for 08/30/17 @ 1400 and also confirmed that she has transportation to the clinic. No other questions/concerns reported at this time.

## 2017-08-29 ENCOUNTER — Telehealth: Payer: Self-pay

## 2017-08-29 MED FILL — METOCLOPRAMIDE 5 MG TABLET: 5 | 30 days supply | Qty: 30 | Fill #0

## 2017-08-29 MED FILL — GABAPENTIN 300 MG CAPSULE: 300 | 30 days supply | Qty: 60 | Fill #0

## 2017-08-29 NOTE — Telephone Encounter (Signed)
Call placed to the patient and informed of the new orders from Dr Jarold Song: increase the long acting insulin (Tujeo) to 50 units at bedtime and also to take Reglan 3 times daily as needed for nausea and vomiting until appointment with Dr Jarold Song.  The patient was able to repeat back the orders correctly.   She said that she took the reglan this morning after taking her levothyroxine and she has not experienced any nausea since then. Her fasting blood sugar this morning was 245 and at lunch time was 305.   She confirmed her appointment for tomorrow - 08/30/17 @ 1400.

## 2017-08-30 ENCOUNTER — Encounter: Payer: Self-pay | Admitting: Family Medicine

## 2017-08-30 ENCOUNTER — Ambulatory Visit: Payer: Self-pay | Attending: Family Medicine | Admitting: Family Medicine

## 2017-08-30 VITALS — BP 169/111 | HR 89 | Temp 98.2°F | Ht 65.0 in | Wt 133.0 lb

## 2017-08-30 DIAGNOSIS — Z9114 Patient's other noncompliance with medication regimen: Secondary | ICD-10-CM | POA: Insufficient documentation

## 2017-08-30 DIAGNOSIS — L219 Seborrheic dermatitis, unspecified: Secondary | ICD-10-CM | POA: Insufficient documentation

## 2017-08-30 DIAGNOSIS — I959 Hypotension, unspecified: Secondary | ICD-10-CM | POA: Insufficient documentation

## 2017-08-30 DIAGNOSIS — G4700 Insomnia, unspecified: Secondary | ICD-10-CM | POA: Insufficient documentation

## 2017-08-30 DIAGNOSIS — I129 Hypertensive chronic kidney disease with stage 1 through stage 4 chronic kidney disease, or unspecified chronic kidney disease: Secondary | ICD-10-CM | POA: Insufficient documentation

## 2017-08-30 DIAGNOSIS — Z9889 Other specified postprocedural states: Secondary | ICD-10-CM | POA: Insufficient documentation

## 2017-08-30 DIAGNOSIS — E1022 Type 1 diabetes mellitus with diabetic chronic kidney disease: Secondary | ICD-10-CM | POA: Insufficient documentation

## 2017-08-30 DIAGNOSIS — D649 Anemia, unspecified: Secondary | ICD-10-CM | POA: Insufficient documentation

## 2017-08-30 DIAGNOSIS — E1043 Type 1 diabetes mellitus with diabetic autonomic (poly)neuropathy: Secondary | ICD-10-CM | POA: Insufficient documentation

## 2017-08-30 DIAGNOSIS — E1029 Type 1 diabetes mellitus with other diabetic kidney complication: Secondary | ICD-10-CM | POA: Insufficient documentation

## 2017-08-30 DIAGNOSIS — E039 Hypothyroidism, unspecified: Secondary | ICD-10-CM | POA: Insufficient documentation

## 2017-08-30 DIAGNOSIS — K219 Gastro-esophageal reflux disease without esophagitis: Secondary | ICD-10-CM | POA: Insufficient documentation

## 2017-08-30 DIAGNOSIS — Z79899 Other long term (current) drug therapy: Secondary | ICD-10-CM | POA: Insufficient documentation

## 2017-08-30 DIAGNOSIS — F329 Major depressive disorder, single episode, unspecified: Secondary | ICD-10-CM | POA: Insufficient documentation

## 2017-08-30 DIAGNOSIS — M7989 Other specified soft tissue disorders: Secondary | ICD-10-CM | POA: Insufficient documentation

## 2017-08-30 DIAGNOSIS — J45909 Unspecified asthma, uncomplicated: Secondary | ICD-10-CM | POA: Insufficient documentation

## 2017-08-30 DIAGNOSIS — Z9119 Patient's noncompliance with other medical treatment and regimen: Secondary | ICD-10-CM | POA: Insufficient documentation

## 2017-08-30 DIAGNOSIS — R109 Unspecified abdominal pain: Secondary | ICD-10-CM

## 2017-08-30 DIAGNOSIS — Z882 Allergy status to sulfonamides status: Secondary | ICD-10-CM | POA: Insufficient documentation

## 2017-08-30 DIAGNOSIS — I10 Essential (primary) hypertension: Secondary | ICD-10-CM

## 2017-08-30 DIAGNOSIS — Z794 Long term (current) use of insulin: Secondary | ICD-10-CM | POA: Insufficient documentation

## 2017-08-30 DIAGNOSIS — E785 Hyperlipidemia, unspecified: Secondary | ICD-10-CM | POA: Insufficient documentation

## 2017-08-30 DIAGNOSIS — Z23 Encounter for immunization: Secondary | ICD-10-CM

## 2017-08-30 DIAGNOSIS — Z888 Allergy status to other drugs, medicaments and biological substances status: Secondary | ICD-10-CM | POA: Insufficient documentation

## 2017-08-30 DIAGNOSIS — K3184 Gastroparesis: Secondary | ICD-10-CM | POA: Insufficient documentation

## 2017-08-30 DIAGNOSIS — E1042 Type 1 diabetes mellitus with diabetic polyneuropathy: Secondary | ICD-10-CM

## 2017-08-30 DIAGNOSIS — N184 Chronic kidney disease, stage 4 (severe): Secondary | ICD-10-CM | POA: Insufficient documentation

## 2017-08-30 LAB — GLUCOSE, POCT (MANUAL RESULT ENTRY): POC Glucose: 116 mg/dl — AB (ref 70–99)

## 2017-08-30 MED ORDER — KETOCONAZOLE 2 % EX SHAM: 1.0000 "application " | MEDICATED_SHAMPOO | CUTANEOUS | 3 refills | Status: DC

## 2017-08-30 MED ORDER — INSULIN ASPART 100 UNIT/ML ~~LOC~~ SOLN: 0.0000 [IU] | Freq: Three times a day (TID) | SUBCUTANEOUS | 3 refills | Status: DC

## 2017-08-30 MED ORDER — INSULIN GLARGINE 300 UNIT/ML ~~LOC~~ SOPN: 60.0000 [IU] | PEN_INJECTOR | Freq: Every day | SUBCUTANEOUS | 3 refills | Status: DC

## 2017-08-30 MED FILL — KETOCONAZOLE 2% SHAMPOO: 2 | 30 days supply | Qty: 120 | Fill #0
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
## 2017-08-31 ENCOUNTER — Other Ambulatory Visit: Payer: Self-pay | Admitting: Family Medicine

## 2017-08-31 DIAGNOSIS — E1029 Type 1 diabetes mellitus with other diabetic kidney complication: Secondary | ICD-10-CM

## 2017-08-31 MED FILL — TOUJEO SOLOSTAR 300 UNITS/M: 300 | 30 days supply | Qty: 5 | Fill #0

## 2017-08-31 MED FILL — !NOVOLOG 100UNITS/ML VIAL: 100/ML | 22 days supply | Qty: 10 | Fill #1

## 2017-09-10 ENCOUNTER — Encounter (HOSPITAL_COMMUNITY): Payer: Self-pay

## 2017-09-10 ENCOUNTER — Emergency Department (HOSPITAL_COMMUNITY)
Admission: EM | Admit: 2017-09-10 | Discharge: 2017-09-10 | Disposition: A | Discharge status: Home / Self Care | Payer: Self-pay | Attending: Emergency Medicine | Admitting: Emergency Medicine

## 2017-09-10 DIAGNOSIS — I129 Hypertensive chronic kidney disease with stage 1 through stage 4 chronic kidney disease, or unspecified chronic kidney disease: Secondary | ICD-10-CM | POA: Insufficient documentation

## 2017-09-10 DIAGNOSIS — E039 Hypothyroidism, unspecified: Secondary | ICD-10-CM | POA: Insufficient documentation

## 2017-09-10 DIAGNOSIS — N183 Chronic kidney disease, stage 3 (moderate): Secondary | ICD-10-CM | POA: Insufficient documentation

## 2017-09-10 DIAGNOSIS — J45909 Unspecified asthma, uncomplicated: Secondary | ICD-10-CM | POA: Insufficient documentation

## 2017-09-10 DIAGNOSIS — Z794 Long term (current) use of insulin: Secondary | ICD-10-CM | POA: Insufficient documentation

## 2017-09-10 DIAGNOSIS — E162 Hypoglycemia, unspecified: Secondary | ICD-10-CM

## 2017-09-10 DIAGNOSIS — E10649 Type 1 diabetes mellitus with hypoglycemia without coma: Secondary | ICD-10-CM | POA: Insufficient documentation

## 2017-09-10 DIAGNOSIS — Z79899 Other long term (current) drug therapy: Secondary | ICD-10-CM | POA: Insufficient documentation

## 2017-09-10 DIAGNOSIS — Z87891 Personal history of nicotine dependence: Secondary | ICD-10-CM | POA: Insufficient documentation

## 2017-09-10 LAB — CBC WITH DIFFERENTIAL/PLATELET
BASOS ABS: 0 10*3/uL (ref 0.0–0.1)
Basophils Relative: 0 %
EOS ABS: 0.1 10*3/uL (ref 0.0–0.7)
EOS PCT: 2 %
HCT: 31.9 % — ABNORMAL LOW (ref 36.0–46.0)
HEMOGLOBIN: 10.1 g/dL — AB (ref 12.0–15.0)
LYMPHS ABS: 1.7 10*3/uL (ref 0.7–4.0)
LYMPHS PCT: 28 %
MCH: 27.2 pg (ref 26.0–34.0)
MCHC: 31.7 g/dL (ref 30.0–36.0)
MCV: 86 fL (ref 78.0–100.0)
Monocytes Absolute: 0.5 10*3/uL (ref 0.1–1.0)
Monocytes Relative: 8 %
NEUTROS PCT: 62 %
Neutro Abs: 3.6 10*3/uL (ref 1.7–7.7)
Platelets: 166 10*3/uL (ref 150–400)
RBC: 3.71 MIL/uL — AB (ref 3.87–5.11)
RDW: 12.3 % (ref 11.5–15.5)
WBC: 5.8 10*3/uL (ref 4.0–10.5)

## 2017-09-10 LAB — BASIC METABOLIC PANEL
ANION GAP: 9 (ref 5–15)
BUN: 35 mg/dL — ABNORMAL HIGH (ref 6–20)
CO2: 20 mmol/L — AB (ref 22–32)
Calcium: 9.5 mg/dL (ref 8.9–10.3)
Chloride: 107 mmol/L (ref 101–111)
Creatinine, Ser: 2.55 mg/dL — ABNORMAL HIGH (ref 0.44–1.00)
GFR calc non Af Amer: 24 mL/min — ABNORMAL LOW (ref >60)
GFR, EST AFRICAN AMERICAN: 28 mL/min — AB (ref >60)
GLUCOSE: 161 mg/dL — AB (ref 65–99)
POTASSIUM: 5.3 mmol/L — AB (ref 3.5–5.1)
Sodium: 136 mmol/L (ref 135–145)

## 2017-09-10 LAB — CBG MONITORING, ED
Glucose-Capillary: 157 mg/dL — ABNORMAL HIGH (ref 65–99)
Glucose-Capillary: 185 mg/dL — ABNORMAL HIGH (ref 65–99)

## 2017-09-10 NOTE — ED Triage Notes (Signed)
Pt from home by [facility] fro hypoglycemia. Pt had a CBG with EMS of 64 and was acting different per family and slurring speech. Pt was given 1 amp of D50 and is back to normal.

## 2017-09-10 NOTE — ED Notes (Signed)
ED Provider at bedside. 
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
## 2017-09-10 NOTE — Discharge Instructions (Signed)
Please follow closely with primary care for discussion of insulin regimen, decrease your long-acting insulin from 60 units before bedtime to 45 units before bedtime.  Do not hesitate to return to the emergency department for any new, worsening or concerning symptoms.

## 2017-09-13 ENCOUNTER — Encounter: Payer: Self-pay | Admitting: Family Medicine

## 2017-09-13 ENCOUNTER — Ambulatory Visit: Payer: Self-pay | Attending: Family Medicine | Admitting: Family Medicine

## 2017-09-13 VITALS — BP 129/82 | HR 102 | Temp 98.2°F | Wt 137.6 lb

## 2017-09-13 DIAGNOSIS — E1022 Type 1 diabetes mellitus with diabetic chronic kidney disease: Secondary | ICD-10-CM | POA: Insufficient documentation

## 2017-09-13 DIAGNOSIS — Z87891 Personal history of nicotine dependence: Secondary | ICD-10-CM | POA: Insufficient documentation

## 2017-09-13 DIAGNOSIS — G4700 Insomnia, unspecified: Secondary | ICD-10-CM | POA: Insufficient documentation

## 2017-09-13 DIAGNOSIS — Z794 Long term (current) use of insulin: Secondary | ICD-10-CM | POA: Insufficient documentation

## 2017-09-13 DIAGNOSIS — R5383 Other fatigue: Secondary | ICD-10-CM

## 2017-09-13 DIAGNOSIS — E10649 Type 1 diabetes mellitus with hypoglycemia without coma: Secondary | ICD-10-CM | POA: Insufficient documentation

## 2017-09-13 DIAGNOSIS — E039 Hypothyroidism, unspecified: Secondary | ICD-10-CM | POA: Insufficient documentation

## 2017-09-13 DIAGNOSIS — N184 Chronic kidney disease, stage 4 (severe): Secondary | ICD-10-CM | POA: Insufficient documentation

## 2017-09-13 DIAGNOSIS — K219 Gastro-esophageal reflux disease without esophagitis: Secondary | ICD-10-CM | POA: Insufficient documentation

## 2017-09-13 DIAGNOSIS — E1040 Type 1 diabetes mellitus with diabetic neuropathy, unspecified: Secondary | ICD-10-CM | POA: Insufficient documentation

## 2017-09-13 DIAGNOSIS — D631 Anemia in chronic kidney disease: Secondary | ICD-10-CM | POA: Insufficient documentation

## 2017-09-13 DIAGNOSIS — F329 Major depressive disorder, single episode, unspecified: Secondary | ICD-10-CM | POA: Insufficient documentation

## 2017-09-13 DIAGNOSIS — N183 Chronic kidney disease, stage 3 unspecified: Secondary | ICD-10-CM

## 2017-09-13 DIAGNOSIS — E1029 Type 1 diabetes mellitus with other diabetic kidney complication: Secondary | ICD-10-CM

## 2017-09-13 DIAGNOSIS — J45909 Unspecified asthma, uncomplicated: Secondary | ICD-10-CM | POA: Insufficient documentation

## 2017-09-13 DIAGNOSIS — N1831 Chronic kidney disease, stage 3a: Secondary | ICD-10-CM

## 2017-09-13 DIAGNOSIS — E1065 Type 1 diabetes mellitus with hyperglycemia: Secondary | ICD-10-CM | POA: Insufficient documentation

## 2017-09-13 DIAGNOSIS — I10 Essential (primary) hypertension: Secondary | ICD-10-CM

## 2017-09-13 DIAGNOSIS — E785 Hyperlipidemia, unspecified: Secondary | ICD-10-CM | POA: Insufficient documentation

## 2017-09-13 DIAGNOSIS — I129 Hypertensive chronic kidney disease with stage 1 through stage 4 chronic kidney disease, or unspecified chronic kidney disease: Secondary | ICD-10-CM | POA: Insufficient documentation

## 2017-09-13 LAB — GLUCOSE, POCT (MANUAL RESULT ENTRY): POC GLUCOSE: 113 mg/dL — AB (ref 70–99)

## 2017-09-13 MED ORDER — INSULIN GLARGINE 300 UNIT/ML ~~LOC~~ SOPN: 50.0000 [IU] | PEN_INJECTOR | Freq: Every day | SUBCUTANEOUS | 3 refills | Status: DC

## 2017-09-13 MED FILL — ?LEVOTHYROXINE 200 MCG TAB: 200 MCG | 30 days supply | Qty: 30 | Fill #1

## 2017-09-13 NOTE — Patient Instructions (Signed)
Diabetes Mellitus and Food It is important for you to manage your blood sugar (glucose) level. Your blood glucose level can be greatly affected by what you eat. Eating healthier foods in the appropriate amounts throughout the day at about the same time each day will help you control your blood glucose level. It can also help slow or prevent worsening of your diabetes mellitus. Healthy eating may even help you improve the level of your blood pressure and reach or maintain a healthy weight. General recommendations for healthful eating and cooking habits include:  Eating meals and snacks regularly. Avoid going long periods of time without eating to lose weight.  Eating a diet that consists mainly of plant-based foods, such as fruits, vegetables, nuts, legumes, and whole grains.  Using low-heat cooking methods, such as baking, instead of high-heat cooking methods, such as deep frying.  Work with your dietitian to make sure you understand how to use the Nutrition Facts information on food labels. How can food affect me? Carbohydrates Carbohydrates affect your blood glucose level more than any other type of food. Your dietitian will help you determine how many carbohydrates to eat at each meal and teach you how to count carbohydrates. Counting carbohydrates is important to keep your blood glucose at a healthy level, especially if you are using insulin or taking certain medicines for diabetes mellitus. Alcohol Alcohol can cause sudden decreases in blood glucose (hypoglycemia), especially if you use insulin or take certain medicines for diabetes mellitus. Hypoglycemia can be a life-threatening condition. Symptoms of hypoglycemia (sleepiness, dizziness, and disorientation) are similar to symptoms of having too much alcohol. If your health care provider has given you approval to drink alcohol, do so in moderation and use the following guidelines:  Women should not have more than one drink per day, and men  should not have more than two drinks per day. One drink is equal to: ? 12 oz of beer. ? 5 oz of wine. ? 1 oz of hard liquor.  Do not drink on an empty stomach.  Keep yourself hydrated. Have water, diet soda, or unsweetened iced tea.  Regular soda, juice, and other mixers might contain a lot of carbohydrates and should be counted.  What foods are not recommended? As you make food choices, it is important to remember that all foods are not the same. Some foods have fewer nutrients per serving than other foods, even though they might have the same number of calories or carbohydrates. It is difficult to get your body what it needs when you eat foods with fewer nutrients. Examples of foods that you should avoid that are high in calories and carbohydrates but low in nutrients include:  Trans fats (most processed foods list trans fats on the Nutrition Facts label).  Regular soda.  Juice.  Candy.  Sweets, such as cake, pie, doughnuts, and cookies.  Fried foods.  What foods can I eat? Eat nutrient-rich foods, which will nourish your body and keep you healthy. The food you should eat also will depend on several factors, including:  The calories you need.  The medicines you take.  Your weight.  Your blood glucose level.  Your blood pressure level.  Your cholesterol level.  You should eat a variety of foods, including:  Protein. ? Lean cuts of meat. ? Proteins low in saturated fats, such as fish, egg whites, and beans. Avoid processed meats.  Fruits and vegetables. ? Fruits and vegetables that may help control blood glucose levels, such as apples,   mangoes, and yams.  Dairy products. ? Choose fat-free or low-fat dairy products, such as milk, yogurt, and cheese.  Grains, bread, pasta, and rice. ? Choose whole grain products, such as multigrain bread, whole oats, and brown rice. These foods may help control blood pressure.  Fats. ? Foods containing healthful fats, such as  nuts, avocado, olive oil, canola oil, and fish.  Does everyone with diabetes mellitus have the same meal plan? Because every person with diabetes mellitus is different, there is not one meal plan that works for everyone. It is very important that you meet with a dietitian who will help you create a meal plan that is just right for you. This information is not intended to replace advice given to you by your health care provider. Make sure you discuss any questions you have with your health care provider. Document Released: 09/01/2005 Document Revised: 05/12/2016 Document Reviewed: 11/01/2013 [employer] Interactive Patient Education  2017 [employer]  
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
## 2017-09-14 LAB — CBC WITH DIFFERENTIAL/PLATELET
BASOS ABS: 0 10*3/uL (ref 0.0–0.2)
BASOS: 0 %
EOS (ABSOLUTE): 0.1 10*3/uL (ref 0.0–0.4)
Eos: 2 %
Hematocrit: 26.8 % — ABNORMAL LOW (ref 34.0–46.6)
Hemoglobin: 8.6 g/dL — ABNORMAL LOW (ref 11.1–15.9)
Immature Grans (Abs): 0 10*3/uL (ref 0.0–0.1)
Immature Granulocytes: 0 %
LYMPHS ABS: 1.6 10*3/uL (ref 0.7–3.1)
Lymphs: 29 %
MCH: 28 pg (ref 26.6–33.0)
MCHC: 32.1 g/dL (ref 31.5–35.7)
MCV: 87 fL (ref 79–97)
MONOCYTES: 8 %
Monocytes Absolute: 0.5 10*3/uL (ref 0.1–0.9)
NEUTROS ABS: 3.4 10*3/uL (ref 1.4–7.0)
Neutrophils: 61 %
PLATELETS: 159 10*3/uL (ref 150–379)
RBC: 3.07 x10E6/uL — ABNORMAL LOW (ref 3.77–5.28)
RDW: 13 % (ref 12.3–15.4)
WBC: 5.6 10*3/uL (ref 3.4–10.8)

## 2017-09-14 LAB — VITAMIN D 25 HYDROXY (VIT D DEFICIENCY, FRACTURES): VIT D 25 HYDROXY: 17 ng/mL — AB (ref 30.0–100.0)

## 2017-09-15 ENCOUNTER — Telehealth: Payer: Self-pay

## 2017-09-15 ENCOUNTER — Other Ambulatory Visit: Payer: Self-pay | Admitting: Family Medicine

## 2017-09-15 DIAGNOSIS — N183 Chronic kidney disease, stage 3 (moderate): Principal | ICD-10-CM

## 2017-09-15 DIAGNOSIS — D631 Anemia in chronic kidney disease: Secondary | ICD-10-CM

## 2017-09-15 MED ORDER — ERGOCALCIFEROL 1.25 MG (50000 UT) PO CAPS: 50000.0000 [IU] | ORAL_CAPSULE | ORAL | 1 refills | Status: DC

## 2017-09-15 MED FILL — VIT D2 1.25 MG (50,000 UNIT: 1.25 MG | 28 days supply | Qty: 4 | Fill #0

## 2017-09-15 NOTE — Telephone Encounter (Signed)
Pt was called and informed of all lab results. Pt has follow up lab on 10/03.

## 2017-09-20 ENCOUNTER — Ambulatory Visit: Payer: Self-pay | Attending: Internal Medicine

## 2017-09-20 DIAGNOSIS — N183 Chronic kidney disease, stage 3 (moderate): Secondary | ICD-10-CM | POA: Insufficient documentation

## 2017-09-20 DIAGNOSIS — D631 Anemia in chronic kidney disease: Secondary | ICD-10-CM | POA: Insufficient documentation

## 2017-09-20 MED FILL — DILTIAZEM 24HR ER 120 MG CA: 120 | 30 days supply | Qty: 30 | Fill #1

## 2017-09-20 NOTE — Progress Notes (Signed)
Patient here for lab visit  

## 2017-09-21 ENCOUNTER — Emergency Department (HOSPITAL_COMMUNITY): Payer: Self-pay

## 2017-09-21 ENCOUNTER — Encounter (HOSPITAL_COMMUNITY): Payer: Self-pay

## 2017-09-21 ENCOUNTER — Emergency Department (HOSPITAL_COMMUNITY)
Admission: EM | Admit: 2017-09-21 | Discharge: 2017-09-21 | Disposition: A | Discharge status: Home / Self Care | Payer: Self-pay | Attending: Emergency Medicine | Admitting: Emergency Medicine

## 2017-09-21 ENCOUNTER — Ambulatory Visit: Payer: Self-pay | Attending: Internal Medicine | Admitting: Internal Medicine

## 2017-09-21 ENCOUNTER — Encounter: Payer: Self-pay | Admitting: Internal Medicine

## 2017-09-21 ENCOUNTER — Telehealth: Payer: Self-pay

## 2017-09-21 VITALS — BP 123/82 | HR 93 | Temp 98.8°F | Resp 16 | Wt 139.0 lb

## 2017-09-21 DIAGNOSIS — E1029 Type 1 diabetes mellitus with other diabetic kidney complication: Secondary | ICD-10-CM

## 2017-09-21 DIAGNOSIS — E1043 Type 1 diabetes mellitus with diabetic autonomic (poly)neuropathy: Secondary | ICD-10-CM | POA: Insufficient documentation

## 2017-09-21 DIAGNOSIS — R0600 Dyspnea, unspecified: Secondary | ICD-10-CM | POA: Insufficient documentation

## 2017-09-21 DIAGNOSIS — E11649 Type 2 diabetes mellitus with hypoglycemia without coma: Secondary | ICD-10-CM | POA: Insufficient documentation

## 2017-09-21 DIAGNOSIS — K219 Gastro-esophageal reflux disease without esophagitis: Secondary | ICD-10-CM | POA: Insufficient documentation

## 2017-09-21 DIAGNOSIS — R600 Localized edema: Secondary | ICD-10-CM

## 2017-09-21 DIAGNOSIS — E039 Hypothyroidism, unspecified: Secondary | ICD-10-CM | POA: Insufficient documentation

## 2017-09-21 DIAGNOSIS — N183 Chronic kidney disease, stage 3 (moderate): Secondary | ICD-10-CM | POA: Insufficient documentation

## 2017-09-21 DIAGNOSIS — F419 Anxiety disorder, unspecified: Secondary | ICD-10-CM | POA: Insufficient documentation

## 2017-09-21 DIAGNOSIS — Z794 Long term (current) use of insulin: Secondary | ICD-10-CM | POA: Insufficient documentation

## 2017-09-21 DIAGNOSIS — Z87891 Personal history of nicotine dependence: Secondary | ICD-10-CM | POA: Insufficient documentation

## 2017-09-21 DIAGNOSIS — D631 Anemia in chronic kidney disease: Secondary | ICD-10-CM | POA: Insufficient documentation

## 2017-09-21 DIAGNOSIS — Z87440 Personal history of urinary (tract) infections: Secondary | ICD-10-CM | POA: Insufficient documentation

## 2017-09-21 DIAGNOSIS — Z79899 Other long term (current) drug therapy: Secondary | ICD-10-CM | POA: Insufficient documentation

## 2017-09-21 DIAGNOSIS — K3184 Gastroparesis: Secondary | ICD-10-CM | POA: Insufficient documentation

## 2017-09-21 DIAGNOSIS — M7989 Other specified soft tissue disorders: Secondary | ICD-10-CM | POA: Insufficient documentation

## 2017-09-21 DIAGNOSIS — E1022 Type 1 diabetes mellitus with diabetic chronic kidney disease: Secondary | ICD-10-CM | POA: Insufficient documentation

## 2017-09-21 DIAGNOSIS — F329 Major depressive disorder, single episode, unspecified: Secondary | ICD-10-CM | POA: Insufficient documentation

## 2017-09-21 DIAGNOSIS — E162 Hypoglycemia, unspecified: Secondary | ICD-10-CM

## 2017-09-21 DIAGNOSIS — E1042 Type 1 diabetes mellitus with diabetic polyneuropathy: Secondary | ICD-10-CM | POA: Insufficient documentation

## 2017-09-21 DIAGNOSIS — I129 Hypertensive chronic kidney disease with stage 1 through stage 4 chronic kidney disease, or unspecified chronic kidney disease: Secondary | ICD-10-CM | POA: Insufficient documentation

## 2017-09-21 DIAGNOSIS — J45909 Unspecified asthma, uncomplicated: Secondary | ICD-10-CM | POA: Insufficient documentation

## 2017-09-21 DIAGNOSIS — E1021 Type 1 diabetes mellitus with diabetic nephropathy: Secondary | ICD-10-CM | POA: Insufficient documentation

## 2017-09-21 DIAGNOSIS — N179 Acute kidney failure, unspecified: Secondary | ICD-10-CM | POA: Insufficient documentation

## 2017-09-21 DIAGNOSIS — E785 Hyperlipidemia, unspecified: Secondary | ICD-10-CM | POA: Insufficient documentation

## 2017-09-21 DIAGNOSIS — D638 Anemia in other chronic diseases classified elsewhere: Secondary | ICD-10-CM

## 2017-09-21 LAB — CBC WITH DIFFERENTIAL/PLATELET
BASOS ABS: 0 10*3/uL (ref 0.0–0.2)
Basophils Absolute: 0 10*3/uL (ref 0.0–0.1)
Basophils Relative: 0 %
Basos: 0 %
EOS (ABSOLUTE): 0.1 10*3/uL (ref 0.0–0.4)
EOS ABS: 0.1 10*3/uL (ref 0.0–0.7)
Eos: 2 %
Eosinophils Relative: 2 %
HEMATOCRIT: 29.6 % — AB (ref 36.0–46.0)
HEMOGLOBIN: 7.9 g/dL — AB (ref 11.1–15.9)
HEMOGLOBIN: 9.4 g/dL — AB (ref 12.0–15.0)
Hematocrit: 24.2 % — ABNORMAL LOW (ref 34.0–46.6)
Immature Grans (Abs): 0 10*3/uL (ref 0.0–0.1)
Immature Granulocytes: 0 %
LYMPHS ABS: 1.6 10*3/uL (ref 0.7–3.1)
LYMPHS ABS: 2.2 10*3/uL (ref 0.7–4.0)
Lymphocytes Relative: 35 %
Lymphs: 28 %
MCH: 27.5 pg (ref 26.6–33.0)
MCH: 27.5 pg (ref 26.0–34.0)
MCHC: 32.6 g/dL (ref 31.5–35.7)
MCHC: 31.8 g/dL (ref 30.0–36.0)
MCV: 84 fL (ref 79–97)
MCV: 86.5 fL (ref 78.0–100.0)
MONOCYTES: 11 %
MONOS PCT: 10 %
Monocytes Absolute: 0.6 10*3/uL (ref 0.1–0.9)
Monocytes Absolute: 0.6 10*3/uL (ref 0.1–1.0)
Neutro Abs: 3.3 10*3/uL (ref 1.7–7.7)
Neutrophils Absolute: 3.4 10*3/uL (ref 1.4–7.0)
Neutrophils Relative %: 53 %
Neutrophils: 59 %
PLATELETS: 183 10*3/uL (ref 150–379)
Platelets: 178 10*3/uL (ref 150–400)
RBC: 2.87 x10E6/uL — AB (ref 3.77–5.28)
RBC: 3.42 MIL/uL — ABNORMAL LOW (ref 3.87–5.11)
RDW: 13 % (ref 12.3–15.4)
RDW: 12.3 % (ref 11.5–15.5)
WBC: 5.7 10*3/uL (ref 3.4–10.8)
WBC: 6.2 10*3/uL (ref 4.0–10.5)

## 2017-09-21 LAB — CBG MONITORING, ED
GLUCOSE-CAPILLARY: 40 mg/dL — AB (ref 65–99)
Glucose-Capillary: 63 mg/dL — ABNORMAL LOW (ref 65–99)
Glucose-Capillary: 91 mg/dL (ref 65–99)
Glucose-Capillary: 103 mg/dL — ABNORMAL HIGH (ref 65–99)
Glucose-Capillary: 133 mg/dL — ABNORMAL HIGH (ref 65–99)

## 2017-09-21 LAB — POCT URINALYSIS DIPSTICK
BILIRUBIN UA: NEGATIVE
Glucose, UA: >=1000
KETONES UA: NEGATIVE
Leukocytes, UA: NEGATIVE
Nitrite, UA: NEGATIVE
Protein, UA: 100
SPEC GRAV UA: 1.015 (ref 1.010–1.025)
Urobilinogen, UA: 0.2 E.U./dL
pH, UA: 6 (ref 5.0–8.0)

## 2017-09-21 LAB — I-STAT TROPONIN, ED: TROPONIN I, POC: 0 ng/mL (ref 0.00–0.08)

## 2017-09-21 LAB — COMPREHENSIVE METABOLIC PANEL
ALBUMIN: 4.4 g/dL (ref 3.5–5.0)
ALK PHOS: 117 U/L (ref 38–126)
ALT: 50 U/L (ref 14–54)
ANION GAP: 10 (ref 5–15)
AST: 36 U/L (ref 15–41)
BUN: 30 mg/dL — ABNORMAL HIGH (ref 6–20)
CALCIUM: 9.8 mg/dL (ref 8.9–10.3)
CO2: 25 mmol/L (ref 22–32)
Chloride: 102 mmol/L (ref 101–111)
Creatinine, Ser: 2.53 mg/dL — ABNORMAL HIGH (ref 0.44–1.00)
GFR calc Af Amer: 28 mL/min — ABNORMAL LOW (ref >60)
GFR calc non Af Amer: 24 mL/min — ABNORMAL LOW (ref >60)
GLUCOSE: 54 mg/dL — AB (ref 65–99)
Potassium: 4.1 mmol/L (ref 3.5–5.1)
SODIUM: 137 mmol/L (ref 135–145)
Total Bilirubin: 0.6 mg/dL (ref 0.3–1.2)
Total Protein: 7.7 g/dL (ref 6.5–8.1)

## 2017-09-21 LAB — GLUCOSE, POCT (MANUAL RESULT ENTRY)
POC GLUCOSE: 445 mg/dL — AB (ref 70–99)
POC GLUCOSE: 55 mg/dL — AB (ref 70–99)
POC Glucose: 48 mg/dl — AB (ref 70–99)
POC Glucose: 56 mg/dl — AB (ref 70–99)
POC Glucose: 65 mg/dl — AB (ref 70–99)

## 2017-09-21 LAB — URINALYSIS, ROUTINE W REFLEX MICROSCOPIC
BILIRUBIN URINE: NEGATIVE
GLUCOSE, UA: NEGATIVE mg/dL
HGB URINE DIPSTICK: NEGATIVE
Ketones, ur: NEGATIVE mg/dL
Leukocytes, UA: NEGATIVE
NITRITE: NEGATIVE
PH: 7 (ref 5.0–8.0)
Protein, ur: 30 mg/dL — AB
SPECIFIC GRAVITY, URINE: 1.003 — AB (ref 1.005–1.030)

## 2017-09-21 LAB — POCT GLYCOSYLATED HEMOGLOBIN (HGB A1C): HEMOGLOBIN A1C: 9.5

## 2017-09-21 LAB — PROTIME-INR
INR: 0.96
PROTHROMBIN TIME: 12.6 s (ref 11.4–15.2)

## 2017-09-21 LAB — POCT CBG (FASTING - GLUCOSE)-MANUAL ENTRY: GLUCOSE FASTING, POC: 45 mg/dL — AB (ref 70–99)

## 2017-09-21 LAB — BRAIN NATRIURETIC PEPTIDE: B Natriuretic Peptide: 114.1 pg/mL — ABNORMAL HIGH (ref 0.0–100.0)

## 2017-09-21 LAB — TYPE AND SCREEN
ABO/RH(D): O POS
Antibody Screen: NEGATIVE

## 2017-09-21 MED ORDER — INSTA-GLUCOSE 77.4 % PO GEL: 2.0000 | Freq: Once | ORAL | Status: DC

## 2017-09-21 MED ORDER — FUROSEMIDE 20 MG PO TABS: 20.0000 mg | ORAL_TABLET | Freq: Every day | ORAL | 3 refills | Status: DC | PRN

## 2017-09-21 MED ORDER — INSULIN ASPART 100 UNIT/ML ~~LOC~~ SOLN
20.0000 [IU] | Freq: Once | SUBCUTANEOUS | Status: AC
  Administered 2017-09-21: 20 [IU] via SUBCUTANEOUS

## 2017-09-21 MED FILL — ?FUROSEMIDE 20 MG TABLET: 20 | 30 days supply | Qty: 30 | Fill #0

## 2017-09-21 NOTE — ED Triage Notes (Signed)
Pt was seen today at [facility] with CBG of 460. Pt was given 20 units of Novolog and her CBG went down to 54. [facility] gave 2 insta glucose and 2 coke. Pt CBG came up to 67. Pt a&oX4.

## 2017-09-21 NOTE — Telephone Encounter (Signed)
Pt was called and informed of lab results. 

## 2017-09-21 NOTE — Patient Instructions (Addendum)
Please apply for [facility] discount as soon as possible.  Take Furosemide 20 mg daily as needed for swelling in legs.  Try to limit salt.  Decrease Toujeo to 40 units.  If you still wake up with low blood sugars, cut back to 35 units.   Try to get in with your nephrologist and ask about Procrit or Aranesp shot to help keep your blood count up.  If you have increase dizziness or fatigue, be seen in the ER for blood transfusion.

## 2017-09-21 NOTE — ED Notes (Signed)
ED Provider at bedside. 

## 2017-09-21 NOTE — ED Notes (Addendum)
Pt's family member at nurse's station, saying that the pt feels like her sugar is dropping again. Roselyn Reef, RN rechecking CBG now.   CBG 40. Johnney Killian, MD notified. Pt given ginger ale, peanut butter, and crackers.
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
## 2017-09-21 NOTE — ED Notes (Signed)
Pt verbalized understanding discharge instructions and denies any further needs or questions at this time. VS stable, ambulatory and steady gait.   

## 2017-09-21 NOTE — ED Notes (Signed)
Patient transported to X-ray 
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
## 2017-09-22 ENCOUNTER — Telehealth: Payer: Self-pay | Admitting: Internal Medicine

## 2017-09-22 LAB — CBG MONITORING, ED: Glucose-Capillary: 52 mg/dL — ABNORMAL LOW (ref 65–99)

## 2017-09-22 NOTE — Telephone Encounter (Signed)
Call placed to patient [phone] to check on her status. Patient informed me that she is feeling a lot better and is taking her medication as prescribed. Patient also informed me that she recently applied for disability. She is now waiting on a response.

## 2017-09-25 ENCOUNTER — Telehealth: Payer: Self-pay

## 2017-09-25 NOTE — Telephone Encounter (Signed)
Contacted pt to check on her status since being sent to the ED Thursday. Pt stated she is doing better and she is taking all her medication. Pt states her blood sugars are running from 230-323 with 323 being the highest. Pt states the day she was sent to the ED they got her sugar up to 190 something. Pt states she doesn't have any insurance. Pt states she appreciates the call and will see [place] tomorrow

## 2017-09-26 ENCOUNTER — Ambulatory Visit: Payer: Self-pay | Admitting: Internal Medicine

## 2017-09-26 ENCOUNTER — Ambulatory Visit: Payer: Self-pay | Attending: Internal Medicine | Admitting: Internal Medicine

## 2017-09-26 ENCOUNTER — Encounter: Payer: Self-pay | Admitting: Internal Medicine

## 2017-09-26 VITALS — BP 121/81 | HR 111 | Temp 98.9°F | Resp 16 | Wt 135.2 lb

## 2017-09-26 DIAGNOSIS — E1065 Type 1 diabetes mellitus with hyperglycemia: Secondary | ICD-10-CM

## 2017-09-26 DIAGNOSIS — Z823 Family history of stroke: Secondary | ICD-10-CM | POA: Insufficient documentation

## 2017-09-26 DIAGNOSIS — Z794 Long term (current) use of insulin: Secondary | ICD-10-CM | POA: Insufficient documentation

## 2017-09-26 DIAGNOSIS — I129 Hypertensive chronic kidney disease with stage 1 through stage 4 chronic kidney disease, or unspecified chronic kidney disease: Secondary | ICD-10-CM | POA: Insufficient documentation

## 2017-09-26 DIAGNOSIS — E1029 Type 1 diabetes mellitus with other diabetic kidney complication: Secondary | ICD-10-CM

## 2017-09-26 DIAGNOSIS — Z79899 Other long term (current) drug therapy: Secondary | ICD-10-CM | POA: Insufficient documentation

## 2017-09-26 DIAGNOSIS — Z8249 Family history of ischemic heart disease and other diseases of the circulatory system: Secondary | ICD-10-CM | POA: Insufficient documentation

## 2017-09-26 DIAGNOSIS — Z833 Family history of diabetes mellitus: Secondary | ICD-10-CM | POA: Insufficient documentation

## 2017-09-26 DIAGNOSIS — E1022 Type 1 diabetes mellitus with diabetic chronic kidney disease: Secondary | ICD-10-CM | POA: Insufficient documentation

## 2017-09-26 DIAGNOSIS — Z87891 Personal history of nicotine dependence: Secondary | ICD-10-CM | POA: Insufficient documentation

## 2017-09-26 DIAGNOSIS — N183 Chronic kidney disease, stage 3 (moderate): Secondary | ICD-10-CM | POA: Insufficient documentation

## 2017-09-26 DIAGNOSIS — R600 Localized edema: Secondary | ICD-10-CM | POA: Insufficient documentation

## 2017-09-26 DIAGNOSIS — IMO0002 Reserved for concepts with insufficient information to code with codable children: Secondary | ICD-10-CM

## 2017-09-26 DIAGNOSIS — E039 Hypothyroidism, unspecified: Secondary | ICD-10-CM | POA: Insufficient documentation

## 2017-09-26 DIAGNOSIS — D638 Anemia in other chronic diseases classified elsewhere: Secondary | ICD-10-CM

## 2017-09-26 LAB — GLUCOSE, POCT (MANUAL RESULT ENTRY): POC Glucose: 120 mg/dl — AB (ref 70–99)

## 2017-09-26 NOTE — Patient Instructions (Signed)
Try to avoid skipping meals.  Please keep a log of your blood sugar readings and bring with you on your next visit.   Always keep a pack of glucose tablets with you.
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
## 2017-10-16 ENCOUNTER — Ambulatory Visit: Payer: Self-pay | Admitting: Internal Medicine

## 2017-10-20 MED FILL — VIT D2 1.25 MG (50,000 UNIT: 1.25 MG | 28 days supply | Qty: 4 | Fill #1

## 2017-10-20 MED FILL — AMLODIPINE BESYLATE 10 MG T: 10 | 30 days supply | Qty: 30 | Fill #1

## 2017-10-20 MED FILL — ?LEVOTHYROXINE 200 MCG TAB: 200 MCG | 30 days supply | Qty: 30 | Fill #2

## 2017-10-27 ENCOUNTER — Ambulatory Visit: Payer: Self-pay | Attending: Internal Medicine | Admitting: Internal Medicine

## 2017-10-27 ENCOUNTER — Encounter: Payer: Self-pay | Admitting: Internal Medicine

## 2017-10-27 VITALS — BP 138/88 | HR 98 | Temp 97.6°F | Resp 18 | Ht 65.0 in | Wt 141.8 lb

## 2017-10-27 DIAGNOSIS — E785 Hyperlipidemia, unspecified: Secondary | ICD-10-CM | POA: Insufficient documentation

## 2017-10-27 DIAGNOSIS — K3184 Gastroparesis: Secondary | ICD-10-CM | POA: Insufficient documentation

## 2017-10-27 DIAGNOSIS — Z79899 Other long term (current) drug therapy: Secondary | ICD-10-CM | POA: Insufficient documentation

## 2017-10-27 DIAGNOSIS — E039 Hypothyroidism, unspecified: Secondary | ICD-10-CM | POA: Insufficient documentation

## 2017-10-27 DIAGNOSIS — F419 Anxiety disorder, unspecified: Secondary | ICD-10-CM | POA: Insufficient documentation

## 2017-10-27 DIAGNOSIS — Z794 Long term (current) use of insulin: Secondary | ICD-10-CM | POA: Insufficient documentation

## 2017-10-27 DIAGNOSIS — K219 Gastro-esophageal reflux disease without esophagitis: Secondary | ICD-10-CM | POA: Insufficient documentation

## 2017-10-27 DIAGNOSIS — N184 Chronic kidney disease, stage 4 (severe): Secondary | ICD-10-CM | POA: Insufficient documentation

## 2017-10-27 DIAGNOSIS — I129 Hypertensive chronic kidney disease with stage 1 through stage 4 chronic kidney disease, or unspecified chronic kidney disease: Secondary | ICD-10-CM | POA: Insufficient documentation

## 2017-10-27 DIAGNOSIS — F329 Major depressive disorder, single episode, unspecified: Secondary | ICD-10-CM | POA: Insufficient documentation

## 2017-10-27 DIAGNOSIS — E108 Type 1 diabetes mellitus with unspecified complications: Secondary | ICD-10-CM

## 2017-10-27 DIAGNOSIS — E1029 Type 1 diabetes mellitus with other diabetic kidney complication: Secondary | ICD-10-CM

## 2017-10-27 DIAGNOSIS — E1042 Type 1 diabetes mellitus with diabetic polyneuropathy: Secondary | ICD-10-CM | POA: Insufficient documentation

## 2017-10-27 DIAGNOSIS — E1065 Type 1 diabetes mellitus with hyperglycemia: Secondary | ICD-10-CM | POA: Insufficient documentation

## 2017-10-27 DIAGNOSIS — IMO0002 Reserved for concepts with insufficient information to code with codable children: Secondary | ICD-10-CM

## 2017-10-27 DIAGNOSIS — R5383 Other fatigue: Secondary | ICD-10-CM | POA: Insufficient documentation

## 2017-10-27 DIAGNOSIS — Z87891 Personal history of nicotine dependence: Secondary | ICD-10-CM | POA: Insufficient documentation

## 2017-10-27 DIAGNOSIS — E1022 Type 1 diabetes mellitus with diabetic chronic kidney disease: Secondary | ICD-10-CM | POA: Insufficient documentation

## 2017-10-27 DIAGNOSIS — E1043 Type 1 diabetes mellitus with diabetic autonomic (poly)neuropathy: Secondary | ICD-10-CM

## 2017-10-27 DIAGNOSIS — D631 Anemia in chronic kidney disease: Secondary | ICD-10-CM

## 2017-10-27 DIAGNOSIS — E1021 Type 1 diabetes mellitus with diabetic nephropathy: Secondary | ICD-10-CM | POA: Insufficient documentation

## 2017-10-27 DIAGNOSIS — E1040 Type 1 diabetes mellitus with diabetic neuropathy, unspecified: Secondary | ICD-10-CM | POA: Insufficient documentation

## 2017-10-27 LAB — GLUCOSE, POCT (MANUAL RESULT ENTRY): POC Glucose: HIGH mg/dl (ref 70–99)

## 2017-10-27 NOTE — Patient Instructions (Signed)
Your blood sugar is reading high at the office visit today.  You have declined also giving you NovoLog insulin.  You should be seen in the emergency room.  Please work on giving yourself NovoLog consistently with meals.  Stop drinking orange juice. Add some Glucerna shakes.  You can drink 1 to supplement your meals. You can take Reglan up to twice a day with meals as needed.  Please complete the form for the [facility] discount so that we can refer you to an endocrinologist.

## 2017-10-29 MED ORDER — METOCLOPRAMIDE HCL 5 MG PO TABS: ORAL_TABLET | ORAL | 5 refills | Status: DC

## 2017-10-29 NOTE — Progress Notes (Signed)
Patient ID: Angela [name], female    DOB: 04/06/1986  MRN: [id number]  CC: No chief complaint on file.   Subjective: Angela [name] is a 31 y.o. female who presents for 1 mth f/u Her concerns today include:  Pt with hx of DM type 1 with neuropathy, gastroparesis and nephropathy, HTN, CKD stage 3-4, ACD/IDA, hypothyroid, HL  1. DM: BS today reading HIGH here in the office. Drank 4 cups of of OJ today before coming in.  BS range 80-150s in mornings, and 200-300s before dinner. This a.m was 153 - takes Novolog 0-15 with meals, Toujeo 45 units once a day Eating habits: not good. Drinks OJ because she does not have appetite. Also has a lot of nausea.  Takes Reglan once a day -plan is to refer to endocrine once she is approved for [facility] card/[facility] discount. She has not completed paper work as yet -has disability hearing coming up the end of this mth  2. HTN/CKD 4: reports compliance with meds. Has appt with neph at [facility] 11/28/2017  3. Fatigue.  Last menses last mth lasted 7 days. No dizziness -forgot to return to lab for repeat CBC since last visit  Patient Active Problem List   Diagnosis Date Noted  . Anemia requiring transfusions 08/24/2017  . Dyspnea 08/24/2017  . Anxiety and depression 06/23/2017  . Insomnia 01/12/2016  . Non compliance with medical treatment 01/05/2016  . Hyperlipidemia 01/05/2016  . Flank pain   . Chronic kidney disease (CKD) stage G3a/A3, moderately decreased glomerular filtration rate (GFR) between 45-59 mL/min/1.73 square meter and albuminuria creatinine ratio greater than 300 mg/g (HCC) 07/28/2015  . AKI (acute kidney injury) ([place]) 07/20/2015  . Recurrent UTI 02/22/2015  . Neurogenic bladder 02/20/2015  . Diabetic peripheral neuropathy associated with type 1 diabetes mellitus ([place]) 02/20/2015  . Atypical squamous cells of undetermined significance (ASCUS) on Papanicolaou smear of cervix 08/11/2014  . Diabetic gastroparesis associated with type 1 diabetes  mellitus ([place]) 08/05/2014  . Anemia in chronic renal disease 08/05/2014  . HTN (hypertension) 07/11/2012  . GERD (gastroesophageal reflux disease) 09/26/2011  . Hypothyroidism 09/14/2006  . 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, with long-term current use of insulin ([place]) 01/15/2000     Current Outpatient Medications on File Prior to Visit  Medication Sig Dispense Refill  . amLODipine (NORVASC) 10 MG tablet Take 1 tablet (10 mg total) by mouth daily. 90 tablet 3  . diltiazem (DILACOR XR) 120 MG 24 hr capsule Take 120 mg by mouth daily.    . ergocalciferol (DRISDOL) 50000 units capsule Take 1 capsule (50,000 Units total) by mouth once a week. 9 capsule 1  . escitalopram (LEXAPRO) 10 MG tablet Take 1 tablet (10 mg total) by mouth daily. 30 tablet 2  . furosemide (LASIX) 20 MG tablet Take 1 tablet (20 mg total) by mouth daily as needed for edema. 30 tablet 3  . gabapentin (NEURONTIN) 300 MG capsule Take 1 capsule (300 mg total) by mouth 2 (two) times daily. (Patient taking differently: Take 600 mg by mouth daily. ) 60 capsule 3  . hydrOXYzine (ATARAX/VISTARIL) 25 MG tablet Take 1 tablet (25 mg total) by mouth at bedtime as needed. (Patient taking differently: Take 25 mg by mouth at bedtime as needed for anxiety. ) 30 tablet 2  . insulin aspart (NOVOLOG) 100 UNIT/ML injection Inject 0-12 Units into the skin 3 (three) times daily with meals. As per sliding scale 10 mL 3  . Insulin Glargine (TOUJEO SOLOSTAR) 300 UNIT/ML SOPN  Inject 50 Units into the skin at bedtime. 6 mL 3  . ketoconazole (NIZORAL) 2 % shampoo Apply 1 application topically 2 (two) times a week. 120 mL 3  . levothyroxine (SYNTHROID, LEVOTHROID) 200 MCG tablet Take 1 tablet (200 mcg total) by mouth daily before breakfast. 30 tablet 5  . metoCLOPramide (REGLAN) 5 MG tablet 1 tab Q a.m with breakfast. (Patient taking differently: Take 5 mg by mouth daily with breakfast. ) 30 tablet 1  . pravastatin (PRAVACHOL) 20 MG tablet  Take 20 mg by mouth daily.    . sodium bicarbonate 650 MG tablet Take 1 tablet (650 mg total) by mouth 2 (two) times daily. 60 tablet 0   No current facility-administered medications on file prior to visit.     Allergies  Allergen Reactions  . Ferumoxytol Itching  . Lisinopril Other (See Comments)    hyperkalemia  . Sulfamethoxazole Hives and Itching  . Trimethoprim Hives    Social History   Socioeconomic History  . Marital status: Divorced    Spouse name: Not on file  . Number of children: 0  . Years of education: 10th grade  . Highest education level: Not on file  Social Needs  . Financial resource strain: Not on file  . Food insecurity - worry: Not on file  . Food insecurity - inability: Not on file  . Transportation needs - medical: Not on file  . Transportation needs - non-medical: Not on file  Occupational History  . Occupation: unemployed    Comment: worked at a group  Tobacco Use  . Smoking status: Former Smoker    Packs/day: 0.25    Years: 2.00    Pack years: 0.50    Types: Cigarettes    Last attempt to quit: 03/04/2013    Years since quitting: 4.6  . Smokeless tobacco: Never Used  Substance and Sexual Activity  . Alcohol use: No    Alcohol/week: 0.0 oz  . Drug use: Yes    Types: Marijuana    Comment: last use 02/03/2016  . Sexual activity: No    Birth control/protection: None    Comment: women preference   Other Topics Concern  . Not on file  Social History Narrative   Occupation: currently unemployed   Single   Homosexual,     Lives with mom.    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            Family History  Problem Relation Age of  Onset  . Multiple sclerosis Mother   . Hypothyroidism Mother   . Stroke Mother        at age 47 yo  . Hyperlipidemia Maternal Grandmother   . Hypertension Maternal Grandmother   . Heart disease Maternal Grandmother        unknown type  . Diabetes Maternal Grandmother   . Hypertension Maternal Grandfather   . Prostate cancer Maternal Grandfather   . Diabetes type I Maternal Grandfather   . Breast cancer Paternal Grandmother   . Cancer Neg Hx     Past Surgical History:  Procedure Laterality Date  . FOOT FUSION  Right 2006   "put screws in it too" (09/19/2013)    ROS: Review of Systems Neg except as above  PHYSICAL EXAM: BP 138/88 (BP Location: Left Arm, Patient Position: Sitting, Cuff Size: Normal)   Pulse 98   Temp 97.6 F (36.4 C)   Resp 18   Ht 5\' 5"  (1.651 m)   Wt 141 lb 12.8 oz (64.3 kg)   SpO2 97%   BMI 23.60 kg/m   Physical Exam  General appearance - alert, well appearing, and in no distress Mental status - alert, oriented to person, place, and time, normal mood, behavior, speech, dress, motor activity, and thought processes Mouth - mucous membranes moist, pharynx normal without lesions Chest - clear to auscultation, no wheezes, rales or rhonchi, symmetric air entry Heart - normal rate, regular rhythm, normal S1, S2, no murmurs, rubs, clicks or gallops Extremities -no LE edema  Results for orders placed or performed in visit on 10/27/17  POCT glucose (manual entry)  Result Value Ref Range   POC Glucose High 70 - 99 mg/dl     ASSESSMENT AND PLAN: 1. Uncontrolled type 1 diabetes mellitus with complication ([place]) -pt declined POC Novolog injection stating that she will take insulin when she returns home. She also declined going to ER. I told her that once she gets home and takes Novolog, repeat BS in 30 mins. If still reading HIGH, she should be seen in ED. -recommend that she takes at least 5-10 units Novolog consistently with meals -Stop drinking fruit  juices.  Recommend drinking Glucerna shakes when she does not feel like eating full meal -increase Reglan to BID with 2 larges meals of the day -complete application for [facility] discount encase she does not get disability - POCT glucose (manual entry)  2. Anemia in stage 4 chronic kidney disease ([place]) -return to lab for CBC Remember to ask nephrologist about Aranesp   Patient was given the opportunity to ask questions.  Patient verbalized understanding of the plan and was able to repeat key elements of the plan.   Orders Placed This Encounter  Procedures  . POCT glucose (manual entry)     Requested Prescriptions    No prescriptions requested or ordered in this encounter    No Follow-up on file.  Karle Plumber, MD, FACP

## 2017-10-30 ENCOUNTER — Ambulatory Visit: Payer: Self-pay | Attending: Internal Medicine

## 2017-10-30 DIAGNOSIS — D638 Anemia in other chronic diseases classified elsewhere: Secondary | ICD-10-CM | POA: Insufficient documentation

## 2017-10-30 DIAGNOSIS — N184 Chronic kidney disease, stage 4 (severe): Secondary | ICD-10-CM | POA: Insufficient documentation

## 2017-10-30 MED FILL — METOCLOPRAMIDE 5 MG TABLET: 5 | 30 days supply | Qty: 60 | Fill #0

## 2017-10-30 NOTE — Progress Notes (Signed)
Patient here for lab visit  

## 2017-10-31 ENCOUNTER — Telehealth: Payer: Self-pay | Admitting: Internal Medicine

## 2017-10-31 LAB — CBC
Hematocrit: 25.7 % — ABNORMAL LOW (ref 34.0–46.6)
Hemoglobin: 8.1 g/dL — ABNORMAL LOW (ref 11.1–15.9)
MCH: 26.7 pg (ref 26.6–33.0)
MCHC: 31.5 g/dL (ref 31.5–35.7)
MCV: 85 fL (ref 79–97)
PLATELETS: 153 10*3/uL (ref 150–379)
RBC: 3.03 x10E6/uL — AB (ref 3.77–5.28)
RDW: 13.3 % (ref 12.3–15.4)
WBC: 5.3 10*3/uL (ref 3.4–10.8)

## 2017-10-31 NOTE — Telephone Encounter (Signed)
Pt. Returned PCP call. Pt. Was told that her PCP had sent an e-mail via my chart and pt. Stated that she did not receive it. Please f/u

## 2017-10-31 NOTE — Telephone Encounter (Signed)
PC placed to pt this a.m to discuss CBC results.  Her voicemail box was full. I also called her mother's home phone and left a message on voice mail that I was calling and I have sent her a message via [place].

## 2017-11-01 NOTE — Telephone Encounter (Signed)
If pt returns call please go over with with pt

## 2017-11-03 ENCOUNTER — Telehealth: Payer: Self-pay | Admitting: Internal Medicine

## 2017-11-03 NOTE — Telephone Encounter (Signed)
PC placed to pt this evening to discuss last CBC results. Pt had stopped by the office today to try to speak with me. I left message letting her know that I was calling and will try to reach her again this weekend.

## 2017-11-07 NOTE — Telephone Encounter (Signed)
Patient verified DOB Patient was made aware of hemoglobin level being 8.1 from 9.4. Patient advised of PCP prescribing Oral iron if patient was willing due to tablet causing upset stomach. Patient agreed to liquid. Patient complains of her menstrual beginning yesterday and having to change her tampon every time she used the restroom. Patient states today she used 3 pads and 1 tampon from 9:30am to 3:30.  Patient was made aware of needing to report to the ED since she complains of dizziness and fatigue.  Patient states when her sister gets off work this evening she will report to the ED TODAY for a CBC check. Patient advised to schedule a ED FU after she reports to the ED. No further questions at this time.

## 2017-11-08 NOTE — Telephone Encounter (Signed)
PC placed to pt today. She c/o feeling dizzy when she stands up for the past 3 days. She has also had N/V and chest heaviness for past 3 days. Morning BS have been less than 200. Currently on menses which is heavy the first 2 days and normally last 7 days. Pt with chronic anemia related to CKD requiring PRBC transfusions. Was on oral iron in past but states she can not tolerate because it hurts her stomach. Had IV iron in past with itching. There is ? Sulfa allergy. Therefore, I will hold off on trying liquid Ferrous Sulfate. Pt advised to be seen in ER for the dizziness. May be due to dehydration, vs worsening anemia vs DKA.  I call and spoke with pt's nephrologist at [facility], Dr. Ronnald Ramp, about getting pt on Aranesp. We went over her last iron studies done 07/2017, recent H/Hs. She said she will have their [facility] contact pt and get her in for eval for possible iron transfusion vs Aranesp. I also inquired where pt should be on Norvasc and Cardizem and she said yes.  I informed pt of this conversation.

## 2017-11-12 ENCOUNTER — Encounter (HOSPITAL_COMMUNITY): Payer: Self-pay | Admitting: *Deleted

## 2017-11-12 ENCOUNTER — Emergency Department (HOSPITAL_COMMUNITY): Payer: Self-pay

## 2017-11-12 ENCOUNTER — Emergency Department (HOSPITAL_COMMUNITY)
Admission: EM | Admit: 2017-11-12 | Discharge: 2017-11-12 | Disposition: A | Discharge status: Home / Self Care | Payer: Self-pay | Attending: Emergency Medicine | Admitting: Emergency Medicine

## 2017-11-12 DIAGNOSIS — Z7902 Long term (current) use of antithrombotics/antiplatelets: Secondary | ICD-10-CM | POA: Insufficient documentation

## 2017-11-12 DIAGNOSIS — Z87891 Personal history of nicotine dependence: Secondary | ICD-10-CM | POA: Insufficient documentation

## 2017-11-12 DIAGNOSIS — J45909 Unspecified asthma, uncomplicated: Secondary | ICD-10-CM | POA: Insufficient documentation

## 2017-11-12 DIAGNOSIS — I129 Hypertensive chronic kidney disease with stage 1 through stage 4 chronic kidney disease, or unspecified chronic kidney disease: Secondary | ICD-10-CM | POA: Insufficient documentation

## 2017-11-12 DIAGNOSIS — N183 Chronic kidney disease, stage 3 (moderate): Secondary | ICD-10-CM | POA: Insufficient documentation

## 2017-11-12 DIAGNOSIS — Z79899 Other long term (current) drug therapy: Secondary | ICD-10-CM | POA: Insufficient documentation

## 2017-11-12 DIAGNOSIS — E1065 Type 1 diabetes mellitus with hyperglycemia: Secondary | ICD-10-CM | POA: Insufficient documentation

## 2017-11-12 DIAGNOSIS — E039 Hypothyroidism, unspecified: Secondary | ICD-10-CM | POA: Insufficient documentation

## 2017-11-12 DIAGNOSIS — Z794 Long term (current) use of insulin: Secondary | ICD-10-CM | POA: Insufficient documentation

## 2017-11-12 DIAGNOSIS — R112 Nausea with vomiting, unspecified: Secondary | ICD-10-CM | POA: Insufficient documentation

## 2017-11-12 DIAGNOSIS — R197 Diarrhea, unspecified: Secondary | ICD-10-CM | POA: Insufficient documentation

## 2017-11-12 LAB — I-STAT VENOUS BLOOD GAS, ED
Acid-base deficit: 3 mmol/L — ABNORMAL HIGH (ref 0.0–2.0)
BICARBONATE: 23.8 mmol/L (ref 20.0–28.0)
O2 Saturation: 29 %
PCO2 VEN: 47.1 mmHg (ref 44.0–60.0)
PH VEN: 7.311 (ref 7.250–7.430)
PO2 VEN: 21 mmHg — AB (ref 32.0–45.0)
TCO2: 25 mmol/L (ref 22–32)

## 2017-11-12 LAB — URINALYSIS, ROUTINE W REFLEX MICROSCOPIC
Bacteria, UA: NONE SEEN
Bilirubin Urine: NEGATIVE
Ketones, ur: 5 mg/dL — AB
Leukocytes, UA: NEGATIVE
Nitrite: NEGATIVE
PH: 7 (ref 5.0–8.0)
PROTEIN: 30 mg/dL — AB
SPECIFIC GRAVITY, URINE: 1.014 (ref 1.005–1.030)

## 2017-11-12 LAB — CBC
HCT: 26.1 % — ABNORMAL LOW (ref 36.0–46.0)
Hemoglobin: 8.3 g/dL — ABNORMAL LOW (ref 12.0–15.0)
MCH: 27.8 pg (ref 26.0–34.0)
MCHC: 31.8 g/dL (ref 30.0–36.0)
MCV: 87.3 fL (ref 78.0–100.0)
PLATELETS: 158 10*3/uL (ref 150–400)
RBC: 2.99 MIL/uL — AB (ref 3.87–5.11)
RDW: 12.7 % (ref 11.5–15.5)
WBC: 5.8 10*3/uL (ref 4.0–10.5)

## 2017-11-12 LAB — COMPREHENSIVE METABOLIC PANEL
ALT: 27 U/L (ref 14–54)
AST: 25 U/L (ref 15–41)
Albumin: 3.9 g/dL (ref 3.5–5.0)
Alkaline Phosphatase: 116 U/L (ref 38–126)
Anion gap: 12 (ref 5–15)
BILIRUBIN TOTAL: 0.8 mg/dL (ref 0.3–1.2)
BUN: 36 mg/dL — AB (ref 6–20)
CALCIUM: 9.2 mg/dL (ref 8.9–10.3)
CO2: 21 mmol/L — AB (ref 22–32)
CREATININE: 2.8 mg/dL — AB (ref 0.44–1.00)
Chloride: 92 mmol/L — ABNORMAL LOW (ref 101–111)
GFR, EST AFRICAN AMERICAN: 25 mL/min — AB (ref >60)
GFR, EST NON AFRICAN AMERICAN: 21 mL/min — AB (ref >60)
Glucose, Bld: 759 mg/dL (ref 65–99)
Potassium: 5.3 mmol/L — ABNORMAL HIGH (ref 3.5–5.1)
Sodium: 125 mmol/L — ABNORMAL LOW (ref 135–145)
Total Protein: 6.9 g/dL (ref 6.5–8.1)

## 2017-11-12 LAB — CBG MONITORING, ED
GLUCOSE-CAPILLARY: 370 mg/dL — AB (ref 65–99)
GLUCOSE-CAPILLARY: 576 mg/dL — AB (ref 65–99)
GLUCOSE-CAPILLARY: 59 mg/dL — AB (ref 65–99)
GLUCOSE-CAPILLARY: 117 mg/dL — AB (ref 65–99)
Glucose-Capillary: >600 mg/dL (ref 65–99)
Glucose-Capillary: 145 mg/dL — ABNORMAL HIGH (ref 65–99)

## 2017-11-12 LAB — LIPASE, BLOOD: Lipase: 33 U/L (ref 11–51)

## 2017-11-12 LAB — I-STAT BETA HCG BLOOD, ED (MC, WL, AP ONLY): I-stat hCG, quantitative: <5 m[IU]/mL (ref <5)

## 2017-11-12 MED ORDER — PENICILLIN V POTASSIUM 250 MG PO TABS: 500.0000 mg | ORAL_TABLET | Freq: Once | ORAL | Status: DC

## 2017-11-12 MED ORDER — AMOXICILLIN-POT CLAVULANATE 875-125 MG PO TABS: 1.0000 | ORAL_TABLET | Freq: Two times a day (BID) | ORAL | 0 refills | Status: DC

## 2017-11-12 MED ORDER — ACETAMINOPHEN 500 MG PO TABS
1000.0000 mg | ORAL_TABLET | Freq: Once | ORAL | Status: AC
  Administered 2017-11-12: 1000 mg via ORAL
  Filled 2017-11-12: qty 2

## 2017-11-12 MED ORDER — ONDANSETRON HCL 4 MG PO TABS: 4.0000 mg | ORAL_TABLET | Freq: Four times a day (QID) | ORAL | 0 refills | Status: DC

## 2017-11-12 MED ORDER — AMOXICILLIN-POT CLAVULANATE 875-125 MG PO TABS
1.0000 | ORAL_TABLET | Freq: Once | ORAL | Status: AC
  Administered 2017-11-12: 1 via ORAL
  Filled 2017-11-12: qty 1

## 2017-11-12 MED ORDER — SODIUM CHLORIDE 0.9 % IV SOLN
INTRAVENOUS | Status: DC
  Administered 2017-11-12: 5.4 [IU]/h via INTRAVENOUS
  Filled 2017-11-12: qty 1

## 2017-11-12 MED ORDER — DEXTROSE-NACL 5-0.45 % IV SOLN: INTRAVENOUS | Status: DC

## 2017-11-12 MED ORDER — SODIUM CHLORIDE 0.9 % IV BOLUS (SEPSIS)
1000.0000 mL | Freq: Once | INTRAVENOUS | Status: AC
  Administered 2017-11-12: 1000 mL via INTRAVENOUS

## 2017-11-12 MED ORDER — SODIUM CHLORIDE 0.9 % IV SOLN
INTRAVENOUS | Status: DC
  Administered 2017-11-12: 18:00:00 via INTRAVENOUS

## 2017-11-12 MED ORDER — ONDANSETRON HCL 4 MG/2ML IJ SOLN
4.0000 mg | Freq: Once | INTRAMUSCULAR | Status: AC
  Administered 2017-11-12: 4 mg via INTRAVENOUS
  Filled 2017-11-12: qty 2

## 2017-11-12 NOTE — ED Notes (Signed)
CBG 59, per EDP continue to hold dextrose 5% and give pt [place] sandwich and OJ. Pt alert and verbalizes understanding.

## 2017-11-12 NOTE — ED Triage Notes (Signed)
Pt reports fatigue, n/v/d x 4 days.
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
## 2017-11-12 NOTE — ED Notes (Signed)
Graham crackers and water given to pt for PO challenge per EDP.

## 2017-11-12 NOTE — ED Notes (Signed)
Patient called out, states she believes she is vomiting up blood.  This RN assessed, ABC intact.  Left sample for primary RN in room and made her aware.  Patient placed on monitor.

## 2017-11-12 NOTE — ED Notes (Signed)
CBG 145  

## 2017-11-12 NOTE — Discharge Instructions (Signed)
Medications: Zofran, Augmentin  Treatment: Take Zofran every 6 hours as needed for nausea or vomiting.  Take Augmentin twice daily until completed for your tooth.  You can take Tylenol 1000 mg every 6 hours.  Do not take more than 4000 mg in a 24-hour period.  Patient to drink plenty of fluids.  Follow-up: Please see your doctor in the next 2 days for recheck and follow-up of today's visit.  Please see a dentist as soon as possible for further evaluation and treatment of your dental pain and most likely infection.  Please return to the emergency department immediately if you develop any new or worse symptoms.  In addition to the resources below, you can also contact: [facility]  [address]  [place], [place] [address]  Phone [phone]

## 2017-11-12 NOTE — ED Notes (Signed)
Patient transported to X-ray 

## 2017-11-14 ENCOUNTER — Telehealth: Payer: Self-pay | Admitting: Internal Medicine

## 2017-11-14 MED FILL — ?ONDANSETRON HCL 4 MG TABLE: 4 | 3 days supply | Qty: 12 | Fill #0

## 2017-11-14 MED FILL — $TOUJEO SOLOSTAR 300 UNIT/M: 300 | 30 days supply | Qty: 4 | Fill #0

## 2017-11-14 MED FILL — AMOX-CLAV 875-125 MG TABLET: 875-125 | 7 days supply | Qty: 14 | Fill #0

## 2017-11-14 NOTE — Telephone Encounter (Signed)
Pt came to the office since she went to the ED 11/13/17 try to schedule and ppt with her pcp unable since she already book, she want the PCP to call her back since she still with the same symptoms and not getting better, please follow up

## 2017-11-16 ENCOUNTER — Ambulatory Visit (INDEPENDENT_AMBULATORY_CARE_PROVIDER_SITE_OTHER): Payer: Self-pay | Admitting: Licensed Clinical Social Worker

## 2017-11-16 ENCOUNTER — Encounter: Payer: Self-pay | Admitting: Internal Medicine

## 2017-11-16 ENCOUNTER — Ambulatory Visit: Payer: Self-pay | Attending: Internal Medicine | Admitting: Internal Medicine

## 2017-11-16 VITALS — BP 138/75 | HR 88 | Temp 97.9°F | Resp 18 | Ht 65.0 in | Wt 147.0 lb

## 2017-11-16 DIAGNOSIS — Z9189 Other specified personal risk factors, not elsewhere classified: Secondary | ICD-10-CM

## 2017-11-16 DIAGNOSIS — E1043 Type 1 diabetes mellitus with diabetic autonomic (poly)neuropathy: Secondary | ICD-10-CM

## 2017-11-16 DIAGNOSIS — Z79899 Other long term (current) drug therapy: Secondary | ICD-10-CM | POA: Insufficient documentation

## 2017-11-16 DIAGNOSIS — E1022 Type 1 diabetes mellitus with diabetic chronic kidney disease: Secondary | ICD-10-CM | POA: Insufficient documentation

## 2017-11-16 DIAGNOSIS — K219 Gastro-esophageal reflux disease without esophagitis: Secondary | ICD-10-CM | POA: Insufficient documentation

## 2017-11-16 DIAGNOSIS — E1042 Type 1 diabetes mellitus with diabetic polyneuropathy: Secondary | ICD-10-CM | POA: Insufficient documentation

## 2017-11-16 DIAGNOSIS — D638 Anemia in other chronic diseases classified elsewhere: Secondary | ICD-10-CM

## 2017-11-16 DIAGNOSIS — N183 Chronic kidney disease, stage 3 (moderate): Secondary | ICD-10-CM | POA: Insufficient documentation

## 2017-11-16 DIAGNOSIS — D631 Anemia in chronic kidney disease: Secondary | ICD-10-CM | POA: Insufficient documentation

## 2017-11-16 DIAGNOSIS — E039 Hypothyroidism, unspecified: Secondary | ICD-10-CM | POA: Insufficient documentation

## 2017-11-16 DIAGNOSIS — E109 Type 1 diabetes mellitus without complications: Secondary | ICD-10-CM

## 2017-11-16 DIAGNOSIS — Z79890 Hormone replacement therapy (postmenopausal): Secondary | ICD-10-CM | POA: Insufficient documentation

## 2017-11-16 DIAGNOSIS — F329 Major depressive disorder, single episode, unspecified: Secondary | ICD-10-CM | POA: Insufficient documentation

## 2017-11-16 DIAGNOSIS — I129 Hypertensive chronic kidney disease with stage 1 through stage 4 chronic kidney disease, or unspecified chronic kidney disease: Secondary | ICD-10-CM | POA: Insufficient documentation

## 2017-11-16 DIAGNOSIS — E785 Hyperlipidemia, unspecified: Secondary | ICD-10-CM | POA: Insufficient documentation

## 2017-11-16 DIAGNOSIS — Z794 Long term (current) use of insulin: Secondary | ICD-10-CM | POA: Insufficient documentation

## 2017-11-16 DIAGNOSIS — Z87891 Personal history of nicotine dependence: Secondary | ICD-10-CM | POA: Insufficient documentation

## 2017-11-16 DIAGNOSIS — F419 Anxiety disorder, unspecified: Secondary | ICD-10-CM | POA: Insufficient documentation

## 2017-11-16 DIAGNOSIS — K3184 Gastroparesis: Secondary | ICD-10-CM | POA: Insufficient documentation

## 2017-11-16 DIAGNOSIS — E1029 Type 1 diabetes mellitus with other diabetic kidney complication: Secondary | ICD-10-CM

## 2017-11-16 LAB — GLUCOSE, POCT (MANUAL RESULT ENTRY)
POC Glucose: 66 mg/dl — AB (ref 70–99)
POC Glucose: 61 mg/dl — AB (ref 70–99)

## 2017-11-16 MED ORDER — METOCLOPRAMIDE HCL 5 MG PO TABS: 5.0000 mg | ORAL_TABLET | Freq: Two times a day (BID) | ORAL | 5 refills | Status: DC
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
## 2017-11-16 NOTE — Progress Notes (Signed)
THERAPY PROGRESS NOTE  Session Time:60 min  Participation [name]: Active  Behavioral Response: Well GroomedAlertDepressed  Type of Therapy: Individual Therapy  Treatment Goals addressed: Coping  Interventions: Supportive  Summary: Angela [name] is a 31 y.o. female who at first presented in a positive mood but shifted between a depressed and tearful mood throughout the session, yet displayed appropriate affect to match her emotions. From the beginning, she was open and willing to engage in dialogue about what she is currently experiencing, as well as information about her past. She responded favorably to questioning and offered detailed responses. Angela [name] disclosed that she has previously been diagnosed with Bipolar, PTSD, and "some identity confusion" disorder that she was not in favor of. Currently, she is not taking any medication for her mental health symptoms but feels that going back on some medication may be helpful for her. She is dealing with some severe medical issues and has trouble with diabetes and her kidney functioning. She feels her fatigue can be attributed to her iron deficiencies. Angela [name] experiences a great deal of anxiety around her health and stated that her biggest fear is "dying alone in her bed." She disclosed significant impairment to her daily functioning including severe depressive and anxious symptoms, what she described as daily panic attacks, intense feelings of anger and irrational reactions, as well as some symptoms of psychosis such as auditory and command hallucinations. These command hallucinations often tell her to harm another individual such as "trip that lady." She stated that "everything pisses me off." Some attention difficulties and inability to remember things, as well as avoidance of tasks that require a lot of mental effort were noted.  She stated that at times she feels down and other times feels like her mind and body are restless and full of energy  and a manic state. She disclosed extensive family history of substance abuse, as well as various mental health conditions in immediate and extended family members. Angela [name] described how important her relationships are with her family members, in particular her nieces and nephews that she described as "her world." She stated that her mental health has been on a steady decline since her most frequent unexpected break-up with a serious romantic partner. She felt she was "thrown in the trash" and left behind with no explanation. She also disclosed that she feels she is going through a religious crisis and questions what her religious beliefs and identity are at this time based on events that have occurred in her life. She wants to work through this "identity crisis" to figure out who she wants to be and how to become a healthier version of herself.   Suicidal/Homicidal: Nowithout intent/plan  Therapist Response: The Social Work Intern offered empathetic listening and initial rapport building with Angela [name]. A social determinants of health screening tool was conducted to assess for any specific resource needs Angela [name] might have at this time that may be playing a role in her emotional health. Because it was the first session, SW Intern went over confidentiality agreements, no-show policy, and began the Comprehensive Clinical Assessment with Angela [name]. SW Intern also gave Angela [name] information on how to sign up for the [employer] to access more affordable healthcare costs since she is currently without any insurance. SW Intern offered affirmations to Angela [name] for her willingness to take care of herself and encouraged her to be hopeful with the progress that is possible for her. Because it seems that Angela [name] has a complex past and extensive trauma history, SW  intern stated that she looks forward to learning more about Angela [name]'s personal and family history to learn how they can best work together. A follow-up appointment  was scheduled for 12/12 at 11am to finish the CCA and begin discussing treatment plan and goals moving forward.   Plan: Return again in 2 weeks.   Wynn Banker, [place] Work 11/16/2017

## 2017-11-16 NOTE — Patient Instructions (Signed)
Purchase Ferrous Fumarate of Ferrous Gluconate over the counter at [facility] and take one daily.  If you do not see it, ask the pharmacist to show it to you  Take Reglan 5 mg twice a day with meals.  Stop drinking sweet juices, sweet tea and sodas.  Keep appointment with your nephrologist.

## 2017-11-16 NOTE — Progress Notes (Signed)
MA contacted patient at 5:18 after visit was complete to follow up on low blood sugar.  Patient states she checked her blood sugar when she reported home and it was 110.  Patient was currently eating spaghetti while speaking with MA. No further concerns.

## 2017-11-17 NOTE — Telephone Encounter (Signed)
Patient was seen in office on 11/16/17 for hospital follow up.

## 2017-11-29 ENCOUNTER — Other Ambulatory Visit: Payer: Self-pay | Admitting: Licensed Clinical Social Worker

## 2017-12-01 ENCOUNTER — Other Ambulatory Visit: Payer: Self-pay | Admitting: Licensed Clinical Social Worker

## 2017-12-15 ENCOUNTER — Other Ambulatory Visit: Payer: Self-pay | Admitting: Licensed Clinical Social Worker

## 2017-12-21 MED FILL — ?LEVOTHYROXINE 200 MCG TAB: 200 MCG | 30 days supply | Qty: 30 | Fill #3

## 2017-12-21 MED FILL — GABAPENTIN 300 MG CAPSULE: 300 | 30 days supply | Qty: 60 | Fill #1

## 2017-12-21 MED FILL — AMLODIPINE BESYLATE 10 MG T: 10 | 30 days supply | Qty: 30 | Fill #2

## 2017-12-21 MED FILL — VIT D2 1.25 MG (50,000 UNIT: 1.25 MG | 28 days supply | Qty: 4 | Fill #2

## 2017-12-21 MED FILL — DILTIAZEM 24HR ER 120 MG CA: 120 | 30 days supply | Qty: 30 | Fill #2

## 2017-12-27 ENCOUNTER — Ambulatory Visit (INDEPENDENT_AMBULATORY_CARE_PROVIDER_SITE_OTHER): Payer: Self-pay | Admitting: Licensed Clinical Social Worker

## 2017-12-27 DIAGNOSIS — Z9189 Other specified personal risk factors, not elsewhere classified: Secondary | ICD-10-CM
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
## 2018-01-01 ENCOUNTER — Ambulatory Visit: Payer: Self-pay | Admitting: Internal Medicine

## 2018-01-04 ENCOUNTER — Ambulatory Visit (INDEPENDENT_AMBULATORY_CARE_PROVIDER_SITE_OTHER): Payer: Self-pay | Admitting: Licensed Clinical Social Worker

## 2018-01-04 DIAGNOSIS — F1220 Cannabis dependence, uncomplicated: Secondary | ICD-10-CM

## 2018-01-04 DIAGNOSIS — F4310 Post-traumatic stress disorder, unspecified: Secondary | ICD-10-CM

## 2018-01-04 DIAGNOSIS — F410 Panic disorder [episodic paroxysmal anxiety] without agoraphobia: Secondary | ICD-10-CM

## 2018-01-04 DIAGNOSIS — F319 Bipolar disorder, unspecified: Secondary | ICD-10-CM

## 2018-01-10 ENCOUNTER — Other Ambulatory Visit: Payer: Self-pay | Admitting: Licensed Clinical Social Worker
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
## 2018-01-10 NOTE — Addendum Note (Signed)
Addended by: Metta Clines on: 01/10/2018 11:36 AM   Modules accepted: Level of Service

## 2018-01-30 MED FILL — $TOUJEO SOLOSTAR 300 UNIT/M: 300 | 30 days supply | Qty: 4 | Fill #1

## 2018-01-30 MED FILL — !NOVOLOG 100UNITS/ML VIAL: 100/ML | 28 days supply | Qty: 10 | Fill #0

## 2018-02-05 ENCOUNTER — Ambulatory Visit: Payer: Self-pay | Admitting: Internal Medicine

## 2018-02-12 ENCOUNTER — Ambulatory Visit: Payer: Self-pay | Admitting: Internal Medicine

## 2018-02-12 MED FILL — ?LEVOTHYROXINE 200 MCG TAB: 200 MCG | 30 days supply | Qty: 30 | Fill #1

## 2018-02-14 ENCOUNTER — Other Ambulatory Visit: Payer: Self-pay | Admitting: Licensed Clinical Social Worker

## 2018-02-19 ENCOUNTER — Encounter: Payer: Self-pay | Admitting: Internal Medicine

## 2018-02-19 ENCOUNTER — Ambulatory Visit: Payer: Medicaid Other | Attending: Internal Medicine | Admitting: Internal Medicine

## 2018-02-19 VITALS — BP 127/84 | HR 88 | Temp 98.0°F | Resp 16 | Ht 65.0 in | Wt 142.4 lb

## 2018-02-19 DIAGNOSIS — Z87440 Personal history of urinary (tract) infections: Secondary | ICD-10-CM | POA: Diagnosis not present

## 2018-02-19 DIAGNOSIS — E108 Type 1 diabetes mellitus with unspecified complications: Secondary | ICD-10-CM

## 2018-02-19 DIAGNOSIS — E1022 Type 1 diabetes mellitus with diabetic chronic kidney disease: Secondary | ICD-10-CM | POA: Insufficient documentation

## 2018-02-19 DIAGNOSIS — E039 Hypothyroidism, unspecified: Secondary | ICD-10-CM | POA: Insufficient documentation

## 2018-02-19 DIAGNOSIS — Z881 Allergy status to other antibiotic agents status: Secondary | ICD-10-CM | POA: Insufficient documentation

## 2018-02-19 DIAGNOSIS — R0600 Dyspnea, unspecified: Secondary | ICD-10-CM | POA: Insufficient documentation

## 2018-02-19 DIAGNOSIS — N184 Chronic kidney disease, stage 4 (severe): Secondary | ICD-10-CM

## 2018-02-19 DIAGNOSIS — E1043 Type 1 diabetes mellitus with diabetic autonomic (poly)neuropathy: Secondary | ICD-10-CM | POA: Diagnosis not present

## 2018-02-19 DIAGNOSIS — I129 Hypertensive chronic kidney disease with stage 1 through stage 4 chronic kidney disease, or unspecified chronic kidney disease: Secondary | ICD-10-CM | POA: Diagnosis not present

## 2018-02-19 DIAGNOSIS — K219 Gastro-esophageal reflux disease without esophagitis: Secondary | ICD-10-CM | POA: Insufficient documentation

## 2018-02-19 DIAGNOSIS — Z794 Long term (current) use of insulin: Secondary | ICD-10-CM | POA: Diagnosis not present

## 2018-02-19 DIAGNOSIS — Z79899 Other long term (current) drug therapy: Secondary | ICD-10-CM | POA: Insufficient documentation

## 2018-02-19 DIAGNOSIS — E1065 Type 1 diabetes mellitus with hyperglycemia: Secondary | ICD-10-CM

## 2018-02-19 DIAGNOSIS — F329 Major depressive disorder, single episode, unspecified: Secondary | ICD-10-CM | POA: Diagnosis not present

## 2018-02-19 DIAGNOSIS — N179 Acute kidney failure, unspecified: Secondary | ICD-10-CM | POA: Diagnosis not present

## 2018-02-19 DIAGNOSIS — F419 Anxiety disorder, unspecified: Secondary | ICD-10-CM | POA: Diagnosis not present

## 2018-02-19 DIAGNOSIS — Z124 Encounter for screening for malignant neoplasm of cervix: Secondary | ICD-10-CM

## 2018-02-19 DIAGNOSIS — Z9119 Patient's noncompliance with other medical treatment and regimen: Secondary | ICD-10-CM | POA: Insufficient documentation

## 2018-02-19 DIAGNOSIS — D509 Iron deficiency anemia, unspecified: Secondary | ICD-10-CM

## 2018-02-19 DIAGNOSIS — IMO0002 Reserved for concepts with insufficient information to code with codable children: Secondary | ICD-10-CM

## 2018-02-19 DIAGNOSIS — D638 Anemia in other chronic diseases classified elsewhere: Secondary | ICD-10-CM

## 2018-02-19 DIAGNOSIS — Z87891 Personal history of nicotine dependence: Secondary | ICD-10-CM | POA: Diagnosis not present

## 2018-02-19 DIAGNOSIS — E785 Hyperlipidemia, unspecified: Secondary | ICD-10-CM | POA: Diagnosis not present

## 2018-02-19 DIAGNOSIS — Z888 Allergy status to other drugs, medicaments and biological substances status: Secondary | ICD-10-CM | POA: Insufficient documentation

## 2018-02-19 DIAGNOSIS — I10 Essential (primary) hypertension: Secondary | ICD-10-CM

## 2018-02-19 DIAGNOSIS — D631 Anemia in chronic kidney disease: Secondary | ICD-10-CM | POA: Diagnosis not present

## 2018-02-19 DIAGNOSIS — K3184 Gastroparesis: Secondary | ICD-10-CM | POA: Diagnosis not present

## 2018-02-19 LAB — POCT GLYCOSYLATED HEMOGLOBIN (HGB A1C): Hemoglobin A1C: 10.7

## 2018-02-19 LAB — GLUCOSE, POCT (MANUAL RESULT ENTRY): POC Glucose: 334 mg/dl — AB (ref 70–99)

## 2018-02-19 MED FILL — VIT D2 1.25 MG (50,000 UNIT: 1.25 MG | 28 days supply | Qty: 4 | Fill #3

## 2018-02-19 NOTE — Patient Instructions (Signed)
Your diabetes is not controlled.  Discontinue drinking orange juice as discussed previously.  You should take NovoLog with each meal. Check your blood sugars 2-3 times a day and keep a log.  Please take that log with you when you see the endocrinologist later this month.

## 2018-02-19 NOTE — Progress Notes (Signed)
Patient ID: Angela [name], female    DOB: Jul 27, 1986  MRN: [id number]  CC: Gynecologic Exam; Diabetes; Hypertension; and Medication Refill   Subjective: Angela [name] is a 32 y.o. female who presents for  Pap.. Her concerns today include:  Pt with hx of DM type 1 with neuropathy, gastroparesis and nephropathy, HTN, CKD stage 3-4, ACD/IDA, hypothyroid, HL  1. Pt is G0P0 -had abnormal pap in 2013.  Had bx which was neg.  No abnormal paps since -no vaginal dschg or itching at this time. -menses regular, lasting 7-9 days.  Heavy 1st three days. Has to change pad and tampon Q 1 hr during heavy days. + clots first 2 days with moderate cramps.  CT of abdomen/pelvis without contrast done 08/2017 did not reveal any enlarged uterus  2. CKD stage 4/ACD: She has seen her nephrologist Mechele Claude at [facility] in December of last year.  She was also seen by the anemia clinic and started on iron transfusions. -had 5 iron transfusions one wk apart since last visit.   -last transfusion was last wk -compliant with BP meds  3.  DM: checks BS once a day in a.m.  Gives range less than 200.  Last ate at 9:30 this a.m and had 1.5 OJ before coming out today. No low BS in past 2 wks Eating habits:  "I'm not eating as much as I need to be.  Some days I just drink OJ all day." Some days she drinks Glucerna Med: taking 40 units of Tojeo and Novolog SS; on average about 5 units SS Novolog but not every day She was approved for disability.  Has appt with endocrinology at the 03/12/2018.   Patient Active Problem List   Diagnosis Date Noted  . Anemia requiring transfusions 08/24/2017  . Dyspnea 08/24/2017  . Anxiety and depression 06/23/2017  . Insomnia 01/12/2016  . Non compliance with medical treatment 01/05/2016  . Hyperlipidemia 01/05/2016  . Flank pain   . Chronic kidney disease (CKD) stage G3a/A3, moderately decreased glomerular filtration rate (GFR) between 45-59 mL/min/1.73 square meter and  albuminuria creatinine ratio greater than 300 mg/g (HCC) 07/28/2015  . AKI (acute kidney injury) ([place]) 07/20/2015  . Recurrent UTI 02/22/2015  . Neurogenic bladder 02/20/2015  . Diabetic peripheral neuropathy associated with type 1 diabetes mellitus ([place]) 02/20/2015  . Atypical squamous cells of undetermined significance (ASCUS) on Papanicolaou smear of cervix 08/11/2014  . Diabetic gastroparesis associated with type 1 diabetes mellitus ([place]) 08/05/2014  . Anemia in chronic renal disease 08/05/2014  . HTN (hypertension) 07/11/2012  . GERD (gastroesophageal reflux disease) 09/26/2011  . Hypothyroidism 09/14/2006  . 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, with long-term current use of insulin ([place]) 01/15/2000     Current Outpatient Medications on File Prior to Visit  Medication Sig Dispense Refill  . amLODipine (NORVASC) 10 MG tablet Take 1 tablet (10 mg total) by mouth daily. (Patient taking differently: Take 10 mg by mouth at bedtime. ) 90 tablet 3  . furosemide (LASIX) 20 MG tablet Take 1 tablet (20 mg total) by mouth daily as needed for edema. 30 tablet 3  . gabapentin (NEURONTIN) 300 MG capsule Take 1 capsule (300 mg total) by mouth 2 (two) times daily. (Patient taking differently: Take 600 mg by mouth at bedtime. ) 60 capsule 3  . hydrOXYzine (ATARAX/VISTARIL) 25 MG tablet Take 1 tablet (25 mg total) by mouth at bedtime as needed. (Patient taking differently: Take 25 mg by mouth at bedtime as  needed for anxiety. ) 30 tablet 2  . insulin aspart (NOVOLOG) 100 UNIT/ML injection Inject 0-12 Units into the skin 3 (three) times daily with meals. As per sliding scale 10 mL 3  . Insulin Glargine (TOUJEO SOLOSTAR) 300 UNIT/ML SOPN Inject 50 Units into the skin at bedtime. (Patient taking differently: Inject 40 Units into the skin at bedtime. ) 6 mL 3  . levothyroxine (SYNTHROID, LEVOTHROID) 200 MCG tablet Take 1 tablet (200 mcg total) by mouth daily before breakfast. 30 tablet 5  .  metoCLOPramide (REGLAN) 5 MG tablet Take 1 tablet (5 mg total) by mouth 2 (two) times daily with a meal. 60 tablet 5  . pravastatin (PRAVACHOL) 20 MG tablet Take 20 mg by mouth daily.    [name] albuterol (PROVENTIL HFA;VENTOLIN HFA) 108 (90 Base) MCG/ACT inhaler Inhale 2 puffs into the lungs every 6 (six) hours as needed for wheezing or shortness of breath.    . diltiazem (DILACOR XR) 120 MG 24 hr capsule Take 120 mg by mouth daily.    . ergocalciferol (DRISDOL) 50000 units capsule Take 1 capsule (50,000 Units total) by mouth once a week. (Patient not taking: Reported on 02/19/2018) 9 capsule 1  . escitalopram (LEXAPRO) 10 MG tablet Take 1 tablet (10 mg total) by mouth daily. (Patient not taking: Reported on 02/19/2018) 30 tablet 2  . ketoconazole (NIZORAL) 2 % shampoo Apply 1 application topically 2 (two) times a week. 120 mL 3  . ondansetron (ZOFRAN) 4 MG tablet Take 1 tablet (4 mg total) by mouth every 6 (six) hours. (Patient not taking: Reported on 02/19/2018) 12 tablet 0  . OVER THE COUNTER MEDICATION Place 1 drop into both eyes daily as needed (dry eyes). Over the counter lubricating eye drops    . sodium bicarbonate 650 MG tablet Take 1 tablet (650 mg total) by mouth 2 (two) times daily. 60 tablet 0   No current facility-administered medications on file prior to visit.     Allergies  Allergen Reactions  . Ferumoxytol Itching  . Lisinopril Other (See Comments)    hyperkalemia  . Sulfamethoxazole Hives and Itching  . Trimethoprim Hives    Social History   Socioeconomic History  . Marital status: Divorced    Spouse name: Not on file  . Number of children: 0  . Years of education: 10th grade  . Highest education level: Not on file  Social Needs  . Financial resource strain: Not on file  . Food insecurity - worry: Not on file  . Food insecurity - inability: Not on file  . Transportation needs - medical: Not on file  . Transportation needs - non-medical: Not on file  Occupational History    . Occupation: unemployed    Comment: worked at a group  Tobacco Use  . Smoking status: Former Smoker    Packs/day: 0.25    Years: 2.00    Pack years: 0.50    Types: Cigarettes    Last attempt to quit: 03/04/2013    Years since quitting: 4.9  . Smokeless tobacco: Never Used  Substance and Sexual Activity  . Alcohol use: No    Alcohol/week: 0.0 oz  . Drug use: Yes    Types: Marijuana    Comment: last use 02/03/2016  . Sexual activity: No    Birth control/protection: None    Comment: women preference   Other Topics Concern  . Not on file  Social History Narrative   Occupation: currently unemployed   Single   Homosexual,  Lives with mom.    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            Family History  Problem Relation Age of Onset  . Multiple sclerosis Mother   . Hypothyroidism Mother   . Stroke Mother        at age 47 yo  . Hyperlipidemia Maternal Grandmother   . Hypertension Maternal Grandmother   . Heart disease Maternal Grandmother        unknown type  . Diabetes Maternal Grandmother   . Hypertension Maternal Grandfather   . Prostate cancer Maternal Grandfather   . Diabetes type I Maternal Grandfather   . Breast cancer Paternal Grandmother   . Cancer Neg Hx     Past Surgical History:  Procedure Laterality Date  . FOOT FUSION Right 2006   "put screws in it too" (09/19/2013)    ROS: Review of Systems Negative except as stated above PHYSICAL EXAM: BP 127/84   Pulse 88   Temp 98 F (36.7 C) (Oral)   Resp 16   Ht 5\' 5"  (1.651 m)   Wt 142 lb 6.4 oz (64.6 kg)   LMP 02/05/2018   SpO2 99%   BMI 23.70 kg/m   Wt Readings from Last 3  Encounters:  02/19/18 142 lb 6.4 oz (64.6 kg)  11/16/17 147 lb (66.7 kg)  11/12/17 140 lb (63.5 kg)    Physical Exam  General appearance - alert, well appearing, and in no distress Mental status - alert, oriented to person, place, and time, normal mood, behavior, speech, dress, motor activity, and thought processes Chest - clear to auscultation, no wheezes, rales or rhonchi, symmetric air entry Heart - normal rate, regular rhythm, normal S1, S2, no murmurs, rubs, clicks or gallops Abdomen - soft, nontender, nondistended, no masses or organomegaly Pelvic -CMA [place]ock present: normal external genitalia, vulva, vagina, cervix, uterus and adnexa Breast: No axillary lymphadenopathy.  No abnormal masses palpated in the breast. Extremities -trace lower extremity edema  BS 334/A1C 10.7    Chemistry      Component Value Date/Time   NA 135 02/19/2018 1439   K 5.9 (H) 02/19/2018 1439   CL 98 02/19/2018 1439   CO2 23 02/19/2018 1439   BUN 33 (H) 02/19/2018 1439   CREATININE 2.68 (H) 02/19/2018 1439   CREATININE 2.31 (H) 02/04/2016 1703      Component Value Date/Time   CALCIUM 9.7 02/19/2018 1439   ALKPHOS 113 02/19/2018 1439   AST 17 02/19/2018 1439   ALT 20 02/19/2018 1439   BILITOT 0.3 02/19/2018 1439     Lab Results  Component Value Date   WBC 4.3 02/19/2018   HGB 8.7 (L) 02/19/2018   HCT 29.3 (L) 02/19/2018   MCV 90 02/19/2018   PLT 162 02/19/2018    ASSESSMENT AND PLAN: 1. Pap smear for cervical cancer screening - Cytology - PAP  2. Type 1 diabetes mellitus with complication, uncontrolled (HCC) Not at goal. Encourage patient to discontinue drinking orange juice given the high sugar load and also the increase potassium not good given that her kidney function is poor -Encouraged  her to check blood sugars at least twice a day and keep a log of blood sugar readings.  Carry her blood sugar readings with her when she sees the endocrinologist later this month. -Advised to  take some NovoLog with every meal - POCT glucose (manual entry) - POCT glycosylated hemoglobin (Hb A1C) - Comprehensive metabolic panel  3. Essential hypertension Blood pressure actually looks good today compared to previous office visits.  Goal is 130/80 or lower.  Continue current medications.  Continue to limit salt in the foods.  4. Anemia, chronic disease 5. Iron deficiency anemia, unspecified iron deficiency anemia type - CBC - Iron, TIBC and Ferritin Panel  6. CKD (chronic kidney disease) stage 4, GFR 15-29 ml/min (HCC) Followed by nephrologist, Dr. Ronnald Ramp, at [facility]  Patient was given the opportunity to ask questions.  Patient verbalized understanding of the plan and was able to repeat key elements of the plan.   Orders Placed This Encounter  Procedures  . CBC  . Comprehensive metabolic panel  . Iron, TIBC and Ferritin Panel  . POCT glucose (manual entry)  . POCT glycosylated hemoglobin (Hb A1C)     Requested Prescriptions    No prescriptions requested or ordered in this encounter    Return in about 2 months (around 04/21/2018).  Karle Plumber, MD, FACP

## 2018-02-20 ENCOUNTER — Ambulatory Visit: Payer: Self-pay | Admitting: Internal Medicine

## 2018-02-20 ENCOUNTER — Telehealth: Payer: Self-pay | Admitting: Internal Medicine

## 2018-02-20 DIAGNOSIS — E875 Hyperkalemia: Secondary | ICD-10-CM

## 2018-02-20 LAB — COMPREHENSIVE METABOLIC PANEL
A/G RATIO: 1.7 (ref 1.2–2.2)
ALBUMIN: 4.3 g/dL (ref 3.5–5.5)
ALK PHOS: 113 IU/L (ref 39–117)
ALT: 20 IU/L (ref 0–32)
AST: 17 IU/L (ref 0–40)
BILIRUBIN TOTAL: 0.3 mg/dL (ref 0.0–1.2)
BUN / CREAT RATIO: 12 (ref 9–23)
BUN: 33 mg/dL — ABNORMAL HIGH (ref 6–20)
CHLORIDE: 98 mmol/L (ref 96–106)
CO2: 23 mmol/L (ref 20–29)
CREATININE: 2.68 mg/dL — AB (ref 0.57–1.00)
Calcium: 9.7 mg/dL (ref 8.7–10.2)
GFR calc Af Amer: 26 mL/min/{1.73_m2} — ABNORMAL LOW (ref >59)
GFR calc non Af Amer: 23 mL/min/{1.73_m2} — ABNORMAL LOW (ref >59)
GLOBULIN, TOTAL: 2.6 g/dL (ref 1.5–4.5)
Glucose: 335 mg/dL — ABNORMAL HIGH (ref 65–99)
POTASSIUM: 5.9 mmol/L — AB (ref 3.5–5.2)
SODIUM: 135 mmol/L (ref 134–144)
Total Protein: 6.9 g/dL (ref 6.0–8.5)

## 2018-02-20 LAB — IRON,TIBC AND FERRITIN PANEL
FERRITIN: 774 ng/mL — AB (ref 15–150)
Iron Saturation: 44 % (ref 15–55)
Iron: 77 ug/dL (ref 27–159)
TIBC: 176 ug/dL — AB (ref 250–450)
UIBC: 99 ug/dL — AB (ref 131–425)

## 2018-02-20 LAB — CBC
HEMATOCRIT: 29.3 % — AB (ref 34.0–46.6)
HEMOGLOBIN: 8.7 g/dL — AB (ref 11.1–15.9)
MCH: 26.8 pg (ref 26.6–33.0)
MCHC: 29.7 g/dL — AB (ref 31.5–35.7)
MCV: 90 fL (ref 79–97)
Platelets: 162 10*3/uL (ref 150–379)
RBC: 3.25 x10E6/uL — ABNORMAL LOW (ref 3.77–5.28)
RDW: 12.7 % (ref 12.3–15.4)
WBC: 4.3 10*3/uL (ref 3.4–10.8)

## 2018-02-20 MED ORDER — SODIUM POLYSTYRENE SULFONATE 15 GM/60ML PO SUSP: 15.0000 g | Freq: Once | ORAL | 0 refills | Status: AC

## 2018-02-20 NOTE — Telephone Encounter (Signed)
Phone call placed to patient this morning.  I left detailed message on her voicemail letting her know that her potassium level is elevated.  I recommend that she discontinue drinking orange juice.  Will give a one-time dose of Kayexalate.  Prescription sent to our pharmacy for patient to pick up.  I recommend repeat potassium level in 3 days.  Informed that her kidney function is unchanged.  Still anemic but stable.  Advised to give me a call if she has any questions.

## 2018-02-21 LAB — CYTOLOGY - PAP
Bacterial vaginitis: POSITIVE — AB
CHLAMYDIA, DNA PROBE: NEGATIVE
Diagnosis: NEGATIVE
HPV: NOT DETECTED
NEISSERIA GONORRHEA: NEGATIVE
TRICH (WINDOWPATH): NEGATIVE

## 2018-02-22 ENCOUNTER — Ambulatory Visit: Payer: Self-pay | Admitting: Internal Medicine

## 2018-02-22 ENCOUNTER — Other Ambulatory Visit: Payer: Self-pay | Admitting: Licensed Clinical Social Worker

## 2018-02-23 ENCOUNTER — Ambulatory Visit: Payer: Self-pay | Attending: Internal Medicine

## 2018-02-23 DIAGNOSIS — E875 Hyperkalemia: Secondary | ICD-10-CM

## 2018-02-24 LAB — POTASSIUM: POTASSIUM: 4.7 mmol/L (ref 3.5–5.2)

## 2018-02-25 ENCOUNTER — Other Ambulatory Visit: Payer: Self-pay | Admitting: Internal Medicine

## 2018-02-25 MED ORDER — METRONIDAZOLE 250 MG PO TABS: 250.0000 mg | ORAL_TABLET | Freq: Two times a day (BID) | ORAL | 0 refills | Status: DC

## 2018-02-26 MED FILL — metroNIDAZOLE 250 MG TABS: 250 | 7 days supply | Qty: 14 | Fill #0

## 2018-02-27 ENCOUNTER — Encounter (HOSPITAL_COMMUNITY): Payer: Self-pay | Admitting: Family Medicine

## 2018-02-27 ENCOUNTER — Emergency Department (HOSPITAL_COMMUNITY): Payer: Medicaid Other

## 2018-02-27 DIAGNOSIS — R0789 Other chest pain: Secondary | ICD-10-CM | POA: Diagnosis present

## 2018-02-27 DIAGNOSIS — Z5321 Procedure and treatment not carried out due to patient leaving prior to being seen by health care provider: Secondary | ICD-10-CM | POA: Insufficient documentation

## 2018-02-27 LAB — CBC
HEMATOCRIT: 27.3 % — AB (ref 36.0–46.0)
HEMOGLOBIN: 8.7 g/dL — AB (ref 12.0–15.0)
MCH: 27.6 pg (ref 26.0–34.0)
MCHC: 31.9 g/dL (ref 30.0–36.0)
MCV: 86.7 fL (ref 78.0–100.0)
Platelets: 141 10*3/uL — ABNORMAL LOW (ref 150–400)
RBC: 3.15 MIL/uL — ABNORMAL LOW (ref 3.87–5.11)
RDW: 12.5 % (ref 11.5–15.5)
WBC: 5.4 10*3/uL (ref 4.0–10.5)

## 2018-02-27 LAB — BASIC METABOLIC PANEL
ANION GAP: 9 (ref 5–15)
BUN: 30 mg/dL — AB (ref 6–20)
CO2: 24 mmol/L (ref 22–32)
Calcium: 10 mg/dL (ref 8.9–10.3)
Chloride: 104 mmol/L (ref 101–111)
Creatinine, Ser: 2.71 mg/dL — ABNORMAL HIGH (ref 0.44–1.00)
GFR, EST AFRICAN AMERICAN: 26 mL/min — AB (ref >60)
GFR, EST NON AFRICAN AMERICAN: 22 mL/min — AB (ref >60)
Glucose, Bld: 445 mg/dL — ABNORMAL HIGH (ref 65–99)
POTASSIUM: 5.3 mmol/L — AB (ref 3.5–5.1)
SODIUM: 137 mmol/L (ref 135–145)

## 2018-02-27 LAB — I-STAT TROPONIN, ED: Troponin i, poc: 0 ng/mL (ref 0.00–0.08)

## 2018-02-27 LAB — I-STAT BETA HCG BLOOD, ED (MC, WL, AP ONLY)

## 2018-02-27 LAB — CBG MONITORING, ED: Glucose-Capillary: 442 mg/dL — ABNORMAL HIGH (ref 65–99)

## 2018-02-27 NOTE — ED Triage Notes (Signed)
Patient is from home and has multiple complaints of chest pain, right flank pain, hyperglycemia, nausea, and vomiting. Symptoms started two days. Denies fever or taking any medications for symptoms. Patient reports she has took a little extra dose of insulin for her hyperglycemia.

## 2018-02-28 ENCOUNTER — Emergency Department (HOSPITAL_COMMUNITY)
Admission: EM | Admit: 2018-02-28 | Discharge: 2018-02-28 | Discharge status: Against Medical Advice | Payer: Medicaid Other | Attending: Emergency Medicine | Admitting: Emergency Medicine

## 2018-02-28 NOTE — ED Notes (Signed)
02/28/2018 ,14:44, follow-up call completed.

## 2018-02-28 NOTE — ED Notes (Signed)
Called patient to go to room and no answer

## 2018-03-01 ENCOUNTER — Other Ambulatory Visit: Payer: Self-pay

## 2018-03-01 ENCOUNTER — Emergency Department (HOSPITAL_COMMUNITY): Payer: Medicaid Other

## 2018-03-01 ENCOUNTER — Emergency Department (HOSPITAL_COMMUNITY)
Admission: EM | Admit: 2018-03-01 | Discharge: 2018-03-02 | Disposition: A | Discharge status: Home / Self Care | Payer: Medicaid Other | Attending: Emergency Medicine | Admitting: Emergency Medicine

## 2018-03-01 ENCOUNTER — Encounter (HOSPITAL_COMMUNITY): Payer: Self-pay | Admitting: Emergency Medicine

## 2018-03-01 DIAGNOSIS — E875 Hyperkalemia: Secondary | ICD-10-CM | POA: Insufficient documentation

## 2018-03-01 DIAGNOSIS — Z794 Long term (current) use of insulin: Secondary | ICD-10-CM | POA: Diagnosis not present

## 2018-03-01 DIAGNOSIS — D649 Anemia, unspecified: Secondary | ICD-10-CM

## 2018-03-01 DIAGNOSIS — N183 Chronic kidney disease, stage 3 (moderate): Secondary | ICD-10-CM | POA: Diagnosis not present

## 2018-03-01 DIAGNOSIS — E1022 Type 1 diabetes mellitus with diabetic chronic kidney disease: Secondary | ICD-10-CM | POA: Diagnosis not present

## 2018-03-01 DIAGNOSIS — J45909 Unspecified asthma, uncomplicated: Secondary | ICD-10-CM | POA: Diagnosis not present

## 2018-03-01 DIAGNOSIS — I129 Hypertensive chronic kidney disease with stage 1 through stage 4 chronic kidney disease, or unspecified chronic kidney disease: Secondary | ICD-10-CM | POA: Diagnosis not present

## 2018-03-01 DIAGNOSIS — E1065 Type 1 diabetes mellitus with hyperglycemia: Secondary | ICD-10-CM | POA: Diagnosis not present

## 2018-03-01 DIAGNOSIS — R0602 Shortness of breath: Secondary | ICD-10-CM | POA: Diagnosis present

## 2018-03-01 DIAGNOSIS — Z87891 Personal history of nicotine dependence: Secondary | ICD-10-CM | POA: Diagnosis not present

## 2018-03-01 DIAGNOSIS — Z79899 Other long term (current) drug therapy: Secondary | ICD-10-CM | POA: Diagnosis not present

## 2018-03-01 DIAGNOSIS — F329 Major depressive disorder, single episode, unspecified: Secondary | ICD-10-CM | POA: Diagnosis not present

## 2018-03-01 DIAGNOSIS — F419 Anxiety disorder, unspecified: Secondary | ICD-10-CM | POA: Diagnosis not present

## 2018-03-01 DIAGNOSIS — R739 Hyperglycemia, unspecified: Secondary | ICD-10-CM

## 2018-03-01 DIAGNOSIS — E039 Hypothyroidism, unspecified: Secondary | ICD-10-CM | POA: Diagnosis not present

## 2018-03-01 DIAGNOSIS — N289 Disorder of kidney and ureter, unspecified: Secondary | ICD-10-CM

## 2018-03-01 LAB — CBC WITH DIFFERENTIAL/PLATELET
Basophils Absolute: 0 10*3/uL (ref 0.0–0.1)
Basophils Relative: 0 %
EOS ABS: 0.1 10*3/uL (ref 0.0–0.7)
Eosinophils Relative: 2 %
HEMATOCRIT: 24.3 % — AB (ref 36.0–46.0)
HEMOGLOBIN: 7.9 g/dL — AB (ref 12.0–15.0)
LYMPHS ABS: 1.8 10*3/uL (ref 0.7–4.0)
Lymphocytes Relative: 35 %
MCH: 28 pg (ref 26.0–34.0)
MCHC: 32.5 g/dL (ref 30.0–36.0)
MCV: 86.2 fL (ref 78.0–100.0)
MONOS PCT: 5 %
Monocytes Absolute: 0.3 10*3/uL (ref 0.1–1.0)
NEUTROS ABS: 3.1 10*3/uL (ref 1.7–7.7)
NEUTROS PCT: 58 %
Platelets: 121 10*3/uL — ABNORMAL LOW (ref 150–400)
RBC: 2.82 MIL/uL — AB (ref 3.87–5.11)
RDW: 12.4 % (ref 11.5–15.5)
WBC: 5.3 10*3/uL (ref 4.0–10.5)

## 2018-03-01 LAB — URINALYSIS, ROUTINE W REFLEX MICROSCOPIC
BILIRUBIN URINE: NEGATIVE
Bacteria, UA: NONE SEEN
Glucose, UA: >=500 mg/dL — AB
KETONES UR: NEGATIVE mg/dL
Leukocytes, UA: NEGATIVE
Nitrite: NEGATIVE
PH: 6 (ref 5.0–8.0)
Protein, ur: 100 mg/dL — AB
Specific Gravity, Urine: 1.011 (ref 1.005–1.030)

## 2018-03-01 LAB — COMPREHENSIVE METABOLIC PANEL
ALK PHOS: 96 U/L (ref 38–126)
ALT: 23 U/L (ref 14–54)
ANION GAP: 9 (ref 5–15)
AST: 22 U/L (ref 15–41)
Albumin: 3.7 g/dL (ref 3.5–5.0)
BUN: 32 mg/dL — AB (ref 6–20)
CO2: 21 mmol/L — AB (ref 22–32)
Calcium: 9.3 mg/dL (ref 8.9–10.3)
Chloride: 102 mmol/L (ref 101–111)
Creatinine, Ser: 3.07 mg/dL — ABNORMAL HIGH (ref 0.44–1.00)
GFR, EST AFRICAN AMERICAN: 22 mL/min — AB (ref >60)
GFR, EST NON AFRICAN AMERICAN: 19 mL/min — AB (ref >60)
Glucose, Bld: 397 mg/dL — ABNORMAL HIGH (ref 65–99)
Potassium: 5.4 mmol/L — ABNORMAL HIGH (ref 3.5–5.1)
SODIUM: 132 mmol/L — AB (ref 135–145)
Total Bilirubin: 0.5 mg/dL (ref 0.3–1.2)
Total Protein: 6.3 g/dL — ABNORMAL LOW (ref 6.5–8.1)

## 2018-03-01 LAB — I-STAT BETA HCG BLOOD, ED (MC, WL, AP ONLY): I-stat hCG, quantitative: <5 m[IU]/mL (ref <5)

## 2018-03-01 LAB — CBG MONITORING, ED
Glucose-Capillary: 176 mg/dL — ABNORMAL HIGH (ref 65–99)
Glucose-Capillary: 420 mg/dL — ABNORMAL HIGH (ref 65–99)
Glucose-Capillary: 428 mg/dL — ABNORMAL HIGH (ref 65–99)

## 2018-03-01 MED ORDER — SODIUM CHLORIDE 0.9 % IV BOLUS (SEPSIS)
1000.0000 mL | Freq: Once | INTRAVENOUS | Status: AC
  Administered 2018-03-01: 1000 mL via INTRAVENOUS

## 2018-03-01 MED ORDER — INSULIN ASPART 100 UNIT/ML ~~LOC~~ SOLN
10.0000 [IU] | Freq: Once | SUBCUTANEOUS | Status: AC
  Administered 2018-03-01: 10 [IU] via SUBCUTANEOUS
  Filled 2018-03-01: qty 1

## 2018-03-01 MED ORDER — SODIUM POLYSTYRENE SULFONATE 15 GM/60ML PO SUSP
30.0000 g | Freq: Once | ORAL | Status: AC
  Administered 2018-03-01: 30 g via ORAL
  Filled 2018-03-01: qty 120

## 2018-03-01 NOTE — ED Notes (Signed)
Pt. To XRAY via stretcher. 

## 2018-03-01 NOTE — ED Triage Notes (Signed)
Patient was seen a few days ago for nausea and vomiting, ended up leaving without being seen but had bloodwork drawn. Returned after being called and told to return because of concerning lab values. Patient states she has 4/10 right flank pain.

## 2018-03-01 NOTE — ED Provider Notes (Signed)
Patient placed in Quick Look pathway, seen and evaluated   Chief Complaint: elevated glucose  HPI:   Pt complains of elevated glucose.  Pt was called and told to return for recheck  ROS: positive vomiting and nausea Review of Systems  Gastrointestinal: Positive for vomiting. Negative for abdominal pain and diarrhea.  All other systems reviewed and are negative.  Physical Exam:   Gen: No distress  Neuro: Awake and Alert  Skin: Warm    Focused Exam: .Lungs normal respirations Heart RRR   Initiation of care has begun. The patient has been counseled on the process, plan, and necessity for staying for the completion/evaluation, and the remainder of the medical screening examination   Sidney Ace 03/01/18 1415    Charlesetta Shanks, MD 03/02/18 1311
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
## 2018-03-02 NOTE — Discharge Instructions (Signed)
Continue to stay hydrated. Make sure to take all your regular medications. Please follow up with your doctor closely for recheck of your hemoglobin, your blood sugars, potassium, and kidney function. All were off your baseline. Return if worsening symptoms.

## 2018-03-06 ENCOUNTER — Encounter (HOSPITAL_COMMUNITY): Payer: Self-pay | Admitting: Emergency Medicine

## 2018-03-06 ENCOUNTER — Ambulatory Visit (HOSPITAL_COMMUNITY)
Admission: EM | Admit: 2018-03-06 | Discharge: 2018-03-06 | Disposition: A | Discharge status: Home / Self Care | Payer: Self-pay | Attending: Family Medicine | Admitting: Family Medicine

## 2018-03-06 DIAGNOSIS — K0889 Other specified disorders of teeth and supporting structures: Secondary | ICD-10-CM

## 2018-03-06 MED ORDER — HYDROCODONE-ACETAMINOPHEN 5-325 MG PO TABS
ORAL_TABLET | ORAL | Status: AC
  Filled 2018-03-06: qty 1

## 2018-03-06 MED ORDER — HYDROCODONE-ACETAMINOPHEN 5-325 MG PO TABS
1.0000 | ORAL_TABLET | Freq: Once | ORAL | Status: AC
  Administered 2018-03-06: 1 via ORAL

## 2018-03-06 MED ORDER — PENICILLIN V POTASSIUM 500 MG PO TABS: 500.0000 mg | ORAL_TABLET | Freq: Four times a day (QID) | ORAL | 0 refills | Status: AC

## 2018-03-06 MED ORDER — CHLORHEXIDINE GLUCONATE 0.12 % MT SOLN: 15.0000 mL | Freq: Two times a day (BID) | OROMUCOSAL | 0 refills | Status: DC

## 2018-03-06 MED ORDER — HYDROCODONE-ACETAMINOPHEN 5-325 MG PO TABS: 1.0000 | ORAL_TABLET | Freq: Four times a day (QID) | ORAL | 0 refills | Status: DC | PRN

## 2018-03-06 NOTE — Discharge Instructions (Signed)
Start Penicillin as directed for dental abscess. Norco and oragel for pain. Peridex for dental hygiene. Follow up with dentist for further treatment and evaluation. If experiencing swelling of the throat, trouble breathing, trouble swallowing, leaning forward to breath, drooling, go to the emergency department for further evaluation.

## 2018-03-06 NOTE — ED Notes (Signed)
Friend is driving.   Patient [facility], too late to get at [facility].  Informed patient narcotic would remain at [facility], only antibiotic would be re[facility].  [facility] not fill narcotic scripts.

## 2018-03-06 NOTE — ED Triage Notes (Signed)
Pt here for left sided dental pain x 2 days
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
## 2018-03-12 ENCOUNTER — Ambulatory Visit (INDEPENDENT_AMBULATORY_CARE_PROVIDER_SITE_OTHER): Payer: Medicaid Other | Admitting: Endocrinology

## 2018-03-12 DIAGNOSIS — E1029 Type 1 diabetes mellitus with other diabetic kidney complication: Secondary | ICD-10-CM

## 2018-03-12 MED ORDER — INSULIN ASPART 100 UNIT/ML ~~LOC~~ SOLN
4.0000 [IU] | Freq: Three times a day (TID) | SUBCUTANEOUS | 3 refills | Status: DC
Start: 2018-03-12 — End: 2018-11-26

## 2018-03-12 MED ORDER — INSULIN GLARGINE 300 UNIT/ML ~~LOC~~ SOPN: 50.0000 [IU] | PEN_INJECTOR | SUBCUTANEOUS | 3 refills | Status: DC
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
## 2018-03-12 NOTE — Patient Instructions (Addendum)
good diet and exercise significantly improve the control of your diabetes.  please let me know if you wish to be referred to a dietician.  high blood sugar is very risky to your health.  you should see an eye doctor and dentist every year.  It is very important to get all recommended vaccinations.  good diet and exercise significantly improve the control of your diabetes.  please let me know if you wish to be referred to a dietician.  high blood sugar is very risky to your health.  you should see an eye doctor and dentist every year.  It is very important to get all recommended vaccinations.  check your blood sugar 4 times a day: before the 3 meals, and at bedtime.  also check if you have symptoms of your blood sugar being too high or too low.  please keep a record of the readings and bring it to your next appointment here (or you can bring the meter itself).  You can write it on any piece of paper.  please call us sooner if your blood sugar goes below 70, or if you have a lot of readings over 200.  We will need to take this complex situation in stages For now, please: Change the toujeo to 50 units each morning. Take novolog only 4 units at a time, and take only if blood sugar is over 300.  On this type of insulin schedule, you should eat meals on a regular schedule.  If a meal is missed or significantly delayed, your blood sugar could go low.  Please come back for a follow-up appointment in 2 weeks.

## 2018-03-13 DIAGNOSIS — R079 Chest pain, unspecified: Secondary | ICD-10-CM | POA: Diagnosis not present

## 2018-03-13 DIAGNOSIS — R000 Tachycardia, unspecified: Secondary | ICD-10-CM | POA: Diagnosis not present

## 2018-03-20 ENCOUNTER — Other Ambulatory Visit: Payer: Self-pay

## 2018-03-20 ENCOUNTER — Telehealth: Payer: Self-pay | Admitting: Internal Medicine

## 2018-03-20 ENCOUNTER — Ambulatory Visit: Payer: Medicaid Other | Attending: Internal Medicine | Admitting: Physician Assistant

## 2018-03-20 VITALS — BP 156/96 | HR 94 | Temp 98.0°F | Resp 18 | Ht 65.0 in | Wt 146.4 lb

## 2018-03-20 DIAGNOSIS — K3184 Gastroparesis: Secondary | ICD-10-CM | POA: Diagnosis not present

## 2018-03-20 DIAGNOSIS — I10 Essential (primary) hypertension: Secondary | ICD-10-CM | POA: Insufficient documentation

## 2018-03-20 DIAGNOSIS — Z79890 Hormone replacement therapy (postmenopausal): Secondary | ICD-10-CM | POA: Diagnosis not present

## 2018-03-20 DIAGNOSIS — Z79899 Other long term (current) drug therapy: Secondary | ICD-10-CM | POA: Insufficient documentation

## 2018-03-20 DIAGNOSIS — F419 Anxiety disorder, unspecified: Secondary | ICD-10-CM

## 2018-03-20 DIAGNOSIS — E1043 Type 1 diabetes mellitus with diabetic autonomic (poly)neuropathy: Secondary | ICD-10-CM

## 2018-03-20 DIAGNOSIS — K0889 Other specified disorders of teeth and supporting structures: Secondary | ICD-10-CM | POA: Insufficient documentation

## 2018-03-20 DIAGNOSIS — Z794 Long term (current) use of insulin: Secondary | ICD-10-CM | POA: Insufficient documentation

## 2018-03-20 DIAGNOSIS — K029 Dental caries, unspecified: Secondary | ICD-10-CM | POA: Insufficient documentation

## 2018-03-20 DIAGNOSIS — E875 Hyperkalemia: Secondary | ICD-10-CM | POA: Diagnosis not present

## 2018-03-20 DIAGNOSIS — R29818 Other symptoms and signs involving the nervous system: Secondary | ICD-10-CM | POA: Insufficient documentation

## 2018-03-20 DIAGNOSIS — F329 Major depressive disorder, single episode, unspecified: Secondary | ICD-10-CM

## 2018-03-20 LAB — GLUCOSE, POCT (MANUAL RESULT ENTRY)
POC GLUCOSE: 62 mg/dL — AB (ref 70–99)
POC GLUCOSE: 50 mg/dL — AB (ref 70–99)

## 2018-03-20 MED ORDER — AMOXICILLIN-POT CLAVULANATE 875-125 MG PO TABS: 1.0000 | ORAL_TABLET | Freq: Two times a day (BID) | ORAL | 0 refills | Status: DC

## 2018-03-20 MED ORDER — DILTIAZEM HCL ER COATED BEADS 240 MG PO CP24: 240.0000 mg | ORAL_CAPSULE | Freq: Every day | ORAL | 3 refills | Status: DC

## 2018-03-20 MED ORDER — ESCITALOPRAM OXALATE 10 MG PO TABS: 10.0000 mg | ORAL_TABLET | Freq: Every day | ORAL | 2 refills | Status: DC

## 2018-03-20 NOTE — Telephone Encounter (Signed)
Patient was seen in the office today and she called back asking for pain medication for her tooth.

## 2018-03-20 NOTE — Progress Notes (Signed)
Chief Complaint: ED follow up  Subjective: This is a 32 year old female with multiple medical issues. She was last seen here on 02/19/2018 by her primary care provider. Since then she's been to the emergency department at least 3-4 times. The first time was 03/01/2018 for nausea, vomiting and diarrhea. She didn't have time to stay at our he had labs drawn. She left AGAINST MEDICAL ADVICE and the hospital called her back without him allergies her labs. This was notable for hemoglobin of 7.9. Potassium of 5.4. Creatinine 3.0. She returned and was hydrated, given Kayexalate and insulin.   She presented on 03/06/2018 with dental pain on the left lower side of the mouth. There was swelling. There was drainage. Her blood pressure was high with systolic 161. She was treated with pain pills and penicillin.  She [facility] on 03/[facility] with chest pain. Anterior chest described as a pressure. Radiation to bilateral flanks. Worse with palpation. Workup essentially normal with the exception of her chronic abnormalities in her lab work.  Since release from these facilities she states that the dental pain on the left side temporarily got better but has now returned. She is holding her jaw during the interview today. She reeks of marijuana unfortunately. Her chest pain has subsided. No palpitations per se. She does feel like her heart beats in her ear. This has been bothering her 2 weeks. She is wondering if is related to elevations in her blood pressure.   ROS:  GEN: denies fever or chills, denies change in weight Skin: denies lesions or rashes HEENT: denies headache, earache, epistaxis, sore throat, or neck pain; +dental pain and caries LUNGS: denies SHOB, dyspnea, PND, orthopnea CV: denies CP or palpitations ABD: denies abd pain, N or V EXT: denies muscle spasms or swelling; no pain in lower ext, no weakness NEURO: denies numbness or tingling, denies sz, stroke or  TIA   Objective:  Vitals:   03/20/18 0923  BP: (!) 156/96  Pulse: 94  Resp: 18  Temp: 98 F (36.7 C)  TempSrc: Oral  SpO2: 100%  Weight: 146 lb 6.4 oz (66.4 kg)  Height: 5\' 5"  (1.651 m)    Physical Exam:  General: in no acute distress. HEENT:  2 molars surgically removed, muli caries, +pus 1st mola on left lower side Heart: Normal  s1 &s2  Regular rate and rhythm, without murmurs, rubs, gallops. Lungs: Clear to auscultation bilaterally. Abdomen: Soft, nontender, nondistended, positive bowel sounds. Extremities: No clubbing cyanosis or edema with positive pedal pulses. Neuro: Alert, awake, oriented x3, nonfocal.   Medications: Prior to Admission medications   Medication Sig Start Date End Date Taking? Authorizing Provider  albuterol (PROVENTIL HFA;VENTOLIN HFA) 108 (90 Base) MCG/ACT inhaler Inhale 2 puffs into the lungs every 6 (six) hours as needed for wheezing or shortness of breath.   Yes [provider]  amLODipine (NORVASC) 10 MG tablet Take 1 tablet (10 mg total) by mouth daily. Patient taking differently: Take 10 mg by mouth at bedtime.  07/18/17  Yes Funches, Josalyn, MD  diltiazem (CARTIA XT) 240 MG 24 hr capsule Take 1 capsule (240 mg total) by mouth daily. 03/20/18  Yes Ena Dawley, Tiffany S, PA-C  ergocalciferol (DRISDOL) 50000 units capsule Take 1 capsule (50,000 Units total) by mouth once a week. 09/15/17  Yes Charlott Rakes, MD  gabapentin (NEURONTIN) 300 MG capsule Take 1 capsule (300 mg total) by mouth 2 (two) times daily. Patient taking differently: Take 600 mg by mouth at bedtime.  08/08/17  Yes [name], MD  insulin aspart (NOVOLOG) 100 UNIT/ML injection Inject 4 Units into the skin 3 (three) times daily with meals. Take only if blood sugar is over 300. 03/12/18  Yes [name], MD  Insulin Glargine (TOUJEO SOLOSTAR) 300 UNIT/ML SOPN Inject 50 Units into the skin every morning. And pen needles 2/day 03/12/18  Yes [name], MD  levothyroxine  (SYNTHROID, LEVOTHROID) 200 MCG tablet Take 1 tablet (200 mcg total) by mouth daily before breakfast. 06/23/17  Yes Funches, Josalyn, MD  metoCLOPramide (REGLAN) 5 MG tablet Take 1 tablet (5 mg total) by mouth 2 (two) times daily with a meal. 11/16/17  Yes [name], MD  pravastatin (PRAVACHOL) 20 MG tablet Take 20 mg by mouth daily.   Yes [provider]  sodium bicarbonate 650 MG tablet Take 1 tablet (650 mg total) by mouth 2 (two) times daily. 08/26/17  Yes Robbie Lis, MD  amoxicillin-clavulanate (AUGMENTIN) 875-125 MG tablet Take 1 tablet by mouth 2 (two) times daily. 03/20/18   Brayton Caves, PA-C  chlorhexidine (PERIDEX) 0.12 % solution Use as directed 15 mLs in the mouth or throat 2 (two) times daily. Patient not taking: Reported on 03/20/2018 03/06/18   Ok Edwards, PA-C  escitalopram (LEXAPRO) 10 MG tablet Take 1 tablet (10 mg total) by mouth daily. 03/20/18   Brayton Caves, PA-C  furosemide (LASIX) 20 MG tablet Take 1 tablet (20 mg total) by mouth daily as needed for edema. Patient not taking: Reported on 03/20/2018 09/21/17   [name], MD  HYDROcodone-acetaminophen (NORCO/VICODIN) 5-325 MG tablet Take 1 tablet by mouth every 6 (six) hours as needed. Patient not taking: Reported on 03/20/2018 03/06/18   Ok Edwards, PA-C  hydrOXYzine (ATARAX/VISTARIL) 25 MG tablet Take 1 tablet (25 mg total) by mouth at bedtime as needed. Patient not taking: Reported on 03/20/2018 12/30/16   Boykin Nearing, MD  ketoconazole (NIZORAL) 2 % shampoo Apply 1 application topically 2 (two) times a week. Patient not taking: Reported on 03/20/2018 08/31/17   Charlott Rakes, MD  ondansetron (ZOFRAN) 4 MG tablet Take 1 tablet (4 mg total) by mouth every 6 (six) hours. Patient not taking: Reported on 02/19/2018 11/12/17   Frederica Kuster, PA-C  OVER THE COUNTER MEDICATION Place 1 drop into both eyes daily as needed (dry eyes). Over the counter lubricating eye drops    [provider]     Assessment: 1. Left Lower dental pain/abcess/caries 2. Heart throbbing in Ear 3. HTN 4. Atypical CP-resolved  Plan: Augmentin for 10 days [facility] No further workup of CP, ? Anxiety playing a role and encouraged her to restart Lexapro  Follow up: 4 weeks  The patient was given clear instructions to go to ER or return to medical center if symptoms don't improve, worsen or new problems develop. The patient verbalized understanding. The patient was told to call to get lab results if they haven't heard anything in the next week.   This note has been created with Surveyor, quantity. Any transcriptional errors are unintentional.   Zettie Pho, PA-C 03/20/2018, 11:11 AM

## 2018-03-20 NOTE — Progress Notes (Signed)
JA 

## 2018-03-21 LAB — BASIC METABOLIC PANEL
BUN/Creatinine Ratio: 10 (ref 9–23)
BUN: 23 mg/dL — ABNORMAL HIGH (ref 6–20)
CALCIUM: 9.9 mg/dL (ref 8.7–10.2)
CHLORIDE: 102 mmol/L (ref 96–106)
CO2: 22 mmol/L (ref 20–29)
Creatinine, Ser: 2.34 mg/dL — ABNORMAL HIGH (ref 0.57–1.00)
GFR calc Af Amer: 31 mL/min/{1.73_m2} — ABNORMAL LOW (ref >59)
GFR calc non Af Amer: 27 mL/min/{1.73_m2} — ABNORMAL LOW (ref >59)
Glucose: 60 mg/dL — ABNORMAL LOW (ref 65–99)
Potassium: 5.1 mmol/L (ref 3.5–5.2)
Sodium: 140 mmol/L (ref 134–144)

## 2018-03-27 ENCOUNTER — Other Ambulatory Visit: Payer: Self-pay | Admitting: Nurse Practitioner

## 2018-03-27 ENCOUNTER — Ambulatory Visit: Payer: Medicaid Other | Admitting: Endocrinology

## 2018-03-27 MED ORDER — ACETAMINOPHEN 500 MG PO TABS: 500.0000 mg | ORAL_TABLET | Freq: Four times a day (QID) | ORAL | 0 refills | Status: AC | PRN

## 2018-03-27 NOTE — Telephone Encounter (Signed)
XS tylenol has been sent to the pharmacy. She will need to follow up with Dr. Wynetta Emery upon her return to anything stronger

## 2018-03-28 NOTE — Telephone Encounter (Signed)
Called and informed patient that the medication had been sent to the Pharmacy

## 2018-04-02 MED FILL — VIT D2 1.25 MG (50,000 UNIT: 1.25 MG | 13 days supply | Qty: 2 | Fill #4

## 2018-04-02 MED FILL — AMOX-CLAV 875-125 MG TABLET: 875-125 | 10 days supply | Qty: 20 | Fill #0

## 2018-04-02 MED FILL — DILTIAZEM 24HR ER 240 MG CA: 240 | 30 days supply | Qty: 30 | Fill #0

## 2018-04-02 MED FILL — LEVOTHYROXINE 200 MCG TAB: 200 | 30 days supply | Qty: 30 | Fill #2

## 2018-04-02 MED FILL — ESCITALOPRAM 10 MG TABLET: 10 | 30 days supply | Qty: 30 | Fill #0

## 2018-04-09 MED FILL — CLINDAMYCIN HCL 150 MG CAPS: 150 | 7 days supply | Qty: 28 | Fill #0

## 2018-04-09 MED FILL — IBUPROFEN 800 MG TABLET: 800 | 7 days supply | Qty: 20 | Fill #0

## 2018-04-09 MED FILL — ACETAMINOPHEN/COD #3 TABLET: 300-30 | 3 days supply | Qty: 20 | Fill #0

## 2018-04-11 ENCOUNTER — Ambulatory Visit: Payer: Medicaid Other | Attending: Internal Medicine | Admitting: Physician Assistant

## 2018-04-11 VITALS — BP 132/84 | HR 97 | Temp 98.2°F | Resp 18 | Ht 65.0 in | Wt 136.0 lb

## 2018-04-11 DIAGNOSIS — N183 Chronic kidney disease, stage 3 (moderate): Secondary | ICD-10-CM | POA: Insufficient documentation

## 2018-04-11 DIAGNOSIS — J40 Bronchitis, not specified as acute or chronic: Secondary | ICD-10-CM

## 2018-04-11 DIAGNOSIS — E1029 Type 1 diabetes mellitus with other diabetic kidney complication: Secondary | ICD-10-CM | POA: Diagnosis not present

## 2018-04-11 DIAGNOSIS — E1022 Type 1 diabetes mellitus with diabetic chronic kidney disease: Secondary | ICD-10-CM | POA: Insufficient documentation

## 2018-04-11 DIAGNOSIS — Z79899 Other long term (current) drug therapy: Secondary | ICD-10-CM | POA: Insufficient documentation

## 2018-04-11 DIAGNOSIS — E1040 Type 1 diabetes mellitus with diabetic neuropathy, unspecified: Secondary | ICD-10-CM | POA: Insufficient documentation

## 2018-04-11 DIAGNOSIS — Z79890 Hormone replacement therapy (postmenopausal): Secondary | ICD-10-CM | POA: Diagnosis not present

## 2018-04-11 DIAGNOSIS — Z888 Allergy status to other drugs, medicaments and biological substances status: Secondary | ICD-10-CM | POA: Diagnosis not present

## 2018-04-11 DIAGNOSIS — E785 Hyperlipidemia, unspecified: Secondary | ICD-10-CM | POA: Insufficient documentation

## 2018-04-11 DIAGNOSIS — E039 Hypothyroidism, unspecified: Secondary | ICD-10-CM | POA: Diagnosis not present

## 2018-04-11 DIAGNOSIS — I10 Essential (primary) hypertension: Secondary | ICD-10-CM

## 2018-04-11 DIAGNOSIS — I129 Hypertensive chronic kidney disease with stage 1 through stage 4 chronic kidney disease, or unspecified chronic kidney disease: Secondary | ICD-10-CM | POA: Insufficient documentation

## 2018-04-11 DIAGNOSIS — Z882 Allergy status to sulfonamides status: Secondary | ICD-10-CM | POA: Diagnosis not present

## 2018-04-11 DIAGNOSIS — F329 Major depressive disorder, single episode, unspecified: Secondary | ICD-10-CM | POA: Insufficient documentation

## 2018-04-11 DIAGNOSIS — Z794 Long term (current) use of insulin: Secondary | ICD-10-CM | POA: Diagnosis not present

## 2018-04-11 DIAGNOSIS — Z881 Allergy status to other antibiotic agents status: Secondary | ICD-10-CM | POA: Insufficient documentation

## 2018-04-11 DIAGNOSIS — R05 Cough: Secondary | ICD-10-CM | POA: Diagnosis present

## 2018-04-11 DIAGNOSIS — K219 Gastro-esophageal reflux disease without esophagitis: Secondary | ICD-10-CM | POA: Insufficient documentation

## 2018-04-11 DIAGNOSIS — G4700 Insomnia, unspecified: Secondary | ICD-10-CM | POA: Insufficient documentation

## 2018-04-11 LAB — GLUCOSE, POCT (MANUAL RESULT ENTRY): POC GLUCOSE: 137 mg/dL — AB (ref 70–99)

## 2018-04-11 MED ORDER — ALBUTEROL SULFATE HFA 108 (90 BASE) MCG/ACT IN AERS: 2.0000 | INHALATION_SPRAY | Freq: Four times a day (QID) | RESPIRATORY_TRACT | 1 refills | Status: DC | PRN

## 2018-04-11 MED ORDER — AMLODIPINE BESYLATE 10 MG PO TABS: 10.0000 mg | ORAL_TABLET | Freq: Every day | ORAL | 3 refills | Status: DC

## 2018-04-11 MED ORDER — BENZONATATE 100 MG PO CAPS: 200.0000 mg | ORAL_CAPSULE | Freq: Three times a day (TID) | ORAL | 0 refills | Status: DC | PRN

## 2018-04-11 MED ORDER — AZITHROMYCIN 250 MG PO TABS: ORAL_TABLET | ORAL | 0 refills | Status: DC

## 2018-04-11 MED FILL — PROAIR HFA 90 MCG INHALER: 108 (90 BAS | 25 days supply | Qty: 9 | Fill #0

## 2018-04-11 MED FILL — AMLODIPINE BESYLATE 10 MG T: 10 | 30 days supply | Qty: 30 | Fill #0

## 2018-04-11 MED FILL — AZITHROMYCIN 250 MG TABLET: 250 | 5 days supply | Qty: 6 | Fill #0
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
## 2018-04-16 ENCOUNTER — Ambulatory Visit: Payer: Medicaid Other | Admitting: Endocrinology

## 2018-04-16 ENCOUNTER — Other Ambulatory Visit: Payer: Self-pay

## 2018-04-16 ENCOUNTER — Ambulatory Visit: Payer: Medicaid Other | Attending: Internal Medicine | Admitting: Internal Medicine

## 2018-04-16 ENCOUNTER — Encounter: Payer: Self-pay | Admitting: Internal Medicine

## 2018-04-16 VITALS — BP 105/70 | HR 90 | Temp 98.7°F | Resp 16 | Wt 139.4 lb

## 2018-04-16 DIAGNOSIS — E1022 Type 1 diabetes mellitus with diabetic chronic kidney disease: Secondary | ICD-10-CM | POA: Insufficient documentation

## 2018-04-16 DIAGNOSIS — E1029 Type 1 diabetes mellitus with other diabetic kidney complication: Secondary | ICD-10-CM | POA: Diagnosis not present

## 2018-04-16 DIAGNOSIS — I10 Essential (primary) hypertension: Secondary | ICD-10-CM

## 2018-04-16 DIAGNOSIS — E162 Hypoglycemia, unspecified: Secondary | ICD-10-CM | POA: Diagnosis not present

## 2018-04-16 DIAGNOSIS — J40 Bronchitis, not specified as acute or chronic: Secondary | ICD-10-CM | POA: Diagnosis not present

## 2018-04-16 DIAGNOSIS — K219 Gastro-esophageal reflux disease without esophagitis: Secondary | ICD-10-CM | POA: Insufficient documentation

## 2018-04-16 DIAGNOSIS — E039 Hypothyroidism, unspecified: Secondary | ICD-10-CM | POA: Diagnosis not present

## 2018-04-16 DIAGNOSIS — Z882 Allergy status to sulfonamides status: Secondary | ICD-10-CM | POA: Insufficient documentation

## 2018-04-16 DIAGNOSIS — Z888 Allergy status to other drugs, medicaments and biological substances status: Secondary | ICD-10-CM | POA: Insufficient documentation

## 2018-04-16 DIAGNOSIS — Z881 Allergy status to other antibiotic agents status: Secondary | ICD-10-CM | POA: Diagnosis not present

## 2018-04-16 DIAGNOSIS — E10649 Type 1 diabetes mellitus with hypoglycemia without coma: Secondary | ICD-10-CM | POA: Insufficient documentation

## 2018-04-16 DIAGNOSIS — N183 Chronic kidney disease, stage 3 (moderate): Secondary | ICD-10-CM | POA: Diagnosis not present

## 2018-04-16 DIAGNOSIS — Z87891 Personal history of nicotine dependence: Secondary | ICD-10-CM | POA: Insufficient documentation

## 2018-04-16 DIAGNOSIS — R0602 Shortness of breath: Secondary | ICD-10-CM | POA: Diagnosis not present

## 2018-04-16 DIAGNOSIS — Z79899 Other long term (current) drug therapy: Secondary | ICD-10-CM | POA: Diagnosis not present

## 2018-04-16 DIAGNOSIS — I129 Hypertensive chronic kidney disease with stage 1 through stage 4 chronic kidney disease, or unspecified chronic kidney disease: Secondary | ICD-10-CM | POA: Insufficient documentation

## 2018-04-16 DIAGNOSIS — Z794 Long term (current) use of insulin: Secondary | ICD-10-CM | POA: Diagnosis not present

## 2018-04-16 DIAGNOSIS — Z833 Family history of diabetes mellitus: Secondary | ICD-10-CM | POA: Diagnosis not present

## 2018-04-16 DIAGNOSIS — Z823 Family history of stroke: Secondary | ICD-10-CM | POA: Diagnosis not present

## 2018-04-16 DIAGNOSIS — Z803 Family history of malignant neoplasm of breast: Secondary | ICD-10-CM | POA: Diagnosis not present

## 2018-04-16 DIAGNOSIS — Z9889 Other specified postprocedural states: Secondary | ICD-10-CM | POA: Insufficient documentation

## 2018-04-16 DIAGNOSIS — Z8249 Family history of ischemic heart disease and other diseases of the circulatory system: Secondary | ICD-10-CM | POA: Insufficient documentation

## 2018-04-16 LAB — GLUCOSE, POCT (MANUAL RESULT ENTRY)
POC Glucose: 131 mg/dl — AB (ref 70–99)
POC Glucose: 53 mg/dl — AB (ref 70–99)
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
## 2018-04-16 NOTE — Patient Instructions (Signed)
Decrease Toujeo to 25 units daily until you see the endocrinologist

## 2018-04-17 LAB — TSH: TSH: 1.57 u[IU]/mL (ref 0.450–4.500)

## 2018-04-19 ENCOUNTER — Ambulatory Visit (INDEPENDENT_AMBULATORY_CARE_PROVIDER_SITE_OTHER): Payer: Medicaid Other | Admitting: Endocrinology

## 2018-04-19 ENCOUNTER — Other Ambulatory Visit: Payer: Self-pay

## 2018-04-19 ENCOUNTER — Telehealth: Payer: Self-pay | Admitting: Endocrinology

## 2018-04-19 ENCOUNTER — Encounter: Payer: Self-pay | Admitting: Endocrinology

## 2018-04-19 VITALS — BP 110/68 | HR 90 | Wt 139.4 lb

## 2018-04-19 DIAGNOSIS — E1029 Type 1 diabetes mellitus with other diabetic kidney complication: Secondary | ICD-10-CM | POA: Diagnosis not present

## 2018-04-19 LAB — POCT GLYCOSYLATED HEMOGLOBIN (HGB A1C): Hemoglobin A1C: 8.9

## 2018-04-19 MED ORDER — INSULIN ISOPHANE HUMAN 100 UNIT/ML KWIKPEN: 50.0000 [IU] | PEN_INJECTOR | SUBCUTANEOUS | 11 refills | Status: DC

## 2018-04-19 NOTE — Patient Instructions (Addendum)
check your blood sugar 4 times a day: before the 3 meals, and at bedtime.  also check if you have symptoms of your blood sugar being too high or too low.  please keep a record of the readings and bring it to your next appointment here (or you can bring the meter itself).  You can write it on any piece of paper.  please call us sooner if your blood sugar goes below 70, or if you have a lot of readings over 200.  We will need to take this complex situation in stages.   For now, please:  Change the toujeo to NPH, 50 units each morning.  Continue to take novolog only 4 units at a time, and take only if blood sugar is over 300.  On this type of insulin schedule, you should eat meals on a regular schedule.  If a meal is missed or significantly delayed, your blood sugar could go low.  Please come back for a follow-up appointment in 3-4 weeks.   Please continue the same thyroid medication for now, but we'll recheck this in the future.

## 2018-04-19 NOTE — Telephone Encounter (Signed)
[place], [place]: they received a Script for Humalin N Quik Pens -in the directions they were told to dispense pen needles-that does not constitute as a prescription. Needs to be corrected and resent to [facility] on [address]
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
## 2018-04-20 ENCOUNTER — Other Ambulatory Visit: Payer: Self-pay

## 2018-04-20 MED ORDER — INSULIN PEN NEEDLE 32G X 4 MM MISC: 11 refills | Status: DC

## 2018-04-20 MED ORDER — INSULIN ISOPHANE HUMAN 100 UNIT/ML KWIKPEN: 50.0000 [IU] | PEN_INJECTOR | SUBCUTANEOUS | 11 refills | Status: DC

## 2018-04-20 NOTE — Telephone Encounter (Signed)
I have called [place] to clarify. I resent prescription to [facility] with a separate script for pen needles.

## 2018-04-24 ENCOUNTER — Other Ambulatory Visit: Payer: Self-pay

## 2018-04-24 ENCOUNTER — Ambulatory Visit: Payer: Medicaid Other | Admitting: Internal Medicine

## 2018-04-24 ENCOUNTER — Telehealth: Payer: Self-pay | Admitting: Endocrinology

## 2018-04-24 MED ORDER — INSULIN NPH (HUMAN) (ISOPHANE) 100 UNIT/ML ~~LOC~~ SUSP: SUBCUTANEOUS | 11 refills | Status: DC

## 2018-04-24 MED ORDER — "INSULIN SYRINGE-NEEDLE U-100 31G X 15/64"" 1 ML MISC": 11 refills | Status: DC

## 2018-04-24 NOTE — Telephone Encounter (Signed)
Pt states she's mobile and does not stay in one place and it's hard to have the vials of insulin. Pt states she has had the pens for 8 years. Please advise? Thank you!  Call pt @ [phone].

## 2018-04-24 NOTE — Telephone Encounter (Signed)
Ok, please advise pt we'll need to change to vials.

## 2018-04-24 NOTE — Telephone Encounter (Signed)
Please advise. Humulin Kwikpens usually don't get approved by PA's of course unless there is some medical reason & best I can tell patient doesn't have one. Please advise on changes?

## 2018-04-24 NOTE — Telephone Encounter (Signed)
I have called the patient & LVM. I stated that unless she had some type of vision or dexterity issues that a PA would most likely not be approved. I called pharmacy to see if I needed to sent in Humulin or Novolin & they stated that it just came back as needing PA. Since she is a medicaid patient I believe that Humulin is covered just not the pens. I have sent in vials plus syringes.

## 2018-04-24 NOTE — Telephone Encounter (Signed)
Patients is calling in regards to a PA that was suppose to be started and would like to talk to someone to see if this has been started. She states she has been without her insulin for over a week now. Please advise    Insulin NPH, Human,, Isophane, (HUMULIN N KWIKPEN) 100 UNIT/ML [employer]

## 2018-04-25 ENCOUNTER — Other Ambulatory Visit: Payer: Self-pay

## 2018-04-25 MED ORDER — INSULIN DETEMIR 100 UNIT/ML FLEXPEN: 50.0000 [IU] | PEN_INJECTOR | SUBCUTANEOUS | 11 refills | Status: DC

## 2018-04-25 NOTE — Telephone Encounter (Signed)
Ok, please take the levemir pen, 50 units each morning.  I have sent a prescription to your pharmacy.

## 2018-04-25 NOTE — Telephone Encounter (Signed)
Patient was upset & does not want vials at all. She states she is on the go too much for vials.

## 2018-04-25 NOTE — Telephone Encounter (Signed)
Patient notified that new prescription was sent to pharmacy.

## 2018-04-25 NOTE — Addendum Note (Signed)
Addended by: [name] on: 04/25/2018 10:18 AM   Modules accepted: Orders

## 2018-05-01 DIAGNOSIS — N2581 Secondary hyperparathyroidism of renal origin: Secondary | ICD-10-CM | POA: Insufficient documentation

## 2018-05-03 ENCOUNTER — Ambulatory Visit: Payer: Medicaid Other | Admitting: Podiatry

## 2018-05-03 ENCOUNTER — Encounter: Payer: Self-pay | Admitting: Internal Medicine

## 2018-05-03 ENCOUNTER — Ambulatory Visit: Payer: Medicaid Other | Attending: Internal Medicine | Admitting: Internal Medicine

## 2018-05-03 VITALS — BP 146/99 | HR 96 | Temp 98.2°F | Resp 16 | Wt 134.4 lb

## 2018-05-03 DIAGNOSIS — E785 Hyperlipidemia, unspecified: Secondary | ICD-10-CM | POA: Diagnosis not present

## 2018-05-03 DIAGNOSIS — N184 Chronic kidney disease, stage 4 (severe): Secondary | ICD-10-CM | POA: Diagnosis not present

## 2018-05-03 DIAGNOSIS — E1043 Type 1 diabetes mellitus with diabetic autonomic (poly)neuropathy: Secondary | ICD-10-CM

## 2018-05-03 DIAGNOSIS — K3184 Gastroparesis: Secondary | ICD-10-CM | POA: Diagnosis not present

## 2018-05-03 DIAGNOSIS — Z881 Allergy status to other antibiotic agents status: Secondary | ICD-10-CM | POA: Insufficient documentation

## 2018-05-03 DIAGNOSIS — F419 Anxiety disorder, unspecified: Secondary | ICD-10-CM | POA: Insufficient documentation

## 2018-05-03 DIAGNOSIS — Z79899 Other long term (current) drug therapy: Secondary | ICD-10-CM | POA: Diagnosis not present

## 2018-05-03 DIAGNOSIS — F329 Major depressive disorder, single episode, unspecified: Secondary | ICD-10-CM | POA: Insufficient documentation

## 2018-05-03 DIAGNOSIS — R1900 Intra-abdominal and pelvic swelling, mass and lump, unspecified site: Secondary | ICD-10-CM | POA: Insufficient documentation

## 2018-05-03 DIAGNOSIS — Z882 Allergy status to sulfonamides status: Secondary | ICD-10-CM | POA: Diagnosis not present

## 2018-05-03 DIAGNOSIS — Z888 Allergy status to other drugs, medicaments and biological substances status: Secondary | ICD-10-CM | POA: Diagnosis not present

## 2018-05-03 DIAGNOSIS — D631 Anemia in chronic kidney disease: Secondary | ICD-10-CM | POA: Diagnosis not present

## 2018-05-03 DIAGNOSIS — I129 Hypertensive chronic kidney disease with stage 1 through stage 4 chronic kidney disease, or unspecified chronic kidney disease: Secondary | ICD-10-CM | POA: Insufficient documentation

## 2018-05-03 DIAGNOSIS — D61818 Other pancytopenia: Secondary | ICD-10-CM | POA: Insufficient documentation

## 2018-05-03 DIAGNOSIS — E1042 Type 1 diabetes mellitus with diabetic polyneuropathy: Secondary | ICD-10-CM

## 2018-05-03 DIAGNOSIS — E1040 Type 1 diabetes mellitus with diabetic neuropathy, unspecified: Secondary | ICD-10-CM | POA: Insufficient documentation

## 2018-05-03 DIAGNOSIS — R222 Localized swelling, mass and lump, trunk: Secondary | ICD-10-CM | POA: Diagnosis not present

## 2018-05-03 DIAGNOSIS — K219 Gastro-esophageal reflux disease without esophagitis: Secondary | ICD-10-CM | POA: Diagnosis not present

## 2018-05-03 DIAGNOSIS — I10 Essential (primary) hypertension: Secondary | ICD-10-CM | POA: Diagnosis not present

## 2018-05-03 DIAGNOSIS — E039 Hypothyroidism, unspecified: Secondary | ICD-10-CM | POA: Diagnosis not present

## 2018-05-03 DIAGNOSIS — Z794 Long term (current) use of insulin: Secondary | ICD-10-CM | POA: Insufficient documentation

## 2018-05-03 DIAGNOSIS — E1022 Type 1 diabetes mellitus with diabetic chronic kidney disease: Secondary | ICD-10-CM | POA: Diagnosis not present

## 2018-05-03 DIAGNOSIS — Z87891 Personal history of nicotine dependence: Secondary | ICD-10-CM | POA: Diagnosis not present

## 2018-05-03 LAB — GLUCOSE, POCT (MANUAL RESULT ENTRY)
POC Glucose: 39 mg/dl — AB (ref 70–99)
POC Glucose: 65 mg/dl — AB (ref 70–99)
POC Glucose: 102 mg/dl — AB (ref 70–99)
POC Glucose: 78 mg/dl (ref 70–99)
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
## 2018-05-03 NOTE — Patient Instructions (Signed)
Please let me know if Dr. Ronnald Ramp refers you to a hematologist

## 2018-05-04 ENCOUNTER — Ambulatory Visit: Payer: Medicaid Other | Admitting: Endocrinology

## 2018-05-08 ENCOUNTER — Telehealth: Payer: Self-pay

## 2018-05-08 NOTE — Telephone Encounter (Signed)
Contacted [employer] and spoke to [employer]. intake representative    CPT- [id number] [place] chest soft tissue ICD- R19.00 Right flank mass     Case # [id number]   Gave all clinical information and they were needing additional clinical information     Asked to speak with the clinical reviewer and spoke with Tanya. Gave all clinical information and procedure was approved  Authorization # [id number]  Effective 05/08/2018 Expires 06/07/2018

## 2018-05-09 ENCOUNTER — Ambulatory Visit (HOSPITAL_COMMUNITY)
Admission: RE | Admit: 2018-05-09 | Discharge: 2018-05-09 | Disposition: A | Discharge status: Home / Self Care | Payer: Medicaid Other | Source: Ambulatory Visit | Attending: Internal Medicine | Admitting: Internal Medicine

## 2018-05-09 DIAGNOSIS — R1900 Intra-abdominal and pelvic swelling, mass and lump, unspecified site: Secondary | ICD-10-CM | POA: Diagnosis not present

## 2018-05-10 ENCOUNTER — Ambulatory Visit: Payer: Medicaid Other | Admitting: Dietician

## 2018-05-17 ENCOUNTER — Ambulatory Visit: Payer: Medicaid Other | Admitting: Podiatry

## 2018-05-17 ENCOUNTER — Ambulatory Visit (INDEPENDENT_AMBULATORY_CARE_PROVIDER_SITE_OTHER): Payer: Medicaid Other

## 2018-05-17 DIAGNOSIS — M21621 Bunionette of right foot: Secondary | ICD-10-CM | POA: Diagnosis not present

## 2018-05-17 DIAGNOSIS — M779 Enthesopathy, unspecified: Secondary | ICD-10-CM

## 2018-05-17 DIAGNOSIS — M722 Plantar fascial fibromatosis: Secondary | ICD-10-CM | POA: Diagnosis not present

## 2018-05-17 DIAGNOSIS — M79671 Pain in right foot: Secondary | ICD-10-CM

## 2018-05-17 DIAGNOSIS — M21622 Bunionette of left foot: Secondary | ICD-10-CM

## 2018-05-17 DIAGNOSIS — M79672 Pain in left foot: Principal | ICD-10-CM
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
## 2018-05-17 NOTE — Patient Instructions (Signed)

## 2018-05-21 ENCOUNTER — Ambulatory Visit: Payer: Medicaid Other | Attending: Internal Medicine | Admitting: Internal Medicine

## 2018-05-21 ENCOUNTER — Encounter: Payer: Self-pay | Admitting: Internal Medicine

## 2018-05-21 ENCOUNTER — Other Ambulatory Visit: Payer: Self-pay

## 2018-05-21 VITALS — BP 119/83 | HR 95 | Temp 98.5°F | Resp 16 | Wt 125.0 lb

## 2018-05-21 DIAGNOSIS — E039 Hypothyroidism, unspecified: Secondary | ICD-10-CM | POA: Diagnosis not present

## 2018-05-21 DIAGNOSIS — Z823 Family history of stroke: Secondary | ICD-10-CM | POA: Insufficient documentation

## 2018-05-21 DIAGNOSIS — E1029 Type 1 diabetes mellitus with other diabetic kidney complication: Secondary | ICD-10-CM

## 2018-05-21 DIAGNOSIS — N183 Chronic kidney disease, stage 3 (moderate): Secondary | ICD-10-CM | POA: Insufficient documentation

## 2018-05-21 DIAGNOSIS — Z8249 Family history of ischemic heart disease and other diseases of the circulatory system: Secondary | ICD-10-CM | POA: Diagnosis not present

## 2018-05-21 DIAGNOSIS — F419 Anxiety disorder, unspecified: Secondary | ICD-10-CM | POA: Insufficient documentation

## 2018-05-21 DIAGNOSIS — Z888 Allergy status to other drugs, medicaments and biological substances status: Secondary | ICD-10-CM | POA: Diagnosis not present

## 2018-05-21 DIAGNOSIS — I129 Hypertensive chronic kidney disease with stage 1 through stage 4 chronic kidney disease, or unspecified chronic kidney disease: Secondary | ICD-10-CM | POA: Diagnosis not present

## 2018-05-21 DIAGNOSIS — D61818 Other pancytopenia: Secondary | ICD-10-CM | POA: Diagnosis not present

## 2018-05-21 DIAGNOSIS — Z833 Family history of diabetes mellitus: Secondary | ICD-10-CM | POA: Insufficient documentation

## 2018-05-21 DIAGNOSIS — F329 Major depressive disorder, single episode, unspecified: Secondary | ICD-10-CM | POA: Diagnosis not present

## 2018-05-21 DIAGNOSIS — Z882 Allergy status to sulfonamides status: Secondary | ICD-10-CM | POA: Diagnosis not present

## 2018-05-21 DIAGNOSIS — K219 Gastro-esophageal reflux disease without esophagitis: Secondary | ICD-10-CM | POA: Insufficient documentation

## 2018-05-21 DIAGNOSIS — J069 Acute upper respiratory infection, unspecified: Secondary | ICD-10-CM | POA: Diagnosis not present

## 2018-05-21 DIAGNOSIS — Z803 Family history of malignant neoplasm of breast: Secondary | ICD-10-CM | POA: Insufficient documentation

## 2018-05-21 DIAGNOSIS — Z87891 Personal history of nicotine dependence: Secondary | ICD-10-CM | POA: Insufficient documentation

## 2018-05-21 DIAGNOSIS — E1022 Type 1 diabetes mellitus with diabetic chronic kidney disease: Secondary | ICD-10-CM | POA: Insufficient documentation

## 2018-05-21 DIAGNOSIS — Z79899 Other long term (current) drug therapy: Secondary | ICD-10-CM | POA: Diagnosis not present

## 2018-05-21 LAB — GLUCOSE, POCT (MANUAL RESULT ENTRY)
POC GLUCOSE: 374 mg/dL — AB (ref 70–99)
POC Glucose: 403 mg/dl — AB (ref 70–99)
POC Glucose: 324 mg/dl — AB (ref 70–99)

## 2018-05-21 LAB — POCT URINALYSIS DIPSTICK
GLUCOSE UA: POSITIVE — AB
LEUKOCYTES UA: NEGATIVE
Nitrite, UA: NEGATIVE
Protein, UA: POSITIVE — AB
SPEC GRAV UA: 1.025 (ref 1.010–1.025)
Urobilinogen, UA: 0.2 E.U./dL
pH, UA: 5.5 (ref 5.0–8.0)

## 2018-05-21 MED ORDER — SODIUM CHLORIDE 0.9 % IV SOLN
INTRAVENOUS | Status: DC
  Administered 2018-05-21: 16:00:00 via INTRAVENOUS

## 2018-05-21 MED ORDER — INSULIN ASPART 100 UNIT/ML ~~LOC~~ SOLN
10.0000 [IU] | Freq: Once | SUBCUTANEOUS | Status: AC
  Administered 2018-05-21: 10 [IU] via SUBCUTANEOUS

## 2018-05-21 MED ORDER — INSULIN ASPART 100 UNIT/ML ~~LOC~~ SOLN
5.0000 [IU] | Freq: Once | SUBCUTANEOUS | Status: AC
  Administered 2018-05-21: 5 [IU] via SUBCUTANEOUS

## 2018-05-21 NOTE — Progress Notes (Signed)
Patient ID: Angela [name], female    DOB: Jul 28, 1986  MRN: [id number]  CC: No chief complaint on file.   Subjective: Angela [name] is a 32 y.o. female who presents for urgent care visit Her concerns today include:   Patient complains of elevated blood sugars x4 days.  Meter has been registering as high.  Prior to this the highest blood sugar she has had was 250.  She has been having nausea and vomiting and diarrhea that started 4 days ago and also some upper respiratory symptoms including nasal congestion and cough.  No fever.  No vomiting or diarrhea today.  She has not eaten as yet for the day.  She has had some soreness in her chest.  She did take Levemir 50 units this morning and NovoLog 5 units.  Blood sugar on arrival here was 403 with trace ketones in the urine  She has not heard back from her nephrologist Dr. Ronnald Ramp as yet regarding the pancytopenia.  I was able to go into the system and see that she had hepatitis screen done that was negative.  I do not see HIV test done.  Patient Active Problem List   Diagnosis Date Noted  . Pancytopenia ([place]) 05/03/2018  . Right flank mass 05/03/2018  . Secondary hyperparathyroidism ([place]) 05/01/2018  . Anemia requiring transfusions 08/24/2017  . Anxiety and depression 06/23/2017  . Insomnia 01/12/2016  . Non compliance with medical treatment 01/05/2016  . Hyperlipidemia 01/05/2016  . Flank pain   . Chronic kidney disease (CKD) stage G3a/A3, moderately decreased glomerular filtration rate (GFR) between 45-59 mL/min/1.73 square meter and albuminuria creatinine ratio greater than 300 mg/g (HCC) 07/28/2015  . Neurogenic bladder 02/20/2015  . Diabetic peripheral neuropathy associated with type 1 diabetes mellitus ([place]) 02/20/2015  . Iron deficiency 02/13/2015  . Asthma 09/30/2014  . At risk for cardiovascular event 09/30/2014  . Diabetic retinopathy ([place]) 09/30/2014  . Hematuria, microscopic 09/30/2014  . Atypical squamous cells of  undetermined significance (ASCUS) on Papanicolaou smear of cervix 08/11/2014  . Diabetic gastroparesis associated with type 1 diabetes mellitus ([place]) 08/05/2014  . Anemia in chronic renal disease 08/05/2014  . HTN (hypertension) 07/11/2012  . GERD (gastroesophageal reflux disease) 09/26/2011  . Hypothyroidism 09/14/2006  . 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 albuterol (PROVENTIL HFA;VENTOLIN HFA) 108 (90 Base) MCG/ACT inhaler Inhale 2 puffs into the lungs every 6 (six) hours as needed for wheezing or shortness of breath. 1 Inhaler 1  . amLODipine (NORVASC) 10 MG tablet Take 1 tablet (10 mg total) by mouth daily. 90 tablet 3  . Cholecalciferol (VITAMIN D3) 1000 units CAPS Take by mouth.    . diltiazem (CARTIA XT) 240 MG 24 hr capsule Take 1 capsule (240 mg total) by mouth daily. 30 capsule 3  . ergocalciferol (DRISDOL) 50000 units capsule Take 1 capsule (50,000 Units total) by mouth once a week. 9 capsule 1  . escitalopram (LEXAPRO) 10 MG tablet Take 1 tablet (10 mg total) by mouth daily. 30 tablet 2  . furosemide (LASIX) 20 MG tablet Take 1 tablet (20 mg total) by mouth daily as needed for edema. (Patient not taking: Reported on 03/20/2018) 30 tablet 3  . gabapentin (NEURONTIN) 300 MG capsule Take 1 capsule (300 mg total) by mouth 2 (two) times daily. (Patient taking differently: Take 600 mg by mouth  at bedtime. ) 60 capsule 3  . insulin aspart (NOVOLOG) 100 UNIT/ML injection Inject 4 Units into the skin 3 (three) times daily with meals. Take only if blood sugar is over 300. 10 mL 3  . Insulin Detemir (LEVEMIR FLEXTOUCH) 100 UNIT/ML Pen Inject 50 Units into the skin every morning. And pen needles 1/day 30 mL 11  . Insulin Pen Needle 32G X 4 MM MISC Used to give insulin injections four times daily. 200 each 11  . Insulin Syringe-Needle  U-100 (BD INSULIN SYRINGE ULTRAFINE) 31G X 15/64" 1 ML MISC Used to give daily insulin injections. 100 each 11  . levothyroxine (SYNTHROID, LEVOTHROID) 200 MCG tablet Take 1 tablet (200 mcg total) by mouth daily before breakfast. 30 tablet 5  . metoCLOPramide (REGLAN) 5 MG tablet Take 1 tablet (5 mg total) by mouth 2 (two) times daily with a meal. 60 tablet 5  . OVER THE COUNTER MEDICATION Place 1 drop into both eyes daily as needed (dry eyes). Over the counter lubricating eye drops    . patiromer (VELTASSA) 8.4 g packet Take 8.4 g by mouth.    . pravastatin (PRAVACHOL) 20 MG tablet Take 20 mg by mouth daily.    . sodium bicarbonate 650 MG tablet Take 1 tablet (650 mg total) by mouth 2 (two) times daily. 60 tablet 0  . sodium polystyrene (KAYEXALATE) powder Take 30g for one dose, then take 15g three times weekly if unable to get patiromer.     No current facility-administered medications on file prior to visit.     Allergies  Allergen Reactions  . Ferumoxytol Itching  . Lisinopril Other (See Comments)    hyperkalemia  . Sulfamethoxazole Hives and Itching  . Trimethoprim Hives    Social History   Socioeconomic History  . Marital status: Divorced    Spouse name: Not on file  . Number of children: 0  . Years of education: 10th grade  . Highest education level: Not on file  Occupational History  . Occupation: unemployed    Comment: worked at a group  Social Needs  . Financial resource strain: Not on file  . Food insecurity:    Worry: Not on file    Inability: Not on file  . Transportation needs:    Medical: Not on file    Non-medical: Not on file  Tobacco Use  . Smoking status: Former Smoker    Packs/day: 0.25    Years: 2.00    Pack years: 0.50    Types: Cigarettes    Last attempt to quit: 03/04/2013    Years since quitting: 5.2  . Smokeless tobacco: Never Used  Substance and Sexual Activity  . Alcohol use: No    Alcohol/week: 0.0 oz  . Drug use: Yes    Types:  Marijuana    Comment: Last used: Yesterday   . Sexual activity: Never    Birth control/protection: None    Comment: women preference   Lifestyle  . Physical activity:    Days per week: Not on file    Minutes per session: Not on file  . Stress: Not on file  Relationships  . Social connections:    Talks on phone: Not on file    Gets together: Not on file    Attends religious service: Not on file    Active member of club or organization: Not on file    Attends meetings of clubs or organizations: Not on file    Relationship status: Not on file  .  Intimate partner violence:    Fear of current or ex partner: Not on file    Emotionally abused: Not on file    Physically abused: Not on file    Forced sexual activity: Not on file  Other Topics Concern  . Not on file  Social History Narrative   Occupation: currently unemployed   Single   Homosexual,     Lives with mom.    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            Family History  Problem Relation Age of Onset  . Multiple sclerosis Mother   . Hypothyroidism Mother   . Stroke Mother        at age 81 yo  . Hyperlipidemia Maternal Grandmother   . Hypertension Maternal Grandmother   . Heart disease Maternal Grandmother        unknown type  . Diabetes Maternal Grandmother   . Hypertension Maternal Grandfather   . Prostate cancer Maternal Grandfather   . Diabetes type I Maternal Grandfather   . Breast cancer Paternal Grandmother   . Cancer Neg Hx     Past Surgical History:  Procedure Laterality Date  . FOOT FUSION Right 2006   "put screws in it too" (09/19/2013)    ROS: Review of Systems Negative except as  above. PHYSICAL EXAM: BP 119/83   Pulse 95   Temp 98.5 F (36.9 C) (Oral)   Resp 16   Wt 125 lb (56.7 kg)   SpO2 96%   BMI 20.80 kg/m    Physical Exam  General appearance - alert, well appearing, and in no distress Mental status - normal mood, behavior, speech, dress, motor activity, and thought processes Mouth -slightly dry oral mucosa. Neck - supple, no significant adenopathy Chest - clear to auscultation, no wheezes, rales or rhonchi, symmetric air entry Heart - normal rate, regular rhythm, normal S1, S2, no murmurs, rubs, clicks or gallops Extremities - peripheral pulses normal, no pedal edema, no clubbing or cyanosis  EKG normal sinus rhythm with left atrial enlargement.  Mild LVH  ASSESSMENT AND PLAN: 1. 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  Patient given a liter of fluid today with 15 units of NovoLog.  Blood sugar at the time of discharge was 324.  Patient to resume her home Levemir and sliding scale insulin.  Keep her scheduled follow-up appointment with me next month - POCT urinalysis dipstick - POCT glucose (manual entry) - insulin aspart (novoLOG) injection 10 Units - insulin aspart (novoLOG) injection 5 Units - POCT glucose (manual entry) - Basic Metabolic Panel - 0.9 %  sodium chloride infusion - POCT glucose (manual entry)  2. Pancytopenia ([place]) - CBC with Differential/Platelet - HIV antibody  3. Upper respiratory tract infection, unspecified type Resolving.  Symptomatic treatment   Patient was given the opportunity to ask questions.  Patient verbalized understanding of the plan and was able to repeat key elements of the plan.   Orders Placed This Encounter  Procedures  .  CBC with Differential/Platelet  . Basic Metabolic Panel  . HIV antibody  . POCT urinalysis dipstick  . POCT glucose (manual  entry)  . POCT glucose (manual entry)  . POCT glucose (manual entry)     Requested Prescriptions    No prescriptions requested or ordered in this encounter    No follow-ups on file.  Karle Plumber, MD, FACP

## 2018-05-22 LAB — CBC WITH DIFFERENTIAL/PLATELET
BASOS ABS: 0 10*3/uL (ref 0.0–0.2)
Basos: 0 %
EOS (ABSOLUTE): 0 10*3/uL (ref 0.0–0.4)
EOS: 0 %
HEMATOCRIT: 34 % (ref 34.0–46.6)
Hemoglobin: 10.6 g/dL — ABNORMAL LOW (ref 11.1–15.9)
IMMATURE GRANULOCYTES: 0 %
Immature Grans (Abs): 0 10*3/uL (ref 0.0–0.1)
Lymphocytes Absolute: 2.3 10*3/uL (ref 0.7–3.1)
Lymphs: 31 %
MCH: 27.3 pg (ref 26.6–33.0)
MCHC: 31.2 g/dL — ABNORMAL LOW (ref 31.5–35.7)
MCV: 88 fL (ref 79–97)
MONOS ABS: 0.3 10*3/uL (ref 0.1–0.9)
Monocytes: 4 %
NEUTROS PCT: 65 %
Neutrophils Absolute: 4.6 10*3/uL (ref 1.4–7.0)
PLATELETS: 177 10*3/uL (ref 150–450)
RBC: 3.88 x10E6/uL (ref 3.77–5.28)
RDW: 12.2 % — AB (ref 12.3–15.4)
WBC: 7.2 10*3/uL (ref 3.4–10.8)

## 2018-05-22 LAB — BASIC METABOLIC PANEL
BUN/Creatinine Ratio: 12 (ref 9–23)
BUN: 34 mg/dL — ABNORMAL HIGH (ref 6–20)
CALCIUM: 9.3 mg/dL (ref 8.7–10.2)
CHLORIDE: 98 mmol/L (ref 96–106)
CO2: 20 mmol/L (ref 20–29)
Creatinine, Ser: 2.85 mg/dL — ABNORMAL HIGH (ref 0.57–1.00)
GFR calc Af Amer: 24 mL/min/{1.73_m2} — ABNORMAL LOW (ref >59)
GFR, EST NON AFRICAN AMERICAN: 21 mL/min/{1.73_m2} — AB (ref >59)
GLUCOSE: 335 mg/dL — AB (ref 65–99)
POTASSIUM: 4 mmol/L (ref 3.5–5.2)
Sodium: 135 mmol/L (ref 134–144)

## 2018-05-22 LAB — HIV ANTIBODY (ROUTINE TESTING W REFLEX): HIV SCREEN 4TH GENERATION: NONREACTIVE

## 2018-05-23 ENCOUNTER — Telehealth: Payer: Self-pay

## 2018-05-23 ENCOUNTER — Ambulatory Visit: Payer: Medicaid Other

## 2018-05-23 NOTE — Telephone Encounter (Signed)
Contacted pt to go over lab results pt is aware and doesn't have any questions or concerns 

## 2018-05-30 MED FILL — LEVOTHYROXINE 200 MCG TAB: 200 | 30 days supply | Qty: 30 | Fill #3

## 2018-06-01 ENCOUNTER — Telehealth: Payer: Self-pay | Admitting: Internal Medicine

## 2018-06-01 NOTE — Telephone Encounter (Signed)
[place] call pt

## 2018-06-01 NOTE — Telephone Encounter (Signed)
Patient came in and stated that when she drinks something cold or the air is on it cause her to cough moving and laughing also causes the cough until the point of vomiting. Please call patient back at [phone]

## 2018-06-05 ENCOUNTER — Ambulatory Visit: Payer: Medicaid Other | Admitting: Internal Medicine

## 2018-06-15 ENCOUNTER — Ambulatory Visit: Payer: Medicaid Other | Admitting: Internal Medicine

## 2018-06-25 ENCOUNTER — Ambulatory Visit: Payer: Medicaid Other | Attending: Internal Medicine | Admitting: Internal Medicine

## 2018-06-25 ENCOUNTER — Encounter: Payer: Self-pay | Admitting: Internal Medicine

## 2018-06-25 VITALS — BP 115/75 | HR 86 | Temp 98.5°F | Resp 18 | Ht 68.0 in | Wt 133.0 lb

## 2018-06-25 DIAGNOSIS — N183 Chronic kidney disease, stage 3 (moderate): Secondary | ICD-10-CM | POA: Diagnosis not present

## 2018-06-25 DIAGNOSIS — E1065 Type 1 diabetes mellitus with hyperglycemia: Secondary | ICD-10-CM | POA: Diagnosis not present

## 2018-06-25 DIAGNOSIS — E1143 Type 2 diabetes mellitus with diabetic autonomic (poly)neuropathy: Secondary | ICD-10-CM | POA: Diagnosis not present

## 2018-06-25 DIAGNOSIS — E1022 Type 1 diabetes mellitus with diabetic chronic kidney disease: Secondary | ICD-10-CM | POA: Diagnosis not present

## 2018-06-25 DIAGNOSIS — R109 Unspecified abdominal pain: Secondary | ICD-10-CM | POA: Diagnosis not present

## 2018-06-25 DIAGNOSIS — F419 Anxiety disorder, unspecified: Secondary | ICD-10-CM | POA: Insufficient documentation

## 2018-06-25 DIAGNOSIS — D631 Anemia in chronic kidney disease: Secondary | ICD-10-CM | POA: Diagnosis not present

## 2018-06-25 DIAGNOSIS — Z882 Allergy status to sulfonamides status: Secondary | ICD-10-CM | POA: Diagnosis not present

## 2018-06-25 DIAGNOSIS — J40 Bronchitis, not specified as acute or chronic: Secondary | ICD-10-CM

## 2018-06-25 DIAGNOSIS — Z87891 Personal history of nicotine dependence: Secondary | ICD-10-CM | POA: Insufficient documentation

## 2018-06-25 DIAGNOSIS — I10 Essential (primary) hypertension: Secondary | ICD-10-CM

## 2018-06-25 DIAGNOSIS — K219 Gastro-esophageal reflux disease without esophagitis: Secondary | ICD-10-CM | POA: Diagnosis not present

## 2018-06-25 DIAGNOSIS — J45909 Unspecified asthma, uncomplicated: Secondary | ICD-10-CM | POA: Insufficient documentation

## 2018-06-25 DIAGNOSIS — N2581 Secondary hyperparathyroidism of renal origin: Secondary | ICD-10-CM | POA: Insufficient documentation

## 2018-06-25 DIAGNOSIS — Z888 Allergy status to other drugs, medicaments and biological substances status: Secondary | ICD-10-CM | POA: Diagnosis not present

## 2018-06-25 DIAGNOSIS — E1043 Type 1 diabetes mellitus with diabetic autonomic (poly)neuropathy: Secondary | ICD-10-CM | POA: Insufficient documentation

## 2018-06-25 DIAGNOSIS — E1029 Type 1 diabetes mellitus with other diabetic kidney complication: Secondary | ICD-10-CM | POA: Diagnosis not present

## 2018-06-25 DIAGNOSIS — Z794 Long term (current) use of insulin: Secondary | ICD-10-CM | POA: Insufficient documentation

## 2018-06-25 DIAGNOSIS — Z881 Allergy status to other antibiotic agents status: Secondary | ICD-10-CM | POA: Diagnosis not present

## 2018-06-25 DIAGNOSIS — E108 Type 1 diabetes mellitus with unspecified complications: Secondary | ICD-10-CM | POA: Diagnosis not present

## 2018-06-25 DIAGNOSIS — Z79890 Hormone replacement therapy (postmenopausal): Secondary | ICD-10-CM | POA: Insufficient documentation

## 2018-06-25 DIAGNOSIS — E039 Hypothyroidism, unspecified: Secondary | ICD-10-CM | POA: Insufficient documentation

## 2018-06-25 DIAGNOSIS — E785 Hyperlipidemia, unspecified: Secondary | ICD-10-CM | POA: Diagnosis not present

## 2018-06-25 DIAGNOSIS — K3184 Gastroparesis: Secondary | ICD-10-CM | POA: Insufficient documentation

## 2018-06-25 DIAGNOSIS — F329 Major depressive disorder, single episode, unspecified: Secondary | ICD-10-CM | POA: Insufficient documentation

## 2018-06-25 DIAGNOSIS — I129 Hypertensive chronic kidney disease with stage 1 through stage 4 chronic kidney disease, or unspecified chronic kidney disease: Secondary | ICD-10-CM | POA: Insufficient documentation

## 2018-06-25 DIAGNOSIS — IMO0002 Reserved for concepts with insufficient information to code with codable children: Secondary | ICD-10-CM

## 2018-06-25 DIAGNOSIS — Z79899 Other long term (current) drug therapy: Secondary | ICD-10-CM | POA: Diagnosis not present

## 2018-06-25 LAB — POCT URINALYSIS DIPSTICK
BILIRUBIN UA: NEGATIVE
GLUCOSE UA: POSITIVE — AB
KETONES UA: NEGATIVE
Leukocytes, UA: NEGATIVE
Nitrite, UA: NEGATIVE
Protein, UA: POSITIVE — AB
Spec Grav, UA: 1.015 (ref 1.010–1.025)
Urobilinogen, UA: 0.2 E.U./dL
pH, UA: 6.5 (ref 5.0–8.0)

## 2018-06-25 LAB — GLUCOSE, POCT (MANUAL RESULT ENTRY)
POC Glucose: 519 mg/dl — AB (ref 70–99)
POC Glucose: 474 mg/dl — AB (ref 70–99)

## 2018-06-25 MED ORDER — INSULIN ASPART 100 UNIT/ML ~~LOC~~ SOLN: 10.0000 [IU] | Freq: Once | SUBCUTANEOUS | Status: DC

## 2018-06-25 MED ORDER — ALBUTEROL SULFATE HFA 108 (90 BASE) MCG/ACT IN AERS: 2.0000 | INHALATION_SPRAY | Freq: Four times a day (QID) | RESPIRATORY_TRACT | 2 refills | Status: DC | PRN

## 2018-06-25 MED ORDER — METOCLOPRAMIDE HCL 5 MG PO TABS: 5.0000 mg | ORAL_TABLET | Freq: Three times a day (TID) | ORAL | 5 refills | Status: DC

## 2018-06-25 MED ORDER — INSULIN ASPART 100 UNIT/ML ~~LOC~~ SOLN
10.0000 [IU] | Freq: Once | SUBCUTANEOUS | Status: AC
  Administered 2018-06-25: 10 [IU] via SUBCUTANEOUS

## 2018-06-25 NOTE — Progress Notes (Signed)
Patient ID: Angela [name], female    DOB: [birth year]  MRN: [id number]  CC: Follow-up and Diabetes   Subjective: Angela [name] is a 32 y.o. female who presents for chronic disease management. Her concerns today include:  Pt with hx of DM type 1 with neuropathy, gastroparesis and nephropathy, HTN, CKD stage 3-4, ACD/IDA, hypothyroid, HL  DM type 1:  BS high for past 2 wks Taking Levemir 50 units in a.m but has not been taking the Novolog consistently even with BS being high.  She states that her blood sugar drops low about 2 hours after she gives herself the 4 units of NovoLog for blood sugars over 300.  Reports poor appetite.  Mainly liquids or soft foods.  Solids make her sick to her stomach.  She is on Reglan twice a day She ate some Fruitloops and 2 candies today before coming to this appt No frequent thirst or urination.  Last eye exam was 1 yr ago at [facility] Would like to see a different endocrinologist.  States that she feels too rushed when she sees Dr. Loanne Drilling  Cough when exposed to cold air and and when she drinks cold water.  This is been going on for several weeks.  HTN/CKD:  Compliant with meds and salt restriction He has a follow-up appointment with her endocrinologist coming up in the next 2 weeks  Still complains of intermittent right flank pain.  We had done an ultrasound of this area a few months ago looking for any soft tissue mass and it was negative.  We are unable to do CAT scan of the abdomen with contrast due to poor kidney function  Patient Active Problem List   Diagnosis Date Noted  . Pancytopenia ([place]) 05/03/2018  . Right flank mass 05/03/2018  . Secondary hyperparathyroidism ([place]) 05/01/2018  . Anemia requiring transfusions 08/24/2017  . Anxiety and depression 06/23/2017  . Insomnia 01/12/2016  . Non compliance with medical treatment 01/05/2016  . Hyperlipidemia 01/05/2016  . Flank pain   . Chronic kidney disease (CKD) stage G3a/A3, moderately  decreased glomerular filtration rate (GFR) between 45-59 mL/min/1.73 square meter and albuminuria creatinine ratio greater than 300 mg/g (HCC) 07/28/2015  . Neurogenic bladder 02/20/2015  . Diabetic peripheral neuropathy associated with type 1 diabetes mellitus ([place]) 02/20/2015  . Iron deficiency 02/13/2015  . Asthma 09/30/2014  . At risk for cardiovascular event 09/30/2014  . Diabetic retinopathy ([place]) 09/30/2014  . Hematuria, microscopic 09/30/2014  . Atypical squamous cells of undetermined significance (ASCUS) on Papanicolaou smear of cervix 08/11/2014  . Diabetic gastroparesis associated with type 1 diabetes mellitus ([place]) 08/05/2014  . Anemia in chronic renal disease 08/05/2014  . HTN (hypertension) 07/11/2012  . GERD (gastroesophageal reflux disease) 09/26/2011  . Hypothyroidism 09/14/2006  . 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 albuterol (PROVENTIL HFA;VENTOLIN HFA) 108 (90 Base) MCG/ACT inhaler Inhale 2 puffs into the lungs every 6 (six) hours as needed for wheezing or shortness of breath. 1 Inhaler 1  . amLODipine (NORVASC) 10 MG tablet Take 1 tablet (10 mg total) by mouth daily. 90 tablet 3  . Cholecalciferol (VITAMIN D3) 1000 units CAPS Take by mouth.    . diltiazem (CARTIA XT) 240 MG 24 hr capsule Take 1 capsule (240 mg total) by mouth daily. 30 capsule 3  . ergocalciferol (DRISDOL) 50000 units  capsule Take 1 capsule (50,000 Units total) by mouth once a week. 9 capsule 1  . escitalopram (LEXAPRO) 10 MG tablet Take 1 tablet (10 mg total) by mouth daily. 30 tablet 2  . furosemide (LASIX) 20 MG tablet Take 1 tablet (20 mg total) by mouth daily as needed for edema. 30 tablet 3  . gabapentin (NEURONTIN) 300 MG capsule Take 1 capsule (300 mg total) by mouth 2 (two) times daily. (Patient taking differently: Take 600 mg by  mouth at bedtime. ) 60 capsule 3  . insulin aspart (NOVOLOG) 100 UNIT/ML injection Inject 4 Units into the skin 3 (three) times daily with meals. Take only if blood sugar is over 300. 10 mL 3  . Insulin Detemir (LEVEMIR FLEXTOUCH) 100 UNIT/ML Pen Inject 50 Units into the skin every morning. And pen needles 1/day 30 mL 11  . Insulin Pen Needle 32G X 4 MM MISC Used to give insulin injections four times daily. 200 each 11  . Insulin Syringe-Needle U-100 (BD INSULIN SYRINGE ULTRAFINE) 31G X 15/64" 1 ML MISC Used to give daily insulin injections. 100 each 11  . levothyroxine (SYNTHROID, LEVOTHROID) 200 MCG tablet Take 1 tablet (200 mcg total) by mouth daily before breakfast. 30 tablet 5  . metoCLOPramide (REGLAN) 5 MG tablet Take 1 tablet (5 mg total) by mouth 2 (two) times daily with a meal. 60 tablet 5  . OVER THE COUNTER MEDICATION Place 1 drop into both eyes daily as needed (dry eyes). Over the counter lubricating eye drops    . patiromer (VELTASSA) 8.4 g packet Take 8.4 g by mouth.    . sodium bicarbonate 650 MG tablet Take 1 tablet (650 mg total) by mouth 2 (two) times daily. 60 tablet 0  . sodium polystyrene (KAYEXALATE) powder Take 30g for one dose, then take 15g three times weekly if unable to get patiromer.    . pravastatin (PRAVACHOL) 20 MG tablet Take 20 mg by mouth daily.     No current facility-administered medications on file prior to visit.     Allergies  Allergen Reactions  . Ferumoxytol Itching  . Lisinopril Other (See Comments)    hyperkalemia  . Sulfamethoxazole Hives and Itching  . Trimethoprim Hives    Social History   Socioeconomic History  . Marital status: Divorced    Spouse name: Not on file  . Number of children: 0  . Years of education: 10th grade  . Highest education level: Not on file  Occupational History  . Occupation: unemployed    Comment: worked at a group  Social Needs  . Financial resource strain: Not on file  . Food insecurity:    Worry: Not on  file    Inability: Not on file  . Transportation needs:    Medical: Not on file    Non-medical: Not on file  Tobacco Use  . Smoking status: Former Smoker    Packs/day: 0.25    Years: 2.00    Pack years: 0.50    Types: Cigarettes    Last attempt to quit: 03/04/2013    Years since quitting: 5.3  . Smokeless tobacco: Never Used  Substance and Sexual Activity  . Alcohol use: No    Alcohol/week: 0.0 oz  . Drug use: Yes    Types: Marijuana    Comment: Last used: Yesterday   . Sexual activity: Yes    Birth control/protection: None    Comment: women preference   Lifestyle  . Physical activity:    Days  per week: Not on file    Minutes per session: Not on file  . Stress: Not on file  Relationships  . Social connections:    Talks on phone: Not on file    Gets together: Not on file    Attends religious service: Not on file    Active member of club or organization: Not on file    Attends meetings of clubs or organizations: Not on file    Relationship status: Not on file  . Intimate partner violence:    Fear of current or ex partner: Not on file    Emotionally abused: Not on file    Physically abused: Not on file    Forced sexual activity: Not on file  Other Topics Concern  . Not on file  Social History Narrative   Occupation: currently unemployed   Single   Homosexual,     Lives with mom.    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            Family History  Problem Relation Age of Onset  . Multiple sclerosis Mother   . Hypothyroidism Mother   . Stroke Mother        at age 33 yo  . Hyperlipidemia Maternal Grandmother   . Hypertension Maternal Grandmother   .  Heart disease Maternal Grandmother        unknown type  . Diabetes Maternal Grandmother   . Hypertension Maternal Grandfather   . Prostate cancer Maternal Grandfather   . Diabetes type I Maternal Grandfather   . Breast cancer Paternal Grandmother   . Cancer Neg Hx     Past Surgical History:  Procedure Laterality Date  . FOOT FUSION Right 2006   "put screws in it too" (09/19/2013)    ROS: Review of Systems Negative except as stated above PHYSICAL EXAM: BP 115/75 (BP Location: Left Arm, Patient Position: Sitting, Cuff Size: Normal)   Pulse 86   Temp 98.5 F (36.9 C) (Oral)   Resp 18   Ht 5\' 8"  (1.727 m)   Wt 133 lb (60.3 kg)   LMP 06/25/2018   SpO2 98%   BMI 20.22 kg/m   Wt Readings from Last 3 Encounters:  06/25/18 133 lb (60.3 kg)  05/21/18 125 lb (56.7 kg)  05/03/18 134 lb 6.4 oz (61 kg)    Physical Exam  General appearance - alert, well appearing, and in no distress Mental status - normal mood, behavior, speech, dress, motor activity, and thought processes Chest - clear to auscultation, no wheezes, rales or rhonchi, symmetric air entry Heart - normal rate, regular rhythm, normal S1, S2, no murmurs, rubs, clicks or gallops Extremities - peripheral pulses normal, no pedal edema, no clubbing or cyanosis MSK:  RT flank -no mass felt at this time.  Results for orders placed or performed in visit on 06/25/18  POCT glucose (manual entry)  Result Value Ref Range   POC Glucose 519 (A) 70 - 99 mg/dl  Urinalysis Dipstick  Result Value Ref Range   Color, UA LIGHT YELLOW    Clarity, UA CLEAR    Glucose, UA  Positive (A) Negative   Bilirubin, UA NEG    Ketones, UA NEG    Spec Grav, UA 1.015 1.010 - 1.025   Blood, UA SMALL    pH, UA 6.5 5.0 - 8.0   Protein, UA Positive (A) Negative   Urobilinogen, UA 0.2 0.2 or 1.0 E.U./dL   Nitrite, UA NEG    Leukocytes, UA Negative Negative   Appearance     Odor    Glucose (CBG)  Result Value Ref Range   POC Glucose 474 (A) 70 -  99 mg/dl   Lab Results  Component Value Date   HGBA1C 8.9 04/19/2018    ASSESSMENT AND PLAN: 1. Diabetes mellitus type 1, uncontrolled, with complications [facility]) Patient given 10 units of NovoLog today here in the clinic.  Given 2 bottles of water to drink. -Advised patient to use the NovoLog.  If she finds that blood sugars are dropping too low with 4 units, then she needs to give herself at least 2 units and then recheck the blood sugar in 1 hour.  If the blood sugar is still greater than 300 then she needs to give herself the additional 2 units.  Continue Levemir. Healthy eating habits encouraged.  Recommend snacking on other things, candy We will try to get her in with a different endocrinologist and she does not want to go back to see Dr. Loanne Drilling - POCT glucose (manual entry) - Microalbumin / creatinine urine ratio -- Urinalysis Dipstick - insulin aspart (novoLOG) injection 10 Units - Ambulatory referral to Endocrinology - Glucose (CBG)  2. Diabetes mellitus with gastroparesis ([place]) Stressed the importance of diabetes control. Increase Reglan to 5 mg 3 times daily.  3. Reactive airway disease without complication, unspecified asthma severity, unspecified whether persistent Albuterol inhaler to use as needed.  Recommend drinking fluids that are at room temperature  4. Essential hypertension Controlled.  Continue current medications  5. Flank pain No mass felt.  We will continue to observe    Patient was given the opportunity to ask questions.  Patient verbalized understanding of the plan and was able to repeat key elements of the plan.   Orders Placed This Encounter  Procedures  . Microalbumin / creatinine urine ratio  . Ambulatory referral to Endocrinology  . POCT glucose (manual entry)  . Urinalysis Dipstick  . Glucose (CBG)     Requested Prescriptions    No prescriptions requested or ordered in this encounter    Return in about 2 months (around  08/26/2018).  Karle Plumber, MD, FACP

## 2018-06-26 LAB — MICROALBUMIN / CREATININE URINE RATIO
CREATININE, UR: 46.1 mg/dL
MICROALBUM., U, RANDOM: 669 ug/mL
Microalb/Creat Ratio: 1451.2 mg/g creat — ABNORMAL HIGH (ref 0.0–30.0)

## 2018-06-28 ENCOUNTER — Ambulatory Visit: Payer: Medicaid Other | Admitting: Podiatry

## 2018-07-04 ENCOUNTER — Other Ambulatory Visit: Payer: Self-pay | Admitting: Podiatry

## 2018-07-04 DIAGNOSIS — M722 Plantar fascial fibromatosis: Secondary | ICD-10-CM

## 2018-07-06 ENCOUNTER — Ambulatory Visit: Payer: Medicaid Other | Admitting: Podiatry

## 2018-07-06 ENCOUNTER — Encounter: Payer: Self-pay | Admitting: Podiatry

## 2018-07-06 DIAGNOSIS — M722 Plantar fascial fibromatosis: Secondary | ICD-10-CM | POA: Diagnosis not present

## 2018-07-06 DIAGNOSIS — M779 Enthesopathy, unspecified: Secondary | ICD-10-CM

## 2018-07-06 DIAGNOSIS — M79671 Pain in right foot: Secondary | ICD-10-CM
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
## 2018-07-17 DIAGNOSIS — E1022 Type 1 diabetes mellitus with diabetic chronic kidney disease: Secondary | ICD-10-CM | POA: Diagnosis not present

## 2018-07-17 DIAGNOSIS — N184 Chronic kidney disease, stage 4 (severe): Secondary | ICD-10-CM | POA: Diagnosis not present

## 2018-07-17 DIAGNOSIS — E039 Hypothyroidism, unspecified: Secondary | ICD-10-CM | POA: Diagnosis not present

## 2018-07-19 ENCOUNTER — Ambulatory Visit: Payer: Medicaid Other | Attending: Internal Medicine | Admitting: Physician Assistant

## 2018-07-19 ENCOUNTER — Other Ambulatory Visit (HOSPITAL_COMMUNITY)
Admission: RE | Admit: 2018-07-19 | Discharge: 2018-07-19 | Disposition: A | Discharge status: Home / Self Care | Payer: Medicaid Other | Source: Ambulatory Visit | Attending: Internal Medicine | Admitting: Internal Medicine

## 2018-07-19 VITALS — BP 108/69 | HR 98 | Temp 98.2°F | Resp 18

## 2018-07-19 DIAGNOSIS — Z882 Allergy status to sulfonamides status: Secondary | ICD-10-CM | POA: Diagnosis not present

## 2018-07-19 DIAGNOSIS — N183 Chronic kidney disease, stage 3 (moderate): Secondary | ICD-10-CM | POA: Diagnosis not present

## 2018-07-19 DIAGNOSIS — Z888 Allergy status to other drugs, medicaments and biological substances status: Secondary | ICD-10-CM | POA: Insufficient documentation

## 2018-07-19 DIAGNOSIS — Z113 Encounter for screening for infections with a predominantly sexual mode of transmission: Secondary | ICD-10-CM | POA: Insufficient documentation

## 2018-07-19 DIAGNOSIS — F329 Major depressive disorder, single episode, unspecified: Secondary | ICD-10-CM | POA: Diagnosis not present

## 2018-07-19 DIAGNOSIS — Z79899 Other long term (current) drug therapy: Secondary | ICD-10-CM | POA: Insufficient documentation

## 2018-07-19 DIAGNOSIS — Z794 Long term (current) use of insulin: Secondary | ICD-10-CM | POA: Insufficient documentation

## 2018-07-19 DIAGNOSIS — E1042 Type 1 diabetes mellitus with diabetic polyneuropathy: Secondary | ICD-10-CM | POA: Diagnosis not present

## 2018-07-19 DIAGNOSIS — E039 Hypothyroidism, unspecified: Secondary | ICD-10-CM | POA: Insufficient documentation

## 2018-07-19 DIAGNOSIS — Z881 Allergy status to other antibiotic agents status: Secondary | ICD-10-CM | POA: Diagnosis not present

## 2018-07-19 DIAGNOSIS — E1029 Type 1 diabetes mellitus with other diabetic kidney complication: Secondary | ICD-10-CM

## 2018-07-19 DIAGNOSIS — G4700 Insomnia, unspecified: Secondary | ICD-10-CM | POA: Diagnosis not present

## 2018-07-19 DIAGNOSIS — E1022 Type 1 diabetes mellitus with diabetic chronic kidney disease: Secondary | ICD-10-CM | POA: Diagnosis not present

## 2018-07-19 DIAGNOSIS — I129 Hypertensive chronic kidney disease with stage 1 through stage 4 chronic kidney disease, or unspecified chronic kidney disease: Secondary | ICD-10-CM | POA: Insufficient documentation

## 2018-07-19 DIAGNOSIS — I10 Essential (primary) hypertension: Secondary | ICD-10-CM | POA: Diagnosis not present

## 2018-07-19 DIAGNOSIS — E785 Hyperlipidemia, unspecified: Secondary | ICD-10-CM | POA: Insufficient documentation

## 2018-07-19 DIAGNOSIS — Z79890 Hormone replacement therapy (postmenopausal): Secondary | ICD-10-CM | POA: Insufficient documentation

## 2018-07-19 DIAGNOSIS — K219 Gastro-esophageal reflux disease without esophagitis: Secondary | ICD-10-CM | POA: Insufficient documentation

## 2018-07-19 LAB — POCT URINALYSIS DIPSTICK
BILIRUBIN UA: NEGATIVE
Glucose, UA: POSITIVE — AB
Leukocytes, UA: NEGATIVE
NITRITE UA: NEGATIVE
PH UA: 5 (ref 5.0–8.0)
PROTEIN UA: POSITIVE — AB
Spec Grav, UA: 1.025 (ref 1.010–1.025)
UROBILINOGEN UA: 0.2 U/dL

## 2018-07-19 LAB — GLUCOSE, POCT (MANUAL RESULT ENTRY)
POC Glucose: 381 mg/dl — AB (ref 70–99)
POC Glucose: 314 mg/dl — AB (ref 70–99)

## 2018-07-19 MED ORDER — INSULIN ASPART 100 UNIT/ML ~~LOC~~ SOLN
10.0000 [IU] | Freq: Once | SUBCUTANEOUS | Status: AC
  Administered 2018-07-19: 10 [IU] via SUBCUTANEOUS

## 2018-07-19 MED ORDER — INSULIN ASPART 100 UNIT/ML ~~LOC~~ SOLN: 25.0000 [IU] | Freq: Once | SUBCUTANEOUS | Status: DC

## 2018-07-19 MED FILL — PROAIR HFA 90 MCG INHALER: 108 (90 BAS | 25 days supply | Qty: 9 | Fill #0

## 2018-07-19 NOTE — Patient Instructions (Signed)
Check blood sugars 3 times daily and record and bring to your next visit.  Make sure and take all.
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
## 2018-07-20 LAB — HSV(HERPES SIMPLEX VRS) I + II AB-IGG
HSV 1 Glycoprotein G Ab, IgG: <0.91 index (ref 0.00–0.90)
HSV 2 IgG, Type Spec: <0.91 index (ref 0.00–0.90)

## 2018-07-20 LAB — RPR: RPR: NONREACTIVE

## 2018-07-20 LAB — URINE CYTOLOGY ANCILLARY ONLY
Chlamydia: NEGATIVE
Neisseria Gonorrhea: NEGATIVE
TRICH (WINDOWPATH): NEGATIVE

## 2018-07-23 ENCOUNTER — Telehealth: Payer: Self-pay | Admitting: *Deleted

## 2018-07-23 ENCOUNTER — Encounter: Payer: Self-pay | Admitting: Internal Medicine

## 2018-07-23 ENCOUNTER — Ambulatory Visit: Payer: Medicaid Other | Admitting: Internal Medicine

## 2018-07-23 ENCOUNTER — Other Ambulatory Visit: Payer: Self-pay | Admitting: Physician Assistant

## 2018-07-23 ENCOUNTER — Telehealth: Payer: Self-pay | Admitting: Internal Medicine

## 2018-07-23 LAB — URINE CYTOLOGY ANCILLARY ONLY: Candida vaginitis: NEGATIVE

## 2018-07-23 MED ORDER — METRONIDAZOLE 500 MG PO TABS: 500.0000 mg | ORAL_TABLET | Freq: Two times a day (BID) | ORAL | 0 refills | Status: DC

## 2018-07-23 MED FILL — metroNIDAZOLE 500 MG TABS: 500 | 7 days supply | Qty: 14 | Fill #0

## 2018-07-23 NOTE — Telephone Encounter (Signed)
Contacted pt to get further information for provider. Pt states she see's her counselor and her counselor asks her to urinate and pt tries to explain to her counselor that she can not urinate because she has kidney disease and once she wakes up and urinate she can not go.  Pt states once she wakes up in the morning and urinates she doesn't have to urinate again till later that day. Pt states it is hard for her to urinate.

## 2018-07-23 NOTE — Telephone Encounter (Signed)
Patient verified DOB Patient is aware of all STI's being negative and needing to pick up flagyl from [facility] pharmacy and take two tablets for one week. Patient advised to refrain from intercourse until she has completed 1 week script. No further questions at this time.

## 2018-07-23 NOTE — Telephone Encounter (Signed)
I called and informed the patient that the letter could not be written.

## 2018-07-23 NOTE — Telephone Encounter (Signed)
Patient called asking for a letter stating that with her kidney function being low she doesn't urina that much and that its hard for to pee after the first morning urin. Patient would like to pick letter up for social worker on the 8th.

## 2018-07-23 NOTE — Telephone Encounter (Signed)
Please call patient and let her know

## 2018-07-23 NOTE — Telephone Encounter (Signed)
-----   Message from Argentina Donovan, [place] sent at 07/23/2018  4:34 PM EDT ----- Your were negative for HIV, syphilis, gonorhhea, herpes, chlamydia, and trichomonas which are all sexually transmitted diseases.  Your tested positive for bacterial overgrowth in the vagina which is not an STD, but I did send you a prescription to our pharmacy for it.  Thanks, Freeman Caldron, PA-C

## 2018-07-31 DIAGNOSIS — N2581 Secondary hyperparathyroidism of renal origin: Secondary | ICD-10-CM | POA: Diagnosis not present

## 2018-07-31 DIAGNOSIS — D631 Anemia in chronic kidney disease: Secondary | ICD-10-CM | POA: Diagnosis not present

## 2018-07-31 DIAGNOSIS — R7989 Other specified abnormal findings of blood chemistry: Secondary | ICD-10-CM | POA: Diagnosis not present

## 2018-07-31 DIAGNOSIS — I120 Hypertensive chronic kidney disease with stage 5 chronic kidney disease or end stage renal disease: Secondary | ICD-10-CM | POA: Diagnosis not present

## 2018-07-31 DIAGNOSIS — Z888 Allergy status to other drugs, medicaments and biological substances status: Secondary | ICD-10-CM | POA: Diagnosis not present

## 2018-07-31 DIAGNOSIS — R809 Proteinuria, unspecified: Secondary | ICD-10-CM | POA: Diagnosis not present

## 2018-07-31 DIAGNOSIS — R319 Hematuria, unspecified: Secondary | ICD-10-CM | POA: Diagnosis not present

## 2018-07-31 DIAGNOSIS — E039 Hypothyroidism, unspecified: Secondary | ICD-10-CM | POA: Diagnosis not present

## 2018-07-31 DIAGNOSIS — E1043 Type 1 diabetes mellitus with diabetic autonomic (poly)neuropathy: Secondary | ICD-10-CM | POA: Diagnosis not present

## 2018-07-31 DIAGNOSIS — D696 Thrombocytopenia, unspecified: Secondary | ICD-10-CM | POA: Diagnosis not present

## 2018-07-31 DIAGNOSIS — D509 Iron deficiency anemia, unspecified: Secondary | ICD-10-CM | POA: Diagnosis not present

## 2018-07-31 DIAGNOSIS — N184 Chronic kidney disease, stage 4 (severe): Secondary | ICD-10-CM | POA: Diagnosis not present

## 2018-07-31 DIAGNOSIS — E875 Hyperkalemia: Secondary | ICD-10-CM | POA: Diagnosis not present

## 2018-07-31 DIAGNOSIS — E611 Iron deficiency: Secondary | ICD-10-CM | POA: Diagnosis not present

## 2018-07-31 DIAGNOSIS — E1022 Type 1 diabetes mellitus with diabetic chronic kidney disease: Secondary | ICD-10-CM | POA: Diagnosis not present

## 2018-07-31 DIAGNOSIS — D61818 Other pancytopenia: Secondary | ICD-10-CM | POA: Diagnosis not present

## 2018-07-31 DIAGNOSIS — D638 Anemia in other chronic diseases classified elsewhere: Secondary | ICD-10-CM | POA: Diagnosis not present

## 2018-07-31 DIAGNOSIS — K3184 Gastroparesis: Secondary | ICD-10-CM | POA: Diagnosis not present

## 2018-08-01 DIAGNOSIS — E1022 Type 1 diabetes mellitus with diabetic chronic kidney disease: Secondary | ICD-10-CM | POA: Diagnosis not present

## 2018-08-01 DIAGNOSIS — N184 Chronic kidney disease, stage 4 (severe): Secondary | ICD-10-CM | POA: Diagnosis not present

## 2018-08-01 DIAGNOSIS — E611 Iron deficiency: Secondary | ICD-10-CM | POA: Diagnosis not present

## 2018-08-07 ENCOUNTER — Other Ambulatory Visit: Payer: Self-pay

## 2018-08-07 DIAGNOSIS — E039 Hypothyroidism, unspecified: Secondary | ICD-10-CM

## 2018-08-07 MED ORDER — LEVOTHYROXINE SODIUM 200 MCG PO TABS: 200.0000 ug | ORAL_TABLET | Freq: Every day | ORAL | 1 refills | Status: DC

## 2018-08-07 MED FILL — LEVOTHYROXINE 200 MCG TAB: 200 | 30 days supply | Qty: 30 | Fill #0

## 2018-08-14 ENCOUNTER — Encounter: Payer: Self-pay | Admitting: Internal Medicine

## 2018-08-14 DIAGNOSIS — H52213 Irregular astigmatism, bilateral: Secondary | ICD-10-CM | POA: Diagnosis not present

## 2018-08-14 DIAGNOSIS — E113293 Type 2 diabetes mellitus with mild nonproliferative diabetic retinopathy without macular edema, bilateral: Secondary | ICD-10-CM | POA: Diagnosis not present

## 2018-08-14 DIAGNOSIS — H04123 Dry eye syndrome of bilateral lacrimal glands: Secondary | ICD-10-CM | POA: Diagnosis not present

## 2018-08-14 LAB — HM DIABETES EYE EXAM

## 2018-08-16 MED FILL — AMLODIPINE BESYLATE 10 MG T: 10 | 30 days supply | Qty: 30 | Fill #1

## 2018-08-24 ENCOUNTER — Encounter (HOSPITAL_COMMUNITY): Payer: Self-pay | Admitting: Emergency Medicine

## 2018-08-24 DIAGNOSIS — R109 Unspecified abdominal pain: Secondary | ICD-10-CM | POA: Diagnosis not present

## 2018-08-24 DIAGNOSIS — R1031 Right lower quadrant pain: Secondary | ICD-10-CM | POA: Insufficient documentation

## 2018-08-24 DIAGNOSIS — E1022 Type 1 diabetes mellitus with diabetic chronic kidney disease: Secondary | ICD-10-CM | POA: Diagnosis not present

## 2018-08-24 DIAGNOSIS — G43A0 Cyclical vomiting, not intractable: Secondary | ICD-10-CM | POA: Insufficient documentation

## 2018-08-24 DIAGNOSIS — N183 Chronic kidney disease, stage 3 (moderate): Secondary | ICD-10-CM | POA: Insufficient documentation

## 2018-08-24 DIAGNOSIS — Z87891 Personal history of nicotine dependence: Secondary | ICD-10-CM | POA: Diagnosis not present

## 2018-08-24 DIAGNOSIS — F1290 Cannabis use, unspecified, uncomplicated: Secondary | ICD-10-CM | POA: Insufficient documentation

## 2018-08-24 DIAGNOSIS — E039 Hypothyroidism, unspecified: Secondary | ICD-10-CM | POA: Diagnosis not present

## 2018-08-24 DIAGNOSIS — Z79899 Other long term (current) drug therapy: Secondary | ICD-10-CM | POA: Insufficient documentation

## 2018-08-24 DIAGNOSIS — R110 Nausea: Secondary | ICD-10-CM | POA: Diagnosis not present

## 2018-08-24 DIAGNOSIS — I129 Hypertensive chronic kidney disease with stage 1 through stage 4 chronic kidney disease, or unspecified chronic kidney disease: Secondary | ICD-10-CM | POA: Diagnosis not present

## 2018-08-24 LAB — COMPREHENSIVE METABOLIC PANEL
ALK PHOS: 71 U/L (ref 38–126)
ALT: 15 U/L (ref 0–44)
AST: 28 U/L (ref 15–41)
Albumin: 4.4 g/dL (ref 3.5–5.0)
Anion gap: 11 (ref 5–15)
BUN: 26 mg/dL — AB (ref 6–20)
CALCIUM: 9.9 mg/dL (ref 8.9–10.3)
CHLORIDE: 102 mmol/L (ref 98–111)
CO2: 26 mmol/L (ref 22–32)
CREATININE: 3.14 mg/dL — AB (ref 0.44–1.00)
GFR calc Af Amer: 21 mL/min — ABNORMAL LOW (ref >60)
GFR, EST NON AFRICAN AMERICAN: 18 mL/min — AB (ref >60)
Glucose, Bld: 117 mg/dL — ABNORMAL HIGH (ref 70–99)
Potassium: 4.5 mmol/L (ref 3.5–5.1)
Sodium: 139 mmol/L (ref 135–145)
Total Bilirubin: 0.7 mg/dL (ref 0.3–1.2)
Total Protein: 7.6 g/dL (ref 6.5–8.1)

## 2018-08-24 LAB — CBC
HCT: 29.2 % — ABNORMAL LOW (ref 36.0–46.0)
Hemoglobin: 9.3 g/dL — ABNORMAL LOW (ref 12.0–15.0)
MCH: 28.8 pg (ref 26.0–34.0)
MCHC: 31.8 g/dL (ref 30.0–36.0)
MCV: 90.4 fL (ref 78.0–100.0)
PLATELETS: 168 10*3/uL (ref 150–400)
RBC: 3.23 MIL/uL — ABNORMAL LOW (ref 3.87–5.11)
RDW: 11.9 % (ref 11.5–15.5)
WBC: 5.5 10*3/uL (ref 4.0–10.5)

## 2018-08-24 LAB — I-STAT BETA HCG BLOOD, ED (MC, WL, AP ONLY): I-stat hCG, quantitative: <5 m[IU]/mL (ref <5)

## 2018-08-24 LAB — LIPASE, BLOOD: LIPASE: 40 U/L (ref 11–51)

## 2018-08-24 NOTE — ED Triage Notes (Signed)
Patient here from home with complaints of right flank pain, nausea, vomiting x2 weeks. Hx of type 1 diabetes and CKD.

## 2018-08-25 ENCOUNTER — Emergency Department (HOSPITAL_COMMUNITY)
Admission: EM | Admit: 2018-08-25 | Discharge: 2018-08-25 | Disposition: A | Discharge status: Home / Self Care | Payer: Medicaid Other | Attending: Emergency Medicine | Admitting: Emergency Medicine

## 2018-08-25 DIAGNOSIS — R109 Unspecified abdominal pain: Secondary | ICD-10-CM

## 2018-08-25 DIAGNOSIS — R1115 Cyclical vomiting syndrome unrelated to migraine: Secondary | ICD-10-CM

## 2018-08-25 LAB — URINALYSIS, ROUTINE W REFLEX MICROSCOPIC
Bilirubin Urine: NEGATIVE
Glucose, UA: >=500 mg/dL — AB
Hgb urine dipstick: NEGATIVE
KETONES UR: NEGATIVE mg/dL
Leukocytes, UA: NEGATIVE
NITRITE: NEGATIVE
PH: 8 (ref 5.0–8.0)
Protein, ur: 100 mg/dL — AB
SPECIFIC GRAVITY, URINE: 1.006 (ref 1.005–1.030)

## 2018-08-25 MED ORDER — METOCLOPRAMIDE HCL 5 MG/ML IJ SOLN
10.0000 mg | Freq: Once | INTRAMUSCULAR | Status: AC
  Administered 2018-08-25: 10 mg via INTRAMUSCULAR
  Filled 2018-08-25: qty 2
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
## 2018-08-25 NOTE — Discharge Instructions (Addendum)
Continue taking your Reglan and hydrate as much as possible.    Abdominal (belly) pain can be caused by many things. Your caregiver performed an examination and possibly ordered blood/urine tests and imaging (CT scan, x-rays, ultrasound). Many cases can be observed and treated at home after initial evaluation in the emergency department. Even though you are being discharged home, abdominal pain can be unpredictable. Therefore, you need a repeated exam if your pain does not resolve, returns, or worsens. Most patients with abdominal pain don't have to be admitted to the hospital or have surgery, but serious problems like appendicitis and gallbladder attacks can start out as nonspecific pain. Many abdominal conditions cannot be diagnosed in one visit, so follow-up evaluations are very important. SEEK IMMEDIATE MEDICAL ATTENTION IF: The pain does not go away or becomes severe.  A temperature above 101 develops.  Repeated vomiting occurs (multiple episodes).  The pain becomes localized to portions of the abdomen. The right side could possibly be appendicitis. In an adult, the left lower portion of the abdomen could be colitis or diverticulitis.  Blood is being passed in stools or vomit (bright red or black tarry stools).  Return also if you develop chest pain, difficulty breathing, dizziness or fainting, or become confused, poorly responsive, or inconsolable (young children).

## 2018-08-25 NOTE — ED Notes (Signed)
Oral hydration offered to the patient.

## 2018-08-31 ENCOUNTER — Ambulatory Visit: Payer: Medicaid Other | Attending: Internal Medicine | Admitting: Internal Medicine

## 2018-08-31 ENCOUNTER — Encounter: Payer: Self-pay | Admitting: Internal Medicine

## 2018-08-31 VITALS — BP 154/94 | HR 109 | Temp 98.1°F | Resp 16 | Wt 128.0 lb

## 2018-08-31 DIAGNOSIS — F419 Anxiety disorder, unspecified: Secondary | ICD-10-CM | POA: Insufficient documentation

## 2018-08-31 DIAGNOSIS — Z794 Long term (current) use of insulin: Secondary | ICD-10-CM | POA: Insufficient documentation

## 2018-08-31 DIAGNOSIS — N184 Chronic kidney disease, stage 4 (severe): Secondary | ICD-10-CM | POA: Insufficient documentation

## 2018-08-31 DIAGNOSIS — I129 Hypertensive chronic kidney disease with stage 1 through stage 4 chronic kidney disease, or unspecified chronic kidney disease: Secondary | ICD-10-CM | POA: Insufficient documentation

## 2018-08-31 DIAGNOSIS — E1043 Type 1 diabetes mellitus with diabetic autonomic (poly)neuropathy: Secondary | ICD-10-CM | POA: Insufficient documentation

## 2018-08-31 DIAGNOSIS — D631 Anemia in chronic kidney disease: Secondary | ICD-10-CM | POA: Diagnosis not present

## 2018-08-31 DIAGNOSIS — Z881 Allergy status to other antibiotic agents status: Secondary | ICD-10-CM | POA: Diagnosis not present

## 2018-08-31 DIAGNOSIS — K3184 Gastroparesis: Secondary | ICD-10-CM | POA: Diagnosis not present

## 2018-08-31 DIAGNOSIS — G4700 Insomnia, unspecified: Secondary | ICD-10-CM | POA: Insufficient documentation

## 2018-08-31 DIAGNOSIS — N2581 Secondary hyperparathyroidism of renal origin: Secondary | ICD-10-CM | POA: Diagnosis not present

## 2018-08-31 DIAGNOSIS — K219 Gastro-esophageal reflux disease without esophagitis: Secondary | ICD-10-CM | POA: Insufficient documentation

## 2018-08-31 DIAGNOSIS — E785 Hyperlipidemia, unspecified: Secondary | ICD-10-CM | POA: Insufficient documentation

## 2018-08-31 DIAGNOSIS — E1029 Type 1 diabetes mellitus with other diabetic kidney complication: Secondary | ICD-10-CM

## 2018-08-31 DIAGNOSIS — Z23 Encounter for immunization: Secondary | ICD-10-CM | POA: Insufficient documentation

## 2018-08-31 DIAGNOSIS — F329 Major depressive disorder, single episode, unspecified: Secondary | ICD-10-CM | POA: Diagnosis not present

## 2018-08-31 DIAGNOSIS — D61818 Other pancytopenia: Secondary | ICD-10-CM | POA: Diagnosis not present

## 2018-08-31 DIAGNOSIS — E1040 Type 1 diabetes mellitus with diabetic neuropathy, unspecified: Secondary | ICD-10-CM | POA: Diagnosis present

## 2018-08-31 DIAGNOSIS — E1022 Type 1 diabetes mellitus with diabetic chronic kidney disease: Secondary | ICD-10-CM | POA: Insufficient documentation

## 2018-08-31 DIAGNOSIS — I10 Essential (primary) hypertension: Secondary | ICD-10-CM | POA: Diagnosis not present

## 2018-08-31 DIAGNOSIS — E039 Hypothyroidism, unspecified: Secondary | ICD-10-CM | POA: Diagnosis not present

## 2018-08-31 DIAGNOSIS — N319 Neuromuscular dysfunction of bladder, unspecified: Secondary | ICD-10-CM | POA: Diagnosis not present

## 2018-08-31 DIAGNOSIS — Z888 Allergy status to other drugs, medicaments and biological substances status: Secondary | ICD-10-CM | POA: Insufficient documentation

## 2018-08-31 DIAGNOSIS — Z87891 Personal history of nicotine dependence: Secondary | ICD-10-CM | POA: Diagnosis not present

## 2018-08-31 DIAGNOSIS — E10319 Type 1 diabetes mellitus with unspecified diabetic retinopathy without macular edema: Secondary | ICD-10-CM | POA: Insufficient documentation

## 2018-08-31 DIAGNOSIS — Z882 Allergy status to sulfonamides status: Secondary | ICD-10-CM | POA: Diagnosis not present

## 2018-08-31 DIAGNOSIS — Z79899 Other long term (current) drug therapy: Secondary | ICD-10-CM | POA: Insufficient documentation

## 2018-08-31 LAB — GLUCOSE, POCT (MANUAL RESULT ENTRY): POC Glucose: 100 mg/dl — AB (ref 70–99)

## 2018-08-31 LAB — POCT GLYCOSYLATED HEMOGLOBIN (HGB A1C): HBA1C, POC (CONTROLLED DIABETIC RANGE): 9 % — AB (ref 0.0–7.0)

## 2018-08-31 MED ORDER — SODIUM BICARBONATE 650 MG PO TABS: 650.0000 mg | ORAL_TABLET | Freq: Two times a day (BID) | ORAL | 4 refills | Status: DC
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
## 2018-08-31 NOTE — Progress Notes (Signed)
Pt states the only issue

## 2018-08-31 NOTE — Patient Instructions (Signed)

## 2018-09-07 ENCOUNTER — Other Ambulatory Visit: Payer: Self-pay

## 2018-09-07 ENCOUNTER — Encounter (HOSPITAL_COMMUNITY): Payer: Self-pay | Admitting: Emergency Medicine

## 2018-09-07 DIAGNOSIS — Y9383 Activity, rough housing and horseplay: Secondary | ICD-10-CM | POA: Diagnosis not present

## 2018-09-07 DIAGNOSIS — J45909 Unspecified asthma, uncomplicated: Secondary | ICD-10-CM | POA: Diagnosis not present

## 2018-09-07 DIAGNOSIS — W51XXXA Accidental striking against or bumped into by another person, initial encounter: Secondary | ICD-10-CM | POA: Insufficient documentation

## 2018-09-07 DIAGNOSIS — R0781 Pleurodynia: Secondary | ICD-10-CM | POA: Diagnosis not present

## 2018-09-07 DIAGNOSIS — Z87891 Personal history of nicotine dependence: Secondary | ICD-10-CM | POA: Diagnosis not present

## 2018-09-07 DIAGNOSIS — I129 Hypertensive chronic kidney disease with stage 1 through stage 4 chronic kidney disease, or unspecified chronic kidney disease: Secondary | ICD-10-CM | POA: Diagnosis not present

## 2018-09-07 DIAGNOSIS — E1022 Type 1 diabetes mellitus with diabetic chronic kidney disease: Secondary | ICD-10-CM | POA: Diagnosis not present

## 2018-09-07 DIAGNOSIS — S299XXA Unspecified injury of thorax, initial encounter: Secondary | ICD-10-CM | POA: Diagnosis present

## 2018-09-07 DIAGNOSIS — E039 Hypothyroidism, unspecified: Secondary | ICD-10-CM | POA: Insufficient documentation

## 2018-09-07 DIAGNOSIS — Z794 Long term (current) use of insulin: Secondary | ICD-10-CM | POA: Diagnosis not present

## 2018-09-07 DIAGNOSIS — N183 Chronic kidney disease, stage 3 (moderate): Secondary | ICD-10-CM | POA: Insufficient documentation

## 2018-09-07 DIAGNOSIS — Y929 Unspecified place or not applicable: Secondary | ICD-10-CM | POA: Diagnosis not present

## 2018-09-07 DIAGNOSIS — S20211A Contusion of right front wall of thorax, initial encounter: Secondary | ICD-10-CM | POA: Diagnosis not present

## 2018-09-07 DIAGNOSIS — E103299 Type 1 diabetes mellitus with mild nonproliferative diabetic retinopathy without macular edema, unspecified eye: Secondary | ICD-10-CM | POA: Diagnosis not present

## 2018-09-07 DIAGNOSIS — Y999 Unspecified external cause status: Secondary | ICD-10-CM | POA: Insufficient documentation
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
## 2018-09-08 ENCOUNTER — Emergency Department (HOSPITAL_COMMUNITY)
Admission: EM | Admit: 2018-09-08 | Discharge: 2018-09-08 | Disposition: A | Discharge status: Home / Self Care | Payer: Medicaid Other | Attending: Emergency Medicine | Admitting: Emergency Medicine

## 2018-09-08 ENCOUNTER — Emergency Department (HOSPITAL_COMMUNITY): Payer: Medicaid Other

## 2018-09-08 DIAGNOSIS — R0781 Pleurodynia: Secondary | ICD-10-CM | POA: Diagnosis not present

## 2018-09-08 DIAGNOSIS — S20211A Contusion of right front wall of thorax, initial encounter: Secondary | ICD-10-CM

## 2018-09-08 LAB — CBG MONITORING, ED: GLUCOSE-CAPILLARY: 293 mg/dL — AB (ref 70–99)

## 2018-09-08 MED ORDER — TRAMADOL HCL 50 MG PO TABS: 50.0000 mg | ORAL_TABLET | Freq: Four times a day (QID) | ORAL | 0 refills | Status: DC | PRN
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
## 2018-09-08 NOTE — Discharge Instructions (Addendum)
Tramadol as prescribed as needed for pain.  Follow-up with your primary doctor if symptoms are not improving in the next week.

## 2018-09-09 DIAGNOSIS — R011 Cardiac murmur, unspecified: Secondary | ICD-10-CM | POA: Diagnosis not present

## 2018-09-09 DIAGNOSIS — R000 Tachycardia, unspecified: Secondary | ICD-10-CM | POA: Diagnosis not present

## 2018-09-09 DIAGNOSIS — E611 Iron deficiency: Secondary | ICD-10-CM | POA: Diagnosis not present

## 2018-09-09 DIAGNOSIS — Z3202 Encounter for pregnancy test, result negative: Secondary | ICD-10-CM | POA: Diagnosis not present

## 2018-09-09 DIAGNOSIS — R0781 Pleurodynia: Secondary | ICD-10-CM | POA: Diagnosis not present

## 2018-09-09 DIAGNOSIS — I10 Essential (primary) hypertension: Secondary | ICD-10-CM | POA: Diagnosis not present

## 2018-09-09 DIAGNOSIS — R300 Dysuria: Secondary | ICD-10-CM | POA: Diagnosis not present

## 2018-09-09 DIAGNOSIS — J45909 Unspecified asthma, uncomplicated: Secondary | ICD-10-CM | POA: Diagnosis not present

## 2018-09-09 DIAGNOSIS — E1029 Type 1 diabetes mellitus with other diabetic kidney complication: Secondary | ICD-10-CM | POA: Diagnosis not present

## 2018-09-09 DIAGNOSIS — E1022 Type 1 diabetes mellitus with diabetic chronic kidney disease: Secondary | ICD-10-CM | POA: Diagnosis not present

## 2018-09-09 DIAGNOSIS — I129 Hypertensive chronic kidney disease with stage 1 through stage 4 chronic kidney disease, or unspecified chronic kidney disease: Secondary | ICD-10-CM | POA: Diagnosis not present

## 2018-09-09 DIAGNOSIS — E785 Hyperlipidemia, unspecified: Secondary | ICD-10-CM | POA: Diagnosis not present

## 2018-09-09 DIAGNOSIS — R1011 Right upper quadrant pain: Secondary | ICD-10-CM | POA: Diagnosis not present

## 2018-09-09 DIAGNOSIS — N184 Chronic kidney disease, stage 4 (severe): Secondary | ICD-10-CM | POA: Diagnosis not present

## 2018-09-09 DIAGNOSIS — E875 Hyperkalemia: Secondary | ICD-10-CM | POA: Diagnosis not present

## 2018-09-09 DIAGNOSIS — R0789 Other chest pain: Secondary | ICD-10-CM | POA: Diagnosis not present

## 2018-09-09 DIAGNOSIS — E039 Hypothyroidism, unspecified: Secondary | ICD-10-CM | POA: Diagnosis not present

## 2018-09-10 DIAGNOSIS — D500 Iron deficiency anemia secondary to blood loss (chronic): Secondary | ICD-10-CM | POA: Diagnosis not present

## 2018-09-10 DIAGNOSIS — R011 Cardiac murmur, unspecified: Secondary | ICD-10-CM | POA: Diagnosis not present

## 2018-09-10 DIAGNOSIS — E1022 Type 1 diabetes mellitus with diabetic chronic kidney disease: Secondary | ICD-10-CM | POA: Diagnosis not present

## 2018-09-10 DIAGNOSIS — E10319 Type 1 diabetes mellitus with unspecified diabetic retinopathy without macular edema: Secondary | ICD-10-CM | POA: Diagnosis not present

## 2018-09-10 DIAGNOSIS — R300 Dysuria: Secondary | ICD-10-CM | POA: Diagnosis not present

## 2018-09-10 DIAGNOSIS — I129 Hypertensive chronic kidney disease with stage 1 through stage 4 chronic kidney disease, or unspecified chronic kidney disease: Secondary | ICD-10-CM | POA: Diagnosis not present

## 2018-09-10 DIAGNOSIS — R000 Tachycardia, unspecified: Secondary | ICD-10-CM | POA: Diagnosis not present

## 2018-09-10 DIAGNOSIS — E1040 Type 1 diabetes mellitus with diabetic neuropathy, unspecified: Secondary | ICD-10-CM | POA: Diagnosis not present

## 2018-09-10 DIAGNOSIS — K3184 Gastroparesis: Secondary | ICD-10-CM | POA: Diagnosis not present

## 2018-09-10 DIAGNOSIS — E875 Hyperkalemia: Secondary | ICD-10-CM | POA: Diagnosis not present

## 2018-09-10 DIAGNOSIS — N184 Chronic kidney disease, stage 4 (severe): Secondary | ICD-10-CM | POA: Diagnosis not present

## 2018-09-10 DIAGNOSIS — R079 Chest pain, unspecified: Secondary | ICD-10-CM | POA: Diagnosis not present

## 2018-09-10 DIAGNOSIS — E1043 Type 1 diabetes mellitus with diabetic autonomic (poly)neuropathy: Secondary | ICD-10-CM | POA: Diagnosis not present

## 2018-09-14 ENCOUNTER — Ambulatory Visit: Payer: Medicaid Other | Admitting: Podiatry

## 2018-09-14 MED FILL — AMLODIPINE BESYLATE 10 MG T: 10 | 30 days supply | Qty: 30 | Fill #2

## 2018-09-14 MED FILL — LEVOTHYROXINE 200 MCG TAB: 200 | 30 days supply | Qty: 30 | Fill #1

## 2018-09-14 MED FILL — SODIUM BICARB 650 MG TABLET: 650 | 30 days supply | Qty: 60 | Fill #0

## 2018-09-14 MED FILL — PROAIR HFA 90 MCG INHALER: 108 (90 BAS | 25 days supply | Qty: 9 | Fill #1

## 2018-09-20 DIAGNOSIS — N184 Chronic kidney disease, stage 4 (severe): Secondary | ICD-10-CM | POA: Diagnosis not present

## 2018-09-20 DIAGNOSIS — E039 Hypothyroidism, unspecified: Secondary | ICD-10-CM | POA: Diagnosis not present

## 2018-09-20 DIAGNOSIS — E1022 Type 1 diabetes mellitus with diabetic chronic kidney disease: Secondary | ICD-10-CM | POA: Diagnosis not present

## 2018-09-25 DIAGNOSIS — E109 Type 1 diabetes mellitus without complications: Secondary | ICD-10-CM | POA: Diagnosis not present

## 2018-10-04 ENCOUNTER — Ambulatory Visit: Payer: Medicaid Other | Admitting: Podiatry

## 2018-10-04 ENCOUNTER — Encounter: Payer: Self-pay | Admitting: Podiatry

## 2018-10-04 DIAGNOSIS — M7752 Other enthesopathy of left foot: Secondary | ICD-10-CM | POA: Diagnosis not present

## 2018-10-04 DIAGNOSIS — M25572 Pain in left ankle and joints of left foot: Secondary | ICD-10-CM | POA: Diagnosis not present

## 2018-10-04 DIAGNOSIS — M7751 Other enthesopathy of right foot: Secondary | ICD-10-CM | POA: Diagnosis not present

## 2018-10-04 DIAGNOSIS — M25571 Pain in right ankle and joints of right foot: Secondary | ICD-10-CM
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
## 2018-10-16 DIAGNOSIS — E1022 Type 1 diabetes mellitus with diabetic chronic kidney disease: Secondary | ICD-10-CM | POA: Diagnosis not present

## 2018-10-16 DIAGNOSIS — N184 Chronic kidney disease, stage 4 (severe): Secondary | ICD-10-CM | POA: Diagnosis not present

## 2018-10-19 DIAGNOSIS — E109 Type 1 diabetes mellitus without complications: Secondary | ICD-10-CM | POA: Diagnosis not present

## 2018-10-22 ENCOUNTER — Telehealth: Payer: Self-pay

## 2018-10-22 NOTE — Telephone Encounter (Signed)
I received a voicemail from the patient asking for help obtaining a CGM like the Dexcom system. The patient has Medicaid and I am unfamiliar with their requirements to approve a CGM. Her chart shows she has been seen at Endo at [facility] I am going to encourage her to reach out to them as they have more experience prescribing and getting CGM's approved. Based on my review of her chart she is an ideal candidate for a device like dexcom, it just isn't something this office typically handles.   Left VM for pt to reach out to endocrinology for this.

## 2018-10-23 DIAGNOSIS — N184 Chronic kidney disease, stage 4 (severe): Secondary | ICD-10-CM | POA: Diagnosis not present

## 2018-10-23 DIAGNOSIS — E1022 Type 1 diabetes mellitus with diabetic chronic kidney disease: Secondary | ICD-10-CM | POA: Diagnosis not present

## 2018-11-05 DIAGNOSIS — E109 Type 1 diabetes mellitus without complications: Secondary | ICD-10-CM | POA: Diagnosis not present

## 2018-11-08 ENCOUNTER — Ambulatory Visit: Payer: Medicaid Other | Admitting: Internal Medicine

## 2018-11-09 ENCOUNTER — Ambulatory Visit: Payer: Medicaid Other

## 2018-11-13 ENCOUNTER — Other Ambulatory Visit: Payer: Self-pay | Admitting: Internal Medicine

## 2018-11-13 DIAGNOSIS — E1042 Type 1 diabetes mellitus with diabetic polyneuropathy: Secondary | ICD-10-CM

## 2018-11-13 DIAGNOSIS — E039 Hypothyroidism, unspecified: Secondary | ICD-10-CM

## 2018-11-13 MED FILL — LEVOTHYROXINE 200 MCG TAB: 200 | 30 days supply | Qty: 30 | Fill #0

## 2018-11-13 MED FILL — METOCLOPRAMIDE 5 MG TABLET: 5 | 30 days supply | Qty: 90 | Fill #0

## 2018-11-13 MED FILL — SODIUM BICARB 650 MG TABLET: 650 | 30 days supply | Qty: 60 | Fill #1

## 2018-11-13 MED FILL — PROAIR HFA 90 MCG INHALER: 108 (90 BAS | 25 days supply | Qty: 9 | Fill #2

## 2018-11-13 MED FILL — AMLODIPINE BESYLATE 10 MG T: 10 | 30 days supply | Qty: 30 | Fill #3

## 2018-11-13 MED FILL — ESCITALOPRAM 10 MG TABLET: 10 | 30 days supply | Qty: 30 | Fill #1

## 2018-11-13 NOTE — Telephone Encounter (Signed)
Last rx was written by Dr. Wynetta Emery on 08/08/2017. Can't refill because it's not a recent rx. Will forward to pcp

## 2018-11-14 ENCOUNTER — Ambulatory Visit: Payer: Medicaid Other | Attending: Internal Medicine | Admitting: Physician Assistant

## 2018-11-14 ENCOUNTER — Ambulatory Visit (HOSPITAL_BASED_OUTPATIENT_CLINIC_OR_DEPARTMENT_OTHER): Payer: Medicaid Other | Admitting: Licensed Clinical Social Worker

## 2018-11-14 VITALS — BP 149/96 | HR 91 | Temp 97.6°F | Resp 18 | Ht 65.0 in | Wt 137.0 lb

## 2018-11-14 DIAGNOSIS — I10 Essential (primary) hypertension: Secondary | ICD-10-CM | POA: Diagnosis not present

## 2018-11-14 DIAGNOSIS — Z87891 Personal history of nicotine dependence: Secondary | ICD-10-CM | POA: Insufficient documentation

## 2018-11-14 DIAGNOSIS — N183 Chronic kidney disease, stage 3 (moderate): Secondary | ICD-10-CM | POA: Insufficient documentation

## 2018-11-14 DIAGNOSIS — J45909 Unspecified asthma, uncomplicated: Secondary | ICD-10-CM | POA: Diagnosis not present

## 2018-11-14 DIAGNOSIS — Z79890 Hormone replacement therapy (postmenopausal): Secondary | ICD-10-CM | POA: Diagnosis not present

## 2018-11-14 DIAGNOSIS — Z79899 Other long term (current) drug therapy: Secondary | ICD-10-CM | POA: Insufficient documentation

## 2018-11-14 DIAGNOSIS — I129 Hypertensive chronic kidney disease with stage 1 through stage 4 chronic kidney disease, or unspecified chronic kidney disease: Secondary | ICD-10-CM | POA: Diagnosis not present

## 2018-11-14 DIAGNOSIS — E108 Type 1 diabetes mellitus with unspecified complications: Secondary | ICD-10-CM

## 2018-11-14 DIAGNOSIS — F332 Major depressive disorder, recurrent severe without psychotic features: Secondary | ICD-10-CM | POA: Diagnosis not present

## 2018-11-14 DIAGNOSIS — E785 Hyperlipidemia, unspecified: Secondary | ICD-10-CM | POA: Diagnosis not present

## 2018-11-14 DIAGNOSIS — F329 Major depressive disorder, single episode, unspecified: Secondary | ICD-10-CM | POA: Insufficient documentation

## 2018-11-14 DIAGNOSIS — E1065 Type 1 diabetes mellitus with hyperglycemia: Secondary | ICD-10-CM | POA: Insufficient documentation

## 2018-11-14 DIAGNOSIS — F1920 Other psychoactive substance dependence, uncomplicated: Secondary | ICD-10-CM | POA: Diagnosis not present

## 2018-11-14 DIAGNOSIS — Z882 Allergy status to sulfonamides status: Secondary | ICD-10-CM | POA: Insufficient documentation

## 2018-11-14 DIAGNOSIS — Z888 Allergy status to other drugs, medicaments and biological substances status: Secondary | ICD-10-CM | POA: Insufficient documentation

## 2018-11-14 DIAGNOSIS — G4700 Insomnia, unspecified: Secondary | ICD-10-CM | POA: Insufficient documentation

## 2018-11-14 DIAGNOSIS — E039 Hypothyroidism, unspecified: Secondary | ICD-10-CM | POA: Diagnosis not present

## 2018-11-14 DIAGNOSIS — Z9641 Presence of insulin pump (external) (internal): Secondary | ICD-10-CM | POA: Diagnosis not present

## 2018-11-14 DIAGNOSIS — E1022 Type 1 diabetes mellitus with diabetic chronic kidney disease: Secondary | ICD-10-CM | POA: Diagnosis not present

## 2018-11-14 DIAGNOSIS — Z794 Long term (current) use of insulin: Secondary | ICD-10-CM | POA: Diagnosis not present

## 2018-11-14 DIAGNOSIS — F419 Anxiety disorder, unspecified: Secondary | ICD-10-CM | POA: Insufficient documentation

## 2018-11-14 DIAGNOSIS — IMO0002 Reserved for concepts with insufficient information to code with codable children: Secondary | ICD-10-CM

## 2018-11-14 DIAGNOSIS — E1040 Type 1 diabetes mellitus with diabetic neuropathy, unspecified: Secondary | ICD-10-CM | POA: Diagnosis not present

## 2018-11-14 DIAGNOSIS — Z881 Allergy status to other antibiotic agents status: Secondary | ICD-10-CM | POA: Diagnosis not present

## 2018-11-14 LAB — GLUCOSE, POCT (MANUAL RESULT ENTRY): POC Glucose: 198 mg/dl — AB (ref 70–99)

## 2018-11-14 MED ORDER — BUSPIRONE HCL 10 MG PO TABS: 10.0000 mg | ORAL_TABLET | Freq: Two times a day (BID) | ORAL | 3 refills | Status: DC

## 2018-11-14 MED ORDER — ESCITALOPRAM OXALATE 20 MG PO TABS: 20.0000 mg | ORAL_TABLET | Freq: Every day | ORAL | 3 refills | Status: DC

## 2018-11-14 MED FILL — GABAPENTIN 300 MG CAPSULE: 300 | 30 days supply | Qty: 60 | Fill #0

## 2018-11-14 MED FILL — busPIRone HCL 10 MG TABS: 10 | 30 days supply | Qty: 60 | Fill #0

## 2018-11-14 NOTE — Patient Instructions (Signed)
Stress and Stress Management Stress is a normal reaction to life events. It is what you feel when life demands more than you are used to or more than you can handle. Some stress can be useful. For example, the stress reaction can help you catch the last bus of the day, study for a test, or meet a deadline at work. But stress that occurs too often or for too long can cause problems. It can affect your emotional health and interfere with relationships and normal daily activities. Too much stress can weaken your immune system and increase your risk for physical illness. If you already have a medical problem, stress can make it worse. What are the causes? All sorts of life events may cause stress. An event that causes stress for one person may not be stressful for another person. Major life events commonly cause stress. These may be positive or negative. Examples include losing your job, moving into a new home, getting married, having a baby, or losing a loved one. Less obvious life events may also cause stress, especially if they occur day after day or in combination. Examples include working long hours, driving in traffic, caring for children, being in debt, or being in a difficult relationship. What are the signs or symptoms? Stress may cause emotional symptoms including, the following:  Anxiety. This is feeling worried, afraid, on edge, overwhelmed, or out of control.  Anger. This is feeling irritated or impatient.  Depression. This is feeling sad, down, helpless, or guilty.  Difficulty focusing, remembering, or making decisions.  Stress may cause physical symptoms, including the following:  Aches and pains. These may affect your head, neck, back, stomach, or other areas of your body.  Tight muscles or clenched jaw.  Low energy or trouble sleeping.  Stress may cause unhealthy behaviors, including the following:  Eating to feel better (overeating) or skipping meals.  Sleeping too little,  too much, or both.  Working too much or putting off tasks (procrastination).  Smoking, drinking alcohol, or using drugs to feel better.  How is this diagnosed? Stress is diagnosed through an assessment by your health care provider. Your health care provider will ask questions about your symptoms and any stressful life events.Your health care provider will also ask about your medical history and may order blood tests or other tests. Certain medical conditions and medicine can cause physical symptoms similar to stress. Mental illness can cause emotional symptoms and unhealthy behaviors similar to stress. Your health care provider may refer you to a mental health professional for further evaluation. How is this treated? Stress management is the recommended treatment for stress.The goals of stress management are reducing stressful life events and coping with stress in healthy ways. Techniques for reducing stressful life events include the following:  Stress identification. Self-monitor for stress and identify what causes stress for you. These skills may help you to avoid some stressful events.  Time management. Set your priorities, keep a calendar of events, and learn to say "no." These tools can help you avoid making too many commitments.  Techniques for coping with stress include the following:  Rethinking the problem. Try to think realistically about stressful events rather than ignoring them or overreacting. Try to find the positives in a stressful situation rather than focusing on the negatives.  Exercise. Physical exercise can release both physical and emotional tension. The key is to find a form of exercise you enjoy and do it regularly.  Relaxation techniques. These relax the body and  mind. Examples include yoga, meditation, tai chi, biofeedback, deep breathing, progressive muscle relaxation, listening to music, being out in nature, journaling, and other hobbies. Again, the key is to find  one or more that you enjoy and can do regularly.  Healthy lifestyle. Eat a balanced diet, get plenty of sleep, and do not smoke. Avoid using alcohol or drugs to relax.  Strong support network. Spend time with family, friends, or other people you enjoy being around.Express your feelings and talk things over with someone you trust.  Counseling or talktherapy with a mental health professional may be helpful if you are having difficulty managing stress on your own. Medicine is typically not recommended for the treatment of stress.Talk to your health care provider if you think you need medicine for symptoms of stress. Follow these instructions at home:  Keep all follow-up visits as directed by your health care provider.  Take all medicines as directed by your health care provider. Contact a health care provider if:  Your symptoms get worse or you start having new symptoms.  You feel overwhelmed by your problems and can no longer manage them on your own. Get help right away if:  You feel like hurting yourself or someone else. This information is not intended to replace advice given to you by your health care provider. Make sure you discuss any questions you have with your health care provider. Document Released: 05/31/2001 Document Revised: 05/12/2016 Document Reviewed: 07/30/2013 Elsevier Interactive Patient Education  2017 Elsevier Inc.  
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
## 2018-11-14 NOTE — BH Specialist Note (Signed)
Integrated Behavioral Health Initial Visit  MRN: [id number] Name: Olamide Lahaie  Number of [place] Clinician visits:: 1/6 Session Start time: 2:30 PM  Session End time: 3:00 PM Total time: 30 minutes  Type of Service: Magazine Interpretor:No. Interpretor Name and Language: N/A   SUBJECTIVE: Annamarie Yamaguchi is a 32 y.o. female accompanied by self Patient was referred by Weyman Pedro for depression, anxiety, and substance use. Patient reports the following symptoms/concerns: Pt has difficulty managing ongoing medical conditions. She is experiencing conflict with family and has a close friend who is on life support after having a stroke Duration of problem: Ongoing; Severity of problem: severe  OBJECTIVE: Mood: Angry, Anxious and Depressed and Affect: Depressed and Tearful Risk of harm to self or others: Suicidal ideation No plan to harm self or others Pt scored positive on phq9; however, denies current SI/HI/AVH. Protective factors were identified, safety plan was discussed, and crisis intervention resources provided  LIFE CONTEXT: Family and Social: Pt receives limited support from family and friends School/Work: Pt receives disability Self-Care: Pt has agreed to reinitiate medication management and medications to manage medical conditions. She is participating in TASK classes 2 x weekly. Pt has initiated behavioral health services through [facility] and has an upcoming appointment on Monday, December 2, 19 at 3:00 PM Life Changes: Pt has difficulty managing ongoing medical conditions. She is experiencing conflict with family and has a close friend who is on life support after having a stroke  GOALS ADDRESSED: Patient will: 1. Reduce symptoms of: agitation, anxiety, depression and stress 2. Increase knowledge and/or ability of: self-management skills  3. Demonstrate ability to: Increase healthy adjustment  to current life circumstances, Increase adequate support systems for patient/family, Increase motivation to adhere to plan of care, Improve medication compliance, Decrease self-medicating behaviors and Begin healthy grieving over loss  INTERVENTIONS: Interventions utilized: Motivational Interviewing, Mindfulness or Psychologist, educational and Supportive Counseling  Standardized Assessments completed: GAD-7 and PHQ 2&9 with C-SSRS  ASSESSMENT: Patient currently experiencing depression and anxiety triggered by difficulty managing ongoing medical conditions. She is experiencing conflict with family and has a close friend who is on life support after having a stroke. Pt receives limited support from family and friends. She scored positive on phq9; however, denies current SI/HI/AVH. Protective factors were identified, safety plan was discussed, and crisis intervention resources provided.   Patient has agreed to reinitiate medication management and medications to manage medical conditions. She is participating in TASK classes 2 x weekly. Pt has initiated behavioral health services through [facility] and has an upcoming appointment on Monday, December 2, 19 at 3:00 PM. Therapeutic strategies to assist with the management of stress, management of motivation, and decrease substance use (NA/AA meetings) were discussed to promote relaxation/mindfulness. Pt practiced gratitude thinking and was successful in identifying additional healthy coping skills.   PLAN: 1. Follow up with behavioral health clinician on : Pt was encouraged to contact LCSWA if symptoms worsen or fail to improve to schedule behavioral appointments at [facility]. 2. Behavioral recommendations: LCSWA recommends that pt apply healthy coping skills discussed, utilize provided resources, and follow up with [facility] at scheduled appointment.  3. Referral(s): [facility] (In Clinic) and Substance Abuse Program 4. "From  scale of 1-10, how likely are you to follow plan?":   Rebekah Chesterfield, LCSW 11/14/18 4:21 PM

## 2018-11-19 DIAGNOSIS — E109 Type 1 diabetes mellitus without complications: Secondary | ICD-10-CM | POA: Diagnosis not present

## 2018-11-21 ENCOUNTER — Encounter (HOSPITAL_COMMUNITY): Payer: Self-pay

## 2018-11-21 ENCOUNTER — Ambulatory Visit: Payer: Medicaid Other | Admitting: Nurse Practitioner

## 2018-11-21 ENCOUNTER — Emergency Department (HOSPITAL_COMMUNITY): Payer: Medicaid Other

## 2018-11-21 ENCOUNTER — Emergency Department (HOSPITAL_COMMUNITY)
Admission: EM | Admit: 2018-11-21 | Discharge: 2018-11-22 | Disposition: A | Discharge status: Home / Self Care | Payer: Medicaid Other | Attending: Emergency Medicine | Admitting: Emergency Medicine

## 2018-11-21 DIAGNOSIS — R079 Chest pain, unspecified: Secondary | ICD-10-CM | POA: Diagnosis not present

## 2018-11-21 DIAGNOSIS — Z79899 Other long term (current) drug therapy: Secondary | ICD-10-CM | POA: Diagnosis not present

## 2018-11-21 DIAGNOSIS — N200 Calculus of kidney: Secondary | ICD-10-CM | POA: Diagnosis not present

## 2018-11-21 DIAGNOSIS — R0789 Other chest pain: Secondary | ICD-10-CM | POA: Diagnosis not present

## 2018-11-21 DIAGNOSIS — R1012 Left upper quadrant pain: Secondary | ICD-10-CM | POA: Diagnosis not present

## 2018-11-21 DIAGNOSIS — E785 Hyperlipidemia, unspecified: Secondary | ICD-10-CM | POA: Insufficient documentation

## 2018-11-21 DIAGNOSIS — E039 Hypothyroidism, unspecified: Secondary | ICD-10-CM | POA: Insufficient documentation

## 2018-11-21 DIAGNOSIS — E1022 Type 1 diabetes mellitus with diabetic chronic kidney disease: Secondary | ICD-10-CM | POA: Diagnosis not present

## 2018-11-21 DIAGNOSIS — J45909 Unspecified asthma, uncomplicated: Secondary | ICD-10-CM | POA: Diagnosis not present

## 2018-11-21 DIAGNOSIS — F17210 Nicotine dependence, cigarettes, uncomplicated: Secondary | ICD-10-CM | POA: Insufficient documentation

## 2018-11-21 DIAGNOSIS — N183 Chronic kidney disease, stage 3 (moderate): Secondary | ICD-10-CM | POA: Diagnosis not present

## 2018-11-21 DIAGNOSIS — Z794 Long term (current) use of insulin: Secondary | ICD-10-CM | POA: Insufficient documentation

## 2018-11-21 DIAGNOSIS — I10 Essential (primary) hypertension: Secondary | ICD-10-CM | POA: Diagnosis not present

## 2018-11-21 LAB — LIPASE, BLOOD: Lipase: 26 U/L (ref 11–51)

## 2018-11-21 LAB — BASIC METABOLIC PANEL
Anion gap: 7 (ref 5–15)
BUN: 21 mg/dL — ABNORMAL HIGH (ref 6–20)
CALCIUM: 10 mg/dL (ref 8.9–10.3)
CO2: 27 mmol/L (ref 22–32)
Chloride: 102 mmol/L (ref 98–111)
Creatinine, Ser: 2.62 mg/dL — ABNORMAL HIGH (ref 0.44–1.00)
GFR, EST AFRICAN AMERICAN: 27 mL/min — AB (ref >60)
GFR, EST NON AFRICAN AMERICAN: 23 mL/min — AB (ref >60)
Glucose, Bld: 145 mg/dL — ABNORMAL HIGH (ref 70–99)
Potassium: 4.1 mmol/L (ref 3.5–5.1)
Sodium: 136 mmol/L (ref 135–145)

## 2018-11-21 LAB — URINALYSIS, ROUTINE W REFLEX MICROSCOPIC
Bilirubin Urine: NEGATIVE
Glucose, UA: 50 mg/dL — AB
HGB URINE DIPSTICK: NEGATIVE
Ketones, ur: 5 mg/dL — AB
Leukocytes, UA: NEGATIVE
Nitrite: NEGATIVE
Specific Gravity, Urine: 1.012 (ref 1.005–1.030)
pH: 6 (ref 5.0–8.0)

## 2018-11-21 LAB — HEPATIC FUNCTION PANEL
ALT: 11 U/L (ref 0–44)
AST: 15 U/L (ref 15–41)
Albumin: 3.5 g/dL (ref 3.5–5.0)
Alkaline Phosphatase: 64 U/L (ref 38–126)
Bilirubin, Direct: <0.1 mg/dL (ref 0.0–0.2)
Total Bilirubin: 0.7 mg/dL (ref 0.3–1.2)
Total Protein: 6.8 g/dL (ref 6.5–8.1)

## 2018-11-21 LAB — CBC
HCT: 31.7 % — ABNORMAL LOW (ref 36.0–46.0)
Hemoglobin: 9.7 g/dL — ABNORMAL LOW (ref 12.0–15.0)
MCH: 27.6 pg (ref 26.0–34.0)
MCHC: 30.6 g/dL (ref 30.0–36.0)
MCV: 90.1 fL (ref 80.0–100.0)
NRBC: 0 % (ref 0.0–0.2)
PLATELETS: 188 10*3/uL (ref 150–400)
RBC: 3.52 MIL/uL — AB (ref 3.87–5.11)
RDW: 11.8 % (ref 11.5–15.5)
WBC: 4.9 10*3/uL (ref 4.0–10.5)

## 2018-11-21 LAB — I-STAT TROPONIN, ED
TROPONIN I, POC: 0.01 ng/mL (ref 0.00–0.08)
TROPONIN I, POC: 0.01 ng/mL (ref 0.00–0.08)

## 2018-11-21 LAB — I-STAT BETA HCG BLOOD, ED (MC, WL, AP ONLY)

## 2018-11-21 MED ORDER — SODIUM CHLORIDE 0.9 % IV BOLUS
500.0000 mL | Freq: Once | INTRAVENOUS | Status: AC
  Administered 2018-11-21: 500 mL via INTRAVENOUS

## 2018-11-21 MED ORDER — SODIUM CHLORIDE 0.9 % IV BOLUS: 1000.0000 mL | Freq: Once | INTRAVENOUS | Status: DC

## 2018-11-21 MED ORDER — MORPHINE SULFATE (PF) 4 MG/ML IV SOLN
4.0000 mg | Freq: Once | INTRAVENOUS | Status: AC
  Administered 2018-11-21: 4 mg via INTRAVENOUS
  Filled 2018-11-21: qty 1

## 2018-11-21 MED ORDER — ONDANSETRON HCL 4 MG/2ML IJ SOLN: 4.0000 mg | Freq: Once | INTRAMUSCULAR | Status: DC
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
## 2018-11-21 NOTE — ED Triage Notes (Signed)
Pt states that CP started on Monday, central, having a headache, dizziness, some nausea and vomiting the other day and diarrhea.

## 2018-11-22 MED ORDER — HYDROCODONE-ACETAMINOPHEN 5-325 MG PO TABS: 1.0000 | ORAL_TABLET | Freq: Three times a day (TID) | ORAL | 0 refills | Status: AC | PRN

## 2018-11-22 NOTE — Discharge Instructions (Signed)
Thank you for allowing [place] taking care of you at [facility] emergency room. You came in due to chest pain, abdominal pain and nausea. Your heart enzyme x 2, your EKG and your chest X-ray did not show any acute cardiac events. You lab work was reassuring for pancreatitis and acute liver disease. Also no urinary tract infection on your urine test. Your abdominal and pelvis Ct scan showed some kidney stone as well as ovarian cyst. We gave you pain medication and some IV fluid. We are glad that you are doing better. Most of the small stones pass spontaneously. If not improved, please follow up with urologist. Also recommend you to see an OB GYN physician for ovarian cyst. I prescribe you some pain medication to be taken as needed. Please be aware that it can cause drowsiness and you should avoid driving or other activities that need attention, when taking Vicodin.  If not improved in next 2-3 days, or developing vomiting, sever pain, fever...please return to emergency room. Thank you

## 2018-11-26 ENCOUNTER — Encounter: Payer: Self-pay | Admitting: Internal Medicine

## 2018-11-26 ENCOUNTER — Ambulatory Visit: Payer: Medicaid Other | Attending: Internal Medicine | Admitting: Internal Medicine

## 2018-11-26 VITALS — BP 158/102 | HR 95 | Temp 98.7°F | Resp 16 | Wt 135.2 lb

## 2018-11-26 DIAGNOSIS — E1029 Type 1 diabetes mellitus with other diabetic kidney complication: Secondary | ICD-10-CM | POA: Diagnosis not present

## 2018-11-26 DIAGNOSIS — N838 Other noninflammatory disorders of ovary, fallopian tube and broad ligament: Secondary | ICD-10-CM | POA: Diagnosis not present

## 2018-11-26 DIAGNOSIS — F17210 Nicotine dependence, cigarettes, uncomplicated: Secondary | ICD-10-CM | POA: Insufficient documentation

## 2018-11-26 DIAGNOSIS — Z8249 Family history of ischemic heart disease and other diseases of the circulatory system: Secondary | ICD-10-CM | POA: Insufficient documentation

## 2018-11-26 DIAGNOSIS — E1022 Type 1 diabetes mellitus with diabetic chronic kidney disease: Secondary | ICD-10-CM | POA: Diagnosis not present

## 2018-11-26 DIAGNOSIS — Z79899 Other long term (current) drug therapy: Secondary | ICD-10-CM | POA: Insufficient documentation

## 2018-11-26 DIAGNOSIS — Z882 Allergy status to sulfonamides status: Secondary | ICD-10-CM | POA: Diagnosis not present

## 2018-11-26 DIAGNOSIS — I129 Hypertensive chronic kidney disease with stage 1 through stage 4 chronic kidney disease, or unspecified chronic kidney disease: Secondary | ICD-10-CM | POA: Insufficient documentation

## 2018-11-26 DIAGNOSIS — Z888 Allergy status to other drugs, medicaments and biological substances status: Secondary | ICD-10-CM | POA: Insufficient documentation

## 2018-11-26 DIAGNOSIS — Z794 Long term (current) use of insulin: Secondary | ICD-10-CM | POA: Insufficient documentation

## 2018-11-26 DIAGNOSIS — F321 Major depressive disorder, single episode, moderate: Secondary | ICD-10-CM | POA: Insufficient documentation

## 2018-11-26 DIAGNOSIS — Z79890 Hormone replacement therapy (postmenopausal): Secondary | ICD-10-CM | POA: Insufficient documentation

## 2018-11-26 DIAGNOSIS — N184 Chronic kidney disease, stage 4 (severe): Secondary | ICD-10-CM | POA: Diagnosis not present

## 2018-11-26 DIAGNOSIS — I10 Essential (primary) hypertension: Secondary | ICD-10-CM | POA: Diagnosis not present

## 2018-11-26 DIAGNOSIS — Z881 Allergy status to other antibiotic agents status: Secondary | ICD-10-CM | POA: Diagnosis not present

## 2018-11-26 DIAGNOSIS — Z823 Family history of stroke: Secondary | ICD-10-CM | POA: Diagnosis not present

## 2018-11-26 DIAGNOSIS — Z79891 Long term (current) use of opiate analgesic: Secondary | ICD-10-CM | POA: Diagnosis not present

## 2018-11-26 LAB — GLUCOSE, POCT (MANUAL RESULT ENTRY): POC Glucose: 270 mg/dl — AB (ref 70–99)

## 2018-11-26 MED ORDER — HYDRALAZINE HCL 10 MG PO TABS: 10.0000 mg | ORAL_TABLET | Freq: Two times a day (BID) | ORAL | 5 refills | Status: DC

## 2018-11-26 MED ORDER — ERGOCALCIFEROL 1.25 MG (50000 UT) PO CAPS: 50000.0000 [IU] | ORAL_CAPSULE | ORAL | 1 refills | Status: DC

## 2018-11-26 MED ORDER — INSULIN ASPART 100 UNIT/ML ~~LOC~~ SOLN: SUBCUTANEOUS | 6 refills | Status: DC

## 2018-11-26 NOTE — Progress Notes (Signed)
Pt states she is taking tresiba 30 units in the skin every monring

## 2018-11-26 NOTE — Progress Notes (Signed)
Patient ID: Angela [name], female    DOB: September 05, 1986  MRN: [id number]  CC: Diabetes and Hypertension   Subjective: Angela [name] is a 32 y.o. female who presents for chronic disease management and follow-up from recent ED visit.  She has a female friend with her today. Her concerns today include:  Pt with hx of DM type 1 with neuropathy, gastroparesis and nephropathy, HTN, CKD stage 3-4, ACD/IDA, hypothyroid, HL  Patient recently seen in the emergency room for left flank pain.  UA was negative for UTI.  CT of the abdomen revealed incidental finding of small left renal calculi and 6 x 3.6 cm left ovarian cyst or cystic lesion increased in size compared to a prior CT.  Further evaluation with ultrasound was recommended.  Depression: Seen by our PA about 2 weeks ago with complaints of feeling depressed and anxious.  She had stopped taking all of her medications.  She was started on Lexapro which she has found helpful so far.  Patient reports that she is now back on track.  She had an intake with [facility] the [place] and has an appointment with a counselor there next week.   DM type 1: She has established with an endocrinologist through [facility].  She is followed by  PA Radene Gunning.  She is now on an insulin pump with NovoLog.  She is on Tresiba 30 units injection every morning.  She reports that blood wide variation in blood sugars have been much less.  Not as many low blood sugars.  She is learning how to count carbs.  She has follow-up with the endocrinologist again later this month.  CKD/htn: Has follow-up with her nephrologist Dr. Ronnald Ramp tomorrow.  Reports compliance with diltiazem and Norvasc.  She tries to limit salt in the foods.  No chest pains or shortness of breath.  No lower extremity edema. Patient Active Problem List   Diagnosis Date Noted  . Pancytopenia ([place]) 05/03/2018  . Right flank mass 05/03/2018  . Secondary hyperparathyroidism ([place]) 05/01/2018  .  Anemia requiring transfusions 08/24/2017  . Anxiety and depression 06/23/2017  . Insomnia 01/12/2016  . Non compliance with medical treatment 01/05/2016  . Hyperlipidemia 01/05/2016  . Flank pain   . Chronic kidney disease (CKD) stage G3a/A3, moderately decreased glomerular filtration rate (GFR) between 45-59 mL/min/1.73 square meter and albuminuria creatinine ratio greater than 300 mg/g (HCC) 07/28/2015  . Neurogenic bladder 02/20/2015  . Diabetic peripheral neuropathy associated with type 1 diabetes mellitus ([place]) 02/20/2015  . Iron deficiency 02/13/2015  . Asthma 09/30/2014  . At risk for cardiovascular event 09/30/2014  . Diabetic retinopathy ([place]) 09/30/2014  . Hematuria, microscopic 09/30/2014  . Atypical squamous cells of undetermined significance (ASCUS) on Papanicolaou smear of cervix 08/11/2014  . Diabetic gastroparesis associated with type 1 diabetes mellitus ([place]) 08/05/2014  . Anemia in chronic renal disease 08/05/2014  . HTN (hypertension) 07/11/2012  . GERD (gastroesophageal reflux disease) 09/26/2011  . Hypothyroidism 09/14/2006  . 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, with long-term current use of insulin ([place]) 01/15/2000     Current Outpatient Medications on File Prior to Visit  Medication Sig Dispense Refill  . albuterol (PROVENTIL HFA;VENTOLIN HFA) 108 (90 Base) MCG/ACT inhaler Inhale 2 puffs into the lungs every 6 (six) hours as needed for wheezing or shortness of breath. 1 Inhaler 2  . amLODipine (NORVASC) 10 MG tablet Take 1 tablet (10 mg total) by mouth daily. 90 tablet 3  . busPIRone (BUSPAR)  10 MG tablet Take 1 tablet (10 mg total) by mouth 2 (two) times daily. 60 tablet 3  . Cholecalciferol (VITAMIN D3) 1000 units CAPS Take 1,000 Units by mouth daily.     [name] diltiazem (CARTIA XT) 240 MG 24 hr capsule Take 1 capsule (240 mg total) by mouth daily. 30 capsule 3  . escitalopram (LEXAPRO) 20 MG tablet Take 1 tablet (20 mg total) by mouth daily. 30  tablet 3  . furosemide (LASIX) 20 MG tablet Take 1 tablet (20 mg total) by mouth daily as needed for edema. 30 tablet 3  . gabapentin (NEURONTIN) 300 MG capsule TAKE 1 CAPSULE BY MOUTH 2 TIMES DAILY. 60 capsule 0  . hydroxypropyl methylcellulose / hypromellose (ISOPTO TEARS / GONIOVISC) 2.5 % ophthalmic solution Place 1 drop into both eyes 3 (three) times daily as needed for dry eyes.    . Insulin Pen Needle 32G X 4 MM MISC Used to give insulin injections four times daily. 200 each 11  . Insulin Syringe-Needle U-100 (BD INSULIN SYRINGE ULTRAFINE) 31G X 15/64" 1 ML MISC Used to give daily insulin injections. 100 each 11  . levothyroxine (SYNTHROID, LEVOTHROID) 200 MCG tablet TAKE 1 TABLET BY MOUTH DAILY BEFORE BREAKFAST. 30 tablet 2  . metoCLOPramide (REGLAN) 5 MG tablet Take 1 tablet (5 mg total) by mouth 3 (three) times daily before meals. (Patient taking differently: Take 5 mg by mouth 3 (three) times daily as needed for nausea or vomiting. ) 90 tablet 5  . patiromer (VELTASSA) 8.4 g packet Take 8.4 g by mouth daily.     . pravastatin (PRAVACHOL) 20 MG tablet Take 20 mg by mouth daily.    . sodium bicarbonate 650 MG tablet Take 1 tablet (650 mg total) by mouth 2 (two) times daily. 60 tablet 4  . traMADol (ULTRAM) 50 MG tablet Take 1 tablet (50 mg total) by mouth every 6 (six) hours as needed. 12 tablet 0   No current facility-administered medications on file prior to visit.     Allergies  Allergen Reactions  . Ferumoxytol Itching  . Lisinopril Other (See Comments)    hyperkalemia  . Sulfamethoxazole Hives and Itching  . Trimethoprim Hives    Social History   Socioeconomic History  . Marital status: Divorced    Spouse name: Not on file  . Number of children: 0  . Years of education: 10th grade  . Highest education level: Not on file  Occupational History  . Occupation: unemployed    Comment: worked at a group  Social Needs  . Financial resource strain: Not on file  . Food  insecurity:    Worry: Not on file    Inability: Not on file  . Transportation needs:    Medical: Not on file    Non-medical: Not on file  Tobacco Use  . Smoking status: Current Some Day Smoker    Packs/day: 0.25    Years: 2.00    Pack years: 0.50    Types: Cigarettes    Last attempt to quit: 03/04/2013    Years since quitting: 5.7  . Smokeless tobacco: Never Used  Substance and Sexual Activity  . Alcohol use: No    Alcohol/week: 0.0 standard drinks  . Drug use: Yes    Types: Marijuana    Comment: Last used: Yesterday   . Sexual activity: Yes    Birth control/protection: None    Comment: women preference   Lifestyle  . Physical activity:    Days per week:  Not on file    Minutes per session: Not on file  . Stress: Not on file  Relationships  . Social connections:    Talks on phone: Not on file    Gets together: Not on file    Attends religious service: Not on file    Active member of club or organization: Not on file    Attends meetings of clubs or organizations: Not on file    Relationship status: Not on file  . Intimate partner violence:    Fear of current or ex partner: Not on file    Emotionally abused: Not on file    Physically abused: Not on file    Forced sexual activity: Not on file  Other Topics Concern  . Not on file  Social History Narrative   Occupation: currently unemployed   Single   Homosexual,     Lives with mom.    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            Family History  Problem Relation Age of Onset  . Multiple sclerosis Mother   . Hypothyroidism Mother   . Stroke Mother        at age 80 yo  . Hyperlipidemia Maternal  Grandmother   . Hypertension Maternal Grandmother   . Heart disease Maternal Grandmother        unknown type  . Diabetes Maternal Grandmother   . Hypertension Maternal Grandfather   . Prostate cancer Maternal Grandfather   . Diabetes type I Maternal Grandfather   . Breast cancer Paternal Grandmother   . Cancer Neg Hx     Past Surgical History:  Procedure Laterality Date  . FOOT FUSION Right 2006   "put screws in it too" (09/19/2013)    ROS: Review of Systems Negative except as above. PHYSICAL EXAM: BP (!) 158/102 (BP Location: Right Arm, Patient Position: Sitting, Cuff Size: Normal)   Pulse 95   Temp 98.7 F (37.1 C) (Oral)   Resp 16   Wt 135 lb 3.2 oz (61.3 kg)   LMP 11/16/2018 (Exact Date)   SpO2 100%   BMI 22.50 kg/m   Physical Exam  General appearance - alert, well appearing, and in no distress Mental status - normal mood, behavior, speech, dress, motor activity, and thought processes Neck - supple, no significant adenopathy Chest - clear to auscultation, no wheezes, rales or rhonchi, symmetric air entry Heart - normal rate, regular rhythm, normal S1, S2, no murmurs, rubs, clicks or gallops Extremities -no lower extremity edema.  Results for orders placed or performed in visit on 11/26/18  POCT glucose (manual entry)  Result Value Ref Range   POC Glucose 270 (A) 70 - 99 mg/dl   Lab Results  Component Value Date   HGBA1C 9.0 (A) 08/31/2018     ASSESSMENT AND PLAN: 1. 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, with long-term current use of insulin (HCC) Continue follow-up with endocrinology. Encourage healthy eating habits. - POCT glucose (manual entry) - insulin aspart (NOVOLOG) 100 UNIT/ML injection; Use as directed in insulin pump  Dispense: 10 mL; Refill: 6  2.  CKD (chronic kidney disease) stage 4, GFR 15-29 ml/min (HCC) Followed by nephrology.  3. Moderate major depression (HCC) Doing better on Lexapro.  She will receive counseling through  [facility] services [facility] the [place].  4. Ovarian mass, left - Ambulatory referral to Gynecology - US Pelvis Complete; Future  5. Essential hypertension Not at goal.  Add hydralazine.  DASH diet discussed and encouraged. - hydrALAZINE (APRESOLINE) 10 MG tablet; Take 1 tablet (10 mg total) by mouth 2 (two) times daily.  Dispense: 60 tablet; Refill: 5   Patient was given the opportunity to ask questions.  Patient verbalized understanding of the plan and was able to repeat key elements of the plan.   Orders Placed This Encounter  Procedures  . US Pelvis Complete  . Ambulatory referral to Gynecology  . POCT glucose (manual entry)     Requested Prescriptions   Signed Prescriptions Disp Refills  . insulin aspart (NOVOLOG) 100 UNIT/ML injection 10 mL 6    Sig: Use as directed in insulin pump  . ergocalciferol (DRISDOL) 1.25 MG (50000 UT) capsule 9 capsule 1    Sig: Take 1 capsule (50,000 Units total) by mouth once a week.  . hydrALAZINE (APRESOLINE) 10 MG tablet 60 tablet 5    Sig: Take 1 tablet (10 mg total) by mouth 2 (two) times daily.    Return in about 2 months (around 01/27/2019).  Karle Plumber, MD, FACP

## 2018-11-26 NOTE — Patient Instructions (Signed)
Your blood pressure is not controlled.  We have added a medication called hydralazine 10 mg twice a day.  Please let your kidney doctor know that we added this medication.

## 2018-11-27 DIAGNOSIS — E039 Hypothyroidism, unspecified: Secondary | ICD-10-CM | POA: Diagnosis not present

## 2018-11-27 DIAGNOSIS — D509 Iron deficiency anemia, unspecified: Secondary | ICD-10-CM | POA: Diagnosis not present

## 2018-11-27 DIAGNOSIS — E875 Hyperkalemia: Secondary | ICD-10-CM | POA: Diagnosis not present

## 2018-11-27 DIAGNOSIS — E1022 Type 1 diabetes mellitus with diabetic chronic kidney disease: Secondary | ICD-10-CM | POA: Diagnosis not present

## 2018-11-27 DIAGNOSIS — N184 Chronic kidney disease, stage 4 (severe): Secondary | ICD-10-CM | POA: Diagnosis not present

## 2018-11-27 DIAGNOSIS — Z9641 Presence of insulin pump (external) (internal): Secondary | ICD-10-CM | POA: Diagnosis not present

## 2018-11-27 DIAGNOSIS — D631 Anemia in chronic kidney disease: Secondary | ICD-10-CM | POA: Diagnosis not present

## 2018-11-27 DIAGNOSIS — Z87891 Personal history of nicotine dependence: Secondary | ICD-10-CM | POA: Diagnosis not present

## 2018-11-27 DIAGNOSIS — N838 Other noninflammatory disorders of ovary, fallopian tube and broad ligament: Secondary | ICD-10-CM | POA: Insufficient documentation

## 2018-11-27 DIAGNOSIS — K3184 Gastroparesis: Secondary | ICD-10-CM | POA: Diagnosis not present

## 2018-11-27 DIAGNOSIS — E1043 Type 1 diabetes mellitus with diabetic autonomic (poly)neuropathy: Secondary | ICD-10-CM | POA: Diagnosis not present

## 2018-11-27 DIAGNOSIS — F321 Major depressive disorder, single episode, moderate: Secondary | ICD-10-CM | POA: Insufficient documentation

## 2018-11-27 DIAGNOSIS — I129 Hypertensive chronic kidney disease with stage 1 through stage 4 chronic kidney disease, or unspecified chronic kidney disease: Secondary | ICD-10-CM | POA: Diagnosis not present

## 2018-11-27 DIAGNOSIS — R809 Proteinuria, unspecified: Secondary | ICD-10-CM | POA: Diagnosis not present

## 2018-11-27 DIAGNOSIS — Z794 Long term (current) use of insulin: Secondary | ICD-10-CM | POA: Diagnosis not present

## 2018-11-27 DIAGNOSIS — Z79899 Other long term (current) drug therapy: Secondary | ICD-10-CM | POA: Diagnosis not present

## 2018-11-27 DIAGNOSIS — R3129 Other microscopic hematuria: Secondary | ICD-10-CM | POA: Diagnosis not present

## 2018-11-27 DIAGNOSIS — N2581 Secondary hyperparathyroidism of renal origin: Secondary | ICD-10-CM | POA: Diagnosis not present

## 2018-11-28 ENCOUNTER — Other Ambulatory Visit: Payer: Self-pay | Admitting: Physician Assistant

## 2018-11-28 ENCOUNTER — Other Ambulatory Visit: Payer: Self-pay

## 2018-11-28 NOTE — Addendum Note (Signed)
Addended by: Karle Plumber B on: 11/28/2018 09:26 PM   Modules accepted: Orders

## 2018-11-29 ENCOUNTER — Encounter: Payer: Self-pay | Admitting: Internal Medicine

## 2018-11-29 ENCOUNTER — Ambulatory Visit: Payer: Medicaid Other | Attending: Internal Medicine | Admitting: Internal Medicine

## 2018-11-29 VITALS — BP 157/93 | HR 96 | Temp 97.9°F | Resp 16

## 2018-11-29 DIAGNOSIS — I10 Essential (primary) hypertension: Secondary | ICD-10-CM

## 2018-11-29 DIAGNOSIS — N183 Chronic kidney disease, stage 3 (moderate): Secondary | ICD-10-CM | POA: Diagnosis not present

## 2018-11-29 DIAGNOSIS — F329 Major depressive disorder, single episode, unspecified: Secondary | ICD-10-CM | POA: Diagnosis not present

## 2018-11-29 DIAGNOSIS — D61818 Other pancytopenia: Secondary | ICD-10-CM | POA: Insufficient documentation

## 2018-11-29 DIAGNOSIS — E785 Hyperlipidemia, unspecified: Secondary | ICD-10-CM | POA: Insufficient documentation

## 2018-11-29 DIAGNOSIS — Z882 Allergy status to sulfonamides status: Secondary | ICD-10-CM | POA: Diagnosis not present

## 2018-11-29 DIAGNOSIS — Z8249 Family history of ischemic heart disease and other diseases of the circulatory system: Secondary | ICD-10-CM | POA: Diagnosis not present

## 2018-11-29 DIAGNOSIS — N2581 Secondary hyperparathyroidism of renal origin: Secondary | ICD-10-CM | POA: Insufficient documentation

## 2018-11-29 DIAGNOSIS — J069 Acute upper respiratory infection, unspecified: Secondary | ICD-10-CM | POA: Insufficient documentation

## 2018-11-29 DIAGNOSIS — I129 Hypertensive chronic kidney disease with stage 1 through stage 4 chronic kidney disease, or unspecified chronic kidney disease: Secondary | ICD-10-CM | POA: Diagnosis not present

## 2018-11-29 DIAGNOSIS — E1022 Type 1 diabetes mellitus with diabetic chronic kidney disease: Secondary | ICD-10-CM | POA: Insufficient documentation

## 2018-11-29 DIAGNOSIS — Z79890 Hormone replacement therapy (postmenopausal): Secondary | ICD-10-CM | POA: Insufficient documentation

## 2018-11-29 DIAGNOSIS — Z79891 Long term (current) use of opiate analgesic: Secondary | ICD-10-CM | POA: Diagnosis not present

## 2018-11-29 DIAGNOSIS — F419 Anxiety disorder, unspecified: Secondary | ICD-10-CM | POA: Insufficient documentation

## 2018-11-29 DIAGNOSIS — Z79899 Other long term (current) drug therapy: Secondary | ICD-10-CM | POA: Diagnosis not present

## 2018-11-29 DIAGNOSIS — E1043 Type 1 diabetes mellitus with diabetic autonomic (poly)neuropathy: Secondary | ICD-10-CM | POA: Diagnosis not present

## 2018-11-29 DIAGNOSIS — J029 Acute pharyngitis, unspecified: Secondary | ICD-10-CM | POA: Diagnosis present

## 2018-11-29 DIAGNOSIS — F17210 Nicotine dependence, cigarettes, uncomplicated: Secondary | ICD-10-CM | POA: Diagnosis not present

## 2018-11-29 DIAGNOSIS — K3184 Gastroparesis: Secondary | ICD-10-CM | POA: Diagnosis not present

## 2018-11-29 DIAGNOSIS — H9209 Otalgia, unspecified ear: Secondary | ICD-10-CM | POA: Diagnosis present

## 2018-11-29 DIAGNOSIS — R05 Cough: Secondary | ICD-10-CM | POA: Diagnosis present

## 2018-11-29 MED ORDER — HYDRALAZINE HCL 25 MG PO TABS: 25.0000 mg | ORAL_TABLET | Freq: Three times a day (TID) | ORAL | 3 refills | Status: DC

## 2018-11-29 NOTE — Patient Instructions (Signed)
You have a upper respiratory infection which is viral.  This is treated symptomatically.  I recommend Tylenol as needed for aches/pains.  You can gargle with a little warm water and salt for the sore throat.  Use Benadryl as needed for the allergy symptoms.  Follow-up if no improvement.

## 2018-11-29 NOTE — Progress Notes (Signed)
Patient ID: Angela [name], female    DOB: 26-Dec-1985  MRN: [id number]  CC: URI   Subjective: Angela [name] is a 32 y.o. female who presents for UC visit Her concerns today include:   C/O throat pain, BL ear pain, itching of lower eye lids, slight cough, sneezing, SOB and feeling of chest heaviness that started 2 days ago. HA in temples today. Temp yesterday of 99.2.  Had to use Albuterol 3 x yesterday. Normally uses it 2 x a wk. Also using Tylenol PRN.    Blood pressure noted to be elevated today.  She saw her nephrologist Dr. Ronnald Ramp a few days ago.  Hydralazine was increased to 25 mg 3 times a day and lisinopril 10 mg added.  She has not picked up those prescriptions as yet.  Patient Active Problem List   Diagnosis Date Noted  . Ovarian mass, left 11/27/2018  . Moderate major depression ([place]) 11/27/2018  . Pancytopenia ([place]) 05/03/2018  . Right flank mass 05/03/2018  . Secondary hyperparathyroidism ([place]) 05/01/2018  . Anxiety and depression 06/23/2017  . Insomnia 01/12/2016  . Non compliance with medical treatment 01/05/2016  . Hyperlipidemia 01/05/2016  . Flank pain   . Chronic kidney disease (CKD) stage G3a/A3, moderately decreased glomerular filtration rate (GFR) between 45-59 mL/min/1.73 square meter and albuminuria creatinine ratio greater than 300 mg/g (HCC) 07/28/2015  . Neurogenic bladder 02/20/2015  . Diabetic peripheral neuropathy associated with type 1 diabetes mellitus ([place]) 02/20/2015  . Iron deficiency 02/13/2015  . Asthma 09/30/2014  . Diabetic retinopathy ([place]) 09/30/2014  . Atypical squamous cells of undetermined significance (ASCUS) on Papanicolaou smear of cervix 08/11/2014  . Diabetic gastroparesis associated with type 1 diabetes mellitus ([place]) 08/05/2014  . Anemia in chronic renal disease 08/05/2014  . HTN (hypertension) 07/11/2012  . GERD (gastroesophageal reflux disease) 09/26/2011  . Hypothyroidism 09/14/2006  . Type 1 diabetes mellitus with kidney  complication, with long-term current use of insulin ([place]) 01/15/2000     Current Outpatient Medications on File Prior to Visit  Medication Sig Dispense Refill  . lisinopril (PRINIVIL,ZESTRIL) 10 MG tablet Take by mouth.    [name] albuterol (PROVENTIL HFA;VENTOLIN HFA) 108 (90 Base) MCG/ACT inhaler Inhale 2 puffs into the lungs every 6 (six) hours as needed for wheezing or shortness of breath. 1 Inhaler 2  . amLODipine (NORVASC) 10 MG tablet Take 1 tablet (10 mg total) by mouth daily. 90 tablet 3  . busPIRone (BUSPAR) 10 MG tablet Take 1 tablet (10 mg total) by mouth 2 (two) times daily. 60 tablet 3  . Cholecalciferol (VITAMIN D3) 1000 units CAPS Take 1,000 Units by mouth daily.     [name] diltiazem (CARTIA XT) 240 MG 24 hr capsule Take 1 capsule (240 mg total) by mouth daily. 30 capsule 3  . ergocalciferol (DRISDOL) 1.25 MG (50000 UT) capsule Take 1 capsule (50,000 Units total) by mouth once a week. 9 capsule 1  . escitalopram (LEXAPRO) 20 MG tablet Take 1 tablet (20 mg total) by mouth daily. 30 tablet 3  . furosemide (LASIX) 20 MG tablet Take 1 tablet (20 mg total) by mouth daily as needed for edema. 30 tablet 3  . gabapentin (NEURONTIN) 300 MG capsule TAKE 1 CAPSULE BY MOUTH 2 TIMES DAILY. 60 capsule 0  . hydroxypropyl methylcellulose / hypromellose (ISOPTO TEARS / GONIOVISC) 2.5 % ophthalmic solution Place 1 drop into both eyes 3 (three) times daily as needed for dry eyes.    . insulin aspart (NOVOLOG) 100 UNIT/ML injection Use  as directed in insulin pump 10 mL 6  . Insulin Pen Needle 32G X 4 MM MISC Used to give insulin injections four times daily. 200 each 11  . Insulin Syringe-Needle U-100 (BD INSULIN SYRINGE ULTRAFINE) 31G X 15/64" 1 ML MISC Used to give daily insulin injections. 100 each 11  . levothyroxine (SYNTHROID, LEVOTHROID) 200 MCG tablet TAKE 1 TABLET BY MOUTH DAILY BEFORE BREAKFAST. 30 tablet 2  . metoCLOPramide (REGLAN) 5 MG tablet Take 1 tablet (5 mg total) by mouth 3 (three) times daily  before meals. (Patient taking differently: Take 5 mg by mouth 3 (three) times daily as needed for nausea or vomiting. ) 90 tablet 5  . patiromer (VELTASSA) 8.4 g packet Take 8.4 g by mouth daily.     . pravastatin (PRAVACHOL) 20 MG tablet Take 20 mg by mouth daily.    . sodium bicarbonate 650 MG tablet Take 1 tablet (650 mg total) by mouth 2 (two) times daily. 60 tablet 4  . traMADol (ULTRAM) 50 MG tablet Take 1 tablet (50 mg total) by mouth every 6 (six) hours as needed. 12 tablet 0  . TRESIBA FLEXTOUCH 100 UNIT/ML SOPN FlexTouch Pen INJECT 40 UNITS ONCE DAILY WITH ADDITONAL AS NEEDED. MAX DAILY DOSE OF 50 UNITS  3   No current facility-administered medications on file prior to visit.     Allergies  Allergen Reactions  . Ferumoxytol Itching  . Lisinopril Other (See Comments)    hyperkalemia  . Sulfamethoxazole Hives and Itching  . Trimethoprim Hives    Social History   Socioeconomic History  . Marital status: Divorced    Spouse name: Not on file  . Number of children: 0  . Years of education: 10th grade  . Highest education level: Not on file  Occupational History  . Occupation: unemployed    Comment: worked at a group  Social Needs  . Financial resource strain: Not on file  . Food insecurity:    Worry: Not on file    Inability: Not on file  . Transportation needs:    Medical: Not on file    Non-medical: Not on file  Tobacco Use  . Smoking status: Current Some Day Smoker    Packs/day: 0.25    Years: 2.00    Pack years: 0.50    Types: Cigarettes    Last attempt to quit: 03/04/2013    Years since quitting: 5.7  . Smokeless tobacco: Never Used  Substance and Sexual Activity  . Alcohol use: No    Alcohol/week: 0.0 standard drinks  . Drug use: Yes    Types: Marijuana    Comment: Last used: Yesterday   . Sexual activity: Yes    Birth control/protection: None    Comment: women preference   Lifestyle  . Physical activity:    Days per week: Not on file    Minutes per  session: Not on file  . Stress: Not on file  Relationships  . Social connections:    Talks on phone: Not on file    Gets together: Not on file    Attends religious service: Not on file    Active member of club or organization: Not on file    Attends meetings of clubs or organizations: Not on file    Relationship status: Not on file  . Intimate partner violence:    Fear of current or ex partner: Not on file    Emotionally abused: Not on file    Physically abused: Not on  file    Forced sexual activity: Not on file  Other Topics Concern  . Not on file  Social History Narrative   Occupation: currently unemployed   Single   Homosexual,     Lives with mom.    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            Family History  Problem Relation Age of Onset  . Multiple sclerosis Mother   . Hypothyroidism Mother   . Stroke Mother        at age 35 yo  . Hyperlipidemia Maternal Grandmother   . Hypertension Maternal Grandmother   . Heart disease Maternal Grandmother        unknown type  . Diabetes Maternal Grandmother   . Hypertension Maternal Grandfather   . Prostate cancer Maternal Grandfather   . Diabetes type I Maternal Grandfather   . Breast cancer Paternal Grandmother   . Cancer Neg Hx     Past Surgical History:  Procedure Laterality Date  . FOOT FUSION Right 2006   "put screws in it too" (09/19/2013)    ROS: Review of Systems Negative except as stated above PHYSICAL EXAM: BP (!) 157/93   Pulse 96   Temp 97.9 F (36.6 C) (Oral)   Resp 16   LMP 11/16/2018 (Exact Date)   SpO2 100%   Physical Exam  General appearance -patient is in NAD.  She does not  appear toxic Mental status - normal mood, behavior, speech, dress, motor activity, and thought processes Eyes -no conjunctival injection. Ears - bilateral TM's and external ear canals normal Nose -mild enlargement of nasal turbinates.  Clear mucus in the nostrils. Mouth -throat is without erythema or exudates. Neck -neck is supple. Lymphatics -no cervical or preauricular lymphadenopathy Chest - clear to auscultation, no wheezes, rales or rhonchi, symmetric air entry Heart - RRR, 2/6 SEM heard best at Lt lower sternal border with radiation to axilla Neurological - neck supple without rigidity, cranial nerves II through XII intact, motor and sensory grossly normal bilaterally, normal gait   ASSESSMENT AND PLAN:  1. Viral upper respiratory tract infection Recommend symptomatic treatment.  She has a component of allergies for which I recommend using Benadryl as needed.  Advised to gargle with some warm water with salt for the sore throat.  Take Tylenol as needed.  Follow-up if no improvement.  2. Essential hypertension Not at goal.  She has not picked up the increased dose of hydralazine or lisinopril as yet. Med list updated to reflect the addition of lisinopril and increased dose of hydralazine.  Patient to pick up those medicines today. - hydrALAZINE (APRESOLINE) 25 MG tablet; Take 1 tablet (25 mg total) by mouth 3 (three) times daily.  Dispense: 90 tablet; Refill: 3   Patient was given the opportunity to ask questions.  Patient verbalized understanding of the plan and was able to repeat key elements of the plan.   No orders of the defined types were placed in this encounter.    Requested Prescriptions   Signed Prescriptions Disp Refills  . hydrALAZINE (APRESOLINE) 25 MG tablet 90 tablet 3  Sig: Take 1 tablet (25 mg total) by mouth 3 (three) times daily.    Return if symptoms worsen or fail to improve.  Karle Plumber, MD, FACP

## 2018-12-04 ENCOUNTER — Telehealth: Payer: Self-pay | Admitting: Internal Medicine

## 2018-12-04 NOTE — Telephone Encounter (Signed)
Pre authirzation has not been completed. The appointment is friday at 11:30 please call Nira Conn back asap at [phone]

## 2018-12-06 NOTE — Telephone Encounter (Signed)
Went on the [facility] and did prior [place]    [id number] Ultrasound, pelvic (nonobstetric), B-scan and/or real time with image documentation, complete           [id number]  Ultrasound, transvaginal  ICD- N83.8 Other noninflammatory disorders of ovary, fallopian tube and broad ligament  Authorization Number: [id number]   Auth End Date: 01/05/2019   Decision Date: 12/06/2018   Received a staff message from [place] and gave her the authorization number

## 2018-12-07 ENCOUNTER — Ambulatory Visit (HOSPITAL_COMMUNITY)
Admission: RE | Admit: 2018-12-07 | Discharge: 2018-12-07 | Disposition: A | Discharge status: Home / Self Care | Payer: Medicaid Other | Source: Ambulatory Visit | Attending: Internal Medicine | Admitting: Internal Medicine

## 2018-12-07 DIAGNOSIS — N839 Noninflammatory disorder of ovary, fallopian tube and broad ligament, unspecified: Secondary | ICD-10-CM | POA: Diagnosis not present

## 2018-12-07 DIAGNOSIS — N83292 Other ovarian cyst, left side: Secondary | ICD-10-CM | POA: Diagnosis not present

## 2018-12-07 DIAGNOSIS — N838 Other noninflammatory disorders of ovary, fallopian tube and broad ligament: Secondary | ICD-10-CM

## 2018-12-20 ENCOUNTER — Ambulatory Visit: Payer: Medicaid Other | Attending: Internal Medicine | Admitting: Physician Assistant

## 2018-12-20 ENCOUNTER — Encounter: Payer: Self-pay | Admitting: Internal Medicine

## 2018-12-20 VITALS — BP 150/88 | HR 124 | Temp 98.3°F | Ht 65.0 in | Wt 129.0 lb

## 2018-12-20 DIAGNOSIS — Z881 Allergy status to other antibiotic agents status: Secondary | ICD-10-CM | POA: Insufficient documentation

## 2018-12-20 DIAGNOSIS — N183 Chronic kidney disease, stage 3 (moderate): Secondary | ICD-10-CM | POA: Insufficient documentation

## 2018-12-20 DIAGNOSIS — W450XXA Nail entering through skin, initial encounter: Secondary | ICD-10-CM | POA: Insufficient documentation

## 2018-12-20 DIAGNOSIS — E1040 Type 1 diabetes mellitus with diabetic neuropathy, unspecified: Secondary | ICD-10-CM | POA: Insufficient documentation

## 2018-12-20 DIAGNOSIS — S91331A Puncture wound without foreign body, right foot, initial encounter: Secondary | ICD-10-CM | POA: Insufficient documentation

## 2018-12-20 DIAGNOSIS — E039 Hypothyroidism, unspecified: Secondary | ICD-10-CM | POA: Insufficient documentation

## 2018-12-20 DIAGNOSIS — Z79890 Hormone replacement therapy (postmenopausal): Secondary | ICD-10-CM | POA: Diagnosis not present

## 2018-12-20 DIAGNOSIS — S91309A Unspecified open wound, unspecified foot, initial encounter: Secondary | ICD-10-CM

## 2018-12-20 DIAGNOSIS — I129 Hypertensive chronic kidney disease with stage 1 through stage 4 chronic kidney disease, or unspecified chronic kidney disease: Secondary | ICD-10-CM | POA: Insufficient documentation

## 2018-12-20 DIAGNOSIS — Z882 Allergy status to sulfonamides status: Secondary | ICD-10-CM | POA: Insufficient documentation

## 2018-12-20 DIAGNOSIS — Z79899 Other long term (current) drug therapy: Secondary | ICD-10-CM | POA: Diagnosis not present

## 2018-12-20 DIAGNOSIS — Z888 Allergy status to other drugs, medicaments and biological substances status: Secondary | ICD-10-CM | POA: Diagnosis not present

## 2018-12-20 DIAGNOSIS — K219 Gastro-esophageal reflux disease without esophagitis: Secondary | ICD-10-CM | POA: Insufficient documentation

## 2018-12-20 DIAGNOSIS — E785 Hyperlipidemia, unspecified: Secondary | ICD-10-CM | POA: Diagnosis not present

## 2018-12-20 DIAGNOSIS — Z794 Long term (current) use of insulin: Secondary | ICD-10-CM | POA: Diagnosis not present

## 2018-12-20 DIAGNOSIS — G4700 Insomnia, unspecified: Secondary | ICD-10-CM | POA: Insufficient documentation

## 2018-12-20 DIAGNOSIS — Z23 Encounter for immunization: Secondary | ICD-10-CM | POA: Diagnosis not present

## 2018-12-20 DIAGNOSIS — I10 Essential (primary) hypertension: Secondary | ICD-10-CM | POA: Diagnosis not present

## 2018-12-20 DIAGNOSIS — E1022 Type 1 diabetes mellitus with diabetic chronic kidney disease: Secondary | ICD-10-CM | POA: Insufficient documentation

## 2018-12-20 DIAGNOSIS — J45909 Unspecified asthma, uncomplicated: Secondary | ICD-10-CM | POA: Insufficient documentation

## 2018-12-20 DIAGNOSIS — E1029 Type 1 diabetes mellitus with other diabetic kidney complication: Secondary | ICD-10-CM

## 2018-12-20 DIAGNOSIS — E109 Type 1 diabetes mellitus without complications: Secondary | ICD-10-CM | POA: Diagnosis not present

## 2018-12-20 LAB — GLUCOSE, POCT (MANUAL RESULT ENTRY): POC Glucose: 310 mg/dl — AB (ref 70–99)

## 2018-12-20 MED ORDER — CLONIDINE HCL 0.1 MG PO TABS: 0.1000 mg | ORAL_TABLET | Freq: Once | ORAL | Status: DC
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
## 2018-12-20 NOTE — Addendum Note (Signed)
Addended by: Gomez Cleverly on: 12/20/2018 04:24 PM   Modules accepted: Orders

## 2018-12-26 ENCOUNTER — Ambulatory Visit: Payer: Medicaid Other | Admitting: Critical Care Medicine

## 2018-12-28 ENCOUNTER — Encounter (HOSPITAL_COMMUNITY): Payer: Self-pay

## 2018-12-28 ENCOUNTER — Emergency Department (HOSPITAL_COMMUNITY)
Admission: EM | Admit: 2018-12-28 | Discharge: 2018-12-28 | Disposition: A | Discharge status: Home / Self Care | Payer: Medicaid Other | Attending: Emergency Medicine | Admitting: Emergency Medicine

## 2018-12-28 DIAGNOSIS — R05 Cough: Secondary | ICD-10-CM | POA: Diagnosis not present

## 2018-12-28 DIAGNOSIS — Z5321 Procedure and treatment not carried out due to patient leaving prior to being seen by health care provider: Secondary | ICD-10-CM | POA: Insufficient documentation

## 2018-12-28 NOTE — ED Notes (Signed)
Pt not in room.

## 2018-12-28 NOTE — ED Triage Notes (Signed)
Pt complains of chest congestion and a dry cough for two days, she states that her niece has the flu

## 2019-01-02 ENCOUNTER — Encounter: Payer: Self-pay | Admitting: Emergency Medicine

## 2019-01-02 ENCOUNTER — Ambulatory Visit: Payer: Medicaid Other | Attending: Internal Medicine | Admitting: Physician Assistant

## 2019-01-02 ENCOUNTER — Ambulatory Visit
Admission: EM | Admit: 2019-01-02 | Discharge: 2019-01-02 | Disposition: A | Discharge status: Home / Self Care | Payer: Medicaid Other | Attending: Physician Assistant | Admitting: Physician Assistant

## 2019-01-02 VITALS — BP 154/87 | HR 95 | Temp 98.1°F | Resp 16 | Ht 65.0 in | Wt 135.2 lb

## 2019-01-02 DIAGNOSIS — J45909 Unspecified asthma, uncomplicated: Secondary | ICD-10-CM | POA: Diagnosis not present

## 2019-01-02 DIAGNOSIS — Z7901 Long term (current) use of anticoagulants: Secondary | ICD-10-CM | POA: Insufficient documentation

## 2019-01-02 DIAGNOSIS — G4700 Insomnia, unspecified: Secondary | ICD-10-CM | POA: Insufficient documentation

## 2019-01-02 DIAGNOSIS — E785 Hyperlipidemia, unspecified: Secondary | ICD-10-CM | POA: Insufficient documentation

## 2019-01-02 DIAGNOSIS — E1022 Type 1 diabetes mellitus with diabetic chronic kidney disease: Secondary | ICD-10-CM

## 2019-01-02 DIAGNOSIS — E1029 Type 1 diabetes mellitus with other diabetic kidney complication: Secondary | ICD-10-CM | POA: Diagnosis not present

## 2019-01-02 DIAGNOSIS — J0120 Acute ethmoidal sinusitis, unspecified: Secondary | ICD-10-CM | POA: Diagnosis not present

## 2019-01-02 DIAGNOSIS — Z794 Long term (current) use of insulin: Secondary | ICD-10-CM | POA: Insufficient documentation

## 2019-01-02 DIAGNOSIS — Z79899 Other long term (current) drug therapy: Secondary | ICD-10-CM | POA: Diagnosis not present

## 2019-01-02 DIAGNOSIS — I129 Hypertensive chronic kidney disease with stage 1 through stage 4 chronic kidney disease, or unspecified chronic kidney disease: Secondary | ICD-10-CM | POA: Insufficient documentation

## 2019-01-02 DIAGNOSIS — J209 Acute bronchitis, unspecified: Secondary | ICD-10-CM

## 2019-01-02 DIAGNOSIS — J069 Acute upper respiratory infection, unspecified: Secondary | ICD-10-CM

## 2019-01-02 DIAGNOSIS — K219 Gastro-esophageal reflux disease without esophagitis: Secondary | ICD-10-CM | POA: Diagnosis not present

## 2019-01-02 DIAGNOSIS — N183 Chronic kidney disease, stage 3 (moderate): Secondary | ICD-10-CM | POA: Insufficient documentation

## 2019-01-02 DIAGNOSIS — E039 Hypothyroidism, unspecified: Secondary | ICD-10-CM | POA: Diagnosis not present

## 2019-01-02 LAB — GLUCOSE, POCT (MANUAL RESULT ENTRY): POC Glucose: 162 mg/dl — AB (ref 70–99)

## 2019-01-02 MED ORDER — FLUTICASONE-SALMETEROL 250-50 MCG/DOSE IN AEPB: 1.0000 | INHALATION_SPRAY | Freq: Two times a day (BID) | RESPIRATORY_TRACT | 0 refills | Status: DC

## 2019-01-02 MED ORDER — AZITHROMYCIN 250 MG PO TABS: ORAL_TABLET | ORAL | 0 refills | Status: DC

## 2019-01-02 MED ORDER — BENZONATATE 100 MG PO CAPS: 200.0000 mg | ORAL_CAPSULE | Freq: Three times a day (TID) | ORAL | 0 refills | Status: DC

## 2019-01-02 MED ORDER — AMOXICILLIN 500 MG PO CAPS: 500.0000 mg | ORAL_CAPSULE | Freq: Three times a day (TID) | ORAL | 0 refills | Status: DC

## 2019-01-02 MED ORDER — FLUCONAZOLE 150 MG PO TABS: 150.0000 mg | ORAL_TABLET | Freq: Once | ORAL | 0 refills | Status: AC

## 2019-01-02 MED ORDER — BENZONATATE 100 MG PO CAPS: 100.0000 mg | ORAL_CAPSULE | Freq: Three times a day (TID) | ORAL | 0 refills | Status: DC

## 2019-01-02 MED FILL — ADVAIR 250/50 DISKUS: 250-50 | 30 days supply | Qty: 60 | Fill #0

## 2019-01-02 MED FILL — AZITHROMYCIN 250 MG TABLET: 250 | 5 days supply | Qty: 6 | Fill #0

## 2019-01-02 MED FILL — BENZONATATE 100 MG CAP: 100 | 7 days supply | Qty: 40 | Fill #0

## 2019-01-02 MED FILL — LEVOTHYROXINE 200 MCG TAB: 200 | 30 days supply | Qty: 30 | Fill #1

## 2019-01-02 MED FILL — FLUCONAZOLE 150 MG TABS: 150 | 1 days supply | Qty: 1 | Fill #0
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
## 2019-01-02 NOTE — ED Notes (Signed)
Pt able to ambulate independently at discharge until she got to the lobby.  Patient then c/o feeling dizzy and had to be accompanied by friend arm in arm to car.  This RN confirmed patient made it safely, no falls, to vehicle for d/c.

## 2019-01-02 NOTE — Discharge Instructions (Signed)
See your Physician for recheck in 1 week  °

## 2019-01-02 NOTE — ED Triage Notes (Signed)
Pt presents to [facility] for assessment of cough, congestion, sore throat, headaches, dizziness x 2 weeks.  HX of CKDIV and HTN.
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
## 2019-01-03 ENCOUNTER — Ambulatory Visit: Payer: Medicaid Other | Admitting: Obstetrics and Gynecology

## 2019-01-03 ENCOUNTER — Ambulatory Visit: Payer: Self-pay | Admitting: Internal Medicine

## 2019-01-03 ENCOUNTER — Ambulatory Visit: Payer: Self-pay

## 2019-01-03 ENCOUNTER — Encounter: Payer: Self-pay | Admitting: Obstetrics and Gynecology

## 2019-01-03 VITALS — BP 129/84 | HR 112 | Ht 61.0 in | Wt 136.0 lb

## 2019-01-03 DIAGNOSIS — N83202 Unspecified ovarian cyst, left side: Secondary | ICD-10-CM

## 2019-01-03 DIAGNOSIS — Z30430 Encounter for insertion of intrauterine contraceptive device: Secondary | ICD-10-CM | POA: Diagnosis not present

## 2019-01-03 LAB — POCT URINE PREGNANCY: Preg Test, Ur: NEGATIVE

## 2019-01-03 MED ORDER — LEVONORGESTREL 20 MCG/24HR IU IUD
INTRAUTERINE_SYSTEM | Freq: Once | INTRAUTERINE | Status: AC
  Administered 2019-01-03: 10:00:00 via INTRAUTERINE

## 2019-01-03 NOTE — Addendum Note (Signed)
Addended by: Lewie Loron D on: 01/03/2019 10:08 AM   Modules accepted: Orders
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
## 2019-01-11 ENCOUNTER — Ambulatory Visit: Payer: Self-pay | Admitting: Podiatry

## 2019-01-15 ENCOUNTER — Encounter: Payer: Self-pay | Admitting: Obstetrics & Gynecology

## 2019-01-15 ENCOUNTER — Ambulatory Visit: Payer: Medicaid Other | Admitting: Obstetrics & Gynecology

## 2019-01-15 VITALS — BP 132/80 | HR 89 | Ht 65.0 in | Wt 136.9 lb

## 2019-01-15 DIAGNOSIS — Z30431 Encounter for routine checking of intrauterine contraceptive device: Secondary | ICD-10-CM | POA: Diagnosis not present

## 2019-01-15 NOTE — Progress Notes (Signed)
Pt presents for IUD removal c/o spotting and fatigued. She does not desire contraception at this time.

## 2019-01-15 NOTE — Progress Notes (Signed)
History:  33 y.o. G0P0000 here today for today for IUD check; Liletta IUD was placed 01/03/2019. Pt reports that she has had bleeding and weakness since that time and wants the IUD removed. She reports that her nephrologist asked her to get it to control her bleeding. She reports only mild pain. She does say that she was told that she could have bleeding.  The following portions of the patient's history were reviewed and updated as appropriate: allergies, current medications, past family history, past medical history, past social history, past surgical history and problem list.  Review of Systems:  Pertinent items are noted in HPI.   Objective:  BP 132/80   Pulse 89   Ht 5\' 5"  (1.651 m)   Wt 136 lb 14.4 oz (62.1 kg)   LMP 12/24/2018   BMI 22.78 kg/m   CONSTITUTIONAL: Well-developed, well-nourished female in no acute distress.  HENT:  Normocephalic, atraumatic EYES: Conjunctivae and EOM are normal. No scleral icterus.  NECK: Normal range of motion SKIN: Skin is warm and dry. No rash noted. Not diaphoretic.No pallor. [place]: Alert and oriented to person, place, and time. Normal coordination.   Assessment & Plan:  I had a long discussion with pt about the side effects of the LnIUD and what her long term goals are. After a lengthy discussion with goals reviewed pt agrees to keep the LnIUD and reeval in 3 months. All of her questions were answered.    Total face-to-face time with patient was 20 min.  Greater than 50% was spent in counseling and coordination of care with the patient.   Jehan Bonano L. Harraway-Smith, M.D., [place]nn June

## 2019-01-21 DIAGNOSIS — E109 Type 1 diabetes mellitus without complications: Secondary | ICD-10-CM | POA: Diagnosis not present

## 2019-01-22 ENCOUNTER — Ambulatory Visit (INDEPENDENT_AMBULATORY_CARE_PROVIDER_SITE_OTHER): Payer: Medicaid Other

## 2019-01-22 ENCOUNTER — Encounter (HOSPITAL_COMMUNITY): Payer: Self-pay | Admitting: Emergency Medicine

## 2019-01-22 ENCOUNTER — Ambulatory Visit (HOSPITAL_COMMUNITY)
Admission: EM | Admit: 2019-01-22 | Discharge: 2019-01-22 | Disposition: A | Discharge status: Home / Self Care | Payer: Medicaid Other | Attending: Family Medicine | Admitting: Family Medicine

## 2019-01-22 DIAGNOSIS — J111 Influenza due to unidentified influenza virus with other respiratory manifestations: Secondary | ICD-10-CM

## 2019-01-22 DIAGNOSIS — R69 Illness, unspecified: Secondary | ICD-10-CM | POA: Diagnosis not present

## 2019-01-22 DIAGNOSIS — R05 Cough: Secondary | ICD-10-CM | POA: Diagnosis not present

## 2019-01-22 MED ORDER — HYDROCODONE-HOMATROPINE 5-1.5 MG/5ML PO SYRP: 5.0000 mL | ORAL_SOLUTION | Freq: Every evening | ORAL | 0 refills | Status: DC | PRN

## 2019-01-22 MED ORDER — PREDNISONE 20 MG PO TABS: 20.0000 mg | ORAL_TABLET | Freq: Two times a day (BID) | ORAL | 0 refills | Status: AC

## 2019-01-22 NOTE — Discharge Instructions (Signed)
No pneumonia on chest x-ray Most likely you are still getting over the flu Please continue to drink plenty of fluids Tylenol as needed for fevers and body aches Begin prednisone 20 mg twice a day for the next 4 days to help with inflammation in chest Continue inhalers as prescribed May use Hycodan cough syrup for cough at nighttime. May try Mucinex, Flonase versus Zyrtec/Claritin to help with congestion/drainage  Please follow-up if symptoms continue to persist, worsening, developing fever, persistent symptoms

## 2019-01-22 NOTE — ED Triage Notes (Signed)
Pt c/o flu like symptoms x1 week, states last week she felt bad and was given a z pack and felt better, then felt worse again. Fever, body aches, chills, cough. Fever of 102 at home. Took tylenol
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
## 2019-01-24 ENCOUNTER — Encounter: Payer: Self-pay | Admitting: Podiatry

## 2019-01-24 ENCOUNTER — Ambulatory Visit: Payer: Self-pay | Admitting: Podiatry

## 2019-01-29 ENCOUNTER — Encounter: Payer: Self-pay | Admitting: Internal Medicine

## 2019-01-29 ENCOUNTER — Ambulatory Visit: Payer: Medicaid Other | Attending: Internal Medicine | Admitting: Internal Medicine

## 2019-01-29 VITALS — BP 149/96 | HR 96 | Temp 98.3°F | Resp 16 | Ht 65.0 in | Wt 146.4 lb

## 2019-01-29 DIAGNOSIS — N83202 Unspecified ovarian cyst, left side: Secondary | ICD-10-CM | POA: Diagnosis not present

## 2019-01-29 DIAGNOSIS — I10 Essential (primary) hypertension: Secondary | ICD-10-CM

## 2019-01-29 DIAGNOSIS — Z794 Long term (current) use of insulin: Secondary | ICD-10-CM | POA: Insufficient documentation

## 2019-01-29 DIAGNOSIS — N184 Chronic kidney disease, stage 4 (severe): Secondary | ICD-10-CM | POA: Insufficient documentation

## 2019-01-29 DIAGNOSIS — E785 Hyperlipidemia, unspecified: Secondary | ICD-10-CM | POA: Diagnosis not present

## 2019-01-29 DIAGNOSIS — Z9641 Presence of insulin pump (external) (internal): Secondary | ICD-10-CM | POA: Diagnosis not present

## 2019-01-29 DIAGNOSIS — J069 Acute upper respiratory infection, unspecified: Secondary | ICD-10-CM | POA: Diagnosis not present

## 2019-01-29 DIAGNOSIS — F329 Major depressive disorder, single episode, unspecified: Secondary | ICD-10-CM | POA: Insufficient documentation

## 2019-01-29 DIAGNOSIS — E1022 Type 1 diabetes mellitus with diabetic chronic kidney disease: Secondary | ICD-10-CM | POA: Insufficient documentation

## 2019-01-29 DIAGNOSIS — Z87891 Personal history of nicotine dependence: Secondary | ICD-10-CM | POA: Diagnosis not present

## 2019-01-29 DIAGNOSIS — Z7901 Long term (current) use of anticoagulants: Secondary | ICD-10-CM | POA: Insufficient documentation

## 2019-01-29 DIAGNOSIS — K3184 Gastroparesis: Secondary | ICD-10-CM | POA: Diagnosis not present

## 2019-01-29 DIAGNOSIS — I129 Hypertensive chronic kidney disease with stage 1 through stage 4 chronic kidney disease, or unspecified chronic kidney disease: Secondary | ICD-10-CM | POA: Insufficient documentation

## 2019-01-29 DIAGNOSIS — N920 Excessive and frequent menstruation with regular cycle: Secondary | ICD-10-CM | POA: Diagnosis not present

## 2019-01-29 DIAGNOSIS — E1043 Type 1 diabetes mellitus with diabetic autonomic (poly)neuropathy: Secondary | ICD-10-CM | POA: Insufficient documentation

## 2019-01-29 DIAGNOSIS — Z79899 Other long term (current) drug therapy: Secondary | ICD-10-CM | POA: Diagnosis not present

## 2019-01-29 DIAGNOSIS — B9789 Other viral agents as the cause of diseases classified elsewhere: Secondary | ICD-10-CM

## 2019-01-29 DIAGNOSIS — K219 Gastro-esophageal reflux disease without esophagitis: Secondary | ICD-10-CM | POA: Insufficient documentation

## 2019-01-29 DIAGNOSIS — E1029 Type 1 diabetes mellitus with other diabetic kidney complication: Secondary | ICD-10-CM | POA: Diagnosis not present

## 2019-01-29 DIAGNOSIS — R011 Cardiac murmur, unspecified: Secondary | ICD-10-CM | POA: Diagnosis not present

## 2019-01-29 DIAGNOSIS — D631 Anemia in chronic kidney disease: Secondary | ICD-10-CM | POA: Diagnosis not present

## 2019-01-29 LAB — GLUCOSE, POCT (MANUAL RESULT ENTRY)
POC Glucose: 114 mg/dl — AB (ref 70–99)
POC Glucose: 103 mg/dl — AB (ref 70–99)
POC Glucose: 35 mg/dl — AB (ref 70–99)

## 2019-01-29 MED ORDER — HYDROCODONE-HOMATROPINE 5-1.5 MG/5ML PO SYRP: 5.0000 mL | ORAL_SOLUTION | Freq: Every evening | ORAL | 0 refills | Status: DC | PRN

## 2019-01-29 NOTE — Progress Notes (Signed)
Pt states her whole body is in pain
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
## 2019-01-29 NOTE — Patient Instructions (Signed)
Cough can linger for several weeks after an upper respiratory infection.  I have given a refill on the cough syrup for you to use as needed.

## 2019-01-30 LAB — HEMOGLOBIN A1C
Est. average glucose Bld gHb Est-mCnc: 217 mg/dL
Hgb A1c MFr Bld: 9.2 % — ABNORMAL HIGH (ref 4.8–5.6)

## 2019-01-31 ENCOUNTER — Ambulatory Visit: Payer: Self-pay | Admitting: Obstetrics and Gynecology

## 2019-01-31 ENCOUNTER — Telehealth: Payer: Self-pay

## 2019-01-31 NOTE — Telephone Encounter (Signed)
Contacted pharmacy to call in cough syrup for pt per Dr. Wynetta Emery.   Contacted the pharmacy and spoke to the pharmacist she stated that pt picked up a refill on the cough on Feb 4 and it is a 14 day supply so pt she be out the 18th of Feb. Per Dr. Wynetta Emery pt can get new rx filled on the 17th of Feb.   Contacted pt and made aware of the changes. Pt doesn't have have any questions or concerns

## 2019-02-07 MED FILL — LEVOTHYROXINE 200 MCG TAB: 200 | 30 days supply | Qty: 30 | Fill #2

## 2019-02-11 ENCOUNTER — Ambulatory Visit: Payer: Medicaid Other | Admitting: Obstetrics and Gynecology

## 2019-02-11 ENCOUNTER — Encounter: Payer: Self-pay | Admitting: Obstetrics and Gynecology

## 2019-02-11 ENCOUNTER — Other Ambulatory Visit (HOSPITAL_COMMUNITY)
Admission: RE | Admit: 2019-02-11 | Discharge: 2019-02-11 | Disposition: A | Discharge status: Home / Self Care | Payer: Medicaid Other | Source: Ambulatory Visit | Attending: Obstetrics and Gynecology | Admitting: Obstetrics and Gynecology

## 2019-02-11 VITALS — BP 160/93 | HR 103 | Wt 141.0 lb

## 2019-02-11 DIAGNOSIS — Z30431 Encounter for routine checking of intrauterine contraceptive device: Secondary | ICD-10-CM

## 2019-02-11 DIAGNOSIS — Z113 Encounter for screening for infections with a predominantly sexual mode of transmission: Secondary | ICD-10-CM | POA: Diagnosis not present
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
## 2019-02-13 LAB — CERVICOVAGINAL ANCILLARY ONLY
CHLAMYDIA, DNA PROBE: NEGATIVE
Neisseria Gonorrhea: NEGATIVE

## 2019-02-13 LAB — HEPATITIS B SURFACE ANTIGEN: Hepatitis B Surface Ag: NEGATIVE

## 2019-02-13 LAB — RPR: RPR Ser Ql: NONREACTIVE

## 2019-02-13 LAB — HSV(HERPES SMPLX)ABS-I+II(IGG+IGM)-BLD
HSV 1 Glycoprotein G Ab, IgG: <0.91 index (ref 0.00–0.90)
HSV 2 IgG, Type Spec: <0.91 index (ref 0.00–0.90)
HSVI/II Comb IgM: 1.36 Ratio — ABNORMAL HIGH (ref 0.00–0.90)

## 2019-02-13 LAB — HEPATITIS C ANTIBODY: Hep C Virus Ab: <0.1 s/co ratio (ref 0.0–0.9)

## 2019-02-13 LAB — HIV ANTIBODY (ROUTINE TESTING W REFLEX): HIV Screen 4th Generation wRfx: NONREACTIVE

## 2019-02-15 ENCOUNTER — Encounter: Payer: Self-pay | Admitting: Internal Medicine

## 2019-02-15 ENCOUNTER — Ambulatory Visit: Payer: Medicaid Other | Attending: Internal Medicine | Admitting: Internal Medicine

## 2019-02-15 VITALS — BP 165/102 | HR 96 | Temp 98.1°F | Resp 16 | Ht 65.0 in | Wt 142.6 lb

## 2019-02-15 DIAGNOSIS — R0981 Nasal congestion: Secondary | ICD-10-CM

## 2019-02-15 DIAGNOSIS — K219 Gastro-esophageal reflux disease without esophagitis: Secondary | ICD-10-CM | POA: Insufficient documentation

## 2019-02-15 DIAGNOSIS — N319 Neuromuscular dysfunction of bladder, unspecified: Secondary | ICD-10-CM | POA: Diagnosis not present

## 2019-02-15 DIAGNOSIS — Z881 Allergy status to other antibiotic agents status: Secondary | ICD-10-CM | POA: Insufficient documentation

## 2019-02-15 DIAGNOSIS — E785 Hyperlipidemia, unspecified: Secondary | ICD-10-CM | POA: Diagnosis not present

## 2019-02-15 DIAGNOSIS — Z888 Allergy status to other drugs, medicaments and biological substances status: Secondary | ICD-10-CM | POA: Insufficient documentation

## 2019-02-15 DIAGNOSIS — Z79890 Hormone replacement therapy (postmenopausal): Secondary | ICD-10-CM | POA: Diagnosis not present

## 2019-02-15 DIAGNOSIS — G4700 Insomnia, unspecified: Secondary | ICD-10-CM | POA: Diagnosis not present

## 2019-02-15 DIAGNOSIS — N2581 Secondary hyperparathyroidism of renal origin: Secondary | ICD-10-CM | POA: Insufficient documentation

## 2019-02-15 DIAGNOSIS — I10 Essential (primary) hypertension: Secondary | ICD-10-CM | POA: Diagnosis not present

## 2019-02-15 DIAGNOSIS — E1043 Type 1 diabetes mellitus with diabetic autonomic (poly)neuropathy: Secondary | ICD-10-CM | POA: Insufficient documentation

## 2019-02-15 DIAGNOSIS — I129 Hypertensive chronic kidney disease with stage 1 through stage 4 chronic kidney disease, or unspecified chronic kidney disease: Secondary | ICD-10-CM | POA: Insufficient documentation

## 2019-02-15 DIAGNOSIS — E039 Hypothyroidism, unspecified: Secondary | ICD-10-CM | POA: Insufficient documentation

## 2019-02-15 DIAGNOSIS — Z79899 Other long term (current) drug therapy: Secondary | ICD-10-CM | POA: Diagnosis not present

## 2019-02-15 DIAGNOSIS — F419 Anxiety disorder, unspecified: Secondary | ICD-10-CM | POA: Insufficient documentation

## 2019-02-15 DIAGNOSIS — J00 Acute nasopharyngitis [common cold]: Secondary | ICD-10-CM

## 2019-02-15 DIAGNOSIS — Z882 Allergy status to sulfonamides status: Secondary | ICD-10-CM | POA: Diagnosis not present

## 2019-02-15 DIAGNOSIS — H6981 Other specified disorders of Eustachian tube, right ear: Secondary | ICD-10-CM | POA: Insufficient documentation

## 2019-02-15 DIAGNOSIS — Z794 Long term (current) use of insulin: Secondary | ICD-10-CM | POA: Insufficient documentation

## 2019-02-15 DIAGNOSIS — E1022 Type 1 diabetes mellitus with diabetic chronic kidney disease: Secondary | ICD-10-CM | POA: Diagnosis not present

## 2019-02-15 DIAGNOSIS — N184 Chronic kidney disease, stage 4 (severe): Secondary | ICD-10-CM | POA: Diagnosis not present

## 2019-02-15 DIAGNOSIS — D631 Anemia in chronic kidney disease: Secondary | ICD-10-CM | POA: Insufficient documentation

## 2019-02-15 DIAGNOSIS — F329 Major depressive disorder, single episode, unspecified: Secondary | ICD-10-CM | POA: Diagnosis not present

## 2019-02-15 MED ORDER — HYDRALAZINE HCL 50 MG PO TABS: 50.0000 mg | ORAL_TABLET | Freq: Three times a day (TID) | ORAL | 3 refills | Status: DC

## 2019-02-15 MED ORDER — FLUTICASONE PROPIONATE 50 MCG/ACT NA SUSP: 2.0000 | Freq: Every day | NASAL | 0 refills | Status: DC

## 2019-02-15 MED ORDER — LORATADINE 10 MG PO TABS: ORAL_TABLET | ORAL | 0 refills | Status: DC

## 2019-02-15 NOTE — Progress Notes (Signed)
Pt symptoms are  -coughing w/yellow mucous -ear pain(right)   Pt states she has thick white mucous come out her nose

## 2019-02-15 NOTE — Patient Instructions (Signed)
Use the Flonase nasal nasal spray daily for 1 week and then after that as needed.  Use the Claritin every 24-48 hours as needed for the sinus congestion.  Your blood pressure is not at goal of 130/80 or lower.  I recommend increasing the hydralazine to 50 mg 3 times a day.  I have sent an updated prescription to your pharmacy.

## 2019-02-15 NOTE — Progress Notes (Signed)
Patient ID: Angela [name], female    DOB: 1986/10/07  MRN: [id number]  CC:    Subjective:  Angela [name] is a 33 y.o. female who presents for UC visit.  Her mother is with her Her concerns today include:  Pt with hx of DM type 1 with neuropathy, gastroparesis and nephropathy, HTN, CKD stage 3-4, ACD/IDA, hypothyroid, HL  Patient thinks she may have a sinus infection.  She complains of nasal congestion, rhinorrhea and sneezing x4 days.  She has had thick white mucus coming out of the nose.  Hard time breathing through the nose.  Also reports pain in RT ear constant x 1 wk No fever or facial pain  Blood pressure noted to be elevated today.  She reports compliance with blood pressure medications.  She has taken medicines already for today.  Patient Active Problem List   Diagnosis Date Noted  . Ovarian mass, left 11/27/2018  . Moderate major depression ([place]) 11/27/2018  . Cardiac murmur 09/09/2018  . Right flank mass 05/03/2018  . Secondary hyperparathyroidism ([place]) 05/01/2018  . Anxiety and depression 06/23/2017  . Insomnia 01/12/2016  . Non compliance with medical treatment 01/05/2016  . Hyperlipidemia 01/05/2016  . Chronic kidney disease (CKD) stage G3a/A3, moderately decreased glomerular filtration rate (GFR) between 45-59 mL/min/1.73 square meter and albuminuria creatinine ratio greater than 300 mg/g (HCC) 07/28/2015  . Neurogenic bladder 02/20/2015  . Diabetic peripheral neuropathy associated with type 1 diabetes mellitus ([place]) 02/20/2015  . Iron deficiency 02/13/2015  . Asthma 09/30/2014  . Diabetic retinopathy ([place]) 09/30/2014  . Atypical squamous cells of undetermined significance (ASCUS) on Papanicolaou smear of cervix 08/11/2014  . Diabetic gastroparesis associated with type 1 diabetes mellitus ([place]) 08/05/2014  . Anemia in chronic renal disease 08/05/2014  . HTN (hypertension) 07/11/2012  . GERD (gastroesophageal reflux disease) 09/26/2011  . Hypothyroidism 09/14/2006   . 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, with long-term current use of insulin ([place]) 01/15/2000     Current Outpatient Medications on File Prior to Visit  Medication Sig Dispense Refill  . albuterol (PROVENTIL HFA;VENTOLIN HFA) 108 (90 Base) MCG/ACT inhaler Inhale 2 puffs into the lungs every 6 (six) hours as needed for wheezing or shortness of breath. 1 Inhaler 2  . amLODipine (NORVASC) 10 MG tablet Take 1 tablet (10 mg total) by mouth daily. 90 tablet 3  . busPIRone (BUSPAR) 10 MG tablet Take 1 tablet (10 mg total) by mouth 2 (two) times daily. 60 tablet 3  . Cholecalciferol (VITAMIN D3) 1000 units CAPS Take 1,000 Units by mouth daily.     [name] diltiazem (CARTIA XT) 240 MG 24 hr capsule Take 1 capsule (240 mg total) by mouth daily. 30 capsule 3  . ergocalciferol (DRISDOL) 1.25 MG (50000 UT) capsule Take 1 capsule (50,000 Units total) by mouth once a week. 9 capsule 1  . escitalopram (LEXAPRO) 20 MG tablet Take 1 tablet (20 mg total) by mouth daily. 30 tablet 3  . Fluticasone-Salmeterol (ADVAIR) 250-50 MCG/DOSE AEPB Inhale 1 puff into the lungs 2 (two) times daily. 60 each 0  . furosemide (LASIX) 20 MG tablet Take 1 tablet (20 mg total) by mouth daily as needed for edema. 30 tablet 3  . gabapentin (NEURONTIN) 300 MG capsule TAKE 1 CAPSULE BY MOUTH 2 TIMES DAILY. 60 capsule 0  . HYDROcodone-homatropine (HYCODAN) 5-1.5 MG/5ML syrup Take 5 mLs by mouth at bedtime as needed for cough. 80 mL 0  . hydroxypropyl methylcellulose / hypromellose (ISOPTO TEARS / GONIOVISC) 2.5 %  ophthalmic solution Place 1 drop into both eyes 3 (three) times daily as needed for dry eyes.    . insulin aspart (NOVOLOG) 100 UNIT/ML injection Use as directed in insulin pump 10 mL 6  . Insulin Pen Needle 32G X 4 MM MISC Used to give insulin injections four times daily. 200 each 11  . Insulin Syringe-Needle U-100 (BD INSULIN SYRINGE ULTRAFINE) 31G X 15/64" 1 ML MISC Used to give daily insulin injections. 100 each 11    . levothyroxine (SYNTHROID, LEVOTHROID) 200 MCG tablet TAKE 1 TABLET BY MOUTH DAILY BEFORE BREAKFAST. 30 tablet 2  . lisinopril (PRINIVIL,ZESTRIL) 10 MG tablet Take by mouth.    . metoCLOPramide (REGLAN) 5 MG tablet Take 1 tablet (5 mg total) by mouth 3 (three) times daily before meals. (Patient taking differently: Take 5 mg by mouth 3 (three) times daily as needed for nausea or vomiting. ) 90 tablet 5  . patiromer (VELTASSA) 8.4 g packet Take 8.4 g by mouth daily.     . pravastatin (PRAVACHOL) 20 MG tablet Take 20 mg by mouth daily.    . sodium bicarbonate 650 MG tablet Take 1 tablet (650 mg total) by mouth 2 (two) times daily. 60 tablet 4  . TRESIBA FLEXTOUCH 100 UNIT/ML SOPN FlexTouch Pen INJECT 40 UNITS ONCE DAILY WITH ADDITONAL AS NEEDED. MAX DAILY DOSE OF 50 UNITS  3   No current facility-administered medications on file prior to visit.     Allergies  Allergen Reactions  . Ferumoxytol Itching  . Lisinopril Other (See Comments)    hyperkalemia  . Sulfamethoxazole Hives and Itching  . Trimethoprim Hives     ROS: Review of Systems Negative except as above.  PHYSICAL EXAM: BP (!) 165/102   Pulse 96   Temp 98.1 F (36.7 C) (Oral)   Resp 16   Ht 5\' 5"  (1.651 m)   Wt 142 lb 9.6 oz (64.7 kg)   SpO2 100%   BMI 23.73 kg/m   BP 170/88 Physical Exam  General appearance - alert, well appearing, and in no distress Mental status - normal mood, behavior, speech, dress, motor activity, and thought processes Ears - bilateral TM's and external ear canals normal.  No fluid buildup behind the tympanic membrane Nose -mild enlargement of nasal turbinates. nasal mucosa is dry Mouth - mucous membranes moist, pharynx normal without lesions Neck -no cervical or preauricular lymphadenopathy  ASSESSMENT AND PLAN: 1. Sinus congestion 2. Head cold 3. Dysfunction of right eustachian tube No need for antibiotics.  We will have her use Flonase for about a week.  Beyond that patient told to  use as needed as daily use can cause nosebleeds.  We will also give her a short course of Claritin to help with the sinus congestion - fluticasone (FLONASE) 50 MCG/ACT nasal spray; Place 2 sprays into both nostrils daily.  Dispense: 16 g; Refill: 0 - loratadine (CLARITIN) 10 MG tablet; 1 tab every 24-48 hours PRN  Dispense: 15 tablet; Refill: 0  4. Essential hypertension Not at goal.  Increase hydralazine to 50 mg 3 times a day. - hydrALAZINE (APRESOLINE) 50 MG tablet; Take 1 tablet (50 mg total) by mouth 3 (three) times daily.  Dispense: 90 tablet; Refill: 3  Patient was given the opportunity to ask questions.  Patient verbalized understanding of the plan and was able to repeat key elements of the plan.   No orders of the defined types were placed in this encounter.    Requested Prescriptions   Signed  Prescriptions Disp Refills  . fluticasone (FLONASE) 50 MCG/ACT nasal spray 16 g 0    Sig: Place 2 sprays into both nostrils daily.  . hydrALAZINE (APRESOLINE) 50 MG tablet 90 tablet 3    Sig: Take 1 tablet (50 mg total) by mouth 3 (three) times daily.  [name] loratadine (CLARITIN) 10 MG tablet 15 tablet 0    Sig: 1 tab every 24-48 hours PRN    Future Appointments  Date Time Provider [place]  04/30/2019  3:50 PM [name], MD [id number] None    Karle Plumber, MD, FACP

## 2019-02-18 DIAGNOSIS — E109 Type 1 diabetes mellitus without complications: Secondary | ICD-10-CM | POA: Diagnosis not present

## 2019-02-28 ENCOUNTER — Telehealth: Payer: Self-pay | Admitting: Internal Medicine

## 2019-02-28 DIAGNOSIS — F32A Depression, unspecified: Secondary | ICD-10-CM

## 2019-02-28 DIAGNOSIS — R0981 Nasal congestion: Secondary | ICD-10-CM

## 2019-02-28 DIAGNOSIS — E1043 Type 1 diabetes mellitus with diabetic autonomic (poly)neuropathy: Secondary | ICD-10-CM

## 2019-02-28 DIAGNOSIS — K3184 Gastroparesis: Principal | ICD-10-CM

## 2019-02-28 DIAGNOSIS — J069 Acute upper respiratory infection, unspecified: Secondary | ICD-10-CM

## 2019-02-28 DIAGNOSIS — I10 Essential (primary) hypertension: Secondary | ICD-10-CM

## 2019-02-28 DIAGNOSIS — F329 Major depressive disorder, single episode, unspecified: Secondary | ICD-10-CM

## 2019-02-28 DIAGNOSIS — E1042 Type 1 diabetes mellitus with diabetic polyneuropathy: Secondary | ICD-10-CM

## 2019-02-28 DIAGNOSIS — E039 Hypothyroidism, unspecified: Secondary | ICD-10-CM

## 2019-02-28 DIAGNOSIS — F419 Anxiety disorder, unspecified: Secondary | ICD-10-CM

## 2019-02-28 DIAGNOSIS — R600 Localized edema: Secondary | ICD-10-CM

## 2019-02-28 MED ORDER — ESCITALOPRAM OXALATE 20 MG PO TABS: 20.0000 mg | ORAL_TABLET | Freq: Every day | ORAL | 3 refills | Status: DC

## 2019-02-28 MED ORDER — AMLODIPINE BESYLATE 10 MG PO TABS: 10.0000 mg | ORAL_TABLET | Freq: Every day | ORAL | 3 refills | Status: DC

## 2019-02-28 MED ORDER — HYDRALAZINE HCL 50 MG PO TABS: 50.0000 mg | ORAL_TABLET | Freq: Three times a day (TID) | ORAL | 3 refills | Status: DC

## 2019-02-28 MED ORDER — PRAVASTATIN SODIUM 20 MG PO TABS: 20.0000 mg | ORAL_TABLET | Freq: Every day | ORAL | 6 refills | Status: DC

## 2019-02-28 MED ORDER — DILTIAZEM HCL ER COATED BEADS 240 MG PO CP24: 240.0000 mg | ORAL_CAPSULE | Freq: Every day | ORAL | 3 refills | Status: DC

## 2019-02-28 MED ORDER — FLUTICASONE PROPIONATE 50 MCG/ACT NA SUSP: 2.0000 | Freq: Every day | NASAL | 0 refills | Status: DC

## 2019-02-28 MED ORDER — METOCLOPRAMIDE HCL 5 MG PO TABS: 5.0000 mg | ORAL_TABLET | Freq: Three times a day (TID) | ORAL | 4 refills | Status: DC | PRN

## 2019-02-28 MED ORDER — LEVOTHYROXINE SODIUM 200 MCG PO TABS: ORAL_TABLET | ORAL | 6 refills | Status: DC

## 2019-02-28 MED ORDER — FUROSEMIDE 20 MG PO TABS: 20.0000 mg | ORAL_TABLET | Freq: Every day | ORAL | 3 refills | Status: DC | PRN

## 2019-02-28 MED ORDER — FLUTICASONE-SALMETEROL 250-50 MCG/DOSE IN AEPB: 1.0000 | INHALATION_SPRAY | Freq: Two times a day (BID) | RESPIRATORY_TRACT | 0 refills | Status: DC

## 2019-02-28 MED ORDER — GABAPENTIN 300 MG PO CAPS: ORAL_CAPSULE | ORAL | 3 refills | Status: DC

## 2019-02-28 MED ORDER — ALBUTEROL SULFATE HFA 108 (90 BASE) MCG/ACT IN AERS: 2.0000 | INHALATION_SPRAY | Freq: Four times a day (QID) | RESPIRATORY_TRACT | 6 refills | Status: DC | PRN

## 2019-02-28 NOTE — Telephone Encounter (Signed)
New Message   Jarrett Soho calling form [facility], states the pt is requesting that all her medications be sent to [facility] that's on file and not the [facility], also if the pt can get a shower chair for her weakness and blood pressure cuff. Please f/u

## 2019-02-28 NOTE — Telephone Encounter (Signed)
Will forward to pcp

## 2019-03-12 ENCOUNTER — Other Ambulatory Visit: Payer: Self-pay | Admitting: Internal Medicine

## 2019-03-12 DIAGNOSIS — E039 Hypothyroidism, unspecified: Secondary | ICD-10-CM

## 2019-03-20 DIAGNOSIS — E109 Type 1 diabetes mellitus without complications: Secondary | ICD-10-CM | POA: Diagnosis not present

## 2019-04-03 ENCOUNTER — Other Ambulatory Visit: Payer: Self-pay | Admitting: Internal Medicine

## 2019-04-03 DIAGNOSIS — E1029 Type 1 diabetes mellitus with other diabetic kidney complication: Secondary | ICD-10-CM

## 2019-04-19 DIAGNOSIS — E109 Type 1 diabetes mellitus without complications: Secondary | ICD-10-CM | POA: Diagnosis not present

## 2019-04-22 ENCOUNTER — Telehealth: Payer: Self-pay | Admitting: Internal Medicine

## 2019-04-22 DIAGNOSIS — R0981 Nasal congestion: Secondary | ICD-10-CM

## 2019-04-22 MED ORDER — LORATADINE 10 MG PO TABS: ORAL_TABLET | ORAL | 2 refills | Status: DC

## 2019-04-22 NOTE — Telephone Encounter (Signed)
New Message  1) Medication(s) Requested (by name): Loratadine 10 mg  2) Pharmacy of Choice: [facility]  3) Special Requests:   Approved medications will be sent to the pharmacy, we will reach out if there is an issue.  Requests made after 3pm may not be addressed until the following business day!  If a patient is unsure of the name of the medication(s) please note and ask patient to call back when they are able to provide all info, do not send to responsible party until all information is available!

## 2019-04-30 ENCOUNTER — Encounter: Payer: Self-pay | Admitting: Internal Medicine

## 2019-04-30 ENCOUNTER — Ambulatory Visit: Payer: Self-pay | Admitting: Internal Medicine

## 2019-04-30 ENCOUNTER — Other Ambulatory Visit: Payer: Self-pay

## 2019-04-30 ENCOUNTER — Ambulatory Visit: Payer: Medicaid Other | Attending: Internal Medicine | Admitting: Internal Medicine

## 2019-04-30 VITALS — BP 154/100 | HR 89 | Temp 97.4°F | Resp 16 | Wt 147.0 lb

## 2019-04-30 DIAGNOSIS — F321 Major depressive disorder, single episode, moderate: Secondary | ICD-10-CM | POA: Diagnosis not present

## 2019-04-30 DIAGNOSIS — J4540 Moderate persistent asthma, uncomplicated: Secondary | ICD-10-CM

## 2019-04-30 DIAGNOSIS — D631 Anemia in chronic kidney disease: Secondary | ICD-10-CM

## 2019-04-30 DIAGNOSIS — I10 Essential (primary) hypertension: Secondary | ICD-10-CM | POA: Diagnosis not present

## 2019-04-30 DIAGNOSIS — I129 Hypertensive chronic kidney disease with stage 1 through stage 4 chronic kidney disease, or unspecified chronic kidney disease: Secondary | ICD-10-CM | POA: Diagnosis not present

## 2019-04-30 DIAGNOSIS — R809 Proteinuria, unspecified: Secondary | ICD-10-CM | POA: Diagnosis not present

## 2019-04-30 DIAGNOSIS — E875 Hyperkalemia: Secondary | ICD-10-CM | POA: Diagnosis not present

## 2019-04-30 DIAGNOSIS — E1022 Type 1 diabetes mellitus with diabetic chronic kidney disease: Secondary | ICD-10-CM | POA: Diagnosis not present

## 2019-04-30 DIAGNOSIS — E1029 Type 1 diabetes mellitus with other diabetic kidney complication: Secondary | ICD-10-CM | POA: Diagnosis not present

## 2019-04-30 DIAGNOSIS — Z79899 Other long term (current) drug therapy: Secondary | ICD-10-CM | POA: Diagnosis not present

## 2019-04-30 DIAGNOSIS — N2581 Secondary hyperparathyroidism of renal origin: Secondary | ICD-10-CM | POA: Diagnosis not present

## 2019-04-30 DIAGNOSIS — R109 Unspecified abdominal pain: Secondary | ICD-10-CM | POA: Diagnosis not present

## 2019-04-30 DIAGNOSIS — R801 Persistent proteinuria, unspecified: Secondary | ICD-10-CM | POA: Diagnosis not present

## 2019-04-30 DIAGNOSIS — E039 Hypothyroidism, unspecified: Secondary | ICD-10-CM | POA: Diagnosis not present

## 2019-04-30 DIAGNOSIS — Z794 Long term (current) use of insulin: Secondary | ICD-10-CM | POA: Diagnosis not present

## 2019-04-30 DIAGNOSIS — F1920 Other psychoactive substance dependence, uncomplicated: Secondary | ICD-10-CM | POA: Insufficient documentation

## 2019-04-30 DIAGNOSIS — N184 Chronic kidney disease, stage 4 (severe): Secondary | ICD-10-CM | POA: Diagnosis not present

## 2019-04-30 DIAGNOSIS — E1043 Type 1 diabetes mellitus with diabetic autonomic (poly)neuropathy: Secondary | ICD-10-CM | POA: Diagnosis not present

## 2019-04-30 DIAGNOSIS — K3184 Gastroparesis: Secondary | ICD-10-CM | POA: Diagnosis not present

## 2019-04-30 DIAGNOSIS — R10A1 Flank pain, right side: Secondary | ICD-10-CM

## 2019-04-30 DIAGNOSIS — R0789 Other chest pain: Secondary | ICD-10-CM | POA: Diagnosis not present

## 2019-04-30 DIAGNOSIS — Z9641 Presence of insulin pump (external) (internal): Secondary | ICD-10-CM | POA: Diagnosis not present

## 2019-04-30 LAB — POCT GLYCOSYLATED HEMOGLOBIN (HGB A1C): HbA1c, POC (controlled diabetic range): 10.9 % — AB (ref 0.0–7.0)

## 2019-04-30 LAB — GLUCOSE, POCT (MANUAL RESULT ENTRY): POC Glucose: 158 mg/dl — AB (ref 70–99)

## 2019-04-30 MED ORDER — BLOOD PRESSURE MONITOR DEVI: 0 refills | Status: DC

## 2019-04-30 NOTE — Progress Notes (Addendum)
Patient ID: Angela [name], female    DOB: 12-Jun-1986  MRN: [id number]  CC: Hypertension and Diabetes   Subjective: Angela [name] is a 33 y.o. female who presents for chronic ds management.  Her concerns today include:  Pt with hx of DM type 1 with neuropathy, gastroparesis and nephropathy, HTN, CKD stage 3-4, ACD/IDA, hypothyroid, HL  Last seen 01/2019.  Still has a little cough at night and first thing in the mornings.  Also there when she breaths in cold air.  Using Advair BID most days, on average skips 2 days/wk.  Using Albuterol 2-3 times a day.   Gave up smoking marijuana.  Lisinopril on med list but patient reports that she had stopped taking it about 2 to 3 months ago.  CKD3-4/HTN:  Had tele visit with nephrologist today.  Reports Lisinopril d/c because pt felt it was causing CP.  Pt states she stopped taking 2-3 mths ago. Started on Losartan 50 mg instead.  She has not picked up the prescription as yet. Did not take Cartia and Losartan as yet for the morning.   No lower extremity edema in the mornings but states that by afternoon she does have swelling.  Limiting salt.    Depression: Doing well on Lexapro  DM: Has an appointment coming up with the endocrinologist next month.  She is on an insulin pump along with [place] FlexPen.  Reports compliance with [facility].  Blood sugar is checked several times a day.  Gives range of 138-156.  Had low blood sugar episode yesterday morning.  Gave herself Novolog load and forgot to eat.   Feels she is doing better with eating habits. Less snacking on junk foods.  Eating more fruits instead  Thyroid: Reports compliance with levothyroxine   Patient Active Problem List   Diagnosis Date Noted  . Chemical dependency ([place]) 04/30/2019  . Ovarian mass, left 11/27/2018  . Moderate major depression ([place]) 11/27/2018  . Cardiac murmur 09/09/2018  . Right flank mass 05/03/2018  . Secondary hyperparathyroidism ([place]) 05/01/2018  . Anxiety and  depression 06/23/2017  . Insomnia 01/12/2016  . Non compliance with medical treatment 01/05/2016  . Hyperlipidemia 01/05/2016  . Chronic kidney disease (CKD) stage G3a/A3, moderately decreased glomerular filtration rate (GFR) between 45-59 mL/min/1.73 square meter and albuminuria creatinine ratio greater than 300 mg/g (HCC) 07/28/2015  . Neurogenic bladder 02/20/2015  . Diabetic peripheral neuropathy associated with type 1 diabetes mellitus ([name]) 02/20/2015  . Iron deficiency 02/13/2015  . Asthma 09/30/2014  . Diabetic retinopathy ([place]) 09/30/2014  . Atypical squamous cells of undetermined significance (ASCUS) on Papanicolaou smear of cervix 08/11/2014  . Diabetic gastroparesis associated with type 1 diabetes mellitus ([place]) 08/05/2014  . Anemia in chronic renal disease 08/05/2014  . HTN (hypertension) 07/11/2012  . GERD (gastroesophageal reflux disease) 09/26/2011  . Hypothyroidism 09/14/2006  . 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, with long-term current use of insulin ([place]) 01/15/2000     Current Outpatient Medications on File Prior to Visit  Medication Sig Dispense Refill  . losartan (COZAAR) 50 MG tablet Take by mouth.    [name] albuterol (PROVENTIL HFA;VENTOLIN HFA) 108 (90 Base) MCG/ACT inhaler Inhale 2 puffs into the lungs every 6 (six) hours as needed for wheezing or shortness of breath. 1 Inhaler 6  . amLODipine (NORVASC) 10 MG tablet Take 1 tablet (10 mg total) by mouth daily. 90 tablet 3  . busPIRone (BUSPAR) 10 MG tablet Take 1 tablet (10 mg total) by mouth 2 (two) times  daily. 60 tablet 3  . Cholecalciferol (VITAMIN D3) 1000 units CAPS Take 1,000 Units by mouth daily.     [name] diltiazem (CARTIA XT) 240 MG 24 hr capsule Take 1 capsule (240 mg total) by mouth daily. 30 capsule 3  . ergocalciferol (DRISDOL) 1.25 MG (50000 UT) capsule Take 1 capsule (50,000 Units total) by mouth once a week. 9 capsule 1  . escitalopram (LEXAPRO) 20 MG tablet Take 1 tablet (20 mg total) by  mouth daily. 30 tablet 3  . fluticasone (FLONASE) 50 MCG/ACT nasal spray Place 2 sprays into both nostrils daily. 16 g 0  . Fluticasone-Salmeterol (ADVAIR) 250-50 MCG/DOSE AEPB Inhale 1 puff into the lungs 2 (two) times daily. 60 each 0  . furosemide (LASIX) 20 MG tablet Take 1 tablet (20 mg total) by mouth daily as needed for edema. 30 tablet 3  . gabapentin (NEURONTIN) 300 MG capsule TAKE 1 CAPSULE BY MOUTH 2 TIMES DAILY. 60 capsule 3  . hydrALAZINE (APRESOLINE) 50 MG tablet Take 1 tablet (50 mg total) by mouth 3 (three) times daily. 90 tablet 3  . HYDROcodone-homatropine (HYCODAN) 5-1.5 MG/5ML syrup Take 5 mLs by mouth at bedtime as needed for cough. 80 mL 0  . hydroxypropyl methylcellulose / hypromellose (ISOPTO TEARS / GONIOVISC) 2.5 % ophthalmic solution Place 1 drop into both eyes 3 (three) times daily as needed for dry eyes.    . insulin aspart (NOVOLOG) 100 UNIT/ML injection USE AS DIRECTED IN INSULIN PUMP 10 mL 2  . Insulin Pen Needle 32G X 4 MM MISC Used to give insulin injections four times daily. 200 each 11  . Insulin Syringe-Needle U-100 (BD INSULIN SYRINGE ULTRAFINE) 31G X 15/64" 1 ML MISC Used to give daily insulin injections. 100 each 11  . levothyroxine (SYNTHROID, LEVOTHROID) 200 MCG tablet TAKE 1 TABLET BY MOUTH DAILY BEFORE BREAKFAST. 30 tablet 6  . loratadine (CLARITIN) 10 MG tablet 1 tab every 24-48 hours PRN 30 tablet 2  . metoCLOPramide (REGLAN) 5 MG tablet Take 1 tablet (5 mg total) by mouth 3 (three) times daily as needed for nausea or vomiting. 60 tablet 4  . patiromer (VELTASSA) 8.4 g packet Take 8.4 g by mouth daily.     . pravastatin (PRAVACHOL) 20 MG tablet Take 1 tablet (20 mg total) by mouth daily. 30 tablet 6  . sodium bicarbonate 650 MG tablet Take 1 tablet (650 mg total) by mouth 2 (two) times daily. 60 tablet 4  . TRESIBA FLEXTOUCH 100 UNIT/ML SOPN FlexTouch Pen INJECT 40 UNITS ONCE DAILY WITH ADDITONAL AS NEEDED. MAX DAILY DOSE OF 50 UNITS  3   No current  facility-administered medications on file prior to visit.     Allergies  Allergen Reactions  . Ferumoxytol Itching  . Lisinopril Other (See Comments)    hyperkalemia  . Sulfamethoxazole Hives and Itching  . Trimethoprim Hives    Social History   Socioeconomic History  . Marital status: Divorced    Spouse name: Not on file  . Number of children: 0  . Years of education: 10th grade  . Highest education level: Not on file  Occupational History  . Occupation: unemployed    Comment: worked at a group  Social Needs  . Financial resource strain: Not on file  . Food insecurity:    Worry: Not on file    Inability: Not on file  . Transportation needs:    Medical: Not on file    Non-medical: Not on file  Tobacco Use  .  Smoking status: Former Smoker    Packs/day: 0.25    Years: 2.00    Pack years: 0.50    Types: Cigarettes    Last attempt to quit: 03/04/2013    Years since quitting: 6.1  . Smokeless tobacco: Never Used  Substance and Sexual Activity  . Alcohol use: No    Alcohol/week: 0.0 standard drinks  . Drug use: Yes    Types: Marijuana    Comment: last used 4 months ago  . Sexual activity: Yes    Partners: Female    Birth control/protection: None    Comment: women preference   Lifestyle  . Physical activity:    Days per week: Not on file    Minutes per session: Not on file  . Stress: Not on file  Relationships  . Social connections:    Talks on phone: Not on file    Gets together: Not on file    Attends religious service: Not on file    Active member of club or organization: Not on file    Attends meetings of clubs or organizations: Not on file    Relationship status: Not on file  . Intimate partner violence:    Fear of current or ex partner: Not on file    Emotionally abused: Not on file    Physically abused: Not on file    Forced sexual activity: Not on file  Other Topics Concern  . Not on file  Social History Narrative   Occupation: currently  unemployed   Single   Homosexual,     Lives with mom.    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            Family History  Problem Relation Age of Onset  . Multiple sclerosis Mother   . Hypothyroidism Mother   . Stroke Mother        at age 34 yo  . Hyperlipidemia Maternal Grandmother   . Hypertension Maternal Grandmother   . Heart disease Maternal Grandmother        unknown type  . Diabetes Maternal Grandmother   . Hypertension Maternal Grandfather   . Prostate cancer Maternal Grandfather   . Diabetes type I Maternal Grandfather   . Breast cancer Paternal Grandmother   . Cancer Neg Hx     Past Surgical History:  Procedure Laterality Date  . FOOT FUSION Right 2006   "put screws in it too" (09/19/2013)    ROS: Review of Systems GU: Reports of some pain in the right flank when she urinates.  No hematuria.  PHYSICAL EXAM: BP (!) 154/100   Pulse 89   Temp (!) 97.4 F (36.3 C) (Oral)   Resp 16   Wt 147 lb (66.7 kg)   SpO2 100%   BMI 24.46 kg/m   Wt Readings from Last 3 Encounters:  04/30/19 147 lb (66.7 kg)  02/15/19 142 lb 9.6 oz (64.7 kg)  02/11/19 141 lb (64 kg)    Physical Exam  General appearance - alert, well appearing, and in no distress Mental status - normal mood, behavior, speech, dress, motor activity, and thought  processes Nose - normal and patent, no erythema, discharge or polyps Mouth - mucous membranes moist, pharynx normal without lesions Neck - supple, no significant adenopathy Chest - clear to auscultation, no wheezes, rales or rhonchi, symmetric air entry Heart - normal rate, regular rhythm, normal S1, S2, no murmurs, rubs, clicks or  gallops Extremities -no lower extremity edema noted at this time  Results for orders placed or performed in visit on 04/30/19  POCT glucose (manual entry)  Result Value Ref Range   POC Glucose 158 (A) 70 - 99 mg/dl  POCT glycosylated hemoglobin (Hb A1C)  Result Value Ref Range   Hemoglobin A1C     HbA1c POC (<> result, manual entry)     HbA1c, POC (prediabetic range)     HbA1c, POC (controlled diabetic range) 10.9 (A) 0.0 - 7.0 %    Depression screen [facility] 2/9 02/15/2019 01/29/2019 01/02/2019  Decreased Interest 1 1 1   Down, Depressed, Hopeless 1 1 1   PHQ - 2 Score 2 2 2   Altered sleeping 1 1 3   Tired, decreased energy 1 1 2   Change in appetite 1 1 3   Feeling bad or failure about yourself  1 1 1   Trouble concentrating 1 1 1   Moving slowly or fidgety/restless 1 1 1   Suicidal thoughts 0 0 0  PHQ-9 Score 8 8 13   Some recent data might be hidden     CMP Latest Ref Rng & Units 11/21/2018 08/24/2018 05/21/2018  Glucose 70 - 99 mg/dL 145(H) 117(H) 335(H)  BUN 6 - 20 mg/dL 21(H) 26(H) 34(H)  Creatinine 0.44 - 1.00 mg/dL 2.62(H) 3.14(H) 2.85(H)  Sodium 135 - 145 mmol/L 136 139 135  Potassium 3.5 - 5.1 mmol/L 4.1 4.5 4.0  Chloride 98 - 111 mmol/L 102 102 98  CO2 22 - 32 mmol/L 27 26 20   Calcium 8.9 - 10.3 mg/dL 10.0 9.9 9.3  Total Protein 6.5 - 8.1 g/dL 6.8 7.6 -  Total Bilirubin 0.3 - 1.2 mg/dL 0.7 0.7 -  Alkaline Phos 38 - 126 U/L 64 71 -  AST 15 - 41 U/L 15 28 -  ALT 0 - 44 U/L 11 15 -   Lipid Panel     Component Value Date/Time   CHOL 254 (H) 06/23/2017 1151   TRIG 125 06/23/2017 1151   HDL 87 06/23/2017 1151   CHOLHDL 2.9 06/23/2017 1151   CHOLHDL 3.8 02/04/2016 1703   VLDL 71 (H) 02/04/2016 1703   LDLCALC 142 (H) 06/23/2017 1151    CBC    Component Value Date/Time   WBC 4.9 11/21/2018 1931   RBC 3.52 (L) 11/21/2018 1931   HGB 9.7 (L) 11/21/2018 1931   HGB 10.6 (L) 05/21/2018 1707   HCT 31.7 (L) 11/21/2018 1931   HCT 34.0 05/21/2018 1707   PLT 188 11/21/2018 1931    PLT 177 05/21/2018 1707   MCV 90.1 11/21/2018 1931   MCV 88 05/21/2018 1707   MCH 27.6 11/21/2018 1931   MCHC 30.6 11/21/2018 1931   RDW 11.8 11/21/2018 1931   RDW 12.2 (L) 05/21/2018 1707   LYMPHSABS 2.3 05/21/2018 1707   MONOABS 0.3 03/01/2018 1421   EOSABS 0.0 05/21/2018 1707   BASOSABS 0.0 05/21/2018 1707    ASSESSMENT AND PLAN: 1. 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, with long-term current use of insulin (HCC) A1c today is 10.9.  Reported blood sugars do not correlate with this A1c.  However A1c may not be as accurate in patients with chronic kidney disease  who have anemia on top of it. -I recommend that she touch base with her endocrinologist to discuss her blood sugars and her current A1c reading for instructions on how to adjust her insulin. -Amended her on the changes that she has made in her eating habits so far - POCT glucose (manual entry) - POCT glycosylated hemoglobin (Hb A1C) - CBC - Comprehensive metabolic panel  2. Essential hypertension Not at goal.  Encourage compliance with medications.  She will pick up prescription for Cozaar today.  We will send prescription to her pharmacy for home blood pressure monitoring device.  Advised to check blood pressure about twice a week.  Goal is 130/80 or lower - Blood Pressure Monitor DEVI; Use as directed to check home blood pressure 2-3 times a week  Dispense: 1 Device; Refill: 0  3. Moderate major depression (HCC) Doing well on Lexapro  4. Hypothyroidism, unspecified type - TSH  5. Moderate persistent asthma, unspecified whether complicated Compliance with Advair. We will have her see Dr. Joya Gaskins - Peak flow meter  6. CKD (chronic kidney disease) stage 4, GFR 15-29 ml/min (HCC) - Comprehensive metabolic panel  7. Right flank pain - Urinalysis  Patient was given the opportunity to ask questions.  Patient verbalized understanding of the plan and was able to repeat key elements of the plan.    Addendum:  05/01/2019 -phone call placed to patient this a.m. to discuss lab results.  Patient informed that her thyroid level is off.  She confirms that she is taking levothyroxine 200 mcg daily.  I recommend decrease to 175 mcg daily.  We will recheck thyroid level in about 6 weeks. -Informed that she does not have a urinary tract infection based on UA. -Informed that kidney function remains poor.  I see that her nephrologist also wanted magnesium, phosphorus and iron studies so I have asked the lab to add these levels on.  I will fax results to her nephrologist once all lab results have come back -Potassium level elevated at 5.6.  I see that her nephrologist had started her on Veltassa 8.4 g to help keep her potassium level down.  Patient states that she was out of it for 2 weeks but plan to pick it up today.  Advised her to pick it up and take as prescribed.  Return to the lab in 1 week for repeat potassium level check. Patient expressed understanding of the plan. Orders Placed This Encounter  Procedures  . Peak flow meter  . TSH  . Urinalysis  . CBC  . Comprehensive metabolic panel  . POCT glucose (manual entry)  . POCT glycosylated hemoglobin (Hb A1C)     Requested Prescriptions   Signed Prescriptions Disp Refills  . Blood Pressure Monitor DEVI 1 Device 0    Sig: Use as directed to check home blood pressure 2-3 times a week    Return in about 3 months (around 07/31/2019).  Karle Plumber, MD, FACP

## 2019-04-30 NOTE — Patient Instructions (Signed)
Please give patient an appointment with Dr. Joya Gaskins in 2 to 3 weeks for asthma.  Please take your blood pressure medications as prescribed.  You can take the furosemide daily if you have swelling in the legs in the afternoons.  I have sent a prescription to your pharmacy for blood pressure monitoring device.  Goal blood pressure is 130/80 or lower.  Please touch base with your endocrinologist and let them know that your A1c today was 10.9.

## 2019-05-01 LAB — URINALYSIS
Bilirubin, UA: NEGATIVE
Ketones, UA: NEGATIVE
Leukocytes,UA: NEGATIVE
Nitrite, UA: NEGATIVE
Specific Gravity, UA: 1.015 (ref 1.005–1.030)
Urobilinogen, Ur: 0.2 mg/dL (ref 0.2–1.0)
pH, UA: 7.5 (ref 5.0–7.5)

## 2019-05-01 LAB — CBC
Hematocrit: 31.4 % — ABNORMAL LOW (ref 34.0–46.6)
Hemoglobin: 9.6 g/dL — ABNORMAL LOW (ref 11.1–15.9)
MCH: 26.8 pg (ref 26.6–33.0)
MCHC: 30.6 g/dL — ABNORMAL LOW (ref 31.5–35.7)
MCV: 88 fL (ref 79–97)
Platelets: 155 10*3/uL (ref 150–450)
RBC: 3.58 x10E6/uL — ABNORMAL LOW (ref 3.77–5.28)
RDW: 11.2 % — ABNORMAL LOW (ref 11.7–15.4)
WBC: 4.7 10*3/uL (ref 3.4–10.8)

## 2019-05-01 LAB — COMPREHENSIVE METABOLIC PANEL
ALT: 12 IU/L (ref 0–32)
AST: 17 IU/L (ref 0–40)
Albumin/Globulin Ratio: 1.8 (ref 1.2–2.2)
Albumin: 4.3 g/dL (ref 3.8–4.8)
Alkaline Phosphatase: 103 IU/L (ref 39–117)
BUN/Creatinine Ratio: 10 (ref 9–23)
BUN: 32 mg/dL — ABNORMAL HIGH (ref 6–20)
Bilirubin Total: 0.3 mg/dL (ref 0.0–1.2)
CO2: 19 mmol/L — ABNORMAL LOW (ref 20–29)
Calcium: 9.9 mg/dL (ref 8.7–10.2)
Chloride: 102 mmol/L (ref 96–106)
Creatinine, Ser: 3.23 mg/dL — ABNORMAL HIGH (ref 0.57–1.00)
GFR calc Af Amer: 21 mL/min/{1.73_m2} — ABNORMAL LOW (ref >59)
GFR calc non Af Amer: 18 mL/min/{1.73_m2} — ABNORMAL LOW (ref >59)
Globulin, Total: 2.4 g/dL (ref 1.5–4.5)
Glucose: 227 mg/dL — ABNORMAL HIGH (ref 65–99)
Potassium: 5.6 mmol/L — ABNORMAL HIGH (ref 3.5–5.2)
Sodium: 136 mmol/L (ref 134–144)
Total Protein: 6.7 g/dL (ref 6.0–8.5)

## 2019-05-01 LAB — TSH: TSH: 0.309 u[IU]/mL — ABNORMAL LOW (ref 0.450–4.500)

## 2019-05-01 LAB — SPECIMEN STATUS REPORT

## 2019-05-01 MED ORDER — LEVOTHYROXINE SODIUM 175 MCG PO TABS: ORAL_TABLET | ORAL | 3 refills | Status: DC

## 2019-05-01 NOTE — Addendum Note (Signed)
Addended by: Karle Plumber B on: 05/01/2019 10:49 AM   Modules accepted: Orders

## 2019-05-03 LAB — SPECIMEN STATUS REPORT

## 2019-05-03 LAB — IRON,TIBC AND FERRITIN PANEL
Ferritin: 717 ng/mL — ABNORMAL HIGH (ref 15–150)
Iron Saturation: 41 % (ref 15–55)
Iron: 80 ug/dL (ref 27–159)
Total Iron Binding Capacity: 194 ug/dL — ABNORMAL LOW (ref 250–450)
UIBC: 114 ug/dL — ABNORMAL LOW (ref 131–425)

## 2019-05-03 LAB — PHOSPHORUS: Phosphorus: 3.2 mg/dL (ref 3.0–4.3)

## 2019-05-03 LAB — MAGNESIUM: Magnesium: 1.8 mg/dL (ref 1.6–2.3)

## 2019-05-10 ENCOUNTER — Encounter: Payer: Self-pay | Admitting: Internal Medicine

## 2019-05-10 ENCOUNTER — Telehealth: Payer: Self-pay | Admitting: Internal Medicine

## 2019-05-10 NOTE — Telephone Encounter (Signed)
Called the pt and she stated she stated she had been taken the reglan. I advised her to go to the ER. Patient states she cant go because she doesn't want to die alone.

## 2019-05-10 NOTE — Telephone Encounter (Signed)
Patient states there is a lot of mucus in the back of her throat.

## 2019-05-10 NOTE — Telephone Encounter (Signed)
Pt sent Mychart message.

## 2019-05-10 NOTE — Telephone Encounter (Signed)
Patient called and said she is having chest pain a cough has been throwing up she would like something for throwing up. She also states she is having cold chills and hot flashes.

## 2019-05-10 NOTE — Telephone Encounter (Signed)
Please advise patient on being seen in the ER if she has taken the reglan and still continues to vomit and have cough.
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
## 2019-05-14 ENCOUNTER — Encounter: Payer: Self-pay | Admitting: Critical Care Medicine

## 2019-05-14 ENCOUNTER — Other Ambulatory Visit: Payer: Self-pay

## 2019-05-14 ENCOUNTER — Ambulatory Visit: Payer: Medicaid Other | Attending: Critical Care Medicine | Admitting: Critical Care Medicine

## 2019-05-14 ENCOUNTER — Telehealth: Payer: Self-pay | Admitting: Internal Medicine

## 2019-05-14 ENCOUNTER — Other Ambulatory Visit: Payer: Self-pay | Admitting: Pharmacist

## 2019-05-14 VITALS — BP 159/97 | HR 84 | Temp 97.8°F | Ht 65.0 in | Wt 150.8 lb

## 2019-05-14 DIAGNOSIS — Z794 Long term (current) use of insulin: Secondary | ICD-10-CM | POA: Insufficient documentation

## 2019-05-14 DIAGNOSIS — I129 Hypertensive chronic kidney disease with stage 1 through stage 4 chronic kidney disease, or unspecified chronic kidney disease: Secondary | ICD-10-CM | POA: Insufficient documentation

## 2019-05-14 DIAGNOSIS — Z823 Family history of stroke: Secondary | ICD-10-CM | POA: Insufficient documentation

## 2019-05-14 DIAGNOSIS — Z7951 Long term (current) use of inhaled steroids: Secondary | ICD-10-CM | POA: Diagnosis not present

## 2019-05-14 DIAGNOSIS — Z79890 Hormone replacement therapy (postmenopausal): Secondary | ICD-10-CM | POA: Insufficient documentation

## 2019-05-14 DIAGNOSIS — G4700 Insomnia, unspecified: Secondary | ICD-10-CM | POA: Insufficient documentation

## 2019-05-14 DIAGNOSIS — Z8349 Family history of other endocrine, nutritional and metabolic diseases: Secondary | ICD-10-CM | POA: Diagnosis not present

## 2019-05-14 DIAGNOSIS — Z79899 Other long term (current) drug therapy: Secondary | ICD-10-CM | POA: Diagnosis not present

## 2019-05-14 DIAGNOSIS — E785 Hyperlipidemia, unspecified: Secondary | ICD-10-CM | POA: Insufficient documentation

## 2019-05-14 DIAGNOSIS — E1043 Type 1 diabetes mellitus with diabetic autonomic (poly)neuropathy: Secondary | ICD-10-CM | POA: Insufficient documentation

## 2019-05-14 DIAGNOSIS — K219 Gastro-esophageal reflux disease without esophagitis: Secondary | ICD-10-CM | POA: Insufficient documentation

## 2019-05-14 DIAGNOSIS — E875 Hyperkalemia: Secondary | ICD-10-CM | POA: Diagnosis not present

## 2019-05-14 DIAGNOSIS — J45909 Unspecified asthma, uncomplicated: Secondary | ICD-10-CM | POA: Diagnosis present

## 2019-05-14 DIAGNOSIS — Z882 Allergy status to sulfonamides status: Secondary | ICD-10-CM | POA: Diagnosis not present

## 2019-05-14 DIAGNOSIS — K3184 Gastroparesis: Secondary | ICD-10-CM | POA: Diagnosis not present

## 2019-05-14 DIAGNOSIS — F329 Major depressive disorder, single episode, unspecified: Secondary | ICD-10-CM | POA: Insufficient documentation

## 2019-05-14 DIAGNOSIS — E039 Hypothyroidism, unspecified: Secondary | ICD-10-CM | POA: Diagnosis not present

## 2019-05-14 DIAGNOSIS — J4540 Moderate persistent asthma, uncomplicated: Secondary | ICD-10-CM

## 2019-05-14 DIAGNOSIS — Z881 Allergy status to other antibiotic agents status: Secondary | ICD-10-CM | POA: Diagnosis not present

## 2019-05-14 DIAGNOSIS — Z888 Allergy status to other drugs, medicaments and biological substances status: Secondary | ICD-10-CM | POA: Diagnosis not present

## 2019-05-14 DIAGNOSIS — E1022 Type 1 diabetes mellitus with diabetic chronic kidney disease: Secondary | ICD-10-CM | POA: Insufficient documentation

## 2019-05-14 DIAGNOSIS — Z87891 Personal history of nicotine dependence: Secondary | ICD-10-CM | POA: Insufficient documentation

## 2019-05-14 DIAGNOSIS — N183 Chronic kidney disease, stage 3 (moderate): Secondary | ICD-10-CM | POA: Diagnosis not present

## 2019-05-14 DIAGNOSIS — E1029 Type 1 diabetes mellitus with other diabetic kidney complication: Secondary | ICD-10-CM

## 2019-05-14 MED ORDER — PANTOPRAZOLE SODIUM 40 MG PO TBEC: 40.0000 mg | DELAYED_RELEASE_TABLET | Freq: Every day | ORAL | 3 refills | Status: DC

## 2019-05-14 MED ORDER — BENZONATATE 200 MG PO CAPS: 200.0000 mg | ORAL_CAPSULE | Freq: Three times a day (TID) | ORAL | 1 refills | Status: DC | PRN

## 2019-05-14 MED ORDER — INSULIN ASPART 100 UNIT/ML ~~LOC~~ SOLN: SUBCUTANEOUS | 2 refills | Status: DC

## 2019-05-14 NOTE — Assessment & Plan Note (Signed)
Gastroesophageal reflux disease is playing a significant role in this case

## 2019-05-14 NOTE — Patient Instructions (Signed)
Begin Protonix 1 daily 1/2-hour before breakfast then eat  Stop Advair  Use albuterol as needed  Try benzonatate 200 mg 3 times daily on a regular basis to see if this will control your cough  Lung function test will be obtained  Basic metabolic panel for today  Return to see Dr. Joya Gaskins in 6 weeks after lung function test is performed

## 2019-05-14 NOTE — Telephone Encounter (Signed)
Called the patient and informed her it was there

## 2019-05-14 NOTE — Telephone Encounter (Signed)
Call and see why [facility] needs a refill of novolog

## 2019-05-14 NOTE — Assessment & Plan Note (Signed)
Mild to moderate intermittent asthma with largely upper airway component with cyclical cough exacerbated by reflux disease  I believe the Advair dry powdered inhaler may be worsening the patient's symptom complex  Plan is to discontinue Advair and begin Protonix 40 mg daily  We will also control the cough using benzonatate at 200 mg 3 times daily  We will avoid systemic steroids with the patient's type 1 diabetes  The patient will continue use albuterol as needed and we will schedule a pulmonary function study

## 2019-05-14 NOTE — Assessment & Plan Note (Signed)
The patient recently had hyperkalemia and this needs to be rechecked  Repeat basic metabolic panel

## 2019-05-14 NOTE — Telephone Encounter (Signed)
Pt is able to fill novolog at [facility], they mentioned she may want to fill it at a different location, she should call the most convenient location to have it filled.

## 2019-05-14 NOTE — Progress Notes (Signed)
Coughing a lot when she walks or cold air, per pt it gets so bad to the point where she vomits due to coughing too much

## 2019-05-15 LAB — BASIC METABOLIC PANEL
BUN/Creatinine Ratio: 12 (ref 9–23)
BUN: 36 mg/dL — ABNORMAL HIGH (ref 6–20)
CO2: 20 mmol/L (ref 20–29)
Calcium: 9.8 mg/dL (ref 8.7–10.2)
Chloride: 101 mmol/L (ref 96–106)
Creatinine, Ser: 3.1 mg/dL — ABNORMAL HIGH (ref 0.57–1.00)
GFR calc Af Amer: 22 mL/min/{1.73_m2} — ABNORMAL LOW (ref >59)
GFR calc non Af Amer: 19 mL/min/{1.73_m2} — ABNORMAL LOW (ref >59)
Glucose: 133 mg/dL — ABNORMAL HIGH (ref 65–99)
Potassium: 5.7 mmol/L — ABNORMAL HIGH (ref 3.5–5.2)
Sodium: 136 mmol/L (ref 134–144)

## 2019-05-20 DIAGNOSIS — E109 Type 1 diabetes mellitus without complications: Secondary | ICD-10-CM | POA: Diagnosis not present

## 2019-06-14 DIAGNOSIS — E109 Type 1 diabetes mellitus without complications: Secondary | ICD-10-CM | POA: Diagnosis not present

## 2019-06-25 ENCOUNTER — Ambulatory Visit: Payer: Medicaid Other | Admitting: Internal Medicine

## 2019-06-25 ENCOUNTER — Encounter: Payer: Self-pay | Admitting: Critical Care Medicine

## 2019-06-25 ENCOUNTER — Ambulatory Visit: Payer: Medicaid Other | Admitting: Critical Care Medicine
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
## 2019-08-01 ENCOUNTER — Other Ambulatory Visit: Payer: Self-pay

## 2019-08-01 ENCOUNTER — Ambulatory Visit: Payer: Medicaid Other | Attending: Internal Medicine | Admitting: Internal Medicine

## 2019-08-01 ENCOUNTER — Encounter: Payer: Self-pay | Admitting: Internal Medicine

## 2019-08-01 VITALS — BP 160/100 | HR 95 | Temp 98.5°F | Resp 16 | Wt 145.0 lb

## 2019-08-01 DIAGNOSIS — Z882 Allergy status to sulfonamides status: Secondary | ICD-10-CM | POA: Insufficient documentation

## 2019-08-01 DIAGNOSIS — N921 Excessive and frequent menstruation with irregular cycle: Secondary | ICD-10-CM

## 2019-08-01 DIAGNOSIS — F419 Anxiety disorder, unspecified: Secondary | ICD-10-CM | POA: Diagnosis not present

## 2019-08-01 DIAGNOSIS — Z881 Allergy status to other antibiotic agents status: Secondary | ICD-10-CM | POA: Insufficient documentation

## 2019-08-01 DIAGNOSIS — I129 Hypertensive chronic kidney disease with stage 1 through stage 4 chronic kidney disease, or unspecified chronic kidney disease: Secondary | ICD-10-CM | POA: Diagnosis not present

## 2019-08-01 DIAGNOSIS — N184 Chronic kidney disease, stage 4 (severe): Secondary | ICD-10-CM | POA: Diagnosis not present

## 2019-08-01 DIAGNOSIS — Z79890 Hormone replacement therapy (postmenopausal): Secondary | ICD-10-CM | POA: Diagnosis not present

## 2019-08-01 DIAGNOSIS — Z87891 Personal history of nicotine dependence: Secondary | ICD-10-CM | POA: Insufficient documentation

## 2019-08-01 DIAGNOSIS — G4700 Insomnia, unspecified: Secondary | ICD-10-CM | POA: Diagnosis not present

## 2019-08-01 DIAGNOSIS — F329 Major depressive disorder, single episode, unspecified: Secondary | ICD-10-CM | POA: Diagnosis not present

## 2019-08-01 DIAGNOSIS — E1029 Type 1 diabetes mellitus with other diabetic kidney complication: Secondary | ICD-10-CM

## 2019-08-01 DIAGNOSIS — K219 Gastro-esophageal reflux disease without esophagitis: Secondary | ICD-10-CM | POA: Insufficient documentation

## 2019-08-01 DIAGNOSIS — Z794 Long term (current) use of insulin: Secondary | ICD-10-CM | POA: Diagnosis not present

## 2019-08-01 DIAGNOSIS — Z79899 Other long term (current) drug therapy: Secondary | ICD-10-CM | POA: Insufficient documentation

## 2019-08-01 DIAGNOSIS — E1022 Type 1 diabetes mellitus with diabetic chronic kidney disease: Secondary | ICD-10-CM | POA: Diagnosis not present

## 2019-08-01 DIAGNOSIS — E1043 Type 1 diabetes mellitus with diabetic autonomic (poly)neuropathy: Secondary | ICD-10-CM | POA: Insufficient documentation

## 2019-08-01 DIAGNOSIS — E039 Hypothyroidism, unspecified: Secondary | ICD-10-CM | POA: Insufficient documentation

## 2019-08-01 DIAGNOSIS — Z8249 Family history of ischemic heart disease and other diseases of the circulatory system: Secondary | ICD-10-CM | POA: Insufficient documentation

## 2019-08-01 DIAGNOSIS — E785 Hyperlipidemia, unspecified: Secondary | ICD-10-CM | POA: Diagnosis not present

## 2019-08-01 DIAGNOSIS — N920 Excessive and frequent menstruation with regular cycle: Secondary | ICD-10-CM | POA: Insufficient documentation

## 2019-08-01 DIAGNOSIS — J4520 Mild intermittent asthma, uncomplicated: Secondary | ICD-10-CM | POA: Diagnosis not present

## 2019-08-01 DIAGNOSIS — Z888 Allergy status to other drugs, medicaments and biological substances status: Secondary | ICD-10-CM | POA: Diagnosis not present

## 2019-08-01 DIAGNOSIS — D631 Anemia in chronic kidney disease: Secondary | ICD-10-CM

## 2019-08-01 DIAGNOSIS — I10 Essential (primary) hypertension: Secondary | ICD-10-CM | POA: Diagnosis not present

## 2019-08-01 LAB — GLUCOSE, POCT (MANUAL RESULT ENTRY): POC Glucose: 240 mg/dl — AB (ref 70–99)

## 2019-08-01 LAB — POCT GLYCOSYLATED HEMOGLOBIN (HGB A1C): HbA1c, POC (controlled diabetic range): 10.5 % — AB (ref 0.0–7.0)

## 2019-08-01 MED ORDER — HYDRALAZINE HCL 50 MG PO TABS: 75.0000 mg | ORAL_TABLET | Freq: Three times a day (TID) | ORAL | 6 refills | Status: DC

## 2019-08-01 NOTE — Progress Notes (Signed)
Patient ID: Angela [name], female    DOB: 02/13/1986  MRN: [id number]  CC: Diabetes and Hypertension   Subjective: Angela [name] is a 33 y.o. female who presents for chronic disease management Her concerns today include:  Pt with hx of DM type 1 with neuropathy, gastroparesis and nephropathy, HTN, CKD stage 4, ACD/IDA, hypothyroid, HL  Asthma: She saw Dr. Joya Gaskins since last visit with me.  She was assessed to have mild to moderate intermittent asthma with largely upper airway component and cyclical cough exacerbated by GERD.  Advair was discontinued and patient started on Protonix.  Doing much better  HTN/CKD stage IV:  Checks BP 2-3 days a day.  Gives range of 200s/90-100s Compliant with meds and salt restriction No LE edema -Last chemistry was done 04/2019.  I did fax those results to her nephrologist Dr. Ronnald Ramp.  Potassium level was elevated.  Based on her note, furosemide was increased to 40 mg twice a day, sodium bicarb increased to 1300 mg twice a day, Velt[place] to 16.8 g daily and patient was referred to transplant clinic.   -DM: has no f/u with endocrinologist in the future.  She last saw them back in November. BS have been up and down.  She tells me that her insulin pump had malfunctioned and she recently got a new one.  Reports compliance with Tawn[facility] Asal. -She tells me she does okay with her eating habits.  She had Mirena placed 01/2019.  Since having this placed she has had 4 menstrual cycles in the last 2 months lasting 3 to 4 days.  Menstrual bleeding not heavy except last week when she had a cycle that was very painful, heavy with a lot of clots.  Last Hb was 9.6 04/2019   Patient Active Problem List   Diagnosis Date Noted  . Ovarian mass, left 11/27/2018  . Moderate major depression ([place]) 11/27/2018  . Right flank mass 05/03/2018  . Secondary hyperparathyroidism ([place]) 05/01/2018  . Anxiety and depression 06/23/2017  . Insomnia 01/12/2016  . Non compliance with medical  treatment 01/05/2016  . Hyperlipidemia 01/05/2016  . Chronic kidney disease (CKD) stage G3a/A3, moderately decreased glomerular filtration rate (GFR) between 45-59 mL/min/1.73 square meter and albuminuria creatinine ratio greater than 300 mg/g ([facility]) 07/28/2015  . Neurogenic bladder 02/20/2015  . Diabetic peripheral neuropathy associated with type 1 diabetes mellitus ([place]) 02/20/2015  . Iron deficiency 02/13/2015  . Asthma 09/30/2014  . Diabetic retinopathy ([place]) 09/30/2014  . Atypical squamous cells of undetermined significance (ASCUS) on Papanicolaou smear of cervix 08/11/2014  . Diabetic gastroparesis associated with type 1 diabetes mellitus ([place]) 08/05/2014  . Anemia in chronic renal disease 08/05/2014  . Hyperkalemia 08/04/2014  . HTN (hypertension) 07/11/2012  . GERD (gastroesophageal reflux disease) 09/26/2011  . Hypothyroidism 09/14/2006  . 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, with long-term current use of insulin ([place]) 01/15/2000     Current Outpatient Medications on File Prior to Visit  Medication Sig Dispense Refill  . albuterol (PROVENTIL HFA;VENTOLIN HFA) 108 (90 Base) MCG/ACT inhaler Inhale 2 puffs into the lungs every 6 (six) hours as needed for wheezing or shortness of breath. 1 Inhaler 6  . amLODipine (NORVASC) 10 MG tablet Take 1 tablet (10 mg total) by mouth daily. 90 tablet 3  . benzonatate (TESSALON) 200 MG capsule Take 1 capsule (200 mg total) by mouth 3 (three) times daily as needed for cough. 90 capsule 1  . Blood Pressure Monitor DEVI Use as directed to check home  blood pressure 2-3 times a week (Patient not taking: Reported on 05/14/2019) 1 Device 0  . busPIRone (BUSPAR) 10 MG tablet Take 1 tablet (10 mg total) by mouth 2 (two) times daily. 60 tablet 3  . Cholecalciferol (VITAMIN D3) 1000 units CAPS Take 1,000 Units by mouth daily.     [name] diltiazem (CARTIA XT) 240 MG 24 hr capsule Take 1 capsule (240 mg total) by mouth daily. 30 capsule 3  .  ergocalciferol (DRISDOL) 1.25 MG (50000 UT) capsule Take 1 capsule (50,000 Units total) by mouth once a week. (Patient not taking: Reported on 05/14/2019) 9 capsule 1  . escitalopram (LEXAPRO) 20 MG tablet Take 1 tablet (20 mg total) by mouth daily. 30 tablet 3  . fluticasone (FLONASE) 50 MCG/ACT nasal spray Place 2 sprays into both nostrils daily. 16 g 0  . furosemide (LASIX) 20 MG tablet Take 1 tablet (20 mg total) by mouth daily as needed for edema. 30 tablet 3  . gabapentin (NEURONTIN) 300 MG capsule TAKE 1 CAPSULE BY MOUTH 2 TIMES DAILY. 60 capsule 3  . hydrALAZINE (APRESOLINE) 50 MG tablet Take 1 tablet (50 mg total) by mouth 3 (three) times daily. 90 tablet 3  . HYDROcodone-homatropine (HYCODAN) 5-1.5 MG/5ML syrup Take 5 mLs by mouth at bedtime as needed for cough. (Patient not taking: Reported on 05/14/2019) 80 mL 0  . hydroxypropyl methylcellulose / hypromellose (ISOPTO TEARS / GONIOVISC) 2.5 % ophthalmic solution Place 1 drop into both eyes 3 (three) times daily as needed for dry eyes.    . insulin aspart (NOVOLOG) 100 UNIT/ML injection USE AS DIRECTED IN INSULIN PUMP 10 mL 2  . Insulin Pen Needle 32G X 4 MM MISC Used to give insulin injections four times daily. 200 each 11  . Insulin Syringe-Needle U-100 (BD INSULIN SYRINGE ULTRAFINE) 31G X 15/64" 1 ML MISC Used to give daily insulin injections. 100 each 11  . levothyroxine (SYNTHROID) 175 MCG tablet TAKE 1 TABLET BY MOUTH DAILY BEFORE BREAKFAST. 30 tablet 3  . loratadine (CLARITIN) 10 MG tablet 1 tab every 24-48 hours PRN 30 tablet 2  . losartan (COZAAR) 50 MG tablet Take by mouth.    . metoCLOPramide (REGLAN) 5 MG tablet Take 1 tablet (5 mg total) by mouth 3 (three) times daily as needed for nausea or vomiting. 60 tablet 4  . pantoprazole (PROTONIX) 40 MG tablet Take 1 tablet (40 mg total) by mouth daily. 30 tablet 3  . patiromer (VELTASSA) 8.4 g packet Take 8.4 g by mouth daily.     . pravastatin (PRAVACHOL) 20 MG tablet Take 1 tablet  (20 mg total) by mouth daily. 30 tablet 6  . sodium bicarbonate 650 MG tablet Take 1 tablet (650 mg total) by mouth 2 (two) times daily. 60 tablet 4  . TRESIBA FLEXTOUCH 100 UNIT/ML SOPN FlexTouch Pen INJECT 40 UNITS ONCE DAILY WITH ADDITONAL AS NEEDED. MAX DAILY DOSE OF 50 UNITS  3   No current facility-administered medications on file prior to visit.     Allergies  Allergen Reactions  . Ferumoxytol Itching  . Lisinopril Other (See Comments)    hyperkalemia  . Sulfamethoxazole Hives and Itching  . Trimethoprim Hives    Social History   Socioeconomic History  . Marital status: Divorced    Spouse name: Not on file  . Number of children: 0  . Years of education: 10th grade  . Highest education level: Not on file  Occupational History  . Occupation: unemployed    Comment:  worked at a group  Social Needs  . Financial resource strain: Not on file  . Food insecurity    Worry: Not on file    Inability: Not on file  . Transportation needs    Medical: Not on file    Non-medical: Not on file  Tobacco Use  . Smoking status: Former Smoker    Packs/day: 0.25    Years: 2.00    Pack years: 0.50    Types: Cigarettes    Quit date: 03/04/2013    Years since quitting: 6.4  . Smokeless tobacco: Never Used  Substance and Sexual Activity  . Alcohol use: No    Alcohol/week: 0.0 standard drinks  . Drug use: Yes    Types: Marijuana    Comment: last used 4 months ago  . Sexual activity: Yes    Partners: Female    Birth control/protection: None    Comment: women preference   Lifestyle  . Physical activity    Days per week: Not on file    Minutes per session: Not on file  . Stress: Not on file  Relationships  . Social Herbalist on phone: Not on file    Gets together: Not on file    Attends religious service: Not on file    Active member of club or organization: Not on file    Attends meetings of clubs or organizations: Not on file    Relationship status: Not on file   . Intimate partner violence    Fear of current or ex partner: Not on file    Emotionally abused: Not on file    Physically abused: Not on file    Forced sexual activity: Not on file  Other Topics Concern  . Not on file  Social History Narrative   Occupation: currently unemployed   Single   Homosexual,     Lives with mom.    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            Family History  Problem Relation Age of Onset  . Multiple sclerosis Mother   . Hypothyroidism Mother   . Stroke Mother        at age 24 yo  . Hyperlipidemia Maternal Grandmother   . Hypertension Maternal Grandmother   . Heart disease Maternal Grandmother        unknown type  . Diabetes Maternal Grandmother   . Hypertension Maternal Grandfather   . Prostate cancer Maternal Grandfather   . Diabetes type I Maternal Grandfather   . Breast cancer Paternal Grandmother   . Cancer Neg Hx     Past Surgical History:  Procedure Laterality Date  . FOOT FUSION Right 2006   "put screws in it too" (09/19/2013)    ROS: Review of Systems Negative except as stated above  PHYSICAL EXAM: BP (!) 167/111   Pulse 95   Temp 98.5 F (36.9 C) (Oral)   Resp 16   Wt 145 lb (65.8 kg)   SpO2 100%   BMI 24.13 kg/m   Physical Exam General appearance - alert, well appearing, and in no  distress Mental status - normal mood, behavior, speech, dress, motor activity, and thought processes Mouth - mucous membranes moist, pharynx normal without lesions Neck - supple, no significant adenopathy Chest - clear to auscultation, no wheezes, rales or rhonchi, symmetric air entry Heart - normal rate, regular rhythm, normal  S1, S2, no murmurs, rubs, clicks or gallops Extremities -no LE edema Diabetic Foot Exam - Simple   Simple Foot Form Visual Inspection No deformities, no ulcerations, no other skin breakdown bilaterally: Yes Sensation Testing See comments: Yes Pulse Check Posterior Tibialis and Dorsalis pulse intact bilaterally: Yes Comments Decreased sensation on deep exam    Results for orders placed or performed in visit on 08/01/19  POCT glucose (manual entry)  Result Value Ref Range   POC Glucose 240 (A) 70 - 99 mg/dl  POCT glycosylated hemoglobin (Hb A1C)  Result Value Ref Range   Hemoglobin A1C     HbA1c POC (<> result, manual entry)     HbA1c, POC (prediabetic range)     HbA1c, POC (controlled diabetic range) 10.5 (A) 0.0 - 7.0 %     CMP Latest Ref Rng & Units 05/14/2019 04/30/2019 11/21/2018  Glucose 65 - 99 mg/dL 133(H) 227(H) 145(H)  BUN 6 - 20 mg/dL 36(H) 32(H) 21(H)  Creatinine 0.57 - 1.00 mg/dL 3.10(H) 3.23(H) 2.62(H)  Sodium 134 - 144 mmol/L 136 136 136  Potassium 3.5 - 5.2 mmol/L 5.7(H) 5.6(H) 4.1  Chloride 96 - 106 mmol/L 101 102 102  CO2 20 - 29 mmol/L 20 19(L) 27  Calcium 8.7 - 10.2 mg/dL 9.8 9.9 10.0  Total Protein 6.0 - 8.5 g/dL - 6.7 6.8  Total Bilirubin 0.0 - 1.2 mg/dL - 0.3 0.7  Alkaline Phos 39 - 117 IU/L - 103 64  AST 0 - 40 IU/L - 17 15  ALT 0 - 32 IU/L - 12 11   Lipid Panel     Component Value Date/Time   CHOL 254 (H) 06/23/2017 1151   TRIG 125 06/23/2017 1151   HDL 87 06/23/2017 1151   CHOLHDL 2.9 06/23/2017 1151   CHOLHDL 3.8 02/04/2016 1703   VLDL 71 (H) 02/04/2016 1703   LDLCALC 142 (H) 06/23/2017 1151    CBC    Component Value Date/Time   WBC 4.7 04/30/2019 1141   WBC 4.9 11/21/2018 1931   RBC 3.58 (L) 04/30/2019 1141   RBC 3.52 (L) 11/21/2018 1931   HGB 9.6 (L) 04/30/2019 1141   HCT 31.4 (L) 04/30/2019 1141   PLT 155 04/30/2019 1141   MCV 88 04/30/2019 1141   MCH 26.8 04/30/2019 1141   MCH 27.6 11/21/2018 1931   MCHC 30.6 (L) 04/30/2019  1141   MCHC 30.6 11/21/2018 1931   RDW 11.2 (L) 04/30/2019 1141   LYMPHSABS 2.3 05/21/2018 1707   MONOABS 0.3 03/01/2018 1421   EOSABS 0.0 05/21/2018 1707   BASOSABS 0.0 05/21/2018 1707    ASSESSMENT AND PLAN: 1. 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, with long-term current use of insulin ([place]) Advised patient to get back in with the endocrinologist as soon as possible for adjustment of insulin as I have very little experience with the insulin pump.  I have sent a message to our referral coordinator to facilitate this.  Healthy eating habits encouraged. - POCT glucose (manual entry) - POCT glycosylated hemoglobin (Hb A1C) - Microalbumin / creatinine urine ratio - CBC - Comprehensive metabolic panel - Ambulatory referral to Ophthalmology - Ambulatory referral to Endocrinology  2. Essential hypertension Not at goal.  Increase hydralazine to 75 mg 3 times a day - hydrALAZINE (APRESOLINE) 50 MG tablet; Take 1.5 tablets (75 mg total) by mouth 3 (three) times daily.  Dispense: 135 tablet; Refill: 6  3.  CKD stage 4 Being followed by nephrology at [facility].  4. Anemia in stage 4 chronic kidney disease ([place]) Gets iron infusion intermittently through her nephrologist  5. Menorrhagia with irregular cycle - Ambulatory referral to Gynecology    Patient was given the opportunity to ask questions.  Patient verbalized understanding of the plan and was able to repeat key elements of the plan.   Orders Placed This Encounter  Procedures  . Microalbumin / creatinine urine ratio  . POCT glucose (manual entry)  . POCT glycosylated hemoglobin (Hb A1C)     Requested Prescriptions    No prescriptions requested or ordered in this encounter    No follow-ups on file.  Karle Plumber, MD, FACP

## 2019-08-01 NOTE — Patient Instructions (Signed)
We have increase hydralazine to 75 mg 3 times a day.  I have submitted referrals for you to see the eye doctor, the gynecologist on for follow-up with the endocrinologist.

## 2019-08-02 DIAGNOSIS — E109 Type 1 diabetes mellitus without complications: Secondary | ICD-10-CM | POA: Diagnosis not present

## 2019-08-09 ENCOUNTER — Other Ambulatory Visit: Payer: Self-pay

## 2019-08-09 ENCOUNTER — Ambulatory Visit: Payer: Medicaid Other | Attending: Family Medicine

## 2019-08-09 DIAGNOSIS — E1029 Type 1 diabetes mellitus with other diabetic kidney complication: Secondary | ICD-10-CM | POA: Diagnosis not present

## 2019-08-10 LAB — COMPREHENSIVE METABOLIC PANEL
ALT: 12 IU/L (ref 0–32)
AST: 19 IU/L (ref 0–40)
Albumin/Globulin Ratio: 1.5 (ref 1.2–2.2)
Albumin: 3.7 g/dL — ABNORMAL LOW (ref 3.8–4.8)
Alkaline Phosphatase: 83 IU/L (ref 39–117)
BUN/Creatinine Ratio: 9 (ref 9–23)
BUN: 34 mg/dL — ABNORMAL HIGH (ref 6–20)
Bilirubin Total: 0.3 mg/dL (ref 0.0–1.2)
CO2: 22 mmol/L (ref 20–29)
Calcium: 9.5 mg/dL (ref 8.7–10.2)
Chloride: 99 mmol/L (ref 96–106)
Creatinine, Ser: 3.76 mg/dL — ABNORMAL HIGH (ref 0.57–1.00)
GFR calc Af Amer: 17 mL/min/{1.73_m2} — ABNORMAL LOW (ref >59)
GFR calc non Af Amer: 15 mL/min/{1.73_m2} — ABNORMAL LOW (ref >59)
Globulin, Total: 2.4 g/dL (ref 1.5–4.5)
Glucose: 268 mg/dL — ABNORMAL HIGH (ref 65–99)
Potassium: 5 mmol/L (ref 3.5–5.2)
Sodium: 134 mmol/L (ref 134–144)
Total Protein: 6.1 g/dL (ref 6.0–8.5)

## 2019-08-10 LAB — CBC
Hematocrit: 27.7 % — ABNORMAL LOW (ref 34.0–46.6)
Hemoglobin: 8.7 g/dL — ABNORMAL LOW (ref 11.1–15.9)
MCH: 27.7 pg (ref 26.6–33.0)
MCHC: 31.4 g/dL — ABNORMAL LOW (ref 31.5–35.7)
MCV: 88 fL (ref 79–97)
Platelets: 161 10*3/uL (ref 150–450)
RBC: 3.14 x10E6/uL — ABNORMAL LOW (ref 3.77–5.28)
RDW: 11.6 % — ABNORMAL LOW (ref 11.7–15.4)
WBC: 6.1 10*3/uL (ref 3.4–10.8)

## 2019-08-10 LAB — MICROALBUMIN / CREATININE URINE RATIO
Creatinine, Urine: 109.8 mg/dL
Microalb/Creat Ratio: 659 mg/g creat — ABNORMAL HIGH (ref 0–29)
Microalbumin, Urine: 723.5 ug/mL

## 2019-08-13 ENCOUNTER — Ambulatory Visit: Payer: Medicaid Other | Admitting: Advanced Practice Midwife

## 2019-08-13 ENCOUNTER — Encounter: Payer: Self-pay | Admitting: Advanced Practice Midwife

## 2019-08-13 ENCOUNTER — Other Ambulatory Visit: Payer: Self-pay

## 2019-08-13 VITALS — BP 158/98 | HR 85 | Wt 148.8 lb

## 2019-08-13 DIAGNOSIS — Z30432 Encounter for removal of intrauterine contraceptive device: Secondary | ICD-10-CM | POA: Diagnosis not present

## 2019-08-13 DIAGNOSIS — N939 Abnormal uterine and vaginal bleeding, unspecified: Secondary | ICD-10-CM | POA: Diagnosis not present

## 2019-08-13 NOTE — Progress Notes (Signed)
  GYNECOLOGY PROGRESS NOTE  History:  33 y.o. G0 with medical hx significant for Type 1 DM with CKD and retinopathy, HTN, depression, hypothyroid presents to [place] office today for problem gyn visit. She reports painful cramping and continued AUB with IUD.  She had IUD placed 01/03/19 for AUB. She does not need contraception since she is a same sex relationship.  She desires removal of the IUD.    The following portions of the patient's history were reviewed and updated as appropriate: allergies, current medications, past family history, past medical history, past social history, past surgical history and problem list. Last pap smear on 02/19/2018 was normal.  Review of Systems:  Pertinent items are noted in HPI.   Objective:  Physical Exam Blood pressure (!) 158/98, pulse 85, weight 148 lb 12.8 oz (67.5 kg). VS reviewed, nursing note reviewed,  Constitutional: well developed, well nourished, no distress HEENT: normocephalic CV: normal rate Pulm/chest wall: normal effort Breast Exam: deferred Abdomen: soft Neuro: alert and oriented x 3 Skin: warm, dry Psych: affect normal Pelvic exam: Cervix pink, visually closed, without lesion, scant white creamy discharge, vaginal walls and external genitalia normal Bimanual exam: Cervix 0/long/high, firm, anterior, neg CMT, uterus nontender, nonenlarged, adnexa without tenderness, enlargement, or mass  IUD Removal  Patient was in the dorsal lithotomy position, normal external genitalia was noted.  A speculum was placed in the patient's vagina, normal discharge was noted, no lesions. The multiparous cervix was visualized, no lesions, no abnormal discharge.  The strings of the IUD were grasped and pulled using ring forceps. The IUD was removed in its entirety. Patient tolerated the procedure well.    Patient does not need contraception since she is in a same sex relationship.  Routine preventative health maintenance measures  emphasized.  Assessment & Plan:  1. Abnormal uterine bleeding (AUB) --AUB continues with IUD in place since 01/03/19.  Recent bleeding episode x 3 weeks heavy with clots and cramping, light bleeding today.  --Pt receives IV iron infusions managed by hematology --Pt does not desire pregnancy and is aware that pregnancy not recommended with her long term health conditions --Recommend f/u for AUB with surgical intervention --Next appt with MD for preop visit/counseling  2. Encounter for IUD removal --IUD removed without difficulty, pt tolerated well --F/U with MD to discussed AUB management  Fatima Blank, CNM 2:31 PM

## 2019-08-13 NOTE — Patient Instructions (Signed)
Abnormal Uterine Bleeding Abnormal uterine bleeding is unusual bleeding from the uterus. It includes:  Bleeding or spotting between periods.  Bleeding after sex.  Bleeding that is heavier than normal.  Periods that last longer than usual.  Bleeding after menopause. Abnormal uterine bleeding can affect women at various stages in life, including teenagers, women in their reproductive years, pregnant women, and women who have reached menopause. Common causes of abnormal uterine bleeding include:  Pregnancy.  Growths of tissue (polyps).  A noncancerous tumor in the uterus (fibroid).  Infection.  Cancer.  Hormonal imbalances. Any type of abnormal bleeding should be evaluated by a health care provider. Many cases are minor and simple to treat, while others are more serious. Treatment will depend on the cause of the bleeding. Follow these instructions at home:  Monitor your condition for any changes.  Do not use tampons, douche, or have sex if told by your health care provider.  Change your pads often.  Get regular exams that include pelvic exams and cervical cancer screening.  Keep all follow-up visits as told by your health care provider. This is important. Contact a health care provider if:  Your bleeding lasts for more than one week.  You feel dizzy at times.  You feel nauseous or you vomit. Get help right away if:  You pass out.  Your bleeding soaks through a pad every hour.  You have abdominal pain.  You have a fever.  You become sweaty or weak.  You pass large blood clots from your vagina. Summary  Abnormal uterine bleeding is unusual bleeding from the uterus.  Any type of abnormal bleeding should be evaluated by a health care provider. Many cases are minor and simple to treat, while others are more serious.  Treatment will depend on the cause of the bleeding. This information is not intended to replace advice given to you by your health care provider.  Make sure you discuss any questions you have with your health care provider. Document Released: 12/05/2005 Document Revised: 03/14/2018 Document Reviewed: 01/06/2017 Elsevier Patient Education  2020 [employer]  

## 2019-08-13 NOTE — Progress Notes (Signed)
Pt is here for IUD check. Mirena inserted 01/03/19. Pt has c/o bleeding and pain for 3 weeks.

## 2019-08-16 ENCOUNTER — Other Ambulatory Visit: Payer: Self-pay

## 2019-08-16 ENCOUNTER — Emergency Department (HOSPITAL_COMMUNITY): Payer: Medicaid Other

## 2019-08-16 ENCOUNTER — Emergency Department (HOSPITAL_COMMUNITY)
Admission: EM | Admit: 2019-08-16 | Discharge: 2019-08-16 | Disposition: A | Discharge status: Home / Self Care | Payer: Medicaid Other | Attending: Emergency Medicine | Admitting: Emergency Medicine

## 2019-08-16 ENCOUNTER — Encounter (HOSPITAL_COMMUNITY): Payer: Self-pay | Admitting: Emergency Medicine

## 2019-08-16 DIAGNOSIS — Z87891 Personal history of nicotine dependence: Secondary | ICD-10-CM | POA: Insufficient documentation

## 2019-08-16 DIAGNOSIS — E039 Hypothyroidism, unspecified: Secondary | ICD-10-CM | POA: Diagnosis not present

## 2019-08-16 DIAGNOSIS — S0993XA Unspecified injury of face, initial encounter: Secondary | ICD-10-CM | POA: Diagnosis not present

## 2019-08-16 DIAGNOSIS — R52 Pain, unspecified: Secondary | ICD-10-CM | POA: Diagnosis not present

## 2019-08-16 DIAGNOSIS — Z794 Long term (current) use of insulin: Secondary | ICD-10-CM | POA: Insufficient documentation

## 2019-08-16 DIAGNOSIS — S0990XA Unspecified injury of head, initial encounter: Secondary | ICD-10-CM | POA: Diagnosis not present

## 2019-08-16 DIAGNOSIS — M546 Pain in thoracic spine: Secondary | ICD-10-CM | POA: Diagnosis not present

## 2019-08-16 DIAGNOSIS — S2241XA Multiple fractures of ribs, right side, initial encounter for closed fracture: Secondary | ICD-10-CM

## 2019-08-16 DIAGNOSIS — S2231XA Fracture of one rib, right side, initial encounter for closed fracture: Secondary | ICD-10-CM | POA: Diagnosis not present

## 2019-08-16 DIAGNOSIS — E161 Other hypoglycemia: Secondary | ICD-10-CM | POA: Diagnosis not present

## 2019-08-16 DIAGNOSIS — N183 Chronic kidney disease, stage 3 (moderate): Secondary | ICD-10-CM | POA: Diagnosis not present

## 2019-08-16 DIAGNOSIS — E162 Hypoglycemia, unspecified: Secondary | ICD-10-CM

## 2019-08-16 DIAGNOSIS — R51 Headache: Secondary | ICD-10-CM | POA: Diagnosis present

## 2019-08-16 DIAGNOSIS — Z79899 Other long term (current) drug therapy: Secondary | ICD-10-CM | POA: Insufficient documentation

## 2019-08-16 DIAGNOSIS — T7411XA Adult physical abuse, confirmed, initial encounter: Secondary | ICD-10-CM | POA: Diagnosis not present

## 2019-08-16 DIAGNOSIS — S299XXA Unspecified injury of thorax, initial encounter: Secondary | ICD-10-CM | POA: Diagnosis not present

## 2019-08-16 DIAGNOSIS — R0789 Other chest pain: Secondary | ICD-10-CM | POA: Diagnosis not present

## 2019-08-16 DIAGNOSIS — E10649 Type 1 diabetes mellitus with hypoglycemia without coma: Secondary | ICD-10-CM | POA: Insufficient documentation

## 2019-08-16 DIAGNOSIS — R58 Hemorrhage, not elsewhere classified: Secondary | ICD-10-CM | POA: Diagnosis not present

## 2019-08-16 DIAGNOSIS — I129 Hypertensive chronic kidney disease with stage 1 through stage 4 chronic kidney disease, or unspecified chronic kidney disease: Secondary | ICD-10-CM | POA: Insufficient documentation

## 2019-08-16 DIAGNOSIS — R0781 Pleurodynia: Secondary | ICD-10-CM | POA: Diagnosis not present

## 2019-08-16 LAB — CBG MONITORING, ED
Glucose-Capillary: 67 mg/dL — ABNORMAL LOW (ref 70–99)
Glucose-Capillary: 45 mg/dL — ABNORMAL LOW (ref 70–99)
Glucose-Capillary: 77 mg/dL (ref 70–99)
Glucose-Capillary: 211 mg/dL — ABNORMAL HIGH (ref 70–99)
Glucose-Capillary: 75 mg/dL (ref 70–99)
Glucose-Capillary: 198 mg/dL — ABNORMAL HIGH (ref 70–99)

## 2019-08-16 LAB — BASIC METABOLIC PANEL
Anion gap: 7 (ref 5–15)
BUN: 36 mg/dL — ABNORMAL HIGH (ref 6–20)
CO2: 25 mmol/L (ref 22–32)
Calcium: 10 mg/dL (ref 8.9–10.3)
Chloride: 105 mmol/L (ref 98–111)
Creatinine, Ser: 3.64 mg/dL — ABNORMAL HIGH (ref 0.44–1.00)
GFR calc Af Amer: 18 mL/min — ABNORMAL LOW (ref >60)
GFR calc non Af Amer: 15 mL/min — ABNORMAL LOW (ref >60)
Glucose, Bld: 82 mg/dL (ref 70–99)
Potassium: 4.4 mmol/L (ref 3.5–5.1)
Sodium: 137 mmol/L (ref 135–145)

## 2019-08-16 LAB — I-STAT BETA HCG BLOOD, ED (MC, WL, AP ONLY): I-stat hCG, quantitative: <5 m[IU]/mL (ref <5)

## 2019-08-16 MED ORDER — OXYCODONE-ACETAMINOPHEN 5-325 MG PO TABS
1.0000 | ORAL_TABLET | Freq: Once | ORAL | Status: AC
  Administered 2019-08-16: 06:00:00 1 via ORAL
  Filled 2019-08-16: qty 1

## 2019-08-16 MED ORDER — HYDROMORPHONE HCL 1 MG/ML IJ SOLN
1.0000 mg | Freq: Once | INTRAMUSCULAR | Status: AC
  Administered 2019-08-16: 06:00:00 1 mg via INTRAVENOUS
  Filled 2019-08-16: qty 1

## 2019-08-16 MED ORDER — ONDANSETRON HCL 4 MG/2ML IJ SOLN
4.0000 mg | Freq: Once | INTRAMUSCULAR | Status: AC
  Administered 2019-08-16: 06:00:00 4 mg via INTRAVENOUS
  Filled 2019-08-16: qty 2

## 2019-08-16 MED ORDER — DEXTROSE 50 % IV SOLN: 50.0000 mL | Freq: Once | INTRAVENOUS | Status: DC

## 2019-08-16 MED ORDER — SODIUM CHLORIDE 0.9% FLUSH: 3.0000 mL | Freq: Two times a day (BID) | INTRAVENOUS | Status: DC

## 2019-08-16 MED ORDER — SODIUM CHLORIDE 0.9 % IV SOLN: 250.0000 mL | INTRAVENOUS | Status: DC | PRN

## 2019-08-16 MED ORDER — DEXTROSE 50 % IV SOLN
1.0000 | Freq: Once | INTRAVENOUS | Status: DC
  Administered 2019-08-16: 50 mL via INTRAVENOUS

## 2019-08-16 MED ORDER — SODIUM CHLORIDE 0.9% FLUSH
3.0000 mL | INTRAVENOUS | Status: DC | PRN
  Administered 2019-08-16 (×2): 3 mL via INTRAVENOUS
  Filled 2019-08-16 (×2): qty 3

## 2019-08-16 MED ORDER — LIDOCAINE 5 % EX PTCH: 1.0000 | MEDICATED_PATCH | CUTANEOUS | 0 refills | Status: DC

## 2019-08-16 MED ORDER — HYDROMORPHONE HCL 1 MG/ML IJ SOLN
0.5000 mg | Freq: Once | INTRAMUSCULAR | Status: AC
  Administered 2019-08-16: 02:00:00 0.5 mg via INTRAMUSCULAR
  Filled 2019-08-16: qty 1

## 2019-08-16 MED ORDER — LIDOCAINE 5 % EX PTCH
1.0000 | MEDICATED_PATCH | CUTANEOUS | Status: DC
  Administered 2019-08-16: 04:00:00 1 via TRANSDERMAL
  Filled 2019-08-16: qty 1

## 2019-08-16 MED ORDER — OXYCODONE-ACETAMINOPHEN 5-325 MG PO TABS: ORAL_TABLET | ORAL | 0 refills | Status: DC

## 2019-08-16 MED ORDER — HYDROMORPHONE HCL 1 MG/ML IJ SOLN
0.5000 mg | Freq: Once | INTRAMUSCULAR | Status: AC
  Administered 2019-08-16: 04:00:00 0.5 mg via INTRAVENOUS
  Filled 2019-08-16: qty 1
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
## 2019-08-16 NOTE — ED Notes (Signed)
Pt. CBG 77, RN, Katie made aware.

## 2019-08-16 NOTE — Discharge Instructions (Signed)
Thank you for allowing me to care for you today in the Emergency Department.   Please follow-up with your endocrinologist regarding your insulin pump and your high and low blood sugars that you have been experiencing, particularly the low blood sugars like you had tonight in the ER.  For pain control, you can take 650 mg of Tylenol once every 6 hours.  You can also take 1 tablet of Percocet every 4-6 hours for severe pain.  As you know, you should avoid NSAIDs because of your kidneys.  Each tablet of Percocet contains 325 mg of Tylenol.  Do not take more than 4000 mg of Tylenol from all sources in a 24-hour period.  You can apply 1 lidocaine patch to areas that are sore for additional pain control or heat or ice packs as needed.  Follow-up with primary care if you continue to have pain control concerns.  Rib fractures can take about 6 to 8 weeks to heal, sometimes longer.  You can place a pillow under your right side when you are sleeping or to help brace yourself if you have coughing episodes.  Use the incentive spirometer as directed to help prevent pneumonia.  Return to the ER if your blood sugar is very low, if you develop severe shortness of breath, high fevers, chills, productive cough, if you feel unsafe at home, other new, concerning symptoms.
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
## 2019-08-16 NOTE — ED Notes (Signed)
Breakfast tray provided. 

## 2019-08-16 NOTE — ED Notes (Signed)
Pt. Documented in error see above note in chart. 

## 2019-08-16 NOTE — ED Notes (Signed)
Pt d/c home per MD order. Discharge summary reviewed, pt verbalizes understanding. Off unit via [place]- Reports her brother is here for discharge ride home.

## 2019-08-16 NOTE — ED Notes (Addendum)
Gave pt 2 0.75g pnut butter packets, 3 packet graham crackers, and 8oz apple juice.

## 2019-08-16 NOTE — ED Notes (Signed)
Talked to pharmacy regarding barcode on D50 not scanning. Pharmacy said to verify with another RN that the rx is 1 amp D50. Verified rx with charge Delsa Sale.

## 2019-08-16 NOTE — ED Notes (Signed)
Called pharm to send dextrose. It's unavailable from the pyxis.

## 2019-08-16 NOTE — ED Notes (Signed)
Pt. CBG 45, RN,Katie made aware.

## 2019-08-16 NOTE — ED Notes (Signed)
Gave pt 8oz apple juice, cheese stick, and sandwich. Encouraged her to eat and drink asap for imaging

## 2019-08-18 ENCOUNTER — Other Ambulatory Visit: Payer: Self-pay

## 2019-08-18 ENCOUNTER — Emergency Department (HOSPITAL_COMMUNITY): Payer: Medicaid Other

## 2019-08-18 ENCOUNTER — Emergency Department (HOSPITAL_COMMUNITY)
Admission: EM | Admit: 2019-08-18 | Discharge: 2019-08-19 | Disposition: A | Discharge status: Home / Self Care | Payer: Medicaid Other | Attending: Emergency Medicine | Admitting: Emergency Medicine

## 2019-08-18 ENCOUNTER — Encounter (HOSPITAL_COMMUNITY): Payer: Self-pay

## 2019-08-18 DIAGNOSIS — E1043 Type 1 diabetes mellitus with diabetic autonomic (poly)neuropathy: Secondary | ICD-10-CM | POA: Diagnosis not present

## 2019-08-18 DIAGNOSIS — Z79899 Other long term (current) drug therapy: Secondary | ICD-10-CM | POA: Insufficient documentation

## 2019-08-18 DIAGNOSIS — I129 Hypertensive chronic kidney disease with stage 1 through stage 4 chronic kidney disease, or unspecified chronic kidney disease: Secondary | ICD-10-CM | POA: Diagnosis not present

## 2019-08-18 DIAGNOSIS — R0789 Other chest pain: Secondary | ICD-10-CM | POA: Insufficient documentation

## 2019-08-18 DIAGNOSIS — E1042 Type 1 diabetes mellitus with diabetic polyneuropathy: Secondary | ICD-10-CM | POA: Insufficient documentation

## 2019-08-18 DIAGNOSIS — N183 Chronic kidney disease, stage 3 (moderate): Secondary | ICD-10-CM | POA: Insufficient documentation

## 2019-08-18 DIAGNOSIS — E10319 Type 1 diabetes mellitus with unspecified diabetic retinopathy without macular edema: Secondary | ICD-10-CM | POA: Insufficient documentation

## 2019-08-18 DIAGNOSIS — E1022 Type 1 diabetes mellitus with diabetic chronic kidney disease: Secondary | ICD-10-CM | POA: Diagnosis not present

## 2019-08-18 DIAGNOSIS — J45909 Unspecified asthma, uncomplicated: Secondary | ICD-10-CM | POA: Diagnosis not present

## 2019-08-18 DIAGNOSIS — Z87891 Personal history of nicotine dependence: Secondary | ICD-10-CM | POA: Insufficient documentation

## 2019-08-18 DIAGNOSIS — R0781 Pleurodynia: Secondary | ICD-10-CM

## 2019-08-18 DIAGNOSIS — E039 Hypothyroidism, unspecified: Secondary | ICD-10-CM | POA: Insufficient documentation

## 2019-08-18 DIAGNOSIS — R0602 Shortness of breath: Secondary | ICD-10-CM | POA: Diagnosis not present

## 2019-08-18 LAB — CBG MONITORING, ED: Glucose-Capillary: 114 mg/dL — ABNORMAL HIGH (ref 70–99)
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
## 2019-08-19 LAB — CBG MONITORING, ED: Glucose-Capillary: 121 mg/dL — ABNORMAL HIGH (ref 70–99)

## 2019-08-19 MED ORDER — METHOCARBAMOL 500 MG PO TABS
500.0000 mg | ORAL_TABLET | Freq: Once | ORAL | Status: AC
  Administered 2019-08-19: 500 mg via ORAL
  Filled 2019-08-19: qty 1

## 2019-08-19 MED ORDER — HYDRALAZINE HCL 50 MG PO TABS
75.0000 mg | ORAL_TABLET | Freq: Once | ORAL | Status: AC
  Administered 2019-08-19: 01:00:00 75 mg via ORAL
  Filled 2019-08-19: qty 1

## 2019-08-19 MED ORDER — HYDROMORPHONE HCL 1 MG/ML IJ SOLN
1.0000 mg | Freq: Once | INTRAMUSCULAR | Status: AC
  Administered 2019-08-19: 1 mg via INTRAMUSCULAR
  Filled 2019-08-19: qty 1

## 2019-08-19 MED ORDER — METHOCARBAMOL 500 MG PO TABS: 500.0000 mg | ORAL_TABLET | Freq: Two times a day (BID) | ORAL | 0 refills | Status: DC

## 2019-08-19 NOTE — Discharge Instructions (Addendum)
1. Medications: Continue taking your oxycodone.  Add Robaxin to your regimen for control.  Take your home medications as directed. 2. Treatment: rest, drink plenty of fluids, continue to use your incentive spirometer 3. Follow Up: Please followup with your primary doctor in 2 days for follow-up of your hypertension.  Please return to the ER for difficulty breathing, high fevers or other concerns.
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
## 2019-08-19 NOTE — ED Notes (Signed)
Pt mother called stating pts blood sugar was 80. Pt was provided with apple juice and peanut butter crackers. Will recheck CBG.

## 2019-08-25 IMAGING — DX DG CHEST 2V
2 series · 2 of 2 positions shown · non-contrast
Comparison: 11/21/2018

CLINICAL DATA: Cough, fevers, chills, body aches

EXAM:
CHEST - 2 VIEW

[chest pa]
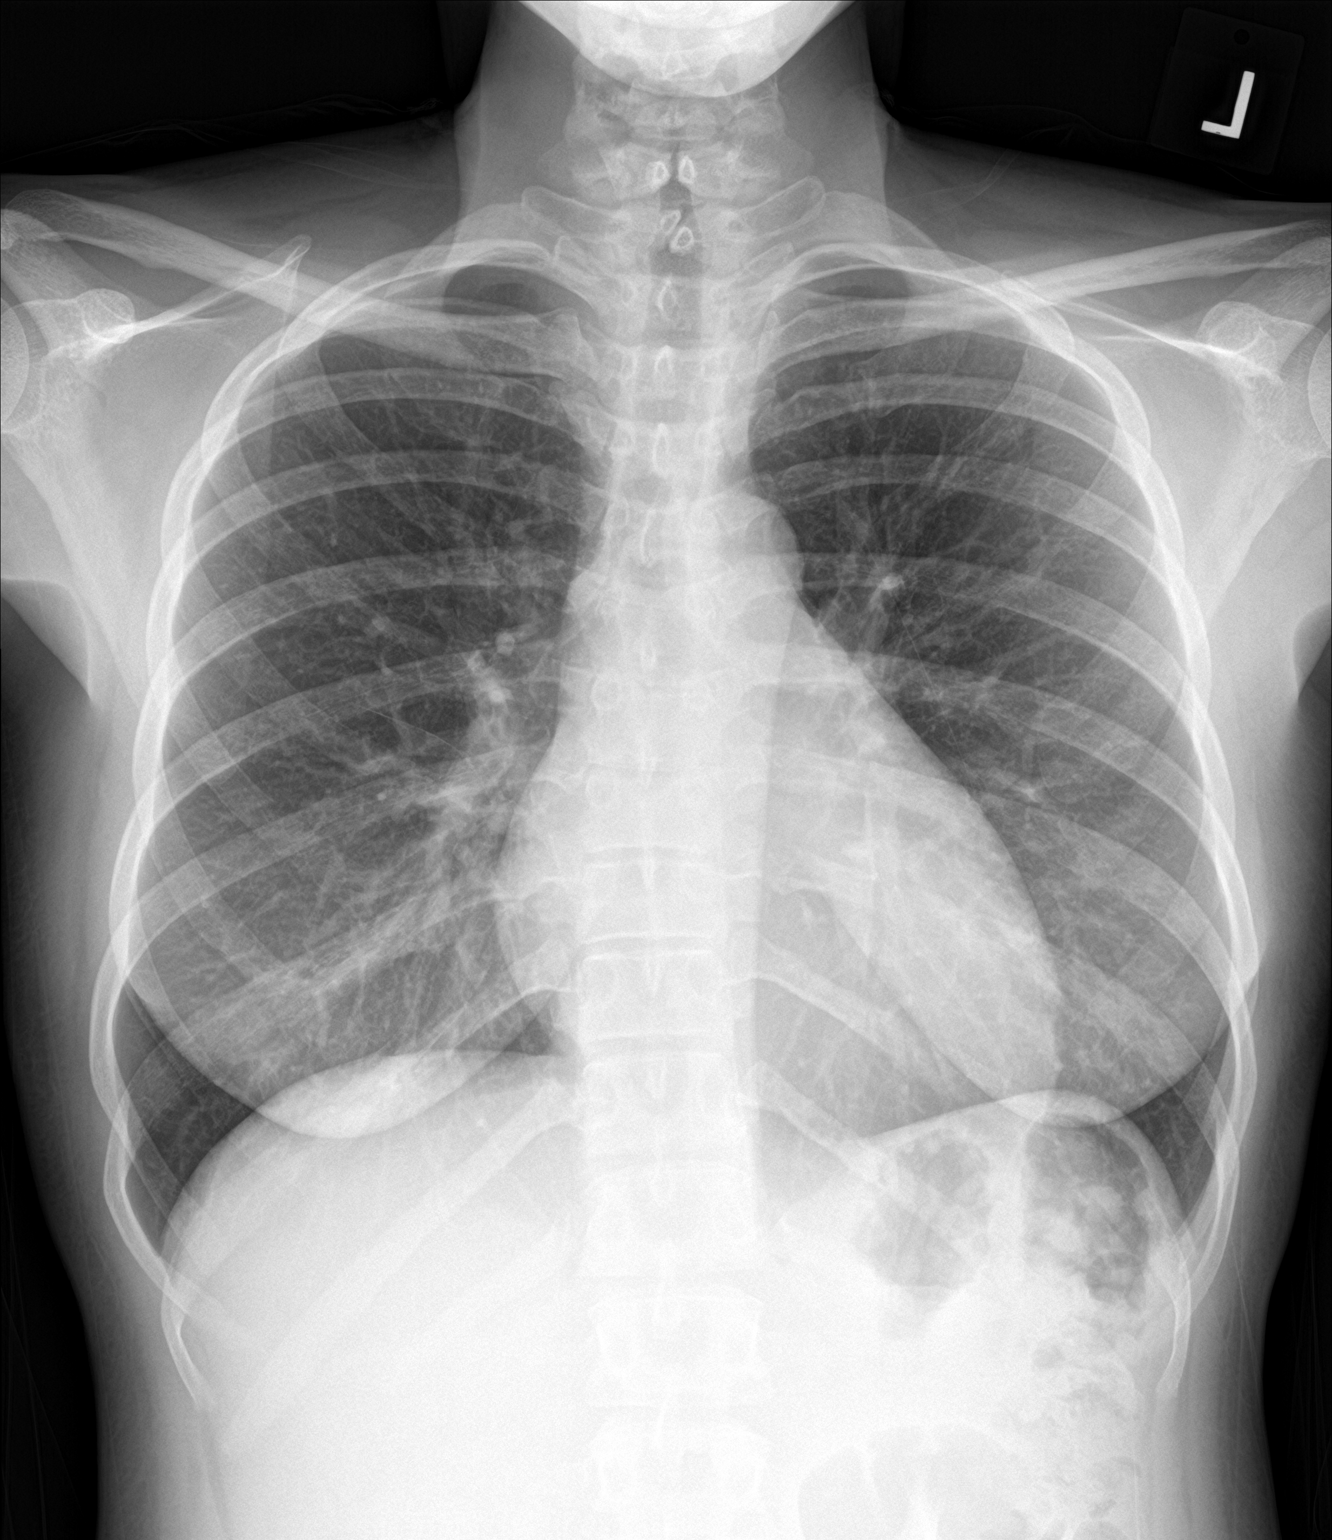

[chest lat]
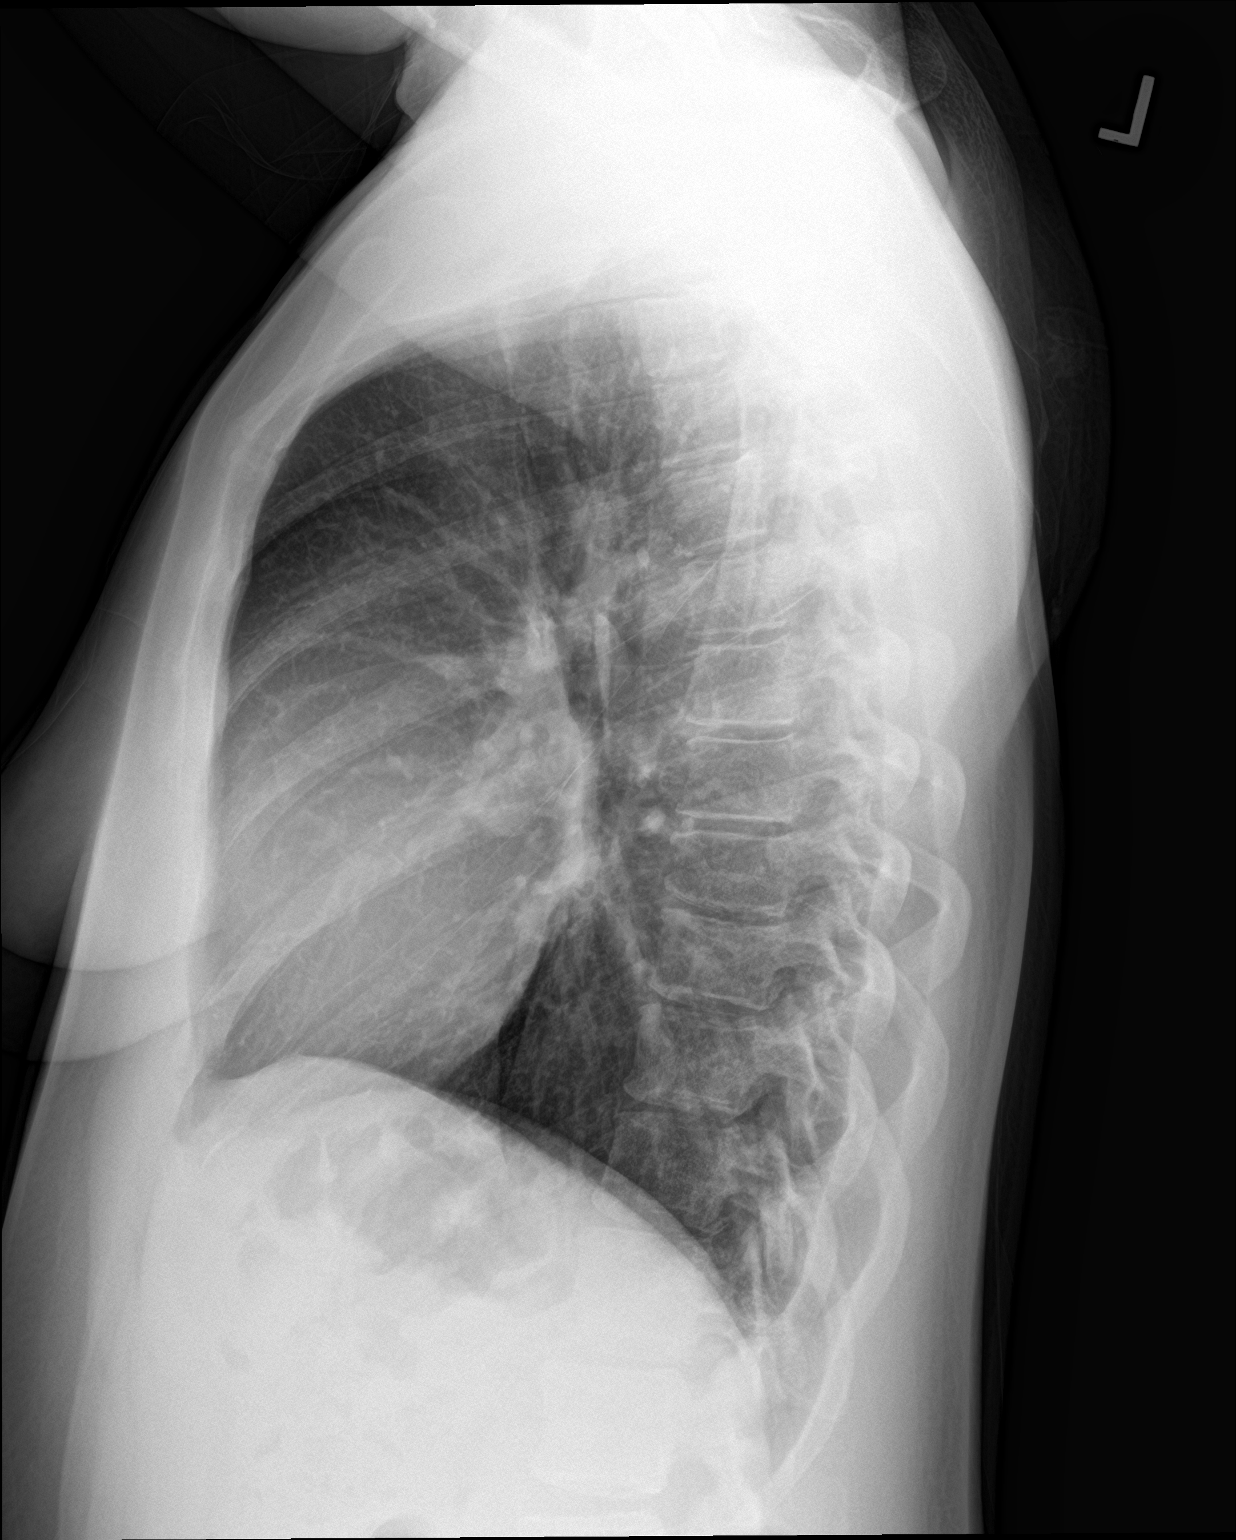

[2 of 2 positions shown; findings below may reference images not displayed]

FINDINGS: The heart size and mediastinal contours are within normal limits.
Both lungs are clear. The visualized skeletal structures are
unremarkable.
IMPRESSION: No acute abnormality of the lungs.  No focal airspace opacity.

## 2019-08-29 ENCOUNTER — Ambulatory Visit: Payer: Medicaid Other | Admitting: Family Medicine

## 2019-09-03 ENCOUNTER — Encounter: Payer: Self-pay | Admitting: Internal Medicine

## 2019-09-03 DIAGNOSIS — N184 Chronic kidney disease, stage 4 (severe): Secondary | ICD-10-CM | POA: Diagnosis not present

## 2019-09-03 DIAGNOSIS — N2581 Secondary hyperparathyroidism of renal origin: Secondary | ICD-10-CM | POA: Diagnosis not present

## 2019-09-03 DIAGNOSIS — N179 Acute kidney failure, unspecified: Secondary | ICD-10-CM | POA: Diagnosis not present

## 2019-09-03 DIAGNOSIS — E875 Hyperkalemia: Secondary | ICD-10-CM | POA: Diagnosis not present

## 2019-09-03 DIAGNOSIS — R3129 Other microscopic hematuria: Secondary | ICD-10-CM | POA: Diagnosis not present

## 2019-09-03 DIAGNOSIS — E1042 Type 1 diabetes mellitus with diabetic polyneuropathy: Secondary | ICD-10-CM | POA: Diagnosis not present

## 2019-09-03 DIAGNOSIS — Z79899 Other long term (current) drug therapy: Secondary | ICD-10-CM | POA: Diagnosis not present

## 2019-09-03 DIAGNOSIS — D509 Iron deficiency anemia, unspecified: Secondary | ICD-10-CM | POA: Diagnosis not present

## 2019-09-03 DIAGNOSIS — K3184 Gastroparesis: Secondary | ICD-10-CM | POA: Diagnosis not present

## 2019-09-03 DIAGNOSIS — E1043 Type 1 diabetes mellitus with diabetic autonomic (poly)neuropathy: Secondary | ICD-10-CM | POA: Diagnosis not present

## 2019-09-03 DIAGNOSIS — I129 Hypertensive chronic kidney disease with stage 1 through stage 4 chronic kidney disease, or unspecified chronic kidney disease: Secondary | ICD-10-CM | POA: Diagnosis not present

## 2019-09-03 DIAGNOSIS — D631 Anemia in chronic kidney disease: Secondary | ICD-10-CM | POA: Diagnosis not present

## 2019-09-03 DIAGNOSIS — E1022 Type 1 diabetes mellitus with diabetic chronic kidney disease: Secondary | ICD-10-CM | POA: Diagnosis not present

## 2019-09-03 DIAGNOSIS — Z9641 Presence of insulin pump (external) (internal): Secondary | ICD-10-CM | POA: Diagnosis not present

## 2019-09-03 DIAGNOSIS — E039 Hypothyroidism, unspecified: Secondary | ICD-10-CM | POA: Diagnosis not present

## 2019-09-03 DIAGNOSIS — E1021 Type 1 diabetes mellitus with diabetic nephropathy: Secondary | ICD-10-CM | POA: Diagnosis not present

## 2019-09-03 MED FILL — LOKELMA 10 GM PACK: 10 | 30 days supply | Qty: 30 | Fill #0

## 2019-09-03 NOTE — Telephone Encounter (Signed)
Please share results with patient and advice.

## 2019-09-04 ENCOUNTER — Ambulatory Visit: Payer: Medicaid Other | Admitting: Obstetrics & Gynecology

## 2019-09-06 DIAGNOSIS — E109 Type 1 diabetes mellitus without complications: Secondary | ICD-10-CM | POA: Diagnosis not present

## 2019-09-09 DIAGNOSIS — E1043 Type 1 diabetes mellitus with diabetic autonomic (poly)neuropathy: Secondary | ICD-10-CM | POA: Diagnosis not present

## 2019-09-09 DIAGNOSIS — N184 Chronic kidney disease, stage 4 (severe): Secondary | ICD-10-CM | POA: Diagnosis not present

## 2019-09-09 DIAGNOSIS — I120 Hypertensive chronic kidney disease with stage 5 chronic kidney disease or end stage renal disease: Secondary | ICD-10-CM | POA: Diagnosis not present

## 2019-09-09 DIAGNOSIS — Z79899 Other long term (current) drug therapy: Secondary | ICD-10-CM | POA: Diagnosis not present

## 2019-09-09 DIAGNOSIS — Z9641 Presence of insulin pump (external) (internal): Secondary | ICD-10-CM | POA: Diagnosis not present

## 2019-09-09 DIAGNOSIS — K3184 Gastroparesis: Secondary | ICD-10-CM | POA: Diagnosis not present

## 2019-09-09 DIAGNOSIS — E1040 Type 1 diabetes mellitus with diabetic neuropathy, unspecified: Secondary | ICD-10-CM | POA: Diagnosis not present

## 2019-09-09 DIAGNOSIS — Z794 Long term (current) use of insulin: Secondary | ICD-10-CM | POA: Diagnosis not present

## 2019-09-09 DIAGNOSIS — E1022 Type 1 diabetes mellitus with diabetic chronic kidney disease: Secondary | ICD-10-CM | POA: Diagnosis not present

## 2019-09-09 DIAGNOSIS — E039 Hypothyroidism, unspecified: Secondary | ICD-10-CM | POA: Diagnosis not present

## 2019-09-10 MED FILL — LEVOTHYROXINE 200 MCG TAB: 200 | 90 days supply | Qty: 90 | Fill #0

## 2019-09-18 MED FILL — busPIRone HCL 10 MG TABS: 10 | 30 days supply | Qty: 60 | Fill #1

## 2019-09-18 MED FILL — LOKELMA 10 GM PACK: 10 | 30 days supply | Qty: 30 | Fill #0

## 2019-09-18 MED FILL — ESCITALOPRAM 20 MG TABLET: 20 | 30 days supply | Qty: 30 | Fill #0

## 2019-09-18 MED FILL — hydrALAZINE HCL 100 MG TABS: 100 | 90 days supply | Qty: 180 | Fill #0

## 2019-09-24 ENCOUNTER — Other Ambulatory Visit: Payer: Self-pay

## 2019-09-24 ENCOUNTER — Encounter: Payer: Self-pay | Admitting: Obstetrics and Gynecology

## 2019-09-24 ENCOUNTER — Ambulatory Visit: Payer: Medicaid Other | Admitting: Obstetrics and Gynecology

## 2019-09-24 ENCOUNTER — Encounter: Payer: Self-pay | Admitting: Internal Medicine

## 2019-09-24 VITALS — BP 111/73 | HR 109 | Ht 65.0 in | Wt 138.0 lb

## 2019-09-24 DIAGNOSIS — N939 Abnormal uterine and vaginal bleeding, unspecified: Secondary | ICD-10-CM

## 2019-09-24 NOTE — Progress Notes (Addendum)
GYN presents for Consult for Ablation. C/o heavy bleeding, cramps 8-9/10 x 1 year.  Removed IUD last month.  Last PAP 02/19/18  Pt not seen d/t to + Covid 19 screening questions.

## 2019-09-27 ENCOUNTER — Encounter: Payer: Self-pay | Admitting: Internal Medicine

## 2019-09-27 ENCOUNTER — Other Ambulatory Visit: Payer: Self-pay

## 2019-09-27 ENCOUNTER — Ambulatory Visit: Payer: Medicaid Other | Attending: Internal Medicine | Admitting: Internal Medicine

## 2019-09-27 VITALS — BP 180/100 | HR 93 | Temp 98.4°F | Resp 18 | Ht 65.0 in | Wt 146.0 lb

## 2019-09-27 DIAGNOSIS — E1042 Type 1 diabetes mellitus with diabetic polyneuropathy: Secondary | ICD-10-CM | POA: Insufficient documentation

## 2019-09-27 DIAGNOSIS — Z79899 Other long term (current) drug therapy: Secondary | ICD-10-CM | POA: Insufficient documentation

## 2019-09-27 DIAGNOSIS — Z79890 Hormone replacement therapy (postmenopausal): Secondary | ICD-10-CM | POA: Diagnosis not present

## 2019-09-27 DIAGNOSIS — Z794 Long term (current) use of insulin: Secondary | ICD-10-CM | POA: Insufficient documentation

## 2019-09-27 DIAGNOSIS — Z803 Family history of malignant neoplasm of breast: Secondary | ICD-10-CM | POA: Insufficient documentation

## 2019-09-27 DIAGNOSIS — Z23 Encounter for immunization: Secondary | ICD-10-CM | POA: Diagnosis not present

## 2019-09-27 DIAGNOSIS — F419 Anxiety disorder, unspecified: Secondary | ICD-10-CM | POA: Insufficient documentation

## 2019-09-27 DIAGNOSIS — K219 Gastro-esophageal reflux disease without esophagitis: Secondary | ICD-10-CM | POA: Diagnosis not present

## 2019-09-27 DIAGNOSIS — Z833 Family history of diabetes mellitus: Secondary | ICD-10-CM | POA: Insufficient documentation

## 2019-09-27 DIAGNOSIS — I129 Hypertensive chronic kidney disease with stage 1 through stage 4 chronic kidney disease, or unspecified chronic kidney disease: Secondary | ICD-10-CM | POA: Diagnosis not present

## 2019-09-27 DIAGNOSIS — Z01818 Encounter for other preprocedural examination: Secondary | ICD-10-CM | POA: Diagnosis not present

## 2019-09-27 DIAGNOSIS — F329 Major depressive disorder, single episode, unspecified: Secondary | ICD-10-CM | POA: Diagnosis not present

## 2019-09-27 DIAGNOSIS — E1043 Type 1 diabetes mellitus with diabetic autonomic (poly)neuropathy: Secondary | ICD-10-CM | POA: Diagnosis not present

## 2019-09-27 DIAGNOSIS — E1040 Type 1 diabetes mellitus with diabetic neuropathy, unspecified: Secondary | ICD-10-CM | POA: Insufficient documentation

## 2019-09-27 DIAGNOSIS — I10 Essential (primary) hypertension: Secondary | ICD-10-CM

## 2019-09-27 DIAGNOSIS — J45909 Unspecified asthma, uncomplicated: Secondary | ICD-10-CM | POA: Diagnosis not present

## 2019-09-27 DIAGNOSIS — F321 Major depressive disorder, single episode, moderate: Secondary | ICD-10-CM | POA: Diagnosis not present

## 2019-09-27 DIAGNOSIS — Z823 Family history of stroke: Secondary | ICD-10-CM | POA: Insufficient documentation

## 2019-09-27 DIAGNOSIS — K029 Dental caries, unspecified: Secondary | ICD-10-CM | POA: Diagnosis not present

## 2019-09-27 DIAGNOSIS — E1022 Type 1 diabetes mellitus with diabetic chronic kidney disease: Secondary | ICD-10-CM | POA: Insufficient documentation

## 2019-09-27 DIAGNOSIS — K3184 Gastroparesis: Secondary | ICD-10-CM | POA: Insufficient documentation

## 2019-09-27 DIAGNOSIS — Z9119 Patient's noncompliance with other medical treatment and regimen: Secondary | ICD-10-CM | POA: Insufficient documentation

## 2019-09-27 DIAGNOSIS — N184 Chronic kidney disease, stage 4 (severe): Secondary | ICD-10-CM | POA: Insufficient documentation

## 2019-09-27 DIAGNOSIS — E785 Hyperlipidemia, unspecified: Secondary | ICD-10-CM | POA: Diagnosis not present

## 2019-09-27 DIAGNOSIS — Z888 Allergy status to other drugs, medicaments and biological substances status: Secondary | ICD-10-CM | POA: Diagnosis not present

## 2019-09-27 DIAGNOSIS — Z882 Allergy status to sulfonamides status: Secondary | ICD-10-CM | POA: Diagnosis not present

## 2019-09-27 DIAGNOSIS — Z8042 Family history of malignant neoplasm of prostate: Secondary | ICD-10-CM | POA: Insufficient documentation

## 2019-09-27 DIAGNOSIS — Z87891 Personal history of nicotine dependence: Secondary | ICD-10-CM | POA: Insufficient documentation

## 2019-09-27 DIAGNOSIS — E1029 Type 1 diabetes mellitus with other diabetic kidney complication: Secondary | ICD-10-CM | POA: Diagnosis not present

## 2019-09-27 DIAGNOSIS — N2581 Secondary hyperparathyroidism of renal origin: Secondary | ICD-10-CM | POA: Insufficient documentation

## 2019-09-27 DIAGNOSIS — Z8249 Family history of ischemic heart disease and other diseases of the circulatory system: Secondary | ICD-10-CM | POA: Insufficient documentation

## 2019-09-27 NOTE — Patient Instructions (Signed)
Please give patient an appointment with the clinical pharmacist next week for blood pressure recheck.  Please have your dentist fax me the preoperative form that we need to fill out for you.  I will send a prescription to the pharmacy for some antibiotics for you.  Please remember to contact the eye doctor, Dr. Schuyler Amor, to get an appointment for your diabetic eye exam.

## 2019-09-27 NOTE — Progress Notes (Signed)
Patient ID: Angela [name], female    DOB: [birth year]  MRN: [id number]  CC: Medical Clearance   Subjective: Angela [name] is a 33 y.o. female who presents for pre-op clearance Her concerns today include:  Pt with hx of DM type 1 with neuropathy, gastroparesis and nephropathy, HTN, CKD stage 4, ACD/IDA, hypothyroid, HL  Pt plans to have 2-3 teeth extractions by [place].   Will be done in office with anesthesia Having a lot of pain in the upper jaw.  She states that she has been on antibiotics from the dentist for the past week but she has not been able to tolerate the antibiotics.  Every time she takes it she throws up.  She does not recall the name of the antibiotics other than to tell me that it begins with a C Last surgery at age 60.  No problems waking from anesthesia  HTN: Blood pressure elevated today.  She tells me that she has been compliant with all medications and have taken them already for today.  Hydralazine was increased last week to 100 mg twice a day by her nephrologist.  She denies any chest pains or shortness of breath.  No lower extremity edema.  DM: BS have been elevated in the 200-400 range.  She saw the endocrinologist last month and her insulin pump was adjusted.  She also has an appointment coming up next week with the diabetic educator there.  Her thyroid level was checked and was not at goal.  Levothyroxine was increased to 200 mcg daily.    Needs referral to therapits as part of work up for kidney transplant.  She is on Lexapro and BuSpar for depression and anxiety.  She feels she is doing okay on the medications.  Patient Active Problem List   Diagnosis Date Noted  . Abnormal uterine bleeding (AUB) 08/13/2019  . Ovarian mass, left 11/27/2018  . Moderate major depression ([place]) 11/27/2018  . Right flank mass 05/03/2018  . Secondary hyperparathyroidism ([place]) 05/01/2018  . Anxiety and depression 06/23/2017  . Insomnia  01/12/2016  . Non compliance with medical treatment 01/05/2016  . Hyperlipidemia 01/05/2016  . Chronic kidney disease (CKD) stage G3a/A3, moderately decreased glomerular filtration rate (GFR) between 45-59 mL/min/1.73 square meter and albuminuria creatinine ratio greater than 300 mg/g 07/28/2015  . Neurogenic bladder 02/20/2015  . Diabetic peripheral neuropathy associated with type 1 diabetes mellitus ([place]) 02/20/2015  . Iron deficiency 02/13/2015  . Asthma 09/30/2014  . Diabetic retinopathy ([place]) 09/30/2014  . Atypical squamous cells of undetermined significance (ASCUS) on Papanicolaou smear of cervix 08/11/2014  . Diabetic gastroparesis associated with type 1 diabetes mellitus ([place]) 08/05/2014  . Anemia in chronic renal disease 08/05/2014  . Hyperkalemia 08/04/2014  . HTN (hypertension) 07/11/2012  . GERD (gastroesophageal reflux disease) 09/26/2011  . Hypothyroidism 09/14/2006  . 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, with long-term current use of insulin ([place]) 01/15/2000     Current Outpatient Medications on File Prior to Visit  Medication Sig Dispense Refill  . albuterol (PROVENTIL HFA;VENTOLIN HFA) 108 (90 Base) MCG/ACT inhaler Inhale 2 puffs into the lungs every 6 (six) hours as needed for wheezing or shortness of breath. 1 Inhaler 6  . amLODipine (NORVASC) 10 MG tablet Take 1 tablet (10 mg total) by mouth daily. 90 tablet 3  . Blood Pressure Monitor DEVI Use as directed to check home blood pressure 2-3 times a week 1 Device 0  . busPIRone (BUSPAR) 10 MG tablet  Take 1 tablet (10 mg total) by mouth 2 (two) times daily. 60 tablet 3  . diltiazem (CARTIA XT) 240 MG 24 hr capsule Take 1 capsule (240 mg total) by mouth daily. 30 capsule 3  . escitalopram (LEXAPRO) 20 MG tablet Take 1 tablet (20 mg total) by mouth daily. 30 tablet 3  . fluticasone (FLONASE) 50 MCG/ACT nasal spray Place 2 sprays into both nostrils daily. 16 g 0  . furosemide (LASIX) 40 MG tablet Take by mouth.     . gabapentin (NEURONTIN) 300 MG capsule TAKE 1 CAPSULE BY MOUTH 2 TIMES DAILY. 60 capsule 3  . hydrALAZINE (APRESOLINE) 50 MG tablet Take 1.5 tablets (75 mg total) by mouth 3 (three) times daily. 135 tablet 6  . hydroxypropyl methylcellulose / hypromellose (ISOPTO TEARS / GONIOVISC) 2.5 % ophthalmic solution Place 1 drop into both eyes 3 (three) times daily as needed for dry eyes.    . insulin aspart (NOVOLOG) 100 UNIT/ML injection USE AS DIRECTED IN INSULIN PUMP 10 mL 2  . Insulin Pen Needle 32G X 4 MM MISC Used to give insulin injections four times daily. 200 each 11  . Insulin Syringe-Needle U-100 (BD INSULIN SYRINGE ULTRAFINE) 31G X 15/64" 1 ML MISC Used to give daily insulin injections. 100 each 11  . levothyroxine (SYNTHROID) 175 MCG tablet TAKE 1 TABLET BY MOUTH DAILY BEFORE BREAKFAST. 30 tablet 3  . lidocaine (LIDODERM) 5 % Place 1 patch onto the skin daily. Remove & Discard patch within 12 hours or as directed by MD 30 patch 0  . loratadine (CLARITIN) 10 MG tablet 1 tab every 24-48 hours PRN 30 tablet 2  . losartan (COZAAR) 50 MG tablet Take by mouth.    . methocarbamol (ROBAXIN) 500 MG tablet Take 1 tablet (500 mg total) by mouth 2 (two) times daily. 20 tablet 0  . metoCLOPramide (REGLAN) 5 MG tablet Take 1 tablet (5 mg total) by mouth 3 (three) times daily as needed for nausea or vomiting. 60 tablet 4  . pantoprazole (PROTONIX) 40 MG tablet Take 1 tablet (40 mg total) by mouth daily. 30 tablet 3  . pravastatin (PRAVACHOL) 20 MG tablet Take 1 tablet (20 mg total) by mouth daily. 30 tablet 6  . sodium bicarbonate 650 MG tablet Take 1 tablet (650 mg total) by mouth 2 (two) times daily. 60 tablet 4  . TRESIBA FLEXTOUCH 100 UNIT/ML SOPN FlexTouch Pen INJECT 40 UNITS ONCE DAILY WITH ADDITONAL AS NEEDED. MAX DAILY DOSE OF 50 UNITS  3  . benzonatate (TESSALON) 200 MG capsule Take 1 capsule (200 mg total) by mouth 3 (three) times daily as needed for cough. (Patient not taking: Reported on  08/13/2019) 90 capsule 1  . Cholecalciferol (VITAMIN D3) 1000 units CAPS Take 1,000 Units by mouth daily.     . ergocalciferol (DRISDOL) 1.25 MG (50000 UT) capsule Take 1 capsule (50,000 Units total) by mouth once a week. (Patient not taking: Reported on 05/14/2019) 9 capsule 1  . oxyCODONE-acetaminophen (PERCOCET/ROXICET) 5-325 MG tablet Take one tablet every 4-6 hours as needed for severe pain. (Patient not taking: Reported on 09/27/2019) 15 tablet 0  . patiromer (VELTASSA) 8.4 g packet Take 8.4 g by mouth daily.     . patiromer (VELTASSA) 8.4 g packet Take by mouth.     No current facility-administered medications on file prior to visit.     Allergies  Allergen Reactions  . Ferumoxytol Itching  . Lisinopril Other (See Comments)    hyperkalemia  . Sulfamethoxazole  Hives and Itching  . Trimethoprim Hives    Social History   Socioeconomic History  . Marital status: Divorced    Spouse name: Not on file  . Number of children: 0  . Years of education: 10th grade  . Highest education level: Not on file  Occupational History  . Occupation: unemployed    Comment: worked at a group  Social Needs  . Financial resource strain: Not on file  . Food insecurity    Worry: Not on file    Inability: Not on file  . Transportation needs    Medical: Not on file    Non-medical: Not on file  Tobacco Use  . Smoking status: Former Smoker    Packs/day: 0.25    Years: 2.00    Pack years: 0.50    Types: Cigarettes    Quit date: 03/04/2013    Years since quitting: 6.5  . Smokeless tobacco: Never Used  Substance and Sexual Activity  . Alcohol use: No    Alcohol/week: 0.0 standard drinks  . Drug use: Yes    Types: Marijuana    Comment: last used 4 months ago  . Sexual activity: Yes    Partners: Female    Birth control/protection: None    Comment: women preference   Lifestyle  . Physical activity    Days per week: Not on file    Minutes per session: Not on file  . Stress: Not on file   Relationships  . Social Herbalist on phone: Not on file    Gets together: Not on file    Attends religious service: Not on file    Active member of club or organization: Not on file    Attends meetings of clubs or organizations: Not on file    Relationship status: Not on file  . Intimate partner violence    Fear of current or ex partner: Not on file    Emotionally abused: Not on file    Physically abused: Not on file    Forced sexual activity: Not on file  Other Topics Concern  . Not on file  Social History Narrative   Occupation: currently unemployed   Single   Homosexual,     Lives with mom.    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            Family History  Problem Relation Age of Onset  . Multiple sclerosis Mother   . Hypothyroidism Mother   . Stroke Mother        at age 81 yo  . Hyperlipidemia Maternal Grandmother   . Hypertension Maternal Grandmother   . Heart disease Maternal Grandmother        unknown type  . Diabetes Maternal Grandmother   . Hypertension Maternal Grandfather   . Prostate cancer Maternal Grandfather   . Diabetes type I Maternal Grandfather   . Breast cancer Paternal Grandmother   . Cancer Neg Hx     Past Surgical History:  Procedure Laterality Date  . FOOT FUSION Right 2006   "put screws in it too" (09/19/2013)  ROS: Review of Systems Negative except as stated above  PHYSICAL EXAM: BP (!) 180/100   Pulse 93   Temp 98.4 F (36.9 C) (Oral)   Resp 18   Ht 5\' 5"  (1.651 m)   Wt 146 lb (66.2 kg)   LMP 09/04/2019 (Exact Date)   SpO2 99%   BMI 24.30 kg/m   Physical Exam  General appearance - alert, well  appearing, and in no distress Mental status - normal mood, behavior, speech, dress, motor activity, and thought processes Mouth - mucous membranes moist, pharynx normal without lesions.  No periodontal abscess noted at this time. Nose: Moist mucous membranes.  No conjunctival enlargement Neck - supple, no significant adenopathy Chest - clear to auscultation, no wheezes, rales or rhonchi, symmetric air entry Heart -regular rate rhythm.  2/6 systolic ejection murmur along left sternal border. Extremities -no lower extremity edema   CMP Latest Ref Rng & Units 08/16/2019 08/09/2019 05/14/2019  Glucose 70 - 99 mg/dL 82 268(H) 133(H)  BUN 6 - 20 mg/dL 36(H) 34(H) 36(H)  Creatinine 0.44 - 1.00 mg/dL 3.64(H) 3.76(H) 3.10(H)  Sodium 135 - 145 mmol/L 137 134 136  Potassium 3.5 - 5.1 mmol/L 4.4 5.0 5.7(H)  Chloride 98 - 111 mmol/L 105 99 101  CO2 22 - 32 mmol/L 25 22 20   Calcium 8.9 - 10.3 mg/dL 10.0 9.5 9.8  Total Protein 6.0 - 8.5 g/dL - 6.1 -  Total Bilirubin 0.0 - 1.2 mg/dL - 0.3 -  Alkaline Phos 39 - 117 IU/L - 83 -  AST 0 - 40 IU/L - 19 -  ALT 0 - 32 IU/L - 12 -   Lipid Panel     Component Value Date/Time   CHOL 254 (H) 06/23/2017 1151   TRIG 125 06/23/2017 1151   HDL 87 06/23/2017 1151   CHOLHDL 2.9 06/23/2017 1151   CHOLHDL 3.8 02/04/2016 1703   VLDL 71 (H) 02/04/2016 1703   LDLCALC 142 (H) 06/23/2017 1151    CBC    Component Value Date/Time   WBC 6.1 08/09/2019 1544   WBC 4.9 11/21/2018 1931   RBC 3.14 (L) 08/09/2019 1544   RBC 3.52 (L) 11/21/2018 1931   HGB 8.7 (L) 08/09/2019 1544   HCT 27.7 (L) 08/09/2019 1544   PLT 161 08/09/2019 1544   MCV 88 08/09/2019 1544   MCH 27.7 08/09/2019 1544   MCH 27.6 11/21/2018 1931   MCHC 31.4 (L) 08/09/2019 1544   MCHC 30.6 11/21/2018 1931   RDW 11.6 (L) 08/09/2019 1544   LYMPHSABS 2.3 05/21/2018 1707   MONOABS 0.3 03/01/2018 1421   EOSABS 0.0 05/21/2018 1707   BASOSABS 0.0 05/21/2018 1707    ASSESSMENT AND PLAN: 1.  Preoperative evaluation to rule out surgical contraindication Patient advised that we need to get her blood pressure better before clearing her for her dental procedure.  Despite telling me that she has a is compliant with all of her blood pressure medications, she indicated that she has not been taking the Cardizem in the past several weeks because she did not realize she was still supposed to be on it.  She will start taking it and will come back for blood pressure recheck next week with our clinical pharmacist.  If blood pressure is significantly improved we can go ahead and clear her for the procedure.  2. Essential hypertension See #1 above.  Encourage compliance with medications.  3. Dental cavities She will stop the current antibiotics that she is taking.  I will  send a prescription to her pharmacy for amoxicillin.  4. 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, with long-term current use of insulin ([place]) Followed and managed by endocrinology  5. Anxiety and depression - Ambulatory referral to Psychiatry  6. Need for influenza vaccination Given    Patient was given the opportunity to ask questions.  Patient verbalized understanding of the plan and was able to repeat key elements of the plan.   Orders Placed This Encounter  Procedures  . Ambulatory referral to Psychiatry     Requested Prescriptions    No prescriptions requested or ordered in this encounter    Return in about 3 months (around 12/28/2019).  Karle Plumber, MD, FACP

## 2019-09-27 NOTE — Progress Notes (Signed)
Patient CBG is 303 currently in the room and she covered herself with insulin through her pump.

## 2019-09-27 NOTE — Addendum Note (Signed)
Addended by: Trecia Rogers on: 09/27/2019 01:41 PM   Modules accepted: Orders

## 2019-09-28 ENCOUNTER — Encounter (HOSPITAL_COMMUNITY): Payer: Self-pay | Admitting: *Deleted

## 2019-09-28 ENCOUNTER — Other Ambulatory Visit: Payer: Self-pay

## 2019-09-28 ENCOUNTER — Emergency Department (HOSPITAL_COMMUNITY)
Admission: EM | Admit: 2019-09-28 | Discharge: 2019-09-29 | Disposition: A | Discharge status: Home / Self Care | Payer: Medicaid Other | Attending: Emergency Medicine | Admitting: Emergency Medicine

## 2019-09-28 ENCOUNTER — Other Ambulatory Visit: Payer: Self-pay | Admitting: Internal Medicine

## 2019-09-28 DIAGNOSIS — N1831 Chronic kidney disease, stage 3a: Secondary | ICD-10-CM | POA: Diagnosis not present

## 2019-09-28 DIAGNOSIS — E1022 Type 1 diabetes mellitus with diabetic chronic kidney disease: Secondary | ICD-10-CM | POA: Insufficient documentation

## 2019-09-28 DIAGNOSIS — D508 Other iron deficiency anemias: Secondary | ICD-10-CM | POA: Diagnosis not present

## 2019-09-28 DIAGNOSIS — Z794 Long term (current) use of insulin: Secondary | ICD-10-CM | POA: Insufficient documentation

## 2019-09-28 DIAGNOSIS — I129 Hypertensive chronic kidney disease with stage 1 through stage 4 chronic kidney disease, or unspecified chronic kidney disease: Secondary | ICD-10-CM | POA: Insufficient documentation

## 2019-09-28 DIAGNOSIS — J45909 Unspecified asthma, uncomplicated: Secondary | ICD-10-CM | POA: Diagnosis not present

## 2019-09-28 DIAGNOSIS — K029 Dental caries, unspecified: Secondary | ICD-10-CM | POA: Diagnosis not present

## 2019-09-28 DIAGNOSIS — Z79899 Other long term (current) drug therapy: Secondary | ICD-10-CM | POA: Insufficient documentation

## 2019-09-28 DIAGNOSIS — G44219 Episodic tension-type headache, not intractable: Secondary | ICD-10-CM | POA: Diagnosis not present

## 2019-09-28 DIAGNOSIS — E039 Hypothyroidism, unspecified: Secondary | ICD-10-CM | POA: Diagnosis not present

## 2019-09-28 DIAGNOSIS — Z87891 Personal history of nicotine dependence: Secondary | ICD-10-CM | POA: Diagnosis not present

## 2019-09-28 DIAGNOSIS — K0889 Other specified disorders of teeth and supporting structures: Secondary | ICD-10-CM | POA: Diagnosis present

## 2019-09-28 DIAGNOSIS — I10 Essential (primary) hypertension: Secondary | ICD-10-CM | POA: Diagnosis not present

## 2019-09-28 LAB — CBG MONITORING, ED: Glucose-Capillary: 198 mg/dL — ABNORMAL HIGH (ref 70–99)

## 2019-09-28 LAB — I-STAT BETA HCG BLOOD, ED (MC, WL, AP ONLY): I-stat hCG, quantitative: <5 m[IU]/mL (ref <5)

## 2019-09-28 MED ORDER — FENTANYL CITRATE (PF) 100 MCG/2ML IJ SOLN
50.0000 ug | Freq: Once | INTRAMUSCULAR | Status: AC
  Administered 2019-09-29: 50 ug via INTRAVENOUS
  Filled 2019-09-28: qty 2

## 2019-09-28 MED ORDER — SODIUM CHLORIDE 0.9% FLUSH
3.0000 mL | Freq: Once | INTRAVENOUS | Status: AC
  Administered 2019-09-28: 23:00:00 3 mL via INTRAVENOUS

## 2019-09-28 MED ORDER — SODIUM CHLORIDE 0.9 % IV BOLUS
500.0000 mL | Freq: Once | INTRAVENOUS | Status: AC
  Administered 2019-09-29: 01:00:00 500 mL via INTRAVENOUS

## 2019-09-28 MED ORDER — DILTIAZEM HCL ER COATED BEADS 240 MG PO CP24: 240.0000 mg | ORAL_CAPSULE | Freq: Every day | ORAL | 3 refills | Status: DC

## 2019-09-28 MED ORDER — ACETAMINOPHEN 500 MG PO TABS
1000.0000 mg | ORAL_TABLET | Freq: Once | ORAL | Status: AC
  Administered 2019-09-29: 01:00:00 1000 mg via ORAL
  Filled 2019-09-28: qty 2

## 2019-09-28 MED ORDER — AMOXICILLIN 500 MG PO CAPS: 500.0000 mg | ORAL_CAPSULE | Freq: Two times a day (BID) | ORAL | 0 refills | Status: DC

## 2019-09-28 MED ORDER — METOCLOPRAMIDE HCL 5 MG/ML IJ SOLN
10.0000 mg | Freq: Once | INTRAMUSCULAR | Status: AC
  Administered 2019-09-29: 01:00:00 10 mg via INTRAVENOUS
  Filled 2019-09-28: qty 2

## 2019-09-28 NOTE — ED Triage Notes (Signed)
Pt arrives with multiple complaints. She is reporting she has had a headache, saw her provider for the same two days ago. Headache continues. She is also having n/v/d, no fevers. Central "sharp" chest pain that radiates up through the right shoulder area. Unable to hold down her prescribed medications, insulin pump in place.

## 2019-09-28 NOTE — ED Notes (Signed)
Patient made aware that urine sample is needed. Patient ambulated to restroom with no assistance and steady gait.

## 2019-09-28 NOTE — ED Notes (Signed)
Patient changed into a gown and placed on cardiac monitor. Patient states that she has a history of hypertension and has been unable to tolerate her oral medications because of nausea and vomiting. No episodes of emesis noted, but patient does endorse nausea.

## 2019-09-29 LAB — URINALYSIS, ROUTINE W REFLEX MICROSCOPIC
Bacteria, UA: NONE SEEN
Bilirubin Urine: NEGATIVE
Glucose, UA: 150 mg/dL — AB
Ketones, ur: NEGATIVE mg/dL
Leukocytes,Ua: NEGATIVE
Nitrite: NEGATIVE
Protein, ur: >=300 mg/dL — AB
Specific Gravity, Urine: 1.007 (ref 1.005–1.030)
pH: 7 (ref 5.0–8.0)

## 2019-09-29 LAB — COMPREHENSIVE METABOLIC PANEL
ALT: 13 U/L (ref 0–44)
AST: 16 U/L (ref 15–41)
Albumin: 4 g/dL (ref 3.5–5.0)
Alkaline Phosphatase: 85 U/L (ref 38–126)
Anion gap: 12 (ref 5–15)
BUN: 36 mg/dL — ABNORMAL HIGH (ref 6–20)
CO2: 23 mmol/L (ref 22–32)
Calcium: 10.2 mg/dL (ref 8.9–10.3)
Chloride: 101 mmol/L (ref 98–111)
Creatinine, Ser: 3.5 mg/dL — ABNORMAL HIGH (ref 0.44–1.00)
GFR calc Af Amer: 19 mL/min — ABNORMAL LOW (ref >60)
GFR calc non Af Amer: 16 mL/min — ABNORMAL LOW (ref >60)
Glucose, Bld: 217 mg/dL — ABNORMAL HIGH (ref 70–99)
Potassium: 4.3 mmol/L (ref 3.5–5.1)
Sodium: 136 mmol/L (ref 135–145)
Total Bilirubin: 0.5 mg/dL (ref 0.3–1.2)
Total Protein: 7.1 g/dL (ref 6.5–8.1)

## 2019-09-29 LAB — CBC
HCT: 25 % — ABNORMAL LOW (ref 36.0–46.0)
Hemoglobin: 7.7 g/dL — ABNORMAL LOW (ref 12.0–15.0)
MCH: 28.6 pg (ref 26.0–34.0)
MCHC: 30.8 g/dL (ref 30.0–36.0)
MCV: 92.9 fL (ref 80.0–100.0)
Platelets: 151 10*3/uL (ref 150–400)
RBC: 2.69 MIL/uL — ABNORMAL LOW (ref 3.87–5.11)
RDW: 11.9 % (ref 11.5–15.5)
WBC: 6.2 10*3/uL (ref 4.0–10.5)
nRBC: 0 % (ref 0.0–0.2)

## 2019-09-29 LAB — LIPASE, BLOOD: Lipase: 19 U/L (ref 11–51)

## 2019-09-29 MED ORDER — AMOXICILLIN 500 MG PO CAPS
1000.0000 mg | ORAL_CAPSULE | Freq: Once | ORAL | Status: AC
  Administered 2019-09-29: 1000 mg via ORAL
  Filled 2019-09-29: qty 2
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
## 2019-09-30 ENCOUNTER — Other Ambulatory Visit: Payer: Self-pay

## 2019-09-30 DIAGNOSIS — Z20822 Contact with and (suspected) exposure to covid-19: Secondary | ICD-10-CM

## 2019-09-30 DIAGNOSIS — Z20828 Contact with and (suspected) exposure to other viral communicable diseases: Secondary | ICD-10-CM | POA: Diagnosis not present

## 2019-10-01 LAB — NOVEL CORONAVIRUS, NAA: SARS-CoV-2, NAA: NOT DETECTED

## 2019-10-08 LAB — HM DIABETES EYE EXAM

## 2019-10-17 ENCOUNTER — Ambulatory Visit: Payer: Medicaid Other | Attending: Internal Medicine | Admitting: Pharmacist

## 2019-10-17 ENCOUNTER — Other Ambulatory Visit: Payer: Self-pay

## 2019-10-17 VITALS — BP 154/85 | HR 99

## 2019-10-17 DIAGNOSIS — Z79899 Other long term (current) drug therapy: Secondary | ICD-10-CM | POA: Diagnosis not present

## 2019-10-17 DIAGNOSIS — Z87891 Personal history of nicotine dependence: Secondary | ICD-10-CM | POA: Diagnosis not present

## 2019-10-17 DIAGNOSIS — I10 Essential (primary) hypertension: Secondary | ICD-10-CM | POA: Diagnosis not present

## 2019-10-17 DIAGNOSIS — E109 Type 1 diabetes mellitus without complications: Secondary | ICD-10-CM | POA: Diagnosis not present

## 2019-10-17 NOTE — Progress Notes (Signed)
   S:   PCP: Dr. Wynetta Emery  Patient arrives in good spirits.    Presents to the clinic for hypertension evaluation, counseling, and management.  Patient was referred and last seen by Primary Care Provider on 09/27/19 - BP was 180/100 at that visit.   Patient reports adherence with medications.  Reports increase in BLE edema. Denies chest pain, dyspnea HA or blurred vision  Current BP Medications include: amlodipine 10 mg, diltiazem 240 mg XT, furosemide 40 mg daily, Hydralazine 75 mg TID, Losartan 50 mg daily  Dietary habits include: nonadherent with salt restriction; does not limit caffiene  Exercise habits include: does not exercise Family / Social history:  - stroke, hypothyroidism, MS (mother) - former smoker (quit in 2014) - alcohol (denies)  O:  L arm after 5 minutes: 154/85  Home BP readings:  - 136/90s at her recent dentist appointment   Last 3 Office BP readings: BP Readings from Last 3 Encounters:  10/17/19 (!) 154/85  09/29/19 (!) 141/86  09/27/19 (!) 180/100   BMET    Component Value Date/Time   NA 136 09/28/2019 2303   NA 134 08/09/2019 1544   K 4.3 09/28/2019 2303   CL 101 09/28/2019 2303   CO2 23 09/28/2019 2303   GLUCOSE 217 (H) 09/28/2019 2303   BUN 36 (H) 09/28/2019 2303   BUN 34 (H) 08/09/2019 1544   CREATININE 3.50 (H) 09/28/2019 2303   CREATININE 2.31 (H) 02/04/2016 1703   CALCIUM 10.2 09/28/2019 2303   GFRNONAA 16 (L) 09/28/2019 2303   GFRNONAA 28 (L) 02/04/2016 1703   GFRAA 19 (L) 09/28/2019 2303   GFRAA 32 (L) 02/04/2016 1703   Renal function: CrCl cannot be calculated (Unknown ideal weight.).  Clinical ASCVD: No  The ASCVD Risk score Mikey Bussing DC Jr., et al., 2013) failed to calculate for the following reasons:   The 2013 ASCVD risk score is only valid for ages 86 to 42  A/P: Hypertension longstanding currently uncontrolled but improved on current medications. BP Goal = <130/80 mmHg. Patient reports adherence to medications. Will send  information to Dr. Wynetta Emery to consider for surgical clearance.  -Continued current regimen.   -Counseled on lifestyle modifications for blood pressure control including reduced dietary sodium, increased exercise, adequate sleep  Results reviewed and written information provided. Total time in face-to-face counseling 15 minutes.   F/U Clinic Visit w/ PCP in Jan.    Benard Halsted, PharmD, [place] [phone]

## 2019-10-17 NOTE — Patient Instructions (Signed)
Thank you for coming to see us today.   Blood pressure today is improving.  Continue taking blood pressure medications as prescribed.   Limiting salt and caffeine, as well as exercising as able for at least 30 minutes for 5 days out of the week, can also help you lower your blood pressure.  Take your blood pressure at home if you are able. Please write down these numbers and bring them to your visits.  If you have any questions about medications, please call me [phone].  Angela [name]  

## 2019-10-22 DIAGNOSIS — H35463 Secondary vitreoretinal degeneration, bilateral: Secondary | ICD-10-CM | POA: Diagnosis not present

## 2019-10-22 DIAGNOSIS — H35033 Hypertensive retinopathy, bilateral: Secondary | ICD-10-CM | POA: Diagnosis not present

## 2019-10-22 DIAGNOSIS — E103413 Type 1 diabetes mellitus with severe nonproliferative diabetic retinopathy with macular edema, bilateral: Secondary | ICD-10-CM | POA: Diagnosis not present

## 2019-10-22 DIAGNOSIS — H35373 Puckering of macula, bilateral: Secondary | ICD-10-CM | POA: Diagnosis not present

## 2019-10-24 MED FILL — DEXCOM G6 SENSOR MISC: 30 days supply | Qty: 3 | Fill #0

## 2019-10-24 MED FILL — DEXCOM G6 TRANSMITTER MISC: 1 days supply | Qty: 1 | Fill #0

## 2019-10-25 ENCOUNTER — Telehealth: Payer: Self-pay | Admitting: Internal Medicine

## 2019-10-25 MED ORDER — FLUCONAZOLE 150 MG PO TABS: 150.0000 mg | ORAL_TABLET | Freq: Once | ORAL | 0 refills | Status: AC

## 2019-10-25 NOTE — Telephone Encounter (Signed)
Patient came in asking for Yeast infection medication

## 2019-10-28 NOTE — Telephone Encounter (Signed)
Pt has already had teeth extracted

## 2019-10-29 ENCOUNTER — Ambulatory Visit (INDEPENDENT_AMBULATORY_CARE_PROVIDER_SITE_OTHER): Payer: Medicaid Other | Admitting: Psychiatry

## 2019-10-29 ENCOUNTER — Encounter: Payer: Self-pay | Admitting: Psychiatry

## 2019-10-29 ENCOUNTER — Other Ambulatory Visit: Payer: Self-pay

## 2019-10-29 DIAGNOSIS — F3342 Major depressive disorder, recurrent, in full remission: Secondary | ICD-10-CM | POA: Diagnosis not present

## 2019-10-29 DIAGNOSIS — F418 Other specified anxiety disorders: Secondary | ICD-10-CM | POA: Diagnosis not present

## 2019-10-29 DIAGNOSIS — F1210 Cannabis abuse, uncomplicated: Secondary | ICD-10-CM

## 2019-10-29 DIAGNOSIS — R4589 Other symptoms and signs involving emotional state: Secondary | ICD-10-CM

## 2019-10-29 MED ORDER — ESCITALOPRAM OXALATE 20 MG PO TABS: 20.0000 mg | ORAL_TABLET | Freq: Every day | ORAL | 2 refills | Status: DC

## 2019-10-29 MED ORDER — BUSPIRONE HCL 10 MG PO TABS: 10.0000 mg | ORAL_TABLET | Freq: Two times a day (BID) | ORAL | 2 refills | Status: DC
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
## 2019-11-11 ENCOUNTER — Ambulatory Visit: Payer: Medicaid Other | Admitting: Internal Medicine

## 2019-11-16 DIAGNOSIS — E1022 Type 1 diabetes mellitus with diabetic chronic kidney disease: Secondary | ICD-10-CM | POA: Diagnosis not present

## 2019-11-19 LAB — HM DIABETES EYE EXAM

## 2019-11-25 DIAGNOSIS — Z20828 Contact with and (suspected) exposure to other viral communicable diseases: Secondary | ICD-10-CM | POA: Diagnosis not present

## 2019-11-26 DIAGNOSIS — H35033 Hypertensive retinopathy, bilateral: Secondary | ICD-10-CM | POA: Diagnosis not present

## 2019-11-26 DIAGNOSIS — H43822 Vitreomacular adhesion, left eye: Secondary | ICD-10-CM | POA: Diagnosis not present

## 2019-11-26 DIAGNOSIS — E103593 Type 1 diabetes mellitus with proliferative diabetic retinopathy without macular edema, bilateral: Secondary | ICD-10-CM | POA: Diagnosis not present

## 2019-11-29 DIAGNOSIS — E1022 Type 1 diabetes mellitus with diabetic chronic kidney disease: Secondary | ICD-10-CM | POA: Diagnosis not present

## 2019-11-29 DIAGNOSIS — Z01818 Encounter for other preprocedural examination: Secondary | ICD-10-CM | POA: Diagnosis not present

## 2019-11-29 DIAGNOSIS — N184 Chronic kidney disease, stage 4 (severe): Secondary | ICD-10-CM | POA: Diagnosis not present

## 2019-12-02 DIAGNOSIS — E103593 Type 1 diabetes mellitus with proliferative diabetic retinopathy without macular edema, bilateral: Secondary | ICD-10-CM | POA: Diagnosis not present

## 2019-12-03 ENCOUNTER — Inpatient Hospital Stay (HOSPITAL_COMMUNITY)
Admission: EM | Admit: 2019-12-03 | Discharge: 2019-12-05 | DRG: 637 | Disposition: A | Discharge status: Home / Self Care | Payer: Medicaid Other | Attending: Internal Medicine | Admitting: Internal Medicine

## 2019-12-03 ENCOUNTER — Encounter: Payer: Self-pay | Admitting: Internal Medicine

## 2019-12-03 ENCOUNTER — Inpatient Hospital Stay (HOSPITAL_COMMUNITY): Payer: Medicaid Other

## 2019-12-03 ENCOUNTER — Encounter (HOSPITAL_COMMUNITY): Payer: Self-pay

## 2019-12-03 DIAGNOSIS — E875 Hyperkalemia: Secondary | ICD-10-CM | POA: Diagnosis present

## 2019-12-03 DIAGNOSIS — E1100 Type 2 diabetes mellitus with hyperosmolarity without nonketotic hyperglycemic-hyperosmolar coma (NKHHC): Secondary | ICD-10-CM | POA: Diagnosis present

## 2019-12-03 DIAGNOSIS — I169 Hypertensive crisis, unspecified: Secondary | ICD-10-CM | POA: Diagnosis not present

## 2019-12-03 DIAGNOSIS — E871 Hypo-osmolality and hyponatremia: Secondary | ICD-10-CM | POA: Diagnosis present

## 2019-12-03 DIAGNOSIS — G9341 Metabolic encephalopathy: Secondary | ICD-10-CM | POA: Diagnosis present

## 2019-12-03 DIAGNOSIS — R739 Hyperglycemia, unspecified: Secondary | ICD-10-CM

## 2019-12-03 DIAGNOSIS — E1022 Type 1 diabetes mellitus with diabetic chronic kidney disease: Secondary | ICD-10-CM | POA: Diagnosis present

## 2019-12-03 DIAGNOSIS — Z9111 Patient's noncompliance with dietary regimen: Secondary | ICD-10-CM | POA: Diagnosis not present

## 2019-12-03 DIAGNOSIS — E1065 Type 1 diabetes mellitus with hyperglycemia: Secondary | ICD-10-CM | POA: Diagnosis not present

## 2019-12-03 DIAGNOSIS — E039 Hypothyroidism, unspecified: Secondary | ICD-10-CM | POA: Diagnosis present

## 2019-12-03 DIAGNOSIS — J45909 Unspecified asthma, uncomplicated: Secondary | ICD-10-CM | POA: Diagnosis present

## 2019-12-03 DIAGNOSIS — Z794 Long term (current) use of insulin: Secondary | ICD-10-CM | POA: Diagnosis not present

## 2019-12-03 DIAGNOSIS — K219 Gastro-esophageal reflux disease without esophagitis: Secondary | ICD-10-CM | POA: Diagnosis present

## 2019-12-03 DIAGNOSIS — I129 Hypertensive chronic kidney disease with stage 1 through stage 4 chronic kidney disease, or unspecified chronic kidney disease: Secondary | ICD-10-CM | POA: Diagnosis present

## 2019-12-03 DIAGNOSIS — N179 Acute kidney failure, unspecified: Secondary | ICD-10-CM | POA: Diagnosis present

## 2019-12-03 DIAGNOSIS — E1010 Type 1 diabetes mellitus with ketoacidosis without coma: Secondary | ICD-10-CM | POA: Diagnosis not present

## 2019-12-03 DIAGNOSIS — Z20828 Contact with and (suspected) exposure to other viral communicable diseases: Secondary | ICD-10-CM | POA: Diagnosis present

## 2019-12-03 DIAGNOSIS — R0689 Other abnormalities of breathing: Secondary | ICD-10-CM | POA: Diagnosis not present

## 2019-12-03 DIAGNOSIS — E785 Hyperlipidemia, unspecified: Secondary | ICD-10-CM | POA: Diagnosis present

## 2019-12-03 DIAGNOSIS — E1165 Type 2 diabetes mellitus with hyperglycemia: Secondary | ICD-10-CM | POA: Diagnosis not present

## 2019-12-03 DIAGNOSIS — N184 Chronic kidney disease, stage 4 (severe): Secondary | ICD-10-CM | POA: Diagnosis not present

## 2019-12-03 DIAGNOSIS — Z9114 Patient's other noncompliance with medication regimen: Secondary | ICD-10-CM

## 2019-12-03 DIAGNOSIS — E10319 Type 1 diabetes mellitus with unspecified diabetic retinopathy without macular edema: Secondary | ICD-10-CM | POA: Diagnosis present

## 2019-12-03 DIAGNOSIS — Z79899 Other long term (current) drug therapy: Secondary | ICD-10-CM

## 2019-12-03 DIAGNOSIS — Z833 Family history of diabetes mellitus: Secondary | ICD-10-CM | POA: Diagnosis not present

## 2019-12-03 DIAGNOSIS — Z9641 Presence of insulin pump (external) (internal): Secondary | ICD-10-CM | POA: Diagnosis present

## 2019-12-03 DIAGNOSIS — Z79890 Hormone replacement therapy (postmenopausal): Secondary | ICD-10-CM

## 2019-12-03 DIAGNOSIS — I10 Essential (primary) hypertension: Secondary | ICD-10-CM | POA: Diagnosis not present

## 2019-12-03 DIAGNOSIS — Z87891 Personal history of nicotine dependence: Secondary | ICD-10-CM | POA: Diagnosis not present

## 2019-12-03 DIAGNOSIS — E1040 Type 1 diabetes mellitus with diabetic neuropathy, unspecified: Secondary | ICD-10-CM | POA: Diagnosis present

## 2019-12-03 DIAGNOSIS — S2241XA Multiple fractures of ribs, right side, initial encounter for closed fracture: Secondary | ICD-10-CM | POA: Diagnosis not present

## 2019-12-03 LAB — COMPREHENSIVE METABOLIC PANEL
ALT: 20 U/L (ref 0–44)
AST: 21 U/L (ref 15–41)
Albumin: 4.2 g/dL (ref 3.5–5.0)
Alkaline Phosphatase: 127 U/L — ABNORMAL HIGH (ref 38–126)
Anion gap: 12 (ref 5–15)
BUN: 39 mg/dL — ABNORMAL HIGH (ref 6–20)
CO2: 23 mmol/L (ref 22–32)
Calcium: 9.8 mg/dL (ref 8.9–10.3)
Chloride: 91 mmol/L — ABNORMAL LOW (ref 98–111)
Creatinine, Ser: 4.36 mg/dL — ABNORMAL HIGH (ref 0.44–1.00)
GFR calc Af Amer: 14 mL/min — ABNORMAL LOW (ref >60)
GFR calc non Af Amer: 12 mL/min — ABNORMAL LOW (ref >60)
Glucose, Bld: 904 mg/dL (ref 70–99)
Potassium: 5.4 mmol/L — ABNORMAL HIGH (ref 3.5–5.1)
Sodium: 126 mmol/L — ABNORMAL LOW (ref 135–145)
Total Bilirubin: 0.8 mg/dL (ref 0.3–1.2)
Total Protein: 7.2 g/dL (ref 6.5–8.1)

## 2019-12-03 LAB — BASIC METABOLIC PANEL
Anion gap: 9 (ref 5–15)
Anion gap: 8 (ref 5–15)
BUN: 32 mg/dL — ABNORMAL HIGH (ref 6–20)
BUN: 34 mg/dL — ABNORMAL HIGH (ref 6–20)
CO2: 19 mmol/L — ABNORMAL LOW (ref 22–32)
CO2: 23 mmol/L (ref 22–32)
Calcium: 8.2 mg/dL — ABNORMAL LOW (ref 8.9–10.3)
Calcium: 9.2 mg/dL (ref 8.9–10.3)
Chloride: 105 mmol/L (ref 98–111)
Chloride: 107 mmol/L (ref 98–111)
Creatinine, Ser: 3.32 mg/dL — ABNORMAL HIGH (ref 0.44–1.00)
Creatinine, Ser: 3.44 mg/dL — ABNORMAL HIGH (ref 0.44–1.00)
GFR calc Af Amer: 20 mL/min — ABNORMAL LOW (ref >60)
GFR calc Af Amer: 19 mL/min — ABNORMAL LOW (ref >60)
GFR calc non Af Amer: 17 mL/min — ABNORMAL LOW (ref >60)
GFR calc non Af Amer: 16 mL/min — ABNORMAL LOW (ref >60)
Glucose, Bld: 620 mg/dL (ref 70–99)
Glucose, Bld: 197 mg/dL — ABNORMAL HIGH (ref 70–99)
Potassium: 4.1 mmol/L (ref 3.5–5.1)
Potassium: 4 mmol/L (ref 3.5–5.1)
Sodium: 133 mmol/L — ABNORMAL LOW (ref 135–145)
Sodium: 138 mmol/L (ref 135–145)

## 2019-12-03 LAB — CBC WITH DIFFERENTIAL/PLATELET
Abs Immature Granulocytes: 0.01 10*3/uL (ref 0.00–0.07)
Basophils Absolute: 0 10*3/uL (ref 0.0–0.1)
Basophils Relative: 0 %
Eosinophils Absolute: 0 10*3/uL (ref 0.0–0.5)
Eosinophils Relative: 0 %
HCT: 34.6 % — ABNORMAL LOW (ref 36.0–46.0)
Hemoglobin: 10.7 g/dL — ABNORMAL LOW (ref 12.0–15.0)
Immature Granulocytes: 0 %
Lymphocytes Relative: 17 %
Lymphs Abs: 0.7 10*3/uL (ref 0.7–4.0)
MCH: 27.5 pg (ref 26.0–34.0)
MCHC: 30.9 g/dL (ref 30.0–36.0)
MCV: 88.9 fL (ref 80.0–100.0)
Monocytes Absolute: 0.2 10*3/uL (ref 0.1–1.0)
Monocytes Relative: 5 %
Neutro Abs: 3 10*3/uL (ref 1.7–7.7)
Neutrophils Relative %: 78 %
Platelets: 140 10*3/uL — ABNORMAL LOW (ref 150–400)
RBC: 3.89 MIL/uL (ref 3.87–5.11)
RDW: 11.4 % — ABNORMAL LOW (ref 11.5–15.5)
WBC: 3.9 10*3/uL — ABNORMAL LOW (ref 4.0–10.5)
nRBC: 0 % (ref 0.0–0.2)

## 2019-12-03 LAB — URINALYSIS, ROUTINE W REFLEX MICROSCOPIC
Bacteria, UA: NONE SEEN
Bilirubin Urine: NEGATIVE
Glucose, UA: >=500 mg/dL — AB
Ketones, ur: NEGATIVE mg/dL
Leukocytes,Ua: NEGATIVE
Nitrite: NEGATIVE
Protein, ur: 100 mg/dL — AB
RBC / HPF: >50 RBC/hpf — ABNORMAL HIGH (ref 0–5)
Specific Gravity, Urine: 1.015 (ref 1.005–1.030)
pH: 7 (ref 5.0–8.0)

## 2019-12-03 LAB — SARS CORONAVIRUS 2 (TAT 6-24 HRS): SARS Coronavirus 2: NEGATIVE

## 2019-12-03 LAB — BLOOD GAS, VENOUS
Acid-base deficit: 3.4 mmol/L — ABNORMAL HIGH (ref 0.0–2.0)
Bicarbonate: 22.8 mmol/L (ref 20.0–28.0)
O2 Saturation: 61.6 %
Patient temperature: 98.6
pCO2, Ven: 49.4 mmHg (ref 44.0–60.0)
pH, Ven: 7.286 (ref 7.250–7.430)
pO2, Ven: 35.7 mmHg (ref 32.0–45.0)

## 2019-12-03 LAB — CBG MONITORING, ED
Glucose-Capillary: >600 mg/dL (ref 70–99)
Glucose-Capillary: 559 mg/dL (ref 70–99)
Glucose-Capillary: >600 mg/dL (ref 70–99)
Glucose-Capillary: 244 mg/dL — ABNORMAL HIGH (ref 70–99)
Glucose-Capillary: 414 mg/dL — ABNORMAL HIGH (ref 70–99)

## 2019-12-03 LAB — MAGNESIUM: Magnesium: 2.1 mg/dL (ref 1.7–2.4)

## 2019-12-03 LAB — BETA-HYDROXYBUTYRIC ACID: Beta-Hydroxybutyric Acid: 0.99 mmol/L — ABNORMAL HIGH (ref 0.05–0.27)

## 2019-12-03 LAB — GLUCOSE, CAPILLARY
Glucose-Capillary: 133 mg/dL — ABNORMAL HIGH (ref 70–99)
Glucose-Capillary: 136 mg/dL — ABNORMAL HIGH (ref 70–99)
Glucose-Capillary: 156 mg/dL — ABNORMAL HIGH (ref 70–99)
Glucose-Capillary: 93 mg/dL (ref 70–99)

## 2019-12-03 LAB — LIPASE, BLOOD: Lipase: 37 U/L (ref 11–51)

## 2019-12-03 LAB — MRSA PCR SCREENING: MRSA by PCR: NEGATIVE

## 2019-12-03 LAB — HCG, QUANTITATIVE, PREGNANCY: hCG, Beta Chain, Quant, S: <1 m[IU]/mL (ref <5)

## 2019-12-03 MED ORDER — SODIUM CHLORIDE 0.9 % IV BOLUS
1000.0000 mL | Freq: Once | INTRAVENOUS | Status: AC
  Administered 2019-12-03: 1000 mL via INTRAVENOUS

## 2019-12-03 MED ORDER — SODIUM CHLORIDE 0.9 % IV SOLN: Freq: Once | INTRAVENOUS | Status: DC

## 2019-12-03 MED ORDER — CHLORHEXIDINE GLUCONATE CLOTH 2 % EX PADS
6.0000 | MEDICATED_PAD | Freq: Every day | CUTANEOUS | Status: DC
  Administered 2019-12-03 – 2019-12-04 (×2): 6 via TOPICAL

## 2019-12-03 MED ORDER — INSULIN GLARGINE 100 UNIT/ML ~~LOC~~ SOLN
10.0000 [IU] | Freq: Every day | SUBCUTANEOUS | Status: DC
  Administered 2019-12-03: 10 [IU] via SUBCUTANEOUS
  Filled 2019-12-03 (×3): qty 0.1

## 2019-12-03 MED ORDER — SODIUM CHLORIDE 0.9 % IV BOLUS
1000.0000 mL | INTRAVENOUS | Status: AC
  Administered 2019-12-03 (×2): 1000 mL via INTRAVENOUS

## 2019-12-03 MED ORDER — INSULIN ASPART 100 UNIT/ML ~~LOC~~ SOLN: 0.0000 [IU] | Freq: Three times a day (TID) | SUBCUTANEOUS | Status: DC

## 2019-12-03 MED ORDER — DEXTROSE-NACL 5-0.45 % IV SOLN: INTRAVENOUS | Status: DC

## 2019-12-03 MED ORDER — GABAPENTIN 300 MG PO CAPS
300.0000 mg | ORAL_CAPSULE | Freq: Two times a day (BID) | ORAL | Status: DC
  Administered 2019-12-04 – 2019-12-05 (×3): 300 mg via ORAL
  Filled 2019-12-03 (×4): qty 1

## 2019-12-03 MED ORDER — ACETAMINOPHEN 650 MG RE SUPP: 650.0000 mg | Freq: Four times a day (QID) | RECTAL | Status: DC | PRN

## 2019-12-03 MED ORDER — ALBUTEROL SULFATE (2.5 MG/3ML) 0.083% IN NEBU: 2.5000 mg | INHALATION_SOLUTION | RESPIRATORY_TRACT | Status: DC | PRN

## 2019-12-03 MED ORDER — METOCLOPRAMIDE HCL 5 MG/ML IJ SOLN
5.0000 mg | Freq: Three times a day (TID) | INTRAMUSCULAR | Status: DC | PRN
  Administered 2019-12-03: 5 mg via INTRAVENOUS
  Filled 2019-12-03: qty 2

## 2019-12-03 MED ORDER — ALBUTEROL SULFATE HFA 108 (90 BASE) MCG/ACT IN AERS: 2.0000 | INHALATION_SPRAY | Freq: Four times a day (QID) | RESPIRATORY_TRACT | Status: DC | PRN

## 2019-12-03 MED ORDER — INSULIN ASPART 100 UNIT/ML ~~LOC~~ SOLN: 0.0000 [IU] | Freq: Every day | SUBCUTANEOUS | Status: DC

## 2019-12-03 MED ORDER — ESCITALOPRAM OXALATE 20 MG PO TABS
20.0000 mg | ORAL_TABLET | Freq: Every day | ORAL | Status: DC
  Administered 2019-12-03 – 2019-12-05 (×3): 20 mg via ORAL
  Filled 2019-12-03 (×3): qty 1

## 2019-12-03 MED ORDER — INSULIN REGULAR(HUMAN) IN NACL 100-0.9 UT/100ML-% IV SOLN
INTRAVENOUS | Status: DC
  Administered 2019-12-03: 5.5 [IU]/h via INTRAVENOUS
  Filled 2019-12-03: qty 100

## 2019-12-03 MED ORDER — INSULIN ASPART 100 UNIT/ML ~~LOC~~ SOLN: 3.0000 [IU] | Freq: Three times a day (TID) | SUBCUTANEOUS | Status: DC

## 2019-12-03 MED ORDER — POLYETHYLENE GLYCOL 3350 17 G PO PACK: 17.0000 g | PACK | Freq: Every day | ORAL | Status: DC | PRN

## 2019-12-03 MED ORDER — AMLODIPINE BESYLATE 5 MG PO TABS
10.0000 mg | ORAL_TABLET | Freq: Every day | ORAL | Status: DC
  Administered 2019-12-03 – 2019-12-05 (×3): 10 mg via ORAL
  Filled 2019-12-03 (×3): qty 2

## 2019-12-03 MED ORDER — INSULIN ASPART 100 UNIT/ML ~~LOC~~ SOLN
0.0000 [IU] | SUBCUTANEOUS | Status: DC
  Administered 2019-12-03: 1 [IU] via SUBCUTANEOUS

## 2019-12-03 MED ORDER — MORPHINE SULFATE (PF) 4 MG/ML IV SOLN
4.0000 mg | Freq: Once | INTRAVENOUS | Status: AC
  Administered 2019-12-03: 4 mg via INTRAVENOUS
  Filled 2019-12-03: qty 1

## 2019-12-03 MED ORDER — METOCLOPRAMIDE HCL 5 MG PO TABS: 5.0000 mg | ORAL_TABLET | Freq: Three times a day (TID) | ORAL | Status: DC | PRN

## 2019-12-03 MED ORDER — ONDANSETRON HCL 4 MG/2ML IJ SOLN
4.0000 mg | Freq: Four times a day (QID) | INTRAMUSCULAR | Status: DC | PRN
  Administered 2019-12-03: 4 mg via INTRAVENOUS
  Filled 2019-12-03: qty 2

## 2019-12-03 MED ORDER — HEPARIN SODIUM (PORCINE) 5000 UNIT/ML IJ SOLN
5000.0000 [IU] | Freq: Three times a day (TID) | INTRAMUSCULAR | Status: DC
  Administered 2019-12-03 – 2019-12-04 (×2): 5000 [IU] via SUBCUTANEOUS
  Filled 2019-12-03 (×3): qty 1

## 2019-12-03 MED ORDER — ONDANSETRON HCL 4 MG/2ML IJ SOLN
4.0000 mg | Freq: Once | INTRAMUSCULAR | Status: AC
  Administered 2019-12-03: 4 mg via INTRAVENOUS
  Filled 2019-12-03: qty 2

## 2019-12-03 MED ORDER — ACETAMINOPHEN 325 MG PO TABS: 650.0000 mg | ORAL_TABLET | Freq: Four times a day (QID) | ORAL | Status: DC | PRN

## 2019-12-03 MED ORDER — HYDRALAZINE HCL 10 MG PO TABS
50.0000 mg | ORAL_TABLET | Freq: Once | ORAL | Status: AC
  Administered 2019-12-03: 50 mg via ORAL
  Filled 2019-12-03: qty 5

## 2019-12-03 MED ORDER — PRAVASTATIN SODIUM 20 MG PO TABS
20.0000 mg | ORAL_TABLET | Freq: Every day | ORAL | Status: DC
  Administered 2019-12-03 – 2019-12-04 (×2): 20 mg via ORAL
  Filled 2019-12-03 (×2): qty 1

## 2019-12-03 MED ORDER — DILTIAZEM HCL ER COATED BEADS 240 MG PO CP24
240.0000 mg | ORAL_CAPSULE | Freq: Every day | ORAL | Status: DC
  Administered 2019-12-03 – 2019-12-05 (×3): 240 mg via ORAL
  Filled 2019-12-03: qty 2
  Filled 2019-12-03 (×2): qty 1
  Filled 2019-12-03: qty 2
  Filled 2019-12-03: qty 1
  Filled 2019-12-03: qty 2

## 2019-12-03 MED ORDER — SODIUM CHLORIDE 0.9 % IV SOLN: INTRAVENOUS | Status: DC

## 2019-12-03 MED ORDER — SODIUM BICARBONATE 650 MG PO TABS
650.0000 mg | ORAL_TABLET | Freq: Two times a day (BID) | ORAL | Status: DC
  Administered 2019-12-04 – 2019-12-05 (×3): 650 mg via ORAL
  Filled 2019-12-03 (×4): qty 1

## 2019-12-03 MED ORDER — PANTOPRAZOLE SODIUM 40 MG PO TBEC
40.0000 mg | DELAYED_RELEASE_TABLET | Freq: Every day | ORAL | Status: DC
  Administered 2019-12-03 – 2019-12-05 (×3): 40 mg via ORAL
  Filled 2019-12-03 (×3): qty 1

## 2019-12-03 MED ORDER — BUSPIRONE HCL 10 MG PO TABS
10.0000 mg | ORAL_TABLET | Freq: Two times a day (BID) | ORAL | Status: DC
  Administered 2019-12-04 – 2019-12-05 (×3): 10 mg via ORAL
  Filled 2019-12-03 (×4): qty 1

## 2019-12-03 MED ORDER — METOCLOPRAMIDE HCL 5 MG/ML IJ SOLN
5.0000 mg | Freq: Four times a day (QID) | INTRAMUSCULAR | Status: DC
  Administered 2019-12-04 – 2019-12-05 (×5): 5 mg via INTRAVENOUS
  Filled 2019-12-03 (×5): qty 2

## 2019-12-03 MED ORDER — HYDRALAZINE HCL 50 MG PO TABS
75.0000 mg | ORAL_TABLET | Freq: Three times a day (TID) | ORAL | Status: DC
  Administered 2019-12-03 – 2019-12-05 (×5): 75 mg via ORAL
  Filled 2019-12-03: qty 2
  Filled 2019-12-03: qty 1.5
  Filled 2019-12-03 (×3): qty 2

## 2019-12-03 MED ORDER — LEVOTHYROXINE SODIUM 100 MCG PO TABS
200.0000 ug | ORAL_TABLET | Freq: Every day | ORAL | Status: DC
  Administered 2019-12-03 – 2019-12-05 (×3): 200 ug via ORAL
  Filled 2019-12-03: qty 2
  Filled 2019-12-03: qty 1
  Filled 2019-12-03: qty 2

## 2019-12-03 MED ORDER — HYDROCODONE-ACETAMINOPHEN 5-325 MG PO TABS
1.0000 | ORAL_TABLET | ORAL | Status: DC | PRN
  Administered 2019-12-03: 1 via ORAL
  Filled 2019-12-03: qty 1

## 2019-12-03 MED ORDER — DEXTROSE 50 % IV SOLN
0.0000 mL | INTRAVENOUS | Status: DC | PRN
  Administered 2019-12-04: 25 mL via INTRAVENOUS
  Filled 2019-12-03: qty 50

## 2019-12-03 MED ORDER — PROMETHAZINE HCL 25 MG/ML IJ SOLN: 12.5000 mg | Freq: Four times a day (QID) | INTRAMUSCULAR | Status: DC | PRN

## 2019-12-03 NOTE — Progress Notes (Signed)
Inpatient Diabetes Program Recommendations  AACE/ADA: New Consensus Statement on Inpatient Glycemic Control (2015)  Target Ranges:  Prepandial:   less than 140 mg/dL      Peak postprandial:   less than 180 mg/dL (1-2 hours)      Critically ill patients:  140 - 180 mg/dL   Lab Results  Component Value Date   GLUCAP >600 (HH) 12/03/2019   HGBA1C 10.5 (A) 08/01/2019    Review of Glycemic Control  Diabetes history: DM type 1 Outpatient Diabetes medications: T slim insulin pump with Dexcom CGM  Basal rate 0.48 units/hour        Total Daily basal 11.52 in 24 hour period Carbohydrate coverage 1 units of insulin for every 20 grams of carbs Correction 1 units of insulin drops gkucose 75 points Target glucose 120 mg/dl  Tresiba 40 units Daily as back up basal insulin  Current orders for Inpatient glycemic control: IV insulin gtt per DKA protocol  Inpatient Diabetes Program Recommendations:    Spoke with pt at bedside. Pt sees Dr. Denton Lank, Endocrinologist with [facility]. Patient had her initial visit 09/09/19 and had significant changes to her insulin pump to lower her doses. Per patient her trends were good up until 4-6 weeks ago then her trends became elevated again. Pt did not call her Endo.  Patient has insulin pump location on her right lower abd and her dexcom CGM on her left lower abd. (dexcom applied 12/15) (Insulin pump site applied 12/13).  Spoke with patient in regards to glucose control. Pt feels she is doing better covering her sugary beverages and other changes her Endocrinologist suggest she make.  Insulin pump working proper. Her pump notifies her if there is an occlusion in the line.  Discussed IV insulin protocol to pt.  Due to her insulin pump dosing under insulinizing pt will suggest basal bolus at time of d/c and have pt call and follow up with her Endocrinologist.  Patient agreeable to plan.  Thanks,  Tama Headings RN, MSN, BC-ADM Inpatient Diabetes  Coordinator Team Pager [phone] (8a-5p)
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
## 2019-12-03 NOTE — ED Notes (Signed)
purewick placed on pt.
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
## 2019-12-03 NOTE — ED Notes (Signed)
Date and time results received: 12/03/19 2:47 PM (use smartphrase ".now" to insert current time)  Test: Glucose Critical Value: 904  Name of Provider Notified: Ova Freshwater  Orders Received? Or Actions Taken?: Orders Received - See Orders for details

## 2019-12-03 NOTE — ED Triage Notes (Signed)
Pt presents with c/o hyperglycemia. Pt has a hx of hyperglycemia, MD concerned for DKA. CBG reading HI in triage. Family at bedside reports that they have been giving her insulin along with her pump with no results. Pt is sleepy and hard to arouse. Family reports she has been out of it.
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
## 2019-12-04 LAB — COMPREHENSIVE METABOLIC PANEL
ALT: 15 U/L (ref 0–44)
AST: 17 U/L (ref 15–41)
Albumin: 3.9 g/dL (ref 3.5–5.0)
Alkaline Phosphatase: 89 U/L (ref 38–126)
Anion gap: 8 (ref 5–15)
BUN: 30 mg/dL — ABNORMAL HIGH (ref 6–20)
CO2: 21 mmol/L — ABNORMAL LOW (ref 22–32)
Calcium: 9.6 mg/dL (ref 8.9–10.3)
Chloride: 111 mmol/L (ref 98–111)
Creatinine, Ser: 3.23 mg/dL — ABNORMAL HIGH (ref 0.44–1.00)
GFR calc Af Amer: 21 mL/min — ABNORMAL LOW (ref >60)
GFR calc non Af Amer: 18 mL/min — ABNORMAL LOW (ref >60)
Glucose, Bld: 94 mg/dL (ref 70–99)
Potassium: 4.1 mmol/L (ref 3.5–5.1)
Sodium: 140 mmol/L (ref 135–145)
Total Bilirubin: 0.8 mg/dL (ref 0.3–1.2)
Total Protein: 6.6 g/dL (ref 6.5–8.1)

## 2019-12-04 LAB — CBC
HCT: 29.7 % — ABNORMAL LOW (ref 36.0–46.0)
Hemoglobin: 9.1 g/dL — ABNORMAL LOW (ref 12.0–15.0)
MCH: 27.4 pg (ref 26.0–34.0)
MCHC: 30.6 g/dL (ref 30.0–36.0)
MCV: 89.5 fL (ref 80.0–100.0)
Platelets: 124 10*3/uL — ABNORMAL LOW (ref 150–400)
RBC: 3.32 MIL/uL — ABNORMAL LOW (ref 3.87–5.11)
RDW: 11.3 % — ABNORMAL LOW (ref 11.5–15.5)
WBC: 6.3 10*3/uL (ref 4.0–10.5)
nRBC: 0 % (ref 0.0–0.2)

## 2019-12-04 LAB — GLUCOSE, CAPILLARY
Glucose-Capillary: 110 mg/dL — ABNORMAL HIGH (ref 70–99)
Glucose-Capillary: 137 mg/dL — ABNORMAL HIGH (ref 70–99)
Glucose-Capillary: 265 mg/dL — ABNORMAL HIGH (ref 70–99)
Glucose-Capillary: 54 mg/dL — ABNORMAL LOW (ref 70–99)
Glucose-Capillary: 59 mg/dL — ABNORMAL LOW (ref 70–99)
Glucose-Capillary: 65 mg/dL — ABNORMAL LOW (ref 70–99)
Glucose-Capillary: 71 mg/dL (ref 70–99)
Glucose-Capillary: 75 mg/dL (ref 70–99)
Glucose-Capillary: 77 mg/dL (ref 70–99)
Glucose-Capillary: 90 mg/dL (ref 70–99)
Glucose-Capillary: 91 mg/dL (ref 70–99)
Glucose-Capillary: 91 mg/dL (ref 70–99)

## 2019-12-04 LAB — HEMOGLOBIN A1C
Hgb A1c MFr Bld: 11.5 % — ABNORMAL HIGH (ref 4.8–5.6)
Mean Plasma Glucose: 283.35 mg/dL

## 2019-12-04 LAB — TRIGLYCERIDES: Triglycerides: 181 mg/dL — ABNORMAL HIGH (ref <150)

## 2019-12-04 MED ORDER — INSULIN ASPART 100 UNIT/ML ~~LOC~~ SOLN
0.0000 [IU] | SUBCUTANEOUS | Status: DC
  Administered 2019-12-05: 3 [IU] via SUBCUTANEOUS

## 2019-12-04 MED ORDER — DEXTROSE 50 % IV SOLN: 12.5000 g | Freq: Once | INTRAVENOUS | Status: DC

## 2019-12-04 NOTE — Progress Notes (Signed)
CBG 75, juice given. Encouraged patient to order something to eat for lunch since nausea medication was just given. Will recheck CBG in 30 minutes.

## 2019-12-04 NOTE — Progress Notes (Addendum)
Inpatient Diabetes Program Recommendations  AACE/ADA: New Consensus Statement on Inpatient Glycemic Control (2015)  Target Ranges:  Prepandial:   less than 140 mg/dL      Peak postprandial:   less than 180 mg/dL (1-2 hours)      Critically ill patients:  140 - 180 mg/dL   Lab Results  Component Value Date   GLUCAP 77 12/04/2019   HGBA1C 10.5 (A) 08/01/2019    Review of Glycemic Control Results for Angela, [name] (MRN [id number]) as of 12/04/2019 08:56  Ref. Range 12/03/2019 21:37 12/03/2019 22:04 12/03/2019 23:40 12/04/2019 00:22 12/04/2019 00:52 12/04/2019 04:34 12/04/2019 05:41 12/04/2019 07:25  Glucose-Capillary Latest Ref Range: 70 - 99 mg/dL 133 (H)   Lantus 10 units given at 2055 pm 136 (H)  Novolog 1 unit given 93 59 (L) 137 (H) 54 (L) 91 77   Diabetes history: DM type 1 Outpatient Diabetes medications: T slim insulin pump with Dexcom CGM  Basal rate 0.48 units/hour        Total Daily basal 11.52 in 24 hour period Carbohydrate coverage 1 units of insulin for every 20 grams of carbs Correction 1 units of insulin drops gkucose 75 points Target glucose 120 mg/dl  Tresiba 40 units Daily as back up basal insulin  Current orders for Inpatient glycemic control: Lantus 10 units Novolog 0-9 units tid  Novolog 3 units tid meal coverage  Inpatient Diabetes Program Recommendations:    Hypoglycemia in the 50's when given 1 units of Novolog last night.  Decrease Novolog Correction scale to "very sensitive" 0-6 units tid with coverage starting at 151 mg/dl.  Discussed plan of care with Dr. Pietro Cassis. New orders recieved.   For D/C:  Due to her insulin pump dosing under insulinizing pt will suggest basal bolus at time of d/c and have pt call and follow up with her Endocrinologist.  Patient agreeable to plan.  Home regimen to be d/c'd on:  Tresiba 10 units Daily Humalog or Novolog 0-6 units tid with meals coverage starting at 151 mg/dl Novolog meal coverage if patient  consumes 75% of meal. (will determine dose as I monitor glucose trends today).  Thanks,  Angela Headings RN, MSN, BC-ADM Inpatient Diabetes Coordinator Team Pager [phone] (8a-5p)

## 2019-12-04 NOTE — Progress Notes (Signed)
Hypoglycemic Event  CBG: 59   Treatment: D50 25 mL (12.5 gm)  Symptoms: None  Follow-up CBG: Time:1250 CBG Result:137  Possible Reasons for Event: Vomiting  Comments/MD notified:Will continue to monitor    Horald Pollen

## 2019-12-04 NOTE — Progress Notes (Signed)
PROGRESS NOTE  Angela [name]  DOB: 1986/10/27  PCP: [name], MD [id number]  DOA: 12/03/2019  LOS: 1 day   Chief Complaint  Patient presents with  . Hyperglycemia   Brief narrative: Patient is a 32 y.o. female with medical history significant for diabetes mellitus type 1, asthma, CKD stage III, hypertension, hypothyroidism. She presented to the ED on 12/15 with complaint of progressively worsening lethargy for few days associated with generalized abdominal pain, nausea, vomiting and inability to tolerate oral intake. Of note, patient has an insulin pump for more than a year and follows up with her endocrinologist at [facility].  Patient last visits to her endocrinologist was in September 2020 and had made changes to her insulin pump to lower her doses.  For the past month patient had noted elevated blood sugar readings.   In the ED, patient had elevated blood pressure up to 214/117. Work-up showed blood glucose level elevated over 900, sodium level low at 126, potassium level elevated to 5.4, creatinine elevated to 4.36 (baseline 3.5-3.76), normal serum bicarb, anion gap, no ketones in urine. Calculated serum osmolality 316 mOsm/kg UA negative for infection, no ketones Chest x-ray unremarkable Patient was admitted to hospitalist service for hyperosmolar hyperglycemic state  Subjective: Patient was seen and examined this morning.  Pleasant young African-American female.  Feels tired.  No nausea or vomiting.  She states that her insulin pump is not nonfunctional, currently off.  Assessment/Plan: Hyperosmolar hyperglycemic state Type 1 diabetes mellitus -A1c 11.5 -CBG on admission above 900, bicarb, anion gap within normal limits -Calculated serum osmolality 316 mOsm/kg -Etiology: No evidence of infection, patient states her insulin pump is working good. Reports recently adjusted insulin level in the pump. Also suspect noncompliance with diet.   -Initially patient was  started on insulin drip in the ED. Blood sugar level rapidly corrected and dropped down to as low as 54. -Patient was then switched to Lantus and sliding scale insulin. -medium carb regular consistency diet started this morning. -Monitor blood sugar level. -Plan to resume her insulin pump from tomorrow. -Diabetes care coordinator consult appreciated.  Hypertensive crisis -Presented with blood pressure elevated up to 214/117.   -Home meds include Lasix 40 mg daily, losartan 50 mg daily, amlodipine 10 mg daily, Cardizem 240 mg daily, hydralazine 75 mg 3 times daily, -Continue Cardizem, hydralazine, amlodipine.  Lasix and losartan on hold.  Blood pressure in normal range.  AKI on CKD stage IV -creatinine elevated to 4.36 on admission (baseline 3.5-3.76) -Creatinine improving gradually, 3.23 this morning. -Continue normal saline.  Lasix and losartan on hold. -Repeat BMP in the morning.  Hypothyroidism Continue Synthroid  GERD Continue PPI  Hyperlipidemia Continue pravastatin  Mobility: Encourage ambulation Diet: Diabetic diet Fluid: Normal saline at 75 mL/h DVT prophylaxis:  Heparin subcu Code Status:  Full code Family Communication:  None at bedside Expected Discharge:  Hopefully home tomorrow  Consultants:  None  Procedures:  None  Antimicrobials: Anti-infectives (From admission, onward)   None        Code Status: Full Code   Diet Order            Diet heart healthy/carb modified Room service appropriate? Yes; Fluid consistency: Thin  Diet effective now              Infusions:  . sodium chloride 75 mL/hr at 12/04/19 1600    Scheduled Meds: . amLODipine  10 mg Oral Daily  . busPIRone  10 mg Oral BID  . Chlorhexidine  Gluconate Cloth  6 each Topical Daily  . diltiazem  240 mg Oral Daily  . escitalopram  20 mg Oral Daily  . gabapentin  300 mg Oral BID  . heparin  5,000 Units Subcutaneous Q8H  . hydrALAZINE  75 mg Oral TID  . insulin aspart  0-6  Units Subcutaneous Q4H  . insulin aspart  3 Units Subcutaneous TID WC  . insulin glargine  10 Units Subcutaneous Daily  . levothyroxine  200 mcg Oral Daily  . metoCLOPramide (REGLAN) injection  5 mg Intravenous Q6H  . pantoprazole  40 mg Oral Daily  . pravastatin  20 mg Oral q1800  . sodium bicarbonate  650 mg Oral BID    PRN meds: acetaminophen **OR** acetaminophen, albuterol, dextrose, HYDROcodone-acetaminophen, ondansetron (ZOFRAN) IV, polyethylene glycol, promethazine   Objective: Vitals:   12/04/19 1600 12/04/19 1700  BP: 123/61 113/62  Pulse: 84 81  Resp: (!) 22 15  Temp:    SpO2: 100% 98%    Intake/Output Summary (Last 24 hours) at 12/04/2019 1716 Last data filed at 12/04/2019 1600 Gross per 24 hour  Intake 1838.17 ml  Output 50 ml  Net 1788.17 ml   Filed Weights   12/03/19 1531  Weight: 64.4 kg   Weight change:  Body mass index is 24.37 kg/m.   Physical Exam: General exam: Appears calm and comfortable.  Skin: No rashes, lesions or ulcers. HEENT: Atraumatic, normocephalic, supple neck, no obvious bleeding Lungs: Clear to auscultation bilaterally CVS: Regular rate and rhythm, no murmur GI/Abd soft, nontender, nondistended, bowel sound present CNS: Alert, awake, oriented x3 Psychiatry: Mood appropriate Extremities: No pedal edema, no calf tenderness  Data Review: I have personally reviewed the laboratory data and studies available.  Recent Labs  Lab 12/03/19 1327 12/04/19 0220  WBC 3.9* 6.3  NEUTROABS 3.0  --   HGB 10.7* 9.1*  HCT 34.6* 29.7*  MCV 88.9 89.5  PLT 140* 124*   Recent Labs  Lab 12/03/19 1327 12/03/19 1757 12/03/19 1918 12/04/19 0220  NA 126* 133* 138 140  K 5.4* 4.1 4.0 4.1  CL 91* 105 107 111  CO2 23 19* 23 21*  GLUCOSE 904* 620* 197* 94  BUN 39* 32* 34* 30*  CREATININE 4.36* 3.32* 3.44* 3.23*  CALCIUM 9.8 8.2* 9.2 9.6  MG 2.1  --   --   --     Terrilee Croak, MD  [facility] 12/04/2019

## 2019-12-04 NOTE — Progress Notes (Signed)
Patient CBG at 0927 was 90. Per MD to hold lantus for now until her CBG goes up this afternoon. Patient had some vomiting and nausea throughout the night and still has not had the appetite to eat anything.

## 2019-12-04 NOTE — Progress Notes (Signed)
Hypoglycemic Event  CBG: 54  Treatment: 4 oz juice/soda  Symptoms: None  Follow-up CBG: Time:0541 CBG Result:91  Possible Reasons for Event: Vomiting  Comments/MD notified:Will continue to monitor    Horald Pollen

## 2019-12-05 LAB — GLUCOSE, CAPILLARY
Glucose-Capillary: 126 mg/dL — ABNORMAL HIGH (ref 70–99)
Glucose-Capillary: 83 mg/dL (ref 70–99)

## 2019-12-05 LAB — CBC WITH DIFFERENTIAL/PLATELET
Abs Immature Granulocytes: 0.02 10*3/uL (ref 0.00–0.07)
Basophils Absolute: 0 10*3/uL (ref 0.0–0.1)
Basophils Relative: 0 %
Eosinophils Absolute: 0 10*3/uL (ref 0.0–0.5)
Eosinophils Relative: 0 %
HCT: 28.6 % — ABNORMAL LOW (ref 36.0–46.0)
Hemoglobin: 8.6 g/dL — ABNORMAL LOW (ref 12.0–15.0)
Immature Granulocytes: 0 %
Lymphocytes Relative: 25 %
Lymphs Abs: 1.7 10*3/uL (ref 0.7–4.0)
MCH: 27.6 pg (ref 26.0–34.0)
MCHC: 30.1 g/dL (ref 30.0–36.0)
MCV: 91.7 fL (ref 80.0–100.0)
Monocytes Absolute: 0.4 10*3/uL (ref 0.1–1.0)
Monocytes Relative: 6 %
Neutro Abs: 4.5 10*3/uL (ref 1.7–7.7)
Neutrophils Relative %: 69 %
Platelets: 118 10*3/uL — ABNORMAL LOW (ref 150–400)
RBC: 3.12 MIL/uL — ABNORMAL LOW (ref 3.87–5.11)
RDW: 11.5 % (ref 11.5–15.5)
WBC: 6.6 10*3/uL (ref 4.0–10.5)
nRBC: 0 % (ref 0.0–0.2)

## 2019-12-05 LAB — COMPREHENSIVE METABOLIC PANEL
ALT: 13 U/L (ref 0–44)
AST: 18 U/L (ref 15–41)
Albumin: 3.2 g/dL — ABNORMAL LOW (ref 3.5–5.0)
Alkaline Phosphatase: 83 U/L (ref 38–126)
Anion gap: 9 (ref 5–15)
BUN: 27 mg/dL — ABNORMAL HIGH (ref 6–20)
CO2: 19 mmol/L — ABNORMAL LOW (ref 22–32)
Calcium: 9 mg/dL (ref 8.9–10.3)
Chloride: 108 mmol/L (ref 98–111)
Creatinine, Ser: 3.76 mg/dL — ABNORMAL HIGH (ref 0.44–1.00)
GFR calc Af Amer: 17 mL/min — ABNORMAL LOW (ref >60)
GFR calc non Af Amer: 15 mL/min — ABNORMAL LOW (ref >60)
Glucose, Bld: 250 mg/dL — ABNORMAL HIGH (ref 70–99)
Potassium: 4.4 mmol/L (ref 3.5–5.1)
Sodium: 136 mmol/L (ref 135–145)
Total Bilirubin: 0.5 mg/dL (ref 0.3–1.2)
Total Protein: 5.7 g/dL — ABNORMAL LOW (ref 6.5–8.1)

## 2019-12-05 LAB — PHOSPHORUS: Phosphorus: 2.8 mg/dL (ref 2.5–4.6)

## 2019-12-05 LAB — MAGNESIUM: Magnesium: 1.7 mg/dL (ref 1.7–2.4)

## 2019-12-05 NOTE — Progress Notes (Signed)
Hypoglycemic Event  CBG: 65  Treatment: 4 oz juice/soda  Symptoms: None  Follow-up CBG: Time:2108 CBG Result:71  Possible Reasons for Event: Inadequate meal intake  Comments/MD notified:Will continue to monitor    Horald Pollen

## 2019-12-05 NOTE — Progress Notes (Signed)
Results for RAYLYN, HILDENBRANDT (MRN [id number]) as of 12/05/2019 08:49  Ref. Range 12/04/2019 20:33 12/04/2019 21:08 12/04/2019 23:37 12/05/2019 04:35 12/05/2019 07:53  Glucose-Capillary Latest Ref Range: 70 - 99 mg/dL 65 (L) 71 265 (H) 126 (H) 83  Noted that patient's CBGs have been less than 100 mg/dl.  Recommend changing Novolog 0-6 units correction scale to TID if patient is eating.   Harvel Ricks RN BSN CDE Diabetes Coordinator Pager: [phone]  8am-5pm

## 2019-12-05 NOTE — Discharge Summary (Signed)
Physician Discharge Summary  Angela [name] [id number] DOB: Dec 07, 1986 DOA: 12/03/2019  PCP: Angela Pier, MD  Admit date: 12/03/2019 Discharge date: 12/05/2019  Admitted From: Home Discharge disposition: Home   Code Status: Full Code  Diet Recommendation: Diabetic diet   Recommendations for Outpatient Follow-Up:   1. Follow-up with PCP as an outpatient 2. Follow-up with endocrinologist as an outpatient.  Discharge Diagnosis:   Active Problems:   Hyperosmolar hyperglycemic state (HHS) ([place])    History of Present Illness / Brief narrative:  Patientis a 33 y.o.femalewith medical history significant fordiabetes mellitus type 1, asthma, CKD stage III, hypertension, hypothyroidism. She presented to the ED on 12/15 with complaint of progressively worsening lethargy for few days associated with generalized abdominal pain, nausea, vomiting and inability to tolerate oral intake. Of note, patient has an insulin pump for more than a year and follows up with her endocrinologist at [facility]. Patient last visits to her endocrinologist was in September 2020 and had made changes to her insulin pump to lower her doses. For the past month patient had noted elevated blood sugar readings.  In the ED, patient had elevated blood pressure up to 214/117. Work-up showed blood glucose level elevated over 900, sodium level low at 126, potassium level elevated to 5.4, creatinine elevated to 4.36 (baseline 3.5-3.76), normal serum bicarb, anion gap, no ketones in urine. Calculated serum osmolality 316 mOsm/kg UA negative for infection, no ketones Chest x-ray unremarkable Patient was admitted to hospitalist service for hyperosmolar hyperglycemic state  Hospital Course:  Hyperosmolar hyperglycemic state Type 1 diabetes mellitus -A1c 11.5 -CBG on admission above 900,bicarb, anion gap within normal limits -Calculated serum osmolality 316 mOsm/kg -Etiology: No evidence of infection,  patient states her insulin pump is working good. Reports recently adjusted insulin level in the pump. Also suspect noncompliance with diet.   -Initially patient was started on insulin drip in the ED. Blood sugar level rapidly corrected and dropped down to as low as 54.  -Patient was subsequently switched to Lantus and sliding scale insulin.  In last 24 hours, patient received only 10 units of Lantus, blood sugar has mostly been under 200.  Patient's blood sugar was low at 60s this morning.  She does not want to take Lantus this morning.  She states that she feels comfortable going home today.  As soon as she gets home, she will start back on insulin pump.  Hypertensive crisis -Presented with blood pressure elevated up to 214/117.  At home, patient was not able to take medicine because of nausea.  I also suspect some degree of noncompliance. -Home meds include Lasix 40 mg daily, losartan 50 mg daily, amlodipine 10 mg daily, Cardizem 240 mg daily, hydralazine 75 mg 3 times daily, -Continue Cardizem, hydralazine, amlodipine.  Lasix and losartan on hold.  Blood pressure in normal range.  AKI on CKD stage IV -creatinine elevated to 4.36 on admission (baseline 3.5-3.76) -Creatinine improving gradually, back to baseline now. -Resume Lasix and losartan post discharge -Patient sees nephrologist Dr. Edsel [name] at [facility].  Hypothyroidism Continue Synthroid  GERD Continue PPI  Hyperlipidemia Continue pravastatin  Subjective:  Seen and examined this morning.  Pleasant.  Not in distress.  Feels ready to go home.  Discharge Exam:   Vitals:   12/05/19 0800 12/05/19 1010 12/05/19 1011 12/05/19 1013  BP:  139/73 139/73 139/73  Pulse:      Resp:      Temp: 98.8 F (37.1 C)     TempSrc: Oral  SpO2:      Weight:      Height:        Body mass index is 24.37 kg/m.  General exam: Appears calm and comfortable.  Skin: No rashes, lesions or ulcers. HEENT: Atraumatic, normocephalic,  supple neck, no obvious bleeding Lungs: Clear to auscultation bilaterally CVS: Regular rate and rhythm, no murmur GI/Abd soft, nontender, nondistended, bowel sound present CNS: Alert, awake monitor x3 Psychiatry: Mood appropriate Extremities: No pedal edema, no calf tenderness  Discharge Instructions:  Wound care: None Discharge Instructions    Diet Carb Modified   Complete by: As directed    Increase activity slowly   Complete by: As directed       Allergies as of 12/05/2019      Reactions   Ferumoxytol Itching   Lisinopril Other (See Comments)   hyperkalemia   Sulfamethoxazole Hives, Itching   Trimethoprim Hives      Medication List    TAKE these medications   albuterol 108 (90 Base) MCG/ACT inhaler Commonly known as: VENTOLIN HFA Inhale 2 puffs into the lungs every 6 (six) hours as needed for wheezing or shortness of breath.   amLODipine 10 MG tablet Commonly known as: NORVASC Take 1 tablet (10 mg total) by mouth daily.   Blood Pressure Monitor Devi Use as directed to check home blood pressure 2-3 times a week   busPIRone 10 MG tablet Commonly known as: BUSPAR Take 1 tablet (10 mg total) by mouth 2 (two) times daily.   diltiazem 240 MG 24 hr capsule Commonly known as: Cartia XT Take 1 capsule (240 mg total) by mouth daily.   escitalopram 20 MG tablet Commonly known as: LEXAPRO Take 1 tablet (20 mg total) by mouth daily.   furosemide 40 MG tablet Commonly known as: LASIX Take 40 mg by mouth daily.   gabapentin 300 MG capsule Commonly known as: NEURONTIN TAKE 1 CAPSULE BY MOUTH 2 TIMES DAILY. What changed:   how much to take  how to take this  when to take this  additional instructions   hydrALAZINE 50 MG tablet Commonly known as: APRESOLINE Take 1.5 tablets (75 mg total) by mouth 3 (three) times daily.   insulin aspart 100 UNIT/ML injection Commonly known as: NovoLOG USE AS DIRECTED IN INSULIN PUMP What changed:   how to take  this  when to take this   Insulin Pen Needle 32G X 4 MM Misc Used to give insulin injections four times daily.   Insulin Syringe-Needle U-100 31G X 15/64" 1 ML Misc Commonly known as: BD Insulin Syringe Ultrafine Used to give daily insulin injections.   levothyroxine 200 MCG tablet Commonly known as: SYNTHROID Take 200 mcg by mouth daily.   loratadine 10 MG tablet Commonly known as: CLARITIN 1 tab every 24-48 hours PRN What changed:   how much to take  how to take this  when to take this  reasons to take this  additional instructions   losartan 50 MG tablet Commonly known as: COZAAR Take 50 mg by mouth daily.   metoCLOPramide 5 MG tablet Commonly known as: Reglan Take 1 tablet (5 mg total) by mouth 3 (three) times daily as needed for nausea or vomiting.   pantoprazole 40 MG tablet Commonly known as: PROTONIX Take 1 tablet (40 mg total) by mouth daily.   pravastatin 20 MG tablet Commonly known as: PRAVACHOL Take 1 tablet (20 mg total) by mouth daily.   sodium bicarbonate 650 MG tablet Take 1 tablet (650 mg  total) by mouth 2 (two) times daily.       Time coordinating discharge: 35 minutes  The results of significant diagnostics from this hospitalization (including imaging, microbiology, ancillary and laboratory) are listed below for reference.    Procedures and Diagnostic Studies:   DG Chest Port 1 View  Result Date: 12/03/2019 CLINICAL DATA:  Hyperglycemia. EXAM: PORTABLE CHEST 1 VIEW COMPARISON:  PA and lateral chest 08/18/2019. FINDINGS: Lungs clear. Heart size normal. No pneumothorax or pleural effusion. No acute bony abnormality. Right fifth and sixth rib fractures demonstrate progressive healing since the prior exam. IMPRESSION: No acute disease. Electronically Signed   By: Inge Rise M.D.   On: 12/03/2019 18:44     Labs:   Basic Metabolic Panel: Recent Labs  Lab 12/03/19 1327 12/03/19 1757 12/03/19 1918 12/04/19 0220 12/05/19 0140   NA 126* 133* 138 140 136  K 5.4* 4.1 4.0 4.1 4.4  CL 91* 105 107 111 108  CO2 23 19* 23 21* 19*  GLUCOSE 904* 620* 197* 94 250*  BUN 39* 32* 34* 30* 27*  CREATININE 4.36* 3.32* 3.44* 3.23* 3.76*  CALCIUM 9.8 8.2* 9.2 9.6 9.0  MG 2.1  --   --   --  1.7  PHOS  --   --   --   --  2.8   GFR Estimated Creatinine Clearance: 18.2 mL/min (A) (by C-G formula based on SCr of 3.76 mg/dL (H)). Liver Function Tests: Recent Labs  Lab 12/03/19 1327 12/04/19 0220 12/05/19 0140  AST 21 17 18   ALT 20 15 13   ALKPHOS 127* 89 83  BILITOT 0.8 0.8 0.5  PROT 7.2 6.6 5.7*  ALBUMIN 4.2 3.9 3.2*   Recent Labs  Lab 12/03/19 1327  LIPASE 37   No results for input(s): AMMONIA in the last 168 hours. Coagulation profile No results for input(s): INR, PROTIME in the last 168 hours.  CBC: Recent Labs  Lab 12/03/19 1327 12/04/19 0220 12/05/19 0140  WBC 3.9* 6.3 6.6  NEUTROABS 3.0  --  4.5  HGB 10.7* 9.1* 8.6*  HCT 34.6* 29.7* 28.6*  MCV 88.9 89.5 91.7  PLT 140* 124* 118*   Cardiac Enzymes: No results for input(s): CKTOTAL, CKMB, CKMBINDEX, TROPONINI in the last 168 hours. BNP: Invalid input(s): POCBNP CBG: Recent Labs  Lab 12/04/19 2033 12/04/19 2108 12/04/19 2337 12/05/19 0435 12/05/19 0753  GLUCAP 65* 71 265* 126* 83   D-Dimer No results for input(s): DDIMER in the last 72 hours. Hgb A1c Recent Labs    12/04/19 0300  HGBA1C 11.5*   Lipid Profile Recent Labs    12/04/19 0220  TRIG 181*   Thyroid function studies No results for input(s): TSH, T4TOTAL, T3FREE, THYROIDAB in the last 72 hours.  Invalid input(s): FREET3 Anemia work up No results for input(s): VITAMINB12, FOLATE, FERRITIN, TIBC, IRON, RETICCTPCT in the last 72 hours. Microbiology Recent Results (from the past 240 hour(s))  SARS CORONAVIRUS 2 (TAT 6-24 HRS) Nasopharyngeal Nasopharyngeal Swab     Status: None   Collection Time: 12/03/19  1:28 PM   Specimen: Nasopharyngeal Swab  Result Value Ref Range  Status   SARS Coronavirus 2 NEGATIVE NEGATIVE Final    Comment: (NOTE) SARS-CoV-2 target nucleic acids are NOT DETECTED. The SARS-CoV-2 RNA is generally detectable in upper and lower respiratory specimens during the acute phase of infection. Negative results do not preclude SARS-CoV-2 infection, do not rule out co-infections with other pathogens, and should not be used as the sole basis for treatment or  other patient management decisions. Negative results must be combined with clinical observations, patient history, and epidemiological information. The expected result is Negative. Fact Sheet for Patients: SugarRoll.be Fact Sheet for Healthcare Providers: https://www.woods-mathews.com/ This test is not yet approved or cleared by the [place] FDA and  has been authorized for detection and/or diagnosis of SARS-CoV-2 by FDA under an Emergency Use Authorization (EUA). This EUA will remain  in effect (meaning this test can be used) for the duration of the COVID-19 declaration under Section 56 4(b)(1) of the Act, 21 U.S.C. section 360bbb-3(b)(1), unless the authorization is terminated or revoked sooner. Performed at [facility], [place] [address], [place], [place] [address]   MRSA PCR Screening     Status: None   Collection Time: 12/03/19  7:14 PM   Specimen: Nasal Mucosa; Nasopharyngeal  Result Value Ref Range Status   MRSA by PCR NEGATIVE NEGATIVE Final    Comment:        The GeneXpert MRSA Assay (FDA approved for NASAL specimens only), is one component of a comprehensive MRSA colonization surveillance program. It is not intended to diagnose MRSA infection nor to guide or monitor treatment for MRSA infections. Performed at [facility], [place] [address] [place], [place] [address]     Please note: You were cared for by a hospitalist during your hospital stay. Once you are discharged, your primary care physician  will handle any further medical issues. Please note that NO REFILLS for any discharge medications will be authorized once you are discharged, as it is imperative that you return to your primary care physician (or establish a relationship with a primary care physician if you do not have one) for your post hospital discharge needs so that they can reassess your need for medications and monitor your lab values.  Signed: Terrilee Croak  [facility] 12/05/2019, 12:07 PM

## 2019-12-17 DIAGNOSIS — E103591 Type 1 diabetes mellitus with proliferative diabetic retinopathy without macular edema, right eye: Secondary | ICD-10-CM | POA: Diagnosis not present

## 2019-12-18 DIAGNOSIS — E109 Type 1 diabetes mellitus without complications: Secondary | ICD-10-CM | POA: Diagnosis not present

## 2019-12-27 ENCOUNTER — Ambulatory Visit (INDEPENDENT_AMBULATORY_CARE_PROVIDER_SITE_OTHER): Payer: Medicaid Other | Admitting: Psychiatry

## 2019-12-27 ENCOUNTER — Other Ambulatory Visit: Payer: Self-pay

## 2019-12-27 ENCOUNTER — Encounter: Payer: Self-pay | Admitting: Psychiatry

## 2019-12-27 DIAGNOSIS — F3342 Major depressive disorder, recurrent, in full remission: Secondary | ICD-10-CM | POA: Diagnosis not present

## 2019-12-27 DIAGNOSIS — F418 Other specified anxiety disorders: Secondary | ICD-10-CM | POA: Diagnosis not present

## 2019-12-27 MED ORDER — ESCITALOPRAM OXALATE 20 MG PO TABS: 20.0000 mg | ORAL_TABLET | Freq: Every day | ORAL | 1 refills | Status: DC

## 2019-12-27 MED ORDER — BUSPIRONE HCL 15 MG PO TABS: 15.0000 mg | ORAL_TABLET | Freq: Two times a day (BID) | ORAL | 1 refills | Status: DC
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
## 2019-12-30 ENCOUNTER — Ambulatory Visit: Payer: Medicaid Other | Admitting: Internal Medicine

## 2019-12-30 DIAGNOSIS — Z01818 Encounter for other preprocedural examination: Secondary | ICD-10-CM | POA: Diagnosis not present

## 2019-12-30 DIAGNOSIS — E1040 Type 1 diabetes mellitus with diabetic neuropathy, unspecified: Secondary | ICD-10-CM | POA: Diagnosis not present

## 2019-12-30 DIAGNOSIS — N184 Chronic kidney disease, stage 4 (severe): Secondary | ICD-10-CM | POA: Diagnosis not present

## 2019-12-30 DIAGNOSIS — K3184 Gastroparesis: Secondary | ICD-10-CM | POA: Diagnosis not present

## 2019-12-30 DIAGNOSIS — E1043 Type 1 diabetes mellitus with diabetic autonomic (poly)neuropathy: Secondary | ICD-10-CM | POA: Diagnosis not present

## 2019-12-30 DIAGNOSIS — E039 Hypothyroidism, unspecified: Secondary | ICD-10-CM | POA: Diagnosis not present

## 2019-12-30 DIAGNOSIS — E1022 Type 1 diabetes mellitus with diabetic chronic kidney disease: Secondary | ICD-10-CM | POA: Diagnosis not present

## 2019-12-31 DIAGNOSIS — E1022 Type 1 diabetes mellitus with diabetic chronic kidney disease: Secondary | ICD-10-CM | POA: Diagnosis not present

## 2019-12-31 DIAGNOSIS — E1042 Type 1 diabetes mellitus with diabetic polyneuropathy: Secondary | ICD-10-CM | POA: Diagnosis not present

## 2019-12-31 DIAGNOSIS — N184 Chronic kidney disease, stage 4 (severe): Secondary | ICD-10-CM | POA: Diagnosis not present

## 2019-12-31 DIAGNOSIS — D638 Anemia in other chronic diseases classified elsewhere: Secondary | ICD-10-CM | POA: Diagnosis not present

## 2019-12-31 DIAGNOSIS — N2581 Secondary hyperparathyroidism of renal origin: Secondary | ICD-10-CM | POA: Diagnosis not present

## 2019-12-31 DIAGNOSIS — Z794 Long term (current) use of insulin: Secondary | ICD-10-CM | POA: Diagnosis not present

## 2019-12-31 DIAGNOSIS — K3184 Gastroparesis: Secondary | ICD-10-CM | POA: Diagnosis not present

## 2019-12-31 DIAGNOSIS — D631 Anemia in chronic kidney disease: Secondary | ICD-10-CM | POA: Diagnosis not present

## 2019-12-31 DIAGNOSIS — I129 Hypertensive chronic kidney disease with stage 1 through stage 4 chronic kidney disease, or unspecified chronic kidney disease: Secondary | ICD-10-CM | POA: Diagnosis not present

## 2019-12-31 DIAGNOSIS — R809 Proteinuria, unspecified: Secondary | ICD-10-CM | POA: Diagnosis not present

## 2019-12-31 DIAGNOSIS — E039 Hypothyroidism, unspecified: Secondary | ICD-10-CM | POA: Diagnosis not present

## 2019-12-31 DIAGNOSIS — D509 Iron deficiency anemia, unspecified: Secondary | ICD-10-CM | POA: Diagnosis not present

## 2020-01-09 ENCOUNTER — Ambulatory Visit: Payer: Medicaid Other | Attending: Internal Medicine | Admitting: Internal Medicine

## 2020-01-09 ENCOUNTER — Other Ambulatory Visit: Payer: Self-pay

## 2020-01-09 ENCOUNTER — Encounter: Payer: Self-pay | Admitting: Internal Medicine

## 2020-01-09 VITALS — BP 180/110 | HR 81 | Temp 98.6°F | Resp 16 | Wt 144.6 lb

## 2020-01-09 DIAGNOSIS — D631 Anemia in chronic kidney disease: Secondary | ICD-10-CM | POA: Diagnosis not present

## 2020-01-09 DIAGNOSIS — N2581 Secondary hyperparathyroidism of renal origin: Secondary | ICD-10-CM | POA: Insufficient documentation

## 2020-01-09 DIAGNOSIS — G43009 Migraine without aura, not intractable, without status migrainosus: Secondary | ICD-10-CM | POA: Insufficient documentation

## 2020-01-09 DIAGNOSIS — Z79899 Other long term (current) drug therapy: Secondary | ICD-10-CM | POA: Diagnosis not present

## 2020-01-09 DIAGNOSIS — Z794 Long term (current) use of insulin: Secondary | ICD-10-CM | POA: Insufficient documentation

## 2020-01-09 DIAGNOSIS — E785 Hyperlipidemia, unspecified: Secondary | ICD-10-CM | POA: Diagnosis not present

## 2020-01-09 DIAGNOSIS — N184 Chronic kidney disease, stage 4 (severe): Secondary | ICD-10-CM | POA: Diagnosis not present

## 2020-01-09 DIAGNOSIS — E1022 Type 1 diabetes mellitus with diabetic chronic kidney disease: Secondary | ICD-10-CM | POA: Insufficient documentation

## 2020-01-09 DIAGNOSIS — Z87891 Personal history of nicotine dependence: Secondary | ICD-10-CM | POA: Diagnosis not present

## 2020-01-09 DIAGNOSIS — Z8249 Family history of ischemic heart disease and other diseases of the circulatory system: Secondary | ICD-10-CM | POA: Insufficient documentation

## 2020-01-09 DIAGNOSIS — G4700 Insomnia, unspecified: Secondary | ICD-10-CM | POA: Insufficient documentation

## 2020-01-09 DIAGNOSIS — K3184 Gastroparesis: Secondary | ICD-10-CM | POA: Diagnosis not present

## 2020-01-09 DIAGNOSIS — E1029 Type 1 diabetes mellitus with other diabetic kidney complication: Secondary | ICD-10-CM | POA: Diagnosis not present

## 2020-01-09 DIAGNOSIS — F419 Anxiety disorder, unspecified: Secondary | ICD-10-CM | POA: Diagnosis not present

## 2020-01-09 DIAGNOSIS — N319 Neuromuscular dysfunction of bladder, unspecified: Secondary | ICD-10-CM | POA: Diagnosis not present

## 2020-01-09 DIAGNOSIS — E1043 Type 1 diabetes mellitus with diabetic autonomic (poly)neuropathy: Secondary | ICD-10-CM | POA: Insufficient documentation

## 2020-01-09 DIAGNOSIS — K219 Gastro-esophageal reflux disease without esophagitis: Secondary | ICD-10-CM | POA: Diagnosis not present

## 2020-01-09 DIAGNOSIS — I129 Hypertensive chronic kidney disease with stage 1 through stage 4 chronic kidney disease, or unspecified chronic kidney disease: Secondary | ICD-10-CM | POA: Insufficient documentation

## 2020-01-09 DIAGNOSIS — Z833 Family history of diabetes mellitus: Secondary | ICD-10-CM | POA: Diagnosis not present

## 2020-01-09 DIAGNOSIS — E039 Hypothyroidism, unspecified: Secondary | ICD-10-CM | POA: Diagnosis not present

## 2020-01-09 DIAGNOSIS — I10 Essential (primary) hypertension: Secondary | ICD-10-CM

## 2020-01-09 DIAGNOSIS — F3342 Major depressive disorder, recurrent, in full remission: Secondary | ICD-10-CM | POA: Insufficient documentation

## 2020-01-09 LAB — GLUCOSE, POCT (MANUAL RESULT ENTRY): POC Glucose: 126 mg/dl — AB (ref 70–99)

## 2020-01-09 NOTE — Patient Instructions (Signed)

## 2020-01-09 NOTE — Progress Notes (Signed)
Patient ID: Angela [name], female    DOB: 07/24/1986  MRN: [id number]  CC: Diabetes, Hypertension, and Headache   Subjective: Angela [name] is a 34 y.o. female who presents for chronic ds management Her concerns today include:  Pt with hx of DM type 1 with neuropathy, gastroparesis and nephropathy, HTN, CKD stage 4, ACD/IDA, hypothyroid, HL  DM: patient hospitalized last month with worsening lethargy, generalized abdominal pain, nausea, vomiting and inability to tolerate oral intake.  Blood pressure was elevated at 214/117 and blood sugar was 900.  She did not have an anion gap.  She was treated appropriately.  She saw her endocrinologist in follow-up at [facility] about 1 to 2 weeks ago.  It was found that the catheter to her insulin pump was damaged.  She was referred to gastroenterology for gastroparesis management.  Of note she is on Reglan and states that she is taking it 2-3 times a day before meals.  HTN/CKD: No device to check blood pressure.  She admits that she is not as compliant as she should be with amlodipine and hydralazine because they cause swelling in her hands and face so she does not take them every day.  She saw her nephrologist recently at [facility] and told her about it.  However she states she was told that she has to take the medications because there are few other options.   -Not on transplant list as yet.  They want her to engage with a mental health counselor consistently for about 4 months.  Patient states she has been doing that.  It looks as though they plan to initiate peritoneal dialysis soon as she awaits getting on the transplant list.  HL: taking pravastatin as prescribed.  Main concern today is chronic headaches x2 to 3 months.  At first she thought the headaches were due to having issues with her teeth on the left side.  However she has seen the dentist and had it addressed.   -Headaches are frontal and in the right occipital area.  They occur  2-3 times a day and can last for hours.    Come on suddenly. Throbbing in character +Blurred vision, dizziness, nausea Triggers: eating red meat, chicken and beginning of menses. Better -  When she lays down and sleep.   Takes Regular Tylenol 4 pills in 2-3 hr time span I asked about whether she is taking the gabapentin as this can sometimes be used as prophylaxis and migraines.  I see the last prescription in the system was from 02/2019 with 3 refills.  Patient states that she was not taking it consistently for a while but just got it filled last month and is taking it.   Patient Active Problem List   Diagnosis Date Noted  . MDD (major depressive disorder), recurrent, in full remission ([place]) 12/27/2019  . Hyperosmolar hyperglycemic state (HHS) ([place]) 12/03/2019  . Anxiety about health 10/29/2019  . Cannabis abuse, episodic 10/29/2019  . Abnormal uterine bleeding (AUB) 08/13/2019  . Ovarian mass, left 11/27/2018  . Moderate major depression ([place]) 11/27/2018  . Right flank mass 05/03/2018  . Secondary hyperparathyroidism ([place]) 05/01/2018  . Anxiety and depression 06/23/2017  . Insomnia 01/12/2016  . Non compliance with medical treatment 01/05/2016  . Hyperlipidemia 01/05/2016  . Chronic kidney disease (CKD) stage G3a/A3, moderately decreased glomerular filtration rate (GFR) between 45-59 mL/min/1.73 square meter and albuminuria creatinine ratio greater than 300 mg/g 07/28/2015  . Neurogenic bladder 02/20/2015  . Diabetic peripheral neuropathy  associated with type 1 diabetes mellitus ([place]) 02/20/2015  . Iron deficiency 02/13/2015  . Asthma 09/30/2014  . Diabetic retinopathy ([place]) 09/30/2014  . Atypical squamous cells of undetermined significance (ASCUS) on Papanicolaou smear of cervix 08/11/2014  . Diabetic gastroparesis associated with type 1 diabetes mellitus ([place]) 08/05/2014  . Anemia in chronic renal disease 08/05/2014  . Hyperkalemia 08/04/2014  . HTN (hypertension) 07/11/2012  .  GERD (gastroesophageal reflux disease) 09/26/2011  . Hypothyroidism 09/14/2006  . 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, with long-term current use of insulin ([place]) 01/15/2000     Current Outpatient Medications on File Prior to Visit  Medication Sig Dispense Refill  . albuterol (PROVENTIL HFA;VENTOLIN HFA) 108 (90 Base) MCG/ACT inhaler Inhale 2 puffs into the lungs every 6 (six) hours as needed for wheezing or shortness of breath. 1 Inhaler 6  . amLODipine (NORVASC) 10 MG tablet Take 1 tablet (10 mg total) by mouth daily. 90 tablet 3  . Blood Pressure Monitor DEVI Use as directed to check home blood pressure 2-3 times a week 1 Device 0  . busPIRone (BUSPAR) 15 MG tablet Take 1 tablet (15 mg total) by mouth 2 (two) times daily. 60 tablet 1  . diltiazem (CARTIA XT) 240 MG 24 hr capsule Take 1 capsule (240 mg total) by mouth daily. 30 capsule 3  . escitalopram (LEXAPRO) 20 MG tablet Take 1 tablet (20 mg total) by mouth daily. 30 tablet 1  . furosemide (LASIX) 40 MG tablet Take 40 mg by mouth daily.     [name] gabapentin (NEURONTIN) 300 MG capsule TAKE 1 CAPSULE BY MOUTH 2 TIMES DAILY. (Patient taking differently: Take 300 mg by mouth 2 (two) times daily. ) 60 capsule 3  . hydrALAZINE (APRESOLINE) 50 MG tablet Take 1.5 tablets (75 mg total) by mouth 3 (three) times daily. 135 tablet 6  . insulin aspart (NOVOLOG) 100 UNIT/ML injection USE AS DIRECTED IN INSULIN PUMP (Patient taking differently: Inject into the skin once. USE AS DIRECTED IN INSULIN PUMP) 10 mL 2  . Insulin Pen Needle 32G X 4 MM MISC Used to give insulin injections four times daily. 200 each 11  . Insulin Syringe-Needle U-100 (BD INSULIN SYRINGE ULTRAFINE) 31G X 15/64" 1 ML MISC Used to give daily insulin injections. 100 each 11  . levothyroxine (SYNTHROID) 200 MCG tablet Take 200 mcg by mouth daily.    [name] loratadine (CLARITIN) 10 MG tablet 1 tab every 24-48 hours PRN (Patient taking differently: Take 10 mg by mouth daily as  needed for allergies. ) 30 tablet 2  . losartan (COZAAR) 50 MG tablet Take 50 mg by mouth daily.     . metoCLOPramide (REGLAN) 5 MG tablet Take 1 tablet (5 mg total) by mouth 3 (three) times daily as needed for nausea or vomiting. 60 tablet 4  . pantoprazole (PROTONIX) 40 MG tablet Take 1 tablet (40 mg total) by mouth daily. 30 tablet 3  . pravastatin (PRAVACHOL) 20 MG tablet Take 1 tablet (20 mg total) by mouth daily. 30 tablet 6  . sodium bicarbonate 650 MG tablet Take 1 tablet (650 mg total) by mouth 2 (two) times daily. 60 tablet 4   No current facility-administered medications on file prior to visit.    Allergies  Allergen Reactions  . Ferumoxytol Itching  . Lisinopril Other (See Comments)    hyperkalemia  . Sulfamethoxazole Hives and Itching  . Trimethoprim Hives    Social History   Socioeconomic History  . Marital status: Divorced  Spouse name: Not on file  . Number of children: 0  . Years of education: 10th grade  . Highest education level: Not on file  Occupational History  . Occupation: unemployed    Comment: worked at a group  Tobacco Use  . Smoking status: Former Smoker    Packs/day: 0.25    Years: 2.00    Pack years: 0.50    Types: Cigarettes    Quit date: 03/04/2013    Years since quitting: 6.8  . Smokeless tobacco: Never Used  Substance and Sexual Activity  . Alcohol use: No    Alcohol/week: 0.0 standard drinks  . Drug use: Yes    Types: Marijuana    Comment: last used 4 months ago  . Sexual activity: Yes    Partners: Female    Birth control/protection: None    Comment: women preference   Other Topics Concern  . Not on file  Social History Narrative   Occupation: currently unemployed   Single   Homosexual,     Lives with mom.    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          Sexual History:  multiple partners in the past, same sex  encounters,current partner is a CNA and she is planning to move in with her   Drug Use:  Marijuana, denies cocaine, heroin, or amphetamines.           Social Determinants of Health   Financial Resource Strain: Low Risk   . Difficulty of Paying Living Expenses: Not hard at all  Food Insecurity: Unknown  . Worried About Charity fundraiser in the Last Year: Never true  . Ran Out of Food in the Last Year: Not on file  Transportation Needs:   . Lack of Transportation (Medical): Not on file  . Lack of Transportation (Non-Medical): Not on file  Physical Activity: Inactive  . Days of Exercise per Week: 0 days  . Minutes of Exercise per Session: 0 min  Stress:   . Feeling of Stress : Not on file  Social Connections:   . Frequency of Communication with Friends and Family: Not on file  . Frequency of Social Gatherings with Friends and Family: Not on file  . Attends Religious Services: Not on file  . Active Member of Clubs or Organizations: Not on file  . Attends Archivist Meetings: Not on file  . Marital Status: Not on file  Intimate Partner Violence:   . Fear of Current or Ex-Partner: Not on file  . Emotionally Abused: Not on file  . Physically Abused: Not on file  . Sexually Abused: Not on file    Family History  Problem Relation Age of Onset  . Multiple sclerosis Mother   . Hypothyroidism Mother   . Stroke Mother        at age 50 yo  . Hyperlipidemia Maternal Grandmother   . Hypertension Maternal Grandmother   . Heart disease Maternal Grandmother        unknown type  . Diabetes Maternal Grandmother   . Hypertension Maternal Grandfather   . Prostate cancer Maternal Grandfather   . Diabetes type I Maternal Grandfather   . Breast cancer Paternal Grandmother   . Cancer Neg Hx     Past Surgical History:  Procedure Laterality Date  . FOOT FUSION Right 2006   "put screws  in it too" (09/19/2013)    ROS: Review of Systems Negative except as stated  above  PHYSICAL EXAM: BP (!) 147/93   Pulse 81   Temp 98.6 F (37 C) (Oral)   Resp 16   Wt 144 lb 9.6 oz (65.6 kg)   SpO2 100%   BMI 24.82 kg/m   Wt Readings from Last 3 Encounters:  01/09/20 144 lb 9.6 oz (65.6 kg)  12/03/19 142 lb (64.4 kg)  09/29/19 146 lb (66.2 kg)    Physical Exam  General appearance - alert, well appearing, and in no distress Mental status - normal mood, behavior, speech, dress, motor activity, and thought processes Chest - clear to auscultation, no wheezes, rales or rhonchi, symmetric air entry Heart - normal rate, regular rhythm, normal S1, S2, no murmurs, rubs, clicks or gallops Neurological - cranial nerves II through XII intact, normal muscle tone, no tremors, strength 5/5.  Gait is normal.  Gross sensation intact in the upper extremities.   Lab Results  Component Value Date   HGBA1C 11.5 (H) 12/04/2019   Results for orders placed or performed in visit on 01/09/20  POCT glucose (manual entry)  Result Value Ref Range   POC Glucose 126 (A) 70 - 99 mg/dl     CMP Latest Ref Rng & Units 12/05/2019 12/04/2019 12/03/2019  Glucose 70 - 99 mg/dL 250(H) 94 197(H)  BUN 6 - 20 mg/dL 27(H) 30(H) 34(H)  Creatinine 0.44 - 1.00 mg/dL 3.76(H) 3.23(H) 3.44(H)  Sodium 135 - 145 mmol/L 136 140 138  Potassium 3.5 - 5.1 mmol/L 4.4 4.1 4.0  Chloride 98 - 111 mmol/L 108 111 107  CO2 22 - 32 mmol/L 19(L) 21(L) 23  Calcium 8.9 - 10.3 mg/dL 9.0 9.6 9.2  Total Protein 6.5 - 8.1 g/dL 5.7(L) 6.6 -  Total Bilirubin 0.3 - 1.2 mg/dL 0.5 0.8 -  Alkaline Phos 38 - 126 U/L 83 89 -  AST 15 - 41 U/L 18 17 -  ALT 0 - 44 U/L 13 15 -   Lipid Panel     Component Value Date/Time   CHOL 254 (H) 06/23/2017 1151   TRIG 181 (H) 12/04/2019 0220   HDL 87 06/23/2017 1151   CHOLHDL 2.9 06/23/2017 1151   CHOLHDL 3.8 02/04/2016 1703   VLDL 71 (H) 02/04/2016 1703   LDLCALC 142 (H) 06/23/2017 1151    CBC    Component Value Date/Time   WBC 6.6 12/05/2019 0140   RBC 3.12 (L)  12/05/2019 0140   HGB 8.6 (L) 12/05/2019 0140   HGB 8.7 (L) 08/09/2019 1544   HCT 28.6 (L) 12/05/2019 0140   HCT 27.7 (L) 08/09/2019 1544   PLT 118 (L) 12/05/2019 0140   PLT 161 08/09/2019 1544   MCV 91.7 12/05/2019 0140   MCV 88 08/09/2019 1544   MCH 27.6 12/05/2019 0140   MCHC 30.1 12/05/2019 0140   RDW 11.5 12/05/2019 0140   RDW 11.6 (L) 08/09/2019 1544   LYMPHSABS 1.7 12/05/2019 0140   LYMPHSABS 2.3 05/21/2018 1707   MONOABS 0.4 12/05/2019 0140   EOSABS 0.0 12/05/2019 0140   EOSABS 0.0 05/21/2018 1707   BASOSABS 0.0 12/05/2019 0140   BASOSABS 0.0 05/21/2018 1707    ASSESSMENT AND PLAN:  1. Migraine without aura and without status migrainosus, not intractable History suggests that these are migraine headaches.  Patient has multiple comorbidities which limits the use of NSAIDs and triptans.  I will refer her to neurology to determine the best course of action  for management.  She had CT scan of the head 07/2019  - Ambulatory referral to Neurology  2. Essential hypertension Not at goal.  Encourage compliance with medications as recommended by her nephrologist..  3. CKD (chronic kidney disease) stage 4, GFR 15-29 ml/min ([place]) Followed by nephrology at [facility]  4. 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, with long-term current use of insulin ([place]) Followed by endocrinology at [place] glucose (manual entry)    Patient was given the opportunity to ask questions.  Patient verbalized understanding of the plan and was able to repeat key elements of the plan.   Orders Placed This Encounter  Procedures  . POCT glucose (manual entry)     Requested Prescriptions    No prescriptions requested or ordered in this encounter    No follow-ups on file.  Karle Plumber, MD, FACP

## 2020-01-10 DIAGNOSIS — N184 Chronic kidney disease, stage 4 (severe): Secondary | ICD-10-CM | POA: Diagnosis not present

## 2020-01-10 DIAGNOSIS — E1022 Type 1 diabetes mellitus with diabetic chronic kidney disease: Secondary | ICD-10-CM | POA: Diagnosis not present

## 2020-01-10 DIAGNOSIS — D631 Anemia in chronic kidney disease: Secondary | ICD-10-CM | POA: Diagnosis not present

## 2020-01-14 DIAGNOSIS — Z79899 Other long term (current) drug therapy: Secondary | ICD-10-CM | POA: Diagnosis not present

## 2020-01-14 DIAGNOSIS — E1022 Type 1 diabetes mellitus with diabetic chronic kidney disease: Secondary | ICD-10-CM | POA: Diagnosis not present

## 2020-01-14 DIAGNOSIS — N184 Chronic kidney disease, stage 4 (severe): Secondary | ICD-10-CM | POA: Diagnosis not present

## 2020-01-14 DIAGNOSIS — K3184 Gastroparesis: Secondary | ICD-10-CM | POA: Diagnosis not present

## 2020-01-14 DIAGNOSIS — N179 Acute kidney failure, unspecified: Secondary | ICD-10-CM | POA: Diagnosis not present

## 2020-01-14 DIAGNOSIS — E1021 Type 1 diabetes mellitus with diabetic nephropathy: Secondary | ICD-10-CM | POA: Diagnosis not present

## 2020-01-14 DIAGNOSIS — E1029 Type 1 diabetes mellitus with other diabetic kidney complication: Secondary | ICD-10-CM | POA: Diagnosis not present

## 2020-01-14 DIAGNOSIS — E1043 Type 1 diabetes mellitus with diabetic autonomic (poly)neuropathy: Secondary | ICD-10-CM | POA: Diagnosis not present

## 2020-01-14 DIAGNOSIS — Z794 Long term (current) use of insulin: Secondary | ICD-10-CM | POA: Diagnosis not present

## 2020-01-14 DIAGNOSIS — I129 Hypertensive chronic kidney disease with stage 1 through stage 4 chronic kidney disease, or unspecified chronic kidney disease: Secondary | ICD-10-CM | POA: Diagnosis not present

## 2020-01-14 DIAGNOSIS — D631 Anemia in chronic kidney disease: Secondary | ICD-10-CM | POA: Diagnosis not present

## 2020-01-14 DIAGNOSIS — E039 Hypothyroidism, unspecified: Secondary | ICD-10-CM | POA: Diagnosis not present

## 2020-01-14 DIAGNOSIS — N2581 Secondary hyperparathyroidism of renal origin: Secondary | ICD-10-CM | POA: Diagnosis not present

## 2020-01-18 DIAGNOSIS — E1022 Type 1 diabetes mellitus with diabetic chronic kidney disease: Secondary | ICD-10-CM | POA: Diagnosis not present

## 2020-01-20 ENCOUNTER — Ambulatory Visit: Payer: Medicaid Other | Admitting: Neurology

## 2020-01-20 ENCOUNTER — Telehealth: Payer: Self-pay

## 2020-01-20 NOTE — Telephone Encounter (Signed)
Patient same day canceled 01/20/2020 new patient appointment with Dr. Rexene Alberts.  Patient canceled because she was not feeling well.

## 2020-01-21 ENCOUNTER — Encounter: Payer: Self-pay | Admitting: Neurology

## 2020-01-24 DIAGNOSIS — N184 Chronic kidney disease, stage 4 (severe): Secondary | ICD-10-CM | POA: Diagnosis not present

## 2020-01-24 DIAGNOSIS — D631 Anemia in chronic kidney disease: Secondary | ICD-10-CM | POA: Diagnosis not present

## 2020-01-24 DIAGNOSIS — E1022 Type 1 diabetes mellitus with diabetic chronic kidney disease: Secondary | ICD-10-CM | POA: Diagnosis not present

## 2020-02-07 DIAGNOSIS — D631 Anemia in chronic kidney disease: Secondary | ICD-10-CM | POA: Diagnosis not present

## 2020-02-07 DIAGNOSIS — N184 Chronic kidney disease, stage 4 (severe): Secondary | ICD-10-CM | POA: Diagnosis not present

## 2020-02-09 ENCOUNTER — Emergency Department (HOSPITAL_COMMUNITY)
Admission: EM | Admit: 2020-02-09 | Discharge: 2020-02-09 | Disposition: A | Discharge status: Home / Self Care | Payer: Medicaid Other | Attending: Emergency Medicine | Admitting: Emergency Medicine

## 2020-02-09 ENCOUNTER — Other Ambulatory Visit: Payer: Self-pay

## 2020-02-09 ENCOUNTER — Encounter (HOSPITAL_COMMUNITY): Payer: Self-pay | Admitting: Emergency Medicine

## 2020-02-09 DIAGNOSIS — Z87891 Personal history of nicotine dependence: Secondary | ICD-10-CM | POA: Insufficient documentation

## 2020-02-09 DIAGNOSIS — E1040 Type 1 diabetes mellitus with diabetic neuropathy, unspecified: Secondary | ICD-10-CM | POA: Diagnosis not present

## 2020-02-09 DIAGNOSIS — E1022 Type 1 diabetes mellitus with diabetic chronic kidney disease: Secondary | ICD-10-CM | POA: Diagnosis not present

## 2020-02-09 DIAGNOSIS — Z794 Long term (current) use of insulin: Secondary | ICD-10-CM | POA: Insufficient documentation

## 2020-02-09 DIAGNOSIS — Z03818 Encounter for observation for suspected exposure to other biological agents ruled out: Secondary | ICD-10-CM | POA: Diagnosis not present

## 2020-02-09 DIAGNOSIS — R030 Elevated blood-pressure reading, without diagnosis of hypertension: Secondary | ICD-10-CM

## 2020-02-09 DIAGNOSIS — I129 Hypertensive chronic kidney disease with stage 1 through stage 4 chronic kidney disease, or unspecified chronic kidney disease: Secondary | ICD-10-CM | POA: Insufficient documentation

## 2020-02-09 DIAGNOSIS — Z79899 Other long term (current) drug therapy: Secondary | ICD-10-CM | POA: Diagnosis not present

## 2020-02-09 DIAGNOSIS — Z20822 Contact with and (suspected) exposure to covid-19: Secondary | ICD-10-CM

## 2020-02-09 DIAGNOSIS — E039 Hypothyroidism, unspecified: Secondary | ICD-10-CM | POA: Insufficient documentation

## 2020-02-09 DIAGNOSIS — N1831 Chronic kidney disease, stage 3a: Secondary | ICD-10-CM | POA: Diagnosis not present

## 2020-02-09 DIAGNOSIS — I10 Essential (primary) hypertension: Secondary | ICD-10-CM | POA: Diagnosis not present

## 2020-02-09 LAB — SARS CORONAVIRUS 2 (TAT 6-24 HRS): SARS Coronavirus 2: NEGATIVE

## 2020-02-09 IMAGING — US US PELVIS COMPLETE TRANSABD/TRANSVAG
1 series · 13 of 25 positions shown · non-contrast
Comparison: CT abdomen and pelvis 11/21/2018

CLINICAL DATA: Left ovarian mass.

EXAM:
TRANSABDOMINAL AND TRANSVAGINAL ULTRASOUND OF PELVIS
TECHNIQUE: Both transabdominal and transvaginal ultrasound examinations of the
pelvis were performed. Transabdominal technique was performed for
global imaging of the pelvis including uterus, ovaries, adnexal
regions, and pelvic cul-de-sac. It was necessary to proceed with
endovaginal exam following the transabdominal exam to visualize the
right ovary.

[Series 1: us pelvis complete transabd/transvag · 0.19mm/px · 13 of 107 slices shown]
[im 1/107]
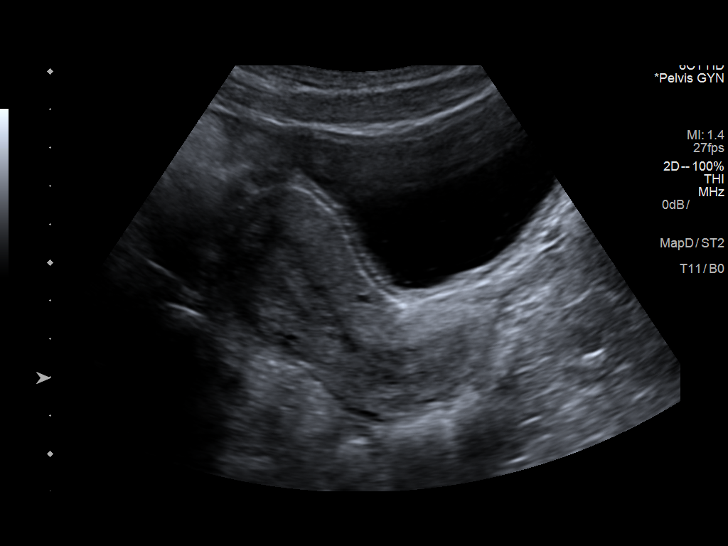
[im 9/107]
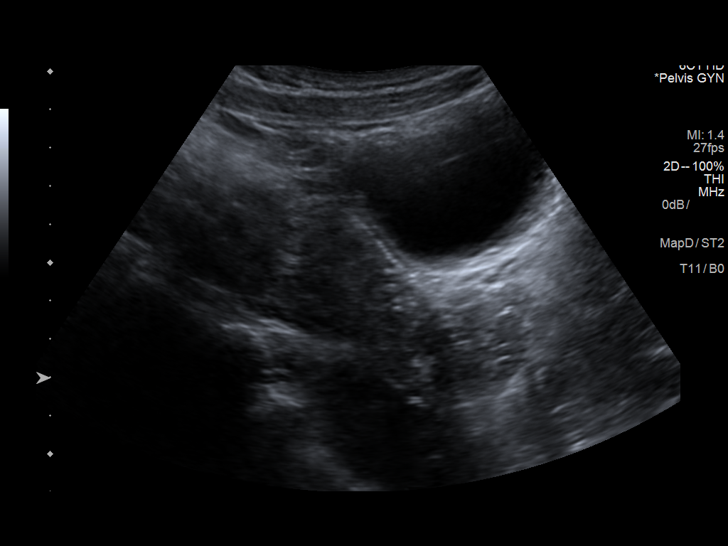
[im 18/107]
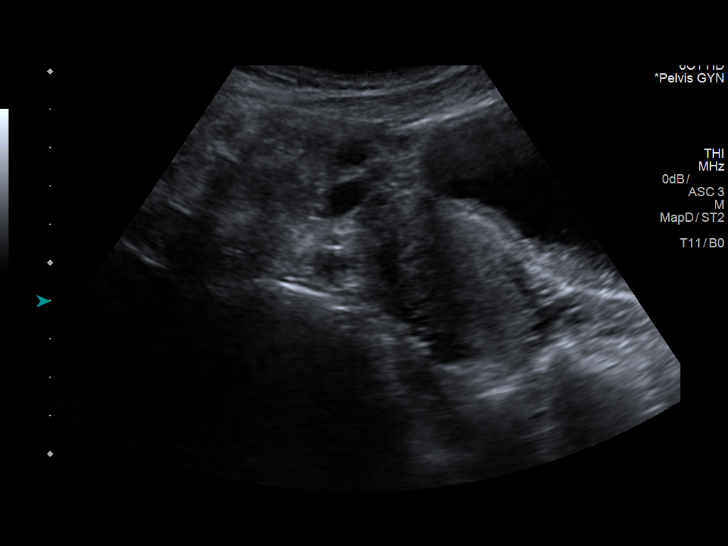
[im 27/107]
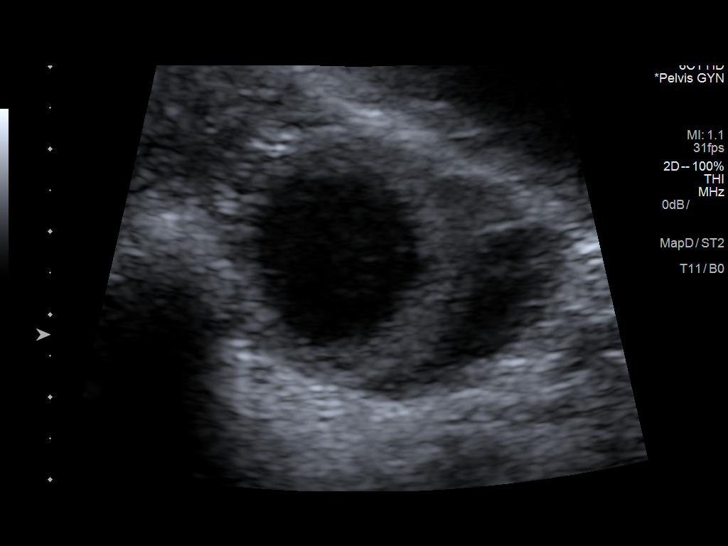
[im 36/107]
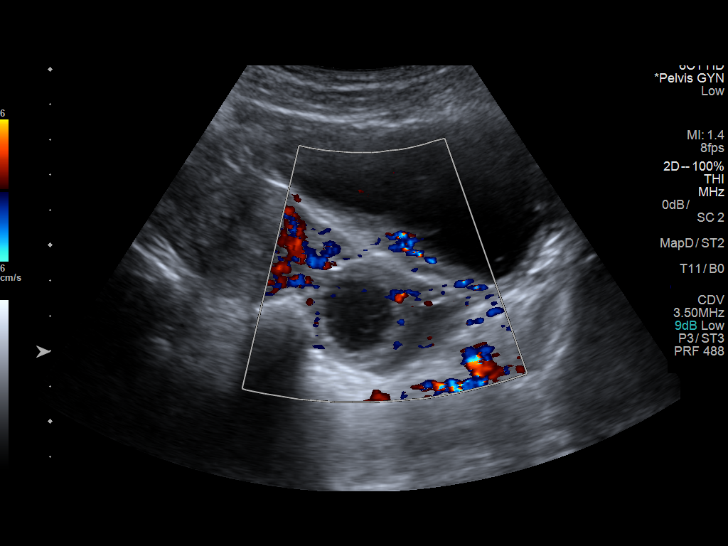
[im 45/107]
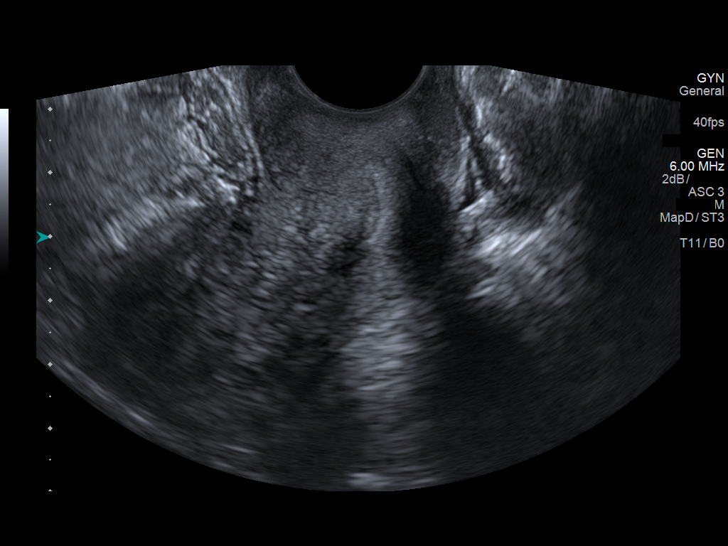
[im 54/107]
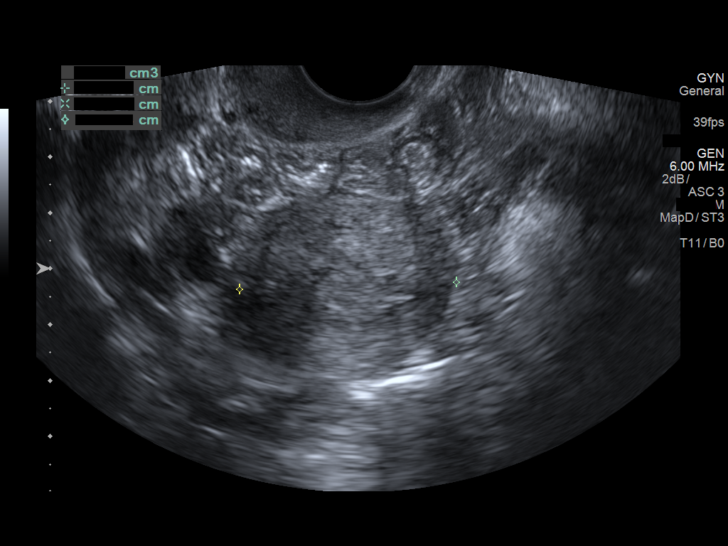
[im 62/107]
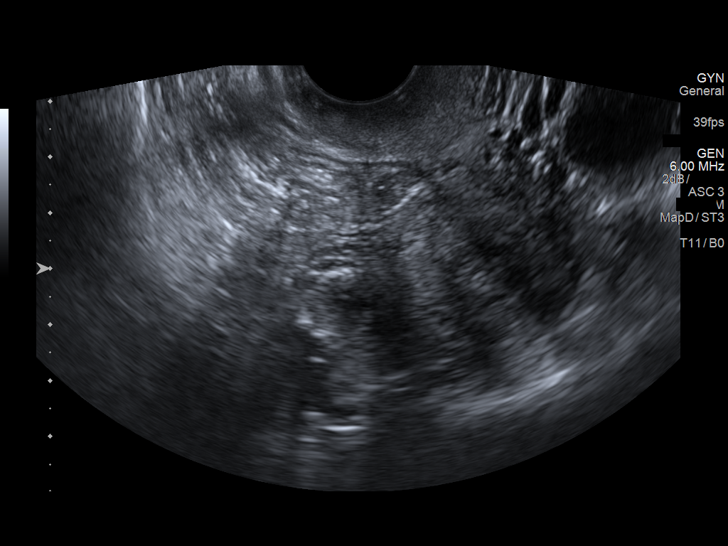
[im 71/107]
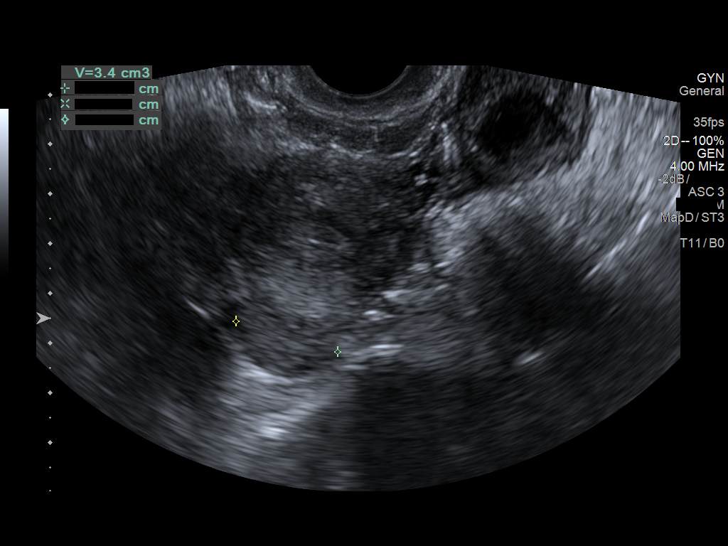
[im 80/107]
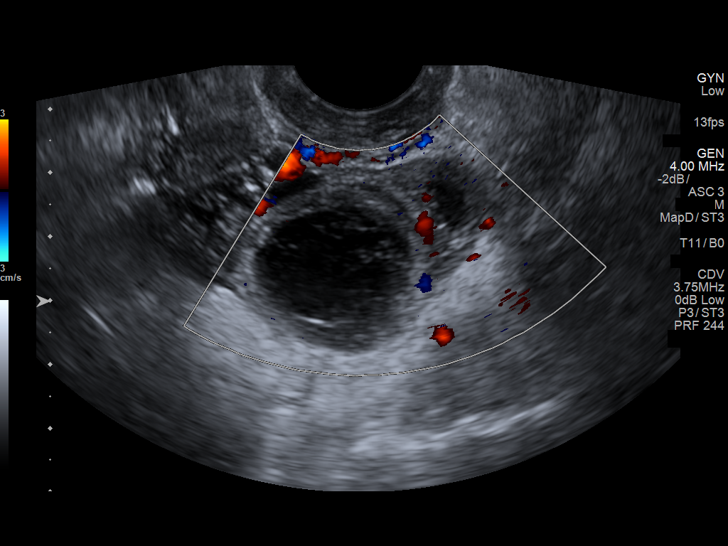
[im 89/107]
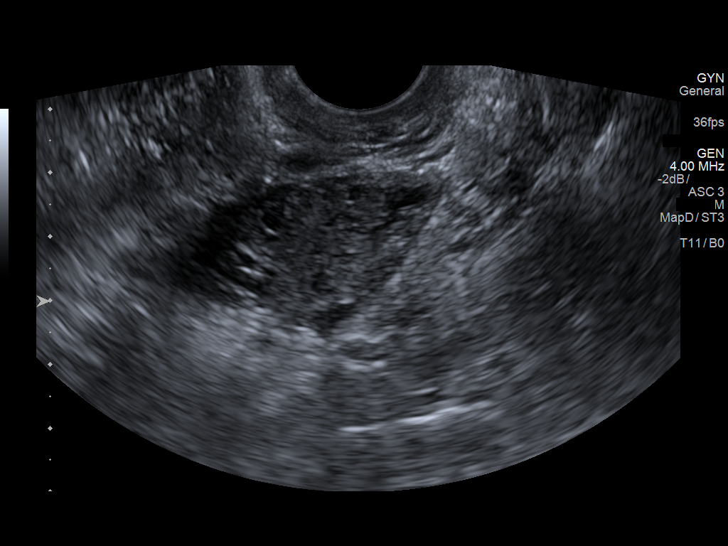
[im 98/107]
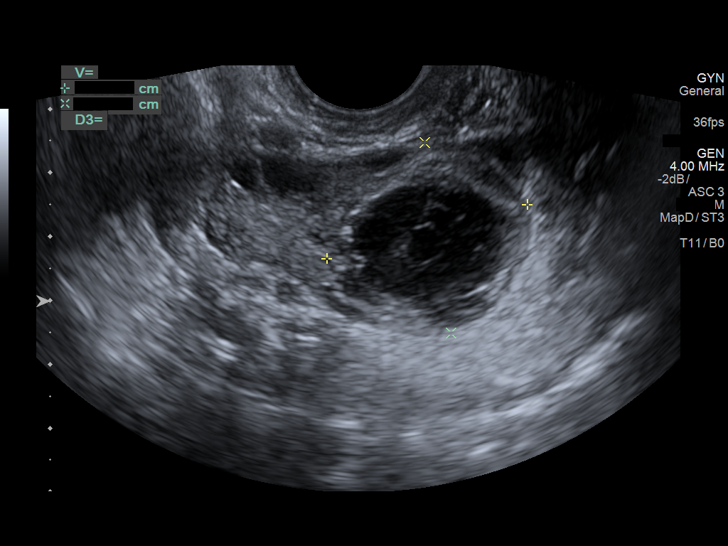
[im 107/107]
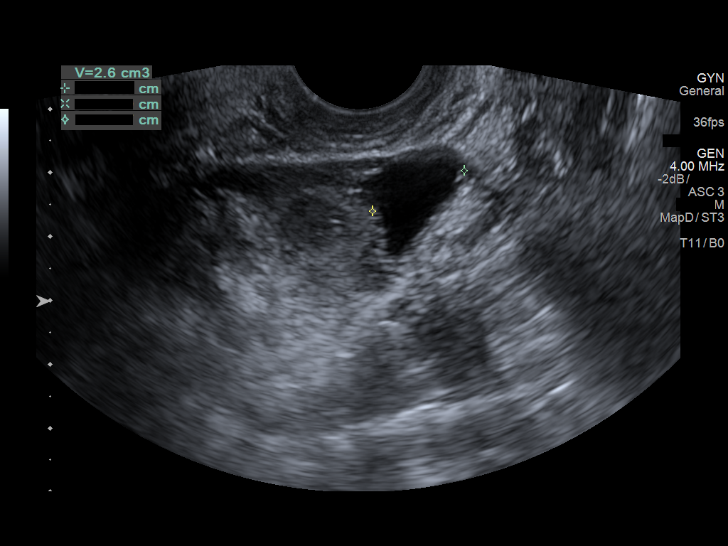

[13 of 25 positions shown; findings below may reference images not displayed]

FINDINGS: Uterus

Measurements: 7.1 x 3.2 x 3.9 cm = volume: 47 mL. No fibroids or
other mass visualized.

Endometrium

Thickness: 12 mm.  No focal abnormality visualized.

Right ovary

Measurements: 2.4 x 1.3 x 2.1 cm = volume: 3.4 mL. Normal
appearance/no adnexal mass.

Left ovary

Measurements: 4.9 x 3.2 x 4.7 cm = volume: 39 mL. The ovaries
overall smaller than on the recent prior CT. A simple appearing cyst
measures 2.4 x 1.3 x 1.6 cm. A 3.3 x 3.0 x 3.2 cm complex cystic
lesion contains fine reticular/lace-like internal echoes.

Other findings

Trace pelvic free fluid.
IMPRESSION: 1. Overall decreased size of the left ovary compared to the recent
prior CT containing a small simple appearing cyst as well as a
larger, 3.3 cm mildly complex lesion most compatible with a
hemorrhagic cyst.
2. Unremarkable appearance of the uterus and right ovary.

## 2020-02-09 NOTE — Discharge Instructions (Addendum)
You have been diagnosed today with exposure to COVID-19 virus.  At this time there does not appear to be the presence of an emergent medical condition, however there is always the potential for conditions to change. Please read and follow the below instructions.  Please return to the Emergency Department immediately for any new or worsening symptoms. Please be sure to follow up with your Primary Care Provider within one week regarding your visit today; please call their office to schedule an appointment even if you are feeling better for a follow-up visit. You have been tested for COVID-19 today.  Your test results will be available on your MyChart account in 1-2 days.  Please continue to quarantine until your results available.  Please discuss those results with your primary care provider at your follow-up visit this week.  Please read the attached COVID-19 handout. Additionally your blood pressure was elevated today.  Please have your blood pressure rechecked by your primary care provider within 1 week and discuss medication management if indicated at that time.  Please take all your medications as prescribed by your primary care provider.  Get help right away if you: Get a very bad headache. Start to feel mixed up (confused). Feel weak or numb. Feel faint. Have very bad pain in your: Chest. Belly (abdomen). Throw up more than once. Have trouble breathing. You have any new/concerning or worsening of symptoms  Please read the additional information packets attached to your discharge summary.  Do not take your medicine if  develop an itchy rash, swelling in your mouth or lips, or difficulty breathing; call 911 and seek immediate emergency medical attention if this occurs.  Note: Portions of this text may have been transcribed using voice recognition software. Every effort was made to ensure accuracy; however, inadvertent computerized transcription errors may still be present.
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
## 2020-02-16 ENCOUNTER — Emergency Department (HOSPITAL_COMMUNITY): Payer: Medicaid Other

## 2020-02-16 ENCOUNTER — Encounter (HOSPITAL_COMMUNITY): Payer: Self-pay

## 2020-02-16 ENCOUNTER — Emergency Department (HOSPITAL_COMMUNITY)
Admission: EM | Admit: 2020-02-16 | Discharge: 2020-02-17 | Disposition: A | Discharge status: Home / Self Care | Payer: Medicaid Other | Attending: Emergency Medicine | Admitting: Emergency Medicine

## 2020-02-16 ENCOUNTER — Other Ambulatory Visit: Payer: Self-pay

## 2020-02-16 DIAGNOSIS — R0602 Shortness of breath: Secondary | ICD-10-CM | POA: Diagnosis not present

## 2020-02-16 DIAGNOSIS — E1022 Type 1 diabetes mellitus with diabetic chronic kidney disease: Secondary | ICD-10-CM | POA: Diagnosis not present

## 2020-02-16 DIAGNOSIS — N183 Chronic kidney disease, stage 3 unspecified: Secondary | ICD-10-CM | POA: Insufficient documentation

## 2020-02-16 DIAGNOSIS — R112 Nausea with vomiting, unspecified: Secondary | ICD-10-CM

## 2020-02-16 DIAGNOSIS — Z79899 Other long term (current) drug therapy: Secondary | ICD-10-CM | POA: Insufficient documentation

## 2020-02-16 DIAGNOSIS — R0789 Other chest pain: Secondary | ICD-10-CM | POA: Diagnosis not present

## 2020-02-16 DIAGNOSIS — I129 Hypertensive chronic kidney disease with stage 1 through stage 4 chronic kidney disease, or unspecified chronic kidney disease: Secondary | ICD-10-CM | POA: Insufficient documentation

## 2020-02-16 DIAGNOSIS — I10 Essential (primary) hypertension: Secondary | ICD-10-CM | POA: Diagnosis not present

## 2020-02-16 DIAGNOSIS — E1165 Type 2 diabetes mellitus with hyperglycemia: Secondary | ICD-10-CM | POA: Diagnosis not present

## 2020-02-16 DIAGNOSIS — Z87891 Personal history of nicotine dependence: Secondary | ICD-10-CM | POA: Insufficient documentation

## 2020-02-16 DIAGNOSIS — N200 Calculus of kidney: Secondary | ICD-10-CM | POA: Diagnosis not present

## 2020-02-16 DIAGNOSIS — E039 Hypothyroidism, unspecified: Secondary | ICD-10-CM | POA: Insufficient documentation

## 2020-02-16 DIAGNOSIS — R079 Chest pain, unspecified: Secondary | ICD-10-CM | POA: Diagnosis not present

## 2020-02-16 DIAGNOSIS — R1013 Epigastric pain: Secondary | ICD-10-CM | POA: Diagnosis not present

## 2020-02-16 LAB — COMPREHENSIVE METABOLIC PANEL
ALT: 13 U/L (ref 0–44)
AST: 19 U/L (ref 15–41)
Albumin: 3.7 g/dL (ref 3.5–5.0)
Alkaline Phosphatase: 76 U/L (ref 38–126)
Anion gap: 7 (ref 5–15)
BUN: 23 mg/dL — ABNORMAL HIGH (ref 6–20)
CO2: 23 mmol/L (ref 22–32)
Calcium: 9.4 mg/dL (ref 8.9–10.3)
Chloride: 105 mmol/L (ref 98–111)
Creatinine, Ser: 3.81 mg/dL — ABNORMAL HIGH (ref 0.44–1.00)
GFR calc Af Amer: 17 mL/min — ABNORMAL LOW (ref >60)
GFR calc non Af Amer: 15 mL/min — ABNORMAL LOW (ref >60)
Glucose, Bld: 208 mg/dL — ABNORMAL HIGH (ref 70–99)
Potassium: 4.9 mmol/L (ref 3.5–5.1)
Sodium: 135 mmol/L (ref 135–145)
Total Bilirubin: 0.5 mg/dL (ref 0.3–1.2)
Total Protein: 6.4 g/dL — ABNORMAL LOW (ref 6.5–8.1)

## 2020-02-16 LAB — URINALYSIS, ROUTINE W REFLEX MICROSCOPIC
Bacteria, UA: NONE SEEN
Bilirubin Urine: NEGATIVE
Glucose, UA: 150 mg/dL — AB
Hgb urine dipstick: NEGATIVE
Ketones, ur: NEGATIVE mg/dL
Leukocytes,Ua: NEGATIVE
Nitrite: NEGATIVE
Protein, ur: >=300 mg/dL — AB
Specific Gravity, Urine: 1.009 (ref 1.005–1.030)
pH: 6 (ref 5.0–8.0)

## 2020-02-16 LAB — I-STAT BETA HCG BLOOD, ED (MC, WL, AP ONLY): I-stat hCG, quantitative: <5 m[IU]/mL (ref <5)

## 2020-02-16 LAB — CBC WITH DIFFERENTIAL/PLATELET
Abs Immature Granulocytes: 0.02 10*3/uL (ref 0.00–0.07)
Basophils Absolute: 0 10*3/uL (ref 0.0–0.1)
Basophils Relative: 0 %
Eosinophils Absolute: 0.1 10*3/uL (ref 0.0–0.5)
Eosinophils Relative: 2 %
HCT: 32.9 % — ABNORMAL LOW (ref 36.0–46.0)
Hemoglobin: 9.7 g/dL — ABNORMAL LOW (ref 12.0–15.0)
Immature Granulocytes: 0 %
Lymphocytes Relative: 29 %
Lymphs Abs: 1.4 10*3/uL (ref 0.7–4.0)
MCH: 28.4 pg (ref 26.0–34.0)
MCHC: 29.5 g/dL — ABNORMAL LOW (ref 30.0–36.0)
MCV: 96.2 fL (ref 80.0–100.0)
Monocytes Absolute: 0.6 10*3/uL (ref 0.1–1.0)
Monocytes Relative: 12 %
Neutro Abs: 2.7 10*3/uL (ref 1.7–7.7)
Neutrophils Relative %: 57 %
Platelets: 171 10*3/uL (ref 150–400)
RBC: 3.42 MIL/uL — ABNORMAL LOW (ref 3.87–5.11)
RDW: 13.8 % (ref 11.5–15.5)
WBC: 4.7 10*3/uL (ref 4.0–10.5)
nRBC: 0 % (ref 0.0–0.2)

## 2020-02-16 LAB — LIPASE, BLOOD: Lipase: 30 U/L (ref 11–51)

## 2020-02-16 LAB — CBG MONITORING, ED: Glucose-Capillary: 171 mg/dL — ABNORMAL HIGH (ref 70–99)

## 2020-02-16 MED ORDER — ONDANSETRON HCL 4 MG/2ML IJ SOLN
4.0000 mg | Freq: Once | INTRAMUSCULAR | Status: AC
  Administered 2020-02-16: 4 mg via INTRAVENOUS
  Filled 2020-02-16: qty 2

## 2020-02-16 MED ORDER — SODIUM CHLORIDE 0.9 % IV BOLUS
1000.0000 mL | Freq: Once | INTRAVENOUS | Status: AC
  Administered 2020-02-16: 22:00:00 1000 mL via INTRAVENOUS

## 2020-02-16 MED ORDER — IOHEXOL 9 MG/ML PO SOLN
ORAL | Status: AC
  Administered 2020-02-16: 23:00:00 500 mL via ORAL
  Filled 2020-02-16: qty 1000

## 2020-02-16 MED ORDER — LIDOCAINE VISCOUS HCL 2 % MT SOLN
15.0000 mL | Freq: Once | OROMUCOSAL | Status: AC
  Administered 2020-02-17: 01:00:00 15 mL via ORAL
  Filled 2020-02-16: qty 15

## 2020-02-16 MED ORDER — IOHEXOL 9 MG/ML PO SOLN
500.0000 mL | ORAL | Status: AC
  Administered 2020-02-16: 23:00:00 500 mL via ORAL

## 2020-02-16 MED ORDER — ALUM & MAG HYDROXIDE-SIMETH 200-200-20 MG/5ML PO SUSP
30.0000 mL | Freq: Once | ORAL | Status: AC
  Administered 2020-02-17: 01:00:00 30 mL via ORAL
  Filled 2020-02-16: qty 30

## 2020-02-16 NOTE — ED Triage Notes (Signed)
Upper abdominal pain for 2 days/. Worse tonight and called EMS. Takes htn medications normally but did not take rx today.   VS pta  bp 160/110 Hr 88 spo2 100 18rr
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
## 2020-02-16 NOTE — ED Provider Notes (Signed)
H/O DM, on pump, HTN 2 days of upper AP, N, V No fever, ?SoB Baseline Cr 3.... Labs reassuring No DKA  Pending NonCM (PO) abd/pel CT, r/o pancreatitis Possible sxs gastroparesis  Plan: review CT, symptom control, PO challenge  CT results negative for pancreatitis. There is questionable changes c/w UTI, however, UA is normal and does not correlate.  The patient is improved. No vomiting with PO challenge. Nausea improved. She is felt appropriate for discharge home and PCP follow up.    Charlann Lange, PA-C 02/17/20 0247    Wyvonnia Dusky, MD 02/17/20 [phone]

## 2020-02-17 ENCOUNTER — Emergency Department (HOSPITAL_COMMUNITY): Payer: Medicaid Other

## 2020-02-17 DIAGNOSIS — N200 Calculus of kidney: Secondary | ICD-10-CM | POA: Diagnosis not present

## 2020-02-17 MED ORDER — ONDANSETRON 4 MG PO TBDP: 4.0000 mg | ORAL_TABLET | Freq: Three times a day (TID) | ORAL | 0 refills | Status: DC | PRN

## 2020-02-17 NOTE — Discharge Instructions (Signed)
Take Zofran at home for nausea.   Please follow up with your doctor for recheck in 2-3 days. And return to the ED with any worsening symptoms or new concerns.

## 2020-02-17 NOTE — ED Notes (Signed)
Pt able to tolerate PO fluids.  

## 2020-02-18 DIAGNOSIS — Z79899 Other long term (current) drug therapy: Secondary | ICD-10-CM | POA: Diagnosis not present

## 2020-02-18 DIAGNOSIS — I129 Hypertensive chronic kidney disease with stage 1 through stage 4 chronic kidney disease, or unspecified chronic kidney disease: Secondary | ICD-10-CM | POA: Diagnosis not present

## 2020-02-18 DIAGNOSIS — Z794 Long term (current) use of insulin: Secondary | ICD-10-CM | POA: Diagnosis not present

## 2020-02-18 DIAGNOSIS — E1022 Type 1 diabetes mellitus with diabetic chronic kidney disease: Secondary | ICD-10-CM | POA: Diagnosis not present

## 2020-02-18 DIAGNOSIS — D509 Iron deficiency anemia, unspecified: Secondary | ICD-10-CM | POA: Diagnosis not present

## 2020-02-18 DIAGNOSIS — K3184 Gastroparesis: Secondary | ICD-10-CM | POA: Diagnosis not present

## 2020-02-18 DIAGNOSIS — E1021 Type 1 diabetes mellitus with diabetic nephropathy: Secondary | ICD-10-CM | POA: Diagnosis not present

## 2020-02-18 DIAGNOSIS — M899 Disorder of bone, unspecified: Secondary | ICD-10-CM | POA: Diagnosis not present

## 2020-02-18 DIAGNOSIS — E039 Hypothyroidism, unspecified: Secondary | ICD-10-CM | POA: Diagnosis not present

## 2020-02-18 DIAGNOSIS — E1043 Type 1 diabetes mellitus with diabetic autonomic (poly)neuropathy: Secondary | ICD-10-CM | POA: Diagnosis not present

## 2020-02-18 DIAGNOSIS — E1010 Type 1 diabetes mellitus with ketoacidosis without coma: Secondary | ICD-10-CM | POA: Diagnosis not present

## 2020-02-18 DIAGNOSIS — N179 Acute kidney failure, unspecified: Secondary | ICD-10-CM | POA: Diagnosis not present

## 2020-02-18 DIAGNOSIS — D631 Anemia in chronic kidney disease: Secondary | ICD-10-CM | POA: Diagnosis not present

## 2020-02-18 DIAGNOSIS — N184 Chronic kidney disease, stage 4 (severe): Secondary | ICD-10-CM | POA: Diagnosis not present

## 2020-02-19 ENCOUNTER — Ambulatory Visit (INDEPENDENT_AMBULATORY_CARE_PROVIDER_SITE_OTHER): Payer: Medicaid Other | Admitting: Psychiatry

## 2020-02-19 ENCOUNTER — Other Ambulatory Visit: Payer: Self-pay

## 2020-02-19 ENCOUNTER — Encounter: Payer: Self-pay | Admitting: Psychiatry

## 2020-02-19 DIAGNOSIS — F418 Other specified anxiety disorders: Secondary | ICD-10-CM | POA: Diagnosis not present

## 2020-02-19 DIAGNOSIS — F3342 Major depressive disorder, recurrent, in full remission: Secondary | ICD-10-CM | POA: Diagnosis not present

## 2020-02-19 DIAGNOSIS — F331 Major depressive disorder, recurrent, moderate: Secondary | ICD-10-CM

## 2020-02-19 DIAGNOSIS — F1210 Cannabis abuse, uncomplicated: Secondary | ICD-10-CM

## 2020-02-19 MED ORDER — BUSPIRONE HCL 15 MG PO TABS: 15.0000 mg | ORAL_TABLET | Freq: Two times a day (BID) | ORAL | 1 refills | Status: DC

## 2020-02-19 MED ORDER — ESCITALOPRAM OXALATE 20 MG PO TABS: 20.0000 mg | ORAL_TABLET | Freq: Every day | ORAL | 1 refills | Status: DC
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
## 2020-02-21 ENCOUNTER — Other Ambulatory Visit: Payer: Self-pay

## 2020-02-21 ENCOUNTER — Encounter: Payer: Self-pay | Admitting: Family

## 2020-02-21 ENCOUNTER — Ambulatory Visit: Payer: Medicaid Other | Attending: Family | Admitting: Family

## 2020-02-21 ENCOUNTER — Encounter: Payer: Self-pay | Admitting: Internal Medicine

## 2020-02-21 ENCOUNTER — Encounter (HOSPITAL_COMMUNITY): Payer: Self-pay | Admitting: *Deleted

## 2020-02-21 ENCOUNTER — Emergency Department (HOSPITAL_COMMUNITY): Payer: Medicaid Other

## 2020-02-21 ENCOUNTER — Emergency Department (HOSPITAL_COMMUNITY)
Admission: EM | Admit: 2020-02-21 | Discharge: 2020-02-21 | Disposition: A | Discharge status: Home / Self Care | Payer: Medicaid Other | Attending: Emergency Medicine | Admitting: Emergency Medicine

## 2020-02-21 VITALS — BP 209/123 | HR 93 | Temp 98.2°F | Resp 16 | Wt 137.6 lb

## 2020-02-21 DIAGNOSIS — R0789 Other chest pain: Secondary | ICD-10-CM | POA: Insufficient documentation

## 2020-02-21 DIAGNOSIS — F419 Anxiety disorder, unspecified: Secondary | ICD-10-CM | POA: Insufficient documentation

## 2020-02-21 DIAGNOSIS — Z79899 Other long term (current) drug therapy: Secondary | ICD-10-CM | POA: Insufficient documentation

## 2020-02-21 DIAGNOSIS — K219 Gastro-esophageal reflux disease without esophagitis: Secondary | ICD-10-CM | POA: Diagnosis not present

## 2020-02-21 DIAGNOSIS — Z87891 Personal history of nicotine dependence: Secondary | ICD-10-CM | POA: Insufficient documentation

## 2020-02-21 DIAGNOSIS — N1831 Chronic kidney disease, stage 3a: Secondary | ICD-10-CM | POA: Insufficient documentation

## 2020-02-21 DIAGNOSIS — E1022 Type 1 diabetes mellitus with diabetic chronic kidney disease: Secondary | ICD-10-CM | POA: Diagnosis not present

## 2020-02-21 DIAGNOSIS — E10319 Type 1 diabetes mellitus with unspecified diabetic retinopathy without macular edema: Secondary | ICD-10-CM | POA: Diagnosis not present

## 2020-02-21 DIAGNOSIS — Z833 Family history of diabetes mellitus: Secondary | ICD-10-CM | POA: Diagnosis not present

## 2020-02-21 DIAGNOSIS — E785 Hyperlipidemia, unspecified: Secondary | ICD-10-CM | POA: Diagnosis not present

## 2020-02-21 DIAGNOSIS — Z8249 Family history of ischemic heart disease and other diseases of the circulatory system: Secondary | ICD-10-CM | POA: Insufficient documentation

## 2020-02-21 DIAGNOSIS — R079 Chest pain, unspecified: Secondary | ICD-10-CM | POA: Diagnosis not present

## 2020-02-21 DIAGNOSIS — Z8349 Family history of other endocrine, nutritional and metabolic diseases: Secondary | ICD-10-CM | POA: Insufficient documentation

## 2020-02-21 DIAGNOSIS — E1043 Type 1 diabetes mellitus with diabetic autonomic (poly)neuropathy: Secondary | ICD-10-CM | POA: Insufficient documentation

## 2020-02-21 DIAGNOSIS — R609 Edema, unspecified: Secondary | ICD-10-CM | POA: Diagnosis not present

## 2020-02-21 DIAGNOSIS — Z794 Long term (current) use of insulin: Secondary | ICD-10-CM | POA: Insufficient documentation

## 2020-02-21 DIAGNOSIS — Z560 Unemployment, unspecified: Secondary | ICD-10-CM | POA: Diagnosis not present

## 2020-02-21 DIAGNOSIS — E039 Hypothyroidism, unspecified: Secondary | ICD-10-CM | POA: Insufficient documentation

## 2020-02-21 DIAGNOSIS — K3184 Gastroparesis: Secondary | ICD-10-CM | POA: Insufficient documentation

## 2020-02-21 DIAGNOSIS — D509 Iron deficiency anemia, unspecified: Secondary | ICD-10-CM | POA: Diagnosis not present

## 2020-02-21 DIAGNOSIS — N183 Chronic kidney disease, stage 3 unspecified: Secondary | ICD-10-CM | POA: Diagnosis not present

## 2020-02-21 DIAGNOSIS — I129 Hypertensive chronic kidney disease with stage 1 through stage 4 chronic kidney disease, or unspecified chronic kidney disease: Secondary | ICD-10-CM | POA: Diagnosis not present

## 2020-02-21 DIAGNOSIS — N2581 Secondary hyperparathyroidism of renal origin: Secondary | ICD-10-CM | POA: Diagnosis not present

## 2020-02-21 DIAGNOSIS — I10 Essential (primary) hypertension: Secondary | ICD-10-CM | POA: Diagnosis not present

## 2020-02-21 DIAGNOSIS — F3342 Major depressive disorder, recurrent, in full remission: Secondary | ICD-10-CM | POA: Insufficient documentation

## 2020-02-21 LAB — URINALYSIS, ROUTINE W REFLEX MICROSCOPIC
Bacteria, UA: NONE SEEN
Bilirubin Urine: NEGATIVE
Glucose, UA: 50 mg/dL — AB
Ketones, ur: NEGATIVE mg/dL
Leukocytes,Ua: NEGATIVE
Nitrite: NEGATIVE
Protein, ur: >=300 mg/dL — AB
Specific Gravity, Urine: 1.004 — ABNORMAL LOW (ref 1.005–1.030)
pH: 7 (ref 5.0–8.0)

## 2020-02-21 LAB — CBC
HCT: 37.1 % (ref 36.0–46.0)
Hemoglobin: 10.8 g/dL — ABNORMAL LOW (ref 12.0–15.0)
MCH: 28.1 pg (ref 26.0–34.0)
MCHC: 29.1 g/dL — ABNORMAL LOW (ref 30.0–36.0)
MCV: 96.6 fL (ref 80.0–100.0)
Platelets: 142 10*3/uL — ABNORMAL LOW (ref 150–400)
RBC: 3.84 MIL/uL — ABNORMAL LOW (ref 3.87–5.11)
RDW: 13.9 % (ref 11.5–15.5)
WBC: 5.7 10*3/uL (ref 4.0–10.5)
nRBC: 0 % (ref 0.0–0.2)

## 2020-02-21 LAB — BASIC METABOLIC PANEL
Anion gap: 12 (ref 5–15)
BUN: 25 mg/dL — ABNORMAL HIGH (ref 6–20)
CO2: 22 mmol/L (ref 22–32)
Calcium: 9.5 mg/dL (ref 8.9–10.3)
Chloride: 102 mmol/L (ref 98–111)
Creatinine, Ser: 3.8 mg/dL — ABNORMAL HIGH (ref 0.44–1.00)
GFR calc Af Amer: 17 mL/min — ABNORMAL LOW (ref >60)
GFR calc non Af Amer: 15 mL/min — ABNORMAL LOW (ref >60)
Glucose, Bld: 267 mg/dL — ABNORMAL HIGH (ref 70–99)
Potassium: 4.2 mmol/L (ref 3.5–5.1)
Sodium: 136 mmol/L (ref 135–145)

## 2020-02-21 LAB — TROPONIN I (HIGH SENSITIVITY)
Troponin I (High Sensitivity): 9 ng/L (ref <18)
Troponin I (High Sensitivity): 8 ng/L (ref <18)

## 2020-02-21 LAB — I-STAT BETA HCG BLOOD, ED (MC, WL, AP ONLY): I-stat hCG, quantitative: <5 m[IU]/mL (ref <5)

## 2020-02-21 LAB — CBG MONITORING, ED: Glucose-Capillary: 101 mg/dL — ABNORMAL HIGH (ref 70–99)

## 2020-02-21 MED ORDER — HYDROCODONE-ACETAMINOPHEN 5-325 MG PO TABS
1.0000 | ORAL_TABLET | Freq: Once | ORAL | Status: AC
  Administered 2020-02-21: 1 via ORAL
  Filled 2020-02-21: qty 1

## 2020-02-21 MED ORDER — HYDRALAZINE HCL 25 MG PO TABS
75.0000 mg | ORAL_TABLET | Freq: Once | ORAL | Status: AC
  Administered 2020-02-21: 19:00:00 75 mg via ORAL
  Filled 2020-02-21: qty 3

## 2020-02-21 MED ORDER — SODIUM CHLORIDE 0.9% FLUSH: 3.0000 mL | Freq: Once | INTRAVENOUS | Status: DC
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
## 2020-02-21 NOTE — ED Notes (Signed)
Pt verbalized understanding of d/c instructions, follow up care and s/s requiring return to ED. Pt had no questions at this time. Pt refused wheelchair and ambulated out unassisted.

## 2020-02-21 NOTE — ED Triage Notes (Signed)
Patient presents to ed via [facility] states she went [facility] today with c/o chest pain describes as tightness and pressure, states the pain radiates into her neck and back.

## 2020-02-25 ENCOUNTER — Other Ambulatory Visit: Payer: Self-pay

## 2020-02-25 ENCOUNTER — Emergency Department (HOSPITAL_COMMUNITY)
Admission: EM | Admit: 2020-02-25 | Discharge: 2020-02-25 | Disposition: A | Discharge status: Home / Self Care | Payer: Medicaid Other | Attending: Emergency Medicine | Admitting: Emergency Medicine

## 2020-02-25 ENCOUNTER — Encounter (HOSPITAL_COMMUNITY): Payer: Self-pay

## 2020-02-25 DIAGNOSIS — I129 Hypertensive chronic kidney disease with stage 1 through stage 4 chronic kidney disease, or unspecified chronic kidney disease: Secondary | ICD-10-CM | POA: Insufficient documentation

## 2020-02-25 DIAGNOSIS — N1831 Chronic kidney disease, stage 3a: Secondary | ICD-10-CM | POA: Insufficient documentation

## 2020-02-25 DIAGNOSIS — E162 Hypoglycemia, unspecified: Secondary | ICD-10-CM | POA: Diagnosis not present

## 2020-02-25 DIAGNOSIS — G4489 Other headache syndrome: Secondary | ICD-10-CM | POA: Diagnosis not present

## 2020-02-25 DIAGNOSIS — E1022 Type 1 diabetes mellitus with diabetic chronic kidney disease: Secondary | ICD-10-CM | POA: Insufficient documentation

## 2020-02-25 DIAGNOSIS — E10649 Type 1 diabetes mellitus with hypoglycemia without coma: Secondary | ICD-10-CM | POA: Diagnosis not present

## 2020-02-25 DIAGNOSIS — R112 Nausea with vomiting, unspecified: Secondary | ICD-10-CM | POA: Diagnosis not present

## 2020-02-25 DIAGNOSIS — R519 Headache, unspecified: Secondary | ICD-10-CM | POA: Insufficient documentation

## 2020-02-25 DIAGNOSIS — I10 Essential (primary) hypertension: Secondary | ICD-10-CM | POA: Diagnosis not present

## 2020-02-25 DIAGNOSIS — R404 Transient alteration of awareness: Secondary | ICD-10-CM | POA: Diagnosis not present

## 2020-02-25 DIAGNOSIS — E161 Other hypoglycemia: Secondary | ICD-10-CM | POA: Diagnosis not present

## 2020-02-25 DIAGNOSIS — Z794 Long term (current) use of insulin: Secondary | ICD-10-CM | POA: Insufficient documentation

## 2020-02-25 LAB — CBC WITH DIFFERENTIAL/PLATELET
Abs Immature Granulocytes: 0.03 10*3/uL (ref 0.00–0.07)
Basophils Absolute: 0 10*3/uL (ref 0.0–0.1)
Basophils Relative: 0 %
Eosinophils Absolute: 0.1 10*3/uL (ref 0.0–0.5)
Eosinophils Relative: 2 %
HCT: 36 % (ref 36.0–46.0)
Hemoglobin: 10.8 g/dL — ABNORMAL LOW (ref 12.0–15.0)
Immature Granulocytes: 1 %
Lymphocytes Relative: 21 %
Lymphs Abs: 1.3 10*3/uL (ref 0.7–4.0)
MCH: 28.9 pg (ref 26.0–34.0)
MCHC: 30 g/dL (ref 30.0–36.0)
MCV: 96.3 fL (ref 80.0–100.0)
Monocytes Absolute: 0.5 10*3/uL (ref 0.1–1.0)
Monocytes Relative: 8 %
Neutro Abs: 4.3 10*3/uL (ref 1.7–7.7)
Neutrophils Relative %: 68 %
Platelets: 134 10*3/uL — ABNORMAL LOW (ref 150–400)
RBC: 3.74 MIL/uL — ABNORMAL LOW (ref 3.87–5.11)
RDW: 14.2 % (ref 11.5–15.5)
WBC: 6.2 10*3/uL (ref 4.0–10.5)
nRBC: 0 % (ref 0.0–0.2)

## 2020-02-25 LAB — BASIC METABOLIC PANEL
Anion gap: 10 (ref 5–15)
BUN: 31 mg/dL — ABNORMAL HIGH (ref 6–20)
CO2: 23 mmol/L (ref 22–32)
Calcium: 9.5 mg/dL (ref 8.9–10.3)
Chloride: 102 mmol/L (ref 98–111)
Creatinine, Ser: 4.53 mg/dL — ABNORMAL HIGH (ref 0.44–1.00)
GFR calc Af Amer: 14 mL/min — ABNORMAL LOW (ref >60)
GFR calc non Af Amer: 12 mL/min — ABNORMAL LOW (ref >60)
Glucose, Bld: 196 mg/dL — ABNORMAL HIGH (ref 70–99)
Potassium: 4.3 mmol/L (ref 3.5–5.1)
Sodium: 135 mmol/L (ref 135–145)

## 2020-02-25 LAB — CBG MONITORING, ED
Glucose-Capillary: 197 mg/dL — ABNORMAL HIGH (ref 70–99)
Glucose-Capillary: 245 mg/dL — ABNORMAL HIGH (ref 70–99)

## 2020-02-25 MED ORDER — DIPHENHYDRAMINE HCL 50 MG/ML IJ SOLN
25.0000 mg | Freq: Once | INTRAMUSCULAR | Status: AC
  Administered 2020-02-25: 25 mg via INTRAVENOUS
  Filled 2020-02-25: qty 1

## 2020-02-25 MED ORDER — PROCHLORPERAZINE EDISYLATE 10 MG/2ML IJ SOLN
10.0000 mg | Freq: Once | INTRAMUSCULAR | Status: AC
  Administered 2020-02-25: 10 mg via INTRAVENOUS
  Filled 2020-02-25: qty 2

## 2020-02-25 MED ORDER — SODIUM CHLORIDE 0.9 % IV BOLUS
1000.0000 mL | Freq: Once | INTRAVENOUS | Status: AC
  Administered 2020-02-25: 1000 mL via INTRAVENOUS

## 2020-02-25 NOTE — Discharge Instructions (Signed)
Follow up with your doctor.  Return for worsening.

## 2020-02-25 NOTE — ED Notes (Signed)
Dr. Tyrone Nine contacted to finish pt AVS to d/c.
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
## 2020-02-25 NOTE — ED Notes (Signed)
Dr. Floyd at bedside. 

## 2020-02-25 NOTE — ED Triage Notes (Signed)
Pt BIB [facility] from home after meter was reading 76 for blood glucose. Pt was able to drink OJ before EMS arrival. EMS obtained a CBG of 75, D10 was administered. After d!0 pt CBG was 243. On arrival pt began vomiting, EMS administered 4mg  of Zofran. Pt now c/o headache. Pt A&Ox4

## 2020-02-26 ENCOUNTER — Ambulatory Visit (HOSPITAL_COMMUNITY): Payer: Medicaid Other | Admitting: Licensed Clinical Social Worker

## 2020-02-28 ENCOUNTER — Other Ambulatory Visit: Payer: Self-pay | Admitting: Internal Medicine

## 2020-02-28 DIAGNOSIS — E1029 Type 1 diabetes mellitus with other diabetic kidney complication: Secondary | ICD-10-CM

## 2020-03-02 MED FILL — NovoLOG 100 UNIT/ML SOLN: 100 | 50 days supply | Qty: 20 | Fill #0

## 2020-03-03 DIAGNOSIS — E039 Hypothyroidism, unspecified: Secondary | ICD-10-CM | POA: Diagnosis not present

## 2020-03-03 DIAGNOSIS — N184 Chronic kidney disease, stage 4 (severe): Secondary | ICD-10-CM | POA: Diagnosis not present

## 2020-03-03 DIAGNOSIS — D631 Anemia in chronic kidney disease: Secondary | ICD-10-CM | POA: Diagnosis not present

## 2020-03-05 DIAGNOSIS — E1022 Type 1 diabetes mellitus with diabetic chronic kidney disease: Secondary | ICD-10-CM | POA: Diagnosis not present

## 2020-03-09 DIAGNOSIS — E109 Type 1 diabetes mellitus without complications: Secondary | ICD-10-CM | POA: Diagnosis not present

## 2020-03-17 DIAGNOSIS — N184 Chronic kidney disease, stage 4 (severe): Secondary | ICD-10-CM | POA: Diagnosis not present

## 2020-03-17 DIAGNOSIS — D631 Anemia in chronic kidney disease: Secondary | ICD-10-CM | POA: Diagnosis not present

## 2020-04-06 ENCOUNTER — Encounter: Payer: Self-pay | Admitting: Internal Medicine

## 2020-04-09 ENCOUNTER — Other Ambulatory Visit: Payer: Self-pay

## 2020-04-09 ENCOUNTER — Encounter: Payer: Self-pay | Admitting: Neurology

## 2020-04-09 ENCOUNTER — Ambulatory Visit: Payer: Medicaid Other | Admitting: Neurology

## 2020-04-09 VITALS — BP 161/97 | HR 89 | Temp 97.2°F | Ht 65.0 in | Wt 138.0 lb

## 2020-04-09 DIAGNOSIS — G43709 Chronic migraine without aura, not intractable, without status migrainosus: Secondary | ICD-10-CM

## 2020-04-09 DIAGNOSIS — R51 Headache with orthostatic component, not elsewhere classified: Secondary | ICD-10-CM | POA: Diagnosis not present

## 2020-04-09 DIAGNOSIS — E109 Type 1 diabetes mellitus without complications: Secondary | ICD-10-CM | POA: Diagnosis not present

## 2020-04-09 DIAGNOSIS — H93A9 Pulsatile tinnitus, unspecified ear: Secondary | ICD-10-CM

## 2020-04-09 DIAGNOSIS — G4484 Primary exertional headache: Secondary | ICD-10-CM

## 2020-04-09 DIAGNOSIS — Z79899 Other long term (current) drug therapy: Secondary | ICD-10-CM | POA: Diagnosis not present

## 2020-04-09 DIAGNOSIS — H539 Unspecified visual disturbance: Secondary | ICD-10-CM

## 2020-04-09 DIAGNOSIS — R519 Headache, unspecified: Secondary | ICD-10-CM

## 2020-04-09 DIAGNOSIS — M5481 Occipital neuralgia: Secondary | ICD-10-CM | POA: Diagnosis not present

## 2020-04-09 MED ORDER — NURTEC 75 MG PO TBDP: 75.0000 mg | ORAL_TABLET | Freq: Every day | ORAL | 0 refills | Status: DC | PRN

## 2020-04-09 MED ORDER — EMGALITY 120 MG/ML ~~LOC~~ SOAJ: 120.0000 mg | SUBCUTANEOUS | 11 refills | Status: DC
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
## 2020-04-09 NOTE — Progress Notes (Signed)
Nerve block w/o steroid: Pt signed consent  0.5% Bupivocaine 3 mL LOT: [id number] EXP: 01/2021 [place]: [id number]  2% Lidocaine 3 mL LOT: [id number] EXP: 06/2021 [place]: [id number]

## 2020-04-09 NOTE — Patient Instructions (Addendum)
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 Pain from occipital neuralgia may spread (radiate) to other parts of your head. These headaches may be caused by irritation of the nerves that leave your spinal cord high up in your neck, just below the base of your skull (occipital nerves). Your occipital nerves transmit sensations from the back of your head, the top of your head, and the areas behind your ears. What are the causes? This condition can occur without any known cause (primary headache syndrome). In other cases, this condition is caused by pressure on or irritation of one of the two occipital nerves. Pressure and irritation may be due to:  Muscle spasm in the neck.  Neck injury.  Wear and tear of the vertebrae in the neck (osteoarthritis).  Disease of the disks that separate the vertebrae.  Swollen blood vessels that put pressure on the occipital nerves.  Infections.  Tumors.  Diabetes. What are the signs or symptoms? This condition causes brief burning, stabbing, electric, shocking, or shooting pain which can radiate to the top of the head. It can happen on one side or both sides of the head. It can also cause:  Pain behind the eye.  Pain triggered by neck movement or hair brushing.  Scalp tenderness.  Aching in the back of the head between episodes of very bad pain.  Pain gets worse with exposure to bright lights. How is this diagnosed? There is no test that diagnoses this condition. Your health care provider may diagnose this condition based on a physical exam and your symptoms. Other tests may be done, such as:  Imaging studies of the brain and neck (cervical spine), such as an MRI or CT scan. These look for  causes of pinched nerves.  Applying pressure to the nerves in the neck to try to re-create the pain.  Injection of numbing medicine into the occipital nerve areas to see if pain goes away (diagnostic nerve block). How is this treated? Treatment for this condition may begin with simple measures, such as:  Rest.  Massage.  Applying heat or cold on the area.  Over-the-counter pain relievers. If these measures do not work, you may need other treatments, including:  Medicines, such as: ? Prescription-strength anti-inflammatory medicines. ? Muscle relaxants. ? Anti-seizure medicines, which can relieve pain. ? Antidepressants, which can relieve pain. ? Injected medicines, such as medicines that numb the area (local anesthetic) and steroids.  Pulsed radiofrequency ablation. This is when wires are implanted to deliver electrical impulses that block pain signals from the occipital nerve.  Surgery to relieve nerve pressure.  Physical therapy. Follow these instructions at home: Pain management      Avoid any activities that cause pain.  Rest when you have an attack of pain.  Try gentle massage to relieve pain.  Try a different pillow or sleeping position.  If directed, apply heat to the affected area as told by your health care provider. Use the heat source that your health care provider recommends, such as a moist heat pack or a heating pad. ? Place a towel between your skin and the heat source. ? Leave the heat on for 20-30 minutes. ? Remove the heat if your skin  turns bright red. This is especially important if you are unable to feel pain, heat, or cold. You may have a greater risk of getting burned.  If directed, apply ice to the back of the head and neck area as told by your health care provider. ? Put ice in a plastic bag. ? Place a towel between your skin and the bag. ? Leave the ice on for 20 minutes, 2-3 times per day. General instructions  Take over-the-counter and  prescription medicines only as told by your health care provider.  Avoid things that make your symptoms worse, such as bright lights.  Try to stay active. Get regular exercise that does not cause pain. Ask your health care provider to suggest safe exercises for you.  Work with a physical therapist to learn stretching exercises you can do at home.  Practice good posture.  Keep all follow-up visits as told by your health care provider. This is important. Contact a health care provider if:  Your medicine is not working.  You have new or worsening symptoms. Get help right away if:  You have very bad head pain that does not go away.  You have a sudden change in vision, balance, or speech. Summary  Occipital neuralgia is a type of headache that causes brief episodes of very bad pain in the back of your head.  Pain from occipital neuralgia may spread (radiate) to other parts of your head.  Treatment for this condition includes rest, massage, and medicines. This information is not intended to replace advice given to you by your health care provider. Make sure you discuss any questions you have with your health care provider. Document Revised: 11/21/2017 Document Reviewed: 02/09/2017 Elsevier Patient Education  [place].  Migraine Headache A migraine headache is a very strong throbbing pain on one side or both sides of your head. This type of headache can also cause other symptoms. It can last from 4 hours to 3 days. Talk with your doctor about what things may bring on (trigger) this condition. What are the causes? The exact cause of this condition is not known. This condition may be triggered or caused by:  Drinking alcohol.  Smoking.  Taking medicines, such as: ? Medicine used to treat chest pain (nitroglycerin). ? Birth control pills. ? Estrogen. ? Some blood pressure medicines.  Eating or drinking certain products.  Doing physical activity. Other things that may  trigger a migraine headache include:  Having a menstrual period.  Pregnancy.  Hunger.  Stress.  Not getting enough sleep or getting too much sleep.  Weather changes.  Tiredness (fatigue). What increases the risk?  Being 34-74 years old.  Being female.  Having a family history of migraine headaches.  Being Caucasian.  Having depression or anxiety.  Being very overweight. What are the signs or symptoms?  A throbbing pain. This pain may: ? Happen in any area of the head, such as on one side or both sides. ? Make it hard to do daily activities. ? Get worse with physical activity. ? Get worse around bright lights or loud noises.  Other symptoms may include: ? Feeling sick to your stomach (nauseous). ? Vomiting. ? Dizziness. ? Being sensitive to bright lights, loud noises, or smells.  Before you get a migraine headache, you may get warning signs (an aura). An aura may include: ? Seeing flashing lights or having blind spots. ? Seeing bright spots, halos, or zigzag lines. ? Having tunnel vision or blurred vision. ?  Having numbness or a tingling feeling. ? Having trouble talking. ? Having weak muscles.  Some people have symptoms after a migraine headache (postdromal phase), such as: ? Tiredness. ? Trouble thinking (concentrating). How is this treated?  Taking medicines that: ? Relieve pain. ? Relieve the feeling of being sick to your stomach. ? Prevent migraine headaches.  Treatment may also include: ? Having acupuncture. ? Avoiding foods that bring on migraine headaches. ? Learning ways to control your body functions (biofeedback). ? Therapy to help you know and deal with negative thoughts (cognitive behavioral therapy). Follow these instructions at home: Medicines  Take over-the-counter and prescription medicines only as told by your doctor.  Ask your doctor if the medicine prescribed to you: ? Requires you to avoid driving or using heavy  machinery. ? Can cause trouble pooping (constipation). You may need to take these steps to prevent or treat trouble pooping:  Drink enough fluid to keep your pee (urine) pale yellow.  Take over-the-counter or prescription medicines.  Eat foods that are high in fiber. These include beans, whole grains, and fresh fruits and vegetables.  Limit foods that are high in fat and sugar. These include fried or sweet foods. Lifestyle  Do not drink alcohol.  Do not use any products that contain nicotine or tobacco, such as cigarettes, e-cigarettes, and chewing tobacco. If you need help quitting, ask your doctor.  Get at least 8 hours of sleep every night.  Limit and deal with stress. General instructions      Keep a journal to find out what may bring on your migraine headaches. For example, write down: ? What you eat and drink. ? How much sleep you get. ? Any change in what you eat or drink. ? Any change in your medicines.  If you have a migraine headache: ? Avoid things that make your symptoms worse, such as bright lights. ? It may help to lie down in a dark, quiet room. ? Do not drive or use heavy machinery. ? Ask your doctor what activities are safe for you.  Keep all follow-up visits as told by your doctor. This is important. Contact a doctor if:  You get a migraine headache that is different or worse than others you have had.  You have more than 15 headache days in one month. Get help right away if:  Your migraine headache gets very bad.  Your migraine headache lasts longer than 72 hours.  You have a fever.  You have a stiff neck.  You have trouble seeing.  Your muscles feel weak or like you cannot control them.  You start to lose your balance a lot.  You start to have trouble walking.  You pass out (faint).  You have a seizure. Summary  A migraine headache is a very strong throbbing pain on one side or both sides of your head. These headaches can also cause  other symptoms.  This condition may be treated with medicines and changes to your lifestyle.  Keep a journal to find out what may bring on your migraine headaches.  Contact a doctor if you get a migraine headache that is different or worse than others you have had.  Contact your doctor if you have more than 15 headache days in a month. This information is not intended to replace advice given to you by your health care provider. Make sure you discuss any questions you have with your health care provider. Document Revised: 03/29/2019 Document Reviewed: 01/17/2019 Elsevier Patient Education  [place] injection What is this medicine? GALCANEZUMAB (gal ka NEZ ue mab) is used to prevent migraines and treat cluster headaches. This medicine may be used for other purposes; ask your health care provider or pharmacist if you have questions. COMMON BRAND NAME(S): Emgality What should I tell my health care provider before I take this medicine? They need to know if you have any of these conditions:  an unusual or allergic reaction to galcanezumab, other medicines, foods, dyes, or preservatives  pregnant or trying to get pregnant  breast-feeding How should I use this medicine? This medicine is for injection under the skin. You will be taught how to prepare and give this medicine. Use exactly as directed. Take your medicine at regular intervals. Do not take your medicine more often than directed. It is important that you put your used needles and syringes in a special sharps container. Do not put them in a trash can. If you do not have a sharps container, call your pharmacist or healthcare provider to get one. Talk to your pediatrician regarding the use of this medicine in children. Special care may be needed. Overdosage: If you think you have taken too much of this medicine contact a poison control center or emergency room at once. NOTE: This medicine is only for you. Do not  share this medicine with others. What if I miss a dose? If you miss a dose, take it as soon as you can. If it is almost time for your next dose, take only that dose. Do not take double or extra doses. What may interact with this medicine? Interactions are not expected. This list may not describe all possible interactions. Give your health care provider a list of all the medicines, herbs, non-prescription drugs, or dietary supplements you use. Also tell them if you smoke, drink alcohol, or use illegal drugs. Some items may interact with your medicine. What should I watch for while using this medicine? Tell your doctor or healthcare professional if your symptoms do not start to get better or if they get worse. What side effects may I notice from receiving this medicine? Side effects that you should report to your doctor or health care professional as soon as possible:  allergic reactions like skin rash, itching or hives, swelling of the face, lips, or tongue Side effects that usually do not require medical attention (report these to your doctor or health care professional if they continue or are bothersome):  pain, redness, or irritation at site where injected This list may not describe all possible side effects. Call your doctor for medical advice about side effects. You may report side effects to FDA at 1-800-FDA-1088. Where should I keep my medicine? Keep out of the reach of children. You will be instructed on how to store this medicine. Throw away any unused medicine after the expiration date on the label. NOTE: This sheet is a summary. It may not cover all possible information. If you have questions about this medicine, talk to your doctor, pharmacist, or health care provider.  2020 Elsevier/Gold Standard (2018-05-23 12:03:23)  Rimegepant oral dissolving tablet What is this medicine? RIMEGEPANT (ri ME je pant) is used to treat migraine headaches with or without aura. An aura is a strange  feeling or visual disturbance that warns you of an attack. It is not used to prevent migraines. This medicine may be used for other purposes; ask your health care provider or pharmacist if you have questions. COMMON BRAND NAME(S): NURTEC ODT  What should I tell my health care provider before I take this medicine? They need to know if you have any of these conditions: kidney disease liver disease an unusual or allergic reaction to rimegepant, other medicines, foods, dyes, or preservatives pregnant or trying to get pregnant breast-feeding How should I use this medicine? Take the medicine by mouth. Follow the directions on the prescription label. Leave the tablet in the sealed blister pack until you are ready to take it. With dry hands, open the blister and gently remove the tablet. If the tablet breaks or crumbles, throw it away and take a new tablet out of the blister pack. Place the tablet in the mouth and allow it to dissolve, and then swallow. Do not cut, crush, or chew this medicine. You do not need water to take this medicine. Talk to your pediatrician about the use of this medicine in children. Special care may be needed. Overdosage: If you think you have taken too much of this medicine contact a poison control center or emergency room at once. NOTE: This medicine is only for you. Do not share this medicine with others. What if I miss a dose? This does not apply. This medicine is not for regular use. What may interact with this medicine? This medicine may interact with the following medications: certain medicines for fungal infections like fluconazole, itraconazole rifampin This list may not describe all possible interactions. Give your health care provider a list of all the medicines, herbs, non-prescription drugs, or dietary supplements you use. Also tell them if you smoke, drink alcohol, or use illegal drugs. Some items may interact with your medicine. What should I watch for while using  this medicine? Visit your health care professional for regular checks on your progress. Tell your health care professional if your symptoms do not start to get better or if they get worse. What side effects may I notice from receiving this medicine? Side effects that you should report to your doctor or health care professional as soon as possible: allergic reactions like skin rash, itching or hives; swelling of the face, lips, or tongue Side effects that usually do not require medical attention (report these to your doctor or health care professional if they continue or are bothersome): nausea This list may not describe all possible side effects. Call your doctor for medical advice about side effects. You may report side effects to FDA at 1-800-FDA-1088. Where should I keep my medicine? Keep out of the reach of children. Store at room temperature between 15 and 30 degrees C (59 and 86 degrees F). Throw away any unused medicine after the expiration date. NOTE: This sheet is a summary. It may not cover all possible information. If you have questions about this medicine, talk to your doctor, pharmacist, or health care provider.  2020 Elsevier/Gold Standard (2019-02-18 00:21:31)

## 2020-04-10 ENCOUNTER — Telehealth: Payer: Self-pay | Admitting: Neurology

## 2020-04-10 DIAGNOSIS — E109 Type 1 diabetes mellitus without complications: Secondary | ICD-10-CM | POA: Diagnosis not present

## 2020-04-10 LAB — PREGNANCY, URINE: Preg Test, Ur: NEGATIVE

## 2020-04-10 NOTE — Telephone Encounter (Signed)
Medicaid order sent to [facility] they will reach out to the patient to scheduled and obtain the auth .

## 2020-04-14 ENCOUNTER — Ambulatory Visit: Payer: Medicaid Other | Admitting: Internal Medicine

## 2020-04-14 DIAGNOSIS — D631 Anemia in chronic kidney disease: Secondary | ICD-10-CM | POA: Diagnosis not present

## 2020-04-14 DIAGNOSIS — N184 Chronic kidney disease, stage 4 (severe): Secondary | ICD-10-CM | POA: Diagnosis not present

## 2020-05-06 MED FILL — NovoLOG 100 UNIT/ML SOLN: 100 | 50 days supply | Qty: 20 | Fill #0

## 2020-05-07 ENCOUNTER — Other Ambulatory Visit: Payer: Medicaid Other

## 2020-05-08 ENCOUNTER — Encounter: Payer: Self-pay | Admitting: Internal Medicine

## 2020-05-08 ENCOUNTER — Ambulatory Visit: Payer: Medicaid Other | Attending: Internal Medicine | Admitting: Internal Medicine

## 2020-05-08 VITALS — BP 178/112 | HR 88 | Temp 98.2°F | Resp 16 | Ht 65.0 in | Wt 135.2 lb

## 2020-05-08 DIAGNOSIS — Z794 Long term (current) use of insulin: Secondary | ICD-10-CM | POA: Diagnosis not present

## 2020-05-08 DIAGNOSIS — Z9114 Patient's other noncompliance with medication regimen: Secondary | ICD-10-CM | POA: Diagnosis not present

## 2020-05-08 DIAGNOSIS — I10 Essential (primary) hypertension: Secondary | ICD-10-CM | POA: Diagnosis not present

## 2020-05-08 DIAGNOSIS — Z8349 Family history of other endocrine, nutritional and metabolic diseases: Secondary | ICD-10-CM | POA: Insufficient documentation

## 2020-05-08 DIAGNOSIS — N2581 Secondary hyperparathyroidism of renal origin: Secondary | ICD-10-CM | POA: Diagnosis not present

## 2020-05-08 DIAGNOSIS — N184 Chronic kidney disease, stage 4 (severe): Secondary | ICD-10-CM | POA: Diagnosis not present

## 2020-05-08 DIAGNOSIS — F3342 Major depressive disorder, recurrent, in full remission: Secondary | ICD-10-CM | POA: Diagnosis not present

## 2020-05-08 DIAGNOSIS — Z9641 Presence of insulin pump (external) (internal): Secondary | ICD-10-CM | POA: Diagnosis not present

## 2020-05-08 DIAGNOSIS — G4700 Insomnia, unspecified: Secondary | ICD-10-CM | POA: Insufficient documentation

## 2020-05-08 DIAGNOSIS — D631 Anemia in chronic kidney disease: Secondary | ICD-10-CM | POA: Diagnosis not present

## 2020-05-08 DIAGNOSIS — E1042 Type 1 diabetes mellitus with diabetic polyneuropathy: Secondary | ICD-10-CM | POA: Insufficient documentation

## 2020-05-08 DIAGNOSIS — F419 Anxiety disorder, unspecified: Secondary | ICD-10-CM | POA: Insufficient documentation

## 2020-05-08 DIAGNOSIS — E785 Hyperlipidemia, unspecified: Secondary | ICD-10-CM | POA: Insufficient documentation

## 2020-05-08 DIAGNOSIS — K219 Gastro-esophageal reflux disease without esophagitis: Secondary | ICD-10-CM | POA: Diagnosis not present

## 2020-05-08 DIAGNOSIS — E039 Hypothyroidism, unspecified: Secondary | ICD-10-CM | POA: Insufficient documentation

## 2020-05-08 DIAGNOSIS — Z888 Allergy status to other drugs, medicaments and biological substances status: Secondary | ICD-10-CM | POA: Insufficient documentation

## 2020-05-08 DIAGNOSIS — Z79899 Other long term (current) drug therapy: Secondary | ICD-10-CM | POA: Diagnosis not present

## 2020-05-08 DIAGNOSIS — G43709 Chronic migraine without aura, not intractable, without status migrainosus: Secondary | ICD-10-CM | POA: Insufficient documentation

## 2020-05-08 DIAGNOSIS — Z560 Unemployment, unspecified: Secondary | ICD-10-CM | POA: Diagnosis not present

## 2020-05-08 DIAGNOSIS — K3184 Gastroparesis: Secondary | ICD-10-CM | POA: Insufficient documentation

## 2020-05-08 DIAGNOSIS — Z87891 Personal history of nicotine dependence: Secondary | ICD-10-CM | POA: Diagnosis not present

## 2020-05-08 DIAGNOSIS — I129 Hypertensive chronic kidney disease with stage 1 through stage 4 chronic kidney disease, or unspecified chronic kidney disease: Secondary | ICD-10-CM | POA: Diagnosis not present

## 2020-05-08 DIAGNOSIS — E049 Nontoxic goiter, unspecified: Secondary | ICD-10-CM | POA: Diagnosis not present

## 2020-05-08 DIAGNOSIS — E1022 Type 1 diabetes mellitus with diabetic chronic kidney disease: Secondary | ICD-10-CM | POA: Insufficient documentation

## 2020-05-08 DIAGNOSIS — E1029 Type 1 diabetes mellitus with other diabetic kidney complication: Secondary | ICD-10-CM

## 2020-05-08 DIAGNOSIS — Z79890 Hormone replacement therapy (postmenopausal): Secondary | ICD-10-CM | POA: Insufficient documentation

## 2020-05-08 DIAGNOSIS — E1043 Type 1 diabetes mellitus with diabetic autonomic (poly)neuropathy: Secondary | ICD-10-CM | POA: Diagnosis not present

## 2020-05-08 DIAGNOSIS — N319 Neuromuscular dysfunction of bladder, unspecified: Secondary | ICD-10-CM | POA: Diagnosis not present

## 2020-05-08 DIAGNOSIS — G43009 Migraine without aura, not intractable, without status migrainosus: Secondary | ICD-10-CM

## 2020-05-08 DIAGNOSIS — Z833 Family history of diabetes mellitus: Secondary | ICD-10-CM | POA: Insufficient documentation

## 2020-05-08 DIAGNOSIS — Z8249 Family history of ischemic heart disease and other diseases of the circulatory system: Secondary | ICD-10-CM | POA: Insufficient documentation

## 2020-05-08 DIAGNOSIS — Z881 Allergy status to other antibiotic agents status: Secondary | ICD-10-CM | POA: Insufficient documentation

## 2020-05-08 LAB — POCT GLYCOSYLATED HEMOGLOBIN (HGB A1C): HbA1c, POC (controlled diabetic range): 11 % — AB (ref 0.0–7.0)

## 2020-05-08 LAB — GLUCOSE, POCT (MANUAL RESULT ENTRY): POC Glucose: 197 mg/dl — AB (ref 70–99)

## 2020-05-08 MED ORDER — TRESIBA 100 UNIT/ML ~~LOC~~ SOLN: 22.0000 [IU] | Freq: Every day | SUBCUTANEOUS | 3 refills | Status: DC

## 2020-05-08 MED ORDER — INSULIN ASPART 100 UNIT/ML ~~LOC~~ SOLN: SUBCUTANEOUS | 2 refills | Status: DC

## 2020-05-08 MED ORDER — GABAPENTIN 300 MG PO CAPS: 600.0000 mg | ORAL_CAPSULE | Freq: Two times a day (BID) | ORAL | 6 refills | Status: DC

## 2020-05-08 NOTE — Patient Instructions (Addendum)
Increase Treshiba to 22 units daily.  Take Novolog with each meal.  Restart your blood pressure medications: Norvasc, Hydralazine, Losartan (Cozaar).  Check blood pressure every 2-3 days.  If still greater than 130/80, then add the Cardizem also.   Increase Gabapentin to 600 mg twice a day.
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
## 2020-05-08 NOTE — Progress Notes (Signed)
Pt states her whole body hurts

## 2020-05-11 DIAGNOSIS — E1022 Type 1 diabetes mellitus with diabetic chronic kidney disease: Secondary | ICD-10-CM | POA: Diagnosis not present

## 2020-05-11 DIAGNOSIS — Z23 Encounter for immunization: Secondary | ICD-10-CM | POA: Diagnosis not present

## 2020-05-12 DIAGNOSIS — N184 Chronic kidney disease, stage 4 (severe): Secondary | ICD-10-CM | POA: Diagnosis not present

## 2020-05-12 DIAGNOSIS — D631 Anemia in chronic kidney disease: Secondary | ICD-10-CM | POA: Diagnosis not present

## 2020-05-26 DIAGNOSIS — D631 Anemia in chronic kidney disease: Secondary | ICD-10-CM | POA: Diagnosis not present

## 2020-05-26 DIAGNOSIS — N184 Chronic kidney disease, stage 4 (severe): Secondary | ICD-10-CM | POA: Diagnosis not present

## 2020-06-03 DIAGNOSIS — H25813 Combined forms of age-related cataract, bilateral: Secondary | ICD-10-CM | POA: Diagnosis not present

## 2020-06-03 DIAGNOSIS — H52223 Regular astigmatism, bilateral: Secondary | ICD-10-CM | POA: Diagnosis not present

## 2020-06-03 DIAGNOSIS — H524 Presbyopia: Secondary | ICD-10-CM | POA: Diagnosis not present

## 2020-06-03 DIAGNOSIS — H35713 Central serous chorioretinopathy, bilateral: Secondary | ICD-10-CM | POA: Diagnosis not present

## 2020-06-03 DIAGNOSIS — E113593 Type 2 diabetes mellitus with proliferative diabetic retinopathy without macular edema, bilateral: Secondary | ICD-10-CM | POA: Diagnosis not present

## 2020-06-03 DIAGNOSIS — H5213 Myopia, bilateral: Secondary | ICD-10-CM | POA: Diagnosis not present

## 2020-06-03 DIAGNOSIS — H35033 Hypertensive retinopathy, bilateral: Secondary | ICD-10-CM | POA: Diagnosis not present

## 2020-06-06 ENCOUNTER — Ambulatory Visit
Admission: RE | Admit: 2020-06-06 | Discharge: 2020-06-06 | Disposition: A | Discharge status: Home / Self Care | Payer: Medicaid Other | Source: Ambulatory Visit | Attending: Neurology | Admitting: Neurology

## 2020-06-06 ENCOUNTER — Other Ambulatory Visit: Payer: Self-pay

## 2020-06-06 DIAGNOSIS — R519 Headache, unspecified: Secondary | ICD-10-CM

## 2020-06-06 DIAGNOSIS — R51 Headache with orthostatic component, not elsewhere classified: Secondary | ICD-10-CM

## 2020-06-06 DIAGNOSIS — G4484 Primary exertional headache: Secondary | ICD-10-CM

## 2020-06-06 DIAGNOSIS — H93A9 Pulsatile tinnitus, unspecified ear: Secondary | ICD-10-CM

## 2020-06-06 DIAGNOSIS — H539 Unspecified visual disturbance: Secondary | ICD-10-CM

## 2020-06-08 ENCOUNTER — Telehealth: Payer: Self-pay | Admitting: Neurology

## 2020-06-08 NOTE — Telephone Encounter (Signed)
Called the patient and advised of the MRI and MRA findings. Advised there was nothing that appeared concerning and both were normal. Informed of the right chronic maxillary sinusitis that was found on MRI of the brain/ informed her to discuss with PCP if becomes bothersome. She verbalized understanding of the results and had no questions.

## 2020-06-08 NOTE — Progress Notes (Signed)
There is mucoperiosteal thickening and large mucous retention cyst within the right maxillary sinus.  Paranasal sinuses appear normal.  Flow voids are identified within the major intracerebral arteries.      IMPRESSION: This MRI of the brain without contrast shows the following: 1.    The brain appears normal. 2.    Right chronic maxillary sinusitis. 3.    There are no acute findings.    INTERPRETING PHYSICIAN:  Richard A. Felecia Shelling, MD, PhD, FAAN Certified in  [place] by [employer]

## 2020-06-08 NOTE — Telephone Encounter (Signed)
-----   Message from Larey Seat, MD sent at 06/08/2020 12:58 PM EDT ----- The imaged extracranial and intracranial portions of the internal carotid arteries appear normal. The middle cerebral and anterior cerebral arteries appear normal.  Posterior communicating arteries are present bilaterally.  In the posterior circulation, the vertebral arteries are co-dominant. No stenosis is noted within the vertebral arteries and the basilar arteries.  The cerebellar arteries appear normal.  The posterior cerebral arteries appear normal.   No aneurysms were identified.   IMPRESSION: This is a normal MR angiogram of the intracranial arteries.

## 2020-06-08 NOTE — Progress Notes (Signed)
The imaged extracranial and intracranial portions of the internal carotid arteries appear normal. The middle cerebral and anterior cerebral arteries appear normal.  Posterior communicating arteries are present bilaterally.  In the posterior circulation, the vertebral arteries are co-dominant. No stenosis is noted within the vertebral arteries and the basilar arteries.  The cerebellar arteries appear normal.  The posterior cerebral arteries appear normal.   No aneurysms were identified.   IMPRESSION: This is a normal MR angiogram of the intracranial arteries.

## 2020-06-10 DIAGNOSIS — E109 Type 1 diabetes mellitus without complications: Secondary | ICD-10-CM | POA: Diagnosis not present

## 2020-06-12 ENCOUNTER — Other Ambulatory Visit: Payer: Self-pay | Admitting: Internal Medicine

## 2020-06-12 DIAGNOSIS — Z1231 Encounter for screening mammogram for malignant neoplasm of breast: Secondary | ICD-10-CM

## 2020-06-12 DIAGNOSIS — D631 Anemia in chronic kidney disease: Secondary | ICD-10-CM | POA: Diagnosis not present

## 2020-06-12 DIAGNOSIS — N184 Chronic kidney disease, stage 4 (severe): Secondary | ICD-10-CM | POA: Diagnosis not present

## 2020-06-17 ENCOUNTER — Encounter: Payer: Self-pay | Admitting: Internal Medicine

## 2020-06-19 ENCOUNTER — Encounter: Payer: Self-pay | Admitting: Internal Medicine

## 2020-06-19 ENCOUNTER — Emergency Department (HOSPITAL_COMMUNITY)
Admission: EM | Admit: 2020-06-19 | Discharge: 2020-06-20 | Disposition: A | Discharge status: Home / Self Care | Payer: Medicaid Other | Attending: Emergency Medicine | Admitting: Emergency Medicine

## 2020-06-19 ENCOUNTER — Encounter (HOSPITAL_COMMUNITY): Payer: Self-pay

## 2020-06-19 DIAGNOSIS — S70312A Abrasion, left thigh, initial encounter: Secondary | ICD-10-CM | POA: Diagnosis not present

## 2020-06-19 DIAGNOSIS — Y999 Unspecified external cause status: Secondary | ICD-10-CM | POA: Diagnosis not present

## 2020-06-19 DIAGNOSIS — W503XXA Accidental bite by another person, initial encounter: Secondary | ICD-10-CM | POA: Insufficient documentation

## 2020-06-19 DIAGNOSIS — Y939 Activity, unspecified: Secondary | ICD-10-CM | POA: Diagnosis not present

## 2020-06-19 DIAGNOSIS — M542 Cervicalgia: Secondary | ICD-10-CM | POA: Insufficient documentation

## 2020-06-19 DIAGNOSIS — Z23 Encounter for immunization: Secondary | ICD-10-CM | POA: Insufficient documentation

## 2020-06-19 DIAGNOSIS — M546 Pain in thoracic spine: Secondary | ICD-10-CM | POA: Insufficient documentation

## 2020-06-19 DIAGNOSIS — S71152A Open bite, left thigh, initial encounter: Secondary | ICD-10-CM | POA: Diagnosis not present

## 2020-06-19 DIAGNOSIS — Y929 Unspecified place or not applicable: Secondary | ICD-10-CM | POA: Insufficient documentation

## 2020-06-19 LAB — I-STAT BETA HCG BLOOD, ED (MC, WL, AP ONLY): I-stat hCG, quantitative: <5 m[IU]/mL (ref <5)

## 2020-06-19 LAB — COMPREHENSIVE METABOLIC PANEL
ALT: 15 U/L (ref 0–44)
AST: 18 U/L (ref 15–41)
Albumin: 3.7 g/dL (ref 3.5–5.0)
Alkaline Phosphatase: 99 U/L (ref 38–126)
Anion gap: 9 (ref 5–15)
BUN: 34 mg/dL — ABNORMAL HIGH (ref 6–20)
CO2: 25 mmol/L (ref 22–32)
Calcium: 9.6 mg/dL (ref 8.9–10.3)
Chloride: 104 mmol/L (ref 98–111)
Creatinine, Ser: 5.32 mg/dL — ABNORMAL HIGH (ref 0.44–1.00)
GFR calc Af Amer: 11 mL/min — ABNORMAL LOW (ref >60)
GFR calc non Af Amer: 10 mL/min — ABNORMAL LOW (ref >60)
Glucose, Bld: 103 mg/dL — ABNORMAL HIGH (ref 70–99)
Potassium: 4.8 mmol/L (ref 3.5–5.1)
Sodium: 138 mmol/L (ref 135–145)
Total Bilirubin: 0.4 mg/dL (ref 0.3–1.2)
Total Protein: 6.2 g/dL — ABNORMAL LOW (ref 6.5–8.1)

## 2020-06-19 LAB — CBC WITH DIFFERENTIAL/PLATELET
Abs Immature Granulocytes: 0.04 10*3/uL (ref 0.00–0.07)
Basophils Absolute: 0 10*3/uL (ref 0.0–0.1)
Basophils Relative: 0 %
Eosinophils Absolute: 0.1 10*3/uL (ref 0.0–0.5)
Eosinophils Relative: 2 %
HCT: 33.4 % — ABNORMAL LOW (ref 36.0–46.0)
Hemoglobin: 10 g/dL — ABNORMAL LOW (ref 12.0–15.0)
Immature Granulocytes: 1 %
Lymphocytes Relative: 41 %
Lymphs Abs: 2.3 10*3/uL (ref 0.7–4.0)
MCH: 28.7 pg (ref 26.0–34.0)
MCHC: 29.9 g/dL — ABNORMAL LOW (ref 30.0–36.0)
MCV: 96 fL (ref 80.0–100.0)
Monocytes Absolute: 0.5 10*3/uL (ref 0.1–1.0)
Monocytes Relative: 8 %
Neutro Abs: 2.7 10*3/uL (ref 1.7–7.7)
Neutrophils Relative %: 48 %
Platelets: 185 10*3/uL (ref 150–400)
RBC: 3.48 MIL/uL — ABNORMAL LOW (ref 3.87–5.11)
RDW: 14.2 % (ref 11.5–15.5)
WBC: 5.7 10*3/uL (ref 4.0–10.5)
nRBC: 0 % (ref 0.0–0.2)

## 2020-06-19 LAB — URINALYSIS, ROUTINE W REFLEX MICROSCOPIC
Bacteria, UA: NONE SEEN
Bilirubin Urine: NEGATIVE
Glucose, UA: NEGATIVE mg/dL
Hgb urine dipstick: NEGATIVE
Ketones, ur: NEGATIVE mg/dL
Leukocytes,Ua: NEGATIVE
Nitrite: NEGATIVE
Protein, ur: >=300 mg/dL — AB
Specific Gravity, Urine: 1.012 (ref 1.005–1.030)
pH: 6 (ref 5.0–8.0)

## 2020-06-19 LAB — LACTIC ACID, PLASMA: Lactic Acid, Venous: 1 mmol/L (ref 0.5–1.9)

## 2020-06-19 MED ORDER — SODIUM CHLORIDE 0.9% FLUSH: 3.0000 mL | Freq: Once | INTRAVENOUS | Status: DC
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
## 2020-06-20 ENCOUNTER — Other Ambulatory Visit: Payer: Self-pay

## 2020-06-20 LAB — CBG MONITORING, ED: Glucose-Capillary: 77 mg/dL (ref 70–99)

## 2020-06-20 LAB — LACTIC ACID, PLASMA: Lactic Acid, Venous: 1.3 mmol/L (ref 0.5–1.9)

## 2020-06-20 MED ORDER — CYCLOBENZAPRINE HCL 10 MG PO TABS
10.0000 mg | ORAL_TABLET | Freq: Three times a day (TID) | ORAL | 0 refills | Status: DC | PRN
Start: 2020-06-20 — End: 2020-11-10

## 2020-06-20 MED ORDER — TETANUS-DIPHTH-ACELL PERTUSSIS 5-2.5-18.5 LF-MCG/0.5 IM SUSP
0.5000 mL | Freq: Once | INTRAMUSCULAR | Status: AC
  Administered 2020-06-20: 0.5 mL via INTRAMUSCULAR
  Filled 2020-06-20: qty 0.5

## 2020-06-20 MED ORDER — AMOXICILLIN-POT CLAVULANATE 875-125 MG PO TABS
1.0000 | ORAL_TABLET | Freq: Two times a day (BID) | ORAL | 0 refills | Status: DC
Start: 2020-06-20 — End: 2020-11-10
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
## 2020-06-20 NOTE — ED Notes (Signed)
Did not take BP meds last time taking it was Thursday morning.

## 2020-06-24 ENCOUNTER — Emergency Department (HOSPITAL_COMMUNITY)
Admission: EM | Admit: 2020-06-24 | Discharge: 2020-06-25 | Disposition: A | Discharge status: Home / Self Care | Payer: Medicaid Other | Attending: Emergency Medicine | Admitting: Emergency Medicine

## 2020-06-24 ENCOUNTER — Emergency Department (HOSPITAL_COMMUNITY): Payer: Medicaid Other

## 2020-06-24 ENCOUNTER — Encounter (HOSPITAL_COMMUNITY): Payer: Self-pay | Admitting: Emergency Medicine

## 2020-06-24 DIAGNOSIS — N1831 Chronic kidney disease, stage 3a: Secondary | ICD-10-CM | POA: Diagnosis not present

## 2020-06-24 DIAGNOSIS — E039 Hypothyroidism, unspecified: Secondary | ICD-10-CM | POA: Diagnosis not present

## 2020-06-24 DIAGNOSIS — E1040 Type 1 diabetes mellitus with diabetic neuropathy, unspecified: Secondary | ICD-10-CM | POA: Diagnosis not present

## 2020-06-24 DIAGNOSIS — I160 Hypertensive urgency: Secondary | ICD-10-CM | POA: Diagnosis not present

## 2020-06-24 DIAGNOSIS — R079 Chest pain, unspecified: Secondary | ICD-10-CM | POA: Diagnosis not present

## 2020-06-24 DIAGNOSIS — R2243 Localized swelling, mass and lump, lower limb, bilateral: Secondary | ICD-10-CM | POA: Diagnosis not present

## 2020-06-24 DIAGNOSIS — Z79899 Other long term (current) drug therapy: Secondary | ICD-10-CM | POA: Diagnosis not present

## 2020-06-24 DIAGNOSIS — J45909 Unspecified asthma, uncomplicated: Secondary | ICD-10-CM | POA: Diagnosis not present

## 2020-06-24 DIAGNOSIS — Z794 Long term (current) use of insulin: Secondary | ICD-10-CM | POA: Insufficient documentation

## 2020-06-24 DIAGNOSIS — Z20822 Contact with and (suspected) exposure to covid-19: Secondary | ICD-10-CM | POA: Insufficient documentation

## 2020-06-24 DIAGNOSIS — I129 Hypertensive chronic kidney disease with stage 1 through stage 4 chronic kidney disease, or unspecified chronic kidney disease: Secondary | ICD-10-CM | POA: Insufficient documentation

## 2020-06-24 DIAGNOSIS — E860 Dehydration: Secondary | ICD-10-CM | POA: Diagnosis not present

## 2020-06-24 DIAGNOSIS — R0789 Other chest pain: Secondary | ICD-10-CM | POA: Diagnosis not present

## 2020-06-24 DIAGNOSIS — I10 Essential (primary) hypertension: Secondary | ICD-10-CM | POA: Diagnosis not present

## 2020-06-24 DIAGNOSIS — R0602 Shortness of breath: Secondary | ICD-10-CM | POA: Diagnosis not present

## 2020-06-24 LAB — CBC
HCT: 38.5 % (ref 36.0–46.0)
Hemoglobin: 11.6 g/dL — ABNORMAL LOW (ref 12.0–15.0)
MCH: 28.9 pg (ref 26.0–34.0)
MCHC: 30.1 g/dL (ref 30.0–36.0)
MCV: 95.8 fL (ref 80.0–100.0)
Platelets: 193 10*3/uL (ref 150–400)
RBC: 4.02 MIL/uL (ref 3.87–5.11)
RDW: 14.8 % (ref 11.5–15.5)
WBC: 5.1 10*3/uL (ref 4.0–10.5)
nRBC: 0 % (ref 0.0–0.2)

## 2020-06-24 LAB — BASIC METABOLIC PANEL
Anion gap: 12 (ref 5–15)
BUN: 32 mg/dL — ABNORMAL HIGH (ref 6–20)
CO2: 24 mmol/L (ref 22–32)
Calcium: 9.8 mg/dL (ref 8.9–10.3)
Chloride: 102 mmol/L (ref 98–111)
Creatinine, Ser: 4.58 mg/dL — ABNORMAL HIGH (ref 0.44–1.00)
GFR calc Af Amer: 13 mL/min — ABNORMAL LOW (ref >60)
GFR calc non Af Amer: 12 mL/min — ABNORMAL LOW (ref >60)
Glucose, Bld: 173 mg/dL — ABNORMAL HIGH (ref 70–99)
Potassium: 4.7 mmol/L (ref 3.5–5.1)
Sodium: 138 mmol/L (ref 135–145)

## 2020-06-24 LAB — HEPATIC FUNCTION PANEL
ALT: 17 U/L (ref 0–44)
AST: 23 U/L (ref 15–41)
Albumin: 4.3 g/dL (ref 3.5–5.0)
Alkaline Phosphatase: 91 U/L (ref 38–126)
Bilirubin, Direct: 0.1 mg/dL (ref 0.0–0.2)
Indirect Bilirubin: 0.8 mg/dL (ref 0.3–0.9)
Total Bilirubin: 0.9 mg/dL (ref 0.3–1.2)
Total Protein: 7.4 g/dL (ref 6.5–8.1)

## 2020-06-24 LAB — I-STAT BETA HCG BLOOD, ED (NOT ORDERABLE): I-stat hCG, quantitative: <5 m[IU]/mL (ref <5)

## 2020-06-24 LAB — SARS CORONAVIRUS 2 BY RT PCR (HOSPITAL ORDER, PERFORMED IN ~~LOC~~ HOSPITAL LAB): SARS Coronavirus 2: NEGATIVE

## 2020-06-24 LAB — TROPONIN I (HIGH SENSITIVITY)
Troponin I (High Sensitivity): 7 ng/L (ref <18)
Troponin I (High Sensitivity): 8 ng/L (ref <18)

## 2020-06-24 LAB — CBG MONITORING, ED: Glucose-Capillary: 332 mg/dL — ABNORMAL HIGH (ref 70–99)

## 2020-06-24 LAB — D-DIMER, QUANTITATIVE: D-Dimer, Quant: 0.64 ug/mL-FEU — ABNORMAL HIGH (ref 0.00–0.50)

## 2020-06-24 LAB — LIPASE, BLOOD: Lipase: 48 U/L (ref 11–51)

## 2020-06-24 MED ORDER — AMLODIPINE BESYLATE 5 MG PO TABS
10.0000 mg | ORAL_TABLET | Freq: Once | ORAL | Status: AC
  Administered 2020-06-25: 10 mg via ORAL
  Filled 2020-06-24: qty 2

## 2020-06-24 MED ORDER — LOSARTAN POTASSIUM 50 MG PO TABS
50.0000 mg | ORAL_TABLET | Freq: Once | ORAL | Status: AC
  Administered 2020-06-25: 50 mg via ORAL
  Filled 2020-06-24: qty 1

## 2020-06-24 MED ORDER — HYDRALAZINE HCL 25 MG PO TABS
50.0000 mg | ORAL_TABLET | Freq: Once | ORAL | Status: DC
  Filled 2020-06-24: qty 2

## 2020-06-24 MED ORDER — ONDANSETRON HCL 4 MG/2ML IJ SOLN
4.0000 mg | Freq: Once | INTRAMUSCULAR | Status: AC
  Administered 2020-06-24: 4 mg via INTRAVENOUS
  Filled 2020-06-24: qty 2

## 2020-06-24 MED ORDER — HYDRALAZINE HCL 20 MG/ML IJ SOLN
10.0000 mg | Freq: Once | INTRAMUSCULAR | Status: AC
  Administered 2020-06-24: 10 mg via INTRAVENOUS
  Filled 2020-06-24: qty 1

## 2020-06-24 MED ORDER — SODIUM CHLORIDE 0.9% FLUSH
3.0000 mL | Freq: Once | INTRAVENOUS | Status: AC
  Administered 2020-06-24: 3 mL via INTRAVENOUS
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
## 2020-06-24 NOTE — ED Triage Notes (Signed)
Pt reports that has kidney disease and kidney functioning is getting worse. C/o chest pains x 2 days.

## 2020-06-24 NOTE — ED Notes (Signed)
Pt ambulated in room with no difficulty. Pt's o2sat stayed at 100% during ambulation. Pt stated she felt shob and began coughing after getting back into bed.

## 2020-06-25 ENCOUNTER — Emergency Department (HOSPITAL_COMMUNITY): Payer: Medicaid Other

## 2020-06-25 DIAGNOSIS — R0602 Shortness of breath: Secondary | ICD-10-CM | POA: Diagnosis not present

## 2020-06-25 DIAGNOSIS — I160 Hypertensive urgency: Secondary | ICD-10-CM

## 2020-06-25 LAB — URINALYSIS, ROUTINE W REFLEX MICROSCOPIC
Bacteria, UA: NONE SEEN
Bilirubin Urine: NEGATIVE
Glucose, UA: >=500 mg/dL — AB
Hgb urine dipstick: NEGATIVE
Ketones, ur: NEGATIVE mg/dL
Leukocytes,Ua: NEGATIVE
Nitrite: NEGATIVE
Protein, ur: >=300 mg/dL — AB
Specific Gravity, Urine: 1.011 (ref 1.005–1.030)
pH: 7 (ref 5.0–8.0)

## 2020-06-25 LAB — CBG MONITORING, ED: Glucose-Capillary: 78 mg/dL (ref 70–99)

## 2020-06-25 MED ORDER — SODIUM CHLORIDE 0.9 % IV BOLUS: 500.0000 mL | Freq: Once | INTRAVENOUS | Status: DC

## 2020-06-25 MED ORDER — METOCLOPRAMIDE HCL 5 MG/ML IJ SOLN: 10.0000 mg | Freq: Once | INTRAMUSCULAR | Status: AC

## 2020-06-25 MED ORDER — METOCLOPRAMIDE HCL 5 MG/ML IJ SOLN
INTRAMUSCULAR | Status: AC
  Administered 2020-06-25: 10 mg via INTRAVENOUS
  Filled 2020-06-25: qty 2

## 2020-06-25 MED ORDER — INSULIN ASPART 100 UNIT/ML ~~LOC~~ SOLN
10.0000 [IU] | Freq: Once | SUBCUTANEOUS | Status: AC
  Administered 2020-06-25: 10 [IU] via SUBCUTANEOUS
  Filled 2020-06-25: qty 0.1

## 2020-06-25 MED ORDER — HYDROMORPHONE HCL 1 MG/ML IJ SOLN
1.0000 mg | Freq: Once | INTRAMUSCULAR | Status: AC
  Administered 2020-06-25: 1 mg via INTRAVENOUS
  Filled 2020-06-25: qty 1

## 2020-06-25 MED ORDER — HYDROMORPHONE HCL 1 MG/ML IJ SOLN
0.5000 mg | Freq: Once | INTRAMUSCULAR | Status: AC
  Administered 2020-06-25: 0.5 mg via INTRAVENOUS
  Filled 2020-06-25: qty 1

## 2020-06-25 MED ORDER — SODIUM CHLORIDE 0.9 % IV SOLN: Freq: Once | INTRAVENOUS | Status: AC

## 2020-06-25 MED ORDER — TECHNETIUM TO 99M ALBUMIN AGGREGATED
4.3000 | Freq: Once | INTRAVENOUS | Status: AC | PRN
  Administered 2020-06-25: 4.3 via INTRAVENOUS

## 2020-06-25 MED ORDER — HYDRALAZINE HCL 20 MG/ML IJ SOLN
10.0000 mg | Freq: Once | INTRAMUSCULAR | Status: AC
  Administered 2020-06-25: 10 mg via INTRAVENOUS
  Filled 2020-06-25: qty 1
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
## 2020-06-25 NOTE — ED Provider Notes (Signed)
  Physical Exam  BP (!) 164/100   Pulse (!) 112   Temp 98 F (36.7 C) (Oral)   Resp 16   Ht 5\' 5"  (1.651 m)   Wt 62.6 kg   LMP 06/10/2020   SpO2 97%   BMI 22.96 kg/m   Physical Exam  ED Course/Procedures   Clinical Course as of Jun 25 1016  Wed Jun 24, 2020  2056 Hemoglobin(!): 11.6 [CA]  2056 BUN(!): 32 [CA]  2056 Creatinine(!): 4.58 [CA]  2056 Troponin I (High Sensitivity): 7 [CA]  2154 D-Dimer, Quant(!): 0.64 [CA]  2241 BP(!): 207/119 [CA]  2244 BP(!): 207/119 [CA]    Clinical Course User Index [CA] Suzy Bouchard, PA-C    Procedures  MDM  Patient has been seen by hospitalist and cleared for discharge.  Will discharge with her recommendations.      Davonna Belling, MD 06/25/20 1017

## 2020-06-25 NOTE — ED Notes (Signed)
Pt. CBG 78, RN,Alaina made aware.

## 2020-06-25 NOTE — Discharge Instructions (Addendum)
Follow-up with your doctor as discussed with the hospitalist.

## 2020-06-25 NOTE — ED Notes (Signed)
Pt transported to [facility] 

## 2020-06-26 LAB — URINE CULTURE: Culture: NO GROWTH

## 2020-06-29 ENCOUNTER — Encounter: Payer: Self-pay | Admitting: Internal Medicine

## 2020-06-30 ENCOUNTER — Encounter (INDEPENDENT_AMBULATORY_CARE_PROVIDER_SITE_OTHER): Payer: Medicaid Other | Admitting: Ophthalmology

## 2020-06-30 DIAGNOSIS — I120 Hypertensive chronic kidney disease with stage 5 chronic kidney disease or end stage renal disease: Secondary | ICD-10-CM | POA: Diagnosis not present

## 2020-06-30 DIAGNOSIS — K3184 Gastroparesis: Secondary | ICD-10-CM | POA: Diagnosis not present

## 2020-06-30 DIAGNOSIS — D631 Anemia in chronic kidney disease: Secondary | ICD-10-CM | POA: Diagnosis not present

## 2020-06-30 DIAGNOSIS — N179 Acute kidney failure, unspecified: Secondary | ICD-10-CM | POA: Diagnosis not present

## 2020-06-30 DIAGNOSIS — Z79899 Other long term (current) drug therapy: Secondary | ICD-10-CM | POA: Diagnosis not present

## 2020-06-30 DIAGNOSIS — E039 Hypothyroidism, unspecified: Secondary | ICD-10-CM | POA: Diagnosis not present

## 2020-06-30 DIAGNOSIS — N185 Chronic kidney disease, stage 5: Secondary | ICD-10-CM | POA: Diagnosis not present

## 2020-06-30 DIAGNOSIS — E1021 Type 1 diabetes mellitus with diabetic nephropathy: Secondary | ICD-10-CM | POA: Diagnosis not present

## 2020-06-30 DIAGNOSIS — D509 Iron deficiency anemia, unspecified: Secondary | ICD-10-CM | POA: Diagnosis not present

## 2020-06-30 DIAGNOSIS — R5383 Other fatigue: Secondary | ICD-10-CM | POA: Diagnosis not present

## 2020-06-30 DIAGNOSIS — R0602 Shortness of breath: Secondary | ICD-10-CM | POA: Diagnosis not present

## 2020-06-30 DIAGNOSIS — Z794 Long term (current) use of insulin: Secondary | ICD-10-CM | POA: Diagnosis not present

## 2020-06-30 DIAGNOSIS — E1022 Type 1 diabetes mellitus with diabetic chronic kidney disease: Secondary | ICD-10-CM | POA: Diagnosis not present

## 2020-06-30 DIAGNOSIS — E1010 Type 1 diabetes mellitus with ketoacidosis without coma: Secondary | ICD-10-CM | POA: Diagnosis not present

## 2020-06-30 DIAGNOSIS — E1043 Type 1 diabetes mellitus with diabetic autonomic (poly)neuropathy: Secondary | ICD-10-CM | POA: Diagnosis not present

## 2020-06-30 DIAGNOSIS — N2581 Secondary hyperparathyroidism of renal origin: Secondary | ICD-10-CM | POA: Diagnosis not present

## 2020-06-30 LAB — CULTURE, BLOOD (ROUTINE X 2)
Culture: NO GROWTH
Culture: NO GROWTH
Special Requests: ADEQUATE

## 2020-06-30 MED ORDER — BENZONATATE 100 MG PO CAPS
100.0000 mg | ORAL_CAPSULE | Freq: Three times a day (TID) | ORAL | 0 refills | Status: DC | PRN
Start: 2020-06-30 — End: 2020-08-11

## 2020-07-07 DIAGNOSIS — E1022 Type 1 diabetes mellitus with diabetic chronic kidney disease: Secondary | ICD-10-CM | POA: Diagnosis not present

## 2020-07-07 DIAGNOSIS — Z20822 Contact with and (suspected) exposure to covid-19: Secondary | ICD-10-CM | POA: Diagnosis not present

## 2020-07-07 DIAGNOSIS — N184 Chronic kidney disease, stage 4 (severe): Secondary | ICD-10-CM | POA: Diagnosis not present

## 2020-07-07 DIAGNOSIS — Z01812 Encounter for preprocedural laboratory examination: Secondary | ICD-10-CM | POA: Diagnosis not present

## 2020-07-08 DIAGNOSIS — I313 Pericardial effusion (noninflammatory): Secondary | ICD-10-CM | POA: Diagnosis not present

## 2020-07-08 DIAGNOSIS — N184 Chronic kidney disease, stage 4 (severe): Secondary | ICD-10-CM | POA: Diagnosis not present

## 2020-07-08 DIAGNOSIS — E1022 Type 1 diabetes mellitus with diabetic chronic kidney disease: Secondary | ICD-10-CM | POA: Diagnosis not present

## 2020-07-08 DIAGNOSIS — I129 Hypertensive chronic kidney disease with stage 1 through stage 4 chronic kidney disease, or unspecified chronic kidney disease: Secondary | ICD-10-CM | POA: Diagnosis not present

## 2020-07-08 DIAGNOSIS — Z9114 Patient's other noncompliance with medication regimen: Secondary | ICD-10-CM | POA: Diagnosis not present

## 2020-07-08 DIAGNOSIS — E1065 Type 1 diabetes mellitus with hyperglycemia: Secondary | ICD-10-CM | POA: Diagnosis not present

## 2020-07-08 DIAGNOSIS — I083 Combined rheumatic disorders of mitral, aortic and tricuspid valves: Secondary | ICD-10-CM | POA: Diagnosis not present

## 2020-07-08 DIAGNOSIS — R079 Chest pain, unspecified: Secondary | ICD-10-CM | POA: Diagnosis not present

## 2020-07-08 DIAGNOSIS — Z01818 Encounter for other preprocedural examination: Secondary | ICD-10-CM | POA: Diagnosis not present

## 2020-07-08 DIAGNOSIS — Z794 Long term (current) use of insulin: Secondary | ICD-10-CM | POA: Diagnosis not present

## 2020-07-08 DIAGNOSIS — I088 Other rheumatic multiple valve diseases: Secondary | ICD-10-CM | POA: Diagnosis not present

## 2020-07-09 DIAGNOSIS — N186 End stage renal disease: Secondary | ICD-10-CM | POA: Diagnosis not present

## 2020-07-09 DIAGNOSIS — E1022 Type 1 diabetes mellitus with diabetic chronic kidney disease: Secondary | ICD-10-CM | POA: Diagnosis not present

## 2020-07-09 DIAGNOSIS — N184 Chronic kidney disease, stage 4 (severe): Secondary | ICD-10-CM | POA: Diagnosis not present

## 2020-07-10 ENCOUNTER — Encounter (INDEPENDENT_AMBULATORY_CARE_PROVIDER_SITE_OTHER): Payer: Medicaid Other | Admitting: Ophthalmology

## 2020-07-16 DIAGNOSIS — E109 Type 1 diabetes mellitus without complications: Secondary | ICD-10-CM | POA: Diagnosis not present

## 2020-07-21 DIAGNOSIS — D631 Anemia in chronic kidney disease: Secondary | ICD-10-CM | POA: Diagnosis not present

## 2020-07-21 DIAGNOSIS — N184 Chronic kidney disease, stage 4 (severe): Secondary | ICD-10-CM | POA: Diagnosis not present

## 2020-07-21 DIAGNOSIS — I129 Hypertensive chronic kidney disease with stage 1 through stage 4 chronic kidney disease, or unspecified chronic kidney disease: Secondary | ICD-10-CM | POA: Diagnosis not present

## 2020-07-24 DIAGNOSIS — N184 Chronic kidney disease, stage 4 (severe): Secondary | ICD-10-CM | POA: Diagnosis not present

## 2020-07-24 DIAGNOSIS — E785 Hyperlipidemia, unspecified: Secondary | ICD-10-CM | POA: Diagnosis not present

## 2020-07-24 DIAGNOSIS — I120 Hypertensive chronic kidney disease with stage 5 chronic kidney disease or end stage renal disease: Secondary | ICD-10-CM | POA: Diagnosis not present

## 2020-07-24 DIAGNOSIS — E1022 Type 1 diabetes mellitus with diabetic chronic kidney disease: Secondary | ICD-10-CM | POA: Diagnosis not present

## 2020-07-24 DIAGNOSIS — Z01812 Encounter for preprocedural laboratory examination: Secondary | ICD-10-CM | POA: Diagnosis not present

## 2020-07-24 DIAGNOSIS — Z20822 Contact with and (suspected) exposure to covid-19: Secondary | ICD-10-CM | POA: Diagnosis not present

## 2020-07-24 DIAGNOSIS — Z01810 Encounter for preprocedural cardiovascular examination: Secondary | ICD-10-CM | POA: Diagnosis not present

## 2020-07-24 DIAGNOSIS — N186 End stage renal disease: Secondary | ICD-10-CM | POA: Diagnosis not present

## 2020-07-24 DIAGNOSIS — I129 Hypertensive chronic kidney disease with stage 1 through stage 4 chronic kidney disease, or unspecified chronic kidney disease: Secondary | ICD-10-CM | POA: Diagnosis not present

## 2020-07-27 DIAGNOSIS — Z992 Dependence on renal dialysis: Secondary | ICD-10-CM | POA: Diagnosis not present

## 2020-07-27 DIAGNOSIS — N186 End stage renal disease: Secondary | ICD-10-CM | POA: Diagnosis not present

## 2020-08-11 ENCOUNTER — Other Ambulatory Visit: Payer: Self-pay | Admitting: Internal Medicine

## 2020-08-11 ENCOUNTER — Encounter: Payer: Self-pay | Admitting: Internal Medicine

## 2020-08-11 DIAGNOSIS — D631 Anemia in chronic kidney disease: Secondary | ICD-10-CM | POA: Diagnosis not present

## 2020-08-11 DIAGNOSIS — I129 Hypertensive chronic kidney disease with stage 1 through stage 4 chronic kidney disease, or unspecified chronic kidney disease: Secondary | ICD-10-CM | POA: Diagnosis not present

## 2020-08-11 DIAGNOSIS — Z87891 Personal history of nicotine dependence: Secondary | ICD-10-CM | POA: Diagnosis not present

## 2020-08-11 DIAGNOSIS — N184 Chronic kidney disease, stage 4 (severe): Secondary | ICD-10-CM | POA: Diagnosis not present

## 2020-08-11 DIAGNOSIS — J320 Chronic maxillary sinusitis: Secondary | ICD-10-CM

## 2020-08-11 MED ORDER — BENZONATATE 100 MG PO CAPS: 100.0000 mg | ORAL_CAPSULE | Freq: Three times a day (TID) | ORAL | 0 refills | Status: DC | PRN

## 2020-08-11 MED ORDER — ONDANSETRON HCL 4 MG PO TABS: 4.0000 mg | ORAL_TABLET | Freq: Every day | ORAL | 0 refills | Status: DC | PRN

## 2020-08-18 ENCOUNTER — Telehealth: Payer: Self-pay | Admitting: *Deleted

## 2020-08-18 NOTE — Telephone Encounter (Signed)
Spoke with pt who stated in the past she tried Zoloft for about 1 year and Depakote for 2-3 years. Emgality PA completed on Cover My Meds. Key: [id number]. Awaiting determination from [place] medicaid [employer].

## 2020-08-18 NOTE — Telephone Encounter (Signed)
Approved today  PA Case: [id number], Status: Approved, Coverage Starts on: 08/18/2020 12:00:00 AM, Coverage Ends on: 11/16/2020 12:00:00 AM.

## 2020-08-19 NOTE — Telephone Encounter (Signed)
Received approval notice from [employer]. I faxed this to the pt's pharmacy. Received a receipt of confirmation.
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
## 2020-08-28 DIAGNOSIS — J310 Chronic rhinitis: Secondary | ICD-10-CM | POA: Diagnosis not present

## 2020-08-31 ENCOUNTER — Encounter (INDEPENDENT_AMBULATORY_CARE_PROVIDER_SITE_OTHER): Payer: Self-pay | Admitting: Ophthalmology

## 2020-08-31 ENCOUNTER — Ambulatory Visit (INDEPENDENT_AMBULATORY_CARE_PROVIDER_SITE_OTHER): Payer: Medicaid Other | Admitting: Ophthalmology

## 2020-08-31 ENCOUNTER — Other Ambulatory Visit: Payer: Self-pay

## 2020-08-31 DIAGNOSIS — H35713 Central serous chorioretinopathy, bilateral: Secondary | ICD-10-CM

## 2020-08-31 DIAGNOSIS — H35033 Hypertensive retinopathy, bilateral: Secondary | ICD-10-CM | POA: Diagnosis not present

## 2020-08-31 DIAGNOSIS — I10 Essential (primary) hypertension: Secondary | ICD-10-CM | POA: Diagnosis not present

## 2020-08-31 DIAGNOSIS — H269 Unspecified cataract: Secondary | ICD-10-CM | POA: Diagnosis not present

## 2020-08-31 DIAGNOSIS — E103513 Type 1 diabetes mellitus with proliferative diabetic retinopathy with macular edema, bilateral: Secondary | ICD-10-CM | POA: Diagnosis not present

## 2020-08-31 DIAGNOSIS — H3581 Retinal edema: Secondary | ICD-10-CM | POA: Diagnosis not present

## 2020-08-31 MED ORDER — BEVACIZUMAB CHEMO INJECTION 1.25MG/0.05ML SYRINGE FOR KALEIDOSCOPE
1.2500 mg | INTRAVITREAL | Status: AC | PRN
  Administered 2020-08-31: 1.25 mg via INTRAVITREAL
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
## 2020-09-02 ENCOUNTER — Encounter (INDEPENDENT_AMBULATORY_CARE_PROVIDER_SITE_OTHER): Payer: Self-pay | Admitting: Ophthalmology

## 2020-09-02 ENCOUNTER — Other Ambulatory Visit: Payer: Self-pay

## 2020-09-02 ENCOUNTER — Ambulatory Visit (INDEPENDENT_AMBULATORY_CARE_PROVIDER_SITE_OTHER): Payer: Medicaid Other | Admitting: Ophthalmology

## 2020-09-02 DIAGNOSIS — E103513 Type 1 diabetes mellitus with proliferative diabetic retinopathy with macular edema, bilateral: Secondary | ICD-10-CM

## 2020-09-02 DIAGNOSIS — H3581 Retinal edema: Secondary | ICD-10-CM | POA: Diagnosis not present

## 2020-09-02 DIAGNOSIS — H35713 Central serous chorioretinopathy, bilateral: Secondary | ICD-10-CM

## 2020-09-02 DIAGNOSIS — H269 Unspecified cataract: Secondary | ICD-10-CM

## 2020-09-02 DIAGNOSIS — I10 Essential (primary) hypertension: Secondary | ICD-10-CM

## 2020-09-02 DIAGNOSIS — H35033 Hypertensive retinopathy, bilateral: Secondary | ICD-10-CM

## 2020-09-02 MED ORDER — PREDNISOLONE ACETATE 1 % OP SUSP
1.0000 [drp] | Freq: Four times a day (QID) | OPHTHALMIC | 0 refills | Status: AC
Start: 2020-09-02 — End: 2020-09-09

## 2020-09-02 MED ORDER — BEVACIZUMAB CHEMO INJECTION 1.25MG/0.05ML SYRINGE FOR KALEIDOSCOPE
1.2500 mg | INTRAVITREAL | Status: AC | PRN
  Administered 2020-09-02: 1.25 mg via INTRAVITREAL

## 2020-09-07 ENCOUNTER — Other Ambulatory Visit: Payer: Self-pay

## 2020-09-07 ENCOUNTER — Ambulatory Visit (INDEPENDENT_AMBULATORY_CARE_PROVIDER_SITE_OTHER): Payer: Medicaid Other | Admitting: Obstetrics & Gynecology

## 2020-09-07 ENCOUNTER — Other Ambulatory Visit (HOSPITAL_COMMUNITY)
Admission: RE | Admit: 2020-09-07 | Discharge: 2020-09-07 | Disposition: A | Discharge status: Home / Self Care | Payer: Medicaid Other | Source: Ambulatory Visit | Attending: Obstetrics & Gynecology | Admitting: Obstetrics & Gynecology

## 2020-09-07 ENCOUNTER — Encounter: Payer: Self-pay | Admitting: Obstetrics & Gynecology

## 2020-09-07 VITALS — BP 172/99 | HR 90 | Wt 130.0 lb

## 2020-09-07 DIAGNOSIS — N938 Other specified abnormal uterine and vaginal bleeding: Secondary | ICD-10-CM | POA: Insufficient documentation

## 2020-09-07 DIAGNOSIS — Z113 Encounter for screening for infections with a predominantly sexual mode of transmission: Secondary | ICD-10-CM | POA: Insufficient documentation

## 2020-09-07 NOTE — Progress Notes (Signed)
Patient presents for AUB.  LMP: 09/02/20 usual lasting 7 days Mod-Heavy flow.  Pt states she has been bleeding in between cycles. Also notes vaginal discharge no odor and cramping that is bearable.  Pt was told by PCP her having kidney disease could be causing irregular bleeding. Pt B/P elevated pt notes not taking Rx today.  Pt wants STD screening today Full Panel pt also wants HSV screening recent exposure.
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
## 2020-09-07 NOTE — Patient Instructions (Signed)
Safe Sex Practicing safe sex means taking steps before and during sex to reduce your risk of:  Getting an STI (sexually transmitted infection).  Giving your partner an STI.  Unwanted or unplanned pregnancy. How can I practice safe sex?     Ways you can practice safe sex  Limit your sexual partners to only one partner who is having sex with only you.  Avoid using alcohol and drugs before having sex. Alcohol and drugs can affect your judgment.  Before having sex with a new partner: ? Talk to your partner about past partners, past STIs, and drug use. ? Get screened for STIs and discuss the results with your partner. Ask your partner to get screened, too.  Check your body regularly for sores, blisters, rashes, or unusual discharge. If you notice any of these problems, visit your health care provider.  Avoid sexual contact if you have symptoms of an infection or you are being treated for an STI.  While having sex, use a condom. Make sure to: ? Use a condom every time you have vaginal, oral, or anal sex. Both females and males should wear condoms during oral sex. ? Keep condoms in place from the beginning to the end of sexual activity. ? Use a latex condom, if possible. Latex condoms offer the best protection. ? Use only water-based lubricants with a condom. Using petroleum-based lubricants or oils will weaken the condom and increase the chance that it will break. Ways your health care provider can help you practice safe sex  See your health care provider for regular screenings, exams, and tests for STIs.  Talk with your health care provider about what kind of birth control (contraception) is best for you.  Get vaccinated against hepatitis B and human papillomavirus (HPV).  If you are at risk of being infected with HIV (human immunodeficiency virus), talk with your health care provider about taking a prescription medicine to prevent HIV infection. You are at risk for HIV if  you: ? Are a man who has sex with other men. ? Are sexually active with more than one partner. ? Take drugs by injection. ? Have a sex partner who has HIV. ? Have unprotected sex. ? Have sex with someone who has sex with both men and women. ? Have had an STI. Follow these instructions at home:  Take over-the-counter and prescription medicines as told by your health care provider.  Keep all follow-up visits as told by your health care provider. This is important. Where to find more information  Centers for Disease Control and Prevention: https://www.cdc.gov/std/prevention/default.htm  Planned Parenthood: https://www.plannedparenthood.org/  Office on Women's Health: https://www.womenshealth.gov/a-z-topics/sexually-transmitted-infections Summary  Practicing safe sex means taking steps before and during sex to reduce your risk of STIs, giving your partner STIs, and having an unwanted or unplanned pregnancy.  Before having sex with a new partner, talk to your partner about past partners, past STIs, and drug use.  Use a condom every time you have vaginal, oral, or anal sex. Both females and males should wear condoms during oral sex.  Check your body regularly for sores, blisters, rashes, or unusual discharge. If you notice any of these problems, visit your health care provider.  See your health care provider for regular screenings, exams, and tests for STIs. This information is not intended to replace advice given to you by your health care provider. Make sure you discuss any questions you have with your health care provider. Document Revised: 03/29/2019 Document Reviewed: 09/17/2018 Elsevier Patient Education    2020 Elsevier Inc.  

## 2020-09-08 LAB — HEPATITIS B SURFACE ANTIGEN: Hepatitis B Surface Ag: NEGATIVE

## 2020-09-08 LAB — HIV ANTIBODY (ROUTINE TESTING W REFLEX): HIV Screen 4th Generation wRfx: NONREACTIVE

## 2020-09-08 LAB — RPR: RPR Ser Ql: NONREACTIVE

## 2020-09-08 LAB — HEPATITIS C ANTIBODY: Hep C Virus Ab: <0.1 s/co ratio (ref 0.0–0.9)

## 2020-09-09 LAB — CERVICOVAGINAL ANCILLARY ONLY
Bacterial Vaginitis (gardnerella): POSITIVE — AB
Candida Glabrata: NEGATIVE
Candida Vaginitis: NEGATIVE
Chlamydia: NEGATIVE
Comment: NEGATIVE
Comment: NEGATIVE
Comment: NEGATIVE
Comment: NEGATIVE
Comment: NEGATIVE
Comment: NORMAL
Neisseria Gonorrhea: NEGATIVE
Trichomonas: NEGATIVE

## 2020-09-10 MED ORDER — METRONIDAZOLE 500 MG PO TABS: 500.0000 mg | ORAL_TABLET | Freq: Two times a day (BID) | ORAL | 0 refills | Status: DC

## 2020-09-10 NOTE — Addendum Note (Signed)
Addended by: Woodroe Mode on: 09/10/2020 08:26 AM   Modules accepted: Orders

## 2020-09-11 DIAGNOSIS — E109 Type 1 diabetes mellitus without complications: Secondary | ICD-10-CM | POA: Diagnosis not present

## 2020-09-22 DIAGNOSIS — N184 Chronic kidney disease, stage 4 (severe): Secondary | ICD-10-CM | POA: Diagnosis not present

## 2020-09-22 DIAGNOSIS — Z01810 Encounter for preprocedural cardiovascular examination: Secondary | ICD-10-CM | POA: Diagnosis not present

## 2020-09-22 DIAGNOSIS — E1022 Type 1 diabetes mellitus with diabetic chronic kidney disease: Secondary | ICD-10-CM | POA: Diagnosis not present

## 2020-09-28 ENCOUNTER — Other Ambulatory Visit: Payer: Self-pay

## 2020-09-28 DIAGNOSIS — N938 Other specified abnormal uterine and vaginal bleeding: Secondary | ICD-10-CM

## 2020-09-28 MED ORDER — METRONIDAZOLE 500 MG PO TABS: 500.0000 mg | ORAL_TABLET | Freq: Two times a day (BID) | ORAL | 0 refills | Status: DC

## 2020-09-28 NOTE — Progress Notes (Signed)
Rx sent in for patient with BV symptoms per protocol, pt made aware.

## 2020-09-30 ENCOUNTER — Encounter (INDEPENDENT_AMBULATORY_CARE_PROVIDER_SITE_OTHER): Payer: Medicaid Other | Admitting: Ophthalmology

## 2020-09-30 DIAGNOSIS — E103513 Type 1 diabetes mellitus with proliferative diabetic retinopathy with macular edema, bilateral: Secondary | ICD-10-CM

## 2020-09-30 DIAGNOSIS — H3581 Retinal edema: Secondary | ICD-10-CM

## 2020-09-30 DIAGNOSIS — H35033 Hypertensive retinopathy, bilateral: Secondary | ICD-10-CM

## 2020-09-30 DIAGNOSIS — H35713 Central serous chorioretinopathy, bilateral: Secondary | ICD-10-CM

## 2020-09-30 DIAGNOSIS — H269 Unspecified cataract: Secondary | ICD-10-CM

## 2020-09-30 DIAGNOSIS — I10 Essential (primary) hypertension: Secondary | ICD-10-CM

## 2020-10-12 DIAGNOSIS — N184 Chronic kidney disease, stage 4 (severe): Secondary | ICD-10-CM | POA: Diagnosis not present

## 2020-10-12 DIAGNOSIS — Z20822 Contact with and (suspected) exposure to covid-19: Secondary | ICD-10-CM | POA: Diagnosis not present

## 2020-10-12 DIAGNOSIS — Z01812 Encounter for preprocedural laboratory examination: Secondary | ICD-10-CM | POA: Diagnosis not present

## 2020-10-13 DIAGNOSIS — E109 Type 1 diabetes mellitus without complications: Secondary | ICD-10-CM | POA: Diagnosis not present

## 2020-10-15 ENCOUNTER — Other Ambulatory Visit: Payer: Self-pay | Admitting: Internal Medicine

## 2020-10-15 DIAGNOSIS — I10 Essential (primary) hypertension: Secondary | ICD-10-CM

## 2020-10-16 ENCOUNTER — Other Ambulatory Visit: Payer: Self-pay | Admitting: *Deleted

## 2020-10-16 ENCOUNTER — Other Ambulatory Visit: Payer: Self-pay

## 2020-10-16 NOTE — Patient Instructions (Signed)
Visit Information  Thank you for taking the time to talk with me today. I will send a video on diabetic diet to your email address. You will have to enter the code that is sent to see the video. I will follow up as we discussed.  [name] was given information about Medicaid Managed Care team care coordination services as a part of their Healthy [facility] Medicaid benefit. [name] verbally consented to engagement with the [facility] Managed Care team.   For questions related to your Healthy [facility] health plan, please call: [phone] or visit the homepage here: [email or web address]  If you would like to schedule transportation through your Healthy [facility] plan, please call the following number at least 2 days in advance of your appointment: [phone]  Goals Addressed              This Visit's Progress   .  "I want to take better care of myself" (pt-stated)        CARE PLAN ENTRY Medicaid Managed Care (see longitudinal plan of care for additional care plan information)  Current Barriers:  . Chronic Disease Management support and education needs related to Diabetes, Hypertension and Kidney disease. Patient reports that she would like to start taking better care of herself.  . Non-adherence to prescribed medication regimen. Patient reports that she tries to take her medications as prescribed, however she often forgets to take them. Needs help organizing her medications to help her remember to take them regularly.  Nurse Case Manager Clinical Goal(s):  [name] Over the next 30 days, patient will verbalize understanding of plan for Diabetes, hypertension and renal disease . Over the next 30 days, patient will meet with RN Care Manager to address monitoring blood pressure and eating appropriately at home . Over the next 60 days, patient will verbalize basic understanding of Diabetes, hypertension and renal disease process and self health  management plan as evidenced by checking blood pressure at home, daily weights and eating a heart healthy, diabetic diet. . Over the next 30 days, patient will work with CM team pharmacist to review medications  Interventions:  . Inter-disciplinary care team collaboration (see longitudinal plan of care) . Evaluation of current treatment plan related to diabetes, hypertension and renal disease and patient's adherence to plan as established by provider. . Advised patient to schedule appointments with PCP and endocrinology, create a [employer] account and request a medication kit with lock box and pill box, a BP monitor and scale. . Provided education to patient re: heart healthy and diabetic diet . Reviewed medications with patient and discussed taking medications as prescribed, setting alarms at certain times of the day medication adherence . Reviewed scheduled/upcoming provider appointments including:10/19/20 for fistula surgery . Pharmacy referral for medication review  Plan:  . Patient will request resources from [facility] to assist with health management, schedule appointments with PCP and endocrinology . RNCM will send education material on diabetic diet and heart healthy diet, follow up on 11/16/20 @ 9am with a telephone visit   Initial goal documentation        Please see education materials related to diet provided by MyChart link. and by e-mail link.    Heart-Healthy Eating Plan Heart-healthy meal planning includes:  Eating less unhealthy fats.  Eating more healthy fats.  Making other changes in your diet. Talk with your doctor or a diet specialist (dietitian) to create an eating plan that is right for you.  What are tips for following this  plan? Cooking Avoid frying your food. Try to bake, boil, grill, or broil it instead. You can also reduce fat by:  Removing the skin from poultry.  Removing all visible fats from meats.  Steaming vegetables in water or  broth. Meal planning   At meals, divide your plate into four equal parts: ? Fill one-half of your plate with vegetables and green salads. ? Fill one-fourth of your plate with whole grains. ? Fill one-fourth of your plate with lean protein foods.  Eat 4-5 servings of vegetables per day. A serving of vegetables is: ? 1 cup of raw or cooked vegetables. ? 2 cups of raw leafy greens.  Eat 4-5 servings of fruit per day. A serving of fruit is: ? 1 medium whole fruit. ?  cup of dried fruit. ?  cup of fresh, frozen, or canned fruit. ?  cup of 100% fruit juice.  Eat more foods that have soluble fiber. These are apples, broccoli, carrots, beans, peas, and barley. Try to get 20-30 g of fiber per day.  Eat 4-5 servings of nuts, legumes, and seeds per week: ? 1 serving of dried beans or legumes equals  cup after being cooked. ? 1 serving of nuts is  cup. ? 1 serving of seeds equals 1 tablespoon. General information  Eat more home-cooked food. Eat less restaurant, buffet, and fast food.  Limit or avoid alcohol.  Limit foods that are high in starch and sugar.  Avoid fried foods.  Lose weight if you are overweight.  Keep track of how much salt (sodium) you eat. This is important if you have high blood pressure. Ask your doctor to tell you more about this.  Try to add vegetarian meals each week. Fats  Choose healthy fats. These include olive oil and canola oil, flaxseeds, walnuts, almonds, and seeds.  Eat more omega-3 fats. These include salmon, mackerel, sardines, tuna, flaxseed oil, and ground flaxseeds. Try to eat fish at least 2 times each week.  Check food labels. Avoid foods with trans fats or high amounts of saturated fat.  Limit saturated fats. ? These are often found in animal products, such as meats, butter, and cream. ? These are also found in plant foods, such as palm oil, palm kernel oil, and coconut oil.  Avoid foods with partially hydrogenated oils in them. These  have trans fats. Examples are stick margarine, some tub margarines, cookies, crackers, and other baked goods. What foods can I eat? Fruits All fresh, canned (in natural juice), or frozen fruits. Vegetables Fresh or frozen vegetables (raw, steamed, roasted, or grilled). Green salads. Grains Most grains. Choose whole wheat and whole grains most of the time. Rice and pasta, including brown rice and pastas made with whole wheat. Meats and other proteins Lean, well-trimmed beef, veal, pork, and lamb. Chicken and [place] without skin. All fish and shellfish. Wild duck, rabbit, pheasant, and venison. Egg whites or low-cholesterol egg substitutes. Dried beans, peas, lentils, and tofu. Seeds and most nuts. Dairy Low-fat or nonfat cheeses, including ricotta and mozzarella. Skim or 1% milk that is liquid, powdered, or evaporated. Buttermilk that is made with low-fat milk. Nonfat or low-fat yogurt. Fats and oils Non-hydrogenated (trans-free) margarines. Vegetable oils, including soybean, sesame, sunflower, olive, peanut, safflower, corn, canola, and cottonseed. Salad dressings or mayonnaise made with a vegetable oil. Beverages Mineral water. Coffee and tea. Diet carbonated beverages. Sweets and desserts Sherbet, gelatin, and fruit ice. Small amounts of dark chocolate. Limit all sweets and desserts. Seasonings and condiments All  seasonings and condiments. The items listed above may not be a complete list of foods and drinks you can eat. Contact a dietitian for more options. What foods should I avoid? Fruits Canned fruit in heavy syrup. Fruit in cream or butter sauce. Fried fruit. Limit coconut. Vegetables Vegetables cooked in cheese, cream, or butter sauce. Fried vegetables. Grains Breads that are made with saturated or trans fats, oils, or whole milk. Croissants. Sweet rolls. Donuts. High-fat crackers, such as cheese crackers. Meats and other proteins Fatty meats, such as hot dogs, ribs, sausage,  bacon, rib-eye roast or steak. High-fat deli meats, such as salami and bologna. Caviar. Domestic duck and goose. Organ meats, such as liver. Dairy Cream, sour cream, cream cheese, and creamed cottage cheese. Whole-milk cheeses. Whole or 2% milk that is liquid, evaporated, or condensed. Whole buttermilk. Cream sauce or high-fat cheese sauce. Yogurt that is made from whole milk. Fats and oils Meat fat, or shortening. Cocoa butter, hydrogenated oils, palm oil, coconut oil, palm kernel oil. Solid fats and shortenings, including bacon fat, salt pork, lard, and butter. Nondairy cream substitutes. Salad dressings with cheese or sour cream. Beverages Regular sodas and juice drinks with added sugar. Sweets and desserts Frosting. Pudding. Cookies. Cakes. Pies. Milk chocolate or white chocolate. Buttered syrups. Full-fat ice cream or ice cream drinks. The items listed above may not be a complete list of foods and drinks to avoid. Contact a dietitian for more information. Summary  Heart-healthy meal planning includes eating less unhealthy fats, eating more healthy fats, and making other changes in your diet.  Eat a balanced diet. This includes fruits and vegetables, low-fat or nonfat dairy, lean protein, nuts and legumes, whole grains, and heart-healthy oils and fats. This information is not intended to replace advice given to you by your health care provider. Make sure you discuss any questions you have with your health care provider. Document Revised: 02/08/2018 Document Reviewed: 01/12/2018 Elsevier Patient Education  2020 [employer].   Patient verbalizes understanding of instructions provided today.   The patient has been provided with contact information for the Managed Medicaid care management team and has been advised to call with any health related questions or concerns.  Telephone follow up appointment with Managed Medicaid care management team member scheduled for:11/16/20 @ [place] RN, [place] RN Care Coordinator

## 2020-10-16 NOTE — Patient Outreach (Addendum)
Care Coordination - Case Manager  10/16/2020  Angela [name] June 23, 1986 [id number]  Subjective:  Angela [name] is an 34 y.o. year old female who is [name] primary patient of Angela Pier, [name].  Angela [name] was given information about Medicaid Managed Care team care coordination services today. Angela [name] agreed to services and verbal consent obtained  Review of patient status, laboratory and other test data was performed as part of evaluation for provision of services.  SDOH: SDOH Screenings   Alcohol Screen:   . Last Alcohol Screening Score (AUDIT): Not on file  Depression (PHQ2-9): Medium Risk  . PHQ-2 Score: 17  Financial Resource Strain: Low Risk   . Difficulty of Paying Living Expenses: Not hard at all  Food Insecurity: No Food Insecurity  . Worried About Charity fundraiser in the Last Year: Never true  . Ran Out of Food in the Last Year: Never true  Housing: Low Risk   . Last Housing Risk Score: 0  Physical Activity: Inactive  . Days of Exercise per Week: 0 days  . Minutes of Exercise per Session: 0 min  Tobacco Use: Medium Risk  . Smoking Tobacco Use: Former Smoker  . Smokeless Tobacco Use: Never Used  Transportation Needs: No Transportation Needs  . Lack of Transportation (Medical): No  . Lack of Transportation (Non-Medical): No   SDOH Interventions     Most Recent Value  SDOH Interventions  Food Insecurity Interventions Intervention Not Indicated  Housing Interventions Intervention Not Indicated  Transportation Interventions Intervention Not Indicated      Objective:    Allergies  Allergen Reactions  . Ferumoxytol Itching  . Lisinopril Other (See Comments)    hyperkalemia  . Sulfamethoxazole Hives and Itching  . Trimethoprim Hives    Medications:    Medications Reviewed Today    Reviewed by Angela Montane, RN (Registered Nurse) on 10/16/20 at 1047  Med List Status: <None>  Medication Order Taking? Sig Documenting Provider Last Dose  Status Informant  acetaminophen (TYLENOL) 325 MG tablet [id number] Yes Take 650 mg by mouth every 6 (six) hours as needed for mild pain, moderate pain, fever or headache. Provider, Historical, [name] Taking Active Self  albuterol (PROVENTIL HFA;VENTOLIN HFA) 108 (90 Base) MCG/ACT inhaler [id number] Yes Inhale 2 puffs into the lungs every 6 (six) hours as needed for wheezing or shortness of breath. Angela Pier, [name] Taking Active Self  amLODipine (NORVASC) 10 MG tablet [id number] Yes Take 1 tablet (10 mg total) by mouth daily. Angela Pier, [name] Taking Active Self           Med Note Angela [name]   Thu Jun 25, 2020  3:09 AM)    amoxicillin-clavulanate (AUGMENTIN) 875-125 MG tablet [id number] No Take 1 tablet by mouth every 12 (twelve) hours.  Patient not taking: Reported on 10/16/2020   Angela [name] Not Taking Active Self  azelastine (ASTELIN) 0.1 % nasal spray [id number] Yes Place into the nose. Provider, Historical, [name] Taking Active   benzonatate (TESSALON PERLES) 100 MG capsule [id number] No Take 1 capsule (100 mg total) by mouth 3 (three) times daily as needed for cough.  Patient not taking: Reported on 10/16/2020   Angela Pier, [name] Not Taking Active   Blood Pressure Monitor DEVI [id number] No Use as directed to check home blood pressure 2-3 times [name] week  Patient not taking: Reported on 10/16/2020   Angela Pier, [name] Not Taking Active Self  busPIRone (BUSPAR) 15 MG tablet [id number]  Yes Take 1 tablet (15 mg total) by mouth 2 (two) times daily. Angela [name] Taking Active Self  Continuous Blood Gluc Sensor (DEXCOM G6 SENSOR) MISC [id number] Yes SMARTSIG:1 Each Topical Every 10 Days Provider, Historical, [name] Taking Active   cyclobenzaprine (FLEXERIL) 10 MG tablet [id number] No Take 1 tablet (10 mg total) by mouth 3 (three) times daily as needed for muscle spasms.  Patient not taking: Reported on 10/16/2020   Angela [name] Not Taking Active Self           Med  Note Angela [name]   Thu Jun 25, 2020  3:11 AM) Has not started  diltiazem (CARTIA XT) 240 MG 24 hr capsule [id number] Yes Take 1 capsule (240 mg total) by mouth daily. Angela Pier, [name] Taking Active Self  escitalopram (LEXAPRO) 20 MG tablet [id number] Yes Take 1 tablet (20 mg total) by mouth daily. Angela [name] Taking Active Self  fluticasone [facility]) 50 MCG/ACT nasal spray [id number] No Place into the nose.  Patient not taking: Reported on 10/16/2020   Provider, Historical, [name] Not Taking Active   furosemide (LASIX) 40 MG tablet [id number] Yes Take 40 mg by mouth daily.  Provider, Historical, [name] Taking Active Self  gabapentin (NEURONTIN) 300 MG capsule [id number] Yes Take 2 capsules (600 mg total) by mouth 2 (two) times daily. Angela Pier, [name] Taking Active Self  Galcanezumab-gnlm [facility]) 120 MG/ML [name] [id number] Yes Inject 120 mg into the skin every 30 (thirty) days. Angela Beam, [name] Taking Active Self           Med Note Angela [name] Jun 25, 2020  3:11 AM) Last inj 06/18/20  hydrALAZINE (APRESOLINE) 50 MG tablet [id number] Yes Take 1.5 tablets (75 mg total) by mouth 3 (three) times daily.  Patient taking differently: Take 100 mg by mouth 3 (three) times daily.    Angela Pier, [name] Taking Active Self           Med Note Angela [name], Angela [name]   Fri Oct 16, 2020 10:24 AM) Taking twice daily  HYDROcodone-acetaminophen (NORCO/VICODIN) 5-325 MG tablet [id number] No Take 1 tablet by mouth every 6 (six) hours as needed.  Patient not taking: Reported on 10/16/2020   Provider, Historical, [name] Not Taking Active   insulin aspart (NOVOLOG) 100 UNIT/ML injection [id number] Yes USE AS DIRECTED IN INSULIN PUMP.  Patient taking differently: Inject 3-12 Units into the skin See admin instructions. USE AS DIRECTED IN INSULIN PUMP. When pump is stopped will use sliding scale   Angela Pier, [name] Taking Active Self           Med Note Synthia Innocent   Thu Jun 25, 2020  3:09 AM)  Pt did 3 units 06/24/20 in the am  Insulin Degludec (TRESIBA) 100 UNIT/ML SOLN [id number] Yes Inject 22 Units into the skin daily. Angela Pier, [name] Taking Active Self  Insulin Pen Needle 32G X 4 MM MISC [id number] Yes Used to give insulin injections four times daily. [name], [name] Taking Active Self  Insulin Syringe-Needle U-100 (BD INSULIN SYRINGE ULTRAFINE) 31G X 15/64" 1 ML MISC [id number] Yes Used to give daily insulin injections. [name], [name] Taking Active Self  levothyroxine (SYNTHROID) 200 MCG tablet [id number] Yes TAKE 1 TABLET BY MOUTH DAILY BEFORE BREAKFAST  Patient taking differently: Take 200 mcg by mouth daily before breakfast.    Angela Pier, [name] Taking Active Self  loratadine (CLARITIN) 10 MG tablet [id number] Yes  1 tab every 24-48 hours PRN  Patient taking differently: Take 10 mg by mouth daily as needed for allergies.    Angela Pier, [name] Taking Active Self  losartan (COZAAR) 50 MG tablet [id number] Yes Take 50 mg by mouth daily.  Provider, Historical, [name] Taking Active Self       Patient not taking:      Discontinued 06/25/20 0424   metroNIDAZOLE (FLAGYL) 500 MG tablet [id number] No Take 1 tablet (500 mg total) by mouth 2 (two) times daily.  Patient not taking: Reported on 10/16/2020   Woodroe Mode, [name] Not Taking Active            Med Note Angela [name], Slayde Brault [name]   Fri Oct 16, 2020 10:25 AM) completed  ondansetron (ZOFRAN) 4 MG tablet [id number] Yes Take 1 tablet (4 mg total) by mouth daily as needed for nausea or vomiting. Angela Pier, [name] Taking Active   ondansetron (ZOFRAN-ODT) 4 MG disintegrating tablet [id number] Yes Take 4 mg by mouth every 8 (eight) hours as needed. Provider, Historical, [name] Taking Active        Patient not taking:      Discontinued 06/25/20 0425   pravastatin (PRAVACHOL) 20 MG tablet [id number] Yes Take 1 tablet (20 mg total) by mouth daily. Angela Pier, [name] Taking Active Self  Rimegepant Sulfate (NURTEC) 75 MG TBDP [id number] Yes  Take 75 mg by mouth daily as needed. For migraines. Take as close to onset of migraine as possible. One daily maximum. Angela Beam, [name] Taking Active Self  sodium bicarbonate 650 MG tablet [id number] Yes Take 1 tablet (650 mg total) by mouth 2 (two) times daily. Angela Pier, [name] Taking Active Self          Assessment:   Goals Addressed              This Visit's Progress   .  "I want to take better care of myself" (pt-stated)        [place] (see longitudinal plan of care for additional care plan information)  Current Barriers:  . Chronic Disease Management support and education needs related to Diabetes, Hypertension and Kidney disease. Patient reports that she would like to start taking better care of herself.  . Non-adherence to prescribed medication regimen. Patient reports that she tries to take her medications as prescribed, however she often forgets to take them. Needs help organizing her medications to help her remember to take them regularly.  Nurse Case Manager Clinical Goal(s):  [name] Over the next 30 days, patient will verbalize understanding of plan for Diabetes, hypertension and renal disease . Over the next 30 days, patient will meet with RN Care Manager to address monitoring blood pressure and eating appropriately at home . Over the next 60 days, patient will verbalize basic understanding of Diabetes, hypertension and renal disease process and self health management plan as evidenced by checking blood pressure at home, daily weights and eating [name] heart healthy, diabetic diet. . Over the next 30 days, patient will work with CM team pharmacist to review medications  Interventions:  . Inter-disciplinary care team collaboration (see longitudinal plan of care) . Evaluation of current treatment plan related to diabetes, hypertension and renal disease and patient's adherence to plan as established by provider. . Advised patient to schedule  appointments with PCP and endocrinology, create [name] Healthy Blue account and request [name] medication kit with lock box and pill box, [name] BP monitor and  scale. . Provided education to patient re: heart healthy and diabetic diet . Reviewed medications with patient and discussed taking medications as prescribed, setting alarms at certain times of the day medication adherence . Reviewed scheduled/upcoming provider appointments including:10/19/20 for fistula surgery . Pharmacy referral for medication review  Plan:  . Patient will request resources from [facility] to assist with health management, schedule appointments with PCP and endocrinology . RNCM will send education material on diabetic diet and heart healthy diet, follow up on 11/16/20 @ 9am with [name] telephone visit   Initial goal documentation        Plan: RNCM will follow up with [name] telephone visit on 11/16/20 @ [place], [place] RN Care Coordinator

## 2020-10-19 ENCOUNTER — Encounter: Payer: Self-pay | Admitting: *Deleted

## 2020-10-19 DIAGNOSIS — N185 Chronic kidney disease, stage 5: Secondary | ICD-10-CM | POA: Diagnosis not present

## 2020-10-19 DIAGNOSIS — N186 End stage renal disease: Secondary | ICD-10-CM | POA: Diagnosis not present

## 2020-10-20 ENCOUNTER — Other Ambulatory Visit: Payer: Self-pay | Admitting: Internal Medicine

## 2020-10-20 DIAGNOSIS — I10 Essential (primary) hypertension: Secondary | ICD-10-CM

## 2020-10-23 ENCOUNTER — Encounter (INDEPENDENT_AMBULATORY_CARE_PROVIDER_SITE_OTHER): Payer: Medicaid Other | Admitting: Ophthalmology

## 2020-10-23 ENCOUNTER — Other Ambulatory Visit: Payer: Self-pay

## 2020-10-23 DIAGNOSIS — I10 Essential (primary) hypertension: Secondary | ICD-10-CM

## 2020-10-23 DIAGNOSIS — H269 Unspecified cataract: Secondary | ICD-10-CM

## 2020-10-23 DIAGNOSIS — H35713 Central serous chorioretinopathy, bilateral: Secondary | ICD-10-CM

## 2020-10-23 DIAGNOSIS — H35033 Hypertensive retinopathy, bilateral: Secondary | ICD-10-CM

## 2020-10-23 DIAGNOSIS — E103513 Type 1 diabetes mellitus with proliferative diabetic retinopathy with macular edema, bilateral: Secondary | ICD-10-CM

## 2020-10-23 DIAGNOSIS — H3581 Retinal edema: Secondary | ICD-10-CM

## 2020-11-03 DIAGNOSIS — D631 Anemia in chronic kidney disease: Secondary | ICD-10-CM | POA: Diagnosis not present

## 2020-11-03 DIAGNOSIS — I120 Hypertensive chronic kidney disease with stage 5 chronic kidney disease or end stage renal disease: Secondary | ICD-10-CM | POA: Diagnosis not present

## 2020-11-03 DIAGNOSIS — I129 Hypertensive chronic kidney disease with stage 1 through stage 4 chronic kidney disease, or unspecified chronic kidney disease: Secondary | ICD-10-CM | POA: Diagnosis not present

## 2020-11-03 DIAGNOSIS — Z79890 Hormone replacement therapy (postmenopausal): Secondary | ICD-10-CM | POA: Diagnosis not present

## 2020-11-03 DIAGNOSIS — N2581 Secondary hyperparathyroidism of renal origin: Secondary | ICD-10-CM | POA: Diagnosis not present

## 2020-11-03 DIAGNOSIS — E1022 Type 1 diabetes mellitus with diabetic chronic kidney disease: Secondary | ICD-10-CM | POA: Diagnosis not present

## 2020-11-03 DIAGNOSIS — N185 Chronic kidney disease, stage 5: Secondary | ICD-10-CM | POA: Diagnosis not present

## 2020-11-03 DIAGNOSIS — E039 Hypothyroidism, unspecified: Secondary | ICD-10-CM | POA: Diagnosis not present

## 2020-11-03 DIAGNOSIS — Z79899 Other long term (current) drug therapy: Secondary | ICD-10-CM | POA: Diagnosis not present

## 2020-11-04 ENCOUNTER — Telehealth: Payer: Self-pay | Admitting: Internal Medicine

## 2020-11-04 NOTE — Telephone Encounter (Signed)
Attempted to reach [name] to schedule her a phone visit with the Managed Medicaid Pharmacist. I left my name and phone number for her to call me back. I will make another attempt to call patient if I don't hear back from her.

## 2020-11-06 ENCOUNTER — Encounter: Payer: Self-pay | Admitting: Internal Medicine

## 2020-11-10 ENCOUNTER — Encounter: Payer: Self-pay | Admitting: Internal Medicine

## 2020-11-10 ENCOUNTER — Ambulatory Visit: Payer: Medicaid Other | Attending: Internal Medicine | Admitting: Internal Medicine

## 2020-11-10 ENCOUNTER — Other Ambulatory Visit: Payer: Self-pay

## 2020-11-10 VITALS — BP 184/107 | HR 91 | Resp 16 | Wt 132.4 lb

## 2020-11-10 DIAGNOSIS — E1029 Type 1 diabetes mellitus with other diabetic kidney complication: Secondary | ICD-10-CM | POA: Diagnosis not present

## 2020-11-10 LAB — POCT GLYCOSYLATED HEMOGLOBIN (HGB A1C): HbA1c, POC (controlled diabetic range): 10.4 % — AB (ref 0.0–7.0)

## 2020-11-10 LAB — GLUCOSE, POCT (MANUAL RESULT ENTRY)
POC Glucose: 56 mg/dl — AB (ref 70–99)
POC Glucose: 119 mg/dl — AB (ref 70–99)

## 2020-11-10 MED ORDER — LEVOTHYROXINE SODIUM 175 MCG PO TABS
ORAL_TABLET | ORAL | 3 refills | Status: DC
Start: 2020-11-10 — End: 2020-12-30

## 2020-11-10 MED ORDER — ONDANSETRON 4 MG PO TBDP
4.0000 mg | ORAL_TABLET | Freq: Three times a day (TID) | ORAL | 1 refills | Status: DC | PRN
Start: 2020-11-10 — End: 2021-03-03

## 2020-11-10 NOTE — Addendum Note (Signed)
Addended by: Jackelyn Knife on: 11/10/2020 03:55 PM   Modules accepted: Orders

## 2020-11-10 NOTE — Assessment & Plan Note (Signed)
Very complicated patient with multiple physicians.  She has f/u with endocrine and nephrology within the next three weeks. I will not make changes to DM or BP meds (repeat BP 155/85).   Dm gastroparesis- refill ondansetron.
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
## 2020-11-11 ENCOUNTER — Other Ambulatory Visit: Payer: Self-pay | Admitting: Internal Medicine

## 2020-11-11 DIAGNOSIS — E1029 Type 1 diabetes mellitus with other diabetic kidney complication: Secondary | ICD-10-CM

## 2020-11-16 ENCOUNTER — Other Ambulatory Visit: Payer: Self-pay

## 2020-11-16 ENCOUNTER — Encounter (HOSPITAL_COMMUNITY): Payer: Self-pay | Admitting: Emergency Medicine

## 2020-11-16 ENCOUNTER — Observation Stay (HOSPITAL_COMMUNITY)
Admission: EM | Admit: 2020-11-16 | Discharge: 2020-11-16 | Disposition: A | Discharge status: Home / Self Care | Payer: Medicaid Other | Attending: Internal Medicine | Admitting: Internal Medicine

## 2020-11-16 ENCOUNTER — Ambulatory Visit: Payer: Self-pay

## 2020-11-16 DIAGNOSIS — J45909 Unspecified asthma, uncomplicated: Secondary | ICD-10-CM | POA: Diagnosis not present

## 2020-11-16 DIAGNOSIS — Z87891 Personal history of nicotine dependence: Secondary | ICD-10-CM | POA: Insufficient documentation

## 2020-11-16 DIAGNOSIS — Z794 Long term (current) use of insulin: Secondary | ICD-10-CM | POA: Diagnosis not present

## 2020-11-16 DIAGNOSIS — K226 Gastro-esophageal laceration-hemorrhage syndrome: Secondary | ICD-10-CM | POA: Diagnosis present

## 2020-11-16 DIAGNOSIS — N170 Acute kidney failure with tubular necrosis: Secondary | ICD-10-CM | POA: Diagnosis not present

## 2020-11-16 DIAGNOSIS — Z20822 Contact with and (suspected) exposure to covid-19: Secondary | ICD-10-CM | POA: Diagnosis not present

## 2020-11-16 DIAGNOSIS — N189 Chronic kidney disease, unspecified: Secondary | ICD-10-CM

## 2020-11-16 DIAGNOSIS — N185 Chronic kidney disease, stage 5: Secondary | ICD-10-CM | POA: Diagnosis not present

## 2020-11-16 DIAGNOSIS — I120 Hypertensive chronic kidney disease with stage 5 chronic kidney disease or end stage renal disease: Secondary | ICD-10-CM | POA: Insufficient documentation

## 2020-11-16 DIAGNOSIS — N179 Acute kidney failure, unspecified: Secondary | ICD-10-CM | POA: Diagnosis not present

## 2020-11-16 DIAGNOSIS — I10 Essential (primary) hypertension: Secondary | ICD-10-CM | POA: Diagnosis not present

## 2020-11-16 DIAGNOSIS — E162 Hypoglycemia, unspecified: Secondary | ICD-10-CM

## 2020-11-16 DIAGNOSIS — E1042 Type 1 diabetes mellitus with diabetic polyneuropathy: Secondary | ICD-10-CM | POA: Diagnosis not present

## 2020-11-16 DIAGNOSIS — I129 Hypertensive chronic kidney disease with stage 1 through stage 4 chronic kidney disease, or unspecified chronic kidney disease: Secondary | ICD-10-CM | POA: Diagnosis not present

## 2020-11-16 DIAGNOSIS — E782 Mixed hyperlipidemia: Secondary | ICD-10-CM

## 2020-11-16 DIAGNOSIS — I160 Hypertensive urgency: Secondary | ICD-10-CM | POA: Diagnosis not present

## 2020-11-16 DIAGNOSIS — E1022 Type 1 diabetes mellitus with diabetic chronic kidney disease: Secondary | ICD-10-CM | POA: Insufficient documentation

## 2020-11-16 DIAGNOSIS — E039 Hypothyroidism, unspecified: Secondary | ICD-10-CM | POA: Diagnosis not present

## 2020-11-16 DIAGNOSIS — Z79899 Other long term (current) drug therapy: Secondary | ICD-10-CM | POA: Insufficient documentation

## 2020-11-16 DIAGNOSIS — R112 Nausea with vomiting, unspecified: Secondary | ICD-10-CM | POA: Diagnosis present

## 2020-11-16 DIAGNOSIS — D631 Anemia in chronic kidney disease: Secondary | ICD-10-CM | POA: Diagnosis not present

## 2020-11-16 DIAGNOSIS — E10649 Type 1 diabetes mellitus with hypoglycemia without coma: Secondary | ICD-10-CM | POA: Diagnosis present

## 2020-11-16 DIAGNOSIS — N184 Chronic kidney disease, stage 4 (severe): Secondary | ICD-10-CM | POA: Diagnosis not present

## 2020-11-16 LAB — URINALYSIS, ROUTINE W REFLEX MICROSCOPIC
Bacteria, UA: NONE SEEN
Bilirubin Urine: NEGATIVE
Glucose, UA: >=500 mg/dL — AB
Ketones, ur: NEGATIVE mg/dL
Leukocytes,Ua: NEGATIVE
Nitrite: NEGATIVE
Protein, ur: 100 mg/dL — AB
Specific Gravity, Urine: 1.008 (ref 1.005–1.030)
pH: 6 (ref 5.0–8.0)

## 2020-11-16 LAB — I-STAT VENOUS BLOOD GAS, ED
Acid-Base Excess: 0 mmol/L (ref 0.0–2.0)
Bicarbonate: 24.8 mmol/L (ref 20.0–28.0)
Calcium, Ion: 1.24 mmol/L (ref 1.15–1.40)
HCT: 32 % — ABNORMAL LOW (ref 36.0–46.0)
Hemoglobin: 10.9 g/dL — ABNORMAL LOW (ref 12.0–15.0)
O2 Saturation: 99 %
Potassium: 3.8 mmol/L (ref 3.5–5.1)
Sodium: 139 mmol/L (ref 135–145)
TCO2: 26 mmol/L (ref 22–32)
pCO2, Ven: 41 mmHg — ABNORMAL LOW (ref 44.0–60.0)
pH, Ven: 7.39 (ref 7.250–7.430)
pO2, Ven: 133 mmHg — ABNORMAL HIGH (ref 32.0–45.0)

## 2020-11-16 LAB — COMPREHENSIVE METABOLIC PANEL
ALT: 17 U/L (ref 0–44)
AST: 24 U/L (ref 15–41)
Albumin: 4 g/dL (ref 3.5–5.0)
Alkaline Phosphatase: 99 U/L (ref 38–126)
Anion gap: 11 (ref 5–15)
BUN: 37 mg/dL — ABNORMAL HIGH (ref 6–20)
CO2: 21 mmol/L — ABNORMAL LOW (ref 22–32)
Calcium: 9.9 mg/dL (ref 8.9–10.3)
Chloride: 105 mmol/L (ref 98–111)
Creatinine, Ser: 6.17 mg/dL — ABNORMAL HIGH (ref 0.44–1.00)
GFR, Estimated: 8 mL/min — ABNORMAL LOW (ref >60)
Glucose, Bld: 43 mg/dL — CL (ref 70–99)
Potassium: 3.8 mmol/L (ref 3.5–5.1)
Sodium: 137 mmol/L (ref 135–145)
Total Bilirubin: 0.5 mg/dL (ref 0.3–1.2)
Total Protein: 6.9 g/dL (ref 6.5–8.1)

## 2020-11-16 LAB — CBC
HCT: 32.3 % — ABNORMAL LOW (ref 36.0–46.0)
Hemoglobin: 9.7 g/dL — ABNORMAL LOW (ref 12.0–15.0)
MCH: 28 pg (ref 26.0–34.0)
MCHC: 30 g/dL (ref 30.0–36.0)
MCV: 93.4 fL (ref 80.0–100.0)
Platelets: 161 10*3/uL (ref 150–400)
RBC: 3.46 MIL/uL — ABNORMAL LOW (ref 3.87–5.11)
RDW: 13 % (ref 11.5–15.5)
WBC: 6.4 10*3/uL (ref 4.0–10.5)
nRBC: 0 % (ref 0.0–0.2)

## 2020-11-16 LAB — CBG MONITORING, ED
Glucose-Capillary: 41 mg/dL — CL (ref 70–99)
Glucose-Capillary: 52 mg/dL — ABNORMAL LOW (ref 70–99)
Glucose-Capillary: 61 mg/dL — ABNORMAL LOW (ref 70–99)
Glucose-Capillary: 69 mg/dL — ABNORMAL LOW (ref 70–99)
Glucose-Capillary: 129 mg/dL — ABNORMAL HIGH (ref 70–99)

## 2020-11-16 LAB — RESP PANEL BY RT-PCR (FLU A&B, COVID) ARPGX2
Influenza A by PCR: NEGATIVE
Influenza B by PCR: NEGATIVE
SARS Coronavirus 2 by RT PCR: NEGATIVE

## 2020-11-16 LAB — LIPASE, BLOOD: Lipase: 37 U/L (ref 11–51)

## 2020-11-16 MED ORDER — DEXTROSE 50 % IV SOLN
12.5000 g | Freq: Once | INTRAVENOUS | Status: AC
  Administered 2020-11-16: 12.5 g via INTRAVENOUS

## 2020-11-16 MED ORDER — RIMEGEPANT SULFATE 75 MG PO TBDP: 75.0000 mg | ORAL_TABLET | Freq: Every day | ORAL | Status: DC | PRN

## 2020-11-16 MED ORDER — BUSPIRONE HCL 10 MG PO TABS: 15.0000 mg | ORAL_TABLET | Freq: Two times a day (BID) | ORAL | Status: DC

## 2020-11-16 MED ORDER — DIPHENHYDRAMINE HCL 50 MG/ML IJ SOLN
25.0000 mg | Freq: Once | INTRAMUSCULAR | Status: AC
  Administered 2020-11-16: 25 mg via INTRAVENOUS
  Filled 2020-11-16: qty 1

## 2020-11-16 MED ORDER — SODIUM CHLORIDE 0.9 % IV SOLN: INTRAVENOUS | Status: DC

## 2020-11-16 MED ORDER — DILTIAZEM HCL ER COATED BEADS 240 MG PO CP24
240.0000 mg | ORAL_CAPSULE | Freq: Every day | ORAL | Status: DC
  Filled 2020-11-16: qty 1

## 2020-11-16 MED ORDER — GABAPENTIN 300 MG PO CAPS: 300.0000 mg | ORAL_CAPSULE | Freq: Every day | ORAL | Status: DC

## 2020-11-16 MED ORDER — HYDRALAZINE HCL 25 MG PO TABS
100.0000 mg | ORAL_TABLET | Freq: Three times a day (TID) | ORAL | Status: DC
  Administered 2020-11-16: 100 mg via ORAL
  Filled 2020-11-16: qty 4

## 2020-11-16 MED ORDER — PROCHLORPERAZINE EDISYLATE 10 MG/2ML IJ SOLN: 10.0000 mg | Freq: Four times a day (QID) | INTRAMUSCULAR | Status: DC | PRN

## 2020-11-16 MED ORDER — PANTOPRAZOLE SODIUM 40 MG IV SOLR: 40.0000 mg | INTRAVENOUS | Status: DC

## 2020-11-16 MED ORDER — PRAVASTATIN SODIUM 10 MG PO TABS: 20.0000 mg | ORAL_TABLET | Freq: Every day | ORAL | Status: DC

## 2020-11-16 MED ORDER — LABETALOL HCL 5 MG/ML IV SOLN: 20.0000 mg | INTRAVENOUS | Status: DC | PRN

## 2020-11-16 MED ORDER — LEVOTHYROXINE SODIUM 75 MCG PO TABS: 175.0000 ug | ORAL_TABLET | Freq: Every day | ORAL | Status: DC

## 2020-11-16 MED ORDER — LACTATED RINGERS IV BOLUS
1000.0000 mL | Freq: Once | INTRAVENOUS | Status: AC
  Administered 2020-11-16: 1000 mL via INTRAVENOUS

## 2020-11-16 MED ORDER — ACETAMINOPHEN 325 MG PO TABS: 650.0000 mg | ORAL_TABLET | Freq: Four times a day (QID) | ORAL | Status: DC | PRN

## 2020-11-16 MED ORDER — METOCLOPRAMIDE HCL 5 MG/ML IJ SOLN: 5.0000 mg | Freq: Three times a day (TID) | INTRAMUSCULAR | Status: DC

## 2020-11-16 MED ORDER — METOCLOPRAMIDE HCL 5 MG/ML IJ SOLN
10.0000 mg | Freq: Once | INTRAMUSCULAR | Status: AC
  Administered 2020-11-16: 10 mg via INTRAVENOUS
  Filled 2020-11-16: qty 2

## 2020-11-16 MED ORDER — INSULIN ASPART 100 UNIT/ML ~~LOC~~ SOLN: 0.0000 [IU] | Freq: Three times a day (TID) | SUBCUTANEOUS | Status: DC

## 2020-11-16 MED ORDER — SODIUM BICARBONATE 650 MG PO TABS
650.0000 mg | ORAL_TABLET | Freq: Two times a day (BID) | ORAL | Status: DC
  Filled 2020-11-16: qty 1

## 2020-11-16 MED ORDER — DILTIAZEM HCL ER COATED BEADS 240 MG PO CP24: 240.0000 mg | ORAL_CAPSULE | Freq: Every day | ORAL | Status: DC

## 2020-11-16 MED ORDER — ALBUTEROL SULFATE HFA 108 (90 BASE) MCG/ACT IN AERS: 2.0000 | INHALATION_SPRAY | Freq: Four times a day (QID) | RESPIRATORY_TRACT | Status: DC | PRN

## 2020-11-16 MED ORDER — INSULIN DEGLUDEC 100 UNIT/ML ~~LOC~~ SOLN: 22.0000 [IU] | Freq: Every day | SUBCUTANEOUS | Status: DC

## 2020-11-16 MED ORDER — ESCITALOPRAM OXALATE 10 MG PO TABS: 20.0000 mg | ORAL_TABLET | Freq: Every day | ORAL | Status: DC

## 2020-11-16 MED ORDER — INSULIN GLARGINE 100 UNIT/ML ~~LOC~~ SOLN
22.0000 [IU] | Freq: Every day | SUBCUTANEOUS | Status: DC
  Filled 2020-11-16: qty 0.22

## 2020-11-16 MED ORDER — HYDRALAZINE HCL 25 MG PO TABS: 100.0000 mg | ORAL_TABLET | Freq: Two times a day (BID) | ORAL | Status: DC

## 2020-11-16 MED ORDER — AMLODIPINE BESYLATE 5 MG PO TABS: 10.0000 mg | ORAL_TABLET | Freq: Every day | ORAL | Status: DC

## 2020-11-16 MED ORDER — ACETAMINOPHEN 650 MG RE SUPP: 650.0000 mg | Freq: Four times a day (QID) | RECTAL | Status: DC | PRN

## 2020-11-16 MED ORDER — HEPARIN SODIUM (PORCINE) 5000 UNIT/ML IJ SOLN: 5000.0000 [IU] | Freq: Three times a day (TID) | INTRAMUSCULAR | Status: DC

## 2020-11-16 NOTE — ED Notes (Signed)
Pt reports she last took insulin @ 11am for BS in 500's
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
## 2020-11-16 NOTE — ED Notes (Signed)
Pt is asking to go home, states that she feels better and could follow up with her MD

## 2020-11-16 NOTE — ED Triage Notes (Signed)
Pt presents with multiple complaints, dexcom off since Thursday, abd pain, pink tinged emesis, pt has hx of renal failure, graft in place but she has not started dialysis yet. Pt tearful and anxious in triage.
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
## 2020-11-16 NOTE — ED Notes (Signed)
Pt alert and oriented provided with oral juice. Repeat CBG 52

## 2020-11-19 DIAGNOSIS — T82858A Stenosis of vascular prosthetic devices, implants and grafts, initial encounter: Secondary | ICD-10-CM | POA: Diagnosis not present

## 2020-11-19 DIAGNOSIS — N184 Chronic kidney disease, stage 4 (severe): Secondary | ICD-10-CM | POA: Diagnosis not present

## 2020-11-19 DIAGNOSIS — N185 Chronic kidney disease, stage 5: Secondary | ICD-10-CM | POA: Diagnosis not present

## 2020-11-23 ENCOUNTER — Other Ambulatory Visit: Payer: Self-pay | Admitting: *Deleted

## 2020-11-23 ENCOUNTER — Other Ambulatory Visit: Payer: Self-pay

## 2020-11-23 NOTE — Patient Outreach (Signed)
Care Coordination - Case Manager  11/23/2020  Zahraa Bhargava 1986/07/13 [id number]  Subjective:  Angela [name] is an 34 y.o. year old female who is a primary patient of [name], MD.  Ms. Albee was given information about Medicaid Managed Care team care coordination services today. Angela [name] agreed to services and verbal consent obtained  Review of patient status, laboratory and other test data was performed as part of evaluation for provision of services.  SDOH: SDOH Screenings   Alcohol Screen:   . Last Alcohol Screening Score (AUDIT): Not on file  Depression (PHQ2-9): Medium Risk  . PHQ-2 Score: 9  Financial Resource Strain:   . Difficulty of Paying Living Expenses: Not on file  Food Insecurity: No Food Insecurity  . Worried About Charity fundraiser in the Last Year: Never true  . Ran Out of Food in the Last Year: Never true  Housing: Low Risk   . Last Housing Risk Score: 0  Tobacco Use: Medium Risk  . Smoking Tobacco Use: Former Smoker  . Smokeless Tobacco Use: Never Used  Transportation Needs: No Transportation Needs  . Lack of Transportation (Medical): No  . Lack of Transportation (Non-Medical): No     Objective:    Allergies  Allergen Reactions  . Ferumoxytol Itching  . Lisinopril Other (See Comments)    hyperkalemia  . Sulfamethoxazole Hives and Itching  . Trimethoprim Hives    Medications:    Medications Reviewed Today    Reviewed by Melissa Montane, RN (Registered Nurse) on 11/23/20 at [phone]  Med List Status: <None>  Medication Order Taking? Sig Documenting Provider Last Dose Status Informant  acetaminophen (TYLENOL) 325 MG tablet [id number] Yes Take 650 mg by mouth every 6 (six) hours as needed for mild pain, moderate pain, fever or headache. [provider] Taking Active Self  albuterol (PROVENTIL HFA;VENTOLIN HFA) 108 (90 Base) MCG/ACT inhaler [id number] Yes Inhale 2 puffs into the lungs every 6 (six) hours as needed for  wheezing or shortness of breath. [name], MD Taking Active Self  amLODipine (NORVASC) 10 MG tablet [id number] Yes Take 1 tablet (10 mg total) by mouth daily. Please make PCP appointment. [name], MD Taking Active   azelastine (ASTELIN) 0.1 % nasal spray [id number] Yes Place into the nose. [provider] Taking Active   Blood Pressure Monitor DEVI [id number]  Use as directed to check home blood pressure 2-3 times a week  Patient not taking: Reported on 10/16/2020   [name], MD  Active Self  busPIRone (BUSPAR) 15 MG tablet [id number] Yes Take 1 tablet (15 mg total) by mouth 2 (two) times daily. [name], MD Taking Active Self  Continuous Blood Gluc Sensor (DEXCOM G6 SENSOR) MISC [id number] Yes SMARTSIG:1 Each Topical Every 10 Days [provider] Taking Active   diltiazem (CARTIA XT) 240 MG 24 hr capsule [id number] Yes Take 1 capsule (240 mg total) by mouth daily. [name], MD Taking Active Self  escitalopram (LEXAPRO) 20 MG tablet [id number] Yes Take 1 tablet (20 mg total) by mouth daily. [name], MD Taking Active Self  furosemide (LASIX) 40 MG tablet [id number] Yes Take 40 mg by mouth daily.  [provider] Taking Active Self  gabapentin (NEURONTIN) 300 MG capsule [id number] Yes Take 2 capsules (600 mg total) by mouth 2 (two) times daily. [name], MD Taking Active Self  Galcanezumab-gnlm [facility]) 120 MG/ML [name] [id number]  Inject 120 mg into the skin every 30 (thirty)  days. Angela Beam, MD  Active Self           Med Note [name], Angela [name] A   Mon Nov 23, 2020  9:05 AM) Last inj 11/07/20  hydrALAZINE (APRESOLINE) 50 MG tablet [id number] Yes Take 1.5 tablets (75 mg total) by mouth 3 (three) times daily.  Patient taking differently: Take 100 mg by mouth 3 (three) times daily.    [name], MD Taking Active Self           Med Note [name], [name] A   Fri Oct 16, 2020 10:24 AM) Taking twice daily  insulin  aspart (NOVOLOG) 100 UNIT/ML injection [id number] Yes USE AS DIRECTED IN INSULIN PUMP [name], MD Taking Active   Insulin Degludec (TRESIBA) 100 UNIT/ML SOLN [id number] Yes Inject 22 Units into the skin daily. [name], MD Taking Active Self  Insulin Pen Needle 32G X 4 MM MISC [id number] Yes Used to give insulin injections four times daily. [name], MD Taking Active Self  Insulin Syringe-Needle U-100 (BD INSULIN SYRINGE ULTRAFINE) 31G X 15/64" 1 ML MISC [id number] Yes Used to give daily insulin injections. [name], MD Taking Active Self  levothyroxine (SYNTHROID) 175 MCG tablet [id number] Yes TAKE 1 TABLET BY MOUTH DAILY BEFORE BREAKFAST Swords, Angela Penna, MD Taking Active   loratadine (CLARITIN) 10 MG tablet [id number] Yes 1 tab every 24-48 hours PRN  Patient taking differently: Take 10 mg by mouth daily as needed for allergies.    [name], MD Taking Active Self  losartan (COZAAR) 50 MG tablet [id number] Yes Take 50 mg by mouth daily.  [provider] Taking Active Self       Patient not taking:      Discontinued 06/25/20 0424   ondansetron (ZOFRAN-ODT) 4 MG disintegrating tablet [id number] Yes Take 1 tablet (4 mg total) by mouth every 8 (eight) hours as needed. [name], MD Taking Active        Patient not taking:      Discontinued 06/25/20 0425   pravastatin (PRAVACHOL) 20 MG tablet [id number] Yes Take 1 tablet (20 mg total) by mouth daily. [name], MD Taking Active Self  Rimegepant Sulfate (NURTEC) 75 MG TBDP [id number] Yes Take 75 mg by mouth daily as needed. For migraines. Take as close to onset of migraine as possible. One daily maximum. Angela Beam, MD Taking Active Self  sodium bicarbonate 650 MG tablet [id number] Yes Take 1 tablet (650 mg total) by mouth 2 (two) times daily. [name], MD Taking Active Self          Assessment:   Goals Addressed              This Visit's Progress   .  "I want to take  better care of myself" (pt-stated)        [place] (see longitudinal plan of care for additional care plan information)  Current Barriers:  . Chronic Disease Management support and education needs related to Diabetes, Hypertension and Kidney disease. Patient reports that she would like to start taking better care of herself.  . Non-adherence to prescribed medication regimen. Patient reports that she tries to take her medications as prescribed, however she often forgets to take them. Needs help organizing her medications to help her remember to take them regularly.  Nurse Case Manager Clinical Goal(s):  [name] Over the next 30 days, patient will verbalize understanding of plan for Diabetes, hypertension and  renal disease-Met-Ms Ozer has met with Nephrology and PCP in November, has Endocrinology appointment today. . Over the next 30 days, patient will meet with RN Care Manager to address monitoring blood pressure and eating appropriately at home-Ms Cihlar has ordered BP cuff and is waiting for it to be delivered . Over the next 60 days, patient will verbalize basic understanding of Diabetes, hypertension and renal disease process and self health management plan as evidenced by checking blood pressure at home, daily weights and eating a heart healthy, diabetic diet. Ms. Menn reports that she is eating better and has set a schedule for medication adherence. Recent ED visit due to N/V. She has Zofran now and this seems to be working much better. . Over the next 30 days, patient will work with CM team pharmacist to review medications  Interventions:  . Inter-disciplinary care team collaboration (see longitudinal plan of care) . Evaluation of current treatment plan related to diabetes, hypertension and renal disease and patient's adherence to plan as established by provider. . Advised patient to schedule appointments with PCP and endocrinology, create a [employer] account and  request a medication kit with lock box and pill box, a BP monitor and scale. . Provided education to patient re: heart healthy and diabetic diet . Reviewed medications with patient and discussed taking medications as prescribed, setting alarms at certain times of the day medication adherence . Reviewed scheduled/upcoming provider appointments including:11/23/20 Endocrinology, Nephrology in March 2022,  . Pharmacy referral for medication review . Recommended patient to keep diabetic ice pops at home to have during episodes of nausea to avoid dehydration and taking in a protein rich snack before bed to avoid low blood sugars during the night.  Plan:  . Patient will continue to eat a heart healthy diabetic diet, begin to check BP and weight daily once she receives equipment, keep scheduled appointments. . Communicate any new symptoms, concerns or questions to provider . RNCM will send follow up on 12/25/20 @ 9am with a telephone visit   Please see past updates related to this goal by clicking on the "Past Updates" button in the selected goal        Plan: RNCM will follow up with a telephone visit on 12/25/20 @ 9am Follow-up:  Patient agrees to Care Plan and Follow-up.   Lurena Joiner RN, BSN [place]  [employer] [employer] manager

## 2020-11-23 NOTE — Patient Instructions (Signed)
Visit Information  Angela [name] was given information about Medicaid Managed Care team care coordination services as a part of their Healthy [facility] Medicaid benefit. Angela [name] verbally consented to engagement with the [facility] Managed Care team.   For questions related to your Healthy [facility] health plan, please call: [phone] or visit the homepage here: [email or web address]  If you would like to schedule transportation through your Healthy [facility] plan, please call the following number at least 2 days in advance of your appointment: [phone]  Goals Addressed              This Visit's Progress   .  "I want to take better care of myself" (pt-stated)        [place] (see longitudinal plan of care for additional care plan information)  Current Barriers:  . Chronic Disease Management support and education needs related to Diabetes, Hypertension and Kidney disease. Patient reports that she would like to start taking better care of herself.  . Non-adherence to prescribed medication regimen. Patient reports that she tries to take her medications as prescribed, however she often forgets to take them. Needs help organizing her medications to help her remember to take them regularly.  Nurse Case Manager Clinical Goal(s):  [name] Over the next 30 days, patient will verbalize understanding of plan for Diabetes, hypertension and renal disease-Met-Angela [name] has met with Nephrology and PCP in November, has Endocrinology appointment today. . Over the next 30 days, patient will meet with RN Care Manager to address monitoring blood pressure and eating appropriately at home-Angela [name] has ordered BP cuff and is waiting for it to be delivered . Over the next 60 days, patient will verbalize basic understanding of Diabetes, hypertension and renal disease process and self health management plan as evidenced by checking blood pressure  at home, daily weights and eating a heart healthy, diabetic diet. Angela. [name] reports that she is eating better and has set a schedule for medication adherence. Recent ED visit due to N/V. She has Zofran now and this seems to be working much better. . Over the next 30 days, patient will work with CM team pharmacist to review medications  Interventions:  . Inter-disciplinary care team collaboration (see longitudinal plan of care) . Evaluation of current treatment plan related to diabetes, hypertension and renal disease and patient's adherence to plan as established by provider. . Advised patient to schedule appointments with PCP and endocrinology, create a [employer] account and request a medication kit with lock box and pill box, a BP monitor and scale. . Provided education to patient re: heart healthy and diabetic diet . Reviewed medications with patient and discussed taking medications as prescribed, setting alarms at certain times of the day medication adherence . Reviewed scheduled/upcoming provider appointments including:11/23/20 Endocrinology, Nephrology in March 2022,  . Pharmacy referral for medication review . Recommended patient to keep diabetic ice pops at home to have during episodes of nausea to avoid dehydration and taking in a protein rich snack before bed to avoid low blood sugars during the night.  Plan:  . Patient will continue to eat a heart healthy diabetic diet, begin to check BP and weight daily once she receives equipment, keep scheduled appointments. . Communicate any new symptoms, concerns or questions to provider . RNCM will send follow up on 12/25/20 @ 9am with a telephone visit   Please see past updates related to this goal by clicking on the "Past Updates" button in the  selected goal         Please see education materials related to dehydration provided by MyChart link.    Dehydration, Adult Dehydration is condition in which there is not enough water or other  fluids in the body. This happens when a person loses more fluids than he or she takes in. Important body parts cannot work right without the right amount of fluids. Any loss of fluids from the body can cause dehydration. Dehydration can be mild, worse, or very bad. It should be treated right away to keep it from getting very bad. What are the causes? This condition may be caused by:  Conditions that cause loss of water or other fluids, such as: ? Watery poop (diarrhea). ? Vomiting. ? Sweating a lot. ? Peeing (urinating) a lot.  Not drinking enough fluids, especially when you: ? Are ill. ? Are doing things that take a lot of energy to do.  Other illnesses and conditions, such as fever or infection.  Certain medicines, such as medicines that take extra fluid out of the body (diuretics).  Lack of safe drinking water.  Not being able to get enough water and food. What increases the risk? The following factors may make you more likely to develop this condition:  Having a long-term (chronic) illness that has not been treated the right way, such as: ? Diabetes. ? Heart disease. ? Kidney disease.  Being 61 years of age or older.  Having a disability.  Living in a place that is high above the ground or sea (high in altitude). The thinner, dried air causes more fluid loss.  Doing exercises that put stress on your body for a long time. What are the signs or symptoms? Symptoms of dehydration depend on how bad it is. Mild or worse dehydration  Thirst.  Dry lips or dry mouth.  Feeling dizzy or light-headed, especially when you stand up from sitting.  Muscle cramps.  Your body making: ? Dark pee (urine). Pee may be the color of tea. ? Less pee than normal. ? Less tears than normal.  Headache. Very bad dehydration  Changes in skin. Skin may: ? Be cold to the touch (clammy). ? Be blotchy or pale. ? Not go back to normal right after you lightly pinch it and let it  go.  Little or no tears, pee, or sweat.  Changes in vital signs, such as: ? Fast breathing. ? Low blood pressure. ? Weak pulse. ? Pulse that is more than 100 beats a minute when you are sitting still.  Other changes, such as: ? Feeling very thirsty. ? Eyes that look hollow (sunken). ? Cold hands and feet. ? Being mixed up (confused). ? Being very tired (lethargic) or having trouble waking from sleep. ? Short-term weight loss. ? Loss of consciousness. How is this treated? Treatment for this condition depends on how bad it is. Treatment should start right away. Do not wait until your condition gets very bad. Very bad dehydration is an emergency. You will need to go to a hospital.  Mild or worse dehydration can be treated at home. You may be asked to: ? Drink more fluids. ? Drink an oral rehydration solution (ORS). This drink helps get the right amounts of fluids and salts and minerals in the blood (electrolytes).  Very bad dehydration can be treated: ? With fluids through an IV tube. ? By getting normal levels of salts and minerals in your blood. This is often done by giving salts and  minerals through a tube. The tube is passed through your nose and into your stomach. ? By treating the root cause. Follow these instructions at home: Oral rehydration solution If told by your doctor, drink an ORS:  Make an ORS. Use instructions on the package.  Start by drinking small amounts, about  cup (120 mL) every 5-10 minutes.  Slowly drink more until you have had the amount that your doctor said to have. Eating and drinking         Drink enough clear fluid to keep your pee pale yellow. If you were told to drink an ORS, finish the ORS first. Then, start slowly drinking other clear fluids. Drink fluids such as: ? Water. Do not drink only water. Doing that can make the salt (sodium) level in your body get too low. ? Water from ice chips you suck on. ? Fruit juice that you have added  water to (diluted). ? Low-calorie sports drinks.  Eat foods that have the right amounts of salts and minerals, such as: ? Bananas. ? Oranges. ? Potatoes. ? Tomatoes. ? Spinach.  Do not drink alcohol.  Avoid: ? Drinks that have a lot of sugar. These include:  High-calorie sports drinks.  Fruit juice that you did not add water to.  Soda.  Caffeine. ? Foods that are greasy or have a lot of fat or sugar. General instructions  Take over-the-counter and prescription medicines only as told by your doctor.  Do not take salt tablets. Doing that can make the salt level in your body get too high.  Return to your normal activities as told by your doctor. Ask your doctor what activities are safe for you.  Keep all follow-up visits as told by your doctor. This is important. Contact a doctor if:  You have pain in your belly (abdomen) and the pain: ? Gets worse. ? Stays in one place.  You have a rash.  You have a stiff neck.  You get angry or annoyed (irritable) more easily than normal.  You are more tired or have a harder time waking than normal.  You feel: ? Weak or dizzy. ? Very thirsty. Get help right away if you have:  Any symptoms of very bad dehydration.  Symptoms of vomiting, such as: ? You cannot eat or drink without vomiting. ? Your vomiting gets worse or does not go away. ? Your vomit has blood or green stuff in it.  Symptoms that get worse with treatment.  A fever.  A very bad headache.  Problems with peeing or pooping (having a bowel movement), such as: ? Watery poop that gets worse or does not go away. ? Blood in your poop (stool). This may cause poop to look black and tarry. ? Not peeing in 6-8 hours. ? Peeing only a small amount of very dark pee in 6-8 hours.  Trouble breathing. These symptoms may be an emergency. Do not wait to see if the symptoms will go away. Get medical help right away. Call your local emergency services (911 in the [place] Do  not drive yourself to the hospital. Summary  Dehydration is a condition in which there is not enough water or other fluids in the body. This happens when a person loses more fluids than he or she takes in.  Treatment for this condition depends on how bad it is. Treatment should be started right away. Do not wait until your condition gets very bad.  Drink enough clear fluid to keep your pee  pale yellow. If you were told to drink an oral rehydration solution (ORS), finish the ORS first. Then, start slowly drinking other clear fluids.  Take over-the-counter and prescription medicines only as told by your doctor.  Get help right away if you have any symptoms of very bad dehydration. This information is not intended to replace advice given to you by your health care provider. Make sure you discuss any questions you have with your health care provider. Document Revised: 07/18/2019 Document Reviewed: 07/18/2019 Elsevier Patient Education  Angela [name].   Patient verbalizes understanding of instructions provided today.   Telephone follow up appointment with Managed Medicaid care management team member scheduled for:12/25/20 @ Vera, RN

## 2020-11-24 DIAGNOSIS — E109 Type 1 diabetes mellitus without complications: Secondary | ICD-10-CM | POA: Diagnosis not present

## 2020-11-25 ENCOUNTER — Other Ambulatory Visit: Payer: Self-pay

## 2020-11-25 NOTE — Patient Instructions (Signed)
Visit Information  Angela [name] was given information about Medicaid Managed Care team care coordination services as a part of their Healthy [facility] Medicaid benefit. Angela [name] verbally consented to engagement with the [facility] Managed Care team.   For questions related to your Healthy [facility] health plan, please call: [phone] or visit the homepage here: [email or web address]  If you would like to schedule transportation through your Healthy [facility] plan, please call the following number at least 2 days in advance of your appointment: [phone]  Goals Addressed            This Visit's Progress   . Medication Management       Medicaid Managed Care CARE PLAN ENTRY (see longitudinal plan of care for additional care plan information)  Current Barriers:  . Social, financial, community barriers:  o Organizational: Patient keeps all medicines in vials in a drawer in the closet, she states she's constantly forgetting to take them because "Out of sight out of mind" o Educational: Patient doesn't know what medications are for or what they do. She doesn't think she needs "This many BP Meds" so she doesn't take any. Because she doesn't take any, her BP is always high and more BP meds get added on . Patient with complex health conditions multiple comorbidities including Dm1 and HTN . Self-manages medications. Does not use a pill box or other adherence strategies . Pharmacy  [facility] [id number] - [place], [place] - [address] [place] [address] Phone: [phone] Fax: [phone]     Pharmacist Clinical Goal(s):  [name] Over the next 14 days, patient will work with PharmD and provider towards optimized medication management  Interventions: . Comprehensive medication review performed; medication list updated in electronic medical record . Inter-disciplinary care team  collaboration (see longitudinal plan of care)    HTN BP Readings from Last 3 Encounters:  11/16/20 (!) 149/93  11/10/20 (!) 184/107  09/07/20 (!) 172/99    Furosemide Amlodipine (Misses 2days/week) Diltiazem (Never Takes) Hydralazine 50mg  take 75mg  TID (Does take this) Losartan 50mg  (Never Takes) -Very non-compliant -Patient doesn't know what medications are for or what they do. She doesn't think she needs "This many BP Meds" so she doesn't take any. Because she doesn't take any, her BP is always high and more BP meds get added on Plan: Patient will take meds daily so we can get a baseline BP. Will inquire with PCP about Amlodipine AND Diltiazem usage.   Cholesterol Lab Results  Component Value Date   CHOL 254 (H) 06/23/2017   CHOL 254 (H) 02/04/2016   CHOL 235 (H) 02/25/2014   Lab Results  Component Value Date   HDL 87 06/23/2017   HDL 67 02/04/2016   HDL 65 02/25/2014   Lab Results  Component Value Date   LDLCALC 142 (H) 06/23/2017   LDLCALC 116 02/04/2016   LDLCALC 148 (H) 02/25/2014   Lab Results  Component Value Date   TRIG 181 (H) 12/04/2019   TRIG 125 06/23/2017   TRIG 355 (H) 02/04/2016   Lab Results  Component Value Date   CHOLHDL 2.9 06/23/2017   CHOLHDL 3.8 02/04/2016   CHOLHDL 3.6 02/25/2014   No results found for: LDLDIRECT  Pravastatin 20mg  Plan: Due to risk factors and renal disease, will ask PCP about trial of Atorvastatin 80mg   Mood Depression screen [facility] 2/9 11/10/2020 05/08/2020 08/01/2019 02/15/2019 01/29/2019  Decreased Interest 1 2 2 1 1   Down, Depressed,  Hopeless 1 1 3 1 1   PHQ - 2 Score 2 3 5 2 2   Altered sleeping 1 2 3 1 1   Tired, decreased energy 1 2 2 1 1   Change in appetite 1 2 2 1 1   Feeling bad or failure about yourself  1 3 2 1 1   Trouble concentrating 1 3 1 1 1   Moving slowly or fidgety/restless 1 1 2 1 1   Suicidal thoughts 1 1 1  0 0  PHQ-9 Score 9 17 18 8 8   Some recent data might be hidden     Buspirone Escitalopram Plan: Patient states she's content with therapy so far  Pain Gabapentin 300mg  2 BID Plan: Patient had to pick nephew up from school, unable to assess/discuss at this time   DM Dr. Griffin Dakin manages Missed appt on Monday Aspart (Pump) Tyler Aas -Patient states sugars Used to be good but she just got out of a long term relationship and is stressed. Plan: Patient will call specialist ASAP to get new appt  Medicines: -Patient inquired about packaging system for medications to help remember, she has seen it through [place] but doesn't like the idea of it being delivered in the mail. Explained [facility] process and she'd like to try it for packaging  Verbal consent obtained for [facility] enhanced pharmacy services (medication synchronization, adherence packaging, delivery coordination). A medication sync plan was created to allow patient to get all medications delivered once every 30 to 90 days per patient preference. Patient understands they have freedom to choose pharmacy and clinical pharmacist will coordinate care between all prescribers and [facility].   Patient Self Care Activities:  . Patient will take medications as prescribed . Patient will focus on improved adherence by Next appointment   Initial goal documentation  Angela [name], Pharm.D., Managed Medicaid Pharmacist - [phone]        There are no care plans to display for this patient.   Please see education materials related to HTN and DM provided as print materials.   Patient verbalizes understanding of instructions provided today.   The Managed Medicaid care management team will reach out to the patient again over the next 14 days.   Angela [name], [facility]  Hypertension, Adult High blood pressure (hypertension) is when the force of blood pumping through the arteries is too strong. The arteries are the blood vessels that carry blood from the heart throughout the body.  Hypertension forces the heart to work harder to pump blood and may cause arteries to become narrow or stiff. Untreated or uncontrolled hypertension can cause a heart attack, heart failure, a stroke, kidney disease, and other problems. A blood pressure reading consists of a higher number over a lower number. Ideally, your blood pressure should be below 120/80. The first ("top") number is called the systolic pressure. It is a measure of the pressure in your arteries as your heart beats. The second ("bottom") number is called the diastolic pressure. It is a measure of the pressure in your arteries as the heart relaxes. What are the causes? The exact cause of this condition is not known. There are some conditions that result in or are related to high blood pressure. What increases the risk? Some risk factors for high blood pressure are under your control. The following factors may make you more likely to develop this condition:  Smoking.  Having type 2 diabetes mellitus, high cholesterol, or both.  Not getting enough exercise or physical activity.  Being overweight.  Having too  much fat, sugar, calories, or salt (sodium) in your diet.  Drinking too much alcohol. Some risk factors for high blood pressure may be difficult or impossible to change. Some of these factors include:  Having chronic kidney disease.  Having a family history of high blood pressure.  Age. Risk increases with age.  Race. You may be at higher risk if you are African American.  Gender. Men are at higher risk than women before age 1. After age 67, women are at higher risk than men.  Having obstructive sleep apnea.  Stress. What are the signs or symptoms? High blood pressure may not cause symptoms. Very high blood pressure (hypertensive crisis) may cause:  Headache.  Anxiety.  Shortness of breath.  Nosebleed.  Nausea and vomiting.  Vision changes.  Severe chest pain.  Seizures. How is this  diagnosed? This condition is diagnosed by measuring your blood pressure while you are seated, with your arm resting on a flat surface, your legs uncrossed, and your feet flat on the floor. The cuff of the blood pressure monitor will be placed directly against the skin of your upper arm at the level of your heart. It should be measured at least twice using the same arm. Certain conditions can cause a difference in blood pressure between your right and left arms. Certain factors can cause blood pressure readings to be lower or higher than normal for a short period of time:  When your blood pressure is higher when you are in a health care provider's office than when you are at home, this is called white coat hypertension. Most people with this condition do not need medicines.  When your blood pressure is higher at home than when you are in a health care provider's office, this is called masked hypertension. Most people with this condition may need medicines to control blood pressure. If you have a high blood pressure reading during one visit or you have normal blood pressure with other risk factors, you may be asked to:  Return on a different day to have your blood pressure checked again.  Monitor your blood pressure at home for 1 week or longer. If you are diagnosed with hypertension, you may have other blood or imaging tests to help your health care provider understand your overall risk for other conditions. How is this treated? This condition is treated by making healthy lifestyle changes, such as eating healthy foods, exercising more, and reducing your alcohol intake. Your health care provider may prescribe medicine if lifestyle changes are not enough to get your blood pressure under control, and if:  Your systolic blood pressure is above 130.  Your diastolic blood pressure is above 80. Your personal target blood pressure may vary depending on your medical conditions, your age, and other  factors. Follow these instructions at home: Eating and drinking   Eat a diet that is high in fiber and potassium, and low in sodium, added sugar, and fat. An example eating plan is called the DASH (Dietary Approaches to Stop Hypertension) diet. To eat this way: ? Eat plenty of fresh fruits and vegetables. Try to fill one half of your plate at each meal with fruits and vegetables. ? Eat whole grains, such as whole-wheat pasta, brown rice, or whole-grain bread. Fill about one fourth of your plate with whole grains. ? Eat or drink low-fat dairy products, such as skim milk or low-fat yogurt. ? Avoid fatty cuts of meat, processed or cured meats, and poultry with skin. Fill about  one fourth of your plate with lean proteins, such as fish, chicken without skin, beans, eggs, or tofu. ? Avoid pre-made and processed foods. These tend to be higher in sodium, added sugar, and fat.  Reduce your daily sodium intake. Most people with hypertension should eat less than 1,500 mg of sodium a day.  Do not drink alcohol if: ? Your health care provider tells you not to drink. ? You are pregnant, may be pregnant, or are planning to become pregnant.  If you drink alcohol: ? Limit how much you use to:  0-1 drink a day for women.  0-2 drinks a day for men. ? Be aware of how much alcohol is in your drink. In the [place] one drink equals one 12 oz bottle of beer (355 mL), one 5 oz glass of wine (148 mL), or one 1 oz glass of hard liquor (44 mL). Lifestyle   Work with your health care provider to maintain a healthy body weight or to lose weight. Ask what an ideal weight is for you.  Get at least 30 minutes of exercise most days of the week. Activities may include walking, swimming, or biking.  Include exercise to strengthen your muscles (resistance exercise), such as Pilates or lifting weights, as part of your weekly exercise routine. Try to do these types of exercises for 30 minutes at least 3 days a week.  Do  not use any products that contain nicotine or tobacco, such as cigarettes, e-cigarettes, and chewing tobacco. If you need help quitting, ask your health care provider.  Monitor your blood pressure at home as told by your health care provider.  Keep all follow-up visits as told by your health care provider. This is important. Medicines  Take over-the-counter and prescription medicines only as told by your health care provider. Follow directions carefully. Blood pressure medicines must be taken as prescribed.  Do not skip doses of blood pressure medicine. Doing this puts you at risk for problems and can make the medicine less effective.  Ask your health care provider about side effects or reactions to medicines that you should watch for. Contact a health care provider if you:  Think you are having a reaction to a medicine you are taking.  Have headaches that keep coming back (recurring).  Feel dizzy.  Have swelling in your ankles.  Have trouble with your vision. Get help right away if you:  Develop a severe headache or confusion.  Have unusual weakness or numbness.  Feel faint.  Have severe pain in your chest or abdomen.  Vomit repeatedly.  Have trouble breathing. Summary  Hypertension is when the force of blood pumping through your arteries is too strong. If this condition is not controlled, it may put you at risk for serious complications.  Your personal target blood pressure may vary depending on your medical conditions, your age, and other factors. For most people, a normal blood pressure is less than 120/80.  Hypertension is treated with lifestyle changes, medicines, or a combination of both. Lifestyle changes include losing weight, eating a healthy, low-sodium diet, exercising more, and limiting alcohol. This information is not intended to replace advice given to you by your health care provider. Make sure you discuss any questions you have with your health care  provider. Document Revised: 08/15/2018 Document Reviewed: 08/15/2018 Elsevier Patient Education  2020 [employer].

## 2020-12-03 ENCOUNTER — Encounter: Payer: Self-pay | Admitting: Internal Medicine

## 2020-12-04 ENCOUNTER — Other Ambulatory Visit: Payer: Self-pay | Admitting: Internal Medicine

## 2020-12-04 DIAGNOSIS — N184 Chronic kidney disease, stage 4 (severe): Secondary | ICD-10-CM

## 2020-12-04 DIAGNOSIS — E1029 Type 1 diabetes mellitus with other diabetic kidney complication: Secondary | ICD-10-CM

## 2020-12-08 ENCOUNTER — Ambulatory Visit: Payer: Medicaid Other | Attending: Internal Medicine

## 2020-12-08 ENCOUNTER — Other Ambulatory Visit: Payer: Self-pay

## 2020-12-08 DIAGNOSIS — N184 Chronic kidney disease, stage 4 (severe): Secondary | ICD-10-CM

## 2020-12-08 NOTE — Patient Instructions (Signed)
Visit Information  Ms. Zaucha was given information about Medicaid Managed Care team care coordination services as a part of their Healthy [facility] Medicaid benefit. [name] verbally consented to engagement with the [facility] Managed Care team.   For questions related to your Healthy [facility] health plan, please call: [phone] or visit the homepage here: [email or web address]  If you would like to schedule transportation through your Healthy [facility] plan, please call the following number at least 2 days in advance of your appointment: [phone]  Goals Addressed            This Visit's Progress   . Medication Management       Medicaid Managed Care CARE PLAN ENTRY (see longitudinal plan of care for additional care plan information)  Current Barriers:  . Social, financial, community barriers:  o Organizational: Patient keeps all medicines in vials in a drawer in the closet, she states she's constantly forgetting to take them because "Out of sight out of mind" o Educational: Patient doesn't know what medications are for or what they do. She doesn't think she needs "This many BP Meds" so she doesn't take any. Because she doesn't take any, her BP is always high and more BP meds get added on . Patient with complex health conditions multiple comorbidities including Dm1 and HTN . Self-manages medications. Does not use a pill box or other adherence strategies . Pharmacy  [facility] [id number] - [place], [place] - [address] [place] [address] Phone: [phone] Fax: [phone]     Pharmacist Clinical Goal(s):  [name] Over the next 14 days, patient will work with PharmD and provider towards optimized medication management  Interventions: . Comprehensive medication review performed; medication list updated in electronic medical record . Inter-disciplinary care team  collaboration (see longitudinal plan of care)    HTN BP Readings from Last 3 Encounters:  11/16/20 (!) 149/93  11/10/20 (!) 184/107  09/07/20 (!) 172/99    Furosemide Amlodipine (Misses 2days/week) Hydralazine 50mg  take 75mg  TID (Does take this) Losartan 50mg  (Never Takes) -Very non-compliant -Patient doesn't know what medications are for or what they do. She doesn't think she needs "This many BP Meds" so she doesn't take any. Because she doesn't take any, her BP is always high and more BP meds get added on Plan: Patient will take meds daily so we can get a baseline BP. Will inquire with PCP about Amlodipine AND Diltiazem usage. 12/08/20: PCP states patient on Amlodipine, not Dilt   Cholesterol Lab Results  Component Value Date   CHOL 254 (H) 06/23/2017   CHOL 254 (H) 02/04/2016   CHOL 235 (H) 02/25/2014   Lab Results  Component Value Date   HDL 87 06/23/2017   HDL 67 02/04/2016   HDL 65 02/25/2014   Lab Results  Component Value Date   LDLCALC 142 (H) 06/23/2017   LDLCALC 116 02/04/2016   LDLCALC 148 (H) 02/25/2014   Lab Results  Component Value Date   TRIG 181 (H) 12/04/2019   TRIG 125 06/23/2017   TRIG 355 (H) 02/04/2016   Lab Results  Component Value Date   CHOLHDL 2.9 06/23/2017   CHOLHDL 3.8 02/04/2016   CHOLHDL 3.6 02/25/2014   No results found for: LDLDIRECT  Pravastatin 20mg  Plan: Patient hasn't taken in months, will start with Pravastatin then increase to higher intensity once she's been on therapy for a month  Mood Depression screen [facility] 2/9 11/10/2020 05/08/2020 08/01/2019 02/15/2019  01/29/2019  Decreased Interest 1 2 2 1 1   Down, Depressed, Hopeless 1 1 3 1 1   PHQ - 2 Score 2 3 5 2 2   Altered sleeping 1 2 3 1 1   Tired, decreased energy 1 2 2 1 1   Change in appetite 1 2 2 1 1   Feeling bad or failure about yourself  1 3 2 1 1   Trouble concentrating 1 3 1 1 1   Moving slowly or fidgety/restless 1 1 2 1 1   Suicidal thoughts 1 1 1  0 0  PHQ-9 Score  9 17 18 8 8   Some recent data might be hidden    Buspirone Escitalopram Plan: Patient states she's content with therapy so far  Pain Gabapentin 300mg  2 BID Plan: Patient had to pick nephew up from school, unable to assess/discuss at this time   DM Dr. Griffin Dakin manages Missed appt on Monday Aspart (Pump) Tyler Aas -Patient states sugars Used to be good but she just got out of a long term relationship and is stressed. Plan: Patient is still non-compliant, she's out of insulin. Told her to pick up ASAP today,   Medicines: -Patient agreed to start packaging on 01/08/20  Verbal consent obtained for [facility] enhanced pharmacy services (medication synchronization, adherence packaging, delivery coordination). A medication sync plan was created to allow patient to get all medications delivered once every 30 to 90 days per patient preference. Patient understands they have freedom to choose pharmacy and clinical pharmacist will coordinate care between all prescribers and [facility].   Patient Self Care Activities:  . Patient will take medications as prescribed . Patient will focus on improved adherence by Next appointment   Initial goal documentation  [place] Constable, Pharm.D., Managed Medicaid Pharmacist - [phone]        There are no care plans to display for this patient.   Please see education materials related to DM provided as print materials.   Patient verbalizes understanding of instructions provided today.   The Managed Medicaid care management team will reach out to the patient again over the next 30 days.   Lane Hacker, [facility]

## 2020-12-09 ENCOUNTER — Telehealth: Payer: Self-pay | Admitting: Internal Medicine

## 2020-12-09 DIAGNOSIS — I10 Essential (primary) hypertension: Secondary | ICD-10-CM

## 2020-12-09 DIAGNOSIS — G43709 Chronic migraine without aura, not intractable, without status migrainosus: Secondary | ICD-10-CM

## 2020-12-09 DIAGNOSIS — E1042 Type 1 diabetes mellitus with diabetic polyneuropathy: Secondary | ICD-10-CM

## 2020-12-09 LAB — BASIC METABOLIC PANEL
BUN/Creatinine Ratio: 8 — ABNORMAL LOW (ref 9–23)
BUN: 41 mg/dL — ABNORMAL HIGH (ref 6–20)
CO2: 21 mmol/L (ref 20–29)
Calcium: 9.3 mg/dL (ref 8.7–10.2)
Chloride: 97 mmol/L (ref 96–106)
Creatinine, Ser: 5.32 mg/dL — ABNORMAL HIGH (ref 0.57–1.00)
GFR calc Af Amer: 11 mL/min/{1.73_m2} — ABNORMAL LOW (ref >59)
GFR calc non Af Amer: 10 mL/min/{1.73_m2} — ABNORMAL LOW (ref >59)
Glucose: 529 mg/dL (ref 65–99)
Potassium: 5.5 mmol/L — ABNORMAL HIGH (ref 3.5–5.2)
Sodium: 131 mmol/L — ABNORMAL LOW (ref 134–144)

## 2020-12-09 MED ORDER — GABAPENTIN 300 MG PO CAPS: 600.0000 mg | ORAL_CAPSULE | Freq: Two times a day (BID) | ORAL | 6 refills | Status: DC

## 2020-12-09 MED ORDER — HYDRALAZINE HCL 50 MG PO TABS: 100.0000 mg | ORAL_TABLET | Freq: Three times a day (TID) | ORAL | 6 refills | Status: DC

## 2020-12-09 MED ORDER — FUROSEMIDE 40 MG PO TABS
40.0000 mg | ORAL_TABLET | Freq: Every day | ORAL | 6 refills | Status: DC
Start: 2020-12-09 — End: 2021-07-26

## 2020-12-09 MED ORDER — AMLODIPINE BESYLATE 10 MG PO TABS: 10.0000 mg | ORAL_TABLET | Freq: Every day | ORAL | 6 refills | Status: DC

## 2020-12-09 MED ORDER — PRAVASTATIN SODIUM 20 MG PO TABS
20.0000 mg | ORAL_TABLET | Freq: Every day | ORAL | 6 refills | Status: DC
Start: 2020-12-09 — End: 2021-07-26

## 2020-12-09 MED ORDER — LOSARTAN POTASSIUM 50 MG PO TABS
50.0000 mg | ORAL_TABLET | Freq: Every day | ORAL | 6 refills | Status: DC
Start: 2020-12-09 — End: 2021-12-01

## 2020-12-09 NOTE — Telephone Encounter (Signed)
Received phone call around 8 AM this morning from on-call nurse Lovena Le with alert lab results.  Blood glucose on testing done yesterday was 529.  She states patient reports vomiting for the past several days with dry mouth.  She instructed patient to be seen in the emergency room which I said was appropriate. However, on review of her lab results, anion gap is 13, creatinine is 5.3 which is improved from 1 month ago and at baseline compared to when checked at [facility] 10/19/20 with creat 5.28.  Current potassium level eelv at 5.5 but may be due to elev BS. Message sent to pt via [facility]. Results for orders placed or performed in visit on 42/87/68  Basic Metabolic Panel  Result Value Ref Range   Glucose 529 (HH) 65 - 99 mg/dL   BUN 41 (H) 6 - 20 mg/dL   Creatinine, Ser 5.32 (H) 0.57 - 1.00 mg/dL   GFR calc non Af Amer 10 (L) >59 mL/min/1.73   GFR calc Af Amer 11 (L) >59 mL/min/1.73   BUN/Creatinine Ratio 8 (L) 9 - 23   Sodium 131 (L) 134 - 144 mmol/L   Potassium 5.5 (H) 3.5 - 5.2 mmol/L   Chloride 97 96 - 106 mmol/L   CO2 21 20 - 29 mmol/L   Calcium 9.3 8.7 - 10.2 mg/dL   .

## 2020-12-09 NOTE — Telephone Encounter (Signed)
-----   Message from Lane Hacker, [facility] sent at 12/09/2020  9:49 AM EST ----- Regarding: Medications Good Morning!  Could you please send refills of   Amlodipine, Furosemide, Gabapentin, Hydralazine, Losartan, Pravastatin, Nurtec, or Sodium Bicarbonate.  To [facility] on [address] in [place]? I'm trying to get all her meds into packaging and delivered on the 28th!  -Ovid Curd

## 2020-12-09 NOTE — Progress Notes (Signed)
Opened in error.  Message sent to Adm pool requesting she be given next available apt.

## 2020-12-10 ENCOUNTER — Telehealth: Payer: Self-pay | Admitting: Internal Medicine

## 2020-12-10 ENCOUNTER — Encounter: Payer: Self-pay | Admitting: Internal Medicine

## 2020-12-10 ENCOUNTER — Other Ambulatory Visit: Payer: Self-pay | Admitting: Neurology

## 2020-12-10 NOTE — Patient Outreach (Signed)
Spoke with Carlene from Nephrology, verbal to discontinue the patient's Sodium Bicarb

## 2020-12-10 NOTE — Telephone Encounter (Signed)
-----   Message from Arabella Merles, RN sent at 12/09/2020 12:08 PM EST ----- Regarding: FW: Give next available appt with me  ----- Message ----- From: [name], MD Sent: 12/09/2020  10:02 AM EST To: [facility] Admin Pool Subject: Give next available appt with me

## 2020-12-10 NOTE — Telephone Encounter (Signed)
Called pt to sched an appt w/Dr. Durenda Age (per Dr's instructions). LVM to call back to schedule.

## 2020-12-13 ENCOUNTER — Other Ambulatory Visit: Payer: Self-pay | Admitting: Internal Medicine

## 2020-12-13 DIAGNOSIS — E1029 Type 1 diabetes mellitus with other diabetic kidney complication: Secondary | ICD-10-CM

## 2020-12-25 ENCOUNTER — Other Ambulatory Visit: Payer: Self-pay | Admitting: *Deleted

## 2020-12-25 DIAGNOSIS — E109 Type 1 diabetes mellitus without complications: Secondary | ICD-10-CM | POA: Diagnosis not present

## 2020-12-25 NOTE — Patient Instructions (Signed)
Visit Information  [name]  - as a part of your Medicaid benefit, you are eligible for care management and care coordination services at no cost or copay. I was unable to reach you by phone today but would be happy to help you with your health related needs. Please feel free to call me @ [phone].   A member of the Managed Medicaid care management team will reach out to you again over the next 7-14 days.   Lurena Joiner RN, BSN [place]  [employer] manager

## 2020-12-25 NOTE — Patient Outreach (Signed)
Care Coordination  12/25/2020  Angela [name] 01-30-1986 [id number]    Medicaid Managed Care   Unsuccessful Outreach Note  12/25/2020 Name: Angela [name] MRN: [id number] DOB: 1986-03-02  Referred by: [name], MD Reason for referral : High Risk Managed Medicaid (Unsuccessful RNCM follow up outreach)   An unsuccessful telephone outreach was attempted today. The patient was referred to the case management team for assistance with care management and care coordination.   Follow Up Plan: The care management team will reach out to the patient again over the next 7-14 days.   Lurena Joiner RN, BSN [place]  [employer] manager

## 2020-12-27 ENCOUNTER — Other Ambulatory Visit: Payer: Self-pay | Admitting: Neurology

## 2020-12-27 DIAGNOSIS — G43709 Chronic migraine without aura, not intractable, without status migrainosus: Secondary | ICD-10-CM

## 2020-12-28 ENCOUNTER — Encounter: Payer: Self-pay | Admitting: Internal Medicine

## 2020-12-28 ENCOUNTER — Ambulatory Visit (HOSPITAL_BASED_OUTPATIENT_CLINIC_OR_DEPARTMENT_OTHER): Payer: Medicaid Other | Admitting: Internal Medicine

## 2020-12-28 ENCOUNTER — Other Ambulatory Visit: Payer: Self-pay

## 2020-12-28 VITALS — BP 188/97 | HR 89 | Temp 98.2°F | Resp 16 | Wt 138.4 lb

## 2020-12-28 DIAGNOSIS — L299 Pruritus, unspecified: Secondary | ICD-10-CM

## 2020-12-28 DIAGNOSIS — Z23 Encounter for immunization: Secondary | ICD-10-CM | POA: Insufficient documentation

## 2020-12-28 DIAGNOSIS — D631 Anemia in chronic kidney disease: Secondary | ICD-10-CM | POA: Diagnosis not present

## 2020-12-28 DIAGNOSIS — M79605 Pain in left leg: Secondary | ICD-10-CM

## 2020-12-28 DIAGNOSIS — I129 Hypertensive chronic kidney disease with stage 1 through stage 4 chronic kidney disease, or unspecified chronic kidney disease: Secondary | ICD-10-CM | POA: Diagnosis not present

## 2020-12-28 DIAGNOSIS — D649 Anemia, unspecified: Secondary | ICD-10-CM | POA: Diagnosis not present

## 2020-12-28 DIAGNOSIS — N185 Chronic kidney disease, stage 5: Secondary | ICD-10-CM | POA: Diagnosis not present

## 2020-12-28 DIAGNOSIS — E039 Hypothyroidism, unspecified: Secondary | ICD-10-CM

## 2020-12-28 DIAGNOSIS — G4709 Other insomnia: Secondary | ICD-10-CM | POA: Diagnosis not present

## 2020-12-28 DIAGNOSIS — E1029 Type 1 diabetes mellitus with other diabetic kidney complication: Secondary | ICD-10-CM

## 2020-12-28 DIAGNOSIS — I10 Essential (primary) hypertension: Secondary | ICD-10-CM | POA: Diagnosis not present

## 2020-12-28 LAB — POCT GLYCOSYLATED HEMOGLOBIN (HGB A1C): HbA1c, POC (controlled diabetic range): 10.2 % — AB (ref 0.0–7.0)

## 2020-12-28 LAB — GLUCOSE, POCT (MANUAL RESULT ENTRY): POC Glucose: 108 mg/dl — AB (ref 70–99)

## 2020-12-28 MED ORDER — TRIAMCINOLONE ACETONIDE 0.1 % EX CREA: 1.0000 "application " | TOPICAL_CREAM | Freq: Every day | CUTANEOUS | 1 refills | Status: DC | PRN

## 2020-12-28 MED ORDER — AMLODIPINE BESYLATE 10 MG PO TABS: 10.0000 mg | ORAL_TABLET | Freq: Every day | ORAL | 6 refills | Status: DC

## 2020-12-28 MED ORDER — TRAZODONE HCL 50 MG PO TABS: 25.0000 mg | ORAL_TABLET | Freq: Every evening | ORAL | 1 refills | Status: DC | PRN

## 2020-12-28 NOTE — Patient Instructions (Signed)
Influenza Virus Vaccine injection (Fluarix) What is this medicine? INFLUENZA VIRUS VACCINE (in floo EN zuh VAHY ruhs vak SEEN) helps to reduce the risk of getting influenza also known as the flu. This medicine may be used for other purposes; ask your health care provider or pharmacist if you have questions. COMMON BRAND NAME(S): Fluarix, Fluzone What should I tell my health care provider before I take this medicine? They need to know if you have any of these conditions:  bleeding disorder like hemophilia  fever or infection  Guillain-Barre syndrome or other neurological problems  immune system problems  infection with the human immunodeficiency virus (HIV) or AIDS  low blood platelet counts  multiple sclerosis  an unusual or allergic reaction to influenza virus vaccine, eggs, chicken proteins, latex, gentamicin, other medicines, foods, dyes or preservatives  pregnant or trying to get pregnant  breast-feeding How should I use this medicine? This vaccine is for injection into a muscle. It is given by a health care professional. A copy of Vaccine Information Statements will be given before each vaccination. Read this sheet carefully each time. The sheet may change frequently. Talk to your pediatrician regarding the use of this medicine in children. Special care may be needed. Overdosage: If you think you have taken too much of this medicine contact a poison control center or emergency room at once. NOTE: This medicine is only for you. Do not share this medicine with others. What if I miss a dose? This does not apply. What may interact with this medicine?  chemotherapy or radiation therapy  medicines that lower your immune system like etanercept, anakinra, infliximab, and adalimumab  medicines that treat or prevent blood clots like warfarin  phenytoin  steroid medicines like prednisone or cortisone  theophylline  vaccines This list may not describe all possible  interactions. Give your health care provider a list of all the medicines, herbs, non-prescription drugs, or dietary supplements you use. Also tell them if you smoke, drink alcohol, or use illegal drugs. Some items may interact with your medicine. What should I watch for while using this medicine? Report any side effects that do not go away within 3 days to your doctor or health care professional. Call your health care provider if any unusual symptoms occur within 6 weeks of receiving this vaccine. You may still catch the flu, but the illness is not usually as bad. You cannot get the flu from the vaccine. The vaccine will not protect against colds or other illnesses that may cause fever. The vaccine is needed every year. What side effects may I notice from receiving this medicine? Side effects that you should report to your doctor or health care professional as soon as possible:  allergic reactions like skin rash, itching or hives, swelling of the face, lips, or tongue Side effects that usually do not require medical attention (report to your doctor or health care professional if they continue or are bothersome):  fever  headache  muscle aches and pains  pain, tenderness, redness, or swelling at site where injected  weak or tired This list may not describe all possible side effects. Call your doctor for medical advice about side effects. You may report side effects to FDA at 1-800-FDA-1088. Where should I keep my medicine? This vaccine is only given in a clinic, pharmacy, doctor's office, or other health care setting and will not be stored at home. NOTE: This sheet is a summary. It may not cover all possible information. If you have questions   about this medicine, talk to your doctor, pharmacist, or health care provider.  2021 Elsevier/Gold Standard (2008-07-02 09:30:40)  

## 2020-12-28 NOTE — Progress Notes (Signed)
Virtual Visit via Telephone Note This was supposed to be an in person visit.  Patient came to the office but subsequently left before being seen and requested that it be change to a telemedicine visit. I connected with Angela [name] on 12/28/20 at  2:10 PM EST by telephone and verified that I am speaking with the correct person using two identifiers.  Location: Patient: home Provider: office The patient, my CMA Sallyanne Havers, pt's girlfriend and myself participated in this visit I discussed the limitations, risks, security and privacy concerns of performing an evaluation and management service by telephone and the availability of in person appointments. I also discussed with the patient that there may be a patient responsible charge related to this service. The patient expressed understanding and agreed to proceed.   History of Present Illness: Pt with hx of DM type 1 with neuropathy, gastroparesis and nephropathy, HTN, CKD stage 5, ACD/IDA, hypothyroid, HL, migraines  Patient has 5 major concerns today.  1.  Insomnia:  4-5 mth duration Usually gets tired about 9 p.m  Goes to bed then wakes up around 11 p.m and unable to fall back asleep.so she plays games on her phone, try to listen to coming background music or she watches TV in bed.  Does not drink any caffeinated beverages or alcoholic beverages in the evenings.  She tried melatonin without relief.  She also has tried taking Benadryl which she was taking more for itching but none of them seem to help with the insomnia.  She had sent me a MyChart message several weeks ago requesting medication to help her sleep.  I told her to make an appointment for [place] to discuss.  2.  Hypothyroid: Previously on levothyroxine 200 mcg daily.  Levothyroxine decreased to 175 mcg from 200 mcg.  She states that the new dose comes in the form of a blue pill.  She notes that when she takes the pill her whole body swells.  She stopped taking the medication about 1 month  ago.  Her endocrinologist is Dr. Denton Lank at [facility].  Reports that her level has not been checked in about a year.  She spoke with Dr. Delena Bali office last week and was told to come to the lab to have thyroid level checked which he plans to do this week.  However she did not mention to them the problem that she was having with the coating on the 175 mcg pill.  3.  C/o generalized itching from scalp down. Worse in legs and back.  No rash. No one else in house with itching.  No bed bugs. No change in body products or washing detergent.  Using Benadryl without relief.   Duration x 1 mth Of note she has advanced kidney disease.  She has had an HD fistula placed.  See neph 02/23/2021.   4.  Complains of intermittent pain in the left leg mainly on the side of the knee.  It feels like a cold chill and goes up to her pelvis.  She thinks she has enlarged lymph nodes in the inguinal area on this side present x 2-3 mths  5.  She would like to get her blood checked for the anemia.  HTN: And note that her blood pressure is elevated.  Reports that she has been out of amlodipine BP elev.  Out of Amlodipine but taking hydralazine. Outpatient Encounter Medications as of 12/28/2020  Medication Sig Note  . acetaminophen (TYLENOL) 325 MG tablet Take 650 mg by mouth every  6 (six) hours as needed for mild pain, moderate pain, fever or headache. (Patient not taking: Reported on 12/08/2020)   . albuterol (PROVENTIL HFA;VENTOLIN HFA) 108 (90 Base) MCG/ACT inhaler Inhale 2 puffs into the lungs every 6 (six) hours as needed for wheezing or shortness of breath. (Patient not taking: Reported on 12/08/2020)   . amLODipine (NORVASC) 10 MG tablet Take 1 tablet (10 mg total) by mouth daily. Please make PCP appointment.   [name] azelastine (ASTELIN) 0.1 % nasal spray Place into the nose. (Patient not taking: Reported on 12/08/2020)   . Blood Pressure Monitor DEVI Use as directed to check home blood pressure 2-3 times a week (Patient not  taking: No sig reported)   . busPIRone (BUSPAR) 15 MG tablet Take 1 tablet (15 mg total) by mouth 2 (two) times daily. (Patient not taking: Reported on 12/08/2020)   . Continuous Blood Gluc Sensor (DEXCOM G6 SENSOR) MISC SMARTSIG:1 Each Topical Every 10 Days (Patient not taking: Reported on 12/08/2020)   . diltiazem (CARTIA XT) 240 MG 24 hr capsule Take 1 capsule (240 mg total) by mouth daily. (Patient not taking: Reported on 12/08/2020) 12/08/2020: DC per msg from Dr. Wynetta Emery  . escitalopram (LEXAPRO) 20 MG tablet Take 1 tablet (20 mg total) by mouth daily. (Patient not taking: Reported on 12/08/2020)   . furosemide (LASIX) 40 MG tablet Take 1 tablet (40 mg total) by mouth daily.   [name] gabapentin (NEURONTIN) 300 MG capsule Take 2 capsules (600 mg total) by mouth 2 (two) times daily.   . Galcanezumab-gnlm (EMGALITY) 120 MG/ML SOAJ Inject 120 mg into the skin every 30 (thirty) days. (Patient not taking: Reported on 12/08/2020) 11/23/2020: Last inj 11/07/20  . hydrALAZINE (APRESOLINE) 50 MG tablet Take 2 tablets (100 mg total) by mouth 3 (three) times daily.   . insulin aspart (NOVOLOG) 100 UNIT/ML injection USE AS DIRECTED IN INSULIN PUMP (Patient not taking: Reported on 12/08/2020)   . Insulin Degludec (TRESIBA) 100 UNIT/ML SOLN Inject 22 Units into the skin daily. (Patient not taking: Reported on 12/08/2020)   . Insulin Pen Needle 32G X 4 MM MISC Used to give insulin injections four times daily. (Patient not taking: Reported on 12/08/2020)   . Insulin Syringe-Needle U-100 (BD INSULIN SYRINGE ULTRAFINE) 31G X 15/64" 1 ML MISC Used to give daily insulin injections. (Patient not taking: Reported on 12/08/2020)   . levothyroxine (SYNTHROID) 175 MCG tablet TAKE 1 TABLET BY MOUTH DAILY BEFORE BREAKFAST (Patient not taking: Reported on 12/08/2020)   . loratadine (CLARITIN) 10 MG tablet 1 tab every 24-48 hours PRN (Patient not taking: Reported on 12/08/2020)   . losartan (COZAAR) 50 MG tablet Take 1 tablet (50  mg total) by mouth daily.   . ondansetron (ZOFRAN-ODT) 4 MG disintegrating tablet Take 1 tablet (4 mg total) by mouth every 8 (eight) hours as needed.   . pravastatin (PRAVACHOL) 20 MG tablet Take 1 tablet (20 mg total) by mouth daily.   . Rimegepant Sulfate (NURTEC) 75 MG TBDP Take 75 mg by mouth daily as needed. For migraines. Take as close to onset of migraine as possible. One daily maximum. (Patient not taking: Reported on 12/08/2020)   . sodium bicarbonate 650 MG tablet Take 1 tablet (650 mg total) by mouth 2 (two) times daily. (Patient not taking: No sig reported)   . [DISCONTINUED] metoCLOPramide (REGLAN) 5 MG tablet Take 1 tablet (5 mg total) by mouth 3 (three) times daily as needed for nausea or vomiting. (Patient not taking: Reported on  06/25/2020)   . [DISCONTINUED] pantoprazole (PROTONIX) 40 MG tablet Take 1 tablet (40 mg total) by mouth daily. (Patient not taking: Reported on 06/25/2020)    No facility-administered encounter medications on file as of 12/28/2020.      Observations/Objective:  Blood pressure (!) 188/97, pulse 89, temperature 98.2 F (36.8 C), resp. rate 16, weight 138 lb 6.4 oz (62.8 kg), SpO2 99 %.  Results for orders placed or performed in visit on 12/28/20  POCT glucose (manual entry)  Result Value Ref Range   POC Glucose 108 (A) 70 - 99 mg/dl  POCT glycosylated hemoglobin (Hb A1C)  Result Value Ref Range   Hemoglobin A1C     HbA1c POC (<> result, manual entry)     HbA1c, POC (prediabetic range)     HbA1c, POC (controlled diabetic range) 10.2 (A) 0.0 - 7.0 %     Assessment and Plan: 1. Other insomnia Good sleep hygiene discussed and encouraged.  Advised patient to get in bed about the same time every night.  I would advise trying to get in bed a little later than 9 PM since she wakes back up at 11 PM.  Once she is in bed she should turn off all lights and sounds and put her phone on fibroid.  I advised against laying in bed watching TV or playing games on her  phone.  If unable to fall asleep within 45 minutes of getting in bed then she should get up and do something until she feels tired again and then try getting in bed.  We will try her with a low-dose of trazodone. - traZODone (DESYREL) 50 MG tablet; Take 0.5 tablets (25 mg total) by mouth at bedtime as needed for sleep.  Dispense: 30 tablet; Refill: 1  2. Itch of skin Differential diagnosis can include body products or azotemia associated with CKD.  Doubt lice or bedbugs as her female partner is not having any itching.  I have prescribed some triamcinolone cream and told her to use it sparingly and mainly on the areas that are most bothersome being her legs and her back.  Advised that long-term use of steroid cream can cause thinning of the skin. - triamcinolone (KENALOG) 0.1 %; Apply 1 application topically daily as needed.  Dispense: 30 g; Refill: 1  3. Hypothyroidism, unspecified type I had advised patient to make sure and get the thyroid level checked and follow-up with Dr. Denton Lank.  However I later realized that thyroid level was already drawn through our lab before she left before being seen today as was ordered by my CMA. -Issue that she is having may be due to the coating on the pill from what ever manufacturer the pharmacy is getting the levothyroxine from. - TSH  4. Anemia due to stage 5 chronic kidney disease, not on chronic dialysis (HCC) - Iron, TIBC and Ferritin Panel  5. Essential hypertension Refill sent on Norvasc.  She already had refills on [facility] that [facility]  However patient states that she will not be receiving delivery until the 22nd of this month.  So I have sent a prescription to her local pharmacy at [facility]. - CBC - Comprehensive metabolic panel - Lipid panel - amLODipine (NORVASC) 10 MG tablet; Take 1 tablet (10 mg total) by mouth daily. Please make PCP appointment.  Dispense: 30 tablet; Refill: 6  6. 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  She follows with the endocrinologist at [facility]. - POCT glucose (manual entry) - POCT glycosylated hemoglobin (Hb A1C) - Microalbumin / creatinine urine ratio  7. CKD (chronic kidney disease) stage 5, GFR less than 15 ml/min (HCC) Followed by nephrology at [facility].  8. Influenza vaccine needed 9. Need for immunization against influenza given - Flu Vaccine QUAD 36+ mos IM  10.  Leg pain Advised that an in person visit will be needed to evaluate this further.  Message sent to my CMA to schedule her. Follow Up Instructions:    I discussed the assessment and treatment plan with the patient. The patient was provided an opportunity to ask questions and all were answered. The patient agreed with the plan and demonstrated an understanding of the instructions.   The patient was advised to call back or seek an in-person evaluation if the symptoms worsen or if the condition fails to improve as anticipated.  I provided 24 minutes of non-face-to-face time during this encounter.   Karle Plumber, MD

## 2020-12-28 NOTE — Progress Notes (Signed)
Pt states when she goes to sleep she can't stay sleep   Pt states she hasn't had a good night sleep in 5 months   Pt states she stopped smoking marijuana   Pt states her 4 major concerns for today is visit: 1. Sleeping 2. Itching 3. Left leg pain 4. Pt states when she takes the blue pill for her thyroid it makes her sick to the stomach she is unable to take it but if she takes a different color pill of the thyroid medicine she is fine  Pt states she would like to get her iron checked

## 2020-12-29 LAB — COMPREHENSIVE METABOLIC PANEL
ALT: 12 IU/L (ref 0–32)
AST: 15 IU/L (ref 0–40)
Albumin/Globulin Ratio: 1.5 (ref 1.2–2.2)
Albumin: 3.9 g/dL (ref 3.8–4.8)
Alkaline Phosphatase: 114 IU/L (ref 44–121)
BUN/Creatinine Ratio: 7 — ABNORMAL LOW (ref 9–23)
BUN: 42 mg/dL — ABNORMAL HIGH (ref 6–20)
Bilirubin Total: <0.2 mg/dL (ref 0.0–1.2)
CO2: 20 mmol/L (ref 20–29)
Calcium: 9.7 mg/dL (ref 8.7–10.2)
Chloride: 102 mmol/L (ref 96–106)
Creatinine, Ser: 6.06 mg/dL — ABNORMAL HIGH (ref 0.57–1.00)
GFR calc Af Amer: 10 mL/min/{1.73_m2} — ABNORMAL LOW (ref >59)
GFR calc non Af Amer: 8 mL/min/{1.73_m2} — ABNORMAL LOW (ref >59)
Globulin, Total: 2.6 g/dL (ref 1.5–4.5)
Glucose: 88 mg/dL (ref 65–99)
Potassium: 4.5 mmol/L (ref 3.5–5.2)
Sodium: 136 mmol/L (ref 134–144)
Total Protein: 6.5 g/dL (ref 6.0–8.5)

## 2020-12-29 LAB — TSH: TSH: 111 u[IU]/mL — ABNORMAL HIGH (ref 0.450–4.500)

## 2020-12-29 LAB — CBC
Hematocrit: 25.5 % — ABNORMAL LOW (ref 34.0–46.6)
Hemoglobin: 8 g/dL — ABNORMAL LOW (ref 11.1–15.9)
MCH: 27.7 pg (ref 26.6–33.0)
MCHC: 31.4 g/dL — ABNORMAL LOW (ref 31.5–35.7)
MCV: 88 fL (ref 79–97)
Platelets: 146 10*3/uL — ABNORMAL LOW (ref 150–450)
RBC: 2.89 x10E6/uL — ABNORMAL LOW (ref 3.77–5.28)
RDW: 11.9 % (ref 11.7–15.4)
WBC: 5 10*3/uL (ref 3.4–10.8)

## 2020-12-29 LAB — LIPID PANEL
Chol/HDL Ratio: 3.3 ratio (ref 0.0–4.4)
Cholesterol, Total: 239 mg/dL — ABNORMAL HIGH (ref 100–199)
HDL: 72 mg/dL (ref >39)
LDL Chol Calc (NIH): 151 mg/dL — ABNORMAL HIGH (ref 0–99)
Triglycerides: 93 mg/dL (ref 0–149)
VLDL Cholesterol Cal: 16 mg/dL (ref 5–40)

## 2020-12-29 LAB — IRON,TIBC AND FERRITIN PANEL
Ferritin: 697 ng/mL — ABNORMAL HIGH (ref 15–150)
Iron Saturation: 21 % (ref 15–55)
Iron: 43 ug/dL (ref 27–159)
Total Iron Binding Capacity: 206 ug/dL — ABNORMAL LOW (ref 250–450)
UIBC: 163 ug/dL (ref 131–425)

## 2020-12-29 LAB — MICROALBUMIN / CREATININE URINE RATIO
Creatinine, Urine: 40 mg/dL
Microalb/Creat Ratio: 1554 mg/g creat — ABNORMAL HIGH (ref 0–29)
Microalbumin, Urine: 621.7 ug/mL

## 2020-12-30 ENCOUNTER — Other Ambulatory Visit: Payer: Self-pay | Admitting: Internal Medicine

## 2020-12-30 MED ORDER — LEVOTHYROXINE SODIUM 200 MCG PO TABS
ORAL_TABLET | ORAL | 3 refills | Status: DC
Start: 2020-12-30 — End: 2021-06-01

## 2020-12-30 NOTE — Progress Notes (Signed)
Let patient know that I have sent her lab results with explanation and recommendations based on them to her MyChart account. In particular I recommend restarting levothyroxine 200 mcg daily due to her thyroid level being abnormal. I have sent that prescription to [facility]. Her hemoglobin level is 8. She should let her nephrologist know to see whether she is due for another iron infusion treatment.

## 2020-12-31 ENCOUNTER — Other Ambulatory Visit: Payer: Self-pay

## 2020-12-31 ENCOUNTER — Other Ambulatory Visit: Payer: Self-pay | Admitting: Neurology

## 2020-12-31 DIAGNOSIS — G43709 Chronic migraine without aura, not intractable, without status migrainosus: Secondary | ICD-10-CM

## 2020-12-31 MED ORDER — NURTEC 75 MG PO TBDP: 75.0000 mg | ORAL_TABLET | Freq: Every day | ORAL | 11 refills | Status: DC | PRN

## 2020-12-31 NOTE — Patient Outreach (Signed)
Patient compliant on medications, refills or 01/05/21  Changes are Levothyroxine 259mcg  Will f/u in 30 days for further refills

## 2021-01-05 ENCOUNTER — Other Ambulatory Visit: Payer: Self-pay | Admitting: Neurology

## 2021-01-05 DIAGNOSIS — G43709 Chronic migraine without aura, not intractable, without status migrainosus: Secondary | ICD-10-CM

## 2021-01-05 MED ORDER — EMGALITY 120 MG/ML ~~LOC~~ SOAJ: 120.0000 mg | SUBCUTANEOUS | 11 refills | Status: DC

## 2021-01-05 MED ORDER — NURTEC 75 MG PO TBDP: 75.0000 mg | ORAL_TABLET | Freq: Every day | ORAL | 11 refills | Status: DC | PRN

## 2021-01-13 ENCOUNTER — Encounter: Payer: Self-pay | Admitting: Internal Medicine

## 2021-01-14 ENCOUNTER — Emergency Department (HOSPITAL_COMMUNITY)
Admission: EM | Admit: 2021-01-14 | Discharge: 2021-01-15 | Disposition: A | Discharge status: Home / Self Care | Payer: Medicaid Other | Attending: Emergency Medicine | Admitting: Emergency Medicine

## 2021-01-14 ENCOUNTER — Other Ambulatory Visit: Payer: Self-pay

## 2021-01-14 ENCOUNTER — Ambulatory Visit: Payer: Medicaid Other | Admitting: Internal Medicine

## 2021-01-14 ENCOUNTER — Emergency Department (HOSPITAL_COMMUNITY): Payer: Medicaid Other

## 2021-01-14 DIAGNOSIS — R21 Rash and other nonspecific skin eruption: Secondary | ICD-10-CM | POA: Diagnosis not present

## 2021-01-14 DIAGNOSIS — N183 Chronic kidney disease, stage 3 unspecified: Secondary | ICD-10-CM | POA: Insufficient documentation

## 2021-01-14 DIAGNOSIS — Z79899 Other long term (current) drug therapy: Secondary | ICD-10-CM | POA: Insufficient documentation

## 2021-01-14 DIAGNOSIS — R0602 Shortness of breath: Secondary | ICD-10-CM | POA: Diagnosis not present

## 2021-01-14 DIAGNOSIS — U071 COVID-19: Secondary | ICD-10-CM | POA: Insufficient documentation

## 2021-01-14 DIAGNOSIS — E10649 Type 1 diabetes mellitus with hypoglycemia without coma: Secondary | ICD-10-CM | POA: Insufficient documentation

## 2021-01-14 DIAGNOSIS — Z794 Long term (current) use of insulin: Secondary | ICD-10-CM | POA: Insufficient documentation

## 2021-01-14 DIAGNOSIS — I129 Hypertensive chronic kidney disease with stage 1 through stage 4 chronic kidney disease, or unspecified chronic kidney disease: Secondary | ICD-10-CM | POA: Insufficient documentation

## 2021-01-14 DIAGNOSIS — E10319 Type 1 diabetes mellitus with unspecified diabetic retinopathy without macular edema: Secondary | ICD-10-CM | POA: Insufficient documentation

## 2021-01-14 DIAGNOSIS — E039 Hypothyroidism, unspecified: Secondary | ICD-10-CM | POA: Insufficient documentation

## 2021-01-14 DIAGNOSIS — R059 Cough, unspecified: Secondary | ICD-10-CM | POA: Diagnosis present

## 2021-01-14 DIAGNOSIS — J45909 Unspecified asthma, uncomplicated: Secondary | ICD-10-CM | POA: Diagnosis not present

## 2021-01-14 DIAGNOSIS — E1040 Type 1 diabetes mellitus with diabetic neuropathy, unspecified: Secondary | ICD-10-CM | POA: Insufficient documentation

## 2021-01-14 DIAGNOSIS — Z87891 Personal history of nicotine dependence: Secondary | ICD-10-CM | POA: Insufficient documentation

## 2021-01-14 DIAGNOSIS — E1043 Type 1 diabetes mellitus with diabetic autonomic (poly)neuropathy: Secondary | ICD-10-CM | POA: Insufficient documentation

## 2021-01-14 DIAGNOSIS — N189 Chronic kidney disease, unspecified: Secondary | ICD-10-CM

## 2021-01-14 LAB — CBC
HCT: 26.3 % — ABNORMAL LOW (ref 36.0–46.0)
Hemoglobin: 8.2 g/dL — ABNORMAL LOW (ref 12.0–15.0)
MCH: 28.2 pg (ref 26.0–34.0)
MCHC: 31.2 g/dL (ref 30.0–36.0)
MCV: 90.4 fL (ref 80.0–100.0)
Platelets: 134 10*3/uL — ABNORMAL LOW (ref 150–400)
RBC: 2.91 MIL/uL — ABNORMAL LOW (ref 3.87–5.11)
RDW: 12.2 % (ref 11.5–15.5)
WBC: 3.5 10*3/uL — ABNORMAL LOW (ref 4.0–10.5)
nRBC: 0 % (ref 0.0–0.2)

## 2021-01-14 LAB — CBG MONITORING, ED
Glucose-Capillary: 73 mg/dL (ref 70–99)
Glucose-Capillary: 209 mg/dL — ABNORMAL HIGH (ref 70–99)

## 2021-01-14 LAB — BASIC METABOLIC PANEL
Anion gap: 13 (ref 5–15)
BUN: 35 mg/dL — ABNORMAL HIGH (ref 6–20)
CO2: 22 mmol/L (ref 22–32)
Calcium: 9.8 mg/dL (ref 8.9–10.3)
Chloride: 100 mmol/L (ref 98–111)
Creatinine, Ser: 6.96 mg/dL — ABNORMAL HIGH (ref 0.44–1.00)
GFR, Estimated: 7 mL/min — ABNORMAL LOW (ref >60)
Glucose, Bld: 102 mg/dL — ABNORMAL HIGH (ref 70–99)
Potassium: 3.3 mmol/L — ABNORMAL LOW (ref 3.5–5.1)
Sodium: 135 mmol/L (ref 135–145)

## 2021-01-14 LAB — SARS CORONAVIRUS 2 BY RT PCR (HOSPITAL ORDER, PERFORMED IN ~~LOC~~ HOSPITAL LAB): SARS Coronavirus 2: POSITIVE — AB

## 2021-01-14 NOTE — ED Triage Notes (Signed)
Pt reports fever four days ago and then general malaise with nasal congestion and shob since then. Advised by her nephrologist to come to ED, as she thinks it might be time for her to start dialysis for the first time.

## 2021-01-15 ENCOUNTER — Other Ambulatory Visit (HOSPITAL_COMMUNITY): Payer: Self-pay

## 2021-01-15 ENCOUNTER — Other Ambulatory Visit (HOSPITAL_COMMUNITY): Payer: Self-pay | Admitting: Family

## 2021-01-15 ENCOUNTER — Ambulatory Visit (HOSPITAL_COMMUNITY)
Admission: RE | Admit: 2021-01-15 | Discharge: 2021-01-15 | Disposition: A | Discharge status: Home / Self Care | Payer: Medicaid Other | Source: Ambulatory Visit | Attending: Pulmonary Disease | Admitting: Pulmonary Disease

## 2021-01-15 DIAGNOSIS — U071 COVID-19: Secondary | ICD-10-CM | POA: Insufficient documentation

## 2021-01-15 DIAGNOSIS — N183 Chronic kidney disease, stage 3 unspecified: Secondary | ICD-10-CM | POA: Diagnosis not present

## 2021-01-15 DIAGNOSIS — I129 Hypertensive chronic kidney disease with stage 1 through stage 4 chronic kidney disease, or unspecified chronic kidney disease: Secondary | ICD-10-CM | POA: Diagnosis not present

## 2021-01-15 MED ORDER — EPINEPHRINE 0.3 MG/0.3ML IJ SOAJ: 0.3000 mg | Freq: Once | INTRAMUSCULAR | Status: DC | PRN

## 2021-01-15 MED ORDER — SOTROVIMAB 500 MG/8ML IV SOLN
500.0000 mg | Freq: Once | INTRAVENOUS | Status: AC
  Administered 2021-01-15: 500 mg via INTRAVENOUS

## 2021-01-15 MED ORDER — SODIUM CHLORIDE 0.9 % IV SOLN: 100.0000 mg | Freq: Every day | INTRAVENOUS | Status: DC

## 2021-01-15 MED ORDER — METHYLPREDNISOLONE SODIUM SUCC 125 MG IJ SOLR: 125.0000 mg | Freq: Once | INTRAMUSCULAR | Status: DC | PRN

## 2021-01-15 MED ORDER — LOSARTAN POTASSIUM 50 MG PO TABS
50.0000 mg | ORAL_TABLET | Freq: Every day | ORAL | Status: DC
  Administered 2021-01-15: 50 mg via ORAL
  Filled 2021-01-15: qty 1

## 2021-01-15 MED ORDER — SODIUM CHLORIDE 0.9 % IV SOLN
200.0000 mg | Freq: Once | INTRAVENOUS | Status: AC
  Administered 2021-01-15: 200 mg via INTRAVENOUS
  Filled 2021-01-15: qty 200

## 2021-01-15 MED ORDER — DIPHENHYDRAMINE HCL 50 MG/ML IJ SOLN: 50.0000 mg | Freq: Once | INTRAMUSCULAR | Status: DC | PRN

## 2021-01-15 MED ORDER — FAMOTIDINE IN NACL 20-0.9 MG/50ML-% IV SOLN: 20.0000 mg | Freq: Once | INTRAVENOUS | Status: DC | PRN

## 2021-01-15 MED ORDER — SODIUM CHLORIDE 0.9 % IV SOLN: INTRAVENOUS | Status: DC | PRN

## 2021-01-15 MED ORDER — ONDANSETRON HCL 4 MG/2ML IJ SOLN
4.0000 mg | Freq: Once | INTRAMUSCULAR | Status: AC
  Administered 2021-01-15: 4 mg via INTRAVENOUS
  Filled 2021-01-15: qty 2

## 2021-01-15 MED ORDER — HYDRALAZINE HCL 25 MG PO TABS
100.0000 mg | ORAL_TABLET | Freq: Three times a day (TID) | ORAL | Status: DC
  Administered 2021-01-15: 100 mg via ORAL
  Filled 2021-01-15: qty 4

## 2021-01-15 MED ORDER — ALBUTEROL SULFATE HFA 108 (90 BASE) MCG/ACT IN AERS: 2.0000 | INHALATION_SPRAY | Freq: Once | RESPIRATORY_TRACT | Status: DC | PRN

## 2021-01-15 NOTE — Discharge Instructions (Signed)

## 2021-01-15 NOTE — ED Notes (Signed)
Patient verbalized understanding of discharge instructions. Opportunity for questions and answers.  
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
## 2021-01-15 NOTE — Progress Notes (Signed)
Diagnosis: COVID-19  Physician: Dr. Patrick Wright  Procedure: [facility] Infusion [facility] Med: Sotrovimab infusion - Provided patient with sotrovimab fact sheet for patients, parents, and caregivers prior to infusion.   Complications: No immediate complications noted  Discharge: Discharged home    

## 2021-01-15 NOTE — Progress Notes (Deleted)
Patient reviewed Fact Sheet for Patients, Parents, and Caregivers for Emergency Use Authorization (EUA) of sotrovimab for the Treatment of Coronavirus. Patient also reviewed and is agreeable to the estimated cost of treatment. Patient is agreeable to proceed.   

## 2021-01-15 NOTE — Progress Notes (Signed)
I connected by phone with Angela [name] on 01/15/2021 at 10:58 AM to discuss the potential use of a new treatment for mild to moderate COVID-19 viral infection in non-hospitalized patients.  This patient is a 35 y.o. female that meets the FDA criteria for Emergency Use Authorization of COVID monoclonal antibody sotrovimab.  Has a (+) direct SARS-CoV-2 viral test result  Has mild or moderate COVID-19   Is NOT hospitalized due to COVID-19  Is within 10 days of symptom onset  Has at least one of the high risk factor(s) for progression to severe COVID-19 and/or hospitalization as defined in EUA.  Specific high risk criteria : Chronic Kidney Disease (CKD), Diabetes and Cardiovascular disease or hypertension   Symptoms of SOB, congestion, aches began 01/10/2021.   I have spoken and communicated the following to the patient or parent/caregiver regarding COVID monoclonal antibody treatment:  1. FDA has authorized the emergency use for the treatment of mild to moderate COVID-19 in adults and pediatric patients with positive results of direct SARS-CoV-2 viral testing who are 26 years of age and older weighing at least 40 kg, and who are at high risk for progressing to severe COVID-19 and/or hospitalization.  2. The significant known and potential risks and benefits of COVID monoclonal antibody, and the extent to which such potential risks and benefits are unknown.  3. Information on available alternative treatments and the risks and benefits of those alternatives, including clinical trials.  4. Patients treated with COVID monoclonal antibody should continue to self-isolate and use infection control measures (e.g., wear mask, isolate, social distance, avoid sharing personal items, clean and disinfect "high touch" surfaces, and frequent handwashing) according to CDC guidelines.   5. The patient or parent/caregiver has the option to accept or refuse COVID monoclonal antibody treatment.  After  reviewing this information with the patient, the patient has agreed to receive one of the available covid 19 monoclonal antibodies and will be provided an appropriate fact sheet prior to infusion. Asencion Gowda, NP 01/15/2021 10:58 AM

## 2021-01-15 NOTE — Progress Notes (Signed)
Patient reviewed Fact Sheet for Patients, Parents, and Caregivers for Emergency Use Authorization (EUA) of sotrovimab for the Treatment of Coronavirus. Patient also reviewed and is agreeable to the estimated cost of treatment. Patient is agreeable to proceed.   

## 2021-01-15 NOTE — ED Notes (Signed)
Md notified of pts hypertension

## 2021-01-25 IMAGING — CR DG CHEST 2V
2 series · 2 of 2 positions shown · non-contrast
Comparison: 02/21/2020

CLINICAL DATA: Chest pain

EXAM:
CHEST - 2 VIEW

[w chest pa]
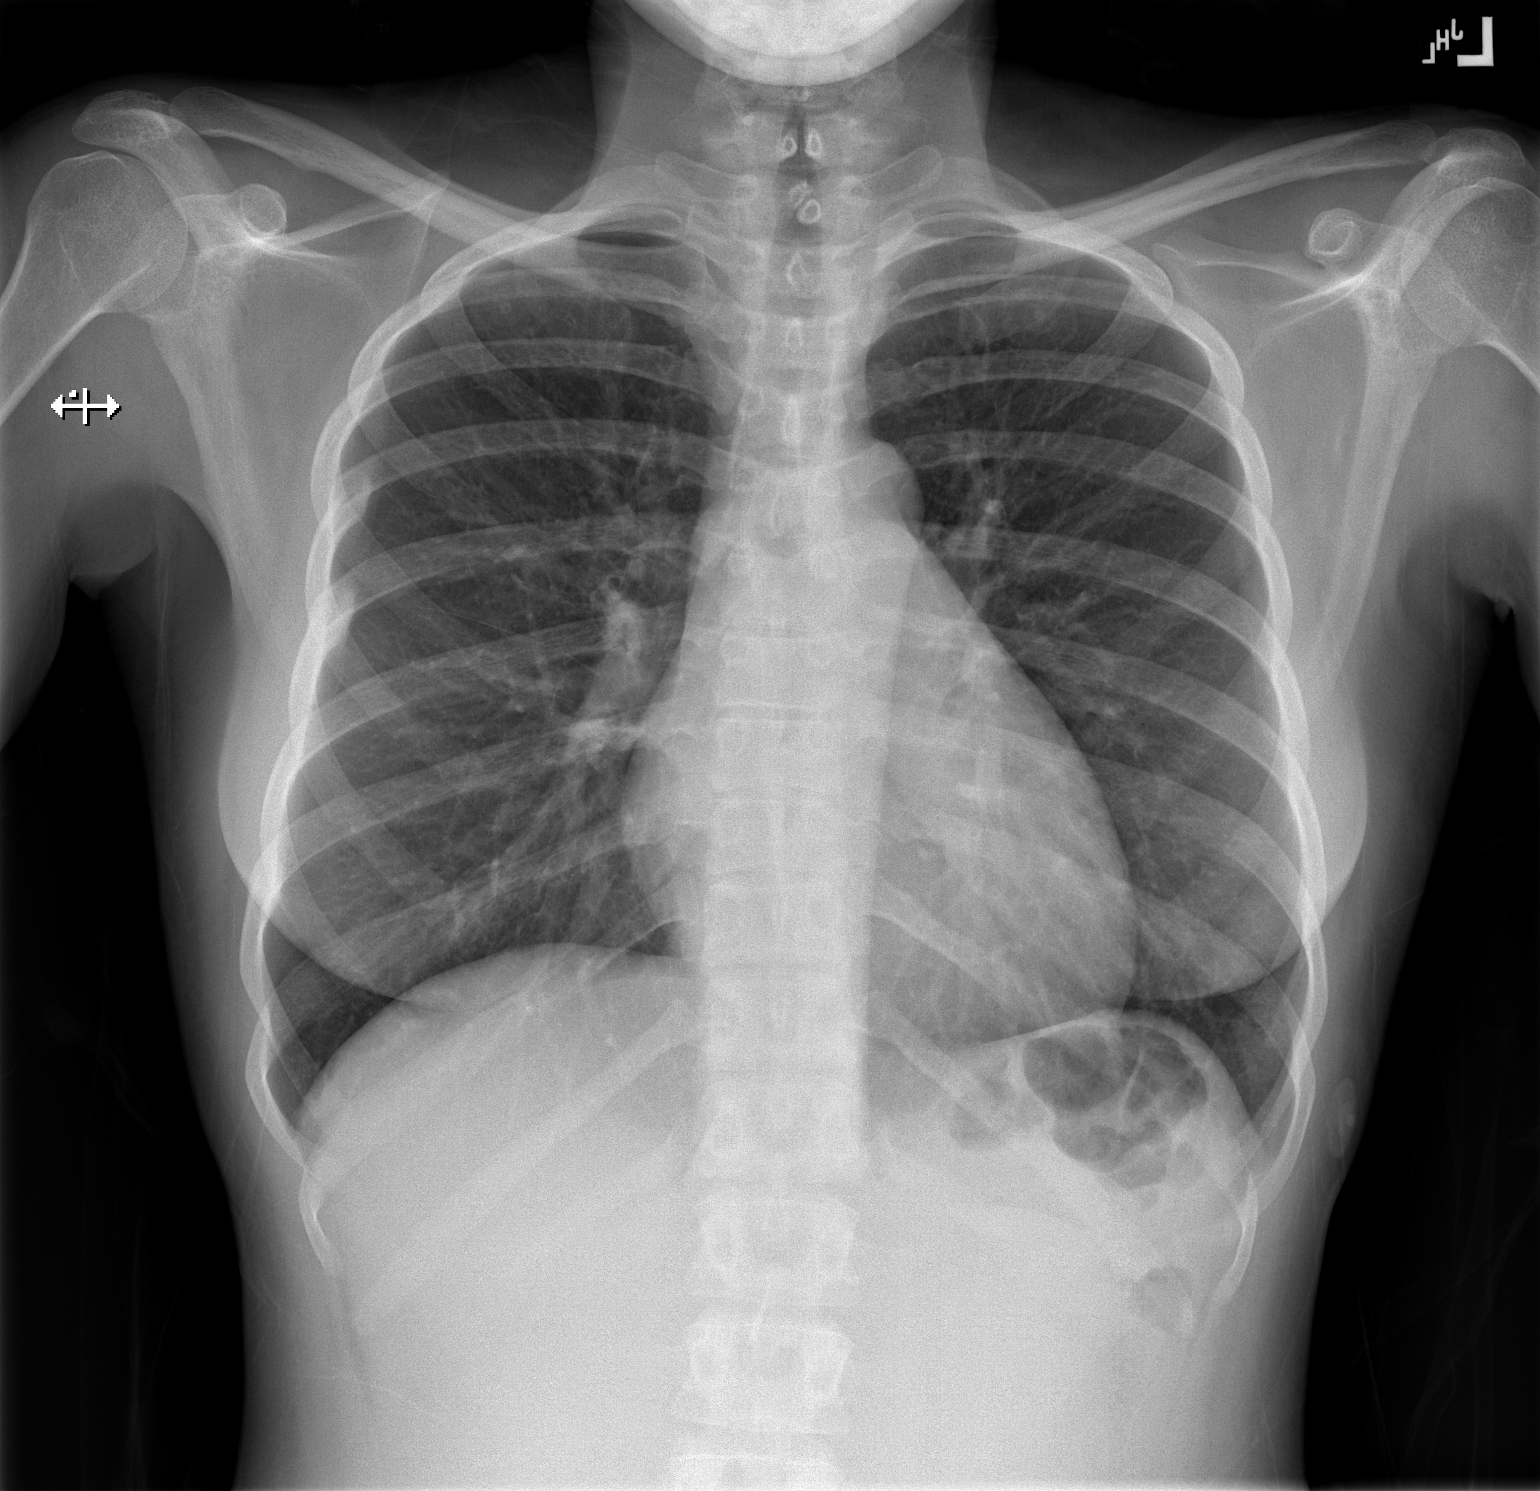

[w chest lat]
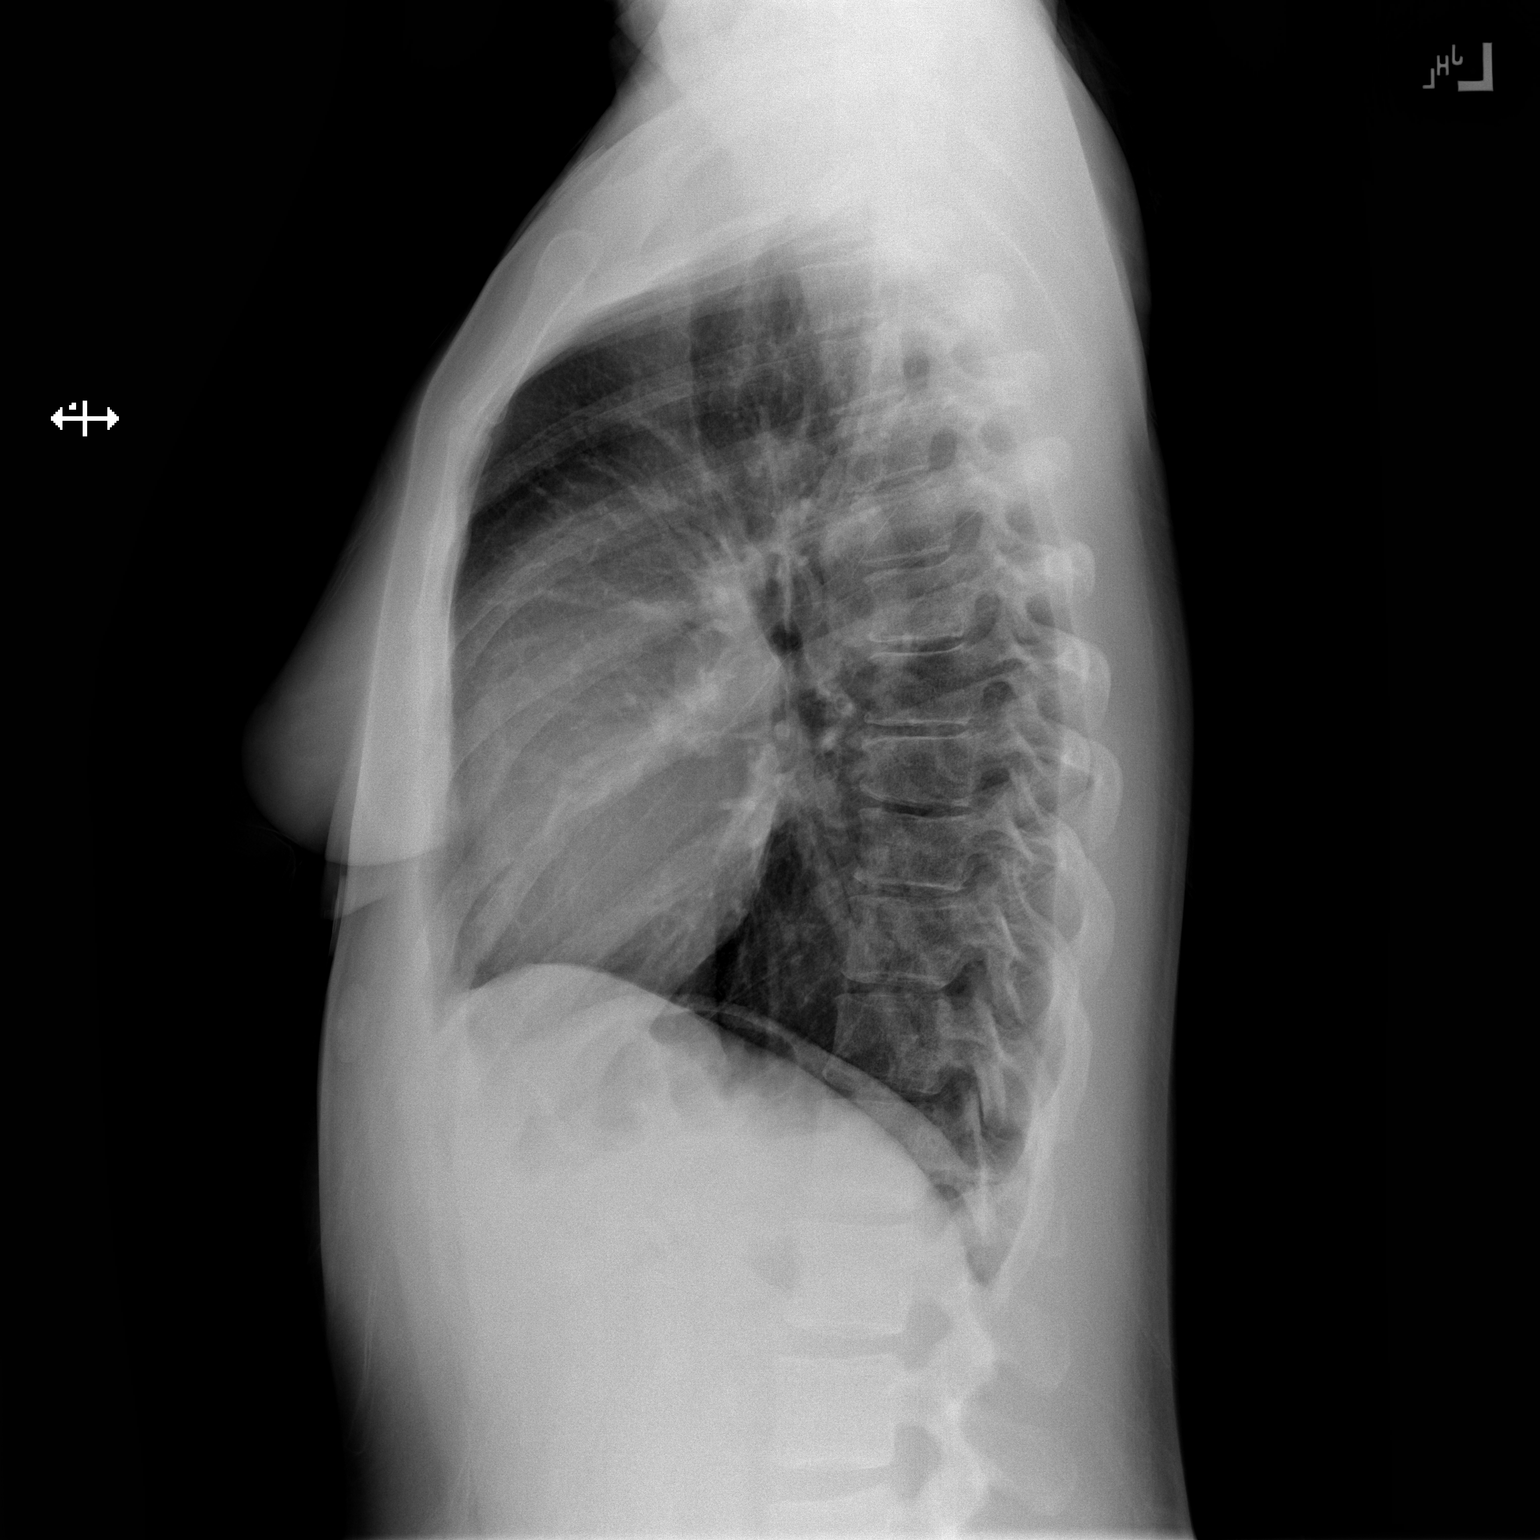

[2 of 2 positions shown; findings below may reference images not displayed]

FINDINGS: The heart size and mediastinal contours are within normal limits.
Both lungs are clear. The visualized skeletal structures are
unremarkable.
IMPRESSION: No active cardiopulmonary disease.
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
## 2021-02-08 ENCOUNTER — Encounter (INDEPENDENT_AMBULATORY_CARE_PROVIDER_SITE_OTHER): Payer: Medicaid Other | Admitting: Ophthalmology

## 2021-02-08 DIAGNOSIS — I10 Essential (primary) hypertension: Secondary | ICD-10-CM

## 2021-02-08 DIAGNOSIS — H3581 Retinal edema: Secondary | ICD-10-CM

## 2021-02-08 DIAGNOSIS — E103513 Type 1 diabetes mellitus with proliferative diabetic retinopathy with macular edema, bilateral: Secondary | ICD-10-CM

## 2021-02-08 DIAGNOSIS — H35713 Central serous chorioretinopathy, bilateral: Secondary | ICD-10-CM

## 2021-02-08 DIAGNOSIS — H269 Unspecified cataract: Secondary | ICD-10-CM

## 2021-02-08 DIAGNOSIS — H35033 Hypertensive retinopathy, bilateral: Secondary | ICD-10-CM

## 2021-02-09 ENCOUNTER — Encounter (INDEPENDENT_AMBULATORY_CARE_PROVIDER_SITE_OTHER): Payer: Medicaid Other | Admitting: Ophthalmology

## 2021-02-09 ENCOUNTER — Other Ambulatory Visit: Payer: Self-pay | Admitting: Internal Medicine

## 2021-02-09 DIAGNOSIS — E1029 Type 1 diabetes mellitus with other diabetic kidney complication: Secondary | ICD-10-CM

## 2021-03-02 DIAGNOSIS — E039 Hypothyroidism, unspecified: Secondary | ICD-10-CM | POA: Diagnosis not present

## 2021-03-02 DIAGNOSIS — I120 Hypertensive chronic kidney disease with stage 5 chronic kidney disease or end stage renal disease: Secondary | ICD-10-CM | POA: Diagnosis not present

## 2021-03-02 DIAGNOSIS — E1022 Type 1 diabetes mellitus with diabetic chronic kidney disease: Secondary | ICD-10-CM | POA: Diagnosis not present

## 2021-03-02 DIAGNOSIS — D631 Anemia in chronic kidney disease: Secondary | ICD-10-CM | POA: Diagnosis not present

## 2021-03-02 DIAGNOSIS — N185 Chronic kidney disease, stage 5: Secondary | ICD-10-CM | POA: Diagnosis not present

## 2021-03-02 DIAGNOSIS — D509 Iron deficiency anemia, unspecified: Secondary | ICD-10-CM | POA: Diagnosis not present

## 2021-03-03 ENCOUNTER — Other Ambulatory Visit: Payer: Self-pay | Admitting: Psychiatry

## 2021-03-03 ENCOUNTER — Other Ambulatory Visit: Payer: Self-pay | Admitting: Internal Medicine

## 2021-03-03 DIAGNOSIS — F418 Other specified anxiety disorders: Secondary | ICD-10-CM

## 2021-03-03 DIAGNOSIS — F331 Major depressive disorder, recurrent, moderate: Secondary | ICD-10-CM

## 2021-03-03 NOTE — Telephone Encounter (Signed)
Requested medication (s) are due for refill today:   Provider to review  Requested medication (s) are on the active medication list:   Yes  Future visit scheduled:   No   Last ordered: 11/10/2020 #30, 1 refill  Clinic note:  Returned because it's a non delegated refill.    Requested Prescriptions  Pending Prescriptions Disp Refills   ondansetron (ZOFRAN-ODT) 4 MG disintegrating tablet [Pharmacy Med Name: ondansetron 4 mg disintegrating tablet] 30 tablet 0    Sig: DISSOLVE ONE TABLET ON TONGUE EVERY 8 HOURS AS NEEDED      Not Delegated - Gastroenterology: Antiemetics Failed - 03/03/2021 12:58 PM      Failed - This refill cannot be delegated      Passed - Valid encounter within last 6 months    Recent Outpatient Visits           2 months ago Other insomnia   [place], Neoma Laming B, MD   3 months ago 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, with long-term current use of insulin ([place])   [place], [name], MD   9 months ago 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, with long-term current use of insulin ([place])   [place] [name], MD   1 year ago Chest pain, unspecified type   [place], [place] J, NP   1 year ago Migraine without aura and without status migrainosus, not intractable   [facility] [name], MD

## 2021-03-04 ENCOUNTER — Other Ambulatory Visit: Payer: Self-pay

## 2021-03-04 ENCOUNTER — Other Ambulatory Visit: Payer: Medicaid Other

## 2021-03-04 NOTE — Patient Outreach (Signed)
Called Angela [name] at the provider's office [phone]) said that they won't fill escitalopram until they evaluate her. Alerted [facility] to let them know

## 2021-03-11 DIAGNOSIS — T82590A Other mechanical complication of surgically created arteriovenous fistula, initial encounter: Secondary | ICD-10-CM | POA: Diagnosis not present

## 2021-03-11 DIAGNOSIS — Z992 Dependence on renal dialysis: Secondary | ICD-10-CM | POA: Diagnosis not present

## 2021-03-11 DIAGNOSIS — D631 Anemia in chronic kidney disease: Secondary | ICD-10-CM | POA: Diagnosis not present

## 2021-03-11 DIAGNOSIS — N186 End stage renal disease: Secondary | ICD-10-CM | POA: Diagnosis not present

## 2021-03-13 DIAGNOSIS — D631 Anemia in chronic kidney disease: Secondary | ICD-10-CM | POA: Diagnosis not present

## 2021-03-13 DIAGNOSIS — N186 End stage renal disease: Secondary | ICD-10-CM | POA: Diagnosis not present

## 2021-03-16 DIAGNOSIS — D631 Anemia in chronic kidney disease: Secondary | ICD-10-CM | POA: Diagnosis not present

## 2021-03-16 DIAGNOSIS — N186 End stage renal disease: Secondary | ICD-10-CM | POA: Diagnosis not present

## 2021-03-18 DIAGNOSIS — D631 Anemia in chronic kidney disease: Secondary | ICD-10-CM | POA: Diagnosis not present

## 2021-03-18 DIAGNOSIS — Z992 Dependence on renal dialysis: Secondary | ICD-10-CM | POA: Diagnosis not present

## 2021-03-18 DIAGNOSIS — N186 End stage renal disease: Secondary | ICD-10-CM | POA: Diagnosis not present

## 2021-03-19 ENCOUNTER — Other Ambulatory Visit: Payer: Self-pay | Admitting: *Deleted

## 2021-03-19 NOTE — Patient Outreach (Signed)
Care Coordination  03/19/2021  Angela [name] 04/20/1986 [id number]    Medicaid Managed Care   Unsuccessful Outreach Note  03/19/2021 Name: Angela [name] MRN: [id number] DOB: 03/02/1986  Referred by: [name], MD Reason for referral : High Risk Managed Medicaid (Unsuccessful RNCM follow up outreach)   A second unsuccessful telephone outreach was attempted today. The patient was referred to the case management team for assistance with care management and care coordination.   Follow Up Plan: A HIPAA compliant phone message was left for the patient providing contact information and requesting a return call.  The care management team will reach out to the patient again over the next 7-14 days.   Lurena Joiner RN, BSN [place]  [employer] manager

## 2021-03-19 NOTE — Patient Instructions (Signed)
Visit Information  [name]  - as a part of your Medicaid benefit, you are eligible for care management and care coordination services at no cost or copay. I was unable to reach you by phone today but would be happy to help you with your health related needs. Please feel free to call me @ [phone].   A member of the Managed Medicaid care management team will reach out to you again over the next 7-14 days.   Lurena Joiner RN, BSN [place]  [employer] manager

## 2021-03-20 DIAGNOSIS — E785 Hyperlipidemia, unspecified: Secondary | ICD-10-CM | POA: Diagnosis not present

## 2021-03-20 DIAGNOSIS — N186 End stage renal disease: Secondary | ICD-10-CM | POA: Diagnosis not present

## 2021-03-20 DIAGNOSIS — N2581 Secondary hyperparathyroidism of renal origin: Secondary | ICD-10-CM | POA: Diagnosis not present

## 2021-03-20 DIAGNOSIS — Z23 Encounter for immunization: Secondary | ICD-10-CM | POA: Diagnosis not present

## 2021-03-20 DIAGNOSIS — D631 Anemia in chronic kidney disease: Secondary | ICD-10-CM | POA: Diagnosis not present

## 2021-03-20 DIAGNOSIS — D509 Iron deficiency anemia, unspecified: Secondary | ICD-10-CM | POA: Diagnosis not present

## 2021-03-22 DIAGNOSIS — Z992 Dependence on renal dialysis: Secondary | ICD-10-CM | POA: Diagnosis not present

## 2021-03-22 DIAGNOSIS — I82711 Chronic embolism and thrombosis of superficial veins of right upper extremity: Secondary | ICD-10-CM | POA: Diagnosis not present

## 2021-03-22 DIAGNOSIS — T82858A Stenosis of vascular prosthetic devices, implants and grafts, initial encounter: Secondary | ICD-10-CM | POA: Diagnosis not present

## 2021-03-22 DIAGNOSIS — N186 End stage renal disease: Secondary | ICD-10-CM | POA: Diagnosis not present

## 2021-03-22 DIAGNOSIS — R0989 Other specified symptoms and signs involving the circulatory and respiratory systems: Secondary | ICD-10-CM | POA: Diagnosis not present

## 2021-03-22 DIAGNOSIS — Z01810 Encounter for preprocedural cardiovascular examination: Secondary | ICD-10-CM | POA: Diagnosis not present

## 2021-03-23 ENCOUNTER — Other Ambulatory Visit: Payer: Self-pay | Admitting: Internal Medicine

## 2021-03-23 DIAGNOSIS — N2581 Secondary hyperparathyroidism of renal origin: Secondary | ICD-10-CM | POA: Diagnosis not present

## 2021-03-23 DIAGNOSIS — D509 Iron deficiency anemia, unspecified: Secondary | ICD-10-CM | POA: Diagnosis not present

## 2021-03-23 DIAGNOSIS — Z23 Encounter for immunization: Secondary | ICD-10-CM | POA: Diagnosis not present

## 2021-03-23 DIAGNOSIS — N186 End stage renal disease: Secondary | ICD-10-CM | POA: Diagnosis not present

## 2021-03-23 DIAGNOSIS — D631 Anemia in chronic kidney disease: Secondary | ICD-10-CM | POA: Diagnosis not present

## 2021-03-23 DIAGNOSIS — E785 Hyperlipidemia, unspecified: Secondary | ICD-10-CM | POA: Diagnosis not present

## 2021-03-23 DIAGNOSIS — I10 Essential (primary) hypertension: Secondary | ICD-10-CM

## 2021-03-24 ENCOUNTER — Other Ambulatory Visit: Payer: Self-pay

## 2021-03-24 ENCOUNTER — Ambulatory Visit: Payer: Medicaid Other | Attending: Physician Assistant | Admitting: Physician Assistant

## 2021-03-24 DIAGNOSIS — E1029 Type 1 diabetes mellitus with other diabetic kidney complication: Secondary | ICD-10-CM

## 2021-03-24 DIAGNOSIS — L601 Onycholysis: Secondary | ICD-10-CM

## 2021-03-24 MED ORDER — MUPIROCIN 2 % EX OINT
1.0000 | TOPICAL_OINTMENT | Freq: Two times a day (BID) | CUTANEOUS | 0 refills | Status: DC
Start: 2021-03-24 — End: 2021-06-19

## 2021-03-24 MED ORDER — DOXYCYCLINE HYCLATE 100 MG PO TABS: 100.0000 mg | ORAL_TABLET | Freq: Two times a day (BID) | ORAL | 0 refills | Status: DC

## 2021-03-24 NOTE — Progress Notes (Signed)
Virtual Visit via Telephone Note  I connected with Angela [name] on 03/24/21 at  1:30 PM EDT by telephone and verified that I am speaking with the correct person using two identifiers.  Location: Patient: home Provider: [facility] office   I discussed the limitations, risks, security and privacy concerns of performing an evaluation and management service by telephone and the availability of in person appointments. I also discussed with the patient that there may be a patient responsible charge related to this service. The patient expressed understanding and agreed to proceed.  History of Present Illness:  Ripped her L thumb nail off and feels as though it is getting infected.  The patient was just put on my schedule today and given this virtual appt bc it was the only appt open.  She is compliant with dialysis.  Blood sugars up to 185.  Doing well otherwise.  No fever.      Observations/Objective:  NAD.  A&Ox3   Assessment and Plan: 1. Onycholysis To ED/UC if worsens at all for risk felon/paronyychia/abscess.  Patient verbalizes understanding - doxycycline (VIBRA-TABS) 100 MG tablet; Take 1 tablet (100 mg total) by mouth 2 (two) times daily.  Dispense: 20 tablet; Refill: 0 - mupirocin ointment (BACTROBAN) 2 %; Apply 1 application topically 2 (two) times daily. X 1week  Dispense: 22 g; Refill: 0 - Ambulatory referral to Hand Surgery  2. 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, with long-term current use of insulin (HCC) Improving control bc highest glucose ~175 at home.  Continue medications and glucose checking.    Follow Up Instructions: See DR Wynetta Emery within 1 month for chronic conditions   I discussed the assessment and treatment plan with the patient. The patient was provided an opportunity to ask questions and all were answered. The patient agreed with the plan and demonstrated an understanding of the instructions.   The patient was advised to call back or seek an in-person  evaluation if the symptoms worsen or if the condition fails to improve as anticipated.  I provided 11 minutes of non-face-to-face time during this encounter.   Freeman Caldron, PA-C  Patient ID: Angela [name], female   DOB: Sep 15, 1986, 35 y.o.   MRN: [id number]

## 2021-03-25 ENCOUNTER — Other Ambulatory Visit: Payer: Medicaid Other

## 2021-03-25 DIAGNOSIS — Z23 Encounter for immunization: Secondary | ICD-10-CM | POA: Diagnosis not present

## 2021-03-25 DIAGNOSIS — N2581 Secondary hyperparathyroidism of renal origin: Secondary | ICD-10-CM | POA: Diagnosis not present

## 2021-03-25 DIAGNOSIS — D509 Iron deficiency anemia, unspecified: Secondary | ICD-10-CM | POA: Diagnosis not present

## 2021-03-25 DIAGNOSIS — N186 End stage renal disease: Secondary | ICD-10-CM | POA: Diagnosis not present

## 2021-03-25 DIAGNOSIS — E785 Hyperlipidemia, unspecified: Secondary | ICD-10-CM | POA: Diagnosis not present

## 2021-03-25 DIAGNOSIS — D631 Anemia in chronic kidney disease: Secondary | ICD-10-CM | POA: Diagnosis not present

## 2021-03-27 DIAGNOSIS — N2581 Secondary hyperparathyroidism of renal origin: Secondary | ICD-10-CM | POA: Diagnosis not present

## 2021-03-27 DIAGNOSIS — N186 End stage renal disease: Secondary | ICD-10-CM | POA: Diagnosis not present

## 2021-03-27 DIAGNOSIS — D509 Iron deficiency anemia, unspecified: Secondary | ICD-10-CM | POA: Diagnosis not present

## 2021-03-27 DIAGNOSIS — Z23 Encounter for immunization: Secondary | ICD-10-CM | POA: Diagnosis not present

## 2021-03-27 DIAGNOSIS — E785 Hyperlipidemia, unspecified: Secondary | ICD-10-CM | POA: Diagnosis not present

## 2021-03-27 DIAGNOSIS — D631 Anemia in chronic kidney disease: Secondary | ICD-10-CM | POA: Diagnosis not present

## 2021-03-29 NOTE — Patient Outreach (Signed)
Tried to coordinate with patient for medication refills but she never returned my phone call. Will proceed with trying to deliver medications and continue trying to contact patient. F/U 1 Month for subsequent refills

## 2021-03-30 DIAGNOSIS — N186 End stage renal disease: Secondary | ICD-10-CM | POA: Diagnosis not present

## 2021-03-30 DIAGNOSIS — D631 Anemia in chronic kidney disease: Secondary | ICD-10-CM | POA: Diagnosis not present

## 2021-03-30 DIAGNOSIS — E785 Hyperlipidemia, unspecified: Secondary | ICD-10-CM | POA: Diagnosis not present

## 2021-03-30 DIAGNOSIS — N2581 Secondary hyperparathyroidism of renal origin: Secondary | ICD-10-CM | POA: Diagnosis not present

## 2021-03-30 DIAGNOSIS — Z23 Encounter for immunization: Secondary | ICD-10-CM | POA: Diagnosis not present

## 2021-03-30 DIAGNOSIS — D509 Iron deficiency anemia, unspecified: Secondary | ICD-10-CM | POA: Diagnosis not present

## 2021-03-31 ENCOUNTER — Other Ambulatory Visit: Payer: Self-pay | Admitting: Family Medicine

## 2021-03-31 ENCOUNTER — Other Ambulatory Visit: Payer: Self-pay | Admitting: Psychiatry

## 2021-03-31 ENCOUNTER — Other Ambulatory Visit: Payer: Self-pay

## 2021-03-31 ENCOUNTER — Ambulatory Visit (INDEPENDENT_AMBULATORY_CARE_PROVIDER_SITE_OTHER): Payer: Medicaid Other | Admitting: Family Medicine

## 2021-03-31 ENCOUNTER — Ambulatory Visit (INDEPENDENT_AMBULATORY_CARE_PROVIDER_SITE_OTHER): Payer: Medicaid Other

## 2021-03-31 DIAGNOSIS — F418 Other specified anxiety disorders: Secondary | ICD-10-CM

## 2021-03-31 DIAGNOSIS — E1029 Type 1 diabetes mellitus with other diabetic kidney complication: Secondary | ICD-10-CM

## 2021-03-31 DIAGNOSIS — M79645 Pain in left finger(s): Secondary | ICD-10-CM

## 2021-03-31 DIAGNOSIS — F331 Major depressive disorder, recurrent, moderate: Secondary | ICD-10-CM
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
## 2021-04-01 DIAGNOSIS — N2581 Secondary hyperparathyroidism of renal origin: Secondary | ICD-10-CM | POA: Diagnosis not present

## 2021-04-01 DIAGNOSIS — E785 Hyperlipidemia, unspecified: Secondary | ICD-10-CM | POA: Diagnosis not present

## 2021-04-01 DIAGNOSIS — D631 Anemia in chronic kidney disease: Secondary | ICD-10-CM | POA: Diagnosis not present

## 2021-04-01 DIAGNOSIS — N186 End stage renal disease: Secondary | ICD-10-CM | POA: Diagnosis not present

## 2021-04-01 DIAGNOSIS — Z23 Encounter for immunization: Secondary | ICD-10-CM | POA: Diagnosis not present

## 2021-04-01 DIAGNOSIS — D509 Iron deficiency anemia, unspecified: Secondary | ICD-10-CM | POA: Diagnosis not present

## 2021-04-03 DIAGNOSIS — Z23 Encounter for immunization: Secondary | ICD-10-CM | POA: Diagnosis not present

## 2021-04-03 DIAGNOSIS — E785 Hyperlipidemia, unspecified: Secondary | ICD-10-CM | POA: Diagnosis not present

## 2021-04-03 DIAGNOSIS — N186 End stage renal disease: Secondary | ICD-10-CM | POA: Diagnosis not present

## 2021-04-03 DIAGNOSIS — D631 Anemia in chronic kidney disease: Secondary | ICD-10-CM | POA: Diagnosis not present

## 2021-04-03 DIAGNOSIS — D509 Iron deficiency anemia, unspecified: Secondary | ICD-10-CM | POA: Diagnosis not present

## 2021-04-03 DIAGNOSIS — N2581 Secondary hyperparathyroidism of renal origin: Secondary | ICD-10-CM | POA: Diagnosis not present

## 2021-04-06 DIAGNOSIS — D631 Anemia in chronic kidney disease: Secondary | ICD-10-CM | POA: Diagnosis not present

## 2021-04-06 DIAGNOSIS — N2581 Secondary hyperparathyroidism of renal origin: Secondary | ICD-10-CM | POA: Diagnosis not present

## 2021-04-06 DIAGNOSIS — D509 Iron deficiency anemia, unspecified: Secondary | ICD-10-CM | POA: Diagnosis not present

## 2021-04-06 DIAGNOSIS — Z23 Encounter for immunization: Secondary | ICD-10-CM | POA: Diagnosis not present

## 2021-04-06 DIAGNOSIS — E785 Hyperlipidemia, unspecified: Secondary | ICD-10-CM | POA: Diagnosis not present

## 2021-04-06 DIAGNOSIS — N186 End stage renal disease: Secondary | ICD-10-CM | POA: Diagnosis not present

## 2021-04-08 DIAGNOSIS — D631 Anemia in chronic kidney disease: Secondary | ICD-10-CM | POA: Diagnosis not present

## 2021-04-08 DIAGNOSIS — N2581 Secondary hyperparathyroidism of renal origin: Secondary | ICD-10-CM | POA: Diagnosis not present

## 2021-04-08 DIAGNOSIS — E785 Hyperlipidemia, unspecified: Secondary | ICD-10-CM | POA: Diagnosis not present

## 2021-04-08 DIAGNOSIS — D509 Iron deficiency anemia, unspecified: Secondary | ICD-10-CM | POA: Diagnosis not present

## 2021-04-08 DIAGNOSIS — N186 End stage renal disease: Secondary | ICD-10-CM | POA: Diagnosis not present

## 2021-04-08 DIAGNOSIS — Z23 Encounter for immunization: Secondary | ICD-10-CM | POA: Diagnosis not present

## 2021-04-10 DIAGNOSIS — N186 End stage renal disease: Secondary | ICD-10-CM | POA: Diagnosis not present

## 2021-04-10 DIAGNOSIS — E785 Hyperlipidemia, unspecified: Secondary | ICD-10-CM | POA: Diagnosis not present

## 2021-04-10 DIAGNOSIS — D631 Anemia in chronic kidney disease: Secondary | ICD-10-CM | POA: Diagnosis not present

## 2021-04-10 DIAGNOSIS — D509 Iron deficiency anemia, unspecified: Secondary | ICD-10-CM | POA: Diagnosis not present

## 2021-04-10 DIAGNOSIS — Z23 Encounter for immunization: Secondary | ICD-10-CM | POA: Diagnosis not present

## 2021-04-10 DIAGNOSIS — N2581 Secondary hyperparathyroidism of renal origin: Secondary | ICD-10-CM | POA: Diagnosis not present

## 2021-04-12 ENCOUNTER — Telehealth: Payer: Self-pay | Admitting: Internal Medicine

## 2021-04-12 NOTE — Telephone Encounter (Signed)
-----   Message from Argentina Donovan, [place] sent at 03/24/2021  1:35 PM EDT ----- See DR Wynetta Emery within 1 month for chronic conditions

## 2021-04-12 NOTE — Telephone Encounter (Signed)
Pt had appt with [facility] 4/6 and need to get scheduled for a 1 month f.u with Dr. Wynetta Emery. Called pt no answer Left vm to call [phone] to schedule appt with Dr. Wynetta Emery

## 2021-04-13 ENCOUNTER — Ambulatory Visit (HOSPITAL_COMMUNITY): Payer: Self-pay | Admitting: Psychiatry

## 2021-04-13 DIAGNOSIS — N186 End stage renal disease: Secondary | ICD-10-CM | POA: Diagnosis not present

## 2021-04-13 DIAGNOSIS — D631 Anemia in chronic kidney disease: Secondary | ICD-10-CM | POA: Diagnosis not present

## 2021-04-13 DIAGNOSIS — E785 Hyperlipidemia, unspecified: Secondary | ICD-10-CM | POA: Diagnosis not present

## 2021-04-13 DIAGNOSIS — N2581 Secondary hyperparathyroidism of renal origin: Secondary | ICD-10-CM | POA: Diagnosis not present

## 2021-04-13 DIAGNOSIS — D509 Iron deficiency anemia, unspecified: Secondary | ICD-10-CM | POA: Diagnosis not present

## 2021-04-13 DIAGNOSIS — Z23 Encounter for immunization: Secondary | ICD-10-CM | POA: Diagnosis not present

## 2021-04-15 DIAGNOSIS — D631 Anemia in chronic kidney disease: Secondary | ICD-10-CM | POA: Diagnosis not present

## 2021-04-15 DIAGNOSIS — N186 End stage renal disease: Secondary | ICD-10-CM | POA: Diagnosis not present

## 2021-04-15 DIAGNOSIS — N2581 Secondary hyperparathyroidism of renal origin: Secondary | ICD-10-CM | POA: Diagnosis not present

## 2021-04-15 DIAGNOSIS — D509 Iron deficiency anemia, unspecified: Secondary | ICD-10-CM | POA: Diagnosis not present

## 2021-04-15 DIAGNOSIS — E785 Hyperlipidemia, unspecified: Secondary | ICD-10-CM | POA: Diagnosis not present

## 2021-04-15 DIAGNOSIS — Z23 Encounter for immunization: Secondary | ICD-10-CM | POA: Diagnosis not present

## 2021-04-16 DIAGNOSIS — E109 Type 1 diabetes mellitus without complications: Secondary | ICD-10-CM | POA: Diagnosis not present

## 2021-04-17 DIAGNOSIS — D509 Iron deficiency anemia, unspecified: Secondary | ICD-10-CM | POA: Diagnosis not present

## 2021-04-17 DIAGNOSIS — D631 Anemia in chronic kidney disease: Secondary | ICD-10-CM | POA: Diagnosis not present

## 2021-04-17 DIAGNOSIS — N2581 Secondary hyperparathyroidism of renal origin: Secondary | ICD-10-CM | POA: Diagnosis not present

## 2021-04-17 DIAGNOSIS — N186 End stage renal disease: Secondary | ICD-10-CM | POA: Diagnosis not present

## 2021-04-17 DIAGNOSIS — Z992 Dependence on renal dialysis: Secondary | ICD-10-CM | POA: Diagnosis not present

## 2021-04-17 DIAGNOSIS — E785 Hyperlipidemia, unspecified: Secondary | ICD-10-CM | POA: Diagnosis not present

## 2021-04-17 DIAGNOSIS — Z23 Encounter for immunization: Secondary | ICD-10-CM | POA: Diagnosis not present

## 2021-04-20 DIAGNOSIS — Z992 Dependence on renal dialysis: Secondary | ICD-10-CM | POA: Diagnosis not present

## 2021-04-20 DIAGNOSIS — D509 Iron deficiency anemia, unspecified: Secondary | ICD-10-CM | POA: Diagnosis not present

## 2021-04-20 DIAGNOSIS — D631 Anemia in chronic kidney disease: Secondary | ICD-10-CM | POA: Diagnosis not present

## 2021-04-20 DIAGNOSIS — N186 End stage renal disease: Secondary | ICD-10-CM | POA: Diagnosis not present

## 2021-04-20 DIAGNOSIS — N2581 Secondary hyperparathyroidism of renal origin: Secondary | ICD-10-CM | POA: Diagnosis not present

## 2021-04-22 DIAGNOSIS — Z992 Dependence on renal dialysis: Secondary | ICD-10-CM | POA: Diagnosis not present

## 2021-04-22 DIAGNOSIS — D631 Anemia in chronic kidney disease: Secondary | ICD-10-CM | POA: Diagnosis not present

## 2021-04-22 DIAGNOSIS — N2581 Secondary hyperparathyroidism of renal origin: Secondary | ICD-10-CM | POA: Diagnosis not present

## 2021-04-22 DIAGNOSIS — D509 Iron deficiency anemia, unspecified: Secondary | ICD-10-CM | POA: Diagnosis not present

## 2021-04-22 DIAGNOSIS — N186 End stage renal disease: Secondary | ICD-10-CM | POA: Diagnosis not present
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
## 2021-04-24 DIAGNOSIS — Z992 Dependence on renal dialysis: Secondary | ICD-10-CM | POA: Diagnosis not present

## 2021-04-24 DIAGNOSIS — N2581 Secondary hyperparathyroidism of renal origin: Secondary | ICD-10-CM | POA: Diagnosis not present

## 2021-04-24 DIAGNOSIS — D631 Anemia in chronic kidney disease: Secondary | ICD-10-CM | POA: Diagnosis not present

## 2021-04-24 DIAGNOSIS — N186 End stage renal disease: Secondary | ICD-10-CM | POA: Diagnosis not present

## 2021-04-24 DIAGNOSIS — D509 Iron deficiency anemia, unspecified: Secondary | ICD-10-CM | POA: Diagnosis not present

## 2021-04-26 ENCOUNTER — Telehealth (HOSPITAL_COMMUNITY): Payer: Self-pay | Admitting: *Deleted

## 2021-04-26 ENCOUNTER — Encounter (INDEPENDENT_AMBULATORY_CARE_PROVIDER_SITE_OTHER): Payer: Medicaid Other | Admitting: Ophthalmology

## 2021-04-26 DIAGNOSIS — I10 Essential (primary) hypertension: Secondary | ICD-10-CM

## 2021-04-26 DIAGNOSIS — H3581 Retinal edema: Secondary | ICD-10-CM

## 2021-04-26 DIAGNOSIS — E103513 Type 1 diabetes mellitus with proliferative diabetic retinopathy with macular edema, bilateral: Secondary | ICD-10-CM

## 2021-04-26 DIAGNOSIS — H35033 Hypertensive retinopathy, bilateral: Secondary | ICD-10-CM

## 2021-04-26 DIAGNOSIS — H35713 Central serous chorioretinopathy, bilateral: Secondary | ICD-10-CM

## 2021-04-26 DIAGNOSIS — H269 Unspecified cataract: Secondary | ICD-10-CM

## 2021-04-26 NOTE — Telephone Encounter (Signed)
[facility] faxed over refill request for: busPIRone (BUSPAR) 15 MG tablet  &  escitalopram (LEXAPRO) 20 MG tablet.

## 2021-04-26 NOTE — Telephone Encounter (Signed)
Patient of Dr Toy Care  last visit  02/19/2020  Rx sent refill request

## 2021-04-27 DIAGNOSIS — N2581 Secondary hyperparathyroidism of renal origin: Secondary | ICD-10-CM | POA: Diagnosis not present

## 2021-04-27 DIAGNOSIS — Z992 Dependence on renal dialysis: Secondary | ICD-10-CM | POA: Diagnosis not present

## 2021-04-27 DIAGNOSIS — N186 End stage renal disease: Secondary | ICD-10-CM | POA: Diagnosis not present

## 2021-04-27 DIAGNOSIS — D631 Anemia in chronic kidney disease: Secondary | ICD-10-CM | POA: Diagnosis not present

## 2021-04-27 DIAGNOSIS — D509 Iron deficiency anemia, unspecified: Secondary | ICD-10-CM | POA: Diagnosis not present

## 2021-04-28 ENCOUNTER — Other Ambulatory Visit: Payer: Self-pay

## 2021-04-28 NOTE — Patient Outreach (Signed)
Medication BB B L EM BT PCP Refill Needed?  Buspirone '15mg'$      1 Yes  Dexcom G6 sensor      No  Escitalopram '20mg'$       Yes  Furosemide '40mg'$       No  Gabapentin '300mg'$   2   2 No  Hydralazine '50mg'$   '2 2  2 '$ No  Levothyroxine 120mg 1     No  Losartan '50mg'$   1    No  Novolog Inj 100/mL      No  Pravastatin '20mg'$      1 No  Sodium bicarb '650mg'$   1   1 No    Meds scheduled for delivery on 05/04/21. Patient stated she did NOT want Sodium Bicarb delivered. F/U 1 Month

## 2021-04-29 DIAGNOSIS — Z992 Dependence on renal dialysis: Secondary | ICD-10-CM | POA: Diagnosis not present

## 2021-04-29 DIAGNOSIS — N186 End stage renal disease: Secondary | ICD-10-CM | POA: Diagnosis not present

## 2021-04-29 DIAGNOSIS — N2581 Secondary hyperparathyroidism of renal origin: Secondary | ICD-10-CM | POA: Diagnosis not present

## 2021-04-29 DIAGNOSIS — D631 Anemia in chronic kidney disease: Secondary | ICD-10-CM | POA: Diagnosis not present

## 2021-04-29 DIAGNOSIS — D509 Iron deficiency anemia, unspecified: Secondary | ICD-10-CM | POA: Diagnosis not present

## 2021-05-01 DIAGNOSIS — D631 Anemia in chronic kidney disease: Secondary | ICD-10-CM | POA: Diagnosis not present

## 2021-05-01 DIAGNOSIS — N2581 Secondary hyperparathyroidism of renal origin: Secondary | ICD-10-CM | POA: Diagnosis not present

## 2021-05-01 DIAGNOSIS — Z992 Dependence on renal dialysis: Secondary | ICD-10-CM | POA: Diagnosis not present

## 2021-05-01 DIAGNOSIS — N186 End stage renal disease: Secondary | ICD-10-CM | POA: Diagnosis not present

## 2021-05-01 DIAGNOSIS — D509 Iron deficiency anemia, unspecified: Secondary | ICD-10-CM | POA: Diagnosis not present

## 2021-05-04 ENCOUNTER — Other Ambulatory Visit: Payer: Self-pay | Admitting: Internal Medicine

## 2021-05-04 DIAGNOSIS — N186 End stage renal disease: Secondary | ICD-10-CM | POA: Diagnosis not present

## 2021-05-04 DIAGNOSIS — D631 Anemia in chronic kidney disease: Secondary | ICD-10-CM | POA: Diagnosis not present

## 2021-05-04 DIAGNOSIS — Z992 Dependence on renal dialysis: Secondary | ICD-10-CM | POA: Diagnosis not present

## 2021-05-04 DIAGNOSIS — D509 Iron deficiency anemia, unspecified: Secondary | ICD-10-CM | POA: Diagnosis not present

## 2021-05-04 DIAGNOSIS — E1029 Type 1 diabetes mellitus with other diabetic kidney complication: Secondary | ICD-10-CM

## 2021-05-04 DIAGNOSIS — N2581 Secondary hyperparathyroidism of renal origin: Secondary | ICD-10-CM | POA: Diagnosis not present

## 2021-05-04 NOTE — Telephone Encounter (Signed)
  Notes to clinic: Patient is due for a follow up appointment per note on 04/12/21 Review for a refill to have on file.  Medication was filed on 04/29/2021   Requested Prescriptions  Pending Prescriptions Disp Refills   NOVOLOG 100 UNIT/ML injection [Pharmacy Med Name: Novolog U-100 Insulin aspart 100 unit/mL subcutaneous solution] 10 mL 1    Sig: USE AS DIRECTED in insulin pump      Endocrinology:  Diabetes - Insulins Failed - 05/04/2021  8:03 AM      Failed - HBA1C is between 0 and 7.9 and within 180 days    HbA1c, POC (controlled diabetic range)  Date Value Ref Range Status  12/28/2020 10.2 (A) 0.0 - 7.0 % Final          Passed - Valid encounter within last 6 months    Recent Outpatient Visits           1 month ago [place], [place], [place]   4 months ago Other insomnia   [place], Neoma Laming B, MD   5 months ago 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, with long-term current use of insulin ([place])   [place] Swords, [name], MD   12 months ago 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, with long-term current use of insulin ([place])   [place], MD   1 year ago Chest pain, unspecified type   [place], [place], NP

## 2021-05-05 DIAGNOSIS — N186 End stage renal disease: Secondary | ICD-10-CM | POA: Diagnosis not present

## 2021-05-05 DIAGNOSIS — Z992 Dependence on renal dialysis: Secondary | ICD-10-CM | POA: Diagnosis not present

## 2021-05-05 DIAGNOSIS — N2581 Secondary hyperparathyroidism of renal origin: Secondary | ICD-10-CM | POA: Diagnosis not present

## 2021-05-05 DIAGNOSIS — D631 Anemia in chronic kidney disease: Secondary | ICD-10-CM | POA: Diagnosis not present

## 2021-05-05 DIAGNOSIS — D509 Iron deficiency anemia, unspecified: Secondary | ICD-10-CM | POA: Diagnosis not present

## 2021-05-08 DIAGNOSIS — N186 End stage renal disease: Secondary | ICD-10-CM | POA: Diagnosis not present

## 2021-05-08 DIAGNOSIS — Z992 Dependence on renal dialysis: Secondary | ICD-10-CM | POA: Diagnosis not present

## 2021-05-08 DIAGNOSIS — N2581 Secondary hyperparathyroidism of renal origin: Secondary | ICD-10-CM | POA: Diagnosis not present

## 2021-05-08 DIAGNOSIS — D631 Anemia in chronic kidney disease: Secondary | ICD-10-CM | POA: Diagnosis not present

## 2021-05-08 DIAGNOSIS — D509 Iron deficiency anemia, unspecified: Secondary | ICD-10-CM | POA: Diagnosis not present

## 2021-05-10 ENCOUNTER — Ambulatory Visit: Payer: Medicaid Other | Admitting: Obstetrics

## 2021-05-11 DIAGNOSIS — N186 End stage renal disease: Secondary | ICD-10-CM | POA: Diagnosis not present

## 2021-05-11 DIAGNOSIS — Z992 Dependence on renal dialysis: Secondary | ICD-10-CM | POA: Diagnosis not present

## 2021-05-11 DIAGNOSIS — D509 Iron deficiency anemia, unspecified: Secondary | ICD-10-CM | POA: Diagnosis not present

## 2021-05-11 DIAGNOSIS — D631 Anemia in chronic kidney disease: Secondary | ICD-10-CM | POA: Diagnosis not present

## 2021-05-11 DIAGNOSIS — N2581 Secondary hyperparathyroidism of renal origin: Secondary | ICD-10-CM | POA: Diagnosis not present

## 2021-05-12 ENCOUNTER — Other Ambulatory Visit: Payer: Self-pay | Admitting: Internal Medicine

## 2021-05-12 NOTE — Telephone Encounter (Signed)
Requested medication (s) are due for refill today: yes  Requested medication (s) are on the active medication list: yes  Last refill:  10/15/20  Future visit scheduled:no  Notes to clinic:  historical provider    Requested Prescriptions  Pending Prescriptions Disp Refills   gabapentin (NEURONTIN) 300 MG capsule [Pharmacy Med Name: GABAPENTIN '300MG'$  CAPSULES] 120 capsule     Sig: TAKE 2 CAPSULES(600 MG) BY MOUTH TWICE DAILY      Neurology: Anticonvulsants - gabapentin Passed - 05/12/2021  6:52 AM      Passed - Valid encounter within last 12 months    Recent Outpatient Visits           1 month ago [place], [place], [place]   4 months ago Other insomnia   [place], Neoma Laming B, MD   6 months ago 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, with long-term current use of insulin ([place])   [place] Swords, [name], MD   1 year ago 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, with long-term current use of insulin ([place])   [place], MD   1 year ago Chest pain, unspecified type   [place], [place], NP

## 2021-05-13 DIAGNOSIS — N2581 Secondary hyperparathyroidism of renal origin: Secondary | ICD-10-CM | POA: Diagnosis not present

## 2021-05-13 DIAGNOSIS — D631 Anemia in chronic kidney disease: Secondary | ICD-10-CM | POA: Diagnosis not present

## 2021-05-13 DIAGNOSIS — N186 End stage renal disease: Secondary | ICD-10-CM | POA: Diagnosis not present

## 2021-05-13 DIAGNOSIS — Z992 Dependence on renal dialysis: Secondary | ICD-10-CM | POA: Diagnosis not present

## 2021-05-13 DIAGNOSIS — D509 Iron deficiency anemia, unspecified: Secondary | ICD-10-CM | POA: Diagnosis not present

## 2021-05-15 DIAGNOSIS — Z992 Dependence on renal dialysis: Secondary | ICD-10-CM | POA: Diagnosis not present

## 2021-05-15 DIAGNOSIS — N186 End stage renal disease: Secondary | ICD-10-CM | POA: Diagnosis not present

## 2021-05-15 DIAGNOSIS — D631 Anemia in chronic kidney disease: Secondary | ICD-10-CM | POA: Diagnosis not present

## 2021-05-15 DIAGNOSIS — N2581 Secondary hyperparathyroidism of renal origin: Secondary | ICD-10-CM | POA: Diagnosis not present

## 2021-05-15 DIAGNOSIS — D509 Iron deficiency anemia, unspecified: Secondary | ICD-10-CM | POA: Diagnosis not present

## 2021-05-18 DIAGNOSIS — N186 End stage renal disease: Secondary | ICD-10-CM | POA: Diagnosis not present

## 2021-05-18 DIAGNOSIS — Z992 Dependence on renal dialysis: Secondary | ICD-10-CM | POA: Diagnosis not present

## 2021-05-18 DIAGNOSIS — N2581 Secondary hyperparathyroidism of renal origin: Secondary | ICD-10-CM | POA: Diagnosis not present

## 2021-05-18 DIAGNOSIS — D631 Anemia in chronic kidney disease: Secondary | ICD-10-CM | POA: Diagnosis not present

## 2021-05-18 DIAGNOSIS — D509 Iron deficiency anemia, unspecified: Secondary | ICD-10-CM | POA: Diagnosis not present

## 2021-05-20 DIAGNOSIS — E039 Hypothyroidism, unspecified: Secondary | ICD-10-CM | POA: Diagnosis not present

## 2021-05-20 DIAGNOSIS — Z992 Dependence on renal dialysis: Secondary | ICD-10-CM | POA: Diagnosis not present

## 2021-05-20 DIAGNOSIS — D509 Iron deficiency anemia, unspecified: Secondary | ICD-10-CM | POA: Diagnosis not present

## 2021-05-20 DIAGNOSIS — N186 End stage renal disease: Secondary | ICD-10-CM | POA: Diagnosis not present

## 2021-05-20 DIAGNOSIS — D631 Anemia in chronic kidney disease: Secondary | ICD-10-CM | POA: Diagnosis not present

## 2021-05-20 DIAGNOSIS — N2581 Secondary hyperparathyroidism of renal origin: Secondary | ICD-10-CM | POA: Diagnosis not present

## 2021-05-20 DIAGNOSIS — E1029 Type 1 diabetes mellitus with other diabetic kidney complication: Secondary | ICD-10-CM | POA: Diagnosis not present

## 2021-05-22 DIAGNOSIS — N2581 Secondary hyperparathyroidism of renal origin: Secondary | ICD-10-CM | POA: Diagnosis not present

## 2021-05-22 DIAGNOSIS — D631 Anemia in chronic kidney disease: Secondary | ICD-10-CM | POA: Diagnosis not present

## 2021-05-22 DIAGNOSIS — N186 End stage renal disease: Secondary | ICD-10-CM | POA: Diagnosis not present

## 2021-05-22 DIAGNOSIS — D509 Iron deficiency anemia, unspecified: Secondary | ICD-10-CM | POA: Diagnosis not present

## 2021-05-22 DIAGNOSIS — Z992 Dependence on renal dialysis: Secondary | ICD-10-CM | POA: Diagnosis not present

## 2021-05-25 DIAGNOSIS — D509 Iron deficiency anemia, unspecified: Secondary | ICD-10-CM | POA: Diagnosis not present

## 2021-05-25 DIAGNOSIS — Z992 Dependence on renal dialysis: Secondary | ICD-10-CM | POA: Diagnosis not present

## 2021-05-25 DIAGNOSIS — N2581 Secondary hyperparathyroidism of renal origin: Secondary | ICD-10-CM | POA: Diagnosis not present

## 2021-05-25 DIAGNOSIS — N186 End stage renal disease: Secondary | ICD-10-CM | POA: Diagnosis not present

## 2021-05-25 DIAGNOSIS — D631 Anemia in chronic kidney disease: Secondary | ICD-10-CM | POA: Diagnosis not present

## 2021-05-26 ENCOUNTER — Other Ambulatory Visit: Payer: Self-pay | Admitting: Internal Medicine

## 2021-05-26 ENCOUNTER — Other Ambulatory Visit: Payer: Medicaid Other

## 2021-05-26 DIAGNOSIS — F418 Other specified anxiety disorders: Secondary | ICD-10-CM

## 2021-05-26 DIAGNOSIS — E1029 Type 1 diabetes mellitus with other diabetic kidney complication: Secondary | ICD-10-CM

## 2021-05-26 DIAGNOSIS — F331 Major depressive disorder, recurrent, moderate: Secondary | ICD-10-CM

## 2021-05-26 MED ORDER — ESCITALOPRAM OXALATE 20 MG PO TABS: 1.0000 | ORAL_TABLET | Freq: Every day | ORAL | 3 refills | Status: DC

## 2021-05-26 MED ORDER — BUSPIRONE HCL 15 MG PO TABS: 15.0000 mg | ORAL_TABLET | Freq: Every day | ORAL | 1 refills | Status: DC

## 2021-05-26 MED ORDER — TRESIBA 100 UNIT/ML ~~LOC~~ SOLN: 22.0000 [IU] | Freq: Every day | SUBCUTANEOUS | 3 refills | Status: DC

## 2021-05-26 NOTE — Patient Outreach (Signed)
Patient due for refills on 06/02/21  Levothyroxine 200 mcg   Escitalopram 20 mg   Gabapentin 300 mg   Hydralazine 50 mg   Losartan 50 mg   Buspirone 15 mg   Pravastatin 20 mg   Novolog Injection  Tresiba Flextouch   Dexcom Sensors  Amlodipine -Emgality -Furosemide BiCarb    Will ask PCP for refill of Buspirone, Escitalopram, and Tresiba (sent requests on 6/3)  Unable to contact patient, will proceed with delivery, left VM

## 2021-05-27 DIAGNOSIS — N186 End stage renal disease: Secondary | ICD-10-CM | POA: Diagnosis not present

## 2021-05-27 DIAGNOSIS — Z992 Dependence on renal dialysis: Secondary | ICD-10-CM | POA: Diagnosis not present

## 2021-05-27 DIAGNOSIS — N2581 Secondary hyperparathyroidism of renal origin: Secondary | ICD-10-CM | POA: Diagnosis not present

## 2021-05-27 DIAGNOSIS — D509 Iron deficiency anemia, unspecified: Secondary | ICD-10-CM | POA: Diagnosis not present

## 2021-05-27 DIAGNOSIS — D631 Anemia in chronic kidney disease: Secondary | ICD-10-CM | POA: Diagnosis not present

## 2021-05-29 DIAGNOSIS — D631 Anemia in chronic kidney disease: Secondary | ICD-10-CM | POA: Diagnosis not present

## 2021-05-29 DIAGNOSIS — D509 Iron deficiency anemia, unspecified: Secondary | ICD-10-CM | POA: Diagnosis not present

## 2021-05-29 DIAGNOSIS — N2581 Secondary hyperparathyroidism of renal origin: Secondary | ICD-10-CM | POA: Diagnosis not present

## 2021-05-29 DIAGNOSIS — N186 End stage renal disease: Secondary | ICD-10-CM | POA: Diagnosis not present

## 2021-05-29 DIAGNOSIS — Z992 Dependence on renal dialysis: Secondary | ICD-10-CM | POA: Diagnosis not present

## 2021-05-31 ENCOUNTER — Other Ambulatory Visit: Payer: Self-pay | Admitting: Internal Medicine

## 2021-05-31 NOTE — Telephone Encounter (Signed)
   Notes to clinic: Patient was due for a 1 month follow up 04/23/2021  Review for courtesy refill    Requested Prescriptions  Pending Prescriptions Disp Refills   levothyroxine (SYNTHROID) 200 MCG tablet [Pharmacy Med Name: levothyroxine 200 mcg tablet] 30 tablet 1    Sig: TAKE ONE TABLET BY MOUTH BEFORE BREAKFAST      Endocrinology:  Hypothyroid Agents Failed - 05/31/2021 11:24 AM      Failed - TSH needs to be rechecked within 3 months after an abnormal result. Refill until TSH is due.      Failed - TSH in normal range and within 360 days    TSH  Date Value Ref Range Status  12/28/2020 111.000 (H) 0.450 - 4.500 uIU/mL Final    Comment:    Results confirmed on dilution.           Passed - Valid encounter within last 12 months    Recent Outpatient Visits           2 months ago [place], [place], [place]   5 months ago Other insomnia   [place], Neoma Laming B, MD   6 months ago 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, with long-term current use of insulin ([place])   [place] Swords, [name], MD   1 year ago 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, with long-term current use of insulin [facility])   [place], MD   1 year ago Chest pain, unspecified type   [place], Amy J, NP

## 2021-06-01 DIAGNOSIS — Z992 Dependence on renal dialysis: Secondary | ICD-10-CM | POA: Diagnosis not present

## 2021-06-01 DIAGNOSIS — N2581 Secondary hyperparathyroidism of renal origin: Secondary | ICD-10-CM | POA: Diagnosis not present

## 2021-06-01 DIAGNOSIS — D509 Iron deficiency anemia, unspecified: Secondary | ICD-10-CM | POA: Diagnosis not present

## 2021-06-01 DIAGNOSIS — D631 Anemia in chronic kidney disease: Secondary | ICD-10-CM | POA: Diagnosis not present

## 2021-06-01 DIAGNOSIS — N186 End stage renal disease: Secondary | ICD-10-CM | POA: Diagnosis not present

## 2021-06-02 ENCOUNTER — Other Ambulatory Visit: Payer: Self-pay

## 2021-06-02 MED ORDER — ONDANSETRON 4 MG PO TBDP: 4.0000 mg | ORAL_TABLET | Freq: Three times a day (TID) | ORAL | 0 refills | Status: DC | PRN

## 2021-06-03 DIAGNOSIS — N186 End stage renal disease: Secondary | ICD-10-CM | POA: Diagnosis not present

## 2021-06-03 DIAGNOSIS — N2581 Secondary hyperparathyroidism of renal origin: Secondary | ICD-10-CM | POA: Diagnosis not present

## 2021-06-03 DIAGNOSIS — D631 Anemia in chronic kidney disease: Secondary | ICD-10-CM | POA: Diagnosis not present

## 2021-06-03 DIAGNOSIS — Z992 Dependence on renal dialysis: Secondary | ICD-10-CM | POA: Diagnosis not present

## 2021-06-03 DIAGNOSIS — D509 Iron deficiency anemia, unspecified: Secondary | ICD-10-CM | POA: Diagnosis not present

## 2021-06-04 DIAGNOSIS — E039 Hypothyroidism, unspecified: Secondary | ICD-10-CM | POA: Diagnosis not present

## 2021-06-04 DIAGNOSIS — E1040 Type 1 diabetes mellitus with diabetic neuropathy, unspecified: Secondary | ICD-10-CM | POA: Diagnosis not present

## 2021-06-04 DIAGNOSIS — K3184 Gastroparesis: Secondary | ICD-10-CM | POA: Diagnosis not present

## 2021-06-04 DIAGNOSIS — Z992 Dependence on renal dialysis: Secondary | ICD-10-CM | POA: Diagnosis not present

## 2021-06-04 DIAGNOSIS — E1022 Type 1 diabetes mellitus with diabetic chronic kidney disease: Secondary | ICD-10-CM | POA: Diagnosis not present

## 2021-06-04 DIAGNOSIS — E1065 Type 1 diabetes mellitus with hyperglycemia: Secondary | ICD-10-CM | POA: Diagnosis not present

## 2021-06-04 DIAGNOSIS — E1043 Type 1 diabetes mellitus with diabetic autonomic (poly)neuropathy: Secondary | ICD-10-CM | POA: Diagnosis not present

## 2021-06-04 DIAGNOSIS — N186 End stage renal disease: Secondary | ICD-10-CM | POA: Diagnosis not present

## 2021-06-05 ENCOUNTER — Other Ambulatory Visit: Payer: Self-pay | Admitting: Internal Medicine

## 2021-06-05 DIAGNOSIS — D509 Iron deficiency anemia, unspecified: Secondary | ICD-10-CM | POA: Diagnosis not present

## 2021-06-05 DIAGNOSIS — Z992 Dependence on renal dialysis: Secondary | ICD-10-CM | POA: Diagnosis not present

## 2021-06-05 DIAGNOSIS — N186 End stage renal disease: Secondary | ICD-10-CM | POA: Diagnosis not present

## 2021-06-05 DIAGNOSIS — E1029 Type 1 diabetes mellitus with other diabetic kidney complication: Secondary | ICD-10-CM

## 2021-06-05 DIAGNOSIS — D631 Anemia in chronic kidney disease: Secondary | ICD-10-CM | POA: Diagnosis not present

## 2021-06-05 DIAGNOSIS — N2581 Secondary hyperparathyroidism of renal origin: Secondary | ICD-10-CM | POA: Diagnosis not present

## 2021-06-05 NOTE — Telephone Encounter (Signed)
Pt due for office visit. Last Hgb A1C was 12/28/20. Due for RF. MyChart message sent to pt to make appt. Last RF 05/04/21 10 ml 1 RF Requested Prescriptions  Pending Prescriptions Disp Refills   NOVOLOG 100 UNIT/ML injection [Pharmacy Med Name: Novolog U-100 Insulin aspart 100 unit/mL subcutaneous solution] 10 mL 1    Sig: USE AS DIRECTED in insulin pump      Endocrinology:  Diabetes - Insulins Failed - 06/05/2021  8:00 AM      Failed - HBA1C is between 0 and 7.9 and within 180 days    HbA1c, POC (controlled diabetic range)  Date Value Ref Range Status  12/28/2020 10.2 (A) 0.0 - 7.0 % Final          Passed - Valid encounter within last 6 months    Recent Outpatient Visits           2 months ago [place], [place], [place]   5 months ago Other insomnia   [place], Neoma Laming B, MD   6 months ago 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, with long-term current use of insulin ([place])   [place] Swords, [name], MD   1 year ago 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, with long-term current use of insulin ([place])   [place], MD   1 year ago Chest pain, unspecified type   [place], [place], NP

## 2021-06-08 DIAGNOSIS — N2581 Secondary hyperparathyroidism of renal origin: Secondary | ICD-10-CM | POA: Diagnosis not present

## 2021-06-08 DIAGNOSIS — N186 End stage renal disease: Secondary | ICD-10-CM | POA: Diagnosis not present

## 2021-06-08 DIAGNOSIS — D509 Iron deficiency anemia, unspecified: Secondary | ICD-10-CM | POA: Diagnosis not present

## 2021-06-08 DIAGNOSIS — Z992 Dependence on renal dialysis: Secondary | ICD-10-CM | POA: Diagnosis not present

## 2021-06-08 DIAGNOSIS — D631 Anemia in chronic kidney disease: Secondary | ICD-10-CM | POA: Diagnosis not present

## 2021-06-09 DIAGNOSIS — N186 End stage renal disease: Secondary | ICD-10-CM | POA: Diagnosis not present

## 2021-06-10 DIAGNOSIS — D509 Iron deficiency anemia, unspecified: Secondary | ICD-10-CM | POA: Diagnosis not present

## 2021-06-10 DIAGNOSIS — N186 End stage renal disease: Secondary | ICD-10-CM | POA: Diagnosis not present

## 2021-06-10 DIAGNOSIS — N2581 Secondary hyperparathyroidism of renal origin: Secondary | ICD-10-CM | POA: Diagnosis not present

## 2021-06-10 DIAGNOSIS — D631 Anemia in chronic kidney disease: Secondary | ICD-10-CM | POA: Diagnosis not present

## 2021-06-10 DIAGNOSIS — Z992 Dependence on renal dialysis: Secondary | ICD-10-CM | POA: Diagnosis not present

## 2021-06-12 DIAGNOSIS — D509 Iron deficiency anemia, unspecified: Secondary | ICD-10-CM | POA: Diagnosis not present

## 2021-06-12 DIAGNOSIS — D631 Anemia in chronic kidney disease: Secondary | ICD-10-CM | POA: Diagnosis not present

## 2021-06-12 DIAGNOSIS — N2581 Secondary hyperparathyroidism of renal origin: Secondary | ICD-10-CM | POA: Diagnosis not present

## 2021-06-12 DIAGNOSIS — N186 End stage renal disease: Secondary | ICD-10-CM | POA: Diagnosis not present

## 2021-06-12 DIAGNOSIS — Z992 Dependence on renal dialysis: Secondary | ICD-10-CM | POA: Diagnosis not present

## 2021-06-15 DIAGNOSIS — D631 Anemia in chronic kidney disease: Secondary | ICD-10-CM | POA: Diagnosis not present

## 2021-06-15 DIAGNOSIS — N186 End stage renal disease: Secondary | ICD-10-CM | POA: Diagnosis not present

## 2021-06-15 DIAGNOSIS — Z992 Dependence on renal dialysis: Secondary | ICD-10-CM | POA: Diagnosis not present

## 2021-06-15 DIAGNOSIS — D509 Iron deficiency anemia, unspecified: Secondary | ICD-10-CM | POA: Diagnosis not present

## 2021-06-15 DIAGNOSIS — N2581 Secondary hyperparathyroidism of renal origin: Secondary | ICD-10-CM | POA: Diagnosis not present

## 2021-06-17 DIAGNOSIS — Z992 Dependence on renal dialysis: Secondary | ICD-10-CM | POA: Diagnosis not present

## 2021-06-17 DIAGNOSIS — N2581 Secondary hyperparathyroidism of renal origin: Secondary | ICD-10-CM | POA: Diagnosis not present

## 2021-06-17 DIAGNOSIS — D631 Anemia in chronic kidney disease: Secondary | ICD-10-CM | POA: Diagnosis not present

## 2021-06-17 DIAGNOSIS — N186 End stage renal disease: Secondary | ICD-10-CM | POA: Diagnosis not present

## 2021-06-17 DIAGNOSIS — D509 Iron deficiency anemia, unspecified: Secondary | ICD-10-CM | POA: Diagnosis not present

## 2021-06-18 ENCOUNTER — Emergency Department (HOSPITAL_COMMUNITY): Payer: Medicaid Other

## 2021-06-18 ENCOUNTER — Other Ambulatory Visit: Payer: Self-pay

## 2021-06-18 ENCOUNTER — Encounter (HOSPITAL_COMMUNITY): Payer: Self-pay

## 2021-06-18 ENCOUNTER — Emergency Department (HOSPITAL_COMMUNITY)
Admission: EM | Admit: 2021-06-18 | Discharge: 2021-06-19 | Disposition: A | Discharge status: Home / Self Care | Payer: Medicaid Other | Attending: Emergency Medicine | Admitting: Emergency Medicine

## 2021-06-18 DIAGNOSIS — E039 Hypothyroidism, unspecified: Secondary | ICD-10-CM | POA: Diagnosis not present

## 2021-06-18 DIAGNOSIS — E1040 Type 1 diabetes mellitus with diabetic neuropathy, unspecified: Secondary | ICD-10-CM | POA: Insufficient documentation

## 2021-06-18 DIAGNOSIS — Z87891 Personal history of nicotine dependence: Secondary | ICD-10-CM | POA: Diagnosis not present

## 2021-06-18 DIAGNOSIS — J45909 Unspecified asthma, uncomplicated: Secondary | ICD-10-CM | POA: Diagnosis not present

## 2021-06-18 DIAGNOSIS — R404 Transient alteration of awareness: Secondary | ICD-10-CM | POA: Diagnosis not present

## 2021-06-18 DIAGNOSIS — E1165 Type 2 diabetes mellitus with hyperglycemia: Secondary | ICD-10-CM | POA: Diagnosis not present

## 2021-06-18 DIAGNOSIS — I10 Essential (primary) hypertension: Secondary | ICD-10-CM

## 2021-06-18 DIAGNOSIS — N185 Chronic kidney disease, stage 5: Secondary | ICD-10-CM | POA: Diagnosis not present

## 2021-06-18 DIAGNOSIS — Z794 Long term (current) use of insulin: Secondary | ICD-10-CM | POA: Diagnosis not present

## 2021-06-18 DIAGNOSIS — R55 Syncope and collapse: Secondary | ICD-10-CM | POA: Diagnosis not present

## 2021-06-18 DIAGNOSIS — E1022 Type 1 diabetes mellitus with diabetic chronic kidney disease: Secondary | ICD-10-CM | POA: Insufficient documentation

## 2021-06-18 DIAGNOSIS — R739 Hyperglycemia, unspecified: Secondary | ICD-10-CM | POA: Diagnosis not present

## 2021-06-18 DIAGNOSIS — I120 Hypertensive chronic kidney disease with stage 5 chronic kidney disease or end stage renal disease: Secondary | ICD-10-CM | POA: Insufficient documentation

## 2021-06-18 DIAGNOSIS — Z79899 Other long term (current) drug therapy: Secondary | ICD-10-CM | POA: Diagnosis not present

## 2021-06-18 DIAGNOSIS — E1065 Type 1 diabetes mellitus with hyperglycemia: Secondary | ICD-10-CM | POA: Insufficient documentation

## 2021-06-18 DIAGNOSIS — R110 Nausea: Secondary | ICD-10-CM | POA: Diagnosis not present

## 2021-06-18 MED ORDER — HYDRALAZINE HCL 25 MG PO TABS
100.0000 mg | ORAL_TABLET | Freq: Once | ORAL | Status: AC
  Administered 2021-06-18: 100 mg via ORAL
  Filled 2021-06-18: qty 4
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
## 2021-06-18 NOTE — ED Triage Notes (Signed)
BIB [facility] form home. Pt took CBG at home and it was reading high. She took 12 units of long acting and 4 units of novolog. Pt then started to have leg pain and then shaking all over body. Boyfriend reports it was seizure like activity but pt was alert the whole time and no history. Pt then had a syncopal episode and was unconscious when EMS arrived. Pt came to and ambulated to stretcher. Only complaint now is feeling weak and wobbly.   Pt just had a fistula placed last week in left arm. Received dialysis Tuesday, Thursday, Saturday.  240/130 80 heart rate CBG reading high.

## 2021-06-19 DIAGNOSIS — D509 Iron deficiency anemia, unspecified: Secondary | ICD-10-CM | POA: Diagnosis not present

## 2021-06-19 DIAGNOSIS — Z992 Dependence on renal dialysis: Secondary | ICD-10-CM | POA: Diagnosis not present

## 2021-06-19 DIAGNOSIS — D631 Anemia in chronic kidney disease: Secondary | ICD-10-CM | POA: Diagnosis not present

## 2021-06-19 DIAGNOSIS — N186 End stage renal disease: Secondary | ICD-10-CM | POA: Diagnosis not present

## 2021-06-19 DIAGNOSIS — N2581 Secondary hyperparathyroidism of renal origin: Secondary | ICD-10-CM | POA: Diagnosis not present

## 2021-06-19 DIAGNOSIS — T827XXA Infection and inflammatory reaction due to other cardiac and vascular devices, implants and grafts, initial encounter: Secondary | ICD-10-CM | POA: Diagnosis not present

## 2021-06-19 DIAGNOSIS — E785 Hyperlipidemia, unspecified: Secondary | ICD-10-CM | POA: Diagnosis not present

## 2021-06-19 DIAGNOSIS — E1165 Type 2 diabetes mellitus with hyperglycemia: Secondary | ICD-10-CM | POA: Diagnosis not present

## 2021-06-19 DIAGNOSIS — I10 Essential (primary) hypertension: Secondary | ICD-10-CM | POA: Diagnosis not present

## 2021-06-19 LAB — I-STAT VENOUS BLOOD GAS, ED
Acid-base deficit: 1 mmol/L (ref 0.0–2.0)
Bicarbonate: 26.4 mmol/L (ref 20.0–28.0)
Calcium, Ion: 1.26 mmol/L (ref 1.15–1.40)
HCT: 33 % — ABNORMAL LOW (ref 36.0–46.0)
Hemoglobin: 11.2 g/dL — ABNORMAL LOW (ref 12.0–15.0)
O2 Saturation: 85 %
Potassium: 4 mmol/L (ref 3.5–5.1)
Sodium: 132 mmol/L — ABNORMAL LOW (ref 135–145)
TCO2: 28 mmol/L (ref 22–32)
pCO2, Ven: 53 mmHg (ref 44.0–60.0)
pH, Ven: 7.306 (ref 7.250–7.430)
pO2, Ven: 55 mmHg — ABNORMAL HIGH (ref 32.0–45.0)

## 2021-06-19 LAB — CBC WITH DIFFERENTIAL/PLATELET
Abs Immature Granulocytes: 0.02 10*3/uL (ref 0.00–0.07)
Basophils Absolute: 0 10*3/uL (ref 0.0–0.1)
Basophils Relative: 1 %
Eosinophils Absolute: 0.2 10*3/uL (ref 0.0–0.5)
Eosinophils Relative: 5 %
HCT: 31.3 % — ABNORMAL LOW (ref 36.0–46.0)
Hemoglobin: 9.6 g/dL — ABNORMAL LOW (ref 12.0–15.0)
Immature Granulocytes: 1 %
Lymphocytes Relative: 19 %
Lymphs Abs: 0.8 10*3/uL (ref 0.7–4.0)
MCH: 28.2 pg (ref 26.0–34.0)
MCHC: 30.7 g/dL (ref 30.0–36.0)
MCV: 92.1 fL (ref 80.0–100.0)
Monocytes Absolute: 0.5 10*3/uL (ref 0.1–1.0)
Monocytes Relative: 13 %
Neutro Abs: 2.5 10*3/uL (ref 1.7–7.7)
Neutrophils Relative %: 61 %
Platelets: 64 10*3/uL — ABNORMAL LOW (ref 150–400)
RBC: 3.4 MIL/uL — ABNORMAL LOW (ref 3.87–5.11)
RDW: 13.9 % (ref 11.5–15.5)
WBC: 4 10*3/uL (ref 4.0–10.5)
nRBC: 0 % (ref 0.0–0.2)

## 2021-06-19 LAB — COMPREHENSIVE METABOLIC PANEL
ALT: 16 U/L (ref 0–44)
AST: 26 U/L (ref 15–41)
Albumin: 3 g/dL — ABNORMAL LOW (ref 3.5–5.0)
Alkaline Phosphatase: 147 U/L — ABNORMAL HIGH (ref 38–126)
Anion gap: 8 (ref 5–15)
BUN: 16 mg/dL (ref 6–20)
CO2: 25 mmol/L (ref 22–32)
Calcium: 8.8 mg/dL — ABNORMAL LOW (ref 8.9–10.3)
Chloride: 98 mmol/L (ref 98–111)
Creatinine, Ser: 5.08 mg/dL — ABNORMAL HIGH (ref 0.44–1.00)
GFR, Estimated: 11 mL/min — ABNORMAL LOW (ref >60)
Glucose, Bld: 411 mg/dL — ABNORMAL HIGH (ref 70–99)
Potassium: 3.9 mmol/L (ref 3.5–5.1)
Sodium: 131 mmol/L — ABNORMAL LOW (ref 135–145)
Total Bilirubin: 0.6 mg/dL (ref 0.3–1.2)
Total Protein: 5.6 g/dL — ABNORMAL LOW (ref 6.5–8.1)

## 2021-06-19 LAB — TROPONIN I (HIGH SENSITIVITY)
Troponin I (High Sensitivity): 14 ng/L (ref <18)
Troponin I (High Sensitivity): 18 ng/L — ABNORMAL HIGH (ref <18)

## 2021-06-19 LAB — CBG MONITORING, ED
Glucose-Capillary: 55 mg/dL — ABNORMAL LOW (ref 70–99)
Glucose-Capillary: 158 mg/dL — ABNORMAL HIGH (ref 70–99)
Glucose-Capillary: 45 mg/dL — ABNORMAL LOW (ref 70–99)
Glucose-Capillary: 153 mg/dL — ABNORMAL HIGH (ref 70–99)

## 2021-06-19 LAB — MAGNESIUM: Magnesium: 1.6 mg/dL — ABNORMAL LOW (ref 1.7–2.4)

## 2021-06-19 LAB — CK: Total CK: 80 U/L (ref 38–234)

## 2021-06-19 MED ORDER — DEXTROSE 50 % IV SOLN
50.0000 mL | Freq: Once | INTRAVENOUS | Status: AC
  Administered 2021-06-19: 50 mL via INTRAVENOUS
  Filled 2021-06-19: qty 50

## 2021-06-19 MED ORDER — AMLODIPINE BESYLATE 5 MG PO TABS
5.0000 mg | ORAL_TABLET | Freq: Once | ORAL | Status: AC
  Administered 2021-06-19: 5 mg via ORAL
  Filled 2021-06-19: qty 1

## 2021-06-19 MED ORDER — INSULIN ASPART 100 UNIT/ML IJ SOLN
5.0000 [IU] | Freq: Once | INTRAMUSCULAR | Status: AC
  Administered 2021-06-19: 5 [IU] via INTRAVENOUS

## 2021-06-19 NOTE — ED Notes (Signed)
Pt A&Ox4. Provided with apple juice. MD made aware of CBG 55. Will reassess in about 30 mins.

## 2021-06-19 NOTE — ED Notes (Signed)
Patient verbalizes understanding of discharge instructions. Follow-up care reviewed. Opportunity for questioning and answers were provided. Armband removed by staff, pt discharged from ED via wheelchair to the lobby.

## 2021-06-22 ENCOUNTER — Telehealth: Payer: Self-pay

## 2021-06-22 DIAGNOSIS — Z992 Dependence on renal dialysis: Secondary | ICD-10-CM | POA: Diagnosis not present

## 2021-06-22 DIAGNOSIS — E785 Hyperlipidemia, unspecified: Secondary | ICD-10-CM | POA: Diagnosis not present

## 2021-06-22 DIAGNOSIS — N2581 Secondary hyperparathyroidism of renal origin: Secondary | ICD-10-CM | POA: Diagnosis not present

## 2021-06-22 DIAGNOSIS — D631 Anemia in chronic kidney disease: Secondary | ICD-10-CM | POA: Diagnosis not present

## 2021-06-22 DIAGNOSIS — T827XXA Infection and inflammatory reaction due to other cardiac and vascular devices, implants and grafts, initial encounter: Secondary | ICD-10-CM | POA: Diagnosis not present

## 2021-06-22 DIAGNOSIS — D509 Iron deficiency anemia, unspecified: Secondary | ICD-10-CM | POA: Diagnosis not present

## 2021-06-22 DIAGNOSIS — N186 End stage renal disease: Secondary | ICD-10-CM | POA: Diagnosis not present

## 2021-06-22 NOTE — Telephone Encounter (Signed)
Transition Care Management Unsuccessful Follow-up Telephone Call  Date of discharge and from where:  06/19/2021 from [facility]  Attempts:  1st Attempt  Reason for unsuccessful TCM follow-up call:  Unable to leave message

## 2021-06-23 NOTE — Telephone Encounter (Signed)
Transition Care Management Follow-up Telephone Call Date of discharge and from where: 06/19/2021 [facility] How have you been since you were released from the hospital? "Feeling a little better" Any questions or concerns? No  Items Reviewed: Did the pt receive and understand the discharge instructions provided? Yes  Medications obtained and verified?  N/A Other? No  Any new allergies since your discharge? No  Dietary orders reviewed? No Do you have support at home? Yes    Functional Questionnaire: (I = Independent and D = Dependent) ADLs: I  Bathing/Dressing- I  Meal Prep- I  Eating- I  Maintaining continence- I  Transferring/Ambulation- I  Managing Meds- I  Follow up appointments reviewed:  PCP Hospital f/u appt confirmed? No   - advised pt that due to nature of her visit to the ED, she should make an appointment to follow up [facility] f/u appt confirmed? No   Are transportation arrangements needed?  N/A If their condition worsens, is the pt aware to call PCP or go to the Emergency Dept.? Yes Was the patient provided with contact information for the PCP's office or ED? Yes Was to pt encouraged to call back with questions or concerns? Yes

## 2021-06-24 ENCOUNTER — Other Ambulatory Visit: Payer: Self-pay

## 2021-06-24 DIAGNOSIS — N2581 Secondary hyperparathyroidism of renal origin: Secondary | ICD-10-CM | POA: Diagnosis not present

## 2021-06-24 DIAGNOSIS — D631 Anemia in chronic kidney disease: Secondary | ICD-10-CM | POA: Diagnosis not present

## 2021-06-24 DIAGNOSIS — Z992 Dependence on renal dialysis: Secondary | ICD-10-CM | POA: Diagnosis not present

## 2021-06-24 DIAGNOSIS — E785 Hyperlipidemia, unspecified: Secondary | ICD-10-CM | POA: Diagnosis not present

## 2021-06-24 DIAGNOSIS — T827XXA Infection and inflammatory reaction due to other cardiac and vascular devices, implants and grafts, initial encounter: Secondary | ICD-10-CM | POA: Diagnosis not present

## 2021-06-24 DIAGNOSIS — E1029 Type 1 diabetes mellitus with other diabetic kidney complication: Secondary | ICD-10-CM | POA: Diagnosis not present

## 2021-06-24 DIAGNOSIS — N186 End stage renal disease: Secondary | ICD-10-CM | POA: Diagnosis not present

## 2021-06-24 DIAGNOSIS — E039 Hypothyroidism, unspecified: Secondary | ICD-10-CM | POA: Diagnosis not present

## 2021-06-24 DIAGNOSIS — D509 Iron deficiency anemia, unspecified: Secondary | ICD-10-CM | POA: Diagnosis not present

## 2021-06-24 NOTE — Patient Outreach (Signed)
Due on 07/01/21  Sodium Bicarbonate  650 mg  1   1  Amlodipine   10 mg  1     Escitalopram   20 mg  1     Pravastatin   20 mg     1  Furosemide   40 mg       Losartan   50 mg  1     Gabapentin   300 mg  2   2  Hydralazine   50 mg  '2 2  2  '$ Buspirone   15 mg     1  Novolog Injection  100/ml       Tresiba Flextouch  100/ml       Levothyroxine   175 mcg 1      Emgality Ondansetron Needles Dexcomm

## 2021-06-26 DIAGNOSIS — D631 Anemia in chronic kidney disease: Secondary | ICD-10-CM | POA: Diagnosis not present

## 2021-06-26 DIAGNOSIS — D509 Iron deficiency anemia, unspecified: Secondary | ICD-10-CM | POA: Diagnosis not present

## 2021-06-26 DIAGNOSIS — Z992 Dependence on renal dialysis: Secondary | ICD-10-CM | POA: Diagnosis not present

## 2021-06-26 DIAGNOSIS — E785 Hyperlipidemia, unspecified: Secondary | ICD-10-CM | POA: Diagnosis not present

## 2021-06-26 DIAGNOSIS — N2581 Secondary hyperparathyroidism of renal origin: Secondary | ICD-10-CM | POA: Diagnosis not present

## 2021-06-26 DIAGNOSIS — N186 End stage renal disease: Secondary | ICD-10-CM | POA: Diagnosis not present

## 2021-06-26 DIAGNOSIS — T827XXA Infection and inflammatory reaction due to other cardiac and vascular devices, implants and grafts, initial encounter: Secondary | ICD-10-CM | POA: Diagnosis not present

## 2021-06-28 ENCOUNTER — Encounter: Payer: Self-pay | Admitting: Internal Medicine

## 2021-06-28 MED ORDER — AMOXICILLIN-POT CLAVULANATE 500-125 MG PO TABS: 1.0000 | ORAL_TABLET | Freq: Every day | ORAL | 0 refills | Status: DC

## 2021-06-29 ENCOUNTER — Other Ambulatory Visit: Payer: Self-pay | Admitting: Internal Medicine

## 2021-06-29 DIAGNOSIS — E785 Hyperlipidemia, unspecified: Secondary | ICD-10-CM | POA: Diagnosis not present

## 2021-06-29 DIAGNOSIS — D631 Anemia in chronic kidney disease: Secondary | ICD-10-CM | POA: Diagnosis not present

## 2021-06-29 DIAGNOSIS — D509 Iron deficiency anemia, unspecified: Secondary | ICD-10-CM | POA: Diagnosis not present

## 2021-06-29 DIAGNOSIS — N2581 Secondary hyperparathyroidism of renal origin: Secondary | ICD-10-CM | POA: Diagnosis not present

## 2021-06-29 DIAGNOSIS — Z992 Dependence on renal dialysis: Secondary | ICD-10-CM | POA: Diagnosis not present

## 2021-06-29 DIAGNOSIS — T827XXA Infection and inflammatory reaction due to other cardiac and vascular devices, implants and grafts, initial encounter: Secondary | ICD-10-CM | POA: Diagnosis not present

## 2021-06-29 DIAGNOSIS — N186 End stage renal disease: Secondary | ICD-10-CM | POA: Diagnosis not present

## 2021-06-29 MED ORDER — INSULIN PEN NEEDLE 32G X 4 MM MISC: 11 refills | Status: DC

## 2021-06-29 NOTE — Patient Outreach (Signed)
Asked PCP for needles refills

## 2021-07-01 DIAGNOSIS — N186 End stage renal disease: Secondary | ICD-10-CM | POA: Diagnosis not present

## 2021-07-01 DIAGNOSIS — D509 Iron deficiency anemia, unspecified: Secondary | ICD-10-CM | POA: Diagnosis not present

## 2021-07-01 DIAGNOSIS — N2581 Secondary hyperparathyroidism of renal origin: Secondary | ICD-10-CM | POA: Diagnosis not present

## 2021-07-01 DIAGNOSIS — D631 Anemia in chronic kidney disease: Secondary | ICD-10-CM | POA: Diagnosis not present

## 2021-07-01 DIAGNOSIS — T827XXA Infection and inflammatory reaction due to other cardiac and vascular devices, implants and grafts, initial encounter: Secondary | ICD-10-CM | POA: Diagnosis not present

## 2021-07-01 DIAGNOSIS — E785 Hyperlipidemia, unspecified: Secondary | ICD-10-CM | POA: Diagnosis not present

## 2021-07-01 DIAGNOSIS — Z992 Dependence on renal dialysis: Secondary | ICD-10-CM | POA: Diagnosis not present

## 2021-07-03 DIAGNOSIS — D631 Anemia in chronic kidney disease: Secondary | ICD-10-CM | POA: Diagnosis not present

## 2021-07-03 DIAGNOSIS — D509 Iron deficiency anemia, unspecified: Secondary | ICD-10-CM | POA: Diagnosis not present

## 2021-07-03 DIAGNOSIS — N186 End stage renal disease: Secondary | ICD-10-CM | POA: Diagnosis not present

## 2021-07-03 DIAGNOSIS — T827XXA Infection and inflammatory reaction due to other cardiac and vascular devices, implants and grafts, initial encounter: Secondary | ICD-10-CM | POA: Diagnosis not present

## 2021-07-03 DIAGNOSIS — E785 Hyperlipidemia, unspecified: Secondary | ICD-10-CM | POA: Diagnosis not present

## 2021-07-03 DIAGNOSIS — Z992 Dependence on renal dialysis: Secondary | ICD-10-CM | POA: Diagnosis not present

## 2021-07-03 DIAGNOSIS — N2581 Secondary hyperparathyroidism of renal origin: Secondary | ICD-10-CM | POA: Diagnosis not present

## 2021-07-06 DIAGNOSIS — N186 End stage renal disease: Secondary | ICD-10-CM | POA: Diagnosis not present

## 2021-07-06 DIAGNOSIS — D631 Anemia in chronic kidney disease: Secondary | ICD-10-CM | POA: Diagnosis not present

## 2021-07-06 DIAGNOSIS — N2581 Secondary hyperparathyroidism of renal origin: Secondary | ICD-10-CM | POA: Diagnosis not present

## 2021-07-06 DIAGNOSIS — D509 Iron deficiency anemia, unspecified: Secondary | ICD-10-CM | POA: Diagnosis not present

## 2021-07-06 DIAGNOSIS — Z992 Dependence on renal dialysis: Secondary | ICD-10-CM | POA: Diagnosis not present

## 2021-07-06 DIAGNOSIS — E785 Hyperlipidemia, unspecified: Secondary | ICD-10-CM | POA: Diagnosis not present

## 2021-07-06 DIAGNOSIS — T827XXA Infection and inflammatory reaction due to other cardiac and vascular devices, implants and grafts, initial encounter: Secondary | ICD-10-CM | POA: Diagnosis not present

## 2021-07-08 DIAGNOSIS — N2581 Secondary hyperparathyroidism of renal origin: Secondary | ICD-10-CM | POA: Diagnosis not present

## 2021-07-08 DIAGNOSIS — D509 Iron deficiency anemia, unspecified: Secondary | ICD-10-CM | POA: Diagnosis not present

## 2021-07-08 DIAGNOSIS — Z992 Dependence on renal dialysis: Secondary | ICD-10-CM | POA: Diagnosis not present

## 2021-07-08 DIAGNOSIS — T827XXA Infection and inflammatory reaction due to other cardiac and vascular devices, implants and grafts, initial encounter: Secondary | ICD-10-CM | POA: Diagnosis not present

## 2021-07-08 DIAGNOSIS — D631 Anemia in chronic kidney disease: Secondary | ICD-10-CM | POA: Diagnosis not present

## 2021-07-08 DIAGNOSIS — E785 Hyperlipidemia, unspecified: Secondary | ICD-10-CM | POA: Diagnosis not present

## 2021-07-08 DIAGNOSIS — N186 End stage renal disease: Secondary | ICD-10-CM | POA: Diagnosis not present

## 2021-07-10 DIAGNOSIS — N2581 Secondary hyperparathyroidism of renal origin: Secondary | ICD-10-CM | POA: Diagnosis not present

## 2021-07-10 DIAGNOSIS — D509 Iron deficiency anemia, unspecified: Secondary | ICD-10-CM | POA: Diagnosis not present

## 2021-07-10 DIAGNOSIS — D631 Anemia in chronic kidney disease: Secondary | ICD-10-CM | POA: Diagnosis not present

## 2021-07-10 DIAGNOSIS — T827XXA Infection and inflammatory reaction due to other cardiac and vascular devices, implants and grafts, initial encounter: Secondary | ICD-10-CM | POA: Diagnosis not present

## 2021-07-10 DIAGNOSIS — N186 End stage renal disease: Secondary | ICD-10-CM | POA: Diagnosis not present

## 2021-07-10 DIAGNOSIS — Z992 Dependence on renal dialysis: Secondary | ICD-10-CM | POA: Diagnosis not present

## 2021-07-10 DIAGNOSIS — E785 Hyperlipidemia, unspecified: Secondary | ICD-10-CM | POA: Diagnosis not present

## 2021-07-12 DIAGNOSIS — Z992 Dependence on renal dialysis: Secondary | ICD-10-CM | POA: Diagnosis not present

## 2021-07-12 DIAGNOSIS — N2581 Secondary hyperparathyroidism of renal origin: Secondary | ICD-10-CM | POA: Diagnosis not present

## 2021-07-12 DIAGNOSIS — D509 Iron deficiency anemia, unspecified: Secondary | ICD-10-CM | POA: Diagnosis not present

## 2021-07-12 DIAGNOSIS — N186 End stage renal disease: Secondary | ICD-10-CM | POA: Diagnosis not present

## 2021-07-12 DIAGNOSIS — T827XXA Infection and inflammatory reaction due to other cardiac and vascular devices, implants and grafts, initial encounter: Secondary | ICD-10-CM | POA: Diagnosis not present

## 2021-07-12 DIAGNOSIS — D631 Anemia in chronic kidney disease: Secondary | ICD-10-CM | POA: Diagnosis not present

## 2021-07-12 DIAGNOSIS — E785 Hyperlipidemia, unspecified: Secondary | ICD-10-CM | POA: Diagnosis not present

## 2021-07-13 ENCOUNTER — Other Ambulatory Visit: Payer: Self-pay | Admitting: Internal Medicine

## 2021-07-13 DIAGNOSIS — N186 End stage renal disease: Secondary | ICD-10-CM | POA: Diagnosis not present

## 2021-07-14 MED ORDER — ONDANSETRON 4 MG PO TBDP: 4.0000 mg | ORAL_TABLET | Freq: Three times a day (TID) | ORAL | 0 refills | Status: DC | PRN

## 2021-07-15 DIAGNOSIS — E785 Hyperlipidemia, unspecified: Secondary | ICD-10-CM | POA: Diagnosis not present

## 2021-07-15 DIAGNOSIS — N2581 Secondary hyperparathyroidism of renal origin: Secondary | ICD-10-CM | POA: Diagnosis not present

## 2021-07-15 DIAGNOSIS — D631 Anemia in chronic kidney disease: Secondary | ICD-10-CM | POA: Diagnosis not present

## 2021-07-15 DIAGNOSIS — N186 End stage renal disease: Secondary | ICD-10-CM | POA: Diagnosis not present

## 2021-07-15 DIAGNOSIS — Z992 Dependence on renal dialysis: Secondary | ICD-10-CM | POA: Diagnosis not present

## 2021-07-15 DIAGNOSIS — D509 Iron deficiency anemia, unspecified: Secondary | ICD-10-CM | POA: Diagnosis not present

## 2021-07-15 DIAGNOSIS — T827XXA Infection and inflammatory reaction due to other cardiac and vascular devices, implants and grafts, initial encounter: Secondary | ICD-10-CM | POA: Diagnosis not present

## 2021-07-17 DIAGNOSIS — N2581 Secondary hyperparathyroidism of renal origin: Secondary | ICD-10-CM | POA: Diagnosis not present

## 2021-07-17 DIAGNOSIS — D509 Iron deficiency anemia, unspecified: Secondary | ICD-10-CM | POA: Diagnosis not present

## 2021-07-17 DIAGNOSIS — Z992 Dependence on renal dialysis: Secondary | ICD-10-CM | POA: Diagnosis not present

## 2021-07-17 DIAGNOSIS — T827XXA Infection and inflammatory reaction due to other cardiac and vascular devices, implants and grafts, initial encounter: Secondary | ICD-10-CM | POA: Diagnosis not present

## 2021-07-17 DIAGNOSIS — N186 End stage renal disease: Secondary | ICD-10-CM | POA: Diagnosis not present

## 2021-07-17 DIAGNOSIS — D631 Anemia in chronic kidney disease: Secondary | ICD-10-CM | POA: Diagnosis not present

## 2021-07-17 DIAGNOSIS — E785 Hyperlipidemia, unspecified: Secondary | ICD-10-CM | POA: Diagnosis not present

## 2021-07-18 DIAGNOSIS — Z992 Dependence on renal dialysis: Secondary | ICD-10-CM | POA: Diagnosis not present

## 2021-07-18 DIAGNOSIS — N186 End stage renal disease: Secondary | ICD-10-CM | POA: Diagnosis not present

## 2021-07-20 DIAGNOSIS — Z992 Dependence on renal dialysis: Secondary | ICD-10-CM | POA: Diagnosis not present

## 2021-07-20 DIAGNOSIS — N186 End stage renal disease: Secondary | ICD-10-CM | POA: Diagnosis not present

## 2021-07-20 DIAGNOSIS — D509 Iron deficiency anemia, unspecified: Secondary | ICD-10-CM | POA: Diagnosis not present

## 2021-07-20 DIAGNOSIS — N2581 Secondary hyperparathyroidism of renal origin: Secondary | ICD-10-CM | POA: Diagnosis not present

## 2021-07-20 DIAGNOSIS — D631 Anemia in chronic kidney disease: Secondary | ICD-10-CM | POA: Diagnosis not present

## 2021-07-22 DIAGNOSIS — N186 End stage renal disease: Secondary | ICD-10-CM | POA: Diagnosis not present

## 2021-07-22 DIAGNOSIS — E1029 Type 1 diabetes mellitus with other diabetic kidney complication: Secondary | ICD-10-CM | POA: Diagnosis not present

## 2021-07-22 DIAGNOSIS — D631 Anemia in chronic kidney disease: Secondary | ICD-10-CM | POA: Diagnosis not present

## 2021-07-22 DIAGNOSIS — E039 Hypothyroidism, unspecified: Secondary | ICD-10-CM | POA: Diagnosis not present

## 2021-07-22 DIAGNOSIS — Z992 Dependence on renal dialysis: Secondary | ICD-10-CM | POA: Diagnosis not present

## 2021-07-22 DIAGNOSIS — N2581 Secondary hyperparathyroidism of renal origin: Secondary | ICD-10-CM | POA: Diagnosis not present

## 2021-07-22 DIAGNOSIS — D509 Iron deficiency anemia, unspecified: Secondary | ICD-10-CM | POA: Diagnosis not present

## 2021-07-24 DIAGNOSIS — N2581 Secondary hyperparathyroidism of renal origin: Secondary | ICD-10-CM | POA: Diagnosis not present

## 2021-07-24 DIAGNOSIS — D509 Iron deficiency anemia, unspecified: Secondary | ICD-10-CM | POA: Diagnosis not present

## 2021-07-24 DIAGNOSIS — N186 End stage renal disease: Secondary | ICD-10-CM | POA: Diagnosis not present

## 2021-07-24 DIAGNOSIS — Z992 Dependence on renal dialysis: Secondary | ICD-10-CM | POA: Diagnosis not present

## 2021-07-24 DIAGNOSIS — D631 Anemia in chronic kidney disease: Secondary | ICD-10-CM | POA: Diagnosis not present

## 2021-07-26 ENCOUNTER — Other Ambulatory Visit: Payer: Self-pay | Admitting: Internal Medicine

## 2021-07-26 DIAGNOSIS — D631 Anemia in chronic kidney disease: Secondary | ICD-10-CM | POA: Diagnosis not present

## 2021-07-26 DIAGNOSIS — I10 Essential (primary) hypertension: Secondary | ICD-10-CM

## 2021-07-26 DIAGNOSIS — D509 Iron deficiency anemia, unspecified: Secondary | ICD-10-CM | POA: Diagnosis not present

## 2021-07-26 DIAGNOSIS — N2581 Secondary hyperparathyroidism of renal origin: Secondary | ICD-10-CM | POA: Diagnosis not present

## 2021-07-26 DIAGNOSIS — E1042 Type 1 diabetes mellitus with diabetic polyneuropathy: Secondary | ICD-10-CM

## 2021-07-26 DIAGNOSIS — N186 End stage renal disease: Secondary | ICD-10-CM | POA: Diagnosis not present

## 2021-07-26 DIAGNOSIS — Z992 Dependence on renal dialysis: Secondary | ICD-10-CM | POA: Diagnosis not present

## 2021-07-26 NOTE — Telephone Encounter (Signed)
Patient called and advised we received refill requests and it was noted last OV to return in 1 month that was never done, she verbalized understanding and agrees to the appointment. Appointment scheduled for Monday, 09/27/21 at 1410 with Dr. Wynetta Emery. Advised refills will be sent to cover until appointment.

## 2021-07-28 DIAGNOSIS — Z992 Dependence on renal dialysis: Secondary | ICD-10-CM | POA: Diagnosis not present

## 2021-07-28 DIAGNOSIS — N2581 Secondary hyperparathyroidism of renal origin: Secondary | ICD-10-CM | POA: Diagnosis not present

## 2021-07-28 DIAGNOSIS — D631 Anemia in chronic kidney disease: Secondary | ICD-10-CM | POA: Diagnosis not present

## 2021-07-28 DIAGNOSIS — D509 Iron deficiency anemia, unspecified: Secondary | ICD-10-CM | POA: Diagnosis not present

## 2021-07-28 DIAGNOSIS — N186 End stage renal disease: Secondary | ICD-10-CM | POA: Diagnosis not present

## 2021-07-30 DIAGNOSIS — D631 Anemia in chronic kidney disease: Secondary | ICD-10-CM | POA: Diagnosis not present

## 2021-07-30 DIAGNOSIS — N2581 Secondary hyperparathyroidism of renal origin: Secondary | ICD-10-CM | POA: Diagnosis not present

## 2021-07-30 DIAGNOSIS — Z992 Dependence on renal dialysis: Secondary | ICD-10-CM | POA: Diagnosis not present

## 2021-07-30 DIAGNOSIS — D509 Iron deficiency anemia, unspecified: Secondary | ICD-10-CM | POA: Diagnosis not present

## 2021-07-30 DIAGNOSIS — N186 End stage renal disease: Secondary | ICD-10-CM | POA: Diagnosis not present

## 2021-08-02 DIAGNOSIS — D631 Anemia in chronic kidney disease: Secondary | ICD-10-CM | POA: Diagnosis not present

## 2021-08-02 DIAGNOSIS — N186 End stage renal disease: Secondary | ICD-10-CM | POA: Diagnosis not present

## 2021-08-02 DIAGNOSIS — D509 Iron deficiency anemia, unspecified: Secondary | ICD-10-CM | POA: Diagnosis not present

## 2021-08-02 DIAGNOSIS — Z992 Dependence on renal dialysis: Secondary | ICD-10-CM | POA: Diagnosis not present

## 2021-08-02 DIAGNOSIS — N2581 Secondary hyperparathyroidism of renal origin: Secondary | ICD-10-CM | POA: Diagnosis not present

## 2021-08-04 DIAGNOSIS — N186 End stage renal disease: Secondary | ICD-10-CM | POA: Diagnosis not present

## 2021-08-04 DIAGNOSIS — D509 Iron deficiency anemia, unspecified: Secondary | ICD-10-CM | POA: Diagnosis not present

## 2021-08-04 DIAGNOSIS — Z992 Dependence on renal dialysis: Secondary | ICD-10-CM | POA: Diagnosis not present

## 2021-08-04 DIAGNOSIS — N2581 Secondary hyperparathyroidism of renal origin: Secondary | ICD-10-CM | POA: Diagnosis not present

## 2021-08-04 DIAGNOSIS — D631 Anemia in chronic kidney disease: Secondary | ICD-10-CM | POA: Diagnosis not present

## 2021-08-06 DIAGNOSIS — D631 Anemia in chronic kidney disease: Secondary | ICD-10-CM | POA: Diagnosis not present

## 2021-08-06 DIAGNOSIS — Z992 Dependence on renal dialysis: Secondary | ICD-10-CM | POA: Diagnosis not present

## 2021-08-06 DIAGNOSIS — N186 End stage renal disease: Secondary | ICD-10-CM | POA: Diagnosis not present

## 2021-08-06 DIAGNOSIS — N2581 Secondary hyperparathyroidism of renal origin: Secondary | ICD-10-CM | POA: Diagnosis not present

## 2021-08-06 DIAGNOSIS — D509 Iron deficiency anemia, unspecified: Secondary | ICD-10-CM | POA: Diagnosis not present

## 2021-08-09 DIAGNOSIS — N186 End stage renal disease: Secondary | ICD-10-CM | POA: Diagnosis not present

## 2021-08-09 DIAGNOSIS — D509 Iron deficiency anemia, unspecified: Secondary | ICD-10-CM | POA: Diagnosis not present

## 2021-08-09 DIAGNOSIS — N2581 Secondary hyperparathyroidism of renal origin: Secondary | ICD-10-CM | POA: Diagnosis not present

## 2021-08-09 DIAGNOSIS — Z992 Dependence on renal dialysis: Secondary | ICD-10-CM | POA: Diagnosis not present

## 2021-08-09 DIAGNOSIS — D631 Anemia in chronic kidney disease: Secondary | ICD-10-CM | POA: Diagnosis not present

## 2021-08-11 DIAGNOSIS — N2581 Secondary hyperparathyroidism of renal origin: Secondary | ICD-10-CM | POA: Diagnosis not present

## 2021-08-11 DIAGNOSIS — D509 Iron deficiency anemia, unspecified: Secondary | ICD-10-CM | POA: Diagnosis not present

## 2021-08-11 DIAGNOSIS — Z992 Dependence on renal dialysis: Secondary | ICD-10-CM | POA: Diagnosis not present

## 2021-08-11 DIAGNOSIS — D631 Anemia in chronic kidney disease: Secondary | ICD-10-CM | POA: Diagnosis not present

## 2021-08-11 DIAGNOSIS — N186 End stage renal disease: Secondary | ICD-10-CM | POA: Diagnosis not present

## 2021-08-13 DIAGNOSIS — N186 End stage renal disease: Secondary | ICD-10-CM | POA: Diagnosis not present

## 2021-08-13 DIAGNOSIS — Z992 Dependence on renal dialysis: Secondary | ICD-10-CM | POA: Diagnosis not present

## 2021-08-13 DIAGNOSIS — D509 Iron deficiency anemia, unspecified: Secondary | ICD-10-CM | POA: Diagnosis not present

## 2021-08-13 DIAGNOSIS — N2581 Secondary hyperparathyroidism of renal origin: Secondary | ICD-10-CM | POA: Diagnosis not present

## 2021-08-13 DIAGNOSIS — D631 Anemia in chronic kidney disease: Secondary | ICD-10-CM | POA: Diagnosis not present

## 2021-08-16 DIAGNOSIS — N2581 Secondary hyperparathyroidism of renal origin: Secondary | ICD-10-CM | POA: Diagnosis not present

## 2021-08-16 DIAGNOSIS — Z992 Dependence on renal dialysis: Secondary | ICD-10-CM | POA: Diagnosis not present

## 2021-08-16 DIAGNOSIS — N186 End stage renal disease: Secondary | ICD-10-CM | POA: Diagnosis not present

## 2021-08-16 DIAGNOSIS — D631 Anemia in chronic kidney disease: Secondary | ICD-10-CM | POA: Diagnosis not present

## 2021-08-16 DIAGNOSIS — D509 Iron deficiency anemia, unspecified: Secondary | ICD-10-CM | POA: Diagnosis not present

## 2021-08-18 DIAGNOSIS — D509 Iron deficiency anemia, unspecified: Secondary | ICD-10-CM | POA: Diagnosis not present

## 2021-08-18 DIAGNOSIS — N186 End stage renal disease: Secondary | ICD-10-CM | POA: Diagnosis not present

## 2021-08-18 DIAGNOSIS — D631 Anemia in chronic kidney disease: Secondary | ICD-10-CM | POA: Diagnosis not present

## 2021-08-18 DIAGNOSIS — Z992 Dependence on renal dialysis: Secondary | ICD-10-CM | POA: Diagnosis not present

## 2021-08-18 DIAGNOSIS — N2581 Secondary hyperparathyroidism of renal origin: Secondary | ICD-10-CM | POA: Diagnosis not present

## 2021-08-19 ENCOUNTER — Ambulatory Visit: Payer: Medicaid Other | Attending: Internal Medicine | Admitting: Internal Medicine

## 2021-08-19 ENCOUNTER — Other Ambulatory Visit: Payer: Self-pay

## 2021-08-19 ENCOUNTER — Encounter: Payer: Self-pay | Admitting: Internal Medicine

## 2021-08-19 DIAGNOSIS — J45909 Unspecified asthma, uncomplicated: Secondary | ICD-10-CM

## 2021-08-19 DIAGNOSIS — J209 Acute bronchitis, unspecified: Secondary | ICD-10-CM

## 2021-08-19 DIAGNOSIS — R590 Localized enlarged lymph nodes: Secondary | ICD-10-CM

## 2021-08-19 DIAGNOSIS — L210 Seborrhea capitis: Secondary | ICD-10-CM

## 2021-08-19 MED ORDER — KETOCONAZOLE 2 % EX SHAM: 1.0000 "application " | MEDICATED_SHAMPOO | CUTANEOUS | 0 refills | Status: DC

## 2021-08-19 MED ORDER — AZITHROMYCIN 250 MG PO TABS: ORAL_TABLET | ORAL | 0 refills | Status: DC

## 2021-08-19 MED ORDER — ALBUTEROL SULFATE HFA 108 (90 BASE) MCG/ACT IN AERS: 2.0000 | INHALATION_SPRAY | Freq: Four times a day (QID) | RESPIRATORY_TRACT | 3 refills | Status: AC | PRN

## 2021-08-19 MED ORDER — BENZONATATE 100 MG PO CAPS: 100.0000 mg | ORAL_CAPSULE | Freq: Two times a day (BID) | ORAL | 0 refills | Status: DC | PRN

## 2021-08-19 NOTE — Progress Notes (Signed)
Patient ID: Angela [name], female   DOB: [birth year], 35 y.o.   MRN: [id number] Virtual Visit via Telephone Note  I connected with Angela [name] on 08/19/2021 at 10:06 a.m by telephone and verified that I am speaking with the correct person using two identifiers  Location: Patient: home Provider: office  Participants: Myself Patient   I discussed the limitations, risks, security and privacy concerns of performing an evaluation and management service by telephone and the availability of in person appointments. I also discussed with the patient that there may be a patient responsible charge related to this service. The patient expressed understanding and agreed to proceed.   History of Present Illness: Pt with hx of DM type 1 with neuropathy, gastroparesis and nephropathy, HTN, CKD stage 5 on HD ACD/IDA, hypothyroid, HL, migraines  Thinks she has a repiratory bug C/o having cough productive of green phlegm x 4-5 days.  Assoc with chills and congestion.  No sore throat.  No loss of taste or smells.  Some SOB.  No recent sick contacts.  Did 2 home COVID test the last of which was done 2 days ago.  They were both negative.  Request RF on albuterol inhaler.  Requesting refill on her shampoo for her hair.  She states that I had prescribed it about 2 years ago when she had bumps on her scalp and for dandruff.  She does not recall the name of it.  She ran out of it several months ago.  Reports that she started getting a few bumps on her scalp again with bad dandruff.  Scalp itches a lot.     Tells me that she has had some small lymph nodes in the groin area for several months.  She has noticed a few more recently.   Outpatient Encounter Medications as of 08/19/2021  Medication Sig Note   amLODipine (NORVASC) 10 MG tablet Take 1 tablet (10 mg total) by mouth daily. Please make PCP appointment.    amoxicillin-clavulanate (AUGMENTIN) 500-125 MG tablet Take 1 tablet (500 mg total) by mouth daily.     busPIRone (BUSPAR) 15 MG tablet Take 1 tablet (15 mg total) by mouth at bedtime. (Patient taking differently: Take 15 mg by mouth in the morning.)    Continuous Blood Gluc Transmit (DEXCOM G6 TRANSMITTER) MISC See admin instructions.    doxycycline (VIBRA-TABS) 100 MG tablet Take 1 tablet (100 mg total) by mouth 2 (two) times daily.    escitalopram (LEXAPRO) 20 MG tablet Take 1 tablet (20 mg total) by mouth daily.    furosemide (LASIX) 40 MG tablet TAKE ONE TABLET BY MOUTH daily as needed    gabapentin (NEURONTIN) 300 MG capsule TAKE TWO CAPSULES BY MOUTH EVERY MORNING and TAKE TWO CAPSULES BY MOUTH EVERYDAY AT BEDTIME    Galcanezumab-gnlm (EMGALITY) 120 MG/ML SOAJ Inject 120 mg into the skin every 30 (thirty) days.    hydrALAZINE (APRESOLINE) 50 MG tablet Take 2 tablets (100 mg total) by mouth 3 (three) times daily.    insulin aspart (NOVOLOG) 100 UNIT/ML injection Inject into the skin as directed. Sliding Scale. Every 15 gm=1 unit 06/19/2021: Pt had 4 units today.   Insulin Pen Needle 32G X 4 MM MISC Use to inject insulin once a day.    Insulin Syringe-Needle U-100 (BD INSULIN SYRINGE ULTRAFINE) 31G X 15/64" 1 ML MISC Used to give daily insulin injections. (Patient not taking: Reported on 12/08/2020)    levothyroxine (SYNTHROID) 175 MCG tablet Take 175 mcg by mouth every morning.  losartan (COZAAR) 50 MG tablet Take 1 tablet (50 mg total) by mouth daily.    ondansetron (ZOFRAN-ODT) 4 MG disintegrating tablet Take 1 tablet (4 mg total) by mouth every 8 (eight) hours as needed for nausea or vomiting.    oxyCODONE (OXY IR/ROXICODONE) 5 MG immediate release tablet Take 5 mg by mouth every 4 (four) hours as needed for severe pain or moderate pain.    pantoprazole (PROTONIX) 40 MG tablet Take 40 mg by mouth daily.    pravastatin (PRAVACHOL) 20 MG tablet TAKE ONE TABLET BY MOUTH EVERYDAY AT BEDTIME    Rimegepant Sulfate (NURTEC) 75 MG TBDP Take 75 mg by mouth daily as needed. For migraines. Take as close  to onset of migraine as possible. One daily maximum.    TRESIBA FLEXTOUCH 100 UNIT/ML FlexTouch Pen Inject 12 Units into the skin daily as needed (high blood sugar).    [DISCONTINUED] metoCLOPramide (REGLAN) 5 MG tablet Take 1 tablet (5 mg total) by mouth 3 (three) times daily as needed for nausea or vomiting. (Patient not taking: Reported on 06/25/2020)    No facility-administered encounter medications on file as of 08/19/2021.      Observations/Objective: No direct observation done as this was a telephone visit.  Patient did not sound short of breath or congested.  She was able to talk in full sentences.  Assessment and Plan: 1. Bronchitis with asthma, acute We will treat her for bronchitis with Zithromax, refill on albuterol inhaler and Tessalon Perles to use as needed. - azithromycin (ZITHROMAX Z-PAK) 250 MG tablet; 2 tabs PO x 1 then 1 tab PO daily  Dispense: 6 each; Refill: 0 - albuterol (PROVENTIL HFA) 108 (90 Base) MCG/ACT inhaler; Inhale 2 puffs into the lungs every 6 (six) hours as needed for wheezing or shortness of breath.  Dispense: 8 g; Refill: 3 - benzonatate (TESSALON) 100 MG capsule; Take 1 capsule (100 mg total) by mouth 2 (two) times daily as needed for cough.  Dispense: 20 capsule; Refill: 0  2. Dandruff in adult - ketoconazole (NIZORAL) 2 % shampoo; Apply 1 application topically 2 (two) times a week. Wash off after each use  Dispense: 120 mL; Refill: 0  3. Inguinal lymphadenopathy Message sent to our schedulers to get her in within the next few weeks for further evaluation of this in person.   Follow Up Instructions: See #3 above   I discussed the assessment and treatment plan with the patient. The patient was provided an opportunity to ask questions and all were answered. The patient agreed with the plan and demonstrated an understanding of the instructions.   The patient was advised to call back or seek an in-person evaluation if the symptoms worsen or if the  condition fails to improve as anticipated.  I  Spent 8 minutes on this telephone encounter  Karle Plumber, MD

## 2021-08-20 DIAGNOSIS — N186 End stage renal disease: Secondary | ICD-10-CM | POA: Diagnosis not present

## 2021-08-20 DIAGNOSIS — Z992 Dependence on renal dialysis: Secondary | ICD-10-CM | POA: Diagnosis not present

## 2021-08-20 DIAGNOSIS — N2581 Secondary hyperparathyroidism of renal origin: Secondary | ICD-10-CM | POA: Diagnosis not present

## 2021-08-20 DIAGNOSIS — D509 Iron deficiency anemia, unspecified: Secondary | ICD-10-CM | POA: Diagnosis not present

## 2021-08-20 DIAGNOSIS — D631 Anemia in chronic kidney disease: Secondary | ICD-10-CM | POA: Diagnosis not present

## 2021-08-21 DIAGNOSIS — E162 Hypoglycemia, unspecified: Secondary | ICD-10-CM | POA: Diagnosis not present

## 2021-08-21 DIAGNOSIS — I10 Essential (primary) hypertension: Secondary | ICD-10-CM | POA: Diagnosis not present

## 2021-08-21 DIAGNOSIS — E161 Other hypoglycemia: Secondary | ICD-10-CM | POA: Diagnosis not present

## 2021-08-21 DIAGNOSIS — R404 Transient alteration of awareness: Secondary | ICD-10-CM | POA: Diagnosis not present

## 2021-08-23 DIAGNOSIS — D631 Anemia in chronic kidney disease: Secondary | ICD-10-CM | POA: Diagnosis not present

## 2021-08-23 DIAGNOSIS — N186 End stage renal disease: Secondary | ICD-10-CM | POA: Diagnosis not present

## 2021-08-23 DIAGNOSIS — D509 Iron deficiency anemia, unspecified: Secondary | ICD-10-CM | POA: Diagnosis not present

## 2021-08-23 DIAGNOSIS — Z992 Dependence on renal dialysis: Secondary | ICD-10-CM | POA: Diagnosis not present

## 2021-08-23 DIAGNOSIS — N2581 Secondary hyperparathyroidism of renal origin: Secondary | ICD-10-CM | POA: Diagnosis not present

## 2021-08-25 ENCOUNTER — Telehealth: Payer: Self-pay

## 2021-08-25 DIAGNOSIS — N2581 Secondary hyperparathyroidism of renal origin: Secondary | ICD-10-CM | POA: Diagnosis not present

## 2021-08-25 DIAGNOSIS — E1029 Type 1 diabetes mellitus with other diabetic kidney complication: Secondary | ICD-10-CM | POA: Diagnosis not present

## 2021-08-25 DIAGNOSIS — N186 End stage renal disease: Secondary | ICD-10-CM | POA: Diagnosis not present

## 2021-08-25 DIAGNOSIS — Z992 Dependence on renal dialysis: Secondary | ICD-10-CM | POA: Diagnosis not present

## 2021-08-25 DIAGNOSIS — E039 Hypothyroidism, unspecified: Secondary | ICD-10-CM | POA: Diagnosis not present

## 2021-08-25 DIAGNOSIS — D509 Iron deficiency anemia, unspecified: Secondary | ICD-10-CM | POA: Diagnosis not present

## 2021-08-25 DIAGNOSIS — D631 Anemia in chronic kidney disease: Secondary | ICD-10-CM | POA: Diagnosis not present

## 2021-08-25 NOTE — Telephone Encounter (Signed)
Pt has been scheduled for in person appointment and patient has been sent a [employer].

## 2021-08-25 NOTE — Telephone Encounter (Signed)
-----   Message from [name], MD sent at 08/19/2021 10:19 AM EDT ----- Regarding: please schedule ASAP for eval of lymphadenopathy

## 2021-08-27 DIAGNOSIS — N2581 Secondary hyperparathyroidism of renal origin: Secondary | ICD-10-CM | POA: Diagnosis not present

## 2021-08-27 DIAGNOSIS — N186 End stage renal disease: Secondary | ICD-10-CM | POA: Diagnosis not present

## 2021-08-27 DIAGNOSIS — Z992 Dependence on renal dialysis: Secondary | ICD-10-CM | POA: Diagnosis not present

## 2021-08-27 DIAGNOSIS — D631 Anemia in chronic kidney disease: Secondary | ICD-10-CM | POA: Diagnosis not present

## 2021-08-27 DIAGNOSIS — D509 Iron deficiency anemia, unspecified: Secondary | ICD-10-CM | POA: Diagnosis not present

## 2021-08-30 DIAGNOSIS — N2581 Secondary hyperparathyroidism of renal origin: Secondary | ICD-10-CM | POA: Diagnosis not present

## 2021-08-30 DIAGNOSIS — N186 End stage renal disease: Secondary | ICD-10-CM | POA: Diagnosis not present

## 2021-08-30 DIAGNOSIS — Z992 Dependence on renal dialysis: Secondary | ICD-10-CM | POA: Diagnosis not present

## 2021-08-30 DIAGNOSIS — D509 Iron deficiency anemia, unspecified: Secondary | ICD-10-CM | POA: Diagnosis not present

## 2021-08-30 DIAGNOSIS — D631 Anemia in chronic kidney disease: Secondary | ICD-10-CM | POA: Diagnosis not present

## 2021-09-01 DIAGNOSIS — N186 End stage renal disease: Secondary | ICD-10-CM | POA: Diagnosis not present

## 2021-09-01 DIAGNOSIS — D509 Iron deficiency anemia, unspecified: Secondary | ICD-10-CM | POA: Diagnosis not present

## 2021-09-01 DIAGNOSIS — Z992 Dependence on renal dialysis: Secondary | ICD-10-CM | POA: Diagnosis not present

## 2021-09-01 DIAGNOSIS — D631 Anemia in chronic kidney disease: Secondary | ICD-10-CM | POA: Diagnosis not present

## 2021-09-01 DIAGNOSIS — N2581 Secondary hyperparathyroidism of renal origin: Secondary | ICD-10-CM | POA: Diagnosis not present

## 2021-09-03 DIAGNOSIS — Z992 Dependence on renal dialysis: Secondary | ICD-10-CM | POA: Diagnosis not present

## 2021-09-03 DIAGNOSIS — N186 End stage renal disease: Secondary | ICD-10-CM | POA: Diagnosis not present

## 2021-09-03 DIAGNOSIS — D631 Anemia in chronic kidney disease: Secondary | ICD-10-CM | POA: Diagnosis not present

## 2021-09-03 DIAGNOSIS — N2581 Secondary hyperparathyroidism of renal origin: Secondary | ICD-10-CM | POA: Diagnosis not present

## 2021-09-03 DIAGNOSIS — D509 Iron deficiency anemia, unspecified: Secondary | ICD-10-CM | POA: Diagnosis not present

## 2021-09-06 ENCOUNTER — Ambulatory Visit: Payer: Medicaid Other | Admitting: Internal Medicine

## 2021-09-06 DIAGNOSIS — D509 Iron deficiency anemia, unspecified: Secondary | ICD-10-CM | POA: Diagnosis not present

## 2021-09-06 DIAGNOSIS — Z992 Dependence on renal dialysis: Secondary | ICD-10-CM | POA: Diagnosis not present

## 2021-09-06 DIAGNOSIS — D631 Anemia in chronic kidney disease: Secondary | ICD-10-CM | POA: Diagnosis not present

## 2021-09-06 DIAGNOSIS — N186 End stage renal disease: Secondary | ICD-10-CM | POA: Diagnosis not present

## 2021-09-06 DIAGNOSIS — N2581 Secondary hyperparathyroidism of renal origin: Secondary | ICD-10-CM | POA: Diagnosis not present

## 2021-09-07 ENCOUNTER — Ambulatory Visit: Payer: Medicaid Other | Admitting: Internal Medicine

## 2021-09-08 DIAGNOSIS — Z992 Dependence on renal dialysis: Secondary | ICD-10-CM | POA: Diagnosis not present

## 2021-09-08 DIAGNOSIS — N186 End stage renal disease: Secondary | ICD-10-CM | POA: Diagnosis not present

## 2021-09-08 DIAGNOSIS — N2581 Secondary hyperparathyroidism of renal origin: Secondary | ICD-10-CM | POA: Diagnosis not present

## 2021-09-08 DIAGNOSIS — D631 Anemia in chronic kidney disease: Secondary | ICD-10-CM | POA: Diagnosis not present

## 2021-09-08 DIAGNOSIS — D509 Iron deficiency anemia, unspecified: Secondary | ICD-10-CM | POA: Diagnosis not present

## 2021-09-10 DIAGNOSIS — N2581 Secondary hyperparathyroidism of renal origin: Secondary | ICD-10-CM | POA: Diagnosis not present

## 2021-09-10 DIAGNOSIS — N186 End stage renal disease: Secondary | ICD-10-CM | POA: Diagnosis not present

## 2021-09-10 DIAGNOSIS — D509 Iron deficiency anemia, unspecified: Secondary | ICD-10-CM | POA: Diagnosis not present

## 2021-09-10 DIAGNOSIS — D631 Anemia in chronic kidney disease: Secondary | ICD-10-CM | POA: Diagnosis not present

## 2021-09-10 DIAGNOSIS — Z992 Dependence on renal dialysis: Secondary | ICD-10-CM | POA: Diagnosis not present

## 2021-09-13 ENCOUNTER — Other Ambulatory Visit: Payer: Self-pay | Admitting: Internal Medicine

## 2021-09-13 DIAGNOSIS — Z992 Dependence on renal dialysis: Secondary | ICD-10-CM | POA: Diagnosis not present

## 2021-09-13 DIAGNOSIS — N186 End stage renal disease: Secondary | ICD-10-CM | POA: Diagnosis not present

## 2021-09-13 DIAGNOSIS — D631 Anemia in chronic kidney disease: Secondary | ICD-10-CM | POA: Diagnosis not present

## 2021-09-13 DIAGNOSIS — D509 Iron deficiency anemia, unspecified: Secondary | ICD-10-CM | POA: Diagnosis not present

## 2021-09-13 DIAGNOSIS — N2581 Secondary hyperparathyroidism of renal origin: Secondary | ICD-10-CM | POA: Diagnosis not present

## 2021-09-13 DIAGNOSIS — F418 Other specified anxiety disorders: Secondary | ICD-10-CM

## 2021-09-13 DIAGNOSIS — F331 Major depressive disorder, recurrent, moderate: Secondary | ICD-10-CM

## 2021-09-15 DIAGNOSIS — D509 Iron deficiency anemia, unspecified: Secondary | ICD-10-CM | POA: Diagnosis not present

## 2021-09-15 DIAGNOSIS — N2581 Secondary hyperparathyroidism of renal origin: Secondary | ICD-10-CM | POA: Diagnosis not present

## 2021-09-15 DIAGNOSIS — D631 Anemia in chronic kidney disease: Secondary | ICD-10-CM | POA: Diagnosis not present

## 2021-09-15 DIAGNOSIS — Z992 Dependence on renal dialysis: Secondary | ICD-10-CM | POA: Diagnosis not present

## 2021-09-15 DIAGNOSIS — N186 End stage renal disease: Secondary | ICD-10-CM | POA: Diagnosis not present

## 2021-09-17 DIAGNOSIS — D631 Anemia in chronic kidney disease: Secondary | ICD-10-CM | POA: Diagnosis not present

## 2021-09-17 DIAGNOSIS — N2581 Secondary hyperparathyroidism of renal origin: Secondary | ICD-10-CM | POA: Diagnosis not present

## 2021-09-17 DIAGNOSIS — Z992 Dependence on renal dialysis: Secondary | ICD-10-CM | POA: Diagnosis not present

## 2021-09-17 DIAGNOSIS — D509 Iron deficiency anemia, unspecified: Secondary | ICD-10-CM | POA: Diagnosis not present

## 2021-09-17 DIAGNOSIS — N186 End stage renal disease: Secondary | ICD-10-CM | POA: Diagnosis not present

## 2021-09-20 DIAGNOSIS — N2581 Secondary hyperparathyroidism of renal origin: Secondary | ICD-10-CM | POA: Diagnosis not present

## 2021-09-20 DIAGNOSIS — D509 Iron deficiency anemia, unspecified: Secondary | ICD-10-CM | POA: Diagnosis not present

## 2021-09-20 DIAGNOSIS — Z23 Encounter for immunization: Secondary | ICD-10-CM | POA: Diagnosis not present

## 2021-09-20 DIAGNOSIS — N186 End stage renal disease: Secondary | ICD-10-CM | POA: Diagnosis not present

## 2021-09-20 DIAGNOSIS — Z992 Dependence on renal dialysis: Secondary | ICD-10-CM | POA: Diagnosis not present

## 2021-09-20 DIAGNOSIS — D631 Anemia in chronic kidney disease: Secondary | ICD-10-CM | POA: Diagnosis not present

## 2021-09-20 DIAGNOSIS — E785 Hyperlipidemia, unspecified: Secondary | ICD-10-CM | POA: Diagnosis not present

## 2021-09-22 ENCOUNTER — Other Ambulatory Visit: Payer: Self-pay | Admitting: Internal Medicine

## 2021-09-22 DIAGNOSIS — I10 Essential (primary) hypertension: Secondary | ICD-10-CM

## 2021-09-22 DIAGNOSIS — E785 Hyperlipidemia, unspecified: Secondary | ICD-10-CM | POA: Diagnosis not present

## 2021-09-22 DIAGNOSIS — Z992 Dependence on renal dialysis: Secondary | ICD-10-CM | POA: Diagnosis not present

## 2021-09-22 DIAGNOSIS — E1029 Type 1 diabetes mellitus with other diabetic kidney complication: Secondary | ICD-10-CM

## 2021-09-22 DIAGNOSIS — N2581 Secondary hyperparathyroidism of renal origin: Secondary | ICD-10-CM | POA: Diagnosis not present

## 2021-09-22 DIAGNOSIS — D509 Iron deficiency anemia, unspecified: Secondary | ICD-10-CM | POA: Diagnosis not present

## 2021-09-22 DIAGNOSIS — N186 End stage renal disease: Secondary | ICD-10-CM | POA: Diagnosis not present

## 2021-09-22 DIAGNOSIS — D631 Anemia in chronic kidney disease: Secondary | ICD-10-CM | POA: Diagnosis not present

## 2021-09-22 DIAGNOSIS — Z23 Encounter for immunization: Secondary | ICD-10-CM | POA: Diagnosis not present

## 2021-09-22 DIAGNOSIS — E039 Hypothyroidism, unspecified: Secondary | ICD-10-CM | POA: Diagnosis not present

## 2021-09-22 NOTE — Telephone Encounter (Signed)
Requested medications are due for refill today.  yes  Requested medications are on the active medications list.  yes  Last refill. Hydralazine 07/26/2021, Furosemide 07/26/2021, Zofran 07/14/2021, Novolog historical medication  Future visit scheduled.   With Pharmacy 01/07/22  Notes to clinic.  Zofran -not delegated. Novolog - historical medication. Others failed protocol d/t labs. Pt has cancelled or missed her last few appointments.

## 2021-09-24 DIAGNOSIS — Z23 Encounter for immunization: Secondary | ICD-10-CM | POA: Diagnosis not present

## 2021-09-24 DIAGNOSIS — D509 Iron deficiency anemia, unspecified: Secondary | ICD-10-CM | POA: Diagnosis not present

## 2021-09-24 DIAGNOSIS — N186 End stage renal disease: Secondary | ICD-10-CM | POA: Diagnosis not present

## 2021-09-24 DIAGNOSIS — E785 Hyperlipidemia, unspecified: Secondary | ICD-10-CM | POA: Diagnosis not present

## 2021-09-24 DIAGNOSIS — N2581 Secondary hyperparathyroidism of renal origin: Secondary | ICD-10-CM | POA: Diagnosis not present

## 2021-09-24 DIAGNOSIS — D631 Anemia in chronic kidney disease: Secondary | ICD-10-CM | POA: Diagnosis not present

## 2021-09-24 DIAGNOSIS — Z992 Dependence on renal dialysis: Secondary | ICD-10-CM | POA: Diagnosis not present

## 2021-09-27 ENCOUNTER — Ambulatory Visit: Payer: Medicaid Other | Admitting: Internal Medicine

## 2021-09-27 DIAGNOSIS — N186 End stage renal disease: Secondary | ICD-10-CM | POA: Diagnosis not present

## 2021-09-27 DIAGNOSIS — N2581 Secondary hyperparathyroidism of renal origin: Secondary | ICD-10-CM | POA: Diagnosis not present

## 2021-09-27 DIAGNOSIS — Z23 Encounter for immunization: Secondary | ICD-10-CM | POA: Diagnosis not present

## 2021-09-27 DIAGNOSIS — E785 Hyperlipidemia, unspecified: Secondary | ICD-10-CM | POA: Diagnosis not present

## 2021-09-27 DIAGNOSIS — D509 Iron deficiency anemia, unspecified: Secondary | ICD-10-CM | POA: Diagnosis not present

## 2021-09-27 DIAGNOSIS — Z992 Dependence on renal dialysis: Secondary | ICD-10-CM | POA: Diagnosis not present

## 2021-09-27 DIAGNOSIS — D631 Anemia in chronic kidney disease: Secondary | ICD-10-CM | POA: Diagnosis not present

## 2021-09-29 DIAGNOSIS — Z992 Dependence on renal dialysis: Secondary | ICD-10-CM | POA: Diagnosis not present

## 2021-09-29 DIAGNOSIS — Z23 Encounter for immunization: Secondary | ICD-10-CM | POA: Diagnosis not present

## 2021-09-29 DIAGNOSIS — N2581 Secondary hyperparathyroidism of renal origin: Secondary | ICD-10-CM | POA: Diagnosis not present

## 2021-09-29 DIAGNOSIS — D509 Iron deficiency anemia, unspecified: Secondary | ICD-10-CM | POA: Diagnosis not present

## 2021-09-29 DIAGNOSIS — E785 Hyperlipidemia, unspecified: Secondary | ICD-10-CM | POA: Diagnosis not present

## 2021-09-29 DIAGNOSIS — N186 End stage renal disease: Secondary | ICD-10-CM | POA: Diagnosis not present

## 2021-09-29 DIAGNOSIS — D631 Anemia in chronic kidney disease: Secondary | ICD-10-CM | POA: Diagnosis not present

## 2021-10-01 DIAGNOSIS — E785 Hyperlipidemia, unspecified: Secondary | ICD-10-CM | POA: Diagnosis not present

## 2021-10-01 DIAGNOSIS — D631 Anemia in chronic kidney disease: Secondary | ICD-10-CM | POA: Diagnosis not present

## 2021-10-01 DIAGNOSIS — N186 End stage renal disease: Secondary | ICD-10-CM | POA: Diagnosis not present

## 2021-10-01 DIAGNOSIS — Z23 Encounter for immunization: Secondary | ICD-10-CM | POA: Diagnosis not present

## 2021-10-01 DIAGNOSIS — N2581 Secondary hyperparathyroidism of renal origin: Secondary | ICD-10-CM | POA: Diagnosis not present

## 2021-10-01 DIAGNOSIS — D509 Iron deficiency anemia, unspecified: Secondary | ICD-10-CM | POA: Diagnosis not present

## 2021-10-01 DIAGNOSIS — Z992 Dependence on renal dialysis: Secondary | ICD-10-CM | POA: Diagnosis not present

## 2021-10-04 DIAGNOSIS — N2581 Secondary hyperparathyroidism of renal origin: Secondary | ICD-10-CM | POA: Diagnosis not present

## 2021-10-04 DIAGNOSIS — N186 End stage renal disease: Secondary | ICD-10-CM | POA: Diagnosis not present

## 2021-10-04 DIAGNOSIS — D509 Iron deficiency anemia, unspecified: Secondary | ICD-10-CM | POA: Diagnosis not present

## 2021-10-04 DIAGNOSIS — Z23 Encounter for immunization: Secondary | ICD-10-CM | POA: Diagnosis not present

## 2021-10-04 DIAGNOSIS — Z992 Dependence on renal dialysis: Secondary | ICD-10-CM | POA: Diagnosis not present

## 2021-10-04 DIAGNOSIS — E785 Hyperlipidemia, unspecified: Secondary | ICD-10-CM | POA: Diagnosis not present

## 2021-10-04 DIAGNOSIS — D631 Anemia in chronic kidney disease: Secondary | ICD-10-CM | POA: Diagnosis not present

## 2021-10-05 ENCOUNTER — Encounter (HOSPITAL_COMMUNITY): Payer: Self-pay | Admitting: Emergency Medicine

## 2021-10-05 ENCOUNTER — Encounter: Payer: Self-pay | Admitting: Internal Medicine

## 2021-10-05 ENCOUNTER — Other Ambulatory Visit: Payer: Self-pay

## 2021-10-05 ENCOUNTER — Emergency Department (HOSPITAL_BASED_OUTPATIENT_CLINIC_OR_DEPARTMENT_OTHER): Payer: Medicaid Other

## 2021-10-05 ENCOUNTER — Emergency Department (HOSPITAL_COMMUNITY)
Admission: EM | Admit: 2021-10-05 | Discharge: 2021-10-05 | Disposition: A | Discharge status: Home / Self Care | Payer: Medicaid Other | Attending: Emergency Medicine | Admitting: Emergency Medicine

## 2021-10-05 ENCOUNTER — Emergency Department (HOSPITAL_COMMUNITY): Payer: Medicaid Other

## 2021-10-05 DIAGNOSIS — M79604 Pain in right leg: Secondary | ICD-10-CM | POA: Diagnosis not present

## 2021-10-05 DIAGNOSIS — R109 Unspecified abdominal pain: Secondary | ICD-10-CM | POA: Insufficient documentation

## 2021-10-05 DIAGNOSIS — J45909 Unspecified asthma, uncomplicated: Secondary | ICD-10-CM | POA: Insufficient documentation

## 2021-10-05 DIAGNOSIS — R5381 Other malaise: Secondary | ICD-10-CM | POA: Diagnosis not present

## 2021-10-05 DIAGNOSIS — I10 Essential (primary) hypertension: Secondary | ICD-10-CM | POA: Diagnosis not present

## 2021-10-05 DIAGNOSIS — K920 Hematemesis: Secondary | ICD-10-CM

## 2021-10-05 DIAGNOSIS — I120 Hypertensive chronic kidney disease with stage 5 chronic kidney disease or end stage renal disease: Secondary | ICD-10-CM | POA: Diagnosis not present

## 2021-10-05 DIAGNOSIS — Z79899 Other long term (current) drug therapy: Secondary | ICD-10-CM | POA: Insufficient documentation

## 2021-10-05 DIAGNOSIS — R079 Chest pain, unspecified: Secondary | ICD-10-CM | POA: Diagnosis not present

## 2021-10-05 DIAGNOSIS — R042 Hemoptysis: Secondary | ICD-10-CM | POA: Diagnosis not present

## 2021-10-05 DIAGNOSIS — R112 Nausea with vomiting, unspecified: Secondary | ICD-10-CM | POA: Diagnosis present

## 2021-10-05 DIAGNOSIS — Z992 Dependence on renal dialysis: Secondary | ICD-10-CM | POA: Insufficient documentation

## 2021-10-05 DIAGNOSIS — R0789 Other chest pain: Secondary | ICD-10-CM | POA: Diagnosis not present

## 2021-10-05 DIAGNOSIS — E1022 Type 1 diabetes mellitus with diabetic chronic kidney disease: Secondary | ICD-10-CM | POA: Diagnosis not present

## 2021-10-05 DIAGNOSIS — Z794 Long term (current) use of insulin: Secondary | ICD-10-CM | POA: Insufficient documentation

## 2021-10-05 DIAGNOSIS — N186 End stage renal disease: Secondary | ICD-10-CM | POA: Insufficient documentation

## 2021-10-05 DIAGNOSIS — R410 Disorientation, unspecified: Secondary | ICD-10-CM | POA: Diagnosis not present

## 2021-10-05 DIAGNOSIS — Z87891 Personal history of nicotine dependence: Secondary | ICD-10-CM | POA: Insufficient documentation

## 2021-10-05 DIAGNOSIS — R2241 Localized swelling, mass and lump, right lower limb: Secondary | ICD-10-CM | POA: Insufficient documentation

## 2021-10-05 DIAGNOSIS — E039 Hypothyroidism, unspecified: Secondary | ICD-10-CM | POA: Insufficient documentation

## 2021-10-05 DIAGNOSIS — R110 Nausea: Secondary | ICD-10-CM | POA: Diagnosis not present

## 2021-10-05 LAB — CBC WITH DIFFERENTIAL/PLATELET
Abs Immature Granulocytes: 0.03 10*3/uL (ref 0.00–0.07)
Basophils Absolute: 0 10*3/uL (ref 0.0–0.1)
Basophils Relative: 1 %
Eosinophils Absolute: 0.5 10*3/uL (ref 0.0–0.5)
Eosinophils Relative: 8 %
HCT: 36.4 % (ref 36.0–46.0)
Hemoglobin: 10.7 g/dL — ABNORMAL LOW (ref 12.0–15.0)
Immature Granulocytes: 1 %
Lymphocytes Relative: 27 %
Lymphs Abs: 1.6 10*3/uL (ref 0.7–4.0)
MCH: 28.6 pg (ref 26.0–34.0)
MCHC: 29.4 g/dL — ABNORMAL LOW (ref 30.0–36.0)
MCV: 97.3 fL (ref 80.0–100.0)
Monocytes Absolute: 0.5 10*3/uL (ref 0.1–1.0)
Monocytes Relative: 8 %
Neutro Abs: 3.4 10*3/uL (ref 1.7–7.7)
Neutrophils Relative %: 55 %
Platelets: 80 10*3/uL — ABNORMAL LOW (ref 150–400)
RBC: 3.74 MIL/uL — ABNORMAL LOW (ref 3.87–5.11)
RDW: 14.6 % (ref 11.5–15.5)
WBC: 6 10*3/uL (ref 4.0–10.5)
nRBC: 0 % (ref 0.0–0.2)

## 2021-10-05 LAB — MAGNESIUM: Magnesium: 1.9 mg/dL (ref 1.7–2.4)

## 2021-10-05 LAB — COMPREHENSIVE METABOLIC PANEL
ALT: 16 U/L (ref 0–44)
AST: 38 U/L (ref 15–41)
Albumin: 3.7 g/dL (ref 3.5–5.0)
Alkaline Phosphatase: 112 U/L (ref 38–126)
Anion gap: 10 (ref 5–15)
BUN: 17 mg/dL (ref 6–20)
CO2: 28 mmol/L (ref 22–32)
Calcium: 9.7 mg/dL (ref 8.9–10.3)
Chloride: 97 mmol/L — ABNORMAL LOW (ref 98–111)
Creatinine, Ser: 5.68 mg/dL — ABNORMAL HIGH (ref 0.44–1.00)
GFR, Estimated: 9 mL/min — ABNORMAL LOW (ref >60)
Glucose, Bld: 88 mg/dL (ref 70–99)
Potassium: 3.8 mmol/L (ref 3.5–5.1)
Sodium: 135 mmol/L (ref 135–145)
Total Bilirubin: 0.6 mg/dL (ref 0.3–1.2)
Total Protein: 6.9 g/dL (ref 6.5–8.1)

## 2021-10-05 LAB — CBG MONITORING, ED
Glucose-Capillary: 52 mg/dL — ABNORMAL LOW (ref 70–99)
Glucose-Capillary: 115 mg/dL — ABNORMAL HIGH (ref 70–99)

## 2021-10-05 LAB — TROPONIN I (HIGH SENSITIVITY)
Troponin I (High Sensitivity): 12 ng/L (ref <18)
Troponin I (High Sensitivity): 11 ng/L (ref <18)

## 2021-10-05 LAB — I-STAT BETA HCG BLOOD, ED (MC, WL, AP ONLY): I-stat hCG, quantitative: <5 m[IU]/mL (ref <5)

## 2021-10-05 LAB — LIPASE, BLOOD: Lipase: 73 U/L — ABNORMAL HIGH (ref 11–51)

## 2021-10-05 MED ORDER — ONDANSETRON 4 MG PO TBDP: 4.0000 mg | ORAL_TABLET | Freq: Three times a day (TID) | ORAL | 0 refills | Status: DC | PRN

## 2021-10-05 NOTE — ED Notes (Signed)
Pt transported to imaging.

## 2021-10-05 NOTE — ED Triage Notes (Signed)
Pt reports she is not feeling well, started throwing up blood.  Pt is a dialysis pt (Monday, Wed to Thursday) last treatment was Monday and complete. Pt has had right leg pain X2 weeks, with swelling.  Coughing w/ chest tightness.

## 2021-10-05 NOTE — Progress Notes (Signed)
Lower extremity venous has been completed.   Preliminary results in CV Proc.   Jinny Blossom Mehkai Gallo 10/05/2021 8:31 AM

## 2021-10-05 NOTE — Discharge Instructions (Signed)
As we discussed your blood clot scan of your leg did not show any blood clots, I have minimal clinical suspicion that the blood in your vomit was secondary to a blood clot in your lungs based on your presentation today.  Your blood work was otherwise reassuring, your nausea and vomiting have resolved after some rest and time.  It is possible that recent fluid overload in addition to a possible viral illness led to a recent cough which may have triggered a minor bronchitis to the most common cause of bloody vomit.  Please return if you have worsening chest pain, shortness of breath.  Please follow-up with your neurologist as planned to discuss your seizure-like activity.  Please follow-up for your dialysis appointments as normal.
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
## 2021-10-05 NOTE — ED Provider Notes (Signed)
Emergency Medicine Provider Triage Evaluation Note  Angela [name] , a 35 y.o. female  was evaluated in triage.  Pt here with multiple complaints. Reports 2 weeks of RLE pain, cramping/aching with associated swelling and has also had some chest tightness and abdominal discomfort. Tonight started to have N/V and her culmination of sxs brought her to the ED. Last dialysis was 10/17- fully dialyzed  Review of Systems  Positive: Myalgia, chest pain, abdominal pain, N/V Negative: Hemoptysis, syncope, fever  Physical Exam  BP (!) 206/115 (BP Location: Right Arm)   Pulse 85   Temp 98.2 F (36.8 C)   Resp 18   Ht 5' 4.25" (1.632 m)   Wt 57.6 kg   SpO2 100%   BMI 21.63 kg/m  Gen:   Awake, no distress   Resp:  Normal effort  MSK:   Moves extremities without difficulty  Other:  Heart RRR. 2+ symmetric DP pulses. RLE tender to palpation to the thigh and lower leg. Compartments are soft.   Medical Decision Making  Medically screening exam initiated at 3:32 AM.  Appropriate orders placed.  Angela [name] was informed that the remainder of the evaluation will be completed by another provider, this initial triage assessment does not replace that evaluation, and the importance of remaining in the ED until their evaluation is complete.  Leg pain, chest pain.  EKG, labs, CXR, and RLE venous duplex ordered.    Angela [name] 10/05/21 0417    Angela Pew, MD 10/05/21 [phone]

## 2021-10-06 ENCOUNTER — Telehealth: Payer: Self-pay

## 2021-10-06 DIAGNOSIS — N186 End stage renal disease: Secondary | ICD-10-CM | POA: Diagnosis not present

## 2021-10-06 DIAGNOSIS — Z992 Dependence on renal dialysis: Secondary | ICD-10-CM | POA: Diagnosis not present

## 2021-10-06 DIAGNOSIS — D509 Iron deficiency anemia, unspecified: Secondary | ICD-10-CM | POA: Diagnosis not present

## 2021-10-06 DIAGNOSIS — N2581 Secondary hyperparathyroidism of renal origin: Secondary | ICD-10-CM | POA: Diagnosis not present

## 2021-10-06 DIAGNOSIS — E785 Hyperlipidemia, unspecified: Secondary | ICD-10-CM | POA: Diagnosis not present

## 2021-10-06 DIAGNOSIS — D631 Anemia in chronic kidney disease: Secondary | ICD-10-CM | POA: Diagnosis not present

## 2021-10-06 DIAGNOSIS — Z23 Encounter for immunization: Secondary | ICD-10-CM | POA: Diagnosis not present

## 2021-10-06 NOTE — Telephone Encounter (Signed)
Transition Care Management Unsuccessful Follow-up Telephone Call  Date of discharge and from where:  09/18/2021[facility]  Attempts:  1st Attempt  Reason for unsuccessful TCM follow-up call:  Left voice message

## 2021-10-07 ENCOUNTER — Encounter: Payer: Self-pay | Admitting: Internal Medicine

## 2021-10-07 NOTE — Telephone Encounter (Signed)
Transition Care Management Follow-up Telephone Call Date of discharge and from where: 10/05/2021 from [facility] How have you been since you were released from the hospital? Pt stated that she is feeling much better. Pt has been able to eat and keep fluids down without issue. Pt has been taking the antiemetic that rx'ed by the ED.  Any questions or concerns? No  Items Reviewed: Did the pt receive and understand the discharge instructions provided? Yes  Medications obtained and verified? Yes  Other? No  Any new allergies since your discharge? No  Dietary orders reviewed? No Do you have support at home? Yes   Functional Questionnaire: (I = Independent and D = Dependent) ADLs: I  Bathing/Dressing- I  Meal Prep- I  Eating- I  Maintaining continence- I  Transferring/Ambulation- I  Managing Meds- I   Follow up appointments reviewed:  PCP Hospital f/u appt confirmed? No  Pt stated that she would reach out to pcp if her symptoms come back.  [facility] f/u appt confirmed? No   Are transportation arrangements needed? No  If their condition worsens, is the pt aware to call PCP or go to the Emergency Dept.? Yes Was the patient provided with contact information for the PCP's office or ED? Yes Was to pt encouraged to call back with questions or concerns? Yes

## 2021-10-08 DIAGNOSIS — Z23 Encounter for immunization: Secondary | ICD-10-CM | POA: Diagnosis not present

## 2021-10-08 DIAGNOSIS — D509 Iron deficiency anemia, unspecified: Secondary | ICD-10-CM | POA: Diagnosis not present

## 2021-10-08 DIAGNOSIS — Z992 Dependence on renal dialysis: Secondary | ICD-10-CM | POA: Diagnosis not present

## 2021-10-08 DIAGNOSIS — E785 Hyperlipidemia, unspecified: Secondary | ICD-10-CM | POA: Diagnosis not present

## 2021-10-08 DIAGNOSIS — D631 Anemia in chronic kidney disease: Secondary | ICD-10-CM | POA: Diagnosis not present

## 2021-10-08 DIAGNOSIS — N186 End stage renal disease: Secondary | ICD-10-CM | POA: Diagnosis not present

## 2021-10-08 DIAGNOSIS — N2581 Secondary hyperparathyroidism of renal origin: Secondary | ICD-10-CM | POA: Diagnosis not present

## 2021-10-11 DIAGNOSIS — D631 Anemia in chronic kidney disease: Secondary | ICD-10-CM | POA: Diagnosis not present

## 2021-10-11 DIAGNOSIS — D509 Iron deficiency anemia, unspecified: Secondary | ICD-10-CM | POA: Diagnosis not present

## 2021-10-11 DIAGNOSIS — N2581 Secondary hyperparathyroidism of renal origin: Secondary | ICD-10-CM | POA: Diagnosis not present

## 2021-10-11 DIAGNOSIS — Z23 Encounter for immunization: Secondary | ICD-10-CM | POA: Diagnosis not present

## 2021-10-11 DIAGNOSIS — E785 Hyperlipidemia, unspecified: Secondary | ICD-10-CM | POA: Diagnosis not present

## 2021-10-11 DIAGNOSIS — N186 End stage renal disease: Secondary | ICD-10-CM | POA: Diagnosis not present

## 2021-10-11 DIAGNOSIS — Z992 Dependence on renal dialysis: Secondary | ICD-10-CM | POA: Diagnosis not present

## 2021-10-13 DIAGNOSIS — N2581 Secondary hyperparathyroidism of renal origin: Secondary | ICD-10-CM | POA: Diagnosis not present

## 2021-10-13 DIAGNOSIS — Z992 Dependence on renal dialysis: Secondary | ICD-10-CM | POA: Diagnosis not present

## 2021-10-13 DIAGNOSIS — N186 End stage renal disease: Secondary | ICD-10-CM | POA: Diagnosis not present

## 2021-10-13 DIAGNOSIS — Z23 Encounter for immunization: Secondary | ICD-10-CM | POA: Diagnosis not present

## 2021-10-13 DIAGNOSIS — D509 Iron deficiency anemia, unspecified: Secondary | ICD-10-CM | POA: Diagnosis not present

## 2021-10-13 DIAGNOSIS — D631 Anemia in chronic kidney disease: Secondary | ICD-10-CM | POA: Diagnosis not present

## 2021-10-13 DIAGNOSIS — E785 Hyperlipidemia, unspecified: Secondary | ICD-10-CM | POA: Diagnosis not present

## 2021-10-15 ENCOUNTER — Emergency Department (HOSPITAL_COMMUNITY): Payer: Medicaid Other

## 2021-10-15 ENCOUNTER — Encounter (HOSPITAL_COMMUNITY): Payer: Self-pay | Admitting: Internal Medicine

## 2021-10-15 ENCOUNTER — Inpatient Hospital Stay (HOSPITAL_COMMUNITY)
Admission: EM | Admit: 2021-10-15 | Discharge: 2021-10-19 | DRG: 638 | Disposition: A | Discharge status: Home / Self Care | Payer: Medicaid Other | Attending: Family Medicine | Admitting: Family Medicine

## 2021-10-15 ENCOUNTER — Other Ambulatory Visit: Payer: Self-pay

## 2021-10-15 DIAGNOSIS — R9431 Abnormal electrocardiogram [ECG] [EKG]: Secondary | ICD-10-CM | POA: Diagnosis not present

## 2021-10-15 DIAGNOSIS — E1065 Type 1 diabetes mellitus with hyperglycemia: Secondary | ICD-10-CM | POA: Diagnosis not present

## 2021-10-15 DIAGNOSIS — Z79899 Other long term (current) drug therapy: Secondary | ICD-10-CM

## 2021-10-15 DIAGNOSIS — I120 Hypertensive chronic kidney disease with stage 5 chronic kidney disease or end stage renal disease: Secondary | ICD-10-CM | POA: Diagnosis not present

## 2021-10-15 DIAGNOSIS — R569 Unspecified convulsions: Secondary | ICD-10-CM

## 2021-10-15 DIAGNOSIS — K219 Gastro-esophageal reflux disease without esophagitis: Secondary | ICD-10-CM | POA: Diagnosis not present

## 2021-10-15 DIAGNOSIS — G43909 Migraine, unspecified, not intractable, without status migrainosus: Secondary | ICD-10-CM | POA: Diagnosis present

## 2021-10-15 DIAGNOSIS — E1069 Type 1 diabetes mellitus with other specified complication: Secondary | ICD-10-CM | POA: Diagnosis not present

## 2021-10-15 DIAGNOSIS — Z794 Long term (current) use of insulin: Secondary | ICD-10-CM

## 2021-10-15 DIAGNOSIS — I10 Essential (primary) hypertension: Secondary | ICD-10-CM | POA: Diagnosis not present

## 2021-10-15 DIAGNOSIS — Z87891 Personal history of nicotine dependence: Secondary | ICD-10-CM

## 2021-10-15 DIAGNOSIS — E10649 Type 1 diabetes mellitus with hypoglycemia without coma: Secondary | ICD-10-CM | POA: Diagnosis not present

## 2021-10-15 DIAGNOSIS — S199XXA Unspecified injury of neck, initial encounter: Secondary | ICD-10-CM | POA: Diagnosis not present

## 2021-10-15 DIAGNOSIS — R0789 Other chest pain: Secondary | ICD-10-CM | POA: Diagnosis present

## 2021-10-15 DIAGNOSIS — N186 End stage renal disease: Secondary | ICD-10-CM

## 2021-10-15 DIAGNOSIS — Z992 Dependence on renal dialysis: Secondary | ICD-10-CM | POA: Diagnosis not present

## 2021-10-15 DIAGNOSIS — G40909 Epilepsy, unspecified, not intractable, without status epilepticus: Secondary | ICD-10-CM

## 2021-10-15 DIAGNOSIS — N2581 Secondary hyperparathyroidism of renal origin: Secondary | ICD-10-CM | POA: Diagnosis present

## 2021-10-15 DIAGNOSIS — Z20822 Contact with and (suspected) exposure to covid-19: Secondary | ICD-10-CM | POA: Diagnosis not present

## 2021-10-15 DIAGNOSIS — E1165 Type 2 diabetes mellitus with hyperglycemia: Secondary | ICD-10-CM | POA: Diagnosis not present

## 2021-10-15 DIAGNOSIS — E10319 Type 1 diabetes mellitus with unspecified diabetic retinopathy without macular edema: Secondary | ICD-10-CM | POA: Diagnosis not present

## 2021-10-15 DIAGNOSIS — E870 Hyperosmolality and hypernatremia: Secondary | ICD-10-CM | POA: Diagnosis not present

## 2021-10-15 DIAGNOSIS — F32A Depression, unspecified: Secondary | ICD-10-CM | POA: Diagnosis not present

## 2021-10-15 DIAGNOSIS — E1022 Type 1 diabetes mellitus with diabetic chronic kidney disease: Secondary | ICD-10-CM | POA: Diagnosis present

## 2021-10-15 DIAGNOSIS — R739 Hyperglycemia, unspecified: Secondary | ICD-10-CM | POA: Diagnosis not present

## 2021-10-15 DIAGNOSIS — E039 Hypothyroidism, unspecified: Secondary | ICD-10-CM | POA: Diagnosis present

## 2021-10-15 DIAGNOSIS — Z888 Allergy status to other drugs, medicaments and biological substances status: Secondary | ICD-10-CM

## 2021-10-15 DIAGNOSIS — Z882 Allergy status to sulfonamides status: Secondary | ICD-10-CM

## 2021-10-15 DIAGNOSIS — J45909 Unspecified asthma, uncomplicated: Secondary | ICD-10-CM | POA: Diagnosis not present

## 2021-10-15 DIAGNOSIS — E782 Mixed hyperlipidemia: Secondary | ICD-10-CM | POA: Diagnosis present

## 2021-10-15 DIAGNOSIS — Z8249 Family history of ischemic heart disease and other diseases of the circulatory system: Secondary | ICD-10-CM

## 2021-10-15 DIAGNOSIS — I517 Cardiomegaly: Secondary | ICD-10-CM | POA: Diagnosis not present

## 2021-10-15 DIAGNOSIS — E1042 Type 1 diabetes mellitus with diabetic polyneuropathy: Secondary | ICD-10-CM | POA: Diagnosis present

## 2021-10-15 DIAGNOSIS — H35033 Hypertensive retinopathy, bilateral: Secondary | ICD-10-CM | POA: Diagnosis not present

## 2021-10-15 DIAGNOSIS — I160 Hypertensive urgency: Secondary | ICD-10-CM | POA: Diagnosis present

## 2021-10-15 DIAGNOSIS — Z820 Family history of epilepsy and other diseases of the nervous system: Secondary | ICD-10-CM

## 2021-10-15 DIAGNOSIS — Z83438 Family history of other disorder of lipoprotein metabolism and other lipidemia: Secondary | ICD-10-CM

## 2021-10-15 DIAGNOSIS — Z823 Family history of stroke: Secondary | ICD-10-CM

## 2021-10-15 DIAGNOSIS — D631 Anemia in chronic kidney disease: Secondary | ICD-10-CM | POA: Diagnosis present

## 2021-10-15 DIAGNOSIS — R404 Transient alteration of awareness: Secondary | ICD-10-CM | POA: Diagnosis not present

## 2021-10-15 DIAGNOSIS — Z79890 Hormone replacement therapy (postmenopausal): Secondary | ICD-10-CM

## 2021-10-15 DIAGNOSIS — Z803 Family history of malignant neoplasm of breast: Secondary | ICD-10-CM

## 2021-10-15 DIAGNOSIS — D509 Iron deficiency anemia, unspecified: Secondary | ICD-10-CM | POA: Diagnosis not present

## 2021-10-15 DIAGNOSIS — E785 Hyperlipidemia, unspecified: Secondary | ICD-10-CM | POA: Diagnosis not present

## 2021-10-15 DIAGNOSIS — Z23 Encounter for immunization: Secondary | ICD-10-CM | POA: Diagnosis not present

## 2021-10-15 DIAGNOSIS — Z91199 Patient's noncompliance with other medical treatment and regimen due to unspecified reason: Secondary | ICD-10-CM

## 2021-10-15 DIAGNOSIS — Z981 Arthrodesis status: Secondary | ICD-10-CM

## 2021-10-15 DIAGNOSIS — M79605 Pain in left leg: Secondary | ICD-10-CM | POA: Diagnosis not present

## 2021-10-15 DIAGNOSIS — Z833 Family history of diabetes mellitus: Secondary | ICD-10-CM

## 2021-10-15 LAB — COMPREHENSIVE METABOLIC PANEL
ALT: 15 U/L (ref 0–44)
AST: 31 U/L (ref 15–41)
Albumin: 3.7 g/dL (ref 3.5–5.0)
Alkaline Phosphatase: 141 U/L — ABNORMAL HIGH (ref 38–126)
Anion gap: 17 — ABNORMAL HIGH (ref 5–15)
BUN: 14 mg/dL (ref 6–20)
CO2: 20 mmol/L — ABNORMAL LOW (ref 22–32)
Calcium: 9 mg/dL (ref 8.9–10.3)
Chloride: 86 mmol/L — ABNORMAL LOW (ref 98–111)
Creatinine, Ser: 4.01 mg/dL — ABNORMAL HIGH (ref 0.44–1.00)
GFR, Estimated: 14 mL/min — ABNORMAL LOW (ref >60)
Glucose, Bld: 1017 mg/dL (ref 70–99)
Potassium: 4.2 mmol/L (ref 3.5–5.1)
Sodium: 123 mmol/L — ABNORMAL LOW (ref 135–145)
Total Bilirubin: 0.9 mg/dL (ref 0.3–1.2)
Total Protein: 6.9 g/dL (ref 6.5–8.1)

## 2021-10-15 LAB — CBC WITH DIFFERENTIAL/PLATELET
Abs Immature Granulocytes: 0.03 10*3/uL (ref 0.00–0.07)
Basophils Absolute: 0.1 10*3/uL (ref 0.0–0.1)
Basophils Relative: 1 %
Eosinophils Absolute: 0.1 10*3/uL (ref 0.0–0.5)
Eosinophils Relative: 2 %
HCT: 39.8 % (ref 36.0–46.0)
Hemoglobin: 11.7 g/dL — ABNORMAL LOW (ref 12.0–15.0)
Immature Granulocytes: 1 %
Lymphocytes Relative: 24 %
Lymphs Abs: 1.5 10*3/uL (ref 0.7–4.0)
MCH: 28.7 pg (ref 26.0–34.0)
MCHC: 29.4 g/dL — ABNORMAL LOW (ref 30.0–36.0)
MCV: 97.8 fL (ref 80.0–100.0)
Monocytes Absolute: 0.5 10*3/uL (ref 0.1–1.0)
Monocytes Relative: 8 %
Neutro Abs: 4.1 10*3/uL (ref 1.7–7.7)
Neutrophils Relative %: 64 %
Platelets: 118 10*3/uL — ABNORMAL LOW (ref 150–400)
RBC: 4.07 MIL/uL (ref 3.87–5.11)
RDW: 14.1 % (ref 11.5–15.5)
WBC: 6.3 10*3/uL (ref 4.0–10.5)
nRBC: 0 % (ref 0.0–0.2)

## 2021-10-15 LAB — I-STAT BETA HCG BLOOD, ED (MC, WL, AP ONLY): I-stat hCG, quantitative: <5 m[IU]/mL (ref <5)

## 2021-10-15 LAB — I-STAT VENOUS BLOOD GAS, ED
Acid-Base Excess: 2 mmol/L (ref 0.0–2.0)
Bicarbonate: 27.4 mmol/L (ref 20.0–28.0)
Calcium, Ion: 1.06 mmol/L — ABNORMAL LOW (ref 1.15–1.40)
HCT: 37 % (ref 36.0–46.0)
Hemoglobin: 12.6 g/dL (ref 12.0–15.0)
O2 Saturation: 94 %
Potassium: 4.7 mmol/L (ref 3.5–5.1)
Sodium: 124 mmol/L — ABNORMAL LOW (ref 135–145)
TCO2: 29 mmol/L (ref 22–32)
pCO2, Ven: 46 mmHg (ref 44.0–60.0)
pH, Ven: 7.382 (ref 7.250–7.430)
pO2, Ven: 73 mmHg — ABNORMAL HIGH (ref 32.0–45.0)

## 2021-10-15 LAB — RESP PANEL BY RT-PCR (FLU A&B, COVID) ARPGX2
Influenza A by PCR: NEGATIVE
Influenza B by PCR: NEGATIVE
SARS Coronavirus 2 by RT PCR: NEGATIVE

## 2021-10-15 LAB — CBG MONITORING, ED
Glucose-Capillary: 490 mg/dL — ABNORMAL HIGH (ref 70–99)
Glucose-Capillary: >600 mg/dL (ref 70–99)
Glucose-Capillary: >600 mg/dL (ref 70–99)
Glucose-Capillary: >600 mg/dL (ref 70–99)
Glucose-Capillary: >600 mg/dL (ref 70–99)
Glucose-Capillary: >600 mg/dL (ref 70–99)

## 2021-10-15 LAB — BETA-HYDROXYBUTYRIC ACID: Beta-Hydroxybutyric Acid: 1.6 mmol/L — ABNORMAL HIGH (ref 0.05–0.27)

## 2021-10-15 LAB — CK: Total CK: 117 U/L (ref 38–234)

## 2021-10-15 MED ORDER — LEVETIRACETAM IN NACL 1000 MG/100ML IV SOLN
1000.0000 mg | Freq: Once | INTRAVENOUS | Status: AC
  Administered 2021-10-15: 1000 mg via INTRAVENOUS
  Filled 2021-10-15: qty 100

## 2021-10-15 MED ORDER — ONDANSETRON HCL 4 MG/2ML IJ SOLN
4.0000 mg | Freq: Once | INTRAMUSCULAR | Status: AC
  Administered 2021-10-15: 4 mg via INTRAVENOUS
  Filled 2021-10-15: qty 2

## 2021-10-15 MED ORDER — INSULIN REGULAR(HUMAN) IN NACL 100-0.9 UT/100ML-% IV SOLN
INTRAVENOUS | Status: DC
  Administered 2021-10-15: 5 [IU]/h via INTRAVENOUS
  Filled 2021-10-15: qty 100

## 2021-10-15 MED ORDER — LORAZEPAM 2 MG/ML IJ SOLN
INTRAMUSCULAR | Status: AC
  Administered 2021-10-15: 1 mg via INTRAMUSCULAR
  Filled 2021-10-15: qty 1

## 2021-10-15 MED ORDER — DEXTROSE IN LACTATED RINGERS 5 % IV SOLN: INTRAVENOUS | Status: DC

## 2021-10-15 MED ORDER — HYDRALAZINE HCL 25 MG PO TABS: 100.0000 mg | ORAL_TABLET | Freq: Once | ORAL | Status: DC

## 2021-10-15 MED ORDER — SODIUM CHLORIDE 0.9 % IV BOLUS
500.0000 mL | Freq: Once | INTRAVENOUS | Status: AC
  Administered 2021-10-15: 500 mL via INTRAVENOUS

## 2021-10-15 MED ORDER — HEPARIN SODIUM (PORCINE) 5000 UNIT/ML IJ SOLN
5000.0000 [IU] | Freq: Three times a day (TID) | INTRAMUSCULAR | Status: DC
  Filled 2021-10-15 (×5): qty 1

## 2021-10-15 MED ORDER — LACTATED RINGERS IV SOLN: INTRAVENOUS | Status: DC

## 2021-10-15 MED ORDER — DEXTROSE 50 % IV SOLN
0.0000 mL | INTRAVENOUS | Status: DC | PRN
  Administered 2021-10-16 – 2021-10-17 (×2): 25 mL via INTRAVENOUS
  Filled 2021-10-15 (×2): qty 50

## 2021-10-15 MED ORDER — HYDRALAZINE HCL 20 MG/ML IJ SOLN
10.0000 mg | INTRAMUSCULAR | Status: DC | PRN
  Administered 2021-10-18: 10 mg via INTRAVENOUS
  Filled 2021-10-15: qty 1

## 2021-10-15 MED ORDER — LORAZEPAM 2 MG/ML IJ SOLN: 1.0000 mg | Freq: Once | INTRAMUSCULAR | Status: AC

## 2021-10-15 MED ORDER — POTASSIUM CHLORIDE 10 MEQ/100ML IV SOLN
10.0000 meq | INTRAVENOUS | Status: AC
  Filled 2021-10-15: qty 100

## 2021-10-15 MED ORDER — DEXTROSE 50 % IV SOLN: 0.0000 mL | INTRAVENOUS | Status: DC | PRN

## 2021-10-15 MED ORDER — INSULIN REGULAR(HUMAN) IN NACL 100-0.9 UT/100ML-% IV SOLN: INTRAVENOUS | Status: DC

## 2021-10-15 MED ORDER — POTASSIUM CHLORIDE 10 MEQ/100ML IV SOLN
10.0000 meq | INTRAVENOUS | Status: AC
  Administered 2021-10-15 (×2): 10 meq via INTRAVENOUS
  Filled 2021-10-15: qty 100

## 2021-10-15 NOTE — ED Notes (Signed)
Pt unable to ambulate to the RR, placed on a purewick. RN notified
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
## 2021-10-15 NOTE — ED Notes (Signed)
Pt called out to report cramping in her leg and that she feels like a sz was starting. Upon entering room, pt was screaming and crying. The pt laid back and began tensing extremities while still stating that a sz was starting. The pt proceeded to jerk body back and forth and was no longer responding to this RN's voice or other stimuli. Provider called to room. Verbal order for IM Ativan received. Ativan administered; see MAR for details.

## 2021-10-15 NOTE — ED Triage Notes (Signed)
Pt from home BIB EMS for sz like activity witnessed by family. Hx of sz r/t BGL issues; doesn't take sz meds. Hx of DM, HTN, Dialysis (MWF- full tx today). Pt had one episode of sz-like activity with EMS on arrival. Pt reports a cramp in right leg prior to onset of sz.  20 RAC 216/123 99% RA HR 88 BGL >600

## 2021-10-15 NOTE — ED Notes (Signed)
Please have patient call mother sandra [phone]
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
## 2021-10-16 ENCOUNTER — Inpatient Hospital Stay (HOSPITAL_COMMUNITY): Payer: Medicaid Other

## 2021-10-16 DIAGNOSIS — E1069 Type 1 diabetes mellitus with other specified complication: Secondary | ICD-10-CM | POA: Diagnosis not present

## 2021-10-16 DIAGNOSIS — R569 Unspecified convulsions: Secondary | ICD-10-CM | POA: Diagnosis not present

## 2021-10-16 DIAGNOSIS — E1065 Type 1 diabetes mellitus with hyperglycemia: Secondary | ICD-10-CM | POA: Diagnosis not present

## 2021-10-16 DIAGNOSIS — E870 Hyperosmolality and hypernatremia: Secondary | ICD-10-CM | POA: Diagnosis not present

## 2021-10-16 LAB — BASIC METABOLIC PANEL
Anion gap: 11 (ref 5–15)
Anion gap: 12 (ref 5–15)
Anion gap: 12 (ref 5–15)
BUN: 16 mg/dL (ref 6–20)
BUN: 16 mg/dL (ref 6–20)
BUN: 18 mg/dL (ref 6–20)
CO2: 22 mmol/L (ref 22–32)
CO2: 23 mmol/L (ref 22–32)
CO2: 25 mmol/L (ref 22–32)
Calcium: 9.1 mg/dL (ref 8.9–10.3)
Calcium: 9.4 mg/dL (ref 8.9–10.3)
Calcium: 9.8 mg/dL (ref 8.9–10.3)
Chloride: 96 mmol/L — ABNORMAL LOW (ref 98–111)
Chloride: 96 mmol/L — ABNORMAL LOW (ref 98–111)
Chloride: 98 mmol/L (ref 98–111)
Creatinine, Ser: 4.36 mg/dL — ABNORMAL HIGH (ref 0.44–1.00)
Creatinine, Ser: 5.03 mg/dL — ABNORMAL HIGH (ref 0.44–1.00)
Creatinine, Ser: 5.41 mg/dL — ABNORMAL HIGH (ref 0.44–1.00)
GFR, Estimated: 10 mL/min — ABNORMAL LOW (ref >60)
GFR, Estimated: 11 mL/min — ABNORMAL LOW (ref >60)
GFR, Estimated: 13 mL/min — ABNORMAL LOW (ref >60)
Glucose, Bld: 117 mg/dL — ABNORMAL HIGH (ref 70–99)
Glucose, Bld: 287 mg/dL — ABNORMAL HIGH (ref 70–99)
Glucose, Bld: 485 mg/dL — ABNORMAL HIGH (ref 70–99)
Potassium: 3.7 mmol/L (ref 3.5–5.1)
Potassium: 4.1 mmol/L (ref 3.5–5.1)
Potassium: 5.8 mmol/L — ABNORMAL HIGH (ref 3.5–5.1)
Sodium: 130 mmol/L — ABNORMAL LOW (ref 135–145)
Sodium: 131 mmol/L — ABNORMAL LOW (ref 135–145)
Sodium: 134 mmol/L — ABNORMAL LOW (ref 135–145)

## 2021-10-16 LAB — CBG MONITORING, ED
Glucose-Capillary: 158 mg/dL — ABNORMAL HIGH (ref 70–99)
Glucose-Capillary: 58 mg/dL — ABNORMAL LOW (ref 70–99)
Glucose-Capillary: 97 mg/dL (ref 70–99)
Glucose-Capillary: 111 mg/dL — ABNORMAL HIGH (ref 70–99)
Glucose-Capillary: 146 mg/dL — ABNORMAL HIGH (ref 70–99)
Glucose-Capillary: 253 mg/dL — ABNORMAL HIGH (ref 70–99)
Glucose-Capillary: 323 mg/dL — ABNORMAL HIGH (ref 70–99)

## 2021-10-16 LAB — GLUCOSE, CAPILLARY
Glucose-Capillary: 144 mg/dL — ABNORMAL HIGH (ref 70–99)
Glucose-Capillary: 119 mg/dL — ABNORMAL HIGH (ref 70–99)
Glucose-Capillary: 49 mg/dL — ABNORMAL LOW (ref 70–99)

## 2021-10-16 LAB — CBC
HCT: 34.1 % — ABNORMAL LOW (ref 36.0–46.0)
Hemoglobin: 10.6 g/dL — ABNORMAL LOW (ref 12.0–15.0)
MCH: 28.9 pg (ref 26.0–34.0)
MCHC: 31.1 g/dL (ref 30.0–36.0)
MCV: 92.9 fL (ref 80.0–100.0)
Platelets: 109 10*3/uL — ABNORMAL LOW (ref 150–400)
RBC: 3.67 MIL/uL — ABNORMAL LOW (ref 3.87–5.11)
RDW: 13.5 % (ref 11.5–15.5)
WBC: 6.8 10*3/uL (ref 4.0–10.5)
nRBC: 0 % (ref 0.0–0.2)

## 2021-10-16 LAB — HEMOGLOBIN A1C
Hgb A1c MFr Bld: 8.3 % — ABNORMAL HIGH (ref 4.8–5.6)
Mean Plasma Glucose: 191.51 mg/dL

## 2021-10-16 LAB — HIV ANTIBODY (ROUTINE TESTING W REFLEX): HIV Screen 4th Generation wRfx: NONREACTIVE

## 2021-10-16 LAB — TROPONIN I (HIGH SENSITIVITY)
Troponin I (High Sensitivity): 15 ng/L (ref <18)
Troponin I (High Sensitivity): 19 ng/L — ABNORMAL HIGH (ref <18)

## 2021-10-16 LAB — OSMOLALITY: Osmolality: 300 mOsm/kg — ABNORMAL HIGH (ref 275–295)

## 2021-10-16 LAB — BETA-HYDROXYBUTYRIC ACID: Beta-Hydroxybutyric Acid: 0.23 mmol/L (ref 0.05–0.27)

## 2021-10-16 MED ORDER — BUSPIRONE HCL 10 MG PO TABS
15.0000 mg | ORAL_TABLET | Freq: Every day | ORAL | Status: DC
  Administered 2021-10-16 – 2021-10-18 (×3): 15 mg via ORAL
  Filled 2021-10-16 (×3): qty 2

## 2021-10-16 MED ORDER — TOPIRAMATE 25 MG PO TABS: 25.0000 mg | ORAL_TABLET | Freq: Two times a day (BID) | ORAL | Status: DC

## 2021-10-16 MED ORDER — GABAPENTIN 300 MG PO CAPS
600.0000 mg | ORAL_CAPSULE | Freq: Two times a day (BID) | ORAL | Status: DC
  Administered 2021-10-16 – 2021-10-19 (×7): 600 mg via ORAL
  Filled 2021-10-16 (×7): qty 2

## 2021-10-16 MED ORDER — LEVOTHYROXINE SODIUM 75 MCG PO TABS
175.0000 ug | ORAL_TABLET | Freq: Every morning | ORAL | Status: DC
  Administered 2021-10-16 – 2021-10-19 (×4): 175 ug via ORAL
  Filled 2021-10-16 (×4): qty 1

## 2021-10-16 MED ORDER — ONDANSETRON HCL 4 MG/2ML IJ SOLN
4.0000 mg | Freq: Four times a day (QID) | INTRAMUSCULAR | Status: DC | PRN
  Administered 2021-10-16 – 2021-10-18 (×3): 4 mg via INTRAVENOUS
  Filled 2021-10-16 (×3): qty 2

## 2021-10-16 MED ORDER — HYDRALAZINE HCL 50 MG PO TABS
100.0000 mg | ORAL_TABLET | Freq: Three times a day (TID) | ORAL | Status: DC
  Administered 2021-10-16 – 2021-10-19 (×10): 100 mg via ORAL
  Filled 2021-10-16 (×9): qty 2
  Filled 2021-10-16: qty 4

## 2021-10-16 MED ORDER — LOSARTAN POTASSIUM 50 MG PO TABS
50.0000 mg | ORAL_TABLET | Freq: Every day | ORAL | Status: DC
  Administered 2021-10-16: 50 mg via ORAL
  Filled 2021-10-16: qty 1

## 2021-10-16 MED ORDER — INSULIN GLARGINE-YFGN 100 UNIT/ML ~~LOC~~ SOLN
20.0000 [IU] | Freq: Every day | SUBCUTANEOUS | Status: DC
  Administered 2021-10-16: 20 [IU] via SUBCUTANEOUS
  Filled 2021-10-16 (×2): qty 0.2

## 2021-10-16 MED ORDER — ALBUTEROL SULFATE (2.5 MG/3ML) 0.083% IN NEBU: 3.0000 mL | INHALATION_SOLUTION | Freq: Four times a day (QID) | RESPIRATORY_TRACT | Status: DC | PRN

## 2021-10-16 MED ORDER — PANTOPRAZOLE SODIUM 40 MG PO TBEC
40.0000 mg | DELAYED_RELEASE_TABLET | Freq: Every day | ORAL | Status: DC
  Administered 2021-10-16 – 2021-10-19 (×4): 40 mg via ORAL
  Filled 2021-10-16 (×4): qty 1

## 2021-10-16 MED ORDER — INSULIN ASPART 100 UNIT/ML IJ SOLN: 0.0000 [IU] | Freq: Every day | INTRAMUSCULAR | Status: DC

## 2021-10-16 MED ORDER — DEXTROSE 50 % IV SOLN
25.0000 mL | Freq: Once | INTRAVENOUS | Status: AC
  Administered 2021-10-16: 25 mL via INTRAVENOUS

## 2021-10-16 MED ORDER — AMLODIPINE BESYLATE 10 MG PO TABS
10.0000 mg | ORAL_TABLET | Freq: Every day | ORAL | Status: DC
  Administered 2021-10-16 – 2021-10-19 (×4): 10 mg via ORAL
  Filled 2021-10-16: qty 2
  Filled 2021-10-16 (×3): qty 1

## 2021-10-16 MED ORDER — PRAVASTATIN SODIUM 10 MG PO TABS
20.0000 mg | ORAL_TABLET | Freq: Every day | ORAL | Status: DC
  Administered 2021-10-16 – 2021-10-18 (×3): 20 mg via ORAL
  Filled 2021-10-16 (×3): qty 2

## 2021-10-16 MED ORDER — ESCITALOPRAM OXALATE 20 MG PO TABS
20.0000 mg | ORAL_TABLET | Freq: Every day | ORAL | Status: DC
  Administered 2021-10-16 – 2021-10-19 (×4): 20 mg via ORAL
  Filled 2021-10-16: qty 1
  Filled 2021-10-16: qty 2
  Filled 2021-10-16 (×2): qty 1

## 2021-10-16 MED ORDER — LEVETIRACETAM 250 MG PO TABS
250.0000 mg | ORAL_TABLET | ORAL | Status: DC
  Administered 2021-10-18: 250 mg via ORAL
  Filled 2021-10-16 (×2): qty 1

## 2021-10-16 MED ORDER — LEVETIRACETAM 500 MG PO TABS
500.0000 mg | ORAL_TABLET | Freq: Every day | ORAL | Status: DC
  Administered 2021-10-16 – 2021-10-19 (×4): 500 mg via ORAL
  Filled 2021-10-16 (×4): qty 1

## 2021-10-16 MED ORDER — INSULIN ASPART 100 UNIT/ML IJ SOLN
0.0000 [IU] | Freq: Three times a day (TID) | INTRAMUSCULAR | Status: DC
  Administered 2021-10-16: 7 [IU] via SUBCUTANEOUS
  Administered 2021-10-16: 1 [IU] via SUBCUTANEOUS

## 2021-10-16 NOTE — ED Notes (Addendum)
Pt given cup of ice. Okayed per RN, Donia Guiles.

## 2021-10-16 NOTE — Progress Notes (Signed)
EEG complete - results pending 

## 2021-10-16 NOTE — ED Notes (Signed)
This RN attempted to initiate sepsis protocol, but delayed d/t poor vasculature of patient. MD Rancor made aware about patient vasculature. Orders placed for IV team to see patient.

## 2021-10-16 NOTE — ED Notes (Signed)
Cbg clicked off for cbg taken PTA this am they were completed not by this RN

## 2021-10-16 NOTE — Significant Event (Signed)
Hypoglycemic Event  CBG:49   Treatment: D50 25 mL (12.5 gm)  Symptoms: None  Follow-up CBG: Time: 2221 CBG Result:119  Possible Reasons for Event: Vomiting and Inadequate meal intake  Comments/MD notified:hypoglycemic protocol followed     [place]

## 2021-10-16 NOTE — Procedures (Signed)
Routine EEG Report  Angela [name] is a 35 y.o. female with a history of seizures who is undergoing an EEG to evaluate for seizures.  Report: This EEG was acquired with electrodes placed according to the International 10-20 electrode system (including Fp1, Fp2, F3, F4, C3, C4, P3, P4, O1, O2, T3, T4, T5, T6, A1, A2, Fz, Cz, Pz). The following electrodes were missing or displaced: none.  The occipital dominant rhythm was 9 Hz with overriding beta frequencies. This activity is reactive to stimulation. Drowsiness was manifested by background fragmentation; deeper stages of sleep were identified by K complexes and sleep spindles. There was no focal slowing. There were no interictal epileptiform discharges. There were no electrographic seizures identified. Photic stimulation and hyperventilation were not performed.   Impression: This EEG was obtained while awake and asleep and is normal.    Clinical Correlation: Normal EEGs, however, do not rule out epilepsy.  Su Monks, MD [facility] [phone]  If 7pm- 7am, please page neurology on call as listed in [place].

## 2021-10-16 NOTE — ED Notes (Signed)
RN with treatment team

## 2021-10-16 NOTE — Progress Notes (Signed)
Inpatient Diabetes Program Recommendations  AACE/ADA: New Consensus Statement on Inpatient Glycemic Control (2015)  Target Ranges:  Prepandial:   less than 140 mg/dL      Peak postprandial:   less than 180 mg/dL (1-2 hours)      Critically ill patients:  140 - 180 mg/dL   Results for Angela [name], Angela [name] (MRN [id number]) as of 10/16/2021 13:02  Ref. Range 10/16/2021 01:48 10/16/2021 03:26 10/16/2021 04:15 10/16/2021 05:42 10/16/2021 06:54 10/16/2021 08:40 10/16/2021 11:21  Glucose-Capillary Latest Ref Range: 70 - 99 mg/dL 158 (H) 58 (L) 97 111 (H) 146 (H) 253 (H) 323 (H)  Results for Angela [name], Angela [name] (MRN [id number]) as of 10/16/2021 13:02  Ref. Range 10/15/2021 17:57 10/15/2021 19:49 10/15/2021 20:53 10/15/2021 21:40 10/15/2021 22:37 10/15/2021 23:38  Glucose-Capillary Latest Ref Range: 70 - 99 mg/dL >600 (HH) >600 (HH) >600 (HH) >600 (HH) >600 (HH) 490 (H)  Results for Angela [name], Angela [name] (MRN [id number]) as of 10/16/2021 13:02  Ref. Range 10/15/2021 17:21  CO2 Latest Ref Range: 22 - 32 mmol/L 20 (L)  Glucose Latest Ref Range: 70 - 99 mg/dL 1,017 (HH)  Anion gap Latest Ref Range: 5 - 15  17 (H)  Results for Angela [name], Angela [name] (MRN [id number]) as of 10/16/2021 13:02  Ref. Range 10/15/2021 19:05  Beta-Hydroxybutyric Acid Latest Ref Range: 0.05 - 0.27 mmol/L 1.60 (H)   Review of Glycemic Control  Diabetes history: DM1 Outpatient Diabetes medications: Insulin Pump with Novolog, Tresiba 12 units daily Current orders for Inpatient glycemic control: Semglee 20 units daily, Novolog 0-9 units TID with meals, Novolog 0-5 units QHS  Inpatient Diabetes Program Recommendations:    Insulin: Please decrease Novolog correction to 0-6 units TID. If patient has any further issues with hypoglycemia will likely need to decrease Semglee dose.  NOTE: Noted consult for Diabetes Coordinator. Diabetes Coordinator is not on campus over the weekend but available by pager from 8am to 5pm for questions or concerns. Chart  reviewed. Noted patient has hx of DM1 and ESRD w/HD. Noted office visit note with Jacolyn Reedy, FNP (Endocrinology with [place]) on 06/04/21 which notes patient was not currently using her insulin pump so prescribed Tresiba 12 units QHS, Novolog 1:16 grams (1 unit for every 16 grams of carbs) with breakfast and lunch, Novolog 1:14 grams with supper, Novolog 1-3 units for correction. Patient admitted with initial glucose of 1017 mg/dl on 10/15/21 and was initially ordered IV insulin. Glucose down to 58 mg/dl at 3:26 am today and insulin was stopped. Glucose began to rise after IV insulin was stopped and no SQ insulin given until 11:25 (received Semglee 20 units) and 11:26 (received Novolog 7 units). Patient is currently still in the Emergency Room. Have tried to call patient's cell number in the chart but goes straight to voicemail. Sent communication to [place], Therapist, sports to ask about talking with patient over the phone.  Thanks, Barnie Alderman, RN, MSN, CDE Diabetes Coordinator Inpatient Diabetes Program [phone] (Team Pager from 8am to 5pm)

## 2021-10-16 NOTE — Progress Notes (Signed)
PROGRESS NOTE    Angela [name]  VWU:[id number] DOB: 1986-04-27 DOA: 10/15/2021 PCP: [name], MD   Brief Narrative:  HPI: Angela [name] is a 35 y.o. female with history of ESRD on hemodialysis, hypertension, diabetes mellitus type 1, migraine was brought to the ER after patient had a seizure as witnessed by patient's mother.  Patient's mother who provided most of the history states that patient had her dialysis this morning and was at the patient's mother's house and was going to take her insulin when her right leg started shaking for which he fell onto the floor and her right upper extremity started shaking and became unresponsive.  The whole episode lasted for around 2 to 3 minutes.  Did not have any incontinence of urine or bowel or tongue bite.  EMS was called and patient was brought to the ER on the way to the ER patient also had another seizure-like episode as per the report.   When the patient became more alert after the seizure in the ER patient states she had 3 episodes the day before.  Also had another episode about a month ago.  Patient was told to go to a neurologist.   ED Course: In the ER patient had another episode of tonic-clonic activity as witnessed by the ER physician.  Lasted for around 2 minutes.  Following which patient appeared confused.  Patient was given loading dose of Keppra 1 g and Ativan.  CT head and C-spine were unremarkable.  Labs show markedly elevated blood sugar with readings around 1017.  Anion gap is 17.  Globin 11.7 platelets 118 patient was started on insulin infusion.  CT head C-spine were unremarkable chest x-ray unremarkable and COVID test was negative.  On-call neurologist was consulted.  Assessment & Plan:   Principal Problem:   Type 1 diabetes mellitus with hyperosmolar hyperglycemic state (HHS) ([place]) Active Problems:   Seizure ([place])   ESRD on dialysis [facility])   Essential hypertension  Hyperosmolar nonketotic state and type 1 diabetes  carcinoma: Patient significantly hyperglycemic upon arrival, blood sugar> 1000.  Blood sugar around 250 and for some reason, she is on dextrose.  We will stop dextrose.  Start her on Semglee 20 units and SSI.  Seizures -could have been precipitated by hyperosmolar status.  Received Keppra and has not had any further seizures.  EEG completed.  Defer to neurology.  Hypertensive urgency: Blood pressure significantly elevated.  She is on several home medications (Cozaar, hydralazine, amlodipine ) which have not been resumed.  We will resume all of them and monitor closely.  ESRD on hemodialysis: MWF schedule.  Received her dialysis on Friday.  No indication of dialysis now.  We will notify nephrology to schedule her for Monday.  Anemia of chronic disease: Hemoglobin is stable.  Hypothyroidism: Continue Synthroid.  Chest pain: Patient complains of chest pain, central and radiating to the back.  No shortness of breath, diaphoresis or nausea.  On examination, she has point tenderness.  Doubt cardiac but I think this is musculoskeletal pain.  We will get another EKG, check cardiac enzymes.  Monitor closely.  DVT prophylaxis: heparin injection 5,000 Units Start: 10/15/21 2245   Code Status: Full Code  Family Communication: Patient's mother present at bedside.  Plan of care discussed with patient in length and he verbalized understanding and agreed with it.  Status is: Inpatient  Remains inpatient appropriate because: Needs further observation and management of seizure  Estimated body mass index is 21.63 kg/m as calculated from  the following:   Height as of 10/05/21: 5' 4.25" (1.632 m).   Weight as of 10/05/21: 57.6 kg.   Nutritional Assessment: There is no height or weight on file to calculate BMI.. Seen by dietician.  I agree with the assessment and plan as outlined below: Nutrition Status:    Skin Assessment: I have examined the patient's skin and I agree with the wound assessment as  performed by the wound care RN as outlined below:    Consultants:  Neurology  Procedures:  None  Antimicrobials:  Anti-infectives (From admission, onward)    None          Subjective: Patient seen and examined in the ED.  Mother at the bedside.  Patient getting EEG.  She complains of central chest pain radiating to the back.  No other complaint associated with that.  Objective: Vitals:   10/16/21 1015 10/16/21 1045 10/16/21 1115 10/16/21 1130  BP: (!) 164/104 (!) 165/90 (!) 155/86 (!) 166/85  Pulse: 98 86 88 90  Resp: 16 13 18 18   Temp:      TempSrc:      SpO2: 96% 98% 97% 96%    Intake/Output Summary (Last 24 hours) at 10/16/2021 1151 Last data filed at 10/16/2021 0636 Gross per 24 hour  Intake 742.59 ml  Output --  Net 742.59 ml   There were no vitals filed for this visit.  Examination:  General exam: Appears calm and comfortable  Respiratory system: Clear to auscultation. Respiratory effort normal.  Point tenderness at the anterior lower chest. Cardiovascular system: S1 & S2 heard, RRR. No JVD, murmurs, rubs, gallops or clicks. No pedal edema. Gastrointestinal system: Abdomen is nondistended, soft and nontender. No organomegaly or masses felt. Normal bowel sounds heard. Central nervous system: Alert and oriented. No focal neurological deficits. Extremities: Symmetric 5 x 5 power. Skin: No rashes, lesions or ulcers Psychiatry: Judgement and insight appear normal. Mood & affect appropriate.    Data Reviewed: I have personally reviewed following labs and imaging studies  CBC: Recent Labs  Lab 10/15/21 1721 10/15/21 1913 10/15/21 2353  WBC 6.3  --  6.8  NEUTROABS 4.1  --   --   HGB 11.7* 12.6 10.6*  HCT 39.8 37.0 34.1*  MCV 97.8  --  92.9  PLT 118*  --  500*   Basic Metabolic Panel: Recent Labs  Lab 10/15/21 1721 10/15/21 1913 10/15/21 2353 10/16/21 0643  NA 123* 124* 130* 131*  K 4.2 4.7 4.1 5.8*  CL 86*  --  96* 96*  CO2 20*  --  22 23   GLUCOSE 1,017*  --  485* 287*  BUN 14  --  16 18  CREATININE 4.01*  --  4.36* 5.03*  CALCIUM 9.0  --  9.1 9.4   GFR: Estimated Creatinine Clearance: 13.5 mL/min (A) (by C-G formula based on SCr of 5.03 mg/dL (H)). Liver Function Tests: Recent Labs  Lab 10/15/21 1721  AST 31  ALT 15  ALKPHOS 141*  BILITOT 0.9  PROT 6.9  ALBUMIN 3.7   No results for input(s): LIPASE, AMYLASE in the last 168 hours. No results for input(s): AMMONIA in the last 168 hours. Coagulation Profile: No results for input(s): INR, PROTIME in the last 168 hours. Cardiac Enzymes: Recent Labs  Lab 10/15/21 1721  CKTOTAL 117   BNP (last 3 results) No results for input(s): PROBNP in the last 8760 hours. HbA1C: Recent Labs    10/15/21 2353  HGBA1C 8.3*   CBG: Recent  Labs  Lab 10/16/21 0415 10/16/21 0542 10/16/21 0654 10/16/21 0840 10/16/21 1121  GLUCAP 97 111* 146* 253* 323*   Lipid Profile: No results for input(s): CHOL, HDL, LDLCALC, TRIG, CHOLHDL, LDLDIRECT in the last 72 hours. Thyroid Function Tests: No results for input(s): TSH, T4TOTAL, FREET4, T3FREE, THYROIDAB in the last 72 hours. Anemia Panel: No results for input(s): VITAMINB12, FOLATE, FERRITIN, TIBC, IRON, RETICCTPCT in the last 72 hours. Sepsis Labs: No results for input(s): PROCALCITON, LATICACIDVEN in the last 168 hours.  Recent Results (from the past 240 hour(s))  Resp Panel by RT-PCR (Flu A&B, Covid) Nasopharyngeal Swab     Status: None   Collection Time: 10/15/21  8:11 PM   Specimen: Nasopharyngeal Swab; Nasopharyngeal(NP) swabs in vial transport medium  Result Value Ref Range Status   SARS Coronavirus 2 by RT PCR NEGATIVE NEGATIVE Final    Comment: (NOTE) SARS-CoV-2 target nucleic acids are NOT DETECTED.  The SARS-CoV-2 RNA is generally detectable in upper respiratory specimens during the acute phase of infection. The lowest concentration of SARS-CoV-2 viral copies this assay can detect is 138 copies/mL. A  negative result does not preclude SARS-Cov-2 infection and should not be used as the sole basis for treatment or other patient management decisions. A negative result may occur with  improper specimen collection/handling, submission of specimen other than nasopharyngeal swab, presence of viral mutation(s) within the areas targeted by this assay, and inadequate number of viral copies(<138 copies/mL). A negative result must be combined with clinical observations, patient history, and epidemiological information. The expected result is Negative.  Fact Sheet for Patients:  EntrepreneurPulse.com.au  Fact Sheet for Healthcare Providers:  IncredibleEmployment.be  This test is no t yet approved or cleared by the [place] FDA and  has been authorized for detection and/or diagnosis of SARS-CoV-2 by FDA under an Emergency Use Authorization (EUA). This EUA will remain  in effect (meaning this test can be used) for the duration of the COVID-19 declaration under Section 564(b)(1) of the Act, 21 U.S.C.section 360bbb-3(b)(1), unless the authorization is terminated  or revoked sooner.       Influenza A by PCR NEGATIVE NEGATIVE Final   Influenza B by PCR NEGATIVE NEGATIVE Final    Comment: (NOTE) The Xpert Xpress SARS-CoV-2/FLU/RSV plus assay is intended as an aid in the diagnosis of influenza from Nasopharyngeal swab specimens and should not be used as a sole basis for treatment. Nasal washings and aspirates are unacceptable for Xpert Xpress SARS-CoV-2/FLU/RSV testing.  Fact Sheet for Patients: EntrepreneurPulse.com.au  Fact Sheet for Healthcare Providers: IncredibleEmployment.be  This test is not yet approved or cleared by the [place] FDA and has been authorized for detection and/or diagnosis of SARS-CoV-2 by FDA under an Emergency Use Authorization (EUA). This EUA will remain in effect (meaning this test can  be used) for the duration of the COVID-19 declaration under Section 564(b)(1) of the Act, 21 U.S.C. section 360bbb-3(b)(1), unless the authorization is terminated or revoked.  Performed at [facility], [place] [address] [place], [place] [address]       Radiology Studies: CT HEAD WO CONTRAST (5MM)  Result Date: 10/15/2021 CLINICAL DATA:  Neck trauma. The injuries injury mechanism. Seizure, acute, history of trauma. EXAM: CT HEAD WITHOUT CONTRAST CT CERVICAL SPINE WITHOUT CONTRAST TECHNIQUE: Multidetector CT imaging of the head and cervical spine was performed following the standard protocol without intravenous contrast. Multiplanar CT image reconstructions of the cervical spine were also generated. COMPARISON:  MR head without and with contrast 06/06/2020.  CT head without contrast 08/16/2019 FINDINGS: CT HEAD FINDINGS Brain: No acute infarct, hemorrhage, or mass lesion is present. No significant white matter lesions are present. The ventricles are of normal size. No significant extraaxial fluid collection is present. The brainstem and cerebellum are within normal limits. Vascular: No hyperdense vessel or unexpected calcification. Skull: Calvarium is intact. No focal lytic or blastic lesions are present. No significant extracranial soft tissue lesion is present. Sinuses/Orbits: The paranasal sinuses and mastoid air cells are clear. Prominent polyp or mucous retention cyst is again noted in the right maxillary sinus. Mild mucosal thickening of the right maxillary sinus is also stable. The paranasal sinuses and mastoid air cells are otherwise clear. The globes and orbits are within normal limits. CT CERVICAL SPINE FINDINGS Alignment: No significant listhesis is present. Cervical lordosis is preserved. Skull base and vertebrae: No acute fracture. No primary bone lesion or focal pathologic process. Incomplete posterior spinal fusion evident at C7, a normal variant. Soft tissues and spinal canal: No  prevertebral fluid or swelling. No visible canal hematoma. Disc levels:  No significant focal stenosis is evident. Upper chest: Lung apices are clear. Thoracic inlet is within normal limits. IMPRESSION: 1. No acute trauma to the head or cervical spine. 2. Normal CT appearance of the brain. 3. Stable right maxillary sinus disease. Electronically Signed   By: San Morelle M.D.   On: 10/15/2021 19:11   CT Cervical Spine Wo Contrast  Result Date: 10/15/2021 CLINICAL DATA:  Neck trauma. The injuries injury mechanism. Seizure, acute, history of trauma. EXAM: CT HEAD WITHOUT CONTRAST CT CERVICAL SPINE WITHOUT CONTRAST TECHNIQUE: Multidetector CT imaging of the head and cervical spine was performed following the standard protocol without intravenous contrast. Multiplanar CT image reconstructions of the cervical spine were also generated. COMPARISON:  MR head without and with contrast 06/06/2020. CT head without contrast 08/16/2019 FINDINGS: CT HEAD FINDINGS Brain: No acute infarct, hemorrhage, or mass lesion is present. No significant white matter lesions are present. The ventricles are of normal size. No significant extraaxial fluid collection is present. The brainstem and cerebellum are within normal limits. Vascular: No hyperdense vessel or unexpected calcification. Skull: Calvarium is intact. No focal lytic or blastic lesions are present. No significant extracranial soft tissue lesion is present. Sinuses/Orbits: The paranasal sinuses and mastoid air cells are clear. Prominent polyp or mucous retention cyst is again noted in the right maxillary sinus. Mild mucosal thickening of the right maxillary sinus is also stable. The paranasal sinuses and mastoid air cells are otherwise clear. The globes and orbits are within normal limits. CT CERVICAL SPINE FINDINGS Alignment: No significant listhesis is present. Cervical lordosis is preserved. Skull base and vertebrae: No acute fracture. No primary bone lesion or  focal pathologic process. Incomplete posterior spinal fusion evident at C7, a normal variant. Soft tissues and spinal canal: No prevertebral fluid or swelling. No visible canal hematoma. Disc levels:  No significant focal stenosis is evident. Upper chest: Lung apices are clear. Thoracic inlet is within normal limits. IMPRESSION: 1. No acute trauma to the head or cervical spine. 2. Normal CT appearance of the brain. 3. Stable right maxillary sinus disease. Electronically Signed   By: San Morelle M.D.   On: 10/15/2021 19:11   MR BRAIN WO CONTRAST  Result Date: 10/16/2021 CLINICAL DATA:  Seizure with abnormal neuro exam EXAM: MRI HEAD WITHOUT CONTRAST TECHNIQUE: Multiplanar, multiecho pulse sequences of the brain and surrounding structures were obtained without intravenous contrast. COMPARISON:  Head CT from yesterday FINDINGS:  Brain: No acute infarction, hemorrhage, hydrocephalus, extra-axial collection or mass lesion. Vascular: Normal flow voids. Skull and upper cervical spine: Diffusely hypointense marrow signal in the cervical spine, without focal lesion, stable from 2021. Appearance is likely related to chart history of end-stage renal disease and iron deficiency. Sinuses/Orbits: Large retention cyst appearance in the right maxillary sinus. IMPRESSION: No acute finding or visible seizure focus. Electronically Signed   By: Jorje Guild M.D.   On: 10/16/2021 05:17   DG Chest Portable 1 View  Result Date: 10/15/2021 CLINICAL DATA:  Seizure EXAM: PORTABLE CHEST 1 VIEW COMPARISON:  10/05/2021, 06/19/2021 FINDINGS: Right-sided central venous catheter tip over the right atrium. Borderline to mild cardiomegaly. No focal opacity, pleural effusion, or pneumothorax IMPRESSION: No active disease. Electronically Signed   By: Donavan Foil M.D.   On: 10/15/2021 19:30    Scheduled Meds:  amLODipine  10 mg Oral Daily   busPIRone  15 mg Oral QHS   escitalopram  20 mg Oral Daily   gabapentin  600 mg Oral  BID   heparin  5,000 Units Subcutaneous Q8H   hydrALAZINE  100 mg Oral TID   insulin aspart  0-5 Units Subcutaneous QHS   insulin aspart  0-9 Units Subcutaneous TID WC   insulin glargine-yfgn  20 Units Subcutaneous Daily   [START ON 10/18/2021] levETIRAcetam  250 mg Oral Q M,W,F-1800   levETIRAcetam  500 mg Oral Daily   levothyroxine  175 mcg Oral q morning   losartan  50 mg Oral Daily   pantoprazole  40 mg Oral Daily   pravastatin  20 mg Oral QHS   Continuous Infusions:   LOS: 1 day   Time spent: 35 minutes   Darliss Cheney, MD [facility]  10/16/2021, 11:51 AM  Please page via [facility] and do not message via secure chat for anything urgent. Secure chat can be used for anything non urgent.  How to contact the [facility] Attending or Consulting provider [place] or covering provider during after hours [place], for this patient?  Check the care team in [facility] and look for a) attending/consulting [facility] provider listed and b) the [facility] team listed. Page or secure chat [phone]. Log into [email or web address] and use [place]'s universal password to access. If you do not have the password, please contact the hospital operator. Locate the [facility] provider you are looking for under [facility] and page to a number that you can be directly reached. If you still have difficulty reaching the provider, please page the [facility] (Director on Call) for the Hospitalists listed on amion for assistance.

## 2021-10-16 NOTE — ED Notes (Signed)
RN contacted by on-call MD Jaquita Rector concerning patient CBG status. Per Hal Hope, keep IV insulin off, but continue with hourly CBG checks. RN to continue to monitor.

## 2021-10-16 NOTE — Progress Notes (Signed)
Brief neuro update:  Reviewed MRI brain without contrast.  Routine EEG is still pending but irrespective of the EEG shows, would recommend continuing Keppra.  Will recommend continuing Keppra 500 mg daily along with an additional 250 mg on Monday, Wednesday and Friday after dialysis.  She should follow-up with neurology outpatient for further seizure management.  We will sign off at this time.  Please call us back if she has further seizures while she is inpatient.  Plan discussed with Dr. Doristine Bosworth with the hospitalist team.   Donnetta Simpers [facility] Pager Number [phone]
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
## 2021-10-17 DIAGNOSIS — R569 Unspecified convulsions: Secondary | ICD-10-CM | POA: Diagnosis not present

## 2021-10-17 DIAGNOSIS — Z992 Dependence on renal dialysis: Secondary | ICD-10-CM | POA: Diagnosis not present

## 2021-10-17 DIAGNOSIS — D631 Anemia in chronic kidney disease: Secondary | ICD-10-CM | POA: Diagnosis not present

## 2021-10-17 DIAGNOSIS — N186 End stage renal disease: Secondary | ICD-10-CM | POA: Diagnosis not present

## 2021-10-17 DIAGNOSIS — I120 Hypertensive chronic kidney disease with stage 5 chronic kidney disease or end stage renal disease: Secondary | ICD-10-CM | POA: Diagnosis not present

## 2021-10-17 DIAGNOSIS — E870 Hyperosmolality and hypernatremia: Secondary | ICD-10-CM | POA: Diagnosis not present

## 2021-10-17 DIAGNOSIS — E1069 Type 1 diabetes mellitus with other specified complication: Secondary | ICD-10-CM | POA: Diagnosis not present

## 2021-10-17 DIAGNOSIS — E162 Hypoglycemia, unspecified: Secondary | ICD-10-CM | POA: Diagnosis not present

## 2021-10-17 DIAGNOSIS — E1065 Type 1 diabetes mellitus with hyperglycemia: Secondary | ICD-10-CM | POA: Diagnosis not present

## 2021-10-17 LAB — BASIC METABOLIC PANEL
Anion gap: 11 (ref 5–15)
Anion gap: 11 (ref 5–15)
BUN: 17 mg/dL (ref 6–20)
BUN: 19 mg/dL (ref 6–20)
CO2: 25 mmol/L (ref 22–32)
CO2: 23 mmol/L (ref 22–32)
Calcium: 10.1 mg/dL (ref 8.9–10.3)
Calcium: 9.8 mg/dL (ref 8.9–10.3)
Chloride: 98 mmol/L (ref 98–111)
Chloride: 99 mmol/L (ref 98–111)
Creatinine, Ser: 6.32 mg/dL — ABNORMAL HIGH (ref 0.44–1.00)
Creatinine, Ser: 6.89 mg/dL — ABNORMAL HIGH (ref 0.44–1.00)
GFR, Estimated: 8 mL/min — ABNORMAL LOW (ref >60)
GFR, Estimated: 7 mL/min — ABNORMAL LOW (ref >60)
Glucose, Bld: 38 mg/dL — CL (ref 70–99)
Glucose, Bld: 111 mg/dL — ABNORMAL HIGH (ref 70–99)
Potassium: 3.8 mmol/L (ref 3.5–5.1)
Potassium: 4.2 mmol/L (ref 3.5–5.1)
Sodium: 134 mmol/L — ABNORMAL LOW (ref 135–145)
Sodium: 133 mmol/L — ABNORMAL LOW (ref 135–145)

## 2021-10-17 LAB — HEPATITIS B SURFACE ANTIGEN: Hepatitis B Surface Ag: NONREACTIVE

## 2021-10-17 LAB — GLUCOSE, CAPILLARY
Glucose-Capillary: 104 mg/dL — ABNORMAL HIGH (ref 70–99)
Glucose-Capillary: 105 mg/dL — ABNORMAL HIGH (ref 70–99)
Glucose-Capillary: 127 mg/dL — ABNORMAL HIGH (ref 70–99)
Glucose-Capillary: 135 mg/dL — ABNORMAL HIGH (ref 70–99)
Glucose-Capillary: 185 mg/dL — ABNORMAL HIGH (ref 70–99)
Glucose-Capillary: 40 mg/dL — CL (ref 70–99)
Glucose-Capillary: 70 mg/dL (ref 70–99)

## 2021-10-17 LAB — HEPATITIS B SURFACE ANTIBODY,QUALITATIVE: Hep B S Ab: REACTIVE — AB

## 2021-10-17 LAB — HEPATITIS B CORE ANTIBODY, TOTAL: Hep B Core Total Ab: NONREACTIVE

## 2021-10-17 MED ORDER — CHLORHEXIDINE GLUCONATE CLOTH 2 % EX PADS
6.0000 | MEDICATED_PAD | Freq: Every day | CUTANEOUS | Status: DC
  Administered 2021-10-18 – 2021-10-19 (×2): 6 via TOPICAL

## 2021-10-17 MED ORDER — INSULIN ASPART 100 UNIT/ML IJ SOLN
0.0000 [IU] | INTRAMUSCULAR | Status: DC
Start: 2021-10-17 — End: 2021-10-18
  Administered 2021-10-18 (×2): 2 [IU] via SUBCUTANEOUS

## 2021-10-17 NOTE — Progress Notes (Addendum)
Neurology Progress Note  S: pt c/o lethargy; when awakened is appropriate and follows all commands.     O: patient had seizure activity this am  corresponding to profound hypoglycemia.   Current vital signs: BP 140/90 (BP Location: Right Arm)   Pulse 92   Temp 98.2 F (36.8 C) (Oral)   Resp 17   Ht 5\' 4"  (1.626 m)   SpO2 95%   BMI 21.80 kg/m  Vital signs in last 24 hours: Temp:  [97.9 F (36.6 C)-99.7 F (37.6 C)] 98.2 F (36.8 C) (10/30 1123) Pulse Rate:  [86-92] 92 (10/30 1123) Resp:  [16-18] 17 (10/30 1123) BP: (127-180)/(76-102) 140/90 (10/30 1123) SpO2:  [95 %-99 %] 95 % (10/30 1123)  GENERAL:     35 year old black female appearing Awake, alert in NAD. HEENT: Normocephalic and atraumatic. LUNGS: Normal respiratory effort.  CV: RRR on tele.  Ext: warm.  NEURO:  Mental Status: Alert  oriented to person, place, and time. Speech/Language: speech is without aphasia or dysarthria.  Naming, repetition, fluency, and comprehension intact.  Cranial Nerves:  II: PERRL. Visual fields full.  III, IV, VI: EOMI. Eyelids elevate symmetrically.  V: Sensation is intact to light touch and symmetrical to face.  VII: Smile is symmetrical. Able to puff cheeks and raise eyebrows.  VIII: hearing intact to voice. IX, X: Palate elevates symmetrically. Phonation is normal.  UU:[id number] shrug 5/5. XII: tongue is midline without fasciculations.  Motor:  RUE: grip  5/5     bicep   5/5   tricep   5/5 RLE: thigh 5/5    knee     5/5    plantar flexion 5/5  dorsiflexion 5/5 LUE: grip   5/5   bicep     5/5   tricep   5/5 LLE: thigh  5/5    knee     5/5    plantar flexion 5/5  dorsiflexion 5/5  Tone: is normal and bulk is normal. Sensation- Intact to light touch bilaterally. Extinction absent to light touch to DSS.     Coordination: FTN intact bilaterally, HKS: no ataxia in BLE. No drift.  DTRs:  RUE:  brachioradialis 2/2      RLE:  patella   2/2   LUE:  brachioradialis  2/2 LLE:   patella    2/2   Gait- deferred.  Medications  Current Facility-Administered Medications:    albuterol (PROVENTIL) (2.5 MG/3ML) 0.083% nebulizer solution 3 mL, 3 mL, Inhalation, Q6H PRN, Doristine Bosworth, Ravi, MD   amLODipine (NORVASC) tablet 10 mg, 10 mg, Oral, Daily, Pahwani, Ravi, MD, 10 mg at 10/17/21 0840   busPIRone (BUSPAR) tablet 15 mg, 15 mg, Oral, QHS, Pahwani, Ravi, MD, 15 mg at 10/16/21 2342   [START ON 10/18/2021] Chlorhexidine Gluconate Cloth 2 % PADS 6 each, 6 each, Topical, Q0600, Justin Mend, MD   dextrose 50 % solution 0-50 mL, 0-50 mL, Intravenous, PRN, Rise Patience, MD, 25 mL at 10/17/21 0645   escitalopram (LEXAPRO) tablet 20 mg, 20 mg, Oral, Daily, Pahwani, Ravi, MD, 20 mg at 10/17/21 0840   gabapentin (NEURONTIN) capsule 600 mg, 600 mg, Oral, BID, Pahwani, Ravi, MD, 600 mg at 10/17/21 0840   heparin injection 5,000 Units, 5,000 Units, Subcutaneous, Q8H, Rise Patience, MD   hydrALAZINE (APRESOLINE) injection 10 mg, 10 mg, Intravenous, Q4H PRN, Rise Patience, MD   hydrALAZINE (APRESOLINE) tablet 100 mg, 100 mg, Oral, TID, Pahwani, Ravi, MD, 100 mg at 10/17/21 0840   insulin  aspart (novoLOG) injection 0-6 Units, 0-6 Units, Subcutaneous, Q4H, Pahwani, Ravi, MD   [START ON 10/18/2021] levETIRAcetam (KEPPRA) tablet 250 mg, 250 mg, Oral, Q M,W,F-1800, Kirkpatrick, Vida Roller, MD   levETIRAcetam (KEPPRA) tablet 500 mg, 500 mg, Oral, Daily, Greta Doom, MD, 500 mg at 10/17/21 0840   levothyroxine (SYNTHROID) tablet 175 mcg, 175 mcg, Oral, q morning, Pahwani, Ravi, MD, 175 mcg at 10/17/21 0608   ondansetron (ZOFRAN) injection 4 mg, 4 mg, Intravenous, Q6H PRN, Pahwani, Ravi, MD, 4 mg at 10/16/21 1608   pantoprazole (PROTONIX) EC tablet 40 mg, 40 mg, Oral, Daily, Pahwani, Ravi, MD, 40 mg at 10/17/21 0840   pravastatin (PRAVACHOL) tablet 20 mg, 20 mg, Oral, QHS, Pahwani, Einar Grad, MD, 20 mg at 10/16/21 2342  Pertinent Labs Results for ELISA, KUTNER (MRN  [id number]) as of 10/17/2021 15:32  Ref. Range 10/17/2021 05:17  Sodium Latest Ref Range: 135 - 145 mmol/L 134 (L)  Potassium Latest Ref Range: 3.5 - 5.1 mmol/L 3.8  Chloride Latest Ref Range: 98 - 111 mmol/L 98  CO2 Latest Ref Range: 22 - 32 mmol/L 25  Glucose Latest Ref Range: 70 - 99 mg/dL 38 (LL)  BUN Latest Ref Range: 6 - 20 mg/dL 17  Creatinine Latest Ref Range: 0.44 - 1.00 mg/dL 6.32 (H)  Calcium Latest Ref Range: 8.9 - 10.3 mg/dL 10.1  Anion gap Latest Ref Range: 5 - 15  11  GFR, Estimated Latest Ref Range: >60 mL/min 8 (L)    Imaging MD has reviewed images in epic and the results pertinent to this consultation are:  CT Head:  1. No acute trauma to the head or cervical spine. 2. Normal CT appearance of the brain. 3. Stable right maxillary sinus disease.    MRI Brain : No acute finding or visible seizure focus.  EEG: This EEG was obtained while awake and asleep and is normal.  Assessment: 35 year old black female with known seizure disorder who had a provoked seizure; patient seizure occurred in the setting of profound hypoglycemia. Since blood sugar has been normalized patient has been stable neurologically.   Impression: 35 year old female with Known seizure disorder.   Recommendations/Plan: Continue with keppra 500mg  daily with an additional 250mg  on Monday, Wednesday and Friday after dialysis. She should follow up with neurology outpatient for further seizure management. We will sign off at this time  but remain available to assist.

## 2021-10-17 NOTE — Consult Note (Signed)
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 Abnormal   Collection Time: 10/17/21  6:43 AM  Result Value Ref Range   Glucose-Capillary 40 (LL) 70 - 99 mg/dL    Comment: Glucose reference range applies only to samples taken after fasting for at least 8 hours.   Comment 1 Notify RN   Glucose, capillary     Status: Abnormal   Collection Time: 10/17/21  6:57 AM  Result Value Ref Range   Glucose-Capillary 105 (H) 70 - 99 mg/dL    Comment: Glucose reference range applies only to samples taken after fasting for at least 8 hours.  Glucose, capillary     Status: None   Collection Time: 10/17/21  7:55 AM  Result Value Ref Range   Glucose-Capillary 70 70 - 99 mg/dL    Comment: Glucose  reference range applies only to samples taken after fasting for at least 8 hours.  Glucose, capillary     Status: Abnormal   Collection Time: 10/17/21 11:50 AM  Result Value Ref Range   Glucose-Capillary 104 (H) 70 - 99 mg/dL    Comment: Glucose reference range applies only to samples taken after fasting for at least 8 hours.    CT HEAD WO CONTRAST (5MM)  Result Date: 10/15/2021 CLINICAL DATA:  Neck trauma. The injuries injury mechanism. Seizure, acute, history of trauma. EXAM: CT HEAD WITHOUT CONTRAST CT CERVICAL SPINE WITHOUT CONTRAST TECHNIQUE: Multidetector CT imaging of the head and cervical spine was performed following the standard protocol without intravenous contrast. Multiplanar CT image reconstructions of the cervical spine were also generated. COMPARISON:  MR head without and with contrast 06/06/2020. CT head without contrast 08/16/2019 FINDINGS: CT HEAD FINDINGS Brain: No acute infarct, hemorrhage, or mass lesion is present. No significant white matter lesions are present. The ventricles are of normal size. No significant extraaxial fluid collection is present. The brainstem and cerebellum are within normal limits. Vascular: No hyperdense vessel or unexpected calcification. Skull: Calvarium is intact. No focal lytic or blastic lesions are present. No significant extracranial soft tissue lesion is present. Sinuses/Orbits: The paranasal sinuses and mastoid air cells are clear. Prominent polyp or mucous retention cyst is again noted in the right maxillary sinus. Mild mucosal thickening of the right maxillary sinus is also stable. The paranasal sinuses and mastoid air cells are otherwise clear. The globes and orbits are within normal limits. CT CERVICAL SPINE FINDINGS Alignment: No significant listhesis is present. Cervical lordosis is preserved. Skull base and vertebrae: No acute fracture. No primary bone lesion or focal pathologic process. Incomplete posterior spinal fusion evident at C7, a  normal variant. Soft tissues and spinal canal: No prevertebral fluid or swelling. No visible canal hematoma. Disc levels:  No significant focal stenosis is evident. Upper chest: Lung apices are clear. Thoracic inlet is within normal limits. IMPRESSION: 1. No acute trauma to the head or cervical spine. 2. Normal CT appearance of the brain. 3. Stable right maxillary sinus disease. Electronically Signed   By: San Morelle M.D.   On: 10/15/2021 19:11   CT Cervical Spine Wo Contrast  Result Date: 10/15/2021 CLINICAL DATA:  Neck trauma. The injuries injury mechanism. Seizure, acute, history of trauma. EXAM: CT HEAD WITHOUT CONTRAST CT CERVICAL SPINE WITHOUT CONTRAST TECHNIQUE: Multidetector CT imaging of the head and cervical spine was performed following the standard protocol without intravenous contrast. Multiplanar CT image reconstructions of the cervical spine were also generated. COMPARISON:  MR head without and with contrast 06/06/2020. CT head without contrast 08/16/2019 FINDINGS: CT HEAD FINDINGS Brain: No acute infarct, hemorrhage, or mass lesion is present. No significant white matter lesions are present. The ventricles are of normal size. No significant extraaxial fluid collection is present. The brainstem and cerebellum are within normal limits. Vascular: No hyperdense vessel or unexpected calcification. Skull: Calvarium is intact. No focal lytic or blastic lesions are present. No significant extracranial soft tissue lesion is present. Sinuses/Orbits: The paranasal sinuses and mastoid air cells are clear. Prominent polyp or mucous retention cyst is again noted in the right maxillary sinus. Mild mucosal thickening of the right maxillary sinus is also stable. The paranasal sinuses and mastoid air cells are otherwise clear. The globes and orbits are within normal limits. CT CERVICAL SPINE FINDINGS Alignment: No significant listhesis is present. Cervical lordosis is preserved. Skull base and  vertebrae: No acute fracture. No primary bone lesion or focal pathologic process. Incomplete posterior spinal fusion evident at C7, a normal variant. Soft tissues and spinal canal: No prevertebral fluid or swelling. No visible canal hematoma. Disc levels:  No significant focal stenosis is evident. Upper chest: Lung apices are clear. Thoracic inlet is within normal limits. IMPRESSION: 1. No acute trauma to the head or cervical spine. 2. Normal CT appearance of the brain. 3. Stable right maxillary sinus disease. Electronically Signed   By: San Morelle M.D.   On: 10/15/2021 19:11   MR BRAIN WO CONTRAST  Result Date: 10/16/2021 CLINICAL DATA:  Seizure with abnormal neuro exam EXAM: MRI HEAD WITHOUT CONTRAST TECHNIQUE: Multiplanar, multiecho pulse sequences of the brain and surrounding structures were obtained without intravenous contrast. COMPARISON:  Head CT from yesterday FINDINGS: Brain: No acute infarction, hemorrhage, hydrocephalus, extra-axial collection or mass lesion. Vascular: Normal flow voids. Skull and upper cervical spine: Diffusely hypointense marrow signal in the cervical spine, without focal lesion, stable from 2021. Appearance is likely related to chart history of end-stage renal disease and iron deficiency. Sinuses/Orbits: Large retention cyst appearance in the right maxillary sinus. IMPRESSION: No acute finding or visible seizure focus. Electronically Signed   By: Jorje Guild M.D.   On: 10/16/2021 05:17   DG Chest Portable 1 View  Result Date: 10/15/2021 CLINICAL DATA:  Seizure EXAM: PORTABLE CHEST 1 VIEW COMPARISON:  10/05/2021, 06/19/2021 FINDINGS: Right-sided central venous catheter tip over the right atrium. Borderline to mild cardiomegaly. No focal opacity, pleural effusion, or pneumothorax IMPRESSION: No active disease. Electronically Signed   By: Donavan Foil M.D.   On: 10/15/2021 19:30   EEG adult  Result Date: 10/16/2021 Derek Jack, MD     10/16/2021  5:18  PM Routine  EEG Report Angela [name] is a 35 y.o. female with a history of seizures who is undergoing an EEG to evaluate for seizures. Report: This EEG was acquired with electrodes placed according to the International 10-20 electrode system (including Fp1, Fp2, F3, F4, C3, C4, P3, P4, O1, O2, T3, T4, T5, T6, A1, A2, Fz, Cz, Pz). The following electrodes were missing or displaced: none. The occipital dominant rhythm was 9 Hz with overriding beta frequencies. This activity is reactive to stimulation. Drowsiness was manifested by background fragmentation; deeper stages of sleep were identified by K complexes and sleep spindles. There was no focal slowing. There were no interictal epileptiform discharges. There were no electrographic seizures identified. Photic stimulation and hyperventilation were not performed. Impression: This EEG was obtained while awake and asleep and is normal.   Clinical Correlation: Normal EEGs, however, do not rule out epilepsy. Su Monks, MD [facility] [phone] If 7pm- 7am, please page neurology on call as listed in [place].    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  3.5h treatment.  Will do here tomorrow.    **HTN: fair control on her usual meds here - last BP 140.  UF to EDW tomorrow.  **Anemia: Hb 10.6.  **BMM:  does not appear to require phos binder. Can obtain outpt records for any VDRA, etc if she remains admitted.  **DM type 1: per primary. A1c 8.3.  Justin Mend 10/17/2021, 1:14 PM

## 2021-10-17 NOTE — Progress Notes (Addendum)
Patient with multiple hypoglycemic levels overnight. CBG checked for am level and result was 70. Orange juice brought to patient (and fully consumed) and breakfast tray given. No symptoms of hypoglycemia.  8:08 AM  Notified by lab of a critical blood glucose of 38. Night shift nurse had already addressed low CBG with IV D50. Will continue to access CBGs as ordered

## 2021-10-17 NOTE — Significant Event (Signed)
Hypoglycemic Event   Pt ask for her CBG to be taken as she felt her blood sugar was low. She describe feeling very tired and have a hard time " stop talking".   CBG: 40   Treatment: D50 25 mL (12.5 gm)  Symptoms: None  Follow-up CBG: Time: 0656 CBG Result: 105   Possible Reasons for Event: Unknown  Comments/MD notified:hypoglycemic protocol followed  Pt instructed to order breakfast now and encouraged to eat.    [place]

## 2021-10-17 NOTE — Progress Notes (Addendum)
Pt fiancee and pt reported having a " seizure" around 0550 lasting 1 minute. Pt and family described right leg spasms that travel through her thigh up to her heart, jaw clinched tight. Pt was able to describe event as very painful and uncontrolled. Family voived pt had a blank stare during this event.

## 2021-10-17 NOTE — Progress Notes (Signed)
Inpatient Diabetes Program Recommendations  AACE/ADA: New Consensus Statement on Inpatient Glycemic Control   Target Ranges:  Prepandial:   less than 140 mg/dL      Peak postprandial:   less than 180 mg/dL (1-2 hours)      Critically ill patients:  140 - 180 mg/dL   Results for Angela [name], Angela [name] (MRN [id number]) as of 10/17/2021 08:40  Ref. Range 10/16/2021 06:54 10/16/2021 08:40 10/16/2021 11:21 10/16/2021 15:54 10/16/2021 22:04 10/16/2021 22:19 10/17/2021 06:43 10/17/2021 06:57 10/17/2021 07:55  Glucose-Capillary Latest Ref Range: 70 - 99 mg/dL 146 (H) 253 (H) 323 (H) 144 (H) 49 (L) 119 (H) 40 (LL) 105 (H) 70    Review of Glycemic Control  Diabetes history: DM1 Outpatient Diabetes medications: Insulin Pump with Novolog, Tresiba 12 units daily Current orders for Inpatient glycemic control: Novolog 0-9 units TID with meals, Novolog 0-5 units QHS   Inpatient Diabetes Program Recommendations:     Insulin: Please decrease Novolog correction to 0-6 units and change to Q4H (since no basal insulin ordered and so that CBGs can be monitored more frequently and Novolog correction can be given more frequently if needed).  Patient has Type 1 DM so will need basal insulin but at a much lower dose.   NOTE: Patient received Semglee 20 units at 11:25 on 10/16/21 and has experienced several hypoglycemic episodes since. Semglee was discontinued. Patient has Type 1 DM so anticipate she will need to have basal insulin ordered but at a much lower dose once hypoglycemia completely resolves.    Thanks, Barnie Alderman, RN, MSN, CDE Diabetes Coordinator Inpatient Diabetes Program [phone] (Team Pager from 8am to 5pm)

## 2021-10-17 NOTE — Progress Notes (Signed)
PROGRESS NOTE    Angela [name]  ZYS:[id number] DOB: Feb 23, 1986 DOA: 10/15/2021 PCP: [name], MD   Brief Narrative:  HPI: Angela [name] is a 35 y.o. female with history of ESRD on hemodialysis, hypertension, diabetes mellitus type 1, migraine was brought to the ER after patient had a seizure as witnessed by patient's mother.  Patient's mother who provided most of the history states that patient had her dialysis this morning and was at the patient's mother's house and was going to take her insulin when her right leg started shaking for which he fell onto the floor and her right upper extremity started shaking and became unresponsive.  The whole episode lasted for around 2 to 3 minutes.  Did not have any incontinence of urine or bowel or tongue bite.  EMS was called and patient was brought to the ER on the way to the ER patient also had another seizure-like episode as per the report.   When the patient became more alert after the seizure in the ER patient states she had 3 episodes the day before.  Also had another episode about a month ago.  Patient was told to go to a neurologist.   ED Course: In the ER patient had another episode of tonic-clonic activity as witnessed by the ER physician.  Lasted for around 2 minutes.  Following which patient appeared confused.  Patient was given loading dose of Keppra 1 g and Ativan.  CT head and C-spine were unremarkable.  Labs show markedly elevated blood sugar with readings around 1017.  Anion gap is 17.  Globin 11.7 platelets 118 patient was started on insulin infusion.  CT head C-spine were unremarkable chest x-ray unremarkable and COVID test was negative.  On-call neurologist was consulted.  Assessment & Plan:   Principal Problem:   Type 1 diabetes mellitus with hyperosmolar hyperglycemic state (HHS) ([place]) Active Problems:   Seizure ([place])   ESRD on dialysis [facility])   Essential hypertension  Hyperosmolar nonketotic state and type 1 diabetes  melitis: Patient significantly hyperglycemic upon arrival, blood sugar> 1000.  She needed to be started on insulin drip.  Blood sugar improved, she was transition to long-acting Semglee 20 units with SSI.  Since yesterday, she has had several episodes of hypoglycemia.  We will discontinue Semglee.  Continue SSI only.  If persistent hypoglycemia, will need to be started on dextrose.  Seizures -could have been precipitated by hyperosmolar status.  Received Keppra, seen by neurology.  According to the mother who is at the bedside, patient had an episode of seizure-like activity this morning around 5 AM when she had blank staring along with movement of her left extremity.  This lasted for few minutes.  Reportedly, her blood sugar was checked 10 minutes later and was 46.  Likely her seizure-like activity was secondary to hypoglycemia however I have reconsulted neurology today.  EEG unremarkable.  Hypertensive urgency: Blood pressure significantly elevated upon arriva but now very well controlled on her home medications (Cozaar, hydralazine, amlodipine )  ESRD on hemodialysis: MWF schedule.  Received her dialysis on Friday.  I was hoping that he will be discharged today and thus nephrology not consulted.  I have consulted nephrology today for dialysis tomorrow.  Anemia of chronic disease: Hemoglobin is stable.  Hypothyroidism: Continue Synthroid.  Chest pain: She had no more chest pain today.  Cardiac enzymes flat and normal.  No EKG changes of ST-T wave.  DVT prophylaxis: heparin injection 5,000 Units Start: 10/15/21 2245   Code  Status: Full Code  Family Communication: Patient's mother present at bedside.  Plan of care discussed with patient in length and he verbalized understanding and agreed with it.  Status is: Inpatient  Remains inpatient appropriate because: Needs further observation and management of seizure  Estimated body mass index is 21.8 kg/m as calculated from the following:   Height  as of this encounter: 5\' 4"  (1.626 m).   Weight as of 10/05/21: 57.6 kg.   Nutritional Assessment: Body mass index is 21.8 kg/m.[name] Seen by dietician.  I agree with the assessment and plan as outlined below: Nutrition Status:    Skin Assessment: I have examined the patient's skin and I agree with the wound assessment as performed by the wound care RN as outlined below:    Consultants:  Neurology  Procedures:  None  Antimicrobials:  Anti-infectives (From admission, onward)    None          Subjective: Patient seen and examined.  She was sleepy.  Woke up easily.  No complaints.  Mother at the bedside. Objective: Vitals:   10/16/21 2340 10/17/21 0000 10/17/21 0402 10/17/21 0800  BP: (!) 180/98 138/88  (!) 158/99  Pulse:  91 92 86  Resp: 18   16  Temp: 99.7 F (37.6 C)  99.6 F (37.6 C) 97.9 F (36.6 C)  TempSrc: Oral  Oral Axillary  SpO2: 99% 98%  99%  Height:        Intake/Output Summary (Last 24 hours) at 10/17/2021 1123 Last data filed at 10/17/2021 0800 Gross per 24 hour  Intake 240 ml  Output --  Net 240 ml    There were no vitals filed for this visit.  Examination:  General exam: Appears calm and comfortable  Respiratory system: Clear to auscultation. Respiratory effort normal. Cardiovascular system: S1 & S2 heard, RRR. No JVD, murmurs, rubs, gallops or clicks. No pedal edema. Gastrointestinal system: Abdomen is nondistended, soft and nontender. No organomegaly or masses felt. Normal bowel sounds heard. Central nervous system: Alert and oriented. No focal neurological deficits. Extremities: Symmetric 5 x 5 power. Skin: No rashes, lesions or ulcers.  Psychiatry: Judgement and insight appear poor  Data Reviewed: I have personally reviewed following labs and imaging studies  CBC: Recent Labs  Lab 10/15/21 1721 10/15/21 1913 10/15/21 2353  WBC 6.3  --  6.8  NEUTROABS 4.1  --   --   HGB 11.7* 12.6 10.6*  HCT 39.8 37.0 34.1*  MCV 97.8  --   92.9  PLT 118*  --  109*    Basic Metabolic Panel: Recent Labs  Lab 10/15/21 1721 10/15/21 1913 10/15/21 2353 10/16/21 0643 10/16/21 1616 10/17/21 0517  NA 123* 124* 130* 131* 134* 134*  K 4.2 4.7 4.1 5.8* 3.7 3.8  CL 86*  --  96* 96* 98 98  CO2 20*  --  22 23 25 25   GLUCOSE 1,017*  --  485* 287* 117* 38*  BUN 14  --  16 18 16 17   CREATININE 4.01*  --  4.36* 5.03* 5.41* 6.32*  CALCIUM 9.0  --  9.1 9.4 9.8 10.1    GFR: Estimated Creatinine Clearance: 10.6 mL/min (A) (by C-G formula based on SCr of 6.32 mg/dL (H)). Liver Function Tests: Recent Labs  Lab 10/15/21 1721  AST 31  ALT 15  ALKPHOS 141*  BILITOT 0.9  PROT 6.9  ALBUMIN 3.7    No results for input(s): LIPASE, AMYLASE in the last 168 hours. No results for input(s): AMMONIA  in the last 168 hours. Coagulation Profile: No results for input(s): INR, PROTIME in the last 168 hours. Cardiac Enzymes: Recent Labs  Lab 10/15/21 1721  CKTOTAL 117    BNP (last 3 results) No results for input(s): PROBNP in the last 8760 hours. HbA1C: Recent Labs    10/15/21 2353  HGBA1C 8.3*    CBG: Recent Labs  Lab 10/16/21 2204 10/16/21 2219 10/17/21 0643 10/17/21 0657 10/17/21 0755  GLUCAP 49* 119* 40* 105* 70    Lipid Profile: No results for input(s): CHOL, HDL, LDLCALC, TRIG, CHOLHDL, LDLDIRECT in the last 72 hours. Thyroid Function Tests: No results for input(s): TSH, T4TOTAL, FREET4, T3FREE, THYROIDAB in the last 72 hours. Anemia Panel: No results for input(s): VITAMINB12, FOLATE, FERRITIN, TIBC, IRON, RETICCTPCT in the last 72 hours. Sepsis Labs: No results for input(s): PROCALCITON, LATICACIDVEN in the last 168 hours.  Recent Results (from the past 240 hour(s))  Resp Panel by RT-PCR (Flu A&B, Covid) Nasopharyngeal Swab     Status: None   Collection Time: 10/15/21  8:11 PM   Specimen: Nasopharyngeal Swab; Nasopharyngeal(NP) swabs in vial transport medium  Result Value Ref Range Status   SARS  Coronavirus 2 by RT PCR NEGATIVE NEGATIVE Final    Comment: (NOTE) SARS-CoV-2 target nucleic acids are NOT DETECTED.  The SARS-CoV-2 RNA is generally detectable in upper respiratory specimens during the acute phase of infection. The lowest concentration of SARS-CoV-2 viral copies this assay can detect is 138 copies/mL. A negative result does not preclude SARS-Cov-2 infection and should not be used as the sole basis for treatment or other patient management decisions. A negative result may occur with  improper specimen collection/handling, submission of specimen other than nasopharyngeal swab, presence of viral mutation(s) within the areas targeted by this assay, and inadequate number of viral copies(<138 copies/mL). A negative result must be combined with clinical observations, patient history, and epidemiological information. The expected result is Negative.  Fact Sheet for Patients:  EntrepreneurPulse.com.au  Fact Sheet for Healthcare Providers:  IncredibleEmployment.be  This test is no t yet approved or cleared by the [place] FDA and  has been authorized for detection and/or diagnosis of SARS-CoV-2 by FDA under an Emergency Use Authorization (EUA). This EUA will remain  in effect (meaning this test can be used) for the duration of the COVID-19 declaration under Section 564(b)(1) of the Act, 21 U.S.C.section 360bbb-3(b)(1), unless the authorization is terminated  or revoked sooner.       Influenza A by PCR NEGATIVE NEGATIVE Final   Influenza B by PCR NEGATIVE NEGATIVE Final    Comment: (NOTE) The Xpert Xpress SARS-CoV-2/FLU/RSV plus assay is intended as an aid in the diagnosis of influenza from Nasopharyngeal swab specimens and should not be used as a sole basis for treatment. Nasal washings and aspirates are unacceptable for Xpert Xpress SARS-CoV-2/FLU/RSV testing.  Fact Sheet for  Patients: EntrepreneurPulse.com.au  Fact Sheet for Healthcare Providers: IncredibleEmployment.be  This test is not yet approved or cleared by the [place] FDA and has been authorized for detection and/or diagnosis of SARS-CoV-2 by FDA under an Emergency Use Authorization (EUA). This EUA will remain in effect (meaning this test can be used) for the duration of the COVID-19 declaration under Section 564(b)(1) of the Act, 21 U.S.C. section 360bbb-3(b)(1), unless the authorization is terminated or revoked.  Performed at [facility], [place] [address] [place], [place] [address]        Radiology Studies: CT HEAD WO CONTRAST (5MM)  Result Date:  10/15/2021 CLINICAL DATA:  Neck trauma. The injuries injury mechanism. Seizure, acute, history of trauma. EXAM: CT HEAD WITHOUT CONTRAST CT CERVICAL SPINE WITHOUT CONTRAST TECHNIQUE: Multidetector CT imaging of the head and cervical spine was performed following the standard protocol without intravenous contrast. Multiplanar CT image reconstructions of the cervical spine were also generated. COMPARISON:  MR head without and with contrast 06/06/2020. CT head without contrast 08/16/2019 FINDINGS: CT HEAD FINDINGS Brain: No acute infarct, hemorrhage, or mass lesion is present. No significant white matter lesions are present. The ventricles are of normal size. No significant extraaxial fluid collection is present. The brainstem and cerebellum are within normal limits. Vascular: No hyperdense vessel or unexpected calcification. Skull: Calvarium is intact. No focal lytic or blastic lesions are present. No significant extracranial soft tissue lesion is present. Sinuses/Orbits: The paranasal sinuses and mastoid air cells are clear. Prominent polyp or mucous retention cyst is again noted in the right maxillary sinus. Mild mucosal thickening of the right maxillary sinus is also stable. The paranasal sinuses and mastoid air  cells are otherwise clear. The globes and orbits are within normal limits. CT CERVICAL SPINE FINDINGS Alignment: No significant listhesis is present. Cervical lordosis is preserved. Skull base and vertebrae: No acute fracture. No primary bone lesion or focal pathologic process. Incomplete posterior spinal fusion evident at C7, a normal variant. Soft tissues and spinal canal: No prevertebral fluid or swelling. No visible canal hematoma. Disc levels:  No significant focal stenosis is evident. Upper chest: Lung apices are clear. Thoracic inlet is within normal limits. IMPRESSION: 1. No acute trauma to the head or cervical spine. 2. Normal CT appearance of the brain. 3. Stable right maxillary sinus disease. Electronically Signed   By: San Morelle M.D.   On: 10/15/2021 19:11   CT Cervical Spine Wo Contrast  Result Date: 10/15/2021 CLINICAL DATA:  Neck trauma. The injuries injury mechanism. Seizure, acute, history of trauma. EXAM: CT HEAD WITHOUT CONTRAST CT CERVICAL SPINE WITHOUT CONTRAST TECHNIQUE: Multidetector CT imaging of the head and cervical spine was performed following the standard protocol without intravenous contrast. Multiplanar CT image reconstructions of the cervical spine were also generated. COMPARISON:  MR head without and with contrast 06/06/2020. CT head without contrast 08/16/2019 FINDINGS: CT HEAD FINDINGS Brain: No acute infarct, hemorrhage, or mass lesion is present. No significant white matter lesions are present. The ventricles are of normal size. No significant extraaxial fluid collection is present. The brainstem and cerebellum are within normal limits. Vascular: No hyperdense vessel or unexpected calcification. Skull: Calvarium is intact. No focal lytic or blastic lesions are present. No significant extracranial soft tissue lesion is present. Sinuses/Orbits: The paranasal sinuses and mastoid air cells are clear. Prominent polyp or mucous retention cyst is again noted in the right  maxillary sinus. Mild mucosal thickening of the right maxillary sinus is also stable. The paranasal sinuses and mastoid air cells are otherwise clear. The globes and orbits are within normal limits. CT CERVICAL SPINE FINDINGS Alignment: No significant listhesis is present. Cervical lordosis is preserved. Skull base and vertebrae: No acute fracture. No primary bone lesion or focal pathologic process. Incomplete posterior spinal fusion evident at C7, a normal variant. Soft tissues and spinal canal: No prevertebral fluid or swelling. No visible canal hematoma. Disc levels:  No significant focal stenosis is evident. Upper chest: Lung apices are clear. Thoracic inlet is within normal limits. IMPRESSION: 1. No acute trauma to the head or cervical spine. 2. Normal CT appearance of the brain. 3. Stable  right maxillary sinus disease. Electronically Signed   By: San Morelle M.D.   On: 10/15/2021 19:11   MR BRAIN WO CONTRAST  Result Date: 10/16/2021 CLINICAL DATA:  Seizure with abnormal neuro exam EXAM: MRI HEAD WITHOUT CONTRAST TECHNIQUE: Multiplanar, multiecho pulse sequences of the brain and surrounding structures were obtained without intravenous contrast. COMPARISON:  Head CT from yesterday FINDINGS: Brain: No acute infarction, hemorrhage, hydrocephalus, extra-axial collection or mass lesion. Vascular: Normal flow voids. Skull and upper cervical spine: Diffusely hypointense marrow signal in the cervical spine, without focal lesion, stable from 2021. Appearance is likely related to chart history of end-stage renal disease and iron deficiency. Sinuses/Orbits: Large retention cyst appearance in the right maxillary sinus. IMPRESSION: No acute finding or visible seizure focus. Electronically Signed   By: Jorje Guild M.D.   On: 10/16/2021 05:17   DG Chest Portable 1 View  Result Date: 10/15/2021 CLINICAL DATA:  Seizure EXAM: PORTABLE CHEST 1 VIEW COMPARISON:  10/05/2021, 06/19/2021 FINDINGS: Right-sided  central venous catheter tip over the right atrium. Borderline to mild cardiomegaly. No focal opacity, pleural effusion, or pneumothorax IMPRESSION: No active disease. Electronically Signed   By: Donavan Foil M.D.   On: 10/15/2021 19:30   EEG adult  Result Date: 10/16/2021 Derek Jack, MD     10/16/2021  5:18 PM Routine EEG Report Angela [name] is a 35 y.o. female with a history of seizures who is undergoing an EEG to evaluate for seizures. Report: This EEG was acquired with electrodes placed according to the International 10-20 electrode system (including Fp1, Fp2, F3, F4, C3, C4, P3, P4, O1, O2, T3, T4, T5, T6, A1, A2, Fz, Cz, Pz). The following electrodes were missing or displaced: none. The occipital dominant rhythm was 9 Hz with overriding beta frequencies. This activity is reactive to stimulation. Drowsiness was manifested by background fragmentation; deeper stages of sleep were identified by K complexes and sleep spindles. There was no focal slowing. There were no interictal epileptiform discharges. There were no electrographic seizures identified. Photic stimulation and hyperventilation were not performed. Impression: This EEG was obtained while awake and asleep and is normal.   Clinical Correlation: Normal EEGs, however, do not rule out epilepsy. Su Monks, MD [facility] [phone] If 7pm- 7am, please page neurology on call as listed in [place].    Scheduled Meds:  amLODipine  10 mg Oral Daily   busPIRone  15 mg Oral QHS   escitalopram  20 mg Oral Daily   gabapentin  600 mg Oral BID   heparin  5,000 Units Subcutaneous Q8H   hydrALAZINE  100 mg Oral TID   insulin aspart  0-6 Units Subcutaneous Q4H   [START ON 10/18/2021] levETIRAcetam  250 mg Oral Q M,W,F-1800   levETIRAcetam  500 mg Oral Daily   levothyroxine  175 mcg Oral q morning   pantoprazole  40 mg Oral Daily   pravastatin  20 mg Oral QHS   Continuous Infusions:   LOS: 2 days   Time spent: 30  minutes   Darliss Cheney, MD [facility]  10/17/2021, 11:23 AM  Please page via [place] [place] and do not message via secure chat for anything urgent. Secure chat can be used for anything non urgent.  How to contact the [facility] Attending or Consulting provider [place] or covering provider during after hours [place], for this patient?  Check the care team in [facility] and look for a) attending/consulting [facility] provider listed and b) the [facility] team listed. Page or  secure chat [phone]-7P. Log into [email or web address] and use [place]'s universal password to access. If you do not have the password, please contact the hospital operator. Locate the [facility] provider you are looking for under [facility] and page to a number that you can be directly reached. If you still have difficulty reaching the provider, please page the [facility] (Director on Call) for the Hospitalists listed on amion for assistance.

## 2021-10-18 DIAGNOSIS — Z992 Dependence on renal dialysis: Secondary | ICD-10-CM | POA: Diagnosis not present

## 2021-10-18 DIAGNOSIS — N186 End stage renal disease: Secondary | ICD-10-CM | POA: Diagnosis not present

## 2021-10-18 DIAGNOSIS — I120 Hypertensive chronic kidney disease with stage 5 chronic kidney disease or end stage renal disease: Secondary | ICD-10-CM | POA: Diagnosis not present

## 2021-10-18 DIAGNOSIS — E1069 Type 1 diabetes mellitus with other specified complication: Secondary | ICD-10-CM | POA: Diagnosis not present

## 2021-10-18 DIAGNOSIS — E162 Hypoglycemia, unspecified: Secondary | ICD-10-CM | POA: Diagnosis not present

## 2021-10-18 DIAGNOSIS — E870 Hyperosmolality and hypernatremia: Secondary | ICD-10-CM | POA: Diagnosis not present

## 2021-10-18 DIAGNOSIS — E1065 Type 1 diabetes mellitus with hyperglycemia: Secondary | ICD-10-CM | POA: Diagnosis not present

## 2021-10-18 DIAGNOSIS — D631 Anemia in chronic kidney disease: Secondary | ICD-10-CM | POA: Diagnosis not present

## 2021-10-18 DIAGNOSIS — R569 Unspecified convulsions: Secondary | ICD-10-CM | POA: Diagnosis not present

## 2021-10-18 LAB — BASIC METABOLIC PANEL
Anion gap: 8 (ref 5–15)
Anion gap: 8 (ref 5–15)
BUN: 21 mg/dL — ABNORMAL HIGH (ref 6–20)
BUN: 24 mg/dL — ABNORMAL HIGH (ref 6–20)
CO2: 24 mmol/L (ref 22–32)
CO2: 23 mmol/L (ref 22–32)
Calcium: 9.3 mg/dL (ref 8.9–10.3)
Calcium: 9.2 mg/dL (ref 8.9–10.3)
Chloride: 96 mmol/L — ABNORMAL LOW (ref 98–111)
Chloride: 96 mmol/L — ABNORMAL LOW (ref 98–111)
Creatinine, Ser: 7.65 mg/dL — ABNORMAL HIGH (ref 0.44–1.00)
Creatinine, Ser: 8.44 mg/dL — ABNORMAL HIGH (ref 0.44–1.00)
GFR, Estimated: 7 mL/min — ABNORMAL LOW (ref >60)
GFR, Estimated: 6 mL/min — ABNORMAL LOW (ref >60)
Glucose, Bld: 221 mg/dL — ABNORMAL HIGH (ref 70–99)
Glucose, Bld: 271 mg/dL — ABNORMAL HIGH (ref 70–99)
Potassium: 3.9 mmol/L (ref 3.5–5.1)
Potassium: 4.5 mmol/L (ref 3.5–5.1)
Sodium: 128 mmol/L — ABNORMAL LOW (ref 135–145)
Sodium: 127 mmol/L — ABNORMAL LOW (ref 135–145)

## 2021-10-18 LAB — CBC WITH DIFFERENTIAL/PLATELET
Abs Immature Granulocytes: 0.03 10*3/uL (ref 0.00–0.07)
Basophils Absolute: 0 10*3/uL (ref 0.0–0.1)
Basophils Relative: 1 %
Eosinophils Absolute: 0.2 10*3/uL (ref 0.0–0.5)
Eosinophils Relative: 3 %
HCT: 37 % (ref 36.0–46.0)
Hemoglobin: 11.4 g/dL — ABNORMAL LOW (ref 12.0–15.0)
Immature Granulocytes: 1 %
Lymphocytes Relative: 26 %
Lymphs Abs: 1.6 10*3/uL (ref 0.7–4.0)
MCH: 28.9 pg (ref 26.0–34.0)
MCHC: 30.8 g/dL (ref 30.0–36.0)
MCV: 93.9 fL (ref 80.0–100.0)
Monocytes Absolute: 0.4 10*3/uL (ref 0.1–1.0)
Monocytes Relative: 7 %
Neutro Abs: 3.9 10*3/uL (ref 1.7–7.7)
Neutrophils Relative %: 62 %
Platelets: 124 10*3/uL — ABNORMAL LOW (ref 150–400)
RBC: 3.94 MIL/uL (ref 3.87–5.11)
RDW: 13.7 % (ref 11.5–15.5)
WBC: 6.3 10*3/uL (ref 4.0–10.5)
nRBC: 0 % (ref 0.0–0.2)

## 2021-10-18 LAB — GLUCOSE, CAPILLARY
Glucose-Capillary: 250 mg/dL — ABNORMAL HIGH (ref 70–99)
Glucose-Capillary: 132 mg/dL — ABNORMAL HIGH (ref 70–99)
Glucose-Capillary: 217 mg/dL — ABNORMAL HIGH (ref 70–99)
Glucose-Capillary: 274 mg/dL — ABNORMAL HIGH (ref 70–99)
Glucose-Capillary: 83 mg/dL (ref 70–99)

## 2021-10-18 MED ORDER — INSULIN GLARGINE-YFGN 100 UNIT/ML ~~LOC~~ SOLN
5.0000 [IU] | Freq: Every day | SUBCUTANEOUS | Status: DC
  Administered 2021-10-18: 5 [IU] via SUBCUTANEOUS
  Filled 2021-10-18 (×2): qty 0.05

## 2021-10-18 MED ORDER — INSULIN ASPART 100 UNIT/ML IJ SOLN: 0.0000 [IU] | Freq: Every day | INTRAMUSCULAR | Status: DC

## 2021-10-18 MED ORDER — ALUM & MAG HYDROXIDE-SIMETH 200-200-20 MG/5ML PO SUSP: 30.0000 mL | ORAL | Status: DC | PRN

## 2021-10-18 MED ORDER — PANTOPRAZOLE SODIUM 40 MG PO TBEC
40.0000 mg | DELAYED_RELEASE_TABLET | Freq: Once | ORAL | Status: AC
  Administered 2021-10-18: 40 mg via ORAL

## 2021-10-18 MED ORDER — ACETAMINOPHEN 325 MG PO TABS
650.0000 mg | ORAL_TABLET | Freq: Four times a day (QID) | ORAL | Status: DC | PRN
  Administered 2021-10-18: 650 mg via ORAL
  Filled 2021-10-18: qty 2

## 2021-10-18 MED ORDER — INSULIN ASPART 100 UNIT/ML IJ SOLN
0.0000 [IU] | Freq: Three times a day (TID) | INTRAMUSCULAR | Status: DC
  Administered 2021-10-18: 3 [IU] via SUBCUTANEOUS

## 2021-10-18 MED ORDER — INSULIN ASPART 100 UNIT/ML IJ SOLN
2.0000 [IU] | Freq: Three times a day (TID) | INTRAMUSCULAR | Status: DC
  Administered 2021-10-18: 2 [IU] via SUBCUTANEOUS
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
## 2021-10-18 NOTE — Progress Notes (Signed)
Inpatient Diabetes Program Recommendations  AACE/ADA: New Consensus Statement on Inpatient Glycemic Control (2015)  Target Ranges:  Prepandial:   less than 140 mg/dL      Peak postprandial:   less than 180 mg/dL (1-2 hours)      Critically ill patients:  140 - 180 mg/dL   Lab Results  Component Value Date   GLUCAP 132 (H) 10/18/2021   HGBA1C 8.3 (H) 10/15/2021    Review of Glycemic Control Results for Angela [name], BRAGGS (MRN [id number]) as of 10/18/2021 10:48  Ref. Range 10/17/2021 15:50 10/17/2021 20:28 10/17/2021 23:52 10/18/2021 04:12 10/18/2021 08:06  Glucose-Capillary Latest Ref Range: 70 - 99 mg/dL 135 (H) 127 (H) 185 (H) 250 (H) 132 (H)  Diabetes history: DM1 Outpatient Diabetes medications: Insulin Pump with Novolog, Tresiba 12 units daily Current orders for Inpatient glycemic control: Novolog 0-6 units q 4 hours Inpatient Diabetes Program Recommendations:    Note patient had hypoglycemic event in the hospital.  She came in with Hyperglycemia with glucose>1000 mg/dL.   At home she takes [place] 20 units daily and Novolog SSI (she states no more than 8-10 units daily).  She rarely has low blood sugars but states she did have low at Dialysis last Monday.  Blood sugars day before coming to hospital were okay 190's.  She wears a Dexcom sensor and partner confirms that she rarely has lows.  Currently sensor is off due to having to be removed for MRI.  She will need basal insulin due to history of Type 1 DM.  She has used T-slim pump in the past and states she just hasn't worn it recently. I recommended that she f/u with her endocrinologist after d/c and possibly try to restart insulin pump due to it's ability regulate basal insulin dose based on Dexcom blood sugar data.   -Consider restarting low dose basal insulin such as Semglee 5 units daily?  -Consider changing Novolog correction to tid with meals and bedtime scale. (Still needs 2 AM blood sugar check).   -Add Novolog meal coverage  2 units tid with meals (hold if patient eats less than 50% or NPO).    Patient states that she does not think low blood sugars are causing her  seizures.  She states that her right leg has excruciating pain before the episodes.  Encouraged her to wear Dexcom sensor b/c it will give alarms for hypoglycemia and also give MD's important data for adjustments of insulins/etc.    Thanks,  Adah Perl, RN, BC-ADM Inpatient Diabetes Coordinator Pager [phone]  (8a-5p)

## 2021-10-18 NOTE — Progress Notes (Signed)
Nephrology Follow-Up Consult note   Assessment/Recommendations: Angela [name] is a/an 35 y.o. female with a past medical history significant for DM1 and ESRD, admitted for seizures.     **Seizure DO: has been started on keppra - dosed for ESRD with supplement post HD. Mgmt per neurology.   **ESRD on HD: MWF at [facility].  3.5h treatment.  HD today; hopefully DC soon after   **HTN: BP higher today. Cont home meds. UF on HD.   **Anemia: Hb 11.4. No treatment needed   **BMM:  no meds needed for now. Obtain records if she remains inpatient   **DM type 1: per primary. A1c 8.3.   Recommendations conveyed to primary service.    [facility] 10/18/2021 11:49 AM  ___________________________________________________________  CC: ESRD, seizures  Interval History/Subjective: Feeling okay today.  Questions regarding care and want to make sure that she is going to be okay from a seizure standpoint.  Plan for dialysis today   Medications:  Current Facility-Administered Medications  Medication Dose Route Frequency Provider Last Rate Last Admin   acetaminophen (TYLENOL) tablet 650 mg  650 mg Oral Q6H PRN Darliss Cheney, MD   650 mg at 10/18/21 0959   albuterol (PROVENTIL) (2.5 MG/3ML) 0.083% nebulizer solution 3 mL  3 mL Inhalation Q6H PRN Darliss Cheney, MD       amLODipine (NORVASC) tablet 10 mg  10 mg Oral Daily Pahwani, Einar Grad, MD   10 mg at 10/18/21 0959   busPIRone (BUSPAR) tablet 15 mg  15 mg Oral QHS Pahwani, Einar Grad, MD   15 mg at 10/17/21 2130   Chlorhexidine Gluconate Cloth 2 % PADS 6 each  6 each Topical Q0600 Justin Mend, MD   6 each at 10/18/21 0426   dextrose 50 % solution 0-50 mL  0-50 mL Intravenous PRN Rise Patience, MD   25 mL at 10/17/21 0645   escitalopram (LEXAPRO) tablet 20 mg  20 mg Oral Daily Pahwani, Einar Grad, MD   20 mg at 10/18/21 0959   gabapentin (NEURONTIN) capsule 600 mg  600 mg Oral BID Darliss Cheney, MD   600 mg at  10/18/21 0959   heparin injection 5,000 Units  5,000 Units Subcutaneous Q8H Rise Patience, MD       hydrALAZINE (APRESOLINE) injection 10 mg  10 mg Intravenous Q4H PRN Rise Patience, MD       hydrALAZINE (APRESOLINE) tablet 100 mg  100 mg Oral TID Darliss Cheney, MD   100 mg at 10/18/21 0959   insulin aspart (novoLOG) injection 0-6 Units  0-6 Units Subcutaneous Q4H Darliss Cheney, MD   2 Units at 10/18/21 0425   levETIRAcetam (KEPPRA) tablet 250 mg  250 mg Oral Q M,W,F-1800 Greta Doom, MD       levETIRAcetam (KEPPRA) tablet 500 mg  500 mg Oral Daily Greta Doom, MD   500 mg at 10/18/21 5053   levothyroxine (SYNTHROID) tablet 175 mcg  175 mcg Oral q morning Darliss Cheney, MD   175 mcg at 10/18/21 0628   ondansetron (ZOFRAN) injection 4 mg  4 mg Intravenous Q6H PRN Darliss Cheney, MD   4 mg at 10/18/21 0041   pantoprazole (PROTONIX) EC tablet 40 mg  40 mg Oral Daily Darliss Cheney, MD   40 mg at 10/18/21 0959   pravastatin (PRAVACHOL) tablet 20 mg  20 mg Oral QHS Darliss Cheney, MD   20 mg at 10/17/21 2130      Review of Systems: 10  systems reviewed and negative except per interval history/subjective  Physical Exam: Vitals:   10/18/21 0414 10/18/21 0809  BP: (!) 179/88 (!) 162/98  Pulse: 90 86  Resp:  14  Temp: 98.1 F (36.7 C) 98.1 F (36.7 C)  SpO2:  100%   No intake/output data recorded.  Intake/Output Summary (Last 24 hours) at 10/18/2021 1149 Last data filed at 10/17/2021 1600 Gross per 24 hour  Intake 24375 ml  Output --  Net 24375 ml   Constitutional: well-appearing, no acute distress ENMT: ears and nose without scars or lesions, MMM CV: normal rate, no edema Respiratory: Bilateral chest rise, normal work of breathing Gastrointestinal: soft, non-tender, no palpable masses or hernias Skin: no visible lesions or rashes Psych: alert, judgement/insight appropriate, appropriate mood and affect   Test Results I personally reviewed new and  old clinical labs and radiology tests Lab Results  Component Value Date   NA 128 (L) 10/18/2021   K 3.9 10/18/2021   CL 96 (L) 10/18/2021   CO2 24 10/18/2021   BUN 21 (H) 10/18/2021   CREATININE 7.65 (H) 10/18/2021   CALCIUM 9.3 10/18/2021   ALBUMIN 3.7 10/15/2021   PHOS 2.8 12/05/2019

## 2021-10-18 NOTE — Progress Notes (Signed)
PROGRESS NOTE    Angela [name]  ZOX:[id number] DOB: 1986-09-15 DOA: 10/15/2021 PCP: [name], MD   Brief Narrative:  Angela [name] is a 35 y.o. female with history of ESRD on hemodialysis, hypertension, diabetes mellitus type 1, migraine was brought to the ER after patient had a seizure as witnessed by patient's mother. patient had her dialysis on Friday and was at the patient's mother's house and was going to take her insulin when her right leg started shaking for which she fell onto the floor and her right upper extremity started shaking and became unresponsive.  The whole episode lasted for around 2 to 3 minutes.  Did not have any incontinence of urine or bowel or tongue bite.  EMS was called and patient was brought to the ER on the way to the ER patient also had another seizure-like episode as per the report.   When the patient became more alert after the seizure in the ER patient states she had 3 episodes the day before.  Also had another episode about a month ago.  Patient was told to go to a neurologist.   In the ER patient had another episode of tonic-clonic activity as witnessed by the ER physician.  Lasted for around 2 minutes.  This time she bit her tongue as well.  Following which patient appeared confused.  Patient was given loading dose of Keppra 1 g and Ativan.  CT head and C-spine were unremarkable.  Labs show markedly elevated blood sugar with readings around 1017.  Anion gap is 17.  CT head C-spine were unremarkable chest x-ray unremarkable and COVID test was negative.  On-call neurologist was consulted.  Assessment & Plan:   Principal Problem:   Type 1 diabetes mellitus with hyperosmolar hyperglycemic state (HHS) ([place]) Active Problems:   Seizure ([place])   ESRD on dialysis [facility])   Essential hypertension  Hyperosmolar nonketotic state and type 1 diabetes melitis: Patient significantly hyperglycemic upon arrival, blood sugar> 1000.  She needed to be started on  insulin drip.  Blood sugar improved, she was transition to long-acting Semglee 20 units with SSI.  She then had significant hypoglycemic episodes which were recurrent, long-acting was discontinued.  She was started on SSI.  She was seen by diabetes educator, per their recommendations, she has been started on NovoLog 3 times daily Premeal regimen and 5 units of cynically.  Along with SSI.  Her blood sugar is labile but she has not had any hypoglycemic episode since yesterday.  Seizures -could have been precipitated by hyperosmolar status.  Received Keppra, seen by neurology.  Patient had another episode of seizure at 5 AM on 10/17/2021 but at that time, her blood sugar was 46.  She has not had any seizures since yesterday but patient is not convinced that hypoglycemia is causing the seizure.  EEG unremarkable.  Neurology signed off and recommended continuing current dose of Keppra.  Patient requests further monitoring overnight before discharging tomorrow if she has no further seizure episodes.  Hypertensive urgency: Blood pressure significantly elevated upon arrival which improved but again elevated this morning.  Continue current medications which is amlodipine, hydralazine.  ESRD on hemodialysis: MWF schedule.  Scheduled for dialysis today.  Nephrology on board.  Anemia of chronic disease: Hemoglobin is stable.  Hypothyroidism: Continue Synthroid.  Chest pain: She had no more chest pain today.  Cardiac enzymes flat and normal.  No EKG changes of ST-T wave.  Right leg pain: Patient claims that she has lymphadenopathy in the inguinal  area bilaterally and she has right lower extremity pain and sometimes tremors.  On exam, there is minimal lymphadenopathy but her pain is unexplainable.  She looks comfortable.  DVT prophylaxis: heparin injection 5,000 Units Start: 10/15/21 2245   Code Status: Full Code  Family Communication: Patient's mother present at bedside.  Plan of care discussed with patient in  length and he verbalized understanding and agreed with it.  Status is: Inpatient  Remains inpatient appropriate because: Needs further observation and management of seizure  Estimated body mass index is 21.8 kg/m as calculated from the following:   Height as of this encounter: 5\' 4"  (1.626 m).   Weight as of 10/05/21: 57.6 kg.   Nutritional Assessment: Body mass index is 21.8 kg/m.[name] Seen by dietician.  I agree with the assessment and plan as outlined below: Nutrition Status:    Skin Assessment: I have examined the patient's skin and I agree with the wound assessment as performed by the wound care RN as outlined below:    Consultants:  Neurology  Procedures:  None  Antimicrobials:  Anti-infectives (From admission, onward)    None          Subjective: Patient seen and examined.  2 family members present at the bedside.  Patient fully alert and oriented.  She has complaint of right lower extremity pain and believes it is secondary to lymphadenopathy.  Requesting another day of monitoring.  Objective: Vitals:   10/17/21 2353 10/18/21 0414 10/18/21 0809 10/18/21 1200  BP: 139/74 (!) 179/88 (!) 162/98 (!) 190/98  Pulse:  90 86 94  Resp:   14 10  Temp: 98.8 F (37.1 C) 98.1 F (36.7 C) 98.1 F (36.7 C) 98.1 F (36.7 C)  TempSrc: Oral Oral Oral Oral  SpO2:   100% 100%  Height:        Intake/Output Summary (Last 24 hours) at 10/18/2021 1302 Last data filed at 10/17/2021 1600 Gross per 24 hour  Intake 300 ml  Output --  Net 300 ml    There were no vitals filed for this visit.  Examination:  General exam: Appears calm and comfortable  Respiratory system: Clear to auscultation. Respiratory effort normal. Cardiovascular system: S1 & S2 heard, RRR. No JVD, murmurs, rubs, gallops or clicks. No pedal edema. Gastrointestinal system: Abdomen is nondistended, soft and nontender. No organomegaly or masses felt. Normal bowel sounds heard. Central nervous system:  Alert and oriented. No focal neurological deficits. Extremities: Symmetric 5 x 5 power. Skin: No rashes, lesions or ulcers.  Psychiatry: Judgement and insight appear normal. Mood & affect appropriate.   Data Reviewed: I have personally reviewed following labs and imaging studies  CBC: Recent Labs  Lab 10/15/21 1721 10/15/21 1913 10/15/21 2353 10/18/21 0525  WBC 6.3  --  6.8 6.3  NEUTROABS 4.1  --   --  3.9  HGB 11.7* 12.6 10.6* 11.4*  HCT 39.8 37.0 34.1* 37.0  MCV 97.8  --  92.9 93.9  PLT 118*  --  109* 124*    Basic Metabolic Panel: Recent Labs  Lab 10/16/21 0643 10/16/21 1616 10/17/21 0517 10/17/21 1659 10/18/21 0525  NA 131* 134* 134* 133* 128*  K 5.8* 3.7 3.8 4.2 3.9  CL 96* 98 98 99 96*  CO2 23 25 25 23 24   GLUCOSE 287* 117* 38* 111* 221*  BUN 18 16 17 19  21*  CREATININE 5.03* 5.41* 6.32* 6.89* 7.65*  CALCIUM 9.4 9.8 10.1 9.8 9.3    GFR: Estimated Creatinine Clearance: 8.8 mL/min (  A) (by C-G formula based on SCr of 7.65 mg/dL (H)). Liver Function Tests: Recent Labs  Lab 10/15/21 1721  AST 31  ALT 15  ALKPHOS 141*  BILITOT 0.9  PROT 6.9  ALBUMIN 3.7    No results for input(s): LIPASE, AMYLASE in the last 168 hours. No results for input(s): AMMONIA in the last 168 hours. Coagulation Profile: No results for input(s): INR, PROTIME in the last 168 hours. Cardiac Enzymes: Recent Labs  Lab 10/15/21 1721  CKTOTAL 117    BNP (last 3 results) No results for input(s): PROBNP in the last 8760 hours. HbA1C: Recent Labs    10/15/21 2353  HGBA1C 8.3*    CBG: Recent Labs  Lab 10/17/21 2028 10/17/21 2352 10/18/21 0412 10/18/21 0806 10/18/21 1204  GLUCAP 127* 185* 250* 132* 217*    Lipid Profile: No results for input(s): CHOL, HDL, LDLCALC, TRIG, CHOLHDL, LDLDIRECT in the last 72 hours. Thyroid Function Tests: No results for input(s): TSH, T4TOTAL, FREET4, T3FREE, THYROIDAB in the last 72 hours. Anemia Panel: No results for input(s):  VITAMINB12, FOLATE, FERRITIN, TIBC, IRON, RETICCTPCT in the last 72 hours. Sepsis Labs: No results for input(s): PROCALCITON, LATICACIDVEN in the last 168 hours.  Recent Results (from the past 240 hour(s))  Resp Panel by RT-PCR (Flu A&B, Covid) Nasopharyngeal Swab     Status: None   Collection Time: 10/15/21  8:11 PM   Specimen: Nasopharyngeal Swab; Nasopharyngeal(NP) swabs in vial transport medium  Result Value Ref Range Status   SARS Coronavirus 2 by RT PCR NEGATIVE NEGATIVE Final    Comment: (NOTE) SARS-CoV-2 target nucleic acids are NOT DETECTED.  The SARS-CoV-2 RNA is generally detectable in upper respiratory specimens during the acute phase of infection. The lowest concentration of SARS-CoV-2 viral copies this assay can detect is 138 copies/mL. A negative result does not preclude SARS-Cov-2 infection and should not be used as the sole basis for treatment or other patient management decisions. A negative result may occur with  improper specimen collection/handling, submission of specimen other than nasopharyngeal swab, presence of viral mutation(s) within the areas targeted by this assay, and inadequate number of viral copies(<138 copies/mL). A negative result must be combined with clinical observations, patient history, and epidemiological information. The expected result is Negative.  Fact Sheet for Patients:  EntrepreneurPulse.com.au  Fact Sheet for Healthcare Providers:  IncredibleEmployment.be  This test is no t yet approved or cleared by the [place] FDA and  has been authorized for detection and/or diagnosis of SARS-CoV-2 by FDA under an Emergency Use Authorization (EUA). This EUA will remain  in effect (meaning this test can be used) for the duration of the COVID-19 declaration under Section 564(b)(1) of the Act, 21 U.S.C.section 360bbb-3(b)(1), unless the authorization is terminated  or revoked sooner.       Influenza A  by PCR NEGATIVE NEGATIVE Final   Influenza B by PCR NEGATIVE NEGATIVE Final    Comment: (NOTE) The Xpert Xpress SARS-CoV-2/FLU/RSV plus assay is intended as an aid in the diagnosis of influenza from Nasopharyngeal swab specimens and should not be used as a sole basis for treatment. Nasal washings and aspirates are unacceptable for Xpert Xpress SARS-CoV-2/FLU/RSV testing.  Fact Sheet for Patients: EntrepreneurPulse.com.au  Fact Sheet for Healthcare Providers: IncredibleEmployment.be  This test is not yet approved or cleared by the [place] FDA and has been authorized for detection and/or diagnosis of SARS-CoV-2 by FDA under an Emergency Use Authorization (EUA). This EUA will remain in effect (meaning this test can  be used) for the duration of the COVID-19 declaration under Section 564(b)(1) of the Act, 21 U.S.C. section 360bbb-3(b)(1), unless the authorization is terminated or revoked.  Performed at [facility], [place] [address], [place],  [id number]        Radiology Studies: EEG adult  Result Date: 10/21/21 Angela Jack, MD     2021/10/21  5:18 PM Routine EEG Report Angela [name] is a 35 y.o. female with a history of seizures who is undergoing an EEG to evaluate for seizures. Report: This EEG was acquired with electrodes placed according to the International 10-20 electrode system (including Fp1, Fp2, F3, F4, C3, C4, P3, P4, O1, O2, T3, T4, T5, T6, A1, A2, Fz, Cz, Pz). The following electrodes were missing or displaced: none. The occipital dominant rhythm was 9 Hz with overriding beta frequencies. This activity is reactive to stimulation. Drowsiness was manifested by background fragmentation; deeper stages of sleep were identified by K complexes and sleep spindles. There was no focal slowing. There were no interictal epileptiform discharges. There were no electrographic seizures identified. Photic stimulation and  hyperventilation were not performed. Impression: This EEG was obtained while awake and asleep and is normal.   Clinical Correlation: Normal EEGs, however, do not rule out epilepsy. Angela Monks, MD [facility] [phone] If 7pm- 7am, please page neurology on call as listed in [place].    Scheduled Meds:  amLODipine  10 mg Oral Daily   busPIRone  15 mg Oral QHS   Chlorhexidine Gluconate Cloth  6 each Topical Q0600   escitalopram  20 mg Oral Daily   gabapentin  600 mg Oral BID   heparin  5,000 Units Subcutaneous Q8H   hydrALAZINE  100 mg Oral TID   insulin aspart  0-6 Units Subcutaneous Q4H   levETIRAcetam  250 mg Oral Q M,W,F-1800   levETIRAcetam  500 mg Oral Daily   levothyroxine  175 mcg Oral q morning   pantoprazole  40 mg Oral Daily   pravastatin  20 mg Oral QHS   Continuous Infusions:   LOS: 3 days   Time spent: 29 minutes   Darliss Cheney, MD [facility]  10/18/2021, 1:02 PM  Please page via [place] [place] and do not message via secure chat for anything urgent. Secure chat can be used for anything non urgent.  How to contact the [facility] Attending or Consulting provider [place] or covering provider during after hours [place], for this patient?  Check the care team in [facility] and look for a) attending/consulting [facility] provider listed and b) the [facility] team listed. Page or secure chat [phone]. Log into [email or web address] and use [place]'s universal password to access. If you do not have the password, please contact the hospital operator. Locate the [facility] provider you are looking for under [facility] and page to a number that you can be directly reached. If you still have difficulty reaching the provider, please page the [facility] (Director on Call) for the Hospitalists listed on amion for assistance.

## 2021-10-18 NOTE — Progress Notes (Signed)
Pt receives out-pt HD at [facility] on MWF. Pt arrives at 5:30 for 5:45 chair time. Will assist as needed.   Melven Sartorius Renal Navigator [phone]

## 2021-10-18 NOTE — Plan of Care (Signed)

## 2021-10-19 DIAGNOSIS — E1069 Type 1 diabetes mellitus with other specified complication: Secondary | ICD-10-CM | POA: Diagnosis not present

## 2021-10-19 DIAGNOSIS — R569 Unspecified convulsions: Secondary | ICD-10-CM | POA: Diagnosis not present

## 2021-10-19 DIAGNOSIS — E162 Hypoglycemia, unspecified: Secondary | ICD-10-CM | POA: Diagnosis not present

## 2021-10-19 DIAGNOSIS — I120 Hypertensive chronic kidney disease with stage 5 chronic kidney disease or end stage renal disease: Secondary | ICD-10-CM | POA: Diagnosis not present

## 2021-10-19 DIAGNOSIS — N186 End stage renal disease: Secondary | ICD-10-CM | POA: Diagnosis not present

## 2021-10-19 DIAGNOSIS — Z992 Dependence on renal dialysis: Secondary | ICD-10-CM | POA: Diagnosis not present

## 2021-10-19 DIAGNOSIS — E870 Hyperosmolality and hypernatremia: Secondary | ICD-10-CM | POA: Diagnosis not present

## 2021-10-19 DIAGNOSIS — E1065 Type 1 diabetes mellitus with hyperglycemia: Secondary | ICD-10-CM | POA: Diagnosis not present

## 2021-10-19 DIAGNOSIS — D631 Anemia in chronic kidney disease: Secondary | ICD-10-CM | POA: Diagnosis not present

## 2021-10-19 LAB — CBC
HCT: 37.3 % (ref 36.0–46.0)
Hemoglobin: 11.6 g/dL — ABNORMAL LOW (ref 12.0–15.0)
MCH: 28.8 pg (ref 26.0–34.0)
MCHC: 31.1 g/dL (ref 30.0–36.0)
MCV: 92.6 fL (ref 80.0–100.0)
Platelets: 123 10*3/uL — ABNORMAL LOW (ref 150–400)
RBC: 4.03 MIL/uL (ref 3.87–5.11)
RDW: 13.7 % (ref 11.5–15.5)
WBC: 6.2 10*3/uL (ref 4.0–10.5)
nRBC: 0 % (ref 0.0–0.2)

## 2021-10-19 LAB — RENAL FUNCTION PANEL
Albumin: 3.2 g/dL — ABNORMAL LOW (ref 3.5–5.0)
Anion gap: 12 (ref 5–15)
BUN: 27 mg/dL — ABNORMAL HIGH (ref 6–20)
CO2: 23 mmol/L (ref 22–32)
Calcium: 9.5 mg/dL (ref 8.9–10.3)
Chloride: 93 mmol/L — ABNORMAL LOW (ref 98–111)
Creatinine, Ser: 8.83 mg/dL — ABNORMAL HIGH (ref 0.44–1.00)
GFR, Estimated: 5 mL/min — ABNORMAL LOW (ref >60)
Glucose, Bld: 153 mg/dL — ABNORMAL HIGH (ref 70–99)
Phosphorus: 4.9 mg/dL — ABNORMAL HIGH (ref 2.5–4.6)
Potassium: 4.4 mmol/L (ref 3.5–5.1)
Sodium: 128 mmol/L — ABNORMAL LOW (ref 135–145)

## 2021-10-19 LAB — GLUCOSE, CAPILLARY
Glucose-Capillary: 134 mg/dL — ABNORMAL HIGH (ref 70–99)
Glucose-Capillary: 148 mg/dL — ABNORMAL HIGH (ref 70–99)
Glucose-Capillary: 46 mg/dL — ABNORMAL LOW (ref 70–99)

## 2021-10-19 MED ORDER — LEVETIRACETAM 500 MG PO TABS: 500.0000 mg | ORAL_TABLET | Freq: Every day | ORAL | 0 refills | Status: DC

## 2021-10-19 MED ORDER — LEVETIRACETAM 250 MG PO TABS: 250.0000 mg | ORAL_TABLET | ORAL | 0 refills | Status: DC

## 2021-10-19 NOTE — Progress Notes (Signed)
Patient returned from HD and was promptly discharged.  Patient given dc instructions and verbalized understanding of medications and follow up appointments.  Discussed in detail new dosage of Keppra on days of HD.  Patient ask appropriate questions and verbalized understanding.  Patient refused Semglee insulin prior to dc.  Patient walked out per her preference with friend at side.  IV's dc's with no bleeding.

## 2021-10-19 NOTE — Discharge Summary (Signed)
Physician Discharge Summary  Victor Granados GNO:[id number] DOB: 10-31-1986 DOA: 10/15/2021  PCP: [name], MD  Admit date: 10/15/2021 Discharge date: 10/19/2021 30 Day Unplanned Readmission Risk Score    Flowsheet Row ED to Hosp-Admission (Current) from 10/15/2021 in [facility]  30 Day Unplanned Readmission Risk Score (%) 30.87 Filed at 10/19/2021 0400       This score is the patient's risk of an unplanned readmission within 30 days of being discharged (0 -100%). The score is based on dignosis, age, lab data, medications, orders, and past utilization.   Low:  0-14.9   Medium: 15-21.9   High: 22-29.9   Extreme: 30 and above          Admitted From: Home Disposition: Home  Recommendations for Outpatient Follow-up:  Follow up with PCP in 1-2 weeks Please obtain BMP/CBC in one week -Per [facility] statutes, patients with seizures are not allowed to drive until they have been seizure-free for six months. Use caution when using heavy equipment or power tools. Avoid working on ladders or at heights. Take showers instead of baths. Ensure the water temperature is not too high on the home water heater. Do not go swimming alone. Do not lock yourself in a room alone (i.e. bathroom). When caring for infants or small children, sit down when holding, feeding, or changing them to minimize risk of injury to the child in the event you have a seizure. Maintain good sleep hygiene. Avoid alcohol. If patient has another seizure, call 911 and bring them back to the ED if: A.  The seizure lasts longer than 5 minutes.      B.  The patient doesn't wake shortly after the seizure or has new problems such as difficulty seeing, speaking or moving following the seizure C.  The patient was injured during the seizure D.  The patient has a temperature over 102 F (39C) E.  The patient vomited during the seizure and now is having trouble breathing    During the Seizure    - First, ensure adequate ventilation and place patients on the floor on their left side  Loosen clothing around the neck and ensure the airway is patent. If the patient is clenching the teeth, do not force the mouth open with any object as this can cause severe damage - Remove all items from the surrounding that can be hazardous. The patient may be oblivious to what's happening and may not even know what he or she is doing. If the patient is confused and wandering, either gently guide him/her away and block access to outside areas - Reassure the individual and be comforting - Call 911. In most cases, the seizure ends before EMS arrives. However, there are cases when seizures may last over 3 to 5 minutes. Or the individual may have developed breathing difficulties or severe injuries. If a pregnant patient or a person with diabetes develops a seizure, it is prudent to call an ambulance. - Finally, if the patient does not regain full consciousness, then call EMS. Most patients will remain confused for about 45 to 90 minutes after a seizure, so you must use judgment in calling for help. - Avoid restraints but make sure the patient is in a bed with padded side rails - Place the individual in a lateral position with the neck slightly flexed; this will help the saliva drain from the mouth and prevent the tongue from falling backward - Remove all nearby furniture and other hazards  from the area - Provide verbal assurance as the individual is regaining consciousness - Provide the patient with privacy if possible - Call for help and start treatment as ordered by the caregiver    After the Seizure (Postictal Stage)   After a seizure, most patients experience confusion, fatigue, muscle pain and/or a headache. Thus, one should permit the individual to sleep. For the next few days, reassurance is essential. Being calm and helping reorient the person is also of importance.   Most seizures are painless and end  spontaneously. Seizures are not harmful to others but can lead to complications such as stress on the lungs, brain and the heart. Individuals with prior lung problems may develop labored breathing and respiratory distress. Please follow up with your PCP on the following pending results: Unresulted Labs (From admission, onward)     Start     Ordered   10/15/21 1856  Urinalysis, Routine w reflex microscopic  (Diabetes Ketoacidosis (DKA))  ONCE - STAT,   STAT        10/15/21 1857   Signed and Held  CBC  Once,   R       Question:  Specimen collection method  Answer:  Lab=Lab collect   Signed and Held   Signed and Held  Hepatitis B surface antigen  (New Admission Hemo Labs (Hepatitis B))  Once,   R       Question:  Specimen collection method  Answer:  Lab=Lab collect   Signed and Held   Signed and Held  Hepatitis B surface antibody  (New Admission Hemo Labs (Hepatitis B))  Once,   R       Question:  Specimen collection method  Answer:  Lab=Lab collect   Signed and Held   Signed and Held  Hepatitis B surface antibody,quantitative  (New Admission Hemo Labs (Hepatitis B))  Once,   R       Question:  Specimen collection method  Answer:  Lab=Lab collect   Signed and Held              Home Health: None Equipment/Devices: None  Discharge Condition: Stable CODE STATUS: Full code Diet recommendation: Diabetic  Subjective: Seen and examined in dialysis unit.  She has no complaints.  She has not had any episode of hypoglycemia or seizure in last 24 hours.  She is ready to discharge today.  Brief/Interim Summary: Carly Sabo is a 35 y.o. female with history of ESRD on hemodialysis, hypertension, diabetes mellitus type 1, migraine was brought to the ER after patient had a seizure as witnessed by patient's mother. patient had her dialysis on Friday and was at the patient's mother's house and was going to take her insulin when her right leg started shaking for which she fell onto the floor and her  right upper extremity started shaking and became unresponsive.  The whole episode lasted for around 2 to 3 minutes.  Did not have any incontinence of urine or bowel or tongue bite.  EMS was called and patient was brought to the ER on the way to the ER patient also had another seizure-like episode as per the report.   When the patient became more alert after the seizure in the ER patient states she had 3 episodes the day before.  Also had another episode about a month ago.  Patient was told to go to a neurologist.   In the ER patient had another episode of tonic-clonic activity as witnessed by the ER physician.  Lasted for around 2 minutes.  This time she bit her tongue as well.  Following which patient appeared confused.  Patient was given loading dose of Keppra 1 g and Ativan.  CT head and C-spine were unremarkable.  Labs show markedly elevated blood sugar with readings around 1017.  Anion gap is 17.  CT head C-spine were unremarkable chest x-ray unremarkable and COVID test was negative.  On-call neurologist was consulted.  She was admitted with following issues.  Hyperosmolar nonketotic state and type 1 diabetes melitis: Patient significantly hyperglycemic upon arrival, blood sugar> 1000.  She needed to be started on insulin drip.  Blood sugar improved, she was transition to long-acting Semglee 20 units with SSI.  She then had significant hypoglycemic episodes which were recurrent, long-acting was discontinued.  She was started on SSI.  She was seen by diabetes educator, per their recommendations, she has been started on NovoLog 3 times daily Premeal regimen and 5 units of cynically.  Along with SSI.  Her blood sugar is labile but she has not had any hypoglycemic episode since last 2 days.  Patient to resume her home medications, follow her primary doctors and continue to monitor her blood sugar very closely.   Seizures -could have been precipitated by hyperosmolar status.  Received Keppra, seen by  neurology.  Patient had another episode of seizure at 5 AM on 10/17/2021 but at that time, her blood sugar was 46.  She has not had any seizures since yesterday but patient is not convinced that hypoglycemia is causing the seizure.  EEG unremarkable.  Neurology signed off and recommended continuing current dose of Keppra which is 500 mg p.o. daily in addition to to 50 mg p.o MWF, on dialysis days, after dialysis.    Hypertensive urgency: Blood pressure significantly elevated upon arrival which improved with resuming home medications.  Continue home medications.   ESRD on hemodialysis: Follow outpatient dialysis.   Anemia of chronic disease: Hemoglobin is stable.   Hypothyroidism: Continue Synthroid.   Chest pain: She had no more chest pain today.  Cardiac enzymes flat and normal.  No EKG changes of ST-T wave.   Right leg pain: Patient claims that she has lymphadenopathy in the inguinal area bilaterally and she has right lower extremity pain and sometimes tremors.  On exam, there is minimal lymphadenopathy but her pain is unexplainable.  She looks comfortable.  Discharge Diagnoses:  Principal Problem:   Type 1 diabetes mellitus with hyperosmolar hyperglycemic state (HHS) ([place]) Active Problems:   Seizure ([place])   ESRD on dialysis [facility])   Essential hypertension    Discharge Instructions   Allergies as of 10/19/2021       Reactions   Ferumoxytol Itching   Lisinopril Other (See Comments)   hyperkalemia   Sulfamethoxazole Hives, Itching   Trimethoprim Hives        Medication List     STOP taking these medications    azithromycin 250 MG tablet Commonly known as: Zithromax Z-Pak       TAKE these medications    albuterol 108 (90 Base) MCG/ACT inhaler Commonly known as: Proventil HFA Inhale 2 puffs into the lungs every 6 (six) hours as needed for wheezing or shortness of breath.   amLODipine 10 MG tablet Commonly known as: NORVASC Take 1 tablet (10 mg total) by mouth  daily. Please make PCP appointment.   benzonatate 100 MG capsule Commonly known as: TESSALON Take 1 capsule (100 mg total) by mouth 2 (two) times daily as needed  for cough.   busPIRone 15 MG tablet Commonly known as: BUSPAR Take 1 tablet (15 mg total) by mouth at bedtime. What changed: when to take this   Dexcom G6 Sensor Misc APPLY 1 SENSOR EVERY 10 DAYS   Dexcom G6 Sensor Misc SMARTSIG:1 Topical Every 10 Days   Dexcom G6 Transmitter Misc See admin instructions.   diltiazem 240 MG 24 hr capsule Commonly known as: CARDIZEM CD Take 240 mg by mouth daily.   Emgality 120 MG/ML Soaj Generic drug: Galcanezumab-gnlm Inject 120 mg into the skin every 30 (thirty) days.   escitalopram 20 MG tablet Commonly known as: LEXAPRO TAKE ONE TABLET BY MOUTH ONCE DAILY   furosemide 40 MG tablet Commonly known as: LASIX TAKE ONE TABLET BY MOUTH daily AS NEEDED What changed: reasons to take this   gabapentin 300 MG capsule Commonly known as: NEURONTIN TAKE TWO CAPSULES BY MOUTH EVERY MORNING and TAKE TWO CAPSULES BY MOUTH EVERYDAY AT BEDTIME What changed: See the new instructions.   hydrALAZINE 50 MG tablet Commonly known as: APRESOLINE TAKE TWO TABLETS BY MOUTH EVERY MORNING and TAKE TWO TABLETS BY MOUTH AT NOON and TAKE TWO TABLETS BY MOUTH EVERYDAY AT BEDTIME What changed: See the new instructions.   Insulin Pen Needle 32G X 4 MM Misc Use to inject insulin once a day.   Insulin Syringe-Needle U-100 31G X 15/64" 1 ML Misc Commonly known as: BD Insulin Syringe Ultrafine Used to give daily insulin injections.   ketoconazole 2 % shampoo Commonly known as: NIZORAL Apply 1 application topically 2 (two) times a week. Wash off after each use What changed: additional instructions   levETIRAcetam 500 MG tablet Commonly known as: KEPPRA Take 1 tablet (500 mg total) by mouth daily.   levETIRAcetam 250 MG tablet Commonly known as: KEPPRA Take 1 tablet (250 mg total) by mouth every  Monday, Wednesday, and Friday at 6 PM. Please take along with Keppra 500 mg p.o. daily on the days of dialysis, after dialysis. Start taking on: October 20, 2021   levothyroxine 175 MCG tablet Commonly known as: SYNTHROID Take 175 mcg by mouth every morning.   losartan 50 MG tablet Commonly known as: COZAAR Take 1 tablet (50 mg total) by mouth daily.   multivitamin Tabs tablet Take 1 tablet by mouth at bedtime.   NovoLOG 100 UNIT/ML injection Generic drug: insulin aspart USE AS DIRECTED in insulin pump What changed: See the new instructions.   Nurtec 75 MG Tbdp Generic drug: Rimegepant Sulfate Take 75 mg by mouth daily as needed. For migraines. Take as close to onset of migraine as possible. One daily maximum.   ondansetron 4 MG disintegrating tablet Commonly known as: Zofran ODT Take 1 tablet (4 mg total) by mouth every 8 (eight) hours as needed for nausea or vomiting.   pantoprazole 40 MG tablet Commonly known as: PROTONIX Take 40 mg by mouth daily.   pravastatin 20 MG tablet Commonly known as: PRAVACHOL TAKE ONE TABLET BY MOUTH EVERYDAY AT BEDTIME What changed: See the new instructions.   Tyler Aas FlexTouch 100 UNIT/ML FlexTouch Pen Generic drug: insulin degludec Inject 12 Units into the skin daily as needed (high blood sugar).        Follow-up Information     [name], MD Follow up in 1 week(s).   Specialty: Internal Medicine Contact information: [place] [address] [phone]                Allergies  Allergen Reactions  Ferumoxytol Itching   Lisinopril Other (See Comments)    hyperkalemia   Sulfamethoxazole Hives and Itching   Trimethoprim Hives    Consultations: Neurology and nephrology   Procedures/Studies: DG Chest 2 View  Result Date: 10/05/2021 CLINICAL DATA:  Chest pain and hemoptysis EXAM: CHEST - 2 VIEW COMPARISON:  06/18/2021 FINDINGS: Cardiac shadow is within normal limits. Right-sided dialysis  catheter is again seen and stable. No focal infiltrate or effusion is noted. No bony abnormality is seen. IMPRESSION: No active cardiopulmonary disease. Electronically Signed   By: Inez Catalina M.D.   On: 10/05/2021 03:50   CT HEAD WO CONTRAST (5MM)  Result Date: 10/15/2021 CLINICAL DATA:  Neck trauma. The injuries injury mechanism. Seizure, acute, history of trauma. EXAM: CT HEAD WITHOUT CONTRAST CT CERVICAL SPINE WITHOUT CONTRAST TECHNIQUE: Multidetector CT imaging of the head and cervical spine was performed following the standard protocol without intravenous contrast. Multiplanar CT image reconstructions of the cervical spine were also generated. COMPARISON:  MR head without and with contrast 06/06/2020. CT head without contrast 08/16/2019 FINDINGS: CT HEAD FINDINGS Brain: No acute infarct, hemorrhage, or mass lesion is present. No significant white matter lesions are present. The ventricles are of normal size. No significant extraaxial fluid collection is present. The brainstem and cerebellum are within normal limits. Vascular: No hyperdense vessel or unexpected calcification. Skull: Calvarium is intact. No focal lytic or blastic lesions are present. No significant extracranial soft tissue lesion is present. Sinuses/Orbits: The paranasal sinuses and mastoid air cells are clear. Prominent polyp or mucous retention cyst is again noted in the right maxillary sinus. Mild mucosal thickening of the right maxillary sinus is also stable. The paranasal sinuses and mastoid air cells are otherwise clear. The globes and orbits are within normal limits. CT CERVICAL SPINE FINDINGS Alignment: No significant listhesis is present. Cervical lordosis is preserved. Skull base and vertebrae: No acute fracture. No primary bone lesion or focal pathologic process. Incomplete posterior spinal fusion evident at C7, a normal variant. Soft tissues and spinal canal: No prevertebral fluid or swelling. No visible canal hematoma. Disc  levels:  No significant focal stenosis is evident. Upper chest: Lung apices are clear. Thoracic inlet is within normal limits. IMPRESSION: 1. No acute trauma to the head or cervical spine. 2. Normal CT appearance of the brain. 3. Stable right maxillary sinus disease. Electronically Signed   By: San Morelle M.D.   On: 10/15/2021 19:11   CT Cervical Spine Wo Contrast  Result Date: 10/15/2021 CLINICAL DATA:  Neck trauma. The injuries injury mechanism. Seizure, acute, history of trauma. EXAM: CT HEAD WITHOUT CONTRAST CT CERVICAL SPINE WITHOUT CONTRAST TECHNIQUE: Multidetector CT imaging of the head and cervical spine was performed following the standard protocol without intravenous contrast. Multiplanar CT image reconstructions of the cervical spine were also generated. COMPARISON:  MR head without and with contrast 06/06/2020. CT head without contrast 08/16/2019 FINDINGS: CT HEAD FINDINGS Brain: No acute infarct, hemorrhage, or mass lesion is present. No significant white matter lesions are present. The ventricles are of normal size. No significant extraaxial fluid collection is present. The brainstem and cerebellum are within normal limits. Vascular: No hyperdense vessel or unexpected calcification. Skull: Calvarium is intact. No focal lytic or blastic lesions are present. No significant extracranial soft tissue lesion is present. Sinuses/Orbits: The paranasal sinuses and mastoid air cells are clear. Prominent polyp or mucous retention cyst is again noted in the right maxillary sinus. Mild mucosal thickening of the right maxillary sinus is also stable. The  paranasal sinuses and mastoid air cells are otherwise clear. The globes and orbits are within normal limits. CT CERVICAL SPINE FINDINGS Alignment: No significant listhesis is present. Cervical lordosis is preserved. Skull base and vertebrae: No acute fracture. No primary bone lesion or focal pathologic process. Incomplete posterior spinal fusion  evident at C7, a normal variant. Soft tissues and spinal canal: No prevertebral fluid or swelling. No visible canal hematoma. Disc levels:  No significant focal stenosis is evident. Upper chest: Lung apices are clear. Thoracic inlet is within normal limits. IMPRESSION: 1. No acute trauma to the head or cervical spine. 2. Normal CT appearance of the brain. 3. Stable right maxillary sinus disease. Electronically Signed   By: San Morelle M.D.   On: 10/15/2021 19:11   MR BRAIN WO CONTRAST  Result Date: 10/16/2021 CLINICAL DATA:  Seizure with abnormal neuro exam EXAM: MRI HEAD WITHOUT CONTRAST TECHNIQUE: Multiplanar, multiecho pulse sequences of the brain and surrounding structures were obtained without intravenous contrast. COMPARISON:  Head CT from yesterday FINDINGS: Brain: No acute infarction, hemorrhage, hydrocephalus, extra-axial collection or mass lesion. Vascular: Normal flow voids. Skull and upper cervical spine: Diffusely hypointense marrow signal in the cervical spine, without focal lesion, stable from 2021. Appearance is likely related to chart history of end-stage renal disease and iron deficiency. Sinuses/Orbits: Large retention cyst appearance in the right maxillary sinus. IMPRESSION: No acute finding or visible seizure focus. Electronically Signed   By: Jorje Guild M.D.   On: 10/16/2021 05:17   DG Chest Portable 1 View  Result Date: 10/15/2021 CLINICAL DATA:  Seizure EXAM: PORTABLE CHEST 1 VIEW COMPARISON:  10/05/2021, 06/19/2021 FINDINGS: Right-sided central venous catheter tip over the right atrium. Borderline to mild cardiomegaly. No focal opacity, pleural effusion, or pneumothorax IMPRESSION: No active disease. Electronically Signed   By: Donavan Foil M.D.   On: 10/15/2021 19:30   EEG adult  Result Date: 10/16/2021 Derek Jack, MD     10/16/2021  5:18 PM Routine EEG Report Maizee Reinhold is a 35 y.o. female with a history of seizures who is undergoing an EEG to  evaluate for seizures. Report: This EEG was acquired with electrodes placed according to the International 10-20 electrode system (including Fp1, Fp2, F3, F4, C3, C4, P3, P4, O1, O2, T3, T4, T5, T6, A1, A2, Fz, Cz, Pz). The following electrodes were missing or displaced: none. The occipital dominant rhythm was 9 Hz with overriding beta frequencies. This activity is reactive to stimulation. Drowsiness was manifested by background fragmentation; deeper stages of sleep were identified by K complexes and sleep spindles. There was no focal slowing. There were no interictal epileptiform discharges. There were no electrographic seizures identified. Photic stimulation and hyperventilation were not performed. Impression: This EEG was obtained while awake and asleep and is normal.   Clinical Correlation: Normal EEGs, however, do not rule out epilepsy. Su Monks, MD [facility] [phone] If 7pm- 7am, please page neurology on call as listed in [place].   VAS [place] LOWER EXTREMITY VENOUS (DVT) (7a-7p)  Result Date: 10/05/2021  Lower Venous DVT Study Patient Name:  MELANNIE METZNER  Date of Exam:   10/05/2021 Medical Rec #: [id number]       Accession #:    [id number] Date of Birth: Nov 23, 1986       Patient Gender: F Patient Age:   56 years Exam Location[facility]  [facility] Procedure:      VAS [place] LOWER EXTREMITY VENOUS (DVT) Referring Phys: Aldona Bar PETRUCELLI --------------------------------------------------------------------------------  Indications: Pain.  Comparison Study: no prior Performing Technologist: Archie Patten RVS  Examination Guidelines: A complete evaluation includes B-mode imaging, spectral Doppler, color Doppler, and power Doppler as needed of all accessible portions of each vessel. Bilateral testing is considered an integral part of a complete examination. Limited examinations for reoccurring indications may be performed as noted. The reflux portion of the exam is performed with the patient  in reverse Trendelenburg.  +---------+---------------+---------+-----------+----------+--------------+ RIGHT    CompressibilityPhasicitySpontaneityPropertiesThrombus Aging +---------+---------------+---------+-----------+----------+--------------+ CFV      Full           Yes      Yes                                 +---------+---------------+---------+-----------+----------+--------------+ SFJ      Full                                                        +---------+---------------+---------+-----------+----------+--------------+ FV Prox  Full                                                        +---------+---------------+---------+-----------+----------+--------------+ FV Mid   Full                                                        +---------+---------------+---------+-----------+----------+--------------+ FV DistalFull                                                        +---------+---------------+---------+-----------+----------+--------------+ PFV      Full                                                        +---------+---------------+---------+-----------+----------+--------------+ POP      Full           Yes      Yes                                 +---------+---------------+---------+-----------+----------+--------------+ PTV      Full                                                        +---------+---------------+---------+-----------+----------+--------------+ PERO     Full                                                        +---------+---------------+---------+-----------+----------+--------------+   +----+---------------+---------+-----------+----------+--------------+  LEFTCompressibilityPhasicitySpontaneityPropertiesThrombus Aging +----+---------------+---------+-----------+----------+--------------+ CFV Full           Yes      Yes                                  +----+---------------+---------+-----------+----------+--------------+     Summary: RIGHT: - There is no evidence of deep vein thrombosis in the lower extremity.  - No cystic structure found in the popliteal fossa.  LEFT: - No evidence of common femoral vein obstruction.  *See table(s) above for measurements and observations. Electronically signed by Deitra Mayo MD on 10/05/2021 at 9:46:33 AM.    Final      Discharge Exam: Vitals:   10/19/21 0930 10/19/21 1000  BP: 128/81 137/90  Pulse:    Resp: 14 11  Temp:    SpO2:     Vitals:   10/19/21 0830 10/19/21 0900 10/19/21 0930 10/19/21 1000  BP: (!) 155/90 131/81 128/81 137/90  Pulse:      Resp: 13 11 14 11   Temp:      TempSrc:      SpO2:      Weight:      Height:        General: Pt is alert, awake, not in acute distress Cardiovascular: RRR, S1/S2 +, no rubs, no gallops Respiratory: CTA bilaterally, no wheezing, no rhonchi Abdominal: Soft, NT, ND, bowel sounds + Extremities: no edema, no cyanosis    The results of significant diagnostics from this hospitalization (including imaging, microbiology, ancillary and laboratory) are listed below for reference.     Microbiology: Recent Results (from the past 240 hour(s))  Resp Panel by RT-PCR (Flu A&B, Covid) Nasopharyngeal Swab     Status: None   Collection Time: 10/15/21  8:11 PM   Specimen: Nasopharyngeal Swab; Nasopharyngeal(NP) swabs in vial transport medium  Result Value Ref Range Status   SARS Coronavirus 2 by RT PCR NEGATIVE NEGATIVE Final    Comment: (NOTE) SARS-CoV-2 target nucleic acids are NOT DETECTED.  The SARS-CoV-2 RNA is generally detectable in upper respiratory specimens during the acute phase of infection. The lowest concentration of SARS-CoV-2 viral copies this assay can detect is 138 copies/mL. A negative result does not preclude SARS-Cov-2 infection and should not be used as the sole basis for treatment or other patient management decisions. A  negative result may occur with  improper specimen collection/handling, submission of specimen other than nasopharyngeal swab, presence of viral mutation(s) within the areas targeted by this assay, and inadequate number of viral copies(<138 copies/mL). A negative result must be combined with clinical observations, patient history, and epidemiological information. The expected result is Negative.  Fact Sheet for Patients:  EntrepreneurPulse.com.au  Fact Sheet for Healthcare Providers:  IncredibleEmployment.be  This test is no t yet approved or cleared by the [place] FDA and  has been authorized for detection and/or diagnosis of SARS-CoV-2 by FDA under an Emergency Use Authorization (EUA). This EUA will remain  in effect (meaning this test can be used) for the duration of the COVID-19 declaration under Section 564(b)(1) of the Act, 21 U.S.C.section 360bbb-3(b)(1), unless the authorization is terminated  or revoked sooner.       Influenza A by PCR NEGATIVE NEGATIVE Final   Influenza B by PCR NEGATIVE NEGATIVE Final    Comment: (NOTE) The Xpert Xpress SARS-CoV-2/FLU/RSV plus assay is intended as an aid in the diagnosis of influenza from Nasopharyngeal swab specimens and should not be used  as a sole basis for treatment. Nasal washings and aspirates are unacceptable for Xpert Xpress SARS-CoV-2/FLU/RSV testing.  Fact Sheet for Patients: EntrepreneurPulse.com.au  Fact Sheet for Healthcare Providers: IncredibleEmployment.be  This test is not yet approved or cleared by the [place] FDA and has been authorized for detection and/or diagnosis of SARS-CoV-2 by FDA under an Emergency Use Authorization (EUA). This EUA will remain in effect (meaning this test can be used) for the duration of the COVID-19 declaration under Section 564(b)(1) of the Act, 21 U.S.C. section 360bbb-3(b)(1), unless the authorization  is terminated or revoked.  Performed at [facility], [place] [address] [place], [place] [address]      Labs: BNP (last 3 results) No results for input(s): BNP in the last 8760 hours. Basic Metabolic Panel: Recent Labs  Lab 10/17/21 0517 10/17/21 1659 10/18/21 0525 10/18/21 1709 10/19/21 0631  NA 134* 133* 128* 127* 128*  K 3.8 4.2 3.9 4.5 4.4  CL 98 99 96* 96* 93*  CO2 25 23 24 23 23   GLUCOSE 38* 111* 221* 271* 153*  BUN 17 19 21* 24* 27*  CREATININE 6.32* 6.89* 7.65* 8.44* 8.83*  CALCIUM 10.1 9.8 9.3 9.2 9.5  PHOS  --   --   --   --  4.9*   Liver Function Tests: Recent Labs  Lab 10/15/21 1721 10/19/21 0631  AST 31  --   ALT 15  --   ALKPHOS 141*  --   BILITOT 0.9  --   PROT 6.9  --   ALBUMIN 3.7 3.2*   No results for input(s): LIPASE, AMYLASE in the last 168 hours. No results for input(s): AMMONIA in the last 168 hours. CBC: Recent Labs  Lab 10/15/21 1721 10/15/21 1913 10/15/21 2353 10/18/21 0525 10/19/21 0631  WBC 6.3  --  6.8 6.3 6.2  NEUTROABS 4.1  --   --  3.9  --   HGB 11.7* 12.6 10.6* 11.4* 11.6*  HCT 39.8 37.0 34.1* 37.0 37.3  MCV 97.8  --  92.9 93.9 92.6  PLT 118*  --  109* 124* 123*   Cardiac Enzymes: Recent Labs  Lab 10/15/21 1721  CKTOTAL 117   BNP: Invalid input(s): POCBNP CBG: Recent Labs  Lab 10/18/21 1204 10/18/21 1727 10/18/21 2158 10/19/21 0017 10/19/21 0617  GLUCAP 217* 274* 83 134* 148*   D-Dimer No results for input(s): DDIMER in the last 72 hours. Hgb A1c No results for input(s): HGBA1C in the last 72 hours. Lipid Profile No results for input(s): CHOL, HDL, LDLCALC, TRIG, CHOLHDL, LDLDIRECT in the last 72 hours. Thyroid function studies No results for input(s): TSH, T4TOTAL, T3FREE, THYROIDAB in the last 72 hours.  Invalid input(s): FREET3 Anemia work up No results for input(s): VITAMINB12, FOLATE, FERRITIN, TIBC, IRON, RETICCTPCT in the last 72 hours. Urinalysis    Component Value Date/Time    COLORURINE STRAW (A) 11/16/2020 0204   APPEARANCEUR CLEAR 11/16/2020 0204   APPEARANCEUR Clear 04/30/2019 1141   LABSPEC 1.008 11/16/2020 0204   PHURINE 6.0 11/16/2020 0204   GLUCOSEU >=500 (A) 11/16/2020 0204   GLUCOSEU 100 (A) 10/14/2009 2309   HGBUR SMALL (A) 11/16/2020 0204   BILIRUBINUR NEGATIVE 11/16/2020 0204   BILIRUBINUR Negative 04/30/2019 1141   BILIRUBINUR negative 12/30/2016 [place] 11/16/2020 0204   PROTEINUR 100 (A) 11/16/2020 0204   UROBILINOGEN 0.2 07/19/2018 1351   UROBILINOGEN 0.2 10/24/2015 0029   NITRITE NEGATIVE 11/16/2020 0204   LEUKOCYTESUR NEGATIVE 11/16/2020 0204   Sepsis Labs  Invalid input(s): PROCALCITONIN,  WBC,  LACTICIDVEN Microbiology Recent Results (from the past 240 hour(s))  Resp Panel by RT-PCR (Flu A&B, Covid) Nasopharyngeal Swab     Status: None   Collection Time: 10/15/21  8:11 PM   Specimen: Nasopharyngeal Swab; Nasopharyngeal(NP) swabs in vial transport medium  Result Value Ref Range Status   SARS Coronavirus 2 by RT PCR NEGATIVE NEGATIVE Final    Comment: (NOTE) SARS-CoV-2 target nucleic acids are NOT DETECTED.  The SARS-CoV-2 RNA is generally detectable in upper respiratory specimens during the acute phase of infection. The lowest concentration of SARS-CoV-2 viral copies this assay can detect is 138 copies/mL. A negative result does not preclude SARS-Cov-2 infection and should not be used as the sole basis for treatment or other patient management decisions. A negative result may occur with  improper specimen collection/handling, submission of specimen other than nasopharyngeal swab, presence of viral mutation(s) within the areas targeted by this assay, and inadequate number of viral copies(<138 copies/mL). A negative result must be combined with clinical observations, patient history, and epidemiological information. The expected result is Negative.  Fact Sheet for Patients:   EntrepreneurPulse.com.au  Fact Sheet for Healthcare Providers:  IncredibleEmployment.be  This test is no t yet approved or cleared by the [place] FDA and  has been authorized for detection and/or diagnosis of SARS-CoV-2 by FDA under an Emergency Use Authorization (EUA). This EUA will remain  in effect (meaning this test can be used) for the duration of the COVID-19 declaration under Section 564(b)(1) of the Act, 21 U.S.C.section 360bbb-3(b)(1), unless the authorization is terminated  or revoked sooner.       Influenza A by PCR NEGATIVE NEGATIVE Final   Influenza B by PCR NEGATIVE NEGATIVE Final    Comment: (NOTE) The Xpert Xpress SARS-CoV-2/FLU/RSV plus assay is intended as an aid in the diagnosis of influenza from Nasopharyngeal swab specimens and should not be used as a sole basis for treatment. Nasal washings and aspirates are unacceptable for Xpert Xpress SARS-CoV-2/FLU/RSV testing.  Fact Sheet for Patients: EntrepreneurPulse.com.au  Fact Sheet for Healthcare Providers: IncredibleEmployment.be  This test is not yet approved or cleared by the [place] FDA and has been authorized for detection and/or diagnosis of SARS-CoV-2 by FDA under an Emergency Use Authorization (EUA). This EUA will remain in effect (meaning this test can be used) for the duration of the COVID-19 declaration under Section 564(b)(1) of the Act, 21 U.S.C. section 360bbb-3(b)(1), unless the authorization is terminated or revoked.  Performed at [facility], [place] [address] [place], [place] [address]      Time coordinating discharge: Over 30 minutes  SIGNED:   Darliss Cheney, MD  [facility] 10/19/2021, 10:28 AM  If 7PM-7AM, please contact night-coverage [email or web address]

## 2021-10-19 NOTE — Progress Notes (Signed)
Spoke to [place] at [facility] to advise her of pt's d/c today and to resume care tomorrow. Clinic has access to retreive d/c summary.  Melven Sartorius Renal Navigator [phone]

## 2021-10-19 NOTE — Progress Notes (Signed)
   10/19/21 1104  Vitals  Temp 98.7 F (37.1 C)  Temp Source Oral  BP (!) 162/94  BP Location Right Arm  BP Method Automatic  Patient Position (if appropriate) Lying  Pulse Rate 97  Pulse Rate Source Monitor  Resp 16  Oxygen Therapy  SpO2 97 %  O2 Device Room Air  Pain Assessment  Pain Scale 0-10  Pain Score 0  Post-Hemodialysis Assessment  Rinseback Volume (mL) 250 mL  KECN 228 V  Dialyzer Clearance Lightly streaked  Duration of HD Treatment -hour(s) 3.5 hour(s)  Hemodialysis Intake (mL) 500 mL  UF Total -Machine (mL) 2200 mL  Net UF (mL) 1700 mL  Tolerated HD Treatment Yes  Post-Hemodialysis Comments tx complete-pt stable  Fistula / Graft Left Upper arm Arteriovenous fistula  No placement date or time found.   Placed prior to admission: Yes  Orientation: Left  Access Location: Upper arm  Access Type: Arteriovenous fistula  Site Condition No complications  Fistula / Graft Assessment Present;Thrill;Bruit  Status Deaccessed;Flushed  Drainage Description None  HD complete, pt stable.
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
## 2021-10-19 NOTE — Progress Notes (Signed)
Nephrology Follow-Up Consult note   Assessment/Recommendations: Angela [name] is a/an 35 y.o. female with a past medical history significant for DM1 and ESRD, admitted for seizures.     **Seizure DO: has been started on keppra - dosed for ESRD with supplement post HD. Mgmt per neurology.   **ESRD on HD: MWF at [facility].  3.5h treatment.  HD this AM then ok to DC from nephrology stand point. Resume outpatient schedule tomorrow   **HTN: BP acceptable. Cont home meds. UF on HD.   **Anemia: Hb 11.6. No treatment needed   **BMM:  no meds needed for now. Obtain records if she remains inpatient through the week   **DM type 1: per primary. A1c 8.3.   Recommendations conveyed to primary service.    [facility] 10/19/2021 7:47 AM  ___________________________________________________________  CC: ESRD, seizures  Interval History/Subjective: Some concern about missing HD today but doing okay this morning. Tolerating HD with no issues.   Medications:  Current Facility-Administered Medications  Medication Dose Route Frequency Provider Last Rate Last Admin   acetaminophen (TYLENOL) tablet 650 mg  650 mg Oral Q6H PRN Darliss Cheney, MD   650 mg at 10/18/21 0959   albuterol (PROVENTIL) (2.5 MG/3ML) 0.083% nebulizer solution 3 mL  3 mL Inhalation Q6H PRN Darliss Cheney, MD       amLODipine (NORVASC) tablet 10 mg  10 mg Oral Daily Darliss Cheney, MD   10 mg at 10/18/21 0959   busPIRone (BUSPAR) tablet 15 mg  15 mg Oral QHS Darliss Cheney, MD   15 mg at 10/18/21 2143   Chlorhexidine Gluconate Cloth 2 % PADS 6 each  6 each Topical Q0600 Justin Mend, MD   6 each at 10/19/21 0619   dextrose 50 % solution 0-50 mL  0-50 mL Intravenous PRN Rise Patience, MD   25 mL at 10/17/21 0645   escitalopram (LEXAPRO) tablet 20 mg  20 mg Oral Daily Darliss Cheney, MD   20 mg at 10/18/21 0959   gabapentin (NEURONTIN) capsule 600 mg  600 mg Oral BID Darliss Cheney, MD    600 mg at 10/18/21 2143   heparin injection 5,000 Units  5,000 Units Subcutaneous Q8H Rise Patience, MD       hydrALAZINE (APRESOLINE) injection 10 mg  10 mg Intravenous Q4H PRN Rise Patience, MD   10 mg at 10/18/21 1425   hydrALAZINE (APRESOLINE) tablet 100 mg  100 mg Oral TID Darliss Cheney, MD   100 mg at 10/18/21 2142   insulin aspart (novoLOG) injection 0-5 Units  0-5 Units Subcutaneous QHS Pahwani, Einar Grad, MD       insulin aspart (novoLOG) injection 0-6 Units  0-6 Units Subcutaneous TID WC Darliss Cheney, MD   3 Units at 10/18/21 1739   insulin aspart (novoLOG) injection 2 Units  2 Units Subcutaneous TID WC Darliss Cheney, MD   2 Units at 10/18/21 1739   insulin glargine-yfgn (SEMGLEE) injection 5 Units  5 Units Subcutaneous Daily Darliss Cheney, MD   5 Units at 10/18/21 1424   levETIRAcetam (KEPPRA) tablet 250 mg  250 mg Oral Q M,W,F-1800 Greta Doom, MD   250 mg at 10/18/21 2143   levETIRAcetam (KEPPRA) tablet 500 mg  500 mg Oral Daily Greta Doom, MD   500 mg at 10/18/21 2355   levothyroxine (SYNTHROID) tablet 175 mcg  175 mcg Oral q morning Darliss Cheney, MD   175 mcg at 10/18/21 0628   ondansetron [facility])  injection 4 mg  4 mg Intravenous Q6H PRN Darliss Cheney, MD   4 mg at 10/18/21 2148   pantoprazole (PROTONIX) EC tablet 40 mg  40 mg Oral Daily Pahwani, Einar Grad, MD   40 mg at 10/18/21 0959   pravastatin (PRAVACHOL) tablet 20 mg  20 mg Oral QHS Darliss Cheney, MD   20 mg at 10/18/21 2142      Review of Systems: 10 systems reviewed and negative except per interval history/subjective  Physical Exam: Vitals:   10/19/21 0612 10/19/21 0714  BP: (!) 147/93 (!) 150/91  Pulse: 96 92  Resp: 15 15  Temp: 98.3 F (36.8 C) 97.9 F (36.6 C)  SpO2: 100% 98%   No intake/output data recorded.  Intake/Output Summary (Last 24 hours) at 10/19/2021 0747 Last data filed at 10/18/2021 1830 Gross per 24 hour  Intake 500 ml  Output --  Net 500 ml   Constitutional:  well-appearing, no acute distress ENMT: ears and nose without scars or lesions, MMM CV: normal rate, no edema Respiratory: Bilateral chest rise, normal work of breathing Gastrointestinal: soft, non-tender, no palpable masses or hernias Skin: no visible lesions or rashes Psych: alert, judgement/insight appropriate, appropriate mood and affect   Test Results I personally reviewed new and old clinical labs and radiology tests Lab Results  Component Value Date   NA 128 (L) 10/19/2021   K 4.4 10/19/2021   CL 93 (L) 10/19/2021   CO2 23 10/19/2021   BUN 27 (H) 10/19/2021   CREATININE 8.83 (H) 10/19/2021   CALCIUM 9.5 10/19/2021   ALBUMIN 3.2 (L) 10/19/2021   PHOS 4.9 (H) 10/19/2021

## 2021-10-19 NOTE — Plan of Care (Signed)

## 2021-10-20 ENCOUNTER — Telehealth: Payer: Self-pay

## 2021-10-20 DIAGNOSIS — D631 Anemia in chronic kidney disease: Secondary | ICD-10-CM | POA: Diagnosis not present

## 2021-10-20 DIAGNOSIS — D509 Iron deficiency anemia, unspecified: Secondary | ICD-10-CM | POA: Diagnosis not present

## 2021-10-20 DIAGNOSIS — E1029 Type 1 diabetes mellitus with other diabetic kidney complication: Secondary | ICD-10-CM | POA: Diagnosis not present

## 2021-10-20 DIAGNOSIS — N186 End stage renal disease: Secondary | ICD-10-CM | POA: Diagnosis not present

## 2021-10-20 DIAGNOSIS — E039 Hypothyroidism, unspecified: Secondary | ICD-10-CM | POA: Diagnosis not present

## 2021-10-20 DIAGNOSIS — N2581 Secondary hyperparathyroidism of renal origin: Secondary | ICD-10-CM | POA: Diagnosis not present

## 2021-10-20 NOTE — Telephone Encounter (Signed)
Transition Care Management Follow-up Telephone Call   Date of discharge and from where:[facility] on 10/19/2021 How have you been since you were released from the hospital? Still week. Checked BS 212 non fasting Any questions or concerns? No questions/concerns reported.  Items Reviewed: Did the pt receive and understand the discharge instructions provided? have the instructions and have no questions.  Medications obtained and verified? She said that she have the medication list  and the hospital staff reviewed them in detail prior to discharge. She said that she has all of the medications and they have no questions.  Any new allergies since your discharge? None reported  Do you have support at home? Yes, mother  Other (ie: DME, Home Health, etc)       N/A Functional Questionnaire: (I = Independent and D = Dependent) ADL's:  Independent.        Follow up appointments reviewed:   PCP Hospital f/u appt confirmed? PA Cari Mayers on 10/25/2021 Specialist Hospital f/u appt confirmed? None scheduled at this time  Are transportation arrangements needed? have transportation   If their condition worsens, is the pt aware to call  their PCP or go to the ED? Yes.Made pt aware if condition worsen or start experiencing rapid weight gain, chest pain, diff breathing, SOB, high fevers, or bleading to refer imediately to ED for further evaluation.  Was the patient provided with contact information for the PCP's office or ED? She has the phone number  Was the pt encouraged to call back with questions or concerns?yes

## 2021-10-20 NOTE — Telephone Encounter (Signed)
Transition Care Management Unsuccessful Follow-up Telephone Call  Date of discharge and from where:  10/19/2021-[facility]  Attempts:  1st Attempt  Reason for unsuccessful TCM follow-up call:  Left voice message

## 2021-10-21 ENCOUNTER — Other Ambulatory Visit: Payer: Self-pay

## 2021-10-21 ENCOUNTER — Ambulatory Visit: Payer: Medicaid Other | Attending: Internal Medicine | Admitting: Internal Medicine

## 2021-10-21 DIAGNOSIS — N186 End stage renal disease: Secondary | ICD-10-CM

## 2021-10-21 DIAGNOSIS — M79604 Pain in right leg: Secondary | ICD-10-CM

## 2021-10-21 DIAGNOSIS — D631 Anemia in chronic kidney disease: Secondary | ICD-10-CM

## 2021-10-21 DIAGNOSIS — I120 Hypertensive chronic kidney disease with stage 5 chronic kidney disease or end stage renal disease: Secondary | ICD-10-CM

## 2021-10-21 DIAGNOSIS — Z09 Encounter for follow-up examination after completed treatment for conditions other than malignant neoplasm: Secondary | ICD-10-CM

## 2021-10-21 DIAGNOSIS — G40909 Epilepsy, unspecified, not intractable, without status epilepticus: Secondary | ICD-10-CM | POA: Diagnosis not present

## 2021-10-21 DIAGNOSIS — Z992 Dependence on renal dialysis: Secondary | ICD-10-CM | POA: Diagnosis not present

## 2021-10-21 DIAGNOSIS — R079 Chest pain, unspecified: Secondary | ICD-10-CM

## 2021-10-21 DIAGNOSIS — E1029 Type 1 diabetes mellitus with other diabetic kidney complication: Secondary | ICD-10-CM

## 2021-10-21 MED ORDER — LEVETIRACETAM 250 MG PO TABS: 250.0000 mg | ORAL_TABLET | ORAL | 1 refills | Status: DC

## 2021-10-21 MED ORDER — LEVETIRACETAM 500 MG PO TABS: 500.0000 mg | ORAL_TABLET | Freq: Every day | ORAL | 1 refills | Status: DC

## 2021-10-21 NOTE — Telephone Encounter (Signed)
Transition Care Management Unsuccessful Follow-up Telephone Call  Date of discharge and from where:  10/19/2021 from [facility]  Attempts:  2nd Attempt  Reason for unsuccessful TCM follow-up call:  Left voice message

## 2021-10-21 NOTE — Progress Notes (Signed)
Patient ID: Angela [name], female   DOB: 06/12/1986, 35 y.o.   MRN: [id number] Virtual Visit via Telephone Note  I connected with Angela [name] on 10/21/2021 at 11:46 AM by telephone and verified that I am speaking with the correct person using two identifiers  Location: Patient: home Provider: office  Participants: Myself Patient  I discussed the limitations, risks, security and privacy concerns of performing an evaluation and management service by telephone and the availability of in person appointments. I also discussed with the patient that there may be a patient responsible charge related to this service. The patient expressed understanding and agreed to proceed.   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 Date of hospitalization: 10/28-11/12/2020 Date of call from case worker 10/20/2021   Patient hospitalized with new onset witnessed seizure (at home and in the emergency room) in the presence of hyperglycemia and hypertensive urgency.  Patient reportedly had had previous episode about a month to 2 months ago for which she was seen in the emergency room. Seen by neurology.  EEG unremarkable.  CT scan of head and neck unremarkable. Neurology felt seizure could have been precipitated by hyperosmolar status.  She was started on Keppra.  Discharged on Keppra 500 mg daily plus an additional 250 mg on her dialysis days which are Monday Wednesday and Fridays. Blood sugar was found to be 1017 with an anion gap of 17.  Placed on insulin drip and then transition to Semglee 20 units with sliding scale NovoLog. Blood pressure was significantly elevated upon arrival but improved with resuming her home medications.  She was noted to be anemic with stable hemoglobin around 11. She had complained of some chest pains.  Cardiac enzymes were normal.  EKG  revealed no acute changes. She had also complained of right leg pain and told them that she had some lymphadenopathy in the inguinal area bilaterally.  On exam in the hospital there was minimal lymphadenopathy.  Today: No seizures since hospital discharge. She is taking the Keppra as prescribed.  She does not have an appointment with neurology. She endorses she has been informed that she should not drive until she is seizure-free for 6 months She tells me that her right leg has been bothering her for some time.  She also notes that she gets a cramp in the leg right before her seizures.  She would like for me to evaluate the leg.  She was given an appointment after her last visit with me in September but she has no showed twice.  DM: Patient states that she has not been wearing her insulin pump for some time.  She needs to schedule an appointment with her endocrinologist to have the pump recalibrated before she can start using it again.  She plans to call them today. -On Tresiba 20 units daily and NovoLog sliding scale. -Blood sugars since leaving the hospital have been between 145-170.  HTN: She reports compliance with taking her blood pressure medications since leaving the hospital.  She endorses being on Cardizem, amlodipine, Cozaar, hydralazine and furosemide. -No device to check blood pressure.  She would like to get one if her insurance covers for it. -Reports intermittent chest pains and fluttering in the chest for over a year.  She has been concerned about her EKG findings.  She would like to be referred to the cardiologist  for further evaluation.  ESRD: Reports compliance with going to dialysis Monday Wednesday and Fridays.  HM:  Had flu shot 3 wks ago in HD Outpatient Encounter Medications as of 10/21/2021  Medication Sig Note   albuterol (PROVENTIL HFA) 108 (90 Base) MCG/ACT inhaler Inhale 2 puffs into the lungs every 6 (six) hours as needed for wheezing or shortness of breath.     amLODipine (NORVASC) 10 MG tablet Take 1 tablet (10 mg total) by mouth daily. Please make PCP appointment.    benzonatate (TESSALON) 100 MG capsule Take 1 capsule (100 mg total) by mouth 2 (two) times daily as needed for cough.    busPIRone (BUSPAR) 15 MG tablet Take 1 tablet (15 mg total) by mouth at bedtime. (Patient taking differently: Take 15 mg by mouth in the morning.)    Continuous Blood Gluc Sensor (DEXCOM G6 SENSOR) MISC APPLY 1 SENSOR EVERY 10 DAYS 10/15/2021: Pt does not have the DECOM on at  this time   Continuous Blood Gluc Sensor (DEXCOM G6 SENSOR) MISC SMARTSIG:1 Topical Every 10 Days 10/15/2021: Pt does not have the DECOM on at this time   Continuous Blood Gluc Transmit (DEXCOM G6 TRANSMITTER) MISC See admin instructions. 10/15/2021: Pt does not have the DECOM on at this time   diltiazem (CARDIZEM CD) 240 MG 24 hr capsule Take 240 mg by mouth daily.    escitalopram (LEXAPRO) 20 MG tablet TAKE ONE TABLET BY MOUTH ONCE DAILY (Patient taking differently: Take 20 mg by mouth daily.)    furosemide (LASIX) 40 MG tablet TAKE ONE TABLET BY MOUTH daily AS NEEDED (Patient taking differently: Take 40 mg by mouth daily as needed for fluid.)    gabapentin (NEURONTIN) 300 MG capsule TAKE TWO CAPSULES BY MOUTH EVERY MORNING and TAKE TWO CAPSULES BY MOUTH EVERYDAY AT BEDTIME (Patient taking differently: Take 600 mg by mouth 2 (two) times daily.)    Galcanezumab-gnlm (EMGALITY) 120 MG/ML SOAJ Inject 120 mg into the skin every 30 (thirty) days. (Patient not taking: Reported on 10/15/2021)    hydrALAZINE (APRESOLINE) 50 MG tablet TAKE TWO TABLETS BY MOUTH EVERY MORNING and TAKE TWO TABLETS BY MOUTH AT NOON and TAKE TWO TABLETS BY MOUTH EVERYDAY AT BEDTIME (Patient taking differently: Take 100 mg by mouth 3 (three) times daily.)    Insulin Pen Needle 32G X 4 MM MISC Use to inject insulin once a day.    Insulin Syringe-Needle U-100 (BD INSULIN SYRINGE ULTRAFINE) 31G X 15/64" 1 ML MISC Used to give daily  insulin injections.    ketoconazole (NIZORAL) 2 % shampoo Apply 1 application topically 2 (two) times a week. Wash off after each use (Patient taking differently: Apply 1 application topically 2 (two) times a week. Wash off after each use. No set days)    levETIRAcetam (KEPPRA) 250 MG tablet Take 1 tablet (250 mg total) by mouth every Monday, Wednesday, and Friday at 6 PM. Please take along with Keppra 500 mg p.o. daily on the days of dialysis, after dialysis.    levETIRAcetam (KEPPRA) 500 MG tablet Take 1 tablet (500 mg total) by mouth daily.    levothyroxine (SYNTHROID) 175 MCG tablet Take 175 mcg by mouth every morning.    losartan (COZAAR) 50 MG tablet Take 1 tablet (50 mg total) by mouth daily.    multivitamin (RENA-VIT) TABS tablet Take 1 tablet by mouth at bedtime.    NOVOLOG 100 UNIT/ML injection USE AS DIRECTED in insulin pump (Patient taking differently: Inject 0-6 Units into the skin See  admin instructions. 4-5 times daily per sliding scale)    ondansetron (ZOFRAN ODT) 4 MG disintegrating tablet Take 1 tablet (4 mg total) by mouth every 8 (eight) hours as needed for nausea or vomiting.    pantoprazole (PROTONIX) 40 MG tablet Take 40 mg by mouth daily.    pravastatin (PRAVACHOL) 20 MG tablet TAKE ONE TABLET BY MOUTH EVERYDAY AT BEDTIME (Patient taking differently: Take 20 mg by mouth at bedtime.)    Rimegepant Sulfate (NURTEC) 75 MG TBDP Take 75 mg by mouth daily as needed. For migraines. Take as close to onset of migraine as possible. One daily maximum.    TRESIBA FLEXTOUCH 100 UNIT/ML FlexTouch Pen Inject 12 Units into the skin daily as needed (high blood sugar).    [DISCONTINUED] metoCLOPramide (REGLAN) 5 MG tablet Take 1 tablet (5 mg total) by mouth 3 (three) times daily as needed for nausea or vomiting. (Patient not taking: Reported on 06/25/2020)    No facility-administered encounter medications on file as of 10/21/2021.      Observations/Objective: No direct observation done as this  was a telephone visit.  Lab Results  Component Value Date   WBC 6.2 10/19/2021   HGB 11.6 (L) 10/19/2021   HCT 37.3 10/19/2021   MCV 92.6 10/19/2021   PLT 123 (L) 10/19/2021     Chemistry      Component Value Date/Time   NA 128 (L) 10/19/2021 0631   NA 136 12/28/2020 1432   K 4.4 10/19/2021 0631   CL 93 (L) 10/19/2021 0631   CO2 23 10/19/2021 0631   BUN 27 (H) 10/19/2021 0631   BUN 42 (H) 12/28/2020 1432   CREATININE 8.83 (H) 10/19/2021 0631   CREATININE 2.31 (H) 02/04/2016 1703      Component Value Date/Time   CALCIUM 9.5 10/19/2021 0631   ALKPHOS 141 (H) 10/15/2021 1721   AST 31 10/15/2021 1721   ALT 15 10/15/2021 1721   BILITOT 0.9 10/15/2021 1721   BILITOT <0.2 12/28/2020 1432     Lab Results  Component Value Date   HGBA1C 8.3 (H) 10/15/2021    Assessment and Plan: 1. Hospital discharge follow-up   2. Seizure disorder [facility]) Patient to continue Keppra as prescribed. I will get her in with a neurologist.  Referral submitted. Reminded that she should not drive until she is seizure-free for at least 6 months. - Ambulatory referral to Neurology - levETIRAcetam (KEPPRA) 500 MG tablet; Take 1 tablet (500 mg total) by mouth daily.  Dispense: 90 tablet; Refill: 1 - levETIRAcetam (KEPPRA) 250 MG tablet; Take 1 tablet (250 mg total) by mouth every Monday, Wednesday, and Friday at 6 PM. Please take along with Keppra 500 mg p.o. daily on the days of dialysis, after dialysis.  Dispense: 36 tablet; Refill: 1  3. 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, with long-term current use of insulin ([place]) Strongly encouraged her to call the endocrinologist today to schedule appointment so that her insulin pump can be calibrated and put back in place.  In the meantime she will continue NovoLog sliding scale and Tresiba 20 units daily.  Healthy eating habits encouraged.  4. Hypertensive kidney disease with ESRD on dialysis ([place]) Strongly advised compliance with medications. -  For home use only DME Other see comment  5. Anemia in chronic kidney disease, on chronic dialysis (HCC) Stable.  6. Chest pain in adult - Ambulatory referral to Cardiology  7. Pain of right lower extremity Advised patient that I will send a message to  scheduling to have her scheduled for an in person follow-up visit with me.  Strongly encouraged her to keep the appointment   Follow Up Instructions: 2-3 wks   I discussed the assessment and treatment plan with the patient. The patient was provided an opportunity to ask questions and all were answered. The patient agreed with the plan and demonstrated an understanding of the instructions.   The patient was advised to call back or seek an in-person evaluation if the symptoms worsen or if the condition fails to improve as anticipated.  I  Spent 21 minutes on this telephone encounter  Karle Plumber, MD

## 2021-10-22 DIAGNOSIS — D631 Anemia in chronic kidney disease: Secondary | ICD-10-CM | POA: Diagnosis not present

## 2021-10-22 DIAGNOSIS — D509 Iron deficiency anemia, unspecified: Secondary | ICD-10-CM | POA: Diagnosis not present

## 2021-10-22 DIAGNOSIS — N186 End stage renal disease: Secondary | ICD-10-CM | POA: Diagnosis not present

## 2021-10-22 DIAGNOSIS — N2581 Secondary hyperparathyroidism of renal origin: Secondary | ICD-10-CM | POA: Diagnosis not present

## 2021-10-22 NOTE — Telephone Encounter (Signed)
Transition Care Management Follow-up Telephone Call Date of discharge and from where: 10/19/2021 from [facility] How have you been since you were released from the hospital? Pt stated that she is feeling tired but otherwise she is feeling well.  Any questions or concerns? No  Items Reviewed: Did the pt receive and understand the discharge instructions provided? Yes  Medications obtained and verified? Yes  Other? No  Any new allergies since your discharge? No  Dietary orders reviewed? No Do you have support at home? Yes   Functional Questionnaire: (I = Independent and D = Dependent) ADLs: I  Bathing/Dressing- I  Meal Prep- I  Eating- I  Maintaining continence- I  Transferring/Ambulation- I  Managing Meds- I   Follow up appointments reviewed:  PCP Hospital f/u appt confirmed? Yes  PCP video visit yesterday 11.3.22 [facility] f/u appt confirmed? No   Are transportation arrangements needed? No  If their condition worsens, is the pt aware to call PCP or go to the Emergency Dept.? Yes Was the patient provided with contact information for the PCP's office or ED? Yes Was to pt encouraged to call back with questions or concerns? Yes

## 2021-10-25 ENCOUNTER — Ambulatory Visit: Payer: Medicaid Other | Admitting: Physician Assistant

## 2021-10-25 DIAGNOSIS — D631 Anemia in chronic kidney disease: Secondary | ICD-10-CM | POA: Diagnosis not present

## 2021-10-25 DIAGNOSIS — N186 End stage renal disease: Secondary | ICD-10-CM | POA: Diagnosis not present

## 2021-10-25 DIAGNOSIS — N2581 Secondary hyperparathyroidism of renal origin: Secondary | ICD-10-CM | POA: Diagnosis not present

## 2021-10-25 DIAGNOSIS — D509 Iron deficiency anemia, unspecified: Secondary | ICD-10-CM | POA: Diagnosis not present

## 2021-10-27 DIAGNOSIS — N186 End stage renal disease: Secondary | ICD-10-CM | POA: Diagnosis not present

## 2021-10-27 DIAGNOSIS — N2581 Secondary hyperparathyroidism of renal origin: Secondary | ICD-10-CM | POA: Diagnosis not present

## 2021-10-27 DIAGNOSIS — D509 Iron deficiency anemia, unspecified: Secondary | ICD-10-CM | POA: Diagnosis not present

## 2021-10-27 DIAGNOSIS — D631 Anemia in chronic kidney disease: Secondary | ICD-10-CM | POA: Diagnosis not present

## 2021-10-28 ENCOUNTER — Ambulatory Visit: Payer: Medicaid Other | Admitting: Internal Medicine
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
## 2021-10-29 DIAGNOSIS — D509 Iron deficiency anemia, unspecified: Secondary | ICD-10-CM | POA: Diagnosis not present

## 2021-10-29 DIAGNOSIS — N2581 Secondary hyperparathyroidism of renal origin: Secondary | ICD-10-CM | POA: Diagnosis not present

## 2021-10-29 DIAGNOSIS — D631 Anemia in chronic kidney disease: Secondary | ICD-10-CM | POA: Diagnosis not present

## 2021-10-29 DIAGNOSIS — N186 End stage renal disease: Secondary | ICD-10-CM | POA: Diagnosis not present

## 2021-10-31 ENCOUNTER — Other Ambulatory Visit: Payer: Self-pay | Admitting: Internal Medicine

## 2021-10-31 DIAGNOSIS — E1029 Type 1 diabetes mellitus with other diabetic kidney complication: Secondary | ICD-10-CM

## 2021-10-31 NOTE — Telephone Encounter (Signed)
Requested medication (s) are due for refill today: YES  Requested medication (s) are on the active medication list: YES  Last refill:  09/24/21  Future visit scheduled: NO  Notes to clinic:  per notes, pt hasnt been wearing insulin pump, to f/up with endocrinology.      Requested Prescriptions  Pending Prescriptions Disp Refills   NOVOLOG 100 UNIT/ML injection [Pharmacy Med Name: Novolog U-100 Insulin aspart 100 unit/mL subcutaneous solution] 10 mL 1    Sig: USE AS DIRECTED in insulin pump     Endocrinology:  Diabetes - Insulins Failed - 10/31/2021  8:01 AM      Failed - HBA1C is between 0 and 7.9 and within 180 days    HbA1c, POC (controlled diabetic range)  Date Value Ref Range Status  12/28/2020 10.2 (A) 0.0 - 7.0 % Final   Hgb A1c MFr Bld  Date Value Ref Range Status  10/15/2021 8.3 (H) 4.8 - 5.6 % Final    Comment:    (NOTE) Pre diabetes:          5.7%-6.4%  Diabetes:              >6.4%  Glycemic control for   <7.0% adults with diabetes           Passed - Valid encounter within last 6 months    Recent Outpatient Visits           1 week ago Hospital discharge follow-up   [place], Deborah B, MD   2 months ago Bronchitis with asthma, acute   [place], Deborah B, MD   7 months ago [place], [place], [place]   10 months ago Other insomnia   [place], Neoma Laming B, MD   11 months ago 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, with long-term current use of insulin [facility])   [facility], [name], MD

## 2021-11-01 DIAGNOSIS — D509 Iron deficiency anemia, unspecified: Secondary | ICD-10-CM | POA: Diagnosis not present

## 2021-11-01 DIAGNOSIS — D631 Anemia in chronic kidney disease: Secondary | ICD-10-CM | POA: Diagnosis not present

## 2021-11-01 DIAGNOSIS — N186 End stage renal disease: Secondary | ICD-10-CM | POA: Diagnosis not present

## 2021-11-01 DIAGNOSIS — N2581 Secondary hyperparathyroidism of renal origin: Secondary | ICD-10-CM | POA: Diagnosis not present

## 2021-11-03 ENCOUNTER — Encounter: Payer: Self-pay | Admitting: Internal Medicine

## 2021-11-03 DIAGNOSIS — N2581 Secondary hyperparathyroidism of renal origin: Secondary | ICD-10-CM | POA: Diagnosis not present

## 2021-11-03 DIAGNOSIS — N186 End stage renal disease: Secondary | ICD-10-CM | POA: Diagnosis not present

## 2021-11-03 DIAGNOSIS — D509 Iron deficiency anemia, unspecified: Secondary | ICD-10-CM | POA: Diagnosis not present

## 2021-11-03 DIAGNOSIS — D631 Anemia in chronic kidney disease: Secondary | ICD-10-CM | POA: Diagnosis not present

## 2021-11-05 DIAGNOSIS — D631 Anemia in chronic kidney disease: Secondary | ICD-10-CM | POA: Diagnosis not present

## 2021-11-05 DIAGNOSIS — N2581 Secondary hyperparathyroidism of renal origin: Secondary | ICD-10-CM | POA: Diagnosis not present

## 2021-11-05 DIAGNOSIS — N186 End stage renal disease: Secondary | ICD-10-CM | POA: Diagnosis not present

## 2021-11-05 DIAGNOSIS — D509 Iron deficiency anemia, unspecified: Secondary | ICD-10-CM | POA: Diagnosis not present

## 2021-11-07 DIAGNOSIS — D509 Iron deficiency anemia, unspecified: Secondary | ICD-10-CM | POA: Diagnosis not present

## 2021-11-07 DIAGNOSIS — N186 End stage renal disease: Secondary | ICD-10-CM | POA: Diagnosis not present

## 2021-11-07 DIAGNOSIS — N2581 Secondary hyperparathyroidism of renal origin: Secondary | ICD-10-CM | POA: Diagnosis not present

## 2021-11-07 DIAGNOSIS — I1311 Hypertensive heart and chronic kidney disease without heart failure, with stage 5 chronic kidney disease, or end stage renal disease: Secondary | ICD-10-CM | POA: Diagnosis not present

## 2021-11-07 DIAGNOSIS — D631 Anemia in chronic kidney disease: Secondary | ICD-10-CM | POA: Diagnosis not present

## 2021-11-09 DIAGNOSIS — N186 End stage renal disease: Secondary | ICD-10-CM | POA: Diagnosis not present

## 2021-11-09 DIAGNOSIS — D631 Anemia in chronic kidney disease: Secondary | ICD-10-CM | POA: Diagnosis not present

## 2021-11-09 DIAGNOSIS — D509 Iron deficiency anemia, unspecified: Secondary | ICD-10-CM | POA: Diagnosis not present

## 2021-11-09 DIAGNOSIS — N2581 Secondary hyperparathyroidism of renal origin: Secondary | ICD-10-CM | POA: Diagnosis not present

## 2021-11-10 DIAGNOSIS — N186 End stage renal disease: Secondary | ICD-10-CM | POA: Diagnosis not present

## 2021-11-10 DIAGNOSIS — Z992 Dependence on renal dialysis: Secondary | ICD-10-CM | POA: Diagnosis not present

## 2021-11-10 DIAGNOSIS — Z452 Encounter for adjustment and management of vascular access device: Secondary | ICD-10-CM | POA: Diagnosis not present

## 2021-11-12 DIAGNOSIS — N2581 Secondary hyperparathyroidism of renal origin: Secondary | ICD-10-CM | POA: Diagnosis not present

## 2021-11-12 DIAGNOSIS — D631 Anemia in chronic kidney disease: Secondary | ICD-10-CM | POA: Diagnosis not present

## 2021-11-12 DIAGNOSIS — N186 End stage renal disease: Secondary | ICD-10-CM | POA: Diagnosis not present

## 2021-11-12 DIAGNOSIS — D509 Iron deficiency anemia, unspecified: Secondary | ICD-10-CM | POA: Diagnosis not present

## 2021-11-15 ENCOUNTER — Other Ambulatory Visit: Payer: Self-pay | Admitting: Internal Medicine

## 2021-11-15 DIAGNOSIS — F418 Other specified anxiety disorders: Secondary | ICD-10-CM

## 2021-11-15 DIAGNOSIS — N186 End stage renal disease: Secondary | ICD-10-CM | POA: Diagnosis not present

## 2021-11-15 DIAGNOSIS — D631 Anemia in chronic kidney disease: Secondary | ICD-10-CM | POA: Diagnosis not present

## 2021-11-15 DIAGNOSIS — D509 Iron deficiency anemia, unspecified: Secondary | ICD-10-CM | POA: Diagnosis not present

## 2021-11-15 DIAGNOSIS — F331 Major depressive disorder, recurrent, moderate: Secondary | ICD-10-CM

## 2021-11-15 DIAGNOSIS — N2581 Secondary hyperparathyroidism of renal origin: Secondary | ICD-10-CM | POA: Diagnosis not present

## 2021-11-15 NOTE — Telephone Encounter (Signed)
Will forward to provider  

## 2021-11-17 DIAGNOSIS — N186 End stage renal disease: Secondary | ICD-10-CM | POA: Diagnosis not present

## 2021-11-17 DIAGNOSIS — D631 Anemia in chronic kidney disease: Secondary | ICD-10-CM | POA: Diagnosis not present

## 2021-11-17 DIAGNOSIS — N2581 Secondary hyperparathyroidism of renal origin: Secondary | ICD-10-CM | POA: Diagnosis not present

## 2021-11-17 DIAGNOSIS — Z992 Dependence on renal dialysis: Secondary | ICD-10-CM | POA: Diagnosis not present

## 2021-11-17 DIAGNOSIS — D509 Iron deficiency anemia, unspecified: Secondary | ICD-10-CM | POA: Diagnosis not present

## 2021-11-18 DIAGNOSIS — N186 End stage renal disease: Secondary | ICD-10-CM | POA: Diagnosis not present

## 2021-11-18 DIAGNOSIS — E8779 Other fluid overload: Secondary | ICD-10-CM | POA: Diagnosis not present

## 2021-11-18 DIAGNOSIS — D631 Anemia in chronic kidney disease: Secondary | ICD-10-CM | POA: Diagnosis not present

## 2021-11-18 DIAGNOSIS — N2581 Secondary hyperparathyroidism of renal origin: Secondary | ICD-10-CM | POA: Diagnosis not present

## 2021-11-19 DIAGNOSIS — E8779 Other fluid overload: Secondary | ICD-10-CM | POA: Diagnosis not present

## 2021-11-19 DIAGNOSIS — N2581 Secondary hyperparathyroidism of renal origin: Secondary | ICD-10-CM | POA: Diagnosis not present

## 2021-11-19 DIAGNOSIS — D631 Anemia in chronic kidney disease: Secondary | ICD-10-CM | POA: Diagnosis not present

## 2021-11-19 DIAGNOSIS — N186 End stage renal disease: Secondary | ICD-10-CM | POA: Diagnosis not present

## 2021-11-22 DIAGNOSIS — N186 End stage renal disease: Secondary | ICD-10-CM | POA: Diagnosis not present

## 2021-11-22 DIAGNOSIS — E8779 Other fluid overload: Secondary | ICD-10-CM | POA: Diagnosis not present

## 2021-11-22 DIAGNOSIS — N2581 Secondary hyperparathyroidism of renal origin: Secondary | ICD-10-CM | POA: Diagnosis not present

## 2021-11-22 DIAGNOSIS — D631 Anemia in chronic kidney disease: Secondary | ICD-10-CM | POA: Diagnosis not present

## 2021-11-23 ENCOUNTER — Telehealth: Payer: Self-pay | Admitting: Internal Medicine

## 2021-11-23 NOTE — Telephone Encounter (Signed)
-----   Message from [name], MD sent at 10/21/2021  1:33 PM EDT ----- Give f/u appt in-person in 3 wks

## 2021-11-23 NOTE — Telephone Encounter (Signed)
Left vm for patient to call [phone] to get f/u with Dr. Wynetta Emery

## 2021-11-24 DIAGNOSIS — E8779 Other fluid overload: Secondary | ICD-10-CM | POA: Diagnosis not present

## 2021-11-24 DIAGNOSIS — N2581 Secondary hyperparathyroidism of renal origin: Secondary | ICD-10-CM | POA: Diagnosis not present

## 2021-11-24 DIAGNOSIS — N186 End stage renal disease: Secondary | ICD-10-CM | POA: Diagnosis not present

## 2021-11-24 DIAGNOSIS — E039 Hypothyroidism, unspecified: Secondary | ICD-10-CM | POA: Diagnosis not present

## 2021-11-24 DIAGNOSIS — E1029 Type 1 diabetes mellitus with other diabetic kidney complication: Secondary | ICD-10-CM | POA: Diagnosis not present

## 2021-11-24 DIAGNOSIS — D631 Anemia in chronic kidney disease: Secondary | ICD-10-CM | POA: Diagnosis not present

## 2021-11-26 DIAGNOSIS — D631 Anemia in chronic kidney disease: Secondary | ICD-10-CM | POA: Diagnosis not present

## 2021-11-26 DIAGNOSIS — E8779 Other fluid overload: Secondary | ICD-10-CM | POA: Diagnosis not present

## 2021-11-26 DIAGNOSIS — N2581 Secondary hyperparathyroidism of renal origin: Secondary | ICD-10-CM | POA: Diagnosis not present

## 2021-11-26 DIAGNOSIS — N186 End stage renal disease: Secondary | ICD-10-CM | POA: Diagnosis not present

## 2021-11-29 DIAGNOSIS — N186 End stage renal disease: Secondary | ICD-10-CM | POA: Diagnosis not present

## 2021-11-29 DIAGNOSIS — E8779 Other fluid overload: Secondary | ICD-10-CM | POA: Diagnosis not present

## 2021-11-29 DIAGNOSIS — N2581 Secondary hyperparathyroidism of renal origin: Secondary | ICD-10-CM | POA: Diagnosis not present

## 2021-11-29 DIAGNOSIS — D631 Anemia in chronic kidney disease: Secondary | ICD-10-CM | POA: Diagnosis not present

## 2021-12-01 ENCOUNTER — Encounter: Payer: Self-pay | Admitting: Internal Medicine

## 2021-12-01 ENCOUNTER — Ambulatory Visit: Payer: Medicaid Other | Admitting: Internal Medicine

## 2021-12-01 ENCOUNTER — Other Ambulatory Visit: Payer: Self-pay

## 2021-12-01 VITALS — BP 148/80 | HR 80 | Ht 64.05 in | Wt 127.6 lb

## 2021-12-01 DIAGNOSIS — D631 Anemia in chronic kidney disease: Secondary | ICD-10-CM | POA: Diagnosis not present

## 2021-12-01 DIAGNOSIS — E8779 Other fluid overload: Secondary | ICD-10-CM | POA: Diagnosis not present

## 2021-12-01 DIAGNOSIS — N2581 Secondary hyperparathyroidism of renal origin: Secondary | ICD-10-CM | POA: Diagnosis not present

## 2021-12-01 DIAGNOSIS — N186 End stage renal disease: Secondary | ICD-10-CM | POA: Diagnosis not present

## 2021-12-01 DIAGNOSIS — I340 Nonrheumatic mitral (valve) insufficiency: Secondary | ICD-10-CM

## 2021-12-01 DIAGNOSIS — R079 Chest pain, unspecified: Secondary | ICD-10-CM | POA: Diagnosis not present

## 2021-12-01 MED ORDER — FUROSEMIDE 40 MG PO TABS
80.0000 mg | ORAL_TABLET | Freq: Every day | ORAL | 3 refills | Status: DC
Start: 2021-12-01 — End: 2023-02-04

## 2021-12-01 MED ORDER — LOSARTAN POTASSIUM 50 MG PO TABS
75.0000 mg | ORAL_TABLET | Freq: Every day | ORAL | 3 refills | Status: DC
Start: 2021-12-01 — End: 2023-02-16

## 2021-12-01 NOTE — Patient Instructions (Signed)
Medication Instructions:  START: LASIX (FUROSEMIDE) 2m ONCE DAILY  START: LOSARTAN 732mDAILY  *If you need a refill on your cardiac medications before your next appointment, please call your pharmacy*  Lab Work: ESR, CRP, AND BNP TODAY  If you have labs (blood work) drawn today and your tests are completely normal, you will receive your results only by: MyChart Message (if you have MyChart) OR A paper copy in the mail If you have any lab test that is abnormal or we need to change your treatment, we will call you to review the results.  Testing/Procedures: Your physician has requested that you have an echocardiogram. Echocardiography is a painless test that uses sound waves to create images of your heart. It provides your doctor with information about the size and shape of your heart and how well your hearts chambers and valves are working. You may receive an ultrasound enhancing agent through an IV if needed to better visualize your heart during the echo.This procedure takes approximately one hour. There are no restrictions for this procedure. This will take place at the [address]   Your physician has requested that you have en exercise stress myoview. For further information please visit [email or web address] follow instruction sheet, as given. This [address] take place [address]  Follow-Up: At [facility] and your health needs are our priority.  As part of our continuing mission to provide you with exceptional heart care, we have created designated Provider Care Teams.  These Care Teams include your primary Cardiologist (physician) and Advanced Practice Providers (APPs -  Physician Assistants and Nurse Practitioners) who all work together to provide you with the care you need, when you need it.  Your next appointment:   3 month(s)  The format for your next appointment:   In Person  Provider:   BrJanina MayoMD
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
## 2021-12-02 LAB — SEDIMENTATION RATE: Sed Rate: 15 mm/hr (ref 0–32)

## 2021-12-02 LAB — C-REACTIVE PROTEIN: CRP: 4 mg/L (ref 0–10)

## 2021-12-02 LAB — BRAIN NATRIURETIC PEPTIDE: BNP: 127.7 pg/mL — ABNORMAL HIGH (ref 0.0–100.0)

## 2021-12-03 DIAGNOSIS — D631 Anemia in chronic kidney disease: Secondary | ICD-10-CM | POA: Diagnosis not present

## 2021-12-03 DIAGNOSIS — N2581 Secondary hyperparathyroidism of renal origin: Secondary | ICD-10-CM | POA: Diagnosis not present

## 2021-12-03 DIAGNOSIS — E8779 Other fluid overload: Secondary | ICD-10-CM | POA: Diagnosis not present

## 2021-12-03 DIAGNOSIS — N186 End stage renal disease: Secondary | ICD-10-CM | POA: Diagnosis not present

## 2021-12-06 DIAGNOSIS — E8779 Other fluid overload: Secondary | ICD-10-CM | POA: Diagnosis not present

## 2021-12-06 DIAGNOSIS — Z79899 Other long term (current) drug therapy: Secondary | ICD-10-CM | POA: Diagnosis not present

## 2021-12-06 DIAGNOSIS — D631 Anemia in chronic kidney disease: Secondary | ICD-10-CM | POA: Diagnosis not present

## 2021-12-06 DIAGNOSIS — N2581 Secondary hyperparathyroidism of renal origin: Secondary | ICD-10-CM | POA: Diagnosis not present

## 2021-12-06 DIAGNOSIS — R569 Unspecified convulsions: Secondary | ICD-10-CM | POA: Diagnosis not present

## 2021-12-06 DIAGNOSIS — N186 End stage renal disease: Secondary | ICD-10-CM | POA: Diagnosis not present

## 2021-12-06 DIAGNOSIS — Z5181 Encounter for therapeutic drug level monitoring: Secondary | ICD-10-CM | POA: Diagnosis not present

## 2021-12-06 NOTE — Addendum Note (Signed)
Addended byPhineas Inches on: 12/06/2021 11:44 AM   Modules accepted: Orders

## 2021-12-06 NOTE — Addendum Note (Signed)
Addended by: Rexanne Mano B on: 12/06/2021 11:35 AM   Modules accepted: Orders

## 2021-12-08 DIAGNOSIS — E8779 Other fluid overload: Secondary | ICD-10-CM | POA: Diagnosis not present

## 2021-12-08 DIAGNOSIS — N2581 Secondary hyperparathyroidism of renal origin: Secondary | ICD-10-CM | POA: Diagnosis not present

## 2021-12-08 DIAGNOSIS — N186 End stage renal disease: Secondary | ICD-10-CM | POA: Diagnosis not present

## 2021-12-08 DIAGNOSIS — D631 Anemia in chronic kidney disease: Secondary | ICD-10-CM | POA: Diagnosis not present

## 2021-12-10 ENCOUNTER — Other Ambulatory Visit: Payer: Self-pay | Admitting: Internal Medicine

## 2021-12-10 ENCOUNTER — Encounter: Payer: Self-pay | Admitting: Internal Medicine

## 2021-12-10 DIAGNOSIS — N186 End stage renal disease: Secondary | ICD-10-CM | POA: Diagnosis not present

## 2021-12-10 DIAGNOSIS — E8779 Other fluid overload: Secondary | ICD-10-CM | POA: Diagnosis not present

## 2021-12-10 DIAGNOSIS — D631 Anemia in chronic kidney disease: Secondary | ICD-10-CM | POA: Diagnosis not present

## 2021-12-10 DIAGNOSIS — N2581 Secondary hyperparathyroidism of renal origin: Secondary | ICD-10-CM | POA: Diagnosis not present

## 2021-12-10 MED ORDER — ONDANSETRON 4 MG PO TBDP
4.0000 mg | ORAL_TABLET | Freq: Every day | ORAL | 2 refills | Status: DC | PRN
Start: 2021-12-10 — End: 2022-11-18

## 2021-12-10 MED ORDER — PANTOPRAZOLE SODIUM 40 MG PO TBEC: 40.0000 mg | DELAYED_RELEASE_TABLET | Freq: Every day | ORAL | 1 refills | Status: DC

## 2021-12-10 NOTE — Telephone Encounter (Signed)
Requested medication (s) are due for refill today:   Provider to review   Last ordered by another provider  Requested medication (s) are on the active medication list:   Yes  Future visit scheduled:   No   Last ordered: 12/10/2021 #20, 0 refills  Returned because it's a non delegated refill.   Requested Prescriptions  Pending Prescriptions Disp Refills   ondansetron (ZOFRAN-ODT) 4 MG disintegrating tablet [Pharmacy Med Name: ondansetron 4 mg disintegrating tablet] 30 tablet 0    Sig: DISSOLVE ONE TABLET UNDER THE TONGUE EVERY 8 HOURS AS NEEDED     Not Delegated - Gastroenterology: Antiemetics Failed - 12/10/2021  3:02 PM      Failed - This refill cannot be delegated      Passed - Valid encounter within last 6 months    Recent Outpatient Visits           1 month ago Hospital discharge follow-up   [place], MD   3 months ago Bronchitis with asthma, acute   [place], MD   8 months ago [place], [place], [place]   11 months ago Other insomnia   [place], Neoma Laming B, MD   1 year ago 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, with long-term current use of insulin ([place])   [place], MD       Future Appointments             In 2 months Branch, Royetta Crochet, MD [place] [facility], [facility]

## 2021-12-10 NOTE — Telephone Encounter (Signed)
Requested medication (s) are due for refill today: historical medication  Requested medication (s) are on the active medication list: yes  Last refill: protonix- 05/27/21, zofran - 10/18/#20 0 refills  Future visit scheduled: no  Notes to clinic:  historical medication, protonix, zofran not delegated per protocol     Requested Prescriptions  Pending Prescriptions Disp Refills   pantoprazole (PROTONIX) 40 MG tablet [Pharmacy Med Name: PANTOPRAZOLE 40MG  TABLETS] 30 tablet     Sig: TAKE 1 TABLET BY MOUTH EVERY DAY [place]     Gastroenterology: Proton Pump Inhibitors Passed - 12/10/2021  2:50 PM      Passed - Valid encounter within last 12 months    Recent Outpatient Visits           1 month ago Hospital discharge follow-up   [place] [name], MD   3 months ago Bronchitis with asthma, acute   [place] [name], MD   8 months ago [place], Levada Dy M, [place]   11 months ago Other insomnia   [place], Neoma Laming B, MD   1 year ago 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, with long-term current use of insulin ([place])   [place] Swords, [name], MD       Future Appointments             In 2 months Branch, Royetta Crochet, MD [facility]             ondansetron (ZOFRAN-ODT) 4 MG disintegrating tablet [Pharmacy Med Name: ONDANSETRON ODT 4MG  TABLETS] 20 tablet 0    Sig: DISSOLVE 1 TABLET(4 MG) ON THE TONGUE EVERY 8 HOURS AS NEEDED FOR NAUSEA OR VOMITING     Not Delegated - Gastroenterology: Antiemetics Failed - 12/10/2021  2:50 PM      Failed - This refill cannot be delegated      Passed - Valid encounter within last 6 months    Recent Outpatient Visits           1 month ago Hospital discharge follow-up   [place], Deborah B, MD   3 months ago Bronchitis with asthma, acute   [place], Deborah B, MD   8 months ago [place], [place], [place]   11 months ago Other insomnia   [place], Neoma Laming B, MD   1 year ago 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, with long-term current use of insulin ([place])   [place], MD       Future Appointments             In 2 months Branch, Royetta Crochet, MD [place] [facility]

## 2021-12-13 DIAGNOSIS — N2581 Secondary hyperparathyroidism of renal origin: Secondary | ICD-10-CM | POA: Diagnosis not present

## 2021-12-13 DIAGNOSIS — D631 Anemia in chronic kidney disease: Secondary | ICD-10-CM | POA: Diagnosis not present

## 2021-12-13 DIAGNOSIS — N186 End stage renal disease: Secondary | ICD-10-CM | POA: Diagnosis not present

## 2021-12-13 DIAGNOSIS — E8779 Other fluid overload: Secondary | ICD-10-CM | POA: Diagnosis not present

## 2021-12-15 DIAGNOSIS — D631 Anemia in chronic kidney disease: Secondary | ICD-10-CM | POA: Diagnosis not present

## 2021-12-15 DIAGNOSIS — E8779 Other fluid overload: Secondary | ICD-10-CM | POA: Diagnosis not present

## 2021-12-15 DIAGNOSIS — N186 End stage renal disease: Secondary | ICD-10-CM | POA: Diagnosis not present

## 2021-12-15 DIAGNOSIS — N2581 Secondary hyperparathyroidism of renal origin: Secondary | ICD-10-CM | POA: Diagnosis not present

## 2021-12-16 ENCOUNTER — Other Ambulatory Visit: Payer: Self-pay | Admitting: Internal Medicine

## 2021-12-16 ENCOUNTER — Telehealth (HOSPITAL_COMMUNITY): Payer: Self-pay | Admitting: *Deleted

## 2021-12-16 DIAGNOSIS — F331 Major depressive disorder, recurrent, moderate: Secondary | ICD-10-CM

## 2021-12-16 DIAGNOSIS — I10 Essential (primary) hypertension: Secondary | ICD-10-CM

## 2021-12-16 DIAGNOSIS — R569 Unspecified convulsions: Secondary | ICD-10-CM | POA: Diagnosis not present

## 2021-12-16 DIAGNOSIS — F418 Other specified anxiety disorders: Secondary | ICD-10-CM

## 2021-12-16 NOTE — Telephone Encounter (Signed)
Requested Prescriptions  Pending Prescriptions Disp Refills   escitalopram (LEXAPRO) 20 MG tablet [Pharmacy Med Name: escitalopram 20 mg tablet] 30 tablet 2    Sig: TAKE ONE TABLET BY MOUTH ONCE DAILY     Psychiatry:  Antidepressants - SSRI Passed - 12/16/2021  8:02 AM      Passed - Completed PHQ-2 or PHQ-9 in the last 360 days      Passed - Valid encounter within last 6 months    Recent Outpatient Visits          1 month ago Hospital discharge follow-up   [place], Deborah B, MD   3 months ago Bronchitis with asthma, acute   [place] [name], MD   8 months ago [place], Levada Dy M, [place]   11 months ago Other insomnia   [place], Neoma Laming B, MD   1 year ago 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, with long-term current use of insulin ([place])   [place], MD      Future Appointments            In 2 months Branch, Royetta Crochet, MD [facility]            amLODipine ([place]) 10 MG tablet [Pharmacy Med Name: amlodipine 10 mg tablet] 30 tablet 6    Sig: TAKE ONE TABLET BY MOUTH EVERY MORNING     Cardiovascular:  Calcium Channel Blockers Failed - 12/16/2021  8:02 AM      Failed - Last BP in normal range    BP Readings from Last 1 Encounters:  12/01/21 (!) 148/80         Passed - Valid encounter within last 6 months    Recent Outpatient Visits          1 month ago Hospital discharge follow-up   [place], Deborah B, MD   3 months ago Bronchitis with asthma, acute   [place], MD   8 months ago [place], [place], [place]   11 months ago Other insomnia   [place], Neoma Laming B, MD   1 year ago 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, with long-term current use of insulin ([place])   [place], MD      Future Appointments            In 2 months Branch, Royetta Crochet, MD [place] [facility]

## 2021-12-16 NOTE — Telephone Encounter (Signed)
Patient given detailed instructions per Myocardial Perfusion Study Information Sheet for the test on 12/23/2021 at 10:30. Patient notified to arrive 15 minutes early and that it is imperative to arrive on time for appointment to keep from having the test rescheduled.  If you need to cancel or reschedule your appointment, please call the office within 24 hours of your appointment. . Patient verbalized understanding.Angela [name]

## 2021-12-17 DIAGNOSIS — N2581 Secondary hyperparathyroidism of renal origin: Secondary | ICD-10-CM | POA: Diagnosis not present

## 2021-12-17 DIAGNOSIS — E8779 Other fluid overload: Secondary | ICD-10-CM | POA: Diagnosis not present

## 2021-12-17 DIAGNOSIS — D631 Anemia in chronic kidney disease: Secondary | ICD-10-CM | POA: Diagnosis not present

## 2021-12-17 DIAGNOSIS — N186 End stage renal disease: Secondary | ICD-10-CM | POA: Diagnosis not present

## 2021-12-18 DIAGNOSIS — N186 End stage renal disease: Secondary | ICD-10-CM | POA: Diagnosis not present

## 2021-12-18 DIAGNOSIS — Z992 Dependence on renal dialysis: Secondary | ICD-10-CM | POA: Diagnosis not present

## 2021-12-22 DIAGNOSIS — E785 Hyperlipidemia, unspecified: Secondary | ICD-10-CM | POA: Diagnosis not present

## 2021-12-22 DIAGNOSIS — E1029 Type 1 diabetes mellitus with other diabetic kidney complication: Secondary | ICD-10-CM | POA: Diagnosis not present

## 2021-12-22 DIAGNOSIS — E039 Hypothyroidism, unspecified: Secondary | ICD-10-CM | POA: Diagnosis not present

## 2021-12-22 DIAGNOSIS — E1022 Type 1 diabetes mellitus with diabetic chronic kidney disease: Secondary | ICD-10-CM | POA: Diagnosis not present

## 2021-12-23 ENCOUNTER — Ambulatory Visit (HOSPITAL_COMMUNITY): Payer: Medicaid Other | Attending: Cardiology

## 2021-12-23 ENCOUNTER — Ambulatory Visit (HOSPITAL_BASED_OUTPATIENT_CLINIC_OR_DEPARTMENT_OTHER): Payer: Medicaid Other

## 2021-12-23 ENCOUNTER — Other Ambulatory Visit: Payer: Self-pay

## 2021-12-23 DIAGNOSIS — I340 Nonrheumatic mitral (valve) insufficiency: Secondary | ICD-10-CM | POA: Diagnosis not present

## 2021-12-23 DIAGNOSIS — R079 Chest pain, unspecified: Secondary | ICD-10-CM | POA: Insufficient documentation

## 2021-12-23 LAB — MYOCARDIAL PERFUSION IMAGING
Base ST Depression (mm): 0 mm
LV dias vol: 107 mL (ref 46–106)
LV sys vol: 40 mL
Nuc Stress EF: 63 %
Peak HR: 111 {beats}/min
Rest HR: 85 {beats}/min
Rest Nuclear Isotope Dose: 10.8 mCi
SDS: 4
SRS: 2
SSS: 6
ST Depression (mm): 0 mm
Stress Nuclear Isotope Dose: 31.5 mCi
TID: 0.99

## 2021-12-23 LAB — ECHOCARDIOGRAM COMPLETE
Area-P 1/2: 3.77 cm2
S' Lateral: 2.2 cm

## 2021-12-23 MED ORDER — ADENOSINE (DIAGNOSTIC) 3 MG/ML IV SOLN: 0.5600 mg/kg | Freq: Once | INTRAVENOUS | Status: AC

## 2021-12-23 MED ORDER — TECHNETIUM TC 99M TETROFOSMIN IV KIT
10.8000 | PACK | Freq: Once | INTRAVENOUS | Status: AC | PRN
  Administered 2021-12-23: 10.8 via INTRAVENOUS
  Filled 2021-12-23: qty 11

## 2021-12-23 MED ORDER — ADENOSINE (DIAGNOSTIC) 3 MG/ML IV SOLN
0.5600 mg/kg | Freq: Once | INTRAVENOUS | Status: AC
  Administered 2021-12-23: 32.4 mg via INTRAVENOUS

## 2021-12-23 MED ORDER — TECHNETIUM TC 99M TETROFOSMIN IV KIT
31.5000 | PACK | Freq: Once | INTRAVENOUS | Status: AC | PRN
  Administered 2021-12-23: 31.5 via INTRAVENOUS
  Filled 2021-12-23: qty 32

## 2021-12-27 ENCOUNTER — Other Ambulatory Visit: Payer: Self-pay | Admitting: *Deleted

## 2021-12-27 ENCOUNTER — Ambulatory Visit: Payer: Self-pay

## 2021-12-27 ENCOUNTER — Other Ambulatory Visit: Payer: Self-pay | Admitting: Internal Medicine

## 2021-12-27 DIAGNOSIS — I10 Essential (primary) hypertension: Secondary | ICD-10-CM

## 2021-12-27 NOTE — Patient Instructions (Signed)
Visit Information  [name]  - as a part of your Medicaid benefit, you are eligible for care management and care coordination services at no cost or copay. I was unable to reach you by phone today but would be happy to help you with your health related needs. Please feel free to call me @ [phone].   A member of the Managed Medicaid care management team will reach out to you again over the next 14 days.   Lurena Joiner RN, BSN [place] RN Care Coordinator

## 2021-12-27 NOTE — Patient Outreach (Signed)
Care Coordination  12/27/2021  Earma Nicolaou 09-26-1986 [id number]   Medicaid Managed Care   Unsuccessful Outreach Note  12/27/2021 Name: Travis Purk MRN: [id number] DOB: 1986-12-14  Referred by: [name], MD Reason for referral : High Risk Managed Medicaid (Unsuccessful RNCM follow up telephone outreach)   An unsuccessful telephone outreach was attempted today. The patient was referred to the case management team for assistance with care management and care coordination.   Follow Up Plan: A HIPAA compliant phone message was left for the patient providing contact information and requesting a return call.   Lurena Joiner RN, BSN [place] RN Care Coordinator

## 2021-12-28 ENCOUNTER — Other Ambulatory Visit: Payer: Self-pay | Admitting: Internal Medicine

## 2021-12-28 DIAGNOSIS — Z992 Dependence on renal dialysis: Secondary | ICD-10-CM | POA: Diagnosis not present

## 2021-12-28 DIAGNOSIS — E1065 Type 1 diabetes mellitus with hyperglycemia: Secondary | ICD-10-CM | POA: Diagnosis not present

## 2021-12-28 DIAGNOSIS — E039 Hypothyroidism, unspecified: Secondary | ICD-10-CM | POA: Diagnosis not present

## 2021-12-28 DIAGNOSIS — E1043 Type 1 diabetes mellitus with diabetic autonomic (poly)neuropathy: Secondary | ICD-10-CM | POA: Diagnosis not present

## 2021-12-28 DIAGNOSIS — E1042 Type 1 diabetes mellitus with diabetic polyneuropathy: Secondary | ICD-10-CM | POA: Diagnosis not present

## 2021-12-28 DIAGNOSIS — Z9114 Patient's other noncompliance with medication regimen: Secondary | ICD-10-CM | POA: Diagnosis not present

## 2021-12-28 DIAGNOSIS — I10 Essential (primary) hypertension: Secondary | ICD-10-CM

## 2021-12-28 DIAGNOSIS — K3184 Gastroparesis: Secondary | ICD-10-CM | POA: Diagnosis not present

## 2021-12-28 DIAGNOSIS — Z79890 Hormone replacement therapy (postmenopausal): Secondary | ICD-10-CM | POA: Diagnosis not present

## 2021-12-28 DIAGNOSIS — E1022 Type 1 diabetes mellitus with diabetic chronic kidney disease: Secondary | ICD-10-CM | POA: Diagnosis not present

## 2021-12-28 DIAGNOSIS — N186 End stage renal disease: Secondary | ICD-10-CM | POA: Diagnosis not present

## 2021-12-28 NOTE — Telephone Encounter (Signed)
Requested Prescriptions  Pending Prescriptions Disp Refills   hydrALAZINE (APRESOLINE) 50 MG tablet [Pharmacy Med Name: hydralazine 50 mg tablet] 360 tablet 0    Sig: TAKE TWO TABLETS BY MOUTH EVERY MORNING and TAKE TWO TABLETS BY MOUTH AT NOON and TAKE TWO TABLETS BY MOUTH EVERYDAY AT BEDTIME     Cardiovascular:  Vasodilators Failed - 12/27/2021  4:30 PM      Failed - HGB in normal range and within 360 days    Hemoglobin  Date Value Ref Range Status  10/19/2021 11.6 (L) 12.0 - 15.0 g/dL Final  12/28/2020 8.0 (L) 11.1 - 15.9 g/dL Final         Failed - PLT in normal range and within 360 days    Platelets  Date Value Ref Range Status  10/19/2021 123 (L) 150 - 400 K/uL Final    Comment:    REPEATED TO VERIFY  12/28/2020 146 (L) 150 - 450 x10E3/uL Final         Failed - Last BP in normal range    BP Readings from Last 1 Encounters:  12/01/21 (!) 148/80         Passed - HCT in normal range and within 360 days    HCT  Date Value Ref Range Status  10/19/2021 37.3 36.0 - 46.0 % Final   Hematocrit  Date Value Ref Range Status  12/28/2020 25.5 (L) 34.0 - 46.6 % Final         Passed - RBC in normal range and within 360 days    RBC  Date Value Ref Range Status  10/19/2021 4.03 3.87 - 5.11 MIL/uL Final         Passed - WBC in normal range and within 360 days    WBC  Date Value Ref Range Status  10/19/2021 6.2 4.0 - 10.5 K/uL Final         Passed - Valid encounter within last 12 months    Recent Outpatient Visits          2 months ago Hospital discharge follow-up   [place], Deborah B, MD   4 months ago Bronchitis with asthma, acute   [place], Dalbert Batman, MD   9 months ago [place], [place], [place]   1 year ago Other insomnia   [place], Dalbert Batman, MD   1 year ago Type 1 diabetes mellitus with  kidney complication, with long-term current use of insulin ([place])   [place] Swords, [name], MD      Future Appointments            In 2 months Branch, Royetta Crochet, MD [facility]            furosemide (LASIX) 40 MG tablet [Pharmacy Med Name: furosemide 40 mg tablet] 60 tablet 0    Sig: TAKE ONE TABLET BY MOUTH ONCE daily AS NEEDED     Cardiovascular:  Diuretics - Loop Failed - 12/27/2021  4:30 PM      Failed - Na in normal range and within 360 days    Sodium  Date Value Ref Range Status  10/19/2021 128 (L) 135 - 145 mmol/L Final  12/28/2020 136 134 - 144 mmol/L Final         Failed - Cr in normal range and within 360 days  Creat  Date Value Ref Range Status  02/04/2016 2.31 (H) 0.50 - 1.10 mg/dL Final   Creatinine, Ser  Date Value Ref Range Status  10/19/2021 8.83 (H) 0.44 - 1.00 mg/dL Final   Creatinine, POC  Date Value Ref Range Status  06/23/2017 200 mg/dL Final   Creatinine, Urine  Date Value Ref Range Status  02/04/2016 120 20 - 320 mg/dL Final         Failed - Last BP in normal range    BP Readings from Last 1 Encounters:  12/01/21 (!) 148/80         Passed - K in normal range and within 360 days    Potassium  Date Value Ref Range Status  10/19/2021 4.4 3.5 - 5.1 mmol/L Final         Passed - Ca in normal range and within 360 days    Calcium  Date Value Ref Range Status  10/19/2021 9.5 8.9 - 10.3 mg/dL Final   Calcium, Ion  Date Value Ref Range Status  10/15/2021 1.06 (L) 1.15 - 1.40 mmol/L Final         Passed - Valid encounter within last 6 months    Recent Outpatient Visits          2 months ago Hospital discharge follow-up   [place], Deborah B, MD   4 months ago Bronchitis with asthma, acute   [place] [name], MD   9 months ago [place], [place],  [place]   1 year ago Other insomnia   [place], Neoma Laming B, MD   1 year ago 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, with long-term current use of insulin ([place])   [place], [name], MD      Future Appointments            In 2 months Branch, Royetta Crochet, MD [place] [facility]

## 2021-12-28 NOTE — Telephone Encounter (Signed)
Requested medications are due for refill today.  yes  Requested medications are on the active medications list.  yes  Last refill. 09/24/2021  Future visit scheduled.   no  Notes to clinic.  Failed protocol d/t abnormal labs.    Requested Prescriptions  Pending Prescriptions Disp Refills   hydrALAZINE (APRESOLINE) 50 MG tablet [Pharmacy Med Name: hydralazine 50 mg tablet] 360 tablet 0    Sig: TAKE TWO TABLETS BY MOUTH EVERY MORNING and TAKE TWO TABLETS BY MOUTH AT NOON and TAKE TWO TABLETS BY MOUTH EVERYDAY AT BEDTIME     Cardiovascular:  Vasodilators Failed - 12/28/2021  3:19 PM      Failed - HGB in normal range and within 360 days    Hemoglobin  Date Value Ref Range Status  10/19/2021 11.6 (L) 12.0 - 15.0 g/dL Final  12/28/2020 8.0 (L) 11.1 - 15.9 g/dL Final          Failed - PLT in normal range and within 360 days    Platelets  Date Value Ref Range Status  10/19/2021 123 (L) 150 - 400 K/uL Final    Comment:    REPEATED TO VERIFY  12/28/2020 146 (L) 150 - 450 x10E3/uL Final          Failed - Last BP in normal range    BP Readings from Last 1 Encounters:  12/01/21 (!) 148/80          Passed - HCT in normal range and within 360 days    HCT  Date Value Ref Range Status  10/19/2021 37.3 36.0 - 46.0 % Final   Hematocrit  Date Value Ref Range Status  12/28/2020 25.5 (L) 34.0 - 46.6 % Final          Passed - RBC in normal range and within 360 days    RBC  Date Value Ref Range Status  10/19/2021 4.03 3.87 - 5.11 MIL/uL Final          Passed - WBC in normal range and within 360 days    WBC  Date Value Ref Range Status  10/19/2021 6.2 4.0 - 10.5 K/uL Final          Passed - Valid encounter within last 12 months    Recent Outpatient Visits           2 months ago Hospital discharge follow-up   [place], Deborah B, MD   4 months ago Bronchitis with asthma, acute   [place],  Deborah B, MD   9 months ago [place], [place]   1 year ago Other insomnia   [place], Deborah B, MD   1 year ago 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

## 2022-01-03 DIAGNOSIS — I429 Cardiomyopathy, unspecified: Secondary | ICD-10-CM

## 2022-01-06 ENCOUNTER — Other Ambulatory Visit: Payer: Self-pay

## 2022-01-06 ENCOUNTER — Other Ambulatory Visit: Payer: Self-pay | Admitting: *Deleted

## 2022-01-06 DIAGNOSIS — Z599 Problem related to housing and economic circumstances, unspecified: Secondary | ICD-10-CM

## 2022-01-06 NOTE — Patient Outreach (Signed)
Medicaid Managed Care   Nurse Care Manager Note  01/06/2022 Name:  Angela [name] MRN:  [id number] DOB:  [birth year]  Angela [name] is an 36 y.o. year old female who is a primary patient of [name], MD.  The [facility] Managed Care Coordination team was consulted for assistance with:    DMI Seizure disorder  [name] was given information about Medicaid Managed Care Coordination team services today. Angela [name] Patient agreed to services and verbal consent obtained.  Engaged with patient by telephone for follow up visit in response to provider referral for case management and/or care coordination services.   Assessments/Interventions:  Review of past medical history, allergies, medications, health status, including review of consultants reports, laboratory and other test data, was performed as part of comprehensive evaluation and provision of chronic care management services.  SDOH (Social Determinants of Health) assessments and interventions performed: SDOH Interventions    Flowsheet Row Most Recent Value  SDOH Interventions   Housing Interventions Other (Comment)  [Care Guide referral for assistance with housing]  Transportation Interventions Intervention Not Indicated       Care Plan  Allergies  Allergen Reactions   Ferumoxytol Itching   Lisinopril Other (See Comments)    hyperkalemia   Sulfamethoxazole Hives and Itching   Trimethoprim Hives    Medications Reviewed Today     Reviewed by Melissa Montane, RN (Registered Nurse) on 01/06/22 at 40  Med List Status: <None>   Medication Order Taking? Sig Documenting Provider Last Dose Status Informant  albuterol (PROVENTIL HFA) 108 (90 Base) MCG/ACT inhaler [id number] Yes Inhale 2 puffs into the lungs every 6 (six) hours as needed for wheezing or shortness of breath. [name], MD Taking Active Self  amLODipine (NORVASC) 10 MG tablet [id number] Yes TAKE ONE TABLET BY MOUTH EVERY MORNING [name], MD Taking Active   benzonatate (TESSALON) 100 MG capsule [id number] No Take 1 capsule (100 mg total) by mouth 2 (two) times daily as needed for cough.  Patient not taking: Reported on 12/01/2021   [name], MD Not Taking Active Self  busPIRone (BUSPAR) 15 MG tablet [id number] Yes TAKE ONE TABLET BY MOUTH EVERYDAY AT BEDTIME [name], MD Taking Active   Continuous Blood Gluc Sensor (DEXCOM G6 SENSOR) MISC [id number] Yes APPLY 1 SENSOR EVERY 10 DAYS [provider] Taking Active Self           Med Note [name], Lorita Forinash A   Thu Jan 06, 2022  4:22 PM)    Continuous Blood Gluc Sensor (DEXCOM G6 SENSOR) MISC [id number] Yes SMARTSIG:1 Topical Every 10 Days [provider] Taking Active Self           Med Note Leonidas Romberg Jan 06, 2022  4:22 PM)    Continuous Blood Gluc Transmit ([place]) [place] [id number] Yes See admin instructions. [provider] Taking Active Self           Med Note (Isa Hitz A   Thu Jan 06, 2022  4:22 PM)    diltiazem (CARDIZEM CD) 240 MG 24 hr capsule [id number] Yes Take 240 mg by mouth daily. [provider] Taking Active Self  escitalopram (LEXAPRO) 20 MG tablet [id number] Yes TAKE ONE TABLET BY MOUTH ONCE DAILY [name], MD Taking Active   furosemide (LASIX) 40 MG tablet [id number] Yes Take 2 tablets (80 mg total) by mouth daily. Janina Mayo, MD Taking Active   gabapentin (NEURONTIN) 300  MG capsule [id number] Yes TAKE TWO CAPSULES BY MOUTH EVERY MORNING and TAKE TWO CAPSULES BY MOUTH EVERYDAY AT BEDTIME  Patient taking differently: Take 600 mg by mouth 2 (two) times daily.   [name], MD Taking Active Self  Galcanezumab-gnlm [facility]) 120 MG/ML [name] [id number] No Inject 120 mg into the skin every 30 (thirty) days.  Patient not taking: Reported on 12/01/2021   [name], MD Not Taking Active Self  hydrALAZINE (APRESOLINE) 50 MG tablet [id number] Yes TAKE TWO TABLETS BY MOUTH  EVERY MORNING and TAKE TWO TABLETS BY MOUTH AT NOON and TAKE TWO TABLETS BY MOUTH EVERYDAY AT BEDTIME [name], MD Taking Active   Insulin Pen Needle 32G X 4 MM MISC [id number] Yes Use to inject insulin once a day. [name], MD Taking Active Self  Insulin Syringe-Needle U-100 (BD INSULIN SYRINGE ULTRAFINE) 31G X 15/64" 1 ML MISC [id number] Yes Used to give daily insulin injections. [name], MD Taking Active   ketoconazole (NIZORAL) 2 % shampoo [id number] No Apply 1 application topically 2 (two) times a week. Wash off after each use  Patient not taking: Reported on 01/06/2022   [name], MD Not Taking Active Self  levETIRAcetam (KEPPRA) 250 MG tablet [id number] No Take 1 tablet (250 mg total) by mouth every Monday, Wednesday, and Friday at 6 PM. Please take along with Keppra 500 mg p.o. daily on the days of dialysis, after dialysis.  Patient not taking: Reported on 01/06/2022   [name], MD Not Taking Active            Med Note [name], Shatasia Cutshaw A   Thu Jan 06, 2022  4:26 PM) Needs to pick up refill  levETIRAcetam (KEPPRA) 500 MG tablet [id number] Yes Take 1 tablet (500 mg total) by mouth daily. [name], MD Taking Active   levothyroxine (SYNTHROID) 175 MCG tablet [id number] No Take 175 mcg by mouth every morning.  Patient not taking: Reported on 01/06/2022   [provider] Not Taking Active Self           Med Note (Ziair Penson A   Thu Jan 06, 2022  4:27 PM) Needs to pick up refill  losartan (COZAAR) 50 MG tablet [id number] Yes Take 1.5 tablets (75 mg total) by mouth daily. Janina Mayo, MD Taking Active    Patient not taking:   Discontinued 06/25/20 0424 multivitamin (RENA-VIT) TABS tablet [id number] Yes Take 1 tablet by mouth at bedtime. [provider] Taking Active Self  NOVOLOG 100 UNIT/ML injection [id number]  USE AS DIRECTED in insulin pump [name], MD  Active            Med Note (Zakk Borgen A   Thu Jan 06, 2022   4:29 PM) Sliding scale until insulin pump restarted  ondansetron (ZOFRAN ODT) 4 MG disintegrating tablet [id number] Yes Take 1 tablet (4 mg total) by mouth daily as needed for nausea or vomiting. [name], MD Taking Active   pantoprazole (PROTONIX) 40 MG tablet [id number] Yes Take 1 tablet (40 mg total) by mouth daily. [name], MD Taking Active   pravastatin (PRAVACHOL) 20 MG tablet [id number] Yes TAKE ONE TABLET BY MOUTH EVERYDAY AT BEDTIME  Patient taking differently: Take 20 mg by mouth at bedtime.   [name], MD Taking Active Self  Rimegepant Sulfate (NURTEC) 75 MG TBDP [id number] No Take 75 mg by mouth daily as needed. For migraines. Take as close to onset of  migraine as possible. One daily maximum.  Patient not taking: Reported on 12/01/2021   [name], MD Not Taking Active Self  TRESIBA FLEXTOUCH 100 UNIT/ML FlexTouch Pen [id number] Yes Inject 12 Units into the skin daily as needed (high blood sugar). [provider] Taking Active Self           Med Note (Tamsin Nader A   Thu Jan 06, 2022  4:30 PM) 10 units every night until insulin pump restarted            Patient Active Problem List   Diagnosis Date Noted   Seizure ([place]) 10/15/2021   ESRD on dialysis ([place]) 10/15/2021   Essential hypertension 10/15/2021   Type 1 diabetes mellitus with hyperosmolar hyperglycemic state (HHS) ([place]) 10/15/2021   Influenza vaccine needed 12/28/2020   Acute renal failure superimposed on stage 5 chronic kidney disease, not on chronic dialysis ([place]) 11/16/2020   Mallory-Weiss tear 11/16/2020   Hypertensive urgency 11/16/2020   Chronic migraine without aura without status migrainosus, not intractable 04/09/2020   Occipital neuralgia of right side 04/09/2020   MDD (major depressive disorder), recurrent, in full remission ([place]) 12/27/2019   Hyperosmolar hyperglycemic state (HHS) ([place]) 12/03/2019   Anxiety about health 10/29/2019   Cannabis abuse, episodic  10/29/2019   Abnormal uterine bleeding (AUB) 08/13/2019   Ovarian mass, left 11/27/2018   Moderate major depression ([place]) 11/27/2018   Right flank mass 05/03/2018   Secondary hyperparathyroidism ([place]) 05/01/2018   Anxiety and depression 06/23/2017   Insomnia 01/12/2016   Non compliance with medical treatment 01/05/2016   Mixed hyperlipidemia 01/05/2016   Chronic kidney disease (CKD) stage G3a/A3, moderately decreased glomerular filtration rate (GFR) between 45-59 mL/min/1.73 square meter and albuminuria creatinine ratio greater than 300 mg/g ([place]) 07/28/2015   Neurogenic bladder 02/20/2015   Diabetic peripheral neuropathy associated with type 1 diabetes mellitus ([place]) 02/20/2015   Iron deficiency 02/13/2015   Asthma 09/30/2014   Diabetic retinopathy ([place]) 09/30/2014   Atypical squamous cells of undetermined significance (ASCUS) on Papanicolaou smear of cervix 08/11/2014   Diabetic gastroparesis associated with type 1 diabetes mellitus ([place]) 08/05/2014   Anemia in chronic renal disease 08/05/2014   Hyperkalemia 08/04/2014   HTN (hypertension) 07/11/2012   GERD (gastroesophageal reflux disease) 09/26/2011   Hypothyroidism 09/14/2006   Uncontrolled type 1 diabetes mellitus with hypoglycemia, with long-term current use of insulin ([place]) 01/15/2000    Conditions to be addressed/monitored per PCP order:   DMI and seizure disorder  Care Plan : RN Care Manager Plan of Care  Updates made by Melissa Montane, RN since 01/06/2022 12:00 AM     Problem: CHL AMB "PATIENT-SPECIFIC PROBLEM"      Long-Range Goal: Development of Plan of Care to Address Health Management Needs Related to DMI and Seizure disorder   Start Date: 01/06/2022  Expected End Date: 05/06/2022  Priority: High  Note:   Current Barriers:  Chronic Disease Management support and education needs related to DMI and Seizure disorder Ms. Gannett has many upcoming provider appointments, she is aware of these and has transportation.  She is staying with her sister and is having a difficult time procuring her own housing and would like assistance. She would also like assistance finding a mental health provider.  RNCM Clinical Goal(s):  Patient will verbalize understanding of plan for management of DMI and Seizure disorder as evidenced by patient verbalization of self monitoring activities take all medications exactly as prescribed and will call provider for medication related questions as evidenced  by documentation in EMR    attend all scheduled medical appointments: as listed below as evidenced by provider documentation in EMR        work with pharmacist to address Medication procurement related to DMI and Seizure disorder as evidenced by review of EMR and patient or pharmacist report    work with Education officer, museum to address [place] Concerns  related to the management of DMI and seizure disorder as evidenced by review of EMR and patient or social worker report     work with Data processing manager care guide to address needs related to Housing barriers as evidenced by patient and/or community resource care guide support    through collaboration with Consulting civil engineer, provider, and care team.   Interventions: Inter-disciplinary care team collaboration (see longitudinal plan of care) Evaluation of current treatment plan related to  self management and patient's adherence to plan as established by provider RNCM will follow closely   Diabetes:  (Status: New goal.) Long Term Goal   Lab Results  Component Value Date   HGBA1C 8.3 (H) 10/15/2021   @ Assessed patient's understanding of A1c goal: <7% Provided education to patient about basic DM disease process; Reviewed medications with patient and discussed importance of medication adherence;        Discussed plans with patient for ongoing care management follow up and provided patient with direct contact information for care management team;      Reviewed scheduled/upcoming  provider appointments including: 1/24 for brain MRI, 2/6 with Diabetic Services, 2/13 for heart MRI, 2/27 with Endocrinology, 3/16 with Neurology and 3/21 with Cardiology;         Referral made to pharmacy team for assistance with medication management;       Referral made to social work team for assistance with establishing care with [place] Provider;      Referral made to community resources care guide team for assistance with housing and financial resources;      Review of patient status, including review of consultants reports, relevant laboratory and other test results, and medications completed;        Seizure Disorder  (Status: New goal.) Long Term Goal  Evaluation of current treatment plan related to  Seizure disorder ,  self-management and patient's adherence to plan as established by provider. Discussed plans with patient for ongoing care management follow up and provided patient with direct contact information for care management team Reviewed medications with patient and discussed the importance of taking Keppra as prescribed, discussed plan for patient to get refill; Provided patient with MyChart educational materials related to seizure disorder; Assessed social determinant of health barriers;   Patient Goals/Self-Care Activities: Take medications as prescribed   Attend all scheduled provider appointments Call pharmacy for medication refills 3-7 days in advance of running out of medications Call provider office for new concerns or questions  Work with the social worker to address care coordination needs and will continue to work with the clinical team to address health care and disease management related needs call the Suicide and Crisis Lifeline: 988 call 1-800-273-TALK (toll free, 24 hour hotline) go to [facility] [address] [place] [phone]) call 911 if experiencing a Mental Health or [place]  Attend  Diabetic Services appointment to restart insulin pump       Follow Up:  Patient agrees to Care Plan and Follow-up.  Plan: The Managed Medicaid care management team will reach out to the patient again  over the next 14 days.  Date/time of next scheduled RN care management/care coordination outreach:  01/20/22 @ 2:30pm  Lurena Joiner RN, BSN [place] RN Care Coordinator

## 2022-01-06 NOTE — Patient Instructions (Signed)
Visit Information  Ms. Volkov was given information about Medicaid Managed Care team care coordination services as a part of their Healthy [facility] Medicaid benefit. [name] verbally consented to engagement with the [facility] Managed Care team.   If you are experiencing a medical emergency, please call 911 or report to your local emergency department or urgent care.   If you have a non-emergency medical problem during routine business hours, please contact your provider's office and ask to speak with a nurse.   For questions related to your Healthy [facility] health plan, please call: [phone] or visit the homepage here: [email or web address]  If you would like to schedule transportation through your Healthy [facility] plan, please call the following number at least 2 days in advance of your appointment: [phone]  Call the [place] at [phone], at any time, 24 hours a day, 7 days a week. If you are in danger or need immediate medical attention call 911.  If you would like help to quit smoking, call 1-800-QUIT-NOW 8038673697) OR Espaol: 1-855-Djelo-Ya (7-680-881-1031) o para ms informacin haga clic aqu or Text READY to 200-400 to register via text  Ms. Jasmine Pang - following are the goals we discussed in your visit today:   Goals Addressed               This Visit's Progress     COMPLETED: "I want to take better care of myself" (pt-stated)        [place] Medicaid Managed Care (see longitudinal plan of care for additional care plan information)  Current Barriers:  Chronic Disease Management support and education needs related to Diabetes, Hypertension and Kidney disease. Patient reports that she would like to start taking better care of herself.  Non-adherence to prescribed medication regimen. Patient reports that she tries to take her medications as prescribed, however she often forgets to take  them. Needs help organizing her medications to help her remember to take them regularly.  Nurse Case Manager Clinical Goal(s):  Over the next 30 days, patient will verbalize understanding of plan for Diabetes, hypertension and renal disease-Met-Ms Amble has met with Nephrology and PCP in November, has Endocrinology appointment today. Over the next 30 days, patient will meet with RN Care Manager to address monitoring blood pressure and eating appropriately at home-Ms Seals has ordered BP cuff and is waiting for it to be delivered Over the next 60 days, patient will verbalize basic understanding of Diabetes, hypertension and renal disease process and self health management plan as evidenced by checking blood pressure at home, daily weights and eating a heart healthy, diabetic diet. Ms. Rettinger reports that she is eating better and has set a schedule for medication adherence. Recent ED visit due to N/V. She has Zofran now and this seems to be working much better. Over the next 30 days, patient will work with CM team pharmacist to review medications  Interventions:  Inter-disciplinary care team collaboration (see longitudinal plan of care) Evaluation of current treatment plan related to diabetes, hypertension and renal disease and patient's adherence to plan as established by provider. Advised patient to schedule appointments with PCP and endocrinology, create a [employer] account and request a medication kit with lock box and pill box, a BP monitor and scale. Provided education to patient re: heart healthy and diabetic diet Reviewed medications with patient and discussed taking medications as prescribed, setting alarms at certain times of the day medication adherence Reviewed scheduled/upcoming provider appointments including:11/23/20 Endocrinology, Nephrology in  March 2022,  Pharmacy referral for medication review Recommended patient to keep diabetic ice pops at home to have during episodes of nausea  to avoid dehydration and taking in a protein rich snack before bed to avoid low blood sugars during the night.  Plan:  Patient will continue to eat a heart healthy diabetic diet, begin to check BP and weight daily once she receives equipment, keep scheduled appointments. Communicate any new symptoms, concerns or questions to provider RNCM will send follow up on 12/25/20 @ 9am with a telephone visit   Please see past updates related to this goal by clicking on the "Past Updates" button in the selected goal  Resolving due to duplicate goal         Please see education materials related to diabetes and seizure disorder provided by MyChart link.  The patient has access to MyChart and can view provided education  Telephone follow up appointment with Managed Medicaid care management team member scheduled for:01/20/22 @ 2:30pm  Lurena Joiner RN, BSN [place] RN Care Coordinator   Following is a copy of your plan of care:  Care Plan : RN Care Manager Plan of Care  Updates made by Melissa Montane, RN since 01/06/2022 12:00 AM     Problem: [facility] AMB "PATIENT-SPECIFIC PROBLEM"      Long-Range Goal: Development of Plan of Care to Address Health Management Needs Related to DMI and Seizure disorder   Start Date: 01/06/2022  Expected End Date: 05/06/2022  Priority: High  Note:   Current Barriers:  Chronic Disease Management support and education needs related to DMI and Seizure disorder Ms. Joerger has many upcoming provider appointments, she is aware of these and has transportation. She is staying with her sister and is having a difficult time procuring her own housing and would like assistance. She would also like assistance finding a mental health provider.  RNCM Clinical Goal(s):  Patient will verbalize understanding of plan for management of DMI and Seizure disorder as evidenced by patient verbalization of self monitoring activities take all medications exactly  as prescribed and will call provider for medication related questions as evidenced by documentation in EMR    attend all scheduled medical appointments: as listed below as evidenced by provider documentation in EMR        work with pharmacist to address Medication procurement related to DMI and Seizure disorder as evidenced by review of EMR and patient or pharmacist report    work with Education officer, museum to address [place] Concerns  related to the management of DMI and seizure disorder as evidenced by review of EMR and patient or social worker report     work with Data processing manager care guide to address needs related to Housing barriers as evidenced by patient and/or community resource care guide support    through collaboration with Consulting civil engineer, provider, and care team.   Interventions: Inter-disciplinary care team collaboration (see longitudinal plan of care) Evaluation of current treatment plan related to  self management and patient's adherence to plan as established by provider RNCM will follow closely   Diabetes:  (Status: New goal.) Long Term Goal   Lab Results  Component Value Date   HGBA1C 8.3 (H) 10/15/2021   @ Assessed patient's understanding of A1c goal: <7% Provided education to patient about basic DM disease process; Reviewed medications with patient and discussed importance of medication adherence;        Discussed plans with patient for ongoing  care management follow up and provided patient with direct contact information for care management team;      Reviewed scheduled/upcoming provider appointments including: 1/24 for brain MRI, 2/6 with Diabetic Services, 2/13 for heart MRI, 2/27 with Endocrinology, 3/16 with Neurology and 3/21 with Cardiology;         Referral made to pharmacy team for assistance with medication management;       Referral made to social work team for assistance with establishing care with [place] Provider;      Referral made to community  resources care guide team for assistance with housing and financial resources;      Review of patient status, including review of consultants reports, relevant laboratory and other test results, and medications completed;        Seizure Disorder  (Status: New goal.) Long Term Goal  Evaluation of current treatment plan related to  Seizure disorder ,  self-management and patient's adherence to plan as established by provider. Discussed plans with patient for ongoing care management follow up and provided patient with direct contact information for care management team Reviewed medications with patient and discussed the importance of taking Keppra as prescribed, discussed plan for patient to get refill; Provided patient with MyChart educational materials related to seizure disorder; Assessed social determinant of health barriers;   Patient Goals/Self-Care Activities: Take medications as prescribed   Attend all scheduled provider appointments Call pharmacy for medication refills 3-7 days in advance of running out of medications Call provider office for new concerns or questions  Work with the social worker to address care coordination needs and will continue to work with the clinical team to address health care and disease management related needs call the Suicide and Crisis Lifeline: 988 call 1-800-273-TALK (toll free, 24 hour hotline) go to [facility] [address], [place] [phone]) call 911 if experiencing a Mental Health or [place]  Attend Diabetic Services appointment to restart insulin pump

## 2022-01-10 NOTE — Telephone Encounter (Signed)
SW MM Initial outreach 01/24/22 @900AM   [place], Embedded Care Coordination, High Risk Manage Medicaid  [place] Management  Direct Dial: [phone]

## 2022-01-11 DIAGNOSIS — R569 Unspecified convulsions: Secondary | ICD-10-CM | POA: Diagnosis not present

## 2022-01-11 DIAGNOSIS — R410 Disorientation, unspecified: Secondary | ICD-10-CM | POA: Diagnosis not present

## 2022-01-12 DIAGNOSIS — Z1159 Encounter for screening for other viral diseases: Secondary | ICD-10-CM | POA: Diagnosis not present

## 2022-01-17 ENCOUNTER — Other Ambulatory Visit: Payer: Self-pay | Admitting: Internal Medicine

## 2022-01-17 DIAGNOSIS — E1042 Type 1 diabetes mellitus with diabetic polyneuropathy: Secondary | ICD-10-CM

## 2022-01-17 NOTE — Telephone Encounter (Signed)
Requested medication (s) are due for refill today:   Yes for both  Requested medication (s) are on the active medication list:   Yes for both  Future visit scheduled:   No   Last ordered: 07/26/2021 with 1 refill for both  Returned because labs due to protocol criteria not met.     Requested Prescriptions  Pending Prescriptions Disp Refills   gabapentin (NEURONTIN) 300 MG capsule [Pharmacy Med Name: gabapentin 300 mg capsule] 360 capsule 0    Sig: TAKE TWO CAPSULES BY MOUTH EVERY MORNING and TAKE TWO CAPSULES BY MOUTH EVERYDAY AT BEDTIME     Neurology: Anticonvulsants - gabapentin Passed - 01/17/2022  8:03 AM      Passed - Valid encounter within last 12 months    Recent Outpatient Visits           2 months ago Hospital discharge follow-up   [name], [place]   5 months ago Bronchitis with asthma, acute   [name], [place]   9 months ago [place], [place], [place]   1 year ago Other insomnia   [place], Dalbert Batman, MD   1 year ago 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, with long-term current use of insulin ([place])   [place], [name], MD       Future Appointments             In 1 month [place], Royetta Crochet, MD [facility]             pravastatin (PRAVACHOL) 20 MG tablet [Pharmacy Med Name: pravastatin 20 mg tablet] 90 tablet 0    Sig: TAKE ONE TABLET BY MOUTH EVERYDAY AT BEDTIME     Cardiovascular:  Antilipid - Statins Failed - 01/17/2022  8:03 AM      Failed - Total Cholesterol in normal range and within 360 days    Cholesterol, Total  Date Value Ref Range Status  12/28/2020 239 (H) 100 - 199 mg/dL Final          Failed - LDL in normal range and within 360 days    LDL Chol Calc (NIH)  Date Value Ref Range Status   12/28/2020 151 (H) 0 - 99 mg/dL Final          Failed - HDL in normal range and within 360 days    HDL  Date Value Ref Range Status  12/28/2020 72 >39 mg/dL Final          Failed - Triglycerides in normal range and within 360 days    Triglycerides  Date Value Ref Range Status  12/28/2020 93 0 - 149 mg/dL Final          Passed - Patient is not pregnant      Passed - Valid encounter within last 12 months    Recent Outpatient Visits           2 months ago Hospital discharge follow-up   [place], Deborah B, MD   5 months ago Bronchitis with asthma, acute   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

## 2022-01-18 DIAGNOSIS — Z992 Dependence on renal dialysis: Secondary | ICD-10-CM | POA: Diagnosis not present

## 2022-01-18 DIAGNOSIS — N186 End stage renal disease: Secondary | ICD-10-CM | POA: Diagnosis not present

## 2022-01-19 DIAGNOSIS — E1029 Type 1 diabetes mellitus with other diabetic kidney complication: Secondary | ICD-10-CM | POA: Diagnosis not present

## 2022-01-20 ENCOUNTER — Other Ambulatory Visit: Payer: Self-pay

## 2022-01-20 ENCOUNTER — Other Ambulatory Visit: Payer: Self-pay | Admitting: *Deleted

## 2022-01-20 NOTE — Patient Outreach (Signed)
Medicaid Managed Care   Nurse Care Manager Note  01/20/2022 Name:  Angela [name] MRN:  [id number] DOB:  21-May-1986  Angela [name] is an 36 y.o. year old female who is a primary patient of [name], MD.  The [facility] Managed Care Coordination team was consulted for assistance with:    DMI Seizure disorder  Ms. Korson was given information about Medicaid Managed Care Coordination team services today. [name] Patient agreed to services and verbal consent obtained.  Engaged with patient by telephone for follow up visit in response to provider referral for case management and/or care coordination services.   Assessments/Interventions:  Review of past medical history, allergies, medications, health status, including review of consultants reports, laboratory and other test data, was performed as part of comprehensive evaluation and provision of chronic care management services.  SDOH (Social Determinants of Health) assessments and interventions performed: SDOH Interventions    Flowsheet Row Most Recent Value  SDOH Interventions   Financial Strain Interventions Other (Comment)  [Care Guide referral placed]       Care Plan  Allergies  Allergen Reactions   Ferumoxytol Itching   Lisinopril Other (See Comments)    hyperkalemia   Sulfamethoxazole Hives and Itching   Trimethoprim Hives    Medications Reviewed Today     Reviewed by Melissa Montane, RN (Registered Nurse) on 01/20/22 at [place] List Status: <None>   Medication Order Taking? Sig Documenting Provider Last Dose Status Informant  albuterol (PROVENTIL HFA) 108 (90 Base) MCG/ACT inhaler [id number] Yes Inhale 2 puffs into the lungs every 6 (six) hours as needed for wheezing or shortness of breath. [name], MD Taking Active Self  amLODipine (NORVASC) 10 MG tablet [id number] Yes TAKE ONE TABLET BY MOUTH EVERY MORNING [name], MD Taking Active   benzonatate (TESSALON) 100 MG capsule [id number]  No Take 1 capsule (100 mg total) by mouth 2 (two) times daily as needed for cough.  Patient not taking: Reported on 12/01/2021   [name], MD Not Taking Active Self  busPIRone (BUSPAR) 15 MG tablet [id number] Yes TAKE ONE TABLET BY MOUTH EVERYDAY AT BEDTIME [name], MD Taking Active   Continuous Blood Gluc Sensor (DEXCOM G6 SENSOR) MISC [id number] Yes APPLY 1 SENSOR EVERY 10 DAYS [provider] Taking Active Self           Med Note [name], Juandavid Dallman A   Thu Jan 06, 2022  4:22 PM)    Continuous Blood Gluc Sensor (DEXCOM G6 SENSOR) MISC [id number] Yes SMARTSIG:1 Topical Every 10 Days [provider] Taking Active Self           Med Note Leonidas Romberg Jan 06, 2022  4:22 PM)    Continuous Blood Gluc Transmit ([place]) [place] [id number] Yes See admin instructions. [provider] Taking Active Self           Med Note (Jovonni Borquez A   Thu Jan 06, 2022  4:22 PM)    diltiazem (CARDIZEM CD) 240 MG 24 hr capsule [id number] Yes Take 240 mg by mouth daily. [provider] Taking Active Self  escitalopram (LEXAPRO) 20 MG tablet [id number] Yes TAKE ONE TABLET BY MOUTH ONCE DAILY [name], MD Taking Active   furosemide (LASIX) 40 MG tablet [id number] Yes Take 2 tablets (80 mg total) by mouth daily. Janina Mayo, MD Taking Active   gabapentin (NEURONTIN) 300 MG capsule [id number] Yes TAKE TWO CAPSULES BY  MOUTH EVERY MORNING and TAKE TWO CAPSULES BY MOUTH EVERYDAY AT BEDTIME  Patient taking differently: Take 600 mg by mouth 2 (two) times daily.   [name], MD Taking Active Self  Galcanezumab-gnlm [facility]) 120 MG/ML [name] [id number] No Inject 120 mg into the skin every 30 (thirty) days.  Patient not taking: Reported on 12/01/2021   [name], MD Not Taking Active Self  hydrALAZINE (APRESOLINE) 50 MG tablet [id number] Yes TAKE TWO TABLETS BY MOUTH EVERY MORNING and TAKE TWO TABLETS BY MOUTH AT NOON and TAKE TWO  TABLETS BY MOUTH EVERYDAY AT BEDTIME [name], MD Taking Active   Insulin Pen Needle 32G X 4 MM MISC [id number] Yes Use to inject insulin once a day. [name], MD Taking Active Self  Insulin Syringe-Needle U-100 (BD INSULIN SYRINGE ULTRAFINE) 31G X 15/64" 1 ML MISC [id number] Yes Used to give daily insulin injections. [name], MD Taking Active   ketoconazole (NIZORAL) 2 % shampoo [id number]  Apply 1 application topically 2 (two) times a week. Wash off after each use  Patient not taking: Reported on 01/06/2022   [name], MD  Active Self  levETIRAcetam (KEPPRA) 250 MG tablet [id number] Yes Take 1 tablet (250 mg total) by mouth every Monday, Wednesday, and Friday at 6 PM. Please take along with Keppra 500 mg p.o. daily on the days of dialysis, after dialysis. [name], MD Taking Active            Med Note [name], Rhapsody Wolven A   Thu Jan 20, 2022  2:59 PM)    levETIRAcetam (KEPPRA) 500 MG tablet [id number] Yes Take 1 tablet (500 mg total) by mouth daily. [name], MD Taking Active   levothyroxine (SYNTHROID) 175 MCG tablet [id number] Yes Take 175 mcg by mouth every morning. [provider] Taking Active Self           Med Note (Elianis Fischbach A   Thu Jan 20, 2022  2:59 PM)    losartan (COZAAR) 50 MG tablet [id number] Yes Take 1.5 tablets (75 mg total) by mouth daily. Janina Mayo, MD Taking Active    Patient not taking:   Discontinued 06/25/20 0424 multivitamin (RENA-VIT) TABS tablet [id number] Yes Take 1 tablet by mouth at bedtime. [provider] Taking Active Self  NOVOLOG 100 UNIT/ML injection [id number] Yes USE AS DIRECTED in insulin pump [name], MD Taking Active            Med Note (Gilmar Bua A   Thu Jan 06, 2022  4:29 PM) Sliding scale until insulin pump restarted  ondansetron (ZOFRAN ODT) 4 MG disintegrating tablet [id number] Yes Take 1 tablet (4 mg total) by mouth daily as needed for nausea or vomiting. [name], MD Taking Active   pantoprazole (PROTONIX) 40 MG tablet [id number] Yes Take 1 tablet (40 mg total) by mouth daily. [name], MD Taking Active   pravastatin (PRAVACHOL) 20 MG tablet [id number] Yes TAKE ONE TABLET BY MOUTH EVERYDAY AT BEDTIME  Patient taking differently: Take 20 mg by mouth at bedtime.   [name], MD Taking Active Self  Rimegepant Sulfate (NURTEC) 75 MG TBDP [id number] No Take 75 mg by mouth daily as needed. For migraines. Take as close to onset of migraine as possible. One daily maximum.  Patient not taking: Reported on 12/01/2021   [name], MD Not Taking Active Self  TRESIBA FLEXTOUCH 100 UNIT/ML FlexTouch Pen [id number] Yes Inject 12 Units  into the skin daily as needed (high blood sugar). [provider] Taking Active Self           Med Note (Kinjal Neitzke A   Thu Jan 06, 2022  4:30 PM) 10 units every night until insulin pump restarted            Patient Active Problem List   Diagnosis Date Noted   Seizure ([place]) 10/15/2021   ESRD on dialysis ([place]) 10/15/2021   Essential hypertension 10/15/2021   Type 1 diabetes mellitus with hyperosmolar hyperglycemic state (HHS) ([place]) 10/15/2021   Influenza vaccine needed 12/28/2020   Acute renal failure superimposed on stage 5 chronic kidney disease, not on chronic dialysis ([place]) 11/16/2020   Mallory-Weiss tear 11/16/2020   Hypertensive urgency 11/16/2020   Chronic migraine without aura without status migrainosus, not intractable 04/09/2020   Occipital neuralgia of right side 04/09/2020   MDD (major depressive disorder), recurrent, in full remission ([place]) 12/27/2019   Hyperosmolar hyperglycemic state (HHS) ([place]) 12/03/2019   Anxiety about health 10/29/2019   Cannabis abuse, episodic 10/29/2019   Abnormal uterine bleeding (AUB) 08/13/2019   Ovarian mass, left 11/27/2018   Moderate major depression ([place]) 11/27/2018   Right flank mass 05/03/2018   Secondary hyperparathyroidism ([place])  05/01/2018   Anxiety and depression 06/23/2017   Insomnia 01/12/2016   Non compliance with medical treatment 01/05/2016   Mixed hyperlipidemia 01/05/2016   Chronic kidney disease (CKD) stage G3a/A3, moderately decreased glomerular filtration rate (GFR) between 45-59 mL/min/1.73 square meter and albuminuria creatinine ratio greater than 300 mg/g ([place]) 07/28/2015   Neurogenic bladder 02/20/2015   Diabetic peripheral neuropathy associated with type 1 diabetes mellitus ([place]) 02/20/2015   Iron deficiency 02/13/2015   Asthma 09/30/2014   Diabetic retinopathy ([place]) 09/30/2014   Atypical squamous cells of undetermined significance (ASCUS) on Papanicolaou smear of cervix 08/11/2014   Diabetic gastroparesis associated with type 1 diabetes mellitus ([place]) 08/05/2014   Anemia in chronic renal disease 08/05/2014   Hyperkalemia 08/04/2014   HTN (hypertension) 07/11/2012   GERD (gastroesophageal reflux disease) 09/26/2011   Hypothyroidism 09/14/2006   Uncontrolled type 1 diabetes mellitus with hypoglycemia, with long-term current use of insulin ([place]) 01/15/2000    Conditions to be addressed/monitored per PCP order:   DMI and Seizure disorder  Care Plan : RN Care Manager Plan of Care  Updates made by Melissa Montane, RN since 01/20/2022 12:00 AM     Problem: CHL AMB "PATIENT-SPECIFIC PROBLEM"      Long-Range Goal: Development of Plan of Care to Address Health Management Needs Related to DMI and Seizure disorder   Start Date: 01/06/2022  Expected End Date: 05/06/2022  Priority: High  Note:   Current Barriers:  Chronic Disease Management support and education needs related to DMI and Seizure disorder Ms. Degrasse has all of her medications and taking as prescribed. She is wearing her Dexcom, reports readings in the mid 100's. Due to her living situation she is eating fast food 3 times weekly and drinks regular soda twice a day.   RNCM Clinical Goal(s):  Patient will verbalize understanding of plan for  management of DMI and Seizure disorder as evidenced by patient verbalization of self monitoring activities take all medications exactly as prescribed and will call provider for medication related questions as evidenced by documentation in EMR    attend all scheduled medical appointments: as listed below as evidenced by provider documentation in EMR        work with pharmacist to address Medication procurement related  to DMI and Seizure disorder as evidenced by review of EMR and patient or pharmacist report    work with Education officer, museum to address [place] Concerns  related to the management of DMI and seizure disorder as evidenced by review of EMR and patient or social worker report     work with Data processing manager care guide to address needs related to Housing barriers as evidenced by patient and/or community resource care guide support    through collaboration with Consulting civil engineer, provider, and care team.   Interventions: Inter-disciplinary care team collaboration (see longitudinal plan of care) Evaluation of current treatment plan related to  self management and patient's adherence to plan as established by provider Provided information for transportation provided by [employer] [phone] Discussed sleep hygiene, information provided for better sleep   Diabetes:  (Status: Goal on Track (progressing): YES.) Long Term Goal   Lab Results  Component Value Date   HGBA1C 8.3 (H) 10/15/2021   @ Assessed patient's understanding of A1c goal: <7% Reviewed medications with patient and discussed importance of medication adherence;        Reviewed prescribed diet with patient advised to cut back on fast food, and soda and to include more fruit and vegetables in her diet; Reviewed scheduled/upcoming provider appointments including: 2/6 with Diabetic Services, 2/13 for heart MRI, 2/27 with Endocrinology, 3/16 with Neurology and 3/21 with Cardiology;         call provider for findings outside  established parameters;       Review of patient status, including review of consultants reports, relevant laboratory and other test results, and medications completed;        Seizure Disorder  (Status: Goal on Track (progressing): YES.) Long Term Goal  Evaluation of current treatment plan related to  Seizure disorder ,  self-management and patient's adherence to plan as established by provider. Discussed plans with patient for ongoing care management follow up and provided patient with direct contact information for care management team Reviewed medications with patient and discussed the importance of taking as directed; Provided patient with MyChart educational materials related to seizure disorder; Assessed social determinant of health barriers;   Patient Goals/Self-Care Activities: Take medications as prescribed   Attend all scheduled provider appointments Call pharmacy for medication refills 3-7 days in advance of running out of medications Call provider office for new concerns or questions  Work with the social worker to address care coordination needs and will continue to work with the clinical team to address health care and disease management related needs call the Suicide and Crisis Lifeline: 988 call 1-800-273-TALK (toll free, 24 hour hotline) go to [facility] [address] [place] [phone]) call 911 if experiencing a Mental Health or [place]  Attend Diabetic Services appointment to restart insulin pump       Follow Up:  Patient agrees to Care Plan and Follow-up.  Plan: The Managed Medicaid care management team will reach out to the patient again over the next 30 days.  Date/time of next scheduled RN care management/care coordination outreach:  02/18/22 @ 2:30pm  Lurena Joiner RN, BSN [place] RN Care Coordinator

## 2022-01-20 NOTE — Patient Instructions (Signed)
Visit Information  [name] was given information about Medicaid Managed Care team care coordination services as a part of their Healthy [facility] Medicaid benefit. Angela [name] verbally consented to engagement with the [facility] Managed Care team.   If you are experiencing a medical emergency, please call 911 or report to your local emergency department or urgent care.   If you have a non-emergency medical problem during routine business hours, please contact your provider's office and ask to speak with a nurse.   For questions related to your Healthy [facility] health plan, please call: [phone] or visit the homepage here: [email or web address]  If you would like to schedule transportation through your Healthy [facility] plan, please call the following number at least 2 days in advance of your appointment: [phone]  Call the [place] at [phone], at any time, 24 hours a day, 7 days a week. If you are in danger or need immediate medical attention call 911.  If you would like help to quit smoking, call 1-800-QUIT-NOW 8635131521) OR Espaol: 1-855-Djelo-Ya ([phone]) o para ms informacin haga clic aqu or Text READY to 200-400 to register via text  [name],   Please see education materials related to diabetes, seizure disorder and insomnia provided by MyChart link.  The Patient has access to MyChart and can view provided education  Telephone follow up appointment with Managed Medicaid care management team member scheduled for:02/18/22 @ 2:30pm  Angela Joiner RN, BSN [place] RN Care Coordinator   Following is a copy of your plan of care:  Care Plan : RN Care Manager Plan of Care  Updates made by Melissa Montane, RN since 01/20/2022 12:00 AM     Problem: CHL AMB "PATIENT-SPECIFIC PROBLEM"      Long-Range Goal: Development of Plan of Care to Address Health Management  Needs Related to DMI and Seizure disorder   Start Date: 01/06/2022  Expected End Date: 05/06/2022  Priority: High  Note:   Current Barriers:  Chronic Disease Management support and education needs related to DMI and Seizure disorder [name] has all of her medications and taking as prescribed. She is wearing her Dexcom, reports readings in the mid 100's. Due to her living situation she is eating fast food 3 times weekly and drinks regular soda twice a day.   RNCM Clinical Goal(s):  Patient will verbalize understanding of plan for management of DMI and Seizure disorder as evidenced by patient verbalization of self monitoring activities take all medications exactly as prescribed and will call provider for medication related questions as evidenced by documentation in EMR    attend all scheduled medical appointments: as listed below as evidenced by provider documentation in EMR        work with pharmacist to address Medication procurement related to DMI and Seizure disorder as evidenced by review of EMR and patient or pharmacist report    work with Education officer, museum to address [place] Concerns  related to the management of DMI and seizure disorder as evidenced by review of EMR and patient or social worker report     work with Data processing manager care guide to address needs related to Housing barriers as evidenced by patient and/or community resource care guide support    through collaboration with Consulting civil engineer, provider, and care team.   Interventions: Inter-disciplinary care team collaboration (see longitudinal plan of care) Evaluation of current treatment plan related to  self management and patient's adherence to plan  as established by provider Provided information for transportation provided by [facility] [phone] Discussed sleep hygiene, information provided for better sleep   Diabetes:  (Status: Goal on Track (progressing): YES.) Long Term Goal   Lab Results  Component Value  Date   HGBA1C 8.3 (H) 10/15/2021   @ Assessed patient's understanding of A1c goal: <7% Reviewed medications with patient and discussed importance of medication adherence;        Reviewed prescribed diet with patient advised to cut back on fast food, and soda and to include more fruit and vegetables in her diet; Reviewed scheduled/upcoming provider appointments including: 2/6 with Diabetic Services, 2/13 for heart MRI, 2/27 with Endocrinology, 3/16 with Neurology and 3/21 with Cardiology;         call provider for findings outside established parameters;       Review of patient status, including review of consultants reports, relevant laboratory and other test results, and medications completed;        Seizure Disorder  (Status: Goal on Track (progressing): YES.) Long Term Goal  Evaluation of current treatment plan related to  Seizure disorder ,  self-management and patient's adherence to plan as established by provider. Discussed plans with patient for ongoing care management follow up and provided patient with direct contact information for care management team Reviewed medications with patient and discussed the importance of taking as directed; Provided patient with MyChart educational materials related to seizure disorder; Assessed social determinant of health barriers;   Patient Goals/Self-Care Activities: Take medications as prescribed   Attend all scheduled provider appointments Call pharmacy for medication refills 3-7 days in advance of running out of medications Call provider office for new concerns or questions  Work with the social worker to address care coordination needs and will continue to work with the clinical team to address health care and disease management related needs call the Suicide and Crisis Lifeline: 988 call 1-800-273-TALK (toll free, 24 hour hotline) go to [facility] [address] [place] [phone]) call 911 if  experiencing a Mental Health or [place]  Attend Diabetic Services appointment to restart insulin pump

## 2022-01-24 ENCOUNTER — Other Ambulatory Visit: Payer: Self-pay | Admitting: *Deleted

## 2022-01-24 DIAGNOSIS — F32A Depression, unspecified: Secondary | ICD-10-CM

## 2022-01-24 DIAGNOSIS — Z599 Problem related to housing and economic circumstances, unspecified: Secondary | ICD-10-CM

## 2022-01-24 NOTE — Patient Instructions (Signed)
Visit Information  Angela [name] was given information about Medicaid Managed Care team care coordination services as a part of their Healthy [facility] Medicaid benefit. Angela [name] verbally consented to engagement with the [facility] Managed Care team.   If you are experiencing a medical emergency, please call 911 or report to your local emergency department or urgent care.   If you have a non-emergency medical problem during routine business hours, please contact your provider's office and ask to speak with a nurse.   For questions related to your Healthy [facility] health plan, please call: [phone] or visit the homepage here: [email or web address]  If you would like to schedule transportation through your Healthy [facility] plan, please call the following number at least 2 days in advance of your appointment: [phone]  Call the [place] at [phone], at any time, 24 hours a day, 7 days a week. If you are in danger or need immediate medical attention call 911.  If you would like help to quit smoking, call 1-800-QUIT-NOW (856) 373-9069) OR Espaol: 1-855-Djelo-Ya (5-038-882-8003) o para ms informacin haga clic aqu or Text READY to 200-400 to register via text  [name] - following are the goals we discussed in your visit today:   Goals Addressed               This Visit's Progress     Find Help in My Community to Improve overall Quality of Life (pt-stated)        Timeframe:  Long-Range Goal Priority:  High Start Date:     01/24/22                        Expected End Date:               05/17/22        Follow Up Date 02/01/22    -expect cal from our [employer] for housing, advance directives, financial resources and other as identified - begin a notebook of services in my neighborhood or community - call 211 when I need some help - follow-up on any referrals for help I am given - think  ahead to make sure my need does not become an emergency - make a note about what I need to have by the phone or take with me, like an identification card or social security number have a back-up plan - have a back-up plan - make a list of family or friends that I can call    Why is this important?   Knowing how and where to find help for yourself or family in your neighborhood and community is an important skill.  You will want to take some steps to learn how.    Notes:         Please see education materials related to housing, Advance Directives, financial resources  provided  by Care Guide  The patient verbalized understanding of instructions provided today and agreed to receive a mailed copy of patient instruction and/or educational materials.  Social Worker will follow up 02/01/22.  Care Guide will reach out to you regarding resources  .  [name], LCSW Licensed Clinical Social Worker Managed Medicaid Coverage   [phone]   Following is a copy of your plan of care:  Care Plan : LCSW Plan of Care  Updates made by [name], LCSW since 01/24/2022 12:00 AM     Problem: Depression due to overall health, mental health  and financial concerns   Priority: High     Long-Range Goal: Provide resources, support and guidance to pt to better manage depression and financial strains   Start Date: 01/24/2022  Expected End Date: 05/17/2022  This Visit's Progress: On track  Priority: High  Note:   Current Barriers:  Financial constraints related to limited income, Mental Health Concerns , and Lacks knowledge of community resource: housing, financial support/resources, HCPOA   CSW Clinical Goal(s):  Patient  explore community resource options discussed during encounter for unmet needs related to:  Housing , Sales promotion account executive , Landscape architect , and Depression   through collaboration with Holiday representative, provider, and care team.   Interventions:  CSW spoke with  pt for initial assessment for CSW needs. Per pt, she is currently living between her mother and sister's homes- she has a Programmer, applications" and is seeking options but very limited at this time. Pt also shared with CSW a long history of depression- with a suicide attempt in her early 20's. She is on medications for depression and is not connected with any formal mental health support- Psychiatry and counseling and would like to be re-connected. "I don't want [facility]". CSW advised pt of plans to seek options based on her insurance coverage/provider of in-network options. Pt reports good support from her family- she denies current SI/HI. Pt completed PHQ 9 depression screening with CSW today; scoring high.  Depression screen [facility] 2/9 01/24/2022 12/28/2020 11/10/2020 05/08/2020 08/01/2019  Decreased Interest 3 2 1 2 2   Down, Depressed, Hopeless 3 2 1 1 3   PHQ - 2 Score 6 4 2 3 5   Altered sleeping 2 3 1 2 3   Tired, decreased energy 3 3 1 2 2   Change in appetite 1 2 1 2 2   Feeling bad or failure about yourself  3 3 1 3 2   Trouble concentrating 1 2 1 3 1   Moving slowly or fidgety/restless 1 2 1 1 2   Suicidal thoughts 1 1 1 1 1   PHQ-9 Score 18 20 9 17 18   Difficult doing work/chores Somewhat difficult - - - -  Some recent data might be hidden   Pt reminded of "988" number to call for mental health crises as well as other community resources in the area.   1:1 collaboration with primary care provider regarding development and update of comprehensive plan of care as evidenced by provider attestation and co-signature Inter-disciplinary care team collaboration (see longitudinal plan of care) Evaluation of current treatment plan related to  self management and patient's adherence to plan as established by provider Review resources, discussed options and provided patient information about  Transportation provided by insurance provider   Community food options  Referral to care guide    Patient Self-Care  Activities: Continue with compliance of taking medication  I have placed a referral to the community resource care guide they will call you with resources

## 2022-01-24 NOTE — Patient Outreach (Signed)
Medicaid Managed Care Social Work Note  01/24/2022 Name:  Angela [name] MRN:  [id number] DOB:  [birth year]  Angela [name] is an 36 y.o. year old female who is a primary patient of [name], MD.  The Medicaid Managed Care Coordination team was consulted for assistance with:  [place] and Resources Financial Difficulties related to   limited income  Ms. Dalby was given information about Medicaid Managed Care Coordination team services today. Angela [name] Patient agreed to services and verbal consent obtained.  Engaged with patient  for by telephone forinitial visit in response to referral for case management and/or care coordination services.   Assessments/Interventions:  Review of past medical history, allergies, medications, health status, including review of consultants reports, laboratory and other test data, was performed as part of comprehensive evaluation and provision of chronic care management services.  SDOH: (Social Determinant of Health) assessments and interventions performed: SDOH Interventions    Flowsheet Row Most Recent Value  SDOH Interventions   Transportation Interventions Intervention Not Indicated  Depression Interventions/Treatment  Referral to Psychiatry, Medication, Counseling       Advanced Directives Status:  See Care Plan for related entries.  Care Plan                 Allergies  Allergen Reactions   Ferumoxytol Itching   Lisinopril Other (See Comments)    hyperkalemia   Sulfamethoxazole Hives and Itching   Trimethoprim Hives    Medications Reviewed Today     Reviewed by [name], LCSW (Social Worker) on 01/24/22 at [place] List Status: <None>   Medication Order Taking? Sig Documenting Provider Last Dose Status Informant  albuterol (PROVENTIL HFA) 108 (90 Base) MCG/ACT inhaler [id number] No Inhale 2 puffs into the lungs every 6 (six) hours as needed for wheezing or  shortness of breath. [name], MD Taking Active Self  amLODipine (NORVASC) 10 MG tablet [id number] No TAKE ONE TABLET BY MOUTH EVERY MORNING [name], MD Taking Active   benzonatate (TESSALON) 100 MG capsule [id number] No Take 1 capsule (100 mg total) by mouth 2 (two) times daily as needed for cough.  Patient not taking: Reported on 12/01/2021   [name], MD Not Taking Active Self  busPIRone (BUSPAR) 15 MG tablet [id number] No TAKE ONE TABLET BY MOUTH EVERYDAY AT BEDTIME [name], MD Taking Active   Continuous Blood Gluc Sensor (DEXCOM G6 SENSOR) MISC [id number] No APPLY 1 SENSOR EVERY 10 DAYS [provider] Taking Active Self           Med Note (ROBB, MELANIE A   Thu Jan 06, 2022  4:22 PM)    Continuous Blood Gluc Sensor (DEXCOM G6 SENSOR) MISC [id number] No SMARTSIG:1 Topical Every 10 Days [provider] Taking Active Self           Med Note [name], MELANIE A   Thu Jan 06, 2022  4:22 PM)    Continuous Blood Gluc Transmit (DEXCOM G6 TRANSMITTER) MISC [id number] No See admin instructions. [provider] Taking Active Self           Med Note (ROBB, MELANIE A   Thu Jan 06, 2022  4:22 PM)    diltiazem (CARDIZEM CD) 240 MG 24 hr capsule [id number] No Take 240 mg by mouth daily. [provider] Taking Active Self  escitalopram (LEXAPRO) 20 MG tablet [id number] No TAKE ONE TABLET BY MOUTH ONCE DAILY [name], MD  Taking Active   furosemide (LASIX) 40 MG tablet [id number] No Take 2 tablets (80 mg total) by mouth daily. Janina Mayo, MD Taking Active   gabapentin (NEURONTIN) 300 MG capsule [id number] No TAKE TWO CAPSULES BY MOUTH EVERY MORNING and TAKE TWO CAPSULES BY MOUTH EVERYDAY AT BEDTIME  Patient taking differently: Take 600 mg by mouth 2 (two) times daily.   [name], MD Taking Active Self  Galcanezumab-gnlm [facility]) 120 MG/ML [name] [id number] No Inject 120 mg into the skin every 30 (thirty) days.  Patient  not taking: Reported on 12/01/2021   [name], MD Not Taking Active Self  hydrALAZINE (APRESOLINE) 50 MG tablet [id number] No TAKE TWO TABLETS BY MOUTH EVERY MORNING and TAKE TWO TABLETS BY MOUTH AT NOON and TAKE TWO TABLETS BY MOUTH EVERYDAY AT BEDTIME [name], MD Taking Active   Insulin Pen Needle 32G X 4 MM MISC [id number] No Use to inject insulin once a day. [name], MD Taking Active Self  Insulin Syringe-Needle U-100 (BD INSULIN SYRINGE ULTRAFINE) 31G X 15/64" 1 ML MISC [id number] No Used to give daily insulin injections. [name], MD Taking Active   ketoconazole (NIZORAL) 2 % shampoo [id number] No Apply 1 application topically 2 (two) times a week. Wash off after each use  Patient not taking: Reported on 01/06/2022   [name], MD Not Taking Active Self  levETIRAcetam (KEPPRA) 250 MG tablet [id number] No Take 1 tablet (250 mg total) by mouth every Monday, Wednesday, and Friday at 6 PM. Please take along with Keppra 500 mg p.o. daily on the days of dialysis, after dialysis. [name], MD Taking Active            Med Note [name], MELANIE A   Thu Jan 20, 2022  2:59 PM)    levETIRAcetam (KEPPRA) 500 MG tablet [id number] No Take 1 tablet (500 mg total) by mouth daily. [name], MD Taking Active   levothyroxine (SYNTHROID) 175 MCG tablet [id number] No Take 175 mcg by mouth every morning. [provider] Taking Active Self           Med Note (ROBB, MELANIE A   Thu Jan 20, 2022  2:59 PM)    losartan (COZAAR) 50 MG tablet [id number] No Take 1.5 tablets (75 mg total) by mouth daily. Janina Mayo, MD Taking Active   Patient not taking:  Discontinued 06/25/20 0424 multivitamin (RENA-VIT) TABS tablet [id number] No Take 1 tablet by mouth at bedtime. [provider] Taking Active Self  NOVOLOG 100 UNIT/ML injection [id number] No USE AS DIRECTED in insulin pump [name], MD Taking Active            Med Note (ROBB, MELANIE A   Thu  Jan 06, 2022  4:29 PM) Sliding scale until insulin pump restarted  ondansetron (ZOFRAN ODT) 4 MG disintegrating tablet [id number] No Take 1 tablet (4 mg total) by mouth daily as needed for nausea or vomiting. [name], MD Taking Active   pantoprazole (PROTONIX) 40 MG tablet [id number] No Take 1 tablet (40 mg total) by mouth daily. [name], MD Taking Active   pravastatin (PRAVACHOL) 20 MG tablet [id number] No TAKE ONE TABLET BY MOUTH EVERYDAY AT BEDTIME  Patient taking differently: Take 20 mg by mouth at bedtime.   [name], MD Taking Active Self  Rimegepant Sulfate (NURTEC) 75 MG TBDP [id number] No Take 75 mg by mouth daily as needed. For migraines. Take as close  to onset of migraine as possible. One daily maximum.  Patient not taking: Reported on 12/01/2021   [name], MD Not Taking Active Self  TRESIBA FLEXTOUCH 100 UNIT/ML FlexTouch Pen [id number] No Inject 12 Units into the skin daily as needed (high blood sugar). [provider] Taking Active Self           Med Note (ROBB, MELANIE A   Thu Jan 06, 2022  4:30 PM) 10 units every night until insulin pump restarted            Patient Active Problem List   Diagnosis Date Noted   Seizure ([place]) 10/15/2021   ESRD on dialysis ([place]) 10/15/2021   Essential hypertension 10/15/2021   Type 1 diabetes mellitus with hyperosmolar hyperglycemic state (HHS) ([place]) 10/15/2021   Influenza vaccine needed 12/28/2020   Acute renal failure superimposed on stage 5 chronic kidney disease, not on chronic dialysis ([place]) 11/16/2020   Mallory-Weiss tear 11/16/2020   Hypertensive urgency 11/16/2020   Chronic migraine without aura without status migrainosus, not intractable 04/09/2020   Occipital neuralgia of right side 04/09/2020   MDD (major depressive disorder), recurrent, in full remission ([place]) 12/27/2019   Hyperosmolar hyperglycemic state (HHS) ([place]) 12/03/2019   Anxiety about health 10/29/2019   Cannabis abuse,  episodic 10/29/2019   Abnormal uterine bleeding (AUB) 08/13/2019   Ovarian mass, left 11/27/2018   Moderate major depression ([place]) 11/27/2018   Right flank mass 05/03/2018   Secondary hyperparathyroidism ([place]) 05/01/2018   Anxiety and depression 06/23/2017   Insomnia 01/12/2016   Non compliance with medical treatment 01/05/2016   Mixed hyperlipidemia 01/05/2016   Chronic kidney disease (CKD) stage G3a/A3, moderately decreased glomerular filtration rate (GFR) between 45-59 mL/min/1.73 square meter and albuminuria creatinine ratio greater than 300 mg/g ([place]) 07/28/2015   Neurogenic bladder 02/20/2015   Diabetic peripheral neuropathy associated with type 1 diabetes mellitus ([place]) 02/20/2015   Iron deficiency 02/13/2015   Asthma 09/30/2014   Diabetic retinopathy ([place]) 09/30/2014   Atypical squamous cells of undetermined significance (ASCUS) on Papanicolaou smear of cervix 08/11/2014   Diabetic gastroparesis associated with type 1 diabetes mellitus ([place]) 08/05/2014   Anemia in chronic renal disease 08/05/2014   Hyperkalemia 08/04/2014   HTN (hypertension) 07/11/2012   GERD (gastroesophageal reflux disease) 09/26/2011   Hypothyroidism 09/14/2006   Uncontrolled type 1 diabetes mellitus with hypoglycemia, with long-term current use of insulin ([place]) 01/15/2000    Conditions to be addressed/monitored per PCP order:  ESRD and Depression  Care Plan : LCSW Plan of Care  Updates made by [name], LCSW since 01/24/2022 12:00 AM     Problem: Depression due to overall health, mental health and financial concerns   Priority: High     Long-Range Goal: Provide resources, support and guidance to pt to better manage depression and financial strains   Start Date: 01/24/2022  Expected End Date: 05/17/2022  This Visit's Progress: On track  Priority: High  Note:   Current Barriers:  Financial constraints related to limited income, Mental Health Concerns , and Lacks knowledge of community  resource: housing, financial support/resources, HCPOA   CSW Clinical Goal(s):  Patient  explore community resource options discussed during encounter for unmet needs related to:  Housing , Sales promotion account executive , Landscape architect , and Depression   through collaboration with Holiday representative, provider, and care team.   Interventions:  CSW spoke with pt for initial assessment for CSW needs. Per pt, she is currently living between her mother and sister's homes-  she has a Programmer, applications" and is seeking options but very limited at this time. Pt also shared with CSW a long history of depression- with a suicide attempt in her early 20's. She is on medications for depression and is not connected with any formal mental health support- Psychiatry and counseling and would like to be re-connected. "I don't want [facility]". CSW advised pt of plans to seek options based on her insurance coverage/provider of in-network options. Pt reports good support from her family- she denies current SI/HI. Pt completed PHQ 9 depression screening with CSW today; scoring high.  Depression screen [facility] 2/9 01/24/2022 12/28/2020 11/10/2020 05/08/2020 08/01/2019  Decreased Interest 3 2 1 2 2   Down, Depressed, Hopeless 3 2 1 1 3   PHQ - 2 Score 6 4 2 3 5   Altered sleeping 2 3 1 2 3   Tired, decreased energy 3 3 1 2 2   Change in appetite 1 2 1 2 2   Feeling bad or failure about yourself  3 3 1 3 2   Trouble concentrating 1 2 1 3 1   Moving slowly or fidgety/restless 1 2 1 1 2   Suicidal thoughts 1 1 1 1 1   PHQ-9 Score 18 20 9 17 18   Difficult doing work/chores Somewhat difficult - - - -  Some recent data might be hidden   Pt reminded of "988" number to call for mental health crises as well as other community resources in the area.   1:1 collaboration with primary care provider regarding development and update of comprehensive plan of care as evidenced by provider attestation and co-signature Inter-disciplinary care team collaboration  (see longitudinal plan of care) Evaluation of current treatment plan related to  self management and patient's adherence to plan as established by provider Review resources, discussed options and provided patient information about  Transportation provided by insurance provider   Community food options  Referral to care guide    Patient Self-Care Activities: Continue with compliance of taking medication  I have placed a referral to the community resource care guide they will call you with resources           Follow up:  Patient agrees to Care Plan and Follow-up.  Plan: The Managed Medicaid care management team will reach out to the patient again over the next   days.  Date/time of next scheduled Social Work care management/care coordination outreach:  02/01/22 [place] MSW, LCSW Licensed Clinical Social Worker Managed Medicaid Coverage   [phone]

## 2022-01-25 ENCOUNTER — Telehealth: Payer: Self-pay

## 2022-01-25 NOTE — Telephone Encounter (Signed)
° °  Telephone encounter was:  Successful.  01/25/2022 Name: Angela [name] MRN: [id number] DOB: 1986/05/24  [name] is a 36 y.o. year old female who is a primary care patient of [name], MD . The community resource team was consulted for assistance with  food , housing and advance directives packet  Care guide performed the following interventions: Spoke with patient verified email address [email or web address], sent information for Advance Directives packet, [employer], [place] Management, [place] Program, Solicitor, Physicist, medical, Marketing executive and [employer]. Emailed [place] Care to mail Advance Directives packet.  Follow Up Plan:  Care guide will follow up with patient by phone over the next 4-44 days  Romen Yutzy, AAS Paralegal, [place] Management  [address], [place] [address] ??[name]   ?? [phone]   [email or web address]

## 2022-01-28 ENCOUNTER — Ambulatory Visit: Payer: Self-pay

## 2022-01-31 ENCOUNTER — Ambulatory Visit (HOSPITAL_COMMUNITY): Payer: Medicaid Other

## 2022-02-01 ENCOUNTER — Ambulatory Visit: Payer: Self-pay

## 2022-02-01 ENCOUNTER — Telehealth: Payer: Self-pay

## 2022-02-01 NOTE — Telephone Encounter (Signed)
° °  Telephone encounter was:  Successful.  02/01/2022 Name: Angela [name] MRN: [id number] DOB: November 14, 1986  [name] is a 36 y.o. year old female who is a primary care patient of [name], MD . The community resource team was consulted for assistance with Financial Difficulties related to bills and Housing  Care guide performed the following interventions:  Outreached for housing needs but pt advised she is having financial difficulties as well. Pt advised she would like all communication sent by e-mail. Test e-mail was sent and pt received e-mail. Resources have been sent to e-mail as well. Enclosed in e-mail: [facility], [employer], [employer] assistance information, or [place] (LIEAP), [place] financial assistance packet and application, DSS information for transportation and Careers adviser and [place], and [employer] information.  Follow Up Plan:  No further follow up planned at this time. The patient has been provided with needed resources. Patient will contact me by phone or e-mail if she has any further questions or concerns.  [place] management  [place], [place]  Main Phone: [phone]   E-mail: [email or web address]  Website: [email or web address]

## 2022-02-02 ENCOUNTER — Telehealth: Payer: Self-pay | Admitting: *Deleted

## 2022-02-02 NOTE — Patient Outreach (Signed)
Care Coordination  02/02/2022  Angela [name] Nov 16, 1986 [id number]   Care Management   Follow Up Note   02/02/2022 Name: Angela [name] MRN: [id number] DOB: July 25, 1986   Referred by: [name], MD Reason for referral : No chief complaint on file.   An unsuccessful telephone outreach was attempted today. The patient was referred to the case management team for assistance with care management and care coordination.   Follow Up Plan: The care management team will reach out to the patient again over the next 10 days.   [name], LCSW Licensed Holiday representative Managed Medicaid Coverage   [phone]

## 2022-02-03 ENCOUNTER — Telehealth: Payer: Self-pay

## 2022-02-03 ENCOUNTER — Ambulatory Visit: Payer: Self-pay

## 2022-02-03 NOTE — Telephone Encounter (Signed)
° °  Telephone encounter was:  Successful.  02/03/2022 Name: Angela [name] MRN: [id number] DOB: 1986/07/24  Angela [name] is a 36 y.o. year old female who is a primary care patient of [name], MD . The community resource team was consulted for assistance with  food, housing, advanced directives  Care guide performed the following interventions: Spoke with patient to follow-up on information emailed for food, housing and advanced directives. Patient stated that she has received information.      Follow Up Plan:  No further follow up planned at this time. The patient has been provided with needed resources.  Amogh Komatsu, AAS Paralegal, [place] Management  [address], [place] [address] ??[email or web address]   ?? [phone]   [email or web address]

## 2022-02-04 ENCOUNTER — Telehealth: Payer: Self-pay | Admitting: *Deleted

## 2022-02-04 NOTE — Patient Outreach (Signed)
Care Coordination  02/04/2022  Carmell Elgin July 14, 1986 [id number]   CSW made an phone outreach attempt to try and contact patient for follow up and was unsuccessful.  A HIPAA compliant message was left for patient on voicemail. Care Guide to reach out to pt to reschedule if no return call is received.    [name], LCSW Licensed Holiday representative Managed Medicaid Coverage   [phone]

## 2022-02-10 NOTE — Telephone Encounter (Signed)
Pt last visit with provider was 10/21/21 and it was virtual. Will forward to provider

## 2022-02-15 ENCOUNTER — Ambulatory Visit: Payer: Self-pay

## 2022-02-15 DIAGNOSIS — Z992 Dependence on renal dialysis: Secondary | ICD-10-CM | POA: Diagnosis not present

## 2022-02-15 DIAGNOSIS — N186 End stage renal disease: Secondary | ICD-10-CM | POA: Diagnosis not present

## 2022-02-16 DIAGNOSIS — E1029 Type 1 diabetes mellitus with other diabetic kidney complication: Secondary | ICD-10-CM | POA: Diagnosis not present

## 2022-02-18 ENCOUNTER — Ambulatory Visit: Payer: Medicaid Other

## 2022-02-22 ENCOUNTER — Ambulatory Visit: Payer: Medicaid Other

## 2022-02-25 ENCOUNTER — Telehealth (HOSPITAL_COMMUNITY): Payer: Self-pay | Admitting: Emergency Medicine

## 2022-02-25 DIAGNOSIS — I517 Cardiomegaly: Secondary | ICD-10-CM

## 2022-02-25 DIAGNOSIS — I429 Cardiomyopathy, unspecified: Secondary | ICD-10-CM

## 2022-02-25 NOTE — Telephone Encounter (Signed)
Calling to inform patient that her nephrology team at [facility] on [facility] [facility] in [facility] does NOT recommend that she receive gadolinium for upcoming CMR on Tuesday 03/01/22. So I have cancelled her appt and informed the ordering provider.  ? ?I left my callback number should she have further questions. ? ?Nephrologist: Venita Sheffield ([facility]) was tagged in communication also ? ?Marchia Bond RN Navigator Cardiac Imaging ?[facility] ?[phone] Office  ?[phone] Cell ? ?

## 2022-03-01 ENCOUNTER — Ambulatory Visit (HOSPITAL_COMMUNITY): Admission: RE | Admit: 2022-03-01 | Payer: Medicaid Other | Source: Ambulatory Visit

## 2022-03-07 ENCOUNTER — Other Ambulatory Visit: Payer: Self-pay

## 2022-03-07 NOTE — Patient Outreach (Signed)
[facility]) Care Management ? ?03/07/2022 ? ?Angela [name] ?1986/01/14 ?[id number] ? ?LCSW completed [facility] outreach attempt today during scheduled appointment time but was unable to reach patient successfully. A HIPPA compliant voice message was left encouraging patient to return call once available. LCSW will ask Scheduling Care Guide to reschedule [facility] SW appointment with patient as well. ? ?[name], BSW, MSW, LCSW ?Managed Medicaid LCSW ?[facility] ?[name] ?Phone: [phone] ? ? ?

## 2022-03-07 NOTE — Patient Instructions (Signed)
Angela [name] ,  ? ?The [facility] Managed Care Team is available to provide assistance to you with your healthcare needs at no cost and as a benefit of your [facility] plan. I'm sorry I was unable to reach you today for our scheduled appointment. Our care guide will call you to reschedule our telephone appointment. Please call me at the number below. I am available to be of assistance to you regarding your healthcare needs. .  ? ?Thank you,  ? ?[name], BSW, MSW, LCSW ?Managed Medicaid LCSW ?[facility] ?[email or web address] ?Phone: [phone] ? ? ?

## 2022-03-08 ENCOUNTER — Telehealth: Payer: Self-pay | Admitting: Internal Medicine

## 2022-03-08 ENCOUNTER — Ambulatory Visit: Payer: Medicaid Other | Admitting: Internal Medicine

## 2022-03-08 NOTE — Telephone Encounter (Signed)
PYP Scan ordered with request Blood work- PE and FLC. Lab orders mailed to patient.  ?

## 2022-03-08 NOTE — Addendum Note (Signed)
Addended by: Rexanne Mano B on: 03/08/2022 11:56 AM ? ? Modules accepted: Orders ? ?

## 2022-03-08 NOTE — Telephone Encounter (Signed)
.. ?  Medicaid Managed Care  ? ?Unsuccessful Outreach Note ? ?03/08/2022 ?Name: Angela [name] MRN: [id number] DOB: 1986-05-03 ? ?Referred by: [name], MD ?Reason for referral : High Risk Managed Medicaid (I called the patient today to get her rescheduled with the MM LCSW. I left my name and number on her VM.) ? ? ?An unsuccessful telephone outreach was attempted today. The patient was referred to the case management team for assistance with care management and care coordination.  ? ?Follow Up Plan: The care management team will reach out to the patient again over the next 7 days.  ? ?Reita Chard ?Care Guide, High Risk Medicaid Managed Care ?Embedded Care Coordination ?[place]  ? ? ? ?

## 2022-03-08 NOTE — Telephone Encounter (Signed)
Called Angela [name]. Discussed PYP and further w/u for mod-severe LVH features c/f amyloidosis. We will order these studies ?

## 2022-03-10 ENCOUNTER — Telehealth (HOSPITAL_COMMUNITY): Payer: Self-pay | Admitting: *Deleted

## 2022-03-10 NOTE — Telephone Encounter (Signed)
Patient is scheduled for PYP scan on Friday 3/24- spoke with patient and patient will also come in to get blood work done (PE&FLC) on Friday as well.  ? ?Advised patient to call back to office with any issues, questions, or concerns. Patient verbalized understanding.  ? ?

## 2022-03-10 NOTE — Telephone Encounter (Signed)
Close encounter 

## 2022-03-11 ENCOUNTER — Other Ambulatory Visit: Payer: Self-pay

## 2022-03-11 ENCOUNTER — Ambulatory Visit (HOSPITAL_COMMUNITY)
Admission: RE | Admit: 2022-03-11 | Discharge: 2022-03-11 | Disposition: A | Discharge status: Home / Self Care | Payer: Medicaid Other | Source: Ambulatory Visit | Attending: Cardiovascular Disease | Admitting: Cardiovascular Disease

## 2022-03-11 DIAGNOSIS — I517 Cardiomegaly: Secondary | ICD-10-CM

## 2022-03-11 DIAGNOSIS — I429 Cardiomyopathy, unspecified: Secondary | ICD-10-CM

## 2022-03-11 MED ORDER — TECHNETIUM TC 99M PYROPHOSPHATE
20.2000 | Freq: Once | INTRAVENOUS | Status: AC
  Administered 2022-03-11: 20.2 via INTRAVENOUS

## 2022-03-14 ENCOUNTER — Encounter: Payer: Self-pay | Admitting: Internal Medicine

## 2022-03-16 ENCOUNTER — Other Ambulatory Visit: Payer: Self-pay | Admitting: Internal Medicine

## 2022-03-16 DIAGNOSIS — G40909 Epilepsy, unspecified, not intractable, without status epilepticus: Secondary | ICD-10-CM

## 2022-03-16 LAB — PE AND FLC, SERUM
A/G Ratio: 1.5 (ref 0.7–1.7)
Albumin ELP: 3.5 g/dL (ref 2.9–4.4)
Alpha 1: 0.2 g/dL (ref 0.0–0.4)
Alpha 2: 0.5 g/dL (ref 0.4–1.0)
Beta: 0.6 g/dL — ABNORMAL LOW (ref 0.7–1.3)
Gamma Globulin: 1 g/dL (ref 0.4–1.8)
Globulin, Total: 2.4 g/dL (ref 2.2–3.9)
Ig Kappa Free Light Chain: 126.7 mg/L — ABNORMAL HIGH (ref 3.3–19.4)
Ig Lambda Free Light Chain: 102.4 mg/L — ABNORMAL HIGH (ref 5.7–26.3)
KAPPA/LAMBDA RATIO: 1.24 (ref 0.26–1.65)
Total Protein: 5.9 g/dL — ABNORMAL LOW (ref 6.0–8.5)

## 2022-03-18 DIAGNOSIS — N186 End stage renal disease: Secondary | ICD-10-CM | POA: Diagnosis not present

## 2022-03-18 DIAGNOSIS — Z992 Dependence on renal dialysis: Secondary | ICD-10-CM | POA: Diagnosis not present

## 2022-03-20 ENCOUNTER — Other Ambulatory Visit: Payer: Self-pay | Admitting: Internal Medicine

## 2022-03-20 DIAGNOSIS — F331 Major depressive disorder, recurrent, moderate: Secondary | ICD-10-CM

## 2022-03-20 DIAGNOSIS — F418 Other specified anxiety disorders: Secondary | ICD-10-CM

## 2022-03-22 NOTE — Telephone Encounter (Signed)
Requesting early, needs to schedule appt. ?Requested Prescriptions  ?Pending Prescriptions Disp Refills  ?? escitalopram (LEXAPRO) 20 MG tablet [Pharmacy Med Name: escitalopram 20 mg tablet] 30 tablet 2  ?  Sig: TAKE ONE TABLET BY MOUTH ONCE DAILY  ?  ? Psychiatry:  Antidepressants - SSRI Passed - 03/20/2022  8:00 AM  ?  ?  Passed - Completed PHQ-2 or PHQ-9 in the last 360 days  ?  ?  Passed - Valid encounter within last 6 months  ?  Recent Outpatient Visits   ?      ? 5 months ago Hospital discharge follow-up  ? [place] Kar[place] Plumber B, MD  ? 7 months ago Bronchitis with asthma, acute  ? [place] [name], MD  ? 12 months ago Onycholysis  ? [place], [place], [place]  ? 1 year ago Other insomnia  ? [place] [name], MD  ? 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 [facility], [name], MD  ?  ?  ? ?  ?  ?  ? ? ?

## 2022-03-23 DIAGNOSIS — E1029 Type 1 diabetes mellitus with other diabetic kidney complication: Secondary | ICD-10-CM | POA: Diagnosis not present

## 2022-03-24 ENCOUNTER — Other Ambulatory Visit: Payer: Self-pay | Admitting: Internal Medicine

## 2022-03-24 DIAGNOSIS — F418 Other specified anxiety disorders: Secondary | ICD-10-CM

## 2022-03-24 DIAGNOSIS — F331 Major depressive disorder, recurrent, moderate: Secondary | ICD-10-CM

## 2022-04-04 DIAGNOSIS — T82858A Stenosis of vascular prosthetic devices, implants and grafts, initial encounter: Secondary | ICD-10-CM | POA: Diagnosis not present

## 2022-04-04 DIAGNOSIS — N186 End stage renal disease: Secondary | ICD-10-CM | POA: Diagnosis not present

## 2022-04-04 DIAGNOSIS — I871 Compression of vein: Secondary | ICD-10-CM | POA: Diagnosis not present

## 2022-04-04 DIAGNOSIS — Z992 Dependence on renal dialysis: Secondary | ICD-10-CM | POA: Diagnosis not present

## 2022-04-05 ENCOUNTER — Emergency Department (HOSPITAL_COMMUNITY): Payer: Medicaid Other

## 2022-04-05 ENCOUNTER — Encounter (HOSPITAL_COMMUNITY): Payer: Self-pay

## 2022-04-05 ENCOUNTER — Other Ambulatory Visit: Payer: Self-pay

## 2022-04-05 ENCOUNTER — Emergency Department (HOSPITAL_COMMUNITY)
Admission: EM | Admit: 2022-04-05 | Discharge: 2022-04-06 | Disposition: A | Discharge status: Home / Self Care | Payer: Medicaid Other | Attending: Emergency Medicine | Admitting: Emergency Medicine

## 2022-04-05 DIAGNOSIS — Z794 Long term (current) use of insulin: Secondary | ICD-10-CM | POA: Diagnosis not present

## 2022-04-05 DIAGNOSIS — R0789 Other chest pain: Secondary | ICD-10-CM | POA: Diagnosis not present

## 2022-04-05 DIAGNOSIS — E1022 Type 1 diabetes mellitus with diabetic chronic kidney disease: Secondary | ICD-10-CM | POA: Diagnosis not present

## 2022-04-05 DIAGNOSIS — I120 Hypertensive chronic kidney disease with stage 5 chronic kidney disease or end stage renal disease: Secondary | ICD-10-CM | POA: Diagnosis not present

## 2022-04-05 DIAGNOSIS — I517 Cardiomegaly: Secondary | ICD-10-CM | POA: Diagnosis not present

## 2022-04-05 DIAGNOSIS — Z79899 Other long term (current) drug therapy: Secondary | ICD-10-CM | POA: Insufficient documentation

## 2022-04-05 DIAGNOSIS — N186 End stage renal disease: Secondary | ICD-10-CM | POA: Diagnosis not present

## 2022-04-05 DIAGNOSIS — G459 Transient cerebral ischemic attack, unspecified: Secondary | ICD-10-CM | POA: Insufficient documentation

## 2022-04-05 DIAGNOSIS — Z992 Dependence on renal dialysis: Secondary | ICD-10-CM | POA: Insufficient documentation

## 2022-04-05 DIAGNOSIS — R000 Tachycardia, unspecified: Secondary | ICD-10-CM | POA: Diagnosis not present

## 2022-04-05 DIAGNOSIS — R569 Unspecified convulsions: Secondary | ICD-10-CM

## 2022-04-05 DIAGNOSIS — G40909 Epilepsy, unspecified, not intractable, without status epilepticus: Secondary | ICD-10-CM | POA: Diagnosis not present

## 2022-04-05 DIAGNOSIS — R29818 Other symptoms and signs involving the nervous system: Secondary | ICD-10-CM

## 2022-04-05 DIAGNOSIS — R1084 Generalized abdominal pain: Secondary | ICD-10-CM | POA: Diagnosis not present

## 2022-04-05 DIAGNOSIS — R200 Anesthesia of skin: Secondary | ICD-10-CM | POA: Diagnosis not present

## 2022-04-05 DIAGNOSIS — R519 Headache, unspecified: Secondary | ICD-10-CM | POA: Diagnosis not present

## 2022-04-05 DIAGNOSIS — R739 Hyperglycemia, unspecified: Secondary | ICD-10-CM | POA: Diagnosis not present

## 2022-04-05 DIAGNOSIS — R079 Chest pain, unspecified: Secondary | ICD-10-CM | POA: Diagnosis not present

## 2022-04-05 LAB — CBC WITH DIFFERENTIAL/PLATELET
Abs Immature Granulocytes: 0.01 10*3/uL (ref 0.00–0.07)
Basophils Absolute: 0 10*3/uL (ref 0.0–0.1)
Basophils Relative: 1 %
Eosinophils Absolute: 0.4 10*3/uL (ref 0.0–0.5)
Eosinophils Relative: 8 %
HCT: 33.2 % — ABNORMAL LOW (ref 36.0–46.0)
Hemoglobin: 10.3 g/dL — ABNORMAL LOW (ref 12.0–15.0)
Immature Granulocytes: 0 %
Lymphocytes Relative: 28 %
Lymphs Abs: 1.4 10*3/uL (ref 0.7–4.0)
MCH: 28.9 pg (ref 26.0–34.0)
MCHC: 31 g/dL (ref 30.0–36.0)
MCV: 93.3 fL (ref 80.0–100.0)
Monocytes Absolute: 0.5 10*3/uL (ref 0.1–1.0)
Monocytes Relative: 10 %
Neutro Abs: 2.5 10*3/uL (ref 1.7–7.7)
Neutrophils Relative %: 53 %
Platelets: 84 10*3/uL — ABNORMAL LOW (ref 150–400)
RBC: 3.56 MIL/uL — ABNORMAL LOW (ref 3.87–5.11)
RDW: 13.6 % (ref 11.5–15.5)
WBC: 4.8 10*3/uL (ref 4.0–10.5)
nRBC: 0 % (ref 0.0–0.2)

## 2022-04-05 LAB — I-STAT CHEM 8, ED
BUN: 16 mg/dL (ref 6–20)
Calcium, Ion: 1.11 mmol/L — ABNORMAL LOW (ref 1.15–1.40)
Chloride: 99 mmol/L (ref 98–111)
Creatinine, Ser: 4.8 mg/dL — ABNORMAL HIGH (ref 0.44–1.00)
Glucose, Bld: 362 mg/dL — ABNORMAL HIGH (ref 70–99)
HCT: 36 % (ref 36.0–46.0)
Hemoglobin: 12.2 g/dL (ref 12.0–15.0)
Potassium: 4 mmol/L (ref 3.5–5.1)
Sodium: 136 mmol/L (ref 135–145)
TCO2: 28 mmol/L (ref 22–32)

## 2022-04-05 LAB — COMPREHENSIVE METABOLIC PANEL
ALT: 19 U/L (ref 0–44)
AST: 20 U/L (ref 15–41)
Albumin: 3.4 g/dL — ABNORMAL LOW (ref 3.5–5.0)
Alkaline Phosphatase: 119 U/L (ref 38–126)
Anion gap: 10 (ref 5–15)
BUN: 16 mg/dL (ref 6–20)
CO2: 26 mmol/L (ref 22–32)
Calcium: 8.9 mg/dL (ref 8.9–10.3)
Chloride: 100 mmol/L (ref 98–111)
Creatinine, Ser: 4.75 mg/dL — ABNORMAL HIGH (ref 0.44–1.00)
GFR, Estimated: 11 mL/min — ABNORMAL LOW (ref >60)
Glucose, Bld: 359 mg/dL — ABNORMAL HIGH (ref 70–99)
Potassium: 4 mmol/L (ref 3.5–5.1)
Sodium: 136 mmol/L (ref 135–145)
Total Bilirubin: 0.5 mg/dL (ref 0.3–1.2)
Total Protein: 5.9 g/dL — ABNORMAL LOW (ref 6.5–8.1)

## 2022-04-05 LAB — CBG MONITORING, ED: Glucose-Capillary: 406 mg/dL — ABNORMAL HIGH (ref 70–99)

## 2022-04-05 MED ORDER — LEVETIRACETAM IN NACL 1000 MG/100ML IV SOLN
1000.0000 mg | Freq: Once | INTRAVENOUS | Status: AC
  Administered 2022-04-05: 1000 mg via INTRAVENOUS
  Filled 2022-04-05: qty 100

## 2022-04-05 MED ORDER — LABETALOL HCL 5 MG/ML IV SOLN
10.0000 mg | Freq: Once | INTRAVENOUS | Status: AC
  Administered 2022-04-05: 10 mg via INTRAVENOUS
  Filled 2022-04-05: qty 4

## 2022-04-05 NOTE — ED Notes (Signed)
Patient transported to MRI 

## 2022-04-05 NOTE — ED Provider Notes (Signed)
?[facility] ?Provider Note ? ? ?CSN: [id number] ?Arrival date & time: 04/05/22  1652 ? ?  ? ?History ? ?Chief Complaint  ?Patient presents with  ? Seizures  ? ? ?Angela [name] is a 36 y.o. female. ? ?The history is provided by the patient and medical records. No language interpreter was used.  ?Seizures ?Seizure activity on arrival: no   ?Seizure type:  Tonic ?Preceding symptoms: headache   ?Initial focality:  Right-sided (right numbgness preceding) ?Episode characteristics: partial responsiveness and stiffening   ?Postictal symptoms: confusion and somnolence   ?Return to baseline: no   ?Duration:  1 minute ?Timing:  Once ?Progression:  Unchanged ?Context: not change in medication   ?Context comment:  Change in dialysis treatment t his week ?Recent head injury:  No recent head injuries ?PTA treatment:  None ?History of seizures: yes   ? ?  ? ?Home Medications ?Prior to Admission medications   ?Medication Sig Start Date End Date Taking? Authorizing Provider  ?albuterol (PROVENTIL HFA) 108 (90 Base) MCG/ACT inhaler Inhale 2 puffs into the lungs every 6 (six) hours as needed for wheezing or shortness of breath. 08/19/21   [name], MD  ?amLODipine (NORVASC) 10 MG tablet TAKE ONE TABLET BY MOUTH EVERY MORNING 12/16/21   [name], MD  ?benzonatate (TESSALON) 100 MG capsule Take 1 capsule (100 mg total) by mouth 2 (two) times daily as needed for cough. ?Patient not taking: Reported on 12/01/2021 08/19/21   [name], MD  ?busPIRone (BUSPAR) 15 MG tablet TAKE ONE TABLET BY MOUTH EVERYDAY AT BEDTIME 11/15/21   [name], MD  ?Continuous Blood Gluc Sensor (DEXCOM G6 SENSOR) MISC APPLY 1 SENSOR EVERY 10 DAYS 07/15/21   [provider]  ?Continuous Blood Gluc Sensor (DEXCOM G6 SENSOR) MISC SMARTSIG:1 Topical Every 10 Days 10/11/21   [provider]  ?Continuous Blood Gluc Transmit (DEXCOM G6 TRANSMITTER) MISC See admin instructions.  03/04/21   [provider]  ?diltiazem (CARDIZEM CD) 240 MG 24 hr capsule Take 240 mg by mouth daily. 08/27/21   [provider]  ?escitalopram (LEXAPRO) 20 MG tablet TAKE ONE TABLET BY MOUTH ONCE DAILY 03/24/22   [name], MD  ?furosemide (LASIX) 40 MG tablet Take 2 tablets (80 mg total) by mouth daily. 12/01/21   Janina Mayo, MD  ?gabapentin (NEURONTIN) 300 MG capsule TAKE TWO CAPSULES BY MOUTH EVERY MORNING and TAKE TWO CAPSULES BY MOUTH EVERYDAY AT BEDTIME 02/10/22   [name], MD  ?Galcanezumab-gnlm [facility]) 120 MG/ML SOAJ Inject 120 mg into the skin every 30 (thirty) days. ?Patient not taking: Reported on 12/01/2021 01/05/21   [name], MD  ?hydrALAZINE (APRESOLINE) 50 MG tablet TAKE TWO TABLETS BY MOUTH EVERY MORNING and TAKE TWO TABLETS BY MOUTH AT NOON and TAKE TWO TABLETS BY MOUTH EVERYDAY AT BEDTIME 12/30/21   [name], MD  ?Insulin Pen Needle 32G X 4 MM MISC Use to inject insulin once a day. 06/29/21   [name], MD  ?Insulin Syringe-Needle U-100 (BD INSULIN SYRINGE ULTRAFINE) 31G X 15/64" 1 ML MISC Used to give daily insulin injections. 04/24/18   [name], MD  ?ketoconazole (NIZORAL) 2 % shampoo Apply 1 application topically 2 (two) times a week. Wash off after each use ?Patient not taking: Reported on 01/06/2022 08/19/21   [name], MD  ?levETIRAcetam (KEPPRA) 250 MG tablet Take 1 tablet (250 mg total) by mouth every Monday, Wednesday, and  Friday at 6 PM. Please take along with Keppra 500 mg p.o. daily on the days of dialysis, after dialysis. 10/22/21   [name], MD  ?levETIRAcetam (KEPPRA) 500 MG tablet TAKE 1 TABLET(500 MG) BY MOUTH DAILY AS DIRECTED 03/16/22   [name], MD  ?levothyroxine (SYNTHROID) 175 MCG tablet Take 175 mcg by mouth every morning. 06/09/21   [provider]  ?losartan (COZAAR) 50 MG tablet Take 1.5 tablets (75 mg total) by mouth daily. 12/01/21   Janina Mayo, MD  ?multivitamin  (RENA-VIT) TABS tablet Take 1 tablet by mouth at bedtime. 10/01/21   [provider]  ?NOVOLOG 100 UNIT/ML injection USE AS DIRECTED in insulin pump 11/01/21   [name], MD  ?ondansetron (ZOFRAN ODT) 4 MG disintegrating tablet Take 1 tablet (4 mg total) by mouth daily as needed for nausea or vomiting. 12/10/21   [name], MD  ?pantoprazole (PROTONIX) 40 MG tablet Take 1 tablet (40 mg total) by mouth daily. 12/10/21   [name], MD  ?pravastatin (PRAVACHOL) 20 MG tablet TAKE ONE TABLET BY MOUTH EVERYDAY AT BEDTIME 02/10/22   [name], MD  ?Rimegepant Sulfate (NURTEC) 75 MG TBDP Take 75 mg by mouth daily as needed. For migraines. Take as close to onset of migraine as possible. One daily maximum. ?Patient not taking: Reported on 12/01/2021 01/05/21   [name], MD  ?TRESIBA FLEXTOUCH 100 UNIT/ML FlexTouch Pen Inject 12 Units into the skin daily as needed (high blood sugar). 05/31/21   [provider]  ?metoCLOPramide (REGLAN) 5 MG tablet Take 1 tablet (5 mg total) by mouth 3 (three) times daily as needed for nausea or vomiting. ?Patient not taking: Reported on 06/25/2020 02/28/19 06/25/20  [name], MD  ?   ? ?Allergies    ?Ferumoxytol, Lisinopril, Sulfamethoxazole, and Trimethoprim   ? ?Review of Systems   ?Review of Systems  ?Constitutional:  Negative for chills, fatigue and fever.  ?HENT:  Negative for congestion.   ?Eyes:  Positive for photophobia. Negative for visual disturbance.  ?Respiratory:  Negative for cough, chest tightness, shortness of breath and wheezing.   ?Cardiovascular:  Negative for chest pain, palpitations and leg swelling.  ?Gastrointestinal:  Negative for abdominal pain, constipation, diarrhea, nausea and vomiting.  ?Genitourinary:  Negative for dysuria, flank pain and frequency.  ?Musculoskeletal:  Negative for back pain.  ?Skin:  Negative for rash.  ?Neurological:  Positive for seizures, numbness and headaches. Negative for  dizziness, syncope, facial asymmetry, speech difficulty, weakness and light-headedness.  ?Psychiatric/Behavioral:  Negative for agitation.   ?All other systems reviewed and are negative. ? ?Physical Exam ?Updated Vital Signs ?BP (!) 179/92   Pulse (!) 102   Temp 98 ?F (36.7 ?C) (Oral)   Resp 14   SpO2 95%  ?Physical Exam ?Vitals and nursing note reviewed.  ?Constitutional:   ?   General: She is not in acute distress. ?   Appearance: She is well-developed. She is not ill-appearing, toxic-appearing or diaphoretic.  ?HENT:  ?   Head: Normocephalic and atraumatic.  ?   Nose: No congestion or rhinorrhea.  ?   Mouth/Throat:  ?   Mouth: Mucous membranes are moist.  ?Eyes:  ?   Extraocular Movements: Extraocular movements intact.  ?   Conjunctiva/sclera: Conjunctivae normal.  ?   Pupils: Pupils are equal, round, and reactive to light.  ?Neck:  ?   Vascular: No carotid bruit.  ?Cardiovascular:  ?   Rate and Rhythm:  Normal rate and regular rhythm.  ?   Heart sounds: No murmur heard. ?Pulmonary:  ?   Effort: Pulmonary effort is normal. No respiratory distress.  ?   Breath sounds: Normal breath sounds. No wheezing, rhonchi or rales.  ?Chest:  ?   Chest wall: No tenderness.  ?Abdominal:  ?   General: Abdomen is flat.  ?   Palpations: Abdomen is soft.  ?   Tenderness: There is no abdominal tenderness. There is no right CVA tenderness, left CVA tenderness, guarding or rebound.  ?Musculoskeletal:     ?   General: No swelling or tenderness.  ?   Cervical back: Neck supple. No tenderness.  ?   Left lower leg: No edema.  ?Skin: ?   General: Skin is warm and dry.  ?   Capillary Refill: Capillary refill takes less than 2 seconds.  ?Neurological:  ?   Mental Status: She is alert.  ?   Cranial Nerves: No dysarthria or facial asymmetry.  ?   Sensory: Sensory deficit present.  ?   Motor: No weakness, abnormal muscle tone or seizure activity (not since arrival).  ?   Comments: Only neurologic deficit present initially is some subtle  right-sided facial numbness compared to left.  No facial droop.  Symmetric smile.  Clear speech.  Pupils symmetric and reactive with normal extraocular movements.  Intact sensation, strength, in upper and lower extremitie

## 2022-04-05 NOTE — Discharge Instructions (Signed)
Your history, exam, work-up today are consistent with a breakthrough seizure that may have been related to some of the alteration in your dialysis this week and fluid balance.  Your CT and MRI both were reassuring with no evidence of bleeding, stroke, or other intracranial abnormality.  We loaded you with Keppra as recommended by neurology.  Your other work-up was overall reassuring.  The numbness you are feeling has since resolved and you have proven stability for over 6-1/2 hours.  After shared decision-making conversation and discussion we feel you are safe for discharge home.  Please continue your outpatient dialysis plan and follow-up with your primary team.  If any symptoms change or worsen acutely, please return to the nearest emergency department. ?

## 2022-04-05 NOTE — ED Triage Notes (Signed)
Patient arrives via EMS from home due to seizure activity. Patient has hx of seizure and is also a dialysis patient. 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 BG 313, BP 166/98, 100 HR. ?

## 2022-04-06 LAB — LEVETIRACETAM LEVEL: Levetiracetam Lvl: <2 ug/mL — ABNORMAL LOW (ref 10.0–40.0)

## 2022-04-06 NOTE — ED Notes (Signed)
All discharge instructions reviewed with patient and patient verbalized understanding of same. Patient stable and ambulatory at time of discharge and was discharged home with ride from mother.  ?

## 2022-04-07 ENCOUNTER — Telehealth: Payer: Self-pay

## 2022-04-07 NOTE — Telephone Encounter (Signed)
Transition Care Management Unsuccessful Follow-up Telephone Call ? ?Date of discharge and from where:  04/06/2022 from [facility] ED ? ?Attempts:  1st Attempt ? ?Reason for unsuccessful TCM follow-up call:  Left voice message ? ? ? ?

## 2022-04-11 NOTE — Telephone Encounter (Signed)
Transition Care Management Unsuccessful Follow-up Telephone Call ? ?Date of discharge and from where:  04/06/2022-[facility] ED  ? ?Attempts:  2nd Attempt ? ?Reason for unsuccessful TCM follow-up call:  Left voice message ? ?  ?

## 2022-04-12 DIAGNOSIS — R569 Unspecified convulsions: Secondary | ICD-10-CM | POA: Diagnosis not present

## 2022-04-12 DIAGNOSIS — R202 Paresthesia of skin: Secondary | ICD-10-CM | POA: Diagnosis not present

## 2022-04-12 DIAGNOSIS — R9089 Other abnormal findings on diagnostic imaging of central nervous system: Secondary | ICD-10-CM | POA: Diagnosis not present

## 2022-04-12 DIAGNOSIS — Z79899 Other long term (current) drug therapy: Secondary | ICD-10-CM | POA: Diagnosis not present

## 2022-04-12 DIAGNOSIS — Z5181 Encounter for therapeutic drug level monitoring: Secondary | ICD-10-CM | POA: Diagnosis not present

## 2022-04-12 NOTE — Telephone Encounter (Signed)
Transition Care Management Follow-up Telephone Call ?Date of discharge and from where: 04/06/2022-[facility] ED  ?How have you been since you were released from the hospital? Patient stated that she is feeling better and did not have any questions or concerns at this time.  ?Any questions or concerns? No ? ?Items Reviewed: ?Did the pt receive and understand the discharge instructions provided? Yes  ?Medications obtained and verified? Yes  ?Other? No  ?Any new allergies since your discharge? No  ?Dietary orders reviewed? No ?Do you have support at home? Yes  ? ?Functional Questionnaire: (I = Independent and D = Dependent) ?ADLs: I ? ?Bathing/Dressing- I ? ?Meal Prep- I ? ?Eating- I ? ?Maintaining continence- I ? ?Transferring/Ambulation- I ? ?Managing Meds- I ? ?Follow up appointments reviewed: ? ?PCP Hospital f/u appt confirmed? No   ?Specialist Hospital f/u appt confirmed? No   ?Are transportation arrangements needed? No  ?If their condition worsens, is the pt aware to call PCP or go to the Emergency Dept.? Yes ?Was the patient provided with contact information for the PCP's office or ED? Yes ?Was to pt encouraged to call back with questions or concerns? Yes ? ?

## 2022-04-17 DIAGNOSIS — N186 End stage renal disease: Secondary | ICD-10-CM | POA: Diagnosis not present

## 2022-04-17 DIAGNOSIS — Z992 Dependence on renal dialysis: Secondary | ICD-10-CM | POA: Diagnosis not present

## 2022-04-18 DIAGNOSIS — E8779 Other fluid overload: Secondary | ICD-10-CM | POA: Diagnosis not present

## 2022-04-18 DIAGNOSIS — N186 End stage renal disease: Secondary | ICD-10-CM | POA: Diagnosis not present

## 2022-04-18 DIAGNOSIS — D509 Iron deficiency anemia, unspecified: Secondary | ICD-10-CM | POA: Diagnosis not present

## 2022-04-18 DIAGNOSIS — N2581 Secondary hyperparathyroidism of renal origin: Secondary | ICD-10-CM | POA: Diagnosis not present

## 2022-04-20 DIAGNOSIS — N2581 Secondary hyperparathyroidism of renal origin: Secondary | ICD-10-CM | POA: Diagnosis not present

## 2022-04-20 DIAGNOSIS — N186 End stage renal disease: Secondary | ICD-10-CM | POA: Diagnosis not present

## 2022-04-20 DIAGNOSIS — D509 Iron deficiency anemia, unspecified: Secondary | ICD-10-CM | POA: Diagnosis not present

## 2022-04-20 DIAGNOSIS — E8779 Other fluid overload: Secondary | ICD-10-CM | POA: Diagnosis not present

## 2022-04-20 DIAGNOSIS — E1029 Type 1 diabetes mellitus with other diabetic kidney complication: Secondary | ICD-10-CM | POA: Diagnosis not present

## 2022-04-22 DIAGNOSIS — N2581 Secondary hyperparathyroidism of renal origin: Secondary | ICD-10-CM | POA: Diagnosis not present

## 2022-04-22 DIAGNOSIS — D509 Iron deficiency anemia, unspecified: Secondary | ICD-10-CM | POA: Diagnosis not present

## 2022-04-22 DIAGNOSIS — E8779 Other fluid overload: Secondary | ICD-10-CM | POA: Diagnosis not present

## 2022-04-22 DIAGNOSIS — N186 End stage renal disease: Secondary | ICD-10-CM | POA: Diagnosis not present

## 2022-04-25 DIAGNOSIS — N186 End stage renal disease: Secondary | ICD-10-CM | POA: Diagnosis not present

## 2022-04-25 DIAGNOSIS — D509 Iron deficiency anemia, unspecified: Secondary | ICD-10-CM | POA: Diagnosis not present

## 2022-04-25 DIAGNOSIS — E8779 Other fluid overload: Secondary | ICD-10-CM | POA: Diagnosis not present

## 2022-04-25 DIAGNOSIS — N2581 Secondary hyperparathyroidism of renal origin: Secondary | ICD-10-CM | POA: Diagnosis not present

## 2022-04-26 ENCOUNTER — Other Ambulatory Visit: Payer: Self-pay | Admitting: Internal Medicine

## 2022-04-26 DIAGNOSIS — F418 Other specified anxiety disorders: Secondary | ICD-10-CM

## 2022-04-26 DIAGNOSIS — F331 Major depressive disorder, recurrent, moderate: Secondary | ICD-10-CM

## 2022-04-27 DIAGNOSIS — N186 End stage renal disease: Secondary | ICD-10-CM | POA: Diagnosis not present

## 2022-04-27 DIAGNOSIS — N2581 Secondary hyperparathyroidism of renal origin: Secondary | ICD-10-CM | POA: Diagnosis not present

## 2022-04-27 DIAGNOSIS — E8779 Other fluid overload: Secondary | ICD-10-CM | POA: Diagnosis not present

## 2022-04-27 DIAGNOSIS — D509 Iron deficiency anemia, unspecified: Secondary | ICD-10-CM | POA: Diagnosis not present

## 2022-04-29 DIAGNOSIS — N186 End stage renal disease: Secondary | ICD-10-CM | POA: Diagnosis not present

## 2022-04-29 DIAGNOSIS — D509 Iron deficiency anemia, unspecified: Secondary | ICD-10-CM | POA: Diagnosis not present

## 2022-04-29 DIAGNOSIS — E8779 Other fluid overload: Secondary | ICD-10-CM | POA: Diagnosis not present

## 2022-04-29 DIAGNOSIS — N2581 Secondary hyperparathyroidism of renal origin: Secondary | ICD-10-CM | POA: Diagnosis not present

## 2022-04-30 DIAGNOSIS — E8779 Other fluid overload: Secondary | ICD-10-CM | POA: Diagnosis not present

## 2022-04-30 DIAGNOSIS — N2581 Secondary hyperparathyroidism of renal origin: Secondary | ICD-10-CM | POA: Diagnosis not present

## 2022-04-30 DIAGNOSIS — D509 Iron deficiency anemia, unspecified: Secondary | ICD-10-CM | POA: Diagnosis not present

## 2022-04-30 DIAGNOSIS — N186 End stage renal disease: Secondary | ICD-10-CM | POA: Diagnosis not present

## 2022-05-04 DIAGNOSIS — E8779 Other fluid overload: Secondary | ICD-10-CM | POA: Diagnosis not present

## 2022-05-04 DIAGNOSIS — N186 End stage renal disease: Secondary | ICD-10-CM | POA: Diagnosis not present

## 2022-05-04 DIAGNOSIS — D509 Iron deficiency anemia, unspecified: Secondary | ICD-10-CM | POA: Diagnosis not present

## 2022-05-04 DIAGNOSIS — N2581 Secondary hyperparathyroidism of renal origin: Secondary | ICD-10-CM | POA: Diagnosis not present

## 2022-05-06 DIAGNOSIS — E8779 Other fluid overload: Secondary | ICD-10-CM | POA: Diagnosis not present

## 2022-05-06 DIAGNOSIS — N2581 Secondary hyperparathyroidism of renal origin: Secondary | ICD-10-CM | POA: Diagnosis not present

## 2022-05-06 DIAGNOSIS — N186 End stage renal disease: Secondary | ICD-10-CM | POA: Diagnosis not present

## 2022-05-06 DIAGNOSIS — D509 Iron deficiency anemia, unspecified: Secondary | ICD-10-CM | POA: Diagnosis not present

## 2022-05-09 DIAGNOSIS — N2581 Secondary hyperparathyroidism of renal origin: Secondary | ICD-10-CM | POA: Diagnosis not present

## 2022-05-09 DIAGNOSIS — E8779 Other fluid overload: Secondary | ICD-10-CM | POA: Diagnosis not present

## 2022-05-09 DIAGNOSIS — D509 Iron deficiency anemia, unspecified: Secondary | ICD-10-CM | POA: Diagnosis not present

## 2022-05-09 DIAGNOSIS — N186 End stage renal disease: Secondary | ICD-10-CM | POA: Diagnosis not present

## 2022-05-10 DIAGNOSIS — E8779 Other fluid overload: Secondary | ICD-10-CM | POA: Diagnosis not present

## 2022-05-10 DIAGNOSIS — N2581 Secondary hyperparathyroidism of renal origin: Secondary | ICD-10-CM | POA: Diagnosis not present

## 2022-05-10 DIAGNOSIS — D509 Iron deficiency anemia, unspecified: Secondary | ICD-10-CM | POA: Diagnosis not present

## 2022-05-10 DIAGNOSIS — N186 End stage renal disease: Secondary | ICD-10-CM | POA: Diagnosis not present

## 2022-05-13 DIAGNOSIS — E8779 Other fluid overload: Secondary | ICD-10-CM | POA: Diagnosis not present

## 2022-05-13 DIAGNOSIS — D509 Iron deficiency anemia, unspecified: Secondary | ICD-10-CM | POA: Diagnosis not present

## 2022-05-13 DIAGNOSIS — N186 End stage renal disease: Secondary | ICD-10-CM | POA: Diagnosis not present

## 2022-05-13 DIAGNOSIS — N2581 Secondary hyperparathyroidism of renal origin: Secondary | ICD-10-CM | POA: Diagnosis not present

## 2022-05-16 ENCOUNTER — Other Ambulatory Visit: Payer: Self-pay | Admitting: Internal Medicine

## 2022-05-16 DIAGNOSIS — N2581 Secondary hyperparathyroidism of renal origin: Secondary | ICD-10-CM | POA: Diagnosis not present

## 2022-05-16 DIAGNOSIS — F331 Major depressive disorder, recurrent, moderate: Secondary | ICD-10-CM

## 2022-05-16 DIAGNOSIS — N186 End stage renal disease: Secondary | ICD-10-CM | POA: Diagnosis not present

## 2022-05-16 DIAGNOSIS — D509 Iron deficiency anemia, unspecified: Secondary | ICD-10-CM | POA: Diagnosis not present

## 2022-05-16 DIAGNOSIS — E8779 Other fluid overload: Secondary | ICD-10-CM | POA: Diagnosis not present

## 2022-05-16 DIAGNOSIS — F418 Other specified anxiety disorders: Secondary | ICD-10-CM

## 2022-05-19 DIAGNOSIS — D509 Iron deficiency anemia, unspecified: Secondary | ICD-10-CM | POA: Diagnosis not present

## 2022-05-19 DIAGNOSIS — N2581 Secondary hyperparathyroidism of renal origin: Secondary | ICD-10-CM | POA: Diagnosis not present

## 2022-05-19 DIAGNOSIS — D631 Anemia in chronic kidney disease: Secondary | ICD-10-CM | POA: Diagnosis not present

## 2022-05-19 DIAGNOSIS — N186 End stage renal disease: Secondary | ICD-10-CM | POA: Diagnosis not present

## 2022-05-19 DIAGNOSIS — E8779 Other fluid overload: Secondary | ICD-10-CM | POA: Diagnosis not present

## 2022-05-23 DIAGNOSIS — D509 Iron deficiency anemia, unspecified: Secondary | ICD-10-CM | POA: Diagnosis not present

## 2022-05-23 DIAGNOSIS — D631 Anemia in chronic kidney disease: Secondary | ICD-10-CM | POA: Diagnosis not present

## 2022-05-23 DIAGNOSIS — E8779 Other fluid overload: Secondary | ICD-10-CM | POA: Diagnosis not present

## 2022-05-23 DIAGNOSIS — N186 End stage renal disease: Secondary | ICD-10-CM | POA: Diagnosis not present

## 2022-05-23 DIAGNOSIS — N2581 Secondary hyperparathyroidism of renal origin: Secondary | ICD-10-CM | POA: Diagnosis not present

## 2022-05-24 ENCOUNTER — Other Ambulatory Visit: Payer: Self-pay | Admitting: Internal Medicine

## 2022-05-24 DIAGNOSIS — E1042 Type 1 diabetes mellitus with diabetic polyneuropathy: Secondary | ICD-10-CM

## 2022-05-24 DIAGNOSIS — F331 Major depressive disorder, recurrent, moderate: Secondary | ICD-10-CM

## 2022-05-24 DIAGNOSIS — F418 Other specified anxiety disorders: Secondary | ICD-10-CM

## 2022-05-25 DIAGNOSIS — D631 Anemia in chronic kidney disease: Secondary | ICD-10-CM | POA: Diagnosis not present

## 2022-05-25 DIAGNOSIS — D509 Iron deficiency anemia, unspecified: Secondary | ICD-10-CM | POA: Diagnosis not present

## 2022-05-25 DIAGNOSIS — N2581 Secondary hyperparathyroidism of renal origin: Secondary | ICD-10-CM | POA: Diagnosis not present

## 2022-05-25 DIAGNOSIS — E1029 Type 1 diabetes mellitus with other diabetic kidney complication: Secondary | ICD-10-CM | POA: Diagnosis not present

## 2022-05-25 DIAGNOSIS — E8779 Other fluid overload: Secondary | ICD-10-CM | POA: Diagnosis not present

## 2022-05-25 DIAGNOSIS — N186 End stage renal disease: Secondary | ICD-10-CM | POA: Diagnosis not present

## 2022-05-27 DIAGNOSIS — N186 End stage renal disease: Secondary | ICD-10-CM | POA: Diagnosis not present

## 2022-05-27 DIAGNOSIS — D631 Anemia in chronic kidney disease: Secondary | ICD-10-CM | POA: Diagnosis not present

## 2022-05-27 DIAGNOSIS — N2581 Secondary hyperparathyroidism of renal origin: Secondary | ICD-10-CM | POA: Diagnosis not present

## 2022-05-27 DIAGNOSIS — D509 Iron deficiency anemia, unspecified: Secondary | ICD-10-CM | POA: Diagnosis not present

## 2022-05-27 DIAGNOSIS — E8779 Other fluid overload: Secondary | ICD-10-CM | POA: Diagnosis not present

## 2022-05-29 ENCOUNTER — Other Ambulatory Visit: Payer: Self-pay | Admitting: Internal Medicine

## 2022-05-29 DIAGNOSIS — E1029 Type 1 diabetes mellitus with other diabetic kidney complication: Secondary | ICD-10-CM

## 2022-06-01 DIAGNOSIS — N2581 Secondary hyperparathyroidism of renal origin: Secondary | ICD-10-CM | POA: Diagnosis not present

## 2022-06-01 DIAGNOSIS — D509 Iron deficiency anemia, unspecified: Secondary | ICD-10-CM | POA: Diagnosis not present

## 2022-06-01 DIAGNOSIS — D631 Anemia in chronic kidney disease: Secondary | ICD-10-CM | POA: Diagnosis not present

## 2022-06-01 DIAGNOSIS — E8779 Other fluid overload: Secondary | ICD-10-CM | POA: Diagnosis not present

## 2022-06-01 DIAGNOSIS — N186 End stage renal disease: Secondary | ICD-10-CM | POA: Diagnosis not present

## 2022-06-02 DIAGNOSIS — D509 Iron deficiency anemia, unspecified: Secondary | ICD-10-CM | POA: Diagnosis not present

## 2022-06-02 DIAGNOSIS — N186 End stage renal disease: Secondary | ICD-10-CM | POA: Diagnosis not present

## 2022-06-02 DIAGNOSIS — D631 Anemia in chronic kidney disease: Secondary | ICD-10-CM | POA: Diagnosis not present

## 2022-06-02 DIAGNOSIS — N2581 Secondary hyperparathyroidism of renal origin: Secondary | ICD-10-CM | POA: Diagnosis not present

## 2022-06-02 DIAGNOSIS — E8779 Other fluid overload: Secondary | ICD-10-CM | POA: Diagnosis not present

## 2022-06-03 DIAGNOSIS — D509 Iron deficiency anemia, unspecified: Secondary | ICD-10-CM | POA: Diagnosis not present

## 2022-06-03 DIAGNOSIS — N186 End stage renal disease: Secondary | ICD-10-CM | POA: Diagnosis not present

## 2022-06-03 DIAGNOSIS — N2581 Secondary hyperparathyroidism of renal origin: Secondary | ICD-10-CM | POA: Diagnosis not present

## 2022-06-03 DIAGNOSIS — E8779 Other fluid overload: Secondary | ICD-10-CM | POA: Diagnosis not present

## 2022-06-03 DIAGNOSIS — D631 Anemia in chronic kidney disease: Secondary | ICD-10-CM | POA: Diagnosis not present

## 2022-06-04 DIAGNOSIS — E1065 Type 1 diabetes mellitus with hyperglycemia: Secondary | ICD-10-CM | POA: Diagnosis not present

## 2022-06-06 DIAGNOSIS — D509 Iron deficiency anemia, unspecified: Secondary | ICD-10-CM | POA: Diagnosis not present

## 2022-06-06 DIAGNOSIS — E8779 Other fluid overload: Secondary | ICD-10-CM | POA: Diagnosis not present

## 2022-06-06 DIAGNOSIS — N2581 Secondary hyperparathyroidism of renal origin: Secondary | ICD-10-CM | POA: Diagnosis not present

## 2022-06-06 DIAGNOSIS — N186 End stage renal disease: Secondary | ICD-10-CM | POA: Diagnosis not present

## 2022-06-06 DIAGNOSIS — D631 Anemia in chronic kidney disease: Secondary | ICD-10-CM | POA: Diagnosis not present

## 2022-06-08 DIAGNOSIS — D631 Anemia in chronic kidney disease: Secondary | ICD-10-CM | POA: Diagnosis not present

## 2022-06-08 DIAGNOSIS — D509 Iron deficiency anemia, unspecified: Secondary | ICD-10-CM | POA: Diagnosis not present

## 2022-06-08 DIAGNOSIS — N186 End stage renal disease: Secondary | ICD-10-CM | POA: Diagnosis not present

## 2022-06-08 DIAGNOSIS — E8779 Other fluid overload: Secondary | ICD-10-CM | POA: Diagnosis not present

## 2022-06-08 DIAGNOSIS — N2581 Secondary hyperparathyroidism of renal origin: Secondary | ICD-10-CM | POA: Diagnosis not present

## 2022-06-10 DIAGNOSIS — D631 Anemia in chronic kidney disease: Secondary | ICD-10-CM | POA: Diagnosis not present

## 2022-06-10 DIAGNOSIS — D509 Iron deficiency anemia, unspecified: Secondary | ICD-10-CM | POA: Diagnosis not present

## 2022-06-10 DIAGNOSIS — E8779 Other fluid overload: Secondary | ICD-10-CM | POA: Diagnosis not present

## 2022-06-10 DIAGNOSIS — N2581 Secondary hyperparathyroidism of renal origin: Secondary | ICD-10-CM | POA: Diagnosis not present

## 2022-06-10 DIAGNOSIS — N186 End stage renal disease: Secondary | ICD-10-CM | POA: Diagnosis not present

## 2022-06-13 DIAGNOSIS — N2581 Secondary hyperparathyroidism of renal origin: Secondary | ICD-10-CM | POA: Diagnosis not present

## 2022-06-13 DIAGNOSIS — D631 Anemia in chronic kidney disease: Secondary | ICD-10-CM | POA: Diagnosis not present

## 2022-06-13 DIAGNOSIS — N186 End stage renal disease: Secondary | ICD-10-CM | POA: Diagnosis not present

## 2022-06-13 DIAGNOSIS — E8779 Other fluid overload: Secondary | ICD-10-CM | POA: Diagnosis not present

## 2022-06-13 DIAGNOSIS — D509 Iron deficiency anemia, unspecified: Secondary | ICD-10-CM | POA: Diagnosis not present

## 2022-06-15 DIAGNOSIS — D631 Anemia in chronic kidney disease: Secondary | ICD-10-CM | POA: Diagnosis not present

## 2022-06-15 DIAGNOSIS — N2581 Secondary hyperparathyroidism of renal origin: Secondary | ICD-10-CM | POA: Diagnosis not present

## 2022-06-15 DIAGNOSIS — E8779 Other fluid overload: Secondary | ICD-10-CM | POA: Diagnosis not present

## 2022-06-15 DIAGNOSIS — D509 Iron deficiency anemia, unspecified: Secondary | ICD-10-CM | POA: Diagnosis not present

## 2022-06-15 DIAGNOSIS — N186 End stage renal disease: Secondary | ICD-10-CM | POA: Diagnosis not present

## 2022-06-17 ENCOUNTER — Other Ambulatory Visit: Payer: Self-pay | Admitting: Internal Medicine

## 2022-06-17 ENCOUNTER — Encounter: Payer: Self-pay | Admitting: *Deleted

## 2022-06-17 DIAGNOSIS — N2581 Secondary hyperparathyroidism of renal origin: Secondary | ICD-10-CM | POA: Diagnosis not present

## 2022-06-17 DIAGNOSIS — Z992 Dependence on renal dialysis: Secondary | ICD-10-CM | POA: Diagnosis not present

## 2022-06-17 DIAGNOSIS — E8779 Other fluid overload: Secondary | ICD-10-CM | POA: Diagnosis not present

## 2022-06-17 DIAGNOSIS — D631 Anemia in chronic kidney disease: Secondary | ICD-10-CM | POA: Diagnosis not present

## 2022-06-17 DIAGNOSIS — E1029 Type 1 diabetes mellitus with other diabetic kidney complication: Secondary | ICD-10-CM

## 2022-06-17 DIAGNOSIS — N186 End stage renal disease: Secondary | ICD-10-CM | POA: Diagnosis not present

## 2022-06-17 DIAGNOSIS — E1042 Type 1 diabetes mellitus with diabetic polyneuropathy: Secondary | ICD-10-CM

## 2022-06-17 DIAGNOSIS — D509 Iron deficiency anemia, unspecified: Secondary | ICD-10-CM | POA: Diagnosis not present

## 2022-06-17 NOTE — Telephone Encounter (Signed)
Message left to call and schedule appt prior to refills.

## 2022-06-17 NOTE — Telephone Encounter (Signed)
Requested medication (s) are due for refill today: in 6 days  Requested medication (s) are on the active medication list: yes  Last refill:  Novolog 11/01/21 34ml with 5 RF, Gabapentin 02/10/22 #360 with 0 RF  Future visit scheduled: no, last seen in office via video 10/21/2021, no upcoming visit.  Notes to clinic:  HgA1C is out of date from 12/2020, already had curtesy refills, I have called pt and left message as well as sent MyChart message and no appt made, please assess.      Requested Prescriptions  Pending Prescriptions Disp Refills   NOVOLOG 100 UNIT/ML injection [Pharmacy Med Name: Novolog U-100 Insulin aspart 100 unit/mL subcutaneous solution] 10 mL 5    Sig: USE AS DIRECTED in insulin pump     Endocrinology:  Diabetes - Insulins Failed - 06/17/2022  9:26 AM      Failed - HBA1C is between 0 and 7.9 and within 180 days    HbA1c, POC (controlled diabetic range)  Date Value Ref Range Status  12/28/2020 10.2 (A) 0.0 - 7.0 % Final   Hgb A1c MFr Bld  Date Value Ref Range Status  10/15/2021 8.3 (H) 4.8 - 5.6 % Final    Comment:    (NOTE) Pre diabetes:          5.7%-6.4%  Diabetes:              >6.4%  Glycemic control for   <7.0% adults with diabetes          Failed - Valid encounter within last 6 months    Recent Outpatient Visits           7 months ago Hospital discharge follow-up   [place], Deborah B, MD   10 months ago Bronchitis with asthma, acute   [place], Deborah B, MD   1 year ago [place], [place], [place]   1 year ago Other insomnia   [place], Neoma Laming B, MD   1 year ago 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, with long-term current use of insulin ([place])   [place], [name], MD               gabapentin (NEURONTIN) 300 MG  capsule [Pharmacy Med Name: gabapentin 300 mg capsule] 120 capsule 0    Sig: TAKE TWO CAPSULES BY MOUTH EVERY MORNING and TAKE TWO CAPSULES BY MOUTH EVERYDAY AT BEDTIME     Neurology: Anticonvulsants - gabapentin Failed - 06/17/2022  9:26 AM      Failed - Cr in normal range and within 360 days    Creat  Date Value Ref Range Status  02/04/2016 2.31 (H) 0.50 - 1.10 mg/dL Final   Creatinine, Ser  Date Value Ref Range Status  04/05/2022 4.80 (H) 0.44 - 1.00 mg/dL Final   Creatinine, POC  Date Value Ref Range Status  06/23/2017 200 mg/dL Final   Creatinine, Urine  Date Value Ref Range Status  02/04/2016 120 20 - 320 mg/dL Final         Passed - Completed PHQ-2 or PHQ-9 in the last 360 days      Passed - Valid encounter within last 12 months    Recent Outpatient Visits           7 months ago Hospital discharge follow-up   [facility]  [place], [place]   10 months ago Bronchitis with asthma, acute   [place] [name], MD   1 year ago [place], [place], [place]   1 year ago Other insomnia   [place], Deborah B, MD   1 year ago 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

## 2022-06-20 DIAGNOSIS — D509 Iron deficiency anemia, unspecified: Secondary | ICD-10-CM | POA: Diagnosis not present

## 2022-06-20 DIAGNOSIS — N2581 Secondary hyperparathyroidism of renal origin: Secondary | ICD-10-CM | POA: Diagnosis not present

## 2022-06-20 DIAGNOSIS — N186 End stage renal disease: Secondary | ICD-10-CM | POA: Diagnosis not present

## 2022-06-22 ENCOUNTER — Other Ambulatory Visit: Payer: Self-pay | Admitting: Internal Medicine

## 2022-06-22 DIAGNOSIS — N186 End stage renal disease: Secondary | ICD-10-CM | POA: Diagnosis not present

## 2022-06-22 DIAGNOSIS — E1029 Type 1 diabetes mellitus with other diabetic kidney complication: Secondary | ICD-10-CM

## 2022-06-22 DIAGNOSIS — N2581 Secondary hyperparathyroidism of renal origin: Secondary | ICD-10-CM | POA: Diagnosis not present

## 2022-06-22 DIAGNOSIS — E1042 Type 1 diabetes mellitus with diabetic polyneuropathy: Secondary | ICD-10-CM

## 2022-06-22 DIAGNOSIS — D509 Iron deficiency anemia, unspecified: Secondary | ICD-10-CM | POA: Diagnosis not present

## 2022-06-24 DIAGNOSIS — D509 Iron deficiency anemia, unspecified: Secondary | ICD-10-CM | POA: Diagnosis not present

## 2022-06-24 DIAGNOSIS — N186 End stage renal disease: Secondary | ICD-10-CM | POA: Diagnosis not present

## 2022-06-24 DIAGNOSIS — N2581 Secondary hyperparathyroidism of renal origin: Secondary | ICD-10-CM | POA: Diagnosis not present

## 2022-06-27 DIAGNOSIS — N186 End stage renal disease: Secondary | ICD-10-CM | POA: Diagnosis not present

## 2022-06-27 DIAGNOSIS — D509 Iron deficiency anemia, unspecified: Secondary | ICD-10-CM | POA: Diagnosis not present

## 2022-06-27 DIAGNOSIS — N2581 Secondary hyperparathyroidism of renal origin: Secondary | ICD-10-CM | POA: Diagnosis not present

## 2022-06-29 DIAGNOSIS — N2581 Secondary hyperparathyroidism of renal origin: Secondary | ICD-10-CM | POA: Diagnosis not present

## 2022-06-29 DIAGNOSIS — D509 Iron deficiency anemia, unspecified: Secondary | ICD-10-CM | POA: Diagnosis not present

## 2022-06-29 DIAGNOSIS — N186 End stage renal disease: Secondary | ICD-10-CM | POA: Diagnosis not present

## 2022-07-01 DIAGNOSIS — D509 Iron deficiency anemia, unspecified: Secondary | ICD-10-CM | POA: Diagnosis not present

## 2022-07-01 DIAGNOSIS — N186 End stage renal disease: Secondary | ICD-10-CM | POA: Diagnosis not present

## 2022-07-01 DIAGNOSIS — N2581 Secondary hyperparathyroidism of renal origin: Secondary | ICD-10-CM | POA: Diagnosis not present

## 2022-07-04 DIAGNOSIS — N186 End stage renal disease: Secondary | ICD-10-CM | POA: Diagnosis not present

## 2022-07-04 DIAGNOSIS — N2581 Secondary hyperparathyroidism of renal origin: Secondary | ICD-10-CM | POA: Diagnosis not present

## 2022-07-04 DIAGNOSIS — D509 Iron deficiency anemia, unspecified: Secondary | ICD-10-CM | POA: Diagnosis not present

## 2022-07-06 DIAGNOSIS — D509 Iron deficiency anemia, unspecified: Secondary | ICD-10-CM | POA: Diagnosis not present

## 2022-07-06 DIAGNOSIS — N2581 Secondary hyperparathyroidism of renal origin: Secondary | ICD-10-CM | POA: Diagnosis not present

## 2022-07-06 DIAGNOSIS — N186 End stage renal disease: Secondary | ICD-10-CM | POA: Diagnosis not present

## 2022-07-08 DIAGNOSIS — N186 End stage renal disease: Secondary | ICD-10-CM | POA: Diagnosis not present

## 2022-07-08 DIAGNOSIS — D509 Iron deficiency anemia, unspecified: Secondary | ICD-10-CM | POA: Diagnosis not present

## 2022-07-08 DIAGNOSIS — N2581 Secondary hyperparathyroidism of renal origin: Secondary | ICD-10-CM | POA: Diagnosis not present

## 2022-07-11 DIAGNOSIS — N2581 Secondary hyperparathyroidism of renal origin: Secondary | ICD-10-CM | POA: Diagnosis not present

## 2022-07-11 DIAGNOSIS — N186 End stage renal disease: Secondary | ICD-10-CM | POA: Diagnosis not present

## 2022-07-11 DIAGNOSIS — D509 Iron deficiency anemia, unspecified: Secondary | ICD-10-CM | POA: Diagnosis not present

## 2022-07-12 ENCOUNTER — Other Ambulatory Visit: Payer: Self-pay | Admitting: Internal Medicine

## 2022-07-12 DIAGNOSIS — I10 Essential (primary) hypertension: Secondary | ICD-10-CM

## 2022-07-13 DIAGNOSIS — D509 Iron deficiency anemia, unspecified: Secondary | ICD-10-CM | POA: Diagnosis not present

## 2022-07-13 DIAGNOSIS — N2581 Secondary hyperparathyroidism of renal origin: Secondary | ICD-10-CM | POA: Diagnosis not present

## 2022-07-13 DIAGNOSIS — N186 End stage renal disease: Secondary | ICD-10-CM | POA: Diagnosis not present

## 2022-07-15 DIAGNOSIS — D509 Iron deficiency anemia, unspecified: Secondary | ICD-10-CM | POA: Diagnosis not present

## 2022-07-15 DIAGNOSIS — N2581 Secondary hyperparathyroidism of renal origin: Secondary | ICD-10-CM | POA: Diagnosis not present

## 2022-07-15 DIAGNOSIS — N186 End stage renal disease: Secondary | ICD-10-CM | POA: Diagnosis not present

## 2022-07-18 DIAGNOSIS — N186 End stage renal disease: Secondary | ICD-10-CM | POA: Diagnosis not present

## 2022-07-18 DIAGNOSIS — D509 Iron deficiency anemia, unspecified: Secondary | ICD-10-CM | POA: Diagnosis not present

## 2022-07-18 DIAGNOSIS — N2581 Secondary hyperparathyroidism of renal origin: Secondary | ICD-10-CM | POA: Diagnosis not present

## 2022-07-18 DIAGNOSIS — Z992 Dependence on renal dialysis: Secondary | ICD-10-CM | POA: Diagnosis not present

## 2022-07-19 DIAGNOSIS — D631 Anemia in chronic kidney disease: Secondary | ICD-10-CM | POA: Diagnosis not present

## 2022-07-19 DIAGNOSIS — E8779 Other fluid overload: Secondary | ICD-10-CM | POA: Diagnosis not present

## 2022-07-19 DIAGNOSIS — N2581 Secondary hyperparathyroidism of renal origin: Secondary | ICD-10-CM | POA: Diagnosis not present

## 2022-07-19 DIAGNOSIS — E1029 Type 1 diabetes mellitus with other diabetic kidney complication: Secondary | ICD-10-CM | POA: Diagnosis not present

## 2022-07-19 DIAGNOSIS — N186 End stage renal disease: Secondary | ICD-10-CM | POA: Diagnosis not present

## 2022-07-20 DIAGNOSIS — E1121 Type 2 diabetes mellitus with diabetic nephropathy: Secondary | ICD-10-CM | POA: Diagnosis not present

## 2022-07-20 DIAGNOSIS — E8779 Other fluid overload: Secondary | ICD-10-CM | POA: Diagnosis not present

## 2022-07-20 DIAGNOSIS — D631 Anemia in chronic kidney disease: Secondary | ICD-10-CM | POA: Diagnosis not present

## 2022-07-20 DIAGNOSIS — N186 End stage renal disease: Secondary | ICD-10-CM | POA: Diagnosis not present

## 2022-07-20 DIAGNOSIS — N2581 Secondary hyperparathyroidism of renal origin: Secondary | ICD-10-CM | POA: Diagnosis not present

## 2022-07-21 ENCOUNTER — Other Ambulatory Visit: Payer: Self-pay | Admitting: Internal Medicine

## 2022-07-21 DIAGNOSIS — I10 Essential (primary) hypertension: Secondary | ICD-10-CM

## 2022-07-22 DIAGNOSIS — E8779 Other fluid overload: Secondary | ICD-10-CM | POA: Diagnosis not present

## 2022-07-22 DIAGNOSIS — D631 Anemia in chronic kidney disease: Secondary | ICD-10-CM | POA: Diagnosis not present

## 2022-07-22 DIAGNOSIS — N2581 Secondary hyperparathyroidism of renal origin: Secondary | ICD-10-CM | POA: Diagnosis not present

## 2022-07-22 DIAGNOSIS — N186 End stage renal disease: Secondary | ICD-10-CM | POA: Diagnosis not present

## 2022-07-25 DIAGNOSIS — N186 End stage renal disease: Secondary | ICD-10-CM | POA: Diagnosis not present

## 2022-07-25 DIAGNOSIS — D631 Anemia in chronic kidney disease: Secondary | ICD-10-CM | POA: Diagnosis not present

## 2022-07-25 DIAGNOSIS — N2581 Secondary hyperparathyroidism of renal origin: Secondary | ICD-10-CM | POA: Diagnosis not present

## 2022-07-25 DIAGNOSIS — E8779 Other fluid overload: Secondary | ICD-10-CM | POA: Diagnosis not present

## 2022-07-27 DIAGNOSIS — E8779 Other fluid overload: Secondary | ICD-10-CM | POA: Diagnosis not present

## 2022-07-27 DIAGNOSIS — N186 End stage renal disease: Secondary | ICD-10-CM | POA: Diagnosis not present

## 2022-07-27 DIAGNOSIS — D631 Anemia in chronic kidney disease: Secondary | ICD-10-CM | POA: Diagnosis not present

## 2022-07-27 DIAGNOSIS — N2581 Secondary hyperparathyroidism of renal origin: Secondary | ICD-10-CM | POA: Diagnosis not present

## 2022-07-28 ENCOUNTER — Other Ambulatory Visit: Payer: Self-pay | Admitting: Internal Medicine

## 2022-07-28 DIAGNOSIS — I10 Essential (primary) hypertension: Secondary | ICD-10-CM

## 2022-07-29 DIAGNOSIS — N2581 Secondary hyperparathyroidism of renal origin: Secondary | ICD-10-CM | POA: Diagnosis not present

## 2022-07-29 DIAGNOSIS — N186 End stage renal disease: Secondary | ICD-10-CM | POA: Diagnosis not present

## 2022-07-29 DIAGNOSIS — D631 Anemia in chronic kidney disease: Secondary | ICD-10-CM | POA: Diagnosis not present

## 2022-07-29 DIAGNOSIS — E8779 Other fluid overload: Secondary | ICD-10-CM | POA: Diagnosis not present

## 2022-08-01 ENCOUNTER — Encounter (HOSPITAL_COMMUNITY): Payer: Self-pay

## 2022-08-01 ENCOUNTER — Emergency Department (HOSPITAL_COMMUNITY)
Admission: EM | Admit: 2022-08-01 | Discharge: 2022-08-02 | Disposition: A | Discharge status: Home / Self Care | Payer: Medicaid Other | Attending: Emergency Medicine | Admitting: Emergency Medicine

## 2022-08-01 ENCOUNTER — Emergency Department (HOSPITAL_COMMUNITY): Payer: Medicaid Other

## 2022-08-01 ENCOUNTER — Other Ambulatory Visit: Payer: Self-pay

## 2022-08-01 DIAGNOSIS — Z79899 Other long term (current) drug therapy: Secondary | ICD-10-CM | POA: Diagnosis not present

## 2022-08-01 DIAGNOSIS — Z794 Long term (current) use of insulin: Secondary | ICD-10-CM | POA: Diagnosis not present

## 2022-08-01 DIAGNOSIS — R0789 Other chest pain: Secondary | ICD-10-CM | POA: Diagnosis not present

## 2022-08-01 DIAGNOSIS — R079 Chest pain, unspecified: Secondary | ICD-10-CM | POA: Diagnosis not present

## 2022-08-01 DIAGNOSIS — R1011 Right upper quadrant pain: Secondary | ICD-10-CM | POA: Diagnosis not present

## 2022-08-01 DIAGNOSIS — J9811 Atelectasis: Secondary | ICD-10-CM | POA: Diagnosis not present

## 2022-08-01 DIAGNOSIS — E1022 Type 1 diabetes mellitus with diabetic chronic kidney disease: Secondary | ICD-10-CM | POA: Diagnosis not present

## 2022-08-01 DIAGNOSIS — Z20822 Contact with and (suspected) exposure to covid-19: Secondary | ICD-10-CM | POA: Insufficient documentation

## 2022-08-01 DIAGNOSIS — E1040 Type 1 diabetes mellitus with diabetic neuropathy, unspecified: Secondary | ICD-10-CM | POA: Insufficient documentation

## 2022-08-01 DIAGNOSIS — I10 Essential (primary) hypertension: Secondary | ICD-10-CM | POA: Diagnosis not present

## 2022-08-01 DIAGNOSIS — I120 Hypertensive chronic kidney disease with stage 5 chronic kidney disease or end stage renal disease: Secondary | ICD-10-CM | POA: Diagnosis not present

## 2022-08-01 DIAGNOSIS — E8779 Other fluid overload: Secondary | ICD-10-CM | POA: Diagnosis not present

## 2022-08-01 DIAGNOSIS — D631 Anemia in chronic kidney disease: Secondary | ICD-10-CM | POA: Diagnosis not present

## 2022-08-01 DIAGNOSIS — N186 End stage renal disease: Secondary | ICD-10-CM | POA: Diagnosis not present

## 2022-08-01 DIAGNOSIS — N2581 Secondary hyperparathyroidism of renal origin: Secondary | ICD-10-CM | POA: Diagnosis not present

## 2022-08-01 DIAGNOSIS — D649 Anemia, unspecified: Secondary | ICD-10-CM | POA: Diagnosis not present

## 2022-08-01 DIAGNOSIS — Z992 Dependence on renal dialysis: Secondary | ICD-10-CM | POA: Insufficient documentation

## 2022-08-01 DIAGNOSIS — R55 Syncope and collapse: Secondary | ICD-10-CM | POA: Diagnosis not present

## 2022-08-01 LAB — CBC WITH DIFFERENTIAL/PLATELET
Abs Immature Granulocytes: 0.05 10*3/uL (ref 0.00–0.07)
Basophils Absolute: 0 10*3/uL (ref 0.0–0.1)
Basophils Relative: 1 %
Eosinophils Absolute: 0.7 10*3/uL — ABNORMAL HIGH (ref 0.0–0.5)
Eosinophils Relative: 8 %
HCT: 25.6 % — ABNORMAL LOW (ref 36.0–46.0)
Hemoglobin: 8.4 g/dL — ABNORMAL LOW (ref 12.0–15.0)
Immature Granulocytes: 1 %
Lymphocytes Relative: 15 %
Lymphs Abs: 1.2 10*3/uL (ref 0.7–4.0)
MCH: 30.2 pg (ref 26.0–34.0)
MCHC: 32.8 g/dL (ref 30.0–36.0)
MCV: 92.1 fL (ref 80.0–100.0)
Monocytes Absolute: 0.8 10*3/uL (ref 0.1–1.0)
Monocytes Relative: 10 %
Neutro Abs: 5.3 10*3/uL (ref 1.7–7.7)
Neutrophils Relative %: 65 %
Platelets: 119 10*3/uL — ABNORMAL LOW (ref 150–400)
RBC: 2.78 MIL/uL — ABNORMAL LOW (ref 3.87–5.11)
RDW: 12.8 % (ref 11.5–15.5)
WBC: 8.1 10*3/uL (ref 4.0–10.5)
nRBC: 0 % (ref 0.0–0.2)

## 2022-08-01 LAB — COMPREHENSIVE METABOLIC PANEL
ALT: 24 U/L (ref 0–44)
AST: 35 U/L (ref 15–41)
Albumin: 3.6 g/dL (ref 3.5–5.0)
Alkaline Phosphatase: 145 U/L — ABNORMAL HIGH (ref 38–126)
Anion gap: 8 (ref 5–15)
BUN: 18 mg/dL (ref 6–20)
CO2: 30 mmol/L (ref 22–32)
Calcium: 9.5 mg/dL (ref 8.9–10.3)
Chloride: 97 mmol/L — ABNORMAL LOW (ref 98–111)
Creatinine, Ser: 6.06 mg/dL — ABNORMAL HIGH (ref 0.44–1.00)
GFR, Estimated: 9 mL/min — ABNORMAL LOW (ref >60)
Glucose, Bld: 178 mg/dL — ABNORMAL HIGH (ref 70–99)
Potassium: 4 mmol/L (ref 3.5–5.1)
Sodium: 135 mmol/L (ref 135–145)
Total Bilirubin: 0.5 mg/dL (ref 0.3–1.2)
Total Protein: 7.1 g/dL (ref 6.5–8.1)

## 2022-08-01 LAB — TROPONIN I (HIGH SENSITIVITY)
Troponin I (High Sensitivity): 12 ng/L (ref <18)
Troponin I (High Sensitivity): 14 ng/L (ref <18)

## 2022-08-01 LAB — LIPASE, BLOOD: Lipase: 34 U/L (ref 11–51)

## 2022-08-01 LAB — SARS CORONAVIRUS 2 BY RT PCR: SARS Coronavirus 2 by RT PCR: NEGATIVE

## 2022-08-01 LAB — CBG MONITORING, ED
Glucose-Capillary: 227 mg/dL — ABNORMAL HIGH (ref 70–99)
Glucose-Capillary: 275 mg/dL — ABNORMAL HIGH (ref 70–99)

## 2022-08-01 LAB — I-STAT BETA HCG BLOOD, ED (MC, WL, AP ONLY): I-stat hCG, quantitative: <5 m[IU]/mL (ref <5)

## 2022-08-01 MED ORDER — IOHEXOL 350 MG/ML SOLN
63.0000 mL | Freq: Once | INTRAVENOUS | Status: AC | PRN
  Administered 2022-08-01: 63 mL via INTRAVENOUS

## 2022-08-01 NOTE — ED Provider Triage Note (Signed)
Emergency Medicine Provider Triage Evaluation Note  Angela [name] , a 36 y.o. female  was evaluated in triage.  Pt complains of syncopal episode, chest pain, and shortness of breath.  Patient reports that last Tuesday or Wednesday she started having chest tightness.  Tightness is throughout her entire chest and back.  Patient endorses associated shortness of breath with this.  Patient states that today she began to feel "woozy," and then had a syncopal episode.  Patient reports that she was breathing fast and felt like she was very short of breath prior to her syncopal episode.  Patient denies falling or hitting her head after syncopal episode.  Patient is dialysis, Monday, Wednesday, and Friday.  Patient reports that she has not missed any doses however has had shorter treatment times recently.  Patient reports that she has missed doses of her antihypertensive medications.  Patient denies missing any medications associated to her diabetes or Keppra.  Review of Systems  Positive: Chest pain, shortness of breath, lightheadedness, syncope Negative: Palpitations, hemoptysis, leg swelling or tenderness.  Physical Exam  BP (!) 165/101   Pulse 95   Temp 98.2 F (36.8 C)   Resp 18   Ht 5\' 5"  (1.651 m)   Wt 57 kg   SpO2 99%   BMI 20.91 kg/m  Gen:   Awake, no distress   Resp:  Normal effort, clear to auscultation bilaterally MSK:   Moves extremities without difficulty; no swelling or tenderness of bilateral lower extremities Other:  +2 radial pulse bilaterally.  Medical Decision Making  Medically screening exam initiated at 12:56 PM.  Appropriate orders placed.  Hara Milholland was informed that the remainder of the evaluation will be completed by another provider, this initial triage assessment does not replace that evaluation, and the importance of remaining in the ED until their evaluation is complete.     Loni [place], [place] 08/01/22 1258

## 2022-08-01 NOTE — ED Provider Notes (Signed)
[facility] DEPART[facility] Provider Note   CSN: [id number] Arrival date & time: 08/01/22  1236     History {Add pertinent medical, surgical, social history, OB history to HPI:1} Chief Complaint  Patient presents with   Loss of Consciousness    Angela [name] is a 36 y.o. female.   Loss of Consciousness      Home Medications Prior to Admission medications   Medication Sig Start Date End Date Taking? Authorizing Provider  albuterol (PROVENTIL HFA) 108 (90 Base) MCG/ACT inhaler Inhale 2 puffs into the lungs every 6 (six) hours as needed for wheezing or shortness of breath. 08/19/21   [name], MD  amLODipine (NORVASC) 10 MG tablet TAKE ONE TABLET BY MOUTH EVERY MORNING 12/16/21   [name], MD  benzonatate (TESSALON) 100 MG capsule Take 1 capsule (100 mg total) by mouth 2 (two) times daily as needed for cough. Patient not taking: Reported on 12/01/2021 08/19/21   [name], MD  busPIRone (BUSPAR) 15 MG tablet TAKE ONE TABLET BY MOUTH EVERYDAY AT BEDTIME 11/15/21   [name], MD  Continuous Blood Gluc Sensor (DEXCOM G6 SENSOR) MISC APPLY 1 SENSOR EVERY 10 DAYS 07/15/21   [provider]  Continuous Blood Gluc Sensor (DEXCOM G6 SENSOR) MISC SMARTSIG:1 Topical Every 10 Days 10/11/21   [provider]  Continuous Blood Gluc Transmit (DEXCOM G6 TRANSMITTER) MISC See admin instructions. 03/04/21   [provider]  diltiazem (CARDIZEM CD) 240 MG 24 hr capsule Take 240 mg by mouth daily. 08/27/21   [provider]  escitalopram (LEXAPRO) 20 MG tablet TAKE ONE TABLET BY MOUTH ONCE DAILY 03/24/22   [name], MD  furosemide (LASIX) 40 MG tablet Take 2 tablets (80 mg total) by mouth daily. 12/01/21   Janina Mayo, MD  gabapentin (NEURONTIN) 300 MG capsule TAKE TWO CAPSULES BY MOUTH EVERY MORNING and TAKE TWO CAPSULES BY MOUTH EVERYDAY AT BEDTIME 02/10/22   [name], MD  Galcanezumab-gnlm  [facility]) 120 MG/ML SOAJ Inject 120 mg into the skin every 30 (thirty) days. Patient not taking: Reported on 12/01/2021 01/05/21   [name], MD  hydrALAZINE (APRESOLINE) 50 MG tablet TAKE TWO TABLETS BY MOUTH EVERY MORNING and TAKE TWO TABLETS BY MOUTH AT NOON and TAKE TWO TABLETS BY MOUTH EVERYDAY AT BEDTIME 12/30/21   [name], MD  Insulin Pen Needle 32G X 4 MM MISC Use to inject insulin once a day. 06/29/21   [name], MD  Insulin Syringe-Needle U-100 (BD INSULIN SYRINGE ULTRAFINE) 31G X 15/64" 1 ML MISC Used to give daily insulin injections. 04/24/18   [name], MD  ketoconazole (NIZORAL) 2 % shampoo Apply 1 application topically 2 (two) times a week. Wash off after each use Patient not taking: Reported on 01/06/2022 08/19/21   [name], MD  levETIRAcetam (KEPPRA) 250 MG tablet Take 1 tablet (250 mg total) by mouth every Monday, Wednesday, and Friday at 6 PM. Please take along with Keppra 500 mg p.o. daily on the days of dialysis, after dialysis. 10/22/21   [name], MD  levETIRAcetam (KEPPRA) 500 MG tablet TAKE 1 TABLET(500 MG) BY MOUTH DAILY AS DIRECTED 03/16/22   [name], MD  levothyroxine (SYNTHROID) 175 MCG tablet Take 175 mcg by mouth every morning. 06/09/21   [provider]  losartan (COZAAR) 50 MG tablet Take 1.5 tablets (75 mg total) by mouth daily. 12/01/21   Janina Mayo, MD  multivitamin (RENA-VIT) TABS tablet Take 1 tablet by mouth at bedtime. 10/01/21   [provider]  NOVOLOG 100 UNIT/ML injection USE AS DIRECTED in insulin pump 11/01/21   [name], MD  ondansetron (ZOFRAN ODT) 4 MG disintegrating tablet Take 1 tablet (4 mg total) by mouth daily as needed for nausea or vomiting. 12/10/21   [name], MD  pantoprazole (PROTONIX) 40 MG tablet Take 1 tablet (40 mg total) by mouth daily. 12/10/21   [name], MD  pravastatin (PRAVACHOL) 20 MG tablet TAKE ONE TABLET BY MOUTH EVERYDAY  AT BEDTIME 02/10/22   [name], MD  Rimegepant Sulfate (NURTEC) 75 MG TBDP Take 75 mg by mouth daily as needed. For migraines. Take as close to onset of migraine as possible. One daily maximum. Patient not taking: Reported on 12/01/2021 01/05/21   [name], MD  TRESIBA FLEXTOUCH 100 UNIT/ML FlexTouch Pen Inject 12 Units into the skin daily as needed (high blood sugar). 05/31/21   [provider]  metoCLOPramide (REGLAN) 5 MG tablet Take 1 tablet (5 mg total) by mouth 3 (three) times daily as needed for nausea or vomiting. Patient not taking: Reported on 06/25/2020 02/28/19 06/25/20  [name], MD      Allergies    Ferumoxytol, Lisinopril, Sulfamethoxazole, and Trimethoprim    Review of Systems   Review of Systems  Cardiovascular:  Positive for syncope.    Physical Exam Updated Vital Signs BP (!) 166/109 (BP Location: Right Arm)   Pulse (!) 101   Temp 98.4 F (36.9 C) (Oral)   Resp 19   Ht 5\' 5"  (1.651 m)   Wt 57 kg   SpO2 99%   BMI 20.91 kg/m  Physical Exam  ED Results / Procedures / Treatments   Labs (all labs ordered are listed, but only abnormal results are displayed) Labs Reviewed  COMPREHENSIVE METABOLIC PANEL - Abnormal; Notable for the following components:      Result Value   Chloride 97 (*)    Glucose, Bld 178 (*)    Creatinine, Ser 6.06 (*)    Alkaline Phosphatase 145 (*)    GFR, Estimated 9 (*)    All other components within normal limits  CBC WITH DIFFERENTIAL/PLATELET - Abnormal; Notable for the following components:   RBC 2.78 (*)    Hemoglobin 8.4 (*)    HCT 25.6 (*)    Platelets 119 (*)    Eosinophils Absolute 0.7 (*)    All other components within normal limits  CBG MONITORING, ED - Abnormal; Notable for the following components:   Glucose-Capillary 227 (*)    All other components within normal limits  LEVETIRACETAM LEVEL  I-STAT BETA HCG BLOOD, ED (MC, WL, AP ONLY)  TROPONIN I (HIGH SENSITIVITY)  TROPONIN I (HIGH  SENSITIVITY)    EKG None  Radiology DG Chest 2 View  Result Date: 08/01/2022 CLINICAL DATA:  Chest pain EXAM: CHEST - 2 VIEW COMPARISON:  04/05/2022 FINDINGS: Transverse diameter of heart is increased. There are no signs of pulmonary edema or focal pulmonary consolidation. There is no pleural effusion or pneumothorax. Old fractures are seen in the right fifth and sixth ribs with no change. IMPRESSION: Cardiomegaly. There are no signs of pulmonary edema or focal pulmonary consolidation. Electronically Signed   By: Elmer Picker M.D.   On: 08/01/2022 13:15    Procedures Procedures  {Document cardiac monitor, telemetry assessment procedure when appropriate:1}  Medications Ordered in ED Medications - No data to  display  ED Course/ Medical Decision Making/ A&P                           Medical Decision Making  ***  {Document critical care time when appropriate:1} {Document review of labs and clinical decision tools ie heart score, Chads2Vasc2 etc:1}  {Document your independent review of radiology images, and any outside records:1} {Document your discussion with family members, caretakers, and with consultants:1} {Document social determinants of health affecting pt's care:1} {Document your decision making why or why not admission, treatments were needed:1} Final Clinical Impression(s) / ED Diagnoses Final diagnoses:  None    Rx / DC Orders ED Discharge Orders     None

## 2022-08-01 NOTE — ED Notes (Signed)
Called pts name 3x to be roomed with no response.  

## 2022-08-01 NOTE — ED Triage Notes (Signed)
Pt bib ems for chest tightness and sob since last week, worsening symptoms today leading up to a syncopal episode. MWF dialysis pt.

## 2022-08-02 DIAGNOSIS — Z5181 Encounter for therapeutic drug level monitoring: Secondary | ICD-10-CM | POA: Diagnosis not present

## 2022-08-02 DIAGNOSIS — K3184 Gastroparesis: Secondary | ICD-10-CM | POA: Diagnosis not present

## 2022-08-02 DIAGNOSIS — Z79890 Hormone replacement therapy (postmenopausal): Secondary | ICD-10-CM | POA: Diagnosis not present

## 2022-08-02 DIAGNOSIS — E1040 Type 1 diabetes mellitus with diabetic neuropathy, unspecified: Secondary | ICD-10-CM | POA: Diagnosis not present

## 2022-08-02 DIAGNOSIS — N186 End stage renal disease: Secondary | ICD-10-CM | POA: Diagnosis not present

## 2022-08-02 DIAGNOSIS — Z992 Dependence on renal dialysis: Secondary | ICD-10-CM | POA: Diagnosis not present

## 2022-08-02 DIAGNOSIS — Z91148 Patient's other noncompliance with medication regimen for other reason: Secondary | ICD-10-CM | POA: Diagnosis not present

## 2022-08-02 DIAGNOSIS — E1022 Type 1 diabetes mellitus with diabetic chronic kidney disease: Secondary | ICD-10-CM | POA: Diagnosis not present

## 2022-08-02 DIAGNOSIS — E1065 Type 1 diabetes mellitus with hyperglycemia: Secondary | ICD-10-CM | POA: Diagnosis not present

## 2022-08-02 DIAGNOSIS — E039 Hypothyroidism, unspecified: Secondary | ICD-10-CM | POA: Diagnosis not present

## 2022-08-02 DIAGNOSIS — E1043 Type 1 diabetes mellitus with diabetic autonomic (poly)neuropathy: Secondary | ICD-10-CM | POA: Diagnosis not present

## 2022-08-02 LAB — LEVETIRACETAM LEVEL: Levetiracetam Lvl: 6.2 ug/mL — ABNORMAL LOW (ref 10.0–40.0)

## 2022-08-02 LAB — POC OCCULT BLOOD, ED: Fecal Occult Bld: NEGATIVE

## 2022-08-02 NOTE — Discharge Instructions (Addendum)
Your hemoglobin was low today but stable compared to previous measurements at outpatient facilities.  You have no concern for GI bleed with normal bowel movements and no dark stools.  Your CT scan was negative for a blood clot or other acute abnormality.  Your laboratory work-up beyond low hemoglobin was reassuring.  As you are 2 kg up from your dry weight, you may benefit from further fluid removal at dialysis on Wednesday but there is no indication for acute dialysis at this time.  Please return to the emergency department for any worsening of your symptoms.

## 2022-08-03 ENCOUNTER — Telehealth: Payer: Self-pay

## 2022-08-03 DIAGNOSIS — D631 Anemia in chronic kidney disease: Secondary | ICD-10-CM | POA: Diagnosis not present

## 2022-08-03 DIAGNOSIS — E8779 Other fluid overload: Secondary | ICD-10-CM | POA: Diagnosis not present

## 2022-08-03 DIAGNOSIS — N186 End stage renal disease: Secondary | ICD-10-CM | POA: Diagnosis not present

## 2022-08-03 DIAGNOSIS — N2581 Secondary hyperparathyroidism of renal origin: Secondary | ICD-10-CM | POA: Diagnosis not present

## 2022-08-03 NOTE — Telephone Encounter (Signed)
Transition Care Management Unsuccessful Follow-up Telephone Call  Date of discharge and from where:  08/02/2022 from [facility]  Attempts:  1st Attempt  Reason for unsuccessful TCM follow-up call:  Unable to reach patient  Connection was good during call and the call ended up dropping.  Called again but line was busy .

## 2022-08-04 DIAGNOSIS — E8779 Other fluid overload: Secondary | ICD-10-CM | POA: Diagnosis not present

## 2022-08-04 DIAGNOSIS — N186 End stage renal disease: Secondary | ICD-10-CM | POA: Diagnosis not present

## 2022-08-04 DIAGNOSIS — N2581 Secondary hyperparathyroidism of renal origin: Secondary | ICD-10-CM | POA: Diagnosis not present

## 2022-08-04 DIAGNOSIS — D631 Anemia in chronic kidney disease: Secondary | ICD-10-CM | POA: Diagnosis not present

## 2022-08-04 NOTE — Telephone Encounter (Signed)
Transition Care Management Unsuccessful Follow-up Telephone Call  Date of discharge and from where:  08/02/2022 from [facility]  Attempts:  2nd Attempt  Reason for unsuccessful TCM follow-up call:  Unable to reach patient

## 2022-08-05 DIAGNOSIS — N186 End stage renal disease: Secondary | ICD-10-CM | POA: Diagnosis not present

## 2022-08-05 DIAGNOSIS — E8779 Other fluid overload: Secondary | ICD-10-CM | POA: Diagnosis not present

## 2022-08-05 DIAGNOSIS — D631 Anemia in chronic kidney disease: Secondary | ICD-10-CM | POA: Diagnosis not present

## 2022-08-05 DIAGNOSIS — N2581 Secondary hyperparathyroidism of renal origin: Secondary | ICD-10-CM | POA: Diagnosis not present

## 2022-08-08 NOTE — Telephone Encounter (Signed)
Transition Care Management Unsuccessful Follow-up Telephone Call  Date of discharge and from where:  08/02/2022 from [facility]  Attempts:  3rd Attempt  Reason for unsuccessful TCM follow-up call:  Unable to reach patient    

## 2022-08-09 DIAGNOSIS — E042 Nontoxic multinodular goiter: Secondary | ICD-10-CM | POA: Diagnosis not present

## 2022-08-10 DIAGNOSIS — N2581 Secondary hyperparathyroidism of renal origin: Secondary | ICD-10-CM | POA: Diagnosis not present

## 2022-08-10 DIAGNOSIS — E8779 Other fluid overload: Secondary | ICD-10-CM | POA: Diagnosis not present

## 2022-08-10 DIAGNOSIS — N186 End stage renal disease: Secondary | ICD-10-CM | POA: Diagnosis not present

## 2022-08-10 DIAGNOSIS — D631 Anemia in chronic kidney disease: Secondary | ICD-10-CM | POA: Diagnosis not present

## 2022-08-12 DIAGNOSIS — D631 Anemia in chronic kidney disease: Secondary | ICD-10-CM | POA: Diagnosis not present

## 2022-08-12 DIAGNOSIS — N186 End stage renal disease: Secondary | ICD-10-CM | POA: Diagnosis not present

## 2022-08-12 DIAGNOSIS — N2581 Secondary hyperparathyroidism of renal origin: Secondary | ICD-10-CM | POA: Diagnosis not present

## 2022-08-12 DIAGNOSIS — E8779 Other fluid overload: Secondary | ICD-10-CM | POA: Diagnosis not present

## 2022-08-17 ENCOUNTER — Other Ambulatory Visit: Payer: Self-pay | Admitting: Internal Medicine

## 2022-08-17 DIAGNOSIS — N186 End stage renal disease: Secondary | ICD-10-CM | POA: Diagnosis not present

## 2022-08-17 DIAGNOSIS — N2581 Secondary hyperparathyroidism of renal origin: Secondary | ICD-10-CM | POA: Diagnosis not present

## 2022-08-17 DIAGNOSIS — E8779 Other fluid overload: Secondary | ICD-10-CM | POA: Diagnosis not present

## 2022-08-17 DIAGNOSIS — D631 Anemia in chronic kidney disease: Secondary | ICD-10-CM | POA: Diagnosis not present

## 2022-08-18 DIAGNOSIS — Z992 Dependence on renal dialysis: Secondary | ICD-10-CM | POA: Diagnosis not present

## 2022-08-18 DIAGNOSIS — N186 End stage renal disease: Secondary | ICD-10-CM | POA: Diagnosis not present

## 2022-08-23 ENCOUNTER — Other Ambulatory Visit: Payer: Self-pay | Admitting: Internal Medicine

## 2022-08-23 DIAGNOSIS — F418 Other specified anxiety disorders: Secondary | ICD-10-CM

## 2022-08-23 DIAGNOSIS — I10 Essential (primary) hypertension: Secondary | ICD-10-CM

## 2022-08-23 DIAGNOSIS — F331 Major depressive disorder, recurrent, moderate: Secondary | ICD-10-CM

## 2022-08-23 NOTE — Telephone Encounter (Signed)
Pt is requesting refills. Pt had a virtual visit 10/2021. Will forward to provider

## 2022-08-24 ENCOUNTER — Telehealth: Payer: Self-pay | Admitting: Internal Medicine

## 2022-08-24 NOTE — Telephone Encounter (Signed)
Pt informed of pcp notes. Pt expressed understanding , and scheduled for appt. Tue 10/03@2 :10 w/ pcp. ------DD,RMA

## 2022-08-25 NOTE — Telephone Encounter (Signed)
Sent pt a MyChart message

## 2022-08-31 ENCOUNTER — Other Ambulatory Visit: Payer: Self-pay | Admitting: Internal Medicine

## 2022-08-31 DIAGNOSIS — G40909 Epilepsy, unspecified, not intractable, without status epilepticus: Secondary | ICD-10-CM

## 2022-09-20 ENCOUNTER — Ambulatory Visit: Payer: Medicaid Other | Admitting: Internal Medicine

## 2022-09-22 ENCOUNTER — Other Ambulatory Visit: Payer: Self-pay | Admitting: Internal Medicine

## 2022-09-22 NOTE — Telephone Encounter (Signed)
Requested Prescriptions  Pending Prescriptions Disp Refills  . COMFORT EZ PEN NEEDLES 32G X 4 MM MISC [Pharmacy Med Name: Comfort EZ Pen Needles 32 gauge x 5/32"] 100 each 11    Sig: USE TO INJECT insulin ONCE DAILY AS DIRECTED     Endocrinology: Diabetes - Testing Supplies Passed - 09/22/2022  9:40 AM      Passed - Valid encounter within last 12 months    Recent Outpatient Visits          11 months ago Hospital discharge follow-up   [place] Karle Plumber B, MD   1 year ago Bronchitis with asthma, acute   [place] [name], MD   1 year ago [place], [place], [place]   1 year ago Other insomnia   [facility] [name], MD   1 year ago 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 pravastatin (PRAVACHOL) 20 MG tablet [Pharmacy Med Name: pravastatin 20 mg tablet] 90 tablet 0    Sig: TAKE ONE TABLET BY MOUTH EVERYDAY AT BEDTIME     Cardiovascular:  Antilipid - Statins Failed - 09/22/2022  9:40 AM      Failed - Lipid Panel in normal range within the last 12 months    Cholesterol, Total  Date Value Ref Range Status  12/28/2020 239 (H) 100 - 199 mg/dL Final   LDL Chol Calc (NIH)  Date Value Ref Range Status  12/28/2020 151 (H) 0 - 99 mg/dL Final   HDL  Date Value Ref Range Status  12/28/2020 72 >39 mg/dL Final   Triglycerides  Date Value Ref Range Status  12/28/2020 93 0 - 149 mg/dL Final         Passed - Patient is not pregnant      Passed - Valid encounter within last 12 months    Recent Outpatient Visits          11 months ago Hospital discharge follow-up   [place] [name], MD   1 year ago Bronchitis with asthma, acute   [place] [name], MD   1 year ago [place], [place], [place]   1 year ago Other insomnia   [facility] Karle Plumber B, MD   1 year ago 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

## 2022-09-22 NOTE — Telephone Encounter (Signed)
Requested medication (s) are due for refill today expired Rx  Requested medication (s) are on the active medication list -yes  Future visit scheduled -no  Last refill: 06/29/22 #100 [id number]  Notes to clinic: expired Rx  Requested Prescriptions  Pending Prescriptions Disp Refills   COMFORT EZ PEN NEEDLES 32G X 4 MM [place] [Pharmacy Med Name: Comfort EZ Pen Needles 32 gauge x 5/32"] 100 each 11    Sig: USE TO INJECT insulin ONCE DAILY AS DIRECTED     Endocrinology: Diabetes - Testing Supplies Passed - 09/22/2022  9:40 AM      Passed - Valid encounter within last 12 months    Recent Outpatient Visits           11 months ago Hospital discharge follow-up   [place] [name], MD   1 year ago Bronchitis with asthma, acute   [place], Deborah B, MD   1 year ago [place], [place], [place]   1 year ago Other insomnia   [place], Deborah B, MD   1 year ago 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, with long-term current use of insulin ([place])   [place] Swords, [name], MD              Signed Prescriptions Disp Refills   pravastatin (PRAVACHOL) 20 MG tablet 90 tablet 0    Sig: TAKE ONE TABLET BY MOUTH EVERYDAY AT BEDTIME     Cardiovascular:  Antilipid - Statins Failed - 09/22/2022  9:40 AM      Failed - Lipid Panel in normal range within the last 12 months    Cholesterol, Total  Date Value Ref Range Status  12/28/2020 239 (H) 100 - 199 mg/dL Final   LDL Chol Calc (NIH)  Date Value Ref Range Status  12/28/2020 151 (H) 0 - 99 mg/dL Final   HDL  Date Value Ref Range Status  12/28/2020 72 >39 mg/dL Final   Triglycerides  Date Value Ref Range Status  12/28/2020 93 0 - 149 mg/dL Final         Passed - Patient is not pregnant      Passed - Valid encounter within  last 12 months    Recent Outpatient Visits           11 months ago Hospital discharge follow-up   [place], Deborah B, MD   1 year ago Bronchitis with asthma, acute   [place], Deborah B, MD   1 year ago [place], [place], [place]   1 year ago Other insomnia   [place], Deborah B, MD   1 year ago 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, with long-term current use of insulin ([place])   [place] Swords, [name], MD                 Requested Prescriptions  Pending Prescriptions Disp Refills   COMFORT EZ PEN NEEDLES 32G X 4 MM [place] [Pharmacy Med Name: Comfort EZ Pen Needles 32 gauge x 5/32"] 100 each 11    Sig: USE TO INJECT insulin ONCE DAILY AS DIRECTED     Endocrinology: Diabetes - Testing Supplies Passed -  09/22/2022  9:40 AM      Passed - Valid encounter within last 12 months    Recent Outpatient Visits           11 months ago Hospital discharge follow-up   [place] [name], MD   1 year ago Bronchitis with asthma, acute   [place] [name], MD   1 year ago [place], [place], [place]   1 year ago Other insomnia   [place], Deborah B, MD   1 year ago 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, with long-term current use of insulin ([place])   [place] Swords, [name], MD              Signed Prescriptions Disp Refills   pravastatin (PRAVACHOL) 20 MG tablet 90 tablet 0    Sig: TAKE ONE TABLET BY MOUTH EVERYDAY AT BEDTIME     Cardiovascular:  Antilipid - Statins Failed - 09/22/2022  9:40 AM      Failed - Lipid Panel in normal range within  the last 12 months    Cholesterol, Total  Date Value Ref Range Status  12/28/2020 239 (H) 100 - 199 mg/dL Final   LDL Chol Calc (NIH)  Date Value Ref Range Status  12/28/2020 151 (H) 0 - 99 mg/dL Final   HDL  Date Value Ref Range Status  12/28/2020 72 >39 mg/dL Final   Triglycerides  Date Value Ref Range Status  12/28/2020 93 0 - 149 mg/dL Final         Passed - Patient is not pregnant      Passed - Valid encounter within last 12 months    Recent Outpatient Visits           11 months ago Hospital discharge follow-up   [place] [name], MD   1 year ago Bronchitis with asthma, acute   [place] [name], MD   1 year ago [place], [place], [place]   1 year ago Other insomnia   [facility] Karle Plumber B, MD   1 year ago 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

## 2022-09-23 ENCOUNTER — Other Ambulatory Visit: Payer: Self-pay | Admitting: Internal Medicine

## 2022-09-23 NOTE — Telephone Encounter (Signed)
Requested Prescriptions  Pending Prescriptions Disp Refills  . COMFORT EZ PEN NEEDLES 32G X 4 MM MISC [Pharmacy Med Name: Comfort EZ Pen Needles 32 gauge x 5/32"] 100 each 0    Sig: USE TO INJECT insulin ONCE DAILY AS DIRECTED     Endocrinology: Diabetes - Testing Supplies Passed - 09/23/2022 12:38 PM      Passed - Valid encounter within last 12 months    Recent Outpatient Visits          11 months ago Hospital discharge follow-up   [place] [name], MD   1 year ago Bronchitis with asthma, acute   [place] [name], MD   1 year ago [place], [place], [place]   1 year ago Other insomnia   [place] [facility] Karle Plumber B, MD   1 year ago 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

## 2022-10-20 ENCOUNTER — Other Ambulatory Visit: Payer: Self-pay | Admitting: Internal Medicine

## 2022-10-24 ENCOUNTER — Ambulatory Visit: Payer: Medicaid Other | Admitting: Obstetrics and Gynecology

## 2022-11-04 ENCOUNTER — Ambulatory Visit: Payer: Medicaid Other | Attending: Internal Medicine | Admitting: Internal Medicine

## 2022-11-04 DIAGNOSIS — E1042 Type 1 diabetes mellitus with diabetic polyneuropathy: Secondary | ICD-10-CM | POA: Diagnosis not present

## 2022-11-04 DIAGNOSIS — R590 Localized enlarged lymph nodes: Secondary | ICD-10-CM | POA: Diagnosis not present

## 2022-11-04 DIAGNOSIS — E042 Nontoxic multinodular goiter: Secondary | ICD-10-CM | POA: Diagnosis not present

## 2022-11-04 NOTE — Progress Notes (Signed)
Patient ID: Angela [name], female   DOB: [birth year], 36 y.o.   MRN: [id number] Virtual Visit via Telephone Note  I connected with Angela [name] on 11/04/2022 at 12:06 PM by telephone and verified that I am speaking with the correct person using two identifiers  Location: Patient: in HD unit.  Pt gave okay to proceed Provider: office  Participants: Myself Patient   I discussed the limitations, risks, security and privacy concerns of performing an evaluation and management service by telephone and the availability of in person appointments. I also discussed with the patient that there may be a patient responsible charge related to this service. The patient expressed understanding and agreed to proceed.   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  It showed 3 thyroid nodules 1 in the right thyroid gland and the other 1 in the isthmus for which the radiologist had recommended biopsy.  In her letter she stated that they will take care of the thyroid nodules but she wanted me to be aware of left supraclavicular lymphadenopathy that was also seen measuring 2 x 2.7 x 1.2 cm.  Radiologist recommended repeat ultrasound in 6 to 8 weeks to assess for growth stating that it may be reactive or secondary to lymphoproliferative disorder. Patient tells me today that she last saw her endocrinology in August.  So far no thyroid biopsy has been scheduled.  She feels a mass on the right side of the anterior lower neck.  Denies any enlargement of mass/lymph node in the axilla or groin areas.  No fever or night sweats.  She does not smoke.  She is requesting a handicap sticker due to  her diabetic neuropathy and easy fatigue associated with end-stage renal disease and history of anemia of chronic disease.  I told her that I will sign off on a form for this.    Outpatient Encounter Medications as of 11/04/2022  Medication Sig Note   albuterol (PROVENTIL HFA) 108 (90 Base) MCG/ACT inhaler Inhale 2 puffs into the lungs every 6 (six) hours as needed for wheezing or shortness of breath.    amLODipine (NORVASC) 10 MG tablet Take 1 tablet (10 mg total) by mouth every morning. Needs to be seen prior to next refill request    benzonatate (TESSALON) 100 MG capsule Take 1 capsule (100 mg total) by mouth 2 (two) times daily as needed for cough. (Patient not taking: Reported on 12/01/2021)    busPIRone (BUSPAR) 15 MG tablet Take 1 tablet (15 mg total) by mouth at bedtime. Needs to be seen prior to next refill request    COMFORT EZ PEN NEEDLES 32G X 4 MM MISC USE TO INJECT insulin ONCE DAILY AS DIRECTED    Continuous Blood Gluc Sensor (DEXCOM G6 SENSOR) MISC APPLY 1 SENSOR EVERY 10 DAYS    Continuous Blood Gluc Sensor (DEXCOM G6 SENSOR) MISC SMARTSIG:1 Topical Every 10 Days    Continuous Blood Gluc Transmit (DEXCOM G6 TRANSMITTER) MISC See admin instructions.    diltiazem (CARDIZEM CD) 240 MG 24 hr capsule Take 240 mg by mouth daily.    escitalopram (LEXAPRO) 20 MG tablet TAKE ONE TABLET BY MOUTH ONCE DAILY  furosemide (LASIX) 40 MG tablet Take 2 tablets (80 mg total) by mouth daily.    gabapentin (NEURONTIN) 300 MG capsule TAKE TWO CAPSULES BY MOUTH EVERY MORNING and TAKE TWO CAPSULES BY MOUTH EVERYDAY AT BEDTIME    Galcanezumab-gnlm (EMGALITY) 120 MG/ML SOAJ Inject 120 mg into the skin every 30 (thirty) days. (Patient not taking: Reported on 12/01/2021)    hydrALAZINE (APRESOLINE) 50 MG tablet TAKE TWO TABLETS BY MOUTH EVERY MORNING and TAKE TWO TABLETS BY MOUTH AT NOON and TAKE TWO TABLETS BY MOUTH EVERYDAY AT BEDTIME    Insulin Syringe-Needle U-100 (BD INSULIN SYRINGE ULTRAFINE) 31G X  15/64" 1 ML MISC Used to give daily insulin injections.    ketoconazole (NIZORAL) 2 % shampoo Apply 1 application topically 2 (two) times a week. Wash off after each use (Patient not taking: Reported on 01/06/2022)    levETIRAcetam (KEPPRA) 250 MG tablet Take 1 tablet (250 mg total) by mouth every Monday, Wednesday, and Friday at 6 PM. Please take along with Keppra 500 mg p.o. daily on the days of dialysis, after dialysis.    levETIRAcetam (KEPPRA) 500 MG tablet TAKE 1 TABLET(500 MG) BY MOUTH DAILY AS DIRECTED    levothyroxine (SYNTHROID) 175 MCG tablet Take 175 mcg by mouth every morning.    losartan (COZAAR) 50 MG tablet Take 1.5 tablets (75 mg total) by mouth daily.    multivitamin (RENA-VIT) TABS tablet Take 1 tablet by mouth at bedtime.    NOVOLOG 100 UNIT/ML injection USE AS DIRECTED in insulin pump 01/06/2022: Sliding scale until insulin pump restarted   ondansetron (ZOFRAN ODT) 4 MG disintegrating tablet Take 1 tablet (4 mg total) by mouth daily as needed for nausea or vomiting.    pantoprazole (PROTONIX) 40 MG tablet Take 1 tablet (40 mg total) by mouth daily.    pravastatin (PRAVACHOL) 20 MG tablet TAKE ONE TABLET BY MOUTH EVERYDAY AT BEDTIME    Rimegepant Sulfate (NURTEC) 75 MG TBDP Take 75 mg by mouth daily as needed. For migraines. Take as close to onset of migraine as possible. One daily maximum. (Patient not taking: Reported on 12/01/2021)    TRESIBA FLEXTOUCH 100 UNIT/ML FlexTouch Pen Inject 12 Units into the skin daily as needed (high blood sugar). 01/06/2022: 10 units every night until insulin pump restarted   [DISCONTINUED] metoCLOPramide (REGLAN) 5 MG tablet Take 1 tablet (5 mg total) by mouth 3 (three) times daily as needed for nausea or vomiting. (Patient not taking: Reported on 06/25/2020)    Facility-Administered Encounter Medications as of 11/04/2022  Medication   adenosine (diagnostic) (ADENOSCAN) infusion 32.4 mg      Observations/Objective: No direct observation done as  this was a telephone visit.  Assessment and Plan: 1. Multiple thyroid nodules Followed by [facility] endocrinology.  Phone call placed today to try to touch base with the nurse practitioner who sent me the letter to inquire whether they plan to do a biopsy on the 2 thyroid nodules as was recommended by the radiologist.  I think that would be important to make sure that those are not cancerous lesions because if they are, could be causing the supraclavicular lymphadenopathy seen.  I left a message for her to call me back.  2. Supraclavicular lymphadenopathy We will schedule a CT soft tissue of the neck without contrast - CT SOFT TISSUE NECK WO CONTRAST; Future  3. 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   Follow Up Instructions:  I discussed the assessment and treatment plan with the patient. The patient was provided an opportunity to ask questions and all were answered. The patient agreed with the plan and demonstrated an understanding of the instructions.   The patient was advised to call back or seek an in-person evaluation if the symptoms worsen or if the condition fails to improve as anticipated.  I  Spent 10 minutes on this telephone encounter  This note has been created with Surveyor, quantity. Any transcriptional errors are unintentional.  Karle Plumber, MD

## 2022-11-17 ENCOUNTER — Other Ambulatory Visit: Payer: Self-pay | Admitting: Internal Medicine

## 2022-11-17 ENCOUNTER — Emergency Department (HOSPITAL_COMMUNITY): Payer: Medicaid Other

## 2022-11-17 ENCOUNTER — Encounter (HOSPITAL_COMMUNITY): Payer: Self-pay

## 2022-11-17 ENCOUNTER — Emergency Department (HOSPITAL_COMMUNITY)
Admission: EM | Admit: 2022-11-17 | Discharge: 2022-11-18 | Disposition: A | Discharge status: Home / Self Care | Payer: Medicaid Other | Attending: Emergency Medicine | Admitting: Emergency Medicine

## 2022-11-17 DIAGNOSIS — R112 Nausea with vomiting, unspecified: Secondary | ICD-10-CM | POA: Insufficient documentation

## 2022-11-17 DIAGNOSIS — E11649 Type 2 diabetes mellitus with hypoglycemia without coma: Secondary | ICD-10-CM | POA: Diagnosis not present

## 2022-11-17 DIAGNOSIS — F418 Other specified anxiety disorders: Secondary | ICD-10-CM

## 2022-11-17 DIAGNOSIS — E162 Hypoglycemia, unspecified: Secondary | ICD-10-CM

## 2022-11-17 DIAGNOSIS — Z794 Long term (current) use of insulin: Secondary | ICD-10-CM | POA: Insufficient documentation

## 2022-11-17 DIAGNOSIS — R519 Headache, unspecified: Secondary | ICD-10-CM | POA: Diagnosis not present

## 2022-11-17 DIAGNOSIS — F331 Major depressive disorder, recurrent, moderate: Secondary | ICD-10-CM

## 2022-11-17 DIAGNOSIS — E1042 Type 1 diabetes mellitus with diabetic polyneuropathy: Secondary | ICD-10-CM

## 2022-11-17 LAB — BASIC METABOLIC PANEL
Anion gap: 11 (ref 5–15)
BUN: 8 mg/dL (ref 6–20)
CO2: 27 mmol/L (ref 22–32)
Calcium: 9.7 mg/dL (ref 8.9–10.3)
Chloride: 99 mmol/L (ref 98–111)
Creatinine, Ser: 2.58 mg/dL — ABNORMAL HIGH (ref 0.44–1.00)
GFR, Estimated: 24 mL/min — ABNORMAL LOW (ref >60)
Glucose, Bld: 136 mg/dL — ABNORMAL HIGH (ref 70–99)
Potassium: 3.5 mmol/L (ref 3.5–5.1)
Sodium: 137 mmol/L (ref 135–145)

## 2022-11-17 LAB — HEPATIC FUNCTION PANEL
ALT: 52 U/L — ABNORMAL HIGH (ref 0–44)
AST: 64 U/L — ABNORMAL HIGH (ref 15–41)
Albumin: 3.7 g/dL (ref 3.5–5.0)
Alkaline Phosphatase: 178 U/L — ABNORMAL HIGH (ref 38–126)
Bilirubin, Direct: 0.1 mg/dL (ref 0.0–0.2)
Indirect Bilirubin: 0.3 mg/dL (ref 0.3–0.9)
Total Bilirubin: 0.4 mg/dL (ref 0.3–1.2)
Total Protein: 7 g/dL (ref 6.5–8.1)

## 2022-11-17 LAB — CBC WITH DIFFERENTIAL/PLATELET
Abs Immature Granulocytes: 0.02 10*3/uL (ref 0.00–0.07)
Basophils Absolute: 0 10*3/uL (ref 0.0–0.1)
Basophils Relative: 0 %
Eosinophils Absolute: 0.1 10*3/uL (ref 0.0–0.5)
Eosinophils Relative: 2 %
HCT: 21.9 % — ABNORMAL LOW (ref 36.0–46.0)
Hemoglobin: 7 g/dL — ABNORMAL LOW (ref 12.0–15.0)
Immature Granulocytes: 0 %
Lymphocytes Relative: 5 %
Lymphs Abs: 0.4 10*3/uL — ABNORMAL LOW (ref 0.7–4.0)
MCH: 29.4 pg (ref 26.0–34.0)
MCHC: 32 g/dL (ref 30.0–36.0)
MCV: 92 fL (ref 80.0–100.0)
Monocytes Absolute: 0.3 10*3/uL (ref 0.1–1.0)
Monocytes Relative: 4 %
Neutro Abs: 6.9 10*3/uL (ref 1.7–7.7)
Neutrophils Relative %: 89 %
Platelets: 128 10*3/uL — ABNORMAL LOW (ref 150–400)
RBC: 2.38 MIL/uL — ABNORMAL LOW (ref 3.87–5.11)
RDW: 12.2 % (ref 11.5–15.5)
WBC: 7.7 10*3/uL (ref 4.0–10.5)
nRBC: 0 % (ref 0.0–0.2)

## 2022-11-17 LAB — I-STAT BETA HCG BLOOD, ED (MC, WL, AP ONLY): I-stat hCG, quantitative: <5 m[IU]/mL (ref <5)

## 2022-11-17 LAB — CBG MONITORING, ED
Glucose-Capillary: 140 mg/dL — ABNORMAL HIGH (ref 70–99)
Glucose-Capillary: 44 mg/dL — CL (ref 70–99)
Glucose-Capillary: 111 mg/dL — ABNORMAL HIGH (ref 70–99)
Glucose-Capillary: 84 mg/dL (ref 70–99)
Glucose-Capillary: 99 mg/dL (ref 70–99)

## 2022-11-17 LAB — TROPONIN I (HIGH SENSITIVITY): Troponin I (High Sensitivity): 11 ng/L (ref <18)

## 2022-11-17 MED ORDER — SODIUM CHLORIDE 0.9% FLUSH: 3.0000 mL | INTRAVENOUS | Status: DC | PRN

## 2022-11-17 MED ORDER — ONDANSETRON HCL 4 MG/2ML IJ SOLN
4.0000 mg | Freq: Once | INTRAMUSCULAR | Status: AC
  Administered 2022-11-17: 4 mg via INTRAVENOUS
  Filled 2022-11-17: qty 2

## 2022-11-17 MED ORDER — PROCHLORPERAZINE EDISYLATE 10 MG/2ML IJ SOLN
10.0000 mg | Freq: Once | INTRAMUSCULAR | Status: AC
  Administered 2022-11-17: 10 mg via INTRAVENOUS
  Filled 2022-11-17: qty 2

## 2022-11-17 MED ORDER — DEXTROSE 50 % IV SOLN
1.0000 | Freq: Once | INTRAVENOUS | Status: AC
  Administered 2022-11-17: 50 mL via INTRAVENOUS

## 2022-11-17 MED ORDER — SODIUM CHLORIDE 0.9 % IV SOLN: 250.0000 mL | INTRAVENOUS | Status: DC | PRN

## 2022-11-17 MED ORDER — SODIUM CHLORIDE 0.9% FLUSH
3.0000 mL | Freq: Two times a day (BID) | INTRAVENOUS | Status: DC
  Administered 2022-11-17: 3 mL via INTRAVENOUS

## 2022-11-17 NOTE — ED Triage Notes (Signed)
Pt arrives [facility] from home for N/V/Ha that started today after dialysis. Insulin pump reading 60 earlier today.

## 2022-11-17 NOTE — ED Notes (Signed)
Patient had episode of vomiting at this time. Zofran ordered and given.

## 2022-11-17 NOTE — ED Provider Triage Note (Signed)
Emergency Medicine Provider Triage Evaluation Note  Falesha Schommer , a 36 y.o. female  was evaluated in triage.  Pt complains of nausea vomiting and lightheadedness shakiness fatigue and some chest discomfort.  Seems that she had a dialysis session today has had numerous dialysis sessions this week because of fluid overload.  She barely makes urine anymore.  Her blood sugar was low today during dialysis they gave her some sugary snack and she returned home and had several more episodes of vomiting.  She states that her blood sugar was quite elevated this morning and she took her normal bolus insulin but has not eaten much since.  Review of Systems  Positive:  Negative:   Physical Exam  BP (!) 188/89   Pulse 97   Temp 98.9 F (37.2 C) (Oral)   Resp 18   SpO2 97%  Gen:   Awake, generally unwell appearing Resp:  Normal effort  MSK:   Moves extremities without difficulty  Other:    Medical Decision Making  Medically screening exam initiated at 9:01 PM.  Appropriate orders placed.  Lyndie Vanderloop was informed that the remainder of the evaluation will be completed by another provider, this initial triage assessment does not replace that evaluation, and the importance of remaining in the ED until their evaluation is complete.  CBG 40s Mildly hypertensive vital signs otherwise normal.  Will be moved back to major care.   Pati Gallo [place], [place] 11/17/22 2103

## 2022-11-17 NOTE — ED Provider Notes (Signed)
[facility] EMERGENCY DEPARTMENT Provider Note   CSN: [id number] Arrival date & time: 11/17/22  2019     History  Chief Complaint  Patient presents with   Nausea   Emesis   Shortness of Breath   Chest Pain    Angela [name] is a 36 y.o. female.  Patient is brought in by EMS for possible nausea vomiting headache that started today.  Patient level 5 caveat unable to give any information.  Patient's blood sugar was in the 40s on arrival and given an amp of D50 at triage brought immediately back to treatment room.  She is arousable to stimulation and able to give her name but not able to participate in history.  No other history available at this time.  Reportedly had dialysis today.  The history is provided by the EMS personnel and the patient.  Hypoglycemia Severity:  Severe Progression:  Improving Diabetic status:  Controlled with insulin Associated symptoms: altered mental status        Home Medications Prior to Admission medications   Medication Sig Start Date End Date Taking? Authorizing Provider  albuterol (PROVENTIL HFA) 108 (90 Base) MCG/ACT inhaler Inhale 2 puffs into the lungs every 6 (six) hours as needed for wheezing or shortness of breath. 08/19/21   [name], MD  amLODipine (NORVASC) 10 MG tablet Take 1 tablet (10 mg total) by mouth every morning. Needs to be seen prior to next refill request 08/24/22   [name], MD  benzonatate (TESSALON) 100 MG capsule Take 1 capsule (100 mg total) by mouth 2 (two) times daily as needed for cough. Patient not taking: Reported on 12/01/2021 08/19/21   [name], MD  busPIRone (BUSPAR) 15 MG tablet TAKE ONE TABLET BY MOUTH EVERYDAY AT BEDTIME 11/17/22   [name], MD  COMFORT EZ PEN NEEDLES 32G X 4 MM MISC USE TO INJECT insulin ONCE DAILY AS DIRECTED 09/23/22   [name], MD  Continuous Blood Gluc Sensor (DEXCOM G6 SENSOR) MISC APPLY 1 SENSOR EVERY 10 DAYS 07/15/21   [provider]  Continuous Blood Gluc Sensor (DEXCOM G6 SENSOR) MISC SMARTSIG:1 Topical Every 10 Days 10/11/21   [provider]  Continuous Blood Gluc Transmit (DEXCOM G6 TRANSMITTER) MISC See admin instructions. 03/04/21   [provider]  diltiazem (CARDIZEM CD) 240 MG 24 hr capsule Take 240 mg by mouth daily. 08/27/21   [provider]  escitalopram (LEXAPRO) 20 MG tablet TAKE ONE TABLET BY MOUTH ONCE DAILY 11/17/22   [name], MD  furosemide (LASIX) 40 MG tablet Take 2 tablets (80 mg total) by mouth daily. 12/01/21   Janina Mayo, MD  gabapentin (NEURONTIN) 300 MG capsule TAKE TWO CAPSULES BY MOUTH EVERY MORNING and TAKE TWO CAPSULES BY MOUTH EVERYDAY AT BEDTIME 11/17/22   [name], MD  Galcanezumab-gnlm (EMGALITY) 120 MG/ML SOAJ Inject 120 mg into the skin every 30 (thirty) days. Patient not taking: Reported on 12/01/2021 01/05/21   [name], MD  hydrALAZINE (APRESOLINE) 50 MG tablet TAKE TWO TABLETS BY MOUTH EVERY MORNING and TAKE TWO TABLETS BY MOUTH AT NOON and TAKE TWO TABLETS BY MOUTH EVERYDAY AT BEDTIME 12/30/21   [name], MD  Insulin Syringe-Needle U-100 (BD INSULIN SYRINGE ULTRAFINE) 31G X 15/64" 1 ML MISC Used to give daily insulin injections. 04/24/18   [name], MD  ketoconazole (NIZORAL) 2 % shampoo Apply 1 application topically 2 (two) times a week. Wash off  after each use Patient not taking: Reported on 01/06/2022 08/19/21   [name], MD  levETIRAcetam (KEPPRA) 250 MG tablet Take 1 tablet (250 mg total) by mouth every Monday, Wednesday, and Friday at 6 PM. Please take along with Keppra 500 mg p.o. daily on the days of dialysis, after dialysis. 10/22/21   [name], MD  levETIRAcetam (KEPPRA) 500 MG tablet TAKE 1 TABLET(500 MG) BY MOUTH DAILY AS DIRECTED 03/16/22   [name], MD  levothyroxine (SYNTHROID) 175 MCG tablet Take 175 mcg by mouth every morning. 06/09/21   [provider]   losartan (COZAAR) 50 MG tablet Take 1.5 tablets (75 mg total) by mouth daily. 12/01/21   Janina Mayo, MD  multivitamin (RENA-VIT) TABS tablet Take 1 tablet by mouth at bedtime. 10/01/21   [provider]  NOVOLOG 100 UNIT/ML injection USE AS DIRECTED in insulin pump 11/01/21   [name], MD  ondansetron (ZOFRAN ODT) 4 MG disintegrating tablet Take 1 tablet (4 mg total) by mouth daily as needed for nausea or vomiting. 12/10/21   [name], MD  pantoprazole (PROTONIX) 40 MG tablet Take 1 tablet (40 mg total) by mouth daily. 12/10/21   [name], MD  pravastatin (PRAVACHOL) 20 MG tablet TAKE ONE TABLET BY MOUTH EVERYDAY AT BEDTIME 09/22/22   [name], MD  Rimegepant Sulfate (NURTEC) 75 MG TBDP Take 75 mg by mouth daily as needed. For migraines. Take as close to onset of migraine as possible. One daily maximum. Patient not taking: Reported on 12/01/2021 01/05/21   [name], MD  TRESIBA FLEXTOUCH 100 UNIT/ML FlexTouch Pen Inject 12 Units into the skin daily as needed (high blood sugar). 05/31/21   [provider]  metoCLOPramide (REGLAN) 5 MG tablet Take 1 tablet (5 mg total) by mouth 3 (three) times daily as needed for nausea or vomiting. Patient not taking: Reported on 06/25/2020 02/28/19 06/25/20  [name], MD      Allergies    Ferumoxytol, Lisinopril, Sulfamethoxazole, and Trimethoprim    Review of Systems   Review of Systems  Physical Exam Updated Vital Signs BP (!) 188/89   Pulse 97   Temp 98.9 F (37.2 C) (Oral)   Resp 18   SpO2 97%  Physical Exam Vitals and nursing note reviewed.  Constitutional:      General: She is not in acute distress.    Appearance: She is well-developed.  HENT:     Head: Normocephalic and atraumatic.  Eyes:     Conjunctiva/sclera: Conjunctivae normal.  Cardiovascular:     Rate and Rhythm: Normal rate and regular rhythm.     Heart sounds: Murmur heard.  Pulmonary:     Effort:  Pulmonary effort is normal. No respiratory distress.     Breath sounds: Normal breath sounds.  Abdominal:     Palpations: Abdomen is soft.     Tenderness: There is no abdominal tenderness.  Musculoskeletal:        General: No swelling. Normal range of motion.     Cervical back: Neck supple.     Right lower leg: No edema.     Left lower leg: No edema.     Comments: Fistula left upper arm with positive thrill  Skin:    General: Skin is warm and dry.     Capillary Refill: Capillary refill takes less than 2 seconds.  Neurological:     Mental Status: She is lethargic.     Comments:  She will open eyes to voice and can tell me her name.  Unable to carry on a conversation.  Not participating in neurologic exam otherwise     ED Results / Procedures / Treatments   Labs (all labs ordered are listed, but only abnormal results are displayed) Labs Reviewed  BASIC METABOLIC PANEL - Abnormal; Notable for the following components:      Result Value   Glucose, Bld 136 (*)    Creatinine, Ser 2.58 (*)    GFR, Estimated 24 (*)    All other components within normal limits  HEPATIC FUNCTION PANEL - Abnormal; Notable for the following components:   AST 64 (*)    ALT 52 (*)    Alkaline Phosphatase 178 (*)    All other components within normal limits  URINALYSIS, ROUTINE W REFLEX MICROSCOPIC - Abnormal; Notable for the following components:   APPearance HAZY (*)    Glucose, UA 50 (*)    Protein, ur >=300 (*)    All other components within normal limits  CBC WITH DIFFERENTIAL/PLATELET - Abnormal; Notable for the following components:   RBC 2.38 (*)    Hemoglobin 7.0 (*)    HCT 21.9 (*)    Platelets 128 (*)    Lymphs Abs 0.4 (*)    All other components within normal limits  CBG MONITORING, ED - Abnormal; Notable for the following components:   Glucose-Capillary 44 (*)    All other components within normal limits  CBG MONITORING, ED - Abnormal; Notable for the following components:    Glucose-Capillary 140 (*)    All other components within normal limits  CBG MONITORING, ED - Abnormal; Notable for the following components:   Glucose-Capillary 111 (*)    All other components within normal limits  I-STAT BETA HCG BLOOD, ED ([facility], [facility], [facility] ONLY)  CBG MONITORING, ED  CBG MONITORING, ED  CBG MONITORING, ED  CBG MONITORING, ED  TROPONIN I (HIGH SENSITIVITY)  TROPONIN I (HIGH SENSITIVITY)    EKG EKG Interpretation  Date/Time:  Thursday November 17 2022 21:22:33 EST Ventricular Rate:  98 PR Interval:  155 QRS Duration: 89 QT Interval:  368 QTC Calculation: 470 R Axis:   -23 Text Interpretation: Sinus rhythm Borderline left axis deviation Nonspecific T abnrm, anterolateral leads Baseline wander in lead(s) V4 V5 V6 No significant change since prior 8/23 Confirmed by Aletta Edouard [phone]) on 11/17/2022 9:24:27 PM  Radiology CT Head Wo Contrast  Result Date: 11/18/2022 CLINICAL DATA:  Sudden onset severe headache. Also nausea, vomiting and lightheadedness. End-stage renal failure patient on dialysis. EXAM: CT HEAD WITHOUT CONTRAST TECHNIQUE: Contiguous axial images were obtained from the base of the skull through the vertex without intravenous contrast. RADIATION DOSE REDUCTION: This exam was performed according to the departmental dose-optimization program which includes automated exposure control, adjustment of the mA and/or kV according to patient size and/or use of iterative reconstruction technique. COMPARISON:  MRI brain and head CT both 04/05/2022. FINDINGS: Brain: There is mild cerebral cortical atrophy, more than typically seen at this age, with normal attenuation and differentiation of the gray and white matter. The brainstem and cerebellum are unremarkable. No infarct, hemorrhage or mass are seen. Ventricles are normal in size and position. The basal cisterns are clear. Vascular: Age-advanced calcific plaques distal vertebral arteries and both siphons. No hyperdense  central vessels. Skull: Negative for fractures or focal lesions. Sinuses/Orbits: No acute findings. Other: None. IMPRESSION: 1. No acute intracranial CT findings or interval changes. 2. 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 Electronically Signed   By: Telford Nab M.D.   On: 11/18/2022 01:02   US Abdomen Limited RUQ (LIVER/GB)  Result Date: 11/17/2022 CLINICAL DATA:  Elevated liver function tests. EXAM: ULTRASOUND ABDOMEN LIMITED RIGHT UPPER QUADRANT COMPARISON:  August 01, 2022 FINDINGS: Gallbladder: No gallstones or wall thickening visualized (2.70 mm). No sonographic Murphy sign noted by sonographer. Common bile duct: Diameter: 3.74 mm Liver: No focal lesion identified. Diffusely increased echogenicity of the liver parenchyma is noted. Portal vein is patent on color Doppler imaging with normal direction of blood flow towards the liver. Other: None. IMPRESSION: Hepatic steatosis without focal liver lesions. Electronically Signed   By: Virgina Norfolk M.D.   On: 11/17/2022 23:31   DG Chest Port 1 View  Result Date: 11/17/2022 CLINICAL DATA:  Altered mental status and shortness of breath EXAM: PORTABLE CHEST 1 VIEW COMPARISON:  08/01/2022 FINDINGS: Cardiac shadow is enlarged but stable. The lungs are well aerated bilaterally. No focal infiltrate is seen. Old rib fractures are noted on the right. IMPRESSION: No active disease. Electronically Signed   By: Inez Catalina M.D.   On: 11/17/2022 21:57    Procedures Procedures    Medications Ordered in ED Medications  dextrose 50 % solution 50 mL (50 mLs Intravenous Given 11/17/22 2103)  ondansetron (ZOFRAN) injection 4 mg (4 mg Intravenous Given 11/17/22 2154)  prochlorperazine (COMPAZINE) injection 10 mg (10 mg Intravenous Given 11/17/22 2335)    ED Course/ Medical Decision Making/ A&P Clinical Course as of 11/18/22 1053  Thu Nov 17, 2022  2104 Initial cbg 44 [WF]   2200 Chest x-ray interpreted by me as no focal infiltrates.  Does have cardiomegaly.  Awaiting radiology reading. [MB]  2201 Patient vomited once since she has been here.  She says she is having some chest pain.  She said she has had this pain before.  Troponins added onto prior testing and Zofran ordered [MB]  2334 Patient continued to planing of severe headache.  She said she does have a history of migraines but does not usually get headaches after dialysis.  Head CT pending.  Compazine ordered.  Abdominal ultrasound does not show any obstructive process. [MB]    Clinical Course User Index [MB] Hayden Rasmussen, MD [WF] Tedd Sias, [place]                           Medical Decision Making Amount and/or Complexity of Data Reviewed Labs: ordered. Radiology: ordered.  Risk Prescription drug management.   This patient complains of headache, nausea vomiting, hypoglycemia; this involves an extensive number of treatment Options and is a complaint that carries with it a high risk of complications and morbidity. The differential includes migraine, bleed, stroke, complication of dialysis, metabolic derangement  I ordered, reviewed and interpreted labs, which included CBC with normal white count, hemoglobin lower than priors, chemistries consistent with end-stage renal disease, LFTs mildly elevated, pregnancy negative, troponin negative, urinalysis without clear signs of infection, blood sugar stable I ordered medication IV glucose, Zofran and Compazine for headache and reviewed PMP when indicated. I ordered imaging studies which included chest x-ray, right upper quadrant ultrasound, CT head and I independently    visualized and interpreted imaging which showed hepatic steatosis no acute findings Previous records obtained and reviewed in epic, admitted back in August Cardiac monitoring reviewed, normal sinus rhythm Social determinants considered, barriers of depression physical  inactivity and  housing insecurity Critical Interventions: None  After the interventions stated above, I reevaluated the patient and found patient to be symptomatically improved Admission and further testing considered, her care is signed out to Dr. Waverly Ferrari to follow-up on results of CT head and if she is trending for possible admission versus discharge.         Final Clinical Impression(s) / ED Diagnoses Final diagnoses:  Nausea and vomiting, unspecified vomiting type  Hypoglycemia  Generalized headache    Rx / DC Orders ED Discharge Orders     None         Hayden Rasmussen, MD 11/18/22 1056

## 2022-11-17 NOTE — Telephone Encounter (Signed)
Requested Prescriptions  Pending Prescriptions Disp Refills   busPIRone (BUSPAR) 15 MG tablet [Pharmacy Med Name: buspirone 15 mg tablet] 90 tablet 0    Sig: TAKE ONE TABLET BY MOUTH EVERYDAY AT BEDTIME     Psychiatry: Anxiolytics/Hypnotics - Non-controlled Failed - 11/17/2022  1:16 PM      Failed - Valid encounter within last 12 months    Recent Outpatient Visits           1 week ago Multiple thyroid nodules   [place], Deborah B, MD   1 year ago Hospital discharge follow-up   [place], Deborah B, MD   1 year ago Bronchitis with asthma, acute   [place], Deborah B, MD   1 year ago [place], [place], [place]   1 year ago Other insomnia   [place], Neoma Laming B, MD               gabapentin (NEURONTIN) 300 MG capsule [Pharmacy Med Name: gabapentin 300 mg capsule] 360 capsule 0    Sig: TAKE TWO CAPSULES BY MOUTH EVERY MORNING and TAKE TWO CAPSULES BY MOUTH EVERYDAY AT BEDTIME     Neurology: Anticonvulsants - gabapentin Failed - 11/17/2022  1:16 PM      Failed - Cr in normal range and within 360 days    Creat  Date Value Ref Range Status  02/04/2016 2.31 (H) 0.50 - 1.10 mg/dL Final   Creatinine, Ser  Date Value Ref Range Status  08/01/2022 6.06 (H) 0.44 - 1.00 mg/dL Final   Creatinine, POC  Date Value Ref Range Status  06/23/2017 200 mg/dL Final   Creatinine, Urine  Date Value Ref Range Status  02/04/2016 120 20 - 320 mg/dL Final         Failed - Valid encounter within last 12 months    Recent Outpatient Visits           1 week ago Multiple thyroid nodules   [place], Deborah B, MD   1 year ago Hospital discharge follow-up   [place], Deborah B, MD   1 year ago Bronchitis  with asthma, acute   [place], Deborah B, MD   1 year ago [place], [place], [place]   1 year ago Other insomnia   [place], MD              Passed - Completed PHQ-2 or PHQ-9 in the last 360 days       escitalopram (LEXAPRO) 20 MG tablet [Pharmacy Med Name: escitalopram 20 mg tablet] 90 tablet 0    Sig: TAKE ONE TABLET BY MOUTH ONCE DAILY     Psychiatry:  Antidepressants - SSRI Failed - 11/17/2022  1:16 PM      Failed - Valid encounter within last 6 months    Recent Outpatient Visits           1 week ago Multiple thyroid nodules   [name], MD   1 year ago Hospital discharge follow-up   [place] [name], MD   1 year ago Bronchitis with asthma, acute  [place] [name], MD   1 year ago [place], [place]   1 year ago Other insomnia   [place], MD              Passed - Completed PHQ-2 or PHQ-9 in the last 360 days

## 2022-11-18 ENCOUNTER — Emergency Department (HOSPITAL_COMMUNITY): Payer: Medicaid Other

## 2022-11-18 LAB — URINALYSIS, ROUTINE W REFLEX MICROSCOPIC
Bacteria, UA: NONE SEEN
Bilirubin Urine: NEGATIVE
Glucose, UA: 50 mg/dL — AB
Hgb urine dipstick: NEGATIVE
Ketones, ur: NEGATIVE mg/dL
Leukocytes,Ua: NEGATIVE
Nitrite: NEGATIVE
Protein, ur: >=300 mg/dL — AB
Specific Gravity, Urine: 1.009 (ref 1.005–1.030)
pH: 8 (ref 5.0–8.0)

## 2022-11-18 LAB — CBG MONITORING, ED
Glucose-Capillary: 73 mg/dL (ref 70–99)
Glucose-Capillary: 79 mg/dL (ref 70–99)

## 2022-11-18 LAB — TROPONIN I (HIGH SENSITIVITY): Troponin I (High Sensitivity): 12 ng/L (ref <18)

## 2022-11-18 MED ORDER — ONDANSETRON 4 MG PO TBDP: ORAL_TABLET | ORAL | 0 refills | Status: DC

## 2022-11-18 NOTE — ED Provider Notes (Signed)
Patient signed out to me by Dr. Melina Copa.  CT head pending at time of signout.  This has been performed and reviewed, no acute abnormality.  Blood sugars have been stable.  Upon recheck she is resting comfortably.  She reports that her symptoms are resolved she feels significant improvement.  She is appropriate for discharge.   Orpah Greek, MD 11/18/22 [phone]

## 2022-11-18 NOTE — ED Notes (Signed)
Patient transported to CT in NAD.

## 2022-11-18 NOTE — ED Notes (Signed)
Patient tolerated some apple juice at this time.

## 2022-11-23 ENCOUNTER — Encounter: Payer: Self-pay | Admitting: Internal Medicine

## 2022-11-23 DIAGNOSIS — R002 Palpitations: Secondary | ICD-10-CM

## 2022-11-25 ENCOUNTER — Ambulatory Visit: Payer: Medicaid Other | Attending: Internal Medicine

## 2022-11-25 DIAGNOSIS — R002 Palpitations: Secondary | ICD-10-CM

## 2022-11-25 NOTE — Progress Notes (Unsigned)
Enrolled patient for a 7 day Zio XT monitor to be mailed to patients home.  

## 2022-11-25 NOTE — Telephone Encounter (Signed)
Janina Mayo, MD  Renan Danese, Belinda Block, RN Can you order a 7 day ziopatch for her? Thank you !

## 2022-12-01 ENCOUNTER — Other Ambulatory Visit: Payer: Self-pay | Admitting: Internal Medicine

## 2022-12-01 DIAGNOSIS — G40909 Epilepsy, unspecified, not intractable, without status epilepticus: Secondary | ICD-10-CM

## 2022-12-01 NOTE — Telephone Encounter (Signed)
Requested medication (s) are due for refill today:routing for review  Requested medication (s) are on the active medication list: yes  Last refill:  10/21/22  Future visit scheduled: yes  Notes to clinic:  Unable to refill per protocol due to failed labs, no updated results.      Requested Prescriptions  Pending Prescriptions Disp Refills   levETIRAcetam (KEPPRA) 250 MG tablet [Pharmacy Med Name: LEVETIRACETAM 250MG  TABLETS] 36 tablet 1    Sig: TAKE 1 TABLET EVERY MONDAY, WEDNESDAY, FRIDAY AT 6 PM. TAKE ALONG WITH 500 MG DAILY ON DAYS OF DIALYSIS, AFTER DIALYSIS     Neurology:  Anticonvulsants - levetiracetam Failed - 12/01/2022  3:04 PM      Failed - Cr in normal range and within 360 days    Creat  Date Value Ref Range Status  02/04/2016 2.31 (H) 0.50 - 1.10 mg/dL Final   Creatinine, Ser  Date Value Ref Range Status  11/17/2022 2.58 (H) 0.44 - 1.00 mg/dL Final   Creatinine, POC  Date Value Ref Range Status  06/23/2017 200 mg/dL Final   Creatinine, Urine  Date Value Ref Range Status  02/04/2016 120 20 - 320 mg/dL Final         Failed - Valid encounter within last 12 months    Recent Outpatient Visits           3 weeks ago Multiple thyroid nodules   [place], Deborah B, MD   1 year ago Hospital discharge follow-up   [place], Deborah B, MD   1 year ago Bronchitis with asthma, acute   [place], Deborah B, MD   1 year ago [place], [place]   1 year ago Other insomnia   [place], MD              Passed - Completed PHQ-2 or PHQ-9 in the last 360 days

## 2022-12-14 ENCOUNTER — Ambulatory Visit
Admission: RE | Admit: 2022-12-14 | Discharge: 2022-12-14 | Disposition: A | Discharge status: Home / Self Care | Payer: Medicaid Other | Source: Ambulatory Visit | Attending: Internal Medicine | Admitting: Internal Medicine

## 2022-12-14 DIAGNOSIS — R590 Localized enlarged lymph nodes: Secondary | ICD-10-CM

## 2022-12-16 ENCOUNTER — Emergency Department (HOSPITAL_COMMUNITY): Payer: Medicaid Other

## 2022-12-16 ENCOUNTER — Telehealth: Payer: Self-pay | Admitting: Internal Medicine

## 2022-12-16 ENCOUNTER — Other Ambulatory Visit: Payer: Self-pay | Admitting: Internal Medicine

## 2022-12-16 ENCOUNTER — Inpatient Hospital Stay (HOSPITAL_COMMUNITY)
Admission: EM | Admit: 2022-12-16 | Discharge: 2022-12-18 | DRG: 637 | Disposition: A | Discharge status: Home / Self Care | Payer: Medicaid Other | Attending: Family Medicine | Admitting: Family Medicine

## 2022-12-16 DIAGNOSIS — K219 Gastro-esophageal reflux disease without esophagitis: Secondary | ICD-10-CM | POA: Diagnosis present

## 2022-12-16 DIAGNOSIS — Z882 Allergy status to sulfonamides status: Secondary | ICD-10-CM

## 2022-12-16 DIAGNOSIS — Z79890 Hormone replacement therapy (postmenopausal): Secondary | ICD-10-CM

## 2022-12-16 DIAGNOSIS — R569 Unspecified convulsions: Secondary | ICD-10-CM | POA: Diagnosis present

## 2022-12-16 DIAGNOSIS — Z794 Long term (current) use of insulin: Secondary | ICD-10-CM | POA: Diagnosis not present

## 2022-12-16 DIAGNOSIS — E782 Mixed hyperlipidemia: Secondary | ICD-10-CM | POA: Diagnosis present

## 2022-12-16 DIAGNOSIS — Z79899 Other long term (current) drug therapy: Secondary | ICD-10-CM

## 2022-12-16 DIAGNOSIS — K3184 Gastroparesis: Secondary | ICD-10-CM | POA: Diagnosis present

## 2022-12-16 DIAGNOSIS — E1042 Type 1 diabetes mellitus with diabetic polyneuropathy: Secondary | ICD-10-CM | POA: Diagnosis present

## 2022-12-16 DIAGNOSIS — E1010 Type 1 diabetes mellitus with ketoacidosis without coma: Principal | ICD-10-CM

## 2022-12-16 DIAGNOSIS — Z888 Allergy status to other drugs, medicaments and biological substances status: Secondary | ICD-10-CM

## 2022-12-16 DIAGNOSIS — E039 Hypothyroidism, unspecified: Secondary | ICD-10-CM | POA: Diagnosis present

## 2022-12-16 DIAGNOSIS — N319 Neuromuscular dysfunction of bladder, unspecified: Secondary | ICD-10-CM | POA: Diagnosis present

## 2022-12-16 DIAGNOSIS — I10 Essential (primary) hypertension: Secondary | ICD-10-CM | POA: Diagnosis not present

## 2022-12-16 DIAGNOSIS — R0789 Other chest pain: Secondary | ICD-10-CM

## 2022-12-16 DIAGNOSIS — E103593 Type 1 diabetes mellitus with proliferative diabetic retinopathy without macular edema, bilateral: Secondary | ICD-10-CM | POA: Diagnosis present

## 2022-12-16 DIAGNOSIS — Z823 Family history of stroke: Secondary | ICD-10-CM

## 2022-12-16 DIAGNOSIS — E1043 Type 1 diabetes mellitus with diabetic autonomic (poly)neuropathy: Secondary | ICD-10-CM | POA: Diagnosis present

## 2022-12-16 DIAGNOSIS — I120 Hypertensive chronic kidney disease with stage 5 chronic kidney disease or end stage renal disease: Secondary | ICD-10-CM | POA: Diagnosis present

## 2022-12-16 DIAGNOSIS — N186 End stage renal disease: Secondary | ICD-10-CM | POA: Diagnosis not present

## 2022-12-16 DIAGNOSIS — G43909 Migraine, unspecified, not intractable, without status migrainosus: Secondary | ICD-10-CM | POA: Diagnosis present

## 2022-12-16 DIAGNOSIS — Z8249 Family history of ischemic heart disease and other diseases of the circulatory system: Secondary | ICD-10-CM | POA: Diagnosis not present

## 2022-12-16 DIAGNOSIS — Z87891 Personal history of nicotine dependence: Secondary | ICD-10-CM

## 2022-12-16 DIAGNOSIS — N189 Chronic kidney disease, unspecified: Secondary | ICD-10-CM | POA: Diagnosis present

## 2022-12-16 DIAGNOSIS — E1022 Type 1 diabetes mellitus with diabetic chronic kidney disease: Secondary | ICD-10-CM | POA: Diagnosis present

## 2022-12-16 DIAGNOSIS — Z820 Family history of epilepsy and other diseases of the nervous system: Secondary | ICD-10-CM

## 2022-12-16 DIAGNOSIS — Z833 Family history of diabetes mellitus: Secondary | ICD-10-CM

## 2022-12-16 DIAGNOSIS — J45909 Unspecified asthma, uncomplicated: Secondary | ICD-10-CM | POA: Diagnosis present

## 2022-12-16 DIAGNOSIS — F419 Anxiety disorder, unspecified: Secondary | ICD-10-CM | POA: Diagnosis not present

## 2022-12-16 DIAGNOSIS — I160 Hypertensive urgency: Secondary | ICD-10-CM | POA: Diagnosis present

## 2022-12-16 DIAGNOSIS — E8770 Fluid overload, unspecified: Secondary | ICD-10-CM

## 2022-12-16 DIAGNOSIS — G40909 Epilepsy, unspecified, not intractable, without status epilepticus: Secondary | ICD-10-CM

## 2022-12-16 DIAGNOSIS — J4540 Moderate persistent asthma, uncomplicated: Secondary | ICD-10-CM

## 2022-12-16 DIAGNOSIS — F321 Major depressive disorder, single episode, moderate: Secondary | ICD-10-CM | POA: Diagnosis present

## 2022-12-16 DIAGNOSIS — M898X9 Other specified disorders of bone, unspecified site: Secondary | ICD-10-CM | POA: Diagnosis present

## 2022-12-16 DIAGNOSIS — Z992 Dependence on renal dialysis: Secondary | ICD-10-CM | POA: Diagnosis not present

## 2022-12-16 DIAGNOSIS — D631 Anemia in chronic kidney disease: Secondary | ICD-10-CM | POA: Diagnosis present

## 2022-12-16 DIAGNOSIS — R0602 Shortness of breath: Secondary | ICD-10-CM

## 2022-12-16 DIAGNOSIS — Z8349 Family history of other endocrine, nutritional and metabolic diseases: Secondary | ICD-10-CM

## 2022-12-16 DIAGNOSIS — E1110 Type 2 diabetes mellitus with ketoacidosis without coma: Secondary | ICD-10-CM | POA: Diagnosis present

## 2022-12-16 DIAGNOSIS — E1159 Type 2 diabetes mellitus with other circulatory complications: Secondary | ICD-10-CM | POA: Diagnosis present

## 2022-12-16 DIAGNOSIS — Z803 Family history of malignant neoplasm of breast: Secondary | ICD-10-CM

## 2022-12-16 DIAGNOSIS — F32A Depression, unspecified: Secondary | ICD-10-CM

## 2022-12-16 LAB — CBG MONITORING, ED
Glucose-Capillary: 135 mg/dL — ABNORMAL HIGH (ref 70–99)
Glucose-Capillary: 155 mg/dL — ABNORMAL HIGH (ref 70–99)
Glucose-Capillary: 212 mg/dL — ABNORMAL HIGH (ref 70–99)
Glucose-Capillary: 245 mg/dL — ABNORMAL HIGH (ref 70–99)
Glucose-Capillary: 311 mg/dL — ABNORMAL HIGH (ref 70–99)
Glucose-Capillary: 344 mg/dL — ABNORMAL HIGH (ref 70–99)
Glucose-Capillary: 409 mg/dL — ABNORMAL HIGH (ref 70–99)
Glucose-Capillary: 466 mg/dL — ABNORMAL HIGH (ref 70–99)
Glucose-Capillary: 539 mg/dL (ref 70–99)
Glucose-Capillary: 563 mg/dL (ref 70–99)
Glucose-Capillary: >600 mg/dL (ref 70–99)
Glucose-Capillary: >600 mg/dL (ref 70–99)
Glucose-Capillary: >600 mg/dL (ref 70–99)

## 2022-12-16 LAB — BASIC METABOLIC PANEL
Anion gap: 16 — ABNORMAL HIGH (ref 5–15)
Anion gap: 12 (ref 5–15)
BUN: 27 mg/dL — ABNORMAL HIGH (ref 6–20)
BUN: 28 mg/dL — ABNORMAL HIGH (ref 6–20)
CO2: 20 mmol/L — ABNORMAL LOW (ref 22–32)
CO2: 23 mmol/L (ref 22–32)
Calcium: 9.1 mg/dL (ref 8.9–10.3)
Calcium: 9.5 mg/dL (ref 8.9–10.3)
Chloride: 89 mmol/L — ABNORMAL LOW (ref 98–111)
Chloride: 95 mmol/L — ABNORMAL LOW (ref 98–111)
Creatinine, Ser: 4.89 mg/dL — ABNORMAL HIGH (ref 0.44–1.00)
Creatinine, Ser: 5.19 mg/dL — ABNORMAL HIGH (ref 0.44–1.00)
GFR, Estimated: 11 mL/min — ABNORMAL LOW (ref >60)
GFR, Estimated: 10 mL/min — ABNORMAL LOW (ref >60)
Glucose, Bld: 942 mg/dL (ref 70–99)
Glucose, Bld: 236 mg/dL — ABNORMAL HIGH (ref 70–99)
Potassium: 4.6 mmol/L (ref 3.5–5.1)
Potassium: 3.7 mmol/L (ref 3.5–5.1)
Sodium: 125 mmol/L — ABNORMAL LOW (ref 135–145)
Sodium: 130 mmol/L — ABNORMAL LOW (ref 135–145)

## 2022-12-16 LAB — CBC WITH DIFFERENTIAL/PLATELET
Abs Immature Granulocytes: 0.01 10*3/uL (ref 0.00–0.07)
Basophils Absolute: 0.1 10*3/uL (ref 0.0–0.1)
Basophils Relative: 1 %
Eosinophils Absolute: 0.3 10*3/uL (ref 0.0–0.5)
Eosinophils Relative: 5 %
HCT: 22.2 % — ABNORMAL LOW (ref 36.0–46.0)
Hemoglobin: 7.3 g/dL — ABNORMAL LOW (ref 12.0–15.0)
Immature Granulocytes: 0 %
Lymphocytes Relative: 22 %
Lymphs Abs: 1.3 10*3/uL (ref 0.7–4.0)
MCH: 28.9 pg (ref 26.0–34.0)
MCHC: 32.9 g/dL (ref 30.0–36.0)
MCV: 87.7 fL (ref 80.0–100.0)
Monocytes Absolute: 0.8 10*3/uL (ref 0.1–1.0)
Monocytes Relative: 13 %
Neutro Abs: 3.5 10*3/uL (ref 1.7–7.7)
Neutrophils Relative %: 59 %
Platelets: 103 10*3/uL — ABNORMAL LOW (ref 150–400)
RBC: 2.53 MIL/uL — ABNORMAL LOW (ref 3.87–5.11)
RDW: 13 % (ref 11.5–15.5)
WBC: 5.9 10*3/uL (ref 4.0–10.5)
nRBC: 0 % (ref 0.0–0.2)

## 2022-12-16 LAB — URINALYSIS, ROUTINE W REFLEX MICROSCOPIC
Bilirubin Urine: NEGATIVE
Glucose, UA: >=500 mg/dL — AB
Hgb urine dipstick: NEGATIVE
Ketones, ur: NEGATIVE mg/dL
Leukocytes,Ua: NEGATIVE
Nitrite: NEGATIVE
Protein, ur: >=300 mg/dL — AB
Specific Gravity, Urine: 1.013 (ref 1.005–1.030)
pH: 7 (ref 5.0–8.0)

## 2022-12-16 LAB — HEPATIC FUNCTION PANEL
ALT: 44 U/L (ref 0–44)
AST: 57 U/L — ABNORMAL HIGH (ref 15–41)
Albumin: 3.6 g/dL (ref 3.5–5.0)
Alkaline Phosphatase: 211 U/L — ABNORMAL HIGH (ref 38–126)
Bilirubin, Direct: <0.1 mg/dL (ref 0.0–0.2)
Total Bilirubin: 0.8 mg/dL (ref 0.3–1.2)
Total Protein: 6.7 g/dL (ref 6.5–8.1)

## 2022-12-16 LAB — CBC
HCT: 28.6 % — ABNORMAL LOW (ref 36.0–46.0)
Hemoglobin: 8.7 g/dL — ABNORMAL LOW (ref 12.0–15.0)
MCH: 28.5 pg (ref 26.0–34.0)
MCHC: 30.4 g/dL (ref 30.0–36.0)
MCV: 93.8 fL (ref 80.0–100.0)
Platelets: 92 10*3/uL — ABNORMAL LOW (ref 150–400)
RBC: 3.05 MIL/uL — ABNORMAL LOW (ref 3.87–5.11)
RDW: 13.3 % (ref 11.5–15.5)
WBC: 4.8 10*3/uL (ref 4.0–10.5)
nRBC: 0 % (ref 0.0–0.2)

## 2022-12-16 LAB — I-STAT CHEM 8, ED
BUN: 27 mg/dL — ABNORMAL HIGH (ref 6–20)
Calcium, Ion: 1.22 mmol/L (ref 1.15–1.40)
Chloride: 90 mmol/L — ABNORMAL LOW (ref 98–111)
Creatinine, Ser: 5.1 mg/dL — ABNORMAL HIGH (ref 0.44–1.00)
Glucose, Bld: >700 mg/dL (ref 70–99)
HCT: 28 % — ABNORMAL LOW (ref 36.0–46.0)
Hemoglobin: 9.5 g/dL — ABNORMAL LOW (ref 12.0–15.0)
Potassium: 4.6 mmol/L (ref 3.5–5.1)
Sodium: 124 mmol/L — ABNORMAL LOW (ref 135–145)
TCO2: 21 mmol/L — ABNORMAL LOW (ref 22–32)

## 2022-12-16 LAB — I-STAT VENOUS BLOOD GAS, ED
Acid-base deficit: 6 mmol/L — ABNORMAL HIGH (ref 0.0–2.0)
Bicarbonate: 20 mmol/L (ref 20.0–28.0)
Calcium, Ion: 1.21 mmol/L (ref 1.15–1.40)
HCT: 28 % — ABNORMAL LOW (ref 36.0–46.0)
Hemoglobin: 9.5 g/dL — ABNORMAL LOW (ref 12.0–15.0)
O2 Saturation: 98 %
Potassium: 4.6 mmol/L (ref 3.5–5.1)
Sodium: 123 mmol/L — ABNORMAL LOW (ref 135–145)
TCO2: 21 mmol/L — ABNORMAL LOW (ref 22–32)
pCO2, Ven: 42.3 mmHg — ABNORMAL LOW (ref 44–60)
pH, Ven: 7.283 (ref 7.25–7.43)
pO2, Ven: 124 mmHg — ABNORMAL HIGH (ref 32–45)

## 2022-12-16 LAB — TROPONIN I (HIGH SENSITIVITY)
Troponin I (High Sensitivity): 29 ng/L — ABNORMAL HIGH (ref <18)
Troponin I (High Sensitivity): 29 ng/L — ABNORMAL HIGH (ref <18)

## 2022-12-16 LAB — HIV ANTIBODY (ROUTINE TESTING W REFLEX): HIV Screen 4th Generation wRfx: NONREACTIVE

## 2022-12-16 LAB — I-STAT BETA HCG BLOOD, ED (MC, WL, AP ONLY)
I-stat hCG, quantitative: <5 m[IU]/mL (ref <5)
I-stat hCG, quantitative: <5 m[IU]/mL (ref <5)

## 2022-12-16 LAB — PHOSPHORUS: Phosphorus: 3.9 mg/dL (ref 2.5–4.6)

## 2022-12-16 LAB — LIPASE, BLOOD: Lipase: 45 U/L (ref 11–51)

## 2022-12-16 LAB — ALBUMIN: Albumin: 3.2 g/dL — ABNORMAL LOW (ref 3.5–5.0)

## 2022-12-16 LAB — BETA-HYDROXYBUTYRIC ACID: Beta-Hydroxybutyric Acid: 0.14 mmol/L (ref 0.05–0.27)

## 2022-12-16 MED ORDER — PRAVASTATIN SODIUM 40 MG PO TABS
20.0000 mg | ORAL_TABLET | Freq: Every day | ORAL | Status: DC
  Administered 2022-12-16 – 2022-12-17 (×2): 20 mg via ORAL
  Filled 2022-12-16: qty 2
  Filled 2022-12-16: qty 1

## 2022-12-16 MED ORDER — DEXTROSE IN LACTATED RINGERS 5 % IV SOLN: INTRAVENOUS | Status: DC

## 2022-12-16 MED ORDER — LEVETIRACETAM IN NACL 1000 MG/100ML IV SOLN
1000.0000 mg | Freq: Once | INTRAVENOUS | Status: AC
  Administered 2022-12-16: 1000 mg via INTRAVENOUS
  Filled 2022-12-16: qty 100

## 2022-12-16 MED ORDER — LACTATED RINGERS IV SOLN: INTRAVENOUS | Status: DC

## 2022-12-16 MED ORDER — LEVETIRACETAM 250 MG PO TABS: 250.0000 mg | ORAL_TABLET | ORAL | Status: DC

## 2022-12-16 MED ORDER — HEPARIN SODIUM (PORCINE) 5000 UNIT/ML IJ SOLN
5000.0000 [IU] | Freq: Three times a day (TID) | INTRAMUSCULAR | Status: DC
  Administered 2022-12-16 – 2022-12-18 (×5): 5000 [IU] via SUBCUTANEOUS
  Filled 2022-12-16 (×5): qty 1

## 2022-12-16 MED ORDER — BUSPIRONE HCL 15 MG PO TABS
15.0000 mg | ORAL_TABLET | Freq: Every day | ORAL | Status: DC
  Administered 2022-12-16 – 2022-12-17 (×2): 15 mg via ORAL
  Filled 2022-12-16: qty 1
  Filled 2022-12-16: qty 2

## 2022-12-16 MED ORDER — GABAPENTIN 300 MG PO CAPS
300.0000 mg | ORAL_CAPSULE | Freq: Two times a day (BID) | ORAL | Status: DC
  Administered 2022-12-16 – 2022-12-18 (×4): 300 mg via ORAL
  Filled 2022-12-16 (×4): qty 1

## 2022-12-16 MED ORDER — LACTATED RINGERS IV BOLUS
20.0000 mL/kg | Freq: Once | INTRAVENOUS | Status: AC
  Administered 2022-12-16: 1160 mL via INTRAVENOUS

## 2022-12-16 MED ORDER — LEVOTHYROXINE SODIUM 75 MCG PO TABS
175.0000 ug | ORAL_TABLET | Freq: Every morning | ORAL | Status: DC
  Administered 2022-12-17 – 2022-12-18 (×2): 175 ug via ORAL
  Filled 2022-12-16 (×2): qty 1

## 2022-12-16 MED ORDER — RENA-VITE PO TABS
1.0000 | ORAL_TABLET | Freq: Every day | ORAL | Status: DC
  Administered 2022-12-16 – 2022-12-17 (×2): 1 via ORAL
  Filled 2022-12-16 (×3): qty 1

## 2022-12-16 MED ORDER — AMLODIPINE BESYLATE 10 MG PO TABS
10.0000 mg | ORAL_TABLET | Freq: Every day | ORAL | Status: DC
  Administered 2022-12-16 – 2022-12-18 (×2): 10 mg via ORAL
  Filled 2022-12-16 (×2): qty 1
  Filled 2022-12-16: qty 2

## 2022-12-16 MED ORDER — HYDRALAZINE HCL 50 MG PO TABS
100.0000 mg | ORAL_TABLET | Freq: Three times a day (TID) | ORAL | Status: DC
  Administered 2022-12-16 – 2022-12-18 (×5): 100 mg via ORAL
  Filled 2022-12-16 (×2): qty 2
  Filled 2022-12-16: qty 4
  Filled 2022-12-16 (×2): qty 2

## 2022-12-16 MED ORDER — ESCITALOPRAM OXALATE 10 MG PO TABS
20.0000 mg | ORAL_TABLET | Freq: Every day | ORAL | Status: DC
  Administered 2022-12-17 – 2022-12-18 (×2): 20 mg via ORAL
  Filled 2022-12-16 (×2): qty 2

## 2022-12-16 MED ORDER — DEXTROSE 5 % IV SOLN: INTRAVENOUS | Status: DC | PRN

## 2022-12-16 MED ORDER — LABETALOL HCL 5 MG/ML IV SOLN
5.0000 mg | Freq: Once | INTRAVENOUS | Status: AC
  Administered 2022-12-16: 5 mg via INTRAVENOUS
  Filled 2022-12-16: qty 4

## 2022-12-16 MED ORDER — CHLORHEXIDINE GLUCONATE CLOTH 2 % EX PADS
6.0000 | MEDICATED_PAD | Freq: Every day | CUTANEOUS | Status: DC
  Administered 2022-12-17 – 2022-12-18 (×2): 6 via TOPICAL

## 2022-12-16 MED ORDER — DEXTROSE 50 % IV SOLN: 0.0000 mL | INTRAVENOUS | Status: DC | PRN

## 2022-12-16 MED ORDER — DILTIAZEM HCL ER COATED BEADS 240 MG PO CP24
240.0000 mg | ORAL_CAPSULE | Freq: Every day | ORAL | Status: DC
  Administered 2022-12-16 – 2022-12-18 (×3): 240 mg via ORAL
  Filled 2022-12-16 (×3): qty 1

## 2022-12-16 MED ORDER — POTASSIUM CHLORIDE 10 MEQ/100ML IV SOLN
10.0000 meq | INTRAVENOUS | Status: AC
  Administered 2022-12-16 (×2): 10 meq via INTRAVENOUS
  Filled 2022-12-16 (×2): qty 100

## 2022-12-16 MED ORDER — LEVETIRACETAM 500 MG PO TABS
500.0000 mg | ORAL_TABLET | Freq: Every day | ORAL | Status: DC
  Administered 2022-12-17: 500 mg via ORAL
  Filled 2022-12-16 (×2): qty 1

## 2022-12-16 MED ORDER — PANTOPRAZOLE SODIUM 40 MG PO TBEC
40.0000 mg | DELAYED_RELEASE_TABLET | Freq: Every day | ORAL | Status: DC
  Administered 2022-12-17 – 2022-12-18 (×2): 40 mg via ORAL
  Filled 2022-12-16 (×2): qty 1

## 2022-12-16 MED ORDER — LOSARTAN POTASSIUM 50 MG PO TABS
75.0000 mg | ORAL_TABLET | Freq: Every day | ORAL | Status: DC
  Administered 2022-12-16 – 2022-12-18 (×2): 75 mg via ORAL
  Filled 2022-12-16: qty 2
  Filled 2022-12-16 (×2): qty 1

## 2022-12-16 MED ORDER — INSULIN REGULAR(HUMAN) IN NACL 100-0.9 UT/100ML-% IV SOLN
INTRAVENOUS | Status: DC
  Administered 2022-12-16: 5 [IU]/h via INTRAVENOUS
  Filled 2022-12-16: qty 100

## 2022-12-16 MED ORDER — ALBUTEROL SULFATE (2.5 MG/3ML) 0.083% IN NEBU: 2.5000 mg | INHALATION_SOLUTION | Freq: Four times a day (QID) | RESPIRATORY_TRACT | Status: DC | PRN

## 2022-12-16 MED ORDER — DILTIAZEM HCL ER 90 MG PO CP12: 240.0000 mg | ORAL_CAPSULE | Freq: Every day | ORAL | Status: DC

## 2022-12-16 NOTE — ED Triage Notes (Signed)
Coming from dialysis center, staff called ems because pt had a seizure pre-treatment. During the past week pt had blood sugar readings "high" and bp above 200s'. First Bp with ems 725 systolic. Las bp 220/130 post administration of nitro. Pt had a full dialysis treatment yesterday and was schedule for another today.

## 2022-12-16 NOTE — ED Provider Notes (Signed)
[facility] EMERGENCY DEPARTMENT Provider Note   CSN: [id number] Arrival date & time: 12/16/22  1137     History  Chief Complaint  Patient presents with   Seizures    Angela [name] is a 36 y.o. female.  The history is provided by the patient and medical records. No language interpreter was used.  Seizures Seizure activity on arrival: no   Seizure type:  Grand mal Initial focality:  None Episode characteristics: abnormal movements and unresponsiveness   Postictal symptoms: somnolence   Return to baseline: yes   Severity:  Moderate Timing:  Once Progression:  Unchanged Recent head injury:  No recent head injuries PTA treatment:  None History of seizures: yes        Home Medications Prior to Admission medications   Medication Sig Start Date End Date Taking? Authorizing Provider  albuterol (PROVENTIL HFA) 108 (90 Base) MCG/ACT inhaler Inhale 2 puffs into the lungs every 6 (six) hours as needed for wheezing or shortness of breath. 08/19/21   [name], MD  amLODipine (NORVASC) 10 MG tablet Take 1 tablet (10 mg total) by mouth every morning. Needs to be seen prior to next refill request 08/24/22   [name], MD  benzonatate (TESSALON) 100 MG capsule Take 1 capsule (100 mg total) by mouth 2 (two) times daily as needed for cough. Patient not taking: Reported on 12/01/2021 08/19/21   [name], MD  busPIRone (BUSPAR) 15 MG tablet TAKE ONE TABLET BY MOUTH EVERYDAY AT BEDTIME 11/17/22   [name], MD  COMFORT EZ PEN NEEDLES 32G X 4 MM MISC USE TO INJECT insulin ONCE DAILY AS DIRECTED 09/23/22   [name], MD  Continuous Blood Gluc Sensor (DEXCOM G6 SENSOR) MISC APPLY 1 SENSOR EVERY 10 DAYS 07/15/21   [provider]  Continuous Blood Gluc Sensor (DEXCOM G6 SENSOR) MISC SMARTSIG:1 Topical Every 10 Days 10/11/21   [provider]  Continuous Blood Gluc Transmit (DEXCOM G6 TRANSMITTER) MISC See admin  instructions. 03/04/21   [provider]  diltiazem (CARDIZEM CD) 240 MG 24 hr capsule Take 240 mg by mouth daily. 08/27/21   [provider]  escitalopram (LEXAPRO) 20 MG tablet TAKE ONE TABLET BY MOUTH ONCE DAILY 11/17/22   [name], MD  furosemide (LASIX) 40 MG tablet Take 2 tablets (80 mg total) by mouth daily. 12/01/21   Janina Mayo, MD  gabapentin (NEURONTIN) 300 MG capsule TAKE TWO CAPSULES BY MOUTH EVERY MORNING and TAKE TWO CAPSULES BY MOUTH EVERYDAY AT BEDTIME 11/17/22   [name], MD  Galcanezumab-gnlm (EMGALITY) 120 MG/ML SOAJ Inject 120 mg into the skin every 30 (thirty) days. Patient not taking: Reported on 12/01/2021 01/05/21   [name], MD  hydrALAZINE (APRESOLINE) 50 MG tablet TAKE TWO TABLETS BY MOUTH EVERY MORNING and TAKE TWO TABLETS BY MOUTH AT NOON and TAKE TWO TABLETS BY MOUTH EVERYDAY AT BEDTIME 12/30/21   [name], MD  Insulin Syringe-Needle U-100 (BD INSULIN SYRINGE ULTRAFINE) 31G X 15/64" 1 ML MISC Used to give daily insulin injections. 04/24/18   [name], MD  ketoconazole (NIZORAL) 2 % shampoo Apply 1 application topically 2 (two) times a week. Wash off after each use Patient not taking: Reported on 01/06/2022 08/19/21   [name], MD  levETIRAcetam (KEPPRA) 250 MG tablet TAKE 1 TABLET EVERY MONDAY, WEDNESDAY, FRIDAY AT 6 PM. TAKE ALONG WITH 500 MG DAILY ON DAYS OF DIALYSIS, AFTER DIALYSIS 12/01/22  [name], MD  levETIRAcetam (KEPPRA) 500 MG tablet TAKE 1 TABLET(500 MG) BY MOUTH DAILY AS DIRECTED 03/16/22   [name], MD  levothyroxine (SYNTHROID) 175 MCG tablet Take 175 mcg by mouth every morning. 06/09/21   [provider]  losartan (COZAAR) 50 MG tablet Take 1.5 tablets (75 mg total) by mouth daily. 12/01/21   Janina Mayo, MD  multivitamin (RENA-VIT) TABS tablet Take 1 tablet by mouth at bedtime. 10/01/21   [provider]  NOVOLOG 100 UNIT/ML injection USE AS DIRECTED  in insulin pump 11/01/21   [name], MD  ondansetron (ZOFRAN-ODT) 4 MG disintegrating tablet 4mg  ODT q4 hours prn nausea/vomit 11/18/22   Pollina, Gwenyth Allegra, MD  pantoprazole (PROTONIX) 40 MG tablet Take 1 tablet (40 mg total) by mouth daily. 12/10/21   [name], MD  pravastatin (PRAVACHOL) 20 MG tablet TAKE ONE TABLET BY MOUTH EVERYDAY AT BEDTIME 12/16/22   [name], MD  Rimegepant Sulfate (NURTEC) 75 MG TBDP Take 75 mg by mouth daily as needed. For migraines. Take as close to onset of migraine as possible. One daily maximum. Patient not taking: Reported on 12/01/2021 01/05/21   [name], MD  TRESIBA FLEXTOUCH 100 UNIT/ML FlexTouch Pen Inject 12 Units into the skin daily as needed (high blood sugar). 05/31/21   [provider]  metoCLOPramide (REGLAN) 5 MG tablet Take 1 tablet (5 mg total) by mouth 3 (three) times daily as needed for nausea or vomiting. Patient not taking: Reported on 06/25/2020 02/28/19 06/25/20  [name], MD      Allergies    Ferumoxytol, Lisinopril, Sulfamethoxazole, and Trimethoprim    Review of Systems   Review of Systems  Constitutional:  Positive for fatigue. Negative for chills and diaphoresis.  HENT:  Negative for congestion.   Respiratory:  Positive for chest tightness and shortness of breath. Negative for cough and wheezing.   Cardiovascular:  Positive for chest pain. Negative for palpitations and leg swelling.  Gastrointestinal:  Positive for abdominal pain, nausea and vomiting. Negative for constipation and diarrhea.  Genitourinary:  Negative for dysuria.  Musculoskeletal:  Negative for back pain, neck pain and neck stiffness.  Skin:  Negative for rash and wound.  Neurological:  Positive for seizures.  Psychiatric/Behavioral:  Negative for agitation and confusion.   All other systems reviewed and are negative.   Physical Exam Updated Vital Signs BP (!) 210/105 (BP Location: Right Arm)   Pulse 95    Temp 98.1 F (36.7 C) (Oral)   Resp 16   Ht 5\' 5"  (1.651 m)   Wt 58 kg Comment: dry weight  SpO2 100%   BMI 21.28 kg/m  Physical Exam Vitals and nursing note reviewed.  Constitutional:      General: She is not in acute distress.    Appearance: She is well-developed. She is ill-appearing. She is not toxic-appearing or diaphoretic.  HENT:     Head: Normocephalic and atraumatic.     Nose: No congestion or rhinorrhea.     Mouth/Throat:     Mouth: Mucous membranes are moist.     Pharynx: No oropharyngeal exudate or posterior oropharyngeal erythema.  Eyes:     Extraocular Movements: Extraocular movements intact.     Conjunctiva/sclera: Conjunctivae normal.     Pupils: Pupils are equal, round, and reactive to light.  Cardiovascular:     Rate and Rhythm: Regular rhythm. Tachycardia present.     Heart sounds: No murmur heard. Pulmonary:  Effort: Pulmonary effort is normal. No respiratory distress.     Breath sounds: Rales present. No wheezing or rhonchi.  Chest:     Chest wall: Tenderness present.  Abdominal:     General: Abdomen is flat.     Palpations: Abdomen is soft.     Tenderness: There is abdominal tenderness. There is no guarding or rebound.  Musculoskeletal:        General: Tenderness present. No swelling.     Cervical back: Neck supple. No tenderness.     Right lower leg: No edema.     Left lower leg: No edema.  Skin:    General: Skin is warm and dry.     Capillary Refill: Capillary refill takes less than 2 seconds.     Findings: No erythema or rash.  Neurological:     General: No focal deficit present.     Mental Status: She is alert.     ED Results / Procedures / Treatments   Labs (all labs ordered are listed, but only abnormal results are displayed) Labs Reviewed  BASIC METABOLIC PANEL - Abnormal; Notable for the following components:      Result Value   Sodium 125 (*)    Chloride 89 (*)    CO2 20 (*)    Glucose, Bld 942 (*)    BUN 27 (*)     Creatinine, Ser 4.89 (*)    GFR, Estimated 11 (*)    Anion gap 16 (*)    All other components within normal limits  CBC - Abnormal; Notable for the following components:   RBC 3.05 (*)    Hemoglobin 8.7 (*)    HCT 28.6 (*)    Platelets 92 (*)    All other components within normal limits  URINALYSIS, ROUTINE W REFLEX MICROSCOPIC - Abnormal; Notable for the following components:   Color, Urine STRAW (*)    Glucose, UA >=500 (*)    Protein, ur >=300 (*)    Bacteria, UA RARE (*)    All other components within normal limits  CBG MONITORING, ED - Abnormal; Notable for the following components:   Glucose-Capillary >600 (*)    All other components within normal limits  I-STAT VENOUS BLOOD GAS, ED - Abnormal; Notable for the following components:   pCO2, Ven 42.3 (*)    pO2, Ven 124 (*)    TCO2 21 (*)    Acid-base deficit 6.0 (*)    Sodium 123 (*)    HCT 28.0 (*)    Hemoglobin 9.5 (*)    All other components within normal limits  I-STAT CHEM 8, ED - Abnormal; Notable for the following components:   Sodium 124 (*)    Chloride 90 (*)    BUN 27 (*)    Creatinine, Ser 5.10 (*)    Glucose, Bld >700 (*)    TCO2 21 (*)    Hemoglobin 9.5 (*)    HCT 28.0 (*)    All other components within normal limits  LEVETIRACETAM LEVEL  BETA-HYDROXYBUTYRIC ACID  LIPASE, BLOOD  HEPATIC FUNCTION PANEL  CBC WITH DIFFERENTIAL/PLATELET  CBG MONITORING, ED  I-STAT BETA HCG BLOOD, ED ([facility], [facility], [facility] ONLY)  I-STAT BETA HCG BLOOD, ED ([facility], [facility], [facility] ONLY)  TROPONIN I (HIGH SENSITIVITY)    EKG EKG Interpretation  Date/Time:  Friday December 16 2022 11:46:30 EST Ventricular Rate:  94 PR Interval:  168 QRS Duration: 89 QT Interval:  372 QTC Calculation: 466 R Axis:   -71 Text Interpretation:  Sinus rhythm Left anterior fascicular block Probable anterior infarct, old Abnormal T, consider ischemia, lateral leads ST elevation, consider inferior injury when compared to prior, similar appearnce with taller T  waves. No STEMI Confirmed by Antony Blackbird [phone]) on 12/16/2022 1:15:00 PM  Radiology DG Chest Portable 1 View  Result Date: 12/16/2022 CLINICAL DATA:  Hyperglycemia EXAM: PORTABLE CHEST 1 VIEW COMPARISON:  11/17/2022 FINDINGS: Transverse diameter of heart is increased. Central pulmonary vessels are more prominent. There is slight prominence of interstitial markings in parahilar regions and lower lung fields. There is no focal consolidation. There is no pleural effusion or pneumothorax. Old healed fractures are seen in right fifth and sixth ribs. IMPRESSION: Cardiomegaly. Central pulmonary vessels are more prominent suggesting CHF. Increased interstitial markings are seen in parahilar regions and lower lung fields suggesting mild interstitial pulmonary edema. Electronically Signed   By: Elmer Picker M.D.   On: 12/16/2022 14:17   CT SOFT TISSUE NECK WO CONTRAST  Result Date: 12/16/2022 CLINICAL DATA:  36 year old female with left supraclavicular lymphadenopathy seen on outside thyroid ultrasound. Dialysis patient. EXAM: CT NECK WITHOUT CONTRAST TECHNIQUE: Multidetector CT imaging of the neck was performed following the standard protocol without intravenous contrast. RADIATION DOSE REDUCTION: This exam was performed according to the departmental dose-optimization program which includes automated exposure control, adjustment of the mA and/or kV according to patient size and/or use of iterative reconstruction technique. COMPARISON:  Cervical spine CT 10/15/2021. FINDINGS: Pharynx and larynx: Noncontrast larynx and pharynx appear within normal limits. Parapharyngeal and retropharyngeal spaces are within normal limits. Salivary glands: Negative noncontrast sublingual space, submandibular and parotid glands. Thyroid: Indistinct thyroid enlargement on this noncontrast exam, although appears similar to cervical spine CT last year (series 3, image 81). This has been evaluated on previous imaging. (ref: J  Am Coll Radiol. 2015 Feb;12(2): 143-50). Lymph nodes: Suboptimal evaluation of cervical lymph nodes in the absence of IV contrast. Appearance of asymmetric subcentimeter lymph nodes at the left level 4 and supraclavicular stations (such as on coronal images 58-60. And compared to the cervical spine CT last year there is probably been no significant change (series 3, image 83 today versus series 5, image 68 at that time). No level 1, level 2, or level 3 lymphadenopathy is evident. No level 5 lymphadenopathy. Vascular: Vascular patency is not evaluated in the absence of IV contrast. Some calcified external carotid artery branch atherosclerosis in the face. Calcified atherosclerosis at the skull base. Limited intracranial: Negative. Visualized orbits: Negative. Mastoids and visualized paranasal sinuses: Chronic right maxillary sinus mucosal thickening or mucous retention cyst. Other Visualized paranasal sinuses and mastoids are clear. Skeleton: No acute or suspicious osseous lesion identified. Upper chest: No significant mass effect on the trachea at the thoracic inlet. No evidence of superior mediastinal lymphadenopathy. Visible bilateral axillary lymph nodes are similar to those at the left level 4 station. Visible upper lungs are clear. IMPRESSION: 1. Indistinct and suspicious thyroid enlargement - refer to recent outside Thyroid Ultrasound for definitive characterization. 2. Suboptimal evaluation of cervical lymph nodes in the absence of IV contrast. Asymmetric but mostly subcentimeter lymph nodes at the left lower neck, level 4. Probably no significantly changed from Cervical Spine CT in October 2022. This is near the expected location of the thoracic duct, and with no other cervical lymphadenopathy benign etiology is favored. Recommend follow-up by clinical exam, with repeat Neck CT (IV contrast preferred) if any area enlarges or becomes painful. Electronically Signed   By: [name] M.D.   On:  12/16/2022 09:59     Procedures Procedures    CRITICAL CARE Performed by: Gwenyth Allegra Nikitas Davtyan Total critical care time: 35 minutes Critical care time was exclusive of separately billable procedures and treating other patients. Critical care was necessary to treat or prevent imminent or life-threatening deterioration. Critical care was time spent personally by me on the following activities: development of treatment plan with patient and/or surrogate as well as nursing, discussions with consultants, evaluation of patient's response to treatment, examination of patient, obtaining history from patient or surrogate, ordering and performing treatments and interventions, ordering and review of laboratory studies, ordering and review of radiographic studies, pulse oximetry and re-evaluation of patient's condition.   Medications Ordered in ED Medications  lactated ringers bolus 1,160 mL (has no administration in time range)  insulin regular, human (MYXREDLIN) 100 units/ 100 mL infusion (5 Units/hr Intravenous New Bag/Given 12/16/22 1413)  lactated ringers infusion ( Intravenous New Bag/Given 12/16/22 1404)  dextrose 5 % in lactated ringers infusion (has no administration in time range)  dextrose 50 % solution 0-50 mL (has no administration in time range)  potassium chloride 10 mEq in 100 mL IVPB (10 mEq Intravenous New Bag/Given 12/16/22 1406)    ED Course/ Medical Decision Making/ A&P                           Medical Decision Making Amount and/or Complexity of Data Reviewed Labs: ordered. Radiology: ordered.  Risk Prescription drug management. Decision regarding hospitalization.    Angela [name] is a 36 y.o. female with a past medical history significant for hypothyroidism, hypertension, seizures, diabetes, ESRD on dialysis Monday Wednesday Friday, depression, previous gastritis, asthma, and GERD who presents with seizure hyperglycemia.  According to patient, she is felt bad for the last few days and  has had almost daily seizures.  She says that she gets dialysis MWF but yesterday had a full dialysis treatment because of fluid overload.  She was was to go today but then had a seizure right before treatment so they sent her here.  They found her blood pressure to be very high and her blood pressure was over 518 systolic.  On arrival, patient is complaining of chest tightness, shortness of breath, and abdominal pain that feel consistent with when she has been fluid overloaded.  She also reports headaches that she gets when her blood pressure is very high.  She reports some nausea and vomiting and fatigue.  She denies fevers chills or cough.  She reports that she does still make some urine it does not seem different to her.  On exam, lungs do have some rales in the bases.  No significant rhonchi.  Chest and abdomen were tender to palpation.  EKG did not show STEMI.  Patient lab workup to look for DKA given hyperglycemia as well as other causes of her hyperglycemia.  Patient has screening labs and workup to do show early DKA with anion gap of 16 and a glucose of 942.  Potassium was 4.6.  Her pH was 7.2 so I do think she has a degree of acidosis and is still waiting on urinalysis to see ketones.  Beta hydroxy uric acid was ordered.  She reports has been taking her seizure medication.  The seizure does not seems concerning to the family but the overall fatigue, elevated blood pressures, symptoms that she is fluid overloaded are more concern to them.  Clinically I do think she is going to early  DKA and is having some symptoms of elevated blood pressures.  I spoke with Dr. Melvia Heaps with nephrology who does think she needs dialysis and admission to the hospital to help take care of her DKA.  Will call medicine for admission.  She is getting chest x-ray and CT head given the chest pain and headache however have low suspicion that will show large infection or acute intracranial hemorrhage given her lack of any  focal neurologic deficits on my exam.  Will call for admission for further management.         Final Clinical Impression(s) / ED Diagnoses Final diagnoses:  Diabetic ketoacidosis without coma associated with type 1 diabetes mellitus ([place])  Seizure ([place])  Other chest pain  Shortness of breath  Hypervolemia, unspecified hypervolemia type     Clinical Impression: 1. Diabetic ketoacidosis without coma associated with type 1 diabetes mellitus ([facility])   2. Seizure ([place])   3. Other chest pain   4. Shortness of breath   5. Hypervolemia, unspecified hypervolemia type     Disposition: Admit  This note was prepared with assistance of Dragon voice recognition software. Occasional wrong-word or sound-a-like substitutions may have occurred due to the inherent limitations of voice recognition software.     Jahmya Onofrio, Gwenyth Allegra, MD 12/16/22 1515

## 2022-12-16 NOTE — Telephone Encounter (Signed)
Phone call placed to patient today to go over the results of the CT scan of the neck.  I left a voicemail message informing of who I am and my reason for calling.  I told her that I will send a message to her MyChart account.  In looking through her chart I see that she is in the emergency room with history of having had a seizure.

## 2022-12-16 NOTE — ED Notes (Signed)
Fluid bolus stopped at this time following Dr. Doreene Burke orders with chest Xray readings.
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
## 2022-12-17 DIAGNOSIS — R569 Unspecified convulsions: Secondary | ICD-10-CM | POA: Diagnosis not present

## 2022-12-17 LAB — BASIC METABOLIC PANEL
Anion gap: 15 (ref 5–15)
Anion gap: 15 (ref 5–15)
Anion gap: 14 (ref 5–15)
BUN: 29 mg/dL — ABNORMAL HIGH (ref 6–20)
BUN: 29 mg/dL — ABNORMAL HIGH (ref 6–20)
BUN: 30 mg/dL — ABNORMAL HIGH (ref 6–20)
CO2: 22 mmol/L (ref 22–32)
CO2: 21 mmol/L — ABNORMAL LOW (ref 22–32)
CO2: 21 mmol/L — ABNORMAL LOW (ref 22–32)
Calcium: 9.6 mg/dL (ref 8.9–10.3)
Calcium: 9.6 mg/dL (ref 8.9–10.3)
Calcium: 9.1 mg/dL (ref 8.9–10.3)
Chloride: 96 mmol/L — ABNORMAL LOW (ref 98–111)
Chloride: 96 mmol/L — ABNORMAL LOW (ref 98–111)
Chloride: 95 mmol/L — ABNORMAL LOW (ref 98–111)
Creatinine, Ser: 5.37 mg/dL — ABNORMAL HIGH (ref 0.44–1.00)
Creatinine, Ser: 5.54 mg/dL — ABNORMAL HIGH (ref 0.44–1.00)
Creatinine, Ser: 5.83 mg/dL — ABNORMAL HIGH (ref 0.44–1.00)
GFR, Estimated: 10 mL/min — ABNORMAL LOW (ref >60)
GFR, Estimated: 10 mL/min — ABNORMAL LOW (ref >60)
GFR, Estimated: 9 mL/min — ABNORMAL LOW (ref >60)
Glucose, Bld: 132 mg/dL — ABNORMAL HIGH (ref 70–99)
Glucose, Bld: 136 mg/dL — ABNORMAL HIGH (ref 70–99)
Glucose, Bld: 158 mg/dL — ABNORMAL HIGH (ref 70–99)
Potassium: 3.8 mmol/L (ref 3.5–5.1)
Potassium: 3.9 mmol/L (ref 3.5–5.1)
Potassium: 3.9 mmol/L (ref 3.5–5.1)
Sodium: 133 mmol/L — ABNORMAL LOW (ref 135–145)
Sodium: 132 mmol/L — ABNORMAL LOW (ref 135–145)
Sodium: 130 mmol/L — ABNORMAL LOW (ref 135–145)

## 2022-12-17 LAB — GLUCOSE, CAPILLARY
Glucose-Capillary: 106 mg/dL — ABNORMAL HIGH (ref 70–99)
Glucose-Capillary: 129 mg/dL — ABNORMAL HIGH (ref 70–99)
Glucose-Capillary: 144 mg/dL — ABNORMAL HIGH (ref 70–99)
Glucose-Capillary: 207 mg/dL — ABNORMAL HIGH (ref 70–99)
Glucose-Capillary: 249 mg/dL — ABNORMAL HIGH (ref 70–99)
Glucose-Capillary: 264 mg/dL — ABNORMAL HIGH (ref 70–99)

## 2022-12-17 LAB — CBC
HCT: 21 % — ABNORMAL LOW (ref 36.0–46.0)
Hemoglobin: 7 g/dL — ABNORMAL LOW (ref 12.0–15.0)
MCH: 29.2 pg (ref 26.0–34.0)
MCHC: 33.3 g/dL (ref 30.0–36.0)
MCV: 87.5 fL (ref 80.0–100.0)
Platelets: 108 10*3/uL — ABNORMAL LOW (ref 150–400)
RBC: 2.4 MIL/uL — ABNORMAL LOW (ref 3.87–5.11)
RDW: 13.1 % (ref 11.5–15.5)
WBC: 7.7 10*3/uL (ref 4.0–10.5)
nRBC: 0 % (ref 0.0–0.2)

## 2022-12-17 LAB — HEPATITIS B SURFACE ANTIBODY, QUANTITATIVE: Hep B S AB Quant (Post): 49.2 m[IU]/mL (ref >9.9)

## 2022-12-17 LAB — HEMOGLOBIN A1C
Hgb A1c MFr Bld: 10.9 % — ABNORMAL HIGH (ref 4.8–5.6)
Mean Plasma Glucose: 266.13 mg/dL

## 2022-12-17 LAB — BETA-HYDROXYBUTYRIC ACID
Beta-Hydroxybutyric Acid: 0.18 mmol/L (ref 0.05–0.27)
Beta-Hydroxybutyric Acid: 0.18 mmol/L (ref 0.05–0.27)

## 2022-12-17 LAB — OSMOLALITY: Osmolality: 297 mOsm/kg — ABNORMAL HIGH (ref 275–295)

## 2022-12-17 LAB — HEPATITIS B SURFACE ANTIGEN: Hepatitis B Surface Ag: NONREACTIVE

## 2022-12-17 MED ORDER — HEPARIN SODIUM (PORCINE) 1000 UNIT/ML IJ SOLN
INTRAMUSCULAR | Status: AC
  Administered 2022-12-17: 2000 [IU]
  Filled 2022-12-17: qty 2

## 2022-12-17 MED ORDER — DEXTROSE 5 % IV SOLN: INTRAVENOUS | Status: AC | PRN

## 2022-12-17 MED ORDER — INSULIN ASPART 100 UNIT/ML IJ SOLN
0.0000 [IU] | Freq: Every day | INTRAMUSCULAR | Status: DC
  Administered 2022-12-17: 3 [IU] via SUBCUTANEOUS

## 2022-12-17 MED ORDER — OXYCODONE HCL 5 MG PO TABS: 5.0000 mg | ORAL_TABLET | Freq: Four times a day (QID) | ORAL | Status: DC | PRN

## 2022-12-17 MED ORDER — INSULIN ASPART 100 UNIT/ML IJ SOLN
0.0000 [IU] | Freq: Three times a day (TID) | INTRAMUSCULAR | Status: DC
  Administered 2022-12-17 (×2): 3 [IU] via SUBCUTANEOUS

## 2022-12-17 MED ORDER — FUROSEMIDE 40 MG PO TABS
80.0000 mg | ORAL_TABLET | Freq: Every day | ORAL | Status: DC
  Filled 2022-12-17 (×2): qty 2

## 2022-12-17 MED ORDER — ACETAMINOPHEN 325 MG PO TABS
650.0000 mg | ORAL_TABLET | Freq: Four times a day (QID) | ORAL | Status: DC | PRN
  Administered 2022-12-17: 650 mg via ORAL
  Filled 2022-12-17: qty 2

## 2022-12-17 MED ORDER — INSULIN REGULAR(HUMAN) IN NACL 100-0.9 UT/100ML-% IV SOLN: INTRAVENOUS | Status: AC

## 2022-12-17 MED ORDER — INSULIN ASPART 100 UNIT/ML IJ SOLN
2.0000 [IU] | Freq: Three times a day (TID) | INTRAMUSCULAR | Status: DC
  Administered 2022-12-17: 2 [IU] via SUBCUTANEOUS

## 2022-12-17 MED ORDER — INSULIN GLARGINE-YFGN 100 UNIT/ML ~~LOC~~ SOLN
5.0000 [IU] | Freq: Every day | SUBCUTANEOUS | Status: DC
  Administered 2022-12-17 (×2): 5 [IU] via SUBCUTANEOUS
  Filled 2022-12-17 (×3): qty 0.05

## 2022-12-17 MED ORDER — HYDROXYZINE HCL 10 MG PO TABS
10.0000 mg | ORAL_TABLET | Freq: Three times a day (TID) | ORAL | Status: DC | PRN
  Administered 2022-12-17 – 2022-12-18 (×4): 10 mg via ORAL
  Filled 2022-12-17 (×5): qty 1

## 2022-12-17 NOTE — Assessment & Plan Note (Signed)
Due to hypertension hyperglycemia, possibly related to misadventure with Keppra dosing

## 2022-12-17 NOTE — Assessment & Plan Note (Signed)
No vomiting

## 2022-12-17 NOTE — Assessment & Plan Note (Signed)
Blood pressure normalized - Continue amlodipine, diltiazem, Lasix, hydralazine, losartan

## 2022-12-17 NOTE — Assessment & Plan Note (Signed)
Resolved with insulin drip overnight - Continue Semglee - Continue sliding scale corrections -Add 2 units with meals - Continue pravastatin

## 2022-12-17 NOTE — Assessment & Plan Note (Signed)
Continue gabapentin.

## 2022-12-17 NOTE — Progress Notes (Signed)
[facility] Progress Note  Subjective: seen in room, wants to know when she can go home  Vitals:   12/17/22 0004 12/17/22 0324 12/17/22 0825 12/17/22 1118  BP: (!) 169/88 136/64 130/68 (!) 155/86  Pulse: 91 95 92 96  Resp: 12 15 16 20   Temp: 98.1 F (36.7 C) 98.6 F (37 C) 98.5 F (36.9 C) 98.2 F (36.8 C)  TempSrc: Oral Oral Oral Oral  SpO2: 98% 96% 98% 99%  Weight: 63.1 kg     Height:         Exam Gen alert, no distress, pleasant  No jvd or bruits Chest clear bilat to bases RRR no MRG Abd soft ntnd no mass or ascites +bs MS no joint effusions or deformity Ext no LE edema Neuro is alert, Ox 3 , nf    LUA AVF+bruit    Home meds include - norvasc 10, buspar, cardizem 240 , lexapro, lasix 80 qd, neurontin 300mg  2 am and 2 pm, emgality, hydralazine 100 tid, keppra, synthroid, losartan 75 qd, pravachol, rimegepant, tresiba insulin, prns/ vits/ supps        OP HD: MWF [facility] [address] 2nd shift 3.5h  57kg  left 59kg yest  AVF LUA  2/2.5       Assessment/ Plan: Seizure - hx of seizures on Keppra. Per pmd.  Uncont DMT1 - BS 900s, started on insulin drip here. We cancelled all the LR/ NS drips in the IV insulin protocol and added prn D5W when BS < 250 because diabetic ESRD pt's don't need saline volume expansion for treatment of HHS/ DKA because they do not get dehydrated from osmotic diuresis because their kidneys don't work.  Uncont HTN /vol - BPs 230/ 110 in ED. Much better today, back on home meds. +3-4 kg by wts. Max UF w/ HD today/ tonight.  ESRD - on HD MWF.  Had HD this week on mon-wed-Thursday. Short HD today. She is for dc home after HD today. She has holiday schedule dialysis tomorrow on Sunday 2nd shift (Sun / Wed/ Friday this wk).  Anemia esrd - Hb 9s here, follow MBD ckd - Ca and phos in range. Cont home meds.  Dispo - for dc today after dialysis       Rob Lyndol [place] 12/17/2022, 2:30 PM   Recent Labs  Lab 12/16/22 1243 12/16/22 1412  12/16/22 1800 12/16/22 2028 12/17/22 0459 12/17/22 0823  HGB 9.5*  --  7.3*  --   --   --   ALBUMIN  --  3.6 3.2*  --   --   --   CALCIUM  --   --   --    < > 9.6 9.1  PHOS  --   --  3.9  --   --   --   CREATININE  --   --   --    < > 5.54* 5.83*  K 4.6  --   --    < > 3.9 3.9   < > = values in this interval not displayed.   No results for input(s): "IRON", "TIBC", "FERRITIN" in the last 168 hours. Inpatient medications:  amLODipine  10 mg Oral Daily   busPIRone  15 mg Oral QHS   Chlorhexidine Gluconate Cloth  6 each Topical Q0600   diltiazem  240 mg Oral Daily   escitalopram  20 mg Oral Daily   furosemide  80 mg Oral Daily   gabapentin  300 mg Oral BID   heparin  5,000 Units Subcutaneous Q8H   hydrALAZINE  100 mg Oral Q8H   insulin aspart  0-5 Units Subcutaneous QHS   insulin aspart  0-9 Units Subcutaneous TID WC   insulin aspart  2 Units Subcutaneous TID WC   insulin glargine-yfgn  5 Units Subcutaneous QHS   levETIRAcetam  250 mg Oral Q M,W,F   levETIRAcetam  500 mg Oral Q1200   levothyroxine  175 mcg Oral q morning   losartan  75 mg Oral Daily   multivitamin  1 tablet Oral QHS   pantoprazole  40 mg Oral Daily   pravastatin  20 mg Oral QHS    acetaminophen, albuterol, dextrose, hydrOXYzine, oxyCODONE

## 2022-12-17 NOTE — Assessment & Plan Note (Signed)
Continue levothyroxine 

## 2022-12-17 NOTE — Inpatient Diabetes Management (Addendum)
Inpatient Diabetes Program Recommendations  AACE/ADA: New Consensus Statement on Inpatient Glycemic Control (2015)  Target Ranges:  Prepandial:   less than 140 mg/dL      Peak postprandial:   less than 180 mg/dL (1-2 hours)      Critically ill patients:  140 - 180 mg/dL   Lab Results  Component Value Date   GLUCAP 106 (H) 12/17/2022   HGBA1C 10.9 (H) 12/17/2022    Review of Glycemic Control  Latest Reference Range & Units 12/17/22 00:15 12/17/22 01:27 12/17/22 06:12  Glucose-Capillary 70 - 99 mg/dL 129 (H) 144 (H) 106 (H)  (H): Data is abnormally high Diabetes history: Type 1 Dm Outpatient Diabetes medications: Tresiba 12 units QD, Novolog 6 units TID Current orders for Inpatient glycemic control: IV insulin transitioned to Semglee 5 units QHS, Novolog 0-9 units TID & HS  Followed by Gery Pray, NP outpatient endocrinology  Inpatient Diabetes Program Recommendations:    Consider adding Novolog 2 units TID (assuming patient is consuming >50% of meals) Attempted to speak with patient; no answer. Reached out to RN via secure chat to help assist and ensure patient appropriate.   Addendum: Spoke with patient regarding outpatient diabetes management.  Patient is scheduled with pump trainer for Tslim mid January. Admits to overindulging in CHO and forgot to cover with insulin. Reviewed patient's current A1c of 10.9%. Explained what a A1c is and what it measures. Also reviewed goal A1c with patient, importance of good glucose control @ home, and blood sugar goals. Reviewed DKA, need for covering CHO, survival skills, when to call MD, control IQ with Tslim benefits, vascular changes and commorbidities. Patient has supplies and uses Dexcom. No further needs at this time.  Thanks, Bronson Curb, MSN, RNC-OB Diabetes Coordinator [phone] (8a-5p)

## 2022-12-17 NOTE — Progress Notes (Signed)
  Progress Note   Patient: Angela [name] OQH:[id number] DOB: 10/24/1986 DOA: 12/16/2022     1 DOS: the patient was seen and examined on 12/17/2022       Brief hospital course: 36 y.o. female with medical history significant of type 1 diabetes, depression, hyperlipidemia, hypothyroidism, hypertension, GERD, ESRD on HD MWF, neuropathy, gastroparesis, neurogenic bladder, migraine, asthma, seizure, anxiety, anemia presenting with seizure dialysis facility, BP 546 systolic on admission, glucose >900.    12/29: Admitted on insulin drip, Nephrology consulted     Assessment and Plan: * Seizure ([place]) Due to hypertension hyperglycemia, possibly related to misadventure with Keppra dosing  Hypertensive urgency Blood pressure normalized - Continue amlodipine, diltiazem, Lasix, hydralazine, losartan  DKA (diabetic ketoacidosis) ([place]) Resolved with insulin drip overnight - Continue Semglee - Continue sliding scale corrections -Add 2 units with meals - Continue pravastatin  ESRD on dialysis [facility]) - Consult nephrology for dialysis, she is edematous will need dialysis   Moderate major depression (HCC) - Continue BuSpar and Lexapro  Asthma No active disease - Continue as needed albuterol  Anxiety and depression - Continue BuSpar and Lexapro  Mixed hyperlipidemia - Continue pravastatin  Diabetic peripheral neuropathy associated with type 1 diabetes mellitus (HCC) Continue gabapentin  Anemia in chronic renal disease Hemoglobin low  Diabetic gastroparesis associated with type 1 diabetes mellitus (HCC) No vomiting  HTN (hypertension) See above  GERD (gastroesophageal reflux disease) - Continue PPI  Hypothyroidism - Continue levothyroxine          Subjective: Patient feels swollen, edematous     Physical Exam: BP (!) 133/48 (BP Location: Right Wrist)   Pulse 88   Temp 98 F (36.7 C) (Oral)   Resp 18   Ht 5\' 5"  (1.651 m)   Wt 63.1 kg   SpO2 100%   BMI  23.15 kg/m   Adult female, lying in bed, interactive and appropriate RRR, no murmurs, diffuse peripheral edema Abdomen soft no tenderness palpation or guarding, no ascites or distention  Data Reviewed: Discussed with nephrology Patient metabolic panel, glucoses, and CBC reviewed  Family Communication:     Disposition: Status is: Inpatient         Author: Edwin Dada, MD 12/17/2022 7:25 PM  For on call review [email or web address].

## 2022-12-17 NOTE — Assessment & Plan Note (Signed)
Continue BuSpar and Lexapro.

## 2022-12-17 NOTE — Hospital Course (Signed)
36 y.o. female with medical history significant of type 1 diabetes, depression, hyperlipidemia, hypothyroidism, hypertension, GERD, ESRD on HD MWF, neuropathy, gastroparesis, neurogenic bladder, migraine, asthma, seizure, anxiety, anemia presenting with seizure dialysis facility, BP 492 systolic on admission, glucose >900.    12/29: Admitted on insulin drip, Nephrology consulted

## 2022-12-17 NOTE — Assessment & Plan Note (Signed)
See above

## 2022-12-17 NOTE — Assessment & Plan Note (Signed)
Continue PPI ?

## 2022-12-17 NOTE — Assessment & Plan Note (Signed)
No active disease - Continue as needed albuterol

## 2022-12-17 NOTE — ED Notes (Signed)
Prior to transporting patient to the floor patient had a 20 second episode of seizure like activity. ER MD brought to bedside, gave verbal order to reevaluate patient CBG and not to administer medication. Patient A & O x 3 right after episode, there were no alterations in vital signs. Receiving RN notified of event and CBG 135.

## 2022-12-17 NOTE — Assessment & Plan Note (Signed)
-   Consult nephrology for dialysis, she is edematous will need dialysis

## 2022-12-17 NOTE — Assessment & Plan Note (Signed)
Continue pravastatin 

## 2022-12-17 NOTE — Assessment & Plan Note (Signed)
Hemoglobin low

## 2022-12-18 DIAGNOSIS — R569 Unspecified convulsions: Secondary | ICD-10-CM | POA: Diagnosis not present

## 2022-12-18 LAB — GLUCOSE, CAPILLARY: Glucose-Capillary: 72 mg/dL (ref 70–99)

## 2022-12-18 MED ORDER — LEVETIRACETAM 250 MG PO TABS: ORAL_TABLET | ORAL | 1 refills | Status: DC

## 2022-12-18 MED ORDER — LEVETIRACETAM 500 MG PO TABS: 500.0000 mg | ORAL_TABLET | Freq: Every day | ORAL | 2 refills | Status: DC

## 2022-12-18 NOTE — Progress Notes (Signed)
[facility] Progress Note  Subjective: seen in room. Pt had HD last night w/ 3.5 L net UF. Standing wt this am is 60.7kg.  Her next HD would be today (per holiday schedule) but this is not ideal because she is ready to dc and just had HD overnight. I don't think she needs another HD today and can go to Tuesday or Wed. She called her HD unit manager who gave her a slot on Tues am at 6 am so she doesn't have to wait until Wed for her next HD.   Vitals:   12/18/22 0000 12/18/22 0413 12/18/22 0759 12/18/22 1040  BP: (!) 143/73 (!) 151/84 (!) 179/121   Pulse: 92 88 86   Resp: 16 13 12    Temp:  98.2 F (36.8 C) 98.5 F (36.9 C)   TempSrc:  Oral Oral   SpO2: 98% 99% 95%   Weight:    60.7 kg  Height:         Exam Gen alert, no distress, pleasant  No jvd or bruits Chest clear bilat to bases RRR no MRG Abd soft ntnd no mass or ascites +bs MS no joint effusions or deformity Ext no LE edema Neuro is alert, Ox 3 , nf    LUA AVF+bruit    Home meds include - norvasc 10, buspar, cardizem 240 , lexapro, lasix 80 qd, neurontin 300mg  2 am and 2 pm, emgality, hydralazine 100 tid, keppra, synthroid, losartan 75 qd, pravachol, rimegepant, tresiba insulin, prns/ vits/ supps        OP HD: MWF [facility] [address] 2nd shift 3.5h  57kg  left 59kg yest  AVF LUA  2/2.5       Assessment/ Plan: Seizure - hx of seizures on Keppra. Per pmd.  Uncont DMT1 - BS 900s, sp IV insulin protocol. Per pmd.  Uncont HTN /vol - BPs 230/ 110 in ED, much better on her home meds.  ESRD - on HD MWF.  Had HD this week on mon-wed-Thursday. Had HD overnight last night. Today would be HD on holiday schedule but she doesn't want another HD today and I agree that she can go to her OP unit on Tues or Wed from here and it is okay to skip HD today and it is okay now for dc home from renal standpoint.  Anemia esrd - Hb 9s here, follow MBD ckd - Ca and phos in range. Cont home meds.    Rob Maurilio Puryear 12/18/2022, 11:31  AM   Recent Labs  Lab 12/16/22 1412 12/16/22 1800 12/16/22 2028 12/17/22 0459 12/17/22 0823 12/17/22 1817  HGB  --  7.3*  --   --   --  7.0*  ALBUMIN 3.6 3.2*  --   --   --   --   CALCIUM  --   --    < > 9.6 9.1  --   PHOS  --  3.9  --   --   --   --   CREATININE  --   --    < > 5.54* 5.83*  --   K  --   --    < > 3.9 3.9  --    < > = values in this interval not displayed.    No results for input(s): "IRON", "TIBC", "FERRITIN" in the last 168 hours. Inpatient medications:  amLODipine  10 mg Oral Daily   busPIRone  15 mg Oral QHS   Chlorhexidine Gluconate Cloth  6 each Topical Q0600  diltiazem  240 mg Oral Daily   escitalopram  20 mg Oral Daily   furosemide  80 mg Oral Daily   gabapentin  300 mg Oral BID   heparin  5,000 Units Subcutaneous Q8H   hydrALAZINE  100 mg Oral Q8H   insulin aspart  0-5 Units Subcutaneous QHS   insulin aspart  0-9 Units Subcutaneous TID WC   insulin aspart  2 Units Subcutaneous TID WC   insulin glargine-yfgn  5 Units Subcutaneous QHS   levETIRAcetam  250 mg Oral Q M,W,F   levETIRAcetam  500 mg Oral Q1200   levothyroxine  175 mcg Oral q morning   losartan  75 mg Oral Daily   multivitamin  1 tablet Oral QHS   pantoprazole  40 mg Oral Daily   pravastatin  20 mg Oral QHS    acetaminophen, albuterol, dextrose, hydrOXYzine, oxyCODONE

## 2022-12-18 NOTE — Discharge Summary (Signed)
Physician Discharge Summary   Patient: Angela [name] MRN: [id number] DOB: 1986-02-02  Admit date:     12/16/2022  Discharge date: 12/18/22  Discharge Physician: Edwin Dada   PCP: [name], MD     Recommendations at discharge:  Follow up with Neurology Dr. Vanessa [place] Dr. Vanessa [place]:  Patient had been inadvertently taking 250 mg Keppra daily and 750 mg Keppra on MWF after HD --> regimen of 500 mg daily and 250 on HD days was reviewed  Follow up with PCP Dr. Wynetta Emery in 1 week     Discharge Diagnoses: Principal Problem:   Seizure due to hyperglycemia and hypertension Active Problems:   Hypertensive urgency   DKA (diabetic ketoacidosis) ([place])   Hypothyroidism   GERD (gastroesophageal reflux disease)   HTN (hypertension)   Diabetic gastroparesis associated with type 1 diabetes mellitus (HCC)   Anemia in chronic renal disease   Diabetic peripheral neuropathy associated with type 1 diabetes mellitus (HCC)   Mixed hyperlipidemia   Anxiety and depression   Asthma   Moderate major depression ([place])   ESRD on dialysis [facility])     Hospital Course: Angela [name] is a 36 y.o. F with T1DM, ESRD on HD MWF, HTN, gastroparesis, neurogenic bladder, migraine, asthma, seizure, anxiety, anemia who presentd with seizure after dialysis at her facility.  In the ER, BP 858 systolic on admission, glucose >900.  Admitted on insulin drip, Nephrology consulted     * Seizure [facility]) Due to hypertension hyperglycemia, possibly related to misadventure with Keppra dosing.    Refills of Keppra sent, recommend follow up with Dr. Vanessa [place].    Hypertensive urgency Blood pressure medicines were resumed, she underwent HD and her BP normalized.   On amlodipine, diltiazem, Lasix, hydralazine, losartan  DKA (diabetic ketoacidosis) ([place]) Admitted with acidosis and hyperglycemia.  Treated with insulin drip overnight and resolved, resume subcutaneous insulin and glucoses relatively well  controlled.    ESRD on dialysis ([place])  Moderate major depression ([place]) Stable on BuSpar and Lexapro  Asthma No active disease  Mixed hyperlipidemia On pravastatin  Anemia in chronic renal disease  Hypothyroidism Stable on levothyroxine            The [facility] Controlled Substances Registry was reviewed for this patient prior to discharge.  Consultants: Nephrology Procedures performed: Routine HD  Disposition: Home Diet recommendation:  Discharge Diet Orders (From admission, onward)     Start     Ordered   12/18/22 0000  Diet - low sodium heart healthy        12/18/22 1159             DISCHARGE MEDICATION: Allergies as of 12/18/2022       Reactions   Ferumoxytol Itching   Lisinopril Other (See Comments)   hyperkalemia   Sulfamethoxazole Hives, Itching   Trimethoprim Hives        Medication List     TAKE these medications    albuterol 108 (90 Base) MCG/ACT inhaler Commonly known as: Proventil HFA Inhale 2 puffs into the lungs every 6 (six) hours as needed for wheezing or shortness of breath.   amLODipine 10 MG tablet Commonly known as: NORVASC Take 1 tablet (10 mg total) by mouth every morning. Needs to be seen prior to next refill request What changed:  when to take this additional instructions   busPIRone 15 MG tablet Commonly known as: BUSPAR TAKE ONE TABLET BY MOUTH EVERYDAY AT BEDTIME What changed: See the new instructions.   Comfort EZ  Pen Needles 32G X 4 MM Misc Generic drug: Insulin Pen Needle USE TO INJECT insulin ONCE DAILY AS DIRECTED   Dexcom G6 Sensor Misc APPLY 1 SENSOR EVERY 10 DAYS   Dexcom G6 Sensor Misc SMARTSIG:1 Topical Every 10 Days   Dexcom G6 Transmitter Misc See admin instructions.   diltiazem 240 MG 24 hr capsule Commonly known as: CARDIZEM CD Take 240 mg by mouth daily.   escitalopram 20 MG tablet Commonly known as: LEXAPRO TAKE ONE TABLET BY MOUTH ONCE DAILY   furosemide 40 MG  tablet Commonly known as: LASIX Take 2 tablets (80 mg total) by mouth daily.   gabapentin 300 MG capsule Commonly known as: NEURONTIN TAKE TWO CAPSULES BY MOUTH EVERY MORNING and TAKE TWO CAPSULES BY MOUTH EVERYDAY AT BEDTIME What changed: See the new instructions.   hydrALAZINE 100 MG tablet Commonly known as: APRESOLINE Take 100 mg by mouth 2 (two) times daily. What changed: Another medication with the same name was removed. Continue taking this medication, and follow the directions you see here.   Insulin Syringe-Needle U-100 31G X 15/64" 1 ML Misc Commonly known as: BD Insulin Syringe Ultrafine Used to give daily insulin injections.   levETIRAcetam 250 MG tablet Commonly known as: KEPPRA TAKE 1 TABLET EVERY MONDAY, WEDNESDAY, FRIDAY AFTER DIALYSIS. TAKE IN ADDITION TO 500 MG DAILY What changed: See the new instructions.   levETIRAcetam 500 MG tablet Commonly known as: KEPPRA Take 1 tablet (500 mg total) by mouth daily. What changed: See the new instructions.   levothyroxine 175 MCG tablet Commonly known as: SYNTHROID Take 175 mcg by mouth every morning.   losartan 50 MG tablet Commonly known as: COZAAR Take 1.5 tablets (75 mg total) by mouth daily.   multivitamin Tabs tablet Take 1 tablet by mouth at bedtime.   NovoLOG FlexPen 100 UNIT/ML FlexPen Generic drug: insulin aspart Inject 2-8 Units into the skin 3 (three) times daily with meals. Per sliding scale   NovoLOG 100 UNIT/ML injection Generic drug: insulin aspart USE AS DIRECTED in insulin pump   ondansetron 4 MG disintegrating tablet Commonly known as: ZOFRAN-ODT 4mg  ODT q4 hours prn nausea/vomit What changed:  how much to take how to take this when to take this reasons to take this additional instructions   pantoprazole 40 MG tablet Commonly known as: PROTONIX Take 1 tablet (40 mg total) by mouth daily.   pravastatin 20 MG tablet Commonly known as: PRAVACHOL TAKE ONE TABLET BY MOUTH EVERYDAY AT  BEDTIME   Tresiba FlexTouch 100 UNIT/ML FlexTouch Pen Generic drug: insulin degludec Inject 12 Units into the skin daily as needed (high blood sugar).         Discharge Instructions     Diet - low sodium heart healthy   Complete by: As directed    Discharge instructions   Complete by: As directed    **IMPORTANT DISCHARGE INSTRUCTIONS**   From Dr. Loleta Books: Follow up with the dialysis center on Tuesday as arranged   For your seizure medicines, what we understand from Dr. Luz Lex notes is this: You should take levetiracetam 500 mg tablet every morning  On dialysis days, in the afternoon, after dialysis, take 250 mg extra  Call Dr. Luz Lex office to arrange follow up, I will send her the discharge summary   Increase activity slowly   Complete by: As directed        Discharge Exam: Filed Weights   12/16/22 1147 12/17/22 0004 12/18/22 1040  Weight: 58 kg 63.1 kg 60.7 kg  General: Pt is alert, awake, not in acute distress Cardiovascular: RRR, nl S1-S2, no murmurs appreciated.   No LE edema.   Respiratory: Normal respiratory rate and rhythm.  CTAB without rales or wheezes. Abdominal: Abdomen soft and non-tender.  No distension or HSM.   Neuro/Psych: Strength symmetric in upper and lower extremities.  Judgment and insight appear normal.   Condition at discharge: good  The results of significant diagnostics from this hospitalization (including imaging, microbiology, ancillary and laboratory) are listed below for reference.   Imaging Studies: CT HEAD WO CONTRAST (5MM)  Result Date: 12/16/2022 CLINICAL DATA:  Mental status change.  Headache. EXAM: CT HEAD WITHOUT CONTRAST TECHNIQUE: Contiguous axial images were obtained from the base of the skull through the vertex without intravenous contrast. RADIATION DOSE REDUCTION: This exam was performed according to the departmental dose-optimization program which includes automated exposure control, adjustment of the mA  and/or kV according to patient size and/or use of iterative reconstruction technique. COMPARISON:  None Available. FINDINGS: Brain: No acute intracranial hemorrhage. No focal mass lesion. No CT evidence of acute infarction. No midline shift or mass effect. No hydrocephalus. Basilar cisterns are patent. Vascular: No hyperdense vessel. Calcification of vertebral arteries through the foramen magnum (image 3/2) Skull: Normal. Negative for fracture or focal lesion. Sinuses/Orbits: Paranasal sinuses and mastoid air cells are clear. Orbits are clear. Other: None. IMPRESSION: 1. No acute intracranial findings. 2. Age advanced atherosclerotic disease in the vertebral arteries. Electronically Signed   By: Suzy Bouchard M.D.   On: 12/16/2022 14:56   DG Chest Portable 1 View  Result Date: 12/16/2022 CLINICAL DATA:  Hyperglycemia EXAM: PORTABLE CHEST 1 VIEW COMPARISON:  11/17/2022 FINDINGS: Transverse diameter of heart is increased. Central pulmonary vessels are more prominent. There is slight prominence of interstitial markings in parahilar regions and lower lung fields. There is no focal consolidation. There is no pleural effusion or pneumothorax. Old healed fractures are seen in right fifth and sixth ribs. IMPRESSION: Cardiomegaly. Central pulmonary vessels are more prominent suggesting CHF. Increased interstitial markings are seen in parahilar regions and lower lung fields suggesting mild interstitial pulmonary edema. Electronically Signed   By: Elmer Picker M.D.   On: 12/16/2022 14:17   CT SOFT TISSUE NECK WO CONTRAST  Result Date: 12/16/2022 CLINICAL DATA:  36 year old female with left supraclavicular lymphadenopathy seen on outside thyroid ultrasound. Dialysis patient. EXAM: CT NECK WITHOUT CONTRAST TECHNIQUE: Multidetector CT imaging of the neck was performed following the standard protocol without intravenous contrast. RADIATION DOSE REDUCTION: This exam was performed according to the departmental  dose-optimization program which includes automated exposure control, adjustment of the mA and/or kV according to patient size and/or use of iterative reconstruction technique. COMPARISON:  Cervical spine CT 10/15/2021. FINDINGS: Pharynx and larynx: Noncontrast larynx and pharynx appear within normal limits. Parapharyngeal and retropharyngeal spaces are within normal limits. Salivary glands: Negative noncontrast sublingual space, submandibular and parotid glands. Thyroid: Indistinct thyroid enlargement on this noncontrast exam, although appears similar to cervical spine CT last year (series 3, image 81). This has been evaluated on previous imaging. (ref: J Am Coll Radiol. 2015 Feb;12(2): 143-50). Lymph nodes: Suboptimal evaluation of cervical lymph nodes in the absence of IV contrast. Appearance of asymmetric subcentimeter lymph nodes at the left level 4 and supraclavicular stations (such as on coronal images 58-60. And compared to the cervical spine CT last year there is probably been no significant change (series 3, image 83 today versus series 5, image 68 at that time). No level 1, level  2, or level 3 lymphadenopathy is evident. No level 5 lymphadenopathy. Vascular: Vascular patency is not evaluated in the absence of IV contrast. Some calcified external carotid artery branch atherosclerosis in the face. Calcified atherosclerosis at the skull base. Limited intracranial: Negative. Visualized orbits: Negative. Mastoids and visualized paranasal sinuses: Chronic right maxillary sinus mucosal thickening or mucous retention cyst. Other Visualized paranasal sinuses and mastoids are clear. Skeleton: No acute or suspicious osseous lesion identified. Upper chest: No significant mass effect on the trachea at the thoracic inlet. No evidence of superior mediastinal lymphadenopathy. Visible bilateral axillary lymph nodes are similar to those at the left level 4 station. Visible upper lungs are clear. IMPRESSION: 1. Indistinct  and suspicious thyroid enlargement - refer to recent outside Thyroid Ultrasound for definitive characterization. 2. Suboptimal evaluation of cervical lymph nodes in the absence of IV contrast. Asymmetric but mostly subcentimeter lymph nodes at the left lower neck, level 4. Probably no significantly changed from Cervical Spine CT in October 2022. This is near the expected location of the thoracic duct, and with no other cervical lymphadenopathy benign etiology is favored. Recommend follow-up by clinical exam, with repeat Neck CT (IV contrast preferred) if any area enlarges or becomes painful. Electronically Signed   By: [name] M.D.   On: 12/16/2022 09:59    Microbiology: Results for orders placed or performed during the hospital encounter of 08/01/22  SARS Coronavirus 2 by RT PCR (hospital order, performed in [facility] hospital lab) *cepheid single result test* Anterior Nasal Swab     Status: None   Collection Time: 08/01/22  9:52 PM   Specimen: Anterior Nasal Swab  Result Value Ref Range Status   SARS Coronavirus 2 by RT PCR NEGATIVE NEGATIVE Final    Comment: (NOTE) SARS-CoV-2 target nucleic acids are NOT DETECTED.  The SARS-CoV-2 RNA is generally detectable in upper and lower respiratory specimens during the acute phase of infection. The lowest concentration of SARS-CoV-2 viral copies this assay can detect is 250 copies / mL. A negative result does not preclude SARS-CoV-2 infection and should not be used as the sole basis for treatment or other patient management decisions.  A negative result may occur with improper specimen collection / handling, submission of specimen other than nasopharyngeal swab, presence of viral mutation(s) within the areas targeted by this assay, and inadequate number of viral copies (<250 copies / mL). A negative result must be combined with clinical observations, patient history, and epidemiological information.  Fact Sheet for Patients:    https://www.patel.info/  Fact Sheet for Healthcare Providers: https://hall.com/  This test is not yet approved or  cleared by the [place] FDA and has been authorized for detection and/or diagnosis of SARS-CoV-2 by FDA under an Emergency Use Authorization (EUA).  This EUA will remain in effect (meaning this test can be used) for the duration of the COVID-19 declaration under Section 564(b)(1) of the Act, 21 U.S.C. section 360bbb-3(b)(1), unless the authorization is terminated or revoked sooner.  Performed at [facility], [place] [address], [place], [place] [address]     Labs: CBC: Recent Labs  Lab 12/16/22 1200 12/16/22 1241 12/16/22 1243 12/16/22 1800 12/17/22 1817  WBC 4.8  --   --  5.9 7.7  NEUTROABS  --   --   --  3.5  --   HGB 8.7* 9.5* 9.5* 7.3* 7.0*  HCT 28.6* 28.0* 28.0* 22.2* 21.0*  MCV 93.8  --   --  87.7 87.5  PLT 92*  --   --  103* 280*   Basic Metabolic Panel: Recent Labs  Lab 12/16/22 1200 12/16/22 1241 12/16/22 1243 12/16/22 1800 12/16/22 2028 12/17/22 0035 12/17/22 0459 12/17/22 0823  NA 125* 124* 123*  --  130* 133* 132* 130*  K 4.6 4.6 4.6  --  3.7 3.8 3.9 3.9  CL 89* 90*  --   --  95* 96* 96* 95*  CO2 20*  --   --   --  23 22 21* 21*  GLUCOSE 942* >700*  --   --  236* 132* 136* 158*  BUN 27* 27*  --   --  28* 29* 29* 30*  CREATININE 4.89* 5.10*  --   --  5.19* 5.37* 5.54* 5.83*  CALCIUM 9.1  --   --   --  9.5 9.6 9.6 9.1  PHOS  --   --   --  3.9  --   --   --   --    Liver Function Tests: Recent Labs  Lab 12/16/22 1412 12/16/22 1800  AST 57*  --   ALT 44  --   ALKPHOS 211*  --   BILITOT 0.8  --   PROT 6.7  --   ALBUMIN 3.6 3.2*   CBG: Recent Labs  Lab 12/17/22 0612 12/17/22 1120 12/17/22 1551 12/17/22 2302 12/18/22 0616  GLUCAP 106* 207* 249* 264* 72    Discharge time spent: approximately 35 minutes spent on discharge counseling, evaluation of patient on day of  discharge, and coordination of discharge planning with nursing, social work, pharmacy and case management  Signed: Edwin Dada, MD [facility] 12/18/2022

## 2022-12-19 LAB — LEVETIRACETAM LEVEL: Levetiracetam Lvl: 18.6 ug/mL (ref 10.0–40.0)

## 2022-12-20 ENCOUNTER — Telehealth: Payer: Self-pay

## 2022-12-20 MED ORDER — PANTOPRAZOLE SODIUM 40 MG PO TBEC: 40.0000 mg | DELAYED_RELEASE_TABLET | Freq: Every day | ORAL | 1 refills | Status: DC

## 2022-12-20 NOTE — Telephone Encounter (Addendum)
Transition Care Management Follow-up Telephone Call Date of discharge and from where: 12/18/2022, [facility].  How have you been since you were released from the hospital? She stated she is just feeling weak.   Any questions or concerns? No  Items Reviewed: Did the pt receive and understand the discharge instructions provided? Yes  Medications obtained and verified? Yes - she said she has all medications except the protonix and needs a refill. She has working [employer] and said her blood sugars have been " pretty good."  She is not using an insulin pump and is administering her insulin injections. She said that Bobbie/ Diabetic Educator told her to keep the insulin pump  off until she sees her again on 01/03/2023.  Other? No  Any new allergies since your discharge? No  Dietary orders reviewed? Yes Do you have support at home? Yes , her mom.   Home Care and Equipment/Supplies: Were home health services ordered? no If so, what is the name of the agency? N/a  Has the agency set up a time to come to the patient's home? not applicable Were any new equipment or medical supplies ordered?  No What is the name of the medical supply agency? N/a Were you able to get the supplies/equipment? not applicable Do you have any questions related to the use of the equipment or supplies? No  Functional Questionnaire: (I = Independent and D = Dependent) ADLs: independent  Follow up appointments reviewed:  PCP Hospital f/u appt confirmed? Yes  Scheduled to see Dr Wynetta Emery - 01/12/2023.  [facility] f/u appt confirmed? Yes  Scheduled to see endocrinology - 12/29/2022,  Diabetic Educator - 01/03/2023. Are transportation arrangements needed?  She said she drove to dialysis this morning but understands that she is not supposed to be driving.  I explained to her that DSS will provide rides to medical appointments/ dialysis    She said she has the phone number for DSS and will call to schedule her rides.   If their condition worsens, is the pt aware to call PCP or go to the Emergency Dept.? Yes Was the patient provided with contact information for the PCP's office or ED? Yes Was to pt encouraged to call back with questions or concerns? Yes

## 2022-12-20 NOTE — Telephone Encounter (Signed)
Rx sent for Protonix.

## 2022-12-21 ENCOUNTER — Encounter: Payer: Self-pay | Admitting: Internal Medicine

## 2022-12-27 ENCOUNTER — Telehealth: Payer: Self-pay | Admitting: Internal Medicine

## 2022-12-27 NOTE — Telephone Encounter (Signed)
Phone call placed to patient's dialysis center yesterday.  I left message with nurse Jamse Belfast to have Dr. Joseph Berkshire call me regarding pt when he rounded this wk at the Center.  I left my cell #.  She also gave me his office # [phone].  I also tried to call pt's nephrologist at [facility] but had to hang up as I was on hold too long with the automated system.[name]

## 2023-01-12 ENCOUNTER — Telehealth: Payer: Self-pay | Admitting: Internal Medicine

## 2023-01-12 ENCOUNTER — Encounter: Payer: Self-pay | Admitting: Internal Medicine

## 2023-01-12 ENCOUNTER — Ambulatory Visit: Payer: Medicaid Other | Admitting: Internal Medicine

## 2023-01-12 NOTE — Telephone Encounter (Signed)
PC placed to [facility] 01/11/2023.  I left message for pt's nephrologist Dr. Kendal Hymen. My question was whether or not CT with contrast can be done if it becomes necessary to further eval cervical lymph nodes.   I did receive call back today from her nurse who left message on my cell to call back.  I was able to look in Care Everywhere and saw that Dr. Adah Salvage answer was yes to CT contrast if it is necessary. I also called and spoke with radiologist Dr. [name] today regarding CT of the neck report that was read at the end of last month.  I inquired whether he felt the nodes were large enough to be clinically significant.  He told me the nodes and the thoracic duct and without significant change from CT that was done a year prior.  However thyroid gland looks suspicious enough to warrant thyroid ultrasound.  I told him that his thyroid ultrasound was done at [facility] by patient's endocrinologist and biopsy was recently performed however I cannot see the results of the pathology report on care everywhere.  Patient was scheduled to see me this afternoon and I was hoping that she would be able to tell me the results of the report.  He recommended that if the thyroid biopsy was negative, then repeat the CAT scan in about 3 months to make sure that the nodes remained stable.  If it can be done with contrast that would be better.  If it is done with contrast, he recommends doing it several hours before her dialysis.  Patient subsequently no showed for this appointment this afternoon.  Mychart message sent to her.

## 2023-01-18 ENCOUNTER — Telehealth: Payer: Self-pay | Admitting: Internal Medicine

## 2023-01-18 NOTE — Telephone Encounter (Signed)
Rec call from Dr. Ronnald Ramp (nephrologist at [facility]) RN Lattie Haw.  She call to let me know that Dr. Ronnald Ramp said that CT with contrast can be done if deemed necessary to eval LN.  Timing of before or after dialysis does not matter.

## 2023-01-24 ENCOUNTER — Ambulatory Visit: Payer: Medicaid Other | Admitting: Internal Medicine

## 2023-01-27 ENCOUNTER — Other Ambulatory Visit: Payer: Self-pay

## 2023-01-27 ENCOUNTER — Inpatient Hospital Stay (HOSPITAL_COMMUNITY): Payer: Medicaid Other

## 2023-01-27 ENCOUNTER — Inpatient Hospital Stay (HOSPITAL_COMMUNITY)
Admission: EM | Admit: 2023-01-27 | Discharge: 2023-02-04 | DRG: 023 | Disposition: A | Discharge status: Rehabilitation Facility | Payer: Medicaid Other | Attending: Neurology | Admitting: Neurology

## 2023-01-27 ENCOUNTER — Emergency Department (HOSPITAL_COMMUNITY): Payer: Medicaid Other

## 2023-01-27 DIAGNOSIS — Z79899 Other long term (current) drug therapy: Secondary | ICD-10-CM

## 2023-01-27 DIAGNOSIS — F1210 Cannabis abuse, uncomplicated: Secondary | ICD-10-CM | POA: Diagnosis present

## 2023-01-27 DIAGNOSIS — G2581 Restless legs syndrome: Secondary | ICD-10-CM | POA: Diagnosis present

## 2023-01-27 DIAGNOSIS — J988 Other specified respiratory disorders: Secondary | ICD-10-CM | POA: Diagnosis not present

## 2023-01-27 DIAGNOSIS — E1043 Type 1 diabetes mellitus with diabetic autonomic (poly)neuropathy: Secondary | ICD-10-CM | POA: Diagnosis not present

## 2023-01-27 DIAGNOSIS — I629 Nontraumatic intracranial hemorrhage, unspecified: Secondary | ICD-10-CM | POA: Diagnosis not present

## 2023-01-27 DIAGNOSIS — R531 Weakness: Secondary | ICD-10-CM | POA: Diagnosis not present

## 2023-01-27 DIAGNOSIS — K219 Gastro-esophageal reflux disease without esophagitis: Secondary | ICD-10-CM | POA: Diagnosis present

## 2023-01-27 DIAGNOSIS — E1022 Type 1 diabetes mellitus with diabetic chronic kidney disease: Secondary | ICD-10-CM | POA: Diagnosis present

## 2023-01-27 DIAGNOSIS — G911 Obstructive hydrocephalus: Secondary | ICD-10-CM | POA: Diagnosis present

## 2023-01-27 DIAGNOSIS — Z83438 Family history of other disorder of lipoprotein metabolism and other lipidemia: Secondary | ICD-10-CM

## 2023-01-27 DIAGNOSIS — Z794 Long term (current) use of insulin: Secondary | ICD-10-CM

## 2023-01-27 DIAGNOSIS — Z881 Allergy status to other antibiotic agents status: Secondary | ICD-10-CM

## 2023-01-27 DIAGNOSIS — Z888 Allergy status to other drugs, medicaments and biological substances status: Secondary | ICD-10-CM

## 2023-01-27 DIAGNOSIS — Z87891 Personal history of nicotine dependence: Secondary | ICD-10-CM

## 2023-01-27 DIAGNOSIS — E1040 Type 1 diabetes mellitus with diabetic neuropathy, unspecified: Secondary | ICD-10-CM | POA: Diagnosis not present

## 2023-01-27 DIAGNOSIS — J9601 Acute respiratory failure with hypoxia: Secondary | ICD-10-CM | POA: Diagnosis present

## 2023-01-27 DIAGNOSIS — N186 End stage renal disease: Secondary | ICD-10-CM | POA: Diagnosis not present

## 2023-01-27 DIAGNOSIS — E10649 Type 1 diabetes mellitus with hypoglycemia without coma: Secondary | ICD-10-CM | POA: Diagnosis not present

## 2023-01-27 DIAGNOSIS — E1036 Type 1 diabetes mellitus with diabetic cataract: Secondary | ICD-10-CM | POA: Diagnosis present

## 2023-01-27 DIAGNOSIS — I615 Nontraumatic intracerebral hemorrhage, intraventricular: Secondary | ICD-10-CM | POA: Diagnosis present

## 2023-01-27 DIAGNOSIS — H35039 Hypertensive retinopathy, unspecified eye: Secondary | ICD-10-CM | POA: Diagnosis present

## 2023-01-27 DIAGNOSIS — J45909 Unspecified asthma, uncomplicated: Secondary | ICD-10-CM | POA: Diagnosis present

## 2023-01-27 DIAGNOSIS — R1310 Dysphagia, unspecified: Secondary | ICD-10-CM | POA: Diagnosis present

## 2023-01-27 DIAGNOSIS — I120 Hypertensive chronic kidney disease with stage 5 chronic kidney disease or end stage renal disease: Secondary | ICD-10-CM | POA: Diagnosis present

## 2023-01-27 DIAGNOSIS — G43909 Migraine, unspecified, not intractable, without status migrainosus: Secondary | ICD-10-CM | POA: Diagnosis present

## 2023-01-27 DIAGNOSIS — E103593 Type 1 diabetes mellitus with proliferative diabetic retinopathy without macular edema, bilateral: Secondary | ICD-10-CM | POA: Diagnosis present

## 2023-01-27 DIAGNOSIS — F419 Anxiety disorder, unspecified: Secondary | ICD-10-CM | POA: Diagnosis present

## 2023-01-27 DIAGNOSIS — R4182 Altered mental status, unspecified: Secondary | ICD-10-CM | POA: Diagnosis present

## 2023-01-27 DIAGNOSIS — F32A Depression, unspecified: Secondary | ICD-10-CM | POA: Diagnosis present

## 2023-01-27 DIAGNOSIS — Z8249 Family history of ischemic heart disease and other diseases of the circulatory system: Secondary | ICD-10-CM

## 2023-01-27 DIAGNOSIS — E1065 Type 1 diabetes mellitus with hyperglycemia: Secondary | ICD-10-CM | POA: Diagnosis present

## 2023-01-27 DIAGNOSIS — Z882 Allergy status to sulfonamides status: Secondary | ICD-10-CM

## 2023-01-27 DIAGNOSIS — E8889 Other specified metabolic disorders: Secondary | ICD-10-CM | POA: Diagnosis present

## 2023-01-27 DIAGNOSIS — Z1152 Encounter for screening for COVID-19: Secondary | ICD-10-CM | POA: Diagnosis not present

## 2023-01-27 DIAGNOSIS — J9600 Acute respiratory failure, unspecified whether with hypoxia or hypercapnia: Secondary | ICD-10-CM | POA: Diagnosis not present

## 2023-01-27 DIAGNOSIS — I610 Nontraumatic intracerebral hemorrhage in hemisphere, subcortical: Principal | ICD-10-CM | POA: Diagnosis present

## 2023-01-27 DIAGNOSIS — I619 Nontraumatic intracerebral hemorrhage, unspecified: Secondary | ICD-10-CM | POA: Diagnosis not present

## 2023-01-27 DIAGNOSIS — E871 Hypo-osmolality and hyponatremia: Secondary | ICD-10-CM | POA: Diagnosis present

## 2023-01-27 DIAGNOSIS — Z823 Family history of stroke: Secondary | ICD-10-CM

## 2023-01-27 DIAGNOSIS — Z992 Dependence on renal dialysis: Secondary | ICD-10-CM | POA: Diagnosis not present

## 2023-01-27 DIAGNOSIS — E039 Hypothyroidism, unspecified: Secondary | ICD-10-CM | POA: Diagnosis present

## 2023-01-27 DIAGNOSIS — Z79890 Hormone replacement therapy (postmenopausal): Secondary | ICD-10-CM

## 2023-01-27 DIAGNOSIS — I69198 Other sequelae of nontraumatic intracerebral hemorrhage: Secondary | ICD-10-CM | POA: Diagnosis not present

## 2023-01-27 DIAGNOSIS — H269 Unspecified cataract: Secondary | ICD-10-CM | POA: Diagnosis present

## 2023-01-27 DIAGNOSIS — E876 Hypokalemia: Secondary | ICD-10-CM | POA: Diagnosis present

## 2023-01-27 DIAGNOSIS — R2990 Unspecified symptoms and signs involving the nervous system: Secondary | ICD-10-CM | POA: Diagnosis not present

## 2023-01-27 DIAGNOSIS — I3139 Other pericardial effusion (noninflammatory): Secondary | ICD-10-CM | POA: Diagnosis present

## 2023-01-27 DIAGNOSIS — D631 Anemia in chronic kidney disease: Secondary | ICD-10-CM | POA: Diagnosis present

## 2023-01-27 DIAGNOSIS — E46 Unspecified protein-calorie malnutrition: Secondary | ICD-10-CM | POA: Diagnosis not present

## 2023-01-27 DIAGNOSIS — I161 Hypertensive emergency: Secondary | ICD-10-CM | POA: Diagnosis present

## 2023-01-27 DIAGNOSIS — R451 Restlessness and agitation: Secondary | ICD-10-CM | POA: Diagnosis not present

## 2023-01-27 DIAGNOSIS — G40909 Epilepsy, unspecified, not intractable, without status epilepticus: Secondary | ICD-10-CM | POA: Diagnosis present

## 2023-01-27 DIAGNOSIS — I6389 Other cerebral infarction: Secondary | ICD-10-CM | POA: Diagnosis not present

## 2023-01-27 DIAGNOSIS — Z833 Family history of diabetes mellitus: Secondary | ICD-10-CM

## 2023-01-27 DIAGNOSIS — E1049 Type 1 diabetes mellitus with other diabetic neurological complication: Secondary | ICD-10-CM | POA: Diagnosis not present

## 2023-01-27 DIAGNOSIS — Z91148 Patient's other noncompliance with medication regimen for other reason: Secondary | ICD-10-CM

## 2023-01-27 DIAGNOSIS — G4700 Insomnia, unspecified: Secondary | ICD-10-CM | POA: Diagnosis present

## 2023-01-27 DIAGNOSIS — D72829 Elevated white blood cell count, unspecified: Secondary | ICD-10-CM | POA: Diagnosis present

## 2023-01-27 DIAGNOSIS — K3184 Gastroparesis: Secondary | ICD-10-CM | POA: Diagnosis not present

## 2023-01-27 DIAGNOSIS — I611 Nontraumatic intracerebral hemorrhage in hemisphere, cortical: Secondary | ICD-10-CM | POA: Diagnosis not present

## 2023-01-27 DIAGNOSIS — E785 Hyperlipidemia, unspecified: Secondary | ICD-10-CM | POA: Diagnosis present

## 2023-01-27 HISTORY — DX: Nontraumatic intracerebral hemorrhage, unspecified: I61.9

## 2023-01-27 LAB — COMPREHENSIVE METABOLIC PANEL
ALT: 45 U/L — ABNORMAL HIGH (ref 0–44)
AST: 63 U/L — ABNORMAL HIGH (ref 15–41)
Albumin: 3.5 g/dL (ref 3.5–5.0)
Alkaline Phosphatase: 167 U/L — ABNORMAL HIGH (ref 38–126)
Anion gap: 12 (ref 5–15)
BUN: 14 mg/dL (ref 6–20)
CO2: 24 mmol/L (ref 22–32)
Calcium: 9.2 mg/dL (ref 8.9–10.3)
Chloride: 98 mmol/L (ref 98–111)
Creatinine, Ser: 3.07 mg/dL — ABNORMAL HIGH (ref 0.44–1.00)
GFR, Estimated: 19 mL/min — ABNORMAL LOW (ref >60)
Glucose, Bld: 361 mg/dL — ABNORMAL HIGH (ref 70–99)
Potassium: 3.7 mmol/L (ref 3.5–5.1)
Sodium: 134 mmol/L — ABNORMAL LOW (ref 135–145)
Total Bilirubin: 0.6 mg/dL (ref 0.3–1.2)
Total Protein: 6.7 g/dL (ref 6.5–8.1)

## 2023-01-27 LAB — SODIUM: Sodium: 133 mmol/L — ABNORMAL LOW (ref 135–145)

## 2023-01-27 LAB — I-STAT VENOUS BLOOD GAS, ED
Acid-Base Excess: 4 mmol/L — ABNORMAL HIGH (ref 0.0–2.0)
Bicarbonate: 29 mmol/L — ABNORMAL HIGH (ref 20.0–28.0)
Calcium, Ion: 1.17 mmol/L (ref 1.15–1.40)
HCT: 24 % — ABNORMAL LOW (ref 36.0–46.0)
Hemoglobin: 8.2 g/dL — ABNORMAL LOW (ref 12.0–15.0)
O2 Saturation: 99 %
Potassium: 3.8 mmol/L (ref 3.5–5.1)
Sodium: 135 mmol/L (ref 135–145)
TCO2: 30 mmol/L (ref 22–32)
pCO2, Ven: 42.9 mmHg — ABNORMAL LOW (ref 44–60)
pH, Ven: 7.439 — ABNORMAL HIGH (ref 7.25–7.43)
pO2, Ven: 115 mmHg — ABNORMAL HIGH (ref 32–45)

## 2023-01-27 LAB — CBC WITH DIFFERENTIAL/PLATELET
Abs Immature Granulocytes: 0.05 10*3/uL (ref 0.00–0.07)
Basophils Absolute: 0.1 10*3/uL (ref 0.0–0.1)
Basophils Relative: 1 %
Eosinophils Absolute: 0.2 10*3/uL (ref 0.0–0.5)
Eosinophils Relative: 3 %
HCT: 23.9 % — ABNORMAL LOW (ref 36.0–46.0)
Hemoglobin: 7.5 g/dL — ABNORMAL LOW (ref 12.0–15.0)
Immature Granulocytes: 1 %
Lymphocytes Relative: 16 %
Lymphs Abs: 1.5 10*3/uL (ref 0.7–4.0)
MCH: 29.4 pg (ref 26.0–34.0)
MCHC: 31.4 g/dL (ref 30.0–36.0)
MCV: 93.7 fL (ref 80.0–100.0)
Monocytes Absolute: 0.5 10*3/uL (ref 0.1–1.0)
Monocytes Relative: 6 %
Neutro Abs: 6.8 10*3/uL (ref 1.7–7.7)
Neutrophils Relative %: 73 %
Platelets: 124 10*3/uL — ABNORMAL LOW (ref 150–400)
RBC: 2.55 MIL/uL — ABNORMAL LOW (ref 3.87–5.11)
RDW: 13.5 % (ref 11.5–15.5)
WBC: 9.1 10*3/uL (ref 4.0–10.5)
nRBC: 0 % (ref 0.0–0.2)

## 2023-01-27 LAB — POCT I-STAT 7, (LYTES, BLD GAS, ICA,H+H)
Acid-Base Excess: 2 mmol/L (ref 0.0–2.0)
Bicarbonate: 26.2 mmol/L (ref 20.0–28.0)
Calcium, Ion: 1.23 mmol/L (ref 1.15–1.40)
HCT: 25 % — ABNORMAL LOW (ref 36.0–46.0)
Hemoglobin: 8.5 g/dL — ABNORMAL LOW (ref 12.0–15.0)
O2 Saturation: 100 %
Potassium: 3.6 mmol/L (ref 3.5–5.1)
Sodium: 132 mmol/L — ABNORMAL LOW (ref 135–145)
TCO2: 27 mmol/L (ref 22–32)
pCO2 arterial: 38.3 mmHg (ref 32–48)
pH, Arterial: 7.443 (ref 7.35–7.45)
pO2, Arterial: 551 mmHg — ABNORMAL HIGH (ref 83–108)

## 2023-01-27 LAB — TSH: TSH: 201.471 u[IU]/mL — ABNORMAL HIGH (ref 0.350–4.500)

## 2023-01-27 LAB — GLUCOSE, CAPILLARY
Glucose-Capillary: 261 mg/dL — ABNORMAL HIGH (ref 70–99)
Glucose-Capillary: 423 mg/dL — ABNORMAL HIGH (ref 70–99)
Glucose-Capillary: 507 mg/dL (ref 70–99)

## 2023-01-27 LAB — AMMONIA: Ammonia: 28 umol/L (ref 9–35)

## 2023-01-27 LAB — RESP PANEL BY RT-PCR (RSV, FLU A&B, COVID)  RVPGX2
Influenza A by PCR: NEGATIVE
Influenza B by PCR: NEGATIVE
Resp Syncytial Virus by PCR: NEGATIVE
SARS Coronavirus 2 by RT PCR: NEGATIVE

## 2023-01-27 LAB — LACTIC ACID, PLASMA
Lactic Acid, Venous: 1.2 mmol/L (ref 0.5–1.9)
Lactic Acid, Venous: 2.3 mmol/L (ref 0.5–1.9)

## 2023-01-27 LAB — ETHANOL: Alcohol, Ethyl (B): <10 mg/dL (ref <10)

## 2023-01-27 LAB — MRSA NEXT GEN BY PCR, NASAL: MRSA by PCR Next Gen: NOT DETECTED

## 2023-01-27 LAB — CBG MONITORING, ED: Glucose-Capillary: 338 mg/dL — ABNORMAL HIGH (ref 70–99)

## 2023-01-27 MED ORDER — BUSPIRONE HCL 15 MG PO TABS
15.0000 mg | ORAL_TABLET | Freq: Every day | ORAL | Status: DC
  Administered 2023-01-27 – 2023-02-02 (×5): 15 mg
  Filled 2023-01-27 (×5): qty 1

## 2023-01-27 MED ORDER — LEVETIRACETAM IN NACL 500 MG/100ML IV SOLN
500.0000 mg | Freq: Every day | INTRAVENOUS | Status: DC
  Filled 2023-01-27: qty 100

## 2023-01-27 MED ORDER — SODIUM CHLORIDE 3 % IV SOLN
INTRAVENOUS | Status: DC
  Filled 2023-01-27 (×2): qty 500

## 2023-01-27 MED ORDER — ESCITALOPRAM OXALATE 10 MG PO TABS
20.0000 mg | ORAL_TABLET | Freq: Every day | ORAL | Status: DC
  Administered 2023-01-28 – 2023-02-03 (×5): 20 mg
  Filled 2023-01-27 (×5): qty 2

## 2023-01-27 MED ORDER — FENTANYL CITRATE PF 50 MCG/ML IJ SOSY
50.0000 ug | PREFILLED_SYRINGE | Freq: Once | INTRAMUSCULAR | Status: AC
  Administered 2023-01-27: 50 ug via INTRAVENOUS
  Filled 2023-01-27: qty 1

## 2023-01-27 MED ORDER — FENTANYL BOLUS VIA INFUSION: 50.0000 ug | INTRAVENOUS | Status: DC | PRN

## 2023-01-27 MED ORDER — IOHEXOL 350 MG/ML SOLN
75.0000 mL | Freq: Once | INTRAVENOUS | Status: AC | PRN
  Administered 2023-01-27: 75 mL via INTRAVENOUS

## 2023-01-27 MED ORDER — PROPOFOL 1000 MG/100ML IV EMUL
0.0000 ug/kg/min | INTRAVENOUS | Status: DC
  Administered 2023-01-27 – 2023-01-28 (×4): 50 ug/kg/min via INTRAVENOUS
  Administered 2023-01-29: 5 ug/kg/min via INTRAVENOUS
  Administered 2023-01-29 – 2023-01-30 (×2): 30 ug/kg/min via INTRAVENOUS
  Administered 2023-01-30: 20 ug/kg/min via INTRAVENOUS
  Filled 2023-01-27 (×3): qty 100
  Filled 2023-01-27: qty 200
  Filled 2023-01-27 (×5): qty 100

## 2023-01-27 MED ORDER — PANTOPRAZOLE SODIUM 40 MG IV SOLR
40.0000 mg | Freq: Every day | INTRAVENOUS | Status: DC
  Administered 2023-01-27 – 2023-02-03 (×8): 40 mg via INTRAVENOUS
  Filled 2023-01-27 (×8): qty 10

## 2023-01-27 MED ORDER — ORAL CARE MOUTH RINSE
15.0000 mL | OROMUCOSAL | Status: DC
  Administered 2023-01-27 – 2023-01-30 (×34): 15 mL via OROMUCOSAL

## 2023-01-27 MED ORDER — INSULIN ASPART 100 UNIT/ML IJ SOLN
12.0000 [IU] | Freq: Once | INTRAMUSCULAR | Status: AC
  Administered 2023-01-27: 12 [IU] via SUBCUTANEOUS

## 2023-01-27 MED ORDER — SENNOSIDES-DOCUSATE SODIUM 8.6-50 MG PO TABS
1.0000 | ORAL_TABLET | Freq: Two times a day (BID) | ORAL | Status: DC
  Filled 2023-01-27: qty 1

## 2023-01-27 MED ORDER — FAMOTIDINE 20 MG PO TABS: 20.0000 mg | ORAL_TABLET | Freq: Two times a day (BID) | ORAL | Status: DC

## 2023-01-27 MED ORDER — NALOXONE HCL 0.4 MG/ML IJ SOLN
0.4000 mg | Freq: Once | INTRAMUSCULAR | Status: AC
  Administered 2023-01-27: 0.4 mg via INTRAVENOUS
  Filled 2023-01-27: qty 1

## 2023-01-27 MED ORDER — CLEVIDIPINE BUTYRATE 0.5 MG/ML IV EMUL
0.0000 mg/h | INTRAVENOUS | Status: DC
  Administered 2023-01-27: 5 mg/h via INTRAVENOUS
  Administered 2023-01-29: 2 mg/h via INTRAVENOUS
  Administered 2023-01-29: 16 mg/h via INTRAVENOUS
  Administered 2023-01-29: 10 mg/h via INTRAVENOUS
  Administered 2023-01-29: 2 mg/h via INTRAVENOUS
  Filled 2023-01-27 (×5): qty 50

## 2023-01-27 MED ORDER — ETOMIDATE 2 MG/ML IV SOLN
INTRAVENOUS | Status: DC | PRN
  Administered 2023-01-27: 20 mg via INTRAVENOUS

## 2023-01-27 MED ORDER — INSULIN ASPART 100 UNIT/ML IJ SOLN
0.0000 [IU] | INTRAMUSCULAR | Status: DC
  Administered 2023-01-27: 6 [IU] via SUBCUTANEOUS
  Administered 2023-01-28 (×2): 3 [IU] via SUBCUTANEOUS
  Administered 2023-01-29: 1 [IU] via SUBCUTANEOUS
  Administered 2023-01-29: 2 [IU] via SUBCUTANEOUS
  Administered 2023-01-29: 1 [IU] via SUBCUTANEOUS
  Administered 2023-01-29: 3 [IU] via SUBCUTANEOUS
  Administered 2023-01-29: 2 [IU] via SUBCUTANEOUS
  Administered 2023-01-29: 5 [IU] via SUBCUTANEOUS
  Administered 2023-01-30: 4 [IU] via SUBCUTANEOUS
  Administered 2023-01-30: 5 [IU] via SUBCUTANEOUS
  Administered 2023-01-30 (×2): 1 [IU] via SUBCUTANEOUS
  Administered 2023-01-31: 5 [IU] via SUBCUTANEOUS
  Administered 2023-01-31: 4 [IU] via SUBCUTANEOUS

## 2023-01-27 MED ORDER — LABETALOL HCL 5 MG/ML IV SOLN: 20.0000 mg | Freq: Once | INTRAVENOUS | Status: AC

## 2023-01-27 MED ORDER — PROPOFOL 1000 MG/100ML IV EMUL
0.0000 ug/kg/min | INTRAVENOUS | Status: DC
  Administered 2023-01-27: 20 ug/kg/min via INTRAVENOUS

## 2023-01-27 MED ORDER — FENTANYL BOLUS VIA INFUSION
50.0000 ug | INTRAVENOUS | Status: DC | PRN
  Administered 2023-01-27 – 2023-01-30 (×15): 100 ug via INTRAVENOUS

## 2023-01-27 MED ORDER — CLEVIDIPINE BUTYRATE 0.5 MG/ML IV EMUL
INTRAVENOUS | Status: AC
  Administered 2023-01-27: 8 mg/h via INTRAVENOUS
  Filled 2023-01-27: qty 50

## 2023-01-27 MED ORDER — ACETAMINOPHEN 325 MG PO TABS
650.0000 mg | ORAL_TABLET | ORAL | Status: DC | PRN
  Filled 2023-01-27: qty 2

## 2023-01-27 MED ORDER — POLYETHYLENE GLYCOL 3350 17 G PO PACK
17.0000 g | PACK | Freq: Every day | ORAL | Status: DC
  Administered 2023-01-28 – 2023-01-30 (×3): 17 g
  Filled 2023-01-27 (×3): qty 1

## 2023-01-27 MED ORDER — FAMOTIDINE IN NACL 20-0.9 MG/50ML-% IV SOLN: 20.0000 mg | Freq: Two times a day (BID) | INTRAVENOUS | Status: DC

## 2023-01-27 MED ORDER — ORAL CARE MOUTH RINSE: 15.0000 mL | OROMUCOSAL | Status: DC | PRN

## 2023-01-27 MED ORDER — LEVETIRACETAM 100 MG/ML PO SOLN
500.0000 mg | Freq: Two times a day (BID) | ORAL | Status: DC
  Administered 2023-01-27 – 2023-01-30 (×6): 500 mg
  Filled 2023-01-27 (×6): qty 5

## 2023-01-27 MED ORDER — LEVETIRACETAM 250 MG PO TABS
250.0000 mg | ORAL_TABLET | ORAL | Status: DC
  Administered 2023-01-27: 250 mg
  Filled 2023-01-27 (×2): qty 1

## 2023-01-27 MED ORDER — LABETALOL HCL 5 MG/ML IV SOLN
INTRAVENOUS | Status: AC
  Administered 2023-01-27: 20 mg via INTRAVENOUS
  Filled 2023-01-27: qty 4

## 2023-01-27 MED ORDER — ROCURONIUM BROMIDE 50 MG/5ML IV SOLN
INTRAVENOUS | Status: DC | PRN
  Administered 2023-01-27: 60 mg via INTRAVENOUS

## 2023-01-27 MED ORDER — SENNOSIDES-DOCUSATE SODIUM 8.6-50 MG PO TABS
1.0000 | ORAL_TABLET | Freq: Two times a day (BID) | ORAL | Status: DC
  Administered 2023-01-27 – 2023-02-02 (×7): 1
  Filled 2023-01-27 (×6): qty 1

## 2023-01-27 MED ORDER — ACETAMINOPHEN 650 MG RE SUPP: 650.0000 mg | RECTAL | Status: DC | PRN

## 2023-01-27 MED ORDER — FENTANYL 2500MCG IN NS 250ML (10MCG/ML) PREMIX INFUSION
50.0000 ug/h | INTRAVENOUS | Status: DC
  Administered 2023-01-27: 50 ug/h via INTRAVENOUS
  Filled 2023-01-27: qty 250

## 2023-01-27 MED ORDER — ETOMIDATE 2 MG/ML IV SOLN
INTRAVENOUS | Status: AC
  Administered 2023-01-27: 20 mg via INTRAVENOUS
  Filled 2023-01-27: qty 10

## 2023-01-27 MED ORDER — LEVOTHYROXINE SODIUM 75 MCG PO TABS
175.0000 ug | ORAL_TABLET | Freq: Every morning | ORAL | Status: DC
  Administered 2023-01-28 – 2023-02-03 (×5): 175 ug
  Filled 2023-01-27 (×5): qty 1

## 2023-01-27 MED ORDER — CLEVIDIPINE BUTYRATE 0.5 MG/ML IV EMUL: 0.0000 mg/h | INTRAVENOUS | Status: DC

## 2023-01-27 MED ORDER — CHLORHEXIDINE GLUCONATE CLOTH 2 % EX PADS
6.0000 | MEDICATED_PAD | Freq: Every day | CUTANEOUS | Status: DC
  Administered 2023-01-27 – 2023-02-04 (×9): 6 via TOPICAL

## 2023-01-27 MED ORDER — INSULIN GLARGINE-YFGN 100 UNIT/ML ~~LOC~~ SOLN
15.0000 [IU] | Freq: Every day | SUBCUTANEOUS | Status: DC
  Filled 2023-01-27: qty 0.15

## 2023-01-27 MED ORDER — LABETALOL HCL 5 MG/ML IV SOLN: 20.0000 mg | Freq: Once | INTRAVENOUS | Status: DC

## 2023-01-27 MED ORDER — DOCUSATE SODIUM 50 MG/5ML PO LIQD
100.0000 mg | Freq: Two times a day (BID) | ORAL | Status: DC
  Administered 2023-01-27 – 2023-01-30 (×6): 100 mg
  Filled 2023-01-27 (×6): qty 10

## 2023-01-27 MED ORDER — STROKE: EARLY STAGES OF RECOVERY BOOK: Freq: Once | Status: DC

## 2023-01-27 MED ORDER — INSULIN GLARGINE-YFGN 100 UNIT/ML ~~LOC~~ SOLN
15.0000 [IU] | Freq: Every day | SUBCUTANEOUS | Status: DC
  Administered 2023-01-27: 15 [IU] via SUBCUTANEOUS
  Filled 2023-01-27 (×2): qty 0.15

## 2023-01-27 MED ORDER — PRAVASTATIN SODIUM 10 MG PO TABS: 20.0000 mg | ORAL_TABLET | Freq: Every day | ORAL | Status: DC

## 2023-01-27 MED ORDER — INSULIN GLARGINE-YFGN 100 UNIT/ML ~~LOC~~ SOLN
10.0000 [IU] | Freq: Every day | SUBCUTANEOUS | Status: DC
  Filled 2023-01-27: qty 0.1

## 2023-01-27 MED ORDER — FENTANYL 2500MCG IN NS 250ML (10MCG/ML) PREMIX INFUSION
0.0000 ug/h | INTRAVENOUS | Status: DC
  Administered 2023-01-27 – 2023-01-28 (×3): 300 ug/h via INTRAVENOUS
  Administered 2023-01-29: 100 ug/h via INTRAVENOUS
  Administered 2023-01-29: 125 ug/h via INTRAVENOUS
  Filled 2023-01-27 (×4): qty 250

## 2023-01-27 MED ORDER — LEVETIRACETAM IN NACL 500 MG/100ML IV SOLN: 500.0000 mg | Freq: Two times a day (BID) | INTRAVENOUS | Status: DC

## 2023-01-27 MED ORDER — ACETAMINOPHEN 160 MG/5ML PO SOLN
650.0000 mg | ORAL | Status: DC | PRN
  Administered 2023-01-28 – 2023-02-03 (×6): 650 mg
  Filled 2023-01-27 (×5): qty 20.3

## 2023-01-27 MED ORDER — RENA-VITE PO TABS
1.0000 | ORAL_TABLET | Freq: Every day | ORAL | Status: DC
  Administered 2023-01-27 – 2023-02-02 (×5): 1
  Filled 2023-01-27 (×8): qty 1

## 2023-01-27 MED ORDER — FAMOTIDINE 20 MG PO TABS
20.0000 mg | ORAL_TABLET | Freq: Every day | ORAL | Status: DC
  Administered 2023-01-27: 20 mg
  Filled 2023-01-27: qty 1

## 2023-01-27 NOTE — ED Provider Notes (Signed)
[facility] Provider Note   CSN: [id number] Arrival date & time: 01/27/23  1516     History  Chief Complaint  Patient presents with   Altered Mental Status    Angela [name] is a 37 y.o. female.  HPI 37 year old female with multiple co-morbidities including ESRD on dialysis, seizure disorder, diabetes, hypertension presents with altered mental status.  Patient is currently altered so the history is taken from the nurses spoke to EMS who is no longer present when I am seeing the patient.  By report she was not feeling well going to dialysis this morning.  While at dialysis after about an hour of treatment she syncopized.  Seems like her eyes rolled back in her head.  There is no report of seizure-like activity.  They were about to start CPR but found that she had a pulse.  They had also given her 0.2 mg clonidine prior to dialysis though is unknown what her blood pressure was.  Home Medications Prior to Admission medications   Medication Sig Start Date End Date Taking? Authorizing Provider  albuterol (PROVENTIL HFA) 108 (90 Base) MCG/ACT inhaler Inhale 2 puffs into the lungs every 6 (six) hours as needed for wheezing or shortness of breath. 08/19/21   [name], MD  amLODipine (NORVASC) 10 MG tablet Take 1 tablet (10 mg total) by mouth every morning. Needs to be seen prior to next refill request Patient taking differently: Take 10 mg by mouth daily. 08/24/22   [name], MD  busPIRone (BUSPAR) 15 MG tablet TAKE ONE TABLET BY MOUTH EVERYDAY AT BEDTIME Patient taking differently: Take 15 mg by mouth at bedtime. 11/17/22   [name], MD  Continuous Blood Gluc Sensor (DEXCOM G6 SENSOR) MISC APPLY 1 SENSOR EVERY 10 DAYS 07/15/21   [provider]  Continuous Blood Gluc Sensor (DEXCOM G6 SENSOR) MISC SMARTSIG:1 Topical Every 10 Days 10/11/21   [provider]  Continuous Blood Gluc Transmit (DEXCOM G6  TRANSMITTER) MISC See admin instructions. 03/04/21   [provider]  diltiazem (CARDIZEM CD) 240 MG 24 hr capsule Take 240 mg by mouth daily. 08/27/21   [provider]  escitalopram (LEXAPRO) 20 MG tablet TAKE ONE TABLET BY MOUTH ONCE DAILY Patient taking differently: Take 20 mg by mouth daily. 11/17/22   [name], MD  furosemide (LASIX) 40 MG tablet Take 2 tablets (80 mg total) by mouth daily. 12/01/21   Janina Mayo, MD  gabapentin (NEURONTIN) 300 MG capsule TAKE TWO CAPSULES BY MOUTH EVERY MORNING and TAKE TWO CAPSULES BY MOUTH EVERYDAY AT BEDTIME Patient taking differently: Take 600 mg by mouth 2 (two) times daily. 11/17/22   [name], MD  hydrALAZINE (APRESOLINE) 100 MG tablet Take 100 mg by mouth 2 (two) times daily.    [provider]  insulin aspart (NOVOLOG FLEXPEN) 100 UNIT/ML FlexPen Inject 2-8 Units into the skin 3 (three) times daily with meals. Per sliding scale    [provider]  Insulin Pen Needle (COMFORT EZ PEN NEEDLES) 32G X 4 MM MISC USE TO INJECT insulin ONCE DAILY AS DIRECTED 12/16/22   [name], MD  Insulin Syringe-Needle U-100 (BD INSULIN SYRINGE ULTRAFINE) 31G X 15/64" 1 ML MISC Used to give daily insulin injections. 04/24/18   [name], MD  levETIRAcetam (KEPPRA) 250 MG tablet TAKE 1 TABLET EVERY MONDAY, WEDNESDAY, FRIDAY AFTER DIALYSIS. TAKE IN ADDITION TO 500 MG DAILY 12/18/22  Danford, Suann Larry, MD  levETIRAcetam (KEPPRA) 500 MG tablet Take 1 tablet (500 mg total) by mouth daily. 12/18/22   Edwin Dada, MD  levothyroxine (SYNTHROID) 175 MCG tablet Take 175 mcg by mouth every morning. 06/09/21   [provider]  losartan (COZAAR) 50 MG tablet Take 1.5 tablets (75 mg total) by mouth daily. 12/01/21   Janina Mayo, MD  multivitamin (RENA-VIT) TABS tablet Take 1 tablet by mouth at bedtime. 10/01/21   [provider]  NOVOLOG 100 UNIT/ML injection USE AS DIRECTED in  insulin pump Patient not taking: Reported on 12/17/2022 11/01/21   [name], MD  ondansetron (ZOFRAN-ODT) 4 MG disintegrating tablet 61m ODT q4 hours prn nausea/vomit Patient taking differently: Take 4 mg by mouth every 8 (eight) hours as needed for nausea or vomiting. 11/18/22   POrpah Greek MD  pantoprazole (PROTONIX) 40 MG tablet Take 1 tablet (40 mg total) by mouth daily. 12/20/22   JLadell Pier MD  pravastatin (PRAVACHOL) 20 MG tablet TAKE ONE TABLET BY MOUTH EVERYDAY AT BEDTIME Patient taking differently: Take 20 mg by mouth daily. 12/16/22   JLadell Pier MD  TRESIBA FLEXTOUCH 100 UNIT/ML FlexTouch Pen Inject 12 Units into the skin daily as needed (high blood sugar). 05/31/21   [provider]  metoCLOPramide (REGLAN) 5 MG tablet Take 1 tablet (5 mg total) by mouth 3 (three) times daily as needed for nausea or vomiting. Patient not taking: Reported on 06/25/2020 02/28/19 06/25/20  JLadell Pier MD      Allergies    Ferumoxytol, Lisinopril, Sulfamethoxazole, and Trimethoprim    Review of Systems   Review of Systems  Unable to perform ROS: Mental status change    Physical Exam Updated Vital Signs BP (!) 198/99   Pulse 84   Temp (!) 96.6 F (35.9 C)   Resp 20   SpO2 100%  Physical Exam Vitals and nursing note reviewed.  Constitutional:      Appearance: She is well-developed.  HENT:     Head: Normocephalic and atraumatic.  Cardiovascular:     Rate and Rhythm: Normal rate and regular rhythm.     Heart sounds: Normal heart sounds.  Pulmonary:     Effort: Pulmonary effort is normal.     Breath sounds: Normal breath sounds. No wheezing.  Abdominal:     General: There is no distension.     Palpations: Abdomen is soft.     Tenderness: There is no abdominal tenderness.  Musculoskeletal:     Cervical back: Neck supple. No rigidity.  Skin:    General: Skin is warm and dry.  Neurological:     Mental Status: She is lethargic.      Comments: Patient appears altered.  She is sleepy but will awaken to touch.  She is slow to respond but will move all 4 extremities and it seems equal.     ED Results / Procedures / Treatments   Labs (all labs ordered are listed, but only abnormal results are displayed) Labs Reviewed  COMPREHENSIVE METABOLIC PANEL - Abnormal; Notable for the following components:      Result Value   Sodium 134 (*)    Glucose, Bld 361 (*)    Creatinine, Ser 3.07 (*)    AST 63 (*)    ALT 45 (*)    Alkaline Phosphatase 167 (*)    GFR, Estimated 19 (*)    All other components within normal limits  CBC WITH DIFFERENTIAL/PLATELET -  Abnormal; Notable for the following components:   RBC 2.55 (*)    Hemoglobin 7.5 (*)    HCT 23.9 (*)    Platelets 124 (*)    All other components within normal limits  CBG MONITORING, ED - Abnormal; Notable for the following components:   Glucose-Capillary 338 (*)    All other components within normal limits  I-STAT VENOUS BLOOD GAS, ED - Abnormal; Notable for the following components:   pH, Ven 7.439 (*)    pCO2, Ven 42.9 (*)    pO2, Ven 115 (*)    Bicarbonate 29.0 (*)    Acid-Base Excess 4.0 (*)    HCT 24.0 (*)    Hemoglobin 8.2 (*)    All other components within normal limits  RESP PANEL BY RT-PCR (RSV, FLU A&B, COVID)  RVPGX2  CULTURE, BLOOD (ROUTINE X 2)  CULTURE, BLOOD (ROUTINE X 2)  AMMONIA  LACTIC ACID, PLASMA  LACTIC ACID, PLASMA  ETHANOL  LEVETIRACETAM LEVEL  RAPID URINE DRUG SCREEN, HOSP PERFORMED    EKG EKG Interpretation  Date/Time:  Friday January 27 2023 15:35:24 EST Ventricular Rate:  84 PR Interval:  170 QRS Duration: 95 QT Interval:  395 QTC Calculation: 467 R Axis:   -53 Text Interpretation: Sinus rhythm LAD, consider left anterior fascicular block Consider anterior infarct Confirmed by Sherwood Gambler [phone]) on 01/27/2023 3:48:11 PM  Radiology CT Head Wo Contrast  Result Date: 01/27/2023 CLINICAL DATA:  Mental status change, cause.  EXAM: CT HEAD WITHOUT CONTRAST TECHNIQUE: Contiguous axial images were obtained from the base of the skull through the vertex without intravenous contrast. RADIATION DOSE REDUCTION: This exam was performed according to the departmental dose-optimization program which includes automated exposure control, adjustment of the mA and/or kV according to patient size and/or use of iterative reconstruction technique. COMPARISON:  Head CT December 16, 2022. FINDINGS: Brain: A 4.3 x 2.3 x 2.4 cm hematoma within the left lateral ventricle (estimated volume 12 mL). There is red is strip view shin to the right lateral ventricle, third and fourth ventricles. There is mild hydrocephalus with mild prominence of the temporal and occipital horns. No intraparenchymal hemorrhage or ischemic infarct. No midline shift. Vascular: Calcified plaques in the bilateral carotid siphons and intracranial vertebral arteries. No hyperdense vessel. Skull: Normal. Negative for fracture or focal lesion. Sinuses/Orbits: Large mucous retention cyst in the right maxillary sinus. The orbits are maintained. Other: None. IMPRESSION: A 4.3 x 2.3 x 2.4 cm hematoma within the left lateral ventricle, with intraventricular extension. 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 These results were called by telephone at the time of interpretation on 01/27/2023 at 4:49 pm to provider Sherwood Gambler , who verbally acknowledged these results. Electronically Signed   By: Pedro Earls M.D.   On: 01/27/2023 16:49    Procedures .Critical Care  Performed by: Sherwood Gambler, MD Authorized by: Sherwood Gambler, MD   Critical care provider statement:    Critical care time (minutes):  80   Critical care time was exclusive of:  Separately billable procedures and treating other patients   Critical care was necessary to treat or prevent imminent or life-threatening deterioration of the following conditions:  Respiratory  failure and CNS failure or compromise   Critical care was time spent personally by me on the following activities:  Development of treatment plan with patient or surrogate, discussions with consultants, evaluation of patient's response to treatment, examination of patient, ordering and review of laboratory studies, ordering  and review of radiographic studies, ordering and performing treatments and interventions, pulse oximetry, re-evaluation of patient's condition and review of old charts Procedure Name: Intubation Date/Time: 01/27/2023 4:58 PM  Performed by: Sherwood Gambler, MDPre-anesthesia Checklist: Patient identified, Patient being monitored, Emergency Drugs available, Timeout performed and Suction available Oxygen Delivery Method: Non-rebreather mask Preoxygenation: Pre-oxygenation with 100% oxygen Induction Type: Rapid sequence Ventilation: Mask ventilation without difficulty Laryngoscope Size: Glidescope and 4 Grade View: Grade I Tube size: 7.5 mm Airway Equipment and Method: Video-laryngoscopy Placement Confirmation: ETT inserted through vocal cords under direct vision, CO2 detector and Breath sounds checked- equal and bilateral Secured at: 23 cm Tube secured with: ETT holder Dental Injury: Teeth and Oropharynx as per pre-operative assessment  Difficulty Due To: Difficult Airway- due to anterior larynx        Medications Ordered in ED Medications  labetalol (NORMODYNE) 5 MG/ML injection (has no administration in time range)  labetalol (NORMODYNE) injection 20 mg (has no administration in time range)    And  clevidipine (CLEVIPREX) infusion 0.5 mg/mL (has no administration in time range)  clevidipine (CLEVIPREX) 0.5 MG/ML infusion (has no administration in time range)  fentaNYL 2528mg in NS 25108m(1050mml) infusion-PREMIX (has no administration in time range)  fentaNYL (SUBLIMAZE) bolus via infusion 50-100 mcg (has no administration in time range)  propofol (DIPRIVAN) 1000  MG/100ML infusion (20 mcg/kg/min  61.2 kg (Order-Specific) Intravenous New Bag/Given 01/27/23 1656)  etomidate (AMIDATE) 2 MG/ML injection (has no administration in time range)   stroke: early stages of recovery book (has no administration in time range)  acetaminophen (TYLENOL) tablet 650 mg (has no administration in time range)    Or  acetaminophen (TYLENOL) 160 MG/5ML solution 650 mg (has no administration in time range)    Or  acetaminophen (TYLENOL) suppository 650 mg (has no administration in time range)  senna-docusate (Senokot-S) tablet 1 tablet (has no administration in time range)  pantoprazole (PROTONIX) injection 40 mg (has no administration in time range)  labetalol (NORMODYNE) injection 20 mg (has no administration in time range)    And  clevidipine (CLEVIPREX) infusion 0.5 mg/mL (has no administration in time range)  etomidate (AMIDATE) injection (20 mg Intravenous Given 01/27/23 1650)  rocuronium (ZEMURON) injection (3,642 mg Intravenous Given 01/27/23 1650)  naloxone (NARCAN) injection 0.4 mg (0.4 mg Intravenous Given 01/27/23 1551)  fentaNYL (SUBLIMAZE) injection 50 mcg (50 mcg Intravenous Given 01/27/23 1655)  iohexol (OMNIPAQUE) 350 MG/ML injection 75 mL (75 mLs Intravenous Contrast Given 01/27/23 1641)    ED Course/ Medical Decision Making/ A&P                            Medical Decision Making Amount and/or Complexity of Data Reviewed Labs: ordered. Radiology: ordered.  Risk Prescription drug management. Decision regarding hospitalization.   CT head images viewed/interpreted by myself and shows left intraventricular hemorrhage. I discussed CT results with radiology as well. Discussed with Dr. AroRory Percyho has added on CTA.  She was given IV labetalol and started on Cleviprex.  However her mental status seems a little worse and while she will wake up to painful stimuli, with anticipation of worsening before getting better, decision was made to intubate with neurology.  This  went well.  BP is better with Cleviprex and she will also be put on propofol.  I discussed with Dr. JonRonnald Ramp neurology who will review the films and consult.  I have updated the brother at the bedside of her  critical condition.  No blood thinners seen on chart review.       Final Clinical Impression(s) / ED Diagnoses Final diagnoses:  Intracranial hemorrhage ([place])  Acute respiratory failure, unspecified whether with hypoxia or hypercapnia [facility])    Rx / DC Orders ED Discharge Orders     None         Sherwood Gambler, MD 01/27/23 1700

## 2023-01-27 NOTE — Procedures (Signed)
Risks and benefits were discussed with the patient and family at bedside. Consent obtained. Initial insertion site was marked midpupilary line just behind the hairline on the right. Patient was prepped and draped in sterile fashion. Patient is on a continuos drip of fentanyl and propofol. Vital signs stable after administration of ativan and patient resting comfortably. 47m of Lidocaine was used for local anesthetic. Incision with a scalpel was made at the insertion point that was already determined. Hand drill was then used to make a small craniotomy. Dura was felt and then punctured with a needle. Catheter was inserted and advanced until CSF return was seen. Advanced the catheter to 645m CSF flow still present. Catheter was then tunneled through a separate insertion site and sutured down securely with nylon suture. Initial incision was suture closed with nylon as well. CSF flow through catheter still patent. Catheter was connected to external drainage system and placed at 10110mf H2O. Sterile dressing applied. Patient tolerated the procedure well and vital signs were stable throughout.

## 2023-01-27 NOTE — Progress Notes (Addendum)
[facility] Progress Note Patient Name: Angela [name] DOB: Sep 04, 1986 MRN: [id number]   Date of Service  01/27/2023  HPI/Events of Note  Suspicion for new intracranial hemorrhage.  Ventricular extension, ICH score 2.  Underlying ESRD and hypertensive emergency.  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  Currently infusing sedatives.  Concern for limited IV access.  eICU Interventions  If an additional PAD cannot be obtained, will need to discuss central line placement for infusions.  Start 3% saline at 50 cc/h.   2040 -patient is also hyperglycemic.  Started with increased aspart x 1.  Increase glargine to 15 units.  If this fails, may need to initiate insulin drip.  Intervention Category Major Interventions: Hemorrhage - evaluation and management  Marvina Danner 01/27/2023, 8:04 PM
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
## 2023-01-27 NOTE — Progress Notes (Signed)
Patient ID: Angela [name], female   DOB: 1986-06-09, 37 y.o.   MRN: [id number] We were called to look at the [facility] on this unfortunate 37yo who had syncope and headache and MS changes in HD today. HCT shows L periventricular ICH with extension into the vents with mild hydro and temporal horns. Blood tracts down into the 4th. I suspect she may progress to potentially benefit from a ventric, but prognosis for this ICH/IVH must be guarded, especially in light of her premorbid status. Would repeat HCT in am or if MS deteriorates further. Otherwise recommend supportive care, a frank discussion with family regarding goal of care (how aggressive do they want to be? She's young, but sick.), and we will follow.
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
## 2023-01-27 NOTE — Progress Notes (Signed)
Pt was transported to 4N ICU from ED via ventilator with no apparent complications. 4N RT aware and Pt is currently stable at this time.

## 2023-01-27 NOTE — Progress Notes (Addendum)
Aproximately 1630 SRN was alerted by Pincus Sanes in CT scan that his pt had ICH. SRN to bedside STAT, Dr Rory Percy notified, Pt on monitor, hypertensive, sat 100%. SRN, EDP, neurologist pharmacist at pts bedside in [place]. Labetelol 20 mg given. Cleviprex gtt started. CTA obtained. Pt returned to ED and intubated by Dr Regenia Skeeter. Neurosurgical consult placed. Continuous monitoring and gtt titration by ED and SRN staff. Pt transferred to [place] room 22 at 1730. Goal BP 130-150, HOB >30 degrees. Pupils, NIHSS q 1 hr. Bedside handoff with RN [name] complete.

## 2023-01-27 NOTE — Procedures (Signed)
Central Venous Catheter Insertion Procedure Note  Angela [name]  [id number]  August 17, 1986  Date:01/27/23  Time:10:11 PM   Provider Performing:Irelyn Perfecto D Rollene Rotunda   Procedure: Insertion of Non-tunneled Central Venous [phone]) with US guidance [id number])   Indication(s) Medication administration  Consent Risks of the procedure as well as the alternatives and risks of each were explained to the patient and/or caregiver.  Consent for the procedure was obtained and is signed in the bedside chart  Anesthesia Topical only with 1% lidocaine   Timeout Verified patient identification, verified procedure, site/side was marked, verified correct patient position, special equipment/implants available, medications/allergies/relevant history reviewed, required imaging and test results available.  Sterile Technique Maximal sterile technique including full sterile barrier drape, hand hygiene, sterile gown, sterile gloves, mask, hair covering, sterile ultrasound probe cover (if used).  Procedure Description Area of catheter insertion was cleaned with chlorhexidine and draped in sterile fashion.  With real-time ultrasound guidance a central venous catheter was placed into the left internal jugular vein. Nonpulsatile blood flow and easy flushing noted in all ports.  The catheter was sutured in place and sterile dressing applied.  Complications/Tolerance None; patient tolerated the procedure well. Chest X-ray is ordered to verify placement for internal jugular or subclavian cannulation.   Chest x-ray is not ordered for femoral cannulation.  EBL Minimal  Specimen(s) None  JD Rexene Agent [facility] 01/27/2023, 10:12 PM  Please see [email or web address] for pager details.  From 7A-7P if no response, please call [phone]. After hours, please call ELink [phone].

## 2023-01-27 NOTE — ED Triage Notes (Signed)
Pt BIB [facility] from dialysis for AMS with syncope and LOC aklong with nausea and a HA.  Pt arrived to dialysis and began having a HS.  Dialysis gave her 0.2 of Clonidine prior to dialysis.  Pt received 1 of 4 hrs of dialysis pror to syncopal episode.  They were goting to begin CPR when they realized she had pulses.   Pt has hx of seizure but no noted activity today.   217/122 97% 84 CBG 380  Per EMS pt sugar was 600 at home this am.  Pt took insulin prior to dialysis.

## 2023-01-28 ENCOUNTER — Inpatient Hospital Stay (HOSPITAL_COMMUNITY): Payer: Medicaid Other

## 2023-01-28 DIAGNOSIS — E10649 Type 1 diabetes mellitus with hypoglycemia without coma: Secondary | ICD-10-CM

## 2023-01-28 DIAGNOSIS — J988 Other specified respiratory disorders: Secondary | ICD-10-CM

## 2023-01-28 DIAGNOSIS — I6389 Other cerebral infarction: Secondary | ICD-10-CM | POA: Diagnosis not present

## 2023-01-28 DIAGNOSIS — R4182 Altered mental status, unspecified: Secondary | ICD-10-CM

## 2023-01-28 DIAGNOSIS — G911 Obstructive hydrocephalus: Secondary | ICD-10-CM

## 2023-01-28 DIAGNOSIS — I619 Nontraumatic intracerebral hemorrhage, unspecified: Secondary | ICD-10-CM | POA: Diagnosis not present

## 2023-01-28 DIAGNOSIS — J9600 Acute respiratory failure, unspecified whether with hypoxia or hypercapnia: Secondary | ICD-10-CM | POA: Diagnosis not present

## 2023-01-28 LAB — GLUCOSE, CAPILLARY
Glucose-Capillary: 30 mg/dL — CL (ref 70–99)
Glucose-Capillary: 38 mg/dL — CL (ref 70–99)
Glucose-Capillary: 96 mg/dL (ref 70–99)
Glucose-Capillary: 41 mg/dL — CL (ref 70–99)
Glucose-Capillary: 78 mg/dL (ref 70–99)
Glucose-Capillary: 29 mg/dL — CL (ref 70–99)
Glucose-Capillary: 95 mg/dL (ref 70–99)
Glucose-Capillary: 48 mg/dL — ABNORMAL LOW (ref 70–99)
Glucose-Capillary: 124 mg/dL — ABNORMAL HIGH (ref 70–99)
Glucose-Capillary: 152 mg/dL — ABNORMAL HIGH (ref 70–99)
Glucose-Capillary: 64 mg/dL — ABNORMAL LOW (ref 70–99)
Glucose-Capillary: 65 mg/dL — ABNORMAL LOW (ref 70–99)
Glucose-Capillary: 68 mg/dL — ABNORMAL LOW (ref 70–99)
Glucose-Capillary: 106 mg/dL — ABNORMAL HIGH (ref 70–99)
Glucose-Capillary: 147 mg/dL — ABNORMAL HIGH (ref 70–99)
Glucose-Capillary: 281 mg/dL — ABNORMAL HIGH (ref 70–99)

## 2023-01-28 LAB — BASIC METABOLIC PANEL
Anion gap: 12 (ref 5–15)
Anion gap: 10 (ref 5–15)
BUN: 18 mg/dL (ref 6–20)
BUN: 18 mg/dL (ref 6–20)
CO2: 25 mmol/L (ref 22–32)
CO2: 25 mmol/L (ref 22–32)
Calcium: 9.4 mg/dL (ref 8.9–10.3)
Calcium: 9.4 mg/dL (ref 8.9–10.3)
Chloride: 101 mmol/L (ref 98–111)
Chloride: 105 mmol/L (ref 98–111)
Creatinine, Ser: 3.83 mg/dL — ABNORMAL HIGH (ref 0.44–1.00)
Creatinine, Ser: 4.62 mg/dL — ABNORMAL HIGH (ref 0.44–1.00)
GFR, Estimated: 15 mL/min — ABNORMAL LOW (ref >60)
GFR, Estimated: 12 mL/min — ABNORMAL LOW (ref >60)
Glucose, Bld: 61 mg/dL — ABNORMAL LOW (ref 70–99)
Glucose, Bld: 64 mg/dL — ABNORMAL LOW (ref 70–99)
Potassium: 2.9 mmol/L — ABNORMAL LOW (ref 3.5–5.1)
Potassium: 3.5 mmol/L (ref 3.5–5.1)
Sodium: 138 mmol/L (ref 135–145)
Sodium: 140 mmol/L (ref 135–145)

## 2023-01-28 LAB — HEMOGLOBIN AND HEMATOCRIT, BLOOD
HCT: 26.7 % — ABNORMAL LOW (ref 36.0–46.0)
Hemoglobin: 8.6 g/dL — ABNORMAL LOW (ref 12.0–15.0)

## 2023-01-28 LAB — RAPID HIV SCREEN (HIV 1/2 AB+AG)
HIV 1/2 Antibodies: NONREACTIVE
HIV-1 P24 Antigen - HIV24: NONREACTIVE

## 2023-01-28 LAB — CBC
HCT: 20.1 % — ABNORMAL LOW (ref 36.0–46.0)
HCT: 19.6 % — ABNORMAL LOW (ref 36.0–46.0)
Hemoglobin: 6.6 g/dL — CL (ref 12.0–15.0)
Hemoglobin: 6.4 g/dL — CL (ref 12.0–15.0)
MCH: 29.5 pg (ref 26.0–34.0)
MCH: 29.5 pg (ref 26.0–34.0)
MCHC: 32.8 g/dL (ref 30.0–36.0)
MCHC: 32.7 g/dL (ref 30.0–36.0)
MCV: 89.7 fL (ref 80.0–100.0)
MCV: 90.3 fL (ref 80.0–100.0)
Platelets: 113 10*3/uL — ABNORMAL LOW (ref 150–400)
Platelets: 112 10*3/uL — ABNORMAL LOW (ref 150–400)
RBC: 2.24 MIL/uL — ABNORMAL LOW (ref 3.87–5.11)
RBC: 2.17 MIL/uL — ABNORMAL LOW (ref 3.87–5.11)
RDW: 13.4 % (ref 11.5–15.5)
RDW: 13.4 % (ref 11.5–15.5)
WBC: 7.7 10*3/uL (ref 4.0–10.5)
WBC: 7.7 10*3/uL (ref 4.0–10.5)
nRBC: 0 % (ref 0.0–0.2)
nRBC: 0 % (ref 0.0–0.2)

## 2023-01-28 LAB — ECHOCARDIOGRAM COMPLETE
Area-P 1/2: 2.45 cm2
Height: 65 in
MV M vel: 1.28 m/s
MV Peak grad: 6.6 mmHg
S' Lateral: 2.7 cm
Weight: 2324.53 oz

## 2023-01-28 LAB — LIPID PANEL
Cholesterol: 247 mg/dL — ABNORMAL HIGH (ref 0–200)
HDL: 119 mg/dL (ref >40)
LDL Cholesterol: 115 mg/dL — ABNORMAL HIGH (ref 0–99)
Total CHOL/HDL Ratio: 2.1 RATIO
Triglycerides: 65 mg/dL (ref <150)
VLDL: 13 mg/dL (ref 0–40)

## 2023-01-28 LAB — RAPID URINE DRUG SCREEN, HOSP PERFORMED
Amphetamines: NOT DETECTED
Barbiturates: NOT DETECTED
Benzodiazepines: NOT DETECTED
Cocaine: NOT DETECTED
Opiates: NOT DETECTED
Tetrahydrocannabinol: POSITIVE — AB

## 2023-01-28 LAB — MAGNESIUM
Magnesium: 1.7 mg/dL (ref 1.7–2.4)
Magnesium: 2 mg/dL (ref 1.7–2.4)

## 2023-01-28 LAB — PHOSPHORUS
Phosphorus: 4.8 mg/dL — ABNORMAL HIGH (ref 2.5–4.6)
Phosphorus: 4.9 mg/dL — ABNORMAL HIGH (ref 2.5–4.6)

## 2023-01-28 LAB — SODIUM
Sodium: 138 mmol/L (ref 135–145)
Sodium: 140 mmol/L (ref 135–145)

## 2023-01-28 LAB — PREPARE RBC (CROSSMATCH)

## 2023-01-28 LAB — LEVETIRACETAM LEVEL: Levetiracetam Lvl: 13.5 ug/mL (ref 10.0–40.0)

## 2023-01-28 LAB — HEMOGLOBIN A1C
Hgb A1c MFr Bld: 12 % — ABNORMAL HIGH (ref 4.8–5.6)
Mean Plasma Glucose: 297.7 mg/dL

## 2023-01-28 LAB — HEPATITIS B CORE ANTIBODY, IGM: Hep B C IgM: NONREACTIVE

## 2023-01-28 LAB — T4, FREE: Free T4: 0.77 ng/dL (ref 0.61–1.12)

## 2023-01-28 LAB — HEPATITIS B SURFACE ANTIGEN: Hepatitis B Surface Ag: NONREACTIVE

## 2023-01-28 LAB — TRIGLYCERIDES: Triglycerides: 64 mg/dL (ref <150)

## 2023-01-28 MED ORDER — HEPARIN SODIUM (PORCINE) 5000 UNIT/ML IJ SOLN
5000.0000 [IU] | Freq: Three times a day (TID) | INTRAMUSCULAR | Status: DC
  Administered 2023-01-28 – 2023-02-04 (×21): 5000 [IU] via SUBCUTANEOUS
  Filled 2023-01-28 (×22): qty 1

## 2023-01-28 MED ORDER — DEXTROSE 50 % IV SOLN
25.0000 g | INTRAVENOUS | Status: AC
  Administered 2023-01-28: 25 g via INTRAVENOUS

## 2023-01-28 MED ORDER — DEXTROSE 50 % IV SOLN
12.5000 g | INTRAVENOUS | Status: AC
  Administered 2023-01-28: 12.5 g via INTRAVENOUS
  Filled 2023-01-28: qty 50

## 2023-01-28 MED ORDER — MAGNESIUM SULFATE 2 GM/50ML IV SOLN
2.0000 g | Freq: Once | INTRAVENOUS | Status: AC
  Administered 2023-01-28: 2 g via INTRAVENOUS
  Filled 2023-01-28: qty 50

## 2023-01-28 MED ORDER — POTASSIUM CHLORIDE 20 MEQ PO PACK
40.0000 meq | PACK | Freq: Four times a day (QID) | ORAL | Status: AC
  Administered 2023-01-28 (×2): 40 meq
  Filled 2023-01-28 (×2): qty 2

## 2023-01-28 MED ORDER — VITAL HIGH PROTEIN PO LIQD
1000.0000 mL | ORAL | Status: DC
  Administered 2023-01-28: 1000 mL

## 2023-01-28 MED ORDER — DEXTROSE 50 % IV SOLN
INTRAVENOUS | Status: AC
  Administered 2023-01-28: 50 mL
  Filled 2023-01-28: qty 50

## 2023-01-28 MED ORDER — DEXTROSE 10 % IV SOLN: INTRAVENOUS | Status: AC

## 2023-01-28 MED ORDER — VITAL 1.5 CAL PO LIQD
1000.0000 mL | ORAL | Status: DC
  Administered 2023-01-28: 1000 mL

## 2023-01-28 MED ORDER — SODIUM CHLORIDE 0.9% IV SOLUTION: Freq: Once | INTRAVENOUS | Status: AC

## 2023-01-28 MED ORDER — DEXTROSE 50 % IV SOLN: 25.0000 g | INTRAVENOUS | Status: AC

## 2023-01-28 MED ORDER — DEXTROSE 50 % IV SOLN
INTRAVENOUS | Status: AC
  Filled 2023-01-28: qty 50

## 2023-01-28 MED ORDER — DEXTROSE 50 % IV SOLN
INTRAVENOUS | Status: AC
  Administered 2023-01-28: 25 g via INTRAVENOUS
  Filled 2023-01-28: qty 50

## 2023-01-28 MED ORDER — PROSOURCE TF20 ENFIT COMPATIBL EN LIQD
60.0000 mL | Freq: Every day | ENTERAL | Status: DC
  Administered 2023-01-28 – 2023-02-03 (×5): 60 mL
  Filled 2023-01-28 (×5): qty 60

## 2023-01-28 MED ORDER — INSULIN GLARGINE-YFGN 100 UNIT/ML ~~LOC~~ SOLN
10.0000 [IU] | Freq: Every day | SUBCUTANEOUS | Status: DC
  Filled 2023-01-28: qty 0.1

## 2023-01-28 MED ORDER — SODIUM CHLORIDE 4 MEQ/ML IV SOLN
INTRAVENOUS | Status: DC
  Filled 2023-01-28 (×3): qty 1000

## 2023-01-28 MED ORDER — CHLORHEXIDINE GLUCONATE CLOTH 2 % EX PADS
6.0000 | MEDICATED_PAD | Freq: Every day | CUTANEOUS | Status: DC
  Administered 2023-01-29: 6 via TOPICAL

## 2023-01-28 NOTE — Progress Notes (Signed)
  Echocardiogram 2D Echocardiogram has been performed.  Angela [name] 01/28/2023, 10:36 AM

## 2023-01-28 NOTE — Progress Notes (Addendum)
NEUROSURGERY PROGRESS NOTE  S/p EVD placement yesterday evening. Patients neurologic function is unchanged, still no FC. Drain is patent and putting out about 20cc per hour. Continue drain at 10H20. CT stable, ventric in good position, ventricles have decreased a little in size.   Temp:  [94.7 F (34.8 C)-99.1 F (37.3 C)] 98.8 F (37.1 C) (02/10 0000) Pulse Rate:  [74-87] 78 (02/10 0000) Resp:  [16-23] 18 (02/10 0000) BP: (111-207)/(68-113) 116/75 (02/10 0000) SpO2:  [98 %-100 %] 98 % (02/10 0000) FiO2 (%):  [40 %-60 %] 40 % (02/09 2335) Weight:  [65.9 kg] 65.9 kg (02/09 1754)    Eleonore Chiquito, NP 01/28/2023 3:33 AM
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
## 2023-01-28 NOTE — Consult Note (Signed)
NAME:  Angela [name], MRN:  [id number], DOB:  19-Mar-1986, LOS: 1 ADMISSION DATE:  01/27/2023, CONSULTATION DATE:  2/9 REFERRING MD:  Dr. Rory Percy, CHIEF COMPLAINT:  Altered Mental Status   History of Present Illness:  37 y/o F who presented to [facility] on 2/9 with reports of altered mental status.    The patient was reported at dialysis and after an hour of treatment she had a syncopal episode.  Her eyes rolled back into her head but there was no seizure activity. Staff at HD center were about to start CPR but they had a pulse.  She was hypertensive in the center and was given 0.2 mg clonidine prior to HD.   In ER, patient blood pressure was 207/113, she was started on Cleviprex infusion, after given 30 mg of IV labetalol.  CT head was done which showed left basal ganglia intraparenchymal hemorrhage with extension to IVH, CTA head and neck was negative for aneurysm.  As patient was found obtunded, she was intubated.  PCCM was consulted to help medical management  Pertinent  Medical History  DM I with DM retinopathy , ESRD on HD, Depression, HTN, HLD, Hypothyroidism, Anemia  Significant Hospital Events: Including procedures, antibiotic start and stop dates in addition to other pertinent events   2/09 Admit with ICH, neurosurgery consulted and EVD placed 2/10 hypoglycemic episodes  Interim History / Subjective:  Had EVD placed overnight.  Objective   Blood pressure (!) 140/83, pulse 70, temperature 97.9 F (36.6 C), temperature source Esophageal, resp. rate 18, height 5' 5"$  (1.651 m), weight 65.9 kg, SpO2 99 %.    Vent Mode: PRVC FiO2 (%):  [40 %-60 %] 40 % Set Rate:  [18 bmp] 18 bmp Vt Set:  [450 mL] 450 mL PEEP:  [5 cmH20] 5 cmH20 Plateau Pressure:  [14 cmH20-17 cmH20] 14 cmH20   Intake/Output Summary (Last 24 hours) at 01/28/2023 0857 Last data filed at 01/28/2023 [id number] Gross per 24 hour  Intake 1504.05 ml  Output 1261 ml  Net 243.05 ml   Filed Weights   01/27/23 1754  Weight: 65.9  kg    Examination:  General - sedated Eyes - pupils sluggish ENT - ETT in place Cardiac - regular rate/rhythm, no murmur Chest - equal breath sounds b/l, no wheezing or rales Abdomen - soft, non tender, + bowel sounds Extremities - no cyanosis, clubbing, or edema Skin - no rashes Neuro - RASS -3  Resolved Hospital Problem list     Assessment & Plan:   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 - EVD per neurosurgery - f/u neuro imaging per neurology - continue 3% saline - keppra for seizure prophylaxis  HTN emergency. - goal SBP 130 to 150 - f/u Echo  Compromised airway. 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 Episodes of hypoglycemia noted 2/10. - continue D10 IV fluid - start tube feeds - SSI - hold semglee for now  Hx of hypothyroidism. - continue synthroid  ESRD. Hypokalemia. - consult nephrology for iHD - replace potassium  Anemia of chronic disease. - f/u CBC - transfuse for Hb < 7  Hx of depression. - continue buspar, lexapro  Best Practice (right click and "Reselect all SmartList Selections" daily)   Diet/type: tubefeeds DVT prophylaxis: SCD GI prophylaxis: PPI Lines: Central line Foley:  N/A Code Status:  full code Last date of multidisciplinary goals of care discussion [  updated her mother at bedside]  Labs       Latest Ref Rng & Units 01/28/2023    3:00 AM 01/27/2023    9:12 PM 01/27/2023    5:46 PM  CMP  Glucose 70 - 99 mg/dL 61     BUN 6 - 20 mg/dL 18     Creatinine 0.44 - 1.00 mg/dL 3.83     Sodium 135 - 145 mmol/L 135 - 145 mmol/L 138    138  133  132   Potassium 3.5 - 5.1 mmol/L 2.9   3.6   Chloride 98 - 111 mmol/L 101     CO2 22 - 32 mmol/L 25     Calcium 8.9 - 10.3 mg/dL 9.4          Latest Ref Rng & Units 01/28/2023    4:15 AM 01/28/2023    3:00 AM 01/27/2023    5:46 PM  CBC  WBC 4.0 - 10.5 K/uL 7.7  7.7     Hemoglobin 12.0 - 15.0 g/dL 6.4  6.6  C 8.5   Hematocrit 36.0 - 46.0 % 19.6  20.1  25.0   Platelets 150 - 400 K/uL 112  113      C Corrected result    ABG    Component Value Date/Time   PHART 7.443 01/27/2023 1746   PCO2ART 38.3 01/27/2023 1746   PO2ART 551 (H) 01/27/2023 1746   HCO3 26.2 01/27/2023 1746   TCO2 27 01/27/2023 1746   ACIDBASEDEF 6.0 (H) 12/16/2022 1243   O2SAT 100 01/27/2023 1746    CBG (last 3)  Recent Labs    01/28/23 0607 01/28/23 0810 01/28/23 0838  GLUCAP 78 29* 95    Critical care time: 39 minutes  Chesley Mires, MD [place] Pager - [phone] or [phone] 01/28/2023, 9:06 AM

## 2023-01-28 NOTE — Progress Notes (Signed)
OT Cancellation Note  Patient Details Name: Angela [name] MRN: [id number] DOB: 27-Aug-1986   Cancelled Treatment:    Reason Eval/Treat Not Completed: Active bedrest order (Bedrest 2/9 x24 hours starting at 1644. OT evaluation to f/u when activity orders progress.)  Elliot Cousin 01/28/2023, 7:13 AM

## 2023-01-28 NOTE — Progress Notes (Addendum)
2000- This RN notified CCM of pt CBG 507, axillary temp 94.7, critical lab value lactic acid 2.3, and possible need for central line access.  Warming blanket applied to patient. 6 units Novolog given per sliding scale orders. MD to order 12 additional units Novolog and 15 units Lantus at 2200. MD not concerned about lactic acid at this time.   2039- EVD placed in right parietal lobe. Given verbal orders from Ronnald Ramp MD (Neurosurgery) to set drain at 10 cm H2O.   2140- Pt is on multiple gtts and will require hypertonic saline infusion and possible an insulin gtt. Three peripheral IV's present at this time. Rollene Rotunda PA (CCM) to insert left triple lumen IJ to improve access.   2338- CBG improved to 261. Per CCM, instructed to follow Novolog sliding scale orders and continue to monitor blood sugar. Pt returned from CT without event. Pt removed from warming blanket due to rising temperature.   0030Curly Shores MD (Neuro) messaged via secure chat about pt's BP parameters. Pt SBP has been 115-mid 120's for majority of shift. Cleviprex off for several hours. Per orders, goal SBP 130-150. No new orders given.   35- CCM notified of CBG 30. Pt given 1 amp D50 per standing orders. CBG rechecked 15 mins later, improved to 96. No new orders given.   0430- CCM notified for Hgb 6.6. Recheck CBC ordered. Hgb 6.4. Per CCM, given orders to transfuse x1 unit RBC.   0528- CBG 41. Another 1 amp D50 given. CBG improved to 78. Per CCM, orders given for D10 infusion at 40 mL/h. Pt restarted on warming blanket due to dropping temperature.   0630- Pt had no UOP for entire shift. Although pt has hx renal failure, mother states that pt urinates at baseline. Bladder scan at 0130 shows 180cc; repeat bladder scan at 0540 shows 281 cc. This RN performed I/O cath at 0645 with 350 cc output.

## 2023-01-28 NOTE — Progress Notes (Signed)
EEG complete - results pending 

## 2023-01-28 NOTE — Progress Notes (Signed)
Initial Nutrition Assessment  DOCUMENTATION CODES:   Not applicable  INTERVENTION:  Will adjust tube feed regimen via OGT: -Initiate Vital 1.5 at 30 mL/hour and advance by 10 mL/hour every 8 hours to goal rate of 50 mL/hour (1200 mL goal daily volume). -Provide PROSource TF20 60 mL daily per tube. -Goal regimen provides: 1880 kcal, 101 grams of protein, 912 mL H2O daily  Continue Rena-vite QHS per tube.  NUTRITION DIAGNOSIS:   Inadequate oral intake related to inability to eat as evidenced by NPO status.  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  2/9: intubated; s/p placement of ventriculostomy drain  Noted tube feeds initiated via OG tube per protocol. Plan is for HD tonight at bedside. Noted pt has had hypoglycemia today and was started on D10-NS. Dry weight per renal note is 62 kg. Per review of chart weights have fluctuated between 57-61 kg since 2022. Current weight is 65.9 kg (145.28 lbs). Will utilize EDW to estimate needs at this time as pt with mild edema per nephrology note and continue to monitor weight trends.   Patient is currently intubated on ventilator support MV: 8 L/min Temp (24hrs), Avg:98.5 F (36.9 C), Min:94.7 F (34.8 C), Max:99.7 F (37.6 C)  Medications reviewed and include: Colace, Novolog 0-6 units every 4 hours, Keppra, levothyroxine, Rena-vite QHS per tube, pantoprazole, Miralax, senna-docusate, D10-NS at 60 mL/hour, fentanyl gtt, propofol gtt now stopped  Labs  reviewed: CBG 29-152  Enteral Access: 18 Fr. OGT placed 2/9; terminates in distal stomach per abdominal x-ray 2/9  TF regimen: Vital High Protein at 40 mL/hour + PROSource TF 60 mL daily was initiated per protocol  UOP: 350 mL previous 24 hours  I/O: +1263.6 mL since admission  NUTRITION - FOCUSED PHYSICAL EXAM:  Unable to complete as RD is working remotely.  Diet Order:   Diet Order             Diet NPO time specified  Diet effective now                  EDUCATION NEEDS:   No education needs have been identified at this time  Skin:  Skin Assessment: Reviewed RN Assessment  Last BM:  Unknown/PTA  Height:   Ht Readings from Last 1 Encounters:  01/27/23 5' 5"$  (1.651 m)   Weight:   Wt Readings from Last 1 Encounters:  01/27/23 65.9 kg   Ideal Body Weight:  56.8 kg  BMI:  Body mass index is 24.18 kg/m.  Estimated Nutritional Needs:   Kcal:  1800-2000  Protein:  90-100 grams  Fluid:  UOP + 1 L  Tyannah Sane Magda Paganini, MS, RD, LDN, CNSC Pager number available on Amion

## 2023-01-28 NOTE — Progress Notes (Addendum)
STROKE TEAM PROGRESS NOTE   INTERVAL HISTORY Her mother is at the bedside.  Mother reports increased seizure activity. Hgb 6.4 -> 1u PRBC. K replaced. Switch D10 and 3% to a D10NS @40ml$ /hr EVD in place, remains intubated.   Vitals:   01/28/23 0630 01/28/23 0632 01/28/23 0657 01/28/23 0700  BP: 127/83 127/83 137/82 137/82  Pulse: 70 69 69 69  Resp: 18 18 18 18  $ Temp: (!) 97.3 F (36.3 C) (!) 97.3 F (36.3 C) 98.5 F (36.9 C) (!) 97.3 F (36.3 C)  TempSrc:  Esophageal Axillary   SpO2: 100% 100% 100% 100%  Weight:      Height:       CBC:  Recent Labs  Lab 01/27/23 1536 01/27/23 1547 01/28/23 0300 01/28/23 0415  WBC 9.1  --  7.7 7.7  NEUTROABS 6.8  --   --   --   HGB 7.5*   < > 6.6* 6.4*  HCT 23.9*   < > 20.1* 19.6*  MCV 93.7  --  89.7 90.3  PLT 124*  --  113* 112*   < > = values in this interval not displayed.   Basic Metabolic Panel:  Recent Labs  Lab 01/27/23 1536 01/27/23 1547 01/27/23 1746 01/27/23 2112 01/28/23 0300  NA 134*   < > 132* 133* 138  138  K 3.7   < > 3.6  --  2.9*  CL 98  --   --   --  101  CO2 24  --   --   --  25  GLUCOSE 361*  --   --   --  61*  BUN 14  --   --   --  18  CREATININE 3.07*  --   --   --  3.83*  CALCIUM 9.2  --   --   --  9.4   < > = values in this interval not displayed.   Lipid Panel:  Recent Labs  Lab 01/28/23 0300  CHOL 247*  TRIG 65  64  HDL 119  CHOLHDL 2.1  VLDL 13  LDLCALC 115*   HgbA1c:  Recent Labs  Lab 01/28/23 0300  HGBA1C 12.0*   Urine Drug Screen: No results for input(s): "LABOPIA", "COCAINSCRNUR", "LABBENZ", "AMPHETMU", "THCU", "LABBARB" in the last 168 hours.  Alcohol Level  Recent Labs  Lab 01/27/23 1833  ETH <10    IMAGING past 24 hours CT HEAD WO CONTRAST  Result Date: 01/28/2023 CLINICAL DATA:  Follow-up examination for hemorrhagic stroke. EXAM: CT HEAD WITHOUT CONTRAST TECHNIQUE: Contiguous axial images were obtained from the base of the skull through the vertex without intravenous  contrast. RADIATION DOSE REDUCTION: This exam was performed according to the departmental dose-optimization program which includes automated exposure control, adjustment of the mA and/or kV according to patient size and/or use of iterative reconstruction technique. COMPARISON:  Prior CT from earlier the same day. FINDINGS: Brain: Intraparenchymal hematoma centered at the anterior left basal ganglia again seen, not significantly changed in size and morphology from prior. Intraventricular extension with blood throughout the ventricular system, similar. There has been interval placement of a right frontal approach ventriculostomy with tip in the right lateral ventricle. Overall ventricular size is similar with persistent mild hydrocephalus. Basilar cisterns remain patent. No more than mild localized left-to-right shift at the level of the hematoma. No new intracranial hemorrhage. No new large vessel territory infarct. No mass lesion or extra-axial fluid collection. Vascular: No abnormal hyperdense vessel. Calcified atherosclerosis present at the skull  base. Skull: Right frontal approach ventriculostomy. Associated soft tissue swelling and emphysema at the right frontal scalp. Calvarium otherwise intact. Sinuses/Orbits: Visualized globes and orbital soft tissues demonstrate no acute finding. Large right maxillary sinus retention cyst noted. Scattered mucosal thickening noted about the left maxillary sinus. Mastoid air cells remain clear. Other: None. IMPRESSION: 1. Interval placement of a right frontal approach ventriculostomy with tip in the right lateral ventricle. Overall ventricular size is similar with persistent mild hydrocephalus. 2. No significant interval change in size and morphology of intraparenchymal hematoma centered at the anterior left basal ganglia with intraventricular extension. 3. No other new acute intracranial abnormality. Electronically Signed   By: Jeannine Boga M.D.   On: 01/28/2023 00:00    DG CHEST PORT 1 VIEW  Result Date: 01/27/2023 CLINICAL DATA:  Central line placement. EXAM: PORTABLE CHEST 1 VIEW COMPARISON:  Chest radiograph dated 01/27/2023. FINDINGS: Endotracheal tube with tip in similar position above the carina. Enteric tube extends below the diaphragm with tip beyond the inferior margin of the image. Interval placement of a left IJ central venous line with tip close to the cavoatrial junction. No pneumothorax. There is cardiomegaly with vascular congestion. No focal consolidation, pleural effusion, or pneumothorax. No acute osseous pathology. IMPRESSION: 1. Interval placement of a left IJ central venous line with tip close to the cavoatrial junction. No pneumothorax. 2. Cardiomegaly with vascular congestion. Electronically Signed   By: Anner Crete M.D.   On: 01/27/2023 22:22   DG Abd Portable 1V  Result Date: 01/27/2023 CLINICAL DATA:  OG tube placement EXAM: PORTABLE ABDOMEN - 1 VIEW COMPARISON:  None Available. FINDINGS: Esophageal tube tip overlies the distal stomach. Upper gas pattern is unremarkable IMPRESSION: Esophageal tube tip overlies the distal stomach. Electronically Signed   By: Donavan Foil M.D.   On: 01/27/2023 19:09   DG Chest Portable 1 View  Result Date: 01/27/2023 CLINICAL DATA:  Altered mental status. EXAM: PORTABLE CHEST 1 VIEW COMPARISON:  Chest radiograph dated 12/16/2022. FINDINGS: Endotracheal tube with tip approximately 3 cm above the carina. Enteric tube extends below the diaphragm with tip beyond the inferior margin of the image. There is mild cardiomegaly with mild central vascular congestion. No focal consolidation, pleural effusion, or pneumothorax. No acute osseous pathology. IMPRESSION: 1. Endotracheal tube above the carina. 2. Mild cardiomegaly with mild central vascular congestion. Electronically Signed   By: Anner Crete M.D.   On: 01/27/2023 18:20   CT ANGIO HEAD NECK W WO CM  Result Date: 01/27/2023 CLINICAL DATA:  Stroke,  hemorrhagic. EXAM: CT ANGIOGRAPHY HEAD AND NECK TECHNIQUE: Multidetector CT imaging of the head and neck was performed using the standard protocol during bolus administration of intravenous contrast. Multiplanar CT image reconstructions and MIPs were obtained to evaluate the vascular anatomy. Carotid stenosis measurements (when applicable) are obtained utilizing NASCET criteria, using the distal internal carotid diameter as the denominator. RADIATION DOSE REDUCTION: This exam was performed according to the departmental dose-optimization program which includes automated exposure control, adjustment of the mA and/or kV according to patient size and/or use of iterative reconstruction technique. CONTRAST:  54m OMNIPAQUE IOHEXOL 350 MG/ML SOLN COMPARISON:  Head CT January 27, 2023. FINDINGS: CTA NECK FINDINGS Aortic arch: Common origin of the innominate and left common carotid artery from the aortic arch. Imaged portion shows no evidence of aneurysm or dissection. No significant stenosis of the major arch vessel origins. Right carotid system: No evidence of dissection, stenosis (50% or greater), or occlusion. Left carotid system: No evidence of  dissection, stenosis (50% or greater), or occlusion. Vertebral arteries: No evidence of dissection, stenosis (50% or greater), or occlusion. Skeleton: Apparent fracture of the right humeral shaft is likely artifactual. Other neck: Negative. 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 Review of the MIP images confirms the above findings CTA HEAD FINDINGS Anterior circulation: No significant stenosis, proximal occlusion, aneurysm, or vascular malformation. Calcified atherosclerotic plaques in the bilateral carotid siphons. Posterior circulation: No significant stenosis, proximal occlusion, aneurysm, or vascular malformation. Venous sinuses: As  permitted by contrast timing, patent. Anatomic variants: None significant. Review of the MIP images confirms the above findings IMPRESSION: 1. No evidence of an aneurysm or vascular malformation to explain known intraventricular hemorrhage. 2. No intracranial large vessel occlusion or significant stenosis. 3. No hemodynamically significant stenosis in the neck. 4. 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 5. Apparent fracture of the right humeral shaft is likely artifactual. Correlate with physical exam and radiographs if clinically appropriate. Electronically Signed   By: Pedro Earls M.D.   On: 01/27/2023 17:05   CT Head Wo Contrast  Result Date: 01/27/2023 CLINICAL DATA:  Mental status change, cause. EXAM: CT HEAD WITHOUT CONTRAST TECHNIQUE: Contiguous axial images were obtained from the base of the skull through the vertex without intravenous contrast. RADIATION DOSE REDUCTION: This exam was performed according to the departmental dose-optimization program which includes automated exposure control, adjustment of the mA and/or kV according to patient size and/or use of iterative reconstruction technique. COMPARISON:  Head CT December 16, 2022. FINDINGS: Brain: A 4.3 x 2.3 x 2.4 cm hematoma within the left lateral ventricle (estimated volume 12 mL). There is red is strip view shin to the right lateral ventricle, third and fourth ventricles. There is mild hydrocephalus with mild prominence of the temporal and occipital horns. No intraparenchymal hemorrhage or ischemic infarct. No midline shift. Vascular: Calcified plaques in the bilateral carotid siphons and intracranial vertebral arteries. No hyperdense vessel. Skull: Normal. Negative for fracture or focal lesion. Sinuses/Orbits: Large mucous retention cyst in the right maxillary sinus. The orbits are maintained.  Other: None. IMPRESSION: A 4.3 x 2.3 x 2.4 cm hematoma within the left lateral ventricle, with intraventricular extension. 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 These results were called by telephone at the time of interpretation on 01/27/2023 at 4:49 pm to provider Sherwood Gambler , who verbally acknowledged these results. Electronically Signed   By: Pedro Earls M.D.   On: 01/27/2023 16:49    PHYSICAL EXAM  Physical Exam  Constitutional: Appears well-developed and well-nourished.   Cardiovascular: Normal rate and regular rhythm.  Respiratory: Mechanically ventilated   Neuro: Mental Status: Intubated, unresponsive not following commands Cranial Nerves: With forced eye opening eyes are disconjugate with mild outward gaze.  Does not blink to visual threat.  Doll's eyes absent, not tracking pupils are equal.  Corneal, cough, gag present.   She is breathing over the vent. Motor/sensory Slight movement of the left lower extremity but no movement of other extremities. Diminished deep tendon reflexes no Babinski    ASSESSMENT/PLAN Ms. Moniyah Canney is a 37 y.o. female with history of ESRD on HD, DM, HTN, seizures, HLD, GERD, hypothyroidism, anxiety and depression who presents from dialysis center  for evaluation of AMS and syncopal episode.   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 CTA head & neck unremarkable 2/10 at 0000- Repeat Head CT- s/p EVD. Overall ventricular size is similar with persistent mild hydrocephalus. Stable hematoma 2D Echo 60-65%, left atrium is moderately dilated LDL 115 HgbA1c 12.0 UDS positive for THC VTE prophylaxis - heparin subq No antithrombotic prior to admission, now on No  antithrombotic.  Therapy recommendations: Pending Disposition: Pending  Acute Hypoxic Respiratory Failure Intubated 2/9 CCM management for ventilator  Goal SpO2 greater than 92%  Hydrocephalus  HTS at 40 -> switch to D10NS  Na 133->138->140 EVD placed 2/9 by NSGY 10cmH2O CSF is still pink tinged, draining 0-21cc/hr  Seizure disorder 09/2021 seizure triggered by hypoglycemia, on Keppra Home meds Keppra 500 mg daily with 221m additional on dialysis days Not sure compliance Reported frequent seizure at home and with HD Now on keppra home dose EEG pending  Diabetes type I Uncontrolled Hypoglycemia On D10 @40ml$ /hr Adjust SSI Start TF Home meds: Insulin HgbA1c 12.0, goal < 7.0 CBGs  Hypertensive emergency Home meds: Norvasc, Cardizem, furosemide, hydralazine, losartan Off cleviprex now Stable BP goal < 160  Hyperlipidemia Home meds pravastatin 20 LDL 115, goal < 70 AST/ALT 63/45 -> pending Hold off statin due to acute ICH and transaminitis Consider resume statin at discharge  Other Stroke Risk Factors Obesity, Body mass index is 24.18 kg/m., BMI >/= 30 associated with increased stroke risk, recommend weight loss, diet and exercise as appropriate  THC abuse, cessation education will be provided Migraine - [facility] Other Active Problems Hypokalemia Replete and repeat BMP, check mag Severe anemia  Hgb 7.5-8.5-6.4 -> 1uPRBC Repeat H/H ESRD with HD MWF HD done 2/9 only received 1 hour her 4 hour session Per her mother she had 5 dialysis sessions last week Nephrology consulted by CCM  Hypothyroidism TSH 201.471 - continue Synthroid Free T4 - 0.77 Anxiety, depression [facility]  Patient seen and examined by NP/APP with MD. MD to update note as needed.   DJanine Ores DNP, FNP-BC [facility] Pager: [phone] ATTENDING NOTE: I reviewed above note and agree with the assessment and plan. Pt  was seen and examined.   37year old female with history of hypertension, hyperlipidemia, diabetes, seizure disorder on Keppra, ESRD on HD, anxiety, depression and migraine admitted for altered mental status during HD, significance and elevated BP.  CT showed left BG/CR ICH with IVH and mild hydrocephalus.  CT head and neck unremarkable.  Patient was intubated.  Repeat CT showed stable hematoma and status post EVD.  EF 60 to 65%, UDS positive for THC, LDL 115, A1c 12.0.  AST/ALT 63/45.  Creatinine 3.07, sodium 132-> 140.  Hemoglobin 7.5-8.2-6.4, received 1 unit PRBC.  On exam, mom at bedside.  Patient intubated on sedation, eyes closed, not open on voice, not following commands. With forced eye opening, eyes disconjugated with b/l eyes mild outward gaze, not blinking to visual threat, doll's eyes absent, not tracking, PERRL. Corneal reflex present, gag and cough present. Breathing over the vent.  Facial symmetry not able to test due to ET tube.  Tongue protrusion not cooperative. On pain stimulation, slight movement of LLE but no movement of other  extremities. DTR diminished and no babinski. Sensation, coordination and gait not tested.   Etiology for patient ICH and IVH likely due to hypertensive emergency in the setting of ESRD.  Patient also has fluctuating glucose levels from hyperglycemia to hypoglycemia, currently on tube feeding and D10.  Was on 3% saline now changed to normal saline given no significant cerebral edema.  Status post EVD due to mild hydrocephalus, currently EVD patent.  Continue Keppra, will check EEG.  Vent management per CCM.  Follow-up LFT.  BP goal less than 160.   For detailed assessment and plan, please refer to above/below as I have made changes wherever appropriate.   Rosalin Hawking, MD PhD Stroke Neurology 01/28/2023 5:20 PM  This patient is critically ill due to [place] and IVH, hydrocephalus status post EVD, ESRD, hypo and hyperglycemia, respiratory failure and at significant  risk of neurological worsening, death form hematoma expansion, cerebral edema, brain herniation, obstructive hydrocephalus, respite failure, DKA, hypoglycemia. This patient's care requires constant monitoring of vital signs, hemodynamics, respiratory and cardiac monitoring, review of multiple databases, neurological assessment, discussion with family, other specialists and medical decision making of high complexity. I spent 40 minutes of neurocritical care time in the care of this patient. I had long discussion with mom at bedside, updated pt current condition, treatment plan and potential prognosis, and answered all the questions.  She expressed understanding and appreciation.  I also discussed with Dr. Halford Chessman CCM.     To contact Stroke Continuity provider, please refer to [email or web address] After hours, contact General Neurology

## 2023-01-28 NOTE — Plan of Care (Signed)
Same day progress note  Case d/w Dr. Ronnald Ramp re: EVD.  Exam and Imaging findings reviewed with him over phone. He has agreed to place EVD for the developing hydrocephalus. [place] post EVD. Appreciate neurosurgical consult and EVD placement in the care of this patient.  -- Amie Portland, MD Neurologist  Late entry note

## 2023-01-28 NOTE — Progress Notes (Signed)
PT Cancellation Note  Patient Details Name: Angela [name] MRN: [id number] DOB: 10-14-1986   Cancelled Treatment:    Reason Eval/Treat Not Completed: Active bedrest order (Bedrest 2/9 x24 hours starting at [id number]).  Wyona Almas, PT, DPT Acute Rehabilitation Services Office [phone]    Deno Etienne 01/28/2023, 7:32 AM

## 2023-01-28 NOTE — Procedures (Signed)
Patient Name: Monique Jesperson  MRN: [id number]  Epilepsy Attending: Lora Havens  Referring Physician/Provider: Rosalin Hawking, MD  Date: 01/28/2023 Duration: 23.22 mins  Patient history: 37yo F with h/o seizures now with L BG ICH. EEG to evaluate for seizure  Level of alertness: comatose  AEDs during EEG study: LEV, propofol  Technical aspects: This EEG study was done with scalp electrodes positioned according to the 10-20 International system of electrode placement. Electrical activity was reviewed with band pass filter of 1-70Hz$ , sensitivity of 7 uV/mm, display speed of 73m/sec with a 60Hz$  notched filter applied as appropriate. EEG data were recorded continuously and digitally stored.  Video monitoring was available and reviewed as appropriate.  Description: EEG showed continuous generalized 3 to 6 Hz theta-delta slowing. Hyperventilation and photic stimulation were not performed.     ABNORMALITY - Continuous slow, generalized  IMPRESSION: This study is suggestive of moderate to severe diffuse encephalopathy, nonspecific etiology. No seizures or epileptiform discharges were seen throughout the recording.  Elliannah Wayment OBarbra Sarks

## 2023-01-28 NOTE — Progress Notes (Signed)
Patient's SBP's occasionally < 130-150 goal. Most recent BP 118/74 MAP 89  Dr. Erlinda Hong aware, may liberalize SBP 100-160 as long as there are no neuro changes noted.

## 2023-01-28 NOTE — Progress Notes (Signed)
Plan for HD session tonight. Informed consent obtained by this RN from mother at bedside. Placed in shadow chart.

## 2023-01-28 NOTE — Progress Notes (Signed)
SLP Cancellation Note  Patient Details Name: Angela [name] MRN: [id number] DOB: 03-05-1986   Cancelled treatment:       Reason Eval/Treat Not Completed: Per chart/RN, pt remains on the vent. SLP to continue f/u.    Ellwood Dense, [place], [place] Acute Rehabilitation Services Office Number: [phone]  Acie Fredrickson 01/28/2023, 8:33 AM

## 2023-01-28 NOTE — Progress Notes (Signed)
CBG via central line 65. Tube feeds continue as ordered, d10NS infusing at 40cc/hr. 3 dextrose amps given this shift. Dr. Halford Chessman aware, increase D10NS rate to 60cc/hr.

## 2023-01-28 NOTE — Progress Notes (Addendum)
CRITICAL VALUE STICKER  CRITICAL VALUE: CBG 29   MD NOTIFIED: Dr. Halford Chessman CCM  TIME OF NOTIFICATION: 0820  RESPONSE:  D10@40$  infusing as ordered. Dextrose amp given per policy. Repeat CBG in 15 minutes   8:39 AM Repeat CBG 95  1000 CBG 48 Amp given TF started CCM aware Repeat 15 min check 124   *All CBG values obtained via central line.*

## 2023-01-29 DIAGNOSIS — I161 Hypertensive emergency: Secondary | ICD-10-CM

## 2023-01-29 DIAGNOSIS — N186 End stage renal disease: Secondary | ICD-10-CM

## 2023-01-29 DIAGNOSIS — I619 Nontraumatic intracerebral hemorrhage, unspecified: Secondary | ICD-10-CM | POA: Diagnosis not present

## 2023-01-29 DIAGNOSIS — J988 Other specified respiratory disorders: Secondary | ICD-10-CM | POA: Diagnosis not present

## 2023-01-29 LAB — BASIC METABOLIC PANEL WITH GFR
Anion gap: 12 (ref 5–15)
BUN: 23 mg/dL — ABNORMAL HIGH (ref 6–20)
CO2: 23 mmol/L (ref 22–32)
Calcium: 9.7 mg/dL (ref 8.9–10.3)
Chloride: 106 mmol/L (ref 98–111)
Creatinine, Ser: 5.21 mg/dL — ABNORMAL HIGH (ref 0.44–1.00)
GFR, Estimated: 10 mL/min — ABNORMAL LOW
Glucose, Bld: 170 mg/dL — ABNORMAL HIGH (ref 70–99)
Potassium: 4.4 mmol/L (ref 3.5–5.1)
Sodium: 141 mmol/L (ref 135–145)

## 2023-01-29 LAB — CBC
HCT: 30.4 % — ABNORMAL LOW (ref 36.0–46.0)
Hemoglobin: 10 g/dL — ABNORMAL LOW (ref 12.0–15.0)
MCH: 29.5 pg (ref 26.0–34.0)
MCHC: 32.9 g/dL (ref 30.0–36.0)
MCV: 89.7 fL (ref 80.0–100.0)
Platelets: 129 10*3/uL — ABNORMAL LOW (ref 150–400)
RBC: 3.39 MIL/uL — ABNORMAL LOW (ref 3.87–5.11)
RDW: 15 % (ref 11.5–15.5)
WBC: 13 10*3/uL — ABNORMAL HIGH (ref 4.0–10.5)
nRBC: 0 % (ref 0.0–0.2)

## 2023-01-29 LAB — HEPATIC FUNCTION PANEL
ALT: 58 U/L — ABNORMAL HIGH (ref 0–44)
AST: 115 U/L — ABNORMAL HIGH (ref 15–41)
Albumin: 3 g/dL — ABNORMAL LOW (ref 3.5–5.0)
Alkaline Phosphatase: 174 U/L — ABNORMAL HIGH (ref 38–126)
Bilirubin, Direct: <0.1 mg/dL (ref 0.0–0.2)
Total Bilirubin: 0.5 mg/dL (ref 0.3–1.2)
Total Protein: 6.5 g/dL (ref 6.5–8.1)

## 2023-01-29 LAB — GLUCOSE, CAPILLARY
Glucose-Capillary: 163 mg/dL — ABNORMAL HIGH (ref 70–99)
Glucose-Capillary: 198 mg/dL — ABNORMAL HIGH (ref 70–99)
Glucose-Capillary: 238 mg/dL — ABNORMAL HIGH (ref 70–99)
Glucose-Capillary: 241 mg/dL — ABNORMAL HIGH (ref 70–99)
Glucose-Capillary: 294 mg/dL — ABNORMAL HIGH (ref 70–99)
Glucose-Capillary: 354 mg/dL — ABNORMAL HIGH (ref 70–99)

## 2023-01-29 LAB — BPAM RBC
Blood Product Expiration Date: 202403092359
ISSUE DATE / TIME: 202402100612
Unit Type and Rh: 5100

## 2023-01-29 LAB — TYPE AND SCREEN
ABO/RH(D): O POS
Antibody Screen: NEGATIVE
Unit division: 0

## 2023-01-29 LAB — PHOSPHORUS
Phosphorus: 4.7 mg/dL — ABNORMAL HIGH (ref 2.5–4.6)
Phosphorus: 4.6 mg/dL (ref 2.5–4.6)

## 2023-01-29 LAB — MAGNESIUM
Magnesium: 2 mg/dL (ref 1.7–2.4)
Magnesium: 1.8 mg/dL (ref 1.7–2.4)

## 2023-01-29 MED ORDER — HYDRALAZINE HCL 50 MG PO TABS: 50.0000 mg | ORAL_TABLET | Freq: Three times a day (TID) | ORAL | Status: DC

## 2023-01-29 MED ORDER — AMLODIPINE BESYLATE 10 MG PO TABS
10.0000 mg | ORAL_TABLET | Freq: Every day | ORAL | Status: DC
  Administered 2023-01-30 – 2023-02-02 (×3): 10 mg
  Filled 2023-01-29 (×4): qty 1

## 2023-01-29 MED ORDER — VITAL 1.5 CAL PO LIQD
1000.0000 mL | ORAL | Status: DC
  Administered 2023-01-29: 1000 mL

## 2023-01-29 MED ORDER — ONDANSETRON HCL 4 MG/2ML IJ SOLN
4.0000 mg | Freq: Four times a day (QID) | INTRAMUSCULAR | Status: DC | PRN
  Administered 2023-01-29 – 2023-01-31 (×2): 4 mg via INTRAVENOUS
  Filled 2023-01-29 (×2): qty 2

## 2023-01-29 MED ORDER — VITAL 1.5 CAL PO LIQD
1000.0000 mL | ORAL | Status: DC
  Administered 2023-01-29 (×2): 1000 mL

## 2023-01-29 MED ORDER — CHLORHEXIDINE GLUCONATE CLOTH 2 % EX PADS: 6.0000 | MEDICATED_PAD | Freq: Every day | CUTANEOUS | Status: DC

## 2023-01-29 NOTE — Progress Notes (Signed)
OT Cancellation Note  Patient Details Name: Zandaya Fournet MRN: [id number] DOB: 16-Dec-1986   Cancelled Treatment:    Reason Eval/Treat Not Completed: Medical issues which prohibited therapy (OT evaluation to f/u when appropriate.)  Elliot Cousin 01/29/2023, 10:24 AM

## 2023-01-29 NOTE — Progress Notes (Addendum)
SLP Cancellation Note  Patient Details Name: Vernida Pownell MRN: [id number] DOB: May 12, 1986   Cancelled treatment:        Orders for cognitive linguistic evaluation received and appreciated.  Per chart review, pt remains intubated at this time and required sedation overnight 2/2 agitation.  SLP will reattempt when pt is medically ready for assessment.  Consider swallow evaluation orders post extubation if indicated.   Celedonio Savage, MA, [place] Office: [phone] 01/29/2023, 8:42 AM

## 2023-01-29 NOTE — Plan of Care (Signed)
Problem: Intracerebral Hemorrhage Tissue Perfusion: Goal: Complications of Intracerebral Hemorrhage will be minimized Outcome: Progressing   Problem: Health Behavior/Discharge Planning: Goal: Goals will be collaboratively established with patient/family Outcome: Progressing   Problem: Nutrition: Goal: Dietary intake will improve Outcome: Progressing   Problem: Coping: Goal: Ability to adjust to condition or change in health will improve Outcome: Progressing   Problem: Metabolic: Goal: Ability to maintain appropriate glucose levels will improve Outcome: Progressing   Problem: Nutritional: Goal: Maintenance of adequate nutrition will improve Outcome: Progressing Goal: Progress toward achieving an optimal weight will improve Outcome: Progressing   Problem: Skin Integrity: Goal: Risk for impaired skin integrity will decrease Outcome: Progressing   Problem: Tissue Perfusion: Goal: Adequacy of tissue perfusion will improve Outcome: Progressing   Problem: Activity: Goal: Ability to tolerate increased activity will improve Outcome: Progressing   Problem: Respiratory: Goal: Ability to maintain a clear airway and adequate ventilation will improve Outcome: Progressing   Problem: Safety: Goal: Non-violent Restraint(s) Outcome: Progressing   Problem: Clinical Measurements: Goal: Will remain free from infection Outcome: Progressing Goal: Diagnostic test results will improve Outcome: Progressing Goal: Respiratory complications will improve Outcome: Progressing Goal: Cardiovascular complication will be avoided Outcome: Progressing   Problem: Activity: Goal: Risk for activity intolerance will decrease Outcome: Progressing   Problem: Nutrition: Goal: Adequate nutrition will be maintained Outcome: Progressing   Problem: Coping: Goal: Level of anxiety will decrease Outcome: Progressing   Problem: Pain Managment: Goal: General experience of comfort will  improve Outcome: Progressing   Problem: Safety: Goal: Ability to remain free from injury will improve Outcome: Progressing   Problem: Skin Integrity: Goal: Risk for impaired skin integrity will decrease Outcome: Progressing   Problem: Education: Goal: Knowledge of disease or condition will improve Outcome: Not Progressing Goal: Knowledge of secondary prevention will improve (MUST DOCUMENT ALL) Outcome: Not Progressing Goal: Knowledge of patient specific risk factors will improve [place] N/A or DELETE if not current risk factor) Outcome: Not Progressing   Problem: Coping: Goal: Will verbalize positive feelings about self Outcome: Not Progressing Goal: Will identify appropriate support needs Outcome: Not Progressing   Problem: Health Behavior/Discharge Planning: Goal: Ability to manage health-related needs will improve Outcome: Not Progressing   Problem: Self-Care: Goal: Ability to participate in self-care as condition permits will improve Outcome: Not Progressing Goal: Verbalization of feelings and concerns over difficulty with self-care will improve Outcome: Not Progressing Goal: Ability to communicate needs accurately will improve Outcome: Not Progressing   Problem: Nutrition: Goal: Risk of aspiration will decrease Outcome: Not Progressing   Problem: Education: Goal: Ability to describe self-care measures that may prevent or decrease complications (Diabetes Survival Skills Education) will improve Outcome: Not Progressing Goal: Individualized Educational Video(s) Outcome: Not Progressing   Problem: Fluid Volume: Goal: Ability to maintain a balanced intake and output will improve Outcome: Not Progressing   Problem: Health Behavior/Discharge Planning: Goal: Ability to identify and utilize available resources and services will improve Outcome: Not Progressing Goal: Ability to manage health-related needs will improve Outcome: Not Progressing   Problem: Role  Relationship: Goal: Method of communication will improve Outcome: Not Progressing   Problem: Education: Goal: Knowledge of General Education information will improve Description: Including pain rating scale, medication(s)/side effects and non-pharmacologic comfort measures Outcome: Not Progressing   Problem: Health Behavior/Discharge Planning: Goal: Ability to manage health-related needs will improve Outcome: Not Progressing   Problem: Clinical Measurements: Goal: Ability to maintain clinical measurements within normal limits will improve Outcome: Not Progressing   Problem: Elimination: Goal: Will not  experience complications related to bowel motility Outcome: Not Progressing Goal: Will not experience complications related to urinary retention Outcome: Not Progressing

## 2023-01-29 NOTE — Progress Notes (Signed)
  Informed consent signed and in chart.  [place] duration:3 Patient tolerated well.   Hand-off given to patient's nurse.  Access used: fistula Access issues: none  Total UF removed: 2800 ml Medication(s) given: none      01/29/23 0745  Vitals  Temp 98.4 F (36.9 C)  BP (!) 154/79  MAP (mmHg) 97  BP Location Right Arm  BP Method Automatic  Patient Position (if appropriate) Lying  Pulse Rate 87  Pulse Rate Source Monitor  ECG Heart Rate 91  Resp 18  Oxygen Therapy  SpO2 94 %  O2 Device Ventilator  Post Treatment  Dialyzer Clearance Lightly streaked  Duration of HD Treatment -hour(s) 3 hour(s)  Hemodialysis Intake (mL) 0 mL  Liters Processed 71.4  Fluid Removed (mL) 2800 mL  Tolerated HD Treatment Yes  Post-Hemodialysis Comments HD tx achieved as expected, tolerated well, pt has been sedated.  AVG/AVF Arterial Site Held (minutes) 10 minutes  AVG/AVF Venous Site Held (minutes) 10 minutes  Note  Observations pt is in bed, sedated  Fistula / Graft Left Upper arm Arteriovenous fistula  No placement date or time found.   Placed prior to admission: Yes  Orientation: Left  Access Location: Upper arm  Access Type: Arteriovenous fistula  Site Condition No complications  Fistula / Graft Assessment Present;Thrill;Bruit  Status Deaccessed  Drainage Description None

## 2023-01-29 NOTE — Plan of Care (Signed)
Problem: Intracerebral Hemorrhage Tissue Perfusion: Goal: Complications of Intracerebral Hemorrhage will be minimized Outcome: Progressing   Problem: Health Behavior/Discharge Planning: Goal: Goals will be collaboratively established with patient/family Outcome: Progressing   Problem: Nutrition: Goal: Dietary intake will improve Outcome: Progressing   Problem: Nutritional: Goal: Maintenance of adequate nutrition will improve Outcome: Progressing Goal: Progress toward achieving an optimal weight will improve Outcome: Progressing   Problem: Skin Integrity: Goal: Risk for impaired skin integrity will decrease Outcome: Progressing   Problem: Tissue Perfusion: Goal: Adequacy of tissue perfusion will improve Outcome: Progressing   Problem: Respiratory: Goal: Ability to maintain a clear airway and adequate ventilation will improve Outcome: Progressing   Problem: Safety: Goal: Non-violent Restraint(s) Outcome: Progressing   Problem: Clinical Measurements: Goal: Will remain free from infection Outcome: Progressing Goal: Diagnostic test results will improve Outcome: Progressing Goal: Respiratory complications will improve Outcome: Progressing Goal: Cardiovascular complication will be avoided Outcome: Progressing   Problem: Activity: Goal: Risk for activity intolerance will decrease Outcome: Progressing   Problem: Nutrition: Goal: Adequate nutrition will be maintained Outcome: Progressing   Problem: Pain Managment: Goal: General experience of comfort will improve Outcome: Progressing   Problem: Skin Integrity: Goal: Risk for impaired skin integrity will decrease Outcome: Progressing   Problem: Education: Goal: Knowledge of disease or condition will improve Outcome: Not Progressing Goal: Knowledge of secondary prevention will improve (MUST DOCUMENT ALL) Outcome: Not Progressing Goal: Knowledge of patient specific risk factors will improve [place] N/A or DELETE if  not current risk factor) Outcome: Not Progressing   Problem: Coping: Goal: Will verbalize positive feelings about self Outcome: Not Progressing Goal: Will identify appropriate support needs Outcome: Not Progressing   Problem: Health Behavior/Discharge Planning: Goal: Ability to manage health-related needs will improve Outcome: Not Progressing   Problem: Self-Care: Goal: Ability to participate in self-care as condition permits will improve Outcome: Not Progressing Goal: Verbalization of feelings and concerns over difficulty with self-care will improve Outcome: Not Progressing Goal: Ability to communicate needs accurately will improve Outcome: Not Progressing   Problem: Nutrition: Goal: Risk of aspiration will decrease Outcome: Not Progressing   Problem: Education: Goal: Ability to describe self-care measures that may prevent or decrease complications (Diabetes Survival Skills Education) will improve Outcome: Not Progressing Goal: Individualized Educational Video(s) Outcome: Not Progressing   Problem: Coping: Goal: Ability to adjust to condition or change in health will improve Outcome: Not Progressing   Problem: Fluid Volume: Goal: Ability to maintain a balanced intake and output will improve Outcome: Not Progressing   Problem: Health Behavior/Discharge Planning: Goal: Ability to identify and utilize available resources and services will improve Outcome: Not Progressing Goal: Ability to manage health-related needs will improve Outcome: Not Progressing   Problem: Metabolic: Goal: Ability to maintain appropriate glucose levels will improve Outcome: Not Progressing   Problem: Activity: Goal: Ability to tolerate increased activity will improve Outcome: Not Progressing   Problem: Role Relationship: Goal: Method of communication will improve Outcome: Not Progressing   Problem: Education: Goal: Knowledge of General Education information will improve Description:  Including pain rating scale, medication(s)/side effects and non-pharmacologic comfort measures Outcome: Not Progressing   Problem: Health Behavior/Discharge Planning: Goal: Ability to manage health-related needs will improve Outcome: Not Progressing   Problem: Clinical Measurements: Goal: Ability to maintain clinical measurements within normal limits will improve Outcome: Not Progressing   Problem: Coping: Goal: Level of anxiety will decrease Outcome: Not Progressing   Problem: Elimination: Goal: Will not experience complications related to bowel motility Outcome: Not Progressing Goal: Will  not experience complications related to urinary retention Outcome: Not Progressing   Problem: Safety: Goal: Ability to remain free from injury will improve Outcome: Not Progressing

## 2023-01-29 NOTE — Progress Notes (Signed)
NEUROSURGERY PROGRESS NOTE  No acute events overnight. EVD continues to drain well. She will FC intermittently. Sedation was weaned down overnight  Temp:  [97.7 F (36.5 C)-99.7 F (37.6 C)] 98.4 F (36.9 C) (02/11 0845) Pulse Rate:  [71-99] 88 (02/11 0845) Resp:  [2-19] 18 (02/11 0845) BP: (112-196)/(73-101) 138/73 (02/11 0845) SpO2:  [94 %-100 %] 98 % (02/11 0845) FiO2 (%):  [40 %] 40 % (02/11 0746)  Plan: Continue EVD at 10H20.   Eleonore Chiquito, NP 01/29/2023 8:55 AM

## 2023-01-29 NOTE — Progress Notes (Addendum)
STROKE TEAM PROGRESS NOTE   INTERVAL HISTORY Her mother is at the bedside. She is on Prop 2 58mg and Fentanyl 1074m IV. Cleviprex has been turned off. Will intermittently follow commands  WBC 13.0, afebrile will continue to monitor. EVD in place, remains intubated. Will check CT head in the am.   Vitals:   01/29/23 1145 01/29/23 1200 01/29/23 1300 01/29/23 1400  BP: (!) 151/87 (!) 146/88 (!) 141/90 (!) 146/91  Pulse: 85 84 85 87  Resp: 18 18 19 18  $ Temp: 98.4 F (36.9 C) 98.4 F (36.9 C) 98.4 F (36.9 C) 98.6 F (37 C)  TempSrc:      SpO2: 96% 97% 98% 98%  Weight:      Height:       CBC:  Recent Labs  Lab 01/27/23 1536 01/27/23 1547 01/28/23 0415 01/28/23 1509 01/29/23 0342  WBC 9.1   < > 7.7  --  13.0*  NEUTROABS 6.8  --   --   --   --   HGB 7.5*   < > 6.4* 8.6* 10.0*  HCT 23.9*   < > 19.6* 26.7* 30.4*  MCV 93.7   < > 90.3  --  89.7  PLT 124*   < > 112*  --  129*   < > = values in this interval not displayed.    Basic Metabolic Panel:  Recent Labs  Lab 01/28/23 1509 01/28/23 1630 01/29/23 0342  NA 140  --  141  K 3.5  --  4.4  CL 105  --  106  CO2 25  --  23  GLUCOSE 64*  --  170*  BUN 18  --  23*  CREATININE 4.62*  --  5.21*  CALCIUM 9.4  --  9.7  MG  --  2.0 2.0  PHOS  --  4.9* 4.7*    Lipid Panel:  Recent Labs  Lab 01/28/23 0300  CHOL 247*  TRIG 65  64  HDL 119  CHOLHDL 2.1  VLDL 13  LDLCALC 115*    HgbA1c:  Recent Labs  Lab 01/28/23 0300  HGBA1C 12.0*    Urine Drug Screen:  Recent Labs  Lab 01/28/23 0715  LABOPIA NONE DETECTED  COCAINSCRNUR NONE DETECTED  LABBENZ NONE DETECTED  AMPHETMU NONE DETECTED  THCU POSITIVE*  LABBARB NONE DETECTED    Alcohol Level  Recent Labs  Lab 01/27/23 1833  ETH <10     IMAGING past 24 hours EEG adult  Result Date: 01/28/2023 YaLora HavensMD     01/28/2023  7:28 PM Patient Name: Angela ZoggRN: [id number] Attending: PrLora Havenseferring Physician/Provider: XuRosalin HawkingMD Date: 01/28/2023 Duration: 23.22 mins Patient history: 37yo with h/o seizures now with L BG ICH. EEG to evaluate for seizure Level of alertness: comatose AEDs during EEG study: LEV, propofol Technical aspects: This EEG study was done with scalp electrodes positioned according to the 10-20 International system of electrode placement. Electrical activity was reviewed with band pass filter of 1-70Hz$ , sensitivity of 7 uV/mm, display speed of 3041mec with a 60Hz$  notched filter applied as appropriate. EEG data were recorded continuously and digitally stored.  Video monitoring was available and reviewed as appropriate. Description: EEG showed continuous generalized 3 to 6 Hz theta-delta slowing. Hyperventilation and photic stimulation were not performed.   ABNORMALITY - Continuous slow, generalized IMPRESSION: This study is suggestive of moderate to severe diffuse encephalopathy, nonspecific etiology. No seizures or epileptiform discharges were seen throughout the recording.  [place]    PHYSICAL EXAM  Physical Exam  Constitutional: Appears well-developed and well-nourished.   Cardiovascular: Normal rate and regular rhythm.  Respiratory: Mechanically ventilated   Neuro: Mental Status: Intubated, unresponsive not following commands Cranial Nerves: With forced eye opening eyes are disconjugate with mild outward gaze.  Does not blink to visual threat.  Doll's eyes absent, not tracking pupils are equal.  Corneal, cough, gag present.   She is breathing over the vent. Motor/sensory No movement of right upper extremity, left arm some purposeful movement  horizontal movement of bilateral lowers Diminished deep tendon reflexes no Babinski    ASSESSMENT/PLAN Angela [name] is a 37 y.o. female with history of ESRD on HD, DM, HTN, seizures, HLD, GERD, hypothyroidism, anxiety and depression who presents from dialysis center for evaluation of AMS and syncopal episode.   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 CTA head & neck unremarkable 2/10 - Repeat Head CT- s/p EVD. Overall ventricular size is similar with persistent mild hydrocephalus. Stable hematoma CT head pending in am 2D Echo 60-65%, left atrium is moderately dilated LDL 115 HgbA1c 12.0 UDS positive for THC VTE prophylaxis - heparin subq No antithrombotic prior to admission, now on No antithrombotic.  Therapy recommendations: Pending Disposition: Pending  Acute Hypoxic Respiratory Failure Intubated 2/9 CCM management for ventilator  On prop and fentanyl gtts Goal SpO2 greater than 92%  Hydrocephalus  HTS at 40 -> switch to D10NS -> off  Na 133->138->140->141 EVD placed 2/9 by NSGY 10cmH2O->17cmH2O CT repeat in am  Seizure disorder 09/2021 seizure triggered by hypoglycemia, on Keppra Home meds Keppra 500 mg BID (changed from home dose of daily) daily with 267m additional on dialysis days Not sure compliance Reported frequent seizure at home and with HD EEG 2/10:  Continuous slow, generalized, no seizures   Diabetes type I Uncontrolled Hypoglycemia  D10 d/c'd  SSI TF's restarted after having vomiting overnight  Home meds: Insulin HgbA1c 12.0, goal < 7.0 CBGs  Hypertensive emergency Home meds: Norvasc, Cardizem, furosemide, hydralazine, losartan Cleviprex turned off this am Resume home norvasc 10 Stable now BP goal < 160  Hyperlipidemia Home meds pravastatin 20 LDL 115, goal < 70 AST/ALT 63/45 -> 115/58 Hold off statin due to acute ICH and transaminitis Consider resume statin at discharge  Other Stroke Risk Factors Obesity, Body mass index is 24.18 kg/m., BMI >/= 30 associated with increased stroke risk, recommend weight loss, diet and exercise as appropriate   THC abuse, cessation education will be provided Mig[facility] Other Active Problems Hypokalemia, resolved  Replete and repeat BMP, check mag K 4.4 Severe anemia  Hgb 7.5-8.5-6.4 -> 1uPRBC 2/10, ->10.0 Repeat H/H ESRD with HD MWF HD done 2/9 only received 1 hour her 4 hour session Per her mother she had 5 dialysis sessions last week Received HD yesterday 2/10 Nephrology consulted by CCM  Hypothyroidism TSH 201.471 - continue Synthroid Free T4 - 0.77 Anxiety, depression BuSpar, Lexapro  Hospital day # 2  Patient seen and examined by NP/APP with MD. MD to update note as needed.   DBeulah GandyDNP, ACNPC-AG  [facility]  ATTENDING NOTE: I reviewed above note and agree with the assessment and plan. Pt was seen and examined.  Patient mom at bedside.  Patient still intubated, lying in bed, on sedation, not open eyes on voice, not following commands.  However per RN, once sedation was lightened, she was able to follow some simple commands, trying to sit up.  She did have vomiting episode earlier today, tube feeding was on hold and he was continued on D10 normal saline.  However tube feeding was able to be resumed later in the morning.  BP stable, off Cleviprex, will put on home amlodipine.  Neurosurgery on board for EVD, increased pressure to 17cmH2O today, will repeat CT in a.m.  Ventilation support per CCM. I had long discussion with mom at bedside, updated pt current condition, treatment plan and potential prognosis, and answered all the questions.  She expressed understanding and appreciation.   For detailed assessment and plan, please refer to above/below as I have made changes wherever appropriate.   Rosalin Hawking, MD PhD Stroke Neurology 01/29/2023 6:06 PM  This patient is critically ill due to [place] and IVH, hydrocephalus status post EVD, ESRD, hypo and hyperglycemia, respiratory failure and at significant risk of neurological worsening,  death form hematoma expansion, cerebral edema, brain herniation, obstructive hydrocephalus, respite failure, DKA, hypoglycemia. This patient's care requires constant monitoring of vital signs, hemodynamics, respiratory and cardiac monitoring, review of multiple databases, neurological assessment, discussion with family, other specialists and medical decision making of high complexity. I spent 35 minutes of neurocritical care time in the care of this patient.    To contact Stroke Continuity provider, please refer to [email or web address] After hours, contact General Neurology

## 2023-01-29 NOTE — Progress Notes (Signed)
[facility] Progress Note   Subjective:   Tolerated HD yesterday, 2.8L UF. Mother at bedside, reports she was agitated when pain meds were reduced and was able to give a thumbs up earlier today. Says she was doing very well with meds and fluid restrictions for awhile but may have gotten off track lately.   Objective Vitals:   01/29/23 0730 01/29/23 0745 01/29/23 0746 01/29/23 0800  BP: (!) 154/79 (!) 154/79 (!) 146/77 (!) 149/75  Pulse: 87 87 90 89  Resp: 18 18 18 11  $ Temp: 98.4 F (36.9 C) 98.4 F (36.9 C) 98.4 F (36.9 C) 98.6 F (37 C)  TempSrc:      SpO2: 94% 94% 97% 97%  Weight:      Height:       Physical Exam General: Sedated, orally intubated Heart: RRR, no murmurs, rubs or gallops Lungs: CTA bilaterally. Orally intubated Abdomen: Soft, non-distended ,+BS Extremities: trace pedal edema bilaterally Dialysis Access:  LUE AVF +bruit  Additional Objective Labs: Basic Metabolic Panel: Recent Labs  Lab 01/28/23 0300 01/28/23 0936 01/28/23 1509 01/28/23 1630 01/29/23 0342  NA 138  138 140 140  --  141  K 2.9*  --  3.5  --  4.4  CL 101  --  105  --  106  CO2 25  --  25  --  23  GLUCOSE 61*  --  64*  --  170*  BUN 18  --  18  --  23*  CREATININE 3.83*  --  4.62*  --  5.21*  CALCIUM 9.4  --  9.4  --  9.7  PHOS  --  4.8*  --  4.9* 4.7*   Liver Function Tests: Recent Labs  Lab 01/27/23 1536 01/29/23 0342  AST 63* 115*  ALT 45* 58*  ALKPHOS 167* 174*  BILITOT 0.6 0.5  PROT 6.7 6.5  ALBUMIN 3.5 3.0*   No results for input(s): "LIPASE", "AMYLASE" in the last 168 hours. CBC: Recent Labs  Lab 01/27/23 1536 01/27/23 1547 01/28/23 0300 01/28/23 0415 01/28/23 1509 01/29/23 0342  WBC 9.1  --  7.7 7.7  --  13.0*  NEUTROABS 6.8  --   --   --   --   --   HGB 7.5*   < > 6.6* 6.4* 8.6* 10.0*  HCT 23.9*   < > 20.1* 19.6* 26.7* 30.4*  MCV 93.7  --  89.7 90.3  --  89.7  PLT 124*  --  113* 112*  --  129*   < > = values in this interval not  displayed.   Blood Culture    Component Value Date/Time   SDES BLOOD BLOOD LEFT HAND 01/27/2023 1836   SPECREQUEST  01/27/2023 1836    BOTTLES DRAWN AEROBIC AND ANAEROBIC Blood Culture adequate volume   CULT  01/27/2023 1836    NO GROWTH < 12 HOURS Performed at [facility] [address], [place], [place] [address]    REPTSTATUS PENDING 01/27/2023 1836    Cardiac Enzymes: No results for input(s): "CKTOTAL", "CKMB", "CKMBINDEX", "TROPONINI" in the last 168 hours. CBG: Recent Labs  Lab 01/28/23 1706 01/28/23 1943 01/28/23 2315 01/29/23 0341 01/29/23 0748  GLUCAP 106* 147* 281* 163* 294*   Iron Studies: No results for input(s): "IRON", "TIBC", "TRANSFERRIN", "FERRITIN" in the last 72 hours. @lablastinr3$ @ Studies/Results: EEG adult  Result Date: 01/28/2023 Lora Havens, MD     01/28/2023  7:28 PM Patient Name: Angela [name] MRN: [id number] Epilepsy  Attending: Lora Havens Referring Physician/Provider: Rosalin Hawking, MD Date: 01/28/2023 Duration: 23.22 mins Patient history: 37yo F with h/o seizures now with L BG ICH. EEG to evaluate for seizure Level of alertness: comatose AEDs during EEG study: LEV, propofol Technical aspects: This EEG study was done with scalp electrodes positioned according to the 10-20 International system of electrode placement. Electrical activity was reviewed with band pass filter of 1-70Hz$ , sensitivity of 7 uV/mm, display speed of 72m/sec with a 60Hz$  notched filter applied as appropriate. EEG data were recorded continuously and digitally stored.  Video monitoring was available and reviewed as appropriate. Description: EEG showed continuous generalized 3 to 6 Hz theta-delta slowing. Hyperventilation and photic stimulation were not performed.   ABNORMALITY - Continuous slow, generalized IMPRESSION: This study is suggestive of moderate to severe diffuse encephalopathy, nonspecific etiology. No seizures or epileptiform discharges were seen throughout the  recording. PLora Havens  ECHOCARDIOGRAM COMPLETE  Result Date: 01/28/2023    ECHOCARDIOGRAM REPORT   Patient Name:   Angela MCELVANYDate of Exam: 01/28/2023 Medical Rec #:  [id number]     Height:       65.0 in Accession #:    [id number]    Weight:       145.3 lb Date of Birth:  [birth year]     BSA:          1.727 m Patient Age:    36 years       BP:           133/83 mmHg Patient Gender: F              HR:           72 bpm. Exam Location:  Inpatient Procedure: 2D Echo, Cardiac Doppler, Color Doppler and 3D Echo Indications:    Stroke I63.9  History:        Patient has prior history of Echocardiogram examinations, most                 recent 12/23/2021. Risk Factors:Diabetes, Hypertension,                 Dyslipidemia and Former Smoker.  Sonographer:    NGreer PickerelReferring Phys: [id number] ARORA  Sonographer Comments: Echo performed with patient supine and on artificial respirator. Image acquisition challenging due to respiratory motion. Global longitudinal strain was attempted. IMPRESSIONS  1. There is a moderate pericardial effusion anterior to the right ventricle and a small pericardial effusion posterior to the left ventricle. There is no evidence of cardiac tamponade.  2. Left ventricular ejection fraction, by estimation, is 60 to 65%. The left ventricle has normal function. The left ventricle has no regional wall motion abnormalities. There is severe left ventricular hypertrophy. Left ventricular diastolic parameters  are indeterminate.  3. Right ventricular systolic function is normal. The right ventricular size is mildly enlarged. There is normal pulmonary artery systolic pressure. Dilated pulmonary artery.  4. Left atrial size was moderately dilated.  5. The mitral valve is normal in structure. Trivial mitral valve regurgitation. No evidence of mitral stenosis.  6. The aortic valve is tricuspid. Aortic valve regurgitation is not visualized. No aortic stenosis is present.  7. The inferior vena  cava is normal in size with <50% respiratory variability, suggesting right atrial pressure of 8 mmHg. Comparison(s): Changes from prior study are noted. Moderate pericardial effusion is new with no evidence of tamponade. There is severe left ventricular hypertrophy, echogenic. Consider PYP scan to  r/o amyloidosis. FINDINGS  Left Ventricle: Left ventricular ejection fraction, by estimation, is 60 to 65%. The left ventricle has normal function. The left ventricle has no regional wall motion abnormalities. The left ventricular internal cavity size was normal in size. There is  severe left ventricular hypertrophy. Left ventricular diastolic parameters are indeterminate. Right Ventricle: The right ventricular size is mildly enlarged. No increase in right ventricular wall thickness. Right ventricular systolic function is normal. There is normal pulmonary artery systolic pressure. The tricuspid regurgitant velocity is 1.40  m/s, and with an assumed right atrial pressure of 8 mmHg, the estimated right ventricular systolic pressure is [id number] mmHg. Left Atrium: Left atrial size was moderately dilated. Right Atrium: Right atrial size was normal in size. Pericardium: A moderately sized pericardial effusion is present. The pericardial effusion is anterior to the right ventricle. There is no evidence of cardiac tamponade. Mitral Valve: The mitral valve is normal in structure. Trivial mitral valve regurgitation. No evidence of mitral valve stenosis. Tricuspid Valve: The tricuspid valve is normal in structure. Tricuspid valve regurgitation is trivial. No evidence of tricuspid stenosis. Aortic Valve: The aortic valve is tricuspid. Aortic valve regurgitation is not visualized. No aortic stenosis is present. Pulmonic Valve: The pulmonic valve was normal in structure. Pulmonic valve regurgitation is mild to moderate. No evidence of pulmonic stenosis. Aorta: The aortic root is normal in size and structure. Venous: The inferior vena cava  is normal in size with less than 50% respiratory variability, suggesting right atrial pressure of 8 mmHg. IAS/Shunts: No atrial level shunt detected by color flow Doppler.  LEFT VENTRICLE PLAX 2D LVIDd:         4.10 cm   Diastology LVIDs:         2.70 cm   LV e' medial:    4.46 cm/s LV PW:         1.20 cm   LV E/e' medial:  20.2 LV IVS:        1.30 cm   LV e' lateral:   7.40 cm/s LVOT diam:     2.10 cm   LV E/e' lateral: 12.2 LV SV:         97 LV SV Index:   56 LVOT Area:     3.46 cm                           3D Volume EF:                          3D EF:        52 %                          LV EDV:       164 ml                          LV ESV:       78 ml                          LV SV:        86 ml RIGHT VENTRICLE RV S prime:     11.30 cm/s TAPSE (M-mode): 1.6 cm LEFT ATRIUM           Index        RIGHT ATRIUM  Index LA diam:      3.30 cm 1.91 cm/m   RA Area:     16.90 cm LA Vol (A2C): 84.9 ml 49.16 ml/m  RA Volume:   46.20 ml  26.75 ml/m LA Vol (A4C): 61.5 ml 35.61 ml/m  AORTIC VALVE             PULMONIC VALVE LVOT Vmax:   158.00 cm/s PR End Diast Vel: 2.84 msec LVOT Vmean:  95.200 cm/s LVOT VTI:    0.280 m  AORTA Ao Root diam: 3.40 cm Ao Asc diam:  3.60 cm MITRAL VALVE               TRICUSPID VALVE MV Area (PHT): 2.45 cm    TR Peak grad:   7.8 mmHg MV Decel Time: 310 msec    TR Vmax:        140.00 cm/s MR Peak grad: 6.6 mmHg MR Vmax:      128.00 cm/s  SHUNTS MV E velocity: 90.10 cm/s  Systemic VTI:  0.28 m MV A velocity: 65.50 cm/s  Systemic Diam: 2.10 cm MV E/A ratio:  1.38 Vishnu Priya Mallipeddi Electronically signed by Lorelee Cover Mallipeddi Signature Date/Time: 01/28/2023/2:19:31 PM    Final    DG CHEST PORT 1 VIEW  Result Date: 01/28/2023 CLINICAL DATA:  Ventilator dependent EXAM: PORTABLE CHEST 1 VIEW COMPARISON:  01/27/2023 FINDINGS: Endotracheal tube with the tip 4.5 cm above the carina. Left jugular central venous catheter with the tip projecting over the lower SVC. Nasogastric tube  projecting over the stomach with the tip excluded from the field of view. Left lower lobe airspace disease likely reflecting atelectasis. No pleural effusion or pneumothorax. Mild stable enlargement of the cardiac silhouette. No acute osseous abnormality. IMPRESSION: 1. Support lines and tubing in satisfactory position. 2. Left lower lobe airspace disease likely reflecting atelectasis. Electronically Signed   By: Kathreen Devoid M.D.   On: 01/28/2023 09:26   CT HEAD WO CONTRAST  Result Date: 01/28/2023 CLINICAL DATA:  Follow-up examination for hemorrhagic stroke. EXAM: CT HEAD WITHOUT CONTRAST TECHNIQUE: Contiguous axial images were obtained from the base of the skull through the vertex without intravenous contrast. RADIATION DOSE REDUCTION: This exam was performed according to the departmental dose-optimization program which includes automated exposure control, adjustment of the mA and/or kV according to patient size and/or use of iterative reconstruction technique. COMPARISON:  Prior CT from earlier the same day. FINDINGS: Brain: Intraparenchymal hematoma centered at the anterior left basal ganglia again seen, not significantly changed in size and morphology from prior. Intraventricular extension with blood throughout the ventricular system, similar. There has been interval placement of a right frontal approach ventriculostomy with tip in the right lateral ventricle. Overall ventricular size is similar with persistent mild hydrocephalus. Basilar cisterns remain patent. No more than mild localized left-to-right shift at the level of the hematoma. No new intracranial hemorrhage. No new large vessel territory infarct. No mass lesion or extra-axial fluid collection. Vascular: No abnormal hyperdense vessel. Calcified atherosclerosis present at the skull base. Skull: Right frontal approach ventriculostomy. Associated soft tissue swelling and emphysema at the right frontal scalp. Calvarium otherwise intact.  Sinuses/Orbits: Visualized globes and orbital soft tissues demonstrate no acute finding. Large right maxillary sinus retention cyst noted. Scattered mucosal thickening noted about the left maxillary sinus. Mastoid air cells remain clear. Other: None. IMPRESSION: 1. Interval placement of a right frontal approach ventriculostomy with tip in the right lateral ventricle. Overall ventricular size is similar with persistent mild hydrocephalus.  2. No significant interval change in size and morphology of intraparenchymal hematoma centered at the anterior left basal ganglia with intraventricular extension. 3. No other new acute intracranial abnormality. Electronically Signed   By: Jeannine Boga M.D.   On: 01/28/2023 00:00   DG CHEST PORT 1 VIEW  Result Date: 01/27/2023 CLINICAL DATA:  Central line placement. EXAM: PORTABLE CHEST 1 VIEW COMPARISON:  Chest radiograph dated 01/27/2023. FINDINGS: Endotracheal tube with tip in similar position above the carina. Enteric tube extends below the diaphragm with tip beyond the inferior margin of the image. Interval placement of a left IJ central venous line with tip close to the cavoatrial junction. No pneumothorax. There is cardiomegaly with vascular congestion. No focal consolidation, pleural effusion, or pneumothorax. No acute osseous pathology. IMPRESSION: 1. Interval placement of a left IJ central venous line with tip close to the cavoatrial junction. No pneumothorax. 2. Cardiomegaly with vascular congestion. Electronically Signed   By: Anner Crete M.D.   On: 01/27/2023 22:22   DG Abd Portable 1V  Result Date: 01/27/2023 CLINICAL DATA:  OG tube placement EXAM: PORTABLE ABDOMEN - 1 VIEW COMPARISON:  None Available. FINDINGS: Esophageal tube tip overlies the distal stomach. Upper gas pattern is unremarkable IMPRESSION: Esophageal tube tip overlies the distal stomach. Electronically Signed   By: Donavan Foil M.D.   On: 01/27/2023 19:09   DG Chest Portable 1  View  Result Date: 01/27/2023 CLINICAL DATA:  Altered mental status. EXAM: PORTABLE CHEST 1 VIEW COMPARISON:  Chest radiograph dated 12/16/2022. FINDINGS: Endotracheal tube with tip approximately 3 cm above the carina. Enteric tube extends below the diaphragm with tip beyond the inferior margin of the image. There is mild cardiomegaly with mild central vascular congestion. No focal consolidation, pleural effusion, or pneumothorax. No acute osseous pathology. IMPRESSION: 1. Endotracheal tube above the carina. 2. Mild cardiomegaly with mild central vascular congestion. Electronically Signed   By: Anner Crete M.D.   On: 01/27/2023 18:20   CT ANGIO HEAD NECK W WO CM  Result Date: 01/27/2023 CLINICAL DATA:  Stroke, hemorrhagic. EXAM: CT ANGIOGRAPHY HEAD AND NECK TECHNIQUE: Multidetector CT imaging of the head and neck was performed using the standard protocol during bolus administration of intravenous contrast. Multiplanar CT image reconstructions and MIPs were obtained to evaluate the vascular anatomy. Carotid stenosis measurements (when applicable) are obtained utilizing NASCET criteria, using the distal internal carotid diameter as the denominator. RADIATION DOSE REDUCTION: This exam was performed according to the departmental dose-optimization program which includes automated exposure control, adjustment of the mA and/or kV according to patient size and/or use of iterative reconstruction technique. CONTRAST:  38m OMNIPAQUE IOHEXOL 350 MG/ML SOLN COMPARISON:  Head CT January 27, 2023. FINDINGS: CTA NECK FINDINGS Aortic arch: Common origin of the innominate and left common carotid artery from the aortic arch. Imaged portion shows no evidence of aneurysm or dissection. No significant stenosis of the major arch vessel origins. Right carotid system: No evidence of dissection, stenosis (50% or greater), or occlusion. Left carotid system: No evidence of dissection, stenosis (50% or greater), or occlusion.  Vertebral arteries: No evidence of dissection, stenosis (50% or greater), or occlusion. Skeleton: Apparent fracture of the right humeral shaft is likely artifactual. Other neck: Negative. 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 Review of the MIP images confirms the above findings CTA HEAD FINDINGS Anterior circulation: No significant stenosis, proximal  occlusion, aneurysm, or vascular malformation. Calcified atherosclerotic plaques in the bilateral carotid siphons. Posterior circulation: No significant stenosis, proximal occlusion, aneurysm, or vascular malformation. Venous sinuses: As permitted by contrast timing, patent. Anatomic variants: None significant. Review of the MIP images confirms the above findings IMPRESSION: 1. No evidence of an aneurysm or vascular malformation to explain known intraventricular hemorrhage. 2. No intracranial large vessel occlusion or significant stenosis. 3. No hemodynamically significant stenosis in the neck. 4. 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 5. Apparent fracture of the right humeral shaft is likely artifactual. Correlate with physical exam and radiographs if clinically appropriate. Electronically Signed   By: Pedro Earls M.D.   On: 01/27/2023 17:05   CT Head Wo Contrast  Result Date: 01/27/2023 CLINICAL DATA:  Mental status change, cause. EXAM: CT HEAD WITHOUT CONTRAST TECHNIQUE: Contiguous axial images were obtained from the base of the skull through the vertex without intravenous contrast. RADIATION DOSE REDUCTION: This exam was performed according to the departmental dose-optimization program which includes automated exposure control, adjustment of the mA and/or kV according to  patient size and/or use of iterative reconstruction technique. COMPARISON:  Head CT December 16, 2022. FINDINGS: Brain: A 4.3 x 2.3 x 2.4 cm hematoma within the left lateral ventricle (estimated volume 12 mL). There is red is strip view shin to the right lateral ventricle, third and fourth ventricles. There is mild hydrocephalus with mild prominence of the temporal and occipital horns. No intraparenchymal hemorrhage or ischemic infarct. No midline shift. Vascular: Calcified plaques in the bilateral carotid siphons and intracranial vertebral arteries. No hyperdense vessel. Skull: Normal. Negative for fracture or focal lesion. Sinuses/Orbits: Large mucous retention cyst in the right maxillary sinus. The orbits are maintained. Other: None. IMPRESSION: A 4.3 x 2.3 x 2.4 cm hematoma within the left lateral ventricle, with intraventricular extension. 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 These results were called by telephone at the time of interpretation on 01/27/2023 at 4:49 pm to provider Sherwood Gambler , who verbally acknowledged these results. Electronically Signed   By: Pedro Earls M.D.   On: 01/27/2023 16:49   Medications:  clevidipine 9 mg/hr (01/29/23 0800)   Dextrose10%/Nacl 0.9%  1069m 60 mL/hr at 01/29/23 0820   feeding supplement (VITAL 1.5 CAL) Stopped (01/29/23 0645)   fentaNYL infusion INTRAVENOUS 100 mcg/hr (01/29/23 0800)   propofol (DIPRIVAN) infusion 10 mcg/kg/min (01/29/23 0800)    busPIRone  15 mg Per Tube QHS   Chlorhexidine Gluconate Cloth  6 each Topical Daily   Chlorhexidine Gluconate Cloth  6 each Topical Q0600   docusate  100 mg Per Tube BID   escitalopram  20 mg Per Tube Daily   feeding supplement (PROSource TF20)  60 mL Per Tube Daily   heparin injection (subcutaneous)  5,000 Units Subcutaneous Q8H   insulin aspart  0-6 Units Subcutaneous Q4H   levETIRAcetam  500 mg Per Tube BID   levETIRAcetam  250 mg Per Tube Q  M,W,F-1800   levothyroxine  175 mcg Per Tube q morning   multivitamin  1 tablet Per Tube QHS   mouth rinse  15 mL Mouth Rinse Q2H   pantoprazole (PROTONIX) IV  40 mg Intravenous QHS   polyethylene glycol  17 g Per Tube Daily   senna-docusate  1 tablet Per Tube BID    Outpatient Dialysis Orders:  MWF [facility] [address] 3.5h  62kg  2/2.5  NO HEPARIN (acute IC bleed/ was getting Hep 2600 + 500 u/hr)  LUA AVF - RN said pt's vol has been up for a few weeks, having to do extra sessions    Assessment/Plan: Acute IC bleed - sp EVD per neurosurgery, on 3% saline at 50 cc/hr. Na 141 today HTN emergency - sp IV cleviprex, goal SPB 130- 150. BP improved today T1DM - per pmd ESRD - on HD MWF. Has 1.5 hr HD Friday, extra treatment early this AM.No heparin d/t intracranial bleed.  HTN/ volume - 2.8L UF with HD yesterday. Needs updated weight, should be close to EDW now but still with trace edema. UFG 2 again tomorrow. 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 Will need to request outpatient ESA/Fe records tomorrow.  MBD ckd - Corrected calcium elevated, no VDRA. Phos at goal.   Anice Paganini, PA-C 01/29/2023, 8:23 AM  [facility] Pager: [phone]

## 2023-01-29 NOTE — Progress Notes (Signed)
NAME:  Angela [name], MRN:  [id number], DOB:  [birth year], LOS: 2 ADMISSION DATE:  01/27/2023, CONSULTATION DATE:  2/9 REFERRING MD:  Dr. Rory Percy, CHIEF COMPLAINT:  Altered Mental Status   History of Present Illness:  37 y/o F who presented to [facility] on 2/9 with reports of altered mental status.    The patient was reported at dialysis and after an hour of treatment she had a syncopal episode.  Her eyes rolled back into her head but there was no seizure activity. Staff at HD center were about to start CPR but they had a pulse.  She was hypertensive in the center and was given 0.2 mg clonidine prior to HD.   In ER, patient blood pressure was 207/113, she was started on Cleviprex infusion, after given 30 mg of IV labetalol.  CT head was done which showed left basal ganglia intraparenchymal hemorrhage with extension to IVH, CTA head and neck was negative for aneurysm.  As patient was found obtunded, she was intubated.  PCCM was consulted to help medical management  Pertinent  Medical History  DM I with DM retinopathy , ESRD on HD, Depression, HTN, HLD, Hypothyroidism, Anemia  Significant Hospital Events: Including procedures, antibiotic start and stop dates in addition to other pertinent events   2/09 Admit with ICH, neurosurgery consulted and EVD placed 2/10 hypoglycemic episodes 2/11 off 3% saline  Interim History / Subjective:  Blood sugars better.  Had vomiting episode overnight and tube feeds held.  Objective   Blood pressure 138/73, pulse 88, temperature 98.4 F (36.9 C), resp. rate 18, height 5' 5"$  (1.651 m), weight 65.9 kg, SpO2 98 %.    Vent Mode: PRVC FiO2 (%):  [40 %] 40 % Set Rate:  [18 bmp] 18 bmp Vt Set:  [450 mL] 450 mL PEEP:  [5 cmH20] 5 cmH20 Plateau Pressure:  [16 cmH20-18 cmH20] 18 cmH20   Intake/Output Summary (Last 24 hours) at 01/29/2023 1029 Last data filed at 01/29/2023 0800 Gross per 24 hour  Intake 2901.77 ml  Output 2959 ml  Net -57.23 ml   Filed Weights    01/27/23 1754  Weight: 65.9 kg    Examination:  General - sedated Eyes - pupils reactive ENT - ETT in place Cardiac - regular rate/rhythm, no murmur Chest - equal breath sounds b/l, no wheezing or rales Abdomen - soft, non tender, + bowel sounds Extremities - no cyanosis, clubbing, or edema Skin - no rashes Neuro - drain in brain, RASS -3  Resolved Hospital Problem list     Assessment & Plan:   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 - EVD per neurosurgery - f/u neuro imaging per neurology - keppra for seizure prophylaxis  HTN emergency. - goal SBP 130 to 150 - Echo 2/10 >> moderate pericardial effusion w/o tamponade; suspect will improve with dialysis  Compromised airway. 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 Episodes of hypoglycemia noted 2/10 >> resolved 2/11. - d/c D10 - restart tube feeds - SSI - hold semglee for now  Nausea with vomiting. - prn zofran  Hx of hypothyroidism. - continue synthroid  ESRD. - consulted nephrology for iHD  Anemia of chronic disease. - f/u CBC intermittently - transfuse for Hb < 7  Hx of depression. - continue buspar, lexapro  Best Practice (right click and "Reselect all SmartList Selections" daily)   Diet/type:  tubefeeds DVT prophylaxis: SCD GI prophylaxis: PPI Lines: Central line Foley:  N/A Code Status:  full code Last date of multidisciplinary goals of care discussion [updated her mother at bedside]  Labs       Latest Ref Rng & Units 01/29/2023    3:42 AM 01/28/2023    3:09 PM 01/28/2023    9:36 AM  CMP  Glucose 70 - 99 mg/dL 170  64    BUN 6 - 20 mg/dL 23  18    Creatinine 0.44 - 1.00 mg/dL 5.21  4.62    Sodium 135 - 145 mmol/L 141  140  140   Potassium 3.5 - 5.1 mmol/L 4.4  3.5    Chloride 98 - 111 mmol/L 106  105    CO2 22 - 32 mmol/L 23  25    Calcium 8.9 - 10.3  mg/dL 9.7  9.4    Total Protein 6.5 - 8.1 g/dL 6.5     Total Bilirubin 0.3 - 1.2 mg/dL 0.5     Alkaline Phos 38 - 126 U/L 174     AST 15 - 41 U/L 115     ALT 0 - 44 U/L 58          Latest Ref Rng & Units 01/29/2023    3:42 AM 01/28/2023    3:09 PM 01/28/2023    4:15 AM  CBC  WBC 4.0 - 10.5 K/uL 13.0   7.7   Hemoglobin 12.0 - 15.0 g/dL 10.0  8.6  6.4   Hematocrit 36.0 - 46.0 % 30.4  26.7  19.6   Platelets 150 - 400 K/uL 129   112     ABG    Component Value Date/Time   PHART 7.443 01/27/2023 1746   PCO2ART 38.3 01/27/2023 1746   PO2ART 551 (H) 01/27/2023 1746   HCO3 26.2 01/27/2023 1746   TCO2 27 01/27/2023 1746   ACIDBASEDEF 6.0 (H) 12/16/2022 1243   O2SAT 100 01/27/2023 1746    CBG (last 3)  Recent Labs    01/28/23 2315 01/29/23 0341 01/29/23 0748  GLUCAP 281* 163* 294*    Critical care time: 37 minutes  Chesley Mires, MD [place] Pager - [phone] or [phone] 01/29/2023, 10:29 AM

## 2023-01-29 NOTE — Plan of Care (Signed)
  Problem: Nutrition: Goal: Dietary intake will improve Outcome: Not Progressing    Patient with episodes of vomiting overnight. TF stopped by night RN. CCM on rounds this AM. Zofran ordered, restart tube feeds at trickle feed 10cc/hr. Patient's mother at bedside and educated with good understanding verbalized.

## 2023-01-29 NOTE — Progress Notes (Signed)
Paged Bhagat MD (Neuro) due to pt SBP 160-170. Goal SBP 100-160. Pt will be receiving dialysis this evening, so was instructed per MD to hold off on ordering any PRN BP meds or restarting Cleviprex gtt. Will monitor for BP decrease during dialysis.   Pt restarted on propofol gtt due to agitation. Pt sitting completely up in bed without any stimulation. Concerned for pt safety with pt moving so much and EVD unclamped. Per MD, instructed to monitor for BP decrease while back on propofol.

## 2023-01-29 NOTE — Progress Notes (Signed)
PT Cancellation Note  Patient Details Name: Angela [name] MRN: [id number] DOB: [birth year]   Cancelled Treatment:    Reason Eval/Treat Not Completed: Patient not medically ready. Pt intubated and sedated with ventric placed. Acute PT to return as able, as appropriate to complete PT eval.  Kittie Plater, PT, DPT Acute Rehabilitation Services Secure chat preferred Office #: [phone]    Berline Lopes 01/29/2023, 9:57 AM

## 2023-01-30 ENCOUNTER — Inpatient Hospital Stay (HOSPITAL_COMMUNITY): Payer: Medicaid Other

## 2023-01-30 DIAGNOSIS — I619 Nontraumatic intracerebral hemorrhage, unspecified: Secondary | ICD-10-CM | POA: Diagnosis not present

## 2023-01-30 LAB — CBC
HCT: 25.5 % — ABNORMAL LOW (ref 36.0–46.0)
Hemoglobin: 8.1 g/dL — ABNORMAL LOW (ref 12.0–15.0)
MCH: 29 pg (ref 26.0–34.0)
MCHC: 31.8 g/dL (ref 30.0–36.0)
MCV: 91.4 fL (ref 80.0–100.0)
Platelets: 103 10*3/uL — ABNORMAL LOW (ref 150–400)
RBC: 2.79 MIL/uL — ABNORMAL LOW (ref 3.87–5.11)
RDW: 14.7 % (ref 11.5–15.5)
WBC: 14.8 10*3/uL — ABNORMAL HIGH (ref 4.0–10.5)
nRBC: 0 % (ref 0.0–0.2)

## 2023-01-30 LAB — BASIC METABOLIC PANEL
Anion gap: 13 (ref 5–15)
BUN: 23 mg/dL — ABNORMAL HIGH (ref 6–20)
CO2: 26 mmol/L (ref 22–32)
Calcium: 9.8 mg/dL (ref 8.9–10.3)
Chloride: 98 mmol/L (ref 98–111)
Creatinine, Ser: 4.62 mg/dL — ABNORMAL HIGH (ref 0.44–1.00)
GFR, Estimated: 12 mL/min — ABNORMAL LOW (ref >60)
Glucose, Bld: 158 mg/dL — ABNORMAL HIGH (ref 70–99)
Potassium: 4.4 mmol/L (ref 3.5–5.1)
Sodium: 137 mmol/L (ref 135–145)

## 2023-01-30 LAB — TRIGLYCERIDES: Triglycerides: 75 mg/dL (ref <150)

## 2023-01-30 LAB — GLUCOSE, CAPILLARY
Glucose-Capillary: 164 mg/dL — ABNORMAL HIGH (ref 70–99)
Glucose-Capillary: 192 mg/dL — ABNORMAL HIGH (ref 70–99)
Glucose-Capillary: 128 mg/dL — ABNORMAL HIGH (ref 70–99)
Glucose-Capillary: 364 mg/dL — ABNORMAL HIGH (ref 70–99)
Glucose-Capillary: 331 mg/dL — ABNORMAL HIGH (ref 70–99)
Glucose-Capillary: 339 mg/dL — ABNORMAL HIGH (ref 70–99)

## 2023-01-30 LAB — MAGNESIUM: Magnesium: 1.8 mg/dL (ref 1.7–2.4)

## 2023-01-30 MED ORDER — LEVETIRACETAM IN NACL 500 MG/100ML IV SOLN
500.0000 mg | Freq: Two times a day (BID) | INTRAVENOUS | Status: DC
  Administered 2023-01-30 – 2023-02-01 (×4): 500 mg via INTRAVENOUS
  Filled 2023-01-30 (×4): qty 100

## 2023-01-30 MED ORDER — PENTAFLUOROPROP-TETRAFLUOROETH EX AERO: 1.0000 | INHALATION_SPRAY | CUTANEOUS | Status: DC | PRN

## 2023-01-30 MED ORDER — LIDOCAINE-PRILOCAINE 2.5-2.5 % EX CREA: 1.0000 | TOPICAL_CREAM | CUTANEOUS | Status: DC | PRN

## 2023-01-30 MED ORDER — HYDRALAZINE HCL 20 MG/ML IJ SOLN
20.0000 mg | INTRAMUSCULAR | Status: DC | PRN
  Administered 2023-01-30 – 2023-02-03 (×10): 20 mg via INTRAVENOUS
  Filled 2023-01-30 (×12): qty 1

## 2023-01-30 MED ORDER — LABETALOL HCL 5 MG/ML IV SOLN
20.0000 mg | INTRAVENOUS | Status: DC | PRN
  Administered 2023-01-30 – 2023-02-03 (×12): 20 mg via INTRAVENOUS
  Filled 2023-01-30 (×14): qty 4

## 2023-01-30 MED ORDER — LIDOCAINE HCL (PF) 1 % IJ SOLN: 5.0000 mL | INTRAMUSCULAR | Status: DC | PRN

## 2023-01-30 NOTE — Progress Notes (Signed)
[place] Progress Note Patient Name: Tykeya Anthis DOB: [birth year] MRN: [id number]   Date of Service  01/30/2023  HPI/Events of Note  need restraint orders renewed  eICU Interventions  Renew given ongoing need   0446 - Fairly refractory hypoglycemia despite sensitive scale.  Will advance to refractory scale.  Likely needs some basal as well but nadirs in the 100's  Ongoing n/v despite zofran, add phenergan; qtc 467  Intervention Category Minor Interventions: Agitation / anxiety - evaluation and management  Raquan Iannone 01/30/2023, 10:29 PM

## 2023-01-30 NOTE — Procedures (Signed)
Extubation Procedure Note  Patient Details:   Name: Angela [name] DOB: 1986-10-14 MRN: [id number]   Airway Documentation:    Vent end date: 01/30/23 Vent end time: 1542   Evaluation  O2 sats: stable throughout and currently acceptable Complications: No apparent complications Patient did tolerate procedure well. Bilateral Breath Sounds: Clear, Diminished   No  Pt extubated to 3L [place] per MD order. Positive cuff leak noted and able to stick out tongue prior to extubation. Pt unable/unwilling to speak post extubation. Possible alight stridor noted. RT will closely monitor for need to re-intubate pt  Jesse Sans 01/30/2023, 3:43 PM

## 2023-01-30 NOTE — Progress Notes (Signed)
Pt transported from 4N22 to CT and back to [id number] with no complications.

## 2023-01-30 NOTE — TOC CAGE-AID Note (Signed)
Transition of Care [facility]) - CAGE-AID Screening   Patient Details  Name: Mckenly Bencomo MRN: [id number] Date of Birth: 10-03-1986  Transition of Care [facility]) CM/SW Contact:    Benard Halsted, LCSW Phone Number: 01/30/2023, 8:09 AM   Clinical Narrative: Patient intubated and unable to participate in screening at this time.    CAGE-AID Screening: Substance Abuse Screening unable to be completed due to: : Patient unable to participate

## 2023-01-30 NOTE — Progress Notes (Signed)
  Transition of Care [facility]) Screening Note   Patient Details  Name: Angela [name] Date of Birth: 22-Mar-1986   Transition of Care [facility]) CM/SW Contact:    Benard Halsted, LCSW Phone Number: 01/30/2023, 8:20 AM    Transition of Care Department [facility]) has reviewed patient and no TOC needs have been identified at this time. We will continue to monitor patient advancement through interdisciplinary progression rounds. If new patient transition needs arise, please place a TOC consult.

## 2023-01-30 NOTE — Progress Notes (Addendum)
STROKE TEAM PROGRESS NOTE   INTERVAL HISTORY No family at the bedside. She is receiving HD. She is on Prop 2 76mg and Fentanyl 1059m IV. Cleviprex has been turned off. Will intermittently follow commands  WBC 14.8, afebrile will continue to monitor. EVD in place and draining well., remains intubated still for respiratory failure. CCM may try to extubate later today.  Vitals:   01/30/23 1142 01/30/23 1200 01/30/23 1216 01/30/23 1300  BP: 114/81 130/83 127/80 (!) 150/89  Pulse: 80 79 78 78  Resp: 18 18 18 18  $ Temp: 98.1 F (36.7 C) 98.1 F (36.7 C) 98.1 F (36.7 C) 98.2 F (36.8 C)  TempSrc:  Esophageal    SpO2: 98% 97% 99% 100%  Weight: 65.6 kg     Height:       CBC:  Recent Labs  Lab 01/27/23 1536 01/27/23 1547 01/29/23 0342 01/30/23 0357  WBC 9.1   < > 13.0* 14.8*  NEUTROABS 6.8  --   --   --   HGB 7.5*   < > 10.0* 8.1*  HCT 23.9*   < > 30.4* 25.5*  MCV 93.7   < > 89.7 91.4  PLT 124*   < > 129* 103*   < > = values in this interval not displayed.    Basic Metabolic Panel:  Recent Labs  Lab 01/29/23 0342 01/29/23 1843 01/30/23 0357  NA 141  --  137  K 4.4  --  4.4  CL 106  --  98  CO2 23  --  26  GLUCOSE 170*  --  158*  BUN 23*  --  23*  CREATININE 5.21*  --  4.62*  CALCIUM 9.7  --  9.8  MG 2.0 1.8 1.8  PHOS 4.7* 4.6  --     Lipid Panel:  Recent Labs  Lab 01/28/23 0300 01/30/23 0357  CHOL 247*  --   TRIG 65  64 75  HDL 119  --   CHOLHDL 2.1  --   VLDL 13  --   LDLCALC 115*  --     HgbA1c:  Recent Labs  Lab 01/28/23 0300  HGBA1C 12.0*    Urine Drug Screen:  Recent Labs  Lab 01/28/23 0715  LABOPIA NONE DETECTED  COCAINSCRNUR NONE DETECTED  LABBENZ NONE DETECTED  AMPHETMU NONE DETECTED  THCU POSITIVE*  LABBARB NONE DETECTED     Alcohol Level  Recent Labs  Lab 01/27/23 1833  ETH <10     IMAGING past 24 hours CT HEAD WO CONTRAST (5MM)  Result Date: 01/30/2023 CLINICAL DATA:  [age]ear old female dialysis patient who  presented with altered mental status and hemorrhage into the ventricular system on 01/27/2023. Status post EVD. EXAM: CT HEAD WITHOUT CONTRAST TECHNIQUE: Contiguous axial images were obtained from the base of the skull through the vertex without intravenous contrast. RADIATION DOSE REDUCTION: This exam was performed according to the departmental dose-optimization program which includes automated exposure control, adjustment of the mA and/or kV according to patient size and/or use of iterative reconstruction technique. COMPARISON:  Head CTs 01/27/2023 and earlier. FINDINGS: Brain: Right anterior frontal approach ventriculostomy catheter terminates in the frontal horn as before. Decreased ventricle size since 2323 hours on 01/27/2023. Volume of intraventricular hemorrhage appears stable., and the studies suggest intra-axial blood thin the left caudate head also, probably the hemorrhage origin. Intra-axial blood products size and configuration stable to slightly smaller, estimated intra-axial volume 5-6 mL. Stable regional mass effect including mild rightward midline shift at the anterior  septum pellucidum. Stable gray-white differentiation elsewhere. No new intracranial hemorrhage or ischemia identified. Vascular: Calcified atherosclerosis at the skull base. Skull: Stable.  No acute osseous abnormality identified. Sinuses/Orbits: Fluid in the visible pharynx. Patient is intubated on portable chest X am yesterday. Bilateral paranasal sinus fluid levels and opacification. Tympanic cavities and mastoids remain clear. Other: Broad-based right scalp hematoma superimposed on recent postoperative changes from external ventricular drain. Orbits soft tissues appear to remain negative. IMPRESSION: 1. Stable right frontal approach EVD with ventricular decompression since 01/27/2023. 2. Left caudate intra-axial hemorrhage suspected with secondary IVH. Estimated intra-axial blood volume of 5-6 mL, stable to mildly decreased since  01/27/2023. IVH not significantly changed. 3. No new intracranial abnormality. 4. Intubated with fluid in the pharynx and paranasal sinuses. Electronically Signed   By: [name] M.D.   On: 01/30/2023 05:57    PHYSICAL EXAM  Physical Exam  Constitutional: Appears well-developed and well-nourished middle-age African-American lady who is intubated and sedated.  She has ventriculostomy.   Cardiovascular: Normal rate and regular rhythm.  Respiratory: Mechanically ventilated   Neuro: Mental Status: Intubated, sedated but can be aroused and intermittently following commands  Cranial Nerves: With forced eye opening eyes are disconjugate with mild outward gaze.  Minimal dolls eyes, does attempt to move eyes to commands  Corneal, cough, gag present.   She is breathing over the vent. Head to the left.  Motor/sensory Moves left arm and leg more briskly than right arm and leg.  Localizes to painful stimuli  ASSESSMENT/PLAN [name] is a 37 y.o. female with history of ESRD on HD, DM, HTN, seizures, HLD, GERD, hypothyroidism, anxiety and depression who presents from dialysis center for evaluation of AMS and syncopal episode.   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 CTA head & neck unremarkable 2/10 - Repeat Head CT- s/p EVD. Overall ventricular size is similar with persistent mild hydrocephalus. Stable hematoma 2/12- CT head- 1. Stable right frontal approach EVD with ventricular decompression since 01/27/2023. Left caudate intra-axial hemorrhage suspected with secondary IVH. Estimated intra-axial blood volume of 5-6 mL, stable to mildly decreased since 01/27/2023. IVH not significantly changed. No new intracranial abnormality. 2D Echo 60-65%, left atrium is moderately  dilated LDL 115 HgbA1c 12.0 UDS positive for THC VTE prophylaxis - heparin subq No antithrombotic prior to admission, now on No antithrombotic.  Therapy recommendations: Pending Disposition: Pending  Acute Hypoxic Respiratory Failure Intubated 2/9 CCM management for ventilator  On prop and fentanyl gtts Goal SpO2 greater than 92%  Hydrocephalus  HTS at 40 -> switch to D10NS -> off  Na 133->138->140->141 EVD placed 2/9 by NSGY 10cmH2O->17cmH2O  Seizure disorder 09/2021 seizure triggered by hypoglycemia, on Keppra Home meds Keppra 500 mg BID (changed from home dose of daily) daily with 224m additional on dialysis days Not sure compliance Reported frequent seizure at home and with HD EEG 2/10:  Continuous slow, generalized, no seizures   Diabetes type I Uncontrolled Hypoglycemia  D10 d/c'd  SSI TF's restarted after having vomiting overnight  Home meds: Insulin HgbA1c 12.0, goal < 7.0 CBGs  Hypertensive emergency Home meds: Norvasc, Cardizem, furosemide, hydralazine, losartan Cleviprex turned off this am Resume home norvasc 10 Stable now BP goal < 160  Hyperlipidemia Home meds pravastatin 20 LDL 115, goal <  70 AST/ALT 63/45 -> 115/58 Hold off statin due to acute ICH and transaminitis Consider resume statin at discharge  Other Stroke Risk Factors Obesity, Body mass index is 24.07 kg/m., BMI >/= 30 associated with increased stroke risk, recommend weight loss, diet and exercise as appropriate  THC abuse, cessation education will be provided Migraine - followed with Dr. Jaynee Eagles at [facility]  Other Active Problems Hypokalemia, resolved  Replete and repeat BMP, check mag K 4.4 Severe anemia  Hgb 7.5-8.5-6.4 -> 1uPRBC 2/10, ->10.0 Repeat H/H ESRD with HD MWF HD done 2/9 only received 1 hour her 4 hour session Per her mother she had 5 dialysis sessions last week Received HD yesterday 2/10 Nephrology consulted by CCM  Hypothyroidism TSH 201.471 - continue  Synthroid Free T4 - 0.77 Anxiety, depression BuSpar, Lexapro Leukocytosis WBC 14.8 CXR - left lower lobe atelectasis  Afebrile  Hospital day # 3  Patient seen and examined by NP/APP with MD. MD to update note as needed.   Janine Ores, DNP, FNP-BC [facility] Pager: [phone]  STROKE MD NOTE :  I have personally obtained history,examined this patient, reviewed notes, independently viewed imaging studies, participated in medical decision making and plan of care.ROS completed by me personally and pertinent positives fully documented  I have made any additions or clarifications directly to the above note. Agree with note above.  Patient remains intubated for respiratory failure and has ventriculostomy catheter hydrocephalus neurological exam remains unchanged.  Recommend wean off sedation and extubate as tolerated per CCM.  Continue ventriculostomy drainage.  Strict control of hypertension with blood pressure goal below 160.  Continue dialysis as per nephrology.  No family available at the bedside.  Discussed with Dr. Valeta Harms critical care medicine.This patient is critically ill and at significant risk of neurological worsening, death and care requires constant monitoring of vital signs, hemodynamics,respiratory and cardiac monitoring, extensive review of multiple databases, frequent neurological assessment, discussion with family, other specialists and medical decision making of high complexity.I have made any additions or clarifications directly to the above note.This critical care time does not reflect procedure time, or teaching time or supervisory time of PA/NP/Med Resident etc but could involve care discussion time.  I spent 30 minutes of neurocritical care time  in the care of  this patient.      Antony Contras, MD Medical Director [facility] Pager: [phone] 01/30/2023 3:03 PM

## 2023-01-30 NOTE — Progress Notes (Signed)
Subjective: Patient intubated and weaned down on sedation.   Objective: Vital signs in last 24 hours: Temp:  [98.1 F (36.7 C)-98.6 F (37 C)] 98.2 F (36.8 C) (02/12 0700) Pulse Rate:  [78-95] 80 (02/12 0740) Resp:  [0-19] 18 (02/12 0740) BP: (130-184)/(73-95) 149/84 (02/12 0740) SpO2:  [94 %-100 %] 100 % (02/12 0740) FiO2 (%):  [40 %] 40 % (02/12 0740) Weight:  [68.6 kg] 68.6 kg (02/12 0600)  Intake/Output from previous day: 02/11 0701 - 02/12 0700 In: 1190.5 [I.V.:837.6; NG/GT:352.8] Out: 2977 [Drains:177] Intake/Output this shift: No intake/output data recorded.  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, opens eyes to voice  Lab Results: Lab Results  Component Value Date   WBC 14.8 (H) 01/30/2023   HGB 8.1 (L) 01/30/2023   HCT 25.5 (L) 01/30/2023   MCV 91.4 01/30/2023   PLT 103 (L) 01/30/2023   Lab Results  Component Value Date   INR 0.96 09/21/2017   BMET Lab Results  Component Value Date   NA 137 01/30/2023   K 4.4 01/30/2023   CL 98 01/30/2023   CO2 26 01/30/2023   GLUCOSE 158 (H) 01/30/2023   BUN 23 (H) 01/30/2023   CREATININE 4.62 (H) 01/30/2023   CALCIUM 9.8 01/30/2023    Studies/Results: CT HEAD WO CONTRAST (5MM)  Result Date: 01/30/2023 CLINICAL DATA:  37 year old female dialysis patient who presented with altered mental status and hemorrhage into the ventricular system on 01/27/2023. Status post EVD. EXAM: CT HEAD WITHOUT CONTRAST TECHNIQUE: Contiguous axial images were obtained from the base of the skull through the vertex without intravenous contrast. RADIATION DOSE REDUCTION: This exam was performed according to the departmental dose-optimization program which includes automated exposure control, adjustment of the mA and/or kV according to patient size and/or use of iterative reconstruction technique. COMPARISON:  Head CTs 01/27/2023 and earlier. FINDINGS: Brain: Right anterior frontal approach ventriculostomy catheter terminates in the  frontal horn as before. Decreased ventricle size since 2323 hours on 01/27/2023. Volume of intraventricular hemorrhage appears stable., and the studies suggest intra-axial blood thin the left caudate head also, probably the hemorrhage origin. Intra-axial blood products size and configuration stable to slightly smaller, estimated intra-axial volume 5-6 mL. Stable regional mass effect including mild rightward midline shift at the anterior septum pellucidum. Stable gray-white differentiation elsewhere. No new intracranial hemorrhage or ischemia identified. Vascular: Calcified atherosclerosis at the skull base. Skull: Stable.  No acute osseous abnormality identified. Sinuses/Orbits: Fluid in the visible pharynx. Patient is intubated on portable chest X am yesterday. Bilateral paranasal sinus fluid levels and opacification. Tympanic cavities and mastoids remain clear. Other: Broad-based right scalp hematoma superimposed on recent postoperative changes from external ventricular drain. Orbits soft tissues appear to remain negative. IMPRESSION: 1. Stable right frontal approach EVD with ventricular decompression since 01/27/2023. 2. Left caudate intra-axial hemorrhage suspected with secondary IVH. Estimated intra-axial blood volume of 5-6 mL, stable to mildly decreased since 01/27/2023. IVH not significantly changed. 3. No new intracranial abnormality. 4. Intubated with fluid in the pharynx and paranasal sinuses. Electronically Signed   By: [name] M.D.   On: 01/30/2023 05:57   EEG adult  Result Date: 01/28/2023 Angela Havens, MD     01/28/2023  7:28 PM Patient Name: Angela [name] MRN: [id number] Epilepsy Attending: Lora [name] Referring Physician/Provider: Rosalin Hawking, MD Date: 01/28/2023 Duration: 23.22 mins Patient history: 37yo F with h/o seizures now with L BG ICH. EEG to evaluate for seizure Level of alertness: comatose AEDs during EEG study:  LEV, propofol Technical aspects: This EEG study was done with  scalp electrodes positioned according to the 10-20 International system of electrode placement. Electrical activity was reviewed with band pass filter of 1-70Hz$ , sensitivity of 7 uV/mm, display speed of 76m/sec with a 60Hz$  notched filter applied as appropriate. EEG data were recorded continuously and digitally stored.  Video monitoring was available and reviewed as appropriate. Description: EEG showed continuous generalized 3 to 6 Hz theta-delta slowing. Hyperventilation and photic stimulation were not performed.   ABNORMALITY - Continuous slow, generalized IMPRESSION: This study is suggestive of moderate to severe diffuse encephalopathy, nonspecific etiology. No seizures or epileptiform discharges were seen throughout the recording. PLora [name]  ECHOCARDIOGRAM COMPLETE  Result Date: 01/28/2023    ECHOCARDIOGRAM REPORT   Patient Name:   Angela ALVARENGADate of Exam: 01/28/2023 Medical Rec #:  [id number]     Height:       65.0 in Accession #:    [id number]    Weight:       145.3 lb Date of Birth:  3December 06, 1987     BSA:          1.727 m Patient Age:    36 years       BP:           133/83 mmHg Patient Gender: F              HR:           72 bpm. Exam Location:  Inpatient Procedure: 2D Echo, Cardiac Doppler, Color Doppler and 3D Echo Indications:    Stroke I63.9  History:        Patient has prior history of Echocardiogram examinations, most                 recent 12/23/2021. Risk Factors:Diabetes, Hypertension,                 Dyslipidemia and Former Smoker.  Sonographer:    NGreer PickerelReferring Phys: [id number] [name]  Sonographer Comments: Echo performed with patient supine and on artificial respirator. Image acquisition challenging due to respiratory motion. Global longitudinal strain was attempted. IMPRESSIONS  1. There is a moderate pericardial effusion anterior to the right ventricle and a small pericardial effusion posterior to the left ventricle. There is no evidence of cardiac tamponade.  2. Left  ventricular ejection fraction, by estimation, is 60 to 65%. The left ventricle has normal function. The left ventricle has no regional wall motion abnormalities. There is severe left ventricular hypertrophy. Left ventricular diastolic parameters  are indeterminate.  3. Right ventricular systolic function is normal. The right ventricular size is mildly enlarged. There is normal pulmonary artery systolic pressure. Dilated pulmonary artery.  4. Left atrial size was moderately dilated.  5. The mitral valve is normal in structure. Trivial mitral valve regurgitation. No evidence of mitral stenosis.  6. The aortic valve is tricuspid. Aortic valve regurgitation is not visualized. No aortic stenosis is present.  7. The inferior vena cava is normal in size with <50% respiratory variability, suggesting right atrial pressure of 8 mmHg. Comparison(s): Changes from prior study are noted. Moderate pericardial effusion is new with no evidence of tamponade. There is severe left ventricular hypertrophy, echogenic. Consider PYP scan to r/o amyloidosis. FINDINGS  Left Ventricle: Left ventricular ejection fraction, by estimation, is 60 to 65%. The left ventricle has normal function. The left ventricle has no regional wall motion abnormalities. The left ventricular internal cavity size was normal  in size. There is  severe left ventricular hypertrophy. Left ventricular diastolic parameters are indeterminate. Right Ventricle: The right ventricular size is mildly enlarged. No increase in right ventricular wall thickness. Right ventricular systolic function is normal. There is normal pulmonary artery systolic pressure. The tricuspid regurgitant velocity is 1.40  m/s, and with an assumed right atrial pressure of 8 mmHg, the estimated right ventricular systolic pressure is [id number] mmHg. Left Atrium: Left atrial size was moderately dilated. Right Atrium: Right atrial size was normal in size. Pericardium: A moderately sized pericardial effusion is  present. The pericardial effusion is anterior to the right ventricle. There is no evidence of cardiac tamponade. Mitral Valve: The mitral valve is normal in structure. Trivial mitral valve regurgitation. No evidence of mitral valve stenosis. Tricuspid Valve: The tricuspid valve is normal in structure. Tricuspid valve regurgitation is trivial. No evidence of tricuspid stenosis. Aortic Valve: The aortic valve is tricuspid. Aortic valve regurgitation is not visualized. No aortic stenosis is present. Pulmonic Valve: The pulmonic valve was normal in structure. Pulmonic valve regurgitation is mild to moderate. No evidence of pulmonic stenosis. Aorta: The aortic root is normal in size and structure. Venous: The inferior vena cava is normal in size with less than 50% respiratory variability, suggesting right atrial pressure of 8 mmHg. IAS/Shunts: No atrial level shunt detected by color flow Doppler.  LEFT VENTRICLE PLAX 2D LVIDd:         4.10 cm   Diastology LVIDs:         2.70 cm   LV e' medial:    4.46 cm/s LV PW:         1.20 cm   LV E/e' medial:  20.2 LV IVS:        1.30 cm   LV e' lateral:   7.40 cm/s LVOT diam:     2.10 cm   LV E/e' lateral: 12.2 LV SV:         97 LV SV Index:   56 LVOT Area:     3.46 cm                           3D Volume EF:                          3D EF:        52 %                          LV EDV:       164 ml                          LV ESV:       78 ml                          LV SV:        86 ml RIGHT VENTRICLE RV S prime:     11.30 cm/s TAPSE (M-mode): 1.6 cm LEFT ATRIUM           Index        RIGHT ATRIUM           Index LA diam:      3.30 cm 1.91 cm/m   RA Area:     16.90 cm LA Vol (A2C): 84.9 ml 49.16 ml/m  RA Volume:  46.20 ml  26.75 ml/m LA Vol (A4C): 61.5 ml 35.61 ml/m  AORTIC VALVE             PULMONIC VALVE LVOT Vmax:   158.00 cm/s PR End Diast Vel: 2.84 msec LVOT Vmean:  95.200 cm/s LVOT VTI:    0.280 m  AORTA Ao Root diam: 3.40 cm Ao Asc diam:  3.60 cm MITRAL VALVE                TRICUSPID VALVE MV Area (PHT): 2.45 cm    TR Peak grad:   7.8 mmHg MV Decel Time: 310 msec    TR Vmax:        140.00 cm/s MR Peak grad: 6.6 mmHg MR Vmax:      128.00 cm/s  SHUNTS MV E velocity: 90.10 cm/s  Systemic VTI:  0.28 m MV A velocity: 65.50 cm/s  Systemic Diam: 2.10 cm MV E/A ratio:  1.38 Vishnu Priya Mallipeddi Electronically signed by Lorelee Cover Mallipeddi Signature Date/Time: 01/28/2023/2:19:31 PM    Final     Assessment/Plan: 37 year old s/p EVD placement. Improving slowly. Ct head is stable and her ventricles have decreased in size, less blood in the 4th. Continue EVD at [place]   LOS: 3 days    Angela [name] [facility] 01/30/2023, 7:42 AM

## 2023-01-30 NOTE — Progress Notes (Signed)
OT Cancellation Note  Patient Details Name: Angela [name] MRN: [id number] DOB: 1986-10-07   Cancelled Treatment:    Reason Eval/Treat Not Completed: Patient at procedure or test/ unavailable. Pt under going bedside HD at this time. Acute OT to return as able/as appropriate to complete OT eval.   Jolaine Artist, OT Acute Rehabilitation Services Office [place] 01/30/2023, 10:45 AM

## 2023-01-30 NOTE — Progress Notes (Signed)
Patient ID: Angela [name], female   DOB: 06-May-1986, 37 y.o.   MRN: [id number] [facility] Progress Note   Assessment/ Plan:   1.  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  She appears to be more responsive with plans noted to decrease/discontinue sedation later today. 2. ESRD: She is currently ongoing hemodialysis that we will continue on a Monday/Wednesday/Friday schedule with extra treatment (for volume) done over the weekend/Saturday. 3. Anemia: No overt blood loss noted with acceptable hemoglobin and hematocrit, will continue to follow for ESA requirements. 4. CKD-MBD: Corrected calcium marginally elevated with phosphorus level at goal.  Continue to monitor at this time off of binders. 5.  Acute hypoxic respiratory failure: Secondary to altered mental status in the setting of intracranial bleed.  Remains ventilator dependent. 6. Hypertension: Blood pressures under decent control on oral amlodipine with as needed IV hydralazine/labetalol that has been transitioned from intravenous clevidipine.  Subjective:   Without acute events overnight, ongoing hemodialysis when seen.   Objective:   BP 135/83   Pulse 80   Temp 98.1 F (36.7 C)   Resp 18   Ht 5' 5"$  (1.651 m)   Wt 68.3 kg   SpO2 99%   BMI 25.06 kg/m   Physical Exam: Gen: Intubated, sedated, comfortable CVS: Pulse regular rhythm, normal rate, S1 and S2 normal Resp: Clear to auscultation bilaterally, Abd: Soft, flat, nontender Ext: Left upper arm AV fistula cannulated for hemodialysis, no lower extremity edema.  1-2+ upper extremity edema noted  Labs: BMET Recent Labs  Lab 01/27/23 1536 01/27/23 1547 01/27/23 1746 01/27/23 2112 01/28/23 0300 01/28/23 0936 01/28/23 1509 01/28/23 1630 01/29/23 0342 01/29/23 1843 01/30/23 0357  NA 134* 135 132* 133* 138  138 140 140  --  141  --  137   K 3.7 3.8 3.6  --  2.9*  --  3.5  --  4.4  --  4.4  CL 98  --   --   --  101  --  105  --  106  --  98  CO2 24  --   --   --  25  --  25  --  23  --  26  GLUCOSE 361*  --   --   --  61*  --  64*  --  170*  --  158*  BUN 14  --   --   --  18  --  18  --  23*  --  23*  CREATININE 3.07*  --   --   --  3.83*  --  4.62*  --  5.21*  --  4.62*  CALCIUM 9.2  --   --   --  9.4  --  9.4  --  9.7  --  9.8  PHOS  --   --   --   --   --  4.8*  --  4.9* 4.7* 4.6  --    CBC Recent Labs  Lab 01/27/23 1536 01/27/23 1547 01/28/23 0300 01/28/23 0415 01/28/23 1509 01/29/23 0342 01/30/23 0357  WBC 9.1  --  7.7 7.7  --  13.0* 14.8*  NEUTROABS 6.8  --   --   --   --   --   --   HGB 7.5*   < > 6.6* 6.4* 8.6* 10.0* 8.1*  HCT 23.9*   < > 20.1* 19.6* 26.7* 30.4* 25.5*  MCV 93.7  --  89.7  90.3  --  89.7 91.4  PLT 124*  --  113* 112*  --  129* 103*   < > = values in this interval not displayed.    Medications:     amLODipine  10 mg Per Tube Daily   busPIRone  15 mg Per Tube QHS   Chlorhexidine Gluconate Cloth  6 each Topical Daily   docusate  100 mg Per Tube BID   escitalopram  20 mg Per Tube Daily   feeding supplement (PROSource TF20)  60 mL Per Tube Daily   heparin injection (subcutaneous)  5,000 Units Subcutaneous Q8H   insulin aspart  0-6 Units Subcutaneous Q4H   levETIRAcetam  500 mg Per Tube BID   levETIRAcetam  250 mg Per Tube Q M,W,F-1800   levothyroxine  175 mcg Per Tube q morning   multivitamin  1 tablet Per Tube QHS   mouth rinse  15 mL Mouth Rinse Q2H   pantoprazole (PROTONIX) IV  40 mg Intravenous QHS   polyethylene glycol  17 g Per Tube Daily   senna-docusate  1 tablet Per Tube BID   Elmarie Shiley, MD 01/30/2023, 9:27 AM

## 2023-01-30 NOTE — Progress Notes (Signed)
NAME:  Angela [name], MRN:  [id number], DOB:  1986-12-09, LOS: 3 ADMISSION DATE:  01/27/2023, CONSULTATION DATE:  2/9 REFERRING MD:  Dr. Rory Percy, CHIEF COMPLAINT:  Altered Mental Status   History of Present Illness:  37 y/o F who presented to [facility] on 2/9 with reports of altered mental status.    The patient was reported at dialysis and after an hour of treatment she had a syncopal episode.  Her eyes rolled back into her head but there was no seizure activity. Staff at HD center were about to start CPR but they had a pulse.  She was hypertensive in the center and was given 0.2 mg clonidine prior to HD.   In ER, patient blood pressure was 207/113, she was started on Cleviprex infusion, after given 30 mg of IV labetalol.  CT head was done which showed left basal ganglia intraparenchymal hemorrhage with extension to IVH, CTA head and neck was negative for aneurysm.  As patient was found obtunded, she was intubated.  PCCM was consulted to help medical management  Pertinent  Medical History  DM I with DM retinopathy , ESRD on HD, Depression, HTN, HLD, Hypothyroidism, Anemia  Significant Hospital Events: Including procedures, antibiotic start and stop dates in addition to other pertinent events   2/09 Admit with ICH, neurosurgery consulted and EVD placed 2/10 hypoglycemic episodes 2/11 off 3% saline 2/12 dialysis today, ?possible extubation after   Interim History / Subjective:   Remains intubated on mechanical vent support. Getting dialysis   Objective   Blood pressure (!) 143/83, pulse 76, temperature 98.1 F (36.7 C), resp. rate 18, height 5' 5"$  (1.651 m), weight 68.3 kg, SpO2 100 %.    Vent Mode: PRVC FiO2 (%):  [40 %] 40 % Set Rate:  [18 bmp] 18 bmp Vt Set:  [450 mL] 450 mL PEEP:  [5 cmH20] 5 cmH20 Plateau Pressure:  [16 cmH20-18 cmH20] 18 cmH20   Intake/Output Summary (Last 24 hours) at 01/30/2023 0857 Last data filed at 01/30/2023 0800 Gross per 24 hour  Intake 1158.36 ml  Output  189 ml  Net 969.36 ml   Filed Weights   01/27/23 1754 01/30/23 0600 01/30/23 0749  Weight: 65.9 kg 68.6 kg 68.3 kg    Examination:  General - sedated on mechanical vent, prop and fent, on dialysis  Eyes - pupils reactive ENT - ETT in place Cardiac - RRR, s1 s2  Chest - BL mechanically ventilated breath sounds  Abdomen - soft nt nd  Extremities - left UE fistula, on dialsysis  Skin - no rash  Neuro - evd in placement, sedated   Resolved Hospital Problem list     Assessment & Plan:   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 P: EVD mtg per neursx  Keppra seizure ppx   HTN emergency. P; Reviewed echo, small effusion BP better controlled nd tolerating dialysis   Acute hypoxemic respiratory failure Compromised airway. 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 Episodes of hypoglycemia noted 2/10 >> resolved 2/11. P: Continue TF  Ssi with cbgs  Holding long acting insulin   Nausea with vomiting. - prn zofran   Hx of hypothyroidism. - continue synthroid   ESRD. - dialysis per nephrology   Anemia of chronic disease. - f/u CBC intermittently -  transfuse for hgb <7   Hx of depression. - continue buspar and lexapro   Best Practice (right click and "Reselect all SmartList Selections" daily)   Diet/type: tubefeeds DVT prophylaxis: SCD GI prophylaxis: PPI Lines: Central line Foley:  N/A Code Status:  full code Last date of multidisciplinary goals of care discussion [I attempted to contact family this AM with no response]  Labs       Latest Ref Rng & Units 01/30/2023    3:57 AM 01/29/2023    3:42 AM 01/28/2023    3:09 PM  CMP  Glucose 70 - 99 mg/dL 158  170  64   BUN 6 - 20 mg/dL 23  23  18   $ Creatinine 0.44 - 1.00 mg/dL 4.62  5.21  4.62    Sodium 135 - 145 mmol/L 137  141  140   Potassium 3.5 - 5.1 mmol/L 4.4  4.4  3.5   Chloride 98 - 111 mmol/L 98  106  105   CO2 22 - 32 mmol/L 26  23  25   $ Calcium 8.9 - 10.3 mg/dL 9.8  9.7  9.4   Total Protein 6.5 - 8.1 g/dL  6.5    Total Bilirubin 0.3 - 1.2 mg/dL  0.5    Alkaline Phos 38 - 126 U/L  174    AST 15 - 41 U/L  115    ALT 0 - 44 U/L  58         Latest Ref Rng & Units 01/30/2023    3:57 AM 01/29/2023    3:42 AM 01/28/2023    3:09 PM  CBC  WBC 4.0 - 10.5 K/uL 14.8  13.0    Hemoglobin 12.0 - 15.0 g/dL 8.1  10.0  8.6   Hematocrit 36.0 - 46.0 % 25.5  30.4  26.7   Platelets 150 - 400 K/uL 103  129      ABG    Component Value Date/Time   PHART 7.443 01/27/2023 1746   PCO2ART 38.3 01/27/2023 1746   PO2ART 551 (H) 01/27/2023 1746   HCO3 26.2 01/27/2023 1746   TCO2 27 01/27/2023 1746   ACIDBASEDEF 6.0 (H) 12/16/2022 1243   O2SAT 100 01/27/2023 1746    CBG (last 3)  Recent Labs    01/29/23 2332 01/30/23 0340 01/30/23 0739  GLUCAP 354* 164* 192*    This patient is critically ill with multiple organ system failure; which, requires frequent high complexity decision making, assessment, support, evaluation, and titration of therapies. This was completed through the application of advanced monitoring technologies and extensive interpretation of multiple databases. During this encounter critical care time was devoted to patient care services described in this note for 32 minutes.  Garner Nash, DO [facility] 01/30/2023 8:57 AM

## 2023-01-30 NOTE — Procedures (Signed)
Patient seen on Hemodialysis. BP 135/83   Pulse 80   Temp 98.1 F (36.7 C)   Resp 18   Ht 5' 5"$  (1.651 m)   Wt 68.3 kg   SpO2 99%   BMI 25.06 kg/m   QB 400, UF goal 2.75L Tolerating treatment without complaints at this time.   Elmarie Shiley MD [facility]. Office # [phone] Pager # [phone] 9:41 AM

## 2023-01-30 NOTE — Progress Notes (Signed)
PT Cancellation Note  Patient Details Name: Angela [name] MRN: [id number] DOB: 18-Dec-1986   Cancelled Treatment:    Reason Eval/Treat Not Completed: Patient at procedure or test/unavailable. Pt under going bedside HD at this time. Acute PT to return as able/as appropriate to complete PT eval.  Kittie Plater, PT, DPT Acute Rehabilitation Services Secure chat preferred Office #: [phone]    Berline Lopes 01/30/2023, 8:29 AM

## 2023-01-30 NOTE — Progress Notes (Signed)
HD treatment done in patient's room,she is intubated and connected to ventilator.Vitals were stable.Patient's mother at the bedside.  Access used : Left upper arm fistula that worked well,two aneurism noted.  Duration of treatment : 3 hours.  Medicine given : None.  Fluid removed: Met prescribed UF goal of 2,800 cc.  HD tx issue: None.She tolerated treatment very well.  Hand off to the patient's nurse.

## 2023-01-30 NOTE — Progress Notes (Signed)
20 mL fentanyl wasted in stericycle with Lupita Raider RN

## 2023-01-30 NOTE — Progress Notes (Cosign Needed)
Assessed patient for extubation. Patient remains sleepy. RT will reassess in a couple of hours. RN aware

## 2023-01-31 ENCOUNTER — Inpatient Hospital Stay (HOSPITAL_COMMUNITY): Payer: Medicaid Other

## 2023-01-31 DIAGNOSIS — I619 Nontraumatic intracerebral hemorrhage, unspecified: Secondary | ICD-10-CM | POA: Diagnosis not present

## 2023-01-31 LAB — GLUCOSE, CAPILLARY
Glucose-Capillary: 367 mg/dL — ABNORMAL HIGH (ref 70–99)
Glucose-Capillary: 350 mg/dL — ABNORMAL HIGH (ref 70–99)
Glucose-Capillary: 148 mg/dL — ABNORMAL HIGH (ref 70–99)
Glucose-Capillary: 28 mg/dL — CL (ref 70–99)
Glucose-Capillary: 33 mg/dL — CL (ref 70–99)
Glucose-Capillary: 109 mg/dL — ABNORMAL HIGH (ref 70–99)
Glucose-Capillary: 85 mg/dL (ref 70–99)
Glucose-Capillary: 88 mg/dL (ref 70–99)

## 2023-01-31 LAB — BASIC METABOLIC PANEL
Anion gap: 16 — ABNORMAL HIGH (ref 5–15)
BUN: 31 mg/dL — ABNORMAL HIGH (ref 6–20)
CO2: 25 mmol/L (ref 22–32)
Calcium: 9.7 mg/dL (ref 8.9–10.3)
Chloride: 92 mmol/L — ABNORMAL LOW (ref 98–111)
Creatinine, Ser: 4.8 mg/dL — ABNORMAL HIGH (ref 0.44–1.00)
GFR, Estimated: 11 mL/min — ABNORMAL LOW (ref >60)
Glucose, Bld: 363 mg/dL — ABNORMAL HIGH (ref 70–99)
Potassium: 4.1 mmol/L (ref 3.5–5.1)
Sodium: 133 mmol/L — ABNORMAL LOW (ref 135–145)

## 2023-01-31 LAB — CBC
HCT: 26.7 % — ABNORMAL LOW (ref 36.0–46.0)
Hemoglobin: 8.2 g/dL — ABNORMAL LOW (ref 12.0–15.0)
MCH: 28.9 pg (ref 26.0–34.0)
MCHC: 30.7 g/dL (ref 30.0–36.0)
MCV: 94 fL (ref 80.0–100.0)
Platelets: 128 10*3/uL — ABNORMAL LOW (ref 150–400)
RBC: 2.84 MIL/uL — ABNORMAL LOW (ref 3.87–5.11)
RDW: 14.5 % (ref 11.5–15.5)
WBC: 14.2 10*3/uL — ABNORMAL HIGH (ref 4.0–10.5)
nRBC: 0 % (ref 0.0–0.2)

## 2023-01-31 LAB — HEPATITIS B SURFACE ANTIBODY, QUANTITATIVE: Hep B S AB Quant (Post): 41.6 m[IU]/mL (ref >9.9)

## 2023-01-31 LAB — TRIGLYCERIDES: Triglycerides: 88 mg/dL (ref <150)

## 2023-01-31 MED ORDER — SODIUM CHLORIDE 0.9 % IV SOLN
12.5000 mg | Freq: Four times a day (QID) | INTRAVENOUS | Status: DC | PRN
  Administered 2023-01-31: 12.5 mg via INTRAVENOUS
  Filled 2023-01-31: qty 12.5

## 2023-01-31 MED ORDER — SODIUM CHLORIDE 0.9 % IV SOLN
2.0000 g | Freq: Two times a day (BID) | INTRAVENOUS | Status: DC
  Administered 2023-01-31 – 2023-02-01 (×3): 2 g via INTRAVENOUS
  Filled 2023-01-31 (×3): qty 20

## 2023-01-31 MED ORDER — DEXTROSE 50 % IV SOLN
INTRAVENOUS | Status: AC
  Filled 2023-01-31: qty 50

## 2023-01-31 MED ORDER — INSULIN ASPART 100 UNIT/ML IJ SOLN
0.0000 [IU] | INTRAMUSCULAR | Status: DC
  Administered 2023-01-31: 15 [IU] via SUBCUTANEOUS
  Administered 2023-01-31: 3 [IU] via SUBCUTANEOUS

## 2023-01-31 MED ORDER — INSULIN GLARGINE-YFGN 100 UNIT/ML ~~LOC~~ SOLN
10.0000 [IU] | Freq: Every day | SUBCUTANEOUS | Status: DC
  Administered 2023-01-31 – 2023-02-01 (×2): 10 [IU] via SUBCUTANEOUS
  Filled 2023-01-31 (×2): qty 0.1

## 2023-01-31 MED ORDER — DEXTROSE 50 % IV SOLN
1.0000 | Freq: Once | INTRAVENOUS | Status: AC
  Administered 2023-01-31: 50 mL via INTRAVENOUS

## 2023-01-31 MED ORDER — INSULIN ASPART 100 UNIT/ML IJ SOLN
0.0000 [IU] | INTRAMUSCULAR | Status: DC
  Administered 2023-02-01: 2 [IU] via SUBCUTANEOUS
  Administered 2023-02-01: 3 [IU] via SUBCUTANEOUS
  Administered 2023-02-01: 2 [IU] via SUBCUTANEOUS
  Administered 2023-02-01 (×2): 1 [IU] via SUBCUTANEOUS
  Administered 2023-02-02: 5 [IU] via SUBCUTANEOUS
  Administered 2023-02-02: 2 [IU] via SUBCUTANEOUS
  Administered 2023-02-02 (×2): 1 [IU] via SUBCUTANEOUS
  Administered 2023-02-02 – 2023-02-03 (×2): 3 [IU] via SUBCUTANEOUS
  Administered 2023-02-03 (×2): 9 [IU] via SUBCUTANEOUS
  Administered 2023-02-04: 1 [IU] via SUBCUTANEOUS
  Administered 2023-02-04: 3 [IU] via SUBCUTANEOUS
  Administered 2023-02-04: 2 [IU] via SUBCUTANEOUS
  Administered 2023-02-04: 1 [IU] via SUBCUTANEOUS

## 2023-01-31 MED ORDER — PROMETHAZINE HCL 25 MG PO TABS: 25.0000 mg | ORAL_TABLET | Freq: Four times a day (QID) | ORAL | Status: DC | PRN

## 2023-01-31 MED ORDER — PROMETHAZINE HCL 25 MG RE SUPP: 25.0000 mg | Freq: Four times a day (QID) | RECTAL | Status: DC | PRN

## 2023-01-31 NOTE — Progress Notes (Signed)
Inpatient Rehab Admissions Coordinator:   Per therapy recommendations pt was screened for CIR by Shann Medal, PT, DPT.  Note pt with EVD at this time.  Will follow from a distance for progress with therapy until EVD able to be d/c'd.  No consult at this time.   Shann Medal, PT, DPT Admissions Coordinator [phone] 01/31/23  3:13 PM

## 2023-01-31 NOTE — Progress Notes (Signed)
Patient ID: Angela [name], female   DOB: 23-Nov-1986, 37 y.o.   MRN: [id number] [facility] Progress Note   Assessment/ Plan:   1.  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  Extubated yesterday with increased responsiveness noted today. 2. ESRD: She is on hemodialysis that we will continue on a Monday/Wednesday/Friday schedule with reassessing needs for extra treatments. 3. Anemia: No overt blood loss noted with acceptable hemoglobin and hematocrit, will continue to follow for ESA requirements. 4. CKD-MBD: Corrected calcium marginally elevated with phosphorus level at goal.  Continue to monitor at this time off of binders. 5.  Acute hypoxic respiratory failure: Secondary to altered mental status in the setting of intracranial bleed.  Remains ventilator dependent. 6. Hypertension: Blood pressures under decent control on oral amlodipine with as needed IV hydralazine/labetalol that has been transitioned from intravenous clevidipine.  Subjective:   Extubated yesterday and appears to have tolerated that well. Problems with hypoglycemia noted overnight.    Objective:   BP 129/73 (BP Location: Right Arm)   Pulse 94   Temp 99.4 F (37.4 C) (Axillary) Comment: ice packs applied  Resp (!) 21   Ht 5' 5"$  (1.651 m)   Wt 65.6 kg   SpO2 98%   BMI 24.07 kg/m   Physical Exam: Gen: Somnolent and appears comfortable CVS: Pulse regular rhythm, normal rate, S1 and S2 normal Resp: Clear to auscultation bilaterally, no rales/rhonchi Abd: Soft, flat, nontender Ext: Left upper arm AV fistula with intact dressings, no lower extremity edema.  trace upper extremity edema noted  Labs: BMET Recent Labs  Lab 01/27/23 1536 01/27/23 1547 01/27/23 1746 01/27/23 2112 01/28/23 0300 01/28/23 0936 01/28/23 1509 01/28/23 1630 01/29/23 0342 01/29/23 1843 01/30/23 0357  01/31/23 0548  NA 134* 135 132* 133* 138  138 140 140  --  141  --  137 133*  K 3.7 3.8 3.6  --  2.9*  --  3.5  --  4.4  --  4.4 4.1  CL 98  --   --   --  101  --  105  --  106  --  98 92*  CO2 24  --   --   --  25  --  25  --  23  --  26 25  GLUCOSE 361*  --   --   --  61*  --  64*  --  170*  --  158* 363*  BUN 14  --   --   --  18  --  18  --  23*  --  23* 31*  CREATININE 3.07*  --   --   --  3.83*  --  4.62*  --  5.21*  --  4.62* 4.80*  CALCIUM 9.2  --   --   --  9.4  --  9.4  --  9.7  --  9.8 9.7  PHOS  --   --   --   --   --  4.8*  --  4.9* 4.7* 4.6  --   --    CBC Recent Labs  Lab 01/27/23 1536 01/27/23 1547 01/28/23 0415 01/28/23 1509 01/29/23 0342 01/30/23 0357 01/31/23 0548  WBC 9.1   < > 7.7  --  13.0* 14.8* 14.2*  NEUTROABS 6.8  --   --   --   --   --   --   HGB 7.5*   < > 6.4* 8.6*  10.0* 8.1* 8.2*  HCT 23.9*   < > 19.6* 26.7* 30.4* 25.5* 26.7*  MCV 93.7   < > 90.3  --  89.7 91.4 94.0  PLT 124*   < > 112*  --  129* 103* 128*   < > = values in this interval not displayed.    Medications:     amLODipine  10 mg Per Tube Daily   busPIRone  15 mg Per Tube QHS   Chlorhexidine Gluconate Cloth  6 each Topical Daily   docusate  100 mg Per Tube BID   escitalopram  20 mg Per Tube Daily   feeding supplement (PROSource TF20)  60 mL Per Tube Daily   heparin injection (subcutaneous)  5,000 Units Subcutaneous Q8H   insulin aspart  0-20 Units Subcutaneous Q4H   insulin glargine-yfgn  10 Units Subcutaneous Daily   levETIRAcetam  250 mg Per Tube Q M,W,F-1800   levothyroxine  175 mcg Per Tube q morning   multivitamin  1 tablet Per Tube QHS   pantoprazole (PROTONIX) IV  40 mg Intravenous QHS   polyethylene glycol  17 g Per Tube Daily   senna-docusate  1 tablet Per Tube BID   Elmarie Shiley, MD 01/31/2023, 9:08 AM

## 2023-01-31 NOTE — Evaluation (Signed)
Occupational Therapy Evaluation Patient Details Name: Angela [name] MRN: [id number] DOB: [birth year] Today's Date: 01/31/2023   History of Present Illness Angela [name] is a 37 y.o. female with history of ESRD on HD, DM, HTN, seizures, HLD, GERD, hypothyroidism, anxiety and depression who presents from dialysis center for evaluation of AMS and syncopal episode.  Left BG ICH with IVH s/p EVD, etiology:  uncontrolled hypertension.   Clinical Impression   Pt currently with functional limitations due to the deficits listed below (see OT Problem List). Eval limited due to cognitive deficits and pt unable to provide background information. No family present. Unknown prior level of function. Currently, pt is requiring overall max-total assist x2 for all BADL tasks and functional transfers. If pt's condition continues to improve and depending on family support available at home, pt would benefit from CIR at discharge prior to returning home. Pt will benefit from skilled OT to increase their safety and independence with ADL and functional mobility for ADL to facilitate discharge to venue listed below. Acute OT will continue to follow and monitor patient's progress.        Recommendations for follow up therapy are one component of a multi-disciplinary discharge planning process, led by the attending physician.  Recommendations may be updated based on patient status, additional functional criteria and insurance authorization.   Follow Up Recommendations  Acute inpatient rehab (3hours/day)     Assistance Recommended at Discharge Frequent or constant Supervision/Assistance  Patient can return home with the following Two people to help with walking and/or transfers;Two people to help with bathing/dressing/bathroom;Assistance with cooking/housework;Assistance with feeding;Help with stairs or ramp for entrance;Assist for transportation;Direct supervision/assist for financial management;Direct supervision/assist  for medications management    Functional Status Assessment  Patient has had a recent decline in their functional status and demonstrates the ability to make significant improvements in function in a reasonable and predictable amount of time.  Equipment Recommendations  Other (comment) (TBD)    Recommendations for Other Services Rehab consult     Precautions / Restrictions Precautions Precautions: Fall Precaution Comments: EVD drain, mitts and wrist restraints Restrictions Weight Bearing Restrictions: No      Mobility Bed Mobility Overal bed mobility: Needs Assistance Bed Mobility: Rolling, Sidelying to Sit Rolling: Supervision Sidelying to sit: +2 for physical assistance, Max assist       General bed mobility comments: once arms untied, rolled to her side on her own as if to reposition for sleep, assist for legs off bed and lifting trunk Patient Response: Flat affect, Restless  Transfers Overall transfer level: Needs assistance Equipment used: 2 person hand held assist Transfers: Sit to/from Stand, Bed to chair/wheelchair/BSC Sit to Stand: Max assist, +2 physical assistance Stand pivot transfers: Max assist, +2 physical assistance         General transfer comment: assist with bilateral support to lift to standing and pt flexed at hips, but knees holding some facilitation for R weight shift and cue for L step forward to pivot to chair which pt initiated, but not enough to pivot so assisted to pivot to sit in chair      Balance Overall balance assessment: Needs assistance Sitting-balance support: Feet unsupported, No upper extremity supported Sitting balance-Leahy Scale: Poor Sitting balance - Comments: placed pt hands on bed for self support, unable to maintain placing hands back in her lap, mostly mod support for balance with feet unsupported, but managed to balance briefly (~10s or less) unsupported.     Standing balance-Leahy Scale: Zero Standing balance  comment:  +2 for standing today         ADL either performed or assessed with clinical judgement   ADL Overall ADL's : Needs assistance/impaired Eating/Feeding: NPO   Grooming: Wash/dry face;Wash/dry hands;Total assistance;Bed level   Upper Body Bathing: Total assistance;Bed level   Lower Body Bathing: Total assistance;+2 for physical assistance;Bed level   Upper Body Dressing : Total assistance;Bed level   Lower Body Dressing: Total assistance;+2 for physical assistance;Bed level   Toilet Transfer: Maximal assistance;+2 for physical assistance;Stand-pivot Toilet Transfer Details (indicate cue type and reason): simulated to recliner Toileting- Clothing Manipulation and Hygiene: Total assistance;+2 for physical assistance;+2 for safety/equipment;Sit to/from stand               Vision Baseline Vision/History:  (UTA) Ability to See in Adequate Light:  (Unknown. no family in room.) Patient Visual Report: Other (comment) (Pt did not verbalize) Additional Comments: Pt unable to participate in vision assessment due to decreased alertness, cognition. Pt did present with a left gaze preference initially. Was able to track to right side during session.            Pertinent Vitals/Pain Pain Assessment Pain Assessment: Faces Faces Pain Scale: Hurts a little bit Pain Location: right knee with mobility Pain Descriptors / Indicators: Grimacing Pain Intervention(s): Monitored during session, Limited activity within patient's tolerance, Repositioned     Hand Dominance  (unknown)   Extremity/Trunk Assessment Upper Extremity Assessment Upper Extremity Assessment: Generalized weakness;Difficult to assess due to impaired cognition (Pt with intermittent non goal directed UE movement. Did demonstrate decreased gross grasp bilaterally.)   Lower Extremity Assessment Lower Extremity Assessment: Difficult to assess due to impaired cognition;Generalized weakness   Cervical / Trunk  Assessment Cervical / Trunk Assessment: Normal   Communication Communication Communication: Expressive difficulties;Receptive difficulties (partly due to lethargy)   Cognition Arousal/Alertness: Lethargic Behavior During Therapy: Flat affect Overall Cognitive Status: Impaired/Different from baseline Area of Impairment: Following commands, Attention, Problem solving     Following Commands: Follows one step commands inconsistently, Follows one step commands with increased time     Problem Solving: Slow processing, Decreased initiation, Difficulty sequencing, Requires tactile cues, Requires verbal cues General Comments: very slow to respond to commands initially, but improved some with arousal and mobility, but still only intermittent following commands     General Comments  EVD on right side. BP: 169/90 supine, 134/93 sitting            Home Living Family/patient expects to be discharged to:: Unsure Living Arrangements: Other (Comment)         Additional Comments: pt unable to state, unable to reach primary contacts by phone      Prior Functioning/Environment Prior Level of Function :  (unknown)                  OT Problem List: Decreased strength;Decreased coordination;Pain;Decreased cognition;Decreased activity tolerance;Decreased safety awareness;Impaired balance (sitting and/or standing);Decreased knowledge of use of DME or AE;Impaired vision/perception;Decreased knowledge of precautions;Impaired UE functional use      OT Treatment/Interventions: Self-care/ADL training;Splinting;Therapeutic activities;Therapeutic exercise;Neuromuscular education;Visual/perceptual remediation/compensation;Energy conservation;DME and/or AE instruction;Balance training;Manual therapy;Patient/family education;Modalities    OT Goals(Current goals can be found in the care plan section) Acute Rehab OT Goals Patient Stated Goal: none stated OT Goal Formulation: Patient unable to  participate in goal setting Time For Goal Achievement: 02/14/23 Potential to Achieve Goals: Good  OT Frequency: Min 2X/week    Co-evaluation PT/OT/SLP Co-Evaluation/Treatment: Yes Reason for Co-Treatment: Complexity of the patient's impairments (multi-system  involvement);To address functional/ADL transfers;For patient/therapist safety;Necessary to address cognition/behavior during functional activity PT goals addressed during session: Mobility/safety with mobility;Balance;Strengthening/ROM OT goals addressed during session: ADL's and self-care;Strengthening/ROM;Proper use of Adaptive equipment and DME      AM-PAC OT "6 Clicks" Daily Activity     Outcome Measure Help from another person eating meals?: Total Help from another person taking care of personal grooming?: Total Help from another person toileting, which includes using toliet, bedpan, or urinal?: Total Help from another person bathing (including washing, rinsing, drying)?: Total Help from another person to put on and taking off regular upper body clothing?: Total Help from another person to put on and taking off regular lower body clothing?: Total 6 Click Score: 6   End of Session Equipment Utilized During Treatment: Gait belt Nurse Communication: Mobility status  Activity Tolerance: Patient limited by lethargy Patient left: in chair;with call bell/phone within reach;with chair alarm set  OT Visit Diagnosis: Unsteadiness on feet (R26.81);Muscle weakness (generalized) (M62.81)                Time: 0935-1000 OT Time Calculation (min): 25 min Charges:  OT General Charges $OT Visit: 1 Visit OT Evaluation $OT Eval High Complexity: 1 High  [employer], OTR/L,CBIS  Supplemental OT - [facility] and [facility] Secure Chat Preferred    Magdaline Zollars, Clarene Duke 01/31/2023, 1:33 PM

## 2023-01-31 NOTE — Progress Notes (Signed)
SLP Cancellation Note  Patient Details Name: Angela [name] MRN: [id number] DOB: [birth year]   Cancelled treatment:        Orders for clinical swallow evaluation received and appreciated.  Pt lethargic on SLP arrival.  Spoke with nursing.  Evaluation held at this time 2/2 decreased ability to participate.  SLP to follow for readiness for swallowing and cognitive linguistic assessment.   Celedonio Savage, MA, [place] Office: [phone] 01/31/2023, 10:20 AM

## 2023-01-31 NOTE — Progress Notes (Signed)
Nutrition Follow-up  DOCUMENTATION CODES:   Not applicable  INTERVENTION:  Once Cortrak tube placed and confirmed: -Initiate Vital 1.5 at 20 mL/hour and advance by 10 mL/hour every 8 hours to goal rate of 50 mL/hour (1200 mL goal daily volume) -Provide PROSource TF20 60 mL daily per tube -Goal regimen provides: 1880 kcal, 101 grams of protein, 912 mL H2O daily  As tube feeds are advanced, monitor magnesium, phosphorus, and potassium BID for 4 occurrences, MD to replete as needed, as pt is at risk for refeeding syndrome.  Once Cortrak tube placed and confirmed, resume Rena-vite QHS per tube.  NUTRITION DIAGNOSIS:   Inadequate oral intake related to inability to eat as evidenced by NPO status.  Ongoing.  GOAL:   Patient will meet greater than or equal to 90% of their needs  Not met at this time.  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  2/9: intubated; s/p placement of ventriculostomy drain 2/10: tube feeds initiated;  overnight pt had emesis so tube feeds were stopped 2/11: tube feeds restarted at 10 mL/hour 2/12: underwent HD with 2.8 L removed; extubated and OG tube removed 2/13: pt too lethargic for SLP evaluation  Met with pt at bedside. No family members present at time of RD assessment. Pt with bilateral wrist restraints at time of RD assessment. Per review of chart able to intermittently follow commands. Discussed with RN. Pt unable to have swallow evaluation today. Plan is to place Cortrak tube tomorrow. Discussed with MD via secure chat. Order for Cortrak  tube placed. Per review of chart after tube feeds were resumed at 10 mL/hour, they got up to 30 mL/hour before being stopped for extubation. Noted pt had a medium type 6 BM (first BM this admission).  Admission wt was 65.9 kg on 01/27/23. Pt was 65.6 kg on 2/12 after HD. EDW is 62 kg. Will continue to monitor trends.  Medications reviewed and include: Colace (unable to receive), PROSource TF20 60 mL daily (unable to receive), Novolog 0-9 units Q4hrs, Semglee 10 units daily, Keppra every Monday/Wednesday/Friday, levothyroxine, Rena-vite QHS (unable to receive), pantoprazole, Miralax (unable to receive), senna-docusate (unable to receive), ceftriaxone, Phenergan IV  Labs reviewed: CBG 148-367, Sodium 133, Chloride 92, BUN 31, Creatinine 4.8  UOP: 2 occurrences unmeasured UOP Ventriculostomy: 275 mL output  I/O: -1194 mL since admission  Findings on NFPE concerning for mild-moderate muscle depletions. Patient is at risk for malnutrition.  NUTRITION - FOCUSED PHYSICAL EXAM: Flowsheet Row Most Recent Value  Orbital Region No depletion  Upper Arm Region Mild depletion  Thoracic and Lumbar Region No depletion  Buccal Region No depletion  Temple Region No depletion  Clavicle Bone Region Mild depletion  Clavicle and Acromion Bone Region Moderate depletion  Scapular Bone Region Mild depletion  Dorsal Hand Moderate depletion  Patellar Region Severe depletion  Anterior Thigh Region Moderate depletion  Posterior Calf Region Moderate depletion  Edema (RD Assessment) None  Hair Reviewed  Eyes Reviewed  Mouth Reviewed  Skin Reviewed  Nails Reviewed       Diet Order:   Diet Order  Diet NPO time specified  Diet effective now                  EDUCATION NEEDS:   No education needs have been identified at this time  Skin:  Skin Assessment: Reviewed RN Assessment  Last BM:  01/31/23 - medium type 6  Height:   Ht Readings from Last 1 Encounters:  01/27/23 5' 5"$  (1.651  m)   Weight:   Wt Readings from Last 1 Encounters:  01/30/23 65.6 kg   Ideal Body Weight:  56.8 kg  BMI:  Body mass index is 24.07 kg/m.  Estimated Nutritional Needs:   Kcal:  1800-2000  Protein:  90-100 grams  Fluid:  UOP + 1 L  Nelson Noone Magda Paganini, MS, RD, LDN, CNSC Pager number available on Amion

## 2023-01-31 NOTE — Progress Notes (Signed)
Pt receives out-pt HD at [facility] in [facility] on MWF. Pt has a 10:55 chair time. Will assist as needed.   Melven Sartorius Renal Navigator [phone]

## 2023-01-31 NOTE — Progress Notes (Signed)
Inpatient Diabetes Program Recommendations  AACE/ADA: New Consensus Statement on Inpatient Glycemic Control (2015)  Target Ranges:  Prepandial:   less than 140 mg/dL      Peak postprandial:   less than 180 mg/dL (1-2 hours)      Critically ill patients:  140 - 180 mg/dL   Lab Results  Component Value Date   GLUCAP 148 (H) 01/31/2023   HGBA1C 12.0 (H) 01/28/2023    Review of Glycemic Control  Latest Reference Range & Units 01/30/23 03:40 01/30/23 07:39 01/30/23 11:40 01/30/23 15:53 01/30/23 19:42 01/30/23 23:22 01/31/23 03:53 01/31/23 07:47 01/31/23 11:53  Glucose-Capillary 70 - 99 mg/dL 164 (H) 192 (H) 128 (H) 364 (H) 331 (H) 339 (H) 367 (H) 350 (H) 148 (H)   Diabetes history: Type 1 DM Outpatient Diabetes medications:  Novolog 2-8 units tid with meals Tresiba 12 units daily Current orders for Inpatient glycemic control:  Novolog 0-20 units q 4 hours Semglee 10 units daily Inpatient Diabetes Program Recommendations:    Please reduce Novolog correction to sensitive (0-9 units) q 4 hours due to history of Type 1 DM and ESRD.   Thanks,  Adah Perl, RN, BC-ADM Inpatient Diabetes Coordinator Pager [phone]  (8a-5p)

## 2023-01-31 NOTE — Progress Notes (Addendum)
STROKE TEAM PROGRESS NOTE   INTERVAL HISTORY Patient is seen in her room with no family at the bedside.  She was extubated yesterday and is doing well.  She can intermittently follow commands.  Will have swallow evaluation today.  EVD remains in place, draining well at 15 cm of water.BP adequately controlled  Vitals:   01/31/23 0800 01/31/23 0900 01/31/23 1000 01/31/23 1100  BP: 129/73 (!) 152/80 134/83 (!) 155/85  Pulse: 94 95 92 93  Resp: (!) 21 19 (!) 21 20  Temp: 99.5 F (37.5 C)     TempSrc: Axillary     SpO2: 98% 99% 100% 100%  Weight:      Height:       CBC:  Recent Labs  Lab 01/27/23 1536 01/27/23 1547 01/30/23 0357 01/31/23 0548  WBC 9.1   < > 14.8* 14.2*  NEUTROABS 6.8  --   --   --   HGB 7.5*   < > 8.1* 8.2*  HCT 23.9*   < > 25.5* 26.7*  MCV 93.7   < > 91.4 94.0  PLT 124*   < > 103* 128*   < > = values in this interval not displayed.    Basic Metabolic Panel:  Recent Labs  Lab 01/29/23 0342 01/29/23 1843 01/30/23 0357 01/31/23 0548  NA 141  --  137 133*  K 4.4  --  4.4 4.1  CL 106  --  98 92*  CO2 23  --  26 25  GLUCOSE 170*  --  158* 363*  BUN 23*  --  23* 31*  CREATININE 5.21*  --  4.62* 4.80*  CALCIUM 9.7  --  9.8 9.7  MG 2.0 1.8 1.8  --   PHOS 4.7* 4.6  --   --     Lipid Panel:  Recent Labs  Lab 01/28/23 0300 01/30/23 0357 01/31/23 0548  CHOL 247*  --   --   TRIG 65  64   < > 88  HDL 119  --   --   CHOLHDL 2.1  --   --   VLDL 13  --   --   LDLCALC 115*  --   --    < > = values in this interval not displayed.    HgbA1c:  Recent Labs  Lab 01/28/23 0300  HGBA1C 12.0*    Urine Drug Screen:  Recent Labs  Lab 01/28/23 0715  LABOPIA NONE DETECTED  COCAINSCRNUR NONE DETECTED  LABBENZ NONE DETECTED  AMPHETMU NONE DETECTED  THCU POSITIVE*  LABBARB NONE DETECTED     Alcohol Level  Recent Labs  Lab 01/27/23 1833  ETH <10     IMAGING past 24 hours CT HEAD WO CONTRAST (5MM)  Result Date: 01/31/2023 CLINICAL DATA:   Stroke, follow-up EXAM: CT HEAD WITHOUT CONTRAST TECHNIQUE: Contiguous axial images were obtained from the base of the skull through the vertex without intravenous contrast. RADIATION DOSE REDUCTION: This exam was performed according to the departmental dose-optimization program which includes automated exposure control, adjustment of the mA and/or kV according to patient size and/or use of iterative reconstruction technique. COMPARISON:  01/30/2023 FINDINGS: Brain: Right frontal approach ventriculostomy catheter terminates in the right frontal horn, unchanged. Further slight interval decrease in the size of the ventricles, which is most notable when comparing the size of left temporal horn to the prior exam. The volume of intraventricular hemorrhage appears unchanged. Redemonstrated parenchymal hemorrhage left caudate, which measures approximately 2.9 x 2.0 x 2.5 cm (AP  x TR x CC) (series 3, image 15 and series 5, image 27), previously 3.0 x 2.0 x 2.4 cm, overall unchanged. Unchanged mass effect on the lateral ventricles, with 6 mm of left-to-right midline shift at the level the septum pellucidum, unchanged. No acute infarct.  Gray-white differentiation is preserved. Vascular: No hyperdense vessel. Atherosclerotic calcifications in the intracranial carotid and vertebral arteries. Skull: Right frontal burr hole.  No acute fracture or focal lesion. Sinuses/Orbits: Near complete opacification of the right maxillary sinus. Small air-fluid level in the left maxillary sinus. Additional air-fluid levels in the bilateral ethmoid air cells, bilateral sphenoid sinuses, and right frontal sinus. Other: The mastoids are well aerated. IMPRESSION: 1. Further slight interval decrease in the size of the ventricles, which is most notable when comparing the size of the left temporal horn to the prior exam. The volume of intraventricular hemorrhage appears unchanged. 2. Unchanged parenchymal hemorrhage centered in the left caudate,  with 6 mm of left-to-right midline shift. Electronically Signed   By: Merilyn Baba M.D.   On: 01/31/2023 03:08    PHYSICAL EXAM  Physical Exam  Constitutional: Appears well-developed and well-nourished middle-age African-American lady with ventriculostomy.   Cardiovascular: Normal rate and regular rhythm.  Respiratory: Regular, unlabored respirations  Neuro: Mental Status: Patient is drowsy but arouses easily on stimulation nonverbal but will intermittently follow commands Cranial Nerves: Pupils equal round reactive to light, extraocular movements intact, face appears symmetrical, tongue protrusion not cooperative Motor/sensory Patient moves bilateral upper extremities spontaneously with good antigravity strength and withdraws bilateral lower extremities to noxious stimuli  ASSESSMENT/PLAN [name] is a 37 y.o. female with history of ESRD on HD, DM, HTN, seizures, HLD, GERD, hypothyroidism, anxiety and depression who presents from dialysis center for evaluation of AMS and syncopal episode.  EVD was placed for hydrocephalus.  She was extubated on 2/12.  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 CTA head & neck unremarkable 2/10 - Repeat Head CT- s/p EVD. Overall ventricular size is similar with persistent mild hydrocephalus. Stable hematoma 2/12- CT head- 1. Stable right frontal approach EVD with ventricular decompression since 01/27/2023. Left caudate intra-axial hemorrhage suspected with secondary IVH. Estimated intra-axial blood volume of 5-6 mL, stable to mildly decreased since 01/27/2023. IVH not significantly changed. No new intracranial abnormality. 2D Echo 60-65%, left atrium is moderately dilated LDL 115 HgbA1c 12.0 UDS positive for THC VTE  prophylaxis - heparin subq No antithrombotic prior to admission, now on No antithrombotic.  Therapy recommendations: Pending Disposition: Pending  Acute Hypoxic Respiratory Failure Intubated 2/9 CCM management for ventilator  On prop and fentanyl gtts Goal SpO2 greater than 92%  Hydrocephalus  HTS at 40 -> switch to D10NS -> off  Na 133->138->140->141->133 EVD placed 2/9 by NSGY 10cmH2O->17cmH2O-> 15 cmH2O  Seizure disorder 09/2021 seizure triggered by hypoglycemia, on Keppra Home meds Keppra 500 mg BID (changed from home dose of daily) daily with 249m additional on dialysis days Not sure compliance Reported frequent seizure at home and with HD EEG 2/10:  Continuous slow, generalized, no seizures   Diabetes type I Uncontrolled Hypoglycemia  D10 d/c'd  SSI TF's restarted after having vomiting overnight  Home meds: Insulin HgbA1c 12.0, goal < 7.0 CBGs  Hypertensive emergency Home meds: Norvasc, Cardizem, furosemide, hydralazine, losartan Cleviprex turned  off this am Resume home norvasc 10 Stable now BP goal < 160  Hyperlipidemia Home meds pravastatin 20 LDL 115, goal < 70 AST/ALT 63/45 -> 115/58 Hold off statin due to acute ICH and transaminitis Consider resume statin at discharge  Other Stroke Risk Factors Obesity, Body mass index is 24.07 kg/m., BMI >/= 30 associated with increased stroke risk, recommend weight loss, diet and exercise as appropriate  THC abuse, cessation education will be provided Migraine - followed with Dr. Jaynee Eagles at [facility]  Other Active Problems Hypokalemia, resolved  Replete and repeat BMP, check mag K 4.4 Severe anemia  Hgb 7.5-8.5-6.4 -> 1uPRBC 2/10, ->10.0 Repeat H/H ESRD with HD MWF HD done 2/9 only received 1 hour her 4 hour session Per her mother she had 5 dialysis sessions last week Received HD yesterday 2/10 Nephrology consulted by CCM  Hypothyroidism TSH 201.471 - continue Synthroid Free T4 - 0.77 Anxiety,  depression BuSpar, Lexapro Leukocytosis WBC 14.8 CXR - left lower lobe atelectasis  Afebrile  Hospital day # 4  Patient seen and examined by NP/APP with MD. MD to update note as needed.   [place] , MSN, AGACNP-BC [facility] See Amion for schedule and pager information 01/31/2023 11:44 AM   I have personally obtained history,examined this patient, reviewed notes, independently viewed imaging studies, participated in medical decision making and plan of care.ROS completed by me personally and pertinent positives fully documented  I have made any additions or clarifications directly to the above note. Agree with note above. Continue strict BP control with SBP goal below 160. Mobilize. Therapy consults. Swallow eval by ST, Continue EVD drainage as per neurosurgery This patient is critically ill and at significant risk of neurological worsening, death and care requires constant monitoring of vital signs, hemodynamics,respiratory and cardiac monitoring, extensive review of multiple databases, frequent neurological assessment, discussion with family, other specialists and medical decision making of high complexity.I have made any additions or clarifications directly to the above note.This critical care time does not reflect procedure time, or teaching time or supervisory time of PA/NP/Med Resident etc but could involve care discussion time.  I spent 30 minutes of neurocritical care time  in the care of  this patient.      Antony Contras, MD Medical Director [place] Pager: [phone] 01/31/2023 12:17 PM

## 2023-01-31 NOTE — Evaluation (Signed)
Physical Therapy Evaluation Patient Details Name: Angela [name] MRN: [id number] DOB: 02/05/1986 Today's Date: 01/31/2023  History of Present Illness  Angela [name] is a 37 y.o. female with history of ESRD on HD, DM, HTN, seizures, HLD, GERD, hypothyroidism, anxiety and depression who presents from dialysis center for evaluation of AMS and syncopal episode.  Left BG ICH with IVH s/p EVD, etiology:  uncontrolled hypertension.  Clinical Impression  Patient presents with decreased mobility due to deficits listed in PT problem list.  Currently +2 A with lethargy impairing attention, awareness and command following.  She has mother, brother and significant other listed as primary contacts in chart, but unreachable this am.  PT will continue to follow.  Hopeful she has assistance available in which case she can benefit from acute inpatient rehab prior to d/c home.        Recommendations for follow up therapy are one component of a multi-disciplinary discharge planning process, led by the attending physician.  Recommendations may be updated based on patient status, additional functional criteria and insurance authorization.  Follow Up Recommendations Acute inpatient rehab (3hours/day)      Assistance Recommended at Discharge Frequent or constant Supervision/Assistance  Patient can return home with the following  Two people to help with walking and/or transfers;Assistance with cooking/housework;Assist for transportation;Direct supervision/assist for medications management;Help with stairs or ramp for entrance;Two people to help with bathing/dressing/bathroom    Equipment Recommendations Other (comment) (TBA at next level of care)  Recommendations for Other Services  Rehab consult    Functional Status Assessment Patient has had a recent decline in their functional status and demonstrates the ability to make significant improvements in function in a reasonable and predictable amount of time.      Precautions / Restrictions Precautions Precautions: Fall Precaution Comments: EVD drain, mitts and wrist restraints Restrictions Weight Bearing Restrictions: No      Mobility  Bed Mobility Overal bed mobility: Needs Assistance Bed Mobility: Rolling, Sidelying to Sit Rolling: Supervision Sidelying to sit: +2 for physical assistance, Max assist       General bed mobility comments: once arms untied, rolled to her side on her own as if to reposition for sleep, assist for legs off bed and lifting trunk    Transfers Overall transfer level: Needs assistance Equipment used: 2 person hand held assist Transfers: Sit to/from Stand, Bed to chair/wheelchair/BSC Sit to Stand: Max assist, +2 physical assistance Stand pivot transfers: Max assist, +2 physical assistance         General transfer comment: assist with bilateral support to lift to standing and pt flexed at hips, but knees holding some facilitation for R weight shift and cue for L step forward to pivot to chair which pt initiated, but not enough to pivot so assisted to pivot to sit in chair    Ambulation/Gait               General Gait Details: unable  Stairs            Wheelchair Mobility    Modified Rankin (Stroke Patients Only) Modified Rankin (Stroke Patients Only) Pre-Morbid Rankin Score: No symptoms (assumed) Modified Rankin: Severe disability     Balance Overall balance assessment: Needs assistance Sitting-balance support: Feet unsupported, No upper extremity supported Sitting balance-Leahy Scale: Poor Sitting balance - Comments: placed pt hands on bed for self support, unable to maintain placing hands back in her lap, mostly mod support for balance with feet unsupported, but managed to balance briefly (~10s or less) unsupported.  Standing balance-Leahy Scale: Zero Standing balance comment: +2 for standing today                             Pertinent Vitals/Pain Pain  Assessment Pain Assessment: Faces Faces Pain Scale: Hurts little more Pain Location: R LE with knee extension Pain Descriptors / Indicators: Grimacing Pain Intervention(s): Monitored during session, Limited activity within patient's tolerance, Repositioned    Home Living Family/patient expects to be discharged to:: Unsure Living Arrangements: Other (Comment)                 Additional Comments: pt unable to state, unable to reach primary contacts by phone    Prior Function Prior Level of Function :  (unknown)                     Hand Dominance   Dominant Hand:  (unknown)    Extremity/Trunk Assessment   Upper Extremity Assessment Upper Extremity Assessment: Defer to OT evaluation    Lower Extremity Assessment Lower Extremity Assessment: Difficult to assess due to impaired cognition;Generalized weakness    Cervical / Trunk Assessment Cervical / Trunk Assessment: Normal  Communication   Communication: Expressive difficulties;Receptive difficulties (partly due to lethargy)  Cognition Arousal/Alertness: Lethargic Behavior During Therapy: Flat affect Overall Cognitive Status: Impaired/Different from baseline Area of Impairment: Following commands, Attention, Problem solving                   Current Attention Level: Focused   Following Commands: Follows one step commands inconsistently, Follows one step commands with increased time     Problem Solving: Slow processing, Decreased initiation, Difficulty sequencing, Requires tactile cues, Requires verbal cues General Comments: very slow to respond to commands initially, but improved some with arousal and mobility, but still only intermittent following commands        General Comments General comments (skin integrity, edema, etc.): Pt with EVD on R side, some L gaze preference noted initially, can track to R as well, but not as briskly; quickly back to sleep in recliner; BP initially <160, RN delivered  meds and follow up SBP 134    Exercises     Assessment/Plan    PT Assessment Patient needs continued PT services  PT Problem List Decreased strength;Decreased balance;Decreased cognition;Decreased knowledge of use of DME;Decreased mobility;Decreased activity tolerance;Decreased safety awareness       PT Treatment Interventions DME instruction;Functional mobility training;Balance training;Patient/family education;Therapeutic activities;Gait training;Therapeutic exercise;Cognitive remediation;Stair training    PT Goals (Current goals can be found in the Care Plan section)  Acute Rehab PT Goals Patient Stated Goal: unable to state PT Goal Formulation: Patient unable to participate in goal setting Time For Goal Achievement: 02/14/23 Potential to Achieve Goals: Good    Frequency Min 4X/week     Co-evaluation PT/OT/SLP Co-Evaluation/Treatment: Yes Reason for Co-Treatment: Complexity of the patient's impairments (multi-system involvement);To address functional/ADL transfers;For patient/therapist safety;Necessary to address cognition/behavior during functional activity PT goals addressed during session: Mobility/safety with mobility;Balance;Strengthening/ROM OT goals addressed during session: ADL's and self-care;Strengthening/ROM;Proper use of Adaptive equipment and DME       AM-PAC PT "6 Clicks" Mobility  Outcome Measure Help needed turning from your back to your side while in a flat bed without using bedrails?: A Lot Help needed moving from lying on your back to sitting on the side of a flat bed without using bedrails?: Total Help needed moving to and from a bed to a chair (  including a wheelchair)?: Total Help needed standing up from a chair using your arms (e.g., wheelchair or bedside chair)?: Total Help needed to walk in hospital room?: Total Help needed climbing 3-5 steps with a railing? : Total 6 Click Score: 7    End of Session Equipment Utilized During Treatment: Gait  belt;Oxygen Activity Tolerance: Patient limited by lethargy Patient left: in chair;with chair alarm set;with restraints reapplied;with call bell/phone within reach Nurse Communication: Mobility status PT Visit Diagnosis: Other abnormalities of gait and mobility (R26.89);Muscle weakness (generalized) (M62.81);Other symptoms and signs involving the nervous system (R29.898)    Time: [id number] PT Time Calculation (min) (ACUTE ONLY): 25 min   Charges:   PT Evaluation $PT Eval Moderate Complexity: 1 Mod          Cyndi Kyasia Steuck, PT Acute Rehabilitation Services Office:[phone] 01/31/2023   Reginia Naas 01/31/2023, 10:39 AM

## 2023-01-31 NOTE — Progress Notes (Signed)
Subjective: Patient doing well, extubated yesterday. FC intermittently  Objective: Vital signs in last 24 hours: Temp:  [97.9 F (36.6 C)-99.4 F (37.4 C)] 99.4 F (37.4 C) (02/13 0400) Pulse Rate:  [76-110] 95 (02/13 0700) Resp:  [0-24] 17 (02/13 0700) BP: (110-178)/(68-94) 148/90 (02/13 0700) SpO2:  [91 %-100 %] 100 % (02/13 0700) FiO2 (%):  [30 %] 30 % (02/12 1312) Weight:  [65.6 kg] 65.6 kg (02/12 1142)  Intake/Output from previous day: 02/12 0701 - 02/13 0700 In: 446.2 [I.V.:143.7; NG/GT:202.5; IV Piggyback:100] Out: 3075 [Drains:275] Intake/Output this shift: No intake/output data recorded.  Neuro: MAE well, FC intermittently, still not conversing  Lab Results: Lab Results  Component Value Date   WBC 14.2 (H) 01/31/2023   HGB 8.2 (L) 01/31/2023   HCT 26.7 (L) 01/31/2023   MCV 94.0 01/31/2023   PLT 128 (L) 01/31/2023   Lab Results  Component Value Date   INR 0.96 09/21/2017   BMET Lab Results  Component Value Date   NA 133 (L) 01/31/2023   K 4.1 01/31/2023   CL 92 (L) 01/31/2023   CO2 25 01/31/2023   GLUCOSE 363 (H) 01/31/2023   BUN 31 (H) 01/31/2023   CREATININE 4.80 (H) 01/31/2023   CALCIUM 9.7 01/31/2023    Studies/Results: CT HEAD WO CONTRAST (5MM)  Result Date: 01/31/2023 CLINICAL DATA:  Stroke, follow-up EXAM: CT HEAD WITHOUT CONTRAST TECHNIQUE: Contiguous axial images were obtained from the base of the skull through the vertex without intravenous contrast. RADIATION DOSE REDUCTION: This exam was performed according to the departmental dose-optimization program which includes automated exposure control, adjustment of the mA and/or kV according to patient size and/or use of iterative reconstruction technique. COMPARISON:  01/30/2023 FINDINGS: Brain: Right frontal approach ventriculostomy catheter terminates in the right frontal horn, unchanged. Further slight interval decrease in the size of the ventricles, which is most notable when comparing the size  of left temporal horn to the prior exam. The volume of intraventricular hemorrhage appears unchanged. Redemonstrated parenchymal hemorrhage left caudate, which measures approximately 2.9 x 2.0 x 2.5 cm (AP x TR x CC) (series 3, image 15 and series 5, image 27), previously 3.0 x 2.0 x 2.4 cm, overall unchanged. Unchanged mass effect on the lateral ventricles, with 6 mm of left-to-right midline shift at the level the septum pellucidum, unchanged. No acute infarct.  Gray-white differentiation is preserved. Vascular: No hyperdense vessel. Atherosclerotic calcifications in the intracranial carotid and vertebral arteries. Skull: Right frontal burr hole.  No acute fracture or focal lesion. Sinuses/Orbits: Near complete opacification of the right maxillary sinus. Small air-fluid level in the left maxillary sinus. Additional air-fluid levels in the bilateral ethmoid air cells, bilateral sphenoid sinuses, and right frontal sinus. Other: The mastoids are well aerated. IMPRESSION: 1. Further slight interval decrease in the size of the ventricles, which is most notable when comparing the size of the left temporal horn to the prior exam. The volume of intraventricular hemorrhage appears unchanged. 2. Unchanged parenchymal hemorrhage centered in the left caudate, with 6 mm of left-to-right midline shift. Electronically Signed   By: Merilyn Baba M.D.   On: 01/31/2023 03:08   CT HEAD WO CONTRAST (5MM)  Result Date: 01/30/2023 CLINICAL DATA:  37 year old female dialysis patient who presented with altered mental status and hemorrhage into the ventricular system on 01/27/2023. Status post EVD. EXAM: CT HEAD WITHOUT CONTRAST TECHNIQUE: Contiguous axial images were obtained from the base of the skull through the vertex without intravenous contrast. RADIATION DOSE REDUCTION: This exam  was performed according to the departmental dose-optimization program which includes automated exposure control, adjustment of the mA and/or kV  according to patient size and/or use of iterative reconstruction technique. COMPARISON:  Head CTs 01/27/2023 and earlier. FINDINGS: Brain: Right anterior frontal approach ventriculostomy catheter terminates in the frontal horn as before. Decreased ventricle size since 2323 hours on 01/27/2023. Volume of intraventricular hemorrhage appears stable., and the studies suggest intra-axial blood thin the left caudate head also, probably the hemorrhage origin. Intra-axial blood products size and configuration stable to slightly smaller, estimated intra-axial volume 5-6 mL. Stable regional mass effect including mild rightward midline shift at the anterior septum pellucidum. Stable gray-white differentiation elsewhere. No new intracranial hemorrhage or ischemia identified. Vascular: Calcified atherosclerosis at the skull base. Skull: Stable.  No acute osseous abnormality identified. Sinuses/Orbits: Fluid in the visible pharynx. Patient is intubated on portable chest X am yesterday. Bilateral paranasal sinus fluid levels and opacification. Tympanic cavities and mastoids remain clear. Other: Broad-based right scalp hematoma superimposed on recent postoperative changes from external ventricular drain. Orbits soft tissues appear to remain negative. IMPRESSION: 1. Stable right frontal approach EVD with ventricular decompression since 01/27/2023. 2. Left caudate intra-axial hemorrhage suspected with secondary IVH. Estimated intra-axial blood volume of 5-6 mL, stable to mildly decreased since 01/27/2023. IVH not significantly changed. 3. No new intracranial abnormality. 4. Intubated with fluid in the pharynx and paranasal sinuses. Electronically Signed   By: [name] M.D.   On: 01/30/2023 05:57    Assessment/Plan: Extubated yesterday and doing well. We will continue EVD for now. CT head unchanged from yesterday. Will raise drain to 15 H20 and see how she does.    LOS: 4 days    Ocie Cornfield [facility] 01/31/2023, 8:07 AM

## 2023-01-31 NOTE — Progress Notes (Signed)
NAME:  Angela [name], MRN:  [id number], DOB:  03-Feb-1986, LOS: 3 ADMISSION DATE:  01/27/2023, CONSULTATION DATE:  2/9 REFERRING MD:  Dr. Rory Percy, CHIEF COMPLAINT:  Altered Mental Status   History of Present Illness:  37 y/o F who presented to [facility] on 2/9 with reports of altered mental status.    The patient was reported at dialysis and after an hour of treatment she had a syncopal episode.  Her eyes rolled back into her head but there was no seizure activity. Staff at HD center were about to start CPR but they had a pulse.  She was hypertensive in the center and was given 0.2 mg clonidine prior to HD.   In ER, patient blood pressure was 207/113, she was started on Cleviprex infusion, after given 30 mg of IV labetalol.  CT head was done which showed left basal ganglia intraparenchymal hemorrhage with extension to IVH, CTA head and neck was negative for aneurysm.  As patient was found obtunded, she was intubated.  PCCM was consulted to help medical management  Pertinent  Medical History  DM I with DM retinopathy , ESRD on HD, Depression, HTN, HLD, Hypothyroidism, Anemia  Significant Hospital Events: Including procedures, antibiotic start and stop dates in addition to other pertinent events   2/09 Admit with ICH, neurosurgery consulted and EVD placed 2/10 hypoglycemic episodes 2/11 off 3% saline 2/12 dialysis today, ?possible extubation after  Extubated 2/12  Interim History / Subjective:   Extubated 2/12, 3 L nasal cannula I/os -1.1 L total Na 133, SCr 4.8 Platelets 128  Objective   Blood pressure (!) 143/83, pulse 76, temperature 98.1 F (36.7 C), resp. rate 18, height 5' 5"$  (1.651 m), weight 68.3 kg, SpO2 100 %.    Vent Mode: PRVC FiO2 (%):  [40 %] 40 % Set Rate:  [18 bmp] 18 bmp Vt Set:  [450 mL] 450 mL PEEP:  [5 cmH20] 5 cmH20 Plateau Pressure:  [16 cmH20-18 cmH20] 18 cmH20   Intake/Output Summary (Last 24 hours) at 01/30/2023 0857 Last data filed at 01/30/2023 0800 Gross per  24 hour  Intake 1158.36 ml  Output 189 ml  Net 969.36 ml   Filed Weights   01/27/23 1754 01/30/23 0600 01/30/23 0749  Weight: 65.9 kg 68.6 kg 68.3 kg    Examination:  General -ill-appearing woman laying in bed in no distress restraints in place HEENT-oropharynx clear, pupils equal, NG tube in place Cardiac -regular, no murmur Chest -decreased to both bases, otherwise clear.  No crackles or wheezes Abdomen -soft, nontender, nondistended, positive bowel sounds Extremities -left upper extremity fistula, no deformities Skin -no rash Neuro -awake, nods to questions, follows commands, globally weak.  EVD D in place  Resolved Hospital Problem list     Assessment & Plan:   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 P: -Intraventricular drain management as per neurosurgery -Continue Keppra for seizure prophylaxis -Neurology recommendations  HTN emergency. P; -Continue blood pressure regimen as ordered  Acute hypoxemic respiratory failure, resolved Compromised airway, improved. 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 Episodes of hypoglycemia noted 2/10 >> resolved 2/11. P: -Sliding scale insulin on tube feeding -Semglee 10 units daily, tolerating  Nausea with vomiting. -Zofran as needed  Hx of hypothyroidism. -Continue levothyroxine  ESRD. -Scheduled dialysis as per nephrology plans, tolerating  Anemia of chronic disease. -Follow CBC intermittently -Hemoglobin goal 7.0  Hx of depression. -Continue BuSpar and  Lexapro  Best Practice (right click and "Reselect all SmartList Selections" daily)   Diet/type: tubefeeds DVT prophylaxis: SCD GI prophylaxis: PPI Lines: Central line Foley:  N/A Code Status:  full code Last date of multidisciplinary goals of care discussion ]  Labs       Latest Ref Rng & Units 01/30/2023    3:57 AM 01/29/2023    3:42 AM 01/28/2023     3:09 PM  CMP  Glucose 70 - 99 mg/dL 158  170  64   BUN 6 - 20 mg/dL 23  23  18   $ Creatinine 0.44 - 1.00 mg/dL 4.62  5.21  4.62   Sodium 135 - 145 mmol/L 137  141  140   Potassium 3.5 - 5.1 mmol/L 4.4  4.4  3.5   Chloride 98 - 111 mmol/L 98  106  105   CO2 22 - 32 mmol/L 26  23  25   $ Calcium 8.9 - 10.3 mg/dL 9.8  9.7  9.4   Total Protein 6.5 - 8.1 g/dL  6.5    Total Bilirubin 0.3 - 1.2 mg/dL  0.5    Alkaline Phos 38 - 126 U/L  174    AST 15 - 41 U/L  115    ALT 0 - 44 U/L  58         Latest Ref Rng & Units 01/30/2023    3:57 AM 01/29/2023    3:42 AM 01/28/2023    3:09 PM  CBC  WBC 4.0 - 10.5 K/uL 14.8  13.0    Hemoglobin 12.0 - 15.0 g/dL 8.1  10.0  8.6   Hematocrit 36.0 - 46.0 % 25.5  30.4  26.7   Platelets 150 - 400 K/uL 103  129      ABG    Component Value Date/Time   PHART 7.443 01/27/2023 1746   PCO2ART 38.3 01/27/2023 1746   PO2ART 551 (H) 01/27/2023 1746   HCO3 26.2 01/27/2023 1746   TCO2 27 01/27/2023 1746   ACIDBASEDEF 6.0 (H) 12/16/2022 1243   O2SAT 100 01/27/2023 1746    CBG (last 3)  Recent Labs    01/29/23 2332 01/30/23 0340 01/30/23 0739  GLUCAP 354* 164* 192*    This patient is critically ill with multiple organ system failure; which, requires frequent high complexity decision making, assessment, support, evaluation, and titration of therapies. This was completed through the application of advanced monitoring technologies and extensive interpretation of multiple databases. During this encounter critical care time was devoted to patient care services described in this note for 31 minutes.   Baltazar Apo, MD, PhD 01/31/2023, 9:06 AM [facility] [phone] or if no answer before 7:00PM call [phone] For any issues after 7:00PM please call eLink [phone]

## 2023-02-01 ENCOUNTER — Inpatient Hospital Stay (HOSPITAL_COMMUNITY): Payer: Medicaid Other

## 2023-02-01 DIAGNOSIS — I619 Nontraumatic intracerebral hemorrhage, unspecified: Secondary | ICD-10-CM | POA: Diagnosis not present

## 2023-02-01 LAB — CBC
HCT: 27.1 % — ABNORMAL LOW (ref 36.0–46.0)
Hemoglobin: 8.6 g/dL — ABNORMAL LOW (ref 12.0–15.0)
MCH: 29.2 pg (ref 26.0–34.0)
MCHC: 31.7 g/dL (ref 30.0–36.0)
MCV: 91.9 fL (ref 80.0–100.0)
Platelets: 167 10*3/uL (ref 150–400)
RBC: 2.95 MIL/uL — ABNORMAL LOW (ref 3.87–5.11)
RDW: 14.1 % (ref 11.5–15.5)
WBC: 11.7 10*3/uL — ABNORMAL HIGH (ref 4.0–10.5)
nRBC: 0 % (ref 0.0–0.2)

## 2023-02-01 LAB — RENAL FUNCTION PANEL
Albumin: 2.9 g/dL — ABNORMAL LOW (ref 3.5–5.0)
Anion gap: 15 (ref 5–15)
BUN: 37 mg/dL — ABNORMAL HIGH (ref 6–20)
CO2: 25 mmol/L (ref 22–32)
Calcium: 9.6 mg/dL (ref 8.9–10.3)
Chloride: 95 mmol/L — ABNORMAL LOW (ref 98–111)
Creatinine, Ser: 6.4 mg/dL — ABNORMAL HIGH (ref 0.44–1.00)
GFR, Estimated: 8 mL/min — ABNORMAL LOW (ref >60)
Glucose, Bld: 173 mg/dL — ABNORMAL HIGH (ref 70–99)
Phosphorus: 7.1 mg/dL — ABNORMAL HIGH (ref 2.5–4.6)
Potassium: 4.9 mmol/L (ref 3.5–5.1)
Sodium: 135 mmol/L (ref 135–145)

## 2023-02-01 LAB — MAGNESIUM: Magnesium: 2.2 mg/dL (ref 1.7–2.4)

## 2023-02-01 LAB — CULTURE, BLOOD (ROUTINE X 2)
Culture: NO GROWTH
Culture: NO GROWTH
Special Requests: ADEQUATE
Special Requests: ADEQUATE

## 2023-02-01 LAB — GLUCOSE, CAPILLARY
Glucose-Capillary: 121 mg/dL — ABNORMAL HIGH (ref 70–99)
Glucose-Capillary: 241 mg/dL — ABNORMAL HIGH (ref 70–99)
Glucose-Capillary: 182 mg/dL — ABNORMAL HIGH (ref 70–99)
Glucose-Capillary: 78 mg/dL (ref 70–99)
Glucose-Capillary: 142 mg/dL — ABNORMAL HIGH (ref 70–99)
Glucose-Capillary: 153 mg/dL — ABNORMAL HIGH (ref 70–99)

## 2023-02-01 LAB — PHOSPHORUS: Phosphorus: 6.4 mg/dL — ABNORMAL HIGH (ref 2.5–4.6)

## 2023-02-01 MED ORDER — LANTHANUM CARBONATE 500 MG PO CHEW
1000.0000 mg | CHEWABLE_TABLET | Freq: Three times a day (TID) | ORAL | Status: DC
  Administered 2023-02-01 – 2023-02-03 (×7): 1000 mg
  Filled 2023-02-01 (×8): qty 2

## 2023-02-01 MED ORDER — GABAPENTIN 250 MG/5ML PO SOLN
100.0000 mg | Freq: Three times a day (TID) | ORAL | Status: DC
  Administered 2023-02-01 – 2023-02-03 (×7): 100 mg
  Filled 2023-02-01 (×8): qty 2

## 2023-02-01 MED ORDER — LEVETIRACETAM IN NACL 500 MG/100ML IV SOLN
500.0000 mg | Freq: Every day | INTRAVENOUS | Status: DC
  Filled 2023-02-01: qty 100

## 2023-02-01 MED ORDER — INSULIN GLARGINE-YFGN 100 UNIT/ML ~~LOC~~ SOLN
10.0000 [IU] | Freq: Two times a day (BID) | SUBCUTANEOUS | Status: DC
  Administered 2023-02-01 – 2023-02-02 (×2): 10 [IU] via SUBCUTANEOUS
  Filled 2023-02-01 (×3): qty 0.1

## 2023-02-01 MED ORDER — GABAPENTIN 100 MG PO CAPS: 100.0000 mg | ORAL_CAPSULE | Freq: Three times a day (TID) | ORAL | Status: DC

## 2023-02-01 MED ORDER — SODIUM CHLORIDE 0.9 % IV SOLN
2.0000 g | INTRAVENOUS | Status: DC
  Administered 2023-02-02: 2 g via INTRAVENOUS
  Filled 2023-02-01: qty 20

## 2023-02-01 MED ORDER — VITAL 1.5 CAL PO LIQD
1000.0000 mL | ORAL | Status: DC
  Administered 2023-02-01 – 2023-02-02 (×2): 1000 mL

## 2023-02-01 MED ORDER — SODIUM CHLORIDE 0.9 % IV SOLN
250.0000 mg | INTRAVENOUS | Status: DC
  Filled 2023-02-01: qty 2.5
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
## 2023-02-01 NOTE — Progress Notes (Signed)
Inpatient Rehabilitation Admissions Coordinator   I will place Rehab consult for full assessment of rehab options. Hopeful to have EVD clamped soon.  Danne Baxter, RN, MSN Rehab Admissions Coordinator [phone] 02/01/2023 5:58 PM

## 2023-02-01 NOTE — Progress Notes (Signed)
Patient ID: Angela [name], female   DOB: 1986/08/04, 37 y.o.   MRN: [id number] [facility] Progress Note   Assessment/ Plan:   1.  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  She is more awake and alert and increased responsiveness noted over the past 48 hours since her extubation.  2. ESRD: She is on hemodialysis that we will continue on a Monday/Wednesday/Friday schedule with next treatment scheduled for today. 3. Anemia: No overt blood loss noted with acceptable hemoglobin and hematocrit, will continue to follow for ESA requirements. 4. CKD-MBD: Elevated phosphorus level noted with marginally elevated corrected calcium.  I will restart phosphorus binders with meals. 5.  Acute hypoxic respiratory failure: Secondary to altered mental status in the setting of intracranial bleed.  Resolved/extubated and remains on room air without supplemental oxygen. 6. Hypertension: Blood pressures under decent control on oral amlodipine with as needed IV hydralazine/labetalol that has been transitioned from intravenous clevidipine.  Subjective:   Without acute events noted overnight.    Objective:   BP (!) 161/81   Pulse 87   Temp 98.6 F (37 C) (Oral)   Resp 10   Ht 5' 5"$  (1.651 m)   Wt 65.6 kg   SpO2 100%   BMI 24.07 kg/m   Physical Exam: Gen: Comfortably resting in bed, more awake/alert compared to yesterday.  Speaking to therapist at bedside CVS: Pulse regular rhythm, normal rate, S1 and S2 normal Resp: Clear to auscultation bilaterally, no rales/rhonchi Abd: Soft, flat, nontender Ext: Left upper arm AV fistula with intact dressings, no lower extremity edema.  trace upper extremity edema noted  Labs: BMET Recent Labs  Lab 01/27/23 1536 01/27/23 1547 01/27/23 1746 01/27/23 2112 01/28/23 0300 01/28/23 0936 01/28/23 1509 01/28/23 1630 01/29/23 0342  01/29/23 1843 01/30/23 0357 01/31/23 0548 02/01/23 0601  NA 134*   < > 132*   < > 138  138 140 140  --  141  --  137 133* 135  K 3.7   < > 3.6  --  2.9*  --  3.5  --  4.4  --  4.4 4.1 4.9  CL 98  --   --   --  101  --  105  --  106  --  98 92* 95*  CO2 24  --   --   --  25  --  25  --  23  --  26 25 25  $ GLUCOSE 361*  --   --   --  61*  --  64*  --  170*  --  158* 363* 173*  BUN 14  --   --   --  18  --  18  --  23*  --  23* 31* 37*  CREATININE 3.07*  --   --   --  3.83*  --  4.62*  --  5.21*  --  4.62* 4.80* 6.40*  CALCIUM 9.2  --   --   --  9.4  --  9.4  --  9.7  --  9.8 9.7 9.6  PHOS  --   --   --   --   --  4.8*  --  4.9* 4.7* 4.6  --   --  7.1*   < > = values in this interval not displayed.   CBC Recent Labs  Lab 01/27/23 1536 01/27/23 1547 01/29/23 0342 01/30/23 0357 01/31/23 0548 02/01/23 0601  WBC 9.1   < >  13.0* 14.8* 14.2* 11.7*  NEUTROABS 6.8  --   --   --   --   --   HGB 7.5*   < > 10.0* 8.1* 8.2* 8.6*  HCT 23.9*   < > 30.4* 25.5* 26.7* 27.1*  MCV 93.7   < > 89.7 91.4 94.0 91.9  PLT 124*   < > 129* 103* 128* 167   < > = values in this interval not displayed.    Medications:     amLODipine  10 mg Per Tube Daily   busPIRone  15 mg Per Tube QHS   Chlorhexidine Gluconate Cloth  6 each Topical Daily   docusate  100 mg Per Tube BID   escitalopram  20 mg Per Tube Daily   feeding supplement (PROSource TF20)  60 mL Per Tube Daily   heparin injection (subcutaneous)  5,000 Units Subcutaneous Q8H   insulin aspart  0-9 Units Subcutaneous Q4H   insulin glargine-yfgn  10 Units Subcutaneous Daily   levETIRAcetam  250 mg Per Tube Q M,W,F-1800   levothyroxine  175 mcg Per Tube q morning   multivitamin  1 tablet Per Tube QHS   pantoprazole (PROTONIX) IV  40 mg Intravenous QHS   polyethylene glycol  17 g Per Tube Daily   senna-docusate  1 tablet Per Tube BID   Elmarie Shiley, MD 02/01/2023, 9:32 AM

## 2023-02-01 NOTE — Progress Notes (Addendum)
STROKE TEAM PROGRESS NOTE   INTERVAL HISTORY Patient is seen in her room with no family at the bedside.  She  is doing well.  She can intermittently follow commands.  Will have swallow evaluation today.  EVD remains in place, draining well at 15 cm of water.BP adequately controlled Possible clamp trial tomorrow.  Blood pressure adequately controlled.  Vital signs stable.  Vitals:   02/01/23 0700 02/01/23 0715 02/01/23 0800 02/01/23 0900  BP: (!) 164/84 139/78 (!) 148/78 (!) 161/81  Pulse: 89 85 87 87  Resp: (!) 9 12 13 10  $ Temp:   98.6 F (37 C)   TempSrc:   Oral   SpO2: 98% 99% 99% 100%  Weight:      Height:       CBC:  Recent Labs  Lab 01/27/23 1536 01/27/23 1547 01/31/23 0548 02/01/23 0601  WBC 9.1   < > 14.2* 11.7*  NEUTROABS 6.8  --   --   --   HGB 7.5*   < > 8.2* 8.6*  HCT 23.9*   < > 26.7* 27.1*  MCV 93.7   < > 94.0 91.9  PLT 124*   < > 128* 167   < > = values in this interval not displayed.    Basic Metabolic Panel:  Recent Labs  Lab 01/29/23 1843 01/30/23 0357 01/31/23 0548 02/01/23 0601  NA  --  137 133* 135  K  --  4.4 4.1 4.9  CL  --  98 92* 95*  CO2  --  26 25 25  $ GLUCOSE  --  158* 363* 173*  BUN  --  23* 31* 37*  CREATININE  --  4.62* 4.80* 6.40*  CALCIUM  --  9.8 9.7 9.6  MG 1.8 1.8  --   --   PHOS 4.6  --   --  7.1*    Lipid Panel:  Recent Labs  Lab 01/28/23 0300 01/30/23 0357 01/31/23 0548  CHOL 247*  --   --   TRIG 65  64   < > 88  HDL 119  --   --   CHOLHDL 2.1  --   --   VLDL 13  --   --   LDLCALC 115*  --   --    < > = values in this interval not displayed.    HgbA1c:  Recent Labs  Lab 01/28/23 0300  HGBA1C 12.0*    Urine Drug Screen:  Recent Labs  Lab 01/28/23 0715  LABOPIA NONE DETECTED  COCAINSCRNUR NONE DETECTED  LABBENZ NONE DETECTED  AMPHETMU NONE DETECTED  THCU POSITIVE*  LABBARB NONE DETECTED     Alcohol Level  Recent Labs  Lab 01/27/23 1833  ETH <10     IMAGING past 24 hours No results  found.  PHYSICAL EXAM  Physical Exam  Constitutional: Appears well-developed and well-nourished middle-age African-American lady with ventriculostomy.   Cardiovascular: Normal rate and regular rhythm.  Respiratory: Regular, unlabored respirations  Neuro: Mental Status: Patient is drowsy but arouses easily on stimulation, states name and words. Hesitancy of speech. Follows simple commands. Impulsive, poor safety awareness Cranial Nerves: Pupils equal round reactive to light, extraocular movements intact, face appears symmetrical, tongue protrusion not cooperative Motor/sensory Patient moves bilateral upper extremities spontaneously with good antigravity strength and withdraws bilateral lower extremities to noxious stimuli  ASSESSMENT/PLAN Angela [name] is a 37 y.o. female with history of ESRD on HD, DM, HTN, seizures, HLD, GERD, hypothyroidism, anxiety and depression who presents from  dialysis center for evaluation of AMS and syncopal episode.  EVD was placed for hydrocephalus.  She was extubated on 2/12.  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 CTA head & neck unremarkable 2/10 - Repeat Head CT- s/p EVD. Overall ventricular size is similar with persistent mild hydrocephalus. Stable hematoma 2/12- CT head- 1. Stable right frontal approach EVD with ventricular decompression since 01/27/2023. Left caudate intra-axial hemorrhage suspected with secondary IVH. Estimated intra-axial blood volume of 5-6 mL, stable to mildly decreased since 01/27/2023. IVH not significantly changed. No new intracranial abnormality. 2D Echo 60-65%, left atrium is moderately dilated LDL 115 HgbA1c 12.0 UDS positive for THC VTE prophylaxis - heparin subq No antithrombotic prior to  admission, now on No antithrombotic.  Therapy recommendations: Pending Disposition: Pending  Acute Hypoxic Respiratory Failure Intubated 2/9 CCM management for ventilator  On prop and fentanyl gtts Goal SpO2 greater than 92%  Hydrocephalus  HTS at 40 -> switch to D10NS -> off  Na 133->138->140->141->133 EVD placed 2/9 by NSGY 10cmH2O->17cmH2O-> 15 cmH2O  Seizure disorder 09/2021 seizure triggered by hypoglycemia, on Keppra Home meds Keppra 500 mg BID (changed from home dose of daily) daily with 258m additional on dialysis days Not sure compliance Reported frequent seizure at home and with HD EEG 2/10:  Continuous slow, generalized, no seizures   Diabetes type I Uncontrolled Hypoglycemia  D10 d/c'd  SSI TF's restarted after having vomiting overnight  Home meds: Insulin HgbA1c 12.0, goal < 7.0 CBGs  Hypertensive emergency Home meds: Norvasc, Cardizem, furosemide, hydralazine, losartan Cleviprex turned off this am Resume home norvasc 10 Stable now BP goal < 160  Hyperlipidemia Home meds pravastatin 20 LDL 115, goal < 70 AST/ALT 63/45 -> 115/58 Hold off statin due to acute ICH and transaminitis Consider resume statin at discharge  Other Stroke Risk Factors Obesity, Body mass index is 24.07 kg/m., BMI >/= 30 associated with increased stroke risk, recommend weight loss, diet and exercise as appropriate  THC abuse, cessation education will be provided Migraine [facility] Other Active Problems Hypokalemia, resolved  Replete and repeat BMP, check mag K 4.4 Severe anemia  Hgb 7.5-8.5-6.4 -> 1uPRBC 2/10, ->10.0 -> 8.6 Repeat H/H ESRD with HD MWF HD done 2/9 only received 1 hour her 4 hour session Per her mother she had 5 dialysis sessions last week Received HD yesterday 2/10 Nephrology consulted by CCM  Hypothyroidism TSH 201.471 - continue Synthroid Free T4 - 0.77 Anxiety, depression BuSpar, Lexapro Leukocytosis WBC 14.8 -> 11.7 CXR  - left lower lobe atelectasis  Afebrile  Hospital day # 5   Patient seen and examined by NP/APP with MD. MD to update note as needed.   DJanine Ores DNP, FNP-BC [facility] Pager: [phone] I have personally obtained history,examined this patient, reviewed notes, independently viewed imaging studies, participated in medical decision making and plan of care.ROS completed by me personally and pertinent positives fully documented  I have made any additions or clarifications directly to the above note. Agree with note above.  Continue strict blood pressure control with systolic goal below 10000000  Ventriculostomy pop of the baseplate to 15 cm and she seems to be tolerating well hopefully it can come out in the next few days.  Mobilize out  of bed.  Therapy consults.  Speech therapy for swallow eval.  No family at the bedside.  Discussed with Dr. Valeta Harms critical care MD.  Dialysis as per nephrology.This patient is critically ill and at significant risk of neurological worsening, death and care requires constant monitoring of vital signs, hemodynamics,respiratory and cardiac monitoring, extensive review of multiple databases, frequent neurological assessment, discussion with family, other specialists and medical decision making of high complexity.I have made any additions or clarifications directly to the above note.This critical care time does not reflect procedure time, or teaching time or supervisory time of PA/NP/Med Resident etc but could involve care discussion time.  I spent 30 minutes of neurocritical care time  in the care of  this patient.      Antony Contras, MD Medical Director [facility] Pager: [phone] 02/01/2023 2:22 PM

## 2023-02-01 NOTE — Progress Notes (Signed)
PT Cancellation Note  Patient Details Name: Angela [name] MRN: [id number] DOB: 09/30/1986   Cancelled Treatment:    Reason Eval/Treat Not Completed: Patient at procedure or test/unavailable; was on HD.  Will attempt another day.   Reginia Naas 02/01/2023, 2:47 PM Magda Kiel, PT Acute Rehabilitation Services Office:[phone] 02/01/2023

## 2023-02-01 NOTE — Progress Notes (Signed)
Physical Therapy Treatment Patient Details Name: Angela [name] MRN: [id number] DOB: 05-07-1986 Today's Date: 02/01/2023   History of Present Illness Angela [name] is a 37 y.o. female with history of ESRD on HD, DM, HTN, seizures, HLD, GERD, hypothyroidism, anxiety and depression who presents from dialysis center for evaluation of AMS and syncopal episode.  Left BG ICH with IVH s/p EVD, etiology:  uncontrolled hypertension.    PT Comments    Patient restless today per RN and noted improved mobility though impulsive and high safety risk with EVD and multiple lines.  Needs hands on support in sitting only to prevent moving too fast and removing lines.  She reports she was at dialysis and looked incredulous to learn in the hospital.  She seems to have some R side weakness with stepping up to [facility] today.  PT will continue to follow.  Anticipate continued progress, but for now still feel she will need AIR prior to d/c home.    Recommendations for follow up therapy are one component of a multi-disciplinary discharge planning process, led by the attending physician.  Recommendations may be updated based on patient status, additional functional criteria and insurance authorization.  Follow Up Recommendations  Acute inpatient rehab (3hours/day)     Assistance Recommended at Discharge Frequent or constant Supervision/Assistance  Patient can return home with the following Two people to help with walking and/or transfers;Assistance with cooking/housework;Assist for transportation;Direct supervision/assist for medications management;Help with stairs or ramp for entrance;Two people to help with bathing/dressing/bathroom   Equipment Recommendations  Other (comment) (TBA)    Recommendations for Other Services       Precautions / Restrictions Precautions Precaution Comments: EVD drain, mitts and wrist restraints     Mobility  Bed Mobility   Bed Mobility: Supine to Sit Rolling:  Supervision Sidelying to sit: Min assist, Supervision Supine to sit: Supervision     General bed mobility comments: up and down couple of times as pt restless, needing assist for lines and safety and once to lift up    Transfers Overall transfer level: Needs assistance Equipment used: 1 person hand held assist Transfers: Sit to/from Stand, Bed to chair/wheelchair/BSC Sit to Stand: Mod assist, +2 safety/equipment Stand pivot transfers: Mod assist, +2 safety/equipment         General transfer comment: +2 for lines with EVD, coretrack, IV and telemetry; up once to step up toward [facility] then up to [facility] as pt feels she may have BM    Ambulation/Gait Ambulation/Gait assistance: Mod assist, +2 safety/equipment Gait Distance (Feet): 3 Feet Assistive device: 1 person hand held assist Gait Pattern/deviations: Step-to pattern, Decreased stride length, Knees buckling       General Gait Details: mild R side weakness noted with side steps to [facility] then up again to [facility] taking steps again up to [facility] after attempt at toileting   Stairs             Wheelchair Mobility    Modified Rankin (Stroke Patients Only) Modified Rankin (Stroke Patients Only) Pre-Morbid Rankin Score: No symptoms (assumed) Modified Rankin: Severe disability     Balance Overall balance assessment: Needs assistance Sitting-balance support: Feet supported Sitting balance-Leahy Scale: Fair Sitting balance - Comments: can sit unsupported, but due to impulsive and restless kept a hand on at all times   Standing balance support: Single extremity supported Standing balance-Leahy Scale: Poor Standing balance comment: imbalance in standing min to mod A for safety  Cognition Arousal/Alertness: Awake/alert Behavior During Therapy: Restless, Impulsive Overall Cognitive Status: Impaired/Different from baseline Area of Impairment: Orientation, Attention, Memory, Safety/judgement,  Problem solving                 Orientation Level: Disoriented to, Place, Time, Situation Current Attention Level: Focused Memory: Decreased short-term memory, Decreased recall of precautions Following Commands: Follows one step commands consistently, Follows one step commands with increased time Safety/Judgement: Decreased awareness of safety, Decreased awareness of deficits   Problem Solving: Slow processing General Comments: sits up as soon as restraints off, needs redirection until rail put down and needs hands on assist at EOB till EVD and other lines positioned safely        Exercises      General Comments General comments (skin integrity, edema, etc.): VSS      Pertinent Vitals/Pain Pain Assessment Pain Assessment: Faces Faces Pain Scale: Hurts little more Pain Location: LEs Pain Descriptors / Indicators: Discomfort Pain Intervention(s): Monitored during session, Repositioned    Home Living                          Prior Function            PT Goals (current goals can now be found in the care plan section) Progress towards PT goals: Progressing toward goals    Frequency    Min 4X/week      PT Plan Current plan remains appropriate    Co-evaluation              AM-PAC PT "6 Clicks" Mobility   Outcome Measure  Help needed turning from your back to your side while in a flat bed without using bedrails?: A Little Help needed moving from lying on your back to sitting on the side of a flat bed without using bedrails?: A Little Help needed moving to and from a bed to a chair (including a wheelchair)?: A Lot Help needed standing up from a chair using your arms (e.g., wheelchair or bedside chair)?: A Lot Help needed to walk in hospital room?: Total Help needed climbing 3-5 steps with a railing? : Total 6 Click Score: 12    End of Session   Activity Tolerance: Patient tolerated treatment well Patient left: in bed;with bed alarm  set;with call bell/phone within reach Nurse Communication: Mobility status PT Visit Diagnosis: Other abnormalities of gait and mobility (R26.89);Muscle weakness (generalized) (M62.81);Other symptoms and signs involving the nervous system (R29.898)     Time: [id number] PT Time Calculation (min) (ACUTE ONLY): 22 min  Charges:  $Therapeutic Activity: 8-22 mins                     Magda Kiel, PT Acute Rehabilitation Services Office:[phone] 02/01/2023    Reginia Naas 02/01/2023, 5:54 PM

## 2023-02-01 NOTE — Procedures (Signed)
Cortrak  Tube Type:  Cortrak - 43 inches Tube Location:  Left nare Secured by: Bridle Technique Used to Measure Tube Placement:  Marking at nare/corner of mouth Cortrak Secured At:  61 cm   Cortrak Tube Team Note:  Consult received to place a Cortrak feeding tube.   X-ray is required, abdominal x-ray has been ordered by the Cortrak team. Please confirm tube placement before using the Cortrak tube.   If the tube becomes dislodged please keep the tube and contact the Cortrak team at [email or web address] for replacement.  If after hours and replacement cannot be delayed, place a NG tube and confirm placement with an abdominal x-ray.    Koleen Distance MS, RD, LDN Please refer to [facility] for RD and/or RD on-call/weekend/after hours pager

## 2023-02-01 NOTE — Progress Notes (Signed)
Subjective: Patient doing well, no acute events overnight.  Objective: Vital signs in last 24 hours: Temp:  [97.7 F (36.5 C)-98.9 F (37.2 C)] 98.6 F (37 C) (02/14 0800) Pulse Rate:  [82-95] 89 (02/14 1200) Resp:  [9-22] 14 (02/14 1200) BP: (138-178)/(61-97) 178/92 (02/14 1200) SpO2:  [93 %-100 %] 93 % (02/14 1200)  Intake/Output from previous day: 02/13 0701 - 02/14 0700 In: 449.9 [IV Piggyback:449.9] Out: 253 [Drains:253] Intake/Output this shift: Total I/O In: 199.9 [IV Piggyback:199.9] Out: 41 [Drains:41]  Neuro: neuro status improving, she is able to say her name now and FC, MAE well  Lab Results: Lab Results  Component Value Date   WBC 11.7 (H) 02/01/2023   HGB 8.6 (L) 02/01/2023   HCT 27.1 (L) 02/01/2023   MCV 91.9 02/01/2023   PLT 167 02/01/2023   Lab Results  Component Value Date   INR 0.96 09/21/2017   BMET Lab Results  Component Value Date   NA 135 02/01/2023   K 4.9 02/01/2023   CL 95 (L) 02/01/2023   CO2 25 02/01/2023   GLUCOSE 173 (H) 02/01/2023   BUN 37 (H) 02/01/2023   CREATININE 6.40 (H) 02/01/2023   CALCIUM 9.6 02/01/2023    Studies/Results: DG Abd Portable 1V  Result Date: 02/01/2023 CLINICAL DATA:  Feeding tube placement. EXAM: PORTABLE ABDOMEN - 1 VIEW COMPARISON:  January 27, 2023. FINDINGS: Distal tip of feeding tube is seen in the proximal stomach. IMPRESSION: Distal tip of feeding tube is seen in the proximal stomach. Electronically Signed   By: Marijo Conception M.D.   On: 02/01/2023 12:08   CT HEAD WO CONTRAST (5MM)  Result Date: 01/31/2023 CLINICAL DATA:  Stroke, follow-up EXAM: CT HEAD WITHOUT CONTRAST TECHNIQUE: Contiguous axial images were obtained from the base of the skull through the vertex without intravenous contrast. RADIATION DOSE REDUCTION: This exam was performed according to the departmental dose-optimization program which includes automated exposure control, adjustment of the mA and/or kV according to patient size  and/or use of iterative reconstruction technique. COMPARISON:  01/30/2023 FINDINGS: Brain: Right frontal approach ventriculostomy catheter terminates in the right frontal horn, unchanged. Further slight interval decrease in the size of the ventricles, which is most notable when comparing the size of left temporal horn to the prior exam. The volume of intraventricular hemorrhage appears unchanged. Redemonstrated parenchymal hemorrhage left caudate, which measures approximately 2.9 x 2.0 x 2.5 cm (AP x TR x CC) (series 3, image 15 and series 5, image 27), previously 3.0 x 2.0 x 2.4 cm, overall unchanged. Unchanged mass effect on the lateral ventricles, with 6 mm of left-to-right midline shift at the level the septum pellucidum, unchanged. No acute infarct.  Gray-white differentiation is preserved. Vascular: No hyperdense vessel. Atherosclerotic calcifications in the intracranial carotid and vertebral arteries. Skull: Right frontal burr hole.  No acute fracture or focal lesion. Sinuses/Orbits: Near complete opacification of the right maxillary sinus. Small air-fluid level in the left maxillary sinus. Additional air-fluid levels in the bilateral ethmoid air cells, bilateral sphenoid sinuses, and right frontal sinus. Other: The mastoids are well aerated. IMPRESSION: 1. Further slight interval decrease in the size of the ventricles, which is most notable when comparing the size of the left temporal horn to the prior exam. The volume of intraventricular hemorrhage appears unchanged. 2. Unchanged parenchymal hemorrhage centered in the left caudate, with 6 mm of left-to-right midline shift. Electronically Signed   By: Merilyn Baba M.D.   On: 01/31/2023 03:08    Assessment/Plan: S/p  EVD placement. She is doing well with EVD at 15H20. Continue drain for today, maybe clamp tomorrow.   LOS: 5 days    Ocie Cornfield [facility] 02/01/2023, 12:58 PM
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
## 2023-02-01 NOTE — Inpatient Diabetes Management (Signed)
Inpatient Diabetes Program Recommendations  AACE/ADA: New Consensus Statement on Inpatient Glycemic Control   Target Ranges:  Prepandial:   less than 140 mg/dL      Peak postprandial:   less than 180 mg/dL (1-2 hours)      Critically ill patients:  140 - 180 mg/dL    Latest Reference Range & Units 02/01/23 03:30 02/01/23 07:58 02/01/23 9:45 02/01/23 11:31  Glucose-Capillary 70 - 99 mg/dL 121 (H)  Novolog 1 unit 241 (H)  Novolog 3 units     Semglee 10 units 182 (H)  Novolog 2 units    Latest Reference Range & Units 01/31/23 07:47 01/31/23 11:53 01/31/23 16:07 01/31/23 16:15 01/31/23 16:38 01/31/23 19:32 01/31/23 23:24  Glucose-Capillary 70 - 99 mg/dL 350 (H)  Novolog 15 units @8$ :15 148 (H)  Novolog 3 units @11$ :57  Semglee 10 units 33 (LL) 28 (LL) 109 (H) 85 88   Review of Glycemic Control  Diabetes history: Type 1 DM Outpatient Diabetes medications: Novolog 2-8 units tid with meals,Tresiba 12 units daily Current orders for Inpatient glycemic control: Novolog 0-9 units q 4 hours, Semglee 10 units BID; Vital @ 50 ml/hr  Inpatient Diabetes Program Recommendations:    Insulin: Hypoglycemia on 01/31/23 likely from too much Novolog. Patient received Semglee 10 units once on 01/31/23 and noted Semglee increased to 10 units BID today.  May want to consider decreasing Semglee to 10 units daily and if CBGs are consistently over 180 mg/dl consider adding Novolog 2 units Q4H for tube feeding coverage  Thanks, Barnie Alderman, RN, MSN, [place] Diabetes Coordinator Inpatient Diabetes Program [phone] (Team Pager from 8am to [place])

## 2023-02-01 NOTE — Progress Notes (Signed)
Nutrition Quick Note:   MD consult for EN management. Pt had Cortrak placed today. Pt was seen and assessed by RD on 2/13. RD placed TF recommendations in her note. RD will place TF order to begin today.   - Initiate Vital 1.5 at 20 mL/hour and advance by 10 mL/hour every 8 hours to goal rate of 50 mL/hour (1200 mL goal daily volume) -Provide PROSource TF20 60 mL daily per tube -Goal regimen provides: 1880 kcal, 101 grams of protein, 912 mL H2O daily   As tube feeds are advanced, monitor magnesium, phosphorus, and potassium BID for 4 occurrences, MD to replete as needed, as pt is at risk for refeeding syndrome.   - Continue Rena-vite QHS per tube.  Thalia Bloodgood, RD, LDN, CNSC.

## 2023-02-01 NOTE — Progress Notes (Signed)
PCCM:  PCCM will sign off. Doing well post extubation  Case discussed with Dr. Leonie Man.  Please call us if we can help in any way  Thanks  [facility] 02/01/2023 8:22 AM

## 2023-02-02 ENCOUNTER — Inpatient Hospital Stay (HOSPITAL_COMMUNITY): Payer: Medicaid Other

## 2023-02-02 DIAGNOSIS — I619 Nontraumatic intracerebral hemorrhage, unspecified: Secondary | ICD-10-CM | POA: Diagnosis not present

## 2023-02-02 LAB — RENAL FUNCTION PANEL
Albumin: 2.9 g/dL — ABNORMAL LOW (ref 3.5–5.0)
Anion gap: 16 — ABNORMAL HIGH (ref 5–15)
BUN: 32 mg/dL — ABNORMAL HIGH (ref 6–20)
CO2: 23 mmol/L (ref 22–32)
Calcium: 9.5 mg/dL (ref 8.9–10.3)
Chloride: 96 mmol/L — ABNORMAL LOW (ref 98–111)
Creatinine, Ser: 5.95 mg/dL — ABNORMAL HIGH (ref 0.44–1.00)
GFR, Estimated: 9 mL/min — ABNORMAL LOW (ref >60)
Glucose, Bld: 86 mg/dL (ref 70–99)
Phosphorus: 5.5 mg/dL — ABNORMAL HIGH (ref 2.5–4.6)
Potassium: 4 mmol/L (ref 3.5–5.1)
Sodium: 135 mmol/L (ref 135–145)

## 2023-02-02 LAB — GLUCOSE, CAPILLARY
Glucose-Capillary: 57 mg/dL — ABNORMAL LOW (ref 70–99)
Glucose-Capillary: 73 mg/dL (ref 70–99)
Glucose-Capillary: 137 mg/dL — ABNORMAL HIGH (ref 70–99)
Glucose-Capillary: 252 mg/dL — ABNORMAL HIGH (ref 70–99)
Glucose-Capillary: 133 mg/dL — ABNORMAL HIGH (ref 70–99)
Glucose-Capillary: 172 mg/dL — ABNORMAL HIGH (ref 70–99)
Glucose-Capillary: 234 mg/dL — ABNORMAL HIGH (ref 70–99)

## 2023-02-02 LAB — PHOSPHORUS
Phosphorus: 5.5 mg/dL — ABNORMAL HIGH (ref 2.5–4.6)
Phosphorus: 6.3 mg/dL — ABNORMAL HIGH (ref 2.5–4.6)

## 2023-02-02 LAB — MAGNESIUM
Magnesium: 2.2 mg/dL (ref 1.7–2.4)
Magnesium: 2.1 mg/dL (ref 1.7–2.4)

## 2023-02-02 MED ORDER — INSULIN GLARGINE-YFGN 100 UNIT/ML ~~LOC~~ SOLN
10.0000 [IU] | Freq: Every day | SUBCUTANEOUS | Status: DC
  Administered 2023-02-03: 10 [IU] via SUBCUTANEOUS
  Filled 2023-02-02: qty 0.1

## 2023-02-02 MED ORDER — LEVETIRACETAM IN NACL 500 MG/100ML IV SOLN
500.0000 mg | Freq: Two times a day (BID) | INTRAVENOUS | Status: DC
  Administered 2023-02-02 (×2): 500 mg via INTRAVENOUS
  Filled 2023-02-02: qty 100

## 2023-02-02 MED ORDER — EPOETIN ALFA-EPBX 3000 UNIT/ML IJ SOLN
3000.0000 [IU] | INTRAMUSCULAR | Status: DC
  Filled 2023-02-02: qty 1

## 2023-02-02 MED ORDER — DILTIAZEM HCL 60 MG PO TABS
60.0000 mg | ORAL_TABLET | Freq: Four times a day (QID) | ORAL | Status: DC
  Administered 2023-02-02 – 2023-02-03 (×5): 60 mg
  Filled 2023-02-02 (×5): qty 1

## 2023-02-02 MED ORDER — HYDRALAZINE HCL 50 MG PO TABS
100.0000 mg | ORAL_TABLET | Freq: Three times a day (TID) | ORAL | Status: DC
  Administered 2023-02-02 – 2023-02-03 (×4): 100 mg
  Filled 2023-02-02 (×4): qty 2
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
## 2023-02-02 NOTE — Progress Notes (Signed)
Pt is very uncomfortable and having leg pain, burning, cramping despite fluid bolus and UF goal reduction. Pt requested to end tx. RN/tech at bedside encouraged pt to complete tx, pt stated she could not, she was in too much pain. Blood return initiated and tx discontinued.

## 2023-02-02 NOTE — Progress Notes (Signed)
Patient ID: Alara Buckley, female   DOB: 1986-07-20, 37 y.o.   MRN: [id number] [facility] Progress Note   Assessment/ Plan:   1.  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  Continues to show improving mentation/recovery. 2. ESRD: She is on hemodialysis that we will continue on a Monday/Wednesday/Friday schedule with next treatment ordered for tomorrow with limited ultrafiltration to limit cramping. 3. Anemia: No overt blood loss noted with acceptable hemoglobin and hematocrit, will start her on erythropoietin with dialysis. 4. CKD-MBD: Elevated phosphorus level noted with marginally elevated corrected calcium.  I will restart phosphorus binders with meals. 5.  Acute hypoxic respiratory failure: Secondary to altered mental status in the setting of intracranial bleed.  Resolved/extubated and remains on room air without supplemental oxygen. 6. Hypertension: Blood pressures under decent control on oral amlodipine with as needed IV hydralazine/labetalol that has been transitioned from intravenous clevidipine.  Subjective:   Intolerable lower extremity cramps not improving with UF goal reduction/saline bolus earlier today prompting truncated dialysis treatment.   Objective:   BP (!) 147/91   Pulse 83   Temp 97.7 F (36.5 C) (Oral)   Resp 18   Ht 5' 5"$  (1.651 m)   Wt 62.2 kg   SpO2 92%   BMI 22.82 kg/m   Physical Exam: Gen: Comfortably resting in bed, mother at bedside.  Remains with improvement of responsiveness/conversation. CVS: Pulse regular rhythm, normal rate, S1 and S2 normal Resp: Clear to auscultation bilaterally, no rales/rhonchi Abd: Soft, flat, nontender Ext: Left upper arm AV fistula with intact dressings, no lower extremity edema.  trace upper extremity edema noted  Labs: BMET Recent Labs  Lab 01/28/23 0300 01/28/23 0936 01/28/23 1509  01/28/23 1630 01/29/23 0342 01/29/23 1843 01/30/23 0357 01/31/23 0548 02/01/23 0601 02/01/23 1315 02/02/23 0446  NA 138  138 140 140  --  141  --  137 133* 135  --  135  K 2.9*  --  3.5  --  4.4  --  4.4 4.1 4.9  --  4.0  CL 101  --  105  --  106  --  98 92* 95*  --  96*  CO2 25  --  25  --  23  --  26 25 25  $ --  23  GLUCOSE 61*  --  64*  --  170*  --  158* 363* 173*  --  86  BUN 18  --  18  --  23*  --  23* 31* 37*  --  32*  CREATININE 3.83*  --  4.62*  --  5.21*  --  4.62* 4.80* 6.40*  --  5.95*  CALCIUM 9.4  --  9.4  --  9.7  --  9.8 9.7 9.6  --  9.5  PHOS  --  4.8*  --  4.9* 4.7* 4.6  --   --  7.1* 6.4* 5.5*  5.5*   CBC Recent Labs  Lab 01/27/23 1536 01/27/23 1547 01/29/23 0342 01/30/23 0357 01/31/23 0548 02/01/23 0601  WBC 9.1   < > 13.0* 14.8* 14.2* 11.7*  NEUTROABS 6.8  --   --   --   --   --   HGB 7.5*   < > 10.0* 8.1* 8.2* 8.6*  HCT 23.9*   < > 30.4* 25.5* 26.7* 27.1*  MCV 93.7   < > 89.7 91.4 94.0 91.9  PLT 124*   < > 129* 103* 128* 167   < > =  values in this interval not displayed.    Medications:     amLODipine  10 mg Per Tube Daily   busPIRone  15 mg Per Tube QHS   Chlorhexidine Gluconate Cloth  6 each Topical Daily   escitalopram  20 mg Per Tube Daily   feeding supplement (PROSource TF20)  60 mL Per Tube Daily   gabapentin  100 mg Per Tube Q8H   heparin injection (subcutaneous)  5,000 Units Subcutaneous Q8H   insulin aspart  0-9 Units Subcutaneous Q4H   insulin glargine-yfgn  10 Units Subcutaneous BID   lanthanum  1,000 mg Per Tube TID with meals   levothyroxine  175 mcg Per Tube q morning   multivitamin  1 tablet Per Tube QHS   pantoprazole (PROTONIX) IV  40 mg Intravenous QHS   polyethylene glycol  17 g Per Tube Daily   senna-docusate  1 tablet Per Tube BID   Elmarie Shiley, MD 02/02/2023, 9:56 AM

## 2023-02-02 NOTE — Progress Notes (Signed)
Portal chest xray ordered d/t pt pulling her cortrak when confused and tube needing to be advanced.

## 2023-02-02 NOTE — Progress Notes (Signed)
Pre Hemodialysis   02/02/23 0143  Vitals  Temp 98.4 F (36.9 C)  Pulse Rate 87  Resp 17  BP (!) 157/85  SpO2 96 %  O2 Device Room Air  Oxygen Therapy  Patient Activity (if Appropriate) In bed  Pulse Oximetry Type Continuous  Pre Treatment  Is pt a NEW START this admission?  No  Vascular access used during treatment Fistula  Patient is receiving dialysis in a chair No  Hemodialysis Consent Verified Yes  Hemodialysis Standing Orders Initiated Yes  ECG (Telemetry) Monitor On Yes  Prime Ordered Normal Saline  Length of  DialysisTreatment -hour(s) 3 Hour(s)  Dialysis mode HD  Dialyzer Revaclear 400  Dialysate 3K;2.5 Ca  Dialysate Flow Ordered 300  Blood Flow Rate Ordered 400 mL/min (as tolerated)  Ultrafiltration Goal 2000 Liters  Dialysis Blood Pressure Support Ordered Normal Saline

## 2023-02-02 NOTE — Progress Notes (Signed)
Occupational Therapy Treatment Patient Details Name: Tamira Culhane MRN: [id number] DOB: 12/15/1986 Today's Date: 02/02/2023   History of present illness Priyanshi Mcneece is a 37 y.o. female with history of ESRD on HD, DM, HTN, seizures, HLD, GERD, hypothyroidism, anxiety and depression who presents from dialysis center for evaluation of AMS and syncopal episode.  Left BG ICH with IVH s/p EVD, etiology:  uncontrolled hypertension.   OT comments  Session with focus on functional mobility, ADL retraining and functional transfers. Pt verbalized that she needed to use the [facility]. Impulsive during majority of session although did ask about standing several times before the fact towards end of session. Able to answer questions asked by therapist related to prior level of function with Mom confirming. Reports no visual impairment. Would benefit from LUE strengthening next session and cognitive tasks to work on attention and safety/judgement.   Recommendations for follow up therapy are one component of a multi-disciplinary discharge planning process, led by the attending physician.  Recommendations may be updated based on patient status, additional functional criteria and insurance authorization.    Follow Up Recommendations  Acute inpatient rehab (3hours/day)     Assistance Recommended at Discharge Frequent or constant Supervision/Assistance  Patient can return home with the following  Two people to help with walking and/or transfers;Two people to help with bathing/dressing/bathroom;Assistance with cooking/housework;Assistance with feeding;Help with stairs or ramp for entrance;Assist for transportation;Direct supervision/assist for financial management;Direct supervision/assist for medications management   Equipment Recommendations  Other (comment)       Precautions / Restrictions Precautions Precautions: Fall Precaution Comments: EVD (now clamped), left side weakness Restrictions Weight Bearing  Restrictions: No       Mobility Bed Mobility Overal bed mobility: Needs Assistance Bed Mobility: Supine to Sit, Sidelying to Sit   Sidelying to sit: Min assist, HOB elevated Supine to sit: Min assist     General bed mobility comments: hand hold to sit up and assist for legs/positioning to supine, assist for multiple lines as well Patient Response: Flat affect, Restless  Transfers Overall transfer level: Needs assistance Equipment used: Rolling walker (2 wheels) Transfers: Sit to/from Stand, Bed to chair/wheelchair/BSC Sit to Stand: Mod assist, +2 safety/equipment Stand pivot transfers: Mod assist, +2 safety/equipment         General transfer comment: +2 for lines with EVD, coretrack, IV and telemetry; up to [facility] initially +BM and then stood to RW with assist for hygiene due to lines     Balance Overall balance assessment: Needs assistance Sitting-balance support: Feet supported Sitting balance-Leahy Scale: Fair Sitting balance - Comments: can sit unsupported, but due to impulsive and restless kept a hand on at all times   Standing balance support: Bilateral upper extremity supported Standing balance-Leahy Scale: Poor Standing balance comment: min A with RW for balance         ADL either performed or assessed with clinical judgement   ADL       Toilet Transfer: +2 for safety/equipment;BSC/3in1;Ambulation;Moderate assistance   Toileting- Clothing Manipulation and Hygiene: Total assistance;+2 for physical assistance;Sit to/from stand;Cueing for safety               Vision Baseline Vision/History: 5 Retinopathy Ability to See in Adequate Light: 0 Adequate Patient Visual Report: No change from baseline     Perception Perception Perception: Not tested   Praxis Praxis Praxis: Impaired Praxis Impairment Details: Motor planning    Cognition Arousal/Alertness: Lethargic Behavior During Therapy: Impulsive Overall Cognitive Status: Impaired/Different from  baseline Area of Impairment: Attention,  Following commands, Problem solving, Safety/judgement     Memory: Decreased short-term memory, Decreased recall of precautions Following Commands: Follows one step commands consistently, Follows one step commands with increased time Safety/Judgement: Decreased awareness of safety, Decreased awareness of deficits   Problem Solving: Slow processing General Comments: Able to verbalize that she needed to have a BM during session .              General Comments Mother present and able to give home set up information. Agreeable to CIR prior to home where she can assist some.    Pertinent Vitals/ Pain       Pain Assessment Pain Assessment: Faces Faces Pain Scale: Hurts little more Pain Location: lower extremities and buttocks Pain Descriptors / Indicators: Discomfort Pain Intervention(s): Repositioned, Monitored during session  [place] expects to be discharged to:: Private residence Living Arrangements: Parent Available Help at Discharge: Family;Available 24 hours/day Type of Home: House Home Access: Stairs to enter [employer] of Steps: 10 Entrance Stairs-Rails: Right;Left Home Layout: One level     Bathroom Shower/Tub: Tub/shower unit (low tub)   Bathroom Toilet: Standard     Home Equipment: None      Lives With: Family    Prior Functioning/Environment         Mod I with ADL tasks, driving to dialysis until her seizures got bad.      Frequency  Min 2X/week        Progress Toward Goals  OT Goals(current goals can now be found in the care plan section)  Progress towards OT goals: Progressing toward goals     Plan Discharge plan remains appropriate;Frequency remains appropriate    Co-evaluation    PT/OT/SLP Co-Evaluation/Treatment: Yes Reason for Co-Treatment: To address functional/ADL transfers;For patient/therapist safety;Necessary to address cognition/behavior during functional  activity PT goals addressed during session: Mobility/safety with mobility;Balance;Proper use of DME;Strengthening/ROM OT goals addressed during session: ADL's and self-care;Strengthening/ROM;Proper use of Adaptive equipment and DME      AM-PAC OT "6 Clicks" Daily Activity     Outcome Measure   Help from another person eating meals?: Total Help from another person taking care of personal grooming?: Total Help from another person toileting, which includes using toliet, bedpan, or urinal?: Total Help from another person bathing (including washing, rinsing, drying)?: Total Help from another person to put on and taking off regular upper body clothing?: Total Help from another person to put on and taking off regular lower body clothing?: Total 6 Click Score: 6    End of Session Equipment Utilized During Treatment: Gait belt;Rolling walker (2 wheels)  OT Visit Diagnosis: Unsteadiness on feet (R26.81);Muscle weakness (generalized) (M62.81)   Activity Tolerance Patient limited by fatigue;Patient tolerated treatment well   Patient Left in chair;with call bell/phone within reach;with chair alarm set;with family/visitor present   Nurse Communication Mobility status        Time: [id number] OT Time Calculation (min): 31 min  Charges: OT General Charges $OT Visit: 1 Visit OT Treatments $Self Care/Home Management : 8-22 mins $Therapeutic Activity: 8-22 mins  Ailene Ravel, OTR/L,CBIS  Supplemental OT - [facility] and [facility] Secure Chat Preferred    Kaveon Blatz, Clarene Duke 02/02/2023, 12:57 PM

## 2023-02-02 NOTE — Progress Notes (Signed)
IP rehab admissions - I met with patient, her mother and her aunt at the bedside.  Patient lives with her mom and mom can provide 24/7 care and supervision.  Mom has been assisting at home since patient has been having more frequent seizures.  Mom is not currently working.  Will await medical readiness prior to inpatient rehab admission.  Call me for questions.  [phone]

## 2023-02-02 NOTE — Progress Notes (Signed)
Speech Language Pathology Treatment: Dysphagia  Patient Details Name: Angela [name] MRN: [id number] DOB: 25-Jun-1986 Today's Date: 02/02/2023 Time: [id number] SLP Time Calculation (min) (ACUTE ONLY): 25 min  Assessment / Plan / Recommendation Clinical Impression  Pt was seen for treatment with her mother present. Pt's level of alertness was improved compared to that noted on 2/14. Vocal quality was less hoarse and reportedly at baseline. Pt exhibited improved orientation to time. Prompts were necessary for problem solving related to safety due to pt's reduced intellectual awareness. Mastication was prolonged with regular texture solids and mild oral residue was noted, but pt's mother reported mastication to be at baseline. Pt tolerated ice chips, puree, and regular texture solids without overt s/s of aspiration. However, throat clearing and coughing were inconsistently noted with thin liquids. Pt's NPO status will be maintained and ice chips still allowed after oral care, but prognosis for diet initiation is judged to be very good at this time. SLP will continue to follow pt, but anticipates that a modified barium swallow study will be necessary on subsequent date prior to diet initiation.    HPI HPI: Pt is a 37 y.o. female who presented from dialysis center for evaluation of AMS. Pt was found obtunded, and she was intubated. ETT 2/9-2/12. CT head 2/9: intraparenchymal hematoma centered at the anterior left basal ganglia with intraventricular extension. Pt s/p EVD 2/9. PMH: ESRD on HD, DM, HTN, seizures, HLD, GERD, hypothyroidism, anxiety and depression.      SLP Plan  Continue with current plan of care      Recommendations for follow up therapy are one component of a multi-disciplinary discharge planning process, led by the attending physician.  Recommendations may be updated based on patient status, additional functional criteria and insurance authorization.    Recommendations  Diet  recommendations: NPO (ice chips after oral care) Medication Administration: Via alternative means                Oral Care Recommendations: Oral care BID Follow Up Recommendations: Acute inpatient rehab (3hours/day) Assistance recommended at discharge: Frequent or constant Supervision/Assistance SLP Visit Diagnosis: Dysphagia, unspecified (R13.10) Plan: Continue with current plan of care         Korah Hufstedler I. Hardin Negus, [place], [place] Office number [phone]  Horton Marshall  02/02/2023, 10:44 AM

## 2023-02-02 NOTE — Progress Notes (Addendum)
STROKE TEAM PROGRESS NOTE   INTERVAL HISTORY Patient is seen in her room with no family at the bedside.  She is doing well.  She can intermittently follow commands.  She is complaining of moderate headaches and Tylenol is not helping Drain clamped today and CT scan tomorrow morning.  Cortrak placed and SLP following.  Blood pressure adequately controlled.  Vital signs stable.  Vitals:   02/02/23 0500 02/02/23 0600 02/02/23 0700 02/02/23 0800  BP: (!) 150/88 (!) 171/91 (!) 148/84 (!) 155/91  Pulse: 81 84 76 80  Resp: 14 20 15 13  $ Temp:    97.7 F (36.5 C)  TempSrc:    Oral  SpO2: 95% 96% 95% 97%  Weight:      Height:       CBC:  Recent Labs  Lab 01/27/23 1536 01/27/23 1547 01/31/23 0548 02/01/23 0601  WBC 9.1   < > 14.2* 11.7*  NEUTROABS 6.8  --   --   --   HGB 7.5*   < > 8.2* 8.6*  HCT 23.9*   < > 26.7* 27.1*  MCV 93.7   < > 94.0 91.9  PLT 124*   < > 128* 167   < > = values in this interval not displayed.    Basic Metabolic Panel:  Recent Labs  Lab 02/01/23 0601 02/01/23 1315 02/02/23 0446  NA 135  --  135  K 4.9  --  4.0  CL 95*  --  96*  CO2 25  --  23  GLUCOSE 173*  --  86  BUN 37*  --  32*  CREATININE 6.40*  --  5.95*  CALCIUM 9.6  --  9.5  MG  --  2.2 2.2  PHOS 7.1* 6.4* 5.5*  5.5*    Lipid Panel:  Recent Labs  Lab 01/28/23 0300 01/30/23 0357 01/31/23 0548  CHOL 247*  --   --   TRIG 65  64   < > 88  HDL 119  --   --   CHOLHDL 2.1  --   --   VLDL 13  --   --   LDLCALC 115*  --   --    < > = values in this interval not displayed.    HgbA1c:  Recent Labs  Lab 01/28/23 0300  HGBA1C 12.0*    Urine Drug Screen:  Recent Labs  Lab 01/28/23 0715  LABOPIA NONE DETECTED  COCAINSCRNUR NONE DETECTED  LABBENZ NONE DETECTED  AMPHETMU NONE DETECTED  THCU POSITIVE*  LABBARB NONE DETECTED     Alcohol Level  Recent Labs  Lab 01/27/23 1833  ETH <10     IMAGING past 24 hours DG Abd Portable 1V  Result Date: 02/01/2023 CLINICAL DATA:   Feeding tube placement. EXAM: PORTABLE ABDOMEN - 1 VIEW COMPARISON:  January 27, 2023. FINDINGS: Distal tip of feeding tube is seen in the proximal stomach. IMPRESSION: Distal tip of feeding tube is seen in the proximal stomach. Electronically Signed   By: Marijo Conception M.D.   On: 02/01/2023 12:08    PHYSICAL EXAM  Physical Exam  Constitutional: Appears well-developed and well-nourished middle-age African-American lady with ventriculostomy.   Cardiovascular: Normal rate and regular rhythm.  Respiratory: Regular, unlabored respirations  Neuro: Mental Status: Patient awake and alert, states name, location. Hesitancy of speech. Follows commands. Less restless today Cranial Nerves: Pupils equal round reactive to light, extraocular movements intact, face appears symmetrical, shoulder shrug equal, tongue protrusion midline Motor/sensory Patient moves bilateral  upper extremities and lower extremities spontaneously with good antigravity strength  Sensation equal bilaterally. Gait: assisted with walker, slow. unsteady  ASSESSMENT/PLAN [name] is a 37 y.o. female with history of ESRD on HD, DM, HTN, seizures, HLD, GERD, hypothyroidism, anxiety and depression who presents from dialysis center for evaluation of AMS and syncopal episode.  EVD was placed for hydrocephalus.  She was extubated on 2/12.  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 CTA head & neck unremarkable 2/10 - Repeat Head CT- s/p EVD. Overall ventricular size is similar with persistent mild hydrocephalus. Stable hematoma 2/12- CT head- 1. Stable right frontal approach EVD with ventricular decompression since 01/27/2023. Left caudate intra-axial hemorrhage suspected with secondary IVH. Estimated  intra-axial blood volume of 5-6 mL, stable to mildly decreased since 01/27/2023. IVH not significantly changed. No new intracranial abnormality. 2D Echo 60-65%, left atrium is moderately dilated LDL 115 HgbA1c 12.0 UDS positive for THC VTE prophylaxis - heparin subq No antithrombotic prior to admission, now on No antithrombotic.  Therapy recommendations: Pending Disposition: Pending  Acute Hypoxic Respiratory Failure Intubated 2/9 -> 2/12 Goal SpO2 greater than 92%  Hydrocephalus  HTS at 40 -> switch to D10NS -> off  Na 133->138->140->141->133 EVD placed 2/9 by NSGY 10cmH2O->17cmH2O-> 15 cmH2O Clamped 2/15 Repeat CT Head 2/16  Seizure disorder 09/2021 seizure triggered by hypoglycemia, on Keppra Home meds Keppra 500 mg BID (changed from home dose of daily) daily with 233m additional on dialysis days Not sure compliance Reported frequent seizure at home and with HD EEG 2/10:  Continuous slow, generalized, no seizures   Diabetes type I Uncontrolled Hypoglycemia  D10 d/c'd  SSI TF's restarted after having vomiting overnight  Home meds: Insulin HgbA1c 12.0, goal < 7.0 CBGs  Hypertensive emergency Home meds: Norvasc, Cardizem, furosemide, hydralazine, losartan Cleviprex turned off this am Resume home norvasc 10 Stable now BP goal < 160  Hyperlipidemia Home meds pravastatin 20 LDL 115, goal < 70 AST/ALT 63/45 -> 115/58 Hold off statin due to acute ICH and transaminitis Consider resume statin at discharge  Other Stroke Risk Factors Obesity, Body mass index is 22.82 kg/m., BMI >/= 30 associated with increased stroke risk, recommend weight loss, diet and exercise as appropriate  THC abuse, cessation education will be provided Mig[facility] Other Active Problems Hypokalemia, resolved  Replete and repeat BMP, check mag Severe anemia  Hgb 7.5-8.5-6.4 -> 1uPRBC 2/10, ->10.0 -> 8.6 Repeat H/H ESRD with HD MWF HD done 2/9 only received 1  hour her 4 hour session Per her mother she had 5 dialysis sessions last week Nephrology consulted by CCM  Hypothyroidism TSH 201.471 - continue Synthroid Free T4 - 0.77 Anxiety, depression BuSpar, Lexapro Leukocytosis WBC 14.8 -> 11.7 CXR - left lower lobe atelectasis  Afebrile Restlessness, leg pain  Gabapentin [id number] Improved  Hospital day # 6   Patient seen and examined by NP/APP with MD. MD to update note as needed.   DeJanine OresDNP, FNP-BC [facility] Pager: [phone] have personally obtained history,examined this patient, reviewed notes, independently viewed imaging studies, participated in medical decision making and plan of care.ROS completed by me personally and pertinent positives fully documented  I have made any additions or clarifications directly  to the above note. Agree with note above.  Patient remains neurologically stable but is still complaining of headache.  Plan to change Tylenol to Tylenol No. 3.  Ventriculostomy catheter is clamped today and plan to check CT head tomorrow and if stable may discontinue ventricle.  Mobilize out of bed with therapy.  Long discussion with patient and mother at the bedside and answered questions.This patient is critically ill and at significant risk of neurological worsening, death and care requires constant monitoring of vital signs, hemodynamics,respiratory and cardiac monitoring, extensive review of multiple databases, frequent neurological assessment, discussion with family, other specialists and medical decision making of high complexity.I have made any additions or clarifications directly to the above note.This critical care time does not reflect procedure time, or teaching time or supervisory time of PA/NP/Med Resident etc but could involve care discussion time.  I spent 30 minutes of neurocritical care time  in the care of  this patient.      Antony Contras, MD Medical Director [facility] Pager: [phone] 02/02/2023 1:35 PM

## 2023-02-02 NOTE — Progress Notes (Signed)
Subjective: Patient reports Doing well, no acute events overnight  Objective: Vital signs in last 24 hours: Temp:  [97.6 F (36.4 C)-98.6 F (37 C)] 97.6 F (36.4 C) (02/15 0400) Pulse Rate:  [74-91] 84 (02/15 0600) Resp:  [7-21] 20 (02/15 0600) BP: (141-178)/(77-107) 171/91 (02/15 0600) SpO2:  [92 %-100 %] 96 % (02/15 0600) Weight:  [62.2 kg] 62.2 kg (02/14 1400)  Intake/Output from previous day: 02/14 0701 - 02/15 0700 In: 659.9 [NG/GT:460; IV Piggyback:199.9] Out: 527 [Urine:250; Drains:177] Intake/Output this shift: No intake/output data recorded.  Neurologic: Grossly normal  Lab Results: Lab Results  Component Value Date   WBC 11.7 (H) 02/01/2023   HGB 8.6 (L) 02/01/2023   HCT 27.1 (L) 02/01/2023   MCV 91.9 02/01/2023   PLT 167 02/01/2023   Lab Results  Component Value Date   INR 0.96 09/21/2017   BMET Lab Results  Component Value Date   NA 135 02/02/2023   K 4.0 02/02/2023   CL 96 (L) 02/02/2023   CO2 23 02/02/2023   GLUCOSE 86 02/02/2023   BUN 32 (H) 02/02/2023   CREATININE 5.95 (H) 02/02/2023   CALCIUM 9.5 02/02/2023    Studies/Results: DG Abd Portable 1V  Result Date: 02/01/2023 CLINICAL DATA:  Feeding tube placement. EXAM: PORTABLE ABDOMEN - 1 VIEW COMPARISON:  January 27, 2023. FINDINGS: Distal tip of feeding tube is seen in the proximal stomach. IMPRESSION: Distal tip of feeding tube is seen in the proximal stomach. Electronically Signed   By: Marijo Conception M.D.   On: 02/01/2023 12:08    Assessment/Plan: S/p EVD placement for hydrocephalus. She's doing really well and making great improvements. She is conversing more. We will clamp her drain today and see how she tolerates this with a follow up head CT tomorrow.    LOS: 6 days    Ocie Cornfield [facility] 02/02/2023, 7:41 AM

## 2023-02-02 NOTE — Progress Notes (Signed)
Once blood was returned RN/Tech toileted pt and admin medications for pain; BS result was low and RN intervened. Once pt was stable and before removing needles, I encouraged pt to resume tx Pt refused. I then proceeded to deaccess needles.   02/02/23 0330  Vitals  Pulse Rate 87  Resp 10  BP (!) 177/100  SpO2 93 %  O2 Device Room Air  Oxygen Therapy  Patient Activity (if Appropriate) In bed  Pulse Oximetry Type Continuous  Post Treatment  Dialyzer Clearance Lightly streaked  Duration of HD Treatment -hour(s)  (00:44 mintutes)  Hemodialysis Intake (mL) 600 mL  Liters Processed 12.8  Fluid Removed (mL) 100 mL  Tolerated HD Treatment No (Comment)  Post-Hemodialysis Comments See notes pt requested to end tx.

## 2023-02-02 NOTE — Progress Notes (Signed)
Physical Therapy Treatment Patient Details Name: Angela [name] MRN: [id number] DOB: [birth year] Today's Date: 02/02/2023   History of Present Illness Angela [name] is a 37 y.o. female with history of ESRD on HD, DM, HTN, seizures, HLD, GERD, hypothyroidism, anxiety and depression who presents from dialysis center for evaluation of AMS and syncopal episode.  Left BG ICH with IVH s/p EVD, etiology:  uncontrolled hypertension.    PT Comments    Patient progressing to hallway ambulation this session though with limited activity tolerance and decreased safety/imbalance due to R LE weakness, scissoring and poor attention/awareness.  Mother present today and able to provide home information and that she can assist at d/c  [name] remains an excellent candidate for acute inpatient rehab prior to d/c home.    Recommendations for follow up therapy are one component of a multi-disciplinary discharge planning process, led by the attending physician.  Recommendations may be updated based on patient status, additional functional criteria and insurance authorization.  Follow Up Recommendations  Acute inpatient rehab (3hours/day)     Assistance Recommended at Discharge Frequent or constant Supervision/Assistance  Patient can return home with the following Two people to help with walking and/or transfers;Assistance with cooking/housework;Assist for transportation;Direct supervision/assist for medications management;Help with stairs or ramp for entrance;Two people to help with bathing/dressing/bathroom   Equipment Recommendations  Other (comment) (TBA)    Recommendations for Other Services       Precautions / Restrictions Precautions Precautions: Fall Precaution Comments: EVD (now clamped) Restrictions Weight Bearing Restrictions: No     Mobility  Bed Mobility Overal bed mobility: Needs Assistance Bed Mobility: Supine to Sit   Sidelying to sit: Min assist, HOB elevated Supine to sit: Min  assist     General bed mobility comments: hand hold to sit up and assist for legs/positioning to supine, assist for multiple lines as well    Transfers Overall transfer level: Needs assistance Equipment used: Rolling walker (2 wheels) Transfers: Sit to/from Stand, Bed to chair/wheelchair/BSC Sit to Stand: Mod assist, +2 safety/equipment Stand pivot transfers: Mod assist, +2 safety/equipment         General transfer comment: +2 for lines with EVD, coretrack, IV and telemetry; up to [facility] initially +BM and then stood to RW with assist for hygiene due to lines    Ambulation/Gait Ambulation/Gait assistance: Mod assist, +2 safety/equipment Gait Distance (Feet): 100 Feet (with one seated rest) Assistive device: Rolling walker (2 wheels) Gait Pattern/deviations: Step-to pattern, Decreased stride length, Knees buckling, Knee flexed in stance - right, Shuffle, Wide base of support, Trunk flexed       General Gait Details: cues for posture, assist for walker safety, chair follow, for preventing LOB due to R knee buckle; cues for forward gaze and R hip ext in stance   Stairs             Wheelchair Mobility    Modified Rankin (Stroke Patients Only) Modified Rankin (Stroke Patients Only) Pre-Morbid Rankin Score: No symptoms Modified Rankin: Moderately severe disability     Balance Overall balance assessment: Needs assistance Sitting-balance support: Feet supported Sitting balance-Leahy Scale: Fair     Standing balance support: Bilateral upper extremity supported Standing balance-Leahy Scale: Poor Standing balance comment: min A with RW for balance                            Cognition Arousal/Alertness: Lethargic Behavior During Therapy: Impulsive Overall Cognitive Status: Impaired/Different from baseline Area of Impairment: Attention,  Following commands, Problem solving, Safety/judgement                   Current Attention Level: Sustained Memory:  Decreased short-term memory, Decreased recall of precautions Following Commands: Follows one step commands consistently, Follows one step commands with increased time Safety/Judgement: Decreased awareness of safety, Decreased awareness of deficits   Problem Solving: Slow processing          Exercises      General Comments General comments (skin integrity, edema, etc.): mother present and able to give home set up info; agreeable to AIR prior to home where she can assist some (has fibromyalgia)      Pertinent Vitals/Pain Pain Assessment Pain Assessment: Faces Faces Pain Scale: Hurts little more Pain Location: LEs Pain Descriptors / Indicators: Discomfort Pain Intervention(s): Monitored during session, Repositioned    Home Living Family/patient expects to be discharged to:: Private residence Living Arrangements: Parent Available Help at Discharge: Family;Available 24 hours/day Type of Home: House Home Access: Stairs to enter Entrance Stairs-Rails: Psychiatric nurse of Steps: 10   Home Layout: One level Home Equipment: None      Prior Function            PT Goals (current goals can now be found in the care plan section) Progress towards PT goals: Progressing toward goals    Frequency    Min 4X/week      PT Plan Current plan remains appropriate    Co-evaluation PT/OT/SLP Co-Evaluation/Treatment: Yes Reason for Co-Treatment: To address functional/ADL transfers;For patient/therapist safety;Necessary to address cognition/behavior during functional activity PT goals addressed during session: Mobility/safety with mobility;Balance;Proper use of DME;Strengthening/ROM        AM-PAC PT "6 Clicks" Mobility   Outcome Measure  Help needed turning from your back to your side while in a flat bed without using bedrails?: A Little Help needed moving from lying on your back to sitting on the side of a flat bed without using bedrails?: A Little Help needed  moving to and from a bed to a chair (including a wheelchair)?: A Lot Help needed standing up from a chair using your arms (e.g., wheelchair or bedside chair)?: A Lot Help needed to walk in hospital room?: A Lot Help needed climbing 3-5 steps with a railing? : Total 6 Click Score: 13    End of Session Equipment Utilized During Treatment: Gait belt Activity Tolerance: Patient tolerated treatment well Patient left: in bed;with call bell/phone within reach;with family/visitor present;with bed alarm set   PT Visit Diagnosis: Other abnormalities of gait and mobility (R26.89);Muscle weakness (generalized) (M62.81);Other symptoms and signs involving the nervous system (R29.898)     Time: [id number] PT Time Calculation (min) (ACUTE ONLY): 40 min  Charges:  $Gait Training: 8-22 mins $Therapeutic Activity: 8-22 mins                     .vy    Reginia Naas 02/02/2023, 11:57 AM

## 2023-02-03 ENCOUNTER — Inpatient Hospital Stay (HOSPITAL_COMMUNITY): Payer: Medicaid Other

## 2023-02-03 DIAGNOSIS — I619 Nontraumatic intracerebral hemorrhage, unspecified: Secondary | ICD-10-CM | POA: Diagnosis not present

## 2023-02-03 LAB — RENAL FUNCTION PANEL
Albumin: 2.8 g/dL — ABNORMAL LOW (ref 3.5–5.0)
Anion gap: 17 — ABNORMAL HIGH (ref 5–15)
BUN: 45 mg/dL — ABNORMAL HIGH (ref 6–20)
CO2: 21 mmol/L — ABNORMAL LOW (ref 22–32)
Calcium: 9.9 mg/dL (ref 8.9–10.3)
Chloride: 93 mmol/L — ABNORMAL LOW (ref 98–111)
Creatinine, Ser: 7.3 mg/dL — ABNORMAL HIGH (ref 0.44–1.00)
GFR, Estimated: 7 mL/min — ABNORMAL LOW (ref >60)
Glucose, Bld: 376 mg/dL — ABNORMAL HIGH (ref 70–99)
Phosphorus: 6.5 mg/dL — ABNORMAL HIGH (ref 2.5–4.6)
Potassium: 4.7 mmol/L (ref 3.5–5.1)
Sodium: 131 mmol/L — ABNORMAL LOW (ref 135–145)

## 2023-02-03 LAB — GLUCOSE, CAPILLARY
Glucose-Capillary: 354 mg/dL — ABNORMAL HIGH (ref 70–99)
Glucose-Capillary: 360 mg/dL — ABNORMAL HIGH (ref 70–99)
Glucose-Capillary: 224 mg/dL — ABNORMAL HIGH (ref 70–99)
Glucose-Capillary: 88 mg/dL (ref 70–99)
Glucose-Capillary: 98 mg/dL (ref 70–99)
Glucose-Capillary: 139 mg/dL — ABNORMAL HIGH (ref 70–99)

## 2023-02-03 MED ORDER — LANTHANUM CARBONATE 500 MG PO CHEW
1000.0000 mg | CHEWABLE_TABLET | Freq: Three times a day (TID) | ORAL | Status: DC
  Administered 2023-02-03 – 2023-02-04 (×3): 1000 mg via ORAL
  Filled 2023-02-03 (×4): qty 2

## 2023-02-03 MED ORDER — ESCITALOPRAM OXALATE 10 MG PO TABS
20.0000 mg | ORAL_TABLET | Freq: Every day | ORAL | Status: DC
  Administered 2023-02-04: 20 mg via ORAL
  Filled 2023-02-03: qty 2

## 2023-02-03 MED ORDER — BUSPIRONE HCL 15 MG PO TABS
15.0000 mg | ORAL_TABLET | Freq: Every day | ORAL | Status: DC
  Administered 2023-02-03: 15 mg via ORAL
  Filled 2023-02-03: qty 1

## 2023-02-03 MED ORDER — DILTIAZEM HCL 60 MG PO TABS
60.0000 mg | ORAL_TABLET | Freq: Four times a day (QID) | ORAL | Status: DC
  Administered 2023-02-03 – 2023-02-04 (×4): 60 mg via ORAL
  Filled 2023-02-03 (×4): qty 1

## 2023-02-03 MED ORDER — LEVETIRACETAM 100 MG/ML PO SOLN
500.0000 mg | Freq: Two times a day (BID) | ORAL | Status: DC
  Administered 2023-02-03 (×2): 500 mg
  Filled 2023-02-03 (×2): qty 5

## 2023-02-03 MED ORDER — RENA-VITE PO TABS
1.0000 | ORAL_TABLET | Freq: Every day | ORAL | Status: DC
  Administered 2023-02-03: 1 via ORAL
  Filled 2023-02-03 (×2): qty 1

## 2023-02-03 MED ORDER — SENNOSIDES-DOCUSATE SODIUM 8.6-50 MG PO TABS: 1.0000 | ORAL_TABLET | Freq: Two times a day (BID) | ORAL | Status: DC

## 2023-02-03 MED ORDER — DARBEPOETIN ALFA 100 MCG/0.5ML IJ SOSY
100.0000 ug | PREFILLED_SYRINGE | INTRAMUSCULAR | Status: DC
  Administered 2023-02-03: 100 ug via SUBCUTANEOUS
  Filled 2023-02-03: qty 0.5

## 2023-02-03 MED ORDER — LEVOTHYROXINE SODIUM 75 MCG PO TABS
175.0000 ug | ORAL_TABLET | Freq: Every morning | ORAL | Status: DC
  Administered 2023-02-04: 175 ug via ORAL
  Filled 2023-02-03: qty 1

## 2023-02-03 MED ORDER — GABAPENTIN 100 MG PO CAPS
100.0000 mg | ORAL_CAPSULE | Freq: Three times a day (TID) | ORAL | Status: DC
  Administered 2023-02-03 – 2023-02-04 (×2): 100 mg via ORAL
  Filled 2023-02-03 (×3): qty 1

## 2023-02-03 MED ORDER — INSULIN GLARGINE-YFGN 100 UNIT/ML ~~LOC~~ SOLN
12.0000 [IU] | Freq: Every day | SUBCUTANEOUS | Status: DC
  Administered 2023-02-04: 12 [IU] via SUBCUTANEOUS
  Filled 2023-02-03: qty 0.12

## 2023-02-03 MED ORDER — AMLODIPINE BESYLATE 10 MG PO TABS
10.0000 mg | ORAL_TABLET | Freq: Every day | ORAL | Status: DC
  Administered 2023-02-04: 10 mg via ORAL
  Filled 2023-02-03: qty 1

## 2023-02-03 MED ORDER — INSULIN ASPART 100 UNIT/ML IJ SOLN
3.0000 [IU] | INTRAMUSCULAR | Status: DC
  Administered 2023-02-03: 3 [IU] via SUBCUTANEOUS

## 2023-02-03 MED ORDER — HYDRALAZINE HCL 50 MG PO TABS
100.0000 mg | ORAL_TABLET | Freq: Three times a day (TID) | ORAL | Status: DC
  Administered 2023-02-03 – 2023-02-04 (×3): 100 mg via ORAL
  Filled 2023-02-03 (×3): qty 2
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
## 2023-02-03 NOTE — Progress Notes (Signed)
Patient ID: Angela [name], female   DOB: [birth year], 37 y.o.   MRN: [id number] No change in exam with ventric clamped for 24hrs. HCT shows no hydro and significant clearing of IVH, no blood in 3rd or 4th. Will d/c ventric and sign off, call if we can help in any way

## 2023-02-03 NOTE — Progress Notes (Signed)
Speech Language Pathology Treatment: Dysphagia  Patient Details Name: Angela [name] MRN: [id number] DOB: [birth year] Today's Date: 02/03/2023 Time: [id number] SLP Time Calculation (min) (ACUTE ONLY): 17 min  Assessment / Plan / Recommendation Clinical Impression  Pt was seen for dysphagia treatment after pt removed her Cortrak. Upon inquiry, pt stated that she was unaware of what the tube was or its purpose, but that it was making her nose itch so she removed it. Mastication was [facility] for dysphagia 3 boluses, but prolonged with regular textures and a liquid wash was necessary with the latter consistency to eliminate oral residue. Pt tolerated dysphagia 3 solids and nectar thick liquids without overt s/s of aspiration. Throat clearing and intermittent coughing were noted with thin liquids via cup and straw, dual consistency boluses (i.e., peaches with juice) and with regular texture solids. A dysphagia 3 diet with nectar thick liquids is recommended at this time. SLP will follow to assess improvement in swallow function and for instrumental assessment if symptoms persist.    HPI HPI: Pt is a 37 y.o. female who presented from dialysis center for evaluation of AMS. Pt was found obtunded, and she was intubated. ETT 2/9-2/12. CT head 2/9: intraparenchymal hematoma centered at the anterior left basal ganglia with intraventricular extension. Pt s/p EVD 2/9; clamped 2/15 and removed 2/16. Pt removed Cortrak 2/16. PMH: ESRD on HD, DM, HTN, seizures, HLD, GERD, hypothyroidism, anxiety and depression.      SLP Plan  Continue with current plan of care      Recommendations for follow up therapy are one component of a multi-disciplinary discharge planning process, led by the attending physician.  Recommendations may be updated based on patient status, additional functional criteria and insurance authorization.    Recommendations  Diet recommendations: Dysphagia 3 (mechanical soft);Nectar-thick liquid Liquids  provided via: Cup;Straw Medication Administration: Crushed with puree (or whole with puree) Supervision: Staff to assist with self feeding Compensations: Slow rate;Small sips/bites Postural Changes and/or Swallow Maneuvers: Seated upright 90 degrees                Oral Care Recommendations: Oral care BID Follow Up Recommendations: Acute inpatient rehab (3hours/day) Assistance recommended at discharge: Frequent or constant Supervision/Assistance SLP Visit Diagnosis: Dysphagia, unspecified (R13.10) Plan: Continue with current plan of care          Quenten Nawaz I. Hardin Negus, [place], [place] Office number [phone]  Horton Marshall  02/03/2023, 4:12 PM

## 2023-02-03 NOTE — Progress Notes (Signed)
Patient had 3.25 hrs of HD tx. Tx tolerated well. Patient was restless during the treatment. [place] of 846m fluid removal achieved.

## 2023-02-03 NOTE — Progress Notes (Signed)
SLP Cancellation Note  Patient Details Name: Angela [name] MRN: [id number] DOB: Nov 24, 1986   Cancelled treatment:       Reason Eval/Treat Not Completed: Patient at procedure or test/unavailable (Pt still having dialysis at this time. SLP will follow up later as schedule allows, but anticipates that radiology would be unable to have an MBS done today if one is clinically indicated.)  Lois Ostrom I. Hardin Negus, [place], [place] Office number [phone]  Horton Marshall 02/03/2023, 12:55 PM

## 2023-02-03 NOTE — Progress Notes (Signed)
Patient was sitting on the side of the bed when this RN entered the room. Patient had cortrak in her hand and had pulled it out entirely. Patient is scheduled to see speech this afternoon. After speech evaluation, this RN will reevaluate the need for NG tube at this time. If NG is needed for medication administration it will be reinserted.   Normajean Baxter, RN

## 2023-02-03 NOTE — Progress Notes (Signed)
Patient ID: Angela [name], female   DOB: August 04, 1986, 37 y.o.   MRN: [id number] [facility] Progress Note   Assessment/ Plan:   1.  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  Continues to show improving mentation/recovery. 2. ESRD: She is on hemodialysis that we will continue on a Monday/Wednesday/Friday schedule with next treatment ordered for today with cautious UF. 3. Anemia: No overt blood loss noted with acceptable hemoglobin and hematocrit, will start her on erythropoietin with dialysis. 4. CKD-MBD: Elevated phosphorus level noted with marginally elevated corrected calcium. Increased/restarted scheduled binders (on TFs). 5.  Acute hypoxic respiratory failure: Secondary to altered mental status in the setting of intracranial bleed.  Resolved/extubated and remains on room air without supplemental oxygen. 6. Hypertension: Blood pressures under decent control on amlodipine (via NGT) with as needed IV hydralazine/labetalol that has been transitioned from intravenous clevidipine.  Subjective:   Irritated by NGT and plans noted for swallow evaluation today. No acute events overnight.    Objective:   BP (!) 140/79 (BP Location: Right Arm)   Pulse 99   Temp 98.5 F (36.9 C) (Oral)   Resp (!) 9   Ht 5' 5"$  (1.651 m)   Wt 63.8 kg   SpO2 95%   BMI 23.41 kg/m   Physical Exam: Gen: Awake, alert and appears comfortable. NGT and mittens in place CVS: Pulse regular rhythm, normal rate, S1 and S2 normal Resp: Clear to auscultation bilaterally, no rales/rhonchi Abd: Soft, flat, nontender Ext: Left upper arm AV fistula with intact dressings, no lower extremity edema.  trace upper extremity edema noted  Labs: BMET Recent Labs  Lab 01/28/23 1509 01/28/23 1630 01/29/23 0342 01/29/23 1843 01/30/23 0357 01/31/23 0548 02/01/23 0601 02/01/23 1315 02/02/23 0446  02/02/23 1803 02/03/23 0426  NA 140  --  141  --  137 133* 135  --  135  --  131*  K 3.5  --  4.4  --  4.4 4.1 4.9  --  4.0  --  4.7  CL 105  --  106  --  98 92* 95*  --  96*  --  93*  CO2 25  --  23  --  26 25 25  $ --  23  --  21*  GLUCOSE 64*  --  170*  --  158* 363* 173*  --  86  --  376*  BUN 18  --  23*  --  23* 31* 37*  --  32*  --  45*  CREATININE 4.62*  --  5.21*  --  4.62* 4.80* 6.40*  --  5.95*  --  7.30*  CALCIUM 9.4  --  9.7  --  9.8 9.7 9.6  --  9.5  --  9.9  PHOS  --    < > 4.7* 4.6  --   --  7.1* 6.4* 5.5*  5.5* 6.3* 6.5*   < > = values in this interval not displayed.   CBC Recent Labs  Lab 01/27/23 1536 01/27/23 1547 01/29/23 0342 01/30/23 0357 01/31/23 0548 02/01/23 0601  WBC 9.1   < > 13.0* 14.8* 14.2* 11.7*  NEUTROABS 6.8  --   --   --   --   --   HGB 7.5*   < > 10.0* 8.1* 8.2* 8.6*  HCT 23.9*   < > 30.4* 25.5* 26.7* 27.1*  MCV 93.7   < > 89.7 91.4 94.0 91.9  PLT 124*   < >  129* 103* 128* 167   < > = values in this interval not displayed.    Medications:     amLODipine  10 mg Per Tube Daily   busPIRone  15 mg Per Tube QHS   Chlorhexidine Gluconate Cloth  6 each Topical Daily   diltiazem  60 mg Per Tube Q6H   epoetin alfa-epbx (RETACRIT) injection  3,000 Units Intravenous Q M,W,F-HD   escitalopram  20 mg Per Tube Daily   feeding supplement (PROSource TF20)  60 mL Per Tube Daily   gabapentin  100 mg Per Tube Q8H   heparin injection (subcutaneous)  5,000 Units Subcutaneous Q8H   hydrALAZINE  100 mg Per Tube Q8H   insulin aspart  0-9 Units Subcutaneous Q4H   insulin glargine-yfgn  10 Units Subcutaneous Daily   lanthanum  1,000 mg Per Tube TID with meals   levETIRAcetam  500 mg Per Tube BID   levothyroxine  175 mcg Per Tube q morning   multivitamin  1 tablet Per Tube QHS   pantoprazole (PROTONIX) IV  40 mg Intravenous QHS   polyethylene glycol  17 g Per Tube Daily   senna-docusate  1 tablet Per Tube BID   Elmarie Shiley, MD 02/03/2023, 9:09 AM

## 2023-02-03 NOTE — Inpatient Diabetes Management (Signed)
Inpatient Diabetes Program Recommendations  AACE/ADA: New Consensus Statement on Inpatient Glycemic Control   Target Ranges:  Prepandial:   less than 140 mg/dL      Peak postprandial:   less than 180 mg/dL (1-2 hours)      Critically ill patients:  140 - 180 mg/dL    Latest Reference Range & Units 02/02/23 04:15 02/02/23 08:11 02/02/23 11:43 02/02/23 15:48 02/02/23 19:22 02/02/23 23:18 02/03/23 03:27 02/03/23 08:06  Glucose-Capillary 70 - 99 mg/dL 73 137 (H) 252 (H) 133 (H) 172 (H) 234 (H) 354 (H) 360 (H)   Review of Glycemic Control  Diabetes history: Type 1 DM Outpatient Diabetes medications: Novolog 2-8 units tid with meals,Tresiba 12 units daily Current orders for Inpatient glycemic control: Novolog 0-9 units q 4 hours, Semglee 10 units daily; Vital @ 50 ml/hr  Inpatient Diabetes Program Recommendations:    Insulin: Anticipate hyperglycemia due to tube feedings. Please consider increasing Semglee to 12 units daily and ordering Novolog 6 units Q4H for tube feeding coverage. If tube feeding is stopped or held then Novolog tube feeding coverage should also be stopped or held.  Thanks, Barnie Alderman, RN, MSN, [place] Diabetes Coordinator Inpatient Diabetes Program [phone] (Team Pager from 8am to [place])

## 2023-02-03 NOTE — Progress Notes (Signed)
SLP Cancellation Note  Patient Details Name: Angela [name] MRN: [id number] DOB: 08/13/1986   Cancelled treatment:       Reason Eval/Treat Not Completed: Patient at procedure or test/unavailable (Pt currently having dialysis and dialysis RN requested that the session be deferred. Pt's bedside RN agreed to contact this SLP when the pt is available.)  Angela [name], [place], [place] Office number [phone]  Horton Marshall 02/03/2023, 9:53 AM

## 2023-02-03 NOTE — Progress Notes (Signed)
Inpatient Rehab Admissions Coordinator:  Await medical clearance for CIR admission. Will continue to follow.   Gayland Curry, [place], [place] Admissions Coordinator [phone]

## 2023-02-03 NOTE — Progress Notes (Signed)
STROKE TEAM PROGRESS NOTE   INTERVAL HISTORY Patient is seen in her room with no family at the bedside.  She is doing well.   She is awake alert and interactive and following commands well.  She had ventriculostomy catheter clamped for 24 hours without neurological worsening and follow-up CT scan from this morning in fact shows decrease in ventricular size and normal hydrocephalus.  Ventriculostomy catheter was discontinued this morning by neurosurgery.  Vital signs stable.  Blood pressure adequately controlled  Vitals:   02/03/23 1330 02/03/23 1336 02/03/23 1350 02/03/23 1357  BP: (!) 159/78 (!) 151/79 (!) 178/90   Pulse: 77 75 84   Resp: (!) 7 (!) 8 (!) 9   Temp:   98.3 F (36.8 C)   TempSrc:   Axillary   SpO2: 98% 96% 97%   Weight:    62.8 kg  Height:       CBC:  Recent Labs  Lab 01/27/23 1536 01/27/23 1547 01/31/23 0548 02/01/23 0601  WBC 9.1   < > 14.2* 11.7*  NEUTROABS 6.8  --   --   --   HGB 7.5*   < > 8.2* 8.6*  HCT 23.9*   < > 26.7* 27.1*  MCV 93.7   < > 94.0 91.9  PLT 124*   < > 128* 167   < > = values in this interval not displayed.   Basic Metabolic Panel:  Recent Labs  Lab 02/02/23 0446 02/02/23 1803 02/03/23 0426  NA 135  --  131*  K 4.0  --  4.7  CL 96*  --  93*  CO2 23  --  21*  GLUCOSE 86  --  376*  BUN 32*  --  45*  CREATININE 5.95*  --  7.30*  CALCIUM 9.5  --  9.9  MG 2.2 2.1  --   PHOS 5.5*  5.5* 6.3* 6.5*   Lipid Panel:  Recent Labs  Lab 01/28/23 0300 01/30/23 0357 01/31/23 0548  CHOL 247*  --   --   TRIG 65  64   < > 88  HDL 119  --   --   CHOLHDL 2.1  --   --   VLDL 13  --   --   LDLCALC 115*  --   --    < > = values in this interval not displayed.   HgbA1c:  Recent Labs  Lab 01/28/23 0300  HGBA1C 12.0*   Urine Drug Screen:  Recent Labs  Lab 01/28/23 0715  LABOPIA NONE DETECTED  COCAINSCRNUR NONE DETECTED  LABBENZ NONE DETECTED  AMPHETMU NONE DETECTED  THCU POSITIVE*  LABBARB NONE DETECTED    Alcohol Level   Recent Labs  Lab 01/27/23 1833  ETH <10    IMAGING past 24 hours CT HEAD WO CONTRAST (5MM)  Result Date: 02/03/2023 CLINICAL DATA:  Hydrocephalus surveillance EXAM: CT HEAD WITHOUT CONTRAST TECHNIQUE: Contiguous axial images were obtained from the base of the skull through the vertex without intravenous contrast. RADIATION DOSE REDUCTION: This exam was performed according to the departmental dose-optimization program which includes automated exposure control, adjustment of the mA and/or kV according to patient size and/or use of iterative reconstruction technique. COMPARISON:  3 days ago FINDINGS: Brain: Subacute hemorrhage at the left caudate head is non progressed, up to the 3 cm on axial images. Intraventricular blood clot has diminished. A right frontal approach EVD is in stable position with no hydrocephalus. No evidence of infarct. Vascular: No hyperdense vessel. Premature atheromatous calcification.  Skull: Normal. Negative for fracture or focal lesion. Sinuses/Orbits: Stable opacification of the paranasal sinuses. Probable large retention cyst in the right maxillary sinus. IMPRESSION: 1. Decreasing intraventricular blood clot.  No hydrocephalus. 2. No re-bleeding at the left caudate hematoma. Electronically Signed   By: Jorje Guild M.D.   On: 02/03/2023 05:33   DG CHEST PORT 1 VIEW  Result Date: 02/02/2023 CLINICAL DATA:  NG tube EXAM: PORTABLE CHEST 1 VIEW COMPARISON:  Chest x-ray 01/28/2023 FINDINGS: Nasogastric tube tip is in the gastric fundus. There is mild cardiac enlargement, unchanged. There is no focal lung consolidation, pleural effusion or pneumothorax. No acute fractures are seen. IMPRESSION: Nasogastric tube tip is in the gastric fundus. Electronically Signed   By: Ronney Asters M.D.   On: 02/02/2023 23:16    PHYSICAL EXAM  Physical Exam  Constitutional: Appears well-developed and well-nourished middle-age African-American lady with ventriculostomy.   Cardiovascular:  Normal rate and regular rhythm.  Respiratory: Regular, unlabored respirations  Neuro: Mental Status: Patient awake and alert, states name, location. Hesitancy of speech. Follows commands. Less restless today Cranial Nerves: Pupils equal round reactive to light, extraocular movements intact, face appears symmetrical, shoulder shrug equal, tongue protrusion midline Motor/sensory Patient moves bilateral upper extremities and lower extremities spontaneously with good antigravity strength  Sensation equal bilaterally. Gait: assisted with walker, slow. unsteady  ASSESSMENT/PLAN Ms. Baby Eagles is a 37 y.o. female with history of ESRD on HD, DM, HTN, seizures, HLD, GERD, hypothyroidism, anxiety and depression who presents from dialysis center for evaluation of AMS and syncopal episode.  EVD was placed for hydrocephalus.  She was extubated on 2/12.  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 CTA head & neck unremarkable 2/10 - Repeat Head CT- s/p EVD. Overall ventricular size is similar with persistent mild hydrocephalus. Stable hematoma 2/12- CT head- 1. Stable right frontal approach EVD with ventricular decompression since 01/27/2023. Left caudate intra-axial hemorrhage suspected with secondary IVH. Estimated intra-axial blood volume of 5-6 mL, stable to mildly decreased since 01/27/2023. IVH not significantly changed. No new intracranial abnormality. 2D Echo 60-65%, left atrium is moderately dilated LDL 115 HgbA1c 12.0 UDS positive for THC VTE prophylaxis - heparin subq No antithrombotic prior to admission, now on No antithrombotic.  Therapy recommendations: CLR  disposition: Pending  Acute Hypoxic Respiratory Failure Intubated 2/9 -> 2/12 Goal SpO2 greater than  92%  Hydrocephalus  HTS at 40 -> switch to D10NS -> off  Na 133->138->140->141->133 EVD placed 2/9 by NSGY 10cmH2O->17cmH2O-> 15 cmH2O Clamped 2/15 Repeat CT Head 2/16  Seizure disorder 09/2021 seizure triggered by hypoglycemia, on Keppra Home meds Keppra 500 mg BID (changed from home dose of daily) daily with 266m additional on dialysis days Not sure compliance Reported frequent seizure at home and with HD EEG 2/10:  Continuous slow, generalized, no seizures   Diabetes type I Uncontrolled Hypoglycemia  D10 d/c'd  SSI TF's restarted after having vomiting overnight  Home meds: Insulin HgbA1c 12.0, goal < 7.0 CBGs  Hypertensive emergency Home meds: Norvasc, Cardizem, furosemide, hydralazine, losartan Cleviprex turned off this am Resume home norvasc 10 Stable now BP goal < 160  Hyperlipidemia Home meds pravastatin 20 LDL 115, goal < 70 AST/ALT 63/45 -> 115/58 Hold off statin due to acute ICH and transaminitis Consider resume statin  at discharge  Other Stroke Risk Factors Obesity, Body mass index is 23.04 kg/m., BMI >/= 30 associated with increased stroke risk, recommend weight loss, diet and exercise as appropriate  THC abuse, cessation education will be provided Migraine - followed with Dr. Jaynee Eagles at [facility]  Other Active Problems Hypokalemia, resolved  Replete and repeat BMP, check mag Severe anemia  Hgb 7.5-8.5-6.4 -> 1uPRBC 2/10, ->10.0 -> 8.6 Repeat H/H ESRD with HD MWF HD done 2/9 only received 1 hour her 4 hour session Per her mother she had 5 dialysis sessions last week Nephrology consulted by CCM  Hypothyroidism TSH 201.471 - continue Synthroid Free T4 - 0.77 Anxiety, depression BuSpar, Lexapro Leukocytosis WBC 14.8 -> 11.7 CXR - left lower lobe atelectasis  Afebrile Restlessness, leg pain  Gabapentin 159m q8hr Improved  Hospital day # 7     Patient remains neurologically stable with no ventriculostomy catheter discontinued this morning..   Plan to observe for another 24 hours in the ICU and then consider transfer to floor bed tomorrow if stable.  Mobilize out of bed with therapy.  Long discussion with patient  and answered questions.This patient is critically ill and at significant risk of neurological worsening, death and care requires constant monitoring of vital signs, hemodynamics,respiratory and cardiac monitoring, extensive review of multiple databases, frequent neurological assessment, discussion with family, other specialists and medical decision making of high complexity.I have made any additions or clarifications directly to the above note.This critical care time does not reflect procedure time, or teaching time or supervisory time of PA/NP/Med Resident etc but could involve care discussion time.  I spent 31 minutes of neurocritical care time  in the care of  this patient.      PAntony Contras MD Medical Director [facility] Pager: [phone]/16/2024 2:15 PM

## 2023-02-04 ENCOUNTER — Inpatient Hospital Stay (HOSPITAL_COMMUNITY)
Admission: RE | Admit: 2023-02-04 | Discharge: 2023-02-16 | DRG: 056 | Disposition: A | Discharge status: Home / Self Care | Payer: Medicaid Other | Source: Intra-hospital | Attending: Physical Medicine & Rehabilitation | Admitting: Physical Medicine & Rehabilitation

## 2023-02-04 ENCOUNTER — Other Ambulatory Visit: Payer: Self-pay

## 2023-02-04 ENCOUNTER — Encounter (HOSPITAL_COMMUNITY): Payer: Self-pay | Admitting: Physical Medicine & Rehabilitation

## 2023-02-04 DIAGNOSIS — Z820 Family history of epilepsy and other diseases of the nervous system: Secondary | ICD-10-CM

## 2023-02-04 DIAGNOSIS — I120 Hypertensive chronic kidney disease with stage 5 chronic kidney disease or end stage renal disease: Secondary | ICD-10-CM | POA: Diagnosis present

## 2023-02-04 DIAGNOSIS — R2990 Unspecified symptoms and signs involving the nervous system: Secondary | ICD-10-CM | POA: Diagnosis not present

## 2023-02-04 DIAGNOSIS — E785 Hyperlipidemia, unspecified: Secondary | ICD-10-CM | POA: Diagnosis present

## 2023-02-04 DIAGNOSIS — G40909 Epilepsy, unspecified, not intractable, without status epilepticus: Secondary | ICD-10-CM | POA: Diagnosis present

## 2023-02-04 DIAGNOSIS — E46 Unspecified protein-calorie malnutrition: Secondary | ICD-10-CM | POA: Diagnosis present

## 2023-02-04 DIAGNOSIS — E1022 Type 1 diabetes mellitus with diabetic chronic kidney disease: Secondary | ICD-10-CM | POA: Diagnosis present

## 2023-02-04 DIAGNOSIS — F39 Unspecified mood [affective] disorder: Secondary | ICD-10-CM | POA: Diagnosis present

## 2023-02-04 DIAGNOSIS — I611 Nontraumatic intracerebral hemorrhage in hemisphere, cortical: Secondary | ICD-10-CM | POA: Diagnosis not present

## 2023-02-04 DIAGNOSIS — J9600 Acute respiratory failure, unspecified whether with hypoxia or hypercapnia: Secondary | ICD-10-CM | POA: Diagnosis not present

## 2023-02-04 DIAGNOSIS — E1040 Type 1 diabetes mellitus with diabetic neuropathy, unspecified: Secondary | ICD-10-CM | POA: Diagnosis present

## 2023-02-04 DIAGNOSIS — R531 Weakness: Secondary | ICD-10-CM | POA: Diagnosis present

## 2023-02-04 DIAGNOSIS — I610 Nontraumatic intracerebral hemorrhage in hemisphere, subcortical: Secondary | ICD-10-CM

## 2023-02-04 DIAGNOSIS — I619 Nontraumatic intracerebral hemorrhage, unspecified: Secondary | ICD-10-CM | POA: Diagnosis not present

## 2023-02-04 DIAGNOSIS — K3184 Gastroparesis: Secondary | ICD-10-CM | POA: Diagnosis present

## 2023-02-04 DIAGNOSIS — E039 Hypothyroidism, unspecified: Secondary | ICD-10-CM | POA: Diagnosis present

## 2023-02-04 DIAGNOSIS — Z823 Family history of stroke: Secondary | ICD-10-CM | POA: Diagnosis not present

## 2023-02-04 DIAGNOSIS — I69198 Other sequelae of nontraumatic intracerebral hemorrhage: Principal | ICD-10-CM

## 2023-02-04 DIAGNOSIS — E1049 Type 1 diabetes mellitus with other diabetic neurological complication: Secondary | ICD-10-CM

## 2023-02-04 DIAGNOSIS — I615 Nontraumatic intracerebral hemorrhage, intraventricular: Secondary | ICD-10-CM | POA: Diagnosis not present

## 2023-02-04 DIAGNOSIS — Z8249 Family history of ischemic heart disease and other diseases of the circulatory system: Secondary | ICD-10-CM

## 2023-02-04 DIAGNOSIS — Z87891 Personal history of nicotine dependence: Secondary | ICD-10-CM

## 2023-02-04 DIAGNOSIS — E1065 Type 1 diabetes mellitus with hyperglycemia: Secondary | ICD-10-CM | POA: Diagnosis not present

## 2023-02-04 DIAGNOSIS — E1043 Type 1 diabetes mellitus with diabetic autonomic (poly)neuropathy: Secondary | ICD-10-CM | POA: Diagnosis present

## 2023-02-04 DIAGNOSIS — D631 Anemia in chronic kidney disease: Secondary | ICD-10-CM | POA: Diagnosis present

## 2023-02-04 DIAGNOSIS — I10 Essential (primary) hypertension: Secondary | ICD-10-CM | POA: Diagnosis present

## 2023-02-04 DIAGNOSIS — Z803 Family history of malignant neoplasm of breast: Secondary | ICD-10-CM

## 2023-02-04 DIAGNOSIS — N186 End stage renal disease: Secondary | ICD-10-CM | POA: Diagnosis present

## 2023-02-04 DIAGNOSIS — E103593 Type 1 diabetes mellitus with proliferative diabetic retinopathy without macular edema, bilateral: Secondary | ICD-10-CM | POA: Diagnosis present

## 2023-02-04 DIAGNOSIS — Z79899 Other long term (current) drug therapy: Secondary | ICD-10-CM | POA: Diagnosis not present

## 2023-02-04 DIAGNOSIS — E1159 Type 2 diabetes mellitus with other circulatory complications: Secondary | ICD-10-CM | POA: Diagnosis present

## 2023-02-04 DIAGNOSIS — Z794 Long term (current) use of insulin: Secondary | ICD-10-CM

## 2023-02-04 DIAGNOSIS — Z833 Family history of diabetes mellitus: Secondary | ICD-10-CM

## 2023-02-04 DIAGNOSIS — Z83438 Family history of other disorder of lipoprotein metabolism and other lipidemia: Secondary | ICD-10-CM

## 2023-02-04 DIAGNOSIS — E10649 Type 1 diabetes mellitus with hypoglycemia without coma: Secondary | ICD-10-CM | POA: Diagnosis present

## 2023-02-04 DIAGNOSIS — Z992 Dependence on renal dialysis: Secondary | ICD-10-CM

## 2023-02-04 DIAGNOSIS — I161 Hypertensive emergency: Secondary | ICD-10-CM | POA: Diagnosis not present

## 2023-02-04 DIAGNOSIS — K219 Gastro-esophageal reflux disease without esophagitis: Secondary | ICD-10-CM | POA: Diagnosis present

## 2023-02-04 DIAGNOSIS — M7989 Other specified soft tissue disorders: Secondary | ICD-10-CM | POA: Diagnosis not present

## 2023-02-04 LAB — RENAL FUNCTION PANEL
Albumin: 2.6 g/dL — ABNORMAL LOW (ref 3.5–5.0)
Anion gap: 14 (ref 5–15)
BUN: 25 mg/dL — ABNORMAL HIGH (ref 6–20)
CO2: 25 mmol/L (ref 22–32)
Calcium: 9.8 mg/dL (ref 8.9–10.3)
Chloride: 93 mmol/L — ABNORMAL LOW (ref 98–111)
Creatinine, Ser: 4.58 mg/dL — ABNORMAL HIGH (ref 0.44–1.00)
GFR, Estimated: 12 mL/min — ABNORMAL LOW (ref >60)
Glucose, Bld: 153 mg/dL — ABNORMAL HIGH (ref 70–99)
Phosphorus: 5.3 mg/dL — ABNORMAL HIGH (ref 2.5–4.6)
Potassium: 5 mmol/L (ref 3.5–5.1)
Sodium: 132 mmol/L — ABNORMAL LOW (ref 135–145)

## 2023-02-04 LAB — GLUCOSE, CAPILLARY
Glucose-Capillary: 174 mg/dL — ABNORMAL HIGH (ref 70–99)
Glucose-Capillary: 222 mg/dL — ABNORMAL HIGH (ref 70–99)
Glucose-Capillary: 133 mg/dL — ABNORMAL HIGH (ref 70–99)
Glucose-Capillary: 139 mg/dL — ABNORMAL HIGH (ref 70–99)
Glucose-Capillary: 30 mg/dL — CL (ref 70–99)
Glucose-Capillary: 32 mg/dL — CL (ref 70–99)
Glucose-Capillary: 182 mg/dL — ABNORMAL HIGH (ref 70–99)

## 2023-02-04 LAB — CBC
HCT: 25.3 % — ABNORMAL LOW (ref 36.0–46.0)
Hemoglobin: 7.9 g/dL — ABNORMAL LOW (ref 12.0–15.0)
MCH: 28.4 pg (ref 26.0–34.0)
MCHC: 31.2 g/dL (ref 30.0–36.0)
MCV: 91 fL (ref 80.0–100.0)
Platelets: 140 10*3/uL — ABNORMAL LOW (ref 150–400)
RBC: 2.78 MIL/uL — ABNORMAL LOW (ref 3.87–5.11)
RDW: 14 % (ref 11.5–15.5)
WBC: 8.1 10*3/uL (ref 4.0–10.5)
nRBC: 0 % (ref 0.0–0.2)

## 2023-02-04 MED ORDER — CHLORHEXIDINE GLUCONATE CLOTH 2 % EX PADS
6.0000 | MEDICATED_PAD | Freq: Every day | CUTANEOUS | Status: DC
  Administered 2023-02-05 – 2023-02-16 (×8): 6 via TOPICAL

## 2023-02-04 MED ORDER — DILTIAZEM HCL 30 MG PO TABS
60.0000 mg | ORAL_TABLET | Freq: Four times a day (QID) | ORAL | Status: DC
  Administered 2023-02-04 – 2023-02-16 (×41): 60 mg via ORAL
  Filled 2023-02-04 (×41): qty 2

## 2023-02-04 MED ORDER — GUAIFENESIN-DM 100-10 MG/5ML PO SYRP: 5.0000 mL | ORAL_SOLUTION | Freq: Four times a day (QID) | ORAL | Status: DC | PRN

## 2023-02-04 MED ORDER — AMLODIPINE BESYLATE 10 MG PO TABS: 10.0000 mg | ORAL_TABLET | Freq: Every day | ORAL | 1 refills | Status: DC

## 2023-02-04 MED ORDER — DIPHENHYDRAMINE HCL 12.5 MG/5ML PO ELIX: 12.5000 mg | ORAL_SOLUTION | Freq: Four times a day (QID) | ORAL | Status: DC | PRN

## 2023-02-04 MED ORDER — BUSPIRONE HCL 15 MG PO TABS
15.0000 mg | ORAL_TABLET | Freq: Every day | ORAL | Status: DC
  Administered 2023-02-04 – 2023-02-15 (×12): 15 mg via ORAL
  Filled 2023-02-04 (×12): qty 1

## 2023-02-04 MED ORDER — GABAPENTIN 100 MG PO CAPS
100.0000 mg | ORAL_CAPSULE | Freq: Three times a day (TID) | ORAL | Status: DC
  Administered 2023-02-04 – 2023-02-16 (×30): 100 mg via ORAL
  Filled 2023-02-04 (×32): qty 1

## 2023-02-04 MED ORDER — GABAPENTIN 100 MG PO CAPS: 100.0000 mg | ORAL_CAPSULE | Freq: Three times a day (TID) | ORAL | 1 refills | Status: DC

## 2023-02-04 MED ORDER — LEVETIRACETAM 500 MG PO TABS
500.0000 mg | ORAL_TABLET | Freq: Two times a day (BID) | ORAL | Status: DC
  Administered 2023-02-04 – 2023-02-16 (×23): 500 mg via ORAL
  Filled 2023-02-04 (×23): qty 1

## 2023-02-04 MED ORDER — PENTAFLUOROPROP-TETRAFLUOROETH EX AERO: 1.0000 | INHALATION_SPRAY | CUTANEOUS | Status: DC | PRN

## 2023-02-04 MED ORDER — LEVOTHYROXINE SODIUM 75 MCG PO TABS
175.0000 ug | ORAL_TABLET | Freq: Every morning | ORAL | Status: DC
  Administered 2023-02-05 – 2023-02-16 (×12): 175 ug via ORAL
  Filled 2023-02-04 (×12): qty 1

## 2023-02-04 MED ORDER — LEVETIRACETAM 500 MG PO TABS
500.0000 mg | ORAL_TABLET | Freq: Two times a day (BID) | ORAL | Status: DC
  Administered 2023-02-04: 500 mg via ORAL
  Filled 2023-02-04: qty 1

## 2023-02-04 MED ORDER — DEXTROSE 50 % IV SOLN: 25.0000 g | INTRAVENOUS | Status: AC

## 2023-02-04 MED ORDER — HEPARIN SODIUM (PORCINE) 5000 UNIT/ML IJ SOLN
5000.0000 [IU] | Freq: Three times a day (TID) | INTRAMUSCULAR | Status: DC
  Administered 2023-02-04 – 2023-02-15 (×27): 5000 [IU] via SUBCUTANEOUS
  Filled 2023-02-04 (×32): qty 1

## 2023-02-04 MED ORDER — PROCHLORPERAZINE MALEATE 5 MG PO TABS
5.0000 mg | ORAL_TABLET | Freq: Four times a day (QID) | ORAL | Status: DC | PRN
  Administered 2023-02-12: 10 mg via ORAL
  Filled 2023-02-04: qty 2

## 2023-02-04 MED ORDER — DARBEPOETIN ALFA 100 MCG/0.5ML IJ SOSY: 100.0000 ug | PREFILLED_SYRINGE | INTRAMUSCULAR | 0 refills | Status: DC

## 2023-02-04 MED ORDER — DARBEPOETIN ALFA 100 MCG/0.5ML IJ SOSY
100.0000 ug | PREFILLED_SYRINGE | INTRAMUSCULAR | Status: DC
  Administered 2023-02-10: 100 ug via SUBCUTANEOUS
  Filled 2023-02-04: qty 0.5

## 2023-02-04 MED ORDER — HEPARIN SODIUM (PORCINE) 5000 UNIT/ML IJ SOLN: 5000.0000 [IU] | Freq: Three times a day (TID) | INTRAMUSCULAR | Status: DC

## 2023-02-04 MED ORDER — LANTHANUM CARBONATE 500 MG PO CHEW
1000.0000 mg | CHEWABLE_TABLET | Freq: Three times a day (TID) | ORAL | Status: DC
  Administered 2023-02-05 – 2023-02-14 (×27): 1000 mg via ORAL
  Filled 2023-02-04 (×37): qty 2

## 2023-02-04 MED ORDER — ESCITALOPRAM OXALATE 10 MG PO TABS
20.0000 mg | ORAL_TABLET | Freq: Every day | ORAL | Status: DC
  Administered 2023-02-05 – 2023-02-16 (×11): 20 mg via ORAL
  Filled 2023-02-04 (×12): qty 2

## 2023-02-04 MED ORDER — INSULIN ASPART 100 UNIT/ML IJ SOLN: 0.0000 [IU] | INTRAMUSCULAR | Status: DC

## 2023-02-04 MED ORDER — SORBITOL 70 % SOLN: 30.0000 mL | Freq: Every day | Status: DC | PRN

## 2023-02-04 MED ORDER — SENNOSIDES-DOCUSATE SODIUM 8.6-50 MG PO TABS
1.0000 | ORAL_TABLET | Freq: Two times a day (BID) | ORAL | Status: DC
  Administered 2023-02-04 – 2023-02-14 (×20): 1 via ORAL
  Filled 2023-02-04 (×23): qty 1

## 2023-02-04 MED ORDER — RENA-VITE PO TABS
1.0000 | ORAL_TABLET | Freq: Every day | ORAL | Status: DC
  Administered 2023-02-04 – 2023-02-15 (×12): 1 via ORAL
  Filled 2023-02-04 (×12): qty 1

## 2023-02-04 MED ORDER — PANTOPRAZOLE SODIUM 40 MG IV SOLR
40.0000 mg | Freq: Every day | INTRAVENOUS | Status: DC
  Administered 2023-02-04 – 2023-02-06 (×3): 40 mg via INTRAVENOUS
  Filled 2023-02-04 (×3): qty 10

## 2023-02-04 MED ORDER — PROCHLORPERAZINE 25 MG RE SUPP: 12.5000 mg | Freq: Four times a day (QID) | RECTAL | Status: DC | PRN

## 2023-02-04 MED ORDER — SENNOSIDES-DOCUSATE SODIUM 8.6-50 MG PO TABS: 1.0000 | ORAL_TABLET | Freq: Every evening | ORAL | Status: DC | PRN

## 2023-02-04 MED ORDER — DEXTROSE 50 % IV SOLN
INTRAVENOUS | Status: AC
  Administered 2023-02-04: 25 g via INTRAVENOUS
  Filled 2023-02-04: qty 50

## 2023-02-04 MED ORDER — LIDOCAINE-PRILOCAINE 2.5-2.5 % EX CREA: 1.0000 | TOPICAL_CREAM | CUTANEOUS | Status: DC | PRN

## 2023-02-04 MED ORDER — TRAZODONE HCL 50 MG PO TABS: 25.0000 mg | ORAL_TABLET | Freq: Every evening | ORAL | Status: DC | PRN

## 2023-02-04 MED ORDER — DILTIAZEM HCL 60 MG PO TABS: 60.0000 mg | ORAL_TABLET | Freq: Four times a day (QID) | ORAL | 1 refills | Status: DC

## 2023-02-04 MED ORDER — POLYETHYLENE GLYCOL 3350 17 G PO PACK: 17.0000 g | PACK | Freq: Every day | ORAL | Status: DC

## 2023-02-04 MED ORDER — LIDOCAINE HCL (PF) 1 % IJ SOLN: 5.0000 mL | INTRAMUSCULAR | Status: DC | PRN

## 2023-02-04 MED ORDER — ACETAMINOPHEN 325 MG PO TABS
325.0000 mg | ORAL_TABLET | ORAL | Status: DC | PRN
  Administered 2023-02-05 – 2023-02-10 (×8): 650 mg via ORAL
  Filled 2023-02-04 (×8): qty 2

## 2023-02-04 MED ORDER — HYDRALAZINE HCL 50 MG PO TABS
100.0000 mg | ORAL_TABLET | Freq: Three times a day (TID) | ORAL | Status: DC
  Administered 2023-02-04 – 2023-02-16 (×31): 100 mg via ORAL
  Filled 2023-02-04 (×32): qty 2

## 2023-02-04 MED ORDER — METHOCARBAMOL 500 MG PO TABS
500.0000 mg | ORAL_TABLET | Freq: Four times a day (QID) | ORAL | Status: DC | PRN
  Administered 2023-02-09: 500 mg via ORAL
  Filled 2023-02-04 (×2): qty 1

## 2023-02-04 MED ORDER — INSULIN GLARGINE-YFGN 100 UNIT/ML ~~LOC~~ SOLN
12.0000 [IU] | Freq: Every day | SUBCUTANEOUS | Status: DC
  Filled 2023-02-04: qty 0.12

## 2023-02-04 MED ORDER — LANTHANUM CARBONATE 1000 MG PO CHEW: 1000.0000 mg | CHEWABLE_TABLET | Freq: Three times a day (TID) | ORAL | 1 refills | Status: DC

## 2023-02-04 MED ORDER — LEVETIRACETAM 500 MG PO TABS: 500.0000 mg | ORAL_TABLET | Freq: Two times a day (BID) | ORAL | 1 refills | Status: DC

## 2023-02-04 MED ORDER — PROCHLORPERAZINE EDISYLATE 10 MG/2ML IJ SOLN: 5.0000 mg | Freq: Four times a day (QID) | INTRAMUSCULAR | Status: DC | PRN

## 2023-02-04 MED ORDER — AMLODIPINE BESYLATE 10 MG PO TABS
10.0000 mg | ORAL_TABLET | Freq: Every day | ORAL | Status: DC
  Administered 2023-02-05 – 2023-02-07 (×3): 10 mg via ORAL
  Filled 2023-02-04 (×4): qty 1

## 2023-02-04 NOTE — Progress Notes (Signed)
Occupational Therapy Treatment Patient Details Name: Angela [name] MRN: [id number] DOB: 26-Nov-1986 Today's Date: 02/04/2023   History of present illness Angela [name] is a 37 y.o. female with history of ESRD on HD, DM, HTN, seizures, HLD, GERD, hypothyroidism, anxiety and depression who presents from dialysis center for evaluation of AMS and syncopal episode.  Left BG ICH with IVH s/p EVD, etiology:  uncontrolled hypertension.   OT comments  Pt currently at min assist level for simulated selfcare, transfers, and mobility using the RW for support.  Decreased orientation with flat affect, and some slower problem solving.  Feel she is making steady progress with OT at this time and will benefit from acute care OT to continue progression with anticipation of transfer to AIR for more intense therapy.    Recommendations for follow up therapy are one component of a multi-disciplinary discharge planning process, led by the attending physician.  Recommendations may be updated based on patient status, additional functional criteria and insurance authorization.    Follow Up Recommendations  Acute inpatient rehab (3hours/day)     Assistance Recommended at Discharge Frequent or constant Supervision/Assistance  Patient can return home with the following  A little help with walking and/or transfers;A little help with bathing/dressing/bathroom;Assistance with cooking/housework;Assist for transportation;Help with stairs or ramp for entrance;Direct supervision/assist for medications management   Equipment Recommendations  Other (comment)       Precautions / Restrictions Precautions Precautions: Fall Precaution Comments: left side weakness Restrictions Weight Bearing Restrictions: No       Mobility Bed Mobility Overal bed mobility: Needs Assistance Bed Mobility: Supine to Sit Rolling: Supervision Sidelying to sit: HOB elevated, Min guard            Transfers Overall transfer level: Needs  assistance Equipment used: Rolling walker (2 wheels) Transfers: Sit to/from Stand, Bed to chair/wheelchair/BSC Sit to Stand: +2 safety/equipment, Min assist Stand pivot transfers: Min assist         General transfer comment: Occasional LOB to the right with right knee buckling during mobility in the hallway.     Balance Overall balance assessment: Needs assistance Sitting-balance support: Feet supported Sitting balance-Leahy Scale: Fair Sitting balance - Comments: Able to sit EOB without LOB   Standing balance support: Bilateral upper extremity supported Standing balance-Leahy Scale: Poor Standing balance comment: Needs UE support for balance with standing.                           ADL either performed or assessed with clinical judgement   ADL Overall ADL's : Needs assistance/impaired                     Lower Body Dressing: Minimal assistance;Sit to/from stand Lower Body Dressing Details (indicate cue type and reason): gripper socks Toilet Transfer: Minimal assistance;BSC/3in1;Ambulation;Rolling walker (2 wheels) Toilet Transfer Details (indicate cue type and reason): simulated secondary to pt declining to participate         Functional mobility during ADLs: Minimal assistance;Rolling walker (2 wheels) General ADL Comments: Pt able to transfer to the EOB with min guard assist and maintain balance at supervision level.  Vitals stable throughout session.  Pt with decreased awareness of reason for being in the hospital and deficits from [place].      Cognition Arousal/Alertness: Lethargic Behavior During Therapy: Impulsive Overall Cognitive Status: Impaired/Different from baseline Area of Impairment: Attention, Following commands, Problem solving, Safety/judgement, Orientation, Memory, Awareness  Orientation Level: Disoriented to, Place, Situation, Time (not oriented to day of the week) Current Attention Level: Selective Memory:  Decreased short-term memory, Decreased recall of precautions Following Commands: Follows one step commands consistently, Follows one step commands with increased time Safety/Judgement: Decreased awareness of safety, Decreased awareness of deficits Awareness: Intellectual Problem Solving: Slow processing, Requires verbal cues General Comments: Pt not able to state place after approximately 10 min delay.  Sleepy with eyes closed to start session but with sitting EOB, she was able to arouse better and maintain her attention.          Frequency  Min 2X/week        Progress Toward Goals  OT Goals(current goals can now be found in the care plan section)  Progress towards OT goals: Progressing toward goals  Acute Rehab OT Goals Patient Stated Goal: Pt did not state during session Time For Goal Achievement: 02/14/23 Potential to Achieve Goals: Good  Plan Discharge plan remains appropriate;Frequency remains appropriate    Co-evaluation      Reason for Co-Treatment: To address functional/ADL transfers;For patient/therapist safety;Necessary to address cognition/behavior during functional activity PT goals addressed during session: Mobility/safety with mobility;Balance;Proper use of DME;Strengthening/ROM OT goals addressed during session: ADL's and self-care;Strengthening/ROM;Proper use of Adaptive equipment and DME      AM-PAC OT "6 Clicks" Daily Activity     Outcome Measure   Help from another person eating meals?: None Help from another person taking care of personal grooming?: A Little Help from another person toileting, which includes using toliet, bedpan, or urinal?: A Little Help from another person bathing (including washing, rinsing, drying)?: A Little Help from another person to put on and taking off regular upper body clothing?: None Help from another person to put on and taking off regular lower body clothing?: A Little 6 Click Score: 20    End of Session Equipment  Utilized During Treatment: Gait belt;Rolling walker (2 wheels)  OT Visit Diagnosis: Unsteadiness on feet (R26.81);Muscle weakness (generalized) (M62.81);Other symptoms and signs involving cognitive function   Activity Tolerance Patient tolerated treatment well   Patient Left in chair;with call bell/phone within reach;with chair alarm set;with family/visitor present   Nurse Communication Mobility status        Time: 0945-1006 OT Time Calculation (min): 21 min  Charges: OT General Charges $OT Visit: 1 Visit OT Treatments $Self Care/Home Management : 8-22 mins   Clyda Greener, OTR/L [place]  Office [phone] 02/04/2023

## 2023-02-04 NOTE — Progress Notes (Signed)
Hypoglycemic Event  CBG: 32  Treatment: D50 50 mL (25 gm)  Symptoms: None  Follow-up CBG: Time:17:00 CBG Result:139  Possible Reasons for Event: Unknown  Comments/MD notified:yes, Dr.Raulkar MD will implement orders    Shaivi Rothschild R

## 2023-02-04 NOTE — Progress Notes (Signed)
This nurse called into room by bedside nurse with report of low blood sugar of 32 validated to chart another glucometer obtained to recheck for accuracy  with 30 mg/dl result hypoglycemia standing orders for adults with diabetes protocol followed with patient on nectar thick fluids and the severity of hypoglycemia D50 25g/50 ml administered  per orders hypoglycemic event note in chart
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
## 2023-02-04 NOTE — TOC Transition Note (Signed)
Transition of Care [facility]) - CM/SW Discharge Note   Patient Details  Name: Angela [name] MRN: [id number] Date of Birth: [birth year]  [facility] CM/SW Contact:  Tom-Johnson, Renea Ee, RN Phone Number: 02/04/2023, 11:34 AM   Clinical Narrative:     Patient is scheduled for discharge to CIR today. No further TOC needs noted.         Final next level of care: IP Rehab Facility Barriers to Discharge: Barriers Resolved   Patient Goals and CMS Choice CMS Medicare.gov Compare Post Acute Care list provided to:: Patient Choice offered to / list presented to : Patient  Discharge Placement                  Patient to be transferred to facility by: In-Hospital transfer      Discharge Plan and Services Additional resources added to the After Visit Summary for                  DME Arranged: N/A DME Agency: NA       HH Arranged: NA HH Agency: NA        Social Determinants of Health (SDOH) Interventions SDOH Screenings   Food Insecurity: No Food Insecurity (01/25/2022)  Housing: High Risk (01/06/2022)  Transportation Needs: No Transportation Needs (01/24/2022)  Depression (PHQ2-9): High Risk (01/24/2022)  Financial Resource Strain: Medium Risk (01/20/2022)  Physical Activity: Inactive (10/29/2019)  Tobacco Use: Medium Risk (11/17/2022)     Readmission Risk Interventions    10/18/2021    1:09 PM  Readmission Risk Prevention Plan  Transportation Screening Complete  PCP or Specialist Appt within 3-5 Days Complete  HRI or [place] Complete  Social Work Consult for [place] Planning/Counseling Complete  Palliative Care Screening Complete  Medication Review Press photographer) Referral to Pharmacy

## 2023-02-04 NOTE — Progress Notes (Signed)
[facility] Progress Note   Subjective:  Seen in room - no overnight complaints, no CP/dyspnea. Per notes, she removed her NG tube yesterday but looks like ok for soft diet. Dialyzed yesterday - no major issues, 843m net UF.  Objective Vitals:   02/04/23 0601 02/04/23 0700 02/04/23 0800 02/04/23 0900  BP: (!) 166/93 (!) 150/83 (!) 122/57 (!) 153/90  Pulse: 92 92    Resp: 15 17 15 12  $ Temp:  98.4 F (36.9 C)    TempSrc:  Axillary    SpO2: 96% 96% 95% 95%  Weight:      Height:       Physical Exam General: Chronically ill appearing woman, NAD. Room air. Heart: RRR; no murmur Lungs: CTA anteriorly Abdomen: soft Extremities: no LE edema Dialysis Access: LUE AVF + bruit  Additional Objective Labs: Basic Metabolic Panel: Recent Labs  Lab 02/02/23 0446 02/02/23 1803 02/03/23 0426 02/04/23 0258  NA 135  --  131* 132*  K 4.0  --  4.7 5.0  CL 96*  --  93* 93*  CO2 23  --  21* 25  GLUCOSE 86  --  376* 153*  BUN 32*  --  45* 25*  CREATININE 5.95*  --  7.30* 4.58*  CALCIUM 9.5  --  9.9 9.8  PHOS 5.5*  5.5* 6.3* 6.5* 5.3*   Liver Function Tests: Recent Labs  Lab 01/29/23 0342 02/01/23 0601 02/02/23 0446 02/03/23 0426 02/04/23 0258  AST 115*  --   --   --   --   ALT 58*  --   --   --   --   ALKPHOS 174*  --   --   --   --   BILITOT 0.5  --   --   --   --   PROT 6.5  --   --   --   --   ALBUMIN 3.0*   < > 2.9* 2.8* 2.6*   < > = values in this interval not displayed.   CBC: Recent Labs  Lab 01/29/23 0342 01/30/23 0357 01/31/23 0548 02/01/23 0601 02/04/23 0258  WBC 13.0* 14.8* 14.2* 11.7* 8.1  HGB 10.0* 8.1* 8.2* 8.6* 7.9*  HCT 30.4* 25.5* 26.7* 27.1* 25.3*  MCV 89.7 91.4 94.0 91.9 91.0  PLT 129* 103* 128* 167 140*   Blood Culture    Component Value Date/Time   SDES BLOOD BLOOD LEFT HAND 01/27/2023 1836   SPECREQUEST  01/27/2023 1836    BOTTLES DRAWN AEROBIC AND ANAEROBIC Blood Culture adequate volume   CULT  01/27/2023 1836    NO  GROWTH 5 DAYS Performed at [facility] [id number] [place] [id number]   REPTSTATUS 02/01/2023 FINAL 01/27/2023 1836   Studies/Results: CT HEAD WO CONTRAST (5MM)  Result Date: 02/03/2023 CLINICAL DATA:  Hydrocephalus surveillance EXAM: CT HEAD WITHOUT CONTRAST TECHNIQUE: Contiguous axial images were obtained from the base of the skull through the vertex without intravenous contrast. RADIATION DOSE REDUCTION: This exam was performed according to the departmental dose-optimization program which includes automated exposure control, adjustment of the mA and/or kV according to patient size and/or use of iterative reconstruction technique. COMPARISON:  3 days ago FINDINGS: Brain: Subacute hemorrhage at the left caudate head is non progressed, up to the 3 cm on axial images. Intraventricular blood clot has diminished. A right frontal approach EVD is in stable position with no hydrocephalus. No evidence of infarct. Vascular: No hyperdense vessel. Premature atheromatous calcification. Skull: Normal.  Negative for fracture or focal lesion. Sinuses/Orbits: Stable opacification of the paranasal sinuses. Probable large retention cyst in the right maxillary sinus. IMPRESSION: 1. Decreasing intraventricular blood clot.  No hydrocephalus. 2. No re-bleeding at the left caudate hematoma. Electronically Signed   By: Jorje Guild M.D.   On: 02/03/2023 05:33   DG CHEST PORT 1 VIEW  Result Date: 02/02/2023 CLINICAL DATA:  NG tube EXAM: PORTABLE CHEST 1 VIEW COMPARISON:  Chest x-ray 01/28/2023 FINDINGS: Nasogastric tube tip is in the gastric fundus. There is mild cardiac enlargement, unchanged. There is no focal lung consolidation, pleural effusion or pneumothorax. No acute fractures are seen. IMPRESSION: Nasogastric tube tip is in the gastric fundus. Electronically Signed   By: Ronney Asters M.D.   On: 02/02/2023 23:16    Medications:  promethazine (PHENERGAN) injection (IM or IVPB) Stopped (01/31/23  0705)    amLODipine  10 mg Oral Daily   busPIRone  15 mg Oral QHS   Chlorhexidine Gluconate Cloth  6 each Topical Daily   darbepoetin (ARANESP) injection - DIALYSIS  100 mcg Subcutaneous Q Fri-1800   diltiazem  60 mg Oral Q6H   escitalopram  20 mg Oral Daily   gabapentin  100 mg Oral TID   heparin injection (subcutaneous)  5,000 Units Subcutaneous Q8H   hydrALAZINE  100 mg Oral Q8H   insulin aspart  0-9 Units Subcutaneous Q4H   insulin glargine-yfgn  12 Units Subcutaneous Daily   lanthanum  1,000 mg Oral TID with meals   levETIRAcetam  500 mg Oral BID   levothyroxine  175 mcg Oral q morning   multivitamin  1 tablet Oral QHS   pantoprazole (PROTONIX) IV  40 mg Intravenous QHS   [START ON 02/05/2023] polyethylene glycol  17 g Oral Daily   senna-docusate  1 tablet Oral BID    Dialysis Orders: MWF [facility] [address] 3.5hr, EDW 62kg, 2K/2.5Ca bath, LUE AVF, Prev was getting Hep 2600 + 500 u/hr - now held  Assessment/Plan: 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 Slowly improving mentation/recovery. ESRD: Continue HD on MWF schedule - next 2/19. 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 Phos improving, continue Fosrenol as binder. Acute hypoxic respiratory failure: Secondary to altered mental status in the setting of intracranial bleed.  Resolved/extubated and remains on room air without supplemental oxygen. Hypertension: BP decent, on amlodipine with as needed IV hydralazine/labetalol that has been transitioned from intravenous clevidipine. Dispo: Plan is transfer to CIR soon.  Veneta Penton, PA-C 02/04/2023, 10:06 AM  [employer]
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
## 2023-02-04 NOTE — Progress Notes (Signed)
Patient alert and oriented and verbally responsive this shift. Patient oriented to floor and made aware of plan of care. Patient denies pain or distress. Low CBG noticed this shift after patient admitted to unit and Md  and charge nurse made aware. Patient remains on nectar thickened liquids and foods encouraged throughout shift. No other hypoglycemia episodes this shift. Will continue to monitor

## 2023-02-04 NOTE — Progress Notes (Signed)
Physical Therapy Treatment Patient Details Name: Angela [name] MRN: [id number] DOB: August 06, 1986 Today's Date: 02/04/2023   History of Present Illness Angela [name] is a 37 y.o. female with history of ESRD on HD, DM, HTN, seizures, HLD, GERD, hypothyroidism, anxiety and depression who presents from dialysis center for evaluation of AMS and syncopal episode.  Left BG ICH with IVH s/p EVD, etiology:  uncontrolled hypertension.    PT Comments    Patient lethargic initially but with incr alertness once sitting upright. Requires RW and +2 mod assist for ambulation due to Rt knee buckles at times. Requires cues for upright posture and proximity to RW. Able to carry on conversation and answer questions while ambulating.     Recommendations for follow up therapy are one component of a multi-disciplinary discharge planning process, led by the attending physician.  Recommendations may be updated based on patient status, additional functional criteria and insurance authorization.  Follow Up Recommendations  Acute inpatient rehab (3hours/day)     Assistance Recommended at Discharge Frequent or constant Supervision/Assistance  Patient can return home with the following Two people to help with walking and/or transfers;Assistance with cooking/housework;Assist for transportation;Direct supervision/assist for medications management;Help with stairs or ramp for entrance;Two people to help with bathing/dressing/bathroom   Equipment Recommendations  Other (comment) (TBA)    Recommendations for Other Services       Precautions / Restrictions Precautions Precautions: Fall Precaution Comments: left side weakness Restrictions Weight Bearing Restrictions: No     Mobility  Bed Mobility Overal bed mobility: Needs Assistance Bed Mobility: Supine to Sit   Sidelying to sit: HOB elevated, Min guard       General bed mobility comments: guarding for safety and lines    Transfers Overall transfer  level: Needs assistance Equipment used: Rolling walker (2 wheels) Transfers: Sit to/from Stand Sit to Stand: +2 safety/equipment, Min assist           General transfer comment: pt came up quickly with anterior imbalance    Ambulation/Gait Ambulation/Gait assistance: Mod assist, +2 safety/equipment Gait Distance (Feet): 45 Feet (standing rest; 150) Assistive device: Rolling walker (2 wheels) Gait Pattern/deviations: Step-to pattern, Decreased stride length, Knees buckling, Knee flexed in stance - right, Wide base of support, Trunk flexed, Decreased dorsiflexion - left   Gait velocity interpretation: 1.31 - 2.62 ft/sec, indicative of limited community ambulator   General Gait Details: cues for posture, assist for walker safety, +2 for preventing LOB due to R knee buckle numerous times; as fatigued, Rt foot dragging   Stairs             Wheelchair Mobility    Modified Rankin (Stroke Patients Only) Modified Rankin (Stroke Patients Only) Pre-Morbid Rankin Score: No symptoms Modified Rankin: Moderately severe disability     Balance Overall balance assessment: Needs assistance Sitting-balance support: Feet supported Sitting balance-Leahy Scale: Fair Sitting balance - Comments: can sit unsupported, but due to impulsive and restless kept a hand on at all times   Standing balance support: Bilateral upper extremity supported Standing balance-Leahy Scale: Poor Standing balance comment: min A with RW for balance                            Cognition Arousal/Alertness: Lethargic Behavior During Therapy: Impulsive Overall Cognitive Status: Impaired/Different from baseline Area of Impairment: Attention, Following commands, Problem solving, Safety/judgement, Orientation, Memory, Awareness                 Orientation Level:  Disoriented to, Place, Situation Current Attention Level: Selective Memory: Decreased short-term memory, Decreased recall of  precautions Following Commands: Follows one step commands consistently, Follows one step commands with increased time Safety/Judgement: Decreased awareness of safety, Decreased awareness of deficits Awareness: Intellectual Problem Solving: Slow processing, Requires verbal cues General Comments: Could not recall location after educated twice during session; final attempt she did look at PTs badge and stated [facility] and knew this was a hospital        Exercises General Exercises - Lower Extremity Ankle Circles/Pumps: AROM, Right, 10 reps Straight Leg Raises: AROM, Right, 10 reps Other Exercises Other Exercises: Heel to shin RLE and LLE with decr coordination/strength noted RLE    General Comments        Pertinent Vitals/Pain Pain Assessment Pain Assessment: Faces Faces Pain Scale: No hurt    Home Living                          Prior Function            PT Goals (current goals can now be found in the care plan section) Acute Rehab PT Goals Patient Stated Goal: unable to state Time For Goal Achievement: 02/14/23 Potential to Achieve Goals: Good Progress towards PT goals: Progressing toward goals    Frequency    Min 4X/week      PT Plan Current plan remains appropriate    Co-evaluation PT/OT/SLP Co-Evaluation/Treatment: Yes Reason for Co-Treatment: To address functional/ADL transfers;For patient/therapist safety;Necessary to address cognition/behavior during functional activity PT goals addressed during session: Mobility/safety with mobility;Balance;Proper use of DME;Strengthening/ROM        AM-PAC PT "6 Clicks" Mobility   Outcome Measure  Help needed turning from your back to your side while in a flat bed without using bedrails?: A Little Help needed moving from lying on your back to sitting on the side of a flat bed without using bedrails?: A Little Help needed moving to and from a bed to a chair (including a wheelchair)?: A Lot Help needed standing  up from a chair using your arms (e.g., wheelchair or bedside chair)?: A Lot Help needed to walk in hospital room?: Total Help needed climbing 3-5 steps with a railing? : Total 6 Click Score: 12    End of Session Equipment Utilized During Treatment: Gait belt Activity Tolerance: Patient tolerated treatment well (VSS per monitor) Patient left: with call bell/phone within reach;in chair;with chair alarm set Nurse Communication: Mobility status PT Visit Diagnosis: Other abnormalities of gait and mobility (R26.89);Muscle weakness (generalized) (M62.81);Other symptoms and signs involving the nervous system (R29.898)     Time: [id number] PT Time Calculation (min) (ACUTE ONLY): 24 min  Charges:  $Gait Training: 8-22 mins                      [place]  Office [phone]    Rexanne Mano 02/04/2023, 10:20 AM

## 2023-02-04 NOTE — Progress Notes (Signed)
Inpatient Rehabilitation Admission Medication Review by a Pharmacist  A complete drug regimen review was completed for this patient to identify any potential clinically significant medication issues.  High Risk Drug Classes Is patient taking? Indication by Medication  Antipsychotic Yes Compazine - N/V  Anticoagulant Yes Heparin - DVT px  Antibiotic No   Opioid No   Antiplatelet No   Hypoglycemics/insulin Yes SSI/Semglee - DM  Vasoactive Medication Yes Amlodipine/diltiazem/hydralazine - HTN  Chemotherapy No   Other Yes Aranesp - anemia Gabapentin - neuropathy Lexapro - depression Buspirone - anxiety Synthroid - hypothyroidism Robaxin - spasms Protonix - GERD Trazodone - sleep Fosrenol - phosphate bider Keppra - seizure     Type of Medication Issue Identified Description of Issue Recommendation(s)  Drug Interaction(s) (clinically significant)     Duplicate Therapy     Allergy     No Medication Administration End Date     Incorrect Dose     Additional Drug Therapy Needed     Significant med changes from prior encounter (inform family/care partners about these prior to discharge).    Other  Albuterol Resume at dc as needed    Clinically significant medication issues were identified that warrant physician communication and completion of prescribed/recommended actions by midnight of the next day:  No  Name of provider notified for urgent issues identified:   Provider Method of Notification:     Pharmacist comments:   Time spent performing this drug regimen review (minutes):  Dresser, PharmD, [place], AAHIVP, CPP Infectious Disease Pharmacist 02/04/2023 3:54 PM

## 2023-02-04 NOTE — Discharge Summary (Cosign Needed)
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, etiology: uncontrolled hypertension  Principal Problem:   Cerebral hemorrhage ([place])   Medications to be continued on Rehab Allergies as of 02/04/2023       Reactions   Ferumoxytol Itching   Lisinopril Other (See Comments)   hyperkalemia   Sulfamethoxazole Hives, Itching   Trimethoprim Hives        Medication List     STOP taking these medications    diltiazem 240 MG 24 hr capsule Commonly known as: CARDIZEM CD   furosemide 40 MG tablet Commonly known as: LASIX   pravastatin 20 MG tablet Commonly known as: PRAVACHOL       TAKE these medications    albuterol 108 (90 Base) MCG/ACT inhaler Commonly known as: Proventil HFA Inhale 2 puffs into the lungs every 6 (six) hours as needed for wheezing or shortness of breath.   amLODipine 10 MG tablet Commonly known as: NORVASC Take 1 tablet (10 mg total) by mouth daily. Start taking on: February 05, 2023 What changed:  when to take this additional instructions Another medication with the same name was removed. Continue taking this medication, and follow the directions you see here.   busPIRone 15 MG tablet Commonly known as: BUSPAR TAKE ONE TABLET BY MOUTH EVERYDAY AT BEDTIME What changed: See the new instructions.   Comfort EZ Pen Needles 32G X 4 MM Misc Generic drug: Insulin Pen Needle USE TO INJECT insulin ONCE DAILY AS DIRECTED   Darbepoetin Alfa 100 MCG/0.5ML Sosy injection Commonly known as: ARANESP Inject 0.5 mLs (100 mcg total) into the skin every Friday at 6 PM. Start taking on: February 10, 2023   Dexcom G6 Sensor  Misc APPLY 1 SENSOR EVERY 10 DAYS   Dexcom G6 Sensor Misc SMARTSIG:1 Topical Every 10 Days   Dexcom G6 Transmitter Misc See admin instructions.   diltiazem 60 MG tablet Commonly known as: CARDIZEM Take 1 tablet (60 mg total) by mouth every 6 (six) hours.   escitalopram 20 MG tablet Commonly known as: LEXAPRO TAKE ONE TABLET BY MOUTH ONCE DAILY   gabapentin 100 MG capsule Commonly known as: NEURONTIN Take 1 capsule (100 mg total) by mouth 3 (three) times daily. What changed:  medication strength See the new instructions.   hydrALAZINE 100 MG tablet Commonly known as: APRESOLINE Take 100 mg by mouth 3 (three) times daily.   Insulin Syringe-Needle U-100 31G X 15/64" 1 ML Misc Commonly known as: BD Insulin Syringe Ultrafine Used to give daily insulin injections.   lanthanum 1000 MG chewable tablet Commonly known as: FOSRENOL Chew 1 tablet (1,000 mg total) by mouth with breakfast, with lunch, and with evening meal.   levETIRAcetam 500 MG tablet Commonly known as: KEPPRA Take 1 tablet (500 mg total) by mouth 2 (two) times daily. What changed:  medication strength how much to take how to take this when to take this additional instructions Another medication with the same name was removed. Continue taking this medication, and follow the directions you see here.   levothyroxine 175 MCG tablet Commonly known as: SYNTHROID Take 175 mcg by mouth every morning.   losartan  50 MG tablet Commonly known as: COZAAR Take 1.5 tablets (75 mg total) by mouth daily. What changed:  how much to take when to take this   multivitamin Tabs tablet Take 1 tablet by mouth at bedtime.   NovoLOG FlexPen 100 UNIT/ML FlexPen Generic drug: insulin aspart Inject 2-8 Units into the skin 3 (three) times daily with meals. Per sliding scale What changed: Another medication with the same name was removed. Continue taking this medication, and follow the directions you see here.   ondansetron 4  MG disintegrating tablet Commonly known as: ZOFRAN-ODT 68m ODT q4 hours prn nausea/vomit What changed:  how much to take how to take this when to take this reasons to take this additional instructions   pantoprazole 40 MG tablet Commonly known as: PROTONIX Take 1 tablet (40 mg total) by mouth daily.   TTyler AasFlexTouch 100 UNIT/ML FlexTouch Pen Generic drug: insulin degludec Inject 12 Units into the skin daily.        LABORATORY STUDIES CBC    Component Value Date/Time   WBC 8.1 02/04/2023 0258   RBC 2.78 (L) 02/04/2023 0258   HGB 7.9 (L) 02/04/2023 0258   HGB 8.0 (L) 12/28/2020 1432   HCT 25.3 (L) 02/04/2023 0258   HCT 25.5 (L) 12/28/2020 1432   PLT 140 (L) 02/04/2023 0258   PLT 146 (L) 12/28/2020 1432   MCV 91.0 02/04/2023 0258   MCV 88 12/28/2020 1432   MCH 28.4 02/04/2023 0258   MCHC 31.2 02/04/2023 0258   RDW 14.0 02/04/2023 0258   RDW 11.9 12/28/2020 1432   LYMPHSABS 1.5 01/27/2023 1536   LYMPHSABS 2.3 05/21/2018 1707   MONOABS 0.5 01/27/2023 1536   EOSABS 0.2 01/27/2023 1536   EOSABS 0.0 05/21/2018 1707   BASOSABS 0.1 01/27/2023 1536   BASOSABS 0.0 05/21/2018 1707   CMP    Component Value Date/Time   NA 132 (L) 02/04/2023 0258   NA 136 12/28/2020 1432   K 5.0 02/04/2023 0258   CL 93 (L) 02/04/2023 0258   CO2 25 02/04/2023 0258   GLUCOSE 153 (H) 02/04/2023 0258   BUN 25 (H) 02/04/2023 0258   BUN 42 (H) 12/28/2020 1432   CREATININE 4.58 (H) 02/04/2023 0258   CREATININE 2.31 (H) 02/04/2016 1703   CALCIUM 9.8 02/04/2023 0258   PROT 6.5 01/29/2023 0342   PROT 5.9 (L) 03/11/2022 1247   ALBUMIN 2.6 (L) 02/04/2023 0258   ALBUMIN 3.9 12/28/2020 1432   AST 115 (H) 01/29/2023 0342   ALT 58 (H) 01/29/2023 0342   ALKPHOS 174 (H) 01/29/2023 0342   BILITOT 0.5 01/29/2023 0342   BILITOT <0.2 12/28/2020 1432   GFRNONAA 12 (L) 02/04/2023 0258   GFRNONAA 28 (L) 02/04/2016 1703   GFRAA 10 (L) 12/28/2020 1432   GFRAA 32 (L) 02/04/2016 1703   COAGS Lab  Results  Component Value Date   INR 0.96 09/21/2017   INR 1.07 08/25/2017   Lipid Panel    Component Value Date/Time   CHOL 247 (H) 01/28/2023 0300   CHOL 239 (H) 12/28/2020 1432   TRIG 88 01/31/2023 0548   HDL 119 01/28/2023 0300   HDL 72 12/28/2020 1432   CHOLHDL 2.1 01/28/2023 0300   VLDL 13 01/28/2023 0300   LDLCALC 115 (H) 01/28/2023 0300   LDLCALC 151 (H) 12/28/2020 1432   HgbA1C  Lab Results  Component Value Date   HGBA1C 12.0 (H) 01/28/2023   Urinalysis    Component Value Date/Time   COLORURINE STRAW (A) 12/16/2022  [place] 12/16/2022 1414   APPEARANCEUR Clear 04/30/2019 1141   LABSPEC 1.013 12/16/2022 1414   PHURINE 7.0 12/16/2022 1414   GLUCOSEU >=500 (A) 12/16/2022 1414   GLUCOSEU 100 (A) 10/14/2009 2309   HGBUR NEGATIVE 12/16/2022 1414   BILIRUBINUR NEGATIVE 12/16/2022 1414   BILIRUBINUR Negative 04/30/2019 1141   BILIRUBINUR negative 12/30/2016 [place] 12/16/2022 1414   PROTEINUR >=300 (A) 12/16/2022 1414   UROBILINOGEN 0.2 07/19/2018 1351   UROBILINOGEN 0.2 10/24/2015 0029   NITRITE NEGATIVE 12/16/2022 1414   LEUKOCYTESUR NEGATIVE 12/16/2022 1414   Urine Drug Screen     Component Value Date/Time   LABOPIA NONE DETECTED 01/28/2023 0715   COCAINSCRNUR NONE DETECTED 01/28/2023 0715   LABBENZ NONE DETECTED 01/28/2023 0715   AMPHETMU NONE DETECTED 01/28/2023 0715   THCU POSITIVE (A) 01/28/2023 0715   LABBARB NONE DETECTED 01/28/2023 0715    Alcohol Level    Component Value Date/Time   ETH <10 01/27/2023 1833     SIGNIFICANT DIAGNOSTIC STUDIES CT HEAD WO CONTRAST (5MM)  Result Date: 02/03/2023 CLINICAL DATA:  Hydrocephalus surveillance EXAM: CT HEAD WITHOUT CONTRAST TECHNIQUE: Contiguous axial images were obtained from the base of the skull through the vertex without intravenous contrast. RADIATION DOSE REDUCTION: This exam was performed according to the departmental dose-optimization program which includes  automated exposure control, adjustment of the mA and/or kV according to patient size and/or use of iterative reconstruction technique. COMPARISON:  3 days ago FINDINGS: Brain: Subacute hemorrhage at the left caudate head is non progressed, up to the 3 cm on axial images. Intraventricular blood clot has diminished. A right frontal approach EVD is in stable position with no hydrocephalus. No evidence of infarct. Vascular: No hyperdense vessel. Premature atheromatous calcification. Skull: Normal. Negative for fracture or focal lesion. Sinuses/Orbits: Stable opacification of the paranasal sinuses. Probable large retention cyst in the right maxillary sinus. IMPRESSION: 1. Decreasing intraventricular blood clot.  No hydrocephalus. 2. No re-bleeding at the left caudate hematoma. Electronically Signed   By: Jorje Guild M.D.   On: 02/03/2023 05:33   DG CHEST PORT 1 VIEW  Result Date: 02/02/2023 CLINICAL DATA:  NG tube EXAM: PORTABLE CHEST 1 VIEW COMPARISON:  Chest x-ray 01/28/2023 FINDINGS: Nasogastric tube tip is in the gastric fundus. There is mild cardiac enlargement, unchanged. There is no focal lung consolidation, pleural effusion or pneumothorax. No acute fractures are seen. IMPRESSION: Nasogastric tube tip is in the gastric fundus. Electronically Signed   By: Ronney Asters M.D.   On: 02/02/2023 23:16   DG Abd Portable 1V  Result Date: 02/01/2023 CLINICAL DATA:  Feeding tube placement. EXAM: PORTABLE ABDOMEN - 1 VIEW COMPARISON:  January 27, 2023. FINDINGS: Distal tip of feeding tube is seen in the proximal stomach. IMPRESSION: Distal tip of feeding tube is seen in the proximal stomach. Electronically Signed   By: Marijo Conception M.D.   On: 02/01/2023 12:08   CT HEAD WO CONTRAST (5MM)  Result Date: 01/31/2023 CLINICAL DATA:  Stroke, follow-up EXAM: CT HEAD WITHOUT CONTRAST TECHNIQUE: Contiguous axial images were obtained from the base of the skull through the vertex without intravenous contrast.  RADIATION DOSE REDUCTION: This exam was performed according to the departmental dose-optimization program which includes automated exposure control, adjustment of the mA and/or kV according to patient size and/or use of iterative reconstruction technique. COMPARISON:  01/30/2023 FINDINGS: Brain: Right frontal approach ventriculostomy catheter terminates in the right frontal horn, unchanged. Further slight interval decrease in the  size of the ventricles, which is most notable when comparing the size of left temporal horn to the prior exam. The volume of intraventricular hemorrhage appears unchanged. Redemonstrated parenchymal hemorrhage left caudate, which measures approximately 2.9 x 2.0 x 2.5 cm (AP x TR x CC) (series 3, image 15 and series 5, image 27), previously 3.0 x 2.0 x 2.4 cm, overall unchanged. Unchanged mass effect on the lateral ventricles, with 6 mm of left-to-right midline shift at the level the septum pellucidum, unchanged. No acute infarct.  Gray-white differentiation is preserved. Vascular: No hyperdense vessel. Atherosclerotic calcifications in the intracranial carotid and vertebral arteries. Skull: Right frontal burr hole.  No acute fracture or focal lesion. Sinuses/Orbits: Near complete opacification of the right maxillary sinus. Small air-fluid level in the left maxillary sinus. Additional air-fluid levels in the bilateral ethmoid air cells, bilateral sphenoid sinuses, and right frontal sinus. Other: The mastoids are well aerated. IMPRESSION: 1. Further slight interval decrease in the size of the ventricles, which is most notable when comparing the size of the left temporal horn to the prior exam. The volume of intraventricular hemorrhage appears unchanged. 2. Unchanged parenchymal hemorrhage centered in the left caudate, with 6 mm of left-to-right midline shift. Electronically Signed   By: Merilyn Baba M.D.   On: 01/31/2023 03:08   CT HEAD WO CONTRAST (5MM)  Result Date:  01/30/2023 CLINICAL DATA:  37 year old female dialysis patient who presented with altered mental status and hemorrhage into the ventricular system on 01/27/2023. Status post EVD. EXAM: CT HEAD WITHOUT CONTRAST TECHNIQUE: Contiguous axial images were obtained from the base of the skull through the vertex without intravenous contrast. RADIATION DOSE REDUCTION: This exam was performed according to the departmental dose-optimization program which includes automated exposure control, adjustment of the mA and/or kV according to patient size and/or use of iterative reconstruction technique. COMPARISON:  Head CTs 01/27/2023 and earlier. FINDINGS: Brain: Right anterior frontal approach ventriculostomy catheter terminates in the frontal horn as before. Decreased ventricle size since 2323 hours on 01/27/2023. Volume of intraventricular hemorrhage appears stable., and the studies suggest intra-axial blood thin the left caudate head also, probably the hemorrhage origin. Intra-axial blood products size and configuration stable to slightly smaller, estimated intra-axial volume 5-6 mL. Stable regional mass effect including mild rightward midline shift at the anterior septum pellucidum. Stable gray-white differentiation elsewhere. No new intracranial hemorrhage or ischemia identified. Vascular: Calcified atherosclerosis at the skull base. Skull: Stable.  No acute osseous abnormality identified. Sinuses/Orbits: Fluid in the visible pharynx. Patient is intubated on portable chest X am yesterday. Bilateral paranasal sinus fluid levels and opacification. Tympanic cavities and mastoids remain clear. Other: Broad-based right scalp hematoma superimposed on recent postoperative changes from external ventricular drain. Orbits soft tissues appear to remain negative. IMPRESSION: 1. Stable right frontal approach EVD with ventricular decompression since 01/27/2023. 2. Left caudate intra-axial hemorrhage suspected with secondary IVH. Estimated  intra-axial blood volume of 5-6 mL, stable to mildly decreased since 01/27/2023. IVH not significantly changed. 3. No new intracranial abnormality. 4. Intubated with fluid in the pharynx and paranasal sinuses. Electronically Signed   By: [name] M.D.   On: 01/30/2023 05:57   EEG adult  Result Date: 01/28/2023 Lora Havens, MD     01/28/2023  7:28 PM Patient Name: Klare Dokes MRN: [id number] Epilepsy Attending: Lora Havens Referring Physician/Provider: Rosalin Hawking, MD Date: 01/28/2023 Duration: 23.22 mins Patient history: 37yo F with h/o seizures now with L BG ICH. EEG to evaluate for seizure Level of  alertness: comatose AEDs during EEG study: LEV, propofol Technical aspects: This EEG study was done with scalp electrodes positioned according to the 10-20 International system of electrode placement. Electrical activity was reviewed with band pass filter of 1-70Hz$ , sensitivity of 7 uV/mm, display speed of 3m/sec with a 60Hz$  notched filter applied as appropriate. EEG data were recorded continuously and digitally stored.  Video monitoring was available and reviewed as appropriate. Description: EEG showed continuous generalized 3 to 6 Hz theta-delta slowing. Hyperventilation and photic stimulation were not performed.   ABNORMALITY - Continuous slow, generalized IMPRESSION: This study is suggestive of moderate to severe diffuse encephalopathy, nonspecific etiology. No seizures or epileptiform discharges were seen throughout the recording. PLora Havens  ECHOCARDIOGRAM COMPLETE  Result Date: 01/28/2023    ECHOCARDIOGRAM REPORT   Patient Name:   SKAELENE WEILBACHERDate of Exam: 01/28/2023 Medical Rec #:  [id number]     Height:       65.0 in Accession #:    [id number]    Weight:       145.3 lb Date of Birth:  [birth year]     BSA:          1.727 m Patient Age:    36 years       BP:           133/83 mmHg Patient Gender: F              HR:           72 bpm. Exam Location:  Inpatient Procedure: 2D Echo,  Cardiac Doppler, Color Doppler and 3D Echo Indications:    Stroke I63.9  History:        Patient has prior history of Echocardiogram examinations, most                 recent 12/23/2021. Risk Factors:Diabetes, Hypertension,                 Dyslipidemia and Former Smoker.  Sonographer:    NGreer PickerelReferring Phys: [id number] ARORA  Sonographer Comments: Echo performed with patient supine and on artificial respirator. Image acquisition challenging due to respiratory motion. Global longitudinal strain was attempted. IMPRESSIONS  1. There is a moderate pericardial effusion anterior to the right ventricle and a small pericardial effusion posterior to the left ventricle. There is no evidence of cardiac tamponade.  2. Left ventricular ejection fraction, by estimation, is 60 to 65%. The left ventricle has normal function. The left ventricle has no regional wall motion abnormalities. There is severe left ventricular hypertrophy. Left ventricular diastolic parameters  are indeterminate.  3. Right ventricular systolic function is normal. The right ventricular size is mildly enlarged. There is normal pulmonary artery systolic pressure. Dilated pulmonary artery.  4. Left atrial size was moderately dilated.  5. The mitral valve is normal in structure. Trivial mitral valve regurgitation. No evidence of mitral stenosis.  6. The aortic valve is tricuspid. Aortic valve regurgitation is not visualized. No aortic stenosis is present.  7. The inferior vena cava is normal in size with <50% respiratory variability, suggesting right atrial pressure of 8 mmHg. Comparison(s): Changes from prior study are noted. Moderate pericardial effusion is new with no evidence of tamponade. There is severe left ventricular hypertrophy, echogenic. Consider PYP scan to r/o amyloidosis. FINDINGS  Left Ventricle: Left ventricular ejection fraction, by estimation, is 60 to 65%. The left ventricle has normal function. The left ventricle has no regional  wall motion abnormalities. The left  ventricular internal cavity size was normal in size. There is  severe left ventricular hypertrophy. Left ventricular diastolic parameters are indeterminate. Right Ventricle: The right ventricular size is mildly enlarged. No increase in right ventricular wall thickness. Right ventricular systolic function is normal. There is normal pulmonary artery systolic pressure. The tricuspid regurgitant velocity is 1.40  m/s, and with an assumed right atrial pressure of 8 mmHg, the estimated right ventricular systolic pressure is [id number] mmHg. Left Atrium: Left atrial size was moderately dilated. Right Atrium: Right atrial size was normal in size. Pericardium: A moderately sized pericardial effusion is present. The pericardial effusion is anterior to the right ventricle. There is no evidence of cardiac tamponade. Mitral Valve: The mitral valve is normal in structure. Trivial mitral valve regurgitation. No evidence of mitral valve stenosis. Tricuspid Valve: The tricuspid valve is normal in structure. Tricuspid valve regurgitation is trivial. No evidence of tricuspid stenosis. Aortic Valve: The aortic valve is tricuspid. Aortic valve regurgitation is not visualized. No aortic stenosis is present. Pulmonic Valve: The pulmonic valve was normal in structure. Pulmonic valve regurgitation is mild to moderate. No evidence of pulmonic stenosis. Aorta: The aortic root is normal in size and structure. Venous: The inferior vena cava is normal in size with less than 50% respiratory variability, suggesting right atrial pressure of 8 mmHg. IAS/Shunts: No atrial level shunt detected by color flow Doppler.  LEFT VENTRICLE PLAX 2D LVIDd:         4.10 cm   Diastology LVIDs:         2.70 cm   LV e' medial:    4.46 cm/s LV PW:         1.20 cm   LV E/e' medial:  20.2 LV IVS:        1.30 cm   LV e' lateral:   7.40 cm/s LVOT diam:     2.10 cm   LV E/e' lateral: 12.2 LV SV:         97 LV SV Index:   56 LVOT Area:      3.46 cm                           3D Volume EF:                          3D EF:        52 %                          LV EDV:       164 ml                          LV ESV:       78 ml                          LV SV:        86 ml RIGHT VENTRICLE RV S prime:     11.30 cm/s TAPSE (M-mode): 1.6 cm LEFT ATRIUM           Index        RIGHT ATRIUM           Index LA diam:      3.30 cm 1.91 cm/m   RA Area:     16.90 cm LA Vol (A2C): 84.9  ml 49.16 ml/m  RA Volume:   46.20 ml  26.75 ml/m LA Vol (A4C): 61.5 ml 35.61 ml/m  AORTIC VALVE             PULMONIC VALVE LVOT Vmax:   158.00 cm/s PR End Diast Vel: 2.84 msec LVOT Vmean:  95.200 cm/s LVOT VTI:    0.280 m  AORTA Ao Root diam: 3.40 cm Ao Asc diam:  3.60 cm MITRAL VALVE               TRICUSPID VALVE MV Area (PHT): 2.45 cm    TR Peak grad:   7.8 mmHg MV Decel Time: 310 msec    TR Vmax:        140.00 cm/s MR Peak grad: 6.6 mmHg MR Vmax:      128.00 cm/s  SHUNTS MV E velocity: 90.10 cm/s  Systemic VTI:  0.28 m MV A velocity: 65.50 cm/s  Systemic Diam: 2.10 cm MV E/A ratio:  1.38 Vishnu Priya Mallipeddi Electronically signed by Lorelee Cover Mallipeddi Signature Date/Time: 01/28/2023/2:19:31 PM    Final    DG CHEST PORT 1 VIEW  Result Date: 01/28/2023 CLINICAL DATA:  Ventilator dependent EXAM: PORTABLE CHEST 1 VIEW COMPARISON:  01/27/2023 FINDINGS: Endotracheal tube with the tip 4.5 cm above the carina. Left jugular central venous catheter with the tip projecting over the lower SVC. Nasogastric tube projecting over the stomach with the tip excluded from the field of view. Left lower lobe airspace disease likely reflecting atelectasis. No pleural effusion or pneumothorax. Mild stable enlargement of the cardiac silhouette. No acute osseous abnormality. IMPRESSION: 1. Support lines and tubing in satisfactory position. 2. Left lower lobe airspace disease likely reflecting atelectasis. Electronically Signed   By: Kathreen Devoid M.D.   On: 01/28/2023 09:26   CT HEAD WO  CONTRAST  Result Date: 01/28/2023 CLINICAL DATA:  Follow-up examination for hemorrhagic stroke. EXAM: CT HEAD WITHOUT CONTRAST TECHNIQUE: Contiguous axial images were obtained from the base of the skull through the vertex without intravenous contrast. RADIATION DOSE REDUCTION: This exam was performed according to the departmental dose-optimization program which includes automated exposure control, adjustment of the mA and/or kV according to patient size and/or use of iterative reconstruction technique. COMPARISON:  Prior CT from earlier the same day. FINDINGS: Brain: Intraparenchymal hematoma centered at the anterior left basal ganglia again seen, not significantly changed in size and morphology from prior. Intraventricular extension with blood throughout the ventricular system, similar. There has been interval placement of a right frontal approach ventriculostomy with tip in the right lateral ventricle. Overall ventricular size is similar with persistent mild hydrocephalus. Basilar cisterns remain patent. No more than mild localized left-to-right shift at the level of the hematoma. No new intracranial hemorrhage. No new large vessel territory infarct. No mass lesion or extra-axial fluid collection. Vascular: No abnormal hyperdense vessel. Calcified atherosclerosis present at the skull base. Skull: Right frontal approach ventriculostomy. Associated soft tissue swelling and emphysema at the right frontal scalp. Calvarium otherwise intact. Sinuses/Orbits: Visualized globes and orbital soft tissues demonstrate no acute finding. Large right maxillary sinus retention cyst noted. Scattered mucosal thickening noted about the left maxillary sinus. Mastoid air cells remain clear. Other: None. IMPRESSION: 1. Interval placement of a right frontal approach ventriculostomy with tip in the right lateral ventricle. Overall ventricular size is similar with persistent mild hydrocephalus. 2. No significant interval change in size  and morphology of intraparenchymal hematoma centered at the anterior left basal ganglia with intraventricular extension. 3. No other new  acute intracranial abnormality. Electronically Signed   By: Jeannine Boga M.D.   On: 01/28/2023 00:00   DG CHEST PORT 1 VIEW  Result Date: 01/27/2023 CLINICAL DATA:  Central line placement. EXAM: PORTABLE CHEST 1 VIEW COMPARISON:  Chest radiograph dated 01/27/2023. FINDINGS: Endotracheal tube with tip in similar position above the carina. Enteric tube extends below the diaphragm with tip beyond the inferior margin of the image. Interval placement of a left IJ central venous line with tip close to the cavoatrial junction. No pneumothorax. There is cardiomegaly with vascular congestion. No focal consolidation, pleural effusion, or pneumothorax. No acute osseous pathology. IMPRESSION: 1. Interval placement of a left IJ central venous line with tip close to the cavoatrial junction. No pneumothorax. 2. Cardiomegaly with vascular congestion. Electronically Signed   By: Anner Crete M.D.   On: 01/27/2023 22:22   DG Abd Portable 1V  Result Date: 01/27/2023 CLINICAL DATA:  OG tube placement EXAM: PORTABLE ABDOMEN - 1 VIEW COMPARISON:  None Available. FINDINGS: Esophageal tube tip overlies the distal stomach. Upper gas pattern is unremarkable IMPRESSION: Esophageal tube tip overlies the distal stomach. Electronically Signed   By: Donavan Foil M.D.   On: 01/27/2023 19:09   DG Chest Portable 1 View  Result Date: 01/27/2023 CLINICAL DATA:  Altered mental status. EXAM: PORTABLE CHEST 1 VIEW COMPARISON:  Chest radiograph dated 12/16/2022. FINDINGS: Endotracheal tube with tip approximately 3 cm above the carina. Enteric tube extends below the diaphragm with tip beyond the inferior margin of the image. There is mild cardiomegaly with mild central vascular congestion. No focal consolidation, pleural effusion, or pneumothorax. No acute osseous pathology. IMPRESSION: 1.  Endotracheal tube above the carina. 2. Mild cardiomegaly with mild central vascular congestion. Electronically Signed   By: Anner Crete M.D.   On: 01/27/2023 18:20   CT ANGIO HEAD NECK W WO CM  Result Date: 01/27/2023 CLINICAL DATA:  Stroke, hemorrhagic. EXAM: CT ANGIOGRAPHY HEAD AND NECK TECHNIQUE: Multidetector CT imaging of the head and neck was performed using the standard protocol during bolus administration of intravenous contrast. Multiplanar CT image reconstructions and MIPs were obtained to evaluate the vascular anatomy. Carotid stenosis measurements (when applicable) are obtained utilizing NASCET criteria, using the distal internal carotid diameter as the denominator. RADIATION DOSE REDUCTION: This exam was performed according to the departmental dose-optimization program which includes automated exposure control, adjustment of the mA and/or kV according to patient size and/or use of iterative reconstruction technique. CONTRAST:  40m OMNIPAQUE IOHEXOL 350 MG/ML SOLN COMPARISON:  Head CT January 27, 2023. FINDINGS: CTA NECK FINDINGS Aortic arch: Common origin of the innominate and left common carotid artery from the aortic arch. Imaged portion shows no evidence of aneurysm or dissection. No significant stenosis of the major arch vessel origins. Right carotid system: No evidence of dissection, stenosis (50% or greater), or occlusion. Left carotid system: No evidence of dissection, stenosis (50% or greater), or occlusion. Vertebral arteries: No evidence of dissection, stenosis (50% or greater), or occlusion. Skeleton: Apparent fracture of the right humeral shaft is likely artifactual. Other neck: Negative. 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 Review of the MIP images confirms the above findings CTA HEAD FINDINGS Anterior circulation: No significant  stenosis, proximal occlusion, aneurysm, or vascular malformation. Calcified atherosclerotic plaques in the bilateral carotid siphons. Posterior circulation: No significant stenosis, proximal occlusion, aneurysm, or vascular malformation. Venous sinuses:  As permitted by contrast timing, patent. Anatomic variants: None significant. Review of the MIP images confirms the above findings IMPRESSION: 1. No evidence of an aneurysm or vascular malformation to explain known intraventricular hemorrhage. 2. No intracranial large vessel occlusion or significant stenosis. 3. No hemodynamically significant stenosis in the neck. 4. 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 5. Apparent fracture of the right humeral shaft is likely artifactual. Correlate with physical exam and radiographs if clinically appropriate. Electronically Signed   By: Pedro Earls M.D.   On: 01/27/2023 17:05   CT Head Wo Contrast  Result Date: 01/27/2023 CLINICAL DATA:  Mental status change, cause. EXAM: CT HEAD WITHOUT CONTRAST TECHNIQUE: Contiguous axial images were obtained from the base of the skull through the vertex without intravenous contrast. RADIATION DOSE REDUCTION: This exam was performed according to the departmental dose-optimization program which includes automated exposure control, adjustment of the mA and/or kV according to patient size and/or use of iterative reconstruction technique. COMPARISON:  Head CT December 16, 2022. FINDINGS: Brain: A 4.3 x 2.3 x 2.4 cm hematoma within the left lateral ventricle (estimated volume 12 mL). There is red is strip view shin to the right lateral ventricle, third and fourth ventricles. There is mild hydrocephalus with mild prominence of the temporal and occipital horns. No intraparenchymal hemorrhage or ischemic infarct. No midline shift. Vascular: Calcified  plaques in the bilateral carotid siphons and intracranial vertebral arteries. No hyperdense vessel. Skull: Normal. Negative for fracture or focal lesion. Sinuses/Orbits: Large mucous retention cyst in the right maxillary sinus. The orbits are maintained. Other: None. IMPRESSION: A 4.3 x 2.3 x 2.4 cm hematoma within the left lateral ventricle, with intraventricular extension. 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 These results were called by telephone at the time of interpretation on 01/27/2023 at 4:49 pm to provider Sherwood Gambler , who verbally acknowledged these results. Electronically Signed   By: Pedro Earls M.D.   On: 01/27/2023 16:49       HISTORY OF PRESENT ILLNESS Patient with a history of end-stage renal disease on dialysis, diabetes, hypertension, seizures, hyperlipidemia, GERD, hypothyroidism, anxiety and depression presented from dialysis center with altered mental status and syncopal episode.  Intermittent hemodialysis continued throughout admission.  Patient's blood pressure has been stable on p.o. medications, and she is now ready for discharge to rehabilitation.  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 CTA head & neck unremarkable 2/10 - Repeat Head CT- s/p EVD. Overall ventricular size is similar with persistent mild hydrocephalus. Stable hematoma 2/12- CT head- 1. Stable right frontal approach EVD with ventricular decompression since 01/27/2023. Left caudate intra-axial hemorrhage suspected with secondary IVH. Estimated intra-axial blood volume of 5-6 mL, stable to mildly decreased since 01/27/2023. IVH not significantly changed. No new intracranial abnormality. 2D Echo 60-65%, left atrium is moderately  dilated LDL 115 HgbA1c 12.0 UDS positive for THC VTE prophylaxis - heparin subq No antithrombotic prior to admission, now on No antithrombotic.  Therapy recommendations: CLR  disposition: Pending   Acute Hypoxic Respiratory Failure Intubated 2/9 -> 2/12 Goal SpO2 greater than 92%   Hydrocephalus  HTS at 40 ->  switch to D10NS -> off  Na 133->138->140->141->133 EVD placed 2/9 by NSGY 10cmH2O->17cmH2O-> 15 cmH2O Clamped 2/15 Repeat CT Head 2/16   Seizure disorder 09/2021 seizure triggered by hypoglycemia, on Keppra Home meds Keppra 500 mg BID (changed from home dose of daily) daily with 260m additional on dialysis days Not sure compliance Reported frequent seizure at home and with HD EEG 2/10:  Continuous slow, generalized, no seizures    Diabetes type I Uncontrolled Hypoglycemia  D10 d/c'd  SSI TF's restarted after having vomiting overnight  Home meds: Insulin HgbA1c 12.0, goal < 7.0 CBGs   Hypertensive emergency Home meds: Norvasc, Cardizem, furosemide, hydralazine, losartan Cleviprex turned off this am Resume home norvasc 10 Stable now BP goal < 160   Hyperlipidemia Home meds pravastatin 20 LDL 115, goal < 70 AST/ALT 63/45 -> 115/58 Hold off statin due to acute ICH and transaminitis Consider resume statin at discharge   Other Stroke Risk Factors Obesity, Body mass index is 23.04 kg/m., BMI >/= 30 associated with increased stroke risk, recommend weight loss, diet and exercise as appropriate  THC abuse, cessation education will be provided Migraine [facility]  Other Active Problems Hypokalemia, resolved  Replete and repeat BMP, check mag Severe anemia  Hgb 7.5-8.5-6.4 -> 1uPRBC 2/10, ->10.0 -> 8.6 Repeat H/H ESRD with HD MWF HD done 2/9 only received 1 hour her 4 hour session Per her mother she had 5 dialysis sessions last week Nephrology consulted by CCM  Hypothyroidism TSH 201.471 - continue Synthroid Free T4 -  0.77 Anxiety, depression BuSpar, Lexapro Leukocytosis WBC 14.8 -> 11.7 CXR - left lower lobe atelectasis  Afebrile Restlessness, leg pain  Gabapentin [id number] Improved  HOSPITAL COURSE Patient was found to have a left basal ganglia ICH with IVH.  EVD was placed for hydrocephalus on 2/9 and removed on 2/16.  Patient was initially intubated for airway protection and was extubated on 2/12.  DISCHARGE EXAM Blood pressure 121/78, pulse 91, temperature 98.4 F (36.9 C), temperature source Axillary, resp. rate 13, height 5' 5"$  (1.651 m), weight 65.3 kg, SpO2 97 %. General: Drowsy, well-nourished, well-developed patient in no acute distress Respiratory: Regular, unlabored respirations on room air  NEURO:  Mental Status: AA&Ox3  Speech/Language: speech is without dysarthria or aphasia.  Naming is slightly impaired with patient able to name 2 out of 3 objects  Cranial Nerves:  II: PERRL. Visual fields full.  III, IV, VI: EOMI. Eyelids elevate symmetrically.  V: Sensation is intact to light touch and symmetrical to face.  VII: Smile is symmetrical.  VIII: hearing intact to voice. IX, X: Phonation is normal.  XII: tongue is midline without fasciculations. Motor: 5/5 strength to all muscle groups tested.  Tone: is normal and bulk is normal Sensation- Intact to light touch bilaterally.  Gait- deferred   Discharge Diet      Diet   DIET DYS 3 Room service appropriate? Yes; Fluid consistency: Nectar Thick   liquids  DISCHARGE PLAN Disposition:  [place] and [place] No antithrombotic secondary to [place] stroke risk factor control by Primary Care Physician at time of discharge from inpatient rehabilitation. Follow-up PCP JoLadell PierMD in 2 weeks following discharge from rehab. [facility] in 8 weeks following discharge from rehab, office to schedule an appointment.   25  minutes were spent preparing discharge.  [place] MSN,  AGACNP-BC [facility] See Amion for schedule and pager information 02/04/2023 11:20 AM

## 2023-02-05 DIAGNOSIS — I619 Nontraumatic intracerebral hemorrhage, unspecified: Secondary | ICD-10-CM | POA: Diagnosis not present

## 2023-02-05 LAB — GLUCOSE, CAPILLARY
Glucose-Capillary: 112 mg/dL — ABNORMAL HIGH (ref 70–99)
Glucose-Capillary: 214 mg/dL — ABNORMAL HIGH (ref 70–99)
Glucose-Capillary: 216 mg/dL — ABNORMAL HIGH (ref 70–99)
Glucose-Capillary: 328 mg/dL — ABNORMAL HIGH (ref 70–99)
Glucose-Capillary: 328 mg/dL — ABNORMAL HIGH (ref 70–99)
Glucose-Capillary: 332 mg/dL — ABNORMAL HIGH (ref 70–99)
Glucose-Capillary: 371 mg/dL — ABNORMAL HIGH (ref 70–99)
Glucose-Capillary: 409 mg/dL — ABNORMAL HIGH (ref 70–99)
Glucose-Capillary: 41 mg/dL — CL (ref 70–99)

## 2023-02-05 LAB — HEPATITIS B SURFACE ANTIGEN: Hepatitis B Surface Ag: NONREACTIVE

## 2023-02-05 MED ORDER — INSULIN GLARGINE-YFGN 100 UNIT/ML ~~LOC~~ SOLN
12.0000 [IU] | Freq: Every day | SUBCUTANEOUS | Status: DC
  Administered 2023-02-06 – 2023-02-11 (×6): 12 [IU] via SUBCUTANEOUS
  Filled 2023-02-05 (×7): qty 0.12

## 2023-02-05 MED ORDER — INSULIN GLARGINE-YFGN 100 UNIT/ML ~~LOC~~ SOLN
13.0000 [IU] | Freq: Every day | SUBCUTANEOUS | Status: DC
  Administered 2023-02-05: 13 [IU] via SUBCUTANEOUS
  Filled 2023-02-05 (×2): qty 0.13

## 2023-02-05 MED ORDER — DEXTROSE 50 % IV SOLN
INTRAVENOUS | Status: AC
  Administered 2023-02-06: 25 mL
  Filled 2023-02-05: qty 50

## 2023-02-05 MED ORDER — INSULIN ASPART 100 UNIT/ML IJ SOLN
0.0000 [IU] | Freq: Three times a day (TID) | INTRAMUSCULAR | Status: DC
  Administered 2023-02-05: 2 [IU] via SUBCUTANEOUS
  Administered 2023-02-05: 6 [IU] via SUBCUTANEOUS
  Administered 2023-02-07: 2 [IU] via SUBCUTANEOUS
  Administered 2023-02-07 – 2023-02-08 (×2): 1 [IU] via SUBCUTANEOUS
  Administered 2023-02-08: 3 [IU] via SUBCUTANEOUS
  Administered 2023-02-09 – 2023-02-10 (×3): 2 [IU] via SUBCUTANEOUS
  Administered 2023-02-10: 4 [IU] via SUBCUTANEOUS
  Administered 2023-02-11 – 2023-02-12 (×2): 1 [IU] via SUBCUTANEOUS
  Administered 2023-02-12: 4 [IU] via SUBCUTANEOUS
  Administered 2023-02-12 (×2): 6 [IU] via SUBCUTANEOUS
  Administered 2023-02-13: 2 [IU] via SUBCUTANEOUS
  Administered 2023-02-13: 3 [IU] via SUBCUTANEOUS
  Administered 2023-02-14 (×2): 5 [IU] via SUBCUTANEOUS
  Administered 2023-02-16: 4 [IU] via SUBCUTANEOUS

## 2023-02-05 MED ORDER — INSULIN ASPART 100 UNIT/ML IJ SOLN
2.0000 [IU] | Freq: Three times a day (TID) | INTRAMUSCULAR | Status: DC
  Administered 2023-02-05: 2 [IU] via SUBCUTANEOUS

## 2023-02-05 MED ORDER — INSULIN ASPART 100 UNIT/ML IJ SOLN
1.0000 [IU] | Freq: Once | INTRAMUSCULAR | Status: AC
  Administered 2023-02-05: 1 [IU] via SUBCUTANEOUS

## 2023-02-05 MED ORDER — INSULIN ASPART 100 UNIT/ML IJ SOLN: 0.0000 [IU] | Freq: Three times a day (TID) | INTRAMUSCULAR | Status: DC

## 2023-02-05 NOTE — Progress Notes (Signed)
  [facility] Progress Note   Subjective:  Seen in room - moved to CIR yesterday. Feels well. No CP/dyspnea. Looks like had some hypoglycemic issues overnight - better now.   Objective Vitals:   02/04/23 1550 02/04/23 1605 02/04/23 1952 02/05/23 0633  BP: (!) 144/83  (!) 157/85   Pulse: 85  92   Resp: 16  16   Temp: 98.1 F (36.7 C)  98.1 F (36.7 C)   TempSrc: Oral     SpO2: 97%  99%   Weight:  63.7 kg  65 kg  Height: 5' 4.96" (1.65 m)      Physical Exam General: Well appearing woman, NAD. Room air. Heart: RRR; no murmur Lungs: CTA anteriorly Abdomen: soft Extremities: no LE edema Dialysis Access: LUE AVF + bruit  Additional Objective Labs: Basic Metabolic Panel: Recent Labs  Lab 02/02/23 0446 02/02/23 1803 02/03/23 0426 02/04/23 0258  NA 135  --  131* 132*  K 4.0  --  4.7 5.0  CL 96*  --  93* 93*  CO2 23  --  21* 25  GLUCOSE 86  --  376* 153*  BUN 32*  --  45* 25*  CREATININE 5.95*  --  7.30* 4.58*  CALCIUM 9.5  --  9.9 9.8  PHOS 5.5*  5.5* 6.3* 6.5* 5.3*   Liver Function Tests: Recent Labs  Lab 02/02/23 0446 02/03/23 0426 02/04/23 0258  ALBUMIN 2.9* 2.8* 2.6*   CBC: Recent Labs  Lab 01/30/23 0357 01/31/23 0548 02/01/23 0601 02/04/23 0258  WBC 14.8* 14.2* 11.7* 8.1  HGB 8.1* 8.2* 8.6* 7.9*  HCT 25.5* 26.7* 27.1* 25.3*  MCV 91.4 94.0 91.9 91.0  PLT 103* 128* 167 140*   Medications:   amLODipine  10 mg Oral Daily   busPIRone  15 mg Oral QHS   Chlorhexidine Gluconate Cloth  6 each Topical Daily   [START ON 02/10/2023] darbepoetin (ARANESP) injection - DIALYSIS  100 mcg Subcutaneous Q Fri-1800   diltiazem  60 mg Oral Q6H   escitalopram  20 mg Oral Daily   gabapentin  100 mg Oral TID   heparin injection (subcutaneous)  5,000 Units Subcutaneous Q8H   hydrALAZINE  100 mg Oral Q8H   insulin aspart  0-6 Units Subcutaneous TID WC   insulin aspart  0-6 Units Subcutaneous TID WC   insulin glargine-yfgn  13 Units Subcutaneous Daily    lanthanum  1,000 mg Oral TID with meals   levETIRAcetam  500 mg Oral BID   levothyroxine  175 mcg Oral q morning   multivitamin  1 tablet Oral QHS   pantoprazole (PROTONIX) IV  40 mg Intravenous QHS   senna-docusate  1 tablet Oral BID    Dialysis Orders: MWF [facility] [address] 3.5hr, EDW 62kg, 2K/2.5Ca bath, LUE AVF, Prev was getting Hep 2600 + 500 u/hr - now held   Assessment/Plan: 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 Slowly improving mentation/recovery. ESRD: Continue HD on MWF schedule - next 2/19, no added heparin. 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 Phos improving, continue lanthanum as binder. Acute hypoxic respiratory failure: Secondary to altered mental status in the setting of intracranial bleed.  Resolved/extubated and remains on room air without supplemental oxygen. Hypertension: BP variable, now on amlodipine + diltiazem. Dispo: Transferred to CIR on 2/17.  Veneta Penton, PA-C 02/05/2023, 10:15 AM  [employer]

## 2023-02-05 NOTE — Progress Notes (Signed)
PROGRESS NOTE   Subjective/Complaints: Sleepy this morning- very sensitive sliding scale started  Tolerated therapy well today Appreciate nephrology following  ROS: denies shortness of breath  Objective:   No results found. Recent Labs    02/04/23 0258  WBC 8.1  HGB 7.9*  HCT 25.3*  PLT 140*   Recent Labs    02/03/23 0426 02/04/23 0258  NA 131* 132*  K 4.7 5.0  CL 93* 93*  CO2 21* 25  GLUCOSE 376* 153*  BUN 45* 25*  CREATININE 7.30* 4.58*  CALCIUM 9.9 9.8   No intake or output data in the 24 hours ending 02/05/23 1552      Physical Exam: Vital Signs Blood pressure (!) 157/85, pulse 92, temperature 98.1 F (36.7 C), resp. rate 16, height 5' 4.96" (1.65 m), weight 65 kg, SpO2 99 %. Gen: no distress, normal appearing, fatigued HEENT: oral mucosa pink and moist, NCAT, no visual deficits Cardio: Reg rate Chest: normal effort, normal rate of breathing Abd: soft, non-distended Ext: no edema Psych: pleasant, normal affect Skin:LUE AVF Neuro: 4/5 throughout, sensation is intact. Can follow commands. Impaired safety awareness. Can recall 2/3 objects. Hypophonic. Sensation intact   Assessment/Plan: 1. Functional deficits which require 3+ hours per day of interdisciplinary therapy in a comprehensive inpatient rehab setting. Physiatrist is providing close team supervision and 24 hour management of active medical problems listed below. Physiatrist and rehab team continue to assess barriers to discharge/monitor patient progress toward functional and medical goals  Care Tool:  Bathing    Body parts bathed by patient: Right arm, Left arm, Chest, Abdomen, Front perineal area, Buttocks, Right upper leg, Left upper leg, Right lower leg, Left lower leg, Face         Bathing assist Assist Level: Set up assist     Upper Body Dressing/Undressing Upper body dressing   What is the patient wearing?: Dress, Bra     Upper body assist Assist Level: Minimal Assistance - Patient > 75% (Due to tight fit.)    Lower Body Dressing/Undressing Lower body dressing      What is the patient wearing?: Underwear/pull up     Lower body assist Assist for lower body dressing: Set up assist     Toileting Toileting Toileting Activity did not occur (Clothing management and hygiene only): N/A (no void or bm)  Toileting assist       Transfers Chair/bed transfer  Transfers assist     Chair/bed transfer assist level: Minimal Assistance - Patient > 75%     Locomotion Ambulation   Ambulation assist      Assist level: Minimal Assistance - Patient > 75% Assistive device: Other (comment) (at gait belt and L shoulder) Max distance: 161   Walk 10 feet activity   Assist     Assist level: Minimal Assistance - Patient > 75% Assistive device: Other (comment) (at gait belt and L shoulder)   Walk 50 feet activity   Assist    Assist level: Minimal Assistance - Patient > 75% Assistive device: Other (comment) (at gait belt and L shoulder)    Walk 150 feet activity   Assist    Assist level: Minimal Assistance -  Patient > 75% Assistive device: Other (comment) (at gait belt and L shoulder)    Walk 10 feet on uneven surface  activity   Assist     Assist level: Minimal Assistance - Patient > 75% (ramp) Assistive device: Other (comment) (at gait belt and L shoulder and does reach for external support if available.)   Wheelchair     Assist Is the patient using a wheelchair?: Yes Type of Wheelchair: Manual    Wheelchair assist level: Supervision/Verbal cueing Max wheelchair distance: 100    Wheelchair 50 feet with 2 turns activity    Assist            Wheelchair 150 feet activity     Assist      Assist Level: Supervision/Verbal cueing, Minimal Assistance - Patient > 75%   Blood pressure (!) 157/85, pulse 92, temperature 98.1 F (36.7 C), resp. rate 16, height 5'  4.96" (1.65 m), weight 65 kg, SpO2 99 %.  Medical Problem List and Plan: 1. Functional deficits secondary to [place]             -patient may shower             -ELOS/Goals: 7-10 days S             Admit to CIR   2.  Antithrombotics: -DVT/anticoagulation:  Pharmaceutical: Heparin             -antiplatelet therapy: none   3. Pain Management: Tylenol as needed   4. Mood/Behavior/Sleep: LCSW to evaluate and provide emotional support             -depression/anxiety: continue Buspar and Lexapro             -antipsychotic agents: n/a   5. Neuropsych/cognition: This patient not capable of making decisions on her own behalf.   6. Skin/Wound Care: Routine skin care checks   7. Fluids/Electrolytes/Nutrition: strict Is and Os; chemistries as per nephrology             -TF d/c ed             -continue Fosrenol, MVI, Aranesp   8: Hypertension: monitor TID and prn (home Lasix, losartan held>>other home meds restarted             -continue amlodipine 10 mg daily             -diltiazem 60 mg TID             -hydralazine 100 mg TID  -getting hydralazine 20 mg IV q 4 prn (~2-4 doses daily) -getting labetalol 20 mg IV q 2 hours prn (~4 doses daily)   9: Hyperlipidemia: home statin held   10: Hydrocephalus: resolved; EVD placed 2/09, removed 2/16   11: ESRD: HD on M/W/F via LUE AVF   12: DM-1; uncontolled: continue CBGs q 4 hours. Sensitive sliding scale changed to very sensitive given CBG of 32 on admission -decrease Semglee to 12 units daily -add 1U TID novolog with meals as per diabetic coordinator recommendations.    13: Seizure disorder: home dose Keppra resumed             continue Keppra 500 mg BID             -gabapentin 100 mg TID   14: Hypothyroidism: continue Synthroid   15: Anemia, chronic disease: s/p 1 unit PCs, follow-up CBC    LOS: 1 days A FACE TO FACE EVALUATION WAS PERFORMED  Clide Deutscher Jemaine Prokop 02/05/2023,  3:52 PM
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
## 2023-02-05 NOTE — Progress Notes (Signed)
CBGs checked q4h-noted to be trending up with no sliding scale coverage at this time. [address] PA notified. New order received. Secure chat sent to MD Raulkar regarding follow up.

## 2023-02-05 NOTE — Inpatient Diabetes Management (Signed)
Inpatient Diabetes Program Recommendations  AACE/ADA: New Consensus Statement on Inpatient Glycemic Control (2015)  Target Ranges:  Prepandial:   less than 140 mg/dL      Peak postprandial:   less than 180 mg/dL (1-2 hours)      Critically ill patients:  140 - 180 mg/dL   Lab Results  Component Value Date   GLUCAP 371 (H) 02/05/2023   HGBA1C 12.0 (H) 01/28/2023    Latest Reference Range & Units 02/04/23 07:16 02/04/23 11:27 02/04/23 16:37 02/04/23 16:44 02/04/23 17:01  Glucose-Capillary 70 - 99 mg/dL 222 (H) 133 (H) 32 (LL) 30 (LL) 139 (H)  (LL): Data is critically low (H): Data is abnormally high  Latest Reference Range & Units 02/04/23 17:01 02/04/23 21:19 02/05/23 00:03 02/05/23 04:30 02/05/23 06:24  Glucose-Capillary 70 - 99 mg/dL 139 (H) 182 (H) 214 (H) 332 (H) 328 (H)  (H): Data is abnormally high    Diabetes history: Type 1 DM Outpatient Diabetes medications: Novolog 2-8 units tid with meals,Tresiba 12 units daily Current orders for Inpatient glycemic control: Novolog 0-6 units q 4 hours, Semglee 13 units daily  Inpatient Diabetes Program Recommendations:   Since patient is eating, Tube feed now off and on dysphasia diet Please consider: -Decrease Semglee back to 12 units qd -Add Novolog 1-2 units tid meal coverage tid ac meals if eats 50% meal/supplements  Thank you, Bethena Roys E. Deshanna Kama, RN, MSN, CDE  Diabetes Coordinator Inpatient Glycemic Control Team Team Pager [phone] (8am-5pm) 02/05/2023 9:26 AM

## 2023-02-05 NOTE — Plan of Care (Signed)
  Problem: RH Balance Goal: LTG Patient will maintain dynamic standing with ADLs (OT) Description: LTG:  Patient will maintain dynamic standing balance with assist during activities of daily living (OT)  Flowsheets (Taken 02/05/2023 1228) LTG: Pt will maintain dynamic standing balance during ADLs with: Independent with assistive device   Problem: Sit to Stand Goal: LTG:  Patient will perform sit to stand in prep for activites of daily living with assistance level (OT) Description: LTG:  Patient will perform sit to stand in prep for activites of daily living with assistance level (OT) Flowsheets (Taken 02/05/2023 1228) LTG: PT will perform sit to stand in prep for activites of daily living with assistance level: Independent with assistive device   Problem: RH Grooming Goal: LTG Patient will perform grooming w/assist,cues/equip (OT) Description: LTG: Patient will perform grooming with assist, with/without cues using equipment (OT) Flowsheets (Taken 02/05/2023 1228) LTG: Pt will perform grooming with assistance level of: Independent with assistive device    Problem: RH Bathing Goal: LTG Patient will bathe all body parts with assist levels (OT) Description: LTG: Patient will bathe all body parts with assist levels (OT) Flowsheets (Taken 02/05/2023 1228) LTG: Pt will perform bathing with assistance level/cueing: Independent with assistive device    Problem: RH Dressing Goal: LTG Patient will perform lower body dressing w/assist (OT) Description: LTG: Patient will perform lower body dressing with assist, with/without cues in positioning using equipment (OT) Flowsheets (Taken 02/05/2023 1228) LTG: Pt will perform lower body dressing with assistance level of: Independent with assistive device   Problem: RH Toileting Goal: LTG Patient will perform toileting task (3/3 steps) with assistance level (OT) Description: LTG: Patient will perform toileting task (3/3 steps) with assistance level (OT)   Flowsheets (Taken 02/05/2023 1228) LTG: Pt will perform toileting task (3/3 steps) with assistance level: Independent with assistive device   Problem: RH Simple Meal Prep Goal: LTG Patient will perform simple meal prep w/assist (OT) Description: LTG: Patient will perform simple meal prep with assistance, with/without cues (OT). Flowsheets (Taken 02/05/2023 1228) LTG: Pt will perform simple meal prep with assistance level of: Independent with assistive device   Problem: RH Light Housekeeping Goal: LTG Patient will perform light housekeeping w/assist (OT) Description: LTG: Patient will perform light housekeeping with assistance, with/without cues (OT). Flowsheets (Taken 02/05/2023 1228) LTG: Pt will perform light housekeeping with assistance level of: Independent with assistive device   Problem: RH Toilet Transfers Goal: LTG Patient will perform toilet transfers w/assist (OT) Description: LTG: Patient will perform toilet transfers with assist, with/without cues using equipment (OT) Flowsheets (Taken 02/05/2023 1228) LTG: Pt will perform toilet transfers with assistance level of: Independent with assistive device   Problem: RH Tub/Shower Transfers Goal: LTG Patient will perform tub/shower transfers w/assist (OT) Description: LTG: Patient will perform tub/shower transfers with assist, with/without cues using equipment (OT) Flowsheets (Taken 02/05/2023 1228) LTG: Pt will perform tub/shower stall transfers with assistance level of: Independent with assistive device   Problem: RH Memory Goal: LTG Patient will demonstrate ability for day to day recall/carry over during activities of daily living with assistance level (OT) Description: LTG:  Patient will demonstrate ability for day to day recall/carry over during activities of daily living with assistance level (OT). Flowsheets (Taken 02/05/2023 1228) LTG:  Patient will demonstrate ability for day to day recall/carry over during activities of daily  living with assistance level (OT): Modified Independent
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
## 2023-02-05 NOTE — Progress Notes (Signed)
MD notified of blood sugar being 409. Administered 6 Units of insulin per order.
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
## 2023-02-06 DIAGNOSIS — I610 Nontraumatic intracerebral hemorrhage in hemisphere, subcortical: Secondary | ICD-10-CM | POA: Diagnosis not present

## 2023-02-06 LAB — RENAL FUNCTION PANEL
Albumin: 3 g/dL — ABNORMAL LOW (ref 3.5–5.0)
Anion gap: 17 — ABNORMAL HIGH (ref 5–15)
BUN: 40 mg/dL — ABNORMAL HIGH (ref 6–20)
CO2: 24 mmol/L (ref 22–32)
Calcium: 10.1 mg/dL (ref 8.9–10.3)
Chloride: 91 mmol/L — ABNORMAL LOW (ref 98–111)
Creatinine, Ser: 8.04 mg/dL — ABNORMAL HIGH (ref 0.44–1.00)
GFR, Estimated: 6 mL/min — ABNORMAL LOW (ref >60)
Glucose, Bld: 111 mg/dL — ABNORMAL HIGH (ref 70–99)
Phosphorus: 6.3 mg/dL — ABNORMAL HIGH (ref 2.5–4.6)
Potassium: 4.7 mmol/L (ref 3.5–5.1)
Sodium: 132 mmol/L — ABNORMAL LOW (ref 135–145)

## 2023-02-06 LAB — CBC
HCT: 26.9 % — ABNORMAL LOW (ref 36.0–46.0)
Hemoglobin: 8.9 g/dL — ABNORMAL LOW (ref 12.0–15.0)
MCH: 29.7 pg (ref 26.0–34.0)
MCHC: 33.1 g/dL (ref 30.0–36.0)
MCV: 89.7 fL (ref 80.0–100.0)
Platelets: 219 10*3/uL (ref 150–400)
RBC: 3 MIL/uL — ABNORMAL LOW (ref 3.87–5.11)
RDW: 14.2 % (ref 11.5–15.5)
WBC: 12.4 10*3/uL — ABNORMAL HIGH (ref 4.0–10.5)
nRBC: 0.4 % — ABNORMAL HIGH (ref 0.0–0.2)

## 2023-02-06 LAB — GLUCOSE, CAPILLARY
Glucose-Capillary: 95 mg/dL (ref 70–99)
Glucose-Capillary: 119 mg/dL — ABNORMAL HIGH (ref 70–99)
Glucose-Capillary: 161 mg/dL — ABNORMAL HIGH (ref 70–99)
Glucose-Capillary: 147 mg/dL — ABNORMAL HIGH (ref 70–99)
Glucose-Capillary: 55 mg/dL — ABNORMAL LOW (ref 70–99)
Glucose-Capillary: 63 mg/dL — ABNORMAL LOW (ref 70–99)
Glucose-Capillary: 92 mg/dL (ref 70–99)

## 2023-02-06 MED ORDER — INSULIN ASPART 100 UNIT/ML IJ SOLN
1.0000 [IU] | Freq: Three times a day (TID) | INTRAMUSCULAR | Status: DC
  Administered 2023-02-07 – 2023-02-13 (×4): 1 [IU] via SUBCUTANEOUS

## 2023-02-06 NOTE — Progress Notes (Incomplete)
Skilled ST treatment focused on cognitive goals. Pt was sleeping soundly on arrival, despite having just completed OT 15 minutes prior. Pt required extensive time to rouse and remained lethargic throughout encounter. SLP facilitated orientation with max A multimodal cues. Pt stating it's November and she's in the hospital for feeling "a little out of it." Pt had minimal awareness of reasoning for hospitalization. Pt required max A verbal redirection cues to maintain focus and arousal. Pt exhibited some language of confusion (discussing things pt felt were actively being played on TV, however TV wasn't on). Suspect lethargy contributed. Session was prematurely concluded 15 minutes early d/t significant fatigue impacting level of functional participation. Patient was left in bed with alarm activated and immediate needs within reach at end of session. Continue per current plan of care.

## 2023-02-06 NOTE — Progress Notes (Signed)
Inpatient [facility] Individual Statement of Services  Patient Name:  Angela [name]  Date:  02/06/2023  Welcome to the [facility].  Our goal is to provide you with an individualized program based on your diagnosis and situation, designed to meet your specific needs.  With this comprehensive rehabilitation program, you will be expected to participate in at least 3 hours of rehabilitation therapies Monday-Friday, with modified therapy programming on the weekends.  Your rehabilitation program will include the following services:  Physical Therapy (PT), Occupational Therapy (OT), Speech Therapy (ST), 24 hour per day rehabilitation nursing, Therapeutic Recreaction (TR), Neuropsychology, Care Coordinator, Rehabilitation Medicine, Nutrition Services, Pharmacy Services, and Other  Weekly team conferences will be held on Wednesdays to discuss your progress.  Your Inpatient Rehabilitation Care Coordinator will talk with you frequently to get your input and to update you on team discussions.  Team conferences with you and your family in attendance may also be held.  Expected length of stay: 12-14 Days  Overall anticipated outcome: supervision to min assist goals   Depending on your progress and recovery, your program may change. Your Inpatient Rehabilitation Care Coordinator will coordinate services and will keep you informed of any changes. Your Inpatient Rehabilitation Care Coordinator's name and contact numbers are listed  below.  The following services may also be recommended but are not provided by the [facility]:   [place] will be made to provide these services after discharge if needed.  Arrangements include referral to agencies that provide these services.  Your insurance has been verified to be:   Medicaid of [place] Your primary doctor is:  Karle Plumber,  MD  Pertinent information will be shared with your doctor and your insurance company.  Inpatient Rehabilitation Care Coordinator:  Erlene Quan, [place] or [phone]  Information discussed with and copy given to patient by: Dyanne Iha, 02/06/2023, 11:34 AM

## 2023-02-06 NOTE — Progress Notes (Signed)
Post Hd Tx    02/06/23 2009  Vitals  Temp 98.1 F (36.7 C)  Resp 14  BP (!) 180/74  O2 Device Room Air  Weight 65.4 kg  Type of Weight Post-Dialysis  Oxygen Therapy  Patient Activity (if Appropriate) In bed  Pulse Oximetry Type Continuous  Post Treatment  Dialyzer Clearance Lightly streaked  Duration of HD Treatment -hour(s) 3.5 hour(s)  Liters Processed 74.7  Fluid Removed (mL) 2400 mL  Tolerated HD Treatment Yes

## 2023-02-06 NOTE — Inpatient Diabetes Management (Signed)
Inpatient Diabetes Program Recommendations  AACE/ADA: New Consensus Statement on Inpatient Glycemic Control (2015)  Target Ranges:  Prepandial:   less than 140 mg/dL      Peak postprandial:   less than 180 mg/dL (1-2 hours)      Critically ill patients:  140 - 180 mg/dL   Lab Results  Component Value Date   GLUCAP 161 (H) 02/06/2023   HGBA1C 12.0 (H) 01/28/2023    Review of Glycemic Control  Latest Reference Range & Units 02/05/23 16:37 02/05/23 20:02 02/05/23 23:57 02/06/23 01:04 02/06/23 04:09 02/06/23 08:49  Glucose-Capillary 70 - 99 mg/dL 216 (H) Novolog 4 units  112 (H) 41 (LL) 95 119 (H) 161 (H)  (LL): Data is critically low (H): Data is abnormally high  Diabetes history: Type 1 DM Outpatient Diabetes medications: Novolog 2-8 units tid with meals,Tresiba 12 units daily Current orders for Inpatient glycemic control: Novolog 0-6 units TID,  Novolog 1 units TID (decreased today from 2 units), Semglee 12 units daily  Referral received for "CBG 32 yesterday, 300's today, type 1 diabetes".     Was 41 mg/dl last night at 23:57 after receiving  Novolog 2 units of meal coverage and 2 units of correction.  MD decreased meal coverage to Novolog 1 units TID-agree with decrease in meal coverage.  Will continue to follow while inpatient.  Thank you, Reche Dixon, MSN, [place] Diabetes Coordinator Inpatient Diabetes Program [phone] (team pager from 8a-5p)

## 2023-02-06 NOTE — Progress Notes (Signed)
PROGRESS NOTE   Subjective/Complaints:  No issues overnite except low CBG, awake but drowsy this am  Ate ~10% breakfast today   ROS: denies shortness of breath  Objective:   No results found. Recent Labs    02/04/23 0258 02/06/23 0610  WBC 8.1 12.4*  HGB 7.9* 8.9*  HCT 25.3* 26.9*  PLT 140* 219    Recent Labs    02/04/23 0258 02/06/23 0610  NA 132* 132*  K 5.0 4.7  CL 93* 91*  CO2 25 24  GLUCOSE 153* 111*  BUN 25* 40*  CREATININE 4.58* 8.04*  CALCIUM 9.8 10.1     Intake/Output Summary (Last 24 hours) at 02/06/2023 0756 Last data filed at 02/06/2023 0010 Gross per 24 hour  Intake 1058 ml  Output --  Net 1058 ml        Physical Exam: Vital Signs Blood pressure (!) 166/74, pulse 88, temperature 98.2 F (36.8 C), resp. rate 16, height 5' 4.96" (1.65 m), weight 64.7 kg, SpO2 98 %.   General: No acute distress Mood and affect are appropriate Heart: Regular rate and rhythm no rubs murmurs or extra sounds Lungs: Clear to auscultation, breathing unlabored, no rales or wheezes Abdomen: Positive bowel sounds, soft nontender to palpation, nondistended Extremities: No clubbing, cyanosis, or edema   Skin:LUE AVF Neuro: 4/5 throughout, sensation is intact. Can follow commands. Impaired safety awareness. Can recall 2/3 objects. Hypophonic. Sensation intact   Assessment/Plan: 1. Functional deficits which require 3+ hours per day of interdisciplinary therapy in a comprehensive inpatient rehab setting. Physiatrist is providing close team supervision and 24 hour management of active medical problems listed below. Physiatrist and rehab team continue to assess barriers to discharge/monitor patient progress toward functional and medical goals  Care Tool:  Bathing    Body parts bathed by patient: Right arm, Left arm, Chest, Abdomen, Front perineal area, Buttocks, Right upper leg, Left upper leg, Right lower leg,  Left lower leg, Face         Bathing assist Assist Level: Set up assist     Upper Body Dressing/Undressing Upper body dressing   What is the patient wearing?: Dress, Bra    Upper body assist Assist Level: Minimal Assistance - Patient > 75% (Due to tight fit.)    Lower Body Dressing/Undressing Lower body dressing      What is the patient wearing?: Underwear/pull up     Lower body assist Assist for lower body dressing: Set up assist     Toileting Toileting Toileting Activity did not occur (Clothing management and hygiene only): N/A (no void or bm)  Toileting assist       Transfers Chair/bed transfer  Transfers assist     Chair/bed transfer assist level: Minimal Assistance - Patient > 75%     Locomotion Ambulation   Ambulation assist      Assist level: Minimal Assistance - Patient > 75% Assistive device: Other (comment) (at gait belt and L shoulder) Max distance: 161   Walk 10 feet activity   Assist     Assist level: Minimal Assistance - Patient > 75% Assistive device: Other (comment) (at gait belt and L shoulder)   Walk 50 feet  activity   Assist    Assist level: Minimal Assistance - Patient > 75% Assistive device: Other (comment) (at gait belt and L shoulder)    Walk 150 feet activity   Assist    Assist level: Minimal Assistance - Patient > 75% Assistive device: Other (comment) (at gait belt and L shoulder)    Walk 10 feet on uneven surface  activity   Assist     Assist level: Minimal Assistance - Patient > 75% (ramp) Assistive device: Other (comment) (at gait belt and L shoulder and does reach for external support if available.)   Wheelchair     Assist Is the patient using a wheelchair?: Yes Type of Wheelchair: Manual    Wheelchair assist level: Supervision/Verbal cueing Max wheelchair distance: 100    Wheelchair 50 feet with 2 turns activity    Assist            Wheelchair 150 feet activity      Assist      Assist Level: Supervision/Verbal cueing, Minimal Assistance - Patient > 75%   Blood pressure (!) 166/74, pulse 88, temperature 98.2 F (36.8 C), resp. rate 16, height 5' 4.96" (1.65 m), weight 64.7 kg, SpO2 98 %.  Medical Problem List and Plan: 1. Functional deficits secondary to [place] left caudate with resolving IVH - uncontrolled              -patient may shower             -ELOS/Goals: 7-10 days S             Admit to CIR   2.  Antithrombotics: -DVT/anticoagulation:  Pharmaceutical: Heparin             -antiplatelet therapy: none   3. Pain Management: Tylenol as needed   4. Mood/Behavior/Sleep: LCSW to evaluate and provide emotional support             -depression/anxiety: continue Buspar and Lexapro             -antipsychotic agents: n/a   5. Neuropsych/cognition: This patient not capable of making decisions on her own behalf.   6. Skin/Wound Care: Routine skin care checks   7. Fluids/Electrolytes/Nutrition: strict Is and Os; chemistries as per nephrology             -TF d/c ed             -continue Fosrenol, MVI, Aranesp   8: Hypertension: monitor TID and prn (home Lasix, losartan held>>other home meds restarted             -continue amlodipine 10 mg daily             -diltiazem 60 mg TID             -hydralazine 100 mg TID  -getting hydralazine 20 mg IV q 4 prn (~2-4 doses daily) -getting labetalol 20 mg IV q 2 hours prn (~4 doses daily)   Vitals:   02/05/23 1940 02/06/23 0321  BP: (!) 150/64 (!) 166/74  Pulse: 93 88  Resp: 16 16  Temp: 98.6 F (37 C) 98.2 F (36.8 C)  SpO2: 98% 98%    9: Hyperlipidemia: home statin held   10: Hydrocephalus: resolved; EVD placed 2/09, removed 2/16   11: ESRD: HD on M/W/F via LUE AVF   12: DM-1; uncontolled: continue CBGs q 4 hours. Sensitive sliding scale changed to very sensitive given CBG of 32 on admission -decrease Semglee to 12 units daily -reduce  novalog to  1U TID novolog with meals as per  diabetic coordinator recommendations. Hold if po intake <50%  CBG (last 3)  Recent Labs    02/05/23 2357 02/06/23 0104 02/06/23 0409  GLUCAP 41* 95 119*   Used tresiba and novalog    13: Seizure disorder: home dose Keppra resumed             continue Keppra 500 mg BID             -gabapentin 100 mg TID   14: Hypothyroidism: continue Synthroid   15: Anemia, chronic disease: s/p 1 unit PCs, follow-up CBC    LOS: 2 days A FACE TO FACE EVALUATION WAS PERFORMED  Angela [name] 02/06/2023, 7:56 AM

## 2023-02-06 NOTE — Progress Notes (Signed)
[name] Progress Note   Subjective:    Seen and examined patient at bedside on CIR unit. She reports feeling SOB overnight. She remains on RA and scheduled for HD today. PT at bedside about to start therapy.  Objective Vitals:   02/04/23 1952 02/05/23 0633 02/05/23 1940 02/06/23 0321  BP: (!) 157/85  (!) 150/64 (!) 166/74  Pulse: 92  93 88  Resp: 16  16 16  $ Temp: 98.1 F (36.7 C)  98.6 F (37 C) 98.2 F (36.8 C)  TempSrc:   Oral   SpO2: 99%  98% 98%  Weight:  65 kg  64.7 kg  Height:       Physical Exam General: Well appearing woman, NAD. Room air. Heart: RRR; no murmur Lungs: CTA anteriorly Abdomen: soft Extremities: no LE edema Dialysis Access: LUE AVF + bruit  Filed Weights   02/04/23 1605 02/05/23 0633 02/06/23 0321  Weight: 63.7 kg 65 kg 64.7 kg    Intake/Output Summary (Last 24 hours) at 02/06/2023 1136 Last data filed at 02/06/2023 0010 Gross per 24 hour  Intake 938 ml  Output --  Net 938 ml    Additional Objective Labs: Basic Metabolic Panel: Recent Labs  Lab 02/03/23 0426 02/04/23 0258 02/06/23 0610  NA 131* 132* 132*  K 4.7 5.0 4.7  CL 93* 93* 91*  CO2 21* 25 24  GLUCOSE 376* 153* 111*  BUN 45* 25* 40*  CREATININE 7.30* 4.58* 8.04*  CALCIUM 9.9 9.8 10.1  PHOS 6.5* 5.3* 6.3*   Liver Function Tests: Recent Labs  Lab 02/03/23 0426 02/04/23 0258 02/06/23 0610  ALBUMIN 2.8* 2.6* 3.0*   No results for input(s): "LIPASE", "AMYLASE" in the last 168 hours. CBC: Recent Labs  Lab 01/31/23 0548 02/01/23 0601 02/04/23 0258 02/06/23 0610  WBC 14.2* 11.7* 8.1 12.4*  HGB 8.2* 8.6* 7.9* 8.9*  HCT 26.7* 27.1* 25.3* 26.9*  MCV 94.0 91.9 91.0 89.7  PLT 128* 167 140* 219   Blood Culture    Component Value Date/Time   SDES BLOOD BLOOD LEFT HAND 01/27/2023 1836   SPECREQUEST  01/27/2023 1836    BOTTLES DRAWN AEROBIC AND ANAEROBIC Blood Culture adequate volume   CULT  01/27/2023 1836    NO GROWTH 5 DAYS Performed at [facility] [address], [place], [place] [address]    REPTSTATUS 02/01/2023 FINAL 01/27/2023 1836    Cardiac Enzymes: No results for input(s): "CKTOTAL", "CKMB", "CKMBINDEX", "TROPONINI" in the last 168 hours. CBG: Recent Labs  Lab 02/05/23 2002 02/05/23 2357 02/06/23 0104 02/06/23 0409 02/06/23 0849  GLUCAP 112* 41* 95 119* 161*   Iron Studies: No results for input(s): "IRON", "TIBC", "TRANSFERRIN", "FERRITIN" in the last 72 hours. Lab Results  Component Value Date   INR 0.96 09/21/2017   INR 1.07 08/25/2017   Studies/Results: No results found.  Medications:   amLODipine  10 mg Oral Daily   busPIRone  15 mg Oral QHS   Chlorhexidine Gluconate Cloth  6 each Topical Daily   [START ON 02/10/2023] darbepoetin (ARANESP) injection - DIALYSIS  100 mcg Subcutaneous Q Fri-1800   diltiazem  60 mg Oral Q6H   escitalopram  20 mg Oral Daily   gabapentin  100 mg Oral TID   heparin injection (subcutaneous)  5,000 Units Subcutaneous Q8H   hydrALAZINE  100 mg Oral Q8H   insulin aspart  0-6 Units Subcutaneous TID WC   insulin aspart  1 Units Subcutaneous TID WC   insulin glargine-yfgn  12  Units Subcutaneous Daily   lanthanum  1,000 mg Oral TID with meals   levETIRAcetam  500 mg Oral BID   levothyroxine  175 mcg Oral q morning   multivitamin  1 tablet Oral QHS   pantoprazole (PROTONIX) IV  40 mg Intravenous QHS   senna-docusate  1 tablet Oral BID    Dialysis Orders: MWF [facility] [address] 3.5hr, EDW 62kg, 2K/2.5Ca bath, LUE AVF, Prev was getting Hep 2600 + 500 u/hr - now held  Assessment/Plan: 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 Slowly improving mentation/recovery. ESRD: Continue HD on MWF schedule - next HD today, no added heparin. 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 Phos improving, continue lanthanum as  binder. Acute hypoxic respiratory failure: Secondary to altered mental status in the setting of intracranial bleed.  Resolved/extubated and remains on room air without supplemental oxygen. Hypertension: BP variable, now on amlodipine + diltiazem. Dispo: Transferred to CIR on 2/17.  Tobie Poet, NP [facility] 02/06/2023,11:36 AM  LOS: 2 days

## 2023-02-06 NOTE — Progress Notes (Signed)
Patient ID: Angela [name], female   DOB: Feb 28, 1986, 37 y.o.   MRN: [id number] Met with the patient to review current situation, rehab process, team conference and plan of care. Reviewed medications, and dietary modification recommendations. Patient confirmed 10 ste bil rails; mother to assist at discharge.  Reviewed elevated A1C level and DM management with current D3/Nectar diet.  Patient given information on Renal/CMM diet. Issues with HA/hx of migraines but reported she slept fairly well. Continue to follow along to address educational needs to facilitate preparation for discharge. Margarito Liner
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
## 2023-02-06 NOTE — Progress Notes (Signed)
Physical Therapy Session Note  Patient Details  Name: Saryna Stremel MRN: [id number] Date of Birth: 02/14/1986  Today's Date: 02/06/2023 PT Individual Time: [id number] PT Individual Time Calculation (min): 71 min   Short Term Goals: Week 1:  PT Short Term Goal 1 (Week 1): STG=LTG 2/2 ELOS  Skilled Therapeutic Interventions/Progress Updates:  Patient supine in bed and asleep on entrance to room. Very slow to rouse fully and requires time until deemed safe to start therapy. Patient alert and agreeable to PT session.   Patient with no pain complaint at start of session. MD enters to round on pt and adjusts insulin meds to better follow with trend in pt's blood sugar fluctuations.   Therapeutic Activity: Bed Mobility: Pt performed supine --> sit with CGA/ MinA and asking for assist to rise up to seated position although she could have completed without physical assist. VC/ tc required for technique and effort. Seated position on EOB held with Fair to Good balance and mainly limited by fatigue.  Transfers: Pt performed sit<>stand EOB in order to complete donning of pants with MinA. Inattention and impulsivity noted with pt attempt to ambulate from EOB and loses balance to R d/t R sided lean as well as inability to advance RLE. Requires Min/ Mod A to maintain balance with no AD. Education provided that pt's brain now needs time to organize motor plan and   Gait Training:  Pt ambulated *** ft using *** with ***. Demonstrated ***. Provided vc/ tc for ***.  Wheelchair Mobility:  Pt propelled wheelchair *** feet with ***. Provided vc for ***.  Neuromuscular Re-ed: NMR facilitated during session with focus on***. Pt guided in ***. NMR performed for improvements in motor control and coordination, balance, sequencing, judgement, and self confidence/ efficacy in performing all aspects of mobility at highest level of independence.   Therapeutic Exercise: Pt performed the following exercises with vc/ tc  for proper technique. ***  Patient *** at end of session with brakes locked, *** alarm set, and all needs within reach.   Therapy Documentation Precautions:  Precautions Precautions: Fall, Other (comment) Restrictions Weight Bearing Restrictions: No General:   Vital Signs:  Pain:   Mobility:   Locomotion :    Trunk/Postural Assessment :    Balance:   Exercises:   Other Treatments:      Therapy/Group: {Therapy/Group:[id number]}  Alger Simons 02/06/2023, 8:08 AM

## 2023-02-06 NOTE — Progress Notes (Signed)
Hypoglycemic Event  CBG: 41  Treatment: 8 oz juice/soda and D50 25 mL (12.5 gm)  Symptoms: None  Follow-up CBG: Time:0104 CBG Result:95  Possible Reasons for Event: Unknown  Comments/MD notified:Charge RN Caryl Pina Notified r/t previous low blood sugars    Gildardo Pounds

## 2023-02-06 NOTE — Progress Notes (Signed)
Encouraged patient to elevate the right arm due to increase in swelling. Patient educated. PA notified.

## 2023-02-06 NOTE — Discharge Instructions (Addendum)
Inpatient Rehab Discharge Instructions  Angela [name] Discharge date and time: 02/16/2023  Activities/Precautions/ Functional Status: Activity: no lifting, driving, or strenuous exercise until cleared by MD Diet: renal diet Wound Care: none needed Functional status:  ___ No restrictions     ___ Walk up steps independently __x_ 24/7 supervision/assistance   ___ Walk up steps with assistance ___ Intermittent supervision/assistance  ___ Bathe/dress independently ___ Walk with walker     ___ Bathe/dress with assistance ___ Walk Independently    ___ Shower independently _x__ Walk with assistance    __x_ Shower with assistance ___ No alcohol     ___ Return to work/school ________  Special Instructions: No driving, alcohol consumption or tobacco use.   COMMUNITY REFERRALS UPON DISCHARGE:     Outpatient: PT     OT    ST              Agency: [facility] Phone: [phone]              Appointment Date/Time: Please allow 3-7 Days for scheduling.   Medical Equipment/Items Ordered: Tub Producer, television/film/video                                                  Agency/Supplier: [employer] [phone]  Access [place] Reservation Line: [phone]    STROKE/TIA DISCHARGE INSTRUCTIONS SMOKING Cigarette smoking nearly doubles your risk of having a stroke & is the single most alterable risk factor  If you smoke or have smoked in the last 12 months, you are advised to quit smoking for your health. Most of the excess cardiovascular risk related to smoking disappears within a year of stopping. Ask you doctor about anti-smoking medications [place] Quit Line: 1-800-QUIT NOW Free Smoking Cessation Classes [phone]  CHOLESTEROL Know your levels; limit fat & cholesterol in your diet  Lipid Panel     Component Value Date/Time   CHOL 247 (H) 01/28/2023 0300   CHOL 239 (H) 12/28/2020 1432   TRIG 88 01/31/2023 0548   HDL 119 01/28/2023 0300   HDL 72 12/28/2020 1432   CHOLHDL 2.1 01/28/2023 0300   VLDL 13  01/28/2023 0300   LDLCALC 115 (H) 01/28/2023 0300   LDLCALC 151 (H) 12/28/2020 1432     Many patients benefit from treatment even if their cholesterol is at goal. Goal: Total Cholesterol (CHOL) less than 160 Goal:  Triglycerides (TRIG) less than 150 Goal:  HDL greater than 40 Goal:  LDL (LDLCALC) less than 100   BLOOD PRESSURE American Stroke Association blood pressure target is less that 120/80 mm/Hg  Your discharge blood pressure is:  BP: (!) 154/91 Monitor your blood pressure Limit your salt and alcohol intake Many individuals will require more than one medication for high blood pressure  DIABETES (A1c is a blood sugar average for last 3 months) Goal HGBA1c is under 7% (HBGA1c is blood sugar average for last 3 months)  Diabetes: Diagnosis of diabetes:  Your A1c:12.0 %    Lab Results  Component Value Date   HGBA1C 12.0 (H) 01/28/2023    Your HGBA1c can be lowered with medications, healthy diet, and exercise. Check your blood sugar as directed by your physician Call your physician if you experience unexplained or low blood sugars.  PHYSICAL ACTIVITY/REHABILITATION Goal is 30 minutes at least 4 days per week  Activity: Increase activity slowly, Therapies: Physical  Therapy: Outpatient, Occupational Therapy: Outpatient, and Speech Therapy: Outpatient Return to work: n/a Activity decreases your risk of heart attack and stroke and makes your heart stronger.  It helps control your weight and blood pressure; helps you relax and can improve your mood. Participate in a regular exercise program. Talk with your doctor about the best form of exercise for you (dancing, walking, swimming, cycling).  DIET/WEIGHT Goal is to maintain a healthy weight  Your discharge diet is:  Diet Order             Diet regular Room service appropriate? Yes; Fluid consistency: Thin  Diet effective now                  Nectar thick liquids Your height is:  Height: 5' 4.96" (165 cm) Your current weight  is: Weight: 68.2 kg Your Body Mass Index (BMI) is:  BMI (Calculated): 25.05 Following the type of diet specifically designed for you will help prevent another stroke. Your goal weight range is:   Your goal Body Mass Index (BMI) is 19-24. Healthy food habits can help reduce 3 risk factors for stroke:  High cholesterol, hypertension, and excess weight.  RESOURCES Stroke/Support Group:  Call [phone]   STROKE EDUCATION PROVIDED/REVIEWED AND GIVEN TO PATIENT Stroke warning signs and symptoms How to activate emergency medical system (call 911). Medications prescribed at discharge. Need for follow-up after discharge. Personal risk factors for stroke. Pneumonia vaccine given: No Flu vaccine given: No My questions have been answered, the writing is legible, and I understand these instructions.  I will adhere to these goals & educational materials that have been provided to me after my discharge from the hospital.    My questions have been answered and I understand these instructions. I will adhere to these goals and the provided educational materials after my discharge from the hospital.  Patient/Caregiver Signature _______________________________ Date __________  Clinician Signature _______________________________________ Date __________  Please bring this form and your medication list with you to all your follow-up doctor's appointments.

## 2023-02-06 NOTE — Progress Notes (Signed)
Occupational Therapy Session Note  Patient Details  Name: Angela [name] MRN: [id number] Date of Birth: May 21, 1986  Today's Date: 02/06/2023 OT Individual Time: 0945-1100 OT Individual Time Calculation (min): 75 min    Short Term Goals: Week 1:  OT Short Term Goal 1 (Week 1): STGs=LTGs due to patient's length of stay.  Skilled Therapeutic Interventions/Progress Updates:    Pt initially sound asleep but was able to wake up with lights on bright and covers removed.   Pt moved well today with S to EOB, CGA squat pivot to wc, CGA with RW to ambulate to shower bench. Doffed clothing with CGA for standing balance using B arms well. IV site covered in plastic wrap. Pt sat on bench to complete shower with set up only. Ambulated back to wc to dress. Set up with donning lotion all over body and dressing.  Good use of BUE, no strength deficits noted although pt reports her L arm feels a bit weaker. No impulsivity or knee buckling.  Pt stated the hot water from the shower did help her to feel better but now she was tired and stated she needed to lay down even though next session is in 15 min.   Overall improved initiation and participation with minimal encouragement needed.    Noticed R forearm with edema, notified RN.     Therapy Documentation Precautions:  Precautions Precautions: Fall, Other (comment) Restrictions Weight Bearing Restrictions: No  Pain: Pain Assessment Pain Scale: 0-10 Pain Score: 0-No pain Faces Pain Scale: No hurt ADL: ADL Eating: Supervision/safety Where Assessed-Eating: Bed level Upper Body Bathing: Setup Where Assessed-Upper Body Bathing: Shower Lower Body Bathing: Setup Where Assessed-Lower Body Bathing: Shower Upper Body Dressing: Setup Where Assessed-Upper Body Dressing: Chair Lower Body Dressing: Setup Where Assessed-Lower Body Dressing: Chair Toileting: Minimal assistance Where Assessed-Toileting: Bedside Commode Toilet Transfer: Minimal  assistance Toilet Transfer Method: Stand pivot Science writer: Geophysical data processor: Curator Method: Heritage manager: Transfer tub bench ADL Comments: Pt requiring encouragement for independence.  Therapy/Group: Individual Therapy  [place] 02/06/2023, 10:45 AM

## 2023-02-06 NOTE — Progress Notes (Signed)
Occupational Therapy Session Note  Patient Details  Name: Angela [name] MRN: [id number] Date of Birth: 26-Jun-1986  Today's Date: 02/06/2023 OT Missed Time: 103 Minutes Missed Time Reason: Patient fatigue   Short Term Goals: Week 1:  OT Short Term Goal 1 (Week 1): STGs=LTGs due to patient's length of stay.  Skilled Therapeutic Interventions/Progress Updates:  Pt missing 30 minutes of skilled OT intervention. Pt received sleeping and difficult to arouse. Missed minutes to be made-up as appropriate.   Therapy Documentation Precautions:  Precautions Precautions: Fall, Other (comment) Restrictions Weight Bearing Restrictions: No   Therapy/Group: Individual Therapy  Maudie Mercury, OTR/L, MSOT  02/06/2023, 7:59 AM

## 2023-02-06 NOTE — Progress Notes (Signed)
Inpatient Rehabilitation  Patient information reviewed and entered into eRehab system by Sonjia Wilcoxson Meredyth Hornung, OTR/L, Rehab Quality Coordinator.   Information including medical coding, functional ability and quality indicators will be reviewed and updated through discharge.   

## 2023-02-06 NOTE — Discharge Summary (Signed)
Physician Discharge Summary  Patient ID: Angela [name] MRN: [id number] DOB/AGE: 1986-11-01 37 y.o.  Admit date: 02/04/2023 Discharge date: 02/16/2023  Discharge Diagnoses:  Principal Problem:   ICH (intracerebral hemorrhage) ([place]) Active Problems:   Uncontrolled type 1 diabetes mellitus with hypoglycemia, with long-term current use of insulin (HCC)   HTN (hypertension)   Diabetic gastroparesis associated with type 1 diabetes mellitus ([place])   ESRD on dialysis ([place])   Protein calorie malnutrition ([place]) Active problems: Secondary to intracranial hemorrhage NSTEMI status Hypertension Mood disorder Diabetes mellitus Hyperlipidemia Seizure disorder Hypothyroidism Anemia  Discharged Condition: stable  Significant Diagnostic Studies: Narrative & Impression  CLINICAL DATA:  Nausea   EXAM: PORTABLE ABDOMEN - 1 VIEW   COMPARISON:  Radiograph 02/01/2023, 01/27/2023   FINDINGS: No evidence of bowel obstruction. Moderate stool burden. Vascular calcifications. Pelvic phleboliths. No acute osseous abnormality.   IMPRESSION: No evidence of bowel obstruction.  Moderate stool burden.     Electronically Signed   By: Maurine Simmering M.D.   On: 02/14/2023 10:11    Narrative & Impression  CLINICAL DATA:  Headache with acute neurologic deficit.   EXAM: MRI HEAD WITHOUT CONTRAST   TECHNIQUE: Multiplanar, multiecho pulse sequences of the brain and surrounding structures were obtained without intravenous contrast.   COMPARISON:  Brain MRI 04/05/2022   Head CT 02/03/2023, 02/11/2023   FINDINGS: Brain: Unchanged appearance of intraparenchymal hematoma centered at the left caudate head with adjacent lateral ventricular extension. There is blood along the right frontal approach shunt tract. Mild surrounding edema. EVD has been removed. Normal white matter signal, parenchymal volume and CSF spaces. The midline structures are normal.   Vascular: Normal flow voids.   Skull and  upper cervical spine: Normal marrow signal.   Sinuses/Orbits: Right maxillary sinus retention cyst. Normal orbits.   Other: None.   IMPRESSION: Unchanged appearance of intraparenchymal hematoma centered at the left caudate head with adjacent lateral ventricular extension.     Electronically Signed   By: [name] M.D.   On: 02/11/2023 19:12      Result History   Narrative & Impression  CLINICAL DATA:  37 year old female who presented with hemorrhage into the left caudate and ventricular system on 01/27/2023 treated with EVD.   EXAM: CT HEAD WITHOUT CONTRAST   TECHNIQUE: Contiguous axial images were obtained from the base of the skull through the vertex without intravenous contrast.   RADIATION DOSE REDUCTION: This exam was performed according to the departmental dose-optimization program which includes automated exposure control, adjustment of the mA and/or kV according to patient size and/or use of iterative reconstruction technique.   COMPARISON:  Head CTs 02/03/2023 and earlier.   FINDINGS: Brain: Interval removal of the external ventricular drain. Trace blood products in the tract through the frontal lobe.   No ventriculomegaly, stable to further improve ventricle size. Fading hemorrhage at the left caudate, also decreased since 02/03/2023. Mild residual regional mass effect.   No significant intracranial mass effect.   Subtle hypodensity in the left cerebellum PICA territory is new since 01/30/2023 and persists from the prior exam. No associated mass effect. Basilar cisterns remain patent.   Elsewhere stable gray-white differentiation elsewhere.   Vascular: Calcified atherosclerosis at the skull base. No suspicious intracranial vascular hyperdensity.   Skull: Right frontal burr hole. No acute osseous abnormality identified.   Sinuses/Orbits: Continued sinus fluid or mucosal thickening but mildly improved aeration. Tympanic cavities and mastoids  remain clear.   Other: Left nasoenteric tube is probably been removed. Postoperative changes  to the superior right scalp. Negative orbits.   IMPRESSION: 1. Appearance suspicious for a subtle, subacute Left PICA cerebellar infarct. No associated hemorrhage or mass effect. MRI might be valuable to confirm.   2. Interval removal of the external ventricular drain with no adverse features. Slowly resolving left caudate hemorrhage.   These results will be called to the ordering clinician or representative by the Radiologist Assistant, and communication documented in the PACS or [employer].     Electronically Signed   By: [name] M.D.   On: 02/11/2023 13:23      Narrative & Impression  Modified Barium Swallow Study  Patient Details  Name: Angela [name] MRN: [id number] Date of Birth: 1986-03-22   Today's Date: 02/10/2023   HPI/PMH: HPI: Pt is a 37 y.o. female who presented from dialysis center for evaluation of AMS. Pt was found obtunded, and she was intubated. ETT 2/9-2/12. CT head 2/9: intraparenchymal hematoma centered at the anterior left basal ganglia with intraventricular extension. Pt s/p EVD 2/9; clamped 2/15 and removed 2/16. Pt removed Cortrak 2/16. PMH: ESRD on HD, DM, HTN, seizures, HLD, GERD, hypothyroidism, anxiety and depression.   Clinical Impression: Clinical Impression: Patient presents with a mild oropharyngeal dysphagia and pharyngoesophageal component observed with barium tablet resulting in esophageal retention near the level of the clavicles. Patient was eventually able to clear tablet with additional thin liquid rinses and secondary swallows. Oral deficits are characterized by posterior escape to the pyriforms with thin liquids and delayed bolus transport. Pharyngeal deficits are primarily characterized by reduced tongue base retraction. These functional deficits resulted in trace oral and pharyngeal residuals post swallow in which patient sensed and  independently executed secondary swallows to effectively clear. Patient had flash penetration (PAS 2) x1 with thin liquid bolus via straw when consuming barium tablet, likely attributed to mixed consistency. Otherwise, patient had excellent airway protection and had no other penetration or aspiration events among any tested consistency even when challenged with large and sequential sips via both cup and straw. SLP recommends a regular diet with thin liquids, pills whole in puree. Swallowing compensatory strategies to include small bites/sips, slow rate, upright positioning during and 30 minutes following meals, and avoidance of mixed consistencies. Recommend full supervision to ensure patient is alert and able to implement swallowing precautions/strategies. Results communicated to patient, nurse, and NT. All parties verbalized understanding and agreement with plan.   Factors that may increase risk of adverse event in presence of aspiration ([place] 2021): Factors that may increase risk of adverse event in presence of aspiration ([place] 2021): Reduced cognitive function; Poor general health and/or compromised immunity   Recommendations/Plan: Swallowing Evaluation Recommendations Swallowing Evaluation Recommendations Recommendations: PO diet PO Diet Recommendation: Regular; Thin liquids (Level 0) Liquid Administration via: Straw; Cup Medication Administration: Whole meds with puree Supervision: Patient able to self-feed; Full supervision/cueing for swallowing strategies; Set-up assistance for safety Swallowing strategies  : Slow rate; Small bites/sips; Avoid mixed consistencies Postural changes: Position pt fully upright for meals; Stay upright 30-60 min after meals Oral care recommendations: Oral care BID (2x/day)     Treatment Plan Treatment Plan Treatment recommendations: Therapy as outlined in treatment plan below Follow-up recommendations: Acute inpatient rehab (3  hours/day) Functional status assessment: Patient has had a recent decline in their functional status and demonstrates the ability to make significant improvements in function in a reasonable and predictable amount of time. Treatment frequency: Min 3x/week Treatment duration: -- (anticipated d/c from CIR 2/27) Interventions: Oropharyngeal  exercises; Patient/family education; Compensatory techniques; Aspiration precaution training; Diet toleration management by SLP     Recommendations Recommendations for follow up therapy are one component of a multi-disciplinary discharge planning process, led by the attending physician.  Recommendations may be updated based on patient status, additional functional criteria and insurance authorization.   Assessment: Orofacial Exam: Orofacial Exam Oral Cavity: Oral Hygiene: WFL Oral Cavity - Dentition: Adequate natural dentition Orofacial Anatomy: WFL Oral Motor/Sensory Function: WFL     Anatomy:  Anatomy: WFL   Thin Liquids: Thin Liquids (Level 0) Thin Liquids : Impaired Bolus delivery method: Cup Thin Liquid - Impairment: Oral Impairment; Pharyngeal impairment Lip Closure: No labial escape Tongue control during bolus hold: Escape to lateral buccal cavity/floor of mouth; Posterior escape of less than half of bolus Bolus transport/lingual motion: Slow tongue motion Oral residue: Residue collection on oral structures Location of oral residue : Tongue Initiation of swallow : Pyriform sinuses Soft palate elevation: No bolus between soft palate (SP)/pharyngeal wall (PW) Laryngeal elevation: Complete superior movement of thyroid cartilage with complete approximation of arytenoids to epiglottic petiole Anterior hyoid excursion: Complete Epiglottic movement: Complete Laryngeal vestibule closure: Complete, no air/contrast in laryngeal vestibule Pharyngeal stripping wave : Present - complete Pharyngeal contraction (A/P view only): N/A Pharyngoesophageal  segment opening: Complete distension and complete duration, no obstruction of flow Tongue base retraction: Narrow column of contrast or air between tongue base and PPW Pharyngeal residue: Trace residue within or on pharyngeal structures Location of pharyngeal residue: Tongue base Penetration/Aspiration Scale (PAS) score: 1.  Material does not enter airway     Mildly Thick Liquids: Mildly thick liquids (Level 2, nectar thick) Mildly thick liquids (Level 2, nectar thick): Impaired Bolus delivery method: Cup Mildly Thick Liquid - Impairment: Oral Impairment; Pharyngeal impairment Lip Closure: No labial escape Tongue control during bolus hold: Cohesive bolus between tongue to palatal seal Bolus transport/lingual motion: Slow tongue motion Oral residue: Trace residue lining oral structures Location of oral residue : Tongue Initiation of swallow : Valleculae Soft palate elevation: Trace column of contrast or air between SP and PW Laryngeal elevation: Complete superior movement of thyroid cartilage with complete approximation of arytenoids to epiglottic petiole Anterior hyoid excursion: Complete Epiglottic movement: Complete Laryngeal vestibule closure: Complete, no air/contrast in laryngeal vestibule Pharyngeal stripping wave : Present - complete Pharyngeal contraction (A/P view only): N/A Pharyngoesophageal segment opening: Complete distension and complete duration, no obstruction of flow Tongue base retraction: Narrow column of contrast or air between tongue base and PPW Pharyngeal residue: Trace residue within or on pharyngeal structures Location of pharyngeal residue: Tongue base Penetration/Aspiration Scale (PAS) score: 1.  Material does not enter airway     Moderately Thick Liquids: Moderately thick liquids (Level 3, honey thick) Moderately thick liquids (Level 3, honey thick): Impaired Bolus delivery method: Spoon Moderately Thick Liquid - Impairment: Oral Impairment; Pharyngeal  impairment Lip Closure: No labial escape Tongue control during bolus hold: Cohesive bolus between tongue to palatal seal Bolus transport/lingual motion: Repetitive/disorganized tongue motion Oral residue: Trace residue lining oral structures Location of oral residue : Palate; Tongue Initiation of swallow : Valleculae Soft palate elevation: Trace column of contrast or air between SP and PW Laryngeal elevation: Complete superior movement of thyroid cartilage with complete approximation of arytenoids to epiglottic petiole Anterior hyoid excursion: Complete Epiglottic movement: Complete Laryngeal vestibule closure: Complete, no air/contrast in laryngeal vestibule Pharyngeal stripping wave : Present - complete Pharyngeal contraction (A/P view only): N/A Pharyngoesophageal segment opening: Complete distension and complete duration, no  obstruction of flow Tongue base retraction: Narrow column of contrast or air between tongue base and PPW Pharyngeal residue: Trace residue within or on pharyngeal structures Location of pharyngeal residue: Tongue base Penetration/Aspiration Scale (PAS) score: 1.  Material does not enter airway     Puree: Puree Puree: Impaired Puree - Impairment: Oral Impairment; Pharyngeal impairment Lip Closure: No labial escape Bolus transport/lingual motion: Repetitive/disorganized tongue motion Oral residue: Trace residue lining oral structures Location of oral residue : Tongue; Palate Initiation of swallow: Valleculae Soft palate elevation: Trace column of contrast or air between SP and PW Laryngeal elevation: Complete superior movement of thyroid cartilage with complete approximation of arytenoids to epiglottic petiole Anterior hyoid excursion: Complete Epiglottic movement: Complete Laryngeal vestibule closure: Complete, no air/contrast in laryngeal vestibule Pharyngeal stripping wave : Present - complete Pharyngeal contraction (A/P view only):  N/A Pharyngoesophageal segment opening: Complete distension and complete duration, no obstruction of flow Tongue base retraction: Trace column of contrast or air between tongue base and PPW Pharyngeal residue: Trace residue within or on pharyngeal structures Location of pharyngeal residue: Tongue base Penetration/Aspiration Scale (PAS) score: 1.  Material does not enter airway     Solid: Solid Solid: Impaired Solid - Impairment: Pharyngeal impairment; Oral Impairment Lip Closure: No labial escape Bolus preparation/mastication: Timely and efficient chewing and mashing Bolus transport/lingual motion: Slow tongue motion Oral residue: Trace residue lining oral structures Location of oral residue : Tongue Initiation of swallow: Valleculae Soft palate elevation: Trace column of contrast or air between SP and PW Laryngeal elevation: Complete superior movement of thyroid cartilage with complete approximation of arytenoids to epiglottic petiole Anterior hyoid excursion: Complete Epiglottic movement: Complete Laryngeal vestibule closure: Complete, no air/contrast in laryngeal vestibule Pharyngeal stripping wave : Present - complete Pharyngeal contraction (A/P view only): N/A Pharyngoesophageal segment opening: Complete distension and complete duration, no obstruction of flow Tongue base retraction: Trace column of contrast or air between tongue base and PPW Pharyngeal residue: Trace residue within or on pharyngeal structures Location of pharyngeal residue: Tongue base; Valleculae; Pyriform sinuses Penetration/Aspiration Scale (PAS) score: 1.  Material does not enter airway     Pill: Pill Pill: Impaired Consistency administered : thin Pill - Impairment: Oral Impairment; Pharyngeal impairment; Esophageal impairment Lip Closure: No labial escape Bolus transport/lingual motion: Repetitive/disorganized tongue motion Oral residue: Trace residue lining oral structures Location of oral residue  : Tongue Initiation of swallow : Pyriform sinuses Soft palate elevation: Trace column of contrast or air between SP and PW Laryngeal elevation: Complete superior movement of thyroid cartilage with complete approximation of arytenoids to epiglottic petiole Anterior hyoid excursion: Complete Epiglottic movement: Complete Laryngeal vestibule closure: Complete, no air/contrast in laryngeal vestibule Pharyngeal stripping wave : Present - complete Pharyngoesophageal segment opening: Complete distension and complete duration, no obstruction of flow Tongue base retraction: Narrow column of contrast or air between tongue base and PPW Pharyngeal residue: Trace residue within or on pharyngeal structures Location of pharyngeal residue: Tongue base Penetration/Aspiration Scale (PAS) score: 2.  Material enters airway, remains ABOVE vocal cords then ejected out Esophageal impairment: Esophageal retention     Compensatory Strategies: Compensatory Strategies Compensatory strategies: Yes Multiple swallows: Effective Effective Multiple Swallows: Thin liquid (Level 0); Mildly thick liquid (Level 2, nectar thick); Moderately thick liquid (Level 3, honey thick); Puree; Solid; Pill Liquid wash: Effective Effective Liquid Wash: Pill      General Information: No data recorded  Diet Prior to this Study: Regular; Mildly thick liquids (Level 2, nectar thick)    Temperature :  Normal    Respiratory Status: WFL    Supplemental O2: None (Room air)    History of Recent Intubation: Yes   Behavior/Cognition: Lethargic/Drowsy; Cooperative; Pleasant mood; Requires cueing   Self-Feeding Abilities: Able to self-feed; Needs set-up for self-feeding   Baseline vocal quality/speech: Normal   Volitional Cough: Able to elicit   Volitional Swallow: Able to elicit   Exam Limitations: No limitations     Goal Planning: Prognosis for improved oropharyngeal function: Good   Barriers to Reach Goals: Cognitive  deficits; Motivation   Barriers/Prognosis Comment: Pt requires significant coaxing to participate, thus interest and/or participation in treatment may be reduced   Patient/Family Stated Goal: "go home"   Consulted and agree with results and recommendations: Patient; Nurse Tech; Nurse   Pain: Pain Assessment Pain Assessment: Faces Faces Pain Scale: 0 Facial Expression: 0 Body Movements: 0 Muscle Tension: 0 Compliance with ventilator (intubated pts.): 0 Vocalization (extubated pts.): N/A CPOT Total: 0 Pain Location: lower extremities and buttocks Pain Descriptors / Indicators: Discomfort Pain Intervention(s): Repositioned; Monitored during session    SLP visit diagnosis: SLP Visit Diagnosis: Dysphagia, oropharyngeal phase (R13.12); Dysphagia, pharyngoesophageal phase (R13.14)     Labs:  Basic Metabolic Panel: Recent Labs  Lab 02/11/23 1207 02/15/23 0717 02/15/23 1724 02/16/23 0700  NA 130* 119*  --  131*  K 3.7 5.7*  --  4.4  CL 92* 84*  --  93*  CO2 29 21*  --  26  GLUCOSE 76 568* 263* 98  BUN 15 26*  --  21*  CREATININE 5.77* 6.94*  --  5.30*  CALCIUM 9.8 9.5  --  10.0  PHOS  --  4.8*  --  4.6    CBC: Recent Labs  Lab 02/11/23 1207 02/15/23 0851  WBC 11.1* 7.9  NEUTROABS 7.7  --   HGB 8.7* 9.4*  HCT 26.5* 30.1*  MCV 92.0 95.6  PLT 173 189    CBG: Recent Labs  Lab 02/15/23 2358 02/16/23 0358 02/16/23 0752 02/16/23 0912 02/16/23 1203  GLUCAP 72 121* 195* 302* 295*    Brief HPI:   Angela [name] is a 37 y.o. female with a history of ESRD on HD who presented to [facility] ED unresponsiveness and change in mental status at HD clinic on 01/27/2023. She awaked to tactile stimulus and was moving all four extremities. 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 CTA of head performed. Hypertension (sys 200s) treated with IV labetalol and Cleviprex. Her mental status worsened and she  was intubated for airway protection. Neurosurgery and CCM consulted and continued to follow. Ventriculostomy drain placed on 2/09. Required hypertonic saline. Hemoglobin down to 6.4 and given one unit of PRBCs. Heparin [place] started for VTE prophy. Cortrak placed. She has a history of seizure disorder on Keppra, uncontrolled DM-1 (A1c = 12.0). Nephrology consulted for HD orders. Repeat CTH on 2/12 with decreased size of ventricles. She was extubated on 2/12. Neuro status improved and EVD clamped on 2/15. Repeat CT head on 2/16 without hydrocephalus and significant clearing of IVH. EVD removed and NS signed off. Swallow evaluation on 2/16.    Hospital Course: Angela [name] was admitted to rehab 02/04/2023 for inpatient therapies to consist of PT, ST and OT at least three hours five days a week. Past admission physiatrist, therapy team and rehab RN have worked together to provide customized collaborative inpatient rehab.  Hypoglycemic events x 2 and placed on very sensitive sliding scale insulin.  Noted left forearm edema on 2/19. No tenderness or increased warmth. Left upper arm PIV removed and RUE venous duplex negative for  DVT. Overall she remained very fatigued in between therapy sessions and HD treatments. Likely due to [place] and/or gabapentin.  Follow-up labs on 2/22 revealed stable hemoglobin of 8.9.  White blood cell count normal. Code stroke called on 2/24 for persistent lethargy, nausea with vomiting. CT head performed and neurology consulted. Most likely due to Fioricet she was given for headache. This was discontinued. Hypoglycemia persisted as well. MRI on 2/24 stable.  On 2/28 follow-up labs revealed a sodium of 119 and a glucose of 568.  She was already receiving hemodialysis and nephrologist on board for management.  Hospitalist service consulted for assistance with diabetes management. Inpatient diabetic coordinator consulted. Recommended splitting Semglee dosing to 6 units BID.  Blood pressures  were monitored on TID basis and Norvasc 10 mg daily, Cardizem 60 mg every 6 hours, hydralazine 100 mg every 8 hours continued.  Labile pulse and mildly elevated blood pressure noted on 2/22.  Low-dose Coreg 3.125 mg twice daily started. Increased to 6.25 mg BID.  Diabetes has been monitored with ac/hs CBG checks and SSI was use prn for tighter BS control.  Sliding scale insulin changed to very sensitive scale due to hypoglycemic events on 2/18.  Semglee decreased to 12 units daily.  NovoLog 1 unit 3 times daily with meals added as per diabetic coordinator recommendations. Semglee decreased further to 10 units daily. Final insulin plan at discharge is Semglee 6 units BID. Further strategies and education completed with diabetic coordinator and strong encouragement to follow-up with endocrinology.   Rehab course: During patient's stay in rehab weekly team conferences were held to monitor patient's progress, set goals and discuss barriers to discharge. At admission, patient required min assist with mobility and min to light moderate assist for functional transfers and overall set up to min assist for BADL's.  She  has had improvement in activity tolerance, balance, postural control as well as ability to compensate for deficits. She has had improvement in functional use RUE/LUE  and RLE/LLE as well as improvement in awareness   Discharge disposition: 01-Home or Self Care      Diet: carb modified  Special Instructions: No driving, alcohol consumption or tobacco use.  Strongly encouraged to follow-up with endocrinology for insulin pump calibration, CBG monitoring, family education.   Discharge Instructions     Ambulatory referral to Neurology   Complete by: As directed    An appointment is requested in approximately: 4 weeks   Ambulatory referral to Physical Medicine Rehab   Complete by: As directed    Hospital follow-up   Discharge patient   Complete by: As directed    Discharge  disposition: 01-Home or Self Care   Discharge patient date: 02/14/2023      Allergies as of 02/16/2023       Reactions   Bactrim [sulfamethoxazole-trimethoprim] Hives, Itching   Feraheme [ferumoxytol] Itching   Zestril [lisinopril] Other (See Comments)   Hyperkalemia         Medication List     STOP taking these medications    losartan 50 MG tablet Commonly known as: COZAAR       TAKE these medications    acetaminophen 325 MG tablet Commonly known as: TYLENOL Take 2 tablets (650 mg total) by mouth every 6 (six) hours as needed for headache.   albuterol 108 (90 Base) MCG/ACT inhaler Commonly known as: Proventil HFA  Inhale 2 puffs into the lungs every 6 (six) hours as needed for wheezing or shortness of breath.   amLODipine 10 MG tablet Commonly known as: NORVASC Take 1 tablet (10 mg total) by mouth at bedtime. What changed: when to take this   busPIRone 15 MG tablet Commonly known as: BUSPAR TAKE ONE TABLET BY MOUTH EVERYDAY AT BEDTIME What changed: See the new instructions.   carvedilol 6.25 MG tablet Commonly known as: COREG Take 1 tablet (6.25 mg total) by mouth 2 (two) times daily with a meal.   Comfort EZ Pen Needles 32G X 4 MM Misc Generic drug: Insulin Pen Needle USE TO INJECT insulin ONCE DAILY AS DIRECTED   Darbepoetin Alfa 100 MCG/0.5ML Sosy injection Commonly known as: ARANESP Inject 0.5 mLs (100 mcg total) into the skin every Friday at 6 PM.   Dexcom G6 Sensor Misc APPLY 1 SENSOR EVERY 10 DAYS   Dexcom G6 Sensor Misc SMARTSIG:1 Topical Every 10 Days   Dexcom G6 Transmitter Misc See admin instructions.   diltiazem 60 MG tablet Commonly known as: CARDIZEM Take 1 tablet (60 mg total) by mouth every 6 (six) hours.   escitalopram 20 MG tablet Commonly known as: LEXAPRO TAKE ONE TABLET BY MOUTH ONCE DAILY   gabapentin 100 MG capsule Commonly known as: NEURONTIN Take 1 capsule (100 mg total) by mouth 3 (three) times daily.    hydrALAZINE 100 MG tablet Commonly known as: APRESOLINE Take 100 mg by mouth 3 (three) times daily.   insulin glargine-yfgn 100 UNIT/ML injection Commonly known as: SEMGLEE Inject 0.06 mLs (6 Units total) into the skin 2 (two) times daily.   Insulin Syringe-Needle U-100 31G X 15/64" 1 ML Misc Commonly known as: BD Insulin Syringe Ultrafine Used to give daily insulin injections.   lanthanum 1000 MG chewable tablet Commonly known as: FOSRENOL Chew 1 tablet (1,000 mg total) by mouth with breakfast, with lunch, and with evening meal.   levETIRAcetam 500 MG tablet Commonly known as: KEPPRA Take 1 tablet (500 mg total) by mouth 2 (two) times daily.   levothyroxine 175 MCG tablet Commonly known as: SYNTHROID Take 175 mcg by mouth every morning.   lidocaine 5 % Commonly known as: LIDODERM Place 1 patch onto the skin daily. Remove & Discard patch within 12 hours or as directed by MD   metoCLOPramide 5 MG tablet Commonly known as: REGLAN Take 1 tablet (5 mg total) by mouth 2 (two) times daily with a meal.   multivitamin Tabs tablet Take 1 tablet by mouth at bedtime.   NovoLOG FlexPen 100 UNIT/ML FlexPen Generic drug: insulin aspart Inject 2-8 Units into the skin 3 (three) times daily with meals. Per sliding scale   ondansetron 4 MG disintegrating tablet Commonly known as: ZOFRAN-ODT '4mg'$  ODT q4 hours prn nausea/vomit What changed:  how much to take how to take this when to take this reasons to take this additional instructions   pantoprazole 40 MG tablet Commonly known as: PROTONIX Take 1 tablet (40 mg total) by mouth daily.   senna-docusate 8.6-50 MG tablet Commonly known as: Senokot-S Take 1 tablet by mouth 2 (two) times daily.   Tyler Aas FlexTouch 100 UNIT/ML FlexTouch Pen Generic drug: insulin degludec Inject 12 Units into the skin daily.        Follow-up Information     [name], MD Follow up.   Specialty: Internal Medicine Why: Call in 1-2  days for hospital follow-up appointment Contact information: [facility] Follow up.   Why: Call in 1-2 days for hospital follow-up appointment. Contact information: [address]     [address] [place] [phone]        Charlett Blake, MD Follow up.   Specialty: Physical Medicine and Rehabilitation Why: office will call you to arrange your appt (sent) Contact information: [place] [address] [phone]         Kathlene Cote, MD Follow up.   Specialty: Endocrinology Why: Call in 1-2 days for hospital follow-up appointment Contact information: [address] [place] [address] [phone]         [place] on 02/17/2023.   Why: Schedule is Monday/Wednesday/Friday with 12:15 chair time.  On Friday, please arrive at 11:30 to complete paperwork prior to treatment. Contact information: [place] [address] [phone]                 Signed: Barbie Banner 02/17/2023, 8:40 AM

## 2023-02-06 NOTE — Progress Notes (Addendum)
Notified by attnedant LPN of edema of right upper extremity. She denies pain or numbness. Has PIV in upper arm. Focal edema of volar aspect of left forearm and  mild edema of hand. No tenderness to palpation. Will check RUE venous duplex. Discontinue PIV as she is not getting IV medications now.

## 2023-02-07 ENCOUNTER — Inpatient Hospital Stay (HOSPITAL_COMMUNITY): Payer: Medicaid Other

## 2023-02-07 DIAGNOSIS — M7989 Other specified soft tissue disorders: Secondary | ICD-10-CM | POA: Diagnosis not present

## 2023-02-07 LAB — GLUCOSE, CAPILLARY
Glucose-Capillary: 117 mg/dL — ABNORMAL HIGH (ref 70–99)
Glucose-Capillary: 117 mg/dL — ABNORMAL HIGH (ref 70–99)
Glucose-Capillary: 185 mg/dL — ABNORMAL HIGH (ref 70–99)
Glucose-Capillary: 210 mg/dL — ABNORMAL HIGH (ref 70–99)
Glucose-Capillary: 225 mg/dL — ABNORMAL HIGH (ref 70–99)
Glucose-Capillary: 280 mg/dL — ABNORMAL HIGH (ref 70–99)

## 2023-02-07 LAB — HEPATITIS B SURFACE ANTIBODY, QUANTITATIVE: Hep B S AB Quant (Post): 38.8 m[IU]/mL (ref >9.9)

## 2023-02-07 MED ORDER — PANTOPRAZOLE SODIUM 40 MG PO TBEC
40.0000 mg | DELAYED_RELEASE_TABLET | Freq: Every day | ORAL | Status: DC
  Administered 2023-02-07 – 2023-02-16 (×9): 40 mg via ORAL
  Filled 2023-02-07 (×10): qty 1

## 2023-02-07 MED ORDER — GLUCAGON HCL RDNA (DIAGNOSTIC) 1 MG IJ SOLR: 1.0000 mg | Freq: Once | INTRAMUSCULAR | Status: DC | PRN

## 2023-02-07 MED ORDER — HEPARIN SODIUM (PORCINE) 1000 UNIT/ML DIALYSIS: 1000.0000 [IU] | INTRAMUSCULAR | Status: DC | PRN

## 2023-02-07 MED ORDER — ALTEPLASE 2 MG IJ SOLR: 2.0000 mg | Freq: Once | INTRAMUSCULAR | Status: DC | PRN

## 2023-02-07 NOTE — Inpatient Diabetes Management (Signed)
Inpatient Diabetes Program Recommendations  AACE/ADA: New Consensus Statement on Inpatient Glycemic Control (2015)  Target Ranges:  Prepandial:   less than 140 mg/dL      Peak postprandial:   less than 180 mg/dL (1-2 hours)      Critically ill patients:  140 - 180 mg/dL   Lab Results  Component Value Date   GLUCAP 185 (H) 02/07/2023   HGBA1C 12.0 (H) 01/28/2023    Review of Glycemic Control  Latest Reference Range & Units 02/06/23 08:49 02/06/23 11:46 02/06/23 21:34 02/06/23 22:09 02/06/23 22:59 02/07/23 00:13 02/07/23 04:12 02/07/23 08:25  Glucose-Capillary 70 - 99 mg/dL 161 (H) 147 (H) 55 (L) 63 (L) 92 117 (H) 117 (H) 185 (H)  (H): Data is abnormally high (L): Data is abnormally low   Diabetes history: Type 1 DM Outpatient Diabetes medications: Novolog 2-8 units tid with meals,Tresiba 12 units daily Current orders for Inpatient glycemic control: Novolog 0-6 units TID,  Novolog 1 units TID, Semglee 12 units daily  Inpatient Diabetes Program Recommendations:    Continues to have hypoglycemia in the evening.  Might consider:  Semglee 10 units QD  Will continue to follow while inpatient.  Thank you, Reche Dixon, MSN, [place] Diabetes Coordinator Inpatient Diabetes Program [phone] (team pager from 8a-5p)

## 2023-02-07 NOTE — Progress Notes (Incomplete)
PROGRESS NOTE   Subjective/Complaints:  Pt confused and pulled IV out of RUE Has RUE swelling and doppler is neg for DVT Intermittent low CBGs  LUE AVG  ROS: denies shortness of breath  Objective:   VAS [place] UPPER EXTREMITY VENOUS DUPLEX  Result Date: 02/07/2023 UPPER VENOUS STUDY  Patient Name:  Angela [name]  Date of Exam:   02/07/2023 Medical Rec #: [id number]       Accession #:    [id number] Date of Birth: 07-Apr-1986       Patient Gender: F Patient Age:   37 years Exam Location:  [facility] Procedure:      VAS [place] UPPER EXTREMITY VENOUS DUPLEX Referring Phys: Risa Grill --------------------------------------------------------------------------------  Indications: Swelling Comparison Study: no prior Performing Technologist: Archie Patten RVS  Examination Guidelines: A complete evaluation includes B-mode imaging, spectral Doppler, color Doppler, and power Doppler as needed of all accessible portions of each vessel. Bilateral testing is considered an integral part of a complete examination. Limited examinations for reoccurring indications may be performed as noted.  Right Findings: +----------+------------+---------+-----------+----------+-------------+ RIGHT     CompressiblePhasicitySpontaneousProperties   Summary    +----------+------------+---------+-----------+----------+-------------+ IJV           Full                                  rouleaux flow +----------+------------+---------+-----------+----------+-------------+ Subclavian    Full       Yes       Yes                            +----------+------------+---------+-----------+----------+-------------+ Axillary      Full       Yes       Yes                            +----------+------------+---------+-----------+----------+-------------+ Brachial      Full       Yes       Yes                             +----------+------------+---------+-----------+----------+-------------+ Radial        Full       Yes       Yes                            +----------+------------+---------+-----------+----------+-------------+ Ulnar         Full                                                +----------+------------+---------+-----------+----------+-------------+ Cephalic      Full                                                +----------+------------+---------+-----------+----------+-------------+  Basilic       Full                                                +----------+------------+---------+-----------+----------+-------------+  Left Findings: +----------+------------+---------+-----------+----------+-------+ LEFT      CompressiblePhasicitySpontaneousPropertiesSummary +----------+------------+---------+-----------+----------+-------+ Subclavian               Yes       Yes                      +----------+------------+---------+-----------+----------+-------+  Summary:  Right: No evidence of deep vein thrombosis in the upper extremity. No evidence of superficial vein thrombosis in the upper extremity. Rouleaux flow noted in the IJV.  *See table(s) above for measurements and observations.    Preliminary    Recent Labs    02/06/23 0610  WBC 12.4*  HGB 8.9*  HCT 26.9*  PLT 219   Recent Labs    02/06/23 0610  NA 132*  K 4.7  CL 91*  CO2 24  GLUCOSE 111*  BUN 40*  CREATININE 8.04*  CALCIUM 10.1    Intake/Output Summary (Last 24 hours) at 02/07/2023 0933 Last data filed at 02/07/2023 [id number] Gross per 24 hour  Intake 118 ml  Output 2402 ml  Net -2284 ml        Physical Exam: Vital Signs Blood pressure (!) 152/89, pulse 95, temperature 98.8 F (37.1 C), resp. rate 18, height 5' 4.96" (1.65 m), weight 65.1 kg, SpO2 98 %.   General: No acute distress Mood and affect are appropriate Heart: Regular rate and rhythm no rubs murmurs or extra sounds Lungs: Clear  to auscultation, breathing unlabored, no rales or wheezes Abdomen: Positive bowel sounds, soft nontender to palpation, nondistended Extremities: No clubbing, cyanosis, or edema   Skin:LUE AVF Neuro: 4/5 throughout, sensation is intact. Can follow commands. Impaired safety awareness. Can recall 2/3 objects. Hypophonic. Sensation intact   Assessment/Plan: 1. Functional deficits which require 3+ hours per day of interdisciplinary therapy in a comprehensive inpatient rehab setting. Physiatrist is providing close team supervision and 24 hour management of active medical problems listed below. Physiatrist and rehab team continue to assess barriers to discharge/monitor patient progress toward functional and medical goals  Care Tool:  Bathing    Body parts bathed by patient: Right arm, Left arm, Chest, Abdomen, Front perineal area, Buttocks, Right upper leg, Left upper leg, Right lower leg, Left lower leg, Face         Bathing assist Assist Level: Set up assist     Upper Body Dressing/Undressing Upper body dressing   What is the patient wearing?: Pull over shirt    Upper body assist Assist Level: Set up assist    Lower Body Dressing/Undressing Lower body dressing      What is the patient wearing?: Underwear/pull up     Lower body assist Assist for lower body dressing: Set up assist     Toileting Toileting Toileting Activity did not occur (Clothing management and hygiene only): N/A (no void or bm)  Toileting assist Assist for toileting: Minimal Assistance - Patient > 75%     Transfers Chair/bed transfer  Transfers assist     Chair/bed transfer assist level: Contact Guard/Touching assist     Locomotion Ambulation   Ambulation assist      Assist level: Minimal Assistance - Patient > 75% Assistive  device: Other (comment) (at gait belt and L shoulder) Max distance: 161   Walk 10 feet activity   Assist     Assist level: Minimal Assistance - Patient >  75% Assistive device: Other (comment) (at gait belt and L shoulder)   Walk 50 feet activity   Assist    Assist level: Minimal Assistance - Patient > 75% Assistive device: Other (comment) (at gait belt and L shoulder)    Walk 150 feet activity   Assist    Assist level: Minimal Assistance - Patient > 75% Assistive device: Other (comment) (at gait belt and L shoulder)    Walk 10 feet on uneven surface  activity   Assist     Assist level: Minimal Assistance - Patient > 75% (ramp) Assistive device: Other (comment) (at gait belt and L shoulder and does reach for external support if available.)   Wheelchair     Assist Is the patient using a wheelchair?: Yes Type of Wheelchair: Manual    Wheelchair assist level: Supervision/Verbal cueing Max wheelchair distance: 100    Wheelchair 50 feet with 2 turns activity    Assist    Wheelchair 50 feet with 2 turns activity did not occur:  (per PT documentation)   Assist Level: Supervision/Verbal cueing   Wheelchair 150 feet activity     Assist      Assist Level: Moderate Assistance - Patient 50 - 74% (per PT documentation of 100 feet max distance)   Blood pressure (!) 152/89, pulse 95, temperature 98.8 F (37.1 C), resp. rate 18, height 5' 4.96" (1.65 m), weight 65.1 kg, SpO2 98 %.  Medical Problem List and Plan: 1. Functional deficits secondary to [place] left caudate with resolving IVH - uncontrolled              -patient may shower             -ELOS/Goals: 7-10 days S             Admit to CIR   2.  Antithrombotics: -DVT/anticoagulation:  Pharmaceutical: Heparin             -antiplatelet therapy: none   3. Pain Management: Tylenol as needed   4. Mood/Behavior/Sleep: LCSW to evaluate and provide emotional support             -depression/anxiety: continue Buspar and Lexapro             -antipsychotic agents: n/a   5. Neuropsych/cognition: This patient not capable of making decisions on her own behalf.    6. Skin/Wound Care: Routine skin care checks   7. Fluids/Electrolytes/Nutrition: strict Is and Os; chemistries as per nephrology             -TF d/c ed             -continue Fosrenol, MVI, Aranesp   8: Hypertension: monitor TID and prn (home Lasix, losartan held>>other home meds restarted             -continue amlodipine 10 mg daily             -diltiazem 60 mg TID             -hydralazine 100 mg TID     Vitals:   02/07/23 0413 02/07/23 0414  BP: (!) 152/89 (!) 152/89  Pulse: 95 95  Resp:  18  Temp: 98.8 F (37.1 C) 98.8 F (37.1 C)  SpO2: 98% 98%    9: Hyperlipidemia: home statin held  10: Hydrocephalus: resolved; EVD placed 2/09, removed 2/16   11: ESRD: HD on M/W/F via LUE AVF   12: DM-1; uncontolled: continue CBGs q 4 hours. Sensitive sliding scale changed to very sensitive given CBG of 32 on admission -decrease Semglee to 12 units daily -reduce novalog to  1U TID novolog with meals as per diabetic coordinator recommendations. Hold if po intake <50%  CBG (last 3)  Recent Labs    02/07/23 0013 02/07/23 0412 02/07/23 0825  GLUCAP 117* 117* 185*   Used tresiba and novalog, now on Semglee improved     13: Seizure disorder: home dose Keppra resumed             continue Keppra 500 mg BID             -gabapentin 100 mg TID   14: Hypothyroidism: continue Synthroid   15: Anemia, chronic disease: s/p 1 unit PCs, follow-up CBC    LOS: 3 days A FACE TO FACE EVALUATION WAS PERFORMED  Charlett Blake 02/07/2023, 9:33 AM

## 2023-02-07 NOTE — Progress Notes (Signed)
PROGRESS NOTE   Subjective/Complaints:  Continues to have hypoglycemia in the evening. 55/63~2100 hours last night Sleeping; awakens to loud voice. Oriented.   ROS: denies shortness of breath  Objective:   No results found. Recent Labs    02/04/23 0258 02/06/23 0610  WBC 8.1 12.4*  HGB 7.9* 8.9*  HCT 25.3* 26.9*  PLT 140* 219    Recent Labs    02/04/23 0258 02/06/23 0610  NA 132* 132*  K 5.0 4.7  CL 93* 91*  CO2 25 24  GLUCOSE 153* 111*  BUN 25* 40*  CREATININE 4.58* 8.04*  CALCIUM 9.8 10.1     Intake/Output Summary (Last 24 hours) at 02/06/2023 0756 Last data filed at 02/06/2023 0010 Gross per 24 hour  Intake 1058 ml  Output --  Net 1058 ml        Physical Exam: Vital Signs Blood pressure (!) 166/74, pulse 88, temperature 98.2 F (36.8 C), resp. rate 16, height 5' 4.96" (1.65 m), weight 64.7 kg, SpO2 98 %.   General: No acute distress, drowsy  Mood and affect are appropriate Heart: Regular rate and rhythm no rubs murmurs or extra sounds Lungs: Clear to auscultation, breathing unlabored, no rales or wheezes Abdomen: Positive bowel sounds, soft nontender to palpation, nondistended Extremities: right forearm edema, PIV out   Skin:LUE AVF Neuro: 4/5 throughout, sensation is intact. Can follow commands. Impaired safety awareness. Can recall 2/3 objects. Hypophonic. Sensation intact   Assessment/Plan: 1. Functional deficits which require 3+ hours per day of interdisciplinary therapy in a comprehensive inpatient rehab setting. Physiatrist is providing close team supervision and 24 hour management of active medical problems listed below. Physiatrist and rehab team continue to assess barriers to discharge/monitor patient progress toward functional and medical goals  Care Tool:  Bathing    Body parts bathed by patient: Right arm, Left arm, Chest, Abdomen, Front perineal area, Buttocks, Right  upper leg, Left upper leg, Right lower leg, Left lower leg, Face         Bathing assist Assist Level: Set up assist     Upper Body Dressing/Undressing Upper body dressing   What is the patient wearing?: Dress, Bra    Upper body assist Assist Level: Minimal Assistance - Patient > 75% (Due to tight fit.)    Lower Body Dressing/Undressing Lower body dressing      What is the patient wearing?: Underwear/pull up     Lower body assist Assist for lower body dressing: Set up assist     Toileting Toileting Toileting Activity did not occur (Clothing management and hygiene only): N/A (no void or bm)  Toileting assist       Transfers Chair/bed transfer  Transfers assist     Chair/bed transfer assist level: Minimal Assistance - Patient > 75%     Locomotion Ambulation   Ambulation assist      Assist level: Minimal Assistance - Patient > 75% Assistive device: Other (comment) (at gait belt and L shoulder) Max distance: 161   Walk 10 feet activity   Assist     Assist level: Minimal Assistance - Patient > 75% Assistive device: Other (comment) (at gait belt and L shoulder)  Walk 50 feet activity   Assist    Assist level: Minimal Assistance - Patient > 75% Assistive device: Other (comment) (at gait belt and L shoulder)    Walk 150 feet activity   Assist    Assist level: Minimal Assistance - Patient > 75% Assistive device: Other (comment) (at gait belt and L shoulder)    Walk 10 feet on uneven surface  activity   Assist     Assist level: Minimal Assistance - Patient > 75% (ramp) Assistive device: Other (comment) (at gait belt and L shoulder and does reach for external support if available.)   Wheelchair     Assist Is the patient using a wheelchair?: Yes Type of Wheelchair: Manual    Wheelchair assist level: Supervision/Verbal cueing Max wheelchair distance: 100    Wheelchair 50 feet with 2 turns activity    Assist             Wheelchair 150 feet activity     Assist      Assist Level: Supervision/Verbal cueing, Minimal Assistance - Patient > 75%   Blood pressure (!) 166/74, pulse 88, temperature 98.2 F (36.8 C), resp. rate 16, height 5' 4.96" (1.65 m), weight 64.7 kg, SpO2 98 %.  Medical Problem List and Plan: 1. Functional deficits secondary to [place] left caudate with resolving IVH - uncontrolled              -patient may shower             -ELOS/Goals: 7-10 days S             Admit to CIR   2.  Antithrombotics: -DVT/anticoagulation:  Pharmaceutical: Heparin             -antiplatelet therapy: none   3. Pain Management: Tylenol as needed   4. Mood/Behavior/Sleep: LCSW to evaluate and provide emotional support             -depression/anxiety: continue Buspar and Lexapro             -antipsychotic agents: n/a   5. Neuropsych/cognition: This patient not capable of making decisions on her own behalf.   6. Skin/Wound Care: Routine skin care checks   7. Fluids/Electrolytes/Nutrition: strict Is and Os; chemistries as per nephrology             -TF d/c ed             -continue Fosrenol, MVI, Aranesp   8: Hypertension: monitor TID and prn (home Lasix, losartan held>>other home meds restarted             -continue amlodipine 10 mg daily             -diltiazem 60 mg q 6 hours             -hydralazine 100 mg q 8 hours      02/07/2023    4:20 AM 02/07/2023    4:14 AM 02/07/2023    4:13 AM  Vitals with BMI  Weight 143 lbs 8 oz    BMI [id number]    Systolic  0000000 0000000  Diastolic  89 89  Pulse  95 95    9: Hyperlipidemia: home statin held   10: Hydrocephalus: resolved; EVD placed 2/09, removed 2/16. Discontinue scalp staple prior to discharge.    11: ESRD: HD on M/W/F via LUE AVF   12: DM-1; uncontolled: continue CBGs q 4 hours. Sensitive sliding scale changed to very sensitive given CBG of 32 on  admission -decrease Semglee to 12 units daily -reduce novalog to  1U TID novolog with meals as per  diabetic coordinator recommendations. Hold if po intake <50% -2/20: hypoglycemic events in early evening>>prn glucagon ordered if unable to take PO and provided Glucerna after supper 7-8 pm  CBG (last 3)  Recent Labs    02/07/23 0013 02/07/23 0412 02/07/23 0825  GLUCAP 117* 117* 185*     Used tresiba and novalog    13: Seizure disorder: home dose Keppra resumed             continue Keppra 500 mg BID             -gabapentin 100 mg TID   14: Hypothyroidism: continue Synthroid   15: Anemia, chronic disease: s/p 1 unit PCs, follow-up CBC  -hgb improved to 8.9 on 2/19  16: Right forearm edema: No DVT/SVT per RUE venous duplex. PIV removed.    LOS: 2 days A FACE TO FACE EVALUATION WAS PERFORMED  Charlett Blake 02/06/2023, 7:56 AM

## 2023-02-07 NOTE — Progress Notes (Addendum)
Upper extremity venous duplex has been completed.   Preliminary results in CV Proc.   Angela [name] 02/07/2023 9:01 AM

## 2023-02-07 NOTE — Progress Notes (Signed)
Right UE FA noted with some edema denies pain tenderness or discomfort continue to monitor

## 2023-02-07 NOTE — Progress Notes (Signed)
Patient's mother made a c/o about patient care after 7 am 02/06/19. Stated that she was not updated on patient's whereabouts or status. She also stated, "I have only seen a female nurse and the tech since daughter came back,". Charge nurse informed.

## 2023-02-07 NOTE — Progress Notes (Addendum)
Called to room by mother states patient had accidentally pulled out her IV, upon arrival patient sitting on BSC bloody drainage on clothing  IV NSL out with catheter intact, Pressure held for short period of time,no infiltration. IV team notified for restart

## 2023-02-07 NOTE — Progress Notes (Signed)
Physical Therapy Session Note  Patient Details  Name: Angela [name] MRN: [id number] Date of Birth: 1986/10/31  {CHL IP REHAB PT TIME CALCULATION:[id number]}  Short Term Goals: Week 1:  PT Short Term Goal 1 (Week 1): STG=LTG 2/2 ELOS  Skilled Therapeutic Interventions/Progress Updates:  Patient supine in bed on entrance to room. Patient alert and agreeable to PT session.   Patient with no pain complaint at start of session.  Therapeutic Activity: Bed Mobility: Pt performed supine <> sit with ***. VC/ tc required for ***. Transfers: Pt performed sit<>stand and stand pivot transfers throughout session with ***. Provided verbal cues for***.  Gait Training:  Pt ambulated *** ft using *** with ***. Demonstrated ***. Provided vc/ tc for ***.  Wheelchair Mobility:  Pt propelled wheelchair *** feet with ***. Provided vc for ***.  Neuromuscular Re-ed: NMR facilitated during session with focus on***. Pt guided in ***. NMR performed for improvements in motor control and coordination, balance, sequencing, judgement, and self confidence/ efficacy in performing all aspects of mobility at highest level of independence.   Therapeutic Exercise: Pt performed the following exercises with vc/ tc for proper technique. ***  Patient *** at end of session with brakes locked, *** alarm set, and all needs within reach.   Therapy Documentation Precautions:  Precautions Precautions: Fall, Other (comment) Precaution Comments: left side weakness, HD on MWF schedule, Restrictions Weight Bearing Restrictions: No General:   Vital Signs:  Pain:   Mobility:   Locomotion :    Trunk/Postural Assessment :    Balance:   Exercises:   Other Treatments:    Pt missed ***min of skilled therapy due to ***. Will re-attempt as schedule and pt availability permits.   Therapy/Group: Individual Therapy  Alger Simons PT, DPT, CSRS  02/07/2023, 9:28 AM

## 2023-02-07 NOTE — Progress Notes (Signed)
Speech Language Pathology Daily Session Note  Patient Details  Name: Angela [name] MRN: [id number] Date of Birth: 01-Aug-1986  Today's Date: 02/06/2023 SLP Individual Time: [id number]  SLP Missed Time: 15 min (fatigue)   Short Term Goals: Week 1: SLP Short Term Goal 1 (Week 1): Pt will consume therapeutic trials of thin liquids with minimal overt s/s of aspiration and min cues for use of swallowing precautions. SLP Short Term Goal 2 (Week 1): Pt will consume dys 3 textures and nectar thick liquids with minimal overt s/s of aspiration and min cues of use of swallowing precautions. SLP Short Term Goal 3 (Week 1): Pt will sustain her attention to basic tasks for ~5 minute intervals with min cues for redirection. SLP Short Term Goal 4 (Week 1): Pt will complete basic tasks with mod cues for functional problem solving. SLP Short Term Goal 5 (Week 1): Pt will utilize external aids with mod cues to recall daily information. SLP Short Term Goal 6 (Week 1): Pt will utilize compensatory strategies for naming  with mod assist during functional conversational tasks.  Skilled Therapeutic Interventions: Skilled ST treatment focused on cognitive goals. Pt was sleeping soundly on arrival, despite having just completed OT 15 minutes prior. Pt required extensive time to rouse and remained lethargic throughout encounter. SLP facilitated orientation with max A multimodal cues. Pt stating it's November and she's in the hospital for feeling "a little out of it." Pt had minimal awareness of reasoning for hospitalization. Pt required max A verbal redirection cues to maintain focus and arousal. Pt exhibited some language of confusion (discussing things pt felt were actively being played on TV, however TV wasn't on). Suspect lethargy contributed. Session was prematurely concluded 15 minutes early d/t significant fatigue impacting level of functional participation. Patient was left in bed with alarm activated and immediate  needs within reach at end of session. Continue per current plan of care.      Pain Pain Assessment Pain Scale: 0-10 Pain Score: 0-No pain  Therapy/Group: Individual Therapy  Patty Sermons 02/07/2023, 7:59 AM

## 2023-02-07 NOTE — IPOC Note (Signed)
Overall Plan of Care [facility]) Patient Details Name: Angela [name] MRN: [id number] DOB: 11-11-1986  Admitting Diagnosis: ICH (intracerebral hemorrhage) [facility])  Hospital Problems: Principal Problem:   ICH (intracerebral hemorrhage) ([place])     Functional Problem List: Nursing Safety, Bowel, Endurance, Pain  PT Balance, Safety, Endurance, Motor  OT Cognition, Endurance, Safety, Balance  SLP Cognition, Nutrition, Linguistic  TR         Basic ADL's: OT Bathing, Dressing, Toileting     Advanced  ADL's: OT Simple Meal Preparation, Light Housekeeping     Transfers: PT Bed Mobility, Bed to Chair, Car, [employer], Floor  OT Toilet, Metallurgist: PT Ambulation, Emergency planning/management officer, Stairs     Additional Impairments: OT    SLP Swallowing, Communication, Social Cognition expression Problem Solving, Memory, Attention, Awareness  TR      Anticipated Outcomes Item Anticipated Outcome  Self Feeding    Swallowing  supervision   Basic self-care  Mod I  Toileting  Mod I   Bathroom Transfers Mod I  Bowel/Bladder  manage bowel w mod I assist  Transfers  Mod I  Locomotion  Mod I w/ LRAD.  Communication  supervision  Cognition  min A  Pain  < 4 with prns  Safety/Judgment  manage w cues   Therapy Plan: PT Intensity: Minimum of 1-2 x/day ,45 to 90 minutes PT Frequency: 5 out of 7 days PT Duration Estimated Length of Stay: 7-10 days OT Intensity: Minimum of 1-2 x/day, 45 to 90 minutes OT Frequency: 5 out of 7 days OT Duration/Estimated Length of Stay: 7-10 days SLP Intensity: Minumum of 1-2 x/day, 30 to 90 minutes SLP Frequency: 3 to 5 out of 7 days SLP Duration/Estimated Length of Stay: 12-14 days   Team Interventions: Nursing Interventions Patient/Family Education, Pain Management, Medication Management, Discharge Planning, Bowel Management, Disease Management/Prevention  PT interventions Ambulation/gait training, Community reintegration, Neuromuscular  re-education, Stair training, UE/LE Strength taining/ROM, Wheelchair propulsion/positioning, Therapeutic Activities, UE/LE Coordination activities, Discharge planning, Training and development officer, Functional mobility training, Patient/family education, Therapeutic Exercise  OT Interventions Training and development officer, Cognitive remediation/compensation, Discharge planning, Disease mangement/prevention, DME/adaptive equipment instruction, Functional mobility training, Neuromuscular re-education, Patient/family education, Self Care/advanced ADL retraining, Therapeutic Activities, Therapeutic Exercise, UE/LE Strength taining/ROM  SLP Interventions Cognitive remediation/compensation, Cueing hierarchy, Dysphagia/aspiration precaution training, Patient/family education, Internal/external aids, Speech/Language facilitation  TR Interventions    SW/CM Interventions Discharge Planning, Psychosocial Support, Disease Management/Prevention, Patient/Family Education   Barriers to Discharge MD  Medical stability  Nursing Decreased caregiver support, Home environment access/layout 1 level 10 ste bil rail w mother/father;on disability; does do some cooking/cleaning; was driving to dialysis until seizures got so bad and had someone who was going with her; mother can't take her since her center is all the way in [facility]. trying to get to local center  PT Inaccessible home environment 10-12 STE  OT Hemodialysis, Medication compliance    SLP      SW Insurance for SNF coverage, Decreased caregiver support, Lack of/limited family support     Team Discharge Planning: Destination: PT-Home ,OT- Home , SLP-Home Projected Follow-up: PT-Home health PT, Outpatient PT, OT-  Outpatient OT, SLP-Home Health SLP Projected Equipment Needs: PT-To be determined, OT- To be determined, SLP-To be determined Equipment Details: PT- , OT-  Patient/family involved in discharge planning: PT- Patient,  OT-Patient, SLP-Patient  MD  ELOS: 10-12d Medical Rehab Prognosis:  Fair Assessment: The patient has been admitted for CIR therapies with the diagnosis of Left ICH. The team will be  addressing functional mobility, strength, stamina, balance, safety, adaptive techniques and equipment, self-care, bowel and bladder mgt, patient and caregiver education, type 1 DM. Goals have been set at Mod I/Sup. Anticipated discharge destination is Home.        See Team Conference Notes for weekly updates to the plan of care

## 2023-02-07 NOTE — Progress Notes (Signed)
Occupational Therapy Session Note  Patient Details  Name: Angela [name] MRN: [id number] Date of Birth: 26-May-1986  Today's Date: 02/07/2023 OT Individual Time: 1130-1202 1st Session, 1500-1528 2nd Session  OT Individual Time Calculation (min): 32 min, 28 min    Short Term Goals: Week 1:  OT Short Term Goal 1 (Week 1): STGs=LTGs due to patient's length of stay.  Skilled Therapeutic Interventions/Progress Updates:  1st Session:  Pt asleep in bed upon OT arrival foir short am session. With some encouragement was able to awaken and move to EOB with S. Cues for crossing leg up technique to don no slip socks over regular socks for falls prev with CGA. Sit to stand with RW to amb to sink side with CGA and cues for RW consistency. Stood for 4 minutes for oral care including rinse. Amb from sink side around to recliner with CGA and cues. Worked with OT on RO and use of memory book with max fading to mod cues for recall of RO and current/pertinent info. Left pt recliner level with chair alarm in place, needs and nurse call button in reach.    2nd Session:   OT arrived for short final session of the day and pt back in bed asleep. Agreeable to oob due to toileting needs. Pt impulsive and unable to follow safety precautions to allow OT to take bed alarm off and remove rails prior to trying to sit up and scoot between. Once EOB, OT assisted with CGA and cues for amb with RW to bathroom on regular toilet with grab bars. Pt able to have BM and void with CGA for weight shifts for hygiene (see flowsheets for data). Pt amb to sink side with CGA and stood for hand washing sink side with min cues. EOB for red tband tricep press 10 reps x 2 sets with max cues and demo. Pt able to remove slip on tennis shoes and move to supine with close S. Left pt bed level with bed exit active, needs and nurse call button in reach.   Therapy Documentation Precautions:  Precautions Precautions: Fall, Other (comment) Precaution  Comments: left side weakness, HD on MWF schedule, Restrictions Weight Bearing Restrictions: No    Therapy/Group: Individual Therapy  Barnabas Lister 02/07/2023, 7:49 AM

## 2023-02-07 NOTE — Progress Notes (Addendum)
Speech Language Pathology Daily Session Note  Patient Details  Name: Angela [name] MRN: [id number] Date of Birth: 20-Feb-1986  Today's Date: 02/07/2023 SLP Individual Time: 0900-0957 SLP Individual Time Calculation (min): 57 min  Short Term Goals: Week 1: SLP Short Term Goal 1 (Week 1): Pt will consume therapeutic trials of thin liquids with minimal overt s/s of aspiration and min cues for use of swallowing precautions. SLP Short Term Goal 2 (Week 1): Pt will consume dys 3 textures and nectar thick liquids with minimal overt s/s of aspiration and min cues of use of swallowing precautions. SLP Short Term Goal 3 (Week 1): Pt will sustain her attention to basic tasks for ~5 minute intervals with min cues for redirection. SLP Short Term Goal 4 (Week 1): Pt will complete basic tasks with mod cues for functional problem solving. SLP Short Term Goal 5 (Week 1): Pt will utilize external aids with mod cues to recall daily information. SLP Short Term Goal 6 (Week 1): Pt will utilize compensatory strategies for naming  with mod assist during functional conversational tasks.  Skilled Therapeutic Interventions: Skilled ST treatment focused on cognition and swallowing education. Upon arrival, pt was in bathroom and accompanied by nurse tech (NT). NT reported pt was ambulating to bathroom with bed alarm going off when she arrived. Pt passed off to SLP. Pt performed peri care and washed hands with supervision for balance. Ambulated to w/c using RW with CGA. SLP reinforced need to have staff present at all times for ambulation and reviewed uses for call bell. Pt verbalized understanding through teach back and demonstrated use of call bell. Pt was transported to speech therapy room for session to address orientation, memory compensations, and discuss recent events related to her hospitalization. Pt reported she has no memory of events leading to today and feels as though she just woke up. SLP also provided education  regarding dysphagia, current diet, and swallowing compensations. Pt had no awareness or recall of swallowing deficits. Pt declined interest in therapeutic PO trials this date. Pt required max A verbal cues to orient to time and situation; had general awareness of location using contextual clues. SLP provided education on uses of memory notebook and was provided with one for personal use. Placed sign outside door to encourage staff to remind pt to use notebook. Pt required mod A verbal reminder cues to recall date that she wrote at the top. Pt wrote a few questions she wanted addressed with sup A verbal cues for organization. Pt sustained attention to functional discussions, education, and memory compensations for duration of session with min A fading to sup A verbal redirection cues. Pt wanted to speak with her mom although wasn't sure if her cell phone was here in the hospital. SLP transported pt back to room in w/c and searched for phone with no success. SLP provided mod A cues for recall and dialing mom's number on hospital room phone. Pt eventually concluded task and wished to lie down. SLP wrote down mom's number in memory notebook and left on bedside table. Patient was left in bed with alarm activated and immediate needs within reach at end of session. Continue per current plan of care.      Pain  "My body just feels weird" but no specific discomfort noted   Therapy/Group: Individual Therapy  Patty Sermons 02/07/2023, 9:24 AM

## 2023-02-07 NOTE — Progress Notes (Signed)
[name] Progress Note   Subjective:    Seen and examined patient at bedside. Seen sitting up in recliner eating lunch. Reports breathing is "heavy" but not in distress and remains on RA. Tolerated yesterday's HD with net UF 2.4L. Denies CP and N/V. Next HD 02/08/23.  Objective Vitals:   02/06/23 2339 02/07/23 0413 02/07/23 0414 02/07/23 0420  BP: (!) 168/99 (!) 152/89 (!) 152/89   Pulse: 100 95 95   Resp: 17  18   Temp:  98.8 F (37.1 C) 98.8 F (37.1 C)   TempSrc:      SpO2:  98% 98%   Weight:    65.1 kg  Height:       Physical Exam General: Well appearing woman, NAD. Room air. Heart: RRR; no murmur Lungs: CTA anteriorly Abdomen: soft Extremities: no LE edema Dialysis Access: LUE AVF + bruit  Filed Weights   02/06/23 1504 02/06/23 2009 02/07/23 0420  Weight: 64.7 kg 65.4 kg 65.1 kg    Intake/Output Summary (Last 24 hours) at 02/07/2023 1340 Last data filed at 02/07/2023 1221 Gross per 24 hour  Intake 354 ml  Output 2402 ml  Net -2048 ml    Additional Objective Labs: Basic Metabolic Panel: Recent Labs  Lab 02/03/23 0426 02/04/23 0258 02/06/23 0610  NA 131* 132* 132*  K 4.7 5.0 4.7  CL 93* 93* 91*  CO2 21* 25 24  GLUCOSE 376* 153* 111*  BUN 45* 25* 40*  CREATININE 7.30* 4.58* 8.04*  CALCIUM 9.9 9.8 10.1  PHOS 6.5* 5.3* 6.3*   Liver Function Tests: Recent Labs  Lab 02/03/23 0426 02/04/23 0258 02/06/23 0610  ALBUMIN 2.8* 2.6* 3.0*   No results for input(s): "LIPASE", "AMYLASE" in the last 168 hours. CBC: Recent Labs  Lab 02/01/23 0601 02/04/23 0258 02/06/23 0610  WBC 11.7* 8.1 12.4*  HGB 8.6* 7.9* 8.9*  HCT 27.1* 25.3* 26.9*  MCV 91.9 91.0 89.7  PLT 167 140* 219   Blood Culture    Component Value Date/Time   SDES BLOOD BLOOD LEFT HAND 01/27/2023 1836   SPECREQUEST  01/27/2023 1836    BOTTLES DRAWN AEROBIC AND ANAEROBIC Blood Culture adequate volume   CULT  01/27/2023 1836    NO GROWTH 5 DAYS Performed at [facility], [place] [address] [place], [place] [address]    REPTSTATUS 02/01/2023 FINAL 01/27/2023 1836    Cardiac Enzymes: No results for input(s): "CKTOTAL", "CKMB", "CKMBINDEX", "TROPONINI" in the last 168 hours. CBG: Recent Labs  Lab 02/06/23 2259 02/07/23 0013 02/07/23 0412 02/07/23 0825 02/07/23 1121  GLUCAP 92 117* 117* 185* 280*   Iron Studies: No results for input(s): "IRON", "TIBC", "TRANSFERRIN", "FERRITIN" in the last 72 hours. Lab Results  Component Value Date   INR 0.96 09/21/2017   INR 1.07 08/25/2017   Studies/Results: VAS [place] UPPER EXTREMITY VENOUS DUPLEX  Result Date: 02/07/2023 UPPER VENOUS STUDY  Patient Name:  Angela [name]  Date of Exam:   02/07/2023 Medical Rec #: [id number]       Accession #:    [id number] Date of Birth: 04/17/1986       Patient Gender: F Patient Age:   37 years Exam Location:  [facility] Procedure:      VAS [place] UPPER EXTREMITY VENOUS DUPLEX Referring Phys: Risa Grill --------------------------------------------------------------------------------  Indications: Swelling Comparison Study: no prior Performing Technologist: Archie Patten RVS  Examination Guidelines: A complete evaluation includes B-mode imaging, spectral Doppler, color Doppler, and power Doppler as needed of all  accessible portions of each vessel. Bilateral testing is considered an integral part of a complete examination. Limited examinations for reoccurring indications may be performed as noted.  Right Findings: +----------+------------+---------+-----------+----------+-------------+ RIGHT     CompressiblePhasicitySpontaneousProperties   Summary    +----------+------------+---------+-----------+----------+-------------+ IJV           Full                                  rouleaux flow +----------+------------+---------+-----------+----------+-------------+ Subclavian    Full       Yes       Yes                             +----------+------------+---------+-----------+----------+-------------+ Axillary      Full       Yes       Yes                            +----------+------------+---------+-----------+----------+-------------+ Brachial      Full       Yes       Yes                            +----------+------------+---------+-----------+----------+-------------+ Radial        Full       Yes       Yes                            +----------+------------+---------+-----------+----------+-------------+ Ulnar         Full                                                +----------+------------+---------+-----------+----------+-------------+ Cephalic      Full                                                +----------+------------+---------+-----------+----------+-------------+ Basilic       Full                                                +----------+------------+---------+-----------+----------+-------------+  Left Findings: +----------+------------+---------+-----------+----------+-------+ LEFT      CompressiblePhasicitySpontaneousPropertiesSummary +----------+------------+---------+-----------+----------+-------+ Subclavian               Yes       Yes                      +----------+------------+---------+-----------+----------+-------+  Summary:  Right: No evidence of deep vein thrombosis in the upper extremity. No evidence of superficial vein thrombosis in the upper extremity. Rouleaux flow noted in the IJV.  *See table(s) above for measurements and observations.  Diagnosing physician: Deitra Mayo MD Electronically signed by Deitra Mayo MD on 02/07/2023 at 12:10:31 PM.    Final     Medications:   amLODipine  10 mg Oral Daily   busPIRone  15 mg Oral QHS   Chlorhexidine Gluconate Cloth  6 each Topical Daily   [START ON 02/10/2023] darbepoetin (ARANESP) injection - DIALYSIS  100 mcg Subcutaneous Q Fri-1800   diltiazem  60 mg Oral Q6H   escitalopram  20  mg Oral Daily   gabapentin  100 mg Oral TID   heparin injection (subcutaneous)  5,000 Units Subcutaneous Q8H   hydrALAZINE  100 mg Oral Q8H   insulin aspart  0-6 Units Subcutaneous TID WC   insulin aspart  1 Units Subcutaneous TID WC   insulin glargine-yfgn  12 Units Subcutaneous Daily   lanthanum  1,000 mg Oral TID with meals   levETIRAcetam  500 mg Oral BID   levothyroxine  175 mcg Oral q morning   multivitamin  1 tablet Oral QHS   pantoprazole (PROTONIX) IV  40 mg Intravenous QHS   senna-docusate  1 tablet Oral BID    Dialysis Orders: MWF [facility] [address] 3.5hr, EDW 62kg, 2K/2.5Ca bath, LUE AVF, Prev was getting Hep 2600 + 500 u/hr - now held  Assessment/Plan: 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 Slowly improving mentation/recovery. ESRD: Continue HD on MWF schedule - next HD 2/21, no added heparin. 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 Phos improving, continue lanthanum as binder, noted some missed doses over past 3 days. Acute hypoxic respiratory failure: Secondary to altered mental status in the setting of intracranial bleed.  Resolved/extubated and remains on room air without supplemental oxygen. Hypertension: BP variable, now on amlodipine + diltiazem. Dispo: Transferred to CIR on 2/17.  Tobie Poet, NP [facility] 02/07/2023,1:40 PM  LOS: 3 days

## 2023-02-07 NOTE — Progress Notes (Signed)
Physical Therapy Session Note  Patient Details  Name: Angela [name] MRN: [id number] Date of Birth: [birth year]  Today's Date: 02/07/2023 PT Individual Time: 1030-1040 PT Individual Time Calculation (min): 10 min   Short Term Goals: Week 1:  PT Short Term Goal 1 (Week 1): STG=LTG 2/2 ELOS  Skilled Therapeutic Interventions/Progress Updates:    Pt received side lying in bed w/ Physician present upon entrance assessing pt. Pt lethargic and denied PT services. Therapist educated pt on the importance of getting out of bed and moving in order to improve pt's functionality, however, pt declined to participate.   Pt missed 35 min of skilled therapy due to lethargy and fatigue. Will re-attempt as schedule and pt availability permits.  Therapy Documentation Precautions:  Precautions Precautions: Fall, Other (comment) Precaution Comments: left side weakness, HD on MWF schedule, Restrictions Weight Bearing Restrictions: No General: PT Amount of Missed Time (min): 30 Minutes PT Missed Treatment Reason: Patient fatigue;Patient unwilling to participate  Pain: Pain Assessment Pain Scale: 0-10 Pain Score: 0-No pain       Therapy/Group: Individual Therapy  Angela [name] 02/07/2023, 1:33 PM

## 2023-02-08 DIAGNOSIS — I610 Nontraumatic intracerebral hemorrhage in hemisphere, subcortical: Secondary | ICD-10-CM | POA: Diagnosis not present

## 2023-02-08 LAB — GLUCOSE, CAPILLARY
Glucose-Capillary: 132 mg/dL — ABNORMAL HIGH (ref 70–99)
Glucose-Capillary: 135 mg/dL — ABNORMAL HIGH (ref 70–99)
Glucose-Capillary: 140 mg/dL — ABNORMAL HIGH (ref 70–99)
Glucose-Capillary: 161 mg/dL — ABNORMAL HIGH (ref 70–99)
Glucose-Capillary: 278 mg/dL — ABNORMAL HIGH (ref 70–99)
Glucose-Capillary: 40 mg/dL — CL (ref 70–99)
Glucose-Capillary: 79 mg/dL (ref 70–99)

## 2023-02-08 LAB — CBC
HCT: 26.9 % — ABNORMAL LOW (ref 36.0–46.0)
Hemoglobin: 8.7 g/dL — ABNORMAL LOW (ref 12.0–15.0)
MCH: 29.9 pg (ref 26.0–34.0)
MCHC: 32.3 g/dL (ref 30.0–36.0)
MCV: 92.4 fL (ref 80.0–100.0)
Platelets: 180 10*3/uL (ref 150–400)
RBC: 2.91 MIL/uL — ABNORMAL LOW (ref 3.87–5.11)
RDW: 14.5 % (ref 11.5–15.5)
WBC: 10.7 10*3/uL — ABNORMAL HIGH (ref 4.0–10.5)
nRBC: 0.5 % — ABNORMAL HIGH (ref 0.0–0.2)

## 2023-02-08 MED ORDER — GLUCOSE 40 % PO GEL
2.0000 | ORAL | Status: AC
  Administered 2023-02-08: 62 g via ORAL
  Filled 2023-02-08 (×2): qty 2.42

## 2023-02-08 MED ORDER — AMLODIPINE BESYLATE 10 MG PO TABS
10.0000 mg | ORAL_TABLET | Freq: Every day | ORAL | Status: DC
  Administered 2023-02-09 – 2023-02-14 (×6): 10 mg via ORAL
  Filled 2023-02-08 (×6): qty 1

## 2023-02-08 NOTE — Progress Notes (Signed)
Physical Therapy Session Note  Patient Details  Name: Ritamae Hyder MRN: [id number] Date of Birth: [birth year]  Today's Date: 02/08/2023 PT Individual Time: 0918-1000 PT Individual Time Calculation (min): 42 min   Short Term Goals: Week 1:  PT Short Term Goal 1 (Week 1): STG=LTG 2/2 ELOS   Skilled Therapeutic Interventions/Progress Updates:  Patient prone in bed and asleep on entrance to room. Patient requires time and excessive encouragement to become alert and agreeable to PT session.    Patient with no pain complaint at start of session.  Therapeutic Activity: Bed Mobility: Pt performed supine --> sit with supervision/ Mod I. Cueing only required to encourage effort. Sitting balance good with pt performing stretches prior to standing up.   At end of session, pt climbs forward into bed and lies prone with head turned away from therapist.  Transfers: Pt performed sit<>stand and stand pivot transfers throughout session with supervision/ CGA for RLE advancement. Provided verbal cues for increased focus to RLE movement.  Gait Training:  Pt ambulated 160' x1/ 210' x1 using no AD with CGA/ MinA for balance and crossover stepping. Requires standing rest break during longer amb bout. Pt demos reduced attn to RLE and consistently reaches out for hand hold to HR or other larger obstacles in hallway d/t lethargy and balance despite cueing to not reach out for UE support. Demonstrated crossover stepping with RLE, decreased DF requiring increased hip/ knee flexion to prevent catch of toe. Provided vc/ tc throughout for increased focus to RLE and increasing step height as well as for upright posture and level gaze.   Neuromuscular Re-ed: NMR facilitated during session with focus on standing balance, motor control. Pt guided in dynamic stepping task to color discs placed in arc in front of pt. Pt instructed to step R foot to toe touch to color disc. Pt requires consistent cueing throughout for  increased step length to reach color disc as she consistently steps short and with no controlled knee flexion.   Guided in R then L forward lunges to improve motor control during dynamic step forward as well as maintaining balance over stance leg. Guided in forward lean into lunge and coordinated spring back into upright stance while maintaining balance. Good performance but requires vc throughout for effort. Progressed to using lunge to toss bean bags to target board placed 12 ft away from pt. Pt performs with RLE step forward and toss with R hand. Maintains balance throughout except for 2 small wobbles and pt cross stepping to maintain balance. Corrects with stepping strategy and hip strategy requiring MinA to maintain.  NMR performed for improvements in motor control and coordination, balance, sequencing, judgement, and self confidence/ efficacy in performing all aspects of mobility at highest level of independence.   Patient prone at end of session with brakes locked, bed alarm set, and all needs within reach.   Therapy Documentation Precautions:  Precautions Precautions: Fall, Other (comment) Precaution Comments: left side weakness, HD on MWF schedule, Restrictions Weight Bearing Restrictions: No General:   Vital Signs: Therapy Vitals Temp: 98 F (36.7 C) Temp Source: Oral Pulse Rate: 90 Resp: 18 BP: (!) 149/83 Patient Position (if appropriate): Lying Oxygen Therapy SpO2: 98 % O2 Device: Room Air Pain: Pain Assessment Pain Scale: 0-10 Pain Score: 0-No pain  Therapy/Group: Individual Therapy  Alger Simons PT, DPT, CSRS 02/08/2023, 10:05 AM

## 2023-02-08 NOTE — Progress Notes (Signed)
PROGRESS NOTE   Subjective/Complaints:  Pt recalls OT session that she just completed, tripped on a cone and touched Right knee to ground per OT, pt still "feels it a little " No bleeding   OT notes drowsiness in am , ? Home sleep schedule   ROS: denies shortness of breath  Objective:   VAS [place] UPPER EXTREMITY VENOUS DUPLEX  Result Date: 02/07/2023 UPPER VENOUS STUDY  Patient Name:  Angela [name]  Date of Exam:   02/07/2023 Medical Rec #: [id number]       Accession #:    [id number] Date of Birth: 13-Apr-1986       Patient Gender: F Patient Age:   37 years Exam Location:  [facility] Procedure:      VAS [place] UPPER EXTREMITY VENOUS DUPLEX Referring Phys: Risa Grill --------------------------------------------------------------------------------  Indications: Swelling Comparison Study: no prior Performing Technologist: Archie Patten RVS  Examination Guidelines: A complete evaluation includes B-mode imaging, spectral Doppler, color Doppler, and power Doppler as needed of all accessible portions of each vessel. Bilateral testing is considered an integral part of a complete examination. Limited examinations for reoccurring indications may be performed as noted.  Right Findings: +----------+------------+---------+-----------+----------+-------------+ RIGHT     CompressiblePhasicitySpontaneousProperties   Summary    +----------+------------+---------+-----------+----------+-------------+ IJV           Full                                  rouleaux flow +----------+------------+---------+-----------+----------+-------------+ Subclavian    Full       Yes       Yes                            +----------+------------+---------+-----------+----------+-------------+ Axillary      Full       Yes       Yes                            +----------+------------+---------+-----------+----------+-------------+ Brachial      Full        Yes       Yes                            +----------+------------+---------+-----------+----------+-------------+ Radial        Full       Yes       Yes                            +----------+------------+---------+-----------+----------+-------------+ Ulnar         Full                                                +----------+------------+---------+-----------+----------+-------------+ Cephalic      Full                                                +----------+------------+---------+-----------+----------+-------------+  Basilic       Full                                                +----------+------------+---------+-----------+----------+-------------+  Left Findings: +----------+------------+---------+-----------+----------+-------+ LEFT      CompressiblePhasicitySpontaneousPropertiesSummary +----------+------------+---------+-----------+----------+-------+ Subclavian               Yes       Yes                      +----------+------------+---------+-----------+----------+-------+  Summary:  Right: No evidence of deep vein thrombosis in the upper extremity. No evidence of superficial vein thrombosis in the upper extremity. Rouleaux flow noted in the IJV.  *See table(s) above for measurements and observations.  Diagnosing physician: Deitra Mayo MD Electronically signed by Deitra Mayo MD on 02/07/2023 at 12:10:31 PM.    Final    Recent Labs    02/06/23 0610  WBC 12.4*  HGB 8.9*  HCT 26.9*  PLT 219    Recent Labs    02/06/23 0610  NA 132*  K 4.7  CL 91*  CO2 24  GLUCOSE 111*  BUN 40*  CREATININE 8.04*  CALCIUM 10.1     Intake/Output Summary (Last 24 hours) at 02/08/2023 0842 Last data filed at 02/08/2023 0735 Gross per 24 hour  Intake 472 ml  Output --  Net 472 ml         Physical Exam: Vital Signs Blood pressure (!) 147/71, pulse 91, temperature 98.1 F (36.7 C), resp. rate 18, height 5' 4.96" (1.65 m),  weight 65.3 kg, SpO2 95 %.   General: No acute distress Mood and affect are appropriate Heart: Regular rate and rhythm no rubs murmurs or extra sounds Lungs: Clear to auscultation, breathing unlabored, no rales or wheezes Abdomen: Positive bowel sounds, soft nontender to palpation, nondistended Extremities: No clubbing, cyanosis, or edema MSK:  RIght knee without abrasion or ecchymosis , no pain to palp or with ROM   Skin:LUE AVF Neuro: 4/5 throughout, sensation is intact. Can follow commands. Impaired safety awareness. Can recall 2/3 objects. Hypophonic. Sensation intact   Assessment/Plan: 1. Functional deficits which require 3+ hours per day of interdisciplinary therapy in a comprehensive inpatient rehab setting. Physiatrist is providing close team supervision and 24 hour management of active medical problems listed below. Physiatrist and rehab team continue to assess barriers to discharge/monitor patient progress toward functional and medical goals  Care Tool:  Bathing    Body parts bathed by patient: Right arm, Left arm, Chest, Abdomen, Front perineal area, Buttocks, Right upper leg, Left upper leg, Right lower leg, Left lower leg, Face         Bathing assist Assist Level: Set up assist     Upper Body Dressing/Undressing Upper body dressing   What is the patient wearing?: Pull over shirt    Upper body assist Assist Level: Set up assist    Lower Body Dressing/Undressing Lower body dressing      What is the patient wearing?: Underwear/pull up     Lower body assist Assist for lower body dressing: Set up assist     Toileting Toileting Toileting Activity did not occur (Clothing management and hygiene only): N/A (no void or bm)  Toileting assist Assist for toileting: Contact Guard/Touching assist     Transfers Chair/bed transfer  Transfers assist  Chair/bed transfer assist level: Contact Guard/Touching assist     Locomotion Ambulation   Ambulation  assist      Assist level: Minimal Assistance - Patient > 75% Assistive device: Other (comment) (at gait belt and L shoulder) Max distance: 161   Walk 10 feet activity   Assist     Assist level: Minimal Assistance - Patient > 75% Assistive device: Other (comment) (at gait belt and L shoulder)   Walk 50 feet activity   Assist    Assist level: Minimal Assistance - Patient > 75% Assistive device: Other (comment) (at gait belt and L shoulder)    Walk 150 feet activity   Assist    Assist level: Minimal Assistance - Patient > 75% Assistive device: Other (comment) (at gait belt and L shoulder)    Walk 10 feet on uneven surface  activity   Assist     Assist level: Minimal Assistance - Patient > 75% (ramp) Assistive device: Other (comment) (at gait belt and L shoulder and does reach for external support if available.)   Wheelchair     Assist Is the patient using a wheelchair?: Yes Type of Wheelchair: Manual    Wheelchair assist level: Supervision/Verbal cueing Max wheelchair distance: 100    Wheelchair 50 feet with 2 turns activity    Assist    Wheelchair 50 feet with 2 turns activity did not occur:  (per PT documentation)   Assist Level: Supervision/Verbal cueing   Wheelchair 150 feet activity     Assist      Assist Level: Moderate Assistance - Patient 50 - 74% (per PT documentation of 100 feet max distance)   Blood pressure (!) 147/71, pulse 91, temperature 98.1 F (36.7 C), resp. rate 18, height 5' 4.96" (1.65 m), weight 65.3 kg, SpO2 95 %.  Medical Problem List and Plan: 1. Functional deficits secondary to [place] left caudate with resolving IVH - uncontrolled              -patient may shower             -ELOS/Goals: 7-10 days S             Team conference today please see physician documentation under team conference tab, met with team  to discuss problems,progress, and goals. Formulized individual treatment plan based on medical history,  underlying problem and comorbidities.    2.  Antithrombotics: -DVT/anticoagulation:  Pharmaceutical: Heparin             -antiplatelet therapy: none   3. Pain Management: Tylenol as needed   4. Mood/Behavior/Sleep: LCSW to evaluate and provide emotional support             -depression/anxiety: continue Buspar and Lexapro             -antipsychotic agents: n/a   5. Neuropsych/cognition: This patient not capable of making decisions on her own behalf.   6. Skin/Wound Care: Routine skin care checks   7. Fluids/Electrolytes/Nutrition: strict Is and Os; chemistries as per nephrology             -TF d/c ed             -continue Fosrenol, MVI, Aranesp   8: Hypertension: monitor TID and prn (home Lasix, losartan held>>other home meds restarted             -continue amlodipine 10 mg daily             -diltiazem 60 mg TID             -  hydralazine 100 mg TID     Vitals:   02/07/23 1949 02/08/23 0424  BP: (!) 152/86 (!) 147/71  Pulse: 94 91  Resp: 18 18  Temp: 98 F (36.7 C) 98.1 F (36.7 C)  SpO2: 99% 95%    9: Hyperlipidemia: home statin held   10: Hydrocephalus: resolved; EVD placed 2/09, removed 2/16   11: ESRD: HD on M/W/F via LUE AVF   12: DM-1; uncontolled: continue CBGs q 4 hours. Sensitive sliding scale changed to very sensitive given CBG of 32 on admission -decrease Semglee to 12 units daily -reduce novalog to  1U TID novolog with meals as per diabetic coordinator recommendations. Hold if po intake <50%  CBG (last 3)  Recent Labs    02/08/23 0019 02/08/23 0422 02/08/23 0829  GLUCAP 140* 135* 278*   All values <300, cont current management, pt feels diet is close to home  Used tresiba and novalog, now on Semglee improved     13: Seizure disorder: home dose Keppra resumed             continue Keppra 500 mg BID             -gabapentin 100 mg TID  may be contributing to drowsiness 14: Hypothyroidism: continue Synthroid   15: Anemia, chronic disease: s/p 1  unit PCs, follow-up CBC    LOS: 4 days A FACE TO FACE EVALUATION WAS PERFORMED  Charlett Blake 02/08/2023, 8:42 AM

## 2023-02-08 NOTE — Progress Notes (Signed)
[facility] Progress Note   Subjective:    Seen and examined patient at bedside. Informed by bedside RN of patient falling during her scheduled OT session earlier today. Reviewed Dr. Dianna Limbo note: apparently she tripped on a cone and right knee touched the ground (more like a mechanical fall). Seen patient in bed. She appears drowsy but responds to questions. Plan for HD today.  Objective Vitals:   02/08/23 0850 02/08/23 0900 02/08/23 1102 02/08/23 1255  BP: (!) 149/83 (!) 149/83 (!) 162/82 (!) 153/85  Pulse: 90 90 92 92  Resp: 18  18 18  $ Temp: 98 F (36.7 C)  98.1 F (36.7 C) 98.1 F (36.7 C)  TempSrc: Oral     SpO2: 98%  100% 100%  Weight:      Height:       Physical Exam General: Well appearing woman, NAD. Room air. Heart: RRR; no murmur Lungs: CTA anteriorly Abdomen: soft Extremities: no LE edema Dialysis Access: LUE AVF + bruit  Filed Weights   02/06/23 2009 02/07/23 0420 02/08/23 0424  Weight: 65.4 kg 65.1 kg 65.3 kg    Intake/Output Summary (Last 24 hours) at 02/08/2023 1413 Last data filed at 02/08/2023 1100 Gross per 24 hour  Intake 354 ml  Output --  Net 354 ml    Additional Objective Labs: Basic Metabolic Panel: Recent Labs  Lab 02/03/23 0426 02/04/23 0258 02/06/23 0610  NA 131* 132* 132*  K 4.7 5.0 4.7  CL 93* 93* 91*  CO2 21* 25 24  GLUCOSE 376* 153* 111*  BUN 45* 25* 40*  CREATININE 7.30* 4.58* 8.04*  CALCIUM 9.9 9.8 10.1  PHOS 6.5* 5.3* 6.3*   Liver Function Tests: Recent Labs  Lab 02/03/23 0426 02/04/23 0258 02/06/23 0610  ALBUMIN 2.8* 2.6* 3.0*   No results for input(s): "LIPASE", "AMYLASE" in the last 168 hours. CBC: Recent Labs  Lab 02/04/23 0258 02/06/23 0610  WBC 8.1 12.4*  HGB 7.9* 8.9*  HCT 25.3* 26.9*  MCV 91.0 89.7  PLT 140* 219   Blood Culture    Component Value Date/Time   SDES BLOOD BLOOD LEFT HAND 01/27/2023 1836   SPECREQUEST  01/27/2023 1836    BOTTLES DRAWN AEROBIC AND ANAEROBIC  Blood Culture adequate volume   CULT  01/27/2023 1836    NO GROWTH 5 DAYS Performed at [facility], [place] [address] [place], [place] [address]    REPTSTATUS 02/01/2023 FINAL 01/27/2023 1836    Cardiac Enzymes: No results for input(s): "CKTOTAL", "CKMB", "CKMBINDEX", "TROPONINI" in the last 168 hours. CBG: Recent Labs  Lab 02/07/23 2007 02/08/23 0019 02/08/23 0422 02/08/23 0829 02/08/23 1144  GLUCAP 210* 140* 135* 278* 161*   Iron Studies: No results for input(s): "IRON", "TIBC", "TRANSFERRIN", "FERRITIN" in the last 72 hours. Lab Results  Component Value Date   INR 0.96 09/21/2017   INR 1.07 08/25/2017   Studies/Results: VAS [place] UPPER EXTREMITY VENOUS DUPLEX  Result Date: 02/07/2023 UPPER VENOUS STUDY  Patient Name:  Angela [name]  Date of Exam:   02/07/2023 Medical Rec #: [id number]       Accession #:    [id number] Date of Birth: Apr 23, 1986       Patient Gender: F Patient Age:   37 years Exam Location:  [facility] Procedure:      VAS [place] UPPER EXTREMITY VENOUS DUPLEX Referring Phys: Risa Grill --------------------------------------------------------------------------------  Indications: Swelling Comparison Study: no prior Performing Technologist: Archie Patten RVS  Examination Guidelines: A complete evaluation includes B-mode  imaging, spectral Doppler, color Doppler, and power Doppler as needed of all accessible portions of each vessel. Bilateral testing is considered an integral part of a complete examination. Limited examinations for reoccurring indications may be performed as noted.  Right Findings: +----------+------------+---------+-----------+----------+-------------+ RIGHT     CompressiblePhasicitySpontaneousProperties   Summary    +----------+------------+---------+-----------+----------+-------------+ IJV           Full                                  rouleaux flow +----------+------------+---------+-----------+----------+-------------+  Subclavian    Full       Yes       Yes                            +----------+------------+---------+-----------+----------+-------------+ Axillary      Full       Yes       Yes                            +----------+------------+---------+-----------+----------+-------------+ Brachial      Full       Yes       Yes                            +----------+------------+---------+-----------+----------+-------------+ Radial        Full       Yes       Yes                            +----------+------------+---------+-----------+----------+-------------+ Ulnar         Full                                                +----------+------------+---------+-----------+----------+-------------+ Cephalic      Full                                                +----------+------------+---------+-----------+----------+-------------+ Basilic       Full                                                +----------+------------+---------+-----------+----------+-------------+  Left Findings: +----------+------------+---------+-----------+----------+-------+ LEFT      CompressiblePhasicitySpontaneousPropertiesSummary +----------+------------+---------+-----------+----------+-------+ Subclavian               Yes       Yes                      +----------+------------+---------+-----------+----------+-------+  Summary:  Right: No evidence of deep vein thrombosis in the upper extremity. No evidence of superficial vein thrombosis in the upper extremity. Rouleaux flow noted in the IJV.  *See table(s) above for measurements and observations.  Diagnosing physician: Deitra Mayo MD Electronically signed by Deitra Mayo MD on 02/07/2023 at 12:10:31 PM.    Final     Medications:   [START ON 02/09/2023] amLODipine  10 mg Oral  QHS   busPIRone  15 mg Oral QHS   Chlorhexidine Gluconate Cloth  6 each Topical Daily   [START ON 02/10/2023] darbepoetin (ARANESP) injection  - DIALYSIS  100 mcg Subcutaneous Q Fri-1800   diltiazem  60 mg Oral Q6H   escitalopram  20 mg Oral Daily   gabapentin  100 mg Oral TID   heparin injection (subcutaneous)  5,000 Units Subcutaneous Q8H   hydrALAZINE  100 mg Oral Q8H   insulin aspart  0-6 Units Subcutaneous TID WC   insulin aspart  1 Units Subcutaneous TID WC   insulin glargine-yfgn  12 Units Subcutaneous Daily   lanthanum  1,000 mg Oral TID with meals   levETIRAcetam  500 mg Oral BID   levothyroxine  175 mcg Oral q morning   multivitamin  1 tablet Oral QHS   pantoprazole  40 mg Oral Daily   senna-docusate  1 tablet Oral BID    Dialysis Orders: MWF [facility] [address] 3.5hr, EDW 62kg, 2K/2.5Ca bath, LUE AVF, Prev was getting Hep 2600 + 500 u/hr - now held  Assessment/Plan: 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 Slowly improving mentation/recovery. ESRD: Continue HD on MWF schedule - next HD this afternoon, no added heparin. 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 Phos improving, continue lanthanum as binder, noted some missed doses over past 3 days. Acute hypoxic respiratory failure: Secondary to altered mental status in the setting of intracranial bleed.  Resolved/extubated and remains on room air without supplemental oxygen. Hypertension: BP variable, now on amlodipine + diltiazem. Dispo: Transferred to CIR on 2/17.  Tobie Poet, NP [facility] 02/08/2023,2:13 PM  LOS: 4 days

## 2023-02-08 NOTE — Progress Notes (Signed)
LATE ENTRY NOTE :  Patient returned to the unit from dialysis about 9:18 pm. VS checked including CBG. SBP in 180's and CBG was 55. Family offered to feed patient her dinner plate with other nectar thick liquids which was by patient bedside. Mother said patient couldn't have orange nectar thick because of client potasium levels ( which apparently was within therapeutic range). Apple sauce offered with due medications. Capillary glucose values began to increase, and elevated BP addressed (intervention) with good effect. Follow up VS were satisfactory. Patient made comfortable in bed. I apologized to clients mother who had concerns about patient care, and assured her that her daughter will be adequately cared for. Patient Nurse updated who later followed up with client and family.

## 2023-02-08 NOTE — Progress Notes (Signed)
Patient ID: Angela [name], female   DOB: [birth year], 37 y.o.   MRN: [id number] Team Conference Report to Patient/Family  Team Conference discussion was reviewed with the patient and caregiver, including goals, any changes in plan of care and target discharge date.  Patient and caregiver express understanding and are in agreement.  The patient has a target discharge date of 02/14/23.  Sw spoke with patient mother, [name] and provided team conference updates. Mother requesting to have pt's HD location changed for d/c. Mother requesting [place] location. No additional questions or concerns.   Patient asleep and sw will FU to provide updates.   Dyanne Iha 02/08/2023, 3:12 PM

## 2023-02-08 NOTE — Progress Notes (Signed)
Patient ID: Angela [name], female   DOB: [birth year], 37 y.o.   MRN: [id number] Follow up with patient and mother on DM management, concerns for conference. Noted patient is "sleepy" more so than normal and C/O headaches not relieved by Tylenol. Also reviewed supplement snack at 2000 daily as CBGs have been running low at HS. Glucerna ordered at 2000; if hypoglycemia persists, Dietician recommended Ensure Enlive or Plus with more carbs.  Mom reports patient "ate" all of the time at home as she thought that was needed", no specific snack at bedtime PTA. Continue to follow along to address educational needs. Margarito Liner

## 2023-02-08 NOTE — Progress Notes (Signed)
On call provider Reesa Chew PA, notified of hypoglycemic event.  Last CBG 132 at 2230, nepro provided to patient per on call provider.

## 2023-02-08 NOTE — Significant Event (Signed)
   02/08/23 0850  Charting Type  Charting Type Shift assessment  Neurological  Neuro (WDL) X  Orientation Level Oriented to person;Oriented to place;Oriented to time;Oriented to situation  Cognition Follows commands  Speech Clear  Motor Function/Sensation Assessment Grip;Motor response  R Hand Grip Moderate  L Hand Grip Moderate  RUE Motor Response Purposeful movement  LUE Motor Response Purposeful movement  RLE Motor Response Purposeful movement  LLE Motor Response Purposeful movement  CareTool - Signs and Symptoms of Delirium (from CAM)  Is there evidence of an acute change in mental status from the patient's baseline? 0 No  Inattention 0 Behavior not present  Disorganized thinking 0 Behavior not present  Altered level of consciousness  0 Behavior not present  Positive CAM assessment intervention/preventative measures Universal precautions (preventative) measures initiated;Utilize bed alarms  HEENT  HEENT (WDL) X  Lips Symmetrical  Teeth Intact  Tongue Pink;Moist  Mucous Membrane(s) Pink;Moist  Voice Clear  Respiratory  Respiratory (WDL) WDL  Respiratory Pattern Regular  Chest Assessment Chest expansion symmetrical  Bilateral Breath Sounds Clear  Cardiac  Cardiac (WDL) WDL  Pulse Regular  Heart Sounds S1, S2  Jugular Venous Distention (JVD) No  ECG Monitor No  Vascular  Vascular (WDL) X  Pulses L dorsalis pedis;R dorsalis pedis;L radial;R radial  RUE Neurovascular Assessment  R Radial Pulse +2  LUE Neurovascular Assessment  L Radial Pulse +2  RLE Neurovascular Assessment  R Dorsalis Pedis Pulse +2  LLE Neurovascular Assessment  L Dorsalis Pedis Pulse +2  Fistula / Graft Left Upper arm  No placement date or time found.   Placed prior to admission: Yes  Orientation: Left  Access Location: Upper arm  Site Condition No complications  Fistula / Graft Assessment  (Dressing place.)  Status  (Dressing place.)  Drainage Description None  Integumentary   Integumentary (WDL) X  Skin Color Appropriate for ethnicity  Skin Condition Dry  Skin Integrity Ecchymosis  Ecchymosis Location Abdomen  Ecchymosis Location Orientation Bilateral  Skin Turgor Non-tenting  Braden Scale (Ages 8 and up)  Sensory Perceptions 4  Moisture 4  Activity 3  Mobility 3  Nutrition 2  Friction and Shear 3  Braden Scale Score 19  Braden Interventions  Braden Scale Interventions Reposition q2h  Musculoskeletal  Musculoskeletal (WDL) X  Assistive Device Front wheel walker  Generalized Weakness Yes  Weight Bearing Restrictions No  Gastrointestinal  Gastrointestinal (WDL) X  Abdomen Inspection Soft  Bowel Sounds Assessment Active  Tenderness Nontender  Last BM Date  02/07/23  GU Assessment  Genitourinary (WDL) X  Genitourinary Symptoms Oliguria  Genitalia  Female Genitalia Intact  Psychosocial  Psychosocial (WDL) WDL  Incision (Closed) 01/27/23 Head Right;Upper  Date First Assessed: 01/27/23   Location: Head  Location Orientation: Right;Upper  Dressing Type None  Site / Wound Assessment Clean;Dry  Margins Attached edges (approximated)  Closure Staples;Surface sutures  Drainage Amount None  Treatment  (assesed)  Neurological  Level of Consciousness Alert

## 2023-02-08 NOTE — Significant Event (Signed)
Hypoglycemic Event  CBG: 40  Treatment: 2 tubes glucose gel. Pt family assisted in feeding patient, ate 25% of meal.   Symptoms: a bit drowsy/exhausted but awake when conversing or eating/drinking.   Follow-up CBG: Time:2127 CBG Result:79  Possible Reasons for Event: Inadequate meal intake Patient left for dialysis around 1500 and returned 2045 Comments/MD notified: Patient with no complaints at this time.     Angela [name] Angela [name]

## 2023-02-08 NOTE — Progress Notes (Signed)
Patient ID: Angela [name], female   DOB: February 13, 1986, 37 y.o.   MRN: [id number]  TTB ordered through [employer] .

## 2023-02-08 NOTE — Progress Notes (Signed)
SLP Cancellation Note  Patient Details Name: Angela [name] MRN: [id number] DOB: 01/02/1986   Cancelled treatment:   SLP attempted to see pt for skilled ST intervention; however, py lying in bed with covers over her head and not engaging with therapist despite multiple attempts and repositioning. Offered choices for goals to address with no change in pt's willingness to participate. Additionally, providing information re: rehab admission. Will attempt to make up time as able - pt missed 30 minutes of skilled ST intervention.   Xela Oregel A Ruthy Forry 02/08/2023, 11:40 AM

## 2023-02-08 NOTE — Patient Care Conference (Cosign Needed Addendum)
Inpatient RehabilitationTeam Conference and Plan of Care Update Date: 02/08/2023   Time: 10:20 AM    Patient Name: Angela [name]      Medical Record Number: [id number]  Date of Birth: [birth year] Sex: Female         Room/Bed: 4W22C/4W22C-01 Payor Info: Payor: MEDICAID [place] / Plan: MEDICAID [place] ACCESS / Product Type: *No Product type* /    Admit Date/Time:  02/04/2023  3:40 PM  Primary Diagnosis:  ICH (intracerebral hemorrhage) [facility])  Hospital Problems: Principal Problem:   ICH (intracerebral hemorrhage) [facility])    Expected Discharge Date: Expected Discharge Date: 02/14/23  Team Members Present: Physician leading conference: Dr. Alysia Penna Social Worker Present: Erlene Quan, BSW Nurse Present: Dorien Chihuahua, RN PT Present: Barrie Folk, PT OT Present: Meriel Pica, OT SLP Present: Weston Anna, SLP     Current Status/Progress Goal Weekly Team Focus  Bowel/Bladder   continent of B/B   Remain continent   Assist with toielting as needed.    Swallow/Nutrition/ Hydration   dys 3/NTL, sup A   sup A  education, tolerance of current diet, therapetic PO trials with thin liquids, diet advancement as clinically appropropriate    ADL's                Mobility   consistent fatigue and lethargy, decrease willingness to participate. Bed Mobility: supervion A. Transfers: sit to stand CGA/min A w/ impulsivity present, stand pivot Min A for motor control. Gait: ~90 ft w/ no AD w/ Min A for safety awarness, coordination, and motor control.   Independent w/ bed mobiltiy, Mod I w/ transfers and gait  Increasing activity tolerance and participation, motor coordniation, dynamic balance, gait, stairs, safety awareness, family/caregiver education    Communication   sup A - minimal word finding difficulty noted, suspect varies with fatigue   sup A   functional expression, word finding strategies as indicated    Safety/Cognition/ Behavioral Observations  max A - pt  limited by lethargy. Presenting with severe memory impairments impacting orientation, intellectual awareness of deficits, and carry over of education. Pt with almost no memory of events related to hospitalization or events occuring earlier in the day   min A   intellectual awareness, attention, memory with use of compensations (initiated memory notebook), problem solving    Pain   No c/o pain at this time   Pain <3/10   Assess Qshift and prn    Skin   LUE fistula, Remainder of skin intact   Maintain skin integrity  Assess Qshift and prn      Discharge Planning:  Discharging back home with mother able to assist sup/light Min A. 24/7. 1 level home   Team Discussion: Patient with Chronic T1 DM; continue monitoring and adjusting medications. Patient is drowsy; "more so than usual" on multi antianxiety medications post ICH; stimulant contraindicated. Note poor endurance, low energy and limited participation in therapy session. Progress limited by motor control deficits, impulsivity, intellectual awareness deficits, memory issues, and attention.deficits.  Patient on target to meet rehab goals: yes, currently needs supervision overall for self care and mobility with CGA - min assist. Needs max assist for cognition. Goals for discharge set for mod I overall.  *See Care Plan and progress notes for long and short-term goals.   Revisions to Treatment Plan:  H2O protocol   Teaching Needs: Safety, medications, dietary modifications, transfers, toileting, etc.   Current Barriers to Discharge: Decreased caregiver support  Possible Resolutions to Barriers: Family education OP follow up  services DME: TTB     Medical Summary Current Status: CBG control improving , ESRD, hx anxiety and is on multiple medication,  Barriers to Discharge: Medical stability;Renal Insufficiency/Failure   Possible Resolutions to [employer]: work on lethargy and fatigue   Continued Need for Acute  Rehabilitation Level of Care: The patient requires daily medical management by a physician with specialized training in physical medicine and rehabilitation for the following reasons: Direction of a multidisciplinary physical rehabilitation program to maximize functional independence : Yes Medical management of patient stability for increased activity during participation in an intensive rehabilitation regime.: Yes Analysis of laboratory values and/or radiology reports with any subsequent need for medication adjustment and/or medical intervention. : Yes   I attest that I was present, lead the team conference, and concur with the assessment and plan of the team.   Dorien Chihuahua B 02/08/2023, 2:07 PM

## 2023-02-08 NOTE — Progress Notes (Signed)
Occupational Therapy Session Note  Patient Details  Name: Angela [name] MRN: [id number] Date of Birth: [birth year]  Today's Date: 02/08/2023 OT Individual Time: [id number] OT Individual Time Calculation (min): 57 min    Short Term Goals: Week 1:  OT Short Term Goal 1 (Week 1): STGs=LTGs due to patient's length of stay.  Skilled Therapeutic Interventions/Progress Updates:  Pt greeted asleep in supine with mother present, pt needed MAX stimulus to arouse.pt completed supine>sit with supervision. Pt keeping eyes closed from EOB needing MAX cues to complete LB dressing on her own, asking her mother and therapist to help but with encouragement can complete task with supervision. Supervision to don shoes from EOB. Nurse enter to provide meds, pt laid self back down until meds were ready.   Pt completed stand pivot to w/c ot R side with supervision with no AD. Pt transported to ADL apt with total A for time mgmt. Pt completed ambulatory transfers to flat HOB, recliner and TTB with supervision with no AD or LOB.   Pt completed IADL task in kitchen where pt instructed to collect kitchen items out of cabinets from [facility] and below knee level with no AD. Pt does c/o back pain, therefore education provided on bending at knees vs back for pain mgmt. Pt reports that her mom will make all of her meals but encouraged pt to complete IADLs as able to promote functional independence.   Additionally worked on various therapeutic activities focused on challenging dynamic gait and RLE attention during ambulation. Pt instructed to weave in between cones with no AD with an emphasis on R sided attention, pt completed task with no AD and supervision. Next, had pt step over cones with RLE to further facilitate improved R sided attention. During task pt tripped over cone and landed on her R knee. Pt able to get herself back up to w/c with only supervision and MIN cues for technique. Nurse and MD aware, vitals assessed 149/83 (  100).            Ended session with pt supine in bed with all needs within reach and bed alarm activated.                   Therapy Documentation Precautions:  Precautions Precautions: Fall, Other (comment) Precaution Comments: left side weakness, HD on MWF schedule, Restrictions Weight Bearing Restrictions: No  Pain: unrated back pain reported, rest breaks and education provided on compensatory methods for managing pain provided.   Therapy/Group: Individual Therapy  Corinne Ports [facility] 02/08/2023, 10:33 AM

## 2023-02-08 NOTE — Significant Event (Signed)
Hypoglycemic Event LATE ENTRY  Date of Event : 02/06/23  CBG: 55 @ 2130 hrs post dialysis.  Treatment: Family requested to feed the patient her Dinner. Nectar thick juices offered.  Symptoms:    Patient was a bit lethargic/exhausted but very coherent and responses were appropriate.  Follow-up CBG: Time:2210 hrs CBG Result: 66  Bits of Pudding from plate offered and medications given with whole cup of apple sauce per patient request. CBG at 2300 hrs was 92.  Possible Reasons for Event:  Patient was having dialysis done and possibly had inadequate meal intake as she had missed dinner.   Comments/MD notified: Assigned nurse informed, and scheduled CBG checks with appropriate interventions adhered to. Patient VS was stable, and patient slept afterwards.    Jari Pigg [place]

## 2023-02-08 NOTE — Progress Notes (Signed)
Post HD Tx   02/08/23 1959  Vitals  Temp 98.1 F (36.7 C)  Pulse Rate 92  Resp 11  BP (!) 178/90  SpO2 99 %  O2 Device Room Air  Oxygen Therapy  Patient Activity (if Appropriate) In bed  Pulse Oximetry Type Continuous  Oximetry Probe Site Changed No  Post Treatment  Dialyzer Clearance Lightly streaked  Duration of HD Treatment -hour(s) 3.5 hour(s)  Liters Processed 68.8  Fluid Removed (mL) 2500 mL  Tolerated HD Treatment Yes  Post-Hemodialysis Comments Tx completed and tolerated well no issues/concerns  AVG/AVF Arterial Site Held (minutes) 8 minutes  AVG/AVF Venous Site Held (minutes) 4 minutes

## 2023-02-08 NOTE — Progress Notes (Signed)
Occupational Therapy Session Note  Patient Details  Name: Angela [name] MRN: [id number] Date of Birth: November 19, 1986  Today's Date: 02/08/2023 OT Individual Time: 1035-1105 OT Individual Time Calculation (min): 30 min  and Today's Date: 02/08/2023 OT Missed Time: 30 Minutes Missed Time Reason: Patient ill (comment) (RN aware, abdominal pain)   Short Term Goals: Week 1:  OT Short Term Goal 1 (Week 1): STGs=LTGs due to patient's length of stay.  Skilled Therapeutic Interventions/Progress Updates:    Pt received in bed sound asleep.  After a few minutes of prompting pt able to wake up but she was moving slowly and having difficulty becoming fully alert.  Cued her to work on arm stretches and this helped her to become more alert.  Pt ambulated to wc to sit with close S but sat suddenly by not fully turning her body.  When asked why, pt stated "oh I just wasn't paying attention" Discussed the importance of her paying attention to her environment to prevent falls.  Pt taken to day room.  Pt sat on mat and worked on arm exercises with light dowel bar.  She then worked on sit to stands with dowel in hands so she had to work with her Legs only to power up. Pt able to do so with S and once standing she reached arms overhead. Cued pt to do 12 in a row but after each rep she would just stop and sit on mat.  Pt stated she was tired.  After 5th rep she c/o R ear pressure.  Then after 6th rep she just flopped back on mat in supine.  Pt alert and responded that she was fine just very tired. Then she suddenly c/o abdominal pain saying she had pressure around her torso. Pt transferred back to wc and then taken to room to get in bed with S.  Pt flopped on her belly and needed 3 cues to lay supine so NT could check vitals.  Informed RN of pt's complaints.  Pt resting in bed with alarm on.    Therapy Documentation Precautions:  Precautions Precautions: Fall, Other (comment) Precaution Comments: left side weakness,  HD on MWF schedule, Restrictions Weight Bearing Restrictions: No Therapy Vitals Temp: 98.1 F (36.7 C) Temp Source: Oral Pulse Rate: 92 Resp: 18 BP: (Abnormal) 162/82 Patient Position (if appropriate): Lying Oxygen Therapy SpO2: 100 % O2 Device: Room Air Pain: C/o abdominal pain, RN aware    Therapy/Group: Individual Therapy  Wood Novacek 02/08/2023, 11:26 AM

## 2023-02-09 LAB — CBC WITH DIFFERENTIAL/PLATELET
Abs Immature Granulocytes: 0.36 10*3/uL — ABNORMAL HIGH (ref 0.00–0.07)
Basophils Absolute: 0.1 10*3/uL (ref 0.0–0.1)
Basophils Relative: 1 %
Eosinophils Absolute: 0.3 10*3/uL (ref 0.0–0.5)
Eosinophils Relative: 3 %
HCT: 27.2 % — ABNORMAL LOW (ref 36.0–46.0)
Hemoglobin: 8.9 g/dL — ABNORMAL LOW (ref 12.0–15.0)
Immature Granulocytes: 4 %
Lymphocytes Relative: 23 %
Lymphs Abs: 2.4 10*3/uL (ref 0.7–4.0)
MCH: 30 pg (ref 26.0–34.0)
MCHC: 32.7 g/dL (ref 30.0–36.0)
MCV: 91.6 fL (ref 80.0–100.0)
Monocytes Absolute: 1.1 10*3/uL — ABNORMAL HIGH (ref 0.1–1.0)
Monocytes Relative: 11 %
Neutro Abs: 6.1 10*3/uL (ref 1.7–7.7)
Neutrophils Relative %: 58 %
Platelets: 162 10*3/uL (ref 150–400)
RBC: 2.97 MIL/uL — ABNORMAL LOW (ref 3.87–5.11)
RDW: 15.2 % (ref 11.5–15.5)
WBC: 10.3 10*3/uL (ref 4.0–10.5)
nRBC: 0.5 % — ABNORMAL HIGH (ref 0.0–0.2)

## 2023-02-09 LAB — RENAL FUNCTION PANEL
Albumin: 2.9 g/dL — ABNORMAL LOW (ref 3.5–5.0)
Anion gap: 10 (ref 5–15)
BUN: 13 mg/dL (ref 6–20)
CO2: 28 mmol/L (ref 22–32)
Calcium: 9.7 mg/dL (ref 8.9–10.3)
Chloride: 96 mmol/L — ABNORMAL LOW (ref 98–111)
Creatinine, Ser: 4.82 mg/dL — ABNORMAL HIGH (ref 0.44–1.00)
GFR, Estimated: 11 mL/min — ABNORMAL LOW (ref >60)
Glucose, Bld: 180 mg/dL — ABNORMAL HIGH (ref 70–99)
Phosphorus: 4.2 mg/dL (ref 2.5–4.6)
Potassium: 3.7 mmol/L (ref 3.5–5.1)
Sodium: 134 mmol/L — ABNORMAL LOW (ref 135–145)

## 2023-02-09 LAB — GLUCOSE, CAPILLARY
Glucose-Capillary: 105 mg/dL — ABNORMAL HIGH (ref 70–99)
Glucose-Capillary: 163 mg/dL — ABNORMAL HIGH (ref 70–99)
Glucose-Capillary: 169 mg/dL — ABNORMAL HIGH (ref 70–99)
Glucose-Capillary: 201 mg/dL — ABNORMAL HIGH (ref 70–99)
Glucose-Capillary: 214 mg/dL — ABNORMAL HIGH (ref 70–99)
Glucose-Capillary: 75 mg/dL (ref 70–99)
Glucose-Capillary: 94 mg/dL (ref 70–99)

## 2023-02-09 MED ORDER — ALTEPLASE 2 MG IJ SOLR: 2.0000 mg | Freq: Once | INTRAMUSCULAR | Status: DC | PRN

## 2023-02-09 MED ORDER — CARVEDILOL 3.125 MG PO TABS
3.1250 mg | ORAL_TABLET | Freq: Two times a day (BID) | ORAL | Status: DC
  Administered 2023-02-09 – 2023-02-11 (×3): 3.125 mg via ORAL
  Filled 2023-02-09 (×4): qty 1

## 2023-02-09 MED ORDER — CARVEDILOL 3.125 MG PO TABS: 3.1250 mg | ORAL_TABLET | Freq: Two times a day (BID) | ORAL | Status: DC

## 2023-02-09 MED ORDER — HEPARIN SODIUM (PORCINE) 1000 UNIT/ML DIALYSIS: 1000.0000 [IU] | INTRAMUSCULAR | Status: DC | PRN

## 2023-02-09 MED ORDER — CARVEDILOL 6.25 MG PO TABS: 6.2500 mg | ORAL_TABLET | Freq: Two times a day (BID) | ORAL | Status: DC

## 2023-02-09 NOTE — Progress Notes (Signed)
[facility] Progress Note   Subjective:    Seen and examined patient at bedside. Tolerated yesterday's with net UF 2.5L. Seen sleeping in bed c/o headache. Denies SOB and CP. BP remains elevated. Next HD 2/23.  Objective Vitals:   02/08/23 2019 02/08/23 2049 02/09/23 0447 02/09/23 0500  BP:  (!) 145/83 (!) 180/98   Pulse:  95 96   Resp:  18 19   Temp:  98.5 F (36.9 C) 99 F (37.2 C)   TempSrc:      SpO2:  100% 99%   Weight: 61.5 kg   60.7 kg  Height:       Physical Exam General: Well appearing woman, NAD. Room air. Heart: RRR; no murmur Lungs: CTA anteriorly Abdomen: soft Extremities: no LE edema Dialysis Access: LUE AVF + bruit  Filed Weights   02/08/23 0424 02/08/23 2019 02/09/23 0500  Weight: 65.3 kg 61.5 kg 60.7 kg    Intake/Output Summary (Last 24 hours) at 02/09/2023 1240 Last data filed at 02/09/2023 [id number] Gross per 24 hour  Intake 236 ml  Output 2500 ml  Net -2264 ml    Additional Objective Labs: Basic Metabolic Panel: Recent Labs  Lab 02/04/23 0258 02/06/23 0610 02/09/23 0627  NA 132* 132* 134*  K 5.0 4.7 3.7  CL 93* 91* 96*  CO2 25 24 28  $ GLUCOSE 153* 111* 180*  BUN 25* 40* 13  CREATININE 4.58* 8.04* 4.82*  CALCIUM 9.8 10.1 9.7  PHOS 5.3* 6.3* 4.2   Liver Function Tests: Recent Labs  Lab 02/04/23 0258 02/06/23 0610 02/09/23 0627  ALBUMIN 2.6* 3.0* 2.9*   No results for input(s): "LIPASE", "AMYLASE" in the last 168 hours. CBC: Recent Labs  Lab 02/04/23 0258 02/06/23 0610 02/08/23 1730 02/09/23 0627  WBC 8.1 12.4* 10.7* 10.3  NEUTROABS  --   --   --  6.1  HGB 7.9* 8.9* 8.7* 8.9*  HCT 25.3* 26.9* 26.9* 27.2*  MCV 91.0 89.7 92.4 91.6  PLT 140* 219 180 162   Blood Culture    Component Value Date/Time   SDES BLOOD BLOOD LEFT HAND 01/27/2023 1836   SPECREQUEST  01/27/2023 1836    BOTTLES DRAWN AEROBIC AND ANAEROBIC Blood Culture adequate volume   CULT  01/27/2023 1836    NO GROWTH 5 DAYS Performed at [facility] [address] [place], [place] [address]    REPTSTATUS 02/01/2023 FINAL 01/27/2023 1836    Cardiac Enzymes: No results for input(s): "CKTOTAL", "CKMB", "CKMBINDEX", "TROPONINI" in the last 168 hours. CBG: Recent Labs  Lab 02/08/23 2230 02/09/23 0018 02/09/23 0443 02/09/23 0752 02/09/23 1200  GLUCAP 132* 163* 169* 214* 201*   Iron Studies: No results for input(s): "IRON", "TIBC", "TRANSFERRIN", "FERRITIN" in the last 72 hours. Lab Results  Component Value Date   INR 0.96 09/21/2017   INR 1.07 08/25/2017   Studies/Results: No results found.  Medications:   amLODipine  10 mg Oral QHS   busPIRone  15 mg Oral QHS   carvedilol  3.125 mg Oral BID WC   Chlorhexidine Gluconate Cloth  6 each Topical Daily   [START ON 02/10/2023] darbepoetin (ARANESP) injection - DIALYSIS  100 mcg Subcutaneous Q Fri-1800   diltiazem  60 mg Oral Q6H   escitalopram  20 mg Oral Daily   gabapentin  100 mg Oral TID   heparin injection (subcutaneous)  5,000 Units Subcutaneous Q8H   hydrALAZINE  100 mg Oral Q8H   insulin aspart  0-6 Units Subcutaneous TID WC  insulin aspart  1 Units Subcutaneous TID WC   insulin glargine-yfgn  12 Units Subcutaneous Daily   lanthanum  1,000 mg Oral TID with meals   levETIRAcetam  500 mg Oral BID   levothyroxine  175 mcg Oral q morning   multivitamin  1 tablet Oral QHS   pantoprazole  40 mg Oral Daily   senna-docusate  1 tablet Oral BID    Dialysis Orders: MWF [facility] [address] 3.5hr, EDW 62kg, 2K/2.5Ca bath, LUE AVF, Prev was getting Hep 2600 + 500 u/hr - now held  Assessment/Plan: 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 Slowly improving mentation/recovery. ESRD: Continue HD on MWF schedule - next HD 2/23, no added heparin. 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 Phos improving,  continue lanthanum as binder, noted some missed doses over past 3 days. Acute hypoxic respiratory failure: Secondary to altered mental status in the setting of intracranial bleed.  Resolved/extubated and remains on room air without supplemental oxygen. Hypertension: BP remains elevated, noted coreg was added today, continue amlodipine + diltiazem. Monitor trend and titrate up if indicated. Dispo: Transferred to CIR on 2/17. Reviewed Renal Navigator's note today: dc date plan for 2/27. Apparently patient's Mother is requesting her to be transferred to another HD clinic in [place]; however, already discussed she will need to imitate that request at patient primary outpatient HD center.   Tobie Poet, NP [facility] 02/09/2023,12:40 PM  LOS: 5 days

## 2023-02-09 NOTE — Progress Notes (Addendum)
PROGRESS NOTE   Subjective/Complaints:  Hypoglycemic episode associated with poor po intake, HD  ROS: denies shortness of breath  Objective:   VAS [place] UPPER EXTREMITY VENOUS DUPLEX  Result Date: 02/07/2023 UPPER VENOUS STUDY  Patient Name:  Angela [name]  Date of Exam:   02/07/2023 Medical Rec #: [id number]       Accession #:    [id number] Date of Birth: 12-30-1985       Patient Gender: F Patient Age:   37 years Exam Location:  [facility] Procedure:      VAS [place] UPPER EXTREMITY VENOUS DUPLEX Referring Phys: Risa Grill --------------------------------------------------------------------------------  Indications: Swelling Comparison Study: no prior Performing Technologist: Archie Patten RVS  Examination Guidelines: A complete evaluation includes B-mode imaging, spectral Doppler, color Doppler, and power Doppler as needed of all accessible portions of each vessel. Bilateral testing is considered an integral part of a complete examination. Limited examinations for reoccurring indications may be performed as noted.  Right Findings: +----------+------------+---------+-----------+----------+-------------+ RIGHT     CompressiblePhasicitySpontaneousProperties   Summary    +----------+------------+---------+-----------+----------+-------------+ IJV           Full                                  rouleaux flow +----------+------------+---------+-----------+----------+-------------+ Subclavian    Full       Yes       Yes                            +----------+------------+---------+-----------+----------+-------------+ Axillary      Full       Yes       Yes                            +----------+------------+---------+-----------+----------+-------------+ Brachial      Full       Yes       Yes                            +----------+------------+---------+-----------+----------+-------------+ Radial        Full        Yes       Yes                            +----------+------------+---------+-----------+----------+-------------+ Ulnar         Full                                                +----------+------------+---------+-----------+----------+-------------+ Cephalic      Full                                                +----------+------------+---------+-----------+----------+-------------+ Basilic  Full                                                +----------+------------+---------+-----------+----------+-------------+  Left Findings: +----------+------------+---------+-----------+----------+-------+ LEFT      CompressiblePhasicitySpontaneousPropertiesSummary +----------+------------+---------+-----------+----------+-------+ Subclavian               Yes       Yes                      +----------+------------+---------+-----------+----------+-------+  Summary:  Right: No evidence of deep vein thrombosis in the upper extremity. No evidence of superficial vein thrombosis in the upper extremity. Rouleaux flow noted in the IJV.  *See table(s) above for measurements and observations.  Diagnosing physician: Deitra Mayo MD Electronically signed by Deitra Mayo MD on 02/07/2023 at 12:10:31 PM.    Final    Recent Labs    02/08/23 1730 02/09/23 0627  WBC 10.7* 10.3  HGB 8.7* 8.9*  HCT 26.9* 27.2*  PLT 180 162    Recent Labs    02/09/23 0627  NA 134*  K 3.7  CL 96*  CO2 28  GLUCOSE 180*  BUN 13  CREATININE 4.82*  CALCIUM 9.7     Intake/Output Summary (Last 24 hours) at 02/09/2023 0812 Last data filed at 02/09/2023 0807 Gross per 24 hour  Intake 354 ml  Output 2500 ml  Net -2146 ml         Physical Exam: Vital Signs Blood pressure (!) 180/98, pulse 96, temperature 99 F (37.2 C), resp. rate 19, height 5' 4.96" (1.65 m), weight 60.7 kg, SpO2 99 %.  Drowsy awakens to voice and light touch  General: No acute distress Mood and  affect are appropriate Heart: Regular rate and rhythm no rubs murmurs or extra sounds Lungs: Clear to auscultation, breathing unlabored, no rales or wheezes Abdomen: Positive bowel sounds, soft nontender to palpation, nondistended Extremities: No clubbing, cyanosis, or edema MSK:  RIght knee without abrasion or ecchymosis , no pain to palp or with ROM   Skin:LUE AVF Neuro: 4/5 throughout, sensation is intact. Can follow commands. Impaired safety awareness. Can recall 2/3 objects. Hypophonic. Sensation intact   Assessment/Plan: 1. Functional deficits which require 3+ hours per day of interdisciplinary therapy in a comprehensive inpatient rehab setting. Physiatrist is providing close team supervision and 24 hour management of active medical problems listed below. Physiatrist and rehab team continue to assess barriers to discharge/monitor patient progress toward functional and medical goals  Care Tool:  Bathing    Body parts bathed by patient: Right arm, Left arm, Chest, Abdomen, Front perineal area, Buttocks, Right upper leg, Left upper leg, Right lower leg, Left lower leg, Face         Bathing assist Assist Level: Set up assist     Upper Body Dressing/Undressing Upper body dressing   What is the patient wearing?: Pull over shirt    Upper body assist Assist Level: Set up assist    Lower Body Dressing/Undressing Lower body dressing      What is the patient wearing?: Pants     Lower body assist Assist for lower body dressing: Set up assist     Toileting Toileting Toileting Activity did not occur (Clothing management and hygiene only): N/A (no void or bm)  Toileting assist Assist for toileting: Contact Guard/Touching assist     Transfers Chair/bed transfer  Transfers assist     Chair/bed transfer assist level: Contact Guard/Touching assist     Locomotion Ambulation   Ambulation assist      Assist level: Minimal Assistance - Patient > 75% Assistive  device: Other (comment) (at gait belt and L shoulder) Max distance: 161   Walk 10 feet activity   Assist     Assist level: Minimal Assistance - Patient > 75% Assistive device: Other (comment) (at gait belt and L shoulder)   Walk 50 feet activity   Assist    Assist level: Minimal Assistance - Patient > 75% Assistive device: Other (comment) (at gait belt and L shoulder)    Walk 150 feet activity   Assist    Assist level: Minimal Assistance - Patient > 75% Assistive device: Other (comment) (at gait belt and L shoulder)    Walk 10 feet on uneven surface  activity   Assist     Assist level: Minimal Assistance - Patient > 75% (ramp) Assistive device: Other (comment) (at gait belt and L shoulder and does reach for external support if available.)   Wheelchair     Assist Is the patient using a wheelchair?: Yes Type of Wheelchair: Manual    Wheelchair assist level: Supervision/Verbal cueing Max wheelchair distance: 100    Wheelchair 50 feet with 2 turns activity    Assist    Wheelchair 50 feet with 2 turns activity did not occur:  (per PT documentation)   Assist Level: Supervision/Verbal cueing   Wheelchair 150 feet activity     Assist      Assist Level: Moderate Assistance - Patient 50 - 74% (per PT documentation of 100 feet max distance)   Blood pressure (!) 180/98, pulse 96, temperature 99 F (37.2 C), resp. rate 19, height 5' 4.96" (1.65 m), weight 60.7 kg, SpO2 99 %.  Medical Problem List and Plan: 1. Functional deficits secondary to [place] left caudate with resolving IVH - uncontrolled              -patient may shower             -ELOS/Goals: 02/14/23 S               2.  Antithrombotics: -DVT/anticoagulation:  Pharmaceutical: Heparin             -antiplatelet therapy: none   3. Pain Management: Tylenol as needed   4. Mood/Behavior/Sleep: LCSW to evaluate and provide emotional support             -depression/anxiety: continue Buspar  and Lexapro             -antipsychotic agents: n/a   5. Neuropsych/cognition: This patient not capable of making decisions on her own behalf.   6. Skin/Wound Care: Routine skin care checks   7. Fluids/Electrolytes/Nutrition: strict Is and Os; chemistries as per nephrology             -TF d/c ed             -continue Fosrenol, MVI, Aranesp   8: Hypertension: monitor TID and prn (home Lasix, losartan held>>other home meds restarted             -continue amlodipine 10 mg daily             -diltiazem 60 mg TID             -hydralazine 100 mg TID     Vitals:   02/08/23 2049 02/09/23 0447  BP: (!) 145/83 [name])  180/98  Pulse: 95 96  Resp: 18 19  Temp: 98.5 F (36.9 C) 99 F (37.2 C)  SpO2: 100% 99%  Labile pulse mildly elevated and BP still up will start low dose coreg, may titrate this up and reduce diltiazem  9: Hyperlipidemia: home statin held   10: Hydrocephalus: resolved; EVD placed 2/09, removed 2/16   11: ESRD: HD on M/W/F via LUE AVF   12: DM-1; uncontolled: continue CBGs q 4 hours. Sensitive sliding scale changed to very sensitive given CBG of 32 on admission -decrease Semglee to 12 units daily -reduce novalog to  1U TID novolog with meals as per diabetic coordinator recommendations. Hold if po intake <50%  CBG (last 3)  Recent Labs    02/09/23 0018 02/09/23 0443 02/09/23 0752  GLUCAP 163* 169* 214*   All values <300, cont current management, pt feels diet is close to home  Used tresiba and novalog, now on Semglee , low CBG due to poor intake in HD, would benefit from Nepro in HD   13: Seizure disorder: home dose Keppra resumed             continue Keppra 500 mg BID             -gabapentin 100 mg TID  may be contributing to drowsiness 14: Hypothyroidism: continue Synthroid   15: Anemia, chronic disease: s/p 1 unit PCs, follow-up CBC    LOS: 5 days A FACE TO FACE EVALUATION WAS PERFORMED  Charlett Blake 02/09/2023, 8:12 AM

## 2023-02-09 NOTE — Progress Notes (Signed)
Occupational Therapy Session Note  Patient Details  Name: Angela [name] MRN: [id number] Date of Birth: [birth year]  Today's Date: 02/09/2023 OT Individual Time: [id number] OT Individual Time Calculation (min): 13 min    Short Term Goals: Week 1:  OT Short Term Goal 1 (Week 1): STGs=LTGs due to patient's length of stay.  Skilled Therapeutic Interventions/Progress Updates:  Pt seen to make up for missed minutes. Pt received supine in bed with nurse and NT present. Pt asleep but able to arouse with max encouragement. Pt completed supine>sit with MIN A d/t pt continuing to lay herself back to down. Pt able to stand to RW with CGA, offered ADLs however pt only agreeable to complete short distance functional mobility in hallway to facilitate improved OOB tolerance, activity tolerance and functional mobility.pt completed ~ 60 ft of functional ambulation with RW and CGA- MINA. Pt continues to present with R sided in attention needing cues to be mindful of RLE placement during gait. Pt did stop halfway to lean on RW d/t fatigue/pain in BLEs.   Ended session with pt supine in bed all needs within reach and bed alarm activated.   Therapy Documentation Precautions:  Precautions Precautions: Fall, Other (comment) Precaution Comments: left side weakness, HD on MWF schedule, Restrictions Weight Bearing Restrictions: No  Pain:unrated pain reported in BLEs when ambulating, pt required standing rest break.     Therapy/Group: Individual Therapy  Precious Haws 02/09/2023, 3:46 PM

## 2023-02-09 NOTE — Progress Notes (Signed)
Contacted by CSW yesterday regarding pt's d/c date of 2/27. Also advised that pt's mother requesting a new HD clinic in [place]. Contacted [employer] staff late yesterday to discuss the above request. Advised that pt will need to resume at her home clinic and request transfer through them. Changing clinics will require changing nephrologist and pt's clinic will need to initiate this request. This info was provided to CSW this morning to provide to pt's family.   Melven Sartorius Renal Navigator [phone]

## 2023-02-09 NOTE — Progress Notes (Signed)
Physical Therapy Session Note  Patient Details  Name: Angela [name] MRN: [id number] Date of Birth: [birth year]  Today's Date: 02/09/2023 PT Individual Time: 0806-0900 and [id number] PT Individual Time Calculation (min): 54 min and 38 min   Short Term Goals: Week 1:  PT Short Term Goal 1 (Week 1): STG=LTG 2/2 ELOS   Skilled Therapeutic Interventions/Progress Updates:  Session 1.  Pt received supine in bed, asleep. Aroused with effort, and agreeable to PT. Supine>sit transfer without  assist or cues.    Dressing at EOB with supervision assist for donning shirt and total A for donning, shoes, at pt continually laid back into bed to prior to completing donning of shoe. Mod assist to sit back up at EOB due to fatigue. Stand pivot transfer to [facility] with supervision assist.   Pt transported to orthogym in [place]. Dynamic gait training with no AD: over level surface x 61f, weave through cones 2 x 8, side stepp ing R and L   PT instructed pt in TUG:15.4sec (12.9, 17.9 sec) (average of 2 trials; >13.5 sec indicates increased fall risk)   Pt returned to room and performed stand pivot transfer to bed with supervision assist for safety. Sit>supine completed with supervision assist, and left supine in bed with call bell in reach and all needs met.    Session 2.  Pt received supine in bed and agreeable to PT.  PT assisted to don shoes in supine with total A*. Supine>sit transfer with mod assist due tto lethargy. [facility] Pt transported to rehab gym. Pt noted to fall asleep in WC, and start sliding out of seat. Total A to return to sitting position.   Gait training with no AD x 1569fand min assist for safety due to mild R LOB intermittently.  Dynamic balance training in parallel bars side stepping L and R,3 x 10 ft bil. Minisquat x 10 with BUE supported on rails  .   Nustep reciprocal movement training and sustained attention task training x 8 min. Pt instructed to  stop at 4 and 6 minutes, but unable to recall finish time throughout task.   Pt returned to room and performed ambulatory transfer to bed with RW and CGA x 1506fSit>supine completed without assist, and left supine in bed with call bell in reach and all needs met.       Therapy Documentation Precautions:  Precautions Precautions: Fall, Other (comment) Precaution Comments: left side weakness, HD on MWF schedule, Restrictions Weight Bearing Restrictions: No General:   Vital Signs:  Pain:   Mobility:   Locomotion :    Trunk/Postural Assessment :    Balance:   Exercises:   Other Treatments:      Therapy/Group: Individual Therapy  AusLorie Phenix22/2024, 9:58 AM

## 2023-02-09 NOTE — Progress Notes (Signed)
Speech Language Pathology Daily Session Note  Patient Details  Name: Angela [name] MRN: [id number] Date of Birth: 06-01-1986  Today's Date: 02/09/2023 SLP Individual Time: [id number] SLP Individual Time Calculation (min): 45 min and Today's Date: 02/09/2023 SLP Missed Time: 15 Minutes Missed Time Reason: Patient fatigue;Patient unwilling to participate  Short Term Goals: Week 1: SLP Short Term Goal 1 (Week 1): Pt will consume therapeutic trials of thin liquids with minimal overt s/s of aspiration and min cues for use of swallowing precautions. SLP Short Term Goal 2 (Week 1): Pt will consume dys 3 textures and nectar thick liquids with minimal overt s/s of aspiration and min cues of use of swallowing precautions. SLP Short Term Goal 3 (Week 1): Pt will sustain her attention to basic tasks for ~5 minute intervals with min cues for redirection. SLP Short Term Goal 4 (Week 1): Pt will complete basic tasks with mod cues for functional problem solving. SLP Short Term Goal 5 (Week 1): Pt will utilize external aids with mod cues to recall daily information. SLP Short Term Goal 6 (Week 1): Pt will utilize compensatory strategies for naming  with mod assist during functional conversational tasks.  Skilled Therapeutic Interventions: Skilled ST treatment focused on swallowing and cognitive goals. Pt was sleeping in bed on arrival with covers pulled up over her face. Pt roused to min verbal stimuli and patient required extensive time to sit up to EOB. Once sitting upright, pt was alert and cooperative, but frequently laid back down if there was not constant engagement. Pt was oriented to person, place, and year independent of cues; required min A verbal cues for month; max A explanation cues for situation (pt stated she was in an accident). Pt sustained attention to functional tasks for ~5 minute intervals with min A verbal redirection cues cue to fatigue.   Swallowing: Of note, pt had what was perceived to  be mild chest congestion at baseline prior to PO trials which improved with cued throat clear. SLP facilitated therapeutic PO trials with regular textures and thin liquids (trialed seperately). Pt consumed regular textures with mildly prolonged mastication, what appeared to be swift swallow response, complete oral clearance, and without overt s/sx of aspiration. Pt then consumed nectar thick liquids by cup with throat clear x1 and no further overt or subtle s/sx of aspiration and clear vocal quality post swallows. Following brief delay pt consumed thin liquids (water) by cup with delayed throat clear x2. Pt consumed large and sequential sips of thin liquids despite direct verbal cues to take "small sips" and "one sip at a time." Suspect decreased carry over of swallowing compensatory strategies to maximize safety 2/2 cognitive impairments. Given tolerance to today's trials, recommend diet advancement to regular textures, continue nectar thick liquids and crushed medications. Recommend MBS tomorrow to further evaluate prior to consideration of liquid advancement. Pt verbalized understanding and agreement. Following trials pt laid back down exhibited minimal willingness to participate further d/t fatigue, thus missing 15 minutes of skilled intervention. Patient was left in bed with alarm activated and immediate needs within reach at end of session. Continue per current plan of care.      Pain  None/denied  Therapy/Group: Individual Therapy  Patty Sermons 02/09/2023, 9:53 AM

## 2023-02-09 NOTE — Progress Notes (Signed)
Occupational Therapy Session Note  Patient Details  Name: Angela [name] MRN: [id number] Date of Birth: 1986/01/29  Today's Date: 02/09/2023 OT Individual Time: [id number] OT Individual Time Calculation (min): 10 min  and Today's Date: 02/09/2023 OT Missed Time: 50 Minutes Missed Time Reason: Patient fatigue;Patient unwilling/refused to participate without medical reason   Short Term Goals: Week 1:  OT Short Term Goal 1 (Week 1): STGs=LTGs due to patient's length of stay.  Skilled Therapeutic Interventions/Progress Updates:    Pt received in bed sound asleep.  Spent 10 min trying to wake pt up.  She would become alert briefly and I tried several strategies to have her participate in a session (offered a shower, wii game, other games).  Pt would open eyes briefly but kept closing them. Turned on bright lights removed covers. I started to "help" pt come up to sit by physically moving her but then she would just lay back down.   Pt kept saying she was just too tired because she had already exercises with PT. Discussed with pt the importance of participation for her progress to return home.  Also that she can not miss more than 3 sessions in a row.  Pt acknowledged this but said she was too tired to participate.  Pt sleeping with bed alarm set.   Therapy Documentation Precautions:  Precautions Precautions: Fall, Other (comment) Precaution Comments: left side weakness, HD on MWF schedule, Restrictions Weight Bearing Restrictions: No  Pain: no c/o pain    ADL: ADL Eating: Supervision/safety Where Assessed-Eating: Bed level Upper Body Bathing: Setup Where Assessed-Upper Body Bathing: Shower Lower Body Bathing: Setup Where Assessed-Lower Body Bathing: Shower Upper Body Dressing: Setup Where Assessed-Upper Body Dressing: Chair Lower Body Dressing: Setup Where Assessed-Lower Body Dressing: Chair Toileting: Minimal assistance Where Assessed-Toileting: Bedside Commode Toilet Transfer:  Minimal assistance Toilet Transfer Method: Stand pivot Science writer: Geophysical data processor: Curator Method: Heritage manager: Transfer tub bench ADL Comments: Pt requiring encouragement for independence.   Therapy/Group: Individual Therapy  [place] 02/09/2023, 9:25 AM

## 2023-02-10 ENCOUNTER — Inpatient Hospital Stay (HOSPITAL_COMMUNITY): Payer: Medicaid Other

## 2023-02-10 LAB — GLUCOSE, CAPILLARY
Glucose-Capillary: 193 mg/dL — ABNORMAL HIGH (ref 70–99)
Glucose-Capillary: 343 mg/dL — ABNORMAL HIGH (ref 70–99)
Glucose-Capillary: 215 mg/dL — ABNORMAL HIGH (ref 70–99)
Glucose-Capillary: 65 mg/dL — ABNORMAL LOW (ref 70–99)
Glucose-Capillary: 84 mg/dL (ref 70–99)
Glucose-Capillary: 169 mg/dL — ABNORMAL HIGH (ref 70–99)

## 2023-02-10 NOTE — Plan of Care (Signed)
Problem: RH Balance Goal: LTG Patient will maintain dynamic standing with ADLs (OT) Description: LTG:  Patient will maintain dynamic standing balance with assist during activities of daily living (OT)  Flowsheets (Taken 02/10/2023 1254) LTG: Pt will maintain dynamic standing balance during ADLs with: (Due to safety concerns with low endurance, excessive drowsiness, R knee instability and fluctuating balance LTGs downgraded from Mod I to CGA.) Contact Guard/Touching assist Note: Due to safety concerns with low endurance, excessive drowsiness, R knee instability and fluctuating balance LTGs downgraded from Mod I to S/CGA.   Problem: Sit to Stand Goal: LTG:  Patient will perform sit to stand in prep for activites of daily living with assistance level (OT) Description: LTG:  Patient will perform sit to stand in prep for activites of daily living with assistance level (OT) Flowsheets (Taken 02/10/2023 1254) LTG: PT will perform sit to stand in prep for activites of daily living with assistance level: (Due to safety concerns with low endurance, excessive drowsiness, R knee instability and fluctuating balance LTGs downgraded from Mod I to S) Supervision/Verbal cueing Note: Due to safety concerns with low endurance, excessive drowsiness, R knee instability and fluctuating balance LTGs downgraded from Mod I to S   Problem: RH Bathing Goal: LTG Patient will bathe all body parts with assist levels (OT) Description: LTG: Patient will bathe all body parts with assist levels (OT) Flowsheets (Taken 02/10/2023 1254) LTG: Pt will perform bathing with assistance level/cueing: (Due to safety concerns with low endurance, excessive drowsiness, R knee instability and fluctuating balance LTGs downgraded from Mod I to S) Supervision/Verbal cueing Note: Due to safety concerns with low endurance, excessive drowsiness, R knee instability and fluctuating balance LTGs downgraded from Mod I to S   Problem: RH Dressing Goal:  LTG Patient will perform lower body dressing w/assist (OT) Description: LTG: Patient will perform lower body dressing with assist, with/without cues in positioning using equipment (OT) Flowsheets (Taken 02/10/2023 1254) LTG: Pt will perform lower body dressing with assistance level of: (Due to safety concerns with low endurance, excessive drowsiness, R knee instability and fluctuating balance LTGs downgraded from Mod I to S.) Supervision/Verbal cueing Note: Due to safety concerns with low endurance, excessive drowsiness, R knee instability and fluctuating balance LTGs downgraded from Mod I to S   Problem: RH Toileting Goal: LTG Patient will perform toileting task (3/3 steps) with assistance level (OT) Description: LTG: Patient will perform toileting task (3/3 steps) with assistance level (OT)  Flowsheets (Taken 02/10/2023 1254) LTG: Pt will perform toileting task (3/3 steps) with assistance level: (Due to safety concerns with low endurance, excessive drowsiness, R knee instability and fluctuating balance LTGs downgraded from Mod I to S.) Supervision/Verbal cueing Note: Due to safety concerns with low endurance, excessive drowsiness, R knee instability and fluctuating balance LTGs downgraded from Mod I to S.   Problem: RH Simple Meal Prep Goal: LTG Patient will perform simple meal prep w/assist (OT) Description: LTG: Patient will perform simple meal prep with assistance, with/without cues (OT). Flowsheets (Taken 02/10/2023 1254) LTG: Pt will perform simple meal prep with assistance level of: (LTG discontinued due to safety concerns with low endurance, excessive drowsiness, R knee instability and fluctuating balance) -- Note: LTG discontinued due to safety concerns with low endurance, excessive drowsiness, R knee instability and fluctuating balance    Problem: RH Light Housekeeping Goal: LTG Patient will perform light housekeeping w/assist (OT) Description: LTG: Patient will perform light  housekeeping with assistance, with/without cues (OT). Flowsheets (Taken 02/10/2023 1254) LTG: Pt  will perform light housekeeping with assistance level of: (LTG discontinued due to safety concerns with low endurance, excessive drowsiness, R knee instability and fluctuating balance) -- Note: LTG discontinued due to safety concerns with low endurance, excessive drowsiness, R knee instability and fluctuating balance    Problem: RH Toilet Transfers Goal: LTG Patient will perform toilet transfers w/assist (OT) Description: LTG: Patient will perform toilet transfers with assist, with/without cues using equipment (OT) Flowsheets (Taken 02/10/2023 1254) LTG: Pt will perform toilet transfers with assistance level of: (Due to safety concerns with low endurance, excessive drowsiness, R knee instability and fluctuating balance LTGs downgraded from Mod I to S.) Supervision/Verbal cueing Note: Due to safety concerns with low endurance, excessive drowsiness, R knee instability and fluctuating balance LTGs downgraded from Mod I to S.   Problem: RH Tub/Shower Transfers Goal: LTG Patient will perform tub/shower transfers w/assist (OT) Description: LTG: Patient will perform tub/shower transfers with assist, with/without cues using equipment (OT) Flowsheets (Taken 02/10/2023 1254) LTG: Pt will perform tub/shower stall transfers with assistance level of: (Due to safety concerns with low endurance, excessive drowsiness, R knee instability and fluctuating balance LTGs downgraded from Mod I to S/CGA.) Contact Guard/Touching assist Note: Due to safety concerns with low endurance, excessive drowsiness, R knee instability and fluctuating balance LTGs downgraded from Mod I to S/CGA.

## 2023-02-10 NOTE — Progress Notes (Signed)
Physical Therapy Session Note  Patient Details  Name: Angela [name] MRN: [id number] Date of Birth: [birth year]  Today's Date: 02/10/2023 PT Individual Time: 1005-1058 PT Individual Time Calculation (min): 53 min   Short Term Goals: Week 1:  PT Short Term Goal 1 (Week 1): STG=LTG 2/2 ELOS  Skilled Therapeutic Interventions/Progress Updates:   Pt received supine in bed and agreeable to PT. Supine>sit transfer with mod assist at BUE to pul in to sitting due to delayed initiation of transfer.   Stand pivot transfer to [facility] with CGA and no AD. Pt transported to rehab gym.   Stand pivot transfer to and from with supervision assist with cues for decreased speed to reduce fall risk. Nustep endurance training level 5 x 8 min. Cues from PT to stop at  6 min. Pt able to recall target stop time, but continued unit 8 and said said "I think I went too long."     Dynamic gait training forward/reverse 2 x 13f each  forward, reverse while holding ball with BUE 2 x 115f forward/reverse while tossing ball vertically, dribbling basketball [id number] 2. Min assist from PT to prevent mild LOB and fall due to lateral deviation R and L.     Gait training without AD x [id number] supervision-CGA from PT with cues for step width. Additional gait training with RW through hall and distant supervision assist. And cues for posture and to keep BLE close to RW BOS performed x 165 and x [id number] returned to room and performed ambulatory transfer to bed with no AD and supervision assist. Sit>supine completed without assist, and left supine in bed with call bell in reach and all needs met.         Therapy Documentation Precautions:  Precautions Precautions: Fall, Other (comment) Precaution Comments: left side weakness, HD on MWF schedule, Restrictions Weight Bearing Restrictions: No  Vital Signs: Therapy Vitals Temp: 97.9 F (36.6 C) Pulse Rate: 91 Resp: 15 BP: 132/72 Patient Position (if appropriate):  Lying Oxygen Therapy SpO2: 98 % O2 Device: Room Air Pain: Pain Assessment Pain Scale: 0-10 Pain Score: 2    Therapy/Group: Individual Therapy  AustiLorie Phenix/2024, 11:05 AM

## 2023-02-10 NOTE — Progress Notes (Signed)
[facility] Progress Note   Subjective:    Seen in room. No new concerns. Ate lunch. For dialysis today.   Objective Vitals:   02/10/23 0442 02/10/23 0458 02/10/23 0959 02/10/23 1228  BP: (!) 157/92  132/72 (!) 168/85  Pulse: 91  91 90  Resp: '18  15 15  '$ Temp: 99 F (37.2 C)  97.9 F (36.6 C) 97.7 F (36.5 C)  TempSrc:      SpO2: 99%  98% 100%  Weight:  60.9 kg    Height:       Physical Exam General: Lying in bed, nad  Heart: RRR; no murmur Lungs: Clear bilaterally  Abdomen: soft Extremities: no LE edema Dialysis Access: LUE AVF + bruit  Filed Weights   02/08/23 2019 02/09/23 0500 02/10/23 0458  Weight: 61.5 kg 60.7 kg 60.9 kg    Intake/Output Summary (Last 24 hours) at 02/10/2023 1304 Last data filed at 02/10/2023 1225 Gross per 24 hour  Intake 531 ml  Output --  Net 531 ml     Additional Objective Labs: Basic Metabolic Panel: Recent Labs  Lab 02/04/23 0258 02/06/23 0610 02/09/23 0627  NA 132* 132* 134*  K 5.0 4.7 3.7  CL 93* 91* 96*  CO2 '25 24 28  '$ GLUCOSE 153* 111* 180*  BUN 25* 40* 13  CREATININE 4.58* 8.04* 4.82*  CALCIUM 9.8 10.1 9.7  PHOS 5.3* 6.3* 4.2    Liver Function Tests: Recent Labs  Lab 02/04/23 0258 02/06/23 0610 02/09/23 0627  ALBUMIN 2.6* 3.0* 2.9*    No results for input(s): "LIPASE", "AMYLASE" in the last 168 hours. CBC: Recent Labs  Lab 02/04/23 0258 02/06/23 0610 02/08/23 1730 02/09/23 0627  WBC 8.1 12.4* 10.7* 10.3  NEUTROABS  --   --   --  6.1  HGB 7.9* 8.9* 8.7* 8.9*  HCT 25.3* 26.9* 26.9* 27.2*  MCV 91.0 89.7 92.4 91.6  PLT 140* 219 180 162    Blood Culture    Component Value Date/Time   SDES BLOOD BLOOD LEFT HAND 01/27/2023 1836   SPECREQUEST  01/27/2023 1836    BOTTLES DRAWN AEROBIC AND ANAEROBIC Blood Culture adequate volume   CULT  01/27/2023 1836    NO GROWTH 5 DAYS Performed at [facility] [address] [place], [place] [address]    REPTSTATUS 02/01/2023 FINAL  01/27/2023 1836    Cardiac Enzymes: No results for input(s): "CKTOTAL", "CKMB", "CKMBINDEX", "TROPONINI" in the last 168 hours. CBG: Recent Labs  Lab 02/09/23 2031 02/09/23 2356 02/10/23 0438 02/10/23 0809 02/10/23 1201  GLUCAP 105* 94 193* 343* 215*    Iron Studies: No results for input(s): "IRON", "TIBC", "TRANSFERRIN", "FERRITIN" in the last 72 hours. Lab Results  Component Value Date   INR 0.96 09/21/2017   INR 1.07 08/25/2017     Medications:   amLODipine  10 mg Oral QHS   busPIRone  15 mg Oral QHS   carvedilol  3.125 mg Oral BID WC   Chlorhexidine Gluconate Cloth  6 each Topical Daily   darbepoetin (ARANESP) injection - DIALYSIS  100 mcg Subcutaneous Q Fri-1800   diltiazem  60 mg Oral Q6H   escitalopram  20 mg Oral Daily   gabapentin  100 mg Oral TID   heparin injection (subcutaneous)  5,000 Units Subcutaneous Q8H   hydrALAZINE  100 mg Oral Q8H   insulin aspart  0-6 Units Subcutaneous TID WC   insulin aspart  1 Units Subcutaneous TID WC   insulin glargine-yfgn  12  Units Subcutaneous Daily   lanthanum  1,000 mg Oral TID with meals   levETIRAcetam  500 mg Oral BID   levothyroxine  175 mcg Oral q morning   multivitamin  1 tablet Oral QHS   pantoprazole  40 mg Oral Daily   senna-docusate  1 tablet Oral BID    Dialysis Orders: MWF [facility] [address] 3.5hr, EDW 62kg, 2K/2.5Ca bath, LUE AVF, Prev was getting Hep 2600 + 500 u/hr - now held  Assessment/Plan: 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 Phos improving, continue lanthanum as binder Acute hypoxic respiratory failure: Secondary to altered mental status in the setting of intracranial bleed.  Resolved/extubated and remains on room air without supplemental  oxygen. Hypertension: BP remains elevated, noted coreg was added today, continue amlodipine + diltiazem. Monitor trend and titrate up if indicated. Dispo: dc date plan for 2/27. Apparently patient's Mother is requesting her to be transferred to another HD clinic in [place]; however, already discussed she will need to imitate that request at patient primary outpatient HD center.   Lynnda Child PA-C [facility] 02/10/2023,1:05 PM

## 2023-02-10 NOTE — Procedures (Signed)
Modified Barium Swallow Study  Patient Details  Name: Angela [name] MRN: [id number] Date of Birth: [birth year]  Today's Date: 02/10/2023  Modified Barium Swallow completed.  Full report located under Chart Review in the Imaging Section.  History of Present Illness Pt is a 37 y.o. female who presented from dialysis center for evaluation of AMS. Pt was found obtunded, and she was intubated. ETT 2/9-2/12. CT head 2/9: intraparenchymal hematoma centered at the anterior left basal ganglia with intraventricular extension. Pt s/p EVD 2/9; clamped 2/15 and removed 2/16. Pt removed Cortrak 2/16. PMH: ESRD on HD, DM, HTN, seizures, HLD, GERD, hypothyroidism, anxiety and depression.  Clinical Impression Patient presents with a mild oropharyngeal dysphagia and pharyngoesophageal component observed with barium tablet resulting in esophageal retention near the level of the clavicles. Patient was eventually able to clear tablet with additional thin liquid rinses and secondary swallows. Oral deficits are characterized by posterior escape to the pyriforms with thin liquids and delayed bolus transport. Pharyngeal deficits are primarily characterized by reduced tongue base retraction. These functional deficits resulted in trace oral and pharyngeal residuals post swallow in which patient sensed and independently executed secondary swallows to effectively clear. Patient had flash penetration (PAS 2) x1 with thin liquid bolus via straw when consuming barium tablet, likely attributed to mixed consistency. Otherwise, patient had excellent airway protection and had no other penetration or aspiration events among any tested consistency even when challenged with large and sequential sips via both cup and straw. SLP recommends a regular diet with thin liquids, pills whole in puree. Swallowing compensatory strategies to include small bites/sips, slow rate, upright positioning during and 30 minutes following meals, and avoidance of  mixed consistencies. Recommend full supervision to ensure patient is alert and able to implement swallowing precautions/strategies. Results communicated to patient, nurse, and NT. All parties verbalized understanding and agreement with plan.  Factors that may increase risk of adverse event in presence of aspiration ([place] 2021): Reduced cognitive function;Poor general health and/or compromised immunity  Swallow Evaluation Recommendations Recommendations: PO diet PO Diet Recommendation: Regular;Thin liquids (Level 0) Liquid Administration via: Straw;Cup Medication Administration: Whole meds with puree Supervision: Patient able to self-feed;Full supervision/cueing for swallowing strategies; set-up assist for safety Swallowing strategies : Slow rate;Small bites/sips;Avoid mixed consistencies Postural changes: Position pt fully upright for meals;Stay upright 30-60 min after meals Oral care recommendations: Oral care BID (2x/day)   [name] [name] 02/10/2023,12:33 PM

## 2023-02-10 NOTE — Progress Notes (Signed)
Occupational Therapy Session Note  Patient Details  Name: Angela [name] MRN: [id number] Date of Birth: 05-May-1986  Today's Date: 02/10/2023 OT Individual Time: [id number] OT Individual Time Calculation (min): 40 min    Short Term Goals: Week 1:  OT Short Term Goal 1 (Week 1): STGs=LTGs due to patient's length of stay.  Skilled Therapeutic Interventions/Progress Updates:  Pt greeted asleep in supine needing max environmental cues to arouse as NT also present to check blood sugar.  Pt  did eventually sit EOB with CGA for seated grooming tasks with set- up assist. Pt donned shoes and socks from EOB with set- up assist. Pt then lays self back down, required 10 mins of convincing from both therapist and MD to to return to EOB.   Pt completed stand pivot to w/c to R side with no AD and CGA, total A transport in w/c ot gym. Pt did reports that she likes R&B music, played music with pt staying awake and attending more to session than during previous sessions.   9 Hole Peg Test is used to measure finger dexterity in pts with various neurological diagnoses. - Instructions The pt was instructed to pick up the pegs one at a time, using their dominant hand first and put them into the holes in any order until the holes were all filled. The pt then removed the pegs one at a time and returned them to the container. Both hands were tested separately.  - Results The pt completed the test in 22 seconds on R side and 31 seconds on L side. Scores are based on the time taken to complete the activity. The timer started the moment the pt touched the first peg until the moment the last peg hit the container.  After completing assessment pt sat in w/c and kept eyes closed but silently danced in chair to new beyonce music. Pt did c/o pain in  back, completed seated stretches from w/c including spinal twist to R and L side, lateral flexion to R<>L, scpaular protraction/retraction.  Ended session with pt supine  in bed with all needs within reach and bed alarm activated.     Therapy Documentation Precautions:  Precautions Precautions: Fall, Other (comment) Precaution Comments: left side weakness, HD on MWF schedule, Restrictions Weight Bearing Restrictions: No  Pain: unrated pain reported in back, stretching provided for pain mgmt.     Therapy/Group: Individual Therapy  Corinne Ports [facility] 02/10/2023, 11:52 AM

## 2023-02-10 NOTE — Progress Notes (Signed)
CBG recheck 85, patient in bed resting, call bell in reach.

## 2023-02-10 NOTE — Progress Notes (Signed)
PROGRESS NOTE   Subjective/Complaints:  Elevated CBG this am , had breakfast with staff assist, pt denies snacks   ROS: denies shortness of breath  Objective:   No results found. Recent Labs    02/08/23 1730 02/09/23 0627  WBC 10.7* 10.3  HGB 8.7* 8.9*  HCT 26.9* 27.2*  PLT 180 162    Recent Labs    02/09/23 0627  NA 134*  K 3.7  CL 96*  CO2 28  GLUCOSE 180*  BUN 13  CREATININE 4.82*  CALCIUM 9.7     Intake/Output Summary (Last 24 hours) at 02/10/2023 0820 Last data filed at 02/09/2023 1741 Gross per 24 hour  Intake 236 ml  Output --  Net 236 ml         Physical Exam: Vital Signs Blood pressure (!) 157/92, pulse 91, temperature 99 F (37.2 C), resp. rate 18, height 5' 4.96" (1.65 m), weight 60.9 kg, SpO2 99 %.  Drowsy but awakens to physical stim and sits unsupported at EOB General: No acute distress Mood and affect are appropriate Heart: Regular rate and rhythm no rubs murmurs or extra sounds Lungs: Clear to auscultation, breathing unlabored, no rales or wheezes Abdomen: Positive bowel sounds, soft nontender to palpation, nondistended Extremities: No clubbing, cyanosis, or edema MSK:  RIght knee without abrasion or ecchymosis , no pain to palp or with ROM   Skin:LUE AVF Neuro: 4/5 throughout, sensation is intact. Can follow commands. Impaired safety awareness. Can recall 2/3 objects. Hypophonic. Sensation intact   Assessment/Plan: 1. Functional deficits which require 3+ hours per day of interdisciplinary therapy in a comprehensive inpatient rehab setting. Physiatrist is providing close team supervision and 24 hour management of active medical problems listed below. Physiatrist and rehab team continue to assess barriers to discharge/monitor patient progress toward functional and medical goals  Care Tool:  Bathing    Body parts bathed by patient: Right arm, Left arm, Chest, Abdomen, Front  perineal area, Buttocks, Right upper leg, Left upper leg, Right lower leg, Left lower leg, Face         Bathing assist Assist Level: Set up assist     Upper Body Dressing/Undressing Upper body dressing   What is the patient wearing?: Pull over shirt    Upper body assist Assist Level: Set up assist    Lower Body Dressing/Undressing Lower body dressing      What is the patient wearing?: Pants     Lower body assist Assist for lower body dressing: Set up assist     Toileting Toileting Toileting Activity did not occur (Clothing management and hygiene only): N/A (no void or bm)  Toileting assist Assist for toileting: Contact Guard/Touching assist     Transfers Chair/bed transfer  Transfers assist     Chair/bed transfer assist level: Contact Guard/Touching assist     Locomotion Ambulation   Ambulation assist      Assist level: Minimal Assistance - Patient > 75% Assistive device: Other (comment) (at gait belt and L shoulder) Max distance: 161   Walk 10 feet activity   Assist     Assist level: Minimal Assistance - Patient > 75% Assistive device: Other (comment) (at gait belt  and L shoulder)   Walk 50 feet activity   Assist    Assist level: Minimal Assistance - Patient > 75% Assistive device: Other (comment) (at gait belt and L shoulder)    Walk 150 feet activity   Assist    Assist level: Minimal Assistance - Patient > 75% Assistive device: Other (comment) (at gait belt and L shoulder)    Walk 10 feet on uneven surface  activity   Assist     Assist level: Minimal Assistance - Patient > 75% (ramp) Assistive device: Other (comment) (at gait belt and L shoulder and does reach for external support if available.)   Wheelchair     Assist Is the patient using a wheelchair?: Yes Type of Wheelchair: Manual    Wheelchair assist level: Supervision/Verbal cueing Max wheelchair distance: 100    Wheelchair 50 feet with 2 turns  activity    Assist    Wheelchair 50 feet with 2 turns activity did not occur:  (per PT documentation)   Assist Level: Supervision/Verbal cueing   Wheelchair 150 feet activity     Assist      Assist Level: Moderate Assistance - Patient 50 - 74% (per PT documentation of 100 feet max distance)   Blood pressure (!) 157/92, pulse 91, temperature 99 F (37.2 C), resp. rate 18, height 5' 4.96" (1.65 m), weight 60.9 kg, SpO2 99 %.  Medical Problem List and Plan: 1. Functional deficits secondary to [place] left caudate with resolving IVH - uncontrolled              -patient may shower             -ELOS/Goals: 02/14/23 S               2.  Antithrombotics: -DVT/anticoagulation:  Pharmaceutical: Heparin             -antiplatelet therapy: none   3. Pain Management: Tylenol as needed   4. Mood/Behavior/Sleep: LCSW to evaluate and provide emotional support             -depression/anxiety: continue Buspar and Lexapro             -antipsychotic agents: n/a   5. Neuropsych/cognition: This patient not capable of making decisions on her own behalf.   6. Skin/Wound Care: Routine skin care checks   7. Fluids/Electrolytes/Nutrition: strict Is and Os; chemistries as per nephrology             -TF d/c ed             -continue Fosrenol, MVI, Aranesp   8: Hypertension: monitor TID and prn (home Lasix, losartan held>>other home meds restarted             -continue amlodipine 10 mg daily             -diltiazem 60 mg TID             -hydralazine 100 mg TID     Vitals:   02/09/23 1953 02/10/23 0442  BP: (!) 155/90 (!) 157/92  Pulse: 88 91  Resp: 19 18  Temp: 98.5 F (36.9 C) 99 F (37.2 C)  SpO2: 99% 99%  Labile pulse mildly elevated and BP still up will start low dose coreg, may titrate this up and reduce diltiazem  9: Hyperlipidemia: home statin held   10: Hydrocephalus: resolved; EVD placed 2/09, removed 2/16   11: ESRD: HD on M/W/F via LUE AVF   12: 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 Sensitive sliding scale changed to very sensitive given CBG of 32 on admission -decrease Semglee to 12 units daily -reduce novalog to  1U TID novolog with meals as per diabetic coordinator recommendations. Hold if po intake <50%  CBG (last 3)  Recent Labs    02/09/23 2356 02/10/23 0438 02/10/23 0809  GLUCAP 94 193* 343*   Uncontrolled , meal intake ~50%, 0809 CBG post prandial, has received ~4U novalog per day on SSI    13: Seizure disorder: home dose Keppra resumed             continue Keppra 500 mg BID             -gabapentin 100 mg TID  may be contributing to drowsiness 14: Hypothyroidism: continue Synthroid   15: Anemia, chronic disease: s/p 1 unit PCs, follow-up CBC    LOS: 6 days A FACE TO FACE EVALUATION WAS PERFORMED  Charlett Blake 02/10/2023, 8:20 AM

## 2023-02-10 NOTE — Progress Notes (Signed)
Received patient in bed to unit.  Alert and oriented.  Informed consent signed and in chart.   TX duration:1.1 hours  Patient tolerated well.  Transported back to the room  Alert, without acute distress.  Hand-off given to patient's nurse.   Access used: avf Access issues: pt started clotting on the circuit with 50 minutes left told patient about restarting and patient refused.    Total UF removed: 1100 Medication(s) given: none Post HD VS: see table below Post HD weight: 60.7kg   02/10/23 1929  Vitals  BP (!) 159/84  MAP (mmHg) 106  BP Location Right Arm  BP Method Automatic  Patient Position (if appropriate) Lying  Pulse Rate 87  Pulse Rate Source Monitor  ECG Heart Rate 87  Resp (!) 9  Oxygen Therapy  SpO2 100 %  O2 Device Room Air  During Treatment Monitoring  Intra-Hemodialysis Comments Tolerated well;See progress note  Post Treatment  Dialyzer Clearance Heavily streaked  Duration of HD Treatment -hour(s) 2.1 hour(s)  Hemodialysis Intake (mL) 0 mL  Liters Processed 48.9  Fluid Removed (mL) 1100 mL  Tolerated HD Treatment Yes  Post-Hemodialysis Comments see notes  AVG/AVF Arterial Site Held (minutes) 8 minutes  AVG/AVF Venous Site Held (minutes) 8 minutes  Fistula / Graft Left Upper arm  No placement date or time found.   Placed prior to admission: Yes  Orientation: Left  Access Location: Upper arm  Site Condition No complications  Fistula / Graft Assessment Present;Thrill;Bruit  Status Deaccessed;Flushed;Patent  Drainage Description None      [facility]

## 2023-02-10 NOTE — Progress Notes (Signed)
Occupational Therapy Session Note  Patient Details  Name: Angela [name] MRN: [id number] Date of Birth: [birth year]  Today's Date: 02/10/2023 OT Individual Time: 1105-1200 OT Individual Time Calculation (min): 55 min    Short Term Goals: Week 1:  OT Short Term Goal 1 (Week 1): STGs=LTGs due to patient's length of stay.  Skilled Therapeutic Interventions/Progress Updates:    Pt received sound asleep in bed.  She needed loud prompting to wake up and ultimately max A to move to EOB just to get her to wake up. Pt sat for 2 minutes and then flopped to her R side on the bed.  Again max A to sit up.  Strongly encouraged her to try to wake up as she had missed other therapy sessions.  Pt then completed stand pivot to Wc with CGA.  Tried to encourage her to shower but pt declined as she was already dressed for the day.   Pt did agree to go out of the room for a "change of scenery".  Pt taken to first floor lobby.  It was too cold to go outside but opened door partially so she could get some fresh air. Pt then taken to gift shop. Pt ambulated around gift shop looking at items with CGA for balance. She was able to ambulate for 5 minutes before getting tired.   Pt taken to piano and played a few songs. Pt used to be a Therapist, nutritional and write music.  She commented "boy it has been a long time" when playing piano. She seemed to really enjoy it.  Talked about her getting a keyboard for home to engage in activities she really enjoys.   Pt then returned to unit to use arm bike in gym at resistance 4 for 2 min then resistance 8 for 2 min.   Standing at [facility] with light CGA for balance as she worked on a memory and a rhythm game.    Pt taken back to room and then she ambulated the last 50 ft with CGA to her bed. Pt got in bed right way.  She asked for a cup of ice. She was asleep before I returned to the room with the ice in 1 minute.  Bed alarm set.  Due to safety concerns with low endurance, excessive drowsiness, R  knee instability and fluctuating balance LTGs downgraded from Mod I to S/CGA.  Therapy Documentation Precautions:  Precautions Precautions: Fall, Other (comment) Precaution Comments: left side weakness, HD on MWF schedule, Restrictions Weight Bearing Restrictions: No Therapy Vitals Temp: 97.7 F (36.5 C) Pulse Rate: 90 Resp: 15 BP: (Abnormal) 168/85 Patient Position (if appropriate): Lying Oxygen Therapy SpO2: 100 % O2 Device: Room Air Pain:  No c/o pain     Therapy/Group: Individual Therapy  Marinus Eicher 02/10/2023, 12:42 PM

## 2023-02-10 NOTE — Progress Notes (Signed)
Hypoglycemic Event  CBG: 65  Treatment: Patient just arrived from dialysis, currently eating her dinner tray   Symptoms: None  Follow-up CBG: Time: 2030   Possible Reasons for Event: Unknown  Comments/MD notified: Will recheck in 30 mins    [employer]

## 2023-02-11 ENCOUNTER — Inpatient Hospital Stay (HOSPITAL_COMMUNITY): Payer: Medicaid Other

## 2023-02-11 ENCOUNTER — Encounter (HOSPITAL_COMMUNITY): Payer: Self-pay | Admitting: Physical Medicine & Rehabilitation

## 2023-02-11 DIAGNOSIS — R2990 Unspecified symptoms and signs involving the nervous system: Secondary | ICD-10-CM

## 2023-02-11 DIAGNOSIS — I615 Nontraumatic intracerebral hemorrhage, intraventricular: Secondary | ICD-10-CM

## 2023-02-11 LAB — COMPREHENSIVE METABOLIC PANEL
ALT: 14 U/L (ref 0–44)
AST: 26 U/L (ref 15–41)
Albumin: 3 g/dL — ABNORMAL LOW (ref 3.5–5.0)
Alkaline Phosphatase: 102 U/L (ref 38–126)
Anion gap: 9 (ref 5–15)
BUN: 15 mg/dL (ref 6–20)
CO2: 29 mmol/L (ref 22–32)
Calcium: 9.8 mg/dL (ref 8.9–10.3)
Chloride: 92 mmol/L — ABNORMAL LOW (ref 98–111)
Creatinine, Ser: 5.77 mg/dL — ABNORMAL HIGH (ref 0.44–1.00)
GFR, Estimated: 9 mL/min — ABNORMAL LOW (ref >60)
Glucose, Bld: 76 mg/dL (ref 70–99)
Potassium: 3.7 mmol/L (ref 3.5–5.1)
Sodium: 130 mmol/L — ABNORMAL LOW (ref 135–145)
Total Bilirubin: 0.6 mg/dL (ref 0.3–1.2)
Total Protein: 6 g/dL — ABNORMAL LOW (ref 6.5–8.1)

## 2023-02-11 LAB — CBC WITH DIFFERENTIAL/PLATELET
Abs Immature Granulocytes: 0.16 10*3/uL — ABNORMAL HIGH (ref 0.00–0.07)
Basophils Absolute: 0.1 10*3/uL (ref 0.0–0.1)
Basophils Relative: 0 %
Eosinophils Absolute: 0.4 10*3/uL (ref 0.0–0.5)
Eosinophils Relative: 4 %
HCT: 26.5 % — ABNORMAL LOW (ref 36.0–46.0)
Hemoglobin: 8.7 g/dL — ABNORMAL LOW (ref 12.0–15.0)
Immature Granulocytes: 1 %
Lymphocytes Relative: 19 %
Lymphs Abs: 2.1 10*3/uL (ref 0.7–4.0)
MCH: 30.2 pg (ref 26.0–34.0)
MCHC: 32.8 g/dL (ref 30.0–36.0)
MCV: 92 fL (ref 80.0–100.0)
Monocytes Absolute: 0.7 10*3/uL (ref 0.1–1.0)
Monocytes Relative: 6 %
Neutro Abs: 7.7 10*3/uL (ref 1.7–7.7)
Neutrophils Relative %: 70 %
Platelets: 173 10*3/uL (ref 150–400)
RBC: 2.88 MIL/uL — ABNORMAL LOW (ref 3.87–5.11)
RDW: 15.9 % — ABNORMAL HIGH (ref 11.5–15.5)
WBC: 11.1 10*3/uL — ABNORMAL HIGH (ref 4.0–10.5)
nRBC: 0.2 % (ref 0.0–0.2)

## 2023-02-11 LAB — URINALYSIS, ROUTINE W REFLEX MICROSCOPIC
Bilirubin Urine: NEGATIVE
Glucose, UA: 50 mg/dL — AB
Hgb urine dipstick: NEGATIVE
Ketones, ur: NEGATIVE mg/dL
Nitrite: NEGATIVE
Protein, ur: >=300 mg/dL — AB
Specific Gravity, Urine: 1.011 (ref 1.005–1.030)
pH: 8 (ref 5.0–8.0)

## 2023-02-11 LAB — GLUCOSE, CAPILLARY
Glucose-Capillary: 135 mg/dL — ABNORMAL HIGH (ref 70–99)
Glucose-Capillary: 154 mg/dL — ABNORMAL HIGH (ref 70–99)
Glucose-Capillary: 84 mg/dL (ref 70–99)
Glucose-Capillary: 125 mg/dL — ABNORMAL HIGH (ref 70–99)
Glucose-Capillary: 45 mg/dL — ABNORMAL LOW (ref 70–99)
Glucose-Capillary: 53 mg/dL — ABNORMAL LOW (ref 70–99)
Glucose-Capillary: 78 mg/dL (ref 70–99)
Glucose-Capillary: 80 mg/dL (ref 70–99)
Glucose-Capillary: 134 mg/dL — ABNORMAL HIGH (ref 70–99)
Glucose-Capillary: 207 mg/dL — ABNORMAL HIGH (ref 70–99)

## 2023-02-11 MED ORDER — BUTALBITAL-APAP-CAFFEINE 50-325-40 MG PO TABS
2.0000 | ORAL_TABLET | Freq: Four times a day (QID) | ORAL | Status: DC | PRN
  Administered 2023-02-11: 2 via ORAL
  Filled 2023-02-11: qty 2

## 2023-02-11 MED ORDER — LIDOCAINE 5 % EX PTCH
1.0000 | MEDICATED_PATCH | CUTANEOUS | Status: DC
  Administered 2023-02-11 – 2023-02-14 (×4): 1 via TRANSDERMAL
  Filled 2023-02-11 (×4): qty 1

## 2023-02-11 MED ORDER — INSULIN GLARGINE-YFGN 100 UNIT/ML ~~LOC~~ SOLN
8.0000 [IU] | Freq: Every day | SUBCUTANEOUS | Status: DC
  Administered 2023-02-12: 8 [IU] via SUBCUTANEOUS
  Filled 2023-02-11 (×2): qty 0.08

## 2023-02-11 MED ORDER — GLUCAGON HCL RDNA (DIAGNOSTIC) 1 MG IJ SOLR
INTRAMUSCULAR | Status: AC
  Filled 2023-02-11: qty 1

## 2023-02-11 MED ORDER — PHENOL 1.4 % MT LIQD
1.0000 | OROMUCOSAL | Status: DC | PRN
  Administered 2023-02-11: 1 via OROMUCOSAL
  Filled 2023-02-11: qty 177

## 2023-02-11 MED ORDER — CARVEDILOL 6.25 MG PO TABS
6.2500 mg | ORAL_TABLET | Freq: Two times a day (BID) | ORAL | Status: DC
  Administered 2023-02-11 – 2023-02-16 (×9): 6.25 mg via ORAL
  Filled 2023-02-11 (×9): qty 1

## 2023-02-11 MED ORDER — DEXTROSE 50 % IV SOLN
25.0000 g | INTRAVENOUS | Status: AC
  Administered 2023-02-11: 25 g via INTRAVENOUS

## 2023-02-11 MED ORDER — ACETAMINOPHEN 325 MG PO TABS
650.0000 mg | ORAL_TABLET | Freq: Four times a day (QID) | ORAL | Status: DC | PRN
  Administered 2023-02-12 – 2023-02-15 (×6): 650 mg via ORAL
  Filled 2023-02-11 (×7): qty 2

## 2023-02-11 NOTE — Progress Notes (Signed)
PT reports patient had a vomiting episode during session. Patient brought back to unit and safely transferred back into bed by PT. VSS, alert but lethargic more than baseline lethargy. Patent also reported ongoing headache. MD Notified. MD Tressa Busman) Notified. New orders placed. Orders implemented as follows. CT results given to MD Tressa Busman), per MD call code stroke. Code stroke activated. New orders placed. X2 Hypoglycemic events; MD Tressa Busman) aware,  Awaiting MRI Results, notified oncoming nurse.  Yehuda Mao, LPN

## 2023-02-11 NOTE — Progress Notes (Signed)
[facility] Progress Note   Subjective:    Completed dialysis yesterday. Net UF 1.1L   Seen in  room. Per nursing has been more lethargic than usual. Head CT has been ordered.   Objective Vitals:   02/11/23 0500 02/11/23 1000 02/11/23 1135 02/11/23 1152  BP:  (!) 133/91 (!) 149/88   Pulse:  85 81   Resp:  17 17   Temp:  98.4 F (36.9 C) 98.4 F (36.9 C)   TempSrc:  Oral Oral   SpO2:   99% 100%  Weight: 67.6 kg     Height:       Physical Exam General: Lying in bed, nad  Heart: RRR; no murmur Lungs: Clear bilaterally  Abdomen: soft Extremities: no LE edema Dialysis Access: LUE AVF + bruit  Filed Weights   02/10/23 0458 02/10/23 1942 02/11/23 0500  Weight: 60.9 kg 60.7 kg 67.6 kg    Intake/Output Summary (Last 24 hours) at 02/11/2023 1232 Last data filed at 02/11/2023 0950 Gross per 24 hour  Intake 714 ml  Output 1100 ml  Net -386 ml     Additional Objective Labs: Basic Metabolic Panel: Recent Labs  Lab 02/06/23 0610 02/09/23 0627  NA 132* 134*  K 4.7 3.7  CL 91* 96*  CO2 24 28  GLUCOSE 111* 180*  BUN 40* 13  CREATININE 8.04* 4.82*  CALCIUM 10.1 9.7  PHOS 6.3* 4.2    Liver Function Tests: Recent Labs  Lab 02/06/23 0610 02/09/23 0627  ALBUMIN 3.0* 2.9*    No results for input(s): "LIPASE", "AMYLASE" in the last 168 hours. CBC: Recent Labs  Lab 02/06/23 0610 02/08/23 1730 02/09/23 0627 02/11/23 1207  WBC 12.4* 10.7* 10.3 11.1*  NEUTROABS  --   --  6.1 7.7  HGB 8.9* 8.7* 8.9* 8.7*  HCT 26.9* 26.9* 27.2* 26.5*  MCV 89.7 92.4 91.6 92.0  PLT 219 180 162 173    Blood Culture    Component Value Date/Time   SDES BLOOD BLOOD LEFT HAND 01/27/2023 1836   SPECREQUEST  01/27/2023 1836    BOTTLES DRAWN AEROBIC AND ANAEROBIC Blood Culture adequate volume   CULT  01/27/2023 1836    NO GROWTH 5 DAYS Performed at [facility] [address] [place], [place] [address]    REPTSTATUS 02/01/2023 FINAL 01/27/2023 1836     Cardiac Enzymes: No results for input(s): "CKTOTAL", "CKMB", "CKMBINDEX", "TROPONINI" in the last 168 hours. CBG: Recent Labs  Lab 02/10/23 2037 02/10/23 2248 02/11/23 0437 02/11/23 0754 02/11/23 1140  GLUCAP 84 169* 135* 154* 84    Iron Studies: No results for input(s): "IRON", "TIBC", "TRANSFERRIN", "FERRITIN" in the last 72 hours. Lab Results  Component Value Date   INR 0.96 09/21/2017   INR 1.07 08/25/2017     Medications:   amLODipine  10 mg Oral QHS   busPIRone  15 mg Oral QHS   carvedilol  6.25 mg Oral BID WC   Chlorhexidine Gluconate Cloth  6 each Topical Daily   darbepoetin (ARANESP) injection - DIALYSIS  100 mcg Subcutaneous Q Fri-1800   diltiazem  60 mg Oral Q6H   escitalopram  20 mg Oral Daily   gabapentin  100 mg Oral TID   heparin injection (subcutaneous)  5,000 Units Subcutaneous Q8H   hydrALAZINE  100 mg Oral Q8H   insulin aspart  0-6 Units Subcutaneous TID WC   insulin aspart  1 Units Subcutaneous TID WC   insulin glargine-yfgn  12 Units Subcutaneous Daily  lanthanum  1,000 mg Oral TID with meals   levETIRAcetam  500 mg Oral BID   levothyroxine  175 mcg Oral q morning   multivitamin  1 tablet Oral QHS   pantoprazole  40 mg Oral Daily   senna-docusate  1 tablet Oral BID    Dialysis Orders: MWF [facility] [address] 3.5hr, EDW 62kg, 2K/2.5Ca bath, LUE AVF, Prev was getting Hep 2600 + 500 u/hr - now held  Assessment/Plan: 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 More lethargic today. Head CT ordered by primary team.  ESRD: Continue HD on MWF schedule - next HD 2/26, no added heparin. 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 Phos improving, continue lanthanum as binder Acute hypoxic respiratory failure: Extubated. Resolved.  Hypertension: BP up. On diltiazem, amlodipine, hydralazine. Volume ok.  Dispo: dc date plan for  2/27. Apparently patient's Mother is requesting her to be transferred to another HD clinic in [place]; however, already discussed she will need to imitate that request at patient primary outpatient HD center.   Lynnda Child PA-C [facility] 02/11/2023,12:32 PM

## 2023-02-11 NOTE — Progress Notes (Signed)
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   Wheelchair 150 feet activity     Assist      Assist Level: Moderate Assistance - Patient 50 - 74% (per PT documentation of 100 feet max distance)   Blood pressure (!) 149/77, pulse 85, temperature 98.6 F (37 C), resp. rate 16, height 5' 4.96" (1.65 m), weight 67.6 kg, SpO2 99 %.  Medical Problem List and Plan: 1. Functional deficits secondary to [place] left caudate with resolving IVH - uncontrolled              -patient may shower             -ELOS/Goals: 02/14/23 S            -Defer to primary team patient appropriateness for discharge Tuesday, family extremely concerned regarding her ongoing lethargy   2.  Antithrombotics: -DVT/anticoagulation:  Pharmaceutical: Heparin             -antiplatelet therapy: none   3. Pain Management: Tylenol as needed -2-24: Patient reported persistent headache to mom, overnight staff.  Trial Fioricet 2 tabs 3 times daily as needed, received 1 dose, switched back to Tylenol due to concern for worsening lethargy from barbital portion of Fioricet   4. Mood/Behavior/Sleep: LCSW to evaluate and provide emotional support              -depression/anxiety: continue Buspar and Lexapro             -antipsychotic agents: n/a   5. Neuropsych/cognition: This patient not capable of making decisions on her own behalf.   -May benefit from Ritalin or modafinil to increase arousal, however as she does seem to arouse enough to participate in therapies and is close to discharge, will defer this to primary team  6. Skin/Wound Care: Routine skin care checks   7. Fluids/Electrolytes/Nutrition: strict Is and Os; chemistries as per nephrology             -TF d/c ed             -continue Fosrenol, MVI, Aranesp   8: Hypertension: monitor TID and prn (home Lasix, losartan held>>other home meds restarted             -continue amlodipine 10 mg daily             -diltiazem 60 mg TID             -hydralazine 100 mg TID     Vitals:   02/11/23 1559 02/11/23 2015  BP: (!) 148/67 (!) 149/77  Pulse: 79 85  Resp: 20 16  Temp: 98.7 F (37.1 C) 98.6 F (37 C)  SpO2: 100% 99%  Labile pulse mildly elevated and BP still up will start low dose coreg, may titrate this up and reduce diltiazem - 2/24: Pulse consistently in 80s to 90s, increase Coreg for blood pressure control with improvements this afternoon  9: Hyperlipidemia: home statin held   10: Hydrocephalus: resolved; EVD placed 2/09, removed 2/16 -CT head, MRI stable 2/24   11: ESRD: HD on M/W/F via LUE AVF   12: DM-1; uncontolled: continue CBGs q 4 hours. Sensitive sliding scale changed to very sensitive given CBG of 32 on admission -decrease Semglee to 12 units daily -> 8U  daily due to persistent hypoglycemia and recurrent hypoglycemic episodes  -reduce novalog to  1U TID novolog with meals as per diabetic coordinator recommendations. Hold if po intake <50%   CBG (last 3)  Recent Labs    02/11/23 1702 02/11/23 1833 02/11/23 2018  GLUCAP 80 134* 207*   Uncontrolled , meal intake ~50%, 0809 CBG post prandial, has received ~4U novalog per day on SSI    13: Seizure disorder: home  dose Keppra resumed             continue Keppra 500 mg BID             -gabapentin 100 mg TID-unable to increase due to creatinine clearance  may be contributing to drowsiness  -will hold off on medication changes that may reduce seizure threshold at this time  14: Hypothyroidism: continue Synthroid   15: Anemia, chronic disease: s/p 1 unit PCs, follow-up CBC    LOS: 7 days A FACE TO FACE EVALUATION WAS PERFORMED  Gertie Gowda 02/11/2023, 9:08 PM

## 2023-02-11 NOTE — Progress Notes (Signed)
Pt mother at bedside stated that a nurse from a previous shift informed her about some "pill" for people that has massive headaches after strokes. She was not sure what the pill is called but wanted this nurse to leave a note to notify the doctor this morning. She stated "she needs something stronger." The patient was also complaining of a sore throat. Mother interested in getting the patient some throat stray or cough drops to have prn. Mother stated "She will be okay until morning, I just wanted to make someone aware because she tells me these things as if I can do something for her. I told her that she has to speak up when something is wrong but she feels like they will extend her stay and not let her discharge Wednesday."  No further concerns at this time, call bell in reach.

## 2023-02-11 NOTE — Plan of Care (Signed)
  Problem: RH Balance Goal: LTG Patient will maintain dynamic standing balance (PT) Description: LTG:  Patient will maintain dynamic standing balance with assistance during mobility activities (PT) Flowsheets (Taken 02/11/2023 1839) LTG: Pt will maintain dynamic standing balance during mobility activities with:: Supervision/Verbal cueing Note: Downgraded due to continued lethargy    Problem: Sit to Stand Goal: LTG:  Patient will perform sit to stand with assistance level (PT) Description: LTG:  Patient will perform sit to stand with assistance level (PT) Flowsheets (Taken 02/11/2023 1839) LTG: PT will perform sit to stand in preparation for functional mobility with assistance level: Supervision/Verbal cueing Note: Downgraded due to continued lethargy    Problem: RH Bed Mobility Goal: LTG Patient will perform bed mobility with assist (PT) Description: LTG: Patient will perform bed mobility with assistance, with/without cues (PT). Flowsheets (Taken 02/11/2023 1839) LTG: Pt will perform bed mobility with assistance level of: Supervision/Verbal cueing Note: Downgraded due to continued lethargy    Problem: RH Bed to Chair Transfers Goal: LTG Patient will perform bed/chair transfers w/assist (PT) Description: LTG: Patient will perform bed to chair transfers with assistance (PT). Flowsheets (Taken 02/11/2023 1839) LTG: Pt will perform Bed to Chair Transfers with assistance level: Supervision/Verbal cueing Note: Downgraded due to continued lethargy    Problem: RH Car Transfers Goal: LTG Patient will perform car transfers with assist (PT) Description: LTG: Patient will perform car transfers with assistance (PT). Flowsheets (Taken 02/11/2023 1839) LTG: Pt will perform car transfers with assist:: Supervision/Verbal cueing Note: Downgraded due to continued lethargy    Problem: RH Furniture Transfers Goal: LTG Patient will perform furniture transfers w/assist (OT/PT) Description: LTG: Patient will  perform furniture transfers  with assistance (OT/PT). Flowsheets (Taken 02/11/2023 1839) LTG: Pt will perform furniture transfers with assist:: Supervision/Verbal cueing Note: Downgraded due to continued lethargy    Problem: RH Floor Transfers Goal: LTG Patient will perform floor transfers w/assist (PT) Description: LTG: Patient will perform floor transfers with assistance (PT). Flowsheets (Taken 02/11/2023 1839) LTG: PT WILL PERFORM FLOOR TRANFERS  WITH  ASSIST:: Minimal Assistance - Patient > 75% Note: Downgraded due to continued lethargy    Problem: RH Ambulation Goal: LTG Patient will ambulate in controlled environment (PT) Description: LTG: Patient will ambulate in a controlled environment, # of feet with assistance (PT). Flowsheets (Taken 02/11/2023 1839) LTG: Pt will ambulate in controlled environ  assist needed:: Supervision/Verbal cueing LTG: Ambulation distance in controlled environment: 145f with LRAD Goal: LTG Patient will ambulate in home environment (PT) Description: LTG: Patient will ambulate in home environment, # of feet with assistance (PT). Flowsheets (Taken 02/11/2023 1839) LTG: Pt will ambulate in home environ  assist needed:: Supervision/Verbal cueing LTG: Ambulation distance in home environment: 515fwith LRAD Note: Downgraded due to continued lethargy    Problem: RH Stairs Goal: LTG Patient will ambulate up and down stairs w/assist (PT) Description: LTG: Patient will ambulate up and down # of stairs with assistance (PT) Flowsheets Taken 02/11/2023 1839 by TuLorie PhenixPT LTG: Pt will ambulate up/down stairs assist needed:: Supervision/Verbal cueing Taken 02/05/2023 1609 by VaLadoris GenePT LTG: Pt will  ambulate up and down number of stairs: 12 Note: Downgraded due to continued lethargy

## 2023-02-11 NOTE — Code Documentation (Signed)
Stroke Response Nurse Documentation Code Documentation  Angela [name] is a 37 y.o. female admitted to [facility] on 02/04/23 for [place] with past medical hx of CKD on HD, DM, HLD, HTN. On No antithrombotic. Code stroke was activated by 4W .   Patient on 58W where she was participating in therapies. Patient is drowsy at baseline though noted to be worsened lethargy while working with therapy. MD to bedside at that time, CT head ordered. Code stroke called after returned from [place]. New pain meds started this AM.   Stroke team at the bedside after patient activation. Patient has already returned from CT. NIHSS 1, see documentation for details and code stroke times. Patient with disoriented on exam.   CBG noted to be 45 upon stroke team arrival, dextrose given.   The following imaging was completed:  CT Head. Patient is not a candidate for IV Thrombolytic due to [place]. Patient is not a candidate for IR due to LVO not suspected.   Care/Plan: SBP <160 q2h NIHSS.   Bedside handoff with RN Doroteo Bradford.    Candace Cruise K  Rapid Response RN

## 2023-02-11 NOTE — Progress Notes (Signed)
Hypoglycemic Event  CBG: 45  Treatment: D50 50 mL (25 gm)  Symptoms:LOC  Follow-up CBG: Time:1409  CBG Result:125  Possible Reasons for Event: Inadequate meal intake  Comments/MD notified:Dr. Tressa Busman notified.    Angela [name]

## 2023-02-11 NOTE — Plan of Care (Signed)
Goals downgraded due to slow progress and participation secondary to fatigue Problem: RH Problem Solving Goal: LTG Patient will demonstrate problem solving for (SLP) Description: LTG:  Patient will demonstrate problem solving for basic/complex daily situations with cues  (SLP) Flowsheets (Taken 02/11/2023 1224) LTG Patient will demonstrate problem solving for: Moderate Assistance - Patient 50 - 74%   Problem: RH Memory Goal: LTG Patient will use memory compensatory aids to (SLP) Description: LTG:  Patient will use memory compensatory aids to recall biographical/new, daily complex information with cues (SLP) Flowsheets (Taken 02/11/2023 1224) LTG: Patient will use memory compensatory aids to (SLP): Moderate Assistance - Patient 50 - 74%   Problem: RH Attention Goal: LTG Patient will demonstrate this level of attention during functional activites (SLP) Description: LTG:  Patient will will demonstrate this level of attention during functional activites (SLP) Flowsheets (Taken 02/11/2023 1224) LTG: Patient will demonstrate this level of attention during cognitive/linguistic activities with assistance of (SLP): Minimal Assistance - Patient > 75%   Problem: RH Awareness Goal: LTG: Patient will demonstrate awareness during functional activites type of (SLP) Description: LTG: Patient will demonstrate awareness during functional activites type of (SLP) Flowsheets (Taken 02/11/2023 1224) LTG: Patient will demonstrate awareness during cognitive/linguistic activities with assistance of (SLP): Moderate Assistance - Patient 50 - 74%

## 2023-02-11 NOTE — Progress Notes (Addendum)
Speech Language Pathology Daily Session Note  Patient Details  Name: Angela [name] MRN: [id number] Date of Birth: 1986-05-31  Today's Date: 02/11/2023 SLP Individual Time: 0900-1000 SLP Individual Time Calculation (min): 60 min  Short Term Goals: Week 1: SLP Short Term Goal 1 (Week 1): Pt will consume therapeutic trials of thin liquids with minimal overt s/s of aspiration and min cues for use of swallowing precautions. SLP Short Term Goal 2 (Week 1): Pt will consume dys 3 textures and nectar thick liquids with minimal overt s/s of aspiration and min cues of use of swallowing precautions. SLP Short Term Goal 3 (Week 1): Pt will sustain her attention to basic tasks for ~5 minute intervals with min cues for redirection. SLP Short Term Goal 4 (Week 1): Pt will complete basic tasks with mod cues for functional problem solving. SLP Short Term Goal 5 (Week 1): Pt will utilize external aids with mod cues to recall daily information. SLP Short Term Goal 6 (Week 1): Pt will utilize compensatory strategies for naming  with mod assist during functional conversational tasks.  Skilled Therapeutic Interventions: Skilled ST treatment focused on cognitive goals. Pt was greeted sleeping in bed on arrival and requiring extensive coaxing and time to rouse in order to participate (~20 minutes). Pt kept her eyes closed for majority of encounter. Pt engaged in simple conversation exchange while discussing orientation to time, place, and situation with max A verbal/visual cues for use of external aids. Also provided education regarding swallowing strategies as per MBS obtained yesterday. Pt was unable to recall orientation information and swallowing precautions following 1 min delay 2/2 severe attention and working memory deficits likely exacerbated by significant lethargy. Wrote down information in pt's memory notebook. Pt would not open her eyes to read notebook. Nurse arrived for med pass. Pt reluctant to sit up to take  meds but eventually did so with mod physical A. Pt consumed whole pills one at a time given in pudding. Pt followed with rinse of thin water without overt s/sx of aspiration. Pt requesting to use bathroom. SLP placed walker in front of pt as per current safety plan. Pt pushed walker out of the way and stated "it's in my way." Pt made it to bathroom safety but appeared off balance and leaned into wall at one point. SLP had to quickly adjust pt to minimize lean. While on commode, pt kept leaning and closing her eyes with concern that she would fall asleep and fall off commode. SLP provided close supervision to ensure she remained upright and awake. Pt voided bowels and bladder. Pt performed peri care and washed her hands with supervision. Transferred into wheelchair for remainder of session. Transported patient down to ortho gym for use of BITS however once we arrived pt complained of 7/10 stabbing pain in her left side + back region. Pt grimacing and restless in wheelchair, almost propelling herself out of chair at one point d/t the pain. With max A cues, pt was able to eventually reposition herself in chair and took pt back to room where she consumed 8oz of water and transferred to bed. SLP informed nurse of significant lethargy today and side + back pain. Nurse requested to place ice pack on pt's back and to encourage fluid intake. Vitals taken- 133/91, HR 85, SpO2 100%, and temp 98.4. Notified PT who is scheduled to work with patient later regarding lethargy, back pain, and try to encourage fluids. Patient was left in bed with alarm activated and immediate needs within reach  at end of session. Continue per current plan of care.      Pain Pain Assessment Pain Scale: 0-10 Pain Score: 8  Pain Type: Acute pain Pain Location: Back Pain Orientation: Left Pain Descriptors / Indicators: Stabbing Pain Onset: Gradual Pain Intervention(s): RN made aware;Repositioned;Hot/Cold interventions Multiple Pain Sites:  No  Therapy/Group: Individual Therapy  Patty Sermons 02/11/2023, 12:22 PM

## 2023-02-11 NOTE — Progress Notes (Signed)
Hypoglycemic Event  CBG: 53  Treatment: 8 oz juice/soda and Glucerna per MD  Symptoms: None  Follow-up CBG: Time: 1642, 1702  CBG Result:78, 80  Possible Reasons for Event: Unknown  Comments/MD notified: MD Tressa Busman) aware    Angela [name]
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
## 2023-02-11 NOTE — Progress Notes (Signed)
Physical Therapy Session Note  Patient Details  Name: Angela [name] MRN: [id number] Date of Birth: 25-Aug-1986  Today's Date: 02/11/2023 PT Individual Time: [id number] PT Individual Time Calculation (min): 38 min   Short Term Goals: Week 1:  PT Short Term Goal 1 (Week 1): STG=LTG 2/2 ELOS  Skilled Therapeutic Interventions/Progress Updates:   Pt received supine in bed, MD present. Pt noted to be more lethargic than prior sessions requiring max assist to EOB and min assist to maintain balance once sitting EOB. Pt transported in to [facility] to atrium of hospital to improve arousal with increased stimuli. Pt noted to cough, then report need to vomit through gestures and nead nod. Emesis episode into trash can in artium RN notified and then present to assist pt back to room. Following emesis episode, pt reports " I feel I could pass out" all other communication was mumbled and hard to understand, but able to communicatr HA and back pain. Mod assist for transfer back to bed with RN present VS assessed in supine: 149/88 HR 81 bpm, SpO2 98%, Temp 98.4 orally. MD notified and made aware of presentation and Charge nurse present to performed additional assessment. Pt left supine in bed with medical team present.      Therapy Documentation Precautions:  Precautions Precautions: Fall, Other (comment) Precaution Comments: left side weakness, HD on MWF schedule, Restrictions Weight Bearing Restrictions: No    Vital Signs: Therapy Vitals Temp: 98.4 F (36.9 C) Temp Source: Oral Pulse Rate: 81 Resp: 17 BP: (!) 149/88 Patient Position (if appropriate): Orthostatic Vitals Oxygen Therapy SpO2: 100 % O2 Device: Room Air Pain: Pain Assessment Pain Scale: 0-10 Pain Score: 8  HA  and low back facial grimace.    Therapy/Group: Individual Therapy  Lorie Phenix 02/11/2023, 12:11 PM

## 2023-02-12 DIAGNOSIS — I611 Nontraumatic intracerebral hemorrhage in hemisphere, cortical: Secondary | ICD-10-CM

## 2023-02-12 LAB — GLUCOSE, CAPILLARY
Glucose-Capillary: 171 mg/dL — ABNORMAL HIGH (ref 70–99)
Glucose-Capillary: 324 mg/dL — ABNORMAL HIGH (ref 70–99)
Glucose-Capillary: 339 mg/dL — ABNORMAL HIGH (ref 70–99)
Glucose-Capillary: 405 mg/dL — ABNORMAL HIGH (ref 70–99)
Glucose-Capillary: 426 mg/dL — ABNORMAL HIGH (ref 70–99)
Glucose-Capillary: 489 mg/dL — ABNORMAL HIGH (ref 70–99)
Glucose-Capillary: 61 mg/dL — ABNORMAL LOW (ref 70–99)
Glucose-Capillary: 99 mg/dL (ref 70–99)

## 2023-02-12 MED ORDER — INSULIN GLARGINE-YFGN 100 UNIT/ML ~~LOC~~ SOLN
10.0000 [IU] | Freq: Every day | SUBCUTANEOUS | Status: DC
  Administered 2023-02-13: 10 [IU] via SUBCUTANEOUS
  Filled 2023-02-12: qty 0.1

## 2023-02-12 NOTE — Progress Notes (Signed)
[facility] Progress Note   Subjective:    Seen in room. Yesterday's events noted --Code stroke d/t change in alertness. Neuro consulted.    Objective Vitals:   02/11/23 1559 02/11/23 2015 02/12/23 0404 02/12/23 0417  BP: (!) 148/67 (!) 149/77 125/65   Pulse: 79 85 84   Resp: '20 16 18   '$ Temp: 98.7 F (37.1 C) 98.6 F (37 C) 98.9 F (37.2 C)   TempSrc:  Oral    SpO2: 100% 99% 98%   Weight:    67.4 kg  Height:       Physical Exam General: Lying in bed, facial edema,  Heart: RRR; no murmur Lungs: Clear bilaterally  Abdomen: soft Extremities: no LE edema Dialysis Access: LUE AVF + bruit  Filed Weights   02/10/23 1942 02/11/23 0500 02/12/23 0417  Weight: 60.7 kg 67.6 kg 67.4 kg    Intake/Output Summary (Last 24 hours) at 02/12/2023 1300 Last data filed at 02/12/2023 0710 Gross per 24 hour  Intake 597 ml  Output --  Net 597 ml     Additional Objective Labs: Basic Metabolic Panel: Recent Labs  Lab 02/06/23 0610 02/09/23 0627 02/11/23 1207  NA 132* 134* 130*  K 4.7 3.7 3.7  CL 91* 96* 92*  CO2 '24 28 29  '$ GLUCOSE 111* 180* 76  BUN 40* 13 15  CREATININE 8.04* 4.82* 5.77*  CALCIUM 10.1 9.7 9.8  PHOS 6.3* 4.2  --     Liver Function Tests: Recent Labs  Lab 02/06/23 0610 02/09/23 0627 02/11/23 1207  AST  --   --  26  ALT  --   --  14  ALKPHOS  --   --  102  BILITOT  --   --  0.6  PROT  --   --  6.0*  ALBUMIN 3.0* 2.9* 3.0*    No results for input(s): "LIPASE", "AMYLASE" in the last 168 hours. CBC: Recent Labs  Lab 02/06/23 0610 02/08/23 1730 02/09/23 0627 02/11/23 1207  WBC 12.4* 10.7* 10.3 11.1*  NEUTROABS  --   --  6.1 7.7  HGB 8.9* 8.7* 8.9* 8.7*  HCT 26.9* 26.9* 27.2* 26.5*  MCV 89.7 92.4 91.6 92.0  PLT 219 180 162 173    Blood Culture    Component Value Date/Time   SDES BLOOD BLOOD LEFT HAND 01/27/2023 1836   SPECREQUEST  01/27/2023 1836    BOTTLES DRAWN AEROBIC AND ANAEROBIC Blood Culture adequate volume   CULT   01/27/2023 1836    NO GROWTH 5 DAYS Performed at [facility] [address], [place], [place] [address]    REPTSTATUS 02/01/2023 FINAL 01/27/2023 1836    Cardiac Enzymes: No results for input(s): "CKTOTAL", "CKMB", "CKMBINDEX", "TROPONINI" in the last 168 hours. CBG: Recent Labs  Lab 02/12/23 0023 02/12/23 0421 02/12/23 0551 02/12/23 0759 02/12/23 1118  GLUCAP 324* 489* 426* 339* 405*    Iron Studies: No results for input(s): "IRON", "TIBC", "TRANSFERRIN", "FERRITIN" in the last 72 hours. Lab Results  Component Value Date   INR 0.96 09/21/2017   INR 1.07 08/25/2017     Medications:   amLODipine  10 mg Oral QHS   busPIRone  15 mg Oral QHS   carvedilol  6.25 mg Oral BID WC   Chlorhexidine Gluconate Cloth  6 each Topical Daily   darbepoetin (ARANESP) injection - DIALYSIS  100 mcg Subcutaneous Q Fri-1800   diltiazem  60 mg Oral Q6H   escitalopram  20 mg Oral Daily  gabapentin  100 mg Oral TID   heparin injection (subcutaneous)  5,000 Units Subcutaneous Q8H   hydrALAZINE  100 mg Oral Q8H   insulin aspart  0-6 Units Subcutaneous TID WC   insulin aspart  1 Units Subcutaneous TID WC   insulin glargine-yfgn  8 Units Subcutaneous Daily   lanthanum  1,000 mg Oral TID with meals   levETIRAcetam  500 mg Oral BID   levothyroxine  175 mcg Oral q morning   lidocaine  1 patch Transdermal Q24H   multivitamin  1 tablet Oral QHS   pantoprazole  40 mg Oral Daily   senna-docusate  1 tablet Oral BID    Dialysis Orders: MWF [facility] [address] 3.5hr, EDW 62kg, 2K/2.5Ca bath, LUE AVF, Prev was getting Hep 2600 + 500 u/hr - now held  Assessment/Plan: 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 Code stroke on 2/24 d/t increased lethargy. Hypoglycemia corrected. Neuro consulted -Brain MRI w/o acute changes.  ESRD: Continue HD on MWF schedule - next HD 2/26, no added heparin. Anemia of ESRD: Hgb 8.9,  Aranesp started q Friday. 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 Phos improving, continue lanthanum as binder Acute hypoxic respiratory failure: Extubated. Resolved.  Hypertension: BP up. On diltiazem, amlodipine, hydralazine. UF as able.  Dispo: dc date plan for 2/27. Apparently patient's Mother is requesting her to be transferred to another HD clinic in [place]; however, already discussed she will need to imitate that request at patient primary outpatient HD center.   Lynnda Child PA-C [facility] 02/12/2023,1:00 PM

## 2023-02-12 NOTE — Progress Notes (Signed)
Hypoglycemic Event  CBG: 61  Treatment: 4 oz juice/soda  Symptoms: None  Follow-up CBG: Time:0015 CBG Result:  Possible Reasons for Event: Unknown  Comments/MD notified: pt showing no symptoms. Will recheck in 35mns     Angela [name]

## 2023-02-12 NOTE — Progress Notes (Signed)
PROGRESS NOTE   Subjective/Complaints:  Patient cognition much improved this AM; remains lethargic but easily arrousable to verbal and responsive to all questions. No complaints today except ongoing back pain, unchanged, nontender to palpation.   Did review MRI results overnight and notified mom that ischemic area ?PICA infarct on CT was not appreciated on MRI.   ROS: Unable to obtain due to lethargy, cognitive status  Objective:   MR BRAIN WO CONTRAST  Result Date: 02/11/2023 CLINICAL DATA:  Headache with acute neurologic deficit. EXAM: MRI HEAD WITHOUT CONTRAST TECHNIQUE: Multiplanar, multiecho pulse sequences of the brain and surrounding structures were obtained without intravenous contrast. COMPARISON:  Brain MRI 04/05/2022 Head CT 02/03/2023, 02/11/2023 FINDINGS: Brain: Unchanged appearance of intraparenchymal hematoma centered at the left caudate head with adjacent lateral ventricular extension. There is blood along the right frontal approach shunt tract. Mild surrounding edema. EVD has been removed. Normal white matter signal, parenchymal volume and CSF spaces. The midline structures are normal. Vascular: Normal flow voids. Skull and upper cervical spine: Normal marrow signal. Sinuses/Orbits: Right maxillary sinus retention cyst. Normal orbits. Other: None. IMPRESSION: Unchanged appearance of intraparenchymal hematoma centered at the left caudate head with adjacent lateral ventricular extension. Electronically Signed   By: [name] M.D.   On: 02/11/2023 19:12   CT HEAD WO CONTRAST (5MM)  Result Date: 02/11/2023 CLINICAL DATA:  37 year old female who presented with hemorrhage into the left caudate and ventricular system on 01/27/2023 treated with EVD. EXAM: CT HEAD WITHOUT CONTRAST TECHNIQUE: Contiguous axial images were obtained from the base of the skull through the vertex without intravenous contrast. RADIATION DOSE REDUCTION:  This exam was performed according to the departmental dose-optimization program which includes automated exposure control, adjustment of the mA and/or kV according to patient size and/or use of iterative reconstruction technique. COMPARISON:  Head CTs 02/03/2023 and earlier. FINDINGS: Brain: Interval removal of the external ventricular drain. Trace blood products in the tract through the frontal lobe. No ventriculomegaly, stable to further improve ventricle size. Fading hemorrhage at the left caudate, also decreased since 02/03/2023. Mild residual regional mass effect. No significant intracranial mass effect. Subtle hypodensity in the left cerebellum PICA territory is new since 01/30/2023 and persists from the prior exam. No associated mass effect. Basilar cisterns remain patent. Elsewhere stable gray-white differentiation elsewhere. Vascular: Calcified atherosclerosis at the skull base. No suspicious intracranial vascular hyperdensity. Skull: Right frontal burr hole. No acute osseous abnormality identified. Sinuses/Orbits: Continued sinus fluid or mucosal thickening but mildly improved aeration. Tympanic cavities and mastoids remain clear. Other: Left nasoenteric tube is probably been removed. Postoperative changes to the superior right scalp. Negative orbits. IMPRESSION: 1. Appearance suspicious for a subtle, subacute Left PICA cerebellar infarct. No associated hemorrhage or mass effect. MRI might be valuable to confirm. 2. Interval removal of the external ventricular drain with no adverse features. Slowly resolving left caudate hemorrhage. These results will be called to the ordering clinician or representative by the Radiologist Assistant, and communication documented in the PACS or [employer]. Electronically Signed   By: [name] M.D.   On: 02/11/2023 13:23   Recent Labs    02/11/23 1207  WBC 11.1*  HGB 8.7*  HCT 26.5*  PLT 173    Recent Labs    02/11/23 1207  NA 130*  K 3.7  CL 92*  CO2 29   GLUCOSE 76  BUN 15  CREATININE 5.77*  CALCIUM 9.8     Intake/Output Summary (Last 24 hours) at 02/12/2023 1535 Last data filed at 02/12/2023 1305 Gross per 24 hour  Intake 715 ml  Output --  Net 715 ml         Physical Exam: Vital Signs Blood pressure 136/61, pulse 81, temperature 99.1 F (37.3 C), resp. rate 16, height 5' 4.96" (1.65 m), weight 67.4 kg, SpO2 100 %.  Drowsy, does not open eyes, minimally responsive to physical stimuli, nonresponsive to verbal stimuli.  With persistent cueing, does swing legs over bed and perform stand pivot transfer with PT.  Keeps eyes closed throughout.  General: No acute distress Mood and affect are appropriate Heart: Regular rate and rhythm no rubs murmurs or extra sounds. + Right JVD Lungs: Clear to auscultation, breathing unlabored, no rales or wheezes Abdomen: Positive bowel sounds, tender to palpation along left lower rib cage - unchanged, nondistended Extremities: No clubbing, cyanosis, or edema MSK:  RIght knee without abrasion or ecchymosis , no pain to palp or with ROM   Skin:LUE AVF Neuro: Oriented x4 with use of room cues.  All 4 extremities antigravity and against resistance, reacts to stimuli in all 4 extremities.  Does not participate in cranial nerve testing.  Assessment/Plan: 1. Functional deficits which require 3+ hours per day of interdisciplinary therapy in a comprehensive inpatient rehab setting. Physiatrist is providing close team supervision and 24 hour management of active medical problems listed below. Physiatrist and rehab team continue to assess barriers to discharge/monitor patient progress toward functional and medical goals  Care Tool:  Bathing    Body parts bathed by patient: Right arm, Left arm, Chest, Abdomen, Front perineal area, Buttocks, Right upper leg, Left upper leg, Right lower leg, Left lower leg, Face         Bathing assist Assist Level: Set up assist     Upper Body  Dressing/Undressing Upper body dressing   What is the patient wearing?: Pull over shirt    Upper body assist Assist Level: Set up assist    Lower Body Dressing/Undressing Lower body dressing      What is the patient wearing?: Pants     Lower body assist Assist for lower body dressing: Set up assist     Toileting Toileting Toileting Activity did not occur (Clothing management and hygiene only): N/A (no void or bm)  Toileting assist Assist for toileting: Contact Guard/Touching assist     Transfers Chair/bed transfer  Transfers assist     Chair/bed transfer assist level: Contact Guard/Touching assist     Locomotion Ambulation   Ambulation assist      Assist level: Minimal Assistance - Patient > 75% Assistive device: Other (comment) (at gait belt and L shoulder) Max distance: 161   Walk 10 feet activity   Assist     Assist level: Minimal Assistance - Patient > 75% Assistive device: Other (comment) (at gait belt and L shoulder)   Walk 50 feet activity   Assist    Assist level: Minimal Assistance - Patient > 75% Assistive device: Other (comment) (at gait belt and L shoulder)    Walk 150 feet activity   Assist    Assist level: Minimal Assistance - Patient > 75% Assistive device: Other (comment) (at gait belt and L  shoulder)    Walk 10 feet on uneven surface  activity   Assist     Assist level: Minimal Assistance - Patient > 75% (ramp) Assistive device: Other (comment) (at gait belt and L shoulder and does reach for external support if available.)   Wheelchair     Assist Is the patient using a wheelchair?: Yes Type of Wheelchair: Manual    Wheelchair assist level: Supervision/Verbal cueing Max wheelchair distance: 100    Wheelchair 50 feet with 2 turns activity    Assist    Wheelchair 50 feet with 2 turns activity did not occur:  (per PT documentation)   Assist Level: Supervision/Verbal cueing   Wheelchair 150 feet  activity     Assist      Assist Level: Moderate Assistance - Patient 50 - 74% (per PT documentation of 100 feet max distance)   Blood pressure 136/61, pulse 81, temperature 99.1 F (37.3 C), resp. rate 16, height 5' 4.96" (1.65 m), weight 67.4 kg, SpO2 100 %.  Medical Problem List and Plan: 1. Functional deficits secondary to [place] left caudate with resolving IVH - uncontrolled              -patient may shower             -ELOS/Goals: 02/14/23 S            -Defer to primary team patient appropriateness for discharge Tuesday, family extremely concerned regarding her ongoing lethargy   2.  Antithrombotics: -DVT/anticoagulation:  Pharmaceutical: Heparin             -antiplatelet therapy: none   3. Pain Management: Tylenol as needed -2-24: Patient reported persistent headache to mom, overnight staff.  Trial Fioricet 2 tabs 3 times daily as needed, received 1 dose, switched back to Tylenol due to concern for worsening lethargy from barbital portion of Fioricet - 2/25: Denies HA today   4. Mood/Behavior/Sleep: LCSW to evaluate and provide emotional support             -depression/anxiety: continue Buspar and Lexapro             -antipsychotic agents: n/a   5. Neuropsych/cognition: This patient not capable of making decisions on her own behalf.   -May benefit from Ritalin or modafinil to increase arousal, however as she does seem to arouse enough to participate in therapies and is close to discharge, will defer this to primary team  - 2/25: cognition much improved; monitor  6. Skin/Wound Care: Routine skin care checks   7. Fluids/Electrolytes/Nutrition: strict Is and Os; chemistries as per nephrology             -TF d/c ed             -continue Fosrenol, MVI, Aranesp   8: Hypertension: monitor TID and prn (home Lasix, losartan held>>other home meds restarted             -continue amlodipine 10 mg daily             -diltiazem 60 mg TID             -hydralazine 100 mg TID      Vitals:   02/12/23 0404 02/12/23 1319  BP: 125/65 136/61  Pulse: 84 81  Resp: 18 16  Temp: 98.9 F (37.2 C) 99.1 F (37.3 C)  SpO2: 98% 100%  Labile pulse mildly elevated and BP still up will start low dose coreg, may titrate this up and reduce diltiazem - 2/24:  Pulse consistently in 80s to 90s, increase Coreg for blood pressure control with improvements this afternoon - BP much imporved  9: Hyperlipidemia: home statin held   10: Hydrocephalus: resolved; EVD placed 2/09, removed 2/16 -CT head, MRI stable 2/24   11: ESRD: HD on M/W/F via LUE AVF   12: DM-1; uncontolled: continue CBGs q 4 hours. Sensitive sliding scale changed to very sensitive given CBG of 32 on admission -decrease Semglee to 12 units daily -> 8U daily due to persistent hypoglycemia and recurrent hypoglycemic episodes -> 10U d/t hyperglycemia 2/25 -reduce novalog to  1U TID novolog with meals as per diabetic coordinator recommendations. Hold if po intake <50%   CBG (last 3)  Recent Labs    02/12/23 0551 02/12/23 0759 02/12/23 1118  GLUCAP 426* 339* 405*   Uncontrolled , meal intake ~50%, 0809 CBG post prandial, has received ~4U novalog per day on SSI    13: Seizure disorder: home dose Keppra resumed             continue Keppra 500 mg BID             -gabapentin 100 mg TID-unable to increase due to creatinine clearance  may be contributing to drowsiness  -will hold off on medication changes that may reduce seizure threshold at this time  14: Hypothyroidism: continue Synthroid   15: Anemia, chronic disease: s/p 1 unit PCs, follow-up CBC    LOS: 8 days A FACE TO FACE EVALUATION WAS PERFORMED  Gertie Gowda 02/12/2023, 3:35 PM

## 2023-02-12 NOTE — Progress Notes (Signed)
Patient is sitting up 90 degree angle for meal and c/o of chest pain that is aching on the right side. V/S are stable, patient is alert to person and place and time. MD and charge nurse made aware, EKG and v/s performed. Bed in lowest position, call bell within reach, other safety precautions on.

## 2023-02-12 NOTE — Progress Notes (Signed)
FBS: 405; gave 6 units of insulin and MD made aware.

## 2023-02-12 NOTE — Progress Notes (Signed)
Occupational Therapy Session Note  Patient Details  Name: Angela [name] MRN: [id number] Date of Birth: 1986/03/09  Today's Date: 02/12/2023 OT Individual Time: 1118-1200 OT Individual Time Calculation (min): 42 min    Short Term Goals: Week 1:  OT Short Term Goal 1 (Week 1): STGs=LTGs due to patient's length of stay.  Skilled Therapeutic Interventions/Progress Updates:    Patient agreeable to participate in OT session. Reports pain in left side mid back proximal to ribs. Reports pain started ~ 2 days ago. No known cause. Dr. Tressa Busman in room and informed OT that pt's blood sugar reading was taken and it was high. Nurse informed to give insulin.  Bed level therapy session completed due to high sugar level.  Patient participated in skilled OT session focusing on pain management to back region and NM re-ed to right knee for instability. Therapist applied kinsiotape to right knee in order to improve knee stability to increase functional performance during every day transfers and sit<>stand.  Myofacial release and trigger point release completed to left side lats and lower trapezius to decrease fascial restrictions and pain level. Large trigger point palpated along medial border of left scapula. Will continue to benefit from manual therapy to decrease back pain.     Therapy Documentation Precautions:  Precautions Precautions: Fall, Other (comment) Precaution Comments: left side weakness, HD on MWF schedule, Restrictions Weight Bearing Restrictions: No  Therapy/Group: Individual Therapy  Ailene Ravel, OTR/L,CBIS  Supplemental OT - [facility] and [facility] Secure Chat Preferred   02/12/2023, 7:52 AM

## 2023-02-13 LAB — GLUCOSE, CAPILLARY
Glucose-Capillary: 130 mg/dL — ABNORMAL HIGH (ref 70–99)
Glucose-Capillary: 142 mg/dL — ABNORMAL HIGH (ref 70–99)
Glucose-Capillary: 144 mg/dL — ABNORMAL HIGH (ref 70–99)
Glucose-Capillary: 205 mg/dL — ABNORMAL HIGH (ref 70–99)
Glucose-Capillary: 254 mg/dL — ABNORMAL HIGH (ref 70–99)
Glucose-Capillary: 296 mg/dL — ABNORMAL HIGH (ref 70–99)
Glucose-Capillary: 379 mg/dL — ABNORMAL HIGH (ref 70–99)
Glucose-Capillary: 80 mg/dL (ref 70–99)

## 2023-02-13 MED ORDER — INSULIN GLARGINE-YFGN 100 UNIT/ML ~~LOC~~ SOLN
12.0000 [IU] | Freq: Every day | SUBCUTANEOUS | Status: DC
  Administered 2023-02-14 – 2023-02-15 (×2): 12 [IU] via SUBCUTANEOUS
  Filled 2023-02-13 (×3): qty 0.12

## 2023-02-13 NOTE — Progress Notes (Addendum)
Patient ID: Angela [name], female   DOB: 1986/04/01, 37 y.o.   MRN: [id number]  Sw was informed on Friday 2/23 HD team discussed with pt mother she will need to initiate that request at patient primary outpatient HD center   2/26 10:04 AM: SW received call from mother this AM to FU on transition request. Sw received FU from HD coordinator,  typically pt's home clinic would start the referral to initiate a facility switch. HD coordinator will reach out to facility to potentially initiate and will FU with Sw once she knows more.  Sw made attempt to call pt's mother to provide information. Left detailed VM.   2/26 11:05 AM: Sw informed HD SW, Chee that [name] was able to make a referral to [facility]. Clinic will reach out to [place] will start the process for transportation due to mother unable to transport to HD.   Vick Frees, [phone] [facility] application sent to [facility].   Patient is currently MWF [facility] with potential to change MWF [facility] . Sw will wait for update.

## 2023-02-13 NOTE — Progress Notes (Signed)
Current CBG is 144. No further concerns at this time. Call bell in reach

## 2023-02-13 NOTE — Progress Notes (Addendum)
Sw spoke with team. Patient has met all therapy goals and medically stable for d/c. Patient will d/c home tomorrow if HD location is set, if not potentially on Wednesday. SW informed mother of the potential of tomorrow of Wednesday. Mother agreeable and will be present after 10 AM tomorrow due to an appointment. No additional questions or concerns.   4:10 PM: Sw spoke with mother to discuss arranging temporary transportation to HD until transportation services are able to being. Patient has been pre-certified with [facility]. SW and HD coordinator waiting on determination of facility change. Mother fine with arranging transportation with pt's sister until patient is able to start services. No additional questions or concerns.

## 2023-02-13 NOTE — Plan of Care (Deleted)
  Problem: RH Swallowing Goal: LTG Patient will consume least restrictive diet using compensatory strategies with assistance (SLP) Description: LTG:  Patient will consume least restrictive diet using compensatory strategies with assistance (SLP) Outcome: Not Met (add Reason) Note: Min A verbal cues needed during meals Goal: LTG Patient will participate in dysphagia therapy to increase swallow function with assistance (SLP) Description: LTG:  Patient will participate in dysphagia therapy to increase swallow function with assistance (SLP) Outcome: Not Met (add Reason) Note: Min A verbal cues   Problem: RH Problem Solving Goal: LTG Patient will demonstrate problem solving for (SLP) Description: LTG:  Patient will demonstrate problem solving for basic/complex daily situations with cues  (SLP) Outcome: Not Met (add Reason)   Problem: RH Memory Goal: LTG Patient will use memory compensatory aids to (SLP) Description: LTG:  Patient will use memory compensatory aids to recall biographical/new, daily complex information with cues (SLP) Outcome: Not Met (add Reason)   Problem: RH Awareness Goal: LTG: Patient will demonstrate awareness during functional activites type of (SLP) Description: LTG: Patient will demonstrate awareness during functional activites type of (SLP) Outcome: Not Met (add Reason)

## 2023-02-13 NOTE — Progress Notes (Addendum)
Patient ID: Angela [name], female   DOB: 24-Aug-1986, 37 y.o.   MRN: [id number]  Sw spoke with pt's mother. Mother expressed she has spoken to physician but still has additional questions. Mother plans to attend nursing education tomorrow after oncology appointment and will complete family education on Wednesday 9-12. With a potential d/c Wednesday after education (if allotted) or d/c Thursday.

## 2023-02-13 NOTE — Progress Notes (Signed)
Occupational Therapy Discharge Summary  Patient Details  Name: Angela [name] MRN: [id number] Date of Birth: [birth year]  Date of Discharge from OT service:February 13, 2023   Patient has met 9 of 9 long term goals due to improved activity tolerance, improved balance, postural control, ability to compensate for deficits, and improved coordination.  Patient to discharge at overall Supervision level.  Patient's care partner is independent to provide the necessary physical and cognitive assistance at discharge.    Reasons goals not met: n/a  Recommendation:  Patient will benefit from ongoing skilled OT services in home health setting to continue to advance functional skills in the area of BADL and iADL.  Equipment: Transfer tub bench   Reasons for discharge: treatment goals met  Patient/family agrees with progress made and goals achieved: Yes  OT Discharge Precautions/Restrictions  Precautions Precautions: Fall Restrictions Weight Bearing Restrictions: No ADL ADL Eating: Independent Where Assessed-Eating: Bed level Grooming: Independent Upper Body Bathing: Setup Where Assessed-Upper Body Bathing: Shower Lower Body Bathing: Setup Where Assessed-Lower Body Bathing: Shower Upper Body Dressing: Setup Where Assessed-Upper Body Dressing: Chair Lower Body Dressing: Setup Where Assessed-Lower Body Dressing: Chair Toileting: Setup Where Assessed-Toileting: Bedside Commode Toilet Transfer: Close supervision Toilet Transfer Method: Counselling psychologist: Other (comment) (toilet) Tub/Shower Transfer: Close supervison Tub/Shower Transfer Method: Optometrist: Facilities manager: Close supervision Social research officer, government Method: Heritage manager: Radio broadcast assistant ADL Comments: Pt requiring encouragement for independence. Vision Baseline Vision/History: 0 No visual deficits Patient Visual Report: No  change from baseline Vision Assessment?: No apparent visual deficits Perception  Perception: Within Functional Limits Praxis Praxis: Intact Cognition Cognition Overall Cognitive Status: Impaired/Different from baseline Arousal/Alertness: Lethargic Orientation Level: Person;Place;Situation Person: Oriented Place: Oriented Situation: Oriented Memory: Impaired Memory Impairment: Storage deficit;Retrieval deficit;Decreased recall of new information;Decreased short term memory Awareness: Impaired Awareness Impairment: Emergent impairment Brief Interview for Mental Status (BIMS) Repetition of Three Words (First Attempt): 3 Temporal Orientation: Year: Correct Temporal Orientation: Month: Accurate within 5 days Temporal Orientation: Day: Incorrect Recall: "Sock": Yes, no cue required Recall: "Blue": Yes, no cue required Recall: "Bed": Yes, after cueing ("a piece of furniture") BIMS Summary Score: 13 Sensation Sensation Light Touch: Appears Intact Hot/Cold: Appears Intact Proprioception: Appears Intact Stereognosis: Appears Intact Coordination Gross Motor Movements are Fluid and Coordinated: Yes Fine Motor Movements are Fluid and Coordinated: Yes Coordination and Movement Description: Generalized weakness. Finger Nose Finger Test: [facility] Motor  Motor Motor: Hemiplegia Motor - Skilled Clinical Observations: R sided weakness in LE only, UEs WFL Mobility  Bed Mobility Rolling Left: Independent Supine to Sit: Independent Sit to Supine: Independent Transfers Sit to Stand: Supervision/Verbal cueing Stand to Sit: Supervision/Verbal cueing  Trunk/Postural Assessment    Slight R lean with ambulation Balance Dynamic Sitting Balance Dynamic Sitting - Level of Assistance: 7: Independent Static Standing Balance Static Standing - Level of Assistance: 5: Stand by assistance Dynamic Standing Balance Dynamic Standing - Level of Assistance: 5: Stand by assistance Extremity/Trunk  Assessment RUE Assessment RUE Assessment: Within Functional Limits LUE Assessment LUE Assessment: Within Functional Limits   Angela [name] 02/13/2023, 9:59 AM

## 2023-02-13 NOTE — Progress Notes (Signed)
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 Code stroke on 2/24 d/t increased lethargy. Hypoglycemia ongoing--per primary service. Neuro consulted -Brain MRI w/o acute changes.  ESRD: Continue HD on MWF schedule - next HD today, no added heparin. 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 Phos improving, continue lanthanum as binder Acute hypoxic respiratory failure: Extubated. Resolved.  Hypertension: BP up. On diltiazem, amlodipine, hydralazine. UF as able.  Dispo: dc date plan for 2/27. Apparently patient's Mother is requesting her to be transferred to another HD clinic in [place]; however, already discussed she will need to initiate that request at patient primary outpatient HD center.   Outpatient Dialysis Orders: MWF [facility] [address] 3.5hr, EDW 62kg, 2K/2.5Ca bath, LUE AVF, Prev was getting Hep 2600 + 500 u/hr - now held  Subjective:   Had hypoglycemic episode. Drowsy but arousable to vocal and tactile stimuli   Objective:   BP (!) 141/80 (BP Location: Right Arm)   Pulse 80   Temp 98.3 F (36.8 C)   Resp 17   Ht 5' 4.96" (1.65 m)   Wt 69.3 kg   SpO2 99%   BMI 25.45 kg/m   Intake/Output Summary (Last 24 hours) at 02/13/2023 1006 Last data filed at 02/13/2023 0700 Gross per 24 hour  Intake 354 ml  Output --  Net 354 ml   Weight change:   Physical Exam: Gen: NAD/drowsy CVS: RRR Resp: CTA B/L Abd: soft Ext: no edema Neuro: drowsy but does awaken to vocal and tactile stimuli Dialysis access: LUE AVF +b/t  Imaging: MR BRAIN WO CONTRAST  Result Date: 02/11/2023 CLINICAL DATA:  Headache with acute neurologic deficit. EXAM: MRI HEAD WITHOUT CONTRAST TECHNIQUE: Multiplanar, multiecho pulse sequences of the brain and surrounding structures were obtained without intravenous contrast.  COMPARISON:  Brain MRI 04/05/2022 Head CT 02/03/2023, 02/11/2023 FINDINGS: Brain: Unchanged appearance of intraparenchymal hematoma centered at the left caudate head with adjacent lateral ventricular extension. There is blood along the right frontal approach shunt tract. Mild surrounding edema. EVD has been removed. Normal white matter signal, parenchymal volume and CSF spaces. The midline structures are normal. Vascular: Normal flow voids. Skull and upper cervical spine: Normal marrow signal. Sinuses/Orbits: Right maxillary sinus retention cyst. Normal orbits. Other: None. IMPRESSION: Unchanged appearance of intraparenchymal hematoma centered at the left caudate head with adjacent lateral ventricular extension. Electronically Signed   By: [name] M.D.   On: 02/11/2023 19:12   CT HEAD WO CONTRAST (5MM)  Result Date: 02/11/2023 CLINICAL DATA:  37 year old female who presented with hemorrhage into the left caudate and ventricular system on 01/27/2023 treated with EVD. EXAM: CT HEAD WITHOUT CONTRAST TECHNIQUE: Contiguous axial images were obtained from the base of the skull through the vertex without intravenous contrast. RADIATION DOSE REDUCTION: This exam was performed according to the departmental dose-optimization program which includes automated exposure control, adjustment of the mA and/or kV according to patient size and/or use of iterative reconstruction technique. COMPARISON:  Head CTs 02/03/2023 and earlier. FINDINGS: Brain: Interval removal of the external ventricular drain. Trace blood products in the tract through the frontal lobe. No ventriculomegaly, stable to further improve ventricle size. Fading hemorrhage at the left caudate, also decreased since 02/03/2023. Mild residual regional mass effect. No significant intracranial mass effect. Subtle hypodensity in the left cerebellum PICA territory is new since 01/30/2023 and persists from the  prior exam. No associated mass effect. Basilar cisterns  remain patent. Elsewhere stable gray-white differentiation elsewhere. Vascular: Calcified atherosclerosis at the skull base. No suspicious intracranial vascular hyperdensity. Skull: Right frontal burr hole. No acute osseous abnormality identified. Sinuses/Orbits: Continued sinus fluid or mucosal thickening but mildly improved aeration. Tympanic cavities and mastoids remain clear. Other: Left nasoenteric tube is probably been removed. Postoperative changes to the superior right scalp. Negative orbits. IMPRESSION: 1. Appearance suspicious for a subtle, subacute Left PICA cerebellar infarct. No associated hemorrhage or mass effect. MRI might be valuable to confirm. 2. Interval removal of the external ventricular drain with no adverse features. Slowly resolving left caudate hemorrhage. These results will be called to the ordering clinician or representative by the Radiologist Assistant, and communication documented in the PACS or [employer]. Electronically Signed   By: [name] M.D.   On: 02/11/2023 13:23    Labs: BMET Recent Labs  Lab 02/09/23 0627 02/11/23 1207  NA 134* 130*  K 3.7 3.7  CL 96* 92*  CO2 28 29  GLUCOSE 180* 76  BUN 13 15  CREATININE 4.82* 5.77*  CALCIUM 9.7 9.8  PHOS 4.2  --    CBC Recent Labs  Lab 02/08/23 1730 02/09/23 0627 02/11/23 1207  WBC 10.7* 10.3 11.1*  NEUTROABS  --  6.1 7.7  HGB 8.7* 8.9* 8.7*  HCT 26.9* 27.2* 26.5*  MCV 92.4 91.6 92.0  PLT 180 162 173    Medications:     amLODipine  10 mg Oral QHS   busPIRone  15 mg Oral QHS   carvedilol  6.25 mg Oral BID WC   Chlorhexidine Gluconate Cloth  6 each Topical Daily   darbepoetin (ARANESP) injection - DIALYSIS  100 mcg Subcutaneous Q Fri-1800   diltiazem  60 mg Oral Q6H   escitalopram  20 mg Oral Daily   gabapentin  100 mg Oral TID   heparin injection (subcutaneous)  5,000 Units Subcutaneous Q8H   hydrALAZINE  100 mg Oral Q8H   insulin aspart  0-6 Units Subcutaneous TID WC   insulin aspart  1  Units Subcutaneous TID WC   [START ON 02/14/2023] insulin glargine-yfgn  12 Units Subcutaneous Daily   lanthanum  1,000 mg Oral TID with meals   levETIRAcetam  500 mg Oral BID   levothyroxine  175 mcg Oral q morning   lidocaine  1 patch Transdermal Q24H   multivitamin  1 tablet Oral QHS   pantoprazole  40 mg Oral Daily   senna-docusate  1 tablet Oral BID      Gean Quint, MD [facility] 02/13/2023, 10:06 AM

## 2023-02-13 NOTE — Plan of Care (Signed)
  Problem: RH Balance Goal: LTG Patient will maintain dynamic standing with ADLs (OT) Description: LTG:  Patient will maintain dynamic standing balance with assist during activities of daily living (OT)  Outcome: Completed/Met   Problem: Sit to Stand Goal: LTG:  Patient will perform sit to stand in prep for activites of daily living with assistance level (OT) Description: LTG:  Patient will perform sit to stand in prep for activites of daily living with assistance level (OT) Outcome: Completed/Met   Problem: RH Grooming Goal: LTG Patient will perform grooming w/assist,cues/equip (OT) Description: LTG: Patient will perform grooming with assist, with/without cues using equipment (OT) Outcome: Completed/Met   Problem: RH Bathing Goal: LTG Patient will bathe all body parts with assist levels (OT) Description: LTG: Patient will bathe all body parts with assist levels (OT) Outcome: Completed/Met   Problem: RH Dressing Goal: LTG Patient will perform lower body dressing w/assist (OT) Description: LTG: Patient will perform lower body dressing with assist, with/without cues in positioning using equipment (OT) Outcome: Completed/Met   Problem: RH Toileting Goal: LTG Patient will perform toileting task (3/3 steps) with assistance level (OT) Description: LTG: Patient will perform toileting task (3/3 steps) with assistance level (OT)  Outcome: Completed/Met   Problem: RH Toilet Transfers Goal: LTG Patient will perform toilet transfers w/assist (OT) Description: LTG: Patient will perform toilet transfers with assist, with/without cues using equipment (OT) Outcome: Completed/Met   Problem: RH Tub/Shower Transfers Goal: LTG Patient will perform tub/shower transfers w/assist (OT) Description: LTG: Patient will perform tub/shower transfers with assist, with/without cues using equipment (OT) Outcome: Completed/Met   Problem: RH Memory Goal: LTG Patient will demonstrate ability for day to day  recall/carry over during activities of daily living with assistance level (OT) Description: LTG:  Patient will demonstrate ability for day to day recall/carry over during activities of daily living with assistance level (OT). Outcome: Completed/Met

## 2023-02-13 NOTE — Plan of Care (Addendum)
I have read and reviewed the attached note and am in agreement with the documentation provided.           This licensed clinician was present and actively directing care throughout the  session at all times.        Judieth Keens PT, DPT, CSRS -----------------------------  Problem: RH Floor Transfers Goal: LTG Patient will perform floor transfers w/assist (PT) Description: LTG: Patient will perform floor transfers with assistance (PT). Outcome: Not Met (add Reason) Flowsheets (Taken 02/13/2023 1242) LTG: PT WILL PERFORM FLOOR TRANFERS  WITH  ASSIST:: Other (comment) Note: Do to pt's consistent lethargy, this was not the main focus of therapy sessions. May need to be a focus during [facility] therapies.    Problem: RH Balance Goal: LTG Patient will maintain dynamic sitting balance (PT) Description: LTG:  Patient will maintain dynamic sitting balance with assistance during mobility activities (PT) Outcome: Completed/Met Goal: LTG Patient will maintain dynamic standing balance (PT) Description: LTG:  Patient will maintain dynamic standing balance with assistance during mobility activities (PT) Outcome: Completed/Met   Problem: Sit to Stand Goal: LTG:  Patient will perform sit to stand with assistance level (PT) Description: LTG:  Patient will perform sit to stand with assistance level (PT) Outcome: Completed/Met   Problem: RH Bed Mobility Goal: LTG Patient will perform bed mobility with assist (PT) Description: LTG: Patient will perform bed mobility with assistance, with/without cues (PT). Outcome: Completed/Met   Problem: RH Bed to Chair Transfers Goal: LTG Patient will perform bed/chair transfers w/assist (PT) Description: LTG: Patient will perform bed to chair transfers with assistance (PT). Outcome: Completed/Met   Problem: RH Car Transfers Goal: LTG Patient will perform car transfers with assist (PT) Description: LTG: Patient will perform car transfers with assistance (PT). Outcome:  Completed/Met   Problem: RH Furniture Transfers Goal: LTG Patient will perform furniture transfers w/assist (OT/PT) Description: LTG: Patient will perform furniture transfers  with assistance (OT/PT). Outcome: Completed/Met   Problem: RH Ambulation Goal: LTG Patient will ambulate in controlled environment (PT) Description: LTG: Patient will ambulate in a controlled environment, # of feet with assistance (PT). Outcome: Completed/Met Goal: LTG Patient will ambulate in home environment (PT) Description: LTG: Patient will ambulate in home environment, # of feet with assistance (PT). Outcome: Completed/Met   Problem: RH Wheelchair Mobility Goal: LTG Patient will propel w/c in controlled environment (PT) Description: LTG: Patient will propel wheelchair in controlled environment, # of feet with assist (PT) Outcome: Completed/Met Goal: LTG Patient will propel w/c in home environment (PT) Description: LTG: Patient will propel wheelchair in home environment, # of feet with assistance (PT). Outcome: Completed/Met   Problem: RH Stairs Goal: LTG Patient will ambulate up and down stairs w/assist (PT) Description: LTG: Patient will ambulate up and down # of stairs with assistance (PT) Outcome: Completed/Met

## 2023-02-13 NOTE — Plan of Care (Deleted)
  Problem: RH Floor Transfers Goal: LTG Patient will perform floor transfers w/assist (PT) Description: LTG: Patient will perform floor transfers with assistance (PT). Outcome: Not Met (add Reason) Flowsheets (Taken 02/13/2023 1242) LTG: PT WILL PERFORM FLOOR TRANFERS  WITH  ASSIST:: Other (comment) Note: Do to pt's consistent lethargy, this was not the main focus of therapy sessions. May need to be a focus during [facility] therapies.

## 2023-02-13 NOTE — Progress Notes (Addendum)
Patient ID: Angela [name], female   DOB: 05/09/1986, 37 y.o.   MRN: [id number]  Sw spoke with patient's mother, Angela [name]. Angela [name] has expressed concerns of patient not being ready for d/c tomorrow. Mother feels as all of her cognitive concerns are new and not being addressed. Mother initially reported that she assisted patient with cognitive tasks previously , now reporting that patient completed these tasks independently. Mother also expressed that she feels as the pt is not receiving appropriate care from therapy and nursing staff. Mother expressed that nursing staff do not check patient blood sugars approprietly, patient has been found transferring to the bathroom independently, nursing staff waits for pt's mother to be present to feed patient meals. If mother is not present for feeding then patient does not eat and this leads to low blood sugars. Mother often find notes in the room that say "save" and the patient goes without eating.  Mother shares she has medical questions in reference to patients stroke that have not been addressed. Mother also feels like patient cognition is not being addressed and would like FU from physician or PA. Mother expressed she has not received education on injecting insulin at home. Mother was assisting with injection previously. SW will discuss with nursing.   Mother also expressed that she is the person bathing and getting patient ready for the day and this is not happening from nursing or therapy staff. Mother would like to ensure patient is medically ready for d/c based on her concerns. Sw will FU with the team. No additional questions or concerns.

## 2023-02-13 NOTE — Progress Notes (Addendum)
Patient ID: Angela [name], female   DOB: [birth year], 37 y.o.   MRN: [id number]  OP referral sent to [facility] on 2/22. PT OT SLP

## 2023-02-13 NOTE — Progress Notes (Signed)
Inpatient Rehabilitation Discharge Medication Review by a Pharmacist  A complete drug regimen review was completed for this patient to identify any potential clinically significant medication issues.  High Risk Drug Classes Is patient taking? Indication by Medication  Antipsychotic No   Anticoagulant No   Antibiotic No   Opioid No   Antiplatelet No   Hypoglycemics/insulin Yes Insulin- T2DM  Vasoactive Medication Yes Coreg, norvasc, diltiazem, hydralazine- HTN  Chemotherapy No   Other Yes Albuterol- asthma Buspar, lexapro- anxiety Aranesp- anemia 2/2 ESRD Gabapentin- neuropathic pain Fosrenol- hyperphosphatemia 2/2 ESRD Keppra- seizure ppx Synthroid- hypothyroidism Protonix- GERD     Type of Medication Issue Identified Description of Issue Recommendation(s)  Drug Interaction(s) (clinically significant)     Duplicate Therapy     Allergy     No Medication Administration End Date     Incorrect Dose     Additional Drug Therapy Needed     Significant med changes from prior encounter (inform family/care partners about these prior to discharge).    Other       Clinically significant medication issues were identified that warrant physician communication and completion of prescribed/recommended actions by midnight of the next day:  No  Name of provider notified for urgent issues identified:   Provider Method of Notification:     Pharmacist comments:   Time spent performing this drug regimen review (minutes):  30   Brelee Renk BS, PharmD, BCPS Clinical Pharmacist 02/14/2023 11:25 AM  Contact: [phone] after 3 PM  "Be curious, not judgmental..." -Jamal Maes

## 2023-02-13 NOTE — Progress Notes (Signed)
Speech Language Pathology Discharge Summary  Patient Details  Name: Angela [name] MRN: [id number] Date of Birth: June 18, 1986  Date of Discharge from SLP service:February 13, 2023  Today's Date: 02/13/2023 SLP Individual Time: [id number] SLP Individual Time Calculation (min): 30 min and Today's Date: 02/13/2023 SLP Missed Time: 15 Minutes Missed Time Reason: Patient fatigue  Skilled Therapeutic Interventions:  Skilled ST treatment focused on cognitive goals. Pt was greeted sleeping soundly on arrival requiring mod A verbal and tactile stimuli to rouse. Pt eventually able to sit up to EOB but continuously returned to supine position due to lethargy. SLP facilitated orientation with max A verbal/visual cues for use of external aid for day of week, month, date, year; utilized Social worker with max A to ID reasoning for hospitalization. Suspect processing and responses were impacted by lethargy. For example, when asked "what month is it?" pt provided a (incorrect) day of the week. Furthermore, when asked "what year is it?" pt also responded with either a day of the week or month despite max A multimodal cues and repetition. Pt recalled that her mother was present earlier, but minimal recall of their visit, and/or what she did in physical therapy approximately 30 minutes prior. Pt sustained attention to today's interaction for ~2 minute intervals requiring min A verbal redirection cues but attention span very limited. Pt eventually falling back asleep and unable to rouse in order to achieve any functional participation. SLP attempted to call pt's mother for education prior to discharge, however there was no answer. SLP provided the following handouts which were placed in pt's stroke education binder to take home: 1. Cognitive-communication deficits, 2. Memory strategies, 3. Living a brain healthy lifestyle, and 4. Diet recommendations and swallowing precautions as per MBS obtained on 2/23. OT had the  opportunity to provide education to pt's mother during session this a.m. and addressed cognitive considerations for pt's overall safety and wellbeing within home environment. Patient was left in bed with alarm activated and immediate needs within reach at end of session. Session was concluded 15 minutes early d/[name] fatigue.   Patient has met 3 of 8 long term goals.  Patient to discharge at [facility] Max;Mod level.  Reasons goals not met: Significant lethargy impacting functional participation   Clinical Impression/Discharge Summary:   Patient has made minimal gains and has met 3 of 8 long-term goals this admission. Progress was limited by significant lethargy impacting functional participation; carry over decreased 2/2 significant cognitive deficits impacting orientation, attention, functional recall, problem solving, and intellectual awareness. Level of assistance fluctuated and was dependent on level of alertness, however pt most consistently required mod-to-max A multimodal cues for cognitive tasks addressing the above mentioned cognitive domains. Pt did exhibit improved oropharyngeal swallow function as per bedside and MBS obtained on 02/10/23, and is currently consuming a regular texture diet with thin liquids with pills whole in puree. Pt currently requiring max A to recall swallowing precautions (small bites/sips, one sip at a time, avoid mixed consistencies, stay upright 30 minutes after intake) and min A to implement with meals. SLP provided pt with educational handouts regarding cognitive function, memory strategies, diet recommendations, and swallowing precautions. Patient's care partner is independent to provide the necessary physical and cognitive assistance at discharge. Patient would benefit from continued SLP services in outpatient setting to maximize cognitive function and functional independence.    Care Partner:  Caregiver Able to Provide Assistance: Yes  Type of Caregiver Assistance:  Physical;Cognitive  Recommendation:  Outpatient SLP;24 hour supervision/assistance  Rationale for  SLP Follow Up: Maximize cognitive function and independence;Maximize swallowing safety;Reduce caregiver burden   Equipment: None   Reasons for discharge: Discharged from hospital   Patient/Family Agrees with Progress Made and Goals Achieved: Yes    Angela [name] Angela [name] 02/13/2023, 3:45 PM

## 2023-02-13 NOTE — Progress Notes (Signed)
PROGRESS NOTE   Subjective/Complaints:  Persistent am drowsiness, w/u negative for new CVA , has had hypoglycemic episode   ROS: Unable to obtain due to lethargy, cognitive status  Objective:   MR BRAIN WO CONTRAST  Result Date: 02/11/2023 CLINICAL DATA:  Headache with acute neurologic deficit. EXAM: MRI HEAD WITHOUT CONTRAST TECHNIQUE: Multiplanar, multiecho pulse sequences of the brain and surrounding structures were obtained without intravenous contrast. COMPARISON:  Brain MRI 04/05/2022 Head CT 02/03/2023, 02/11/2023 FINDINGS: Brain: Unchanged appearance of intraparenchymal hematoma centered at the left caudate head with adjacent lateral ventricular extension. There is blood along the right frontal approach shunt tract. Mild surrounding edema. EVD has been removed. Normal white matter signal, parenchymal volume and CSF spaces. The midline structures are normal. Vascular: Normal flow voids. Skull and upper cervical spine: Normal marrow signal. Sinuses/Orbits: Right maxillary sinus retention cyst. Normal orbits. Other: None. IMPRESSION: Unchanged appearance of intraparenchymal hematoma centered at the left caudate head with adjacent lateral ventricular extension. Electronically Signed   By: Angela [name] M.D.   On: 02/11/2023 19:12   CT HEAD WO CONTRAST (5MM)  Result Date: 02/11/2023 CLINICAL DATA:  37 year old female who presented with hemorrhage into the left caudate and ventricular system on 01/27/2023 treated with EVD. EXAM: CT HEAD WITHOUT CONTRAST TECHNIQUE: Contiguous axial images were obtained from the base of the skull through the vertex without intravenous contrast. RADIATION DOSE REDUCTION: This exam was performed according to the departmental dose-optimization program which includes automated exposure control, adjustment of the mA and/or kV according to patient size and/or use of iterative reconstruction technique. COMPARISON:   Head CTs 02/03/2023 and earlier. FINDINGS: Brain: Interval removal of the external ventricular drain. Trace blood products in the tract through the frontal lobe. No ventriculomegaly, stable to further improve ventricle size. Fading hemorrhage at the left caudate, also decreased since 02/03/2023. Mild residual regional mass effect. No significant intracranial mass effect. Subtle hypodensity in the left cerebellum PICA territory is new since 01/30/2023 and persists from the prior exam. No associated mass effect. Basilar cisterns remain patent. Elsewhere stable gray-white differentiation elsewhere. Vascular: Calcified atherosclerosis at the skull base. No suspicious intracranial vascular hyperdensity. Skull: Right frontal burr hole. No acute osseous abnormality identified. Sinuses/Orbits: Continued sinus fluid or mucosal thickening but mildly improved aeration. Tympanic cavities and mastoids remain clear. Other: Left nasoenteric tube is probably been removed. Postoperative changes to the superior right scalp. Negative orbits. IMPRESSION: 1. Appearance suspicious for a subtle, subacute Left PICA cerebellar infarct. No associated hemorrhage or mass effect. MRI might be valuable to confirm. 2. Interval removal of the external ventricular drain with no adverse features. Slowly resolving left caudate hemorrhage. These results will be called to the ordering clinician or representative by the Radiologist Assistant, and communication documented in the PACS or [employer]. Electronically Signed   By: Angela [name] M.D.   On: 02/11/2023 13:23   Recent Labs    02/11/23 1207  WBC 11.1*  HGB 8.7*  HCT 26.5*  PLT 173    Recent Labs    02/11/23 1207  NA 130*  K 3.7  CL 92*  CO2 29  GLUCOSE 76  BUN 15  CREATININE 5.77*  CALCIUM 9.8     Intake/Output Summary (Last 24 hours) at 02/13/2023 0839 Last data filed at 02/13/2023 0700 Gross per 24 hour  Intake 354 ml  Output --  Net 354 ml         Physical  Exam: Vital Signs Blood pressure (!) 141/80, pulse 80, temperature 98.3 F (36.8 C), resp. rate 17, height 5' 4.96" (1.65 m), weight 69.3 kg, SpO2 99 %.  Drowsy, does not open eyes, minimally responsive to physical stimuli, nonresponsive to verbal stimuli.  With persistent cueing, does swing legs over bed and perform stand pivot transfer with PT.  Keeps eyes closed throughout.  General: No acute distress Mood and affect are appropriate Heart: Regular rate and rhythm no rubs murmurs or extra sounds. + Right JVD Lungs: Clear to auscultation, breathing unlabored, no rales or wheezes Abdomen: Positive bowel sounds, tender to palpation along left lower rib cage - unchanged, nondistended Extremities: No clubbing, cyanosis, or edema MSK:  RIght knee without abrasion or ecchymosis , no pain to palp or with ROM   Skin:LUE AVF Neuro: Oriented x4 with use of room cues.  All 4 extremities antigravity and against resistance, reacts to stimuli in all 4 extremities.  Does not participate in cranial nerve testing.  Assessment/Plan: 1. Functional deficits which require 3+ hours per day of interdisciplinary therapy in a comprehensive inpatient rehab setting. Physiatrist is providing close team supervision and 24 hour management of active medical problems listed below. Physiatrist and rehab team continue to assess barriers to discharge/monitor patient progress toward functional and medical goals  Care Tool:  Bathing    Body parts bathed by patient: Right arm, Left arm, Chest, Abdomen, Front perineal area, Buttocks, Right upper leg, Left upper leg, Right lower leg, Left lower leg, Face         Bathing assist Assist Level: Set up assist     Upper Body Dressing/Undressing Upper body dressing   What is the patient wearing?: Pull over shirt    Upper body assist Assist Level: Set up assist    Lower Body Dressing/Undressing Lower body dressing      What is the patient wearing?: Pants     Lower  body assist Assist for lower body dressing: Set up assist     Toileting Toileting Toileting Activity did not occur (Clothing management and hygiene only): N/A (no void or bm)  Toileting assist Assist for toileting: Contact Guard/Touching assist     Transfers Chair/bed transfer  Transfers assist     Chair/bed transfer assist level: Contact Guard/Touching assist     Locomotion Ambulation   Ambulation assist      Assist level: Minimal Assistance - Patient > 75% Assistive device: Other (comment) (at gait belt and L shoulder) Max distance: 161   Walk 10 feet activity   Assist     Assist level: Minimal Assistance - Patient > 75% Assistive device: Other (comment) (at gait belt and L shoulder)   Walk 50 feet activity   Assist    Assist level: Minimal Assistance - Patient > 75% Assistive device: Other (comment) (at gait belt and L shoulder)    Walk 150 feet activity   Assist    Assist level: Minimal Assistance - Patient > 75% Assistive device: Other (comment) (at gait belt and L shoulder)    Walk 10 feet on uneven surface  activity   Assist     Assist level: Minimal Assistance - Patient > 75% (ramp) Assistive device: Other (comment) (at gait belt and  L shoulder and does reach for external support if available.)   Wheelchair     Assist Is the patient using a wheelchair?: Yes Type of Wheelchair: Manual    Wheelchair assist level: Supervision/Verbal cueing Max wheelchair distance: 100    Wheelchair 50 feet with 2 turns activity    Assist    Wheelchair 50 feet with 2 turns activity did not occur:  (per PT documentation)   Assist Level: Supervision/Verbal cueing   Wheelchair 150 feet activity     Assist      Assist Level: Moderate Assistance - Patient 50 - 74% (per PT documentation of 100 feet max distance)   Blood pressure (!) 141/80, pulse 80, temperature 98.3 F (36.8 C), resp. rate 17, height 5' 4.96" (1.65 m), weight 69.3  kg, SpO2 99 %.  Medical Problem List and Plan: 1. Functional deficits secondary to [place] left caudate with resolving IVH - uncontrolled              -patient may shower             -ELOS/Goals: 02/14/23 S            -may need to adjust HD schedule to later in day , family will need to transport Discussed medical issues with mom who is at bedside    2.  Antithrombotics: -DVT/anticoagulation:  Pharmaceutical: Heparin             -antiplatelet therapy: none   3. Pain Management: Tylenol as needed Intermittent HA, expected post IVH , no hydrocephalus on repeat scan    4. Mood/Behavior/Sleep: LCSW to evaluate and provide emotional support             -depression/anxiety: continue Buspar and Lexapro             -antipsychotic agents: n/a   5. Neuropsych/cognition: This patient not capable of making decisions on her own behalf.   -May benefit from Ritalin or modafinil to increase arousal, however as she does seem to arouse enough to participate in therapies and is close to discharge, also has hx of severe anxiety   -  6. Skin/Wound Care: Routine skin care checks   7. Fluids/Electrolytes/Nutrition: strict Is and Os; chemistries as per nephrology             -TF d/c ed             -continue Fosrenol, MVI, Aranesp   8: Hypertension: monitor TID and prn (home Lasix, losartan held>>other home meds restarted             -continue amlodipine 10 mg daily             -diltiazem 60 mg TID             -hydralazine 100 mg TID     Vitals:   02/12/23 2030 02/13/23 0430  BP: 126/83 (!) 141/80  Pulse: 74 80  Resp: 16 17  Temp: 98.2 F (36.8 C) 98.3 F (36.8 C)  SpO2: 100% 99%  Labile pulse mildly elevated and BP still up will start low dose coreg, may titrate this up and reduce diltiazem - 2/24: Pulse consistently in 80s to 90s, increase Coreg for blood pressure control with improvements this afternoon - BP much imporved  9: Hyperlipidemia: home statin held   10: Hydrocephalus: resolved; EVD  placed 2/09, removed 2/16 -CT head, MRI stable 2/24   11: ESRD: HD on M/W/F via LUE AVF   12: DM-1; uncontolled: continue CBGs  q 4 hours. Sensitive sliding scale changed to very sensitive given CBG of 32 on admission -decrease Semglee to 12 units daily -> 8U daily due to persistent hypoglycemia and recurrent hypoglycemic episodes -> 10U d/t hyperglycemia 2/25 -reduce novalog to  1U TID novolog with meals as per diabetic coordinator recommendations. Hold if po intake <50%   CBG (last 3)  Recent Labs    02/13/23 0029 02/13/23 0427 02/13/23 0806  GLUCAP 144* 205* 379*   Semglee down to 8U elevated am CBG , increase to 10U, rec endo f/u , has insulin pump but did not have it calibrated yet due to CVA    13: Seizure disorder: home dose Keppra resumed             continue Keppra 500 mg BID             -gabapentin 100 mg TID-unable to increase due to creatinine clearance  may be contributing to drowsiness  -will hold off on medication changes that may reduce seizure threshold at this time  14: Hypothyroidism: continue Synthroid   15: Anemia, chronic disease: s/p 1 unit PCs, follow-up CBC    LOS: 9 days A FACE TO FACE EVALUATION WAS PERFORMED  Charlett Blake 02/13/2023, 8:39 AM

## 2023-02-13 NOTE — Progress Notes (Signed)
Occupational Therapy Session Note  Patient Details  Name: Angela [name] MRN: [id number] Date of Birth: [birth year]  Today's Date: 02/13/2023 OT Individual Time: [id number] OT Individual Time Calculation (min): 70 min  and Today's Date: 02/13/2023 OT Missed Time: 20 Minutes Missed Time Reason: Patient fatigue   Short Term Goals: Week 1:  OT Short Term Goal 1 (Week 1): STGs=LTGs due to patient's length of stay.  Skilled Therapeutic Interventions/Progress Updates:    At start of session, pt sound asleep snoring and her mom was present.  Family education with mom on pt's status, lowering of mod I goals to CGA /S based on her lethargy, challenges with participation. Mom stated pt did sleep a lot at home but is more lethargic now.  She stated pt is up all night so she is sleeping all day.  Tried numerous times to wake pt up and then had to rub her feet to wake her as MD arrived for assessment.   After he left, pt sat up with max coaxing to get into the shower. She said she had to lay down for 10 minutes, negotiated 5 min with pt.  Talked to mom about pt's interests in music and finding a way to tap into those at home, her lack of engagement with home tasks and recommended pt be given a chore list of at least 1 chore a day so she feels a sense of purpose. Showed her mom the tub bench and how to set it up.    Pt finally got up with max coaxing and ambulated with close S to bathroom to toilet and then get in shower with S.  Pt bathed on tub bench after Set up.  She dried off , applied lotion with no A.  Ambulated to EOB to dress with no A after set up. Pt engaged in MMT and coordination testing and then stated she was too tired to keep participating, she needed to rest. Pt resting in bed with all needs met and alarm set.     Therapy Documentation Precautions:  Precautions Precautions: Fall Precaution Comments: left side weakness, HD on MWF schedule, Restrictions Weight Bearing Restrictions:  No  Pain: Pain Assessment Pain Scale: 0-10 Pain Score: 0-No pain ADL: ADL Eating: Independent Where Assessed-Eating: Bed level Grooming: Independent Upper Body Bathing: Setup Where Assessed-Upper Body Bathing: Shower Lower Body Bathing: Setup Where Assessed-Lower Body Bathing: Shower Upper Body Dressing: Setup Where Assessed-Upper Body Dressing: Chair Lower Body Dressing: Setup Where Assessed-Lower Body Dressing: Chair Toileting: Setup Where Assessed-Toileting: Bedside Commode Toilet Transfer: Close supervision Toilet Transfer Method: Counselling psychologist: Other (comment) (toilet) Tub/Shower Transfer: Close supervison Tub/Shower Transfer Method: Optometrist: Facilities manager: Close supervision Social research officer, government Method: Heritage manager: Radio broadcast assistant ADL Comments: Pt requiring encouragement for independence.  Therapy/Group: Individual Therapy  [place] 02/13/2023, 9:58 AM

## 2023-02-13 NOTE — Plan of Care (Signed)
  Problem: RH Swallowing Goal: LTG Patient will consume least restrictive diet using compensatory strategies with assistance (SLP) Description: LTG:  Patient will consume least restrictive diet using compensatory strategies with assistance (SLP) Outcome: Not Met (add Reason) Note: Min A verbal cues needed during meals Goal: LTG Patient will participate in dysphagia therapy to increase swallow function with assistance (SLP) Description: LTG:  Patient will participate in dysphagia therapy to increase swallow function with assistance (SLP) Outcome: Not Met (add Reason) Note: Min A verbal cues   Problem: RH Problem Solving Goal: LTG Patient will demonstrate problem solving for (SLP) Description: LTG:  Patient will demonstrate problem solving for basic/complex daily situations with cues  (SLP) Outcome: Not Met (add Reason)   Problem: RH Memory Goal: LTG Patient will use memory compensatory aids to (SLP) Description: LTG:  Patient will use memory compensatory aids to recall biographical/new, daily complex information with cues (SLP) Outcome: Not Met (add Reason)   Problem: RH Awareness Goal: LTG: Patient will demonstrate awareness during functional activites type of (SLP) Description: LTG: Patient will demonstrate awareness during functional activites type of (SLP) Outcome: Not Met (add Reason)   Problem: RH Swallowing Goal: LTG Pt will demonstrate functional change in swallow as evidenced by bedside/clinical objective assessment (SLP) Description: LTG: Patient will demonstrate functional change in swallow as evidenced by bedside/clinical objective assessment (SLP) Outcome: Completed/Met   Problem: RH Expression Communication Goal: LTG Patient will increase word finding of common (SLP) Description: LTG:  Patient will increase word finding of common objects/daily info/abstract thoughts with cues using compensatory strategies (SLP). Outcome: Completed/Met   Problem: RH Attention Goal: LTG  Patient will demonstrate this level of attention during functional activites (SLP) Description: LTG:  Patient will will demonstrate this level of attention during functional activites (SLP) Outcome: Completed/Met

## 2023-02-13 NOTE — Progress Notes (Signed)
Post Hemodialysis   02/13/23 2032  Vitals  Temp 98.4 F (36.9 C)  Pulse Rate 83  Resp 17  BP (!) 153/77  SpO2 98 %  O2 Device Room Air  Weight 63.7 kg  Type of Weight Post-Dialysis  Oxygen Therapy  Patient Activity (if Appropriate) In bed  Pulse Oximetry Type Continuous  Oximetry Probe Site Changed No  Post Treatment  Dialyzer Clearance Lightly streaked  Duration of HD Treatment -hour(s) 3.5 hour(s)  Liters Processed 62.5  Fluid Removed (mL) 2500 mL  Tolerated HD Treatment Yes  AVG/AVF Arterial Site Held (minutes) 8 minutes  AVG/AVF Venous Site Held (minutes) 3 minutes

## 2023-02-13 NOTE — Progress Notes (Signed)
Physical Therapy Discharge Summary  Patient Details  Name: Angela [name] MRN: [id number] Date of Birth: 1986-03-19  Date of Discharge from PT service:February 13, 2023  Today's Date: 02/13/2023 PT Individual Time: 1102-1155 PT Individual Time Calculation (min): 53 min    Patient has met 12 of 13 long term goals due to improved balance, improved postural control, increased strength, functional use of  right lower extremity, and improved coordination.  Patient to discharge at an ambulatory level Supervision.   Patient's care partner is independent to provide the necessary physical and cognitive assistance at discharge.  Reasons goals not met: Floor transfers was not the main focus of PT sessions due to pt's consistent lethargy.   Recommendation:  Patient will benefit from ongoing skilled PT services in home health setting to continue to advance safe functional mobility, address ongoing impairments in gait, postural control, balance, strength, safety awareness, and minimize fall risk.  Equipment: No equipment provided  Reasons for discharge: treatment goals met and discharge from hospital  Patient/family agrees with progress made and goals achieved: Yes  Pt's grad day assessment conducted as stated below.  Pt received supine in bed asleep. Pt was slow to arouse out of bed but was agreeable to PT. Pt remained lethargic throughout today's session. Pt donned tennis shoes w/ constant cueing from therapist to encourage pt to participate. Once donned, pt ambulated to [facility] w/ supervision assist.   Wheelchair mobility ~143f independently w/ therapist providing distant supervision. Pt navigated WC w/ BUE to the ortho gym. Pt began to cough and expressed that she needed to vomit. After vomiting, pt was still willing to participate. Car transfer was supervision A w/ no AD. Pt ascended and descended 12 stairs w/ supervision A and LHR. Therapist cued pt to take her time on descent to avoid LOB and  falling. In therapy apartment, pt was able to transfer stand>sit>stand from couch w/ supervision A and required no vc. Pt participated in ~150 ft of gait training back to room w/ no AD and supervision A. Noted narrow BOS and postural sway laterally with horizontal head turns when ambulating. Pt cued to increase lateral step of R foot to avoid it crossing midline and focus on the task of to prevent LOB. Pt returned to supine in bed w/o assist and was left w/ bed alarm on, call bell in reach, and all needs met.   PT Discharge Precautions/Restrictions Precautions Precautions: Fall Restrictions Weight Bearing Restrictions: No Vital Signs  Pain Pain Assessment Pain Scale: 0-10 Pain Score: 0-No pain Pain Interference Pain Interference Pain Effect on Sleep: 0. Does not apply - I have not had any pain or hurting in the past 5 days Pain Interference with Therapy Activities: 0. Does not apply - I have not received rehabilitationtherapy in the past 5 days Pain Interference with Day-to-Day Activities: 1. Rarely or not at all Vision/Perception  Vision - History Ability to See in Adequate Light: 0 Adequate Perception Perception: Within Functional Limits Praxis Praxis: Intact  Cognition Overall Cognitive Status: Impaired/Different from baseline Arousal/Alertness: Lethargic Orientation Level: Oriented to person;Oriented to place Memory: Impaired Memory Impairment: Storage deficit;Retrieval deficit;Decreased recall of new information;Decreased short term memory Awareness: Impaired Awareness Impairment: Emergent impairment Sensation Sensation Light Touch: Appears Intact Hot/Cold: Appears Intact Proprioception: Appears Intact Stereognosis: Appears Intact Coordination Gross Motor Movements are Fluid and Coordinated: No Fine Motor Movements are Fluid and Coordinated: Yes Coordination and Movement Description: Difficulty maintiaing COB when ambulating, generalized weakness Finger Nose Finger  Test: [facility]  Motor  Motor: Hemiplegia Motor - Skilled Clinical Observations: R sided weakness in LE only, UEs WFL Motor - Discharge Observations: Mild RLE weakness  Mobility Bed Mobility Bed Mobility: Rolling Right;Rolling Left;Supine to Sit;Sitting - Scoot to [employer] of Bed;Sit to Supine Rolling Right: Independent with assistive device Rolling Left: Independent with assistive device Supine to Sit: Independent with assistive device Sitting - Scoot to Edge of Bed: Independent with assistive device Sit to Supine: Independent with assistive device Transfers Transfers: Sit to Stand;Stand to Sit;Stand Pivot Transfers Sit to Stand: Supervision/Verbal cueing Stand to Sit: Supervision/Verbal cueing Stand Pivot Transfers: Supervision/Verbal cueing Stand Pivot Transfer Details: Verbal cues for precautions/safety Transfer (Assistive device): None Locomotion  Gait Ambulation: Yes Gait Assistance: Supervision/Verbal cueing Gait Distance (Feet): 150 Feet Assistive device: None Gait Assistance Details: Verbal cues for precautions/safety;Verbal cues for technique Gait Gait: Yes Gait Pattern: Impaired Gait Pattern: Narrow base of support Stairs / Additional Locomotion Stairs: Yes Stairs Assistance: Supervision/Verbal cueing Stair Management Technique: One rail Left Number of Stairs: 12 Height of Stairs: 6 Ramp: Supervision/Verbal cueing Curb: Supervision/Verbal cueing Pick up small object from the floor assist level: Supervision/Verbal cueing Wheelchair Mobility Wheelchair Mobility: Yes Wheelchair Assistance: Independent with Camera operator: Both upper extremities Distance: 175f  Trunk/Postural Assessment  Cervical Assessment Cervical Assessment: Within Functional Limits Thoracic Assessment Thoracic Assessment: Within Functional Limits Lumbar Assessment Lumbar Assessment: Within Functional Limits Postural Control Postural Control: Deficits on evaluation  (postural sway to the L when ambulating, LOB to the L w/ horizontal head turns when ambulating)  Balance Balance Balance Assessed: Yes Static Sitting Balance Static Sitting - Balance Support: Feet supported Static Sitting - Level of Assistance: 7: Independent Dynamic Sitting Balance Dynamic Sitting - Balance Support: During functional activity Dynamic Sitting - Level of Assistance: 7: Independent Static Standing Balance Static Standing - Balance Support: During functional activity Static Standing - Level of Assistance: 5: Stand by assistance Dynamic Standing Balance Dynamic Standing - Balance Support: During functional activity Dynamic Standing - Level of Assistance: 5: Stand by assistance Extremity Assessment  RUE Assessment RUE Assessment: Within Functional Limits LUE Assessment LUE Assessment: Within Functional Limits RLE Assessment RLE Assessment: Exceptions to [facility] Strength Comments: grossly 4/5 throughout RLE LLE Assessment LLE Assessment: Within Functional Limits   Tekoa Hamor 02/13/2023, 12:51 PM

## 2023-02-13 NOTE — Progress Notes (Signed)
Received a call from [place] that pt's mother had reached out to [facility] regarding changing pt's clinic. Pt's mother requesting assistance with process. Spoke to pt's mother via phone. Mother confirms that she needs pt to be at a local [facility] clinic if possible. Pt's mother does not drive and pt will need assistance with transportation to/from HD appts. Mother spoke to out-pt HD clinic social worker last week regarding needs. Navigator spoke to clinic social worker this morning to inquire if a referral has been made to [facility] on pt's behalf. Referral has not been made and clinic social worker agreeable to navigator making referral to [facility] admissions today. Clinic social worker aware she will need to provide certain information once [facility] contacts clinic for records. Referral submitted this morning for review. Rehab CSW advised that pt's mother states pt will need assistance with transportation to/from HD at d/c. CSW to assist with this matter.   Melven Sartorius Renal Navigator [phone]

## 2023-02-13 NOTE — Progress Notes (Signed)
Inpatient Rehabilitation Care Coordinator Discharge Note   Patient Details  Name: Angela [name] MRN: [id number] Date of Birth: 1986/11/03   Discharge location: Home with mother  Length of Stay: 10 Days  Discharge activity level: Supervision  Home/community participation: Mother, aunt and father  Patient response [id number] Literacy - How often do you need to have someone help you when you read instructions, pamphlets, or other written material from your doctor or pharmacy?: Often  Patient response [id number] Isolation - How often do you feel lonely or isolated from those around you?: Never  Services provided included: SW, Neuropsych, Pharmacy, TR, CM, RN, SLP, OT, PT, RD, MD  Financial Services:  Financial Services Utilized: Medicaid    Choices offered to/list presented to:    Follow-up services arranged:  Outpatient, DME    Outpatient Servicies: [facility]- PT OT SLP DME : [facility] TRANSFER [facility]- ADAPT    Patient response to transportation need: Is the patient able to respond to transportation needs?: Yes In the past 12 months, has lack of transportation kept you from medical appointments or from getting medications?: No In the past 12 months, has lack of transportation kept you from meetings, work, or from getting things needed for daily living?: No    Comments (or additional information):  Patient/Family verbalized understanding of follow-up arrangements:  Yes  Individual responsible for coordination of the follow-up plan: [name], Mother  [phone]  Confirmed correct DME delivered: Dyanne Iha 02/13/2023    Dyanne Iha

## 2023-02-14 ENCOUNTER — Other Ambulatory Visit (HOSPITAL_COMMUNITY): Payer: Self-pay

## 2023-02-14 ENCOUNTER — Inpatient Hospital Stay (HOSPITAL_COMMUNITY): Payer: Medicaid Other

## 2023-02-14 LAB — GLUCOSE, CAPILLARY
Glucose-Capillary: 135 mg/dL — ABNORMAL HIGH (ref 70–99)
Glucose-Capillary: 147 mg/dL — ABNORMAL HIGH (ref 70–99)
Glucose-Capillary: 243 mg/dL — ABNORMAL HIGH (ref 70–99)
Glucose-Capillary: 320 mg/dL — ABNORMAL HIGH (ref 70–99)
Glucose-Capillary: 365 mg/dL — ABNORMAL HIGH (ref 70–99)
Glucose-Capillary: 379 mg/dL — ABNORMAL HIGH (ref 70–99)

## 2023-02-14 MED ORDER — BISACODYL 10 MG RE SUPP
10.0000 mg | Freq: Once | RECTAL | Status: AC
  Administered 2023-02-14: 10 mg via RECTAL
  Filled 2023-02-14: qty 1

## 2023-02-14 MED ORDER — LIDOCAINE 5 % EX PTCH: 1.0000 | MEDICATED_PATCH | CUTANEOUS | 0 refills | Status: DC

## 2023-02-14 MED ORDER — AMLODIPINE BESYLATE 10 MG PO TABS
10.0000 mg | ORAL_TABLET | Freq: Every day | ORAL | 0 refills | Status: DC
  Filled 2023-02-14: qty 30, 30d supply, fill #0

## 2023-02-14 MED ORDER — DILTIAZEM HCL 60 MG PO TABS
60.0000 mg | ORAL_TABLET | Freq: Four times a day (QID) | ORAL | 0 refills | Status: DC
  Filled 2023-02-14: qty 120, 30d supply, fill #0

## 2023-02-14 MED ORDER — LEVETIRACETAM 500 MG PO TABS
500.0000 mg | ORAL_TABLET | Freq: Two times a day (BID) | ORAL | 0 refills | Status: DC
  Filled 2023-02-14: qty 60, 30d supply, fill #0

## 2023-02-14 MED ORDER — PANTOPRAZOLE SODIUM 40 MG PO TBEC
40.0000 mg | DELAYED_RELEASE_TABLET | Freq: Every day | ORAL | 0 refills | Status: DC
  Filled 2023-02-14: qty 30, 30d supply, fill #0

## 2023-02-14 MED ORDER — LANTHANUM CARBONATE 1000 MG PO CHEW
1000.0000 mg | CHEWABLE_TABLET | Freq: Three times a day (TID) | ORAL | 0 refills | Status: DC
  Filled 2023-02-14: qty 90, 30d supply, fill #0

## 2023-02-14 MED ORDER — SENNOSIDES-DOCUSATE SODIUM 8.6-50 MG PO TABS: 1.0000 | ORAL_TABLET | Freq: Two times a day (BID) | ORAL | Status: DC

## 2023-02-14 MED ORDER — LANTHANUM CARBONATE 1000 MG PO CHEW: 1000.0000 mg | CHEWABLE_TABLET | Freq: Three times a day (TID) | ORAL | 0 refills | Status: DC

## 2023-02-14 MED ORDER — ACETAMINOPHEN 325 MG PO TABS: 650.0000 mg | ORAL_TABLET | Freq: Four times a day (QID) | ORAL | Status: DC | PRN

## 2023-02-14 MED ORDER — GABAPENTIN 100 MG PO CAPS
100.0000 mg | ORAL_CAPSULE | Freq: Three times a day (TID) | ORAL | 0 refills | Status: DC
  Filled 2023-02-14: qty 90, 30d supply, fill #0

## 2023-02-14 MED ORDER — CARVEDILOL 6.25 MG PO TABS
6.2500 mg | ORAL_TABLET | Freq: Two times a day (BID) | ORAL | 0 refills | Status: DC
  Filled 2023-02-14: qty 60, 30d supply, fill #0

## 2023-02-14 NOTE — Progress Notes (Signed)
Patient ID: Angela [name], female   DOB: 11/10/1986, 37 y.o.   MRN: [id number]  SW and renal navigator still waiting on determination from [facility] for HD fu. Patient D/C moved to 2/28.

## 2023-02-14 NOTE — Progress Notes (Addendum)
Patient ID: Angela [name], female   DOB: May 06, 1986, 37 y.o.   MRN: [id number]  Sw received FU from [place]. [facility] is still awaiting info from pt's home clinic. her referral is still pending as of this morning.   SW informed family in room.   12:01PM: SW will provide an update once coordinator is able to provide.  1:29 PM. Chipper Oman has not received any updates. Waiting for a FU.

## 2023-02-14 NOTE — Progress Notes (Addendum)
Pt's referral with [facility] is still pending at this time. Pt's home clinic has provided needed documents to [facility] admissions per clinic social worker. Awaiting final approval/clearance from [facility]. Will provide update to CSW as updates are received.   Melven Sartorius Renal Navigator [phone]  Addendum at 5:26 pm: Awaiting final approval for out-pt HD clinic in [place].

## 2023-02-14 NOTE — Progress Notes (Signed)
PROGRESS NOTE   Subjective/Complaints:  No issues overnite , nauseated this am trying to have a BM last noc, no abd pain no vomiting   Mom is ready to take pt home   ROS: Unable to obtain due to lethargy, cognitive status  Objective:   No results found. Recent Labs    02/11/23 1207  WBC 11.1*  HGB 8.7*  HCT 26.5*  PLT 173    Recent Labs    02/11/23 1207  NA 130*  K 3.7  CL 92*  CO2 29  GLUCOSE 76  BUN 15  CREATININE 5.77*  CALCIUM 9.8     Intake/Output Summary (Last 24 hours) at 02/14/2023 0753 Last data filed at 02/13/2023 2200 Gross per 24 hour  Intake 720 ml  Output 2500 ml  Net -1780 ml         Physical Exam: Vital Signs Blood pressure 133/74, pulse 83, temperature 98.2 F (36.8 C), temperature source Oral, resp. rate 18, height 5' 4.96" (1.65 m), weight 64.5 kg, SpO2 96 %.    General: No acute distress Mood and affect are appropriate Heart: Regular rate and rhythm no rubs murmurs or extra sounds. + Right JVD Lungs: Clear to auscultation, breathing unlabored, no rales or wheezes Abdomen: Positive bowel sounds, tender to palpation along left lower rib cage - unchanged, nondistended Extremities: No clubbing, cyanosis, or edema MSK:  RIght knee without abrasion or ecchymosis , no pain to palp or with ROM   Skin:LUE AVF Neuro: Oriented x4 with use of room cues.  All 4 extremities antigravity and against resistance, reacts to stimuli in all 4 extremities.  Does not participate in cranial nerve testing.  Assessment/Plan: 1. Functional deficits which require 3+ hours per day of interdisciplinary therapy in a comprehensive inpatient rehab setting. Physiatrist is providing close team supervision and 24 hour management of active medical problems listed below. Physiatrist and rehab team continue to assess barriers to discharge/monitor patient progress toward functional and medical goals  Care  Tool:  Bathing    Body parts bathed by patient: Right arm, Left arm, Chest, Abdomen, Front perineal area, Buttocks, Right upper leg, Left upper leg, Right lower leg, Left lower leg, Face         Bathing assist Assist Level: Set up assist     Upper Body Dressing/Undressing Upper body dressing   What is the patient wearing?: Pull over shirt    Upper body assist Assist Level: Set up assist    Lower Body Dressing/Undressing Lower body dressing      What is the patient wearing?: Underwear/pull up, Pants     Lower body assist Assist for lower body dressing: Set up assist     Toileting Toileting Toileting Activity did not occur (Clothing management and hygiene only): N/A (no void or bm)  Toileting assist Assist for toileting: Set up assist     Transfers Chair/bed transfer  Transfers assist     Chair/bed transfer assist level: Supervision/Verbal cueing     Locomotion Ambulation   Ambulation assist      Assist level: Supervision/Verbal cueing Assistive device: Other (comment) (at gait belt and L shoulder) Max distance: 163f   Walk 10  feet activity   Assist     Assist level: Supervision/Verbal cueing Assistive device: No Device   Walk 50 feet activity   Assist    Assist level: Supervision/Verbal cueing Assistive device: No Device    Walk 150 feet activity   Assist    Assist level: Supervision/Verbal cueing Assistive device: No Device    Walk 10 feet on uneven surface  activity   Assist     Assist level: Supervision/Verbal cueing Assistive device: Other (comment) (at gait belt and L shoulder and does reach for external support if available.)   Wheelchair     Assist Is the patient using a wheelchair?: Yes Type of Wheelchair: Manual    Wheelchair assist level: Independent Max wheelchair distance: 177f    Wheelchair 50 feet with 2 turns activity    Assist    Wheelchair 50 feet with 2 turns activity did not occur:   (per PT documentation)   Assist Level: Independent   Wheelchair 150 feet activity     Assist      Assist Level: Independent   Blood pressure 133/74, pulse 83, temperature 98.2 F (36.8 C), temperature source Oral, resp. rate 18, height 5' 4.96" (1.65 m), weight 64.5 kg, SpO2 96 %.  Medical Problem List and Plan: 1. Functional deficits secondary to [place] caud[place] with resolving IVH - uncontrolled              -patient may shower             -ELOS/Goals: 02/14/23 S               2.  Antithrombotics: -DVT/anticoagulation:  Pharmaceutical: Heparin             -antiplatelet therapy: none   3. Pain Management: Tylenol as needed Intermittent HA, expected post IVH , no hydrocephalus on repeat scan    4. Mood/Behavior/Sleep: LCSW to evaluate and provide emotional support             -depression/anxiety: continue Buspar and Lexapro             -antipsychotic agents: n/a   5. Neuropsych/cognition: This patient not capable of making decisions on her own behalf.   Am lethargy but does arouse to physical stim,   -  6. Skin/Wound Care: Routine skin care checks   7. Fluids/Electrolytes/Nutrition: strict Is and Os; chemistries as per nephrology             -TF d/c ed             -continue Fosrenol, MVI, Aranesp   8: Hypertension: monitor TID and prn (home Lasix, losartan held>>other home meds restarted             -continue amlodipine 10 mg daily             -diltiazem 60 mg TID             -hydralazine 100 mg TID     Vitals:   02/13/23 2140 02/14/23 0447  BP: (!) 142/85 133/74  Pulse: 87 83  Resp: 18 18  Temp: 98.4 F (36.9 C) 98.2 F (36.8 C)  SpO2: 99% 96%  Labile pulse mildly elevated and BP still up will start low dose coreg, may titrate this up and reduce diltiazem - 2/24: Pulse consistently in 80s to 90s, increase Coreg for blood pressure control with improvements this afternoon - BP much imporved  9: Hyperlipidemia: home statin held   10: Hydrocephalus:  resolved; EVD placed  2/09, removed 2/16 -CT head, MRI stable 2/24   11: ESRD: HD on M/W/F via LUE AVF   12: DM-1; uncontolled: continue CBGs q 4 hours. Sensitive sliding scale changed to very sensitive given CBG of 32 on admission -decrease Semglee to 12 units daily -> 8U daily due to persistent hypoglycemia and recurrent hypoglycemic episodes -> 10U d/t hyperglycemia 2/25 -reduce novalog to  1U TID novolog with meals as per diabetic coordinator recommendations. Hold if po intake <50%   CBG (last 3)  Recent Labs    02/13/23 2349 02/14/23 0447 02/14/23 0732  GLUCAP 254* 320* 379*   Semglee down to 10U elevated am CBG , increase to 12U, rec endo f/u , has insulin pump but did not have it calibrated yet due to CVA - mom needs to learn insulin adminstration    13: Seizure disorder: home dose Keppra resumed             continue Keppra 500 mg BID             -gabapentin 100 mg TID-unable to increase due to creatinine clearance  may be contributing to drowsiness  -will hold off on medication changes that may reduce seizure threshold at this time  14: Hypothyroidism: continue Synthroid   15: Anemia, chronic disease: s/p 1 unit PCs, follow-up CBC  16.  Nausea , constipation check KUB prior to discharge , if showing stool in left colon will give dulc supp   LOS: 10 days A FACE TO FACE EVALUATION WAS PERFORMED  Charlett Blake 02/14/2023, 7:53 AM

## 2023-02-14 NOTE — Progress Notes (Signed)
  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 Code stroke on 2/24 d/t increased lethargy. Hypoglycemia ongoing--per primary service. Neuro consulted -Brain MRI w/o acute changes.  ESRD: Continue HD on MWF schedule - no added heparin. 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 Phos improving, continue lanthanum as binder Acute hypoxic respiratory failure: Extubated. Resolved.  Hypertension: BP acceptable. On diltiazem, amlodipine, hydralazine. UF as able.  Dispo: dc date plan for 2/27. Apparently patient's Mother is requesting her to be transferred to another HD clinic in [place]; sw following  Outpatient Dialysis Orders: MWF [facility] [address] 3.5hr, EDW 62kg, 2K/2.5Ca bath, LUE AVF, Prev was getting Hep 2600 + 500 u/hr - now held  Subjective:   Patient seen and examined bedside. No acute events. Tolerated hd yesterday with net uf 2.5L   Objective:   BP 133/74 (BP Location: Right Arm)   Pulse 83   Temp 98.2 F (36.8 C) (Oral)   Resp 18   Ht 5' 4.96" (1.65 m)   Wt 64.5 kg   SpO2 96%   BMI 23.69 kg/m   Intake/Output Summary (Last 24 hours) at 02/14/2023 0940 Last data filed at 02/14/2023 0813 Gross per 24 hour  Intake 840 ml  Output 2500 ml  Net -1660 ml   Weight change:   Physical Exam: Gen: NAD, sitting up in bed CVS: RRR Resp: CTA B/L Abd: soft Ext: no edema Neuro: awake, alert Dialysis access: LUE AVF +b/t  Imaging: No results found.  Labs: BMET Recent Labs  Lab 02/09/23 0627 02/11/23 1207  NA 134* 130*  K 3.7 3.7  CL 96* 92*  CO2 28 29  GLUCOSE 180* 76  BUN 13 15  CREATININE 4.82* 5.77*  CALCIUM 9.7 9.8  PHOS 4.2  --    CBC Recent Labs  Lab 02/08/23 1730 02/09/23 0627 02/11/23 1207  WBC 10.7* 10.3 11.1*  NEUTROABS  --  6.1 7.7  HGB 8.7* 8.9* 8.7*  HCT 26.9*  27.2* 26.5*  MCV 92.4 91.6 92.0  PLT 180 162 173    Medications:     amLODipine  10 mg Oral QHS   bisacodyl  10 mg Rectal Once   busPIRone  15 mg Oral QHS   carvedilol  6.25 mg Oral BID WC   Chlorhexidine Gluconate Cloth  6 each Topical Daily   darbepoetin (ARANESP) injection - DIALYSIS  100 mcg Subcutaneous Q Fri-1800   diltiazem  60 mg Oral Q6H   escitalopram  20 mg Oral Daily   gabapentin  100 mg Oral TID   heparin injection (subcutaneous)  5,000 Units Subcutaneous Q8H   hydrALAZINE  100 mg Oral Q8H   insulin aspart  0-6 Units Subcutaneous TID WC   insulin aspart  1 Units Subcutaneous TID WC   insulin glargine-yfgn  12 Units Subcutaneous Daily   lanthanum  1,000 mg Oral TID with meals   levETIRAcetam  500 mg Oral BID   levothyroxine  175 mcg Oral q morning   lidocaine  1 patch Transdermal Q24H   multivitamin  1 tablet Oral QHS   pantoprazole  40 mg Oral Daily   senna-docusate  1 tablet Oral BID      Gean Quint, MD [facility] 02/14/2023, 9:40 AM

## 2023-02-14 NOTE — Progress Notes (Signed)
1 staple removed from scalp per order. Patient tolerated well.

## 2023-02-15 DIAGNOSIS — E46 Unspecified protein-calorie malnutrition: Secondary | ICD-10-CM | POA: Diagnosis present

## 2023-02-15 LAB — GLUCOSE, CAPILLARY
Glucose-Capillary: 187 mg/dL — ABNORMAL HIGH (ref 70–99)
Glucose-Capillary: 327 mg/dL — ABNORMAL HIGH (ref 70–99)
Glucose-Capillary: 337 mg/dL — ABNORMAL HIGH (ref 70–99)
Glucose-Capillary: 368 mg/dL — ABNORMAL HIGH (ref 70–99)
Glucose-Capillary: 72 mg/dL (ref 70–99)

## 2023-02-15 LAB — CBC
HCT: 30.1 % — ABNORMAL LOW (ref 36.0–46.0)
Hemoglobin: 9.4 g/dL — ABNORMAL LOW (ref 12.0–15.0)
MCH: 29.8 pg (ref 26.0–34.0)
MCHC: 31.2 g/dL (ref 30.0–36.0)
MCV: 95.6 fL (ref 80.0–100.0)
Platelets: 189 10*3/uL (ref 150–400)
RBC: 3.15 MIL/uL — ABNORMAL LOW (ref 3.87–5.11)
RDW: 17.1 % — ABNORMAL HIGH (ref 11.5–15.5)
WBC: 7.9 10*3/uL (ref 4.0–10.5)
nRBC: 0.3 % — ABNORMAL HIGH (ref 0.0–0.2)

## 2023-02-15 LAB — RENAL FUNCTION PANEL
Albumin: 3 g/dL — ABNORMAL LOW (ref 3.5–5.0)
Anion gap: 14 (ref 5–15)
BUN: 26 mg/dL — ABNORMAL HIGH (ref 6–20)
CO2: 21 mmol/L — ABNORMAL LOW (ref 22–32)
Calcium: 9.5 mg/dL (ref 8.9–10.3)
Chloride: 84 mmol/L — ABNORMAL LOW (ref 98–111)
Creatinine, Ser: 6.94 mg/dL — ABNORMAL HIGH (ref 0.44–1.00)
GFR, Estimated: 7 mL/min — ABNORMAL LOW (ref >60)
Glucose, Bld: 568 mg/dL (ref 70–99)
Phosphorus: 4.8 mg/dL — ABNORMAL HIGH (ref 2.5–4.6)
Potassium: 5.7 mmol/L — ABNORMAL HIGH (ref 3.5–5.1)
Sodium: 119 mmol/L — CL (ref 135–145)

## 2023-02-15 LAB — GLUCOSE, RANDOM: Glucose, Bld: 263 mg/dL — ABNORMAL HIGH (ref 70–99)

## 2023-02-15 MED ORDER — GLUCERNA SHAKE PO LIQD
237.0000 mL | Freq: Three times a day (TID) | ORAL | Status: DC
  Administered 2023-02-15 – 2023-02-16 (×2): 237 mL via ORAL

## 2023-02-15 MED ORDER — INSULIN GLARGINE-YFGN 100 UNIT/ML ~~LOC~~ SOLN
15.0000 [IU] | Freq: Every day | SUBCUTANEOUS | Status: DC
  Administered 2023-02-16: 15 [IU] via SUBCUTANEOUS
  Filled 2023-02-15 (×2): qty 0.15

## 2023-02-15 MED ORDER — INSULIN ASPART 100 UNIT/ML IJ SOLN: 5.0000 [IU] | Freq: Once | INTRAMUSCULAR | Status: DC

## 2023-02-15 MED ORDER — LOSARTAN POTASSIUM 25 MG PO TABS: 25.0000 mg | ORAL_TABLET | Freq: Every day | ORAL | Status: DC

## 2023-02-15 MED ORDER — METOCLOPRAMIDE HCL 5 MG PO TABS
5.0000 mg | ORAL_TABLET | Freq: Two times a day (BID) | ORAL | Status: DC
  Administered 2023-02-15 – 2023-02-16 (×2): 5 mg via ORAL
  Filled 2023-02-15 (×2): qty 1

## 2023-02-15 NOTE — Assessment & Plan Note (Addendum)
Last A1C 01/27/23 12%. Reviewed CBGs - generally running high with several episodes of hypoglycemia. Question of uneven calorie intake precipitating hypoglycemic episodes. Duplicate meal time sliding scale orders noted.  Plan Increase lantus to 15uQHS  Single sliding scale order for before meal dosing of novolog-sensitive scale for ESRD-HD patient  Diabetes coordinator consult  Dietician consult   Regular calorie supplement and bedtime snack  Record CBG readings  Close follow up PCP or endocrinologist  No qHS sliding scale

## 2023-02-15 NOTE — Progress Notes (Signed)
Received patient in bed to unit.  Alert and oriented.  Informed consent signed and in chart.   TX duration: 3.5  Patient tolerated well.  Transported back to the room  Alert, without acute distress.  Hand-off given to patient's nurse.   Access used: RIGHT AVG Access issues: NONE  Total UF removed: 2.5L Medication(s) given:     02/15/23 1353  Vitals  Temp 98.3 F (36.8 C)  Temp Source Oral  BP 136/74  MAP (mmHg) 92  BP Location Right Arm  BP Method Automatic  Patient Position (if appropriate) Lying  Pulse Rate 83  Pulse Rate Source Monitor  ECG Heart Rate 85  Resp 16  Oxygen Therapy  SpO2 99 %  O2 Device Room Air  During Treatment Monitoring  HD Safety Checks Performed Yes  Intra-Hemodialysis Comments Tx completed  Dialysis Fluid Bolus Normal Saline  Bolus Amount (mL) 300 mL  Fistula / Graft Left Upper arm  No placement date or time found.   Placed prior to admission: Yes  Orientation: Left  Access Location: Upper arm  Status Deaccessed;Flushed    Chyrel Taha [facility]
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
## 2023-02-15 NOTE — Progress Notes (Signed)
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 Code stroke on 2/24 d/t increased lethargy. Hypoglycemia episodes--per primary service. Neuro consulted -Brain MRI w/o acute changes.  ESRD: Continue HD on MWF schedule - no added heparin. Anemia of ESRD: Hgb up to 9.4, Aranesp started q Friday. 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 Phos improving, continue lanthanum as binder Acute hypoxic respiratory failure: Extubated. Resolved.  Hypertension: BP acceptable. On diltiazem, amlodipine, hydralazine. UF as tolerated Dispo: dc date plan moved to 2/28. Apparently patient's Mother is requesting her to be transferred to another HD clinic in [place]; sw following for this  Outpatient Dialysis Orders: MWF [facility] [address] 3.5hr, EDW 62kg, 2K/2.5Ca bath, LUE AVF, Prev was getting Hep 2600 + 500 u/hr - now held  Subjective:   Patient seen and examined on dialysis. No complaints, tolerating treatment well thus far.   Objective:   BP 128/70 (BP Location: Right Arm)   Pulse 78   Temp 98.4 F (36.9 C) (Oral)   Resp 13   Ht 5' 4.96" (1.65 m)   Wt 66.9 kg   LMP 02/07/2023 (Approximate)   SpO2 98%   BMI 24.57 kg/m   Intake/Output Summary (Last 24 hours) at 02/15/2023 [id number] Last data filed at 02/15/2023 0818 Gross per 24 hour  Intake 780 ml  Output --  Net 780 ml   Weight change: -2.7 kg  Physical Exam: Gen: NAD, laying flat in stretcher CVS: RRR Resp: CTA B/L Abd: soft Ext: no edema Neuro: awake, alert Dialysis access: LUE AVF in use  Imaging: DG Abd Portable 1V  Result Date: 02/14/2023 CLINICAL DATA:  Nausea EXAM: PORTABLE ABDOMEN - 1 VIEW COMPARISON:  Radiograph 02/01/2023, 01/27/2023 FINDINGS: No evidence of bowel obstruction. Moderate stool burden. Vascular calcifications. Pelvic phleboliths. No acute osseous abnormality. IMPRESSION: No evidence  of bowel obstruction.  Moderate stool burden. Electronically Signed   By: Maurine Simmering M.D.   On: 02/14/2023 10:11    Labs: BMET Recent Labs  Lab 02/09/23 0627 02/11/23 1207  NA 134* 130*  K 3.7 3.7  CL 96* 92*  CO2 28 29  GLUCOSE 180* 76  BUN 13 15  CREATININE 4.82* 5.77*  CALCIUM 9.7 9.8  PHOS 4.2  --    CBC Recent Labs  Lab 02/08/23 1730 02/09/23 0627 02/11/23 1207 02/15/23 0851  WBC 10.7* 10.3 11.1* 7.9  NEUTROABS  --  6.1 7.7  --   HGB 8.7* 8.9* 8.7* 9.4*  HCT 26.9* 27.2* 26.5* 30.1*  MCV 92.4 91.6 92.0 95.6  PLT 180 162 173 189    Medications:     amLODipine  10 mg Oral QHS   busPIRone  15 mg Oral QHS   carvedilol  6.25 mg Oral BID WC   Chlorhexidine Gluconate Cloth  6 each Topical Daily   darbepoetin (ARANESP) injection - DIALYSIS  100 mcg Subcutaneous Q Fri-1800   diltiazem  60 mg Oral Q6H   escitalopram  20 mg Oral Daily   gabapentin  100 mg Oral TID   heparin injection (subcutaneous)  5,000 Units Subcutaneous Q8H   hydrALAZINE  100 mg Oral Q8H   insulin aspart  0-6 Units Subcutaneous TID WC   insulin aspart  1 Units Subcutaneous TID WC   insulin glargine-yfgn  12 Units Subcutaneous Daily   lanthanum  1,000 mg Oral TID with meals   levETIRAcetam  500 mg Oral BID  levothyroxine  175 mcg Oral q morning   lidocaine  1 patch Transdermal Q24H   multivitamin  1 tablet Oral QHS   pantoprazole  40 mg Oral Daily   senna-docusate  1 tablet Oral BID      Gean Quint, MD [facility] 02/15/2023, 9:38 AM

## 2023-02-15 NOTE — Subjective & Objective (Signed)
Angela [name] is a 37 y.o. female with past medical history of ESRD on HD, DM, HTN, seizures, HLD, GERD, hypothyroidism, anxiety and depression who presents from dialysis center for evaluation of AMS. She had not been feeling well today, went to HD this morning and while at dialysis one hour into treatment she had a syncopal episode. On arrival to the ED BP 207/113, cleviprex drip was started after '30mg'$  IV labetalol. CT head with acute left ICH with IVH. CTA head and neck with no aneurysm or vascular malformation and no LVO. Patient was intubated for airway protection in the ED and propofol and fentanyl drips were started . Patient will be admitted to the neuro ICU for further management.   Hospital course per d/c summary:Ventriculostomy drain placed on 2/09. Required hypertonic saline. Hemoglobin down to 6.4 and given one unit of PRBCs. Heparin [place] started for VTE prophy. Cortrak placed. She has a history of seizure disorder on Keppra, uncontrolled DM-1 (A1c = 12.0). Nephrology consulted for HD orders. Repeat CTH on 2/12 with decreased size of ventricles. She was extubated on 2/12. Neuro status improved and EVD clamped on 2/15. Repeat CT head on 2/16 without hydrocephalus and significant clearing of IVH. EVD removed and NS signed off. Swallow evaluation on 2/16. Her BP was brought under control. Her A1C was 12%. She was treated with insulin and sliding scale. She was admitted to [facility] 02/04/23.  Since admission to CIR she has been able to participate in therapy.Glucose control has been a problem. Review of CBG's: trend is too higher readings but there have been several episodes of hypoglycemia. She has continued on HD followed by nephrology. She had a change in mental status and HA 02/11/23 and code stroke called. Neurology saw patient. CT head raised question of L PICA CVA but MRI brain negative for acute CVA/PICA. Today CBG was 568 along with metabolic derangement. Nephrolology is addressing metabolic issues and  she is getting HD today.

## 2023-02-15 NOTE — Progress Notes (Signed)
Spoke with lab tech about glucose draw. Pt needs to be outside of the 2 hour window after HD before labs can be drawn again, lab tech will come back around 5 pm

## 2023-02-15 NOTE — Progress Notes (Addendum)
Awaiting medical clearance for out-pt HD clinic in [place]. Update provided to CSW.   Melven Sartorius Renal Navigator [phone]  Addendum at 2:00 pm: CSW advised of nephrologist note for today and need for pt to be able to tolerate sitting in a recliner for a few hrs (can be done in room) prior to d/c and going to a new clinic.

## 2023-02-15 NOTE — Assessment & Plan Note (Signed)
Per neurology and CIR. Stable on exam w/o focal deficit or cognitive issues

## 2023-02-15 NOTE — Assessment & Plan Note (Addendum)
Reviewed blood pressure readings - generally above goal. Reviewed med list: on two CCB - amlodipine and diltiazem.  Plan  D/c amlodipine  Continue diltiazem at 60 qid -consider long-acting form at discharge  Continue Coreg  Continue losartan  Continue hydralazine  Close outpatient follow-up

## 2023-02-15 NOTE — Assessment & Plan Note (Signed)
Per nephrology 

## 2023-02-15 NOTE — Progress Notes (Signed)
PROGRESS NOTE   Subjective/Complaints:  Called from HD regarding CBG spike  Spoke to nephro regarding drop in Na+,elevated K+  ROS: Unable to obtain due to lethargy, cognitive status  Objective:   DG Abd Portable 1V  Result Date: 02/14/2023 CLINICAL DATA:  Nausea EXAM: PORTABLE ABDOMEN - 1 VIEW COMPARISON:  Radiograph 02/01/2023, 01/27/2023 FINDINGS: No evidence of bowel obstruction. Moderate stool burden. Vascular calcifications. Pelvic phleboliths. No acute osseous abnormality. IMPRESSION: No evidence of bowel obstruction.  Moderate stool burden. Electronically Signed   By: Maurine Simmering M.D.   On: 02/14/2023 10:11   Recent Labs    02/15/23 0851  WBC 7.9  HGB 9.4*  HCT 30.1*  PLT 189    Recent Labs    02/15/23 0717  NA 119*  K 5.7*  CL 84*  CO2 21*  GLUCOSE 568*  BUN 26*  CREATININE 6.94*  CALCIUM 9.5     Intake/Output Summary (Last 24 hours) at 02/15/2023 1033 Last data filed at 02/15/2023 0818 Gross per 24 hour  Intake 780 ml  Output --  Net 780 ml         Physical Exam: Vital Signs Blood pressure 129/70, pulse 83, temperature 98.4 F (36.9 C), temperature source Oral, resp. rate 18, height 5' 4.96" (1.65 m), weight 66.9 kg, last menstrual period 02/07/2023, SpO2 99 %.    General: No acute distress Mood and affect are appropriate Heart: Regular rate and rhythm no rubs murmurs or extra sounds. + Right JVD Lungs: Clear to auscultation, breathing unlabored, no rales or wheezes Abdomen: Positive bowel sounds, tender to palpation along left lower rib cage - unchanged, nondistended Extremities: No clubbing, cyanosis, or edema MSK:  RIght knee without abrasion or ecchymosis , no pain to palp or with ROM   Skin:LUE AVF Neuro: Oriented x4 with use of room cues.  All 4 extremities antigravity and against resistance, reacts to stimuli in all 4 extremities.  Does not participate in cranial nerve  testing.  Assessment/Plan: 1. Functional deficits which require 3+ hours per day of interdisciplinary therapy in a comprehensive inpatient rehab setting. Physiatrist is providing close team supervision and 24 hour management of active medical problems listed below. Physiatrist and rehab team continue to assess barriers to discharge/monitor patient progress toward functional and medical goals  Care Tool:  Bathing    Body parts bathed by patient: Right arm, Left arm, Chest, Abdomen, Front perineal area, Buttocks, Right upper leg, Left upper leg, Right lower leg, Left lower leg, Face         Bathing assist Assist Level: Set up assist     Upper Body Dressing/Undressing Upper body dressing   What is the patient wearing?: Pull over shirt    Upper body assist Assist Level: Set up assist    Lower Body Dressing/Undressing Lower body dressing      What is the patient wearing?: Underwear/pull up, Pants     Lower body assist Assist for lower body dressing: Set up assist     Toileting Toileting Toileting Activity did not occur (Clothing management and hygiene only): N/A (no void or bm)  Toileting assist Assist for toileting: Set up assist  Transfers Chair/bed transfer  Transfers assist     Chair/bed transfer assist level: Supervision/Verbal cueing     Locomotion Ambulation   Ambulation assist      Assist level: Supervision/Verbal cueing Assistive device: Other (comment) (at gait belt and L shoulder) Max distance: 131f   Walk 10 feet activity   Assist     Assist level: Supervision/Verbal cueing Assistive device: No Device   Walk 50 feet activity   Assist    Assist level: Supervision/Verbal cueing Assistive device: No Device    Walk 150 feet activity   Assist    Assist level: Supervision/Verbal cueing Assistive device: No Device    Walk 10 feet on uneven surface  activity   Assist     Assist level: Supervision/Verbal  cueing Assistive device: Other (comment) (at gait belt and L shoulder and does reach for external support if available.)   Wheelchair     Assist Is the patient using a wheelchair?: Yes Type of Wheelchair: Manual    Wheelchair assist level: Independent Max wheelchair distance: 1560f   Wheelchair 50 feet with 2 turns activity    Assist    Wheelchair 50 feet with 2 turns activity did not occur:  (per PT documentation)   Assist Level: Independent   Wheelchair 150 feet activity     Assist      Assist Level: Independent   Blood pressure 129/70, pulse 83, temperature 98.4 F (36.9 C), temperature source Oral, resp. rate 18, height 5' 4.96" (1.65 m), weight 66.9 kg, last menstrual period 02/07/2023, SpO2 99 %.  Medical Problem List and Plan: 1. Functional deficits [place] to [place] with resolving IVH - uncontrolled              -patient may shower             -ELOS/Goals: 02/16/23 S              delay d/c until am to allow monitoring, ask IM to consult on DM management but will ultimately need to see endo post d/c in a timely basis 2.  Antithrombotics: -DVT/anticoagulation:  Pharmaceutical: Heparin             -antiplatelet therapy: none   3. Pain Management: Tylenol as needed Intermittent HA, expected post IVH , no hydrocephalus on repeat scan    4. Mood/Behavior/Sleep: LCSW to evaluate and provide emotional support             -depression/anxiety: continue Buspar and Lexapro             -antipsychotic agents: n/a   5. Neuropsych/cognition: This patient not capable of making decisions on her own behalf.   Am lethargy but does arouse to physical stim,   -  6. Skin/Wound Care: Routine skin care checks   7. Fluids/Electrolytes/Nutrition: strict Is and Os; chemistries as per nephrology             -TF d/c ed             -continue Fosrenol, MVI, Aranesp   8: Hypertension: monitor TID and prn (home Lasix, losartan held>>other home meds restarted              -continue amlodipine 10 mg daily             -diltiazem 60 mg TID             -hydralazine 100 mg TID     Vitals:   02/15/23 [id number]/28/24 [id number]  BP: 128/70 129/70  Pulse: 78 83  Resp: 13 18  Temp:    SpO2: 98% 99%  Labile pulse mildly elevated and BP still up will start low dose coreg, may titrate this up and reduce diltiazem - 2/24: Pulse consistently in 80s to 90s, increase Coreg for blood pressure control with improvements this afternoon - BP much imporved  9: Hyperlipidemia: home statin held   10: Hydrocephalus: resolved; EVD placed 2/09, removed 2/16 -CT head, MRI stable 2/24   11: ESRD: HD on M/W/F via LUE AVF   12: DM-1; uncontolled: continue CBGs q 4 hours. Sensitive sliding scale changed to very sensitive given CBG of 32 on admission -decrease Semglee to 12 units daily -> 8U daily due to persistent hypoglycemia and recurrent hypoglycemic episodes -> 10U d/t hyperglycemia 2/25 -reduce novalog to  1U TID novolog with meals as per diabetic coordinator recommendations. Hold if po intake <50%   CBG (last 3)  Recent Labs    02/14/23 2009 02/14/23 2334 02/15/23 0424  GLUCAP 147* 243* 368*   Semglee down to 10U elevated am CBG , increase to 12U, rec endo f/u , has insulin pump but did not have it calibrated yet due to CVA - mom needs to learn insulin adminstration    13: Seizure disorder: home dose Keppra resumed             continue Keppra 500 mg BID             -gabapentin 100 mg TID-unable to increase due to creatinine clearance  may be contributing to drowsiness  -will hold off on medication changes that may reduce seizure threshold at this time  14: Hypothyroidism: continue Synthroid   15: Anemia, chronic disease: s/p 1 unit PCs, follow-up CBC  16.  Nausea , constipation check KUB prior to discharge , if showing stool in left colon will give dulc supp   LOS: 11 days A FACE TO FACE EVALUATION WAS PERFORMED  Angela [name] 02/15/2023, 10:33 AM

## 2023-02-15 NOTE — Progress Notes (Signed)
Received call from HD unit in regards to critical results. Na 119, Gluc 568. Corrected Na around 126. Is already on HD, managing with HD/Uf'ing as tolerated. Dialyzing in bed, will need to be able to sit in recliner before considering outpatient HD placement. Discussed with primary service. Please let me know if she tolerating sitting in a recliner for a few fours (can be done in room). Gean Quint, MD [facility]

## 2023-02-15 NOTE — Assessment & Plan Note (Signed)
Reviewed lab: albumin 3.0, total protein 6. Gastroparesis may interfere with calorie/protein intake.   Plan Glucerna supplement BID  Bedtime snack  Dietician consult

## 2023-02-15 NOTE — Progress Notes (Signed)
HD nurse called and requested that this nurse send 5 units of novolog. Nurse stated that lab draw was done and nurse had results called for 568 CBG. This nurse sent 5 units plus scheduled insulin that was missed this Am due to transport time to other unit.

## 2023-02-15 NOTE — Progress Notes (Signed)
Hospitalist service, Dr. Linda Hedges notified of request for consultation for diabetes management.

## 2023-02-15 NOTE — Progress Notes (Addendum)
Patient ID: Angela [name], female   DOB: 03-23-1986, 37 y.o.   MRN: [id number]  Team Conference Report to Patient/Family  Team Conference discussion was reviewed with the patient and caregiver, including goals, any changes in plan of care and target discharge date.  Patient and caregiver express understanding and are in agreement.  The patient has a target discharge date of  (D/C pending medical clearance).  SW spoke with pt's mother to discuss medical hold due to [facility] issues.  Mother frustrated due to being unaware. Sw informed that patient must be able to sit in recliner before considering outpatient HD placement. Please let me know if she tolerating sitting in a recliner for a few hours prior to d/c and going to a new clinic  (can be done in room).   Dyanne Iha 02/16/2023, 9:28 AM

## 2023-02-15 NOTE — Assessment & Plan Note (Signed)
Patient reports frequent nausea and slow gastric emptying symptoms. Currently on no medication.  Plan Reglan 5 mg bid

## 2023-02-16 ENCOUNTER — Other Ambulatory Visit (HOSPITAL_COMMUNITY): Payer: Self-pay

## 2023-02-16 LAB — RENAL FUNCTION PANEL
Albumin: 3 g/dL — ABNORMAL LOW (ref 3.5–5.0)
Anion gap: 12 (ref 5–15)
BUN: 21 mg/dL — ABNORMAL HIGH (ref 6–20)
CO2: 26 mmol/L (ref 22–32)
Calcium: 10 mg/dL (ref 8.9–10.3)
Chloride: 93 mmol/L — ABNORMAL LOW (ref 98–111)
Creatinine, Ser: 5.3 mg/dL — ABNORMAL HIGH (ref 0.44–1.00)
GFR, Estimated: 10 mL/min — ABNORMAL LOW (ref >60)
Glucose, Bld: 98 mg/dL (ref 70–99)
Phosphorus: 4.6 mg/dL (ref 2.5–4.6)
Potassium: 4.4 mmol/L (ref 3.5–5.1)
Sodium: 131 mmol/L — ABNORMAL LOW (ref 135–145)

## 2023-02-16 LAB — GLUCOSE, CAPILLARY
Glucose-Capillary: 121 mg/dL — ABNORMAL HIGH (ref 70–99)
Glucose-Capillary: 195 mg/dL — ABNORMAL HIGH (ref 70–99)
Glucose-Capillary: 302 mg/dL — ABNORMAL HIGH (ref 70–99)
Glucose-Capillary: 295 mg/dL — ABNORMAL HIGH (ref 70–99)

## 2023-02-16 MED ORDER — INSULIN GLARGINE-YFGN 100 UNIT/ML ~~LOC~~ SOLN: 6.0000 [IU] | Freq: Two times a day (BID) | SUBCUTANEOUS | Status: DC

## 2023-02-16 MED ORDER — METOCLOPRAMIDE HCL 5 MG PO TABS
5.0000 mg | ORAL_TABLET | Freq: Two times a day (BID) | ORAL | 0 refills | Status: DC
  Filled 2023-02-16: qty 60, 30d supply, fill #0

## 2023-02-16 MED ORDER — INSULIN GLARGINE-YFGN 100 UNIT/ML ~~LOC~~ SOLN: 6.0000 [IU] | Freq: Two times a day (BID) | SUBCUTANEOUS | 11 refills | Status: DC

## 2023-02-16 MED ORDER — INSULIN GLARGINE-YFGN 100 UNIT/ML ~~LOC~~ SOLN: 15.0000 [IU] | Freq: Every day | SUBCUTANEOUS | 11 refills | Status: DC

## 2023-02-16 NOTE — Patient Care Conference (Signed)
Inpatient RehabilitationTeam Conference and Plan of Care Update Date: 02/15/2023   Time: 10:30 AM    Patient Name: Angela [name]      Medical Record Number: [id number]  Date of Birth: [birth year] Sex: Female         Room/Bed: 4W22C/4W22C-01 Payor Info: Payor: MEDICAID  / Plan: MEDICAID [place] ACCESS / Product Type: *No Product type* /    Admit Date/Time:  02/04/2023  3:40 PM  Primary Diagnosis:  ICH (intracerebral hemorrhage) [facility])  Hospital Problems: Principal Problem:   ICH (intracerebral hemorrhage) ([place]) Active Problems:   Uncontrolled type 1 diabetes mellitus with hypoglycemia, with long-term current use of insulin ([place])   HTN (hypertension)   Diabetic gastroparesis associated with type 1 diabetes mellitus ([place])   ESRD on dialysis ([place])   Protein calorie malnutrition [facility])    Expected Discharge Date: Expected Discharge Date:  (D/C pending medical clearance)  Team Members Present: Physician leading conference: Dr. Alysia Penna Social Worker Present: Erlene Quan, BSW Nurse Present: Dorien Chihuahua, RN PT Present: Alden Hipp, PT OT Present: Meriel Pica, OT SLP Present: Sherren Kerns, SLP PPS Coordinator present : Gunnar Fusi, SLP     Current Status/Progress Goal Weekly Team Focus  Bowel/Bladder   continent of b/b   remain continent   Assist with toileting as needed    Swallow/Nutrition/ Hydration   regular, thin liquids - min A   sup A  tolerance of current diet with implementation of current diet    ADL's   pt is mod I with basic self care skills of toileting, showering, dressing,  but needs S for ambulating longer distances   Mod I   family education completed    Mobility   overall supervision when awake and focused, otherwise will require CGA for safety   Independent w/ bed mobiltiy, Mod I w/ transfers and gait  pt now scheduled for d/c Thu 2/29; grad day completed mon 2/26 with pt meeting 12/13 goals with overall supervision     Communication                Safety/Cognition/ Behavioral Observations  mod-to-max A - limited by lethargy   mod-to-max A (goals downgraded due to significant lethargy)   intellectual awareness, attention, memory with use of compensations, problem solving, education    Pain   C/o HA, PRN medication given   Pain <3/10   Assess Qshift and prn    Skin   LUE fistula, Abdominal bruising, remainder of skin intact   Maintain skin integrity  Assess Qshift and prn      Discharge Planning:  Discharging home today/tomorrow based on HD location status   Team Discussion: Patient scheduled for discharge however medical issues developed, HD pending and OP HD has not been confirmed post discharge.   Patient on target to meet rehab goals: yes, patient is supervision overall and has met her PT/OT goals however the patient did not meet cognition goals. Currently need mod - max assist for cognitive tasks.   *See Care Plan and progress notes for long and short-term goals.   Revisions to Treatment Plan:  Hospitalist consulted to cover until she can follow up with her endocrinologist   Teaching Needs: Safety, medications, insulin administration/ CBG monitoring and hyper/hypoglycemia management, dietary modifications, transfers, toileting, etc.   Current Barriers to Discharge: Decreased caregiver support and Hemodialysis  Possible Resolutions to Barriers: Family education HH follow up DME: TTB     Medical Summary  I attest that I was present, lead the team conference, and concur with the assessment and plan of the team.   Margarito Liner 02/16/2023, 8:44 AM

## 2023-02-16 NOTE — Progress Notes (Signed)
CBG 295 at 12:03. No coverage given. Patient stable for DC home with family.

## 2023-02-16 NOTE — Progress Notes (Signed)
Inpatient Rehabilitation Discharge Medication Review by a Pharmacist  A complete drug regimen review was completed for this patient to identify any potential clinically significant medication issues.  High Risk Drug Classes Is patient taking? Indication by Medication  Antipsychotic No   Anticoagulant No   Antibiotic No   Opioid No   Antiplatelet No   Hypoglycemics/insulin Yes Insulin- T2DM  Vasoactive Medication Yes Coreg, norvasc, diltiazem, hydralazine- HTN  Chemotherapy No   Other Yes Albuterol- asthma Buspar, lexapro- anxiety Aranesp- anemia 2/2 ESRD Gabapentin- neuropathic pain Fosrenol- hyperphosphatemia 2/2 ESRD Keppra- seizure ppx Synthroid- hypothyroidism Protonix- GERD     Type of Medication Issue Identified Description of Issue Recommendation(s)  Drug Interaction(s) (clinically significant)     Duplicate Therapy     Allergy     No Medication Administration End Date     Incorrect Dose     Additional Drug Therapy Needed     Significant med changes from prior encounter (inform family/care partners about these prior to discharge).    Other       Clinically significant medication issues were identified that warrant physician communication and completion of prescribed/recommended actions by midnight of the next day:  No  Name of provider notified for urgent issues identified:   Provider Method of Notification:     Pharmacist comments:   Time spent performing this drug regimen review (minutes):  30   Shiah Berhow BS, PharmD, BCPS Clinical Pharmacist 02/16/2023 10:06 AM  Contact: [phone] after 3 PM  "Be curious, not judgmental..." -Jamal Maes

## 2023-02-16 NOTE — Progress Notes (Signed)
Patient ID: Angela [name], female   DOB: Aug 06, 1986, 37 y.o.   MRN: [id number]  pt has been accepted at [facility] on MWF 12:15 chair time.

## 2023-02-16 NOTE — Progress Notes (Signed)
Pt has been accepted to [facility] on MWF 12:15 chair time. Pt can start tomorrow and will need to arrive at 11:30 to complete paperwork prior to treatment. Spoke to pt's mother via phone to provide above details. Pt's mother agreeable to plan. Update provided to [place] and pt's RN. Case discussed with nephrologist as well who feels pt is appropriate for out-pt HD. Contacted renal NP regarding clinic's need for orders. Arrangements added to AVS as well. Clinic aware of pt's d/c today and that pt will start tomorrow.   Melven Sartorius Renal Navigator [phone]

## 2023-02-16 NOTE — Progress Notes (Signed)
1200 CBG checked "79". Patient lethargic and decrease in energy. Did endorse earlier feeling tired with no appetite. With multiple prompts able to wake to take scheduled medication at 1200. At that time mom assisted her daughter in finishing scheduled Glucerna from earlier and peanut butter/jelly sandwich. Coming back to change HD Cath dressings mom giving patient multiple packs of sugar. Educated about this not being appropriate due to what patient currently just consumed. Mom explained that does this often when her daughter sugar is low. Mom is concerned about her CBG being low expressed that in the past the lowest her CBG level was "29". Charge RN made aware of situation. Mom also asking question if daughter is on fluid restriction.

## 2023-02-16 NOTE — Progress Notes (Addendum)
PROGRESS NOTE   Subjective/Complaints:  Pt feels well this am  Discussed with nephro, labs and CBGs improved Appreciate hospitalist consult  Pt has been sitting up in recliner and tolerating this well throughout her rehab stay   Discussed no driving- expect this to be permanent restriction   ROS- denies pain , breathing issues, dizziness or bowel issues  Objective:   DG Abd Portable 1V  Result Date: 02/14/2023 CLINICAL DATA:  Nausea EXAM: PORTABLE ABDOMEN - 1 VIEW COMPARISON:  Radiograph 02/01/2023, 01/27/2023 FINDINGS: No evidence of bowel obstruction. Moderate stool burden. Vascular calcifications. Pelvic phleboliths. No acute osseous abnormality. IMPRESSION: No evidence of bowel obstruction.  Moderate stool burden. Electronically Signed   By: Maurine Simmering M.D.   On: 02/14/2023 10:11   Recent Labs    02/15/23 0851  WBC 7.9  HGB 9.4*  HCT 30.1*  PLT 189   Recent Labs    02/15/23 0717 02/15/23 1724 02/16/23 0700  NA 119*  --  131*  K 5.7*  --  4.4  CL 84*  --  93*  CO2 21*  --  26  GLUCOSE 568* 263* 98  BUN 26*  --  21*  CREATININE 6.94*  --  5.30*  CALCIUM 9.5  --  10.0    Intake/Output Summary (Last 24 hours) at 02/16/2023 0930 Last data filed at 02/16/2023 [id number] Gross per 24 hour  Intake 445 ml  Output 2500 ml  Net -2055 ml        Physical Exam: Vital Signs Blood pressure (!) 154/91, pulse 81, temperature 98.9 F (37.2 C), resp. rate 18, height 5' 4.96" (1.65 m), weight 68.2 kg, last menstrual period 02/07/2023, SpO2 98 %.    General: No acute distress Mood and affect are appropriate Heart: Regular rate and rhythm no rubs murmurs or extra sounds.  Lungs: Clear to auscultation, breathing unlabored, no rales or wheezes Abdomen: Positive bowel sounds,  nondistended Extremities: No clubbing, cyanosis, or edema MSK:  RIght knee without abrasion or ecchymosis , no pain to palp or with ROM   Skin:LUE  AVF Neuro: Alert Oriented  with use of room cues.  All 4 extremities antigravity and against resistance, reacts to stimuli in all 4 extremities.  Does not participate in cranial nerve testing.  Assessment/Plan: 1. Functional deficits due to IVH Stable for D/C today F/u PCP in 3-4 weeks F/u endo 1-2 weeks F/u Neuro 1-2 mo F/u PM&R 2 weeks See D/C summary See D/C instructions   Care Tool:  Bathing    Body parts bathed by patient: Right arm, Left arm, Chest, Abdomen, Front perineal area, Buttocks, Right upper leg, Left upper leg, Right lower leg, Left lower leg, Face         Bathing assist Assist Level: Set up assist     Upper Body Dressing/Undressing Upper body dressing   What is the patient wearing?: Pull over shirt    Upper body assist Assist Level: Set up assist    Lower Body Dressing/Undressing Lower body dressing      What is the patient wearing?: Underwear/pull up, Pants     Lower body assist Assist for lower body dressing: Set up assist  Toileting Toileting Toileting Activity did not occur Landscape architect and hygiene only): N/A (no void or bm)  Toileting assist Assist for toileting: Set up assist     Transfers Chair/bed transfer  Transfers assist     Chair/bed transfer assist level: Supervision/Verbal cueing     Locomotion Ambulation   Ambulation assist      Assist level: Supervision/Verbal cueing Assistive device: Other (comment) (at gait belt and L shoulder) Max distance: 168f   Walk 10 feet activity   Assist     Assist level: Supervision/Verbal cueing Assistive device: No Device   Walk 50 feet activity   Assist    Assist level: Supervision/Verbal cueing Assistive device: No Device    Walk 150 feet activity   Assist    Assist level: Supervision/Verbal cueing Assistive device: No Device    Walk 10 feet on uneven surface  activity   Assist     Assist level: Supervision/Verbal cueing Assistive device:  Other (comment) (at gait belt and L shoulder and does reach for external support if available.)   Wheelchair     Assist Is the patient using a wheelchair?: Yes Type of Wheelchair: Manual    Wheelchair assist level: Independent Max wheelchair distance: 1562f   Wheelchair 50 feet with 2 turns activity    Assist    Wheelchair 50 feet with 2 turns activity did not occur:  (per PT documentation)   Assist Level: Independent   Wheelchair 150 feet activity     Assist      Assist Level: Independent   Blood pressure (!) 154/91, pulse 81, temperature 98.9 F (37.2 C), resp. rate 18, height 5' 4.96" (1.65 m), weight 68.2 kg, last menstrual period 02/07/2023, SpO2 98 %.  Medical Problem List and Plan: 1. Functional deficits secondary to [place] caudate with resolving IVH - uncontrolled              -patient may shower             -ELOS/Goals: 02/16/23 S             Stable for d/c to home today with nephro , PCP, neuro, endo   and PMR f/u appts 2.  Antithrombotics: -DVT/anticoagulation:  Pharmaceutical: Heparin             -antiplatelet therapy: none   3. Pain Management: Tylenol as needed Intermittent HA, expected post IVH ,improved    4. Mood/Behavior/Sleep: LCSW to evaluate and provide emotional support             -depression/anxiety: continue Buspar and Lexapro             -antipsychotic agents: n/a   5. Neuropsych/cognition: This patient not capable of making decisions on her own behalf.   Am lethargy but does arouse to physical stim,   -  6. Skin/Wound Care: Routine skin care checks   7. Fluids/Electrolytes/Nutrition: strict Is and Os; chemistries as per nephrology             -TF d/c ed             -continue Fosrenol, MVI, Aranesp   8: Hypertension: monitor TID and prn (home Lasix, losartan held>>other home meds restarted             -continue amlodipine 10 mg daily             -diltiazem 60 mg TID             -hydralazine 100 mg TID  Vitals:    02/16/23 0359 02/16/23 0654  BP: 135/82 (!) 154/91  Pulse: 81   Resp: 18   Temp: 98.9 F (37.2 C)   SpO2: 98%   BP up today , amlodipine stopped by IM  9: Hyperlipidemia: home statin held   10: Hydrocephalus: resolved; EVD placed 2/09, removed 2/16 -CT head, MRI stable 2/24   11: ESRD: HD on M/W/F via LUE AVF  able to sit in recliner for OP HD sessions 12: DM-1; uncontolled: continue CBGs q 4 hours. Sensitive sliding scale changed to very sensitive given CBG of 32 on admission -decrease Semglee to 12 units daily -> 8U daily due to persistent hypoglycemia and recurrent hypoglycemic episodes -> 10U d/t hyperglycemia 2/25 -reduce novalog to  1U TID novolog with meals as per diabetic coordinator recommendations. Hold if po intake <50%   CBG (last 3)  Recent Labs    02/16/23 0358 02/16/23 0752 02/16/23 0912  GLUCAP 121* 195* 302*  Still labile will need endo f/u to get insulin pump calibrated, mom educated    13: Seizure disorder: home dose Keppra resumed             continue Keppra 500 mg BID             -gabapentin 100 mg TID-unable to increase due to creatinine clearance  may be contributing to drowsiness  -will hold off on medication changes that may reduce seizure threshold at this time  14: Hypothyroidism: continue Synthroid   15: Anemia, chronic disease: s/p 1 unit PCs, follow-up CBC  16.  Nausea , constipation check KUB prior to discharge , if showing stool in left colon will give dulc supp   LOS: 12 days A FACE TO FACE EVALUATION WAS PERFORMED  Charlett Blake 02/16/2023, 9:30 AM

## 2023-02-16 NOTE — Progress Notes (Signed)
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 Code stroke on 2/24 d/t increased lethargy. Hypoglycemia episodes--per primary service. Neuro consulted -Brain MRI w/o acute changes.  ESRD: Continue HD on MWF schedule - no added heparin. Anemia of ESRD: Hgb up to 9.4, Aranesp q Friday. 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 Phos improving, continue lanthanum as binder Acute hypoxic respiratory failure: Extubated. Resolved.  Hypertension: BP acceptable. On diltiazem, amlodipine, hydralazine. UF as tolerated Uncontrolled DM1- per primary service, hospitalist consulted Dispo: dc date plan moved to 2/28. Apparently patient's Mother is requesting her to be transferred to another HD clinic in [place]; sw following for this  Outpatient Dialysis Orders: MWF [facility] [address] 3.5hr, EDW 62kg, 2K/2.5Ca bath, LUE AVF, Prev was getting Hep 2600 + 500 u/hr - now held  Subjective:   Patient seen and examined bedside. Mom at bedside. Tolerated HD yesterday. Net uf 2.5L. has been ambulatory in room. Na 131 today Patient resting comfortably today   Objective:   BP (!) 154/91   Pulse 81   Temp 98.9 F (37.2 C)   Resp 18   Ht 5' 4.96" (1.65 m)   Wt 68.2 kg   LMP 02/07/2023 (Approximate)   SpO2 98%   BMI 25.05 kg/m   Intake/Output Summary (Last 24 hours) at 02/16/2023 1049 Last data filed at 02/16/2023 [id number] Gross per 24 hour  Intake 445 ml  Output 2500 ml  Net -2055 ml   Weight change: 0.3 kg  Physical Exam: Gen: NAD, laying flat in bed CVS: RRR Resp: CTA B/L Abd: soft Ext: no edema Neuro: awake, alert Dialysis access: LUE AVF in use  Imaging: No results found.  Labs: BMET Recent Labs  Lab 02/11/23 1207 02/15/23 0717 02/15/23 1724 02/16/23 0700  NA 130* 119*  --  131*  K 3.7 5.7*  --  4.4  CL 92* 84*  --  93*  CO2 29 21*  --  26  GLUCOSE  76 568* 263* 98  BUN 15 26*  --  21*  CREATININE 5.77* 6.94*  --  5.30*  CALCIUM 9.8 9.5  --  10.0  PHOS  --  4.8*  --  4.6   CBC Recent Labs  Lab 02/11/23 1207 02/15/23 0851  WBC 11.1* 7.9  NEUTROABS 7.7  --   HGB 8.7* 9.4*  HCT 26.5* 30.1*  MCV 92.0 95.6  PLT 173 189    Medications:     busPIRone  15 mg Oral QHS   carvedilol  6.25 mg Oral BID WC   Chlorhexidine Gluconate Cloth  6 each Topical Daily   darbepoetin (ARANESP) injection - DIALYSIS  100 mcg Subcutaneous Q Fri-1800   diltiazem  60 mg Oral Q6H   escitalopram  20 mg Oral Daily   feeding supplement (GLUCERNA SHAKE)  237 mL Oral TID BM   gabapentin  100 mg Oral TID   heparin injection (subcutaneous)  5,000 Units Subcutaneous Q8H   hydrALAZINE  100 mg Oral Q8H   insulin aspart  0-6 Units Subcutaneous TID WC   insulin aspart  5 Units Subcutaneous Once   insulin glargine-yfgn  15 Units Subcutaneous Daily   lanthanum  1,000 mg Oral TID with meals   levETIRAcetam  500 mg Oral BID   levothyroxine  175 mcg Oral q morning   lidocaine  1 patch Transdermal Q24H   metoCLOPramide  5 mg Oral BID WC  multivitamin  1 tablet Oral QHS   pantoprazole  40 mg Oral Daily   senna-docusate  1 tablet Oral BID      Gean Quint, MD [facility] 02/16/2023, 10:49 AM

## 2023-02-16 NOTE — Inpatient Diabetes Management (Addendum)
Inpatient Diabetes Program Recommendations  AACE/ADA: New Consensus Statement on Inpatient Glycemic Control (2015)  Target Ranges:  Prepandial:   less than 140 mg/dL      Peak postprandial:   less than 180 mg/dL (1-2 hours)      Critically ill patients:  140 - 180 mg/dL   Lab Results  Component Value Date   GLUCAP 302 (H) 02/16/2023   HGBA1C 12.0 (H) 01/28/2023    Review of Glycemic Control  Latest Reference Range & Units 02/15/23 15:06 02/15/23 20:48 02/15/23 23:58 02/16/23 03:58 02/16/23 07:52 02/16/23 09:12  Glucose-Capillary 70 - 99 mg/dL 327 (H) 187 (H) 72 121 (H) 195 (H) 302 (H)  (H): Data is abnormally high  Diabetes history: Type 1 DM Outpatient Diabetes medications: Novolog 2-8 units tid with meals,Tresiba 12 units daily Current orders for Inpatient glycemic control: Novolog 0-6 units TID, Semglee 15 units QHS  Inpatient Diabetes Program Recommendations:    Noted insulin changes. However, with increasing Semglee, concern for hypoglycemia especially given progress note from this AM with patient consuming multiple packs of sugar with glucose of 72 mg/dL.   Instead, consider: -Semglee 6 units BID - Consider adding meal coverage once basal titrated to correct dose Secure chat sent to MD & PA  Spoke with patient's mother regarding outpatient plan for insulin following discharge.  Reviewed current glucose trends while inpatient, current dosing and multiple episodes of hypoglycemia.  Mother is in agreement with plan. Verified that patient did in fact have a significant low yesterday. Is savvy in managing patient's diabetes and able to verbalize intervention appropriately. Discussed need for endocrinology appointment. Mother has already called Dr gorrell's to establish and waiting for appointment to be moved to first available for insulin pump adjustments.  Of note, patient did received Novolog 4 units and Semglee 15 units this AM. Anticipate patient to be at high risk of  hypoglycemia this afternoon related to dosing. RN informed. Patient's mother also informed of risk for hypoglycemia and began encouraging patient to consume Ensure (41 g of CHO) in addition to lunch.  Reviewed outpatient plan with patient and education provided on the need for additional insulin adjustments to keep patient safe.    Thanks, Bronson Curb, MSN, RNC-OB Diabetes Coordinator [phone] (8a-5p)

## 2023-02-17 ENCOUNTER — Other Ambulatory Visit: Payer: Self-pay | Admitting: Internal Medicine

## 2023-02-17 ENCOUNTER — Telehealth: Payer: Self-pay

## 2023-02-17 NOTE — Telephone Encounter (Signed)
Transitional Care call--who you talked with    Are you/is patient experiencing any problems since coming home?  NO Are there any questions regarding any aspect of care? NO Are there any questions regarding medications administration/dosing? NO Are meds being taken as prescribed? YES Patient should review meds with caller to confirm Have there been any falls? NO Has Home Health been to the house and/or have they contacted you? Patient's mother had an issue with them and discharged. If not, have you tried to contact them? N/a Can we help you contact them? N/a Are bowels and bladder emptying properly? Yes added stool softener Are there any unexpected incontinence issues? NO  If applicable, is patient following bowel/bladder programs? NONE Any fevers, problems with breathing, unexpected pain? NO Are there any skin problems or new areas of breakdown? NO Has the patient/family member arranged specialty MD follow up (ie cardiology/neurology/renal/surgical/etc)? PCP 3/19 NEURO 4/2 Patient already established with endocrinology. They are trying to get appt moved up. Can we help arrange? Does the patient need any other services or support that we can help arrange? N/a Are caregivers following through as expected in assisting the patient? YES Has the patient quit smoking, drinking alcohol, or using drugs as recommended? YES  Appointment time 1:40 arrive time 1:20 and who it is with here Angela [name] [place]

## 2023-02-17 NOTE — Telephone Encounter (Signed)
Medication Refill - Medication   Lanthanum 1000 mg  chewable  Has the patient contacted their pharmacy? Yes.   (Agent: If no, request that the patient contact the pharmacy for the refill. If patient does not wish to contact the pharmacy document the reason why and proceed with request.) (Agent: If yes, when and what did the pharmacy advise?)  Preferred Pharmacy (with phone number or street name): [facility] Has the patient been seen for an appointment in the last year OR does the patient have an upcoming appointment? yes  Agent: Please be advised that RX refills may take up to 3 business days. We ask that you follow-up with your pharmacy.

## 2023-02-20 ENCOUNTER — Telehealth: Payer: Self-pay

## 2023-02-20 NOTE — Transitions of Care (Post Inpatient/ED Visit) (Signed)
   02/20/2023  Name: Angela [name] MRN: [id number] DOB: 1986-10-31  Today's TOC FU Call Status: Today's TOC FU Call Status:: Unsuccessul Call (1st Attempt) Unsuccessful Call (1st Attempt) Date: 02/20/23  Attempted to reach the patient regarding the most recent Inpatient/ED visit.voicemail full: [phone]   Follow Up Plan: Additional outreach attempts will be made to reach the patient to complete the Transitions of Care (Post Inpatient/ED visit) call.   Signature Eden Lathe, RN

## 2023-02-20 NOTE — Telephone Encounter (Signed)
Last RF 02/14/23 #90 too soon  Requested Prescriptions  Refused Prescriptions Disp Refills   lanthanum (FOSRENOL) 1000 MG chewable tablet 90 tablet 0    Sig: Chew 1 tablet (1,000 mg total) by mouth with breakfast, with lunch, and with evening meal.     Endocrinology: Phosphate Binders - lanthanum Failed - 02/17/2023  3:59 PM      Failed - Valid encounter within last 12 months    Recent Outpatient Visits           3 months ago Multiple thyroid nodules   [place] [name], MD   1 year ago Hospital discharge follow-up   [place], [place]   1 year ago Bronchitis with asthma, acute   [place] [name], MD   1 year ago [place], [place], [place]   2 years ago Other insomnia   [place], MD       Future Appointments             In 2 weeks [name], MD [place]   In 4 weeks [name], MD [facility]            Passed - Ca in normal range and within 360 days    Calcium  Date Value Ref Range Status  02/16/2023 10.0 8.9 - 10.3 mg/dL Final   Calcium, Ion  Date Value Ref Range Status  01/27/2023 1.23 1.15 - 1.40 mmol/L Final         Passed - Phosphate in normal range and within 360 days    Phosphorus  Date Value Ref Range Status  02/16/2023 4.6 2.5 - 4.6 mg/dL Final
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
## 2023-02-21 ENCOUNTER — Ambulatory Visit: Payer: Medicaid Other | Admitting: Occupational Therapy

## 2023-02-21 ENCOUNTER — Encounter: Payer: Self-pay | Admitting: Speech Pathology

## 2023-02-21 ENCOUNTER — Ambulatory Visit: Payer: Medicaid Other | Admitting: Physical Therapy

## 2023-02-21 ENCOUNTER — Encounter: Payer: Self-pay | Admitting: Occupational Therapy

## 2023-02-21 ENCOUNTER — Telehealth: Payer: Self-pay

## 2023-02-21 ENCOUNTER — Ambulatory Visit: Payer: Medicaid Other | Attending: Physician Assistant | Admitting: Speech Pathology

## 2023-02-21 VITALS — BP 112/64 | HR 79

## 2023-02-21 DIAGNOSIS — R41841 Cognitive communication deficit: Secondary | ICD-10-CM | POA: Diagnosis present

## 2023-02-21 DIAGNOSIS — R2689 Other abnormalities of gait and mobility: Secondary | ICD-10-CM | POA: Diagnosis present

## 2023-02-21 DIAGNOSIS — R2681 Unsteadiness on feet: Secondary | ICD-10-CM | POA: Diagnosis present

## 2023-02-21 DIAGNOSIS — I69318 Other symptoms and signs involving cognitive functions following cerebral infarction: Secondary | ICD-10-CM | POA: Diagnosis present

## 2023-02-21 DIAGNOSIS — M6281 Muscle weakness (generalized): Secondary | ICD-10-CM | POA: Insufficient documentation
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
## 2023-02-21 NOTE — Patient Instructions (Signed)
   Checkers Chess Connect 4 Uno  Card games Jig saw puzzles Easy cross words Memory match Board games [employer] Learn a new game!  Listen to and discuss Ted Talks or Podcasts Read and discuss short articles of interest to you- Take notes on these if memory is a challenge Discuss social media posts Look and discuss photo albums  The best activities to improve cognition are functional, real life activities that are important to you:  Plan a menu Participate in household chores and decisions (with supervision) Participate in managing finances Plan a party, trip or tailgate with all of the details (even if you aren't really going to carry it out) Participate in your hobby as you are able with assistance Manage your texts, emails with supervision if needed. Google search for items (even if you're not really going to buy anything) and compare prices and features Socialize -  however, too many visitors can be overwhelming, so set limits "My doctor said I should only visit (or talk) for 20 minutes" or "I do better when I visit with just 1-2 people at a time for 20 minutes"    It's good to use real in-person games, not just apps  Apps:  [employer] Elevate There are apps for most of the games listed above  
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
## 2023-02-21 NOTE — Transitions of Care (Post Inpatient/ED Visit) (Signed)
   02/21/2023  Name: Angela [name] MRN: [id number] DOB: [birth year]  Today's TOC FU Call Status: Today's TOC FU Call Status:: Unsuccessful Call (2nd Attempt) Unsuccessful Call (1st Attempt) Date: 02/20/23 Unsuccessful Call (2nd Attempt) Date: 02/21/23  Attempted to reach the patient regarding the most recent Inpatient/ED visit.  Follow Up Plan: Additional outreach attempts will be made to reach the patient to complete the Transitions of Care (Post Inpatient/ED visit) call.   Signature Eden Lathe, RN

## 2023-02-22 ENCOUNTER — Telehealth: Payer: Self-pay

## 2023-02-22 NOTE — Transitions of Care (Post Inpatient/ED Visit) (Signed)
   02/22/2023  Name: Angela [name] MRN: [id number] DOB: [birth year]  Today's TOC FU Call Status: Today's TOC FU Call Status:: Unsuccessful Call (3rd Attempt) Unsuccessful Call (1st Attempt) Date: 02/20/23 Unsuccessful Call (2nd Attempt) Date: 02/21/23 Unsuccessful Call (3rd Attempt) Date: 02/22/23  Attempted to reach the patient regarding the most recent Inpatient/ED visit.  Follow Up Plan: No further outreach attempts will be made at this time. We have been unable to contact the patient.  Patient has an appointment with Dr Wynetta Emery at [facility] on 03/07/2023.   Signature Eden Lathe, RN

## 2023-02-28 ENCOUNTER — Encounter: Payer: Self-pay | Admitting: Registered Nurse

## 2023-02-28 ENCOUNTER — Encounter: Payer: Medicaid Other | Attending: Registered Nurse | Admitting: Registered Nurse

## 2023-02-28 VITALS — BP 119/71 | HR 79 | Ht 64.0 in | Wt 152.6 lb

## 2023-02-28 DIAGNOSIS — N186 End stage renal disease: Secondary | ICD-10-CM | POA: Insufficient documentation

## 2023-02-28 DIAGNOSIS — I10 Essential (primary) hypertension: Secondary | ICD-10-CM | POA: Diagnosis not present

## 2023-02-28 DIAGNOSIS — E10649 Type 1 diabetes mellitus with hypoglycemia without coma: Secondary | ICD-10-CM | POA: Diagnosis not present

## 2023-02-28 DIAGNOSIS — Z992 Dependence on renal dialysis: Secondary | ICD-10-CM | POA: Diagnosis present

## 2023-02-28 DIAGNOSIS — I610 Nontraumatic intracerebral hemorrhage in hemisphere, subcortical: Secondary | ICD-10-CM

## 2023-02-28 DIAGNOSIS — E1042 Type 1 diabetes mellitus with diabetic polyneuropathy: Secondary | ICD-10-CM | POA: Insufficient documentation
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
## 2023-03-02 ENCOUNTER — Ambulatory Visit: Payer: Medicaid Other | Admitting: Physical Therapy

## 2023-03-02 VITALS — BP 110/65 | HR 80

## 2023-03-02 DIAGNOSIS — R41841 Cognitive communication deficit: Secondary | ICD-10-CM | POA: Diagnosis not present

## 2023-03-02 DIAGNOSIS — R2689 Other abnormalities of gait and mobility: Secondary | ICD-10-CM

## 2023-03-02 DIAGNOSIS — R2681 Unsteadiness on feet: Secondary | ICD-10-CM

## 2023-03-02 DIAGNOSIS — M6281 Muscle weakness (generalized): Secondary | ICD-10-CM
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
## 2023-03-07 ENCOUNTER — Encounter: Payer: Self-pay | Admitting: Internal Medicine

## 2023-03-07 ENCOUNTER — Ambulatory Visit: Payer: Medicaid Other | Attending: Internal Medicine | Admitting: Internal Medicine

## 2023-03-07 VITALS — BP 149/83 | HR 74 | Temp 98.2°F | Ht 64.0 in | Wt 141.0 lb

## 2023-03-07 DIAGNOSIS — N186 End stage renal disease: Secondary | ICD-10-CM | POA: Insufficient documentation

## 2023-03-07 DIAGNOSIS — G40909 Epilepsy, unspecified, not intractable, without status epilepticus: Secondary | ICD-10-CM | POA: Diagnosis not present

## 2023-03-07 DIAGNOSIS — G43709 Chronic migraine without aura, not intractable, without status migrainosus: Secondary | ICD-10-CM | POA: Insufficient documentation

## 2023-03-07 DIAGNOSIS — E039 Hypothyroidism, unspecified: Secondary | ICD-10-CM | POA: Diagnosis not present

## 2023-03-07 DIAGNOSIS — Z8673 Personal history of transient ischemic attack (TIA), and cerebral infarction without residual deficits: Secondary | ICD-10-CM | POA: Diagnosis not present

## 2023-03-07 DIAGNOSIS — F419 Anxiety disorder, unspecified: Secondary | ICD-10-CM | POA: Diagnosis not present

## 2023-03-07 DIAGNOSIS — Z09 Encounter for follow-up examination after completed treatment for conditions other than malignant neoplasm: Secondary | ICD-10-CM

## 2023-03-07 DIAGNOSIS — Z79899 Other long term (current) drug therapy: Secondary | ICD-10-CM | POA: Insufficient documentation

## 2023-03-07 DIAGNOSIS — I610 Nontraumatic intracerebral hemorrhage in hemisphere, subcortical: Secondary | ICD-10-CM

## 2023-03-07 DIAGNOSIS — E1029 Type 1 diabetes mellitus with other diabetic kidney complication: Secondary | ICD-10-CM

## 2023-03-07 DIAGNOSIS — E1043 Type 1 diabetes mellitus with diabetic autonomic (poly)neuropathy: Secondary | ICD-10-CM | POA: Insufficient documentation

## 2023-03-07 DIAGNOSIS — E1022 Type 1 diabetes mellitus with diabetic chronic kidney disease: Secondary | ICD-10-CM | POA: Diagnosis not present

## 2023-03-07 DIAGNOSIS — Z992 Dependence on renal dialysis: Secondary | ICD-10-CM | POA: Diagnosis not present

## 2023-03-07 DIAGNOSIS — Z794 Long term (current) use of insulin: Secondary | ICD-10-CM | POA: Insufficient documentation

## 2023-03-07 DIAGNOSIS — R590 Localized enlarged lymph nodes: Secondary | ICD-10-CM | POA: Diagnosis not present

## 2023-03-07 DIAGNOSIS — I120 Hypertensive chronic kidney disease with stage 5 chronic kidney disease or end stage renal disease: Secondary | ICD-10-CM

## 2023-03-07 DIAGNOSIS — F331 Major depressive disorder, recurrent, moderate: Secondary | ICD-10-CM

## 2023-03-07 DIAGNOSIS — E010 Iodine-deficiency related diffuse (endemic) goiter: Secondary | ICD-10-CM

## 2023-03-07 DIAGNOSIS — K3184 Gastroparesis: Secondary | ICD-10-CM | POA: Diagnosis not present

## 2023-03-07 MED ORDER — ONDANSETRON 4 MG PO TBDP: ORAL_TABLET | ORAL | 1 refills | Status: DC

## 2023-03-07 MED ORDER — ESCITALOPRAM OXALATE 20 MG PO TABS: 20.0000 mg | ORAL_TABLET | Freq: Every day | ORAL | 3 refills | Status: DC

## 2023-03-07 MED ORDER — LANTHANUM CARBONATE 1000 MG PO CHEW: 1000.0000 mg | CHEWABLE_TABLET | Freq: Three times a day (TID) | ORAL | 2 refills | Status: DC

## 2023-03-07 NOTE — Progress Notes (Unsigned)
Patient ID: Angela [name], female    DOB: [birth year]  MRN: [id number]  CC: Hospitalization Follow-up [name] f/u. Med refill - zofran/Requesting Lanthanum Carbonate (Chew) to be prescribed. Angela [name] if pt received flu vax, will schedule nurse appt if not. )   Subjective: Angela [name] is a 37 y.o. 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  I have copied below the history from EMR discharge summary:    Angela [name] is a 37 y.o. female with a history of ESRD on HD who presented to the [name] ED unresponsiveness and change in mental status at HD clinic on 01/27/2023. She awaked to tactile stimulus and was moving all four extremities. 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 CTA of head performed. Hypertension (sys 200s) treated with IV labetalol and Cleviprex. Her mental status worsened and she was intubated for airway protection. Neurosurgery and CCM consulted and continued to follow. Ventriculostomy drain placed on 2/09. Required hypertonic saline. Hemoglobin down to 6.4 and given one unit of PRBCs. Heparin Wade started for VTE prophy. Cortrak placed. She has a history of seizure disorder on Keppra, uncontrolled DM-1 (A1c = 12.0). Nephrology consulted for HD orders. Repeat CTH on 2/12 with decreased size of ventricles. She was extubated on 2/12. Neuro status improved and EVD clamped on 2/15. Repeat CT head on 2/16 without hydrocephalus and significant clearing of IVH. EVD removed and NS signed off. Swallow evaluation on  2/16.  Patient subsequently admitted to inpatient rehab and stayed there from 2/17-29/2024. Since discharge, she has been attending physical, occupational and speech therapy. Seen by PMR in follow-up on 02/28/2023.  She is drinking regular liquids and eating solid foods regular consistency. She reports having some residual tingling in her fingers and feeling of weakness in her legs.  HTN: Mother has been checking her blood pressure regularly at home.  She has been in the 130s over 80s.  Mother has impressed on her the importance of taking her blood pressure medicines consistently.  Currently on amlodipine 10 mg daily, carvedilol 6.25 mg twice a day, diltiazem 60 mg every 6 hours, hydralazine 100 mg 3 times a day  ESR: Goes to dialysis Monday Wednesday and Fridays.  Request refill on Zofran to use as needed for nausea.  DM: Most recent A1c on 01/28/2023 was 12.  Next appointment with endocrinologist is in May.  Mother has tried to get the appointment moved up.  Should be on Tresiba 12 units daily but has been taking it as 5 units in the mornings and 5 units in the evenings.  Also on NovoLog taking 2 to 3 units with meals.  Has Dexcom device but mother states she does not want to use it.  Mother has been checking blood sugars several times a day.  Lowest has been 71.  Blood sugar this morning was 89.  During the day blood sugar range has been 185-195. Doing better with eating habits.  Seizure disorder: Reports compliance with taking Keppra.  No seizures since hospital discharge.  Depression: Positive PHQ-9 score today.  She is on Lexapro 20 mg daily.  Also on  BuSpar for anxiety.  Patient admits that she has been very depressed since hospitalization.  Mother states that she lays around all day not wanting to get up or do anything.  She denies any plans of hurting or harming herself at this time.  History of thyroid nodules and thyromegaly.  Biopsy of nodule in the right lower gland and the isthmus came  back showing follicular lesion of undetermined significance.  Biopsy was done 12/2022 through [name] endocrinology. I do not see the subsequent Afirma test in Care Everywhere.  Patient states she has not received any further information either. Patient denies any problems swallowing or shortness of breath when she lays down at night.  She has had some lymphadenopathy on the left side of the neck on CT that was done on in December.  Radiologist read it as asymmetric but mostly subcentimeter lymph nodes at the left lower neck probably no significant change from cervical CT done 09/2021.  Recommend follow-up on clinical exam and with repeat CT with contrast. Patient had sent me a MyChart message sometime last year reporting some inguinal lymphadenopathy but had failed to follow-up with me.  She states they are still there.  She denies any unexplained weight changes.  No fever or night sweats.    Patient Active Problem List   Diagnosis Date Noted   Protein calorie malnutrition ([place]) 02/15/2023   ICH (intracerebral hemorrhage) ([place]) 01/27/2023   DKA (diabetic ketoacidosis) ([place]) 12/16/2022   Seizure ([place]) 10/15/2021   ESRD on dialysis ([place]) 10/15/2021   Influenza vaccine needed 12/28/2020   Mallory-Weiss tear 11/16/2020   Hypertensive urgency 11/16/2020   Chronic migraine without aura without status migrainosus, not intractable 04/09/2020   Occipital neuralgia of right side 04/09/2020   Anxiety about health 10/29/2019   Cannabis abuse, episodic 10/29/2019   Abnormal uterine bleeding (AUB) 08/13/2019   Ovarian mass, left 11/27/2018   Moderate major depression ([place]) 11/27/2018   Right flank mass 05/03/2018   Secondary hyperparathyroidism ([place]) 05/01/2018   Anxiety and depression 06/23/2017   Insomnia 01/12/2016   Non compliance with medical treatment 01/05/2016   Mixed hyperlipidemia 01/05/2016   Neurogenic bladder 02/20/2015   Diabetic peripheral neuropathy associated with type  1 diabetes mellitus ([place]) 02/20/2015   Iron deficiency 02/13/2015   Asthma 09/30/2014   Diabetic retinopathy ([place]) 09/30/2014   Atypical squamous cells of undetermined significance (ASCUS) on Papanicolaou smear of cervix 08/11/2014   Diabetic gastroparesis associated with type 1 diabetes mellitus ([place]) 08/05/2014   Anemia in chronic renal disease 08/05/2014   HTN (hypertension) 07/11/2012   GERD (gastroesophageal reflux disease) 09/26/2011   Hypothyroidism 09/14/2006   Uncontrolled type 1 diabetes mellitus with hypoglycemia, with long-term current use of insulin ([place]) 01/15/2000     Current Outpatient Medications on File Prior to Visit  Medication Sig Dispense Refill   acetaminophen (TYLENOL) 325 MG tablet Take 2 tablets (650 mg total) by mouth every 6 (six) hours as needed for headache.     albuterol (PROVENTIL HFA) 108 (90 Base) MCG/ACT inhaler Inhale 2 puffs into the lungs every 6 (six) hours as needed for wheezing or shortness of breath. 8 g 3   amLODipine (NORVASC) 10 MG tablet Take 1 tablet (10 mg total) by mouth at bedtime. 30 tablet 0   busPIRone (BUSPAR) 15 MG tablet TAKE ONE TABLET BY MOUTH EVERYDAY AT BEDTIME (Patient taking differently: Take 15 mg by mouth at bedtime.) 90 tablet 0   carvedilol (COREG) 6.25 MG tablet Take 1 tablet (6.25  mg total) by mouth 2 (two) times daily with a meal. 60 tablet 0   Continuous Blood Gluc Sensor (DEXCOM G6 SENSOR) MISC APPLY 1 SENSOR EVERY 10 DAYS     Continuous Blood Gluc Sensor (DEXCOM G6 SENSOR) MISC SMARTSIG:1 Topical Every 10 Days     Continuous Blood Gluc Transmit (DEXCOM G6 TRANSMITTER) MISC See admin instructions.     Darbepoetin Alfa (ARANESP) 100 MCG/0.5ML SOSY injection Inject 0.5 mLs (100 mcg total) into the skin every Friday at 6 PM. 4.2 mL 0   diltiazem (CARDIZEM) 60 MG tablet Take 1 tablet (60 mg total) by mouth every 6 (six) hours. 120 tablet 0   escitalopram (LEXAPRO) 20 MG tablet TAKE ONE TABLET BY MOUTH ONCE DAILY (Patient  taking differently: Take 20 mg by mouth daily.) 90 tablet 0   gabapentin (NEURONTIN) 100 MG capsule Take 1 capsule (100 mg total) by mouth 3 (three) times daily. 90 capsule 0   hydrALAZINE (APRESOLINE) 100 MG tablet Take 100 mg by mouth 3 (three) times daily.     insulin aspart (NOVOLOG FLEXPEN) 100 UNIT/ML FlexPen Inject 2-8 Units into the skin 3 (three) times daily with meals. Per sliding scale     insulin glargine-yfgn (SEMGLEE) 100 UNIT/ML injection Inject 0.06 mLs (6 Units total) into the skin 2 (two) times daily. 10 mL 11   Insulin Pen Needle (COMFORT EZ PEN NEEDLES) 32G X 4 MM MISC USE TO INJECT insulin ONCE DAILY AS DIRECTED 100 each 3   Insulin Syringe-Needle U-100 (BD INSULIN SYRINGE ULTRAFINE) 31G X 15/64" 1 ML MISC Used to give daily insulin injections. 100 each 11   levETIRAcetam (KEPPRA) 500 MG tablet Take 1 tablet (500 mg total) by mouth 2 (two) times daily. 60 tablet 0   levothyroxine (SYNTHROID) 175 MCG tablet Take 175 mcg by mouth every morning.     lidocaine (LIDODERM) 5 % Place 1 patch onto the skin daily. Remove & Discard patch within 12 hours or as directed by MD 30 patch 0   metoCLOPramide (REGLAN) 5 MG tablet Take 1 tablet (5 mg total) by mouth 2 (two) times daily with a meal. 60 tablet 0   multivitamin (RENA-VIT) TABS tablet Take 1 tablet by mouth at bedtime.     ondansetron (ZOFRAN-ODT) 4 MG disintegrating tablet 4mg  ODT q4 hours prn nausea/vomit 10 tablet 0   pantoprazole (PROTONIX) 40 MG tablet Take 1 tablet (40 mg total) by mouth daily. 30 tablet 0   senna-docusate (SENOKOT-S) 8.6-50 MG tablet Take 1 tablet by mouth 2 (two) times daily.     TRESIBA FLEXTOUCH 100 UNIT/ML FlexTouch Pen Inject 12 Units into the skin daily.     lanthanum (FOSRENOL) 1000 MG chewable tablet Chew 1 tablet (1,000 mg total) by mouth with breakfast, with lunch, and with evening meal. (Patient not taking: Reported on 02/28/2023) 90 tablet 0   Current Facility-Administered Medications on File Prior  to Visit  Medication Dose Route Frequency Provider Last Rate Last Admin   adenosine (diagnostic) (ADENOSCAN) infusion 32.4 mg  0.56 mg/kg Intravenous Once Jerline Pain, MD        Allergies  Allergen Reactions   Bactrim [Sulfamethoxazole-Trimethoprim] Hives and Itching   Feraheme [Ferumoxytol] Itching   Zestril [Lisinopril] Other (See Comments)    Hyperkalemia     Social History   Socioeconomic History   Marital status: Divorced    Spouse name: Not on file   Number of children: 0   Years of education: 2 years of college  Highest education level: Not on file  Occupational History   Occupation: unemployed    Comment: worked at a group  Tobacco Use   Smoking status: Former    Packs/day: 0.25    Years: 2.00    Additional pack years: 0.00    Total pack years: 0.50    Types: Cigarettes    Quit date: 03/04/2013    Years since quitting: 10.0   Smokeless tobacco: Never  Vaping Use   Vaping Use: Never used  Substance and Sexual Activity   Alcohol use: No    Alcohol/week: 0.0 standard drinks of alcohol   Drug use: Yes    Frequency: 4.0 times per week    Types: Marijuana   Sexual activity: Yes    Partners: Female    Birth control/protection: None    Comment: women preference   Other Topics Concern   Not on file  Social History Narrative   Occupation: currently unemployed   Single   Homosexual,     Used to be a gang member, got arrested for robbing a gas station (March - June 2012), is cleared now and lives away from her previous friends.          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        Update 04/09/2020   Has own apartment   Caffeine: maybe 2 cans of soda/day    Right handed   Social Determinants of Health   Financial Resource Strain: Medium Risk (01/20/2022)   Overall Financial Resource Strain  (CARDIA)    Difficulty of Paying Living Expenses: Somewhat hard  Food Insecurity: No Food Insecurity (01/25/2022)   Hunger Vital Sign    Worried About Running Out of Food in the Last Year: Never true    Ran Out of Food in the Last Year: Never true  Transportation Needs: No Transportation Needs (01/24/2022)   PRAPARE - Hydrologist (Medical): No    Lack of Transportation (Non-Medical): No  Physical Activity: Inactive (10/29/2019)   Exercise Vital Sign    Days of Exercise per Week: 0 days    Minutes of Exercise per Session: 0 min  Stress: Not on file  Social Connections: Not on file  Intimate Partner Violence: Not on file    Family History  Problem Relation Age of Onset   Multiple sclerosis Mother    Hypothyroidism Mother    Stroke Mother        at age 3 yo   Migraines Mother    Hyperlipidemia Maternal Grandmother    Hypertension Maternal Grandmother    Heart disease Maternal Grandmother        unknown type   Diabetes Maternal Grandmother    Hypertension Maternal Grandfather    Prostate cancer Maternal Grandfather    Diabetes type I Maternal Grandfather    Breast cancer Paternal Grandmother    Cancer Neg Hx     Past Surgical History:  Procedure Laterality Date   FOOT FUSION Right 2006   "put screws in it too" (09/19/2013)    ROS: Review of Systems Negative except as stated above  PHYSICAL EXAM: BP (!) 149/83 (BP Location: Left Arm, Patient Position: Sitting, Cuff Size: Normal)   Pulse 74   Temp 98.2 F (36.8 C) (Oral)   Ht 5\' 4"  (1.626 m)  Wt 141 lb (64 kg)   LMP 02/07/2023 (Approximate)   SpO2 98%   BMI 24.20 kg/m   Physical Exam  General appearance -young African-American female who appears chronically ill. Mental status -flat affect. Neck -thyromegaly more so on the right side with palpable nodule. Chest - clear to auscultation, no wheezes, rales or rhonchi, symmetric air entry Heart -regular rate rhythm.  Systolic ejection  murmur heard on the left upper sternal border Pelvic -1 small shotty lymph node felt in the right inguinal area.  No axillary lymphadenopathy.  I did not feel any supraclavicular lymphadenopathy or cervical lymphadenopathy. Extremities -lower extremity edema.     03/07/2023    2:29 PM 02/28/2023    1:46 PM 01/24/2022    9:21 AM  Depression screen PHQ 2/9  Decreased Interest 3 3 3   Down, Depressed, Hopeless 2 3 3   PHQ - 2 Score 5 6 6   Altered sleeping 3 3 2   Tired, decreased energy 3 3 3   Change in appetite 1 0 1  Feeling bad or failure about yourself  2 0 3  Trouble concentrating 2 3 1   Moving slowly or fidgety/restless 2 2 1   Suicidal thoughts 2 1 1   PHQ-9 Score 20 18 18   Difficult doing work/chores   Somewhat difficult    Lab Results  Component Value Date   HGBA1C 12.0 (H) 01/28/2023       Latest Ref Rng & Units 02/16/2023    7:00 AM 02/15/2023    5:24 PM 02/15/2023    7:17 AM  CMP  Glucose 70 - 99 mg/dL 98  263  568   BUN 6 - 20 mg/dL 21   26   Creatinine 0.44 - 1.00 mg/dL 5.30   6.94   Sodium 135 - 145 mmol/L 131   119   Potassium 3.5 - 5.1 mmol/L 4.4   5.7   Chloride 98 - 111 mmol/L 93   84   CO2 22 - 32 mmol/L 26   21   Calcium 8.9 - 10.3 mg/dL 10.0   9.5    Lipid Panel     Component Value Date/Time   CHOL 247 (H) 01/28/2023 0300   CHOL 239 (H) 12/28/2020 1432   TRIG 88 01/31/2023 0548   HDL 119 01/28/2023 0300   HDL 72 12/28/2020 1432   CHOLHDL 2.1 01/28/2023 0300   VLDL 13 01/28/2023 0300   LDLCALC 115 (H) 01/28/2023 0300   LDLCALC 151 (H) 12/28/2020 1432    CBC    Component Value Date/Time   WBC 7.9 02/15/2023 0851   RBC 3.15 (L) 02/15/2023 0851   HGB 9.4 (L) 02/15/2023 0851   HGB 8.0 (L) 12/28/2020 1432   HCT 30.1 (L) 02/15/2023 0851   HCT 25.5 (L) 12/28/2020 1432   PLT 189 02/15/2023 0851   PLT 146 (L) 12/28/2020 1432   MCV 95.6 02/15/2023 0851   MCV 88 12/28/2020 1432   MCH 29.8 02/15/2023 0851   MCHC 31.2 02/15/2023 0851   RDW 17.1 (H)  02/15/2023 0851   RDW 11.9 12/28/2020 1432   LYMPHSABS 2.1 02/11/2023 1207   LYMPHSABS 2.3 05/21/2018 1707   MONOABS 0.7 02/11/2023 1207   EOSABS 0.4 02/11/2023 1207   EOSABS 0.0 05/21/2018 1707   BASOSABS 0.1 02/11/2023 1207   BASOSABS 0.0 05/21/2018 1707    ASSESSMENT AND PLAN: 1. Hospital discharge follow-up   2. Nontraumatic subcortical hemorrhage of left cerebral hemisphere [facility]) Discussed importance of taking blood pressure medicines consistently.  Patient  currently in physical and Occupational Therapy.  3. Supraclavicular lymphadenopathy Not palpable today on exam.  However we will follow-up with CT with contrast of the soft tissue of the neck and also the chest.  We will plan to have this done around the end of next month since she is currently busy going to various therapies. Informed patient and mother that I have been in contact with her nephrologist Dr. Ronnald Ramp at [facility] to inquire whether it would be safe for [place] to give contrast given that she has end-stage renal disease.  Informed them of her response.   Pt agreeable to proceeding with contrast study.  4. 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, with long-term current use of insulin ([place]) Discussed the importance of taking her insulin consistently.  She will keep appointment with her endocrinologist in May hopefully to get back on her insulin pump. Encouraged her to use her Dexcom device.  She promises to put it on today and start using it.  5. Hypertensive kidney disease with ESRD on dialysis [facility]) Not completely at goal but much better compared to previous visits.  Strongly encouraged her to be compliant with taking blood pressure medications as listed above. - lanthanum (FOSRENOL) 1000 MG chewable tablet; Chew 1 tablet (1,000 mg total) by mouth with breakfast, with lunch, and with evening meal.  Dispense: 90 tablet; Refill: 2  6. Seizure disorder ([place]) Continue Keppra.  7. Thyromegaly Advised to  ask about Afrima test when she sees the endocrinologist and may.  This determines whether there is a likelihood of nodules being cancerous or not.  8. MDD (major depressive disorder), recurrent episode, moderate ([place]) Even though she Ansaid positive on PHQ-9 screening, patient denies any thoughts of harming herself at this time.  She is agreeable to being plugged in with behavioral health.  Referral submitted.  I will also have our LCSW follow-up with her.  Continue Lexapro. - escitalopram (LEXAPRO) 20 MG tablet; Take 1 tablet (20 mg total) by mouth daily.  Dispense: 30 tablet; Refill: 3     Patient was given the opportunity to ask questions.  Patient verbalized understanding of the plan and was able to repeat key elements of the plan.   This documentation was completed using Radio producer.  Any transcriptional errors are unintentional.  No orders of the defined types were placed in this encounter.    Requested Prescriptions    No prescriptions requested or ordered in this encounter    No follow-ups on file.  Karle Plumber, MD, FACP

## 2023-03-09 ENCOUNTER — Encounter: Payer: Self-pay | Admitting: Physical Therapy

## 2023-03-09 ENCOUNTER — Ambulatory Visit: Payer: Medicaid Other | Admitting: Physical Therapy

## 2023-03-09 VITALS — BP 132/77 | HR 80

## 2023-03-09 DIAGNOSIS — I69318 Other symptoms and signs involving cognitive functions following cerebral infarction: Secondary | ICD-10-CM

## 2023-03-09 DIAGNOSIS — R2689 Other abnormalities of gait and mobility: Secondary | ICD-10-CM

## 2023-03-09 DIAGNOSIS — M6281 Muscle weakness (generalized): Secondary | ICD-10-CM

## 2023-03-09 DIAGNOSIS — R41841 Cognitive communication deficit: Secondary | ICD-10-CM | POA: Diagnosis not present

## 2023-03-09 DIAGNOSIS — R2681 Unsteadiness on feet: Secondary | ICD-10-CM
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
## 2023-03-10 ENCOUNTER — Encounter: Payer: Self-pay | Admitting: Internal Medicine

## 2023-03-10 ENCOUNTER — Other Ambulatory Visit: Payer: Self-pay

## 2023-03-13 ENCOUNTER — Other Ambulatory Visit: Payer: Self-pay | Admitting: Internal Medicine

## 2023-03-13 ENCOUNTER — Other Ambulatory Visit: Payer: Self-pay

## 2023-03-13 ENCOUNTER — Encounter: Payer: Self-pay | Admitting: Internal Medicine

## 2023-03-13 DIAGNOSIS — F331 Major depressive disorder, recurrent, moderate: Secondary | ICD-10-CM

## 2023-03-13 DIAGNOSIS — F418 Other specified anxiety disorders: Secondary | ICD-10-CM

## 2023-03-13 MED ORDER — TRESIBA FLEXTOUCH 100 UNIT/ML ~~LOC~~ SOPN: 5.0000 [IU] | PEN_INJECTOR | Freq: Two times a day (BID) | SUBCUTANEOUS | 6 refills | Status: DC

## 2023-03-13 MED ORDER — CARVEDILOL 6.25 MG PO TABS: 6.2500 mg | ORAL_TABLET | Freq: Two times a day (BID) | ORAL | 6 refills | Status: DC

## 2023-03-13 MED ORDER — METOCLOPRAMIDE HCL 5 MG PO TABS: 5.0000 mg | ORAL_TABLET | Freq: Two times a day (BID) | ORAL | 1 refills | Status: DC

## 2023-03-13 MED ORDER — DILTIAZEM HCL 60 MG PO TABS: 60.0000 mg | ORAL_TABLET | Freq: Four times a day (QID) | ORAL | 6 refills | Status: DC

## 2023-03-13 MED ORDER — NOVOLOG FLEXPEN 100 UNIT/ML ~~LOC~~ SOPN: 2.0000 [IU] | PEN_INJECTOR | Freq: Three times a day (TID) | SUBCUTANEOUS | 6 refills | Status: DC

## 2023-03-13 MED ORDER — LEVETIRACETAM 500 MG PO TABS: 500.0000 mg | ORAL_TABLET | Freq: Two times a day (BID) | ORAL | 1 refills | Status: DC

## 2023-03-13 MED ORDER — PANTOPRAZOLE SODIUM 40 MG PO TBEC: 40.0000 mg | DELAYED_RELEASE_TABLET | Freq: Every day | ORAL | 1 refills | Status: DC

## 2023-03-13 MED ORDER — HYDRALAZINE HCL 100 MG PO TABS: 100.0000 mg | ORAL_TABLET | Freq: Three times a day (TID) | ORAL | 1 refills | Status: DC

## 2023-03-13 MED ORDER — GABAPENTIN 100 MG PO CAPS: 100.0000 mg | ORAL_CAPSULE | Freq: Three times a day (TID) | ORAL | 1 refills | Status: DC

## 2023-03-15 ENCOUNTER — Telehealth: Payer: Self-pay | Admitting: Internal Medicine

## 2023-03-15 NOTE — Telephone Encounter (Addendum)
Phone call placed today to [facility] endocrinology.  I left a message through the physician line to have patient's endocrinologist Dr. Kathlene Cote returned my call regarding this patient.  I would like to inquire whether additional testing done on thyroid bx specimen has resulted as  yet as this would inform decision about whether I still need to move forward with repeat CT neck/chest with contrast.   Addendum: I received a call back from Dr. Denton Lank.  He confirms that genetic testing from the thyroid biopsy came back negative.  He recommends repeat ultrasound in 1 year.

## 2023-03-15 NOTE — Telephone Encounter (Signed)
-----   Message from Ena Dawley sent at 03/14/2023  2:59 PM EDT ----- Regarding: Psyquiatry  Referral Sent Referral to   [facility] [address]  [place], [place] Phone: [phone] Fax: [phone]  ----- Message ----- From: [name], MD Sent: 03/13/2023  10:29 PM EDT To: Ena Dawley  Please try to get her in with Psychiatry/Behavioral Health.

## 2023-03-16 ENCOUNTER — Ambulatory Visit: Payer: Medicaid Other | Admitting: Physical Therapy

## 2023-03-21 ENCOUNTER — Encounter: Payer: Self-pay | Admitting: Neurology

## 2023-03-21 ENCOUNTER — Telehealth: Payer: Self-pay | Admitting: Neurology

## 2023-03-21 ENCOUNTER — Ambulatory Visit (INDEPENDENT_AMBULATORY_CARE_PROVIDER_SITE_OTHER): Payer: Medicaid Other | Admitting: Neurology

## 2023-03-21 ENCOUNTER — Ambulatory Visit
Admission: RE | Admit: 2023-03-21 | Discharge: 2023-03-21 | Disposition: A | Discharge status: Home / Self Care | Payer: Medicaid Other | Source: Ambulatory Visit | Attending: Neurology | Admitting: Neurology

## 2023-03-21 VITALS — BP 140/77 | HR 77 | Ht 64.0 in | Wt 142.0 lb

## 2023-03-21 DIAGNOSIS — I6389 Other cerebral infarction: Secondary | ICD-10-CM

## 2023-03-21 DIAGNOSIS — R5383 Other fatigue: Secondary | ICD-10-CM

## 2023-03-21 NOTE — Telephone Encounter (Signed)
Left message per dpr scanned 03/03/2023. Also sent mychart message. Good news, CT of the head looks good, improved, expected changes of hematoma with reduction in size thank

## 2023-03-21 NOTE — Patient Instructions (Signed)
Repeat bloodwork CT of the head Sleep doctor - sleep study   Sleep Apnea Sleep apnea is a condition in which breathing pauses or becomes shallow during sleep. People with sleep apnea usually snore loudly. They may have times when they gasp and stop breathing for 10 seconds or more during sleep. This may happen many times during the night. Sleep apnea disrupts your sleep and keeps your body from getting the rest that it needs. This condition can increase your risk of certain health problems, including: Heart attack. Stroke. Obesity. Type 2 diabetes. Heart failure. Irregular heartbeat. High blood pressure. The goal of treatment is to help you breathe normally again. What are the causes?  The most common cause of sleep apnea is a collapsed or blocked airway. There are three kinds of sleep apnea: Obstructive sleep apnea. This kind is caused by a blocked or collapsed airway. Central sleep apnea. This kind happens when the part of the brain that controls breathing does not send the correct signals to the muscles that control breathing. Mixed sleep apnea. This is a combination of obstructive and central sleep apnea. What increases the risk? You are more likely to develop this condition if you: Are overweight. Smoke. Have a smaller than normal airway. Are older. Are female. Drink alcohol. Take sedatives or tranquilizers. Have a family history of sleep apnea. Have a tongue or tonsils that are larger than normal. What are the signs or symptoms? Symptoms of this condition include: Trouble staying asleep. Loud snoring. Morning headaches. Waking up gasping. Dry mouth or sore throat in the morning. Daytime sleepiness and tiredness. If you have daytime fatigue because of sleep apnea, you may be more likely to have: Trouble concentrating. Forgetfulness. Irritability or mood swings. Personality changes. Feelings of depression. Sexual dysfunction. This may include loss of interest if you  are female, or erectile dysfunction if you are female. How is this diagnosed? This condition may be diagnosed with: A medical history. A physical exam. A series of tests that are done while you are sleeping (sleep study). These tests are usually done in a sleep lab, but they may also be done at home. How is this treated? Treatment for this condition aims to restore normal breathing and to ease symptoms during sleep. It may involve managing health issues that can affect breathing, such as high blood pressure or obesity. Treatment may include: Sleeping on your side. Using a decongestant if you have nasal congestion. Avoiding the use of depressants, including alcohol, sedatives, and narcotics. Losing weight if you are overweight. Making changes to your diet. Quitting smoking. Using a device to open your airway while you sleep, such as: An oral appliance. This is a custom-made mouthpiece that shifts your lower jaw forward. A continuous positive airway pressure (CPAP) device. This device blows air through a mask when you breathe out (exhale). A nasal expiratory positive airway pressure (EPAP) device. This device has valves that you put into each nostril. A bi-level positive airway pressure (BIPAP) device. This device blows air through a mask when you breathe in (inhale) and breathe out (exhale). Having surgery if other treatments do not work. During surgery, excess tissue is removed to create a wider airway. Follow these instructions at home: Lifestyle Make any lifestyle changes that your health care provider recommends. Eat a healthy, well-balanced diet. Take steps to lose weight if you are overweight. Avoid using depressants, including alcohol, sedatives, and narcotics. Do not use any products that contain nicotine or tobacco. These products include cigarettes, chewing  tobacco, and vaping devices, such as e-cigarettes. If you need help quitting, ask your health care provider. General  instructions Take over-the-counter and prescription medicines only as told by your health care provider. If you were given a device to open your airway while you sleep, use it only as told by your health care provider. If you are having surgery, make sure to tell your health care provider you have sleep apnea. You may need to bring your device with you. Keep all follow-up visits. This is important. Contact a health care provider if: The device that you received to open your airway during sleep is uncomfortable or does not seem to be working. Your symptoms do not improve. Your symptoms get worse. Get help right away if: You develop: Chest pain. Shortness of breath. Discomfort in your back, arms, or stomach. You have: Trouble speaking. Weakness on one side of your body. Drooping in your face. These symptoms may represent a serious problem that is an emergency. Do not wait to see if the symptoms will go away. Get medical help right away. Call your local emergency services (911 in the [place] Do not drive yourself to the hospital. Summary Sleep apnea is a condition in which breathing pauses or becomes shallow during sleep. The most common cause is a collapsed or blocked airway. The goal of treatment is to restore normal breathing and to ease symptoms during sleep. This information is not intended to replace advice given to you by your health care provider. Make sure you discuss any questions you have with your health care provider. Document Revised: 07/14/2021 Document Reviewed: 11/13/2020 Elsevier Patient Education  [place].

## 2023-03-21 NOTE — Progress Notes (Addendum)
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 and be comforting - Call 911. In most cases, the seizure ends before EMS arrives. However, there are cases when seizures may last over 3 to 5 minutes. Or the individual may have developed breathing difficulties or severe injuries. If a pregnant patient or a person with diabetes develops a seizure, it is prudent to call an ambulance. - Finally, if the patient does not regain full consciousness, then call EMS. Most patients will remain confused for about 45 to 90 minutes after a seizure, so you must use judgment in calling for help. - Avoid restraints but make sure the patient is in a bed with padded side rails - Place the individual in a lateral position with the neck slightly flexed; this will help the saliva drain from the mouth and prevent the tongue from falling backward - Remove all nearby furniture and other hazards from the area - Provide verbal assurance as the individual is regaining consciousness - Provide the  patient with privacy if possible - Call for help and start treatment as ordered by the caregiver   fter the Seizure (Postictal Stage)  After a seizure, most patients experience confusion, fatigue, muscle pain and/or a headache. Thus, one should permit the individual to sleep. For the next few days, reassurance is essential. Being calm and helping reorient the person is also of importance.  Most seizures are painless and end spontaneously. Seizures are not harmful to others but can lead to complications such as stress on the lungs, brain and the heart. Individuals with prior lung problems may develop labored breathing and respiratory distress.   Orders Placed This Encounter  Procedures   CT HEAD WO CONTRAST ( )   CBC with Differential/Platelets   Comprehensive metabolic panel   TSH Rfx on Abnormal to Free T4   T4F   No orders of the defined types were placed in this encounter.  Discussed: To prevent or relieve headaches, try the following: Cool Compress. Lie down and place a cool compress on your head.  Avoid headache triggers. If certain foods or odors seem to have triggered your migraines in the past, avoid them. A headache diary might help you identify triggers.  Include physical activity in your daily routine. Try a daily walk or other moderate aerobic exercise.  Manage stress. Find healthy ways to cope with the stressors, such as delegating tasks on your to-do list.  Practice relaxation techniques. Try deep breathing, yoga, massage and visualization.  Eat regularly. Eating regularly scheduled meals and maintaining a healthy diet might help prevent headaches. Also, drink plenty of fluids.  Follow a regular sleep schedule. Sleep deprivation might contribute to headaches Consider biofeedback. With this mind-body technique, you learn to control certain bodily functions -- such as muscle tension, heart rate and blood pressure -- to prevent headaches or reduce headache pain.    Proceed  to emergency room if you experience new or worsening symptoms or symptoms do not resolve, if you have new neurologic symptoms or if headache is severe, or for any concerning symptom.  Provided education and documentation from American headache Society toolbox including articles on: chronic migraine medication overuse headache, chronic migraines, prevention of migraines, behavioral and other nonpharmacologic treatments for headache.  Cced dr Laural Benes on results review, sent a mychart message and forwarded: FYI Dr. Laural Benes her LFTs are very elevated and her platelets are down. I don;t know why so I have to throw this back to you so sorry!  Alkaline Phosphatase 44 - 121 IU/L 675 High     102 R   AST 0 - 40 IU/L 172 High     26 R   ALT 0 - 32 IU/L 176 High         Platelets 126  Doesn't look like they were this bad while inpatient?   Cc: Marvel Plan, MD,    Naomie Dean, MD  [facility] [address] [place], [place] [address]  Phone [phone] Fax [phone]  I spent over 85 minutes of face-to-face and non-face-to-face time with patient on the  1. Acute hemorrhagic infarction of brain   2. Other fatigue    diagnosis.  This included previsit chart review, lab review, study review, order entry, electronic health record documentation, patient education on the different diagnostic and therapeutic options, counseling and coordination of care, risks and benefits of management, compliance, or risk factor reduction

## 2023-03-22 LAB — CBC WITH DIFFERENTIAL/PLATELET
Basophils Absolute: 0.1 10*3/uL (ref 0.0–0.2)
Basos: 1 %
EOS (ABSOLUTE): 0.8 10*3/uL — ABNORMAL HIGH (ref 0.0–0.4)
Eos: 13 %
Hematocrit: 29.6 % — ABNORMAL LOW (ref 34.0–46.6)
Hemoglobin: 9.3 g/dL — ABNORMAL LOW (ref 11.1–15.9)
Immature Grans (Abs): 0 10*3/uL (ref 0.0–0.1)
Immature Granulocytes: 1 %
Lymphocytes Absolute: 1.3 10*3/uL (ref 0.7–3.1)
Lymphs: 20 %
MCH: 28.4 pg (ref 26.6–33.0)
MCHC: 31.4 g/dL — ABNORMAL LOW (ref 31.5–35.7)
MCV: 91 fL (ref 79–97)
Monocytes Absolute: 0.6 10*3/uL (ref 0.1–0.9)
Monocytes: 9 %
Neutrophils Absolute: 3.5 10*3/uL (ref 1.4–7.0)
Neutrophils: 56 %
Platelets: 126 10*3/uL — ABNORMAL LOW (ref 150–450)
RBC: 3.27 x10E6/uL — ABNORMAL LOW (ref 3.77–5.28)
RDW: 13.7 % (ref 11.7–15.4)
WBC: 6.2 10*3/uL (ref 3.4–10.8)

## 2023-03-22 LAB — COMPREHENSIVE METABOLIC PANEL
ALT: 176 IU/L — ABNORMAL HIGH (ref 0–32)
AST: 172 IU/L — ABNORMAL HIGH (ref 0–40)
Albumin/Globulin Ratio: 1.8 (ref 1.2–2.2)
Albumin: 4.6 g/dL (ref 3.9–4.9)
Alkaline Phosphatase: 675 IU/L — ABNORMAL HIGH (ref 44–121)
BUN/Creatinine Ratio: 7 — ABNORMAL LOW (ref 9–23)
BUN: 37 mg/dL — ABNORMAL HIGH (ref 6–20)
Bilirubin Total: 0.5 mg/dL (ref 0.0–1.2)
CO2: 27 mmol/L (ref 20–29)
Calcium: 10.9 mg/dL — ABNORMAL HIGH (ref 8.7–10.2)
Chloride: 94 mmol/L — ABNORMAL LOW (ref 96–106)
Creatinine, Ser: 5.15 mg/dL — ABNORMAL HIGH (ref 0.57–1.00)
Globulin, Total: 2.6 g/dL (ref 1.5–4.5)
Glucose: 184 mg/dL — ABNORMAL HIGH (ref 70–99)
Potassium: 5 mmol/L (ref 3.5–5.2)
Sodium: 138 mmol/L (ref 134–144)
Total Protein: 7.2 g/dL (ref 6.0–8.5)
eGFR: 10 mL/min/{1.73_m2} — ABNORMAL LOW (ref >59)

## 2023-03-22 LAB — T4F: T4,Free (Direct): 1.53 ng/dL (ref 0.82–1.77)

## 2023-03-22 LAB — TSH RFX ON ABNORMAL TO FREE T4: TSH: 6.02 u[IU]/mL — ABNORMAL HIGH (ref 0.450–4.500)

## 2023-03-23 ENCOUNTER — Ambulatory Visit: Payer: Medicaid Other | Attending: Physician Assistant | Admitting: Physical Therapy

## 2023-03-23 ENCOUNTER — Encounter: Payer: Self-pay | Admitting: Internal Medicine

## 2023-03-23 ENCOUNTER — Encounter: Payer: Self-pay | Admitting: Physical Therapy

## 2023-03-23 VITALS — BP 147/84 | HR 80

## 2023-03-23 DIAGNOSIS — R2681 Unsteadiness on feet: Secondary | ICD-10-CM | POA: Insufficient documentation

## 2023-03-23 DIAGNOSIS — R2689 Other abnormalities of gait and mobility: Secondary | ICD-10-CM | POA: Diagnosis present

## 2023-03-23 DIAGNOSIS — M6281 Muscle weakness (generalized): Secondary | ICD-10-CM | POA: Insufficient documentation

## 2023-03-23 DIAGNOSIS — I69318 Other symptoms and signs involving cognitive functions following cerebral infarction: Secondary | ICD-10-CM | POA: Diagnosis present
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
## 2023-03-28 ENCOUNTER — Ambulatory Visit: Payer: Medicaid Other | Admitting: Physical Therapy

## 2023-03-28 ENCOUNTER — Encounter: Payer: Self-pay | Admitting: Physical Therapy

## 2023-03-28 VITALS — BP 131/76 | HR 74

## 2023-03-28 DIAGNOSIS — M6281 Muscle weakness (generalized): Secondary | ICD-10-CM | POA: Diagnosis not present

## 2023-03-28 DIAGNOSIS — R2689 Other abnormalities of gait and mobility: Secondary | ICD-10-CM

## 2023-03-28 DIAGNOSIS — I69318 Other symptoms and signs involving cognitive functions following cerebral infarction: Secondary | ICD-10-CM

## 2023-03-28 DIAGNOSIS — R2681 Unsteadiness on feet: Secondary | ICD-10-CM
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
## 2023-03-29 ENCOUNTER — Telehealth: Payer: Self-pay | Admitting: Internal Medicine

## 2023-03-29 ENCOUNTER — Encounter: Payer: Self-pay | Admitting: Internal Medicine

## 2023-03-29 DIAGNOSIS — R7989 Other specified abnormal findings of blood chemistry: Secondary | ICD-10-CM

## 2023-03-29 DIAGNOSIS — D696 Thrombocytopenia, unspecified: Secondary | ICD-10-CM

## 2023-03-29 NOTE — Telephone Encounter (Signed)
-----   Message from Anson Fret, MD sent at 03/29/2023 12:28 PM EDT ----- FYI Dr. Laural Benes her LFTs are very elevated and her platelets are down. I don;t know why so I have to throw this back to you so sorry!  Alkaline Phosphatase 44 - 121 IU/L 675 High     102 R   AST 0 - 40 IU/L 172 High     26 R   ALT 0 - 32 IU/L 176 High         Platelets 126  Doesn't look like they were this bad while inpatient?

## 2023-03-29 NOTE — Telephone Encounter (Signed)
Phone call placed to patient today to discuss lab results from the neurologist.  I called both her cell phone number on her mother's cell phone number.  I left messages on both stating who I am and my reason for calling and that I will send a message to her MyChart.

## 2023-03-30 ENCOUNTER — Ambulatory Visit: Payer: Medicaid Other | Attending: Internal Medicine

## 2023-03-30 ENCOUNTER — Other Ambulatory Visit: Payer: Self-pay | Admitting: Internal Medicine

## 2023-03-30 DIAGNOSIS — D696 Thrombocytopenia, unspecified: Secondary | ICD-10-CM

## 2023-03-30 DIAGNOSIS — R7989 Other specified abnormal findings of blood chemistry: Secondary | ICD-10-CM

## 2023-03-31 ENCOUNTER — Telehealth: Payer: Self-pay | Admitting: Internal Medicine

## 2023-03-31 ENCOUNTER — Other Ambulatory Visit: Payer: Self-pay | Admitting: Internal Medicine

## 2023-03-31 LAB — CBC
Hemoglobin: 9.4 g/dL — ABNORMAL LOW (ref 11.1–15.9)
Platelets: 174 10*3/uL (ref 150–450)

## 2023-03-31 LAB — HEPATITIS C ANTIBODY: Hep C Virus Ab: NONREACTIVE

## 2023-03-31 LAB — HEPATIC FUNCTION PANEL: Total Protein: 7 g/dL (ref 6.0–8.5)

## 2023-03-31 MED ORDER — LEVOTHYROXINE SODIUM 200 MCG PO TABS: 200.0000 ug | ORAL_TABLET | Freq: Every day | ORAL | 3 refills | Status: DC

## 2023-03-31 NOTE — Telephone Encounter (Signed)
error 

## 2023-03-31 NOTE — Telephone Encounter (Signed)
Phone call placed to patient today to go over lab results.  I left message on both phone numbers listed on her chart informing of who I am and that I will send results via MyChart.

## 2023-03-31 NOTE — Telephone Encounter (Signed)
Routing to PCP  Send back to [place] as I will be out of office.

## 2023-03-31 NOTE — Telephone Encounter (Signed)
Mother given most recent lab results. Mother asking if levothyroxine dose will be changed. States she thought dose was going to be increased.

## 2023-04-01 LAB — CBC
Hematocrit: 30 % — ABNORMAL LOW (ref 34.0–46.6)
MCH: 28 pg (ref 26.6–33.0)
MCHC: 31.3 g/dL — ABNORMAL LOW (ref 31.5–35.7)
MCV: 89 fL (ref 79–97)
RBC: 3.36 x10E6/uL — ABNORMAL LOW (ref 3.77–5.28)
RDW: 14.7 % (ref 11.7–15.4)
WBC: 7.4 10*3/uL (ref 3.4–10.8)

## 2023-04-01 LAB — HEPATITIS B SURFACE ANTIBODY, QUANTITATIVE: Hepatitis B Surf Ab Quant: 64.1 m[IU]/mL (ref >9.9)

## 2023-04-01 LAB — HEPATIC FUNCTION PANEL
ALT: 59 IU/L — ABNORMAL HIGH (ref 0–32)
AST: 37 IU/L (ref 0–40)
Albumin: 4.4 g/dL (ref 3.9–4.9)
Alkaline Phosphatase: 568 IU/L — ABNORMAL HIGH (ref 44–121)
Bilirubin Total: 0.4 mg/dL (ref 0.0–1.2)
Bilirubin, Direct: 0.17 mg/dL (ref 0.00–0.40)

## 2023-04-01 LAB — HEPATITIS B SURFACE ANTIGEN: Hepatitis B Surface Ag: NEGATIVE

## 2023-04-01 LAB — MITOCHONDRIAL ANTIBODIES: Mitochondrial Ab: <20 Units (ref 0.0–20.0)

## 2023-04-02 ENCOUNTER — Other Ambulatory Visit: Payer: Self-pay | Admitting: Internal Medicine

## 2023-04-02 DIAGNOSIS — N186 End stage renal disease: Secondary | ICD-10-CM

## 2023-04-02 MED ORDER — AMLODIPINE BESYLATE 10 MG PO TABS: 10.0000 mg | ORAL_TABLET | Freq: Every day | ORAL | 1 refills | Status: DC

## 2023-04-11 ENCOUNTER — Encounter: Payer: Self-pay | Admitting: Physical Medicine & Rehabilitation

## 2023-04-11 ENCOUNTER — Encounter: Payer: Medicaid Other | Attending: Registered Nurse | Admitting: Physical Medicine & Rehabilitation

## 2023-04-11 DIAGNOSIS — Z8673 Personal history of transient ischemic attack (TIA), and cerebral infarction without residual deficits: Secondary | ICD-10-CM | POA: Insufficient documentation
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
## 2023-04-11 NOTE — Patient Instructions (Signed)
No driving until cleared by Neurologist

## 2023-04-12 ENCOUNTER — Other Ambulatory Visit: Payer: Self-pay | Admitting: Internal Medicine

## 2023-04-12 ENCOUNTER — Telehealth: Payer: Self-pay | Admitting: Internal Medicine

## 2023-04-12 ENCOUNTER — Encounter: Payer: Self-pay | Admitting: Internal Medicine

## 2023-04-12 DIAGNOSIS — E039 Hypothyroidism, unspecified: Secondary | ICD-10-CM

## 2023-04-12 NOTE — Telephone Encounter (Signed)
-----   Message from Dionne Bucy sent at 04/05/2023 12:40 PM EDT ----- Regarding: Nephrology Referral Good Afternoon   [facility]   Declined  the referral    Rejection Reason - Other - we do not see patients on dialysis in the office they are seen at the dialysis center." Vianne Bulls said on Apr 05, 2023 10:01 AM

## 2023-04-12 NOTE — Telephone Encounter (Signed)
Pt sent Mychart message with nephrology response.

## 2023-04-24 ENCOUNTER — Telehealth: Payer: Self-pay | Admitting: Gastroenterology

## 2023-04-24 NOTE — Telephone Encounter (Signed)
Good morning Dr. Chales Abrahams  The following patient was referred to [place] for Abnormal LFTs. She had a stroke recently and it is better for her to get to a doctor here in [place] rather than going to [place]. Her records are available on Epic. Please review and advise of scheduling. Thank you.

## 2023-04-25 ENCOUNTER — Ambulatory Visit: Payer: Medicaid Other | Attending: Internal Medicine

## 2023-04-25 DIAGNOSIS — E039 Hypothyroidism, unspecified: Secondary | ICD-10-CM

## 2023-04-25 NOTE — Telephone Encounter (Signed)
I would suggest continue her care with digestive health. RG

## 2023-04-26 LAB — TSH: TSH: 1.09 u[IU]/mL (ref 0.450–4.500)

## 2023-04-27 ENCOUNTER — Other Ambulatory Visit: Payer: Medicaid Other

## 2023-04-28 ENCOUNTER — Emergency Department (HOSPITAL_COMMUNITY): Payer: Medicaid Other

## 2023-04-28 ENCOUNTER — Emergency Department (HOSPITAL_COMMUNITY)
Admission: EM | Admit: 2023-04-28 | Discharge: 2023-04-29 | Disposition: A | Discharge status: Home / Self Care | Payer: Medicaid Other | Attending: Emergency Medicine | Admitting: Emergency Medicine

## 2023-04-28 ENCOUNTER — Encounter (HOSPITAL_COMMUNITY): Payer: Self-pay

## 2023-04-28 DIAGNOSIS — Z79899 Other long term (current) drug therapy: Secondary | ICD-10-CM | POA: Diagnosis not present

## 2023-04-28 DIAGNOSIS — I120 Hypertensive chronic kidney disease with stage 5 chronic kidney disease or end stage renal disease: Secondary | ICD-10-CM | POA: Diagnosis not present

## 2023-04-28 DIAGNOSIS — N186 End stage renal disease: Secondary | ICD-10-CM | POA: Diagnosis not present

## 2023-04-28 DIAGNOSIS — Y92009 Unspecified place in unspecified non-institutional (private) residence as the place of occurrence of the external cause: Secondary | ICD-10-CM | POA: Insufficient documentation

## 2023-04-28 DIAGNOSIS — M25551 Pain in right hip: Secondary | ICD-10-CM

## 2023-04-28 DIAGNOSIS — E1165 Type 2 diabetes mellitus with hyperglycemia: Secondary | ICD-10-CM | POA: Insufficient documentation

## 2023-04-28 DIAGNOSIS — E875 Hyperkalemia: Secondary | ICD-10-CM

## 2023-04-28 DIAGNOSIS — E1122 Type 2 diabetes mellitus with diabetic chronic kidney disease: Secondary | ICD-10-CM | POA: Insufficient documentation

## 2023-04-28 DIAGNOSIS — Z992 Dependence on renal dialysis: Secondary | ICD-10-CM | POA: Diagnosis not present

## 2023-04-28 DIAGNOSIS — Z794 Long term (current) use of insulin: Secondary | ICD-10-CM | POA: Insufficient documentation

## 2023-04-28 DIAGNOSIS — W51XXXA Accidental striking against or bumped into by another person, initial encounter: Secondary | ICD-10-CM | POA: Insufficient documentation

## 2023-04-28 DIAGNOSIS — R739 Hyperglycemia, unspecified: Secondary | ICD-10-CM

## 2023-04-28 LAB — CBG MONITORING, ED
Glucose-Capillary: 422 mg/dL — ABNORMAL HIGH (ref 70–99)
Glucose-Capillary: 359 mg/dL — ABNORMAL HIGH (ref 70–99)

## 2023-04-28 LAB — CBC
HCT: 34 % — ABNORMAL LOW (ref 36.0–46.0)
Hemoglobin: 10.5 g/dL — ABNORMAL LOW (ref 12.0–15.0)
MCH: 26.6 pg (ref 26.0–34.0)
MCHC: 30.9 g/dL (ref 30.0–36.0)
MCV: 86.1 fL (ref 80.0–100.0)
Platelets: 162 10*3/uL (ref 150–400)
RBC: 3.95 MIL/uL (ref 3.87–5.11)
RDW: 16.6 % — ABNORMAL HIGH (ref 11.5–15.5)
WBC: 7.4 10*3/uL (ref 4.0–10.5)
nRBC: 0 % (ref 0.0–0.2)

## 2023-04-28 LAB — HEPATITIS B SURFACE ANTIGEN: Hepatitis B Surface Ag: NONREACTIVE

## 2023-04-28 LAB — COMPREHENSIVE METABOLIC PANEL
ALT: 100 U/L — ABNORMAL HIGH (ref 0–44)
AST: 45 U/L — ABNORMAL HIGH (ref 15–41)
Albumin: 4 g/dL (ref 3.5–5.0)
Alkaline Phosphatase: 388 U/L — ABNORMAL HIGH (ref 38–126)
Anion gap: 19 — ABNORMAL HIGH (ref 5–15)
BUN: 82 mg/dL — ABNORMAL HIGH (ref 6–20)
CO2: 19 mmol/L — ABNORMAL LOW (ref 22–32)
Calcium: 10.2 mg/dL (ref 8.9–10.3)
Chloride: 89 mmol/L — ABNORMAL LOW (ref 98–111)
Creatinine, Ser: 10.68 mg/dL — ABNORMAL HIGH (ref 0.44–1.00)
GFR, Estimated: 4 mL/min — ABNORMAL LOW (ref >60)
Glucose, Bld: 407 mg/dL — ABNORMAL HIGH (ref 70–99)
Potassium: 7.2 mmol/L (ref 3.5–5.1)
Sodium: 127 mmol/L — ABNORMAL LOW (ref 135–145)
Total Bilirubin: 1.2 mg/dL (ref 0.3–1.2)
Total Protein: 7.5 g/dL (ref 6.5–8.1)

## 2023-04-28 MED ORDER — PENTAFLUOROPROP-TETRAFLUOROETH EX AERO: 1.0000 | INHALATION_SPRAY | CUTANEOUS | Status: DC | PRN

## 2023-04-28 MED ORDER — LIDOCAINE 5 % EX PTCH
1.0000 | MEDICATED_PATCH | Freq: Once | CUTANEOUS | Status: AC
  Administered 2023-04-28: 2 via TRANSDERMAL
  Filled 2023-04-28: qty 2

## 2023-04-28 MED ORDER — ALBUTEROL SULFATE (2.5 MG/3ML) 0.083% IN NEBU
15.0000 mg | INHALATION_SOLUTION | Freq: Once | RESPIRATORY_TRACT | Status: AC
  Filled 2023-04-28 (×2): qty 18

## 2023-04-28 MED ORDER — SODIUM ZIRCONIUM CYCLOSILICATE 10 G PO PACK
10.0000 g | PACK | Freq: Once | ORAL | Status: AC
  Administered 2023-04-28: 10 g via ORAL
  Filled 2023-04-28: qty 1

## 2023-04-28 MED ORDER — CHLORHEXIDINE GLUCONATE CLOTH 2 % EX PADS: 6.0000 | MEDICATED_PAD | Freq: Every day | CUTANEOUS | Status: DC

## 2023-04-28 MED ORDER — ACETAMINOPHEN 500 MG PO TABS
1000.0000 mg | ORAL_TABLET | Freq: Once | ORAL | Status: AC
  Administered 2023-04-28: 1000 mg via ORAL
  Filled 2023-04-28: qty 2

## 2023-04-28 MED ORDER — INSULIN ASPART 100 UNIT/ML IV SOLN
10.0000 [IU] | Freq: Once | INTRAVENOUS | Status: AC
  Administered 2023-04-28: 10 [IU] via INTRAVENOUS

## 2023-04-28 MED ORDER — CALCIUM GLUCONATE-NACL 1-0.675 GM/50ML-% IV SOLN
1.0000 g | Freq: Once | INTRAVENOUS | Status: AC
  Administered 2023-04-28: 1000 mg via INTRAVENOUS
  Filled 2023-04-28: qty 50

## 2023-04-28 MED ORDER — ALBUTEROL SULFATE (2.5 MG/3ML) 0.083% IN NEBU
INHALATION_SOLUTION | RESPIRATORY_TRACT | Status: AC
  Administered 2023-04-28: 15 mg via RESPIRATORY_TRACT
  Filled 2023-04-28: qty 15

## 2023-04-28 MED ORDER — LIDOCAINE-PRILOCAINE 2.5-2.5 % EX CREA: 1.0000 | TOPICAL_CREAM | CUTANEOUS | Status: DC | PRN

## 2023-04-28 MED ORDER — LIDOCAINE HCL (PF) 1 % IJ SOLN: 5.0000 mL | INTRAMUSCULAR | Status: DC | PRN

## 2023-04-28 NOTE — ED Provider Notes (Signed)
  Physical Exam  BP (!) 145/73 (BP Location: Right Arm)   Pulse 91   Temp 98.7 F (37.1 C) (Oral)   Resp 15   Ht 5\' 4"  (1.626 m)   Wt 59 kg   SpO2 96%   BMI 22.31 kg/m   Physical Exam  Procedures  Procedures  ED Course / MDM   Clinical Course as of 04/29/23 0116  Fri Apr 28, 2023  1528 Mild degenerative changes on CXR without acute traumatic injury. [VK]  1554 Potassium 7 on CMP, EKG is pending, nursing notified. Nephrology will be consulted. [VK]  1605 Patient signed out to Dr. Jarold Motto pending EKG and nephrology eval for HD. [VK]  1620 Assumed care.  37 year old female with a history of ESRD on Monday, Wednesday, and Friday IHD.  Who presented after missing dialysis and was found to have a potassium of 7.2.  EKG shows peaked T waves with prolonged PR interval but normal QRS.  Patient giving calcium and insulin and Lokelma.  Is noted to be be hyper glycemic at 400.  X-rays without fractures.  On exam is in no acute distress and is not hypoxic.  X-ray of the right hip unremarkable.  Gets her dialysis at [facility].  Nephrology paged by off going provider.  Will also give albuterol at this time. [RP]  1700 Dr Juel Burrow from nephrology is aware and will arrange for the patient to get dialysis. [RP]  1839 Patient being taken to dialysis.  [RP]  Sat Apr 29, 2023  0112 Potassium: 4.0 Patient has returned from dialysis potassium value now.  Patient satting well on room air.  Her blood sugar is 300 but does not have significant acidosis.  States that she will go to dialysis on Monday or return to the emergency department.  Will also have her follow-up with her primary doctor in several days. [RP]    Clinical Course User Index [RP] Rondel Baton, MD [VK] Rexford Maus, DO   Medical Decision Making Amount and/or Complexity of Data Reviewed Labs: ordered. Decision-making details documented in ED Course. Radiology: ordered.  Risk OTC drugs. Prescription drug management.      Rondel Baton, MD 04/29/23 [phone]
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
## 2023-04-28 NOTE — ED Triage Notes (Addendum)
Pt reports right leg pain and right lower back pain since yesterday. Denies injury. Pt reports she missed dialysis today due to the pain. Pt did complete dialysis Wednesday. PT AxOx4. Pt denies any other symptoms.

## 2023-04-29 LAB — BASIC METABOLIC PANEL
Anion gap: 20 — ABNORMAL HIGH (ref 5–15)
BUN: 37 mg/dL — ABNORMAL HIGH (ref 6–20)
CO2: 22 mmol/L (ref 22–32)
Calcium: 9.9 mg/dL (ref 8.9–10.3)
Chloride: 90 mmol/L — ABNORMAL LOW (ref 98–111)
Creatinine, Ser: 5.72 mg/dL — ABNORMAL HIGH (ref 0.44–1.00)
GFR, Estimated: 9 mL/min — ABNORMAL LOW (ref >60)
Glucose, Bld: 330 mg/dL — ABNORMAL HIGH (ref 70–99)
Potassium: 4 mmol/L (ref 3.5–5.1)
Sodium: 132 mmol/L — ABNORMAL LOW (ref 135–145)

## 2023-04-29 LAB — HEPATITIS B SURFACE ANTIBODY, QUANTITATIVE: Hep B S AB Quant (Post): 92.2 m[IU]/mL (ref >9.9)

## 2023-04-29 NOTE — Discharge Instructions (Signed)
You were seen for your elevated potassium in the emergency department.   At home, please make sure to take any medications you have been prescribed including your insulin.    Check your MyChart online for the results of any tests that had not resulted by the time you left the emergency department.   Follow-up with your primary doctor in 2-3 days regarding your visit.    Return immediately to the emergency department if you experience any of the following: Difficulty breathing, severe muscle cramps, or any other concerning symptoms.    Thank you for visiting our Emergency Department. It was a pleasure taking care of you today.

## 2023-05-04 ENCOUNTER — Emergency Department (HOSPITAL_COMMUNITY): Payer: Medicaid Other

## 2023-05-04 ENCOUNTER — Inpatient Hospital Stay (HOSPITAL_COMMUNITY)
Admission: EM | Admit: 2023-05-04 | Discharge: 2023-05-07 | DRG: 637 | Disposition: A | Discharge status: Home / Self Care | Payer: Medicaid Other | Attending: Internal Medicine | Admitting: Internal Medicine

## 2023-05-04 ENCOUNTER — Other Ambulatory Visit: Payer: Self-pay

## 2023-05-04 ENCOUNTER — Telehealth: Payer: Self-pay

## 2023-05-04 ENCOUNTER — Encounter (HOSPITAL_COMMUNITY): Payer: Self-pay | Admitting: Family Medicine

## 2023-05-04 DIAGNOSIS — K219 Gastro-esophageal reflux disease without esophagitis: Secondary | ICD-10-CM | POA: Diagnosis present

## 2023-05-04 DIAGNOSIS — E1100 Type 2 diabetes mellitus with hyperosmolarity without nonketotic hyperglycemic-hyperosmolar coma (NKHHC): Secondary | ICD-10-CM | POA: Diagnosis not present

## 2023-05-04 DIAGNOSIS — R739 Hyperglycemia, unspecified: Principal | ICD-10-CM

## 2023-05-04 DIAGNOSIS — I120 Hypertensive chronic kidney disease with stage 5 chronic kidney disease or end stage renal disease: Secondary | ICD-10-CM | POA: Diagnosis present

## 2023-05-04 DIAGNOSIS — Z8249 Family history of ischemic heart disease and other diseases of the circulatory system: Secondary | ICD-10-CM

## 2023-05-04 DIAGNOSIS — N186 End stage renal disease: Secondary | ICD-10-CM | POA: Diagnosis present

## 2023-05-04 DIAGNOSIS — I10 Essential (primary) hypertension: Secondary | ICD-10-CM | POA: Diagnosis not present

## 2023-05-04 DIAGNOSIS — R4182 Altered mental status, unspecified: Secondary | ICD-10-CM | POA: Diagnosis not present

## 2023-05-04 DIAGNOSIS — Z91158 Patient's noncompliance with renal dialysis for other reason: Secondary | ICD-10-CM

## 2023-05-04 DIAGNOSIS — Z833 Family history of diabetes mellitus: Secondary | ICD-10-CM | POA: Diagnosis not present

## 2023-05-04 DIAGNOSIS — E1159 Type 2 diabetes mellitus with other circulatory complications: Secondary | ICD-10-CM | POA: Diagnosis present

## 2023-05-04 DIAGNOSIS — Z820 Family history of epilepsy and other diseases of the nervous system: Secondary | ICD-10-CM | POA: Diagnosis not present

## 2023-05-04 DIAGNOSIS — E785 Hyperlipidemia, unspecified: Secondary | ICD-10-CM | POA: Diagnosis present

## 2023-05-04 DIAGNOSIS — E1122 Type 2 diabetes mellitus with diabetic chronic kidney disease: Secondary | ICD-10-CM | POA: Diagnosis present

## 2023-05-04 DIAGNOSIS — Z823 Family history of stroke: Secondary | ICD-10-CM | POA: Diagnosis not present

## 2023-05-04 DIAGNOSIS — Z79899 Other long term (current) drug therapy: Secondary | ICD-10-CM

## 2023-05-04 DIAGNOSIS — Z803 Family history of malignant neoplasm of breast: Secondary | ICD-10-CM | POA: Diagnosis not present

## 2023-05-04 DIAGNOSIS — Z881 Allergy status to other antibiotic agents status: Secondary | ICD-10-CM

## 2023-05-04 DIAGNOSIS — G9341 Metabolic encephalopathy: Secondary | ICD-10-CM | POA: Diagnosis present

## 2023-05-04 DIAGNOSIS — G40909 Epilepsy, unspecified, not intractable, without status epilepticus: Secondary | ICD-10-CM | POA: Diagnosis present

## 2023-05-04 DIAGNOSIS — Z79890 Hormone replacement therapy (postmenopausal): Secondary | ICD-10-CM

## 2023-05-04 DIAGNOSIS — E1142 Type 2 diabetes mellitus with diabetic polyneuropathy: Secondary | ICD-10-CM | POA: Diagnosis present

## 2023-05-04 DIAGNOSIS — Z87891 Personal history of nicotine dependence: Secondary | ICD-10-CM

## 2023-05-04 DIAGNOSIS — Z8673 Personal history of transient ischemic attack (TIA), and cerebral infarction without residual deficits: Secondary | ICD-10-CM | POA: Diagnosis not present

## 2023-05-04 DIAGNOSIS — Z992 Dependence on renal dialysis: Secondary | ICD-10-CM | POA: Diagnosis not present

## 2023-05-04 DIAGNOSIS — J45909 Unspecified asthma, uncomplicated: Secondary | ICD-10-CM | POA: Diagnosis present

## 2023-05-04 DIAGNOSIS — E113593 Type 2 diabetes mellitus with proliferative diabetic retinopathy without macular edema, bilateral: Secondary | ICD-10-CM | POA: Diagnosis present

## 2023-05-04 DIAGNOSIS — G9340 Encephalopathy, unspecified: Secondary | ICD-10-CM | POA: Diagnosis present

## 2023-05-04 DIAGNOSIS — E039 Hypothyroidism, unspecified: Secondary | ICD-10-CM | POA: Diagnosis present

## 2023-05-04 DIAGNOSIS — R569 Unspecified convulsions: Secondary | ICD-10-CM | POA: Diagnosis not present

## 2023-05-04 DIAGNOSIS — D631 Anemia in chronic kidney disease: Secondary | ICD-10-CM | POA: Diagnosis present

## 2023-05-04 LAB — BASIC METABOLIC PANEL
Anion gap: 19 — ABNORMAL HIGH (ref 5–15)
Anion gap: 19 — ABNORMAL HIGH (ref 5–15)
Anion gap: 16 — ABNORMAL HIGH (ref 5–15)
Anion gap: 17 — ABNORMAL HIGH (ref 5–15)
Anion gap: 14 (ref 5–15)
BUN: 69 mg/dL — ABNORMAL HIGH (ref 6–20)
BUN: 67 mg/dL — ABNORMAL HIGH (ref 6–20)
BUN: 68 mg/dL — ABNORMAL HIGH (ref 6–20)
BUN: 70 mg/dL — ABNORMAL HIGH (ref 6–20)
BUN: 67 mg/dL — ABNORMAL HIGH (ref 6–20)
CO2: 22 mmol/L (ref 22–32)
CO2: 21 mmol/L — ABNORMAL LOW (ref 22–32)
CO2: 24 mmol/L (ref 22–32)
CO2: 25 mmol/L (ref 22–32)
CO2: 24 mmol/L (ref 22–32)
Calcium: 9.6 mg/dL (ref 8.9–10.3)
Calcium: 9.9 mg/dL (ref 8.9–10.3)
Calcium: 10.1 mg/dL (ref 8.9–10.3)
Calcium: 9.8 mg/dL (ref 8.9–10.3)
Calcium: 9.5 mg/dL (ref 8.9–10.3)
Chloride: 83 mmol/L — ABNORMAL LOW (ref 98–111)
Chloride: 88 mmol/L — ABNORMAL LOW (ref 98–111)
Chloride: 90 mmol/L — ABNORMAL LOW (ref 98–111)
Chloride: 88 mmol/L — ABNORMAL LOW (ref 98–111)
Chloride: 89 mmol/L — ABNORMAL LOW (ref 98–111)
Creatinine, Ser: 10.17 mg/dL — ABNORMAL HIGH (ref 0.44–1.00)
Creatinine, Ser: 10.19 mg/dL — ABNORMAL HIGH (ref 0.44–1.00)
Creatinine, Ser: 10.14 mg/dL — ABNORMAL HIGH (ref 0.44–1.00)
Creatinine, Ser: 10.54 mg/dL — ABNORMAL HIGH (ref 0.44–1.00)
Creatinine, Ser: 10.55 mg/dL — ABNORMAL HIGH (ref 0.44–1.00)
GFR, Estimated: 5 mL/min — ABNORMAL LOW (ref >60)
GFR, Estimated: 5 mL/min — ABNORMAL LOW (ref >60)
GFR, Estimated: 5 mL/min — ABNORMAL LOW (ref >60)
GFR, Estimated: 4 mL/min — ABNORMAL LOW (ref >60)
GFR, Estimated: 4 mL/min — ABNORMAL LOW (ref >60)
Glucose, Bld: 710 mg/dL (ref 70–99)
Glucose, Bld: 278 mg/dL — ABNORMAL HIGH (ref 70–99)
Glucose, Bld: 128 mg/dL — ABNORMAL HIGH (ref 70–99)
Glucose, Bld: 104 mg/dL — ABNORMAL HIGH (ref 70–99)
Glucose, Bld: 167 mg/dL — ABNORMAL HIGH (ref 70–99)
Potassium: 4.7 mmol/L (ref 3.5–5.1)
Potassium: 4.2 mmol/L (ref 3.5–5.1)
Potassium: 4.7 mmol/L (ref 3.5–5.1)
Potassium: 4.6 mmol/L (ref 3.5–5.1)
Potassium: 4.4 mmol/L (ref 3.5–5.1)
Sodium: 124 mmol/L — ABNORMAL LOW (ref 135–145)
Sodium: 128 mmol/L — ABNORMAL LOW (ref 135–145)
Sodium: 130 mmol/L — ABNORMAL LOW (ref 135–145)
Sodium: 130 mmol/L — ABNORMAL LOW (ref 135–145)
Sodium: 127 mmol/L — ABNORMAL LOW (ref 135–145)

## 2023-05-04 LAB — GLUCOSE, CAPILLARY
Glucose-Capillary: 116 mg/dL — ABNORMAL HIGH (ref 70–99)
Glucose-Capillary: 104 mg/dL — ABNORMAL HIGH (ref 70–99)
Glucose-Capillary: 138 mg/dL — ABNORMAL HIGH (ref 70–99)
Glucose-Capillary: 132 mg/dL — ABNORMAL HIGH (ref 70–99)
Glucose-Capillary: 159 mg/dL — ABNORMAL HIGH (ref 70–99)
Glucose-Capillary: 198 mg/dL — ABNORMAL HIGH (ref 70–99)
Glucose-Capillary: 192 mg/dL — ABNORMAL HIGH (ref 70–99)
Glucose-Capillary: 153 mg/dL — ABNORMAL HIGH (ref 70–99)
Glucose-Capillary: 123 mg/dL — ABNORMAL HIGH (ref 70–99)

## 2023-05-04 LAB — OSMOLALITY: Osmolality: 312 mOsm/kg — ABNORMAL HIGH (ref 275–295)

## 2023-05-04 LAB — BETA-HYDROXYBUTYRIC ACID: Beta-Hydroxybutyric Acid: 0.32 mmol/L — ABNORMAL HIGH (ref 0.05–0.27)

## 2023-05-04 LAB — CBC WITH DIFFERENTIAL/PLATELET
Abs Immature Granulocytes: 0.04 10*3/uL (ref 0.00–0.07)
Basophils Absolute: 0 10*3/uL (ref 0.0–0.1)
Basophils Relative: 0 %
Eosinophils Absolute: 0 10*3/uL (ref 0.0–0.5)
Eosinophils Relative: 1 %
HCT: 30.8 % — ABNORMAL LOW (ref 36.0–46.0)
Hemoglobin: 9.9 g/dL — ABNORMAL LOW (ref 12.0–15.0)
Immature Granulocytes: 1 %
Lymphocytes Relative: 11 %
Lymphs Abs: 0.9 10*3/uL (ref 0.7–4.0)
MCH: 26.6 pg (ref 26.0–34.0)
MCHC: 32.1 g/dL (ref 30.0–36.0)
MCV: 82.8 fL (ref 80.0–100.0)
Monocytes Absolute: 0.8 10*3/uL (ref 0.1–1.0)
Monocytes Relative: 10 %
Neutro Abs: 6.5 10*3/uL (ref 1.7–7.7)
Neutrophils Relative %: 77 %
Platelets: 139 10*3/uL — ABNORMAL LOW (ref 150–400)
RBC: 3.72 MIL/uL — ABNORMAL LOW (ref 3.87–5.11)
RDW: 16.4 % — ABNORMAL HIGH (ref 11.5–15.5)
WBC: 8.3 10*3/uL (ref 4.0–10.5)
nRBC: 0 % (ref 0.0–0.2)

## 2023-05-04 LAB — CBG MONITORING, ED
Glucose-Capillary: 121 mg/dL — ABNORMAL HIGH (ref 70–99)
Glucose-Capillary: 127 mg/dL — ABNORMAL HIGH (ref 70–99)
Glucose-Capillary: 152 mg/dL — ABNORMAL HIGH (ref 70–99)
Glucose-Capillary: 167 mg/dL — ABNORMAL HIGH (ref 70–99)
Glucose-Capillary: 225 mg/dL — ABNORMAL HIGH (ref 70–99)
Glucose-Capillary: 289 mg/dL — ABNORMAL HIGH (ref 70–99)
Glucose-Capillary: 386 mg/dL — ABNORMAL HIGH (ref 70–99)
Glucose-Capillary: 407 mg/dL — ABNORMAL HIGH (ref 70–99)
Glucose-Capillary: 473 mg/dL — ABNORMAL HIGH (ref 70–99)
Glucose-Capillary: 518 mg/dL (ref 70–99)
Glucose-Capillary: 599 mg/dL (ref 70–99)
Glucose-Capillary: >600 mg/dL (ref 70–99)

## 2023-05-04 LAB — I-STAT VENOUS BLOOD GAS, ED
Acid-Base Excess: 1 mmol/L (ref 0.0–2.0)
Bicarbonate: 26.1 mmol/L (ref 20.0–28.0)
Calcium, Ion: 1.17 mmol/L (ref 1.15–1.40)
HCT: 38 % (ref 36.0–46.0)
Hemoglobin: 12.9 g/dL (ref 12.0–15.0)
O2 Saturation: 80 %
Potassium: 4.9 mmol/L (ref 3.5–5.1)
Sodium: 124 mmol/L — ABNORMAL LOW (ref 135–145)
TCO2: 27 mmol/L (ref 22–32)
pCO2, Ven: 43.3 mmHg — ABNORMAL LOW (ref 44–60)
pH, Ven: 7.389 (ref 7.25–7.43)
pO2, Ven: 45 mmHg (ref 32–45)

## 2023-05-04 LAB — I-STAT BETA HCG BLOOD, ED (MC, WL, AP ONLY): I-stat hCG, quantitative: <5 m[IU]/mL (ref <5)

## 2023-05-04 LAB — TSH: TSH: 1.356 u[IU]/mL (ref 0.350–4.500)

## 2023-05-04 LAB — LACTIC ACID, PLASMA: Lactic Acid, Venous: 1.8 mmol/L (ref 0.5–1.9)

## 2023-05-04 MED ORDER — DEXTROSE IN LACTATED RINGERS 5 % IV SOLN: INTRAVENOUS | Status: DC

## 2023-05-04 MED ORDER — LACTATED RINGERS IV SOLN: INTRAVENOUS | Status: DC

## 2023-05-04 MED ORDER — DEXTROSE 50 % IV SOLN: 0.0000 mL | INTRAVENOUS | Status: DC | PRN

## 2023-05-04 MED ORDER — DEXTROSE 5 % IV SOLN: INTRAVENOUS | Status: DC

## 2023-05-04 MED ORDER — INSULIN REGULAR(HUMAN) IN NACL 100-0.9 UT/100ML-% IV SOLN
INTRAVENOUS | Status: DC
  Administered 2023-05-04: 0.4 [IU]/h via INTRAVENOUS
  Administered 2023-05-04: 4.2 [IU]/h via INTRAVENOUS
  Filled 2023-05-04: qty 100

## 2023-05-04 MED ORDER — LACTATED RINGERS IV BOLUS
20.0000 mL/kg | Freq: Once | INTRAVENOUS | Status: AC
  Administered 2023-05-04: 1180 mL via INTRAVENOUS

## 2023-05-04 MED ORDER — CHLORHEXIDINE GLUCONATE CLOTH 2 % EX PADS
6.0000 | MEDICATED_PAD | Freq: Every day | CUTANEOUS | Status: DC
  Administered 2023-05-05 – 2023-05-07 (×3): 6 via TOPICAL

## 2023-05-04 MED ORDER — LACTATED RINGERS IV BOLUS: 20.0000 mL/kg | Freq: Once | INTRAVENOUS | Status: DC

## 2023-05-04 MED ORDER — LEVETIRACETAM IN NACL 500 MG/100ML IV SOLN
500.0000 mg | Freq: Two times a day (BID) | INTRAVENOUS | Status: DC
  Administered 2023-05-04 – 2023-05-05 (×3): 500 mg via INTRAVENOUS
  Filled 2023-05-04 (×3): qty 100

## 2023-05-04 MED ORDER — POTASSIUM CHLORIDE 10 MEQ/100ML IV SOLN
10.0000 meq | INTRAVENOUS | Status: AC
  Administered 2023-05-04 (×2): 10 meq via INTRAVENOUS
  Filled 2023-05-04 (×2): qty 100

## 2023-05-04 MED ORDER — SODIUM CHLORIDE 0.9 % IV SOLN
2000.0000 mg | Freq: Once | INTRAVENOUS | Status: AC
  Administered 2023-05-04: 2000 mg via INTRAVENOUS
  Filled 2023-05-04: qty 20

## 2023-05-04 MED ORDER — HEPARIN SODIUM (PORCINE) 5000 UNIT/ML IJ SOLN
5000.0000 [IU] | Freq: Three times a day (TID) | INTRAMUSCULAR | Status: DC
  Administered 2023-05-04 – 2023-05-06 (×8): 5000 [IU] via SUBCUTANEOUS
  Filled 2023-05-04 (×9): qty 1

## 2023-05-04 MED ORDER — LABETALOL HCL 5 MG/ML IV SOLN: 10.0000 mg | INTRAVENOUS | Status: DC | PRN

## 2023-05-04 MED ORDER — INSULIN REGULAR(HUMAN) IN NACL 100-0.9 UT/100ML-% IV SOLN
INTRAVENOUS | Status: DC
  Administered 2023-05-04: 5.5 [IU]/h via INTRAVENOUS
  Filled 2023-05-04: qty 100

## 2023-05-04 NOTE — Progress Notes (Signed)
EEG complete - results pending 

## 2023-05-04 NOTE — ED Triage Notes (Signed)
Patient BIB EMS for evaluation of hyperglycemia and unresponsive.  Per reports, patient lives at home with mother.  Mother was out of town for a day.  Another family member was suppose to be checking in on patient.  Was scheduled for dialysis today.  Unknown if patient received treatment.  When mother arrived home tonight she found the patient unresponsive on the floor.  CBG read high and mother administered "23 units of Insulin."  Patient will currently open eyes to voice.  Oriented to self, place, and time.  Unable to recall events from today/earlier

## 2023-05-04 NOTE — Procedures (Signed)
Patient Name: Angela [name]  MRN: [id number]  Epilepsy Attending: Charlsie Quest  Referring Physician/Provider: Caryl Pina, MD  Date: 05/04/2023 Duration: 26.01 mins  Patient history:  37 y.o. female who presents to the emergency department after she was found unresponsive. EEG to evaluate for seizure.  Level of alertness:  lethargic   AEDs during EEG study: LEV  Technical aspects: This EEG study was done with scalp electrodes positioned according to the 10-20 International system of electrode placement. Electrical activity was reviewed with band pass filter of 1-70Hz , sensitivity of 7 uV/mm, display speed of 26mm/sec with a 60Hz  notched filter applied as appropriate. EEG data were recorded continuously and digitally stored.  Video monitoring was available and reviewed as appropriate.  Description: EEG showed continuous generalized 3 to 6 Hz theta-delta slowing. Physiologic photic driving was not seen during photic stimulation.  Hyperventilation was not performed.     ABNORMALITY - Continuous slow, generalized  IMPRESSION: This study is suggestive of moderate diffuse encephalopathy, nonspecific etiology. No seizures or epileptiform discharges were seen throughout the recording.  Angela [name]

## 2023-05-04 NOTE — Inpatient Diabetes Management (Signed)
Inpatient Diabetes Program Recommendations  AACE/ADA: New Consensus Statement on Inpatient Glycemic Control (2015)  Target Ranges:  Prepandial:   less than 140 mg/dL      Peak postprandial:   less than 180 mg/dL (1-2 hours)      Critically ill patients:  140 - 180 mg/dL   Lab Results  Component Value Date   GLUCAP 225 (H) 05/04/2023   HGBA1C 12.0 (H) 01/28/2023    Review of Glycemic Control  Latest Reference Range & Units 05/04/23 00:26 05/04/23 03:54 05/04/23 04:33 05/04/23 05:36 05/04/23 06:22 05/04/23 07:14 05/04/23 08:28 05/04/23 09:35  Glucose-Capillary 70 - 99 mg/dL >960 (HH) 454 (HH) 098 (HH) 473 (H) 407 (H) 386 (H) 289 (H) 225 (H)   Diabetes history: DM 1 (very sensitive to insulin) Outpatient Diabetes medications:  Dexcom G6 Novolog 2-3 units tid with meals Tresiba 5 units bid Current orders for Inpatient glycemic control:  IV insulin-  Inpatient Diabetes Program Recommendations:    Note patient has history of Type 1 DM.  She is insulin dependent and very sensitive to insulin doses.   Recommend continuing insulin drip until acidosis cleared.  Will need conservative insulin dosing to prevent low blood sugars after transition.    -Consider Semglee 8 units daily (2 hours prior to d/c of insulin drip), Novolog very sensitive correction and HS, and 2 units of meal coverage upon transition off insulin drip.   Thanks,  Beryl Meager, RN, BC-ADM Inpatient Diabetes Coordinator Pager [phone]  (8a-5p)
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
## 2023-05-04 NOTE — Progress Notes (Signed)
Preliminary review of EEG reveals EKG artifact at Fp1-F3 and F3-C3. There is diffuse slowing. No electorgraphic seizures seen.   Electronically signed: Dr. Caryl Pina
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
## 2023-05-04 NOTE — Telephone Encounter (Signed)
Call placed to patient unable to reach message left on VM.   

## 2023-05-04 NOTE — Progress Notes (Addendum)
  PROGRESS NOTE  Patient admitted earlier this morning. See H&P.   Angela [name] is a 37 y.o. female with medical history significant for poorly controlled insulin-dependent diabetes mellitus type 1, hypertension, history of hemorrhagic stroke, hypothyroidism, and ESRD on hemodialysis MWF who presents to the emergency department after she was found unresponsive.   Patient lives with her mother, who was out of town for a day.  When her mother returned last night, the patient was found on the floor unresponsive.  She was noted to have CBG reading of "high" and insulin was administered by family.  EMS was called and she was brought into the ED.  On presentation to the emergency department, patient's blood glucose was found to be 710.  She was started on IV insulin drip.  She had missed her dialysis 5/15.  Nephrology consulted for dialysis.  CT head without acute findings.  DKA -IV insulin protocol -DM coordinator   Metabolic encephalopathy -Acute CT head without acute finding -Per mom, patient is intermittently lethargic at home following her stroke earlier this year.  Has some left-sided deficits.  ESRD -Nephrology for HD  Seizure disorder -Keppra   Status is: Inpatient Remains inpatient appropriate because: IV insulin    Noralee Stain, DO [facility] 05/04/2023, 1:57 PM  Available via Epic secure chat 7am-7pm After these hours, please refer to coverage provider listed on [email or web address]

## 2023-05-05 DIAGNOSIS — N186 End stage renal disease: Secondary | ICD-10-CM | POA: Diagnosis not present

## 2023-05-05 DIAGNOSIS — G9340 Encephalopathy, unspecified: Secondary | ICD-10-CM | POA: Diagnosis not present

## 2023-05-05 DIAGNOSIS — E1100 Type 2 diabetes mellitus with hyperosmolarity without nonketotic hyperglycemic-hyperosmolar coma (NKHHC): Secondary | ICD-10-CM | POA: Diagnosis not present

## 2023-05-05 DIAGNOSIS — R569 Unspecified convulsions: Secondary | ICD-10-CM | POA: Diagnosis not present

## 2023-05-05 LAB — GLUCOSE, CAPILLARY
Glucose-Capillary: 115 mg/dL — ABNORMAL HIGH (ref 70–99)
Glucose-Capillary: 124 mg/dL — ABNORMAL HIGH (ref 70–99)
Glucose-Capillary: 127 mg/dL — ABNORMAL HIGH (ref 70–99)
Glucose-Capillary: 129 mg/dL — ABNORMAL HIGH (ref 70–99)
Glucose-Capillary: 133 mg/dL — ABNORMAL HIGH (ref 70–99)
Glucose-Capillary: 139 mg/dL — ABNORMAL HIGH (ref 70–99)
Glucose-Capillary: 149 mg/dL — ABNORMAL HIGH (ref 70–99)
Glucose-Capillary: 159 mg/dL — ABNORMAL HIGH (ref 70–99)
Glucose-Capillary: 168 mg/dL — ABNORMAL HIGH (ref 70–99)
Glucose-Capillary: 191 mg/dL — ABNORMAL HIGH (ref 70–99)
Glucose-Capillary: 196 mg/dL — ABNORMAL HIGH (ref 70–99)
Glucose-Capillary: 200 mg/dL — ABNORMAL HIGH (ref 70–99)
Glucose-Capillary: 224 mg/dL — ABNORMAL HIGH (ref 70–99)

## 2023-05-05 LAB — LEVETIRACETAM LEVEL: Levetiracetam Lvl: 21.1 ug/mL (ref 10.0–40.0)

## 2023-05-05 LAB — TROPONIN I (HIGH SENSITIVITY)
Troponin I (High Sensitivity): 17 ng/L (ref <18)
Troponin I (High Sensitivity): 14 ng/L (ref <18)

## 2023-05-05 MED ORDER — ONDANSETRON HCL 4 MG/2ML IJ SOLN: 4.0000 mg | Freq: Four times a day (QID) | INTRAMUSCULAR | Status: DC | PRN

## 2023-05-05 MED ORDER — GABAPENTIN 100 MG PO CAPS
100.0000 mg | ORAL_CAPSULE | Freq: Three times a day (TID) | ORAL | Status: DC
  Administered 2023-05-05 – 2023-05-07 (×7): 100 mg via ORAL
  Filled 2023-05-05 (×7): qty 1

## 2023-05-05 MED ORDER — HYDRALAZINE HCL 50 MG PO TABS
100.0000 mg | ORAL_TABLET | Freq: Three times a day (TID) | ORAL | Status: DC
  Administered 2023-05-05 – 2023-05-07 (×5): 100 mg via ORAL
  Filled 2023-05-05 (×5): qty 2

## 2023-05-05 MED ORDER — ALUM & MAG HYDROXIDE-SIMETH 200-200-20 MG/5ML PO SUSP
15.0000 mL | Freq: Four times a day (QID) | ORAL | Status: DC | PRN
  Administered 2023-05-05: 15 mL via ORAL
  Filled 2023-05-05: qty 30

## 2023-05-05 MED ORDER — INSULIN ASPART 100 UNIT/ML IJ SOLN
0.0000 [IU] | Freq: Three times a day (TID) | INTRAMUSCULAR | Status: DC
  Administered 2023-05-05: 2 [IU] via SUBCUTANEOUS

## 2023-05-05 MED ORDER — INSULIN ASPART 100 UNIT/ML IJ SOLN
0.0000 [IU] | Freq: Three times a day (TID) | INTRAMUSCULAR | Status: DC
  Administered 2023-05-05 – 2023-05-06 (×2): 1 [IU] via SUBCUTANEOUS
  Administered 2023-05-06: 3 [IU] via SUBCUTANEOUS
  Administered 2023-05-07: 4 [IU] via SUBCUTANEOUS
  Administered 2023-05-07: 2 [IU] via SUBCUTANEOUS

## 2023-05-05 MED ORDER — CARVEDILOL 6.25 MG PO TABS
6.2500 mg | ORAL_TABLET | Freq: Two times a day (BID) | ORAL | Status: DC
  Administered 2023-05-05 – 2023-05-07 (×3): 6.25 mg via ORAL
  Filled 2023-05-05 (×4): qty 1

## 2023-05-05 MED ORDER — IPRATROPIUM-ALBUTEROL 0.5-2.5 (3) MG/3ML IN SOLN: 3.0000 mL | Freq: Four times a day (QID) | RESPIRATORY_TRACT | Status: DC | PRN

## 2023-05-05 MED ORDER — LEVOTHYROXINE SODIUM 100 MCG PO TABS
200.0000 ug | ORAL_TABLET | Freq: Every day | ORAL | Status: DC
  Administered 2023-05-06 – 2023-05-07 (×2): 200 ug via ORAL
  Filled 2023-05-05 (×2): qty 2

## 2023-05-05 MED ORDER — AMLODIPINE BESYLATE 10 MG PO TABS
10.0000 mg | ORAL_TABLET | Freq: Every day | ORAL | Status: DC
  Administered 2023-05-05 – 2023-05-06 (×2): 10 mg via ORAL
  Filled 2023-05-05 (×2): qty 1

## 2023-05-05 MED ORDER — INSULIN GLARGINE-YFGN 100 UNIT/ML ~~LOC~~ SOLN
5.0000 [IU] | Freq: Two times a day (BID) | SUBCUTANEOUS | Status: DC
  Administered 2023-05-05 – 2023-05-07 (×5): 5 [IU] via SUBCUTANEOUS
  Filled 2023-05-05 (×6): qty 0.05

## 2023-05-05 MED ORDER — LEVETIRACETAM 500 MG PO TABS
500.0000 mg | ORAL_TABLET | Freq: Two times a day (BID) | ORAL | Status: DC
  Administered 2023-05-05 – 2023-05-07 (×4): 500 mg via ORAL
  Filled 2023-05-05 (×5): qty 1

## 2023-05-05 MED ORDER — ACETAMINOPHEN 325 MG PO TABS
650.0000 mg | ORAL_TABLET | Freq: Four times a day (QID) | ORAL | Status: DC | PRN
  Administered 2023-05-07: 650 mg via ORAL
  Filled 2023-05-05: qty 2

## 2023-05-05 MED ORDER — PANTOPRAZOLE SODIUM 40 MG PO TBEC
40.0000 mg | DELAYED_RELEASE_TABLET | Freq: Every day | ORAL | Status: DC
  Administered 2023-05-05 – 2023-05-07 (×3): 40 mg via ORAL
  Filled 2023-05-05 (×3): qty 1

## 2023-05-05 MED ORDER — HYDRALAZINE HCL 50 MG PO TABS: 50.0000 mg | ORAL_TABLET | Freq: Three times a day (TID) | ORAL | Status: DC

## 2023-05-05 MED ORDER — HYDROMORPHONE HCL 1 MG/ML IJ SOLN
0.5000 mg | INTRAMUSCULAR | Status: DC | PRN
  Administered 2023-05-05 – 2023-05-07 (×7): 0.5 mg via INTRAVENOUS
  Filled 2023-05-05 (×7): qty 0.5

## 2023-05-05 NOTE — Plan of Care (Signed)

## 2023-05-05 NOTE — Progress Notes (Addendum)
   05/05/23 0144  Pain Assessment  Pain Scale 0-10  Pain Score 0  Fistula / Graft Left Upper arm  No placement date or time found.   Placed prior to admission: Yes  Orientation: Left  Access Location: Upper arm  Site Condition No complications  Fistula / Graft Assessment Present;Bruit;Thrill  Status Deaccessed  Drainage Description None  Neurological  Level of Consciousness Alert  Orientation Level Other (comment) (Sleepy)  Respiratory  Respiratory Pattern Regular  Chest Assessment Chest expansion symmetrical  Bilateral Breath Sounds Diminished  Cardiac  Pulse Regular  Heart Sounds S1, S2  Jugular Venous Distention (JVD) No  ECG Monitor Yes  Cardiac Rhythm NSR  Vascular  R Radial Pulse +3  L Radial Pulse +3  GU Assessment  Genitourinary (WDL) X  Genitourinary Symptoms Oliguria  Psychosocial  Psychosocial (WDL) WDL   Received patient in bed - yes Alert and oriented. - yes x 4 Informed consent signed and in chart. - yes  TX duration:3.30  Patient tolerated well. - yes, no complication Transported back to the room - Tx done at bedside Alert, without acute distress. - yes Hand-off given to patient's nurse. - yes Manisha L   Access used: LUA AVF Access issues: None  Total UF removed: 2.0L. Per patient Dry weight is 54.3kg Medication(s) given: None Post HD VS: B/P 134/110; HR77; Temp 97.3 Post HD weight: 62.2kg - bed weight   [place] [facility]

## 2023-05-05 NOTE — Progress Notes (Signed)
Subjective: Examined in room, currently awake alert, however cannot recall to the events leading to her admission.  Currently denies shortness of breath or chest pain.  HD completed earlier this morning at 2 AM  Objective Vital signs in last 24 hours: Vitals:   05/05/23 0350 05/05/23 0825 05/05/23 1204 05/05/23 1226  BP: (!) 157/88 (!) 179/97 (!) 167/89 (!) 167/102  Pulse: 76   81  Resp: 11   14  Temp: 98 F (36.7 C) 98.6 F (37 C)  98.8 F (37.1 C)  TempSrc: Axillary Oral  Oral  SpO2: 99%   95%  Weight:       Weight change:   Physical Exam: General: Chronically ill-appearing young adult female.  NAD  Heart: RRR no MRG Lungs: CTA bilaterally nonlabored breathing Abdomen: NABS, soft NT, nondistended no ascites Extremities: No pedal edema  dialysis Access: LUA AVF+ bruit   Home meds include - norvasc 10, buspar, coreg 6.25 bid, cardizem 60 qid, lexapro, gabapentin, hydralazine 100 tid, insulin aspart/tresiba, fosrenol 1 gm ac, keppra, synthroid, reglan, renavite, protonix, prns/ vits/ supps        [facility] 3.5h  400/800   61.5kg   2/2 bath  AVF   Hep none - last OP HD 5/13, post wt 62.2kg   - mircera 75 mcg IV q 4 wks, last 5/06, due 6/03 - no vdra     Problem/Plan HHS - presenting w/ stupor and BS 710. DKA markers only slightly off. Started on IV insulin protocol.  This a.m. more alert AMS - related most likely to #1 and #3, clearly hyper osmolar nonketotic hyperglycemic state, CT head negative.  Back almost to baseline now ESRD - on HD MWF. Missed HD Wednesday had dialysis late last night plan for dialysis tomorrow and then back on schedule MWF. Labs and vol are okay.   HTN/ volume - BPs' up a bit,  under dry wt, no vol excess on exam. Small UF.  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 Follow.  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 No results for input(s): "LIPASE", "AMYLASE" in the last 168 hours. No results for input(s): "AMMONIA" in the last 168 hours. CBC: Recent Labs  Lab 05/04/23 0030 05/04/23 0053  WBC 8.3  --   NEUTROABS 6.5  --   HGB 9.9* 12.9  HCT 30.8* 38.0  MCV 82.8  --   PLT 139*  --    Cardiac Enzymes: No results for input(s): "CKTOTAL", "CKMB", "CKMBINDEX", "TROPONINI" in the last 168 hours. CBG: Recent Labs  Lab 05/05/23 0659 05/05/23 0807 05/05/23 0916 05/05/23 1121 05/05/23 1220  GLUCAP 159* 168* 149* 196* 200*    Studies/Results: EEG adult  Result Date: 05/04/2023 Charlsie Quest, MD     05/04/2023  9:12 AM Patient Name: Angela [name] MRN: [id number] Epilepsy Attending: Charlsie Quest Referring Physician/Provider:  Caryl Pina, MD Date: 05/04/2023 Duration: 26.01 mins Patient history:  37 y.o. female who presents to the emergency department after she was found unresponsive. EEG to evaluate for seizure. Level of alertness:  lethargic AEDs during EEG study: LEV Technical aspects: This EEG study was done with scalp electrodes positioned according to the 10-20 International system of electrode placement. Electrical activity was reviewed with band pass filter of 1-70Hz , sensitivity of 7 uV/mm, display speed of 83mm/sec with a 60Hz  notched filter applied as appropriate. EEG data were recorded continuously and digitally stored.  Video monitoring was available and reviewed as appropriate. Description: EEG showed continuous generalized 3 to 6 Hz theta-delta slowing.  Physiologic photic driving was not seen during photic stimulation.  Hyperventilation was not performed.   ABNORMALITY - Continuous slow, generalized IMPRESSION: This study is suggestive of moderate diffuse encephalopathy, nonspecific etiology. No seizures or epileptiform discharges were seen throughout the recording. Charlsie Quest   CT Head Wo Contrast  Result Date: 05/04/2023 CLINICAL DATA:  Diabetic ketoacidosis, delirium EXAM: CT HEAD WITHOUT CONTRAST TECHNIQUE: Contiguous axial images were obtained from the base of the skull through the vertex without intravenous contrast. RADIATION DOSE REDUCTION: This exam was performed according to the departmental dose-optimization program which includes automated exposure control, adjustment of the mA and/or kV according to patient size and/or use of iterative reconstruction technique. COMPARISON:  None Available. FINDINGS: Brain: Remote infarct within the left caudate head and anterior limb of the left internal capsule noted. No acute intracranial hemorrhage or infarct. No abnormal mass effect or midline shift. No abnormal intra or extra-axial mass lesion or fluid collection. Ventricular size is normal. Cerebellum is unremarkable. Vascular: No hyperdense vessel or unexpected calcification. Skull: No acute fracture. Remote small caliber burr hole within the right frontal bone. Sinuses/Orbits: Large mucous retention cyst within the right maxillary sinus and posterior right ethmoid air cell. Remaining paranasal sinuses are clear. Orbits are unremarkable. Other: Mastoid air cells and middle ear cavities are clear. IMPRESSION: 1. No acute intracranial abnormality. 2. Remote infarct within the left caudate head and anterior limb of the left internal capsule. 3. Large mucous retention cyst within the right maxillary sinus and posterior right ethmoid air cell. Electronically Signed   By: Helyn Numbers M.D.   On: 05/04/2023 01:23   Medications:  dextrose Stopped (05/05/23  1209)   dextrose     insulin Stopped (05/05/23 1209)    amLODipine  10 mg Oral QHS   carvedilol  6.25 mg Oral BID WC   Chlorhexidine Gluconate Cloth  6 each Topical Q0600   gabapentin  100 mg Oral TID   heparin  5,000 Units Subcutaneous Q8H   insulin aspart  0-9 Units Subcutaneous TID WC   insulin glargine-yfgn  5 Units Subcutaneous BID   levETIRAcetam  500 mg Oral BID   [START ON 05/06/2023] levothyroxine  200 mcg Oral QAC breakfast   pantoprazole  40 mg Oral Daily

## 2023-05-05 NOTE — Inpatient Diabetes Management (Signed)
Inpatient Diabetes Program Recommendations  AACE/ADA: New Consensus Statement on Inpatient Glycemic Control (2015)  Target Ranges:  Prepandial:   less than 140 mg/dL      Peak postprandial:   less than 180 mg/dL (1-2 hours)      Critically ill patients:  140 - 180 mg/dL   Lab Results  Component Value Date   GLUCAP 149 (H) 05/05/2023   HGBA1C 12.0 (H) 01/28/2023    Review of Glycemic Control  Latest Reference Range & Units 05/05/23 05:48 05/05/23 06:59 05/05/23 08:07 05/05/23 09:16  Glucose-Capillary 70 - 99 mg/dL 409 (H) 811 (H) 914 (H) 149 (H)  (H): Data is abnormally high Diabetes history: DM 1 (very sensitive to insulin) Outpatient Diabetes medications:  Dexcom G6 Novolog 2-3 units tid with meals Tresiba 5 units bid Current orders for Inpatient glycemic control:  IV insulin-to transition to Semglee 5 units BID, Novolog 0-9 units TID   Inpatient Diabetes Program Recommendations:    Patient usually very sensitive to insulin.   Consider changing correction to Novolog 0-6 units TID.   Thanks, Lujean Rave, MSN, RNC-OB Diabetes Coordinator [phone] (8a-5p)

## 2023-05-05 NOTE — TOC Transition Note (Signed)
Transition of Care [facility]) - CM/SW Discharge Note   Patient Details  Name: Angela [name] MRN: [id number] Date of Birth: [birth year]  [facility] CM/SW Contact:  Gordy Clement, RN Phone Number: 05/05/2023, 4:19 PM   Clinical Narrative:     Anticipate patient to dc to home Saturday, NO TOC needs. Mom to transport         Barriers to Discharge: Continued Medical Work up   Patient Goals and CMS Choice      Discharge Placement                         Discharge Plan and Services Additional resources added to the After Visit Summary for                                       Social Determinants of Health (SDOH) Interventions SDOH Screenings   Food Insecurity: No Food Insecurity (01/25/2022)  Housing: High Risk (01/06/2022)  Transportation Needs: No Transportation Needs (01/24/2022)  Depression (PHQ2-9): High Risk (03/07/2023)  Financial Resource Strain: Medium Risk (01/20/2022)  Physical Activity: Inactive (10/29/2019)  Tobacco Use: Medium Risk (05/04/2023)     Readmission Risk Interventions    10/18/2021    1:09 PM  Readmission Risk Prevention Plan  Transportation Screening Complete  PCP or Specialist Appt within 3-5 Days Complete  HRI or Home Care Consult Complete  Social Work Consult for Recovery Care Planning/Counseling Complete  Palliative Care Screening Complete  Medication Review Oceanographer) Referral to Pharmacy

## 2023-05-05 NOTE — Progress Notes (Signed)
PROGRESS NOTE        PATIENT DETAILS Name: Angela [name] Age: 37 y.o. Sex: female Date of Birth: [birth year] Admit Date: 05/04/2023 Admitting Physician Briscoe Deutscher, MD [id number], Binnie Rail, MD  Brief Summary: Patient is a 37 y.o.  female with history of poorly controlled DM-2, ESRD on HD, prior history of hemorrhagic stroke-brought in for acute metabolic encephalopathy/unresponsiveness-secondary to DKA/missed HD.  Significant events: 5/16>> admit to [facility]  Significant studies: 5/16>> CT head: No acute intracranial abnormality. 5/16>> Spot EEG: Negative for seizures.  Significant microbiology data: None  Procedures: None  Consults: Nephrology  Subjective: Seems to have improved compared to yesterday's documentation-completely awake/alert-does not recall exactly what happened that led to her hospitalization.  No chest pain/shortness of breath.  Wants to eat.  Objective: Vitals: Blood pressure (!) 179/97, pulse 76, temperature 98.6 F (37 C), temperature source Oral, resp. rate 11, weight 64.4 kg, SpO2 99 %.   Exam: Gen Exam:Alert awake-not in any distress HEENT:atraumatic, normocephalic Chest: B/L clear to auscultation anteriorly CVS:S1S2 regular Abdomen:soft non tender, non distended Extremities:no edema Neurology: Non focal Skin: no rash  Pertinent Labs/Radiology:    Latest Ref Rng & Units 05/04/2023   12:53 AM 05/04/2023   12:30 AM 04/28/2023    1:27 PM  CBC  WBC 4.0 - 10.5 K/uL  8.3  7.4   Hemoglobin 12.0 - 15.0 g/dL 09.8  9.9  11.9   Hematocrit 36.0 - 46.0 % 38.0  30.8  34.0   Platelets 150 - 400 K/uL  139  162     Lab Results  Component Value Date   NA 127 (L) 05/04/2023   K 4.4 05/04/2023   CL 89 (L) 05/04/2023   CO2 24 05/04/2023      Assessment/Plan: Hyperosmolar nonketotic hyperglycemic state Resolved-stop IV insulin infusion today. Transition to SQ insulin today  Acute metabolic encephalopathy Likely due to  hyperosmolar nonketotic hyperglycemic state and missed HD CT head negative Improved after dialysis and treatment of hyperglycemic state-suspect she is back to her baseline  DM-2 with uncontrolled hyperglycemia (A1c 12.0 on 2/10) Will resume home regimen of Tresiba 5 units twice daily/SSI after discontinuing insulin infusion. Follow CBGs and optimize regimen accordingly Counseled regarding importance of compliance  ESRD on HD MWF Nephrology following and directing care  HTN BP creeping up Resume amlodipine and Coreg Follow BP trend and resume rest of her antihypertensives accordingly.  Seizure disorder CT head/EEG stable Keppra.    History of left ICH Seems to have recovered- nonfocal exam.  Peripheral neuropathy Resume Neurontin  Hypothyroidism Synthroid  BMI: Estimated body mass index is 24.37 kg/m as calculated from the following:   Height as of 04/28/23: 5\' 4"  (1.626 m).   Weight as of this encounter: 64.4 kg.   Code status:   Code Status: Full Code   DVT Prophylaxis: heparin injection 5,000 Units Start: 05/04/23 0600   Family Communication:   Disposition Plan: Status is: Inpatient Remains inpatient appropriate because: Mother-Sandra-[phone]-updated over the phone   Planned Discharge Destination:Home on 5/18.   Diet: Diet Order             Diet NPO time specified  Diet effective now                     Antimicrobial agents: Anti-infectives (From admission, onward)    None  MEDICATIONS: Scheduled Meds:  Chlorhexidine Gluconate Cloth  6 each Topical Q0600   heparin  5,000 Units Subcutaneous Q8H   insulin aspart  0-9 Units Subcutaneous TID WC   insulin glargine-yfgn  5 Units Subcutaneous BID   Continuous Infusions:  dextrose 50 mL/hr at 05/05/23 0959   dextrose     insulin 0.2 Units/hr (05/05/23 0959)   levETIRAcetam Stopped (05/05/23 0901)   PRN Meds:.dextrose, labetalol   I have personally reviewed following labs  and imaging studies  LABORATORY DATA: CBC: Recent Labs  Lab 04/28/23 1327 05/04/23 0030 05/04/23 0053  WBC 7.4 8.3  --   NEUTROABS  --  6.5  --   HGB 10.5* 9.9* 12.9  HCT 34.0* 30.8* 38.0  MCV 86.1 82.8  --   PLT 162 139*  --     Basic Metabolic Panel: Recent Labs  Lab 05/04/23 0030 05/04/23 0053 05/04/23 0840 05/04/23 1245 05/04/23 1638 05/04/23 2119  NA 124* 124* 128* 130* 130* 127*  K 4.7 4.9 4.2 4.7 4.6 4.4  CL 83*  --  88* 90* 88* 89*  CO2 22  --  21* 24 25 24   GLUCOSE 710*  --  278* 128* 104* 167*  BUN 69*  --  67* 68* 70* 67*  CREATININE 10.17*  --  10.19* 10.14* 10.54* 10.55*  CALCIUM 9.6  --  9.9 10.1 9.8 9.5    GFR: Estimated Creatinine Clearance: 6.2 mL/min (A) (by C-G formula based on SCr of 10.55 mg/dL (H)).  Liver Function Tests: Recent Labs  Lab 04/28/23 1327  AST 45*  ALT 100*  ALKPHOS 388*  BILITOT 1.2  PROT 7.5  ALBUMIN 4.0   No results for input(s): "LIPASE", "AMYLASE" in the last 168 hours. No results for input(s): "AMMONIA" in the last 168 hours.  Coagulation Profile: No results for input(s): "INR", "PROTIME" in the last 168 hours.  Cardiac Enzymes: No results for input(s): "CKTOTAL", "CKMB", "CKMBINDEX", "TROPONINI" in the last 168 hours.  BNP (last 3 results) No results for input(s): "PROBNP" in the last 8760 hours.  Lipid Profile: No results for input(s): "CHOL", "HDL", "LDLCALC", "TRIG", "CHOLHDL", "LDLDIRECT" in the last 72 hours.  Thyroid Function Tests: Recent Labs    05/04/23 0840  TSH 1.356    Anemia Panel: No results for input(s): "VITAMINB12", "FOLATE", "FERRITIN", "TIBC", "IRON", "RETICCTPCT" in the last 72 hours.  Urine analysis:    Component Value Date/Time   COLORURINE YELLOW 02/11/2023 1148   APPEARANCEUR HAZY (A) 02/11/2023 1148   APPEARANCEUR Clear 04/30/2019 1141   LABSPEC 1.011 02/11/2023 1148   PHURINE 8.0 02/11/2023 1148   GLUCOSEU 50 (A) 02/11/2023 1148   GLUCOSEU 100 (A) 10/14/2009 2309    HGBUR NEGATIVE 02/11/2023 1148   BILIRUBINUR NEGATIVE 02/11/2023 1148   BILIRUBINUR Negative 04/30/2019 1141   BILIRUBINUR negative 12/30/2016 1617   KETONESUR NEGATIVE 02/11/2023 1148   PROTEINUR >=300 (A) 02/11/2023 1148   UROBILINOGEN 0.2 07/19/2018 1351   UROBILINOGEN 0.2 10/24/2015 0029   NITRITE NEGATIVE 02/11/2023 1148   LEUKOCYTESUR TRACE (A) 02/11/2023 1148    Sepsis Labs: Lactic Acid, Venous    Component Value Date/Time   LATICACIDVEN 1.8 05/04/2023 0139    MICROBIOLOGY: No results found for this or any previous visit (from the past 240 hour(s)).  RADIOLOGY STUDIES/RESULTS: EEG adult  Result Date: 05/04/2023 Charlsie Quest, MD     05/04/2023  9:12 AM Patient Name: Falyn Luyster MRN: [id number] Epilepsy Attending: Charlsie Quest Referring Physician/Provider: Caryl Pina, MD Date: 05/04/2023 Duration:  26.01 mins Patient history:  37 y.o. female who presents to the emergency department after she was found unresponsive. EEG to evaluate for seizure. Level of alertness:  lethargic AEDs during EEG study: LEV Technical aspects: This EEG study was done with scalp electrodes positioned according to the 10-20 International system of electrode placement. Electrical activity was reviewed with band pass filter of 1-70Hz , sensitivity of 7 uV/mm, display speed of 54mm/sec with a 60Hz  notched filter applied as appropriate. EEG data were recorded continuously and digitally stored.  Video monitoring was available and reviewed as appropriate. Description: EEG showed continuous generalized 3 to 6 Hz theta-delta slowing. Physiologic photic driving was not seen during photic stimulation.  Hyperventilation was not performed.   ABNORMALITY - Continuous slow, generalized IMPRESSION: This study is suggestive of moderate diffuse encephalopathy, nonspecific etiology. No seizures or epileptiform discharges were seen throughout the recording. Charlsie Quest   CT Head Wo Contrast  Result Date:  05/04/2023 CLINICAL DATA:  Diabetic ketoacidosis, delirium EXAM: CT HEAD WITHOUT CONTRAST TECHNIQUE: Contiguous axial images were obtained from the base of the skull through the vertex without intravenous contrast. RADIATION DOSE REDUCTION: This exam was performed according to the departmental dose-optimization program which includes automated exposure control, adjustment of the mA and/or kV according to patient size and/or use of iterative reconstruction technique. COMPARISON:  None Available. FINDINGS: Brain: Remote infarct within the left caudate head and anterior limb of the left internal capsule noted. No acute intracranial hemorrhage or infarct. No abnormal mass effect or midline shift. No abnormal intra or extra-axial mass lesion or fluid collection. Ventricular size is normal. Cerebellum is unremarkable. Vascular: No hyperdense vessel or unexpected calcification. Skull: No acute fracture. Remote small caliber burr hole within the right frontal bone. Sinuses/Orbits: Large mucous retention cyst within the right maxillary sinus and posterior right ethmoid air cell. Remaining paranasal sinuses are clear. Orbits are unremarkable. Other: Mastoid air cells and middle ear cavities are clear. IMPRESSION: 1. No acute intracranial abnormality. 2. Remote infarct within the left caudate head and anterior limb of the left internal capsule. 3. Large mucous retention cyst within the right maxillary sinus and posterior right ethmoid air cell. Electronically Signed   By: Helyn Numbers M.D.   On: 05/04/2023 01:23     LOS: 1 day   Jeoffrey Massed, MD  [facility]    To contact the attending provider between 7A-7P or the covering provider during after hours 7P-7A, please log into the web site [email or web address] and access using universal [place] password for that web site. If you do not have the password, please call the hospital operator.  05/05/2023, 10:10 AM

## 2023-05-06 DIAGNOSIS — E1100 Type 2 diabetes mellitus with hyperosmolarity without nonketotic hyperglycemic-hyperosmolar coma (NKHHC): Secondary | ICD-10-CM | POA: Diagnosis not present

## 2023-05-06 LAB — RENAL FUNCTION PANEL
Albumin: 2.7 g/dL — ABNORMAL LOW (ref 3.5–5.0)
Anion gap: 12 (ref 5–15)
BUN: 37 mg/dL — ABNORMAL HIGH (ref 6–20)
CO2: 25 mmol/L (ref 22–32)
Calcium: 8.9 mg/dL (ref 8.9–10.3)
Chloride: 92 mmol/L — ABNORMAL LOW (ref 98–111)
Creatinine, Ser: 7.07 mg/dL — ABNORMAL HIGH (ref 0.44–1.00)
GFR, Estimated: 7 mL/min — ABNORMAL LOW (ref >60)
Glucose, Bld: 249 mg/dL — ABNORMAL HIGH (ref 70–99)
Phosphorus: 5.2 mg/dL — ABNORMAL HIGH (ref 2.5–4.6)
Potassium: 4.2 mmol/L (ref 3.5–5.1)
Sodium: 129 mmol/L — ABNORMAL LOW (ref 135–145)

## 2023-05-06 LAB — CBC
HCT: 40 % (ref 36.0–46.0)
Hemoglobin: 12.7 g/dL (ref 12.0–15.0)
MCH: 26 pg (ref 26.0–34.0)
MCHC: 31.8 g/dL (ref 30.0–36.0)
MCV: 82 fL (ref 80.0–100.0)
Platelets: 135 10*3/uL — ABNORMAL LOW (ref 150–400)
RBC: 4.88 MIL/uL (ref 3.87–5.11)
RDW: 16.7 % — ABNORMAL HIGH (ref 11.5–15.5)
WBC: 5.3 10*3/uL (ref 4.0–10.5)
nRBC: 0 % (ref 0.0–0.2)

## 2023-05-06 LAB — GLUCOSE, CAPILLARY
Glucose-Capillary: 221 mg/dL — ABNORMAL HIGH (ref 70–99)
Glucose-Capillary: 168 mg/dL — ABNORMAL HIGH (ref 70–99)
Glucose-Capillary: 234 mg/dL — ABNORMAL HIGH (ref 70–99)

## 2023-05-06 NOTE — Progress Notes (Signed)
Subjective: Seen in hemodialysis room, more alert this a.m., aware it is Saturday.  Some complaints of right foot discomfort with history of chronic neuropathy.  Objective Vital signs in last 24 hours: Vitals:   05/06/23 0856 05/06/23 0937 05/06/23 1006 05/06/23 1030  BP: (!) 140/89 (!) 117/91  127/88  Pulse: 72 76 77   Resp: 15 10 11 13   Temp:      TempSrc:      SpO2: 96% (!) 84% 94% (!) 88%  Weight:       Weight change:   Physical Exam: General: Chronically ill-appearing young adult female.  NAD  Heart: RRR no MRG Lungs: CTA bilaterally nonlabored breathing Abdomen: NABS, soft NT, nondistended no ascites Extremities: No pedal edema  dialysis Access: LUA AVF+ bruit     Home meds include - norvasc 10, buspar, coreg 6.25 bid, cardizem 60 qid, lexapro, gabapentin, hydralazine 100 tid, insulin aspart/tresiba, fosrenol 1 gm ac, keppra, synthroid, reglan, renavite, protonix, prns/ vits/ supps        [facility] HD: [facility] 3.5h  400/800   61.5kg   2/2 bath  AVF   Hep none - last OP HD 5/13, post wt 62.2kg   - mircera 75 mcg IV q 4 wks, last 5/06, due 6/03 - no vdra     Problem/Plan HHS - presenting w/ stupor and BS 710. DKA markers only slightly off. Started on IV insulin protocol.  This a.m. more alert AMS - related most likely to #1 and #3,  hyper osmolar nonketotic hyperglycemic state, CT head negative.  Back to baseline now ESRD - on HD MWF. Missed HD Wednesday had dialysis late 5/16 night plan for dialysis today and then back on schedule MWF.  Monday 5/20 HTN/ volume - BPs' up a bit,  under dry wt, no vol excess on exam. Small UF.  Anemia esrd - Hb 12.7  esa not due for 3 wks. Follow.  MBD ckd -calcium okay phosphorus 5.2 Seizure d/o - takes keppra  Lenny Pastel, PA-C [facility] [phone] 05/06/2023,11:17 AM  LOS: 2 days   Labs: Basic Metabolic Panel: Recent Labs  Lab 05/04/23 1638 05/04/23 2119 05/06/23 0350  NA 130* 127* 129*  K 4.6 4.4 4.2   CL 88* 89* 92*  CO2 25 24 25   GLUCOSE 104* 167* 249*  BUN 70* 67* 37*  CREATININE 10.54* 10.55* 7.07*  CALCIUM 9.8 9.5 8.9  PHOS  --   --  5.2*   Liver Function Tests: Recent Labs  Lab 05/06/23 0350  ALBUMIN 2.7*   No results for input(s): "LIPASE", "AMYLASE" in the last 168 hours. No results for input(s): "AMMONIA" in the last 168 hours. CBC: Recent Labs  Lab 05/04/23 0030 05/04/23 0053 05/06/23 0350  WBC 8.3  --  5.3  NEUTROABS 6.5  --   --   HGB 9.9* 12.9 12.7  HCT 30.8* 38.0 40.0  MCV 82.8  --  82.0  PLT 139*  --  135*   Cardiac Enzymes: No results for input(s): "CKTOTAL", "CKMB", "CKMBINDEX", "TROPONINI" in the last 168 hours. CBG: Recent Labs  Lab 05/05/23 1121 05/05/23 1220 05/05/23 1548 05/05/23 2056 05/06/23 0753  GLUCAP 196* 200* 191* 224* 221*    Studies/Results: No results found. Medications:   amLODipine  10 mg Oral QHS   carvedilol  6.25 mg Oral BID WC   Chlorhexidine Gluconate Cloth  6 each Topical Q0600   gabapentin  100 mg Oral TID   heparin  5,000 Units Subcutaneous  Q8H   hydrALAZINE  100 mg Oral Q8H   insulin aspart  0-6 Units Subcutaneous TID WC   insulin glargine-yfgn  5 Units Subcutaneous BID   levETIRAcetam  500 mg Oral BID   levothyroxine  200 mcg Oral QAC breakfast   pantoprazole  40 mg Oral Daily

## 2023-05-06 NOTE — Progress Notes (Signed)
Received patient in bed.Awake,alert and oriented x 4.  Access used : Left upper arm AVF,that worked well.  Duration of treatment:3.5 hours.  Fluid removed : 2 liters.  Hemo issue:Tolerated treatment.  Hand off to the patient's nurse.

## 2023-05-06 NOTE — Evaluation (Signed)
Physical Therapy Evaluation Patient Details Name: Angela [name] MRN: [id number] DOB: [birth year] Today's Date: 05/06/2023  History of Present Illness  Patient is a 37 y.o.  female admitted with acute metabolic encephalopathy/  unresponsiveness-secondary to DKA/missed HD.  PMH significant for poorly controlled DM-2, ESRD on HD, prior history of hemorrhagic stroke, h/o seizures  Clinical Impression  Patient present with dependencies in gait and transfers.  PTA pt reports she was independent in gait and mobility.  Today patient requires assistance for balance and mobility due to generalized weakness and pain right hip.  Recommend patient use RW at discharge and have assistance when up OOB.  Patient voiced understanding.  I recommend f/u PT at discharge to progress patient back to her baseline.         Recommendations for follow up therapy are one component of a multi-disciplinary discharge planning process, led by the attending physician.  Recommendations may be updated based on patient status, additional functional criteria and insurance authorization.  Follow Up Recommendations       Assistance Recommended at Discharge Frequent or constant Supervision/Assistance (for next 24-48 hours)  Patient can return home with the following  A little help with walking and/or transfers;A little help with bathing/dressing/bathroom;Assistance with cooking/housework;Assist for transportation;Help with stairs or ramp for entrance    Equipment Recommendations Rolling walker (2 wheels)  Recommendations for Other Services       Functional Status Assessment Patient has had a recent decline in their functional status and demonstrates the ability to make significant improvements in function in a reasonable and predictable amount of time.     Precautions / Restrictions Precautions Precautions: Fall Restrictions Weight Bearing Restrictions: No      Mobility  Bed Mobility Overal bed mobility: Modified  Independent             General bed mobility comments: used bed railing to come to edge of bed    Transfers Overall transfer level: Needs assistance Equipment used: Rolling walker (2 wheels) Transfers: Sit to/from Stand Sit to Stand: Min assist                Ambulation/Gait Ambulation/Gait assistance: Min Chemical engineer (Feet): 60 Feet Assistive device: Rolling walker (2 wheels) Gait Pattern/deviations: Step-to pattern, Decreased stride length, Antalgic, Decreased stance time - right, Decreased weight shift to right Gait velocity: decreased     General Gait Details: c/o pain right hip  Stairs            Wheelchair Mobility    Modified Rankin (Stroke Patients Only)       Balance Overall balance assessment: Needs assistance Sitting-balance support: No upper extremity supported, Feet supported Sitting balance-Leahy Scale: Good     Standing balance support: Bilateral upper extremity supported, Reliant on assistive device for balance, During functional activity Standing balance-Leahy Scale: Poor                               Pertinent Vitals/Pain Pain Assessment Pain Assessment: 0-10 Pain Score: 5  Pain Location: right hip Pain Descriptors / Indicators: Discomfort, Grimacing, Guarding Pain Intervention(s): Limited activity within patient's tolerance, Monitored during session    Home Living Family/patient expects to be discharged to:: Private residence Living Arrangements: Parent Available Help at Discharge: Family;Available 24 hours/day Type of Home: House Home Access: Stairs to enter Entrance Stairs-Rails: Can reach both Entrance Stairs-Number of Steps: 10-12   Home Layout: One level Home Equipment: Agricultural consultant (2 wheels)  Prior Function               Mobility Comments: Pt reports prior to stroke she was indep and driving.  States since stroke she can ambulate independently but her mother does not let her  drive.       Hand Dominance        Extremity/Trunk Assessment   Upper Extremity Assessment Upper Extremity Assessment: Generalized weakness    Lower Extremity Assessment Lower Extremity Assessment: Generalized weakness    Cervical / Trunk Assessment Cervical / Trunk Assessment: Normal  Communication   Communication: Expressive difficulties (slow speech)  Cognition Arousal/Alertness: Lethargic Behavior During Therapy: Flat affect Overall Cognitive Status: Within Functional Limits for tasks assessed                                          General Comments      Exercises     Assessment/Plan    PT Assessment All further PT needs can be met in the next venue of care  PT Problem List Decreased strength;Decreased activity tolerance;Decreased mobility;Decreased balance;Pain       PT Treatment Interventions      PT Goals (Current goals can be found in the Care Plan section)  Acute Rehab PT Goals Patient Stated Goal: find out what is going on with my leg PT Goal Formulation: All assessment and education complete, DC therapy (planning to discharge today)    Frequency       Co-evaluation               AM-PAC PT "6 Clicks" Mobility  Outcome Measure Help needed turning from your back to your side while in a flat bed without using bedrails?: A Little Help needed moving from lying on your back to sitting on the side of a flat bed without using bedrails?: A Little Help needed moving to and from a bed to a chair (including a wheelchair)?: A Little Help needed standing up from a chair using your arms (e.g., wheelchair or bedside chair)?: A Little Help needed to walk in hospital room?: A Little Help needed climbing 3-5 steps with a railing? : A Lot 6 Click Score: 17    End of Session   Activity Tolerance: Patient limited by lethargy;Patient limited by pain Patient left: in bed;with call bell/phone within reach Nurse Communication: Mobility  status PT Visit Diagnosis: Unsteadiness on feet (R26.81);Muscle weakness (generalized) (M62.81);Other abnormalities of gait and mobility (R26.89)    Time: 1450-1515 PT Time Calculation (min) (ACUTE ONLY): 25 min   Charges:   PT Evaluation $PT Eval Moderate Complexity: 1 Mod          05/06/2023 Margie, PT Acute Rehabilitation Services Office:  [phone]   Olivia Canter 05/06/2023, 3:18 PM

## 2023-05-06 NOTE — Discharge Instructions (Signed)
Follow with Primary MD Marcine Matar, MD in 7 days   Get CBC, CMP, 2 view Chest X ray -  checked next visit with your primary MD   Activity: As tolerated with Full fall precautions use walker/cane & assistance as needed  Disposition Home   Diet: Renal-low carbohydrate diet with 1.2 L total fluid restriction per day.  Check CBGs q. ACH S.  Special Instructions: If you have smoked or chewed Tobacco  in the last 2 yrs please stop smoking, stop any regular Alcohol  and or any Recreational drug use.  On your next visit with your primary care physician please Get Medicines reviewed and adjusted.  Please request your Prim.MD to go over all Hospital Tests and Procedure/Radiological results at the follow up, please get all Hospital records sent to your Prim MD by signing hospital release before you go home.  If you experience worsening of your admission symptoms, develop shortness of breath, life threatening emergency, suicidal or homicidal thoughts you must seek medical attention immediately by calling 911 or calling your MD immediately  if symptoms less severe.  You Must read complete instructions/literature along with all the possible adverse reactions/side effects for all the Medicines you take and that have been prescribed to you. Take any new Medicines after you have completely understood and accpet all the possible adverse reactions/side effects.

## 2023-05-06 NOTE — Discharge Summary (Signed)
Angela [name] FOY:[id number] DOB: [birth year] DOA: 05/04/2023  PCP: Marcine Matar, MD  Admit date: 05/04/2023  Discharge date: 05/07/2023  Admitted From: Home   Disposition:  Home   Recommendations for Outpatient Follow-up:   Follow up with PCP in 1-2 weeks  PCP Please obtain BMP/CBC, 2 view CXR in 1week,  (see Discharge instructions)   PCP Please follow up on the following pending results: monitor CBGs closely   Home Health: None   Equipment/Devices: None  Consultations: Renal Discharge Condition: Stable    CODE STATUS: Full     Diet Recommendation: Renal-low carbohydrate diet, 1.2 L fluid restriction per day    Chief Complaint  Patient presents with   Hyperglycemia     Brief history of present illness from the day of admission and additional interim summary    37 y.o.  female with history of poorly controlled DM-2, ESRD on HD, prior history of hemorrhagic stroke-brought in for acute metabolic encephalopathy/unresponsiveness-secondary to DKA/missed HD.   Significant events: 5/16>> admit to [facility]   Significant studies: 5/16>> CT head: No acute intracranial abnormality. 5/16>> Spot EEG: Negative for seizures.                                                                 Hospital Course   Hyperosmolar nonketotic hyperglycemic state Resolved-stop IV insulin infusion today. Transition to SQ insulin today   DM-2 with uncontrolled hyperglycemia (A1c 12.0 on 2/10) Now stable on her home regimen of Tresiba 5 units twice daily/SSI after discontinuing insulin infusion.  Counseled on compliance.  Mother bedside agrees that she has all the supplies at home, mother takes care of the daughters medications    Acute metabolic encephalopathy Likely due to hyperosmolar nonketotic hyperglycemic state and missed  HD CT head negative Improved after dialysis and treatment of hyperglycemic state-suspect she is back to her baseline   ESRD on HD [facility] Nephrology following and directing care, off cycle HD this morning after that discharge home.   HTN BP creeping up Resume amlodipine and Coreg     Seizure disorder CT head/EEG stable Keppra.     History of left ICH Seems to have recovered- nonfocal exam.   Peripheral neuropathy Resume Neurontin   Hypothyroidism Synthroid  Patient was discharged on 05/06/2023 after HD treatment but wanted to stay overnight as she was having some right hip discomfort, she apparently fell at home before coming here and landed on her right hip.  Seen by PT, does not qualify for home PT due to insurance issues, home walker added.  CT of the right hip and x-ray of the right knee nonacute.  Requested to follow-up with PCP, plan discussed with patient's mother.  Discharge diagnosis     Principal Problem:   Hyperosmolar hyperglycemic state (HHS) (HCC) Active Problems:  ESRD on dialysis (HCC)   Seizure (HCC)   HTN (hypertension)   Acute encephalopathy    Discharge instructions    Discharge Instructions     Discharge instructions   Complete by: As directed    Follow with Primary MD Marcine Matar, MD in 7 days   Get CBC, CMP, 2 view Chest X ray -  checked next visit with your primary MD   Activity: As tolerated with Full fall precautions use walker/cane & assistance as needed  Disposition Home   Diet: Renal-low carbohydrate diet with 1.2 L total fluid restriction per day.  Check CBGs q. ACH S.  Special Instructions: If you have smoked or chewed Tobacco  in the last 2 yrs please stop smoking, stop any regular Alcohol  and or any Recreational drug use.  On your next visit with your primary care physician please Get Medicines reviewed and adjusted.  Please request your Prim.MD to go over all Hospital Tests and Procedure/Radiological results at the  follow up, please get all Hospital records sent to your Prim MD by signing hospital release before you go home.  If you experience worsening of your admission symptoms, develop shortness of breath, life threatening emergency, suicidal or homicidal thoughts you must seek medical attention immediately by calling 911 or calling your MD immediately  if symptoms less severe.  You Must read complete instructions/literature along with all the possible adverse reactions/side effects for all the Medicines you take and that have been prescribed to you. Take any new Medicines after you have completely understood and accpet all the possible adverse reactions/side effects.   Increase activity slowly   Complete by: As directed        Discharge Medications   Allergies as of 05/07/2023       Reactions   Bactrim [sulfamethoxazole-trimethoprim] Hives, Itching   Feraheme [ferumoxytol] Itching   Zestril [lisinopril] Other (See Comments)   Hyperkalemia         Medication List     TAKE these medications    albuterol 108 (90 Base) MCG/ACT inhaler Commonly known as: Proventil HFA Inhale 2 puffs into the lungs every 6 (six) hours as needed for wheezing or shortness of breath.   amLODipine 10 MG tablet Commonly known as: NORVASC Take 1 tablet (10 mg total) by mouth at bedtime.   busPIRone 15 MG tablet Commonly known as: BUSPAR TAKE ONE TABLET BY MOUTH EVERYDAY AT BEDTIME What changed: See the new instructions.   carvedilol 6.25 MG tablet Commonly known as: COREG Take 1 tablet (6.25 mg total) by mouth 2 (two) times daily with a meal.   Comfort EZ Pen Needles 32G X 4 MM Misc Generic drug: Insulin Pen Needle USE TO INJECT insulin ONCE DAILY AS DIRECTED   Darbepoetin Alfa 100 MCG/0.5ML Sosy injection Commonly known as: ARANESP Inject 0.5 mLs (100 mcg total) into the skin every Friday at 6 PM.   Dexcom G6 Sensor Misc APPLY 1 SENSOR EVERY 10 DAYS   Dexcom G6 Sensor Misc SMARTSIG:1 Topical  Every 10 Days   Dexcom G6 Transmitter Misc See admin instructions.   diltiazem 60 MG tablet Commonly known as: CARDIZEM Take 1 tablet (60 mg total) by mouth every 6 (six) hours.   escitalopram 20 MG tablet Commonly known as: LEXAPRO Take 1 tablet (20 mg total) by mouth daily.   gabapentin 100 MG capsule Commonly known as: NEURONTIN Take 1 capsule (100 mg total) by mouth 3 (three) times daily.   hydrALAZINE 100 MG tablet Commonly  known as: APRESOLINE Take 1 tablet (100 mg total) by mouth 3 (three) times daily.   Insulin Syringe-Needle U-100 31G X 15/64" 1 ML Misc Commonly known as: BD Insulin Syringe Ultrafine Used to give daily insulin injections.   lanthanum 1000 MG chewable tablet Commonly known as: FOSRENOL Chew 1 tablet (1,000 mg total) by mouth with breakfast, with lunch, and with evening meal.   levETIRAcetam 500 MG tablet Commonly known as: KEPPRA Take 1 tablet (500 mg total) by mouth 2 (two) times daily.   levothyroxine 200 MCG tablet Commonly known as: SYNTHROID Take 1 tablet (200 mcg total) by mouth daily before breakfast. Stop the 175 mcg dose   lidocaine 5 % Commonly known as: LIDODERM Place 1 patch onto the skin daily. Remove & Discard patch within 12 hours or as directed by MD What changed:  when to take this reasons to take this additional instructions   metoCLOPramide 5 MG tablet Commonly known as: REGLAN Take 1 tablet (5 mg total) by mouth 2 (two) times daily with a meal.   multivitamin Tabs tablet Take 1 tablet by mouth at bedtime.   NovoLOG FlexPen 100 UNIT/ML FlexPen Generic drug: insulin aspart Inject 2-3 Units into the skin 3 (three) times daily with meals. Per sliding scale   ondansetron 4 MG disintegrating tablet Commonly known as: ZOFRAN-ODT 4mg  ODT q4 hours prn nausea/vomit What changed:  how much to take how to take this when to take this reasons to take this additional instructions   pantoprazole 40 MG tablet Commonly  known as: PROTONIX Take 1 tablet (40 mg total) by mouth daily.   senna-docusate 8.6-50 MG tablet Commonly known as: Senokot-S Take 1 tablet by mouth 2 (two) times daily. What changed:  when to take this reasons to take this additional instructions   Tresiba FlexTouch 100 UNIT/ML FlexTouch Pen Generic drug: insulin degludec Inject 5 Units into the skin 2 (two) times daily.               Durable Medical Equipment  (From admission, onward)           Start     Ordered   05/06/23 1527  For home use only DME Walker rolling  Once       Comments: 5 wheel  Question Answer Comment  Walker: With 5 Inch Wheels   Patient needs a walker to treat with the following condition Weakness      05/06/23 1527             Follow-up Information     Marcine Matar, MD. Schedule an appointment as soon as possible for a visit in 1 week(s).   Specialty: Internal Medicine Contact information: [address] [place] [address] [phone]                 Major procedures and Radiology Reports - PLEASE review detailed and final reports thoroughly  -      CT HIP RIGHT WO CONTRAST  Result Date: 05/07/2023 CLINICAL DATA:  Hip pain, stress fracture suspected, neg xray EXAM: CT OF THE RIGHT HIP WITHOUT CONTRAST TECHNIQUE: Multidetector CT imaging of the right hip was performed according to the standard protocol. Multiplanar CT image reconstructions were also generated. RADIATION DOSE REDUCTION: This exam was performed according to the departmental dose-optimization program which includes automated exposure control, adjustment of the mA and/or kV according to patient size and/or use of iterative reconstruction technique. COMPARISON:  X-ray 04/28/2023 FINDINGS: Bones/Joint/Cartilage No acute fracture.  No dislocation. No evidence of femoral head avascular necrosis. Mild osteoarthritis of the right hip with joint space narrowing. Small hip joint effusion, nonspecific.  Included portion of the right hemipelvis appears intact without evidence of fracture or diastasis. No suspicious lytic or sclerotic bone lesion. Ligaments Suboptimally assessed by CT. Muscles and Tendons No acute musculotendinous abnormality by CT. Soft tissues Diffuse body wall edema. Small hyperattenuating area within the deep subcutaneous soft tissues overlying the distal right gluteus maximus muscle at the posterolateral aspect of the proximal thigh measuring 1.7 x 1.0 x 1.5 cm (series 9, image 85), likely a small hematoma. Small volume ascites is seen within the pelvis. Severe, extensive, age advanced atherosclerotic calcifications. No right inguinal lymphadenopathy. IMPRESSION: 1. No acute fracture or dislocation of the right hip. 2. Small hematoma within the deep subcutaneous soft tissues overlying the distal right gluteus maximus muscle at the posterolateral aspect of the proximal thigh measuring up to 1.7 cm. 3. Mild osteoarthritis of the right hip. Small hip joint effusion, nonspecific. 4. Diffuse anasarca.  Small volume ascites within the pelvis. 5. Severe age-advanced atherosclerotic calcifications. Electronically Signed   By: Duanne Guess D.O.   On: 05/07/2023 09:21   DG Knee Complete 4 Views Right  Result Date: 05/07/2023 CLINICAL DATA:  37 year old female with history of knee instability. EXAM: RIGHT KNEE - COMPLETE 4+ VIEW COMPARISON:  No priors. FINDINGS: No evidence of fracture, dislocation, or joint effusion. No evidence of arthropathy or other focal bone abnormality. Numerous vascular calcifications. Soft tissues are otherwise unremarkable. IMPRESSION: 1. No acute radiographic abnormality of the right knee. 2. Extensive atherosclerosis. Electronically Signed   By: Trudie Reed M.D.   On: 05/07/2023 09:12   EEG adult  Result Date: 05/04/2023 Charlsie Quest, MD     05/04/2023  9:12 AM Patient Name: Angela [name] MRN: [id number] Epilepsy Attending: Charlsie Quest Referring  Physician/Provider: Caryl Pina, MD Date: 05/04/2023 Duration: 26.01 mins Patient history:  37 y.o. female who presents to the emergency department after she was found unresponsive. EEG to evaluate for seizure. Level of alertness:  lethargic AEDs during EEG study: LEV Technical aspects: This EEG study was done with scalp electrodes positioned according to the 10-20 International system of electrode placement. Electrical activity was reviewed with band pass filter of 1-70Hz , sensitivity of 7 uV/mm, display speed of 76mm/sec with a 60Hz  notched filter applied as appropriate. EEG data were recorded continuously and digitally stored.  Video monitoring was available and reviewed as appropriate. Description: EEG showed continuous generalized 3 to 6 Hz theta-delta slowing. Physiologic photic driving was not seen during photic stimulation.  Hyperventilation was not performed.   ABNORMALITY - Continuous slow, generalized IMPRESSION: This study is suggestive of moderate diffuse encephalopathy, nonspecific etiology. No seizures or epileptiform discharges were seen throughout the recording. Charlsie Quest   CT Head Wo Contrast  Result Date: 05/04/2023 CLINICAL DATA:  Diabetic ketoacidosis, delirium EXAM: CT HEAD WITHOUT CONTRAST TECHNIQUE: Contiguous axial images were obtained from the base of the skull through the vertex without intravenous contrast. RADIATION DOSE REDUCTION: This exam was performed according to the departmental dose-optimization program which includes automated exposure control, adjustment of the mA and/or kV according to patient size and/or use of iterative reconstruction technique. COMPARISON:  None Available. FINDINGS: Brain: Remote infarct within the left caudate head and anterior limb of the left internal capsule noted. No acute intracranial hemorrhage or infarct. No abnormal mass effect or midline shift. No abnormal intra or extra-axial mass lesion or fluid  collection. Ventricular size is normal.  Cerebellum is unremarkable. Vascular: No hyperdense vessel or unexpected calcification. Skull: No acute fracture. Remote small caliber burr hole within the right frontal bone. Sinuses/Orbits: Large mucous retention cyst within the right maxillary sinus and posterior right ethmoid air cell. Remaining paranasal sinuses are clear. Orbits are unremarkable. Other: Mastoid air cells and middle ear cavities are clear. IMPRESSION: 1. No acute intracranial abnormality. 2. Remote infarct within the left caudate head and anterior limb of the left internal capsule. 3. Large mucous retention cyst within the right maxillary sinus and posterior right ethmoid air cell. Electronically Signed   By: Helyn Numbers M.D.   On: 05/04/2023 01:23   DG HIP PORT UNILAT WITH PELVIS 1V RIGHT  Result Date: 04/28/2023 CLINICAL DATA:  Pain after fall EXAM: Pelvis/right hip x-ray, three-view COMPARISON:  None Available. FINDINGS: Osteopenia. No fracture or dislocation. Slight joint space loss of the hips. Scattered diffuse vascular calcifications. Transitional lumbosacral segment. IMPRESSION: Osteopenia.  Mild degenerative change. Electronically Signed   By: Karen Kays M.D.   On: 04/28/2023 15:20    Micro Results    No results found for this or any previous visit (from the past 240 hour(s)).  Today   Subjective    Angela [name] today has no headache,no chest abdominal pain,no new weakness tingling or numbness, feels much better wants to go home today.  Mother bedside agrees.  Wants to take her home after HD.  Objective   Blood pressure (!) 178/97, pulse 79, temperature (!) 97.4 F (36.3 C), temperature source Oral, resp. rate 16, height 5\' 4"  (1.626 m), weight 63.6 kg, SpO2 98 %.   Intake/Output Summary (Last 24 hours) at 05/07/2023 0929 Last data filed at 05/07/2023 0700 Gross per 24 hour  Intake 20 ml  Output 2000 ml  Net -1980 ml    Exam  Awake Alert, No new F.N deficits,    [place].AT,PERRAL Supple Neck,    Symmetrical Chest wall movement, Good air movement bilaterally, CTAB RRR,No Gallops,   +ve B.Sounds, Abd Soft, Non tender,  No Cyanosis, Clubbing or edema    Data Review   Recent Labs  Lab 05/04/23 0030 05/04/23 0053 05/06/23 0350  WBC 8.3  --  5.3  HGB 9.9* 12.9 12.7  HCT 30.8* 38.0 40.0  PLT 139*  --  135*  MCV 82.8  --  82.0  MCH 26.6  --  26.0  MCHC 32.1  --  31.8  RDW 16.4*  --  16.7*  LYMPHSABS 0.9  --   --   MONOABS 0.8  --   --   EOSABS 0.0  --   --   BASOSABS 0.0  --   --     Recent Labs  Lab 05/04/23 0139 05/04/23 0840 05/04/23 1245 05/04/23 1638 05/04/23 2119 05/06/23 0350  NA  --  128* 130* 130* 127* 129*  K  --  4.2 4.7 4.6 4.4 4.2  CL  --  88* 90* 88* 89* 92*  CO2  --  21* 24 25 24 25   ANIONGAP  --  19* 16* 17* 14 12  GLUCOSE  --  278* 128* 104* 167* 249*  BUN  --  67* 68* 70* 67* 37*  CREATININE  --  10.19* 10.14* 10.54* 10.55* 7.07*  ALBUMIN  --   --   --   --   --  2.7*  LATICACIDVEN 1.8  --   --   --   --   --  TSH  --  1.356  --   --   --   --   CALCIUM  --  9.9 10.1 9.8 9.5 8.9    Total Time in preparing paper work, data evaluation and todays exam - 35 minutes  Signature  -    Susa Raring M.D on 05/07/2023 at 9:29 AM   -  To page go to [email or web address]

## 2023-05-06 NOTE — TOC Progression Note (Signed)
Transition of Care [facility]) - Progression Note    Patient Details  Name: Angela [name] MRN: [id number] Date of Birth: [birth year]  Transition of Care [facility]) CM/SW Contact  Lawerance Sabal, RN Phone Number: 05/06/2023, 4:14 PM  Clinical Narrative:     RW to be delivered to the room through [facility] today  Expected Discharge Plan: Home/Self Care Barriers to Discharge: Continued Medical Work up  Expected Discharge Plan and Services       Living arrangements for the past 2 months: Single Family Home Expected Discharge Date: 05/06/23               DME Arranged: Dan Humphreys rolling DME Agency: [employer] Date DME Agency Contacted: 05/06/23 Time DME Agency Contacted: [phone] Representative spoke with at DME Agency: Vaughan Basta             Social Determinants of Health (SDOH) Interventions SDOH Screenings   Food Insecurity: No Food Insecurity (01/25/2022)  Housing: High Risk (01/06/2022)  Transportation Needs: No Transportation Needs (01/24/2022)  Depression (PHQ2-9): High Risk (03/07/2023)  Financial Resource Strain: Medium Risk (01/20/2022)  Physical Activity: Inactive (10/29/2019)  Tobacco Use: Medium Risk (05/04/2023)    Readmission Risk Interventions    10/18/2021    1:09 PM  Readmission Risk Prevention Plan  Transportation Screening Complete  PCP or Specialist Appt within 3-5 Days Complete  HRI or Home Care Consult Complete  Social Work Consult for Recovery Care Planning/Counseling Complete  Palliative Care Screening Complete  Medication Review Oceanographer) Referral to Pharmacy

## 2023-05-06 NOTE — Progress Notes (Signed)
PT Cancellation Note  Patient Details Name: Angela [name] MRN: [id number] DOB: October 16, 1986   Cancelled Treatment:    Reason Eval/Treat Not Completed: Patient at procedure or test/unavailable. Pt leaving unit for HD. Will check back as schedule allows to initiate PT evaluation.    Marylynn Pearson 05/06/2023, 08:30 AM

## 2023-05-06 NOTE — Plan of Care (Signed)
  Problem: Education: Goal: Ability to describe self-care measures that may prevent or decrease complications (Diabetes Survival Skills Education) will improve Outcome: Progressing   Problem: Coping: Goal: Ability to adjust to condition or change in health will improve Outcome: Progressing   Problem: Fluid Volume: Goal: Ability to maintain a balanced intake and output will improve Outcome: Progressing   Problem: Metabolic: Goal: Ability to maintain appropriate glucose levels will improve Outcome: Progressing   Problem: Education: Goal: Ability to describe self-care measures that may prevent or decrease complications (Diabetes Survival Skills Education) will improve Outcome: Not Progressing   Problem: Cardiac: Goal: Ability to maintain an adequate cardiac output will improve Outcome: Progressing   Problem: Fluid Volume: Goal: Ability to achieve a balanced intake and output will improve Outcome: Progressing

## 2023-05-07 ENCOUNTER — Inpatient Hospital Stay (HOSPITAL_COMMUNITY): Payer: Medicaid Other

## 2023-05-07 LAB — GLUCOSE, CAPILLARY
Glucose-Capillary: 232 mg/dL — ABNORMAL HIGH (ref 70–99)
Glucose-Capillary: 323 mg/dL — ABNORMAL HIGH (ref 70–99)

## 2023-05-07 MED ORDER — ALTEPLASE 2 MG IJ SOLR: 2.0000 mg | Freq: Once | INTRAMUSCULAR | Status: DC | PRN

## 2023-05-07 MED ORDER — ORAL CARE MOUTH RINSE: 15.0000 mL | OROMUCOSAL | Status: DC | PRN

## 2023-05-07 MED ORDER — HEPARIN SODIUM (PORCINE) 1000 UNIT/ML DIALYSIS: 1000.0000 [IU] | INTRAMUSCULAR | Status: DC | PRN

## 2023-05-07 MED ORDER — ANTICOAGULANT SODIUM CITRATE 4% (200MG/5ML) IV SOLN: 5.0000 mL | Status: DC | PRN

## 2023-05-07 NOTE — Progress Notes (Signed)
Physical Therapy Treatment Patient Details Name: Angela [name] MRN: [id number] DOB: [birth year] Today's Date: 05/07/2023   History of Present Illness Patient is a 37 y.o.  female admitted with acute metabolic encephalopathy/  unresponsiveness-secondary to DKA/missed HD.  PMH significant for poorly controlled DM-2, ESRD on HD, prior history of hemorrhagic stroke, h/o seizures    PT Comments    Continuing work on functional mobility and activity tolerance;  Session focused on amb and stair training; Angela [name] was just awakened, and was sleepy and moving slowly, but worked hard; able to incr amb distance, and go up and down 2 steps to practice stair negotiation; Plan for Outptp PT follow up   Recommendations for follow up therapy are one component of a multi-disciplinary discharge planning process, led by the attending physician.  Recommendations may be updated based on patient status, additional functional criteria and insurance authorization.  Follow Up Recommendations       Assistance Recommended at Discharge Frequent or constant Supervision/Assistance (for next 24-48 hours)  Patient can return home with the following A little help with walking and/or transfers;A little help with bathing/dressing/bathroom;Assistance with cooking/housework;Assist for transportation;Help with stairs or ramp for entrance   Equipment Recommendations  Rolling walker (2 wheels)    Recommendations for Other Services       Precautions / Restrictions Precautions Precautions: Fall Restrictions Weight Bearing Restrictions: No     Mobility  Bed Mobility Overal bed mobility: Modified Independent             General bed mobility comments: used bed railing to come to edge of bed    Transfers Overall transfer level: Needs assistance Equipment used: Rolling walker (2 wheels) Transfers: Sit to/from Stand Sit to Stand: Min assist           General transfer comment: Cues for hand placement, and for  brakes use when standing from rollator RW    Ambulation/Gait Ambulation/Gait assistance: Min assist Gait Distance (Feet): 75 Feet Assistive device: Rolling walker (2 wheels) Gait Pattern/deviations: Step-to pattern, Decreased stride length, Antalgic, Decreased stance time - right, Decreased weight shift to right Gait velocity: decreased     General Gait Details: c/o pain right hip   Stairs Stairs: Yes Stairs assistance: Min assist Stair Management: One rail Left, Step to pattern, Forwards Number of Stairs: 2 General stair comments: Cues for sequence, and gave rationale for "up with the good, down with the bad"; Pt lead coming down one step with the LLE instead of R, and this did increase her pain; Pt's mother present for stair training   Wheelchair Mobility    Modified Rankin (Stroke Patients Only)       Balance     Sitting balance-Leahy Scale: Good     Standing balance support: Bilateral upper extremity supported, Reliant on assistive device for balance, During functional activity Standing balance-Leahy Scale: Poor                              Cognition Arousal/Alertness: Awake/alert Behavior During Therapy: WFL for tasks assessed/performed (Sleepy; just awakened) Overall Cognitive Status: Within Functional Limits for tasks assessed                                          Exercises      General Comments General comments (skin integrity, edema, etc.): Discussed options for stairs, and  that pt will get Outpt PT setup [address]      Pertinent Vitals/Pain Pain Assessment Pain Assessment: 0-10 Pain Score: 9  Pain Location: right hip, after amb and stairs; 5/10 at rest Pain Descriptors / Indicators: Discomfort, Grimacing, Guarding Pain Intervention(s): Monitored during session    Home Living                          Prior Function            PT Goals (current goals can now be found in the care plan section)  Acute Rehab PT Goals Patient Stated Goal: find out what is going on with my leg PT Goal Formulation: All assessment and education complete, DC therapy (planning to discharge today) Progress towards PT goals: Progressing toward goals (For dc home today)    Frequency           PT Plan Discharge plan needs to be updated    Co-evaluation              AM-PAC PT "6 Clicks" Mobility   Outcome Measure  Help needed turning from your back to your side while in a flat bed without using bedrails?: A Little Help needed moving from lying on your back to sitting on the side of a flat bed without using bedrails?: A Little Help needed moving to and from a bed to a chair (including a wheelchair)?: A Little Help needed standing up from a chair using your arms (e.g., wheelchair or bedside chair)?: A Little Help needed to walk in hospital room?: A Little Help needed climbing 3-5 steps with a railing? : A Lot 6 Click Score: 17    End of Session Equipment Utilized During Treatment: Gait belt Activity Tolerance: Patient tolerated treatment well Patient left: in bed;with call bell/phone within reach Nurse Communication: Mobility status PT Visit Diagnosis: Unsteadiness on feet (R26.81);Muscle weakness (generalized) (M62.81);Other abnormalities of gait and mobility (R26.89)     Time: 1610-9604 PT Time Calculation (min) (ACUTE ONLY): 46 min  Charges:  $Gait Training: 23-37 mins $Therapeutic Activity: 8-22 mins                     Angela [name], PT  Acute Rehabilitation Services Office [phone] Secure Chat welcomed    Angela [name] 05/07/2023, 4:04 PM

## 2023-05-07 NOTE — TOC Transition Note (Signed)
Transition of Care [facility]) - CM/SW Discharge Note   Patient Details  Name: Angela [name] MRN: [id number] Date of Birth: [birth year]  Transition of Care [facility]) CM/SW Contact:  Lawerance Sabal, RN Phone Number: 05/07/2023, 10:08 AM   Clinical Narrative:     Spoke w patient and mother over the phone. Discussed recommendations from therapies. Explained unable to set up home health due to payor source, however offered OP set up. They state that they were recently active w [facility] [address] and would like referral for PT SLP. Referral [id number] made.  RW delivered to room yesterday No other TOC needs identified for DC   Final next level of care: Home/Self Care Barriers to Discharge: Continued Medical Work up   Patient Goals and CMS Choice      Discharge Placement                         Discharge Plan and Services Additional resources added to the After Visit Summary for                  DME Arranged: Walker rolling DME Agency: [employer] Date DME Agency Contacted: 05/06/23 Time DME Agency Contacted: 1614 Representative spoke with at DME Agency: Vaughan Basta            Social Determinants of Health (SDOH) Interventions SDOH Screenings   Food Insecurity: No Food Insecurity (01/25/2022)  Housing: High Risk (01/06/2022)  Transportation Needs: No Transportation Needs (01/24/2022)  Depression (PHQ2-9): High Risk (03/07/2023)  Financial Resource Strain: Medium Risk (01/20/2022)  Physical Activity: Inactive (10/29/2019)  Tobacco Use: Medium Risk (05/04/2023)     Readmission Risk Interventions    10/18/2021    1:09 PM  Readmission Risk Prevention Plan  Transportation Screening Complete  PCP or Specialist Appt within 3-5 Days Complete  HRI or Home Care Consult Complete  Social Work Consult for Recovery Care Planning/Counseling Complete  Palliative Care Screening Complete  Medication Review Oceanographer) Referral to Pharmacy

## 2023-05-07 NOTE — Progress Notes (Signed)
PT Cancellation Note  Patient Details Name: Angela [name] MRN: [id number] DOB: 01/29/1986   Cancelled Treatment:    Reason Eval/Treat Not Completed: Patient at procedure or test/unavailable; At imaging for her R hip;   Will follow up later today as time allows;  Otherwise, will follow up for PT tomorrow;   Thank you,  [name], PT  Acute Rehabilitation Services Office [phone]    [name] 05/07/2023, 8:47 AM

## 2023-05-07 NOTE — Accreditation Note (Signed)
[facility] Patient Discharge Orders- [facility] CLINIC: [facility]  Patient's name: Angela [name] Admit/DC Dates: 05/04/2023 - 05/07/2023  Discharge Diagnoses: Hyperosmolar nonketotic hyperglycemic state    Acute metabolic encephalopathy=Likely due to hyperosmolar nonketotic hyperglycemic state and missed HD=Improved after dialysis and treatment of hyperglycemic state  Aranesp: Given: no   Date and amount of last dose: 0  Last Hgb: 12.7 PRBC's Given: 0 Date/# of units: 0 ESA dose for discharge: mircera 75 per protocol mcg IV q 2 weeks  IV Iron dose at discharge: 0  Heparin change: no  EDW Change: no New EDW:   Bath Change: no  Access intervention/Change: no Details:  Hectorol/Calcitriol change: no  Discharge Labs: Calcium8.9 Phosphorus 5.2 Albumin 2.7 K+ 4.2  IV Antibiotics: no Details:  On Coumadin?: no Last INR: Next INR: Managed By:   OTHER/APPTS/LAB ORDERS:    D/C Meds to be reconciled by nurse after every discharge.  Completed By: Lenny Pastel PA-C 05/07/2023, 1:40 PM  [facility] Pager: [phone]  Reviewed by: MD:______ RN_______

## 2023-05-07 NOTE — Progress Notes (Signed)
Subjective: Seen in room with mom at bedside,  no current complaints, said tolerated dialysis yesterday, next dialysis tomorrow on schedule.  Smiling  MS at baseline, noted for discharge today  Objective Vital signs in last 24 hours: Vitals:   05/07/23 0800 05/07/23 0846 05/07/23 0900 05/07/23 0945  BP:   (!) 178/97   Pulse:   79   Resp: 10 17 16 11   Temp:      TempSrc:      SpO2:   98%   Weight:      Height:       Weight change:   PPhysical Exam: General: Alert, pleasant chronically ill-appearing young adult female.  NAD  Heart: RRR no MRG Lungs: CTA bilaterally nonlabored breathing Abdomen: NABS, soft NT, nondistended no ascites Extremities: No pedal edema  dialysis Access: LUA AVF+ bruit     Home meds include - norvasc 10, buspar, coreg 6.25 bid, cardizem 60 qid, lexapro, gabapentin, hydralazine 100 tid, insulin aspart/tresiba, fosrenol 1 gm ac, keppra, synthroid, reglan, renavite, protonix, prns/ vits/ supps        [facility] HD: [facility] M[facility] 3.5h  400/800   61.5kg   2/2 bath  AVF   Hep none - last OP HD 5/13, post wt 62.2kg   - mircera 75 mcg IV q 4 wks, last 5/06, due 6/03 - no vdra     Problem/Plan HHS - presenting w/ stupor and BS 710. DKA markers only slightly off.  Was started on IV insulin protocol. AMS - related most likely to #1 and #3,  hyper osmolar nonketotic hyperglycemic state, CT head negative.  Back to baseline now ESRD - on HD MWF. Missed HD Wednesday had dialysis late 5/16 night HD yesterday and then back on schedule MWF.  Monday 5/20 HTN/ volume -admit BPs' up a bit,  under dry wt, no vol excess on exam. 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 Follow.  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 No results for input(s): "AMMONIA" in the last 168 hours. CBC: Recent Labs  Lab 05/04/23 0030 05/04/23 0053 05/06/23 0350  WBC 8.3  --  5.3  NEUTROABS 6.5  --   --   HGB 9.9* 12.9 12.7  HCT 30.8* 38.0 40.0  MCV 82.8  --  82.0  PLT 139*  --  135*   Cardiac Enzymes: No results for input(s): "CKTOTAL", "CKMB", "CKMBINDEX", "TROPONINI" in the last 168 hours. CBG: Recent Labs  Lab 05/05/23 2056 05/06/23 0753 05/06/23 1552 05/06/23 2048 05/07/23 0905  GLUCAP 224* 221* 168* 234* 232*    Studies/Results: CT HIP RIGHT WO CONTRAST  Result Date: 05/07/2023 CLINICAL DATA:  Hip pain, stress fracture suspected, neg xray EXAM: CT OF THE RIGHT HIP WITHOUT CONTRAST TECHNIQUE: Multidetector CT imaging  of the right hip was performed according to the standard protocol. Multiplanar CT image reconstructions were also generated. RADIATION DOSE REDUCTION: This exam was performed according to the departmental dose-optimization program which includes automated exposure control, adjustment of the mA and/or kV according to patient size and/or use of iterative reconstruction technique. COMPARISON:  X-ray 04/28/2023 FINDINGS: Bones/Joint/Cartilage No acute fracture. No dislocation. No evidence of femoral head avascular necrosis. Mild osteoarthritis of the right hip with joint space narrowing. Small hip joint effusion, nonspecific. Included portion of the right hemipelvis appears intact without evidence of fracture or diastasis. No suspicious lytic or sclerotic bone lesion. Ligaments Suboptimally assessed by CT. Muscles and Tendons No acute musculotendinous  abnormality by CT. Soft tissues Diffuse body wall edema. Small hyperattenuating area within the deep subcutaneous soft tissues overlying the distal right gluteus maximus muscle at the posterolateral aspect of the proximal thigh measuring 1.7 x 1.0 x 1.5 cm (series 9, image 85), likely a small hematoma. Small volume ascites is seen within the pelvis. Severe, extensive, age advanced atherosclerotic calcifications. No right inguinal lymphadenopathy. IMPRESSION: 1. No acute fracture or dislocation of the right hip. 2. Small hematoma within the deep subcutaneous soft tissues overlying the distal right gluteus maximus muscle at the posterolateral aspect of the proximal thigh measuring up to 1.7 cm. 3. Mild osteoarthritis of the right hip. Small hip joint effusion, nonspecific. 4. Diffuse anasarca.  Small volume ascites within the pelvis. 5. Severe age-advanced atherosclerotic calcifications. Electronically Signed   By: Duanne Guess D.O.   On: 05/07/2023 09:21   DG Knee Complete 4 Views Right  Result Date: 05/07/2023 CLINICAL DATA:  37 year old female with history of knee instability. EXAM: RIGHT KNEE - COMPLETE 4+ VIEW COMPARISON:  No priors. FINDINGS: No evidence of fracture, dislocation, or joint effusion. No evidence of arthropathy or other focal bone abnormality. Numerous vascular calcifications. Soft tissues are otherwise unremarkable. IMPRESSION: 1. No acute radiographic abnormality of the right knee. 2. Extensive atherosclerosis. Electronically Signed   By: Trudie Reed M.D.   On: 05/07/2023 09:12   Medications:  anticoagulant sodium citrate      amLODipine  10 mg Oral QHS   carvedilol  6.25 mg Oral BID WC   Chlorhexidine Gluconate Cloth  6 each Topical Q0600   gabapentin  100 mg Oral TID   heparin  5,000 Units Subcutaneous Q8H   hydrALAZINE  100 mg Oral Q8H   insulin aspart  0-6 Units Subcutaneous TID WC   insulin glargine-yfgn  5 Units Subcutaneous BID   levETIRAcetam  500 mg Oral BID    levothyroxine  200 mcg Oral QAC breakfast   pantoprazole  40 mg Oral Daily

## 2023-05-07 NOTE — Progress Notes (Signed)
AVS printed and reviewed with patient and her mother. Opportunities provided for questions or concerns with none remaining at this time. PIV removed. Patient dressed with mother's assistance and wheelchair requested for discharge to main entrance to be driven home by her mother in a personal vehicle.

## 2023-05-08 ENCOUNTER — Telehealth: Payer: Self-pay

## 2023-05-08 ENCOUNTER — Other Ambulatory Visit: Payer: Self-pay | Admitting: Internal Medicine

## 2023-05-08 NOTE — TOC Transition Note (Signed)
Transition of Care - Initial Contact from Inpatient Facility  Date of discharge: 05/07/23 Date of contact: 05/08/23  Method: Phone Spoke to: Patient  Patient contacted to discuss transition of care from recent inpatient hospitalization but she didn't pick up the phone. A voicemail was left to call the [facility] hemodialysis center [phone] for any questions and/or concerns.  Patient will return to her outpatient HD unit on 05/08/23 at [facility]  Salome Holmes, NP

## 2023-05-08 NOTE — Progress Notes (Signed)
Late Entry Note  Pt was d/c yesterday. Contacted [facility] this morning to advise clinic of pt's d/c date and that pt should resume care today.   Olivia Canter Renal Navigator [phone]

## 2023-05-08 NOTE — Transitions of Care (Post Inpatient/ED Visit) (Signed)
05/08/2023  Name: Angela [name] MRN: [id number] DOB: 04-10-1986  Today's TOC FU Call Status: Today's TOC FU Call Status:: Successful TOC FU Call Competed TOC FU Call Complete Date: 05/08/23  Transition Care Management Follow-up Telephone Call Date of Discharge: 05/07/23 Discharge Facility: [facility]) Type of Discharge: Inpatient Admission Primary Inpatient Discharge Diagnosis:: hyperglycemia How have you been since you were released from the hospital?: Better Angela [name] said she went out of town for her father's funeral and left the patient with other family members while she was gone and they did not provide the appropraite care and the patient ended up in the hospital) Any questions or concerns?: No  Items Reviewed: Did you receive and understand the discharge instructions provided?: Yes Medications obtained,verified, and reconciled?: No Medications Not Reviewed Reasons:: Other: (Her mother said she has all meds and manges the meds and she did not have any questions about the med regime. Angela [name] checks the blood sugars with Dexcom or standard glucometer and administers the insulin for her daughter.) Any new allergies since your discharge?: No Dietary orders reviewed?: Yes Type of Diet Ordered:: heart healthy, renal.  1200 ml/day fluid restriction. Do you have support at home?: Yes People in Home: parent(s) Name of Support/Comfort Primary Source: her mother is the primary support  Medications Reviewed Today: Angela [name] said that she has all of the meds and did not have any questions about the med regime and she  did not need to review the med list.  Medications Reviewed Today     Reviewed by Thomasene Mohair CPhT (Pharmacy Technician) on 05/05/23 at 2053  Med List Status: Complete   Medication Order Taking? Sig Documenting Provider Last Dose Status Informant  albuterol (PROVENTIL HFA) 108 (90 Base) MCG/ACT inhaler [id number] Yes Inhale 2 puffs into the lungs every 6 (six) hours  as needed for wheezing or shortness of breath. Marcine Matar, MD Past Week Active Mother           Med Note Alvera Novel, Genoveva Ill   Fri May 05, 2023  8:50 PM)    amLODipine (NORVASC) 10 MG tablet [id number] Yes Take 1 tablet (10 mg total) by mouth at bedtime. Marcine Matar, MD 05/04/2023 Active Mother  busPIRone (BUSPAR) 15 MG tablet [id number] Yes TAKE ONE TABLET BY MOUTH EVERYDAY AT BEDTIME  Patient taking differently: Take 15 mg by mouth at bedtime.   Marcine Matar, MD 05/04/2023 Active Mother  carvedilol (COREG) 6.25 MG tablet [id number] Yes Take 1 tablet (6.25 mg total) by mouth 2 (two) times daily with a meal. Marcine Matar, MD 05/04/2023 2000 Active Mother  Continuous Blood Gluc Sensor (DEXCOM G6 SENSOR) MISC [id number]  APPLY 1 SENSOR EVERY 10 DAYS [provider]  Active Mother           Med Note Ardelia Mems, MELANIE A   Thu Jan 06, 2022  4:22 PM)    Continuous Blood Gluc Sensor (DEXCOM G6 SENSOR) MISC [id number]  SMARTSIG:1 Topical Every 10 Days [provider]  Active Mother           Med Note Ronal Fear Jan 06, 2022  4:22 PM)    Continuous Blood Gluc Transmit Vira Agar G6 TRANSMITTER) MISC [id number]  See admin instructions. [provider]  Active Mother           Med Note Ardelia Mems, Caesar Bookman Jan 06, 2022  4:22 PM)    Darbepoetin Alfa (ARANESP) 100 MCG/0.5ML SOSY injection [id number]  Yes Inject 0.5 mLs (100 mcg total) into the skin every Friday at 6 PM. de Saintclair Halsted, Cortney E, NP 04/28/2023 Active Mother  diltiazem (CARDIZEM) 60 MG tablet [id number] Yes Take 1 tablet (60 mg total) by mouth every 6 (six) hours. Marcine Matar, MD 05/04/2023 Active Mother  escitalopram (LEXAPRO) 20 MG tablet [id number] Yes Take 1 tablet (20 mg total) by mouth daily. Marcine Matar, MD 05/04/2023 Active Mother  gabapentin (NEURONTIN) 100 MG capsule [id number] Yes Take 1 capsule (100 mg total) by mouth 3 (three) times daily. Marcine Matar, MD 05/04/2023  Active Mother  hydrALAZINE (APRESOLINE) 100 MG tablet [id number] Yes Take 1 tablet (100 mg total) by mouth 3 (three) times daily. Marcine Matar, MD 05/04/2023 Active Mother  insulin aspart (NOVOLOG FLEXPEN) 100 UNIT/ML FlexPen [id number] Yes Inject 2-3 Units into the skin 3 (three) times daily with meals. Per sliding scale Marcine Matar, MD 05/04/2023 Active Mother  Insulin Pen Needle (COMFORT EZ PEN NEEDLES) 32G X 4 MM MISC [id number]  USE TO INJECT insulin ONCE DAILY AS DIRECTED Marcine Matar, MD  Active Mother  Insulin Syringe-Needle U-100 (BD INSULIN SYRINGE ULTRAFINE) 31G X 15/64" 1 ML MISC [id number]  Used to give daily insulin injections. Romero Belling, MD  Active Mother  lanthanum Kennis Carina) 1000 MG chewable tablet [id number] Yes Chew 1 tablet (1,000 mg total) by mouth with breakfast, with lunch, and with evening meal. Marcine Matar, MD 05/04/2023 Active Mother  levETIRAcetam (KEPPRA) 500 MG tablet [id number] Yes Take 1 tablet (500 mg total) by mouth 2 (two) times daily. Marcine Matar, MD 05/04/2023 Active Mother  levothyroxine (SYNTHROID) 200 MCG tablet [id number] Yes Take 1 tablet (200 mcg total) by mouth daily before breakfast. Stop the 175 mcg dose Marcine Matar, MD 05/04/2023 Active Mother  lidocaine (LIDODERM) 5 % [id number] Yes Place 1 patch onto the skin daily. Remove & Discard patch within 12 hours or as directed by MD  Patient taking differently: Place 1 patch onto the skin daily as needed (For pain).   Setzer, Lynnell Jude, PA-C Past Week Active Mother  metoCLOPramide (REGLAN) 5 MG tablet [id number] Yes Take 1 tablet (5 mg total) by mouth 2 (two) times daily with a meal. Marcine Matar, MD 05/04/2023 Active Mother  multivitamin (RENA-VIT) TABS tablet [id number] Yes Take 1 tablet by mouth at bedtime. [provider] 05/04/2023 Active Mother  ondansetron (ZOFRAN-ODT) 4 MG disintegrating tablet [id number] Yes 4mg  ODT q4 hours prn nausea/vomit  Patient taking  differently: Take 4 mg by mouth every 8 (eight) hours as needed for vomiting or nausea.   Marcine Matar, MD Past Week Active Mother  pantoprazole (PROTONIX) 40 MG tablet [id number] Yes Take 1 tablet (40 mg total) by mouth daily. Marcine Matar, MD 05/04/2023 Active Mother  senna-docusate (SENOKOT-S) 8.6-50 MG tablet [id number] Yes Take 1 tablet by mouth 2 (two) times daily.  Patient taking differently: Take 1 tablet by mouth at bedtime as needed for mild constipation. As needed   Carlos Levering Past Week Active Mother  TRESIBA FLEXTOUCH 100 UNIT/ML FlexTouch Pen [id number] Yes Inject 5 Units into the skin 2 (two) times daily. Marcine Matar, MD 05/04/2023 Active Mother            Home Care and Equipment/Supplies: Were Home Health Services Ordered?: No (The plan is to attend outpatient PT/ST) Any new equipment or medical supplies ordered?: Yes Name of Medical supply agency?: [employer] Were you able to get the  equipment/medical supplies?: Yes Do you have any questions related to the use of the equipment/supplies?: No  Functional Questionnaire: Do you need assistance with bathing/showering or dressing?: Yes (Her mother said she is " semi- independent" and she mother assists if needed) Do you need assistance with meal preparation?: Yes (family prepares meals) Do you need assistance with eating?: No Do you have difficulty maintaining continence: No Do you need assistance with getting out of bed/getting out of a chair/moving?: Yes (She has a RW and needs reminders to use it.) Do you have difficulty managing or taking your medications?: Yes (her mother manages the medication regime.  She has a home BP monitor)  Follow up appointments reviewed: PCP Follow-up appointment confirmed?: Yes Date of PCP follow-up appointment?: 05/16/23 Follow-up Provider: Gwinda Passe, NP.   Angela [name] wanted her daughter to be seen as soon as possible.  Dr Laural Benes did not have an appointment  available until the end of June on non-dialysis days  and Angela [name] was willing to take her to another clinic. Specialist Hospital Follow-up appointment confirmed?: Yes Date of Specialist follow-up appointment?: 05/09/23 Follow-Up Specialty Provider:: neurology.  she atteneds dialysis M/W/F Do you need transportation to your follow-up appointment?: No (Her mother drives her. Angela [name] sid she to convince her daughtetr to use transportation provided by her insurance; but  she refused.) Do you understand care options if your condition(s) worsen?: Yes-patient verbalized understanding    SIGNATURE  Robyne Peers, RN

## 2023-05-09 ENCOUNTER — Telehealth: Payer: Self-pay

## 2023-05-09 ENCOUNTER — Ambulatory Visit: Payer: Medicaid Other | Admitting: Neurology

## 2023-05-09 ENCOUNTER — Encounter: Payer: Self-pay | Admitting: Neurology

## 2023-05-09 NOTE — Telephone Encounter (Signed)
Requested medication (s) are due for refill today - yes  Requested medication (s) are on the active medication list -yes  Future visit scheduled -yes  Last refill: 02/10/23  4.36ml  Notes to clinic: off protocol- provider review   Requested Prescriptions  Pending Prescriptions Disp Refills   ARANESP, ALBUMIN FREE, 100 MCG/0.5ML SOSY injection [Pharmacy Med Name: Aranesp 100 mcg/0.5 mL (in polysorbate) injection syringe] 4 mL 0    Sig: Inject 0.5 mLs (100 mcg total) into the skin every Friday at 6 PM.     Off-Protocol Failed - 05/08/2023  2:12 PM      Failed - Medication not assigned to a protocol, review manually.      Passed - Valid encounter within last 12 months    Recent Outpatient Visits           2 months ago Hospital discharge follow-up   [facility] Marcine Matar, MD   6 months ago Multiple thyroid nodules   [facility] Marcine Matar, MD   1 year ago Hospital discharge follow-up   [facility] Marcine Matar, MD   1 year ago Bronchitis with asthma, acute   [facility] Marcine Matar, MD   2 years ago Onycholysis   [facility] [place], [place], [place]       Future Appointments             In 1 week Grayce Sessions, NP [facility]   In 2 weeks Anders Simmonds, PA-C [facility]               Requested Prescriptions  Pending Prescriptions Disp Refills   ARANESP, ALBUMIN FREE, 100 MCG/0.5ML SOSY injection [Pharmacy Med Name: Aranesp 100 mcg/0.5 mL (in polysorbate) injection syringe] 4 mL 0    Sig: Inject 0.5 mLs (100 mcg total) into the skin every Friday at 6 PM.     Off-Protocol Failed - 05/08/2023  2:12 PM      Failed - Medication not assigned to a protocol, review manually.      Passed - Valid  encounter within last 12 months    Recent Outpatient Visits           2 months ago Hospital discharge follow-up   [facility] Marcine Matar, MD   6 months ago Multiple thyroid nodules   [facility] Marcine Matar, MD   1 year ago Hospital discharge follow-up   [facility] Marcine Matar, MD   1 year ago Bronchitis with asthma, acute   [facility] Marcine Matar, MD   2 years ago Onycholysis   [facility] [facility] [place], [place]zella [place], [place]       Future Appointments             In 1 week Randa Evens, Kinnie Scales, NP [facility]   In 2 weeks Sharon Seller, Marzella Schlein, PA-C  [facility]

## 2023-05-09 NOTE — Telephone Encounter (Signed)
After speaking with Dr Laural Benes and making some changes with the schedule,  I called patient's mother, [name], and informed her that Dr Laural Benes, would like to see the patient instead of a covering provider.  I scheduled her with Dr Laural Benes for 05/11/2023 @ 1050 and told her that I will cancel the other appointments with Ms Donette Larry, PA and Ms Randa Evens, NP.  She was very Adult nurse.

## 2023-05-11 ENCOUNTER — Ambulatory Visit: Payer: Medicaid Other | Admitting: Internal Medicine

## 2023-05-11 ENCOUNTER — Ambulatory Visit: Payer: Medicaid Other | Attending: Internal Medicine | Admitting: Internal Medicine

## 2023-05-11 ENCOUNTER — Encounter: Payer: Self-pay | Admitting: Internal Medicine

## 2023-05-11 VITALS — BP 114/71 | Temp 98.1°F | Ht 64.0 in | Wt 145.0 lb

## 2023-05-11 DIAGNOSIS — I120 Hypertensive chronic kidney disease with stage 5 chronic kidney disease or end stage renal disease: Secondary | ICD-10-CM

## 2023-05-11 DIAGNOSIS — M25551 Pain in right hip: Secondary | ICD-10-CM

## 2023-05-11 DIAGNOSIS — Z992 Dependence on renal dialysis: Secondary | ICD-10-CM

## 2023-05-11 DIAGNOSIS — E103513 Type 1 diabetes mellitus with proliferative diabetic retinopathy with macular edema, bilateral: Secondary | ICD-10-CM | POA: Insufficient documentation

## 2023-05-11 DIAGNOSIS — F331 Major depressive disorder, recurrent, moderate: Secondary | ICD-10-CM | POA: Diagnosis not present

## 2023-05-11 DIAGNOSIS — N186 End stage renal disease: Secondary | ICD-10-CM

## 2023-05-11 DIAGNOSIS — Z09 Encounter for follow-up examination after completed treatment for conditions other than malignant neoplasm: Secondary | ICD-10-CM | POA: Diagnosis not present

## 2023-05-11 DIAGNOSIS — R7989 Other specified abnormal findings of blood chemistry: Secondary | ICD-10-CM

## 2023-05-11 DIAGNOSIS — E1029 Type 1 diabetes mellitus with other diabetic kidney complication: Secondary | ICD-10-CM

## 2023-05-11 DIAGNOSIS — D631 Anemia in chronic kidney disease: Secondary | ICD-10-CM

## 2023-05-11 DIAGNOSIS — R590 Localized enlarged lymph nodes: Secondary | ICD-10-CM

## 2023-05-11 LAB — POCT GLYCOSYLATED HEMOGLOBIN (HGB A1C): HbA1c, POC (controlled diabetic range): 94 % — AB (ref 0.0–7.0)

## 2023-05-11 LAB — GLUCOSE, POCT (MANUAL RESULT ENTRY): POC Glucose: 187 mg/dl — AB (ref 70–99)
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
## 2023-05-11 NOTE — Patient Instructions (Signed)
[facility] [address]  [address] [place], [place] [address] Phone: [phone] Fax: [phone]

## 2023-05-11 NOTE — Addendum Note (Signed)
Addended by: Jonah Blue B on: 05/11/2023 03:40 PM   Modules accepted: Level of Service

## 2023-05-12 LAB — HEPATIC FUNCTION PANEL
ALT: 28 IU/L (ref 0–32)
AST: 32 IU/L (ref 0–40)
Albumin: 4.2 g/dL (ref 3.9–4.9)
Alkaline Phosphatase: 285 IU/L — ABNORMAL HIGH (ref 44–121)
Bilirubin Total: 0.4 mg/dL (ref 0.0–1.2)
Bilirubin, Direct: 0.19 mg/dL (ref 0.00–0.40)
Total Protein: 6.5 g/dL (ref 6.0–8.5)

## 2023-05-16 ENCOUNTER — Inpatient Hospital Stay (INDEPENDENT_AMBULATORY_CARE_PROVIDER_SITE_OTHER): Payer: Medicaid Other | Admitting: Primary Care

## 2023-05-18 ENCOUNTER — Ambulatory Visit
Admission: RE | Admit: 2023-05-18 | Discharge: 2023-05-18 | Disposition: A | Discharge status: Home / Self Care | Payer: Medicaid Other | Source: Ambulatory Visit | Attending: Internal Medicine | Admitting: Internal Medicine

## 2023-05-18 DIAGNOSIS — R7989 Other specified abnormal findings of blood chemistry: Secondary | ICD-10-CM

## 2023-05-24 ENCOUNTER — Telehealth: Payer: Self-pay | Admitting: Internal Medicine

## 2023-05-24 NOTE — Telephone Encounter (Signed)
-----   Message from Johna Roles, [place] sent at 05/24/2023 12:02 PM EDT ----- Called & spoke to [facility]. [facility] has called the patient 2x and the patient never returned the calls to schedule an appointment.   ----- Message ----- From: Marcine Matar, MD Sent: 05/11/2023   3:38 PM EDT To: Johna Roles, CMA  Please call [place] and inquire about whether they have scheduled patient for CT scan of the neck as yet.  This was ordered from a few months ago and has not been done.

## 2023-05-24 NOTE — Telephone Encounter (Signed)
-----   Message from Clarisa N Alarcon, CMA sent at 05/24/2023 12:02 PM EDT ----- Called & spoke to [facility]. [facility] has called the patient 2x and the patient never returned the calls to schedule an appointment.   ----- Message ----- From: Ammi Hutt B, MD Sent: 05/11/2023   3:38 PM EDT To: Clarisa N Alarcon, CMA  Please call [facility] and inquire about whether they have scheduled patient for CT scan of the neck as yet.  This was ordered from a few months ago and has not been done.   

## 2023-05-25 ENCOUNTER — Ambulatory Visit: Payer: Medicaid Other | Admitting: Physician Assistant

## 2023-05-30 ENCOUNTER — Encounter: Payer: Self-pay | Admitting: Neurology

## 2023-05-30 ENCOUNTER — Encounter: Payer: Self-pay | Admitting: Internal Medicine

## 2023-05-30 ENCOUNTER — Ambulatory Visit: Payer: Medicaid Other | Attending: Physician Assistant | Admitting: Physical Therapy

## 2023-05-30 ENCOUNTER — Other Ambulatory Visit: Payer: Self-pay | Admitting: Internal Medicine

## 2023-05-30 DIAGNOSIS — R2681 Unsteadiness on feet: Secondary | ICD-10-CM | POA: Diagnosis present

## 2023-05-30 DIAGNOSIS — M6281 Muscle weakness (generalized): Secondary | ICD-10-CM | POA: Insufficient documentation

## 2023-05-30 DIAGNOSIS — R2689 Other abnormalities of gait and mobility: Secondary | ICD-10-CM | POA: Diagnosis present

## 2023-05-30 DIAGNOSIS — M79604 Pain in right leg: Secondary | ICD-10-CM | POA: Diagnosis present
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
## 2023-06-01 ENCOUNTER — Encounter: Payer: Self-pay | Admitting: Physical Therapy
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
## 2023-06-05 ENCOUNTER — Other Ambulatory Visit: Payer: Self-pay | Admitting: Internal Medicine

## 2023-06-05 DIAGNOSIS — R4589 Other symptoms and signs involving emotional state: Secondary | ICD-10-CM

## 2023-06-05 DIAGNOSIS — F331 Major depressive disorder, recurrent, moderate: Secondary | ICD-10-CM

## 2023-06-06 ENCOUNTER — Ambulatory Visit: Payer: Medicaid Other | Admitting: Physical Therapy

## 2023-06-06 VITALS — BP 132/73 | HR 81

## 2023-06-06 DIAGNOSIS — M6281 Muscle weakness (generalized): Secondary | ICD-10-CM

## 2023-06-06 DIAGNOSIS — R2689 Other abnormalities of gait and mobility: Secondary | ICD-10-CM

## 2023-06-06 DIAGNOSIS — M79604 Pain in right leg: Secondary | ICD-10-CM
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
## 2023-06-13 ENCOUNTER — Encounter: Payer: Self-pay | Admitting: Physical Therapy

## 2023-06-13 ENCOUNTER — Ambulatory Visit: Payer: Medicaid Other | Admitting: Physical Therapy

## 2023-06-13 VITALS — BP 176/96 | HR 81

## 2023-06-13 DIAGNOSIS — R2689 Other abnormalities of gait and mobility: Secondary | ICD-10-CM

## 2023-06-13 DIAGNOSIS — M6281 Muscle weakness (generalized): Secondary | ICD-10-CM | POA: Diagnosis not present

## 2023-06-13 DIAGNOSIS — R2681 Unsteadiness on feet: Secondary | ICD-10-CM

## 2023-06-13 DIAGNOSIS — M79604 Pain in right leg: Secondary | ICD-10-CM
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
## 2023-06-20 ENCOUNTER — Ambulatory Visit: Payer: MEDICAID | Attending: Physician Assistant | Admitting: Physical Therapy

## 2023-06-20 VITALS — BP 126/71 | HR 77

## 2023-06-20 DIAGNOSIS — M6281 Muscle weakness (generalized): Secondary | ICD-10-CM | POA: Diagnosis present

## 2023-06-20 DIAGNOSIS — M79604 Pain in right leg: Secondary | ICD-10-CM | POA: Diagnosis present

## 2023-06-20 DIAGNOSIS — R2689 Other abnormalities of gait and mobility: Secondary | ICD-10-CM | POA: Diagnosis present

## 2023-06-20 DIAGNOSIS — R2681 Unsteadiness on feet: Secondary | ICD-10-CM | POA: Insufficient documentation
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
## 2023-06-23 ENCOUNTER — Encounter: Payer: Self-pay | Admitting: Internal Medicine

## 2023-06-27 ENCOUNTER — Telehealth: Payer: Self-pay | Admitting: Internal Medicine

## 2023-06-27 ENCOUNTER — Ambulatory Visit: Payer: MEDICAID | Admitting: Physical Therapy

## 2023-06-27 VITALS — BP 122/73 | HR 78

## 2023-06-27 DIAGNOSIS — R2681 Unsteadiness on feet: Secondary | ICD-10-CM

## 2023-06-27 DIAGNOSIS — M79604 Pain in right leg: Secondary | ICD-10-CM

## 2023-06-27 DIAGNOSIS — R2689 Other abnormalities of gait and mobility: Secondary | ICD-10-CM

## 2023-06-27 DIAGNOSIS — M6281 Muscle weakness (generalized): Secondary | ICD-10-CM | POA: Diagnosis not present
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
## 2023-06-27 NOTE — Telephone Encounter (Signed)
-----   Message from Dionne Bucy sent at 06/24/2023  7:17 PM EDT ----- Regarding: RE: GI referral The appointment is with  [facility]   12/05/2023  2:00 PM 12/05/2023  2:30 PM  Andrena Mews, MD Gastroenterology NPI: [id number] [address]&& [place] [address]   Phone: [phone]  ----- Message ----- From: Marcine Matar, MD Sent: 06/23/2023   6:31 PM EDT To: Dionne Bucy Subject: RE: GI referral                                What appt?  I do not see an appt in the system for [facility]. ----- Message ----- From: Dionne Bucy Sent: 06/23/2023   4:25 PM EDT To: Marcine Matar, MD Subject: RE: GI referral                                I Called  [name]  they said  that contacted patient and she is going to keep her appointment    12/05/2023  2:00 PM 12/05/2023  2:30 PM  Providers  Andrena Mews, MD Gastroenterology NPI: [id number] [address]&& [place] [address]   Phone: [phone]  ----- Message ----- From: Marcine Matar, MD Sent: 06/23/2023   1:45 PM EDT To: Dionne Bucy Subject: GI referral                                    Could you please call [facility] and try to get this patient an appointment with them.  They have canceled her appointment twice and refused to schedule thinking that she is plugged in with [facility] gastroenterology.  She had an appointment scheduled in the past with [facility] gastroenterology but she was never seen.  She is not established/plugged in with [facility].  Please make them aware of that so that they can give her an appointment.

## 2023-06-29 ENCOUNTER — Other Ambulatory Visit: Payer: Self-pay | Admitting: Internal Medicine

## 2023-06-29 DIAGNOSIS — F331 Major depressive disorder, recurrent, moderate: Secondary | ICD-10-CM

## 2023-07-04 ENCOUNTER — Ambulatory Visit: Payer: MEDICAID | Admitting: Physical Therapy

## 2023-07-04 ENCOUNTER — Encounter: Payer: Self-pay | Admitting: Internal Medicine

## 2023-07-04 ENCOUNTER — Ambulatory Visit: Payer: MEDICAID | Attending: Internal Medicine | Admitting: Internal Medicine

## 2023-07-04 ENCOUNTER — Other Ambulatory Visit (HOSPITAL_COMMUNITY)
Admission: RE | Admit: 2023-07-04 | Discharge: 2023-07-04 | Disposition: A | Discharge status: Home / Self Care | Payer: MEDICAID | Source: Ambulatory Visit | Attending: Internal Medicine | Admitting: Internal Medicine

## 2023-07-04 VITALS — BP 139/78 | HR 80 | Temp 98.7°F | Ht 64.0 in | Wt 135.0 lb

## 2023-07-04 DIAGNOSIS — M25551 Pain in right hip: Secondary | ICD-10-CM

## 2023-07-04 DIAGNOSIS — I120 Hypertensive chronic kidney disease with stage 5 chronic kidney disease or end stage renal disease: Secondary | ICD-10-CM

## 2023-07-04 DIAGNOSIS — Z124 Encounter for screening for malignant neoplasm of cervix: Secondary | ICD-10-CM

## 2023-07-04 DIAGNOSIS — E1029 Type 1 diabetes mellitus with other diabetic kidney complication: Secondary | ICD-10-CM

## 2023-07-04 DIAGNOSIS — N186 End stage renal disease: Secondary | ICD-10-CM

## 2023-07-04 DIAGNOSIS — N911 Secondary amenorrhea: Secondary | ICD-10-CM | POA: Diagnosis not present

## 2023-07-04 DIAGNOSIS — Z992 Dependence on renal dialysis: Secondary | ICD-10-CM

## 2023-07-04 MED ORDER — LANTHANUM CARBONATE 1000 MG PO CHEW
1000.0000 mg | CHEWABLE_TABLET | Freq: Three times a day (TID) | ORAL | 2 refills | Status: AC
Start: 2023-07-04 — End: ?
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
## 2023-07-05 ENCOUNTER — Telehealth: Payer: Self-pay | Admitting: Internal Medicine

## 2023-07-05 DIAGNOSIS — E221 Hyperprolactinemia: Secondary | ICD-10-CM

## 2023-07-05 DIAGNOSIS — R7989 Other specified abnormal findings of blood chemistry: Secondary | ICD-10-CM

## 2023-07-05 DIAGNOSIS — E039 Hypothyroidism, unspecified: Secondary | ICD-10-CM

## 2023-07-05 DIAGNOSIS — N911 Secondary amenorrhea: Secondary | ICD-10-CM

## 2023-07-05 DIAGNOSIS — R932 Abnormal findings on diagnostic imaging of liver and biliary tract: Secondary | ICD-10-CM

## 2023-07-05 NOTE — Telephone Encounter (Signed)
Phone call placed to patient and mother this morning to go over lab results. I reached her mother [name] and she was able to do speaker phone conversation. Informed them that patient's thyroid level was off.  I inquired whether patient has been taking the levothyroxine consistently.  Mother stated that [facility].  Mother fills a 1 month pillbox for her.  However she does not put the levothyroxine in the pillbox.  She has a left that on the side of the patient's bed since she has to take that first thing in the mornings before her other medications.  She reports that patient sometimes gets confused about whether she has taken it or not.  [name] stated that she will start giving it to her every morning herself.  Informed of significantly elevated prolactin level.  I went over things that can cause elevated prolactin level including pregnancy, pituitary tumor known as prolactinoma, untreated/undiagnosed hypothyroidism, various medications.  Patient is on Lexapro and metoclopramide both of which can cause elevated prolactin levels.  Pt's mother confirms there is no concern for pregnancy.  Advised that after taking levothyroxine consistently for 3 weeks, patient returns to the lab to have her thyroid and prolactin levels rechecked.  Advised to hold metoclopramide for 5 to 7 days prior to lab test and Lexapro for 3 days prior to the lab test.  If on recheck, thyroid level has normalized but prolactin level remains significantly elevated, then we will need to get an MRI of the head and have her follow-up with her endocrinologist.  Mother expressed understanding of the plan.  She did write down instructions.  All questions were answered.  I also informed her about what I found out in regards to the gastroenterology referral.  Mother states that new patient appointment is set up with [facility] gastroenterology for December but they were really trying to  get the patient in with [facility] as it would be more convenient for her to be seen in [place] that having to drive to [place].  Patient gets very drained and having to make that drive with her mother.  Mother stated that she tried to explain this to [facility] gastroenterology but they still did not give.  I told her I will touch base with our referral coordinator again to try to see if we can get her with [place] canceled the appointment with [facility].

## 2023-07-06 LAB — CYTOLOGY - PAP
Comment: NEGATIVE
Diagnosis: NEGATIVE
High risk HPV: NEGATIVE

## 2023-07-06 LAB — CERVICOVAGINAL ANCILLARY ONLY
Bacterial Vaginitis (gardnerella): POSITIVE — AB
Candida Glabrata: NEGATIVE
Candida Vaginitis: NEGATIVE
Chlamydia: NEGATIVE
Comment: NEGATIVE
Comment: NEGATIVE
Comment: NEGATIVE
Comment: NEGATIVE
Comment: NEGATIVE
Comment: NORMAL
Neisseria Gonorrhea: NEGATIVE
Trichomonas: NEGATIVE

## 2023-07-11 ENCOUNTER — Encounter: Payer: Self-pay | Admitting: Physical Therapy

## 2023-07-11 ENCOUNTER — Ambulatory Visit: Payer: MEDICAID | Admitting: Physical Therapy

## 2023-07-11 VITALS — BP 141/78 | HR 79

## 2023-07-11 DIAGNOSIS — R2689 Other abnormalities of gait and mobility: Secondary | ICD-10-CM

## 2023-07-11 DIAGNOSIS — M6281 Muscle weakness (generalized): Secondary | ICD-10-CM

## 2023-07-11 DIAGNOSIS — R2681 Unsteadiness on feet: Secondary | ICD-10-CM

## 2023-07-11 DIAGNOSIS — M79604 Pain in right leg: Secondary | ICD-10-CM

## 2023-07-11 LAB — FSH/LH
FSH: 6.4 m[IU]/mL
LH: 2.3 m[IU]/mL

## 2023-07-11 LAB — PROLACTIN: Prolactin: 1531 ng/mL — ABNORMAL HIGH (ref 4.8–33.4)

## 2023-07-11 LAB — TSH+T4F+T3FREE
Free T4: 1.27 ng/dL (ref 0.82–1.77)
T3, Free: 1.4 pg/mL — ABNORMAL LOW (ref 2.0–4.4)
TSH: 12.9 u[IU]/mL — ABNORMAL HIGH (ref 0.450–4.500)
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
## 2023-07-13 ENCOUNTER — Other Ambulatory Visit: Payer: Self-pay

## 2023-07-13 ENCOUNTER — Ambulatory Visit: Payer: MEDICAID | Admitting: Physician Assistant

## 2023-07-13 DIAGNOSIS — M25551 Pain in right hip: Secondary | ICD-10-CM | POA: Diagnosis not present
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
## 2023-07-17 LAB — HM DIABETES EYE EXAM

## 2023-07-25 ENCOUNTER — Ambulatory Visit: Payer: MEDICAID | Attending: Internal Medicine

## 2023-07-25 DIAGNOSIS — E221 Hyperprolactinemia: Secondary | ICD-10-CM

## 2023-07-25 DIAGNOSIS — E039 Hypothyroidism, unspecified: Secondary | ICD-10-CM

## 2023-07-25 DIAGNOSIS — N911 Secondary amenorrhea: Secondary | ICD-10-CM

## 2023-07-26 ENCOUNTER — Other Ambulatory Visit: Payer: Self-pay | Admitting: Internal Medicine

## 2023-07-26 ENCOUNTER — Telehealth: Payer: Self-pay | Admitting: Internal Medicine

## 2023-07-26 ENCOUNTER — Encounter: Payer: Self-pay | Admitting: Internal Medicine

## 2023-07-26 DIAGNOSIS — E039 Hypothyroidism, unspecified: Secondary | ICD-10-CM

## 2023-07-26 DIAGNOSIS — E221 Hyperprolactinemia: Secondary | ICD-10-CM

## 2023-07-26 MED ORDER — FLUCONAZOLE 150 MG PO TABS: 150.0000 mg | ORAL_TABLET | Freq: Every day | ORAL | 0 refills | Status: DC

## 2023-07-26 NOTE — Telephone Encounter (Signed)
Phone call placed to [facility] this morning.  I spoke with Shanda Bumps.  I requested referral with [facility].  Previous request was denied because provider thought that she was already established with [facility].  I explained to her that patient was never seen by [facility].  I had referred her but she was never seen.  They did however give her an appointment but it is December of this year.  Patient is on dialysis.  She lives in [place].  Patient and her mother would like for her to get established with gastroenterology locally rather than her having to go to [facility].  Shanda Bumps states she will have it reviewed by a provider and will call the patient if the referral is approved.

## 2023-07-26 NOTE — Telephone Encounter (Signed)
Dr. Laural Benes called to follow up on request, she stated the patient was never actually seen with [facility] and would like to have the patient established with the [facility]. Please advise on further recommendations.   Thank you

## 2023-07-28 NOTE — Telephone Encounter (Signed)
OK to schedule in APP clinic, first available RG

## 2023-07-31 ENCOUNTER — Telehealth: Payer: Self-pay | Admitting: *Deleted

## 2023-07-31 NOTE — Patient Outreach (Signed)
  Care Management   Note  07/31/2023 Name: [name] MRN: [id number] DOB: January 10, 1986  Angela [name] is enrolled in a Managed Medicaid plan: No. Outreach attempt today was unsuccessful.    RNCM attempting to reach patient to provide information regarding [facility] Tailored Health Plan. A detailed HIPAA compliant message was left, including specific information to contact [facility] Tailored Plan for case management.   [facility] [place] [place])  Member Services: [phone] Behavioral Health Crisis Line: [phone]   Estanislado Emms RN, BSN [place]  [facility] RN Care Coordinator [phone]

## 2023-08-01 ENCOUNTER — Ambulatory Visit: Payer: MEDICAID | Attending: Physician Assistant

## 2023-08-01 ENCOUNTER — Encounter: Payer: Self-pay | Admitting: Neurology

## 2023-08-01 ENCOUNTER — Ambulatory Visit (INDEPENDENT_AMBULATORY_CARE_PROVIDER_SITE_OTHER): Payer: MEDICAID | Admitting: Neurology

## 2023-08-01 VITALS — BP 131/80 | HR 84 | Ht 64.0 in | Wt 140.0 lb

## 2023-08-01 DIAGNOSIS — G8929 Other chronic pain: Secondary | ICD-10-CM

## 2023-08-01 DIAGNOSIS — G5621 Lesion of ulnar nerve, right upper limb: Secondary | ICD-10-CM

## 2023-08-01 DIAGNOSIS — R2681 Unsteadiness on feet: Secondary | ICD-10-CM | POA: Diagnosis present

## 2023-08-01 DIAGNOSIS — M542 Cervicalgia: Secondary | ICD-10-CM | POA: Diagnosis not present

## 2023-08-01 DIAGNOSIS — M6281 Muscle weakness (generalized): Secondary | ICD-10-CM | POA: Diagnosis present

## 2023-08-01 DIAGNOSIS — Z79899 Other long term (current) drug therapy: Secondary | ICD-10-CM

## 2023-08-01 DIAGNOSIS — R202 Paresthesia of skin: Secondary | ICD-10-CM

## 2023-08-01 DIAGNOSIS — R2689 Other abnormalities of gait and mobility: Secondary | ICD-10-CM | POA: Insufficient documentation

## 2023-08-01 DIAGNOSIS — R29898 Other symptoms and signs involving the musculoskeletal system: Secondary | ICD-10-CM

## 2023-08-01 DIAGNOSIS — I619 Nontraumatic intracerebral hemorrhage, unspecified: Secondary | ICD-10-CM

## 2023-08-01 DIAGNOSIS — M79604 Pain in right leg: Secondary | ICD-10-CM | POA: Insufficient documentation

## 2023-08-01 DIAGNOSIS — M5412 Radiculopathy, cervical region: Secondary | ICD-10-CM | POA: Diagnosis not present

## 2023-08-01 DIAGNOSIS — R200 Anesthesia of skin: Secondary | ICD-10-CM | POA: Diagnosis not present

## 2023-08-01 DIAGNOSIS — G40909 Epilepsy, unspecified, not intractable, without status epilepticus: Secondary | ICD-10-CM

## 2023-08-01 HISTORY — DX: Nontraumatic intracerebral hemorrhage, unspecified: I61.9

## 2023-08-01 MED ORDER — LEVETIRACETAM 500 MG PO TABS: 500.0000 mg | ORAL_TABLET | Freq: Two times a day (BID) | ORAL | 4 refills | Status: DC
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
## 2023-08-01 NOTE — Progress Notes (Signed)
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 February she is compliant with medications, 6 months she can drive, no headaches, since her stroke the right side of her body is a little weakn and her right arm hurts. Unknwon snoring. Latt time we discussed a possible sleep evaluation. She goes to bed at 6p-7p and she can sleep until the morning, she doesn;t wake up often, not always refereshed, never woken up unable to breathe, memory is fine, no headache or migraines since last seen.   She is having a lot of neck pain, shooting down the arm, digits 4-5, + tinel's sign at right elbow. Wakes up middle of the night with numbness in the right hand. Neck pain radiating into the right arm digits 4-5, has been to pysical therapy, has failed conservative measures, progressive, weakness right arm  2D echo 60 to 65% showed left atrium is moderately dilated LDL 115, continue pravastatin 20 if lipids normalized but goal is less than 70 Hemoglobin A1c 12 goal less than 7 UDS positive for  [facility] Not on any antithrombotic medication due to bleed On 10/07/2021 seizure was triggered by hypoglycemia she is currently on Keppra continue 500 mg twice daily with 250 additional on dialysis days - no seizures, continue, she has had seizures before, continue keppra  Discussed compliance today in detail, will get a keppra level Last seizure over 6 months ago Blood pressure goal less than 160 - today well controlled PCP to monitor AST ALT which was elevated and 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 With 250mg (1/2 tab) supplement after dialysis.    Dispense:  198 tablet    Refill:  4   Orders Placed This Encounter  Procedures   MR CERVICAL SPINE WO CONTRAST   Levetiracetam level   NCV with EMG(electromyography)     Discussed:   Per [employer] statutes, patients with seizures are not allowed to drive until they have been seizure-free for six months.    Use caution when using heavy equipment or power tools. Avoid working on ladders or at heights. Take showers instead of baths. Ensure the water temperature is not too high on the home water heater. Do not go swimming alone. Do not lock yourself in a room alone (i.e. bathroom). When caring for infants or small children, sit down when holding, feeding, or changing them to minimize risk of injury to the child in the event you have a seizure. Maintain good sleep hygiene. Avoid alcohol.    If patient has another seizure, call 911  and bring them back to the ED if: A.  The seizure lasts longer than 5 minutes.      B.  The patient doesn't wake shortly after the seizure or has new problems such as difficulty seeing, speaking or moving following the seizure C.  The patient was injured during the seizure D.  The patient has a temperature over 102 F (39C) E.  The patient vomited  during the seizure and now is having trouble breathing  Per [facility] statutes, patients with seizures are not allowed to drive until they have been seizure-free for six months.  Other recommendations include using caution when using heavy equipment or power tools. Avoid working on ladders or at heights. Take showers instead of baths.  Do not swim alone.  Ensure the water temperature is not too high on the home water heater. Do not go swimming alone. Do not lock yourself in a room alone (i.e. bathroom). When caring for infants or small children, sit down when holding, feeding, or changing them to minimize risk of injury to the child in the event you have a seizure. Maintain good sleep hygiene. Avoid alcohol.  Also recommend adequate sleep, hydration, good diet and minimize stress.  During the Seizure  - First, ensure adequate ventilation and place patients on the floor on their left side  Loosen clothing around the neck and ensure the airway is patent. If the patient is clenching the teeth, do not force the mouth open with any object as this can cause severe damage - Remove all items from the surrounding that can be hazardous. The patient may be oblivious to what's happening and may not even know what he or she is doing. If the patient is confused and wandering, either gently guide him/her away and block access to outside areas - Reassure the individual and be comforting - Call 911. In most cases, the seizure ends before EMS arrives. However, there are cases when seizures may last over 3 to 5 minutes. Or the individual may have developed breathing  difficulties or severe injuries. If a pregnant patient or a person with diabetes develops a seizure, it is prudent to call an ambulance. - Finally, if the patient does not regain full consciousness, then call EMS. Most patients will remain confused for about 45 to 90 minutes after a seizure, so you must use judgment in calling for help. - Avoid restraints but make sure the patient is in a bed with padded side rails - Place the individual in a lateral position with the neck slightly flexed; this will help the saliva drain from the mouth and prevent the tongue from falling backward - Remove all nearby furniture and other hazards from the area - Provide verbal assurance as the individual is regaining consciousness - Provide the patient with privacy if possible - Call for help and start treatment as ordered by the caregiver   fter the Seizure (Postictal Stage)  After a seizure, most patients experience confusion, fatigue, muscle pain and/or a headache. Thus, one should permit the individual to sleep. For the next few days, reassurance is essential. Being calm and helping reorient the person is also of importance.  Most seizures are painless and end spontaneously. Seizures are not harmful to others but can lead to complications such as stress on the lungs, brain and the heart. Individuals with prior lung problems may develop labored breathing and respiratory distress.   Orders Placed This Encounter  Procedures   MR CERVICAL SPINE WO CONTRAST   Levetiracetam level   NCV with EMG(electromyography)   Meds ordered this encounter  Medications   levETIRAcetam (KEPPRA) 500 MG tablet    Sig: Take 1 tablet (500 mg total) by mouth 2 (two) times daily. With 250mg (1/2 tab) supplement after dialysis.    Dispense:  198 tablet    Refill:  4   Discussed: To prevent or relieve headaches, try the following: Cool Compress.  Lie down and place a cool compress on your head.  Avoid headache triggers. If certain  foods or odors seem to have triggered your migraines in the past, avoid them. A headache diary might help you identify triggers.  Include physical activity in your daily routine. Try a daily walk or other moderate aerobic exercise.  Manage stress. Find healthy ways to cope with the stressors, such as delegating tasks on your to-do list.  Practice relaxation techniques. Try deep breathing, yoga, massage and visualization.  Eat regularly. Eating regularly scheduled meals and maintaining a healthy diet might help prevent headaches. Also, drink plenty of fluids.  Follow a regular sleep schedule. Sleep deprivation might contribute to headaches Consider biofeedback. With this mind-body technique, you learn to control certain bodily functions -- such as muscle tension, heart rate and blood pressure -- to prevent headaches or reduce headache pain.    Proceed to emergency room if you experience new or worsening symptoms or symptoms do not resolve, if you have new neurologic symptoms or if headache is severe, or for any concerning symptom.   Provided education and documentation from American headache Society toolbox including articles on: chronic migraine medication overuse headache, chronic migraines, prevention of migraines, behavioral and other nonpharmacologic treatments for headache.  Cced dr Laural Benes on results review, sent a mychart message and forwarded: FYI Dr. Laural Benes her LFTs are very elevated and her platelets are down. I don;t know why so I have to throw this back to you so sorry!  Alkaline Phosphatase 44 - 121 IU/L 675 High     102 R   AST 0 - 40 IU/L 172 High     26 R   ALT 0 - 32 IU/L 176 High         Platelets 126  Doesn't look like they were this bad while inpatient?   Cc: Marcine Matar, MD,    Naomie Dean, MD  [facility] [address] [place], [place] [address]  Phone [phone] Fax [phone]  I spent over 40 minutes of  face-to-face and non-face-to-face time with patient on the  1. Cervical radiculopathy at C8   2. Chronic neck pain with abnormal neurologic examination   3. Numbness and tingling in right hand   4. Hypertensive intracerebral hemorrhage (HCC)   5. Weakness of right hand   6. Weakness of right arm   7. Ulnar neuropathy of right upper extremity   8. Long-term use of high-risk medication   9. Seizure disorder (HCC)     diagnosis.  This included previsit chart review, lab review, study review, order entry, electronic health record documentation, patient education on the different diagnostic and therapeutic options, counseling and coordination of care, risks and benefits of management, compliance, or risk factor reduction

## 2023-08-01 NOTE — Patient Instructions (Signed)
Mri cervical spine Emg/ncs  Electromyoneurogram Electromyoneurogram is a test to check how well your muscles and nerves are working. This procedure includes the combined use of electromyogram (EMG) and nerve conduction study (NCS). EMG is used to evaluate muscles and the nerves that control those muscles. NCS, which is also called electroneurogram, measures how well your nerves conduct electricity. The procedures should be done together to check if your muscles and nerves are healthy. If the results of the tests are abnormal, this may indicate disease or injury, such as a neuromuscular disease or peripheral nerve damage. Tell a health care provider about: Any allergies you have. All medicines you are taking, including vitamins, herbs, eye drops, creams, and over-the-counter medicines. Any bleeding problems you have. Any surgeries you have had. Any medical conditions you have. What are the risks? Generally, this is a safe procedure. However, problems may occur, including: Bleeding or bruising. Infection where the electrodes were inserted. What happens before the test? Medicines Take all of your usually prescribed medications before this testing is performed. Do not stop your blood thinners unless advised by your prescribing physician. General instructions Your health care provider may ask you to warm the limb that will be checked with warm water, hot pack, or wrapping the limb in a blanket. Do not use lotions or creams on the same day that you will be having the procedure. What happens during the test? For EMG  Your health care provider will ask you to stay in a position so that the muscle being studied can be accessed. You will be sitting or lying down. You may be given a medicine to numb the area (local anesthetic) and the skin will be disinfected. A very thin needle that has an electrode will be inserted into your muscle, one muscle at a time. Typically, multiple muscles are evaluated  during a single study. Another small electrode will be placed on your skin near the muscle. Your health care provider will ask you to continue to remain still. The electrodes will record the electrical activity of your muscles. You may see this on a monitor or hear it in the room. After your muscles have been studied at rest, your health care provider will ask you to contract or flex your muscles. The electrodes will record the electrical activity of your muscles. Your health care provider will remove the electrodes and the electrode needle when the procedure is finished. The procedure may vary among health care providers and hospitals. For NCS  An electrode that records your nerve activity (recording electrode) will be placed on your skin by the muscle that is being studied. An electrode that is used as a reference (reference electrode) will be placed near the recording electrode. A paste or gel will be applied to your skin between the recording electrode and the reference electrode. Your nerve will be stimulated with a mild shock. The speed of the nerves and strength of response is recorded by the electrodes. Your health care provider will remove the electrodes and the gel when the procedure is finished. The procedure may vary among health care providers and hospitals. What can I expect after the test? It is up to you to get your test results. Ask your health care provider, or the department that is doing the test, when your results will be ready. Your health care provider may: Give you medicines for any pain. Monitor the insertion sites to make sure that bleeding stops. You should be able to drive yourself to and  from the test. Discomfort can persist for a few hours after the test, but should be better the next day. Contact a health care provider if: You have swelling, redness, or drainage at any of the insertion sites. Summary Electromyoneurogram is a test to check how well your muscles  and nerves are working. If the results of the tests are abnormal, this may indicate disease or injury. This is a safe procedure. However, problems may occur, such as bleeding and infection. Your health care provider will do two tests to complete this procedure. One checks your muscles (EMG) and another checks your nerves (NCS). It is up to you to get your test results. Ask your health care provider, or the department that is doing the test, when your results will be ready. This information is not intended to replace advice given to you by your health care provider. Make sure you discuss any questions you have with your health care provider. Document Revised: 08/18/2021 Document Reviewed: 07/18/2021 Elsevier Patient Education  2024 [employer].

## 2023-08-02 ENCOUNTER — Ambulatory Visit: Payer: MEDICAID

## 2023-08-02 ENCOUNTER — Telehealth: Payer: Self-pay | Admitting: Neurology

## 2023-08-02 NOTE — Telephone Encounter (Signed)
 Sent to GI [phone]

## 2023-08-07 ENCOUNTER — Encounter (HOSPITAL_COMMUNITY): Payer: Self-pay

## 2023-08-07 ENCOUNTER — Emergency Department (HOSPITAL_COMMUNITY): Payer: MEDICAID

## 2023-08-07 ENCOUNTER — Other Ambulatory Visit: Payer: Self-pay

## 2023-08-07 ENCOUNTER — Ambulatory Visit: Payer: Self-pay | Admitting: *Deleted

## 2023-08-07 ENCOUNTER — Encounter: Payer: Self-pay | Admitting: Internal Medicine

## 2023-08-07 ENCOUNTER — Encounter: Payer: Self-pay | Admitting: Gastroenterology

## 2023-08-07 ENCOUNTER — Inpatient Hospital Stay (HOSPITAL_COMMUNITY)
Admission: EM | Admit: 2023-08-07 | Discharge: 2023-08-10 | DRG: 177 | Disposition: A | Discharge status: Home / Self Care | Payer: MEDICAID | Attending: Family Medicine | Admitting: Family Medicine

## 2023-08-07 DIAGNOSIS — Z79890 Hormone replacement therapy (postmenopausal): Secondary | ICD-10-CM | POA: Diagnosis not present

## 2023-08-07 DIAGNOSIS — E1065 Type 1 diabetes mellitus with hyperglycemia: Secondary | ICD-10-CM | POA: Diagnosis present

## 2023-08-07 DIAGNOSIS — I5033 Acute on chronic diastolic (congestive) heart failure: Secondary | ICD-10-CM | POA: Diagnosis present

## 2023-08-07 DIAGNOSIS — G40909 Epilepsy, unspecified, not intractable, without status epilepticus: Secondary | ICD-10-CM | POA: Diagnosis present

## 2023-08-07 DIAGNOSIS — E785 Hyperlipidemia, unspecified: Secondary | ICD-10-CM | POA: Diagnosis present

## 2023-08-07 DIAGNOSIS — K219 Gastro-esophageal reflux disease without esophagitis: Secondary | ICD-10-CM | POA: Diagnosis present

## 2023-08-07 DIAGNOSIS — Z881 Allergy status to other antibiotic agents status: Secondary | ICD-10-CM

## 2023-08-07 DIAGNOSIS — E039 Hypothyroidism, unspecified: Secondary | ICD-10-CM | POA: Diagnosis present

## 2023-08-07 DIAGNOSIS — D631 Anemia in chronic kidney disease: Secondary | ICD-10-CM | POA: Diagnosis present

## 2023-08-07 DIAGNOSIS — J9600 Acute respiratory failure, unspecified whether with hypoxia or hypercapnia: Principal | ICD-10-CM | POA: Diagnosis present

## 2023-08-07 DIAGNOSIS — E1022 Type 1 diabetes mellitus with diabetic chronic kidney disease: Secondary | ICD-10-CM | POA: Diagnosis present

## 2023-08-07 DIAGNOSIS — Z992 Dependence on renal dialysis: Secondary | ICD-10-CM | POA: Diagnosis not present

## 2023-08-07 DIAGNOSIS — J45909 Unspecified asthma, uncomplicated: Secondary | ICD-10-CM | POA: Diagnosis present

## 2023-08-07 DIAGNOSIS — Z823 Family history of stroke: Secondary | ICD-10-CM

## 2023-08-07 DIAGNOSIS — J9601 Acute respiratory failure with hypoxia: Secondary | ICD-10-CM | POA: Diagnosis present

## 2023-08-07 DIAGNOSIS — Z87891 Personal history of nicotine dependence: Secondary | ICD-10-CM

## 2023-08-07 DIAGNOSIS — Z8673 Personal history of transient ischemic attack (TIA), and cerebral infarction without residual deficits: Secondary | ICD-10-CM

## 2023-08-07 DIAGNOSIS — N186 End stage renal disease: Secondary | ICD-10-CM

## 2023-08-07 DIAGNOSIS — E875 Hyperkalemia: Secondary | ICD-10-CM | POA: Diagnosis not present

## 2023-08-07 DIAGNOSIS — E10649 Type 1 diabetes mellitus with hypoglycemia without coma: Secondary | ICD-10-CM | POA: Diagnosis not present

## 2023-08-07 DIAGNOSIS — E1040 Type 1 diabetes mellitus with diabetic neuropathy, unspecified: Secondary | ICD-10-CM | POA: Diagnosis present

## 2023-08-07 DIAGNOSIS — U071 COVID-19: Principal | ICD-10-CM | POA: Diagnosis present

## 2023-08-07 DIAGNOSIS — I132 Hypertensive heart and chronic kidney disease with heart failure and with stage 5 chronic kidney disease, or end stage renal disease: Secondary | ICD-10-CM | POA: Diagnosis present

## 2023-08-07 DIAGNOSIS — I619 Nontraumatic intracerebral hemorrhage, unspecified: Secondary | ICD-10-CM | POA: Diagnosis present

## 2023-08-07 DIAGNOSIS — R569 Unspecified convulsions: Secondary | ICD-10-CM

## 2023-08-07 DIAGNOSIS — N2581 Secondary hyperparathyroidism of renal origin: Secondary | ICD-10-CM | POA: Diagnosis present

## 2023-08-07 DIAGNOSIS — Z6824 Body mass index (BMI) 24.0-24.9, adult: Secondary | ICD-10-CM

## 2023-08-07 DIAGNOSIS — Z83438 Family history of other disorder of lipoprotein metabolism and other lipidemia: Secondary | ICD-10-CM

## 2023-08-07 DIAGNOSIS — G4700 Insomnia, unspecified: Secondary | ICD-10-CM | POA: Diagnosis present

## 2023-08-07 DIAGNOSIS — J1282 Pneumonia due to coronavirus disease 2019: Secondary | ICD-10-CM | POA: Diagnosis present

## 2023-08-07 DIAGNOSIS — Z833 Family history of diabetes mellitus: Secondary | ICD-10-CM

## 2023-08-07 DIAGNOSIS — Z820 Family history of epilepsy and other diseases of the nervous system: Secondary | ICD-10-CM

## 2023-08-07 DIAGNOSIS — K7460 Unspecified cirrhosis of liver: Secondary | ICD-10-CM | POA: Diagnosis present

## 2023-08-07 DIAGNOSIS — M898X9 Other specified disorders of bone, unspecified site: Secondary | ICD-10-CM | POA: Diagnosis present

## 2023-08-07 DIAGNOSIS — Z794 Long term (current) use of insulin: Secondary | ICD-10-CM

## 2023-08-07 DIAGNOSIS — E440 Moderate protein-calorie malnutrition: Secondary | ICD-10-CM | POA: Insufficient documentation

## 2023-08-07 DIAGNOSIS — Z8249 Family history of ischemic heart disease and other diseases of the circulatory system: Secondary | ICD-10-CM

## 2023-08-07 DIAGNOSIS — R0902 Hypoxemia: Principal | ICD-10-CM

## 2023-08-07 DIAGNOSIS — Z79899 Other long term (current) drug therapy: Secondary | ICD-10-CM

## 2023-08-07 DIAGNOSIS — Z888 Allergy status to other drugs, medicaments and biological substances status: Secondary | ICD-10-CM

## 2023-08-07 LAB — CBC
HCT: 29.4 % — ABNORMAL LOW (ref 36.0–46.0)
Hemoglobin: 9.3 g/dL — ABNORMAL LOW (ref 12.0–15.0)
MCH: 27 pg (ref 26.0–34.0)
MCHC: 31.6 g/dL (ref 30.0–36.0)
MCV: 85.2 fL (ref 80.0–100.0)
Platelets: 124 10*3/uL — ABNORMAL LOW (ref 150–400)
RBC: 3.45 MIL/uL — ABNORMAL LOW (ref 3.87–5.11)
RDW: 18.6 % — ABNORMAL HIGH (ref 11.5–15.5)
WBC: 5.4 10*3/uL (ref 4.0–10.5)
nRBC: 0 % (ref 0.0–0.2)

## 2023-08-07 LAB — I-STAT VENOUS BLOOD GAS, ED
Acid-Base Excess: 5 mmol/L — ABNORMAL HIGH (ref 0.0–2.0)
Bicarbonate: 31.1 mmol/L — ABNORMAL HIGH (ref 20.0–28.0)
Calcium, Ion: 1.02 mmol/L — ABNORMAL LOW (ref 1.15–1.40)
HCT: 29 % — ABNORMAL LOW (ref 36.0–46.0)
Hemoglobin: 9.9 g/dL — ABNORMAL LOW (ref 12.0–15.0)
O2 Saturation: 100 %
Potassium: 6.6 mmol/L (ref 3.5–5.1)
Sodium: 127 mmol/L — ABNORMAL LOW (ref 135–145)
TCO2: 33 mmol/L — ABNORMAL HIGH (ref 22–32)
pCO2, Ven: 53.9 mmHg (ref 44–60)
pH, Ven: 7.369 (ref 7.25–7.43)
pO2, Ven: 186 mmHg — ABNORMAL HIGH (ref 32–45)

## 2023-08-07 LAB — COMPREHENSIVE METABOLIC PANEL
ALT: 35 U/L (ref 0–44)
AST: 30 U/L (ref 15–41)
Albumin: 3.4 g/dL — ABNORMAL LOW (ref 3.5–5.0)
Alkaline Phosphatase: 267 U/L — ABNORMAL HIGH (ref 38–126)
Anion gap: 19 — ABNORMAL HIGH (ref 5–15)
BUN: 65 mg/dL — ABNORMAL HIGH (ref 6–20)
CO2: 24 mmol/L (ref 22–32)
Calcium: 8.5 mg/dL — ABNORMAL LOW (ref 8.9–10.3)
Chloride: 88 mmol/L — ABNORMAL LOW (ref 98–111)
Creatinine, Ser: 9.69 mg/dL — ABNORMAL HIGH (ref 0.44–1.00)
GFR, Estimated: 5 mL/min — ABNORMAL LOW (ref >60)
Glucose, Bld: 166 mg/dL — ABNORMAL HIGH (ref 70–99)
Potassium: 5.7 mmol/L — ABNORMAL HIGH (ref 3.5–5.1)
Sodium: 131 mmol/L — ABNORMAL LOW (ref 135–145)
Total Bilirubin: 1.1 mg/dL (ref 0.3–1.2)
Total Protein: 6.4 g/dL — ABNORMAL LOW (ref 6.5–8.1)

## 2023-08-07 LAB — HEPATITIS B SURFACE ANTIGEN: Hepatitis B Surface Ag: NONREACTIVE

## 2023-08-07 LAB — TROPONIN I (HIGH SENSITIVITY)
Troponin I (High Sensitivity): 19 ng/L — ABNORMAL HIGH (ref <18)
Troponin I (High Sensitivity): 17 ng/L (ref <18)

## 2023-08-07 LAB — BASIC METABOLIC PANEL
Anion gap: 17 — ABNORMAL HIGH (ref 5–15)
BUN: 59 mg/dL — ABNORMAL HIGH (ref 6–20)
CO2: 26 mmol/L (ref 22–32)
Calcium: 8.8 mg/dL — ABNORMAL LOW (ref 8.9–10.3)
Chloride: 88 mmol/L — ABNORMAL LOW (ref 98–111)
Creatinine, Ser: 9.45 mg/dL — ABNORMAL HIGH (ref 0.44–1.00)
GFR, Estimated: 5 mL/min — ABNORMAL LOW (ref >60)
Glucose, Bld: 166 mg/dL — ABNORMAL HIGH (ref 70–99)
Potassium: 6 mmol/L — ABNORMAL HIGH (ref 3.5–5.1)
Sodium: 131 mmol/L — ABNORMAL LOW (ref 135–145)

## 2023-08-07 LAB — RESP PANEL BY RT-PCR (RSV, FLU A&B, COVID)  RVPGX2
Influenza A by PCR: NEGATIVE
Influenza B by PCR: NEGATIVE
Resp Syncytial Virus by PCR: NEGATIVE
SARS Coronavirus 2 by RT PCR: POSITIVE — AB

## 2023-08-07 LAB — CBG MONITORING, ED
Glucose-Capillary: 174 mg/dL — ABNORMAL HIGH (ref 70–99)
Glucose-Capillary: 165 mg/dL — ABNORMAL HIGH (ref 70–99)
Glucose-Capillary: 133 mg/dL — ABNORMAL HIGH (ref 70–99)

## 2023-08-07 LAB — CK: Total CK: 120 U/L (ref 38–234)

## 2023-08-07 LAB — HIV ANTIBODY (ROUTINE TESTING W REFLEX): HIV Screen 4th Generation wRfx: NONREACTIVE

## 2023-08-07 LAB — PROCALCITONIN: Procalcitonin: 3.71 ng/mL

## 2023-08-07 LAB — HCG, SERUM, QUALITATIVE: Preg, Serum: NEGATIVE

## 2023-08-07 MED ORDER — LEVOTHYROXINE SODIUM 100 MCG PO TABS: 200.0000 ug | ORAL_TABLET | Freq: Every day | ORAL | Status: DC

## 2023-08-07 MED ORDER — ONDANSETRON HCL 4 MG/2ML IJ SOLN: 4.0000 mg | Freq: Four times a day (QID) | INTRAMUSCULAR | Status: DC | PRN

## 2023-08-07 MED ORDER — METOCLOPRAMIDE HCL 5 MG PO TABS
5.0000 mg | ORAL_TABLET | Freq: Two times a day (BID) | ORAL | Status: DC
  Administered 2023-08-08 – 2023-08-10 (×5): 5 mg via ORAL
  Filled 2023-08-07 (×5): qty 1

## 2023-08-07 MED ORDER — METHYLPREDNISOLONE SODIUM SUCC 40 MG IJ SOLR
0.5000 mg/kg | Freq: Two times a day (BID) | INTRAMUSCULAR | Status: DC
  Administered 2023-08-07 – 2023-08-09 (×5): 31.6 mg via INTRAVENOUS
  Filled 2023-08-07 (×5): qty 1

## 2023-08-07 MED ORDER — FLUCONAZOLE 150 MG PO TABS: 150.0000 mg | ORAL_TABLET | Freq: Every day | ORAL | Status: DC

## 2023-08-07 MED ORDER — SODIUM ZIRCONIUM CYCLOSILICATE 10 G PO PACK
20.0000 g | PACK | Freq: Once | ORAL | Status: AC
  Administered 2023-08-07: 20 g via ORAL
  Filled 2023-08-07: qty 2

## 2023-08-07 MED ORDER — SODIUM CHLORIDE 0.9% FLUSH: 3.0000 mL | INTRAVENOUS | Status: DC | PRN

## 2023-08-07 MED ORDER — SODIUM CHLORIDE 0.9 % IV SOLN
200.0000 mg | Freq: Once | INTRAVENOUS | Status: AC
  Administered 2023-08-07: 200 mg via INTRAVENOUS
  Filled 2023-08-07: qty 40

## 2023-08-07 MED ORDER — SODIUM CHLORIDE 0.9 % IV SOLN
2.0000 g | INTRAVENOUS | Status: DC
  Administered 2023-08-08 – 2023-08-09 (×2): 2 g via INTRAVENOUS
  Filled 2023-08-07 (×3): qty 20

## 2023-08-07 MED ORDER — HYDROCODONE-ACETAMINOPHEN 5-325 MG PO TABS: 1.0000 | ORAL_TABLET | ORAL | Status: DC | PRN

## 2023-08-07 MED ORDER — LEVOTHYROXINE SODIUM 75 MCG PO TABS
175.0000 ug | ORAL_TABLET | Freq: Every day | ORAL | Status: DC
  Administered 2023-08-08 – 2023-08-10 (×3): 175 ug via ORAL
  Filled 2023-08-07 (×3): qty 1

## 2023-08-07 MED ORDER — PANTOPRAZOLE SODIUM 40 MG PO TBEC
40.0000 mg | DELAYED_RELEASE_TABLET | Freq: Every day | ORAL | Status: DC
  Administered 2023-08-08 – 2023-08-10 (×3): 40 mg via ORAL
  Filled 2023-08-07 (×3): qty 1

## 2023-08-07 MED ORDER — INSULIN GLARGINE-YFGN 100 UNIT/ML ~~LOC~~ SOLN
5.0000 [IU] | Freq: Two times a day (BID) | SUBCUTANEOUS | Status: DC
  Administered 2023-08-07 – 2023-08-10 (×6): 5 [IU] via SUBCUTANEOUS
  Filled 2023-08-07 (×7): qty 0.05

## 2023-08-07 MED ORDER — ACETAMINOPHEN 325 MG PO TABS: 650.0000 mg | ORAL_TABLET | Freq: Four times a day (QID) | ORAL | Status: DC | PRN

## 2023-08-07 MED ORDER — SODIUM CHLORIDE 0.9 % IV SOLN
100.0000 mg | Freq: Every day | INTRAVENOUS | Status: AC
  Administered 2023-08-08 – 2023-08-09 (×2): 100 mg via INTRAVENOUS
  Filled 2023-08-07 (×3): qty 20

## 2023-08-07 MED ORDER — SODIUM CHLORIDE 0.9% FLUSH
3.0000 mL | Freq: Two times a day (BID) | INTRAVENOUS | Status: DC
  Administered 2023-08-08 – 2023-08-10 (×5): 3 mL via INTRAVENOUS

## 2023-08-07 MED ORDER — ACETAMINOPHEN 650 MG RE SUPP: 650.0000 mg | Freq: Four times a day (QID) | RECTAL | Status: DC | PRN

## 2023-08-07 MED ORDER — INSULIN ASPART 100 UNIT/ML IJ SOLN
0.0000 [IU] | INTRAMUSCULAR | Status: DC
  Administered 2023-08-07: 2 [IU] via SUBCUTANEOUS
  Administered 2023-08-08 (×3): 3 [IU] via SUBCUTANEOUS
  Administered 2023-08-08: 2 [IU] via SUBCUTANEOUS
  Administered 2023-08-08: 5 [IU] via SUBCUTANEOUS
  Administered 2023-08-09 (×2): 2 [IU] via SUBCUTANEOUS
  Administered 2023-08-09: 7 [IU] via SUBCUTANEOUS
  Administered 2023-08-09: 2 [IU] via SUBCUTANEOUS
  Administered 2023-08-09: 1 [IU] via SUBCUTANEOUS
  Administered 2023-08-10: 7 [IU] via SUBCUTANEOUS
  Administered 2023-08-10: 3 [IU] via SUBCUTANEOUS
  Administered 2023-08-10: 5 [IU] via SUBCUTANEOUS

## 2023-08-07 MED ORDER — LEVETIRACETAM 250 MG PO TABS: 250.0000 mg | ORAL_TABLET | Freq: Every day | ORAL | Status: DC | PRN

## 2023-08-07 MED ORDER — LEVETIRACETAM 500 MG PO TABS
500.0000 mg | ORAL_TABLET | Freq: Two times a day (BID) | ORAL | Status: DC
  Administered 2023-08-07 – 2023-08-10 (×6): 500 mg via ORAL
  Filled 2023-08-07 (×6): qty 1

## 2023-08-07 MED ORDER — CHLORHEXIDINE GLUCONATE CLOTH 2 % EX PADS
6.0000 | MEDICATED_PAD | Freq: Every day | CUTANEOUS | Status: DC
  Administered 2023-08-09 – 2023-08-10 (×2): 6 via TOPICAL

## 2023-08-07 MED ORDER — INSULIN DEGLUDEC 100 UNIT/ML ~~LOC~~ SOPN: 5.0000 [IU] | PEN_INJECTOR | Freq: Two times a day (BID) | SUBCUTANEOUS | Status: DC

## 2023-08-07 MED ORDER — SODIUM CHLORIDE 0.9 % IV SOLN: 250.0000 mL | INTRAVENOUS | Status: DC | PRN

## 2023-08-07 MED ORDER — ESCITALOPRAM OXALATE 10 MG PO TABS: 20.0000 mg | ORAL_TABLET | Freq: Every day | ORAL | Status: DC

## 2023-08-07 MED ORDER — PANTOPRAZOLE SODIUM 40 MG PO TBEC: 40.0000 mg | DELAYED_RELEASE_TABLET | Freq: Every day | ORAL | Status: DC

## 2023-08-07 MED ORDER — SODIUM CHLORIDE 0.9 % IV SOLN
500.0000 mg | INTRAVENOUS | Status: DC
  Administered 2023-08-07: 500 mg via INTRAVENOUS
  Filled 2023-08-07: qty 5

## 2023-08-07 MED ORDER — IOHEXOL 350 MG/ML SOLN
75.0000 mL | Freq: Once | INTRAVENOUS | Status: AC | PRN
  Administered 2023-08-07: 75 mL via INTRAVENOUS

## 2023-08-07 MED ORDER — ESCITALOPRAM OXALATE 10 MG PO TABS
20.0000 mg | ORAL_TABLET | Freq: Every day | ORAL | Status: DC
  Administered 2023-08-08 – 2023-08-10 (×3): 20 mg via ORAL
  Filled 2023-08-07 (×3): qty 2

## 2023-08-07 MED ORDER — PREDNISONE 20 MG PO TABS: 50.0000 mg | ORAL_TABLET | Freq: Every day | ORAL | Status: DC

## 2023-08-07 MED ORDER — ONDANSETRON HCL 4 MG PO TABS: 4.0000 mg | ORAL_TABLET | Freq: Four times a day (QID) | ORAL | Status: DC | PRN

## 2023-08-07 NOTE — Assessment & Plan Note (Signed)
History of seizure disorder continue home medications with Neurontin 100 mg 3 times daily and Keppra 500 mg twice daily

## 2023-08-07 NOTE — Assessment & Plan Note (Signed)
Remote history currently stable

## 2023-08-07 NOTE — Telephone Encounter (Signed)
Noted  
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
## 2023-08-07 NOTE — Subjective & Objective (Signed)
Came in w SOB and fatigue, URI symptoms Found hypoxic down to 83% in the waiting room At home tested positive for COVId At baseline not on O2 Missed her HD this AM

## 2023-08-07 NOTE — Assessment & Plan Note (Signed)
Continue hemodialysis appreciate nephrology consult Patient missed her hemodialysis on Monday nephrology to hemodialyzed at earliest available.

## 2023-08-07 NOTE — Assessment & Plan Note (Addendum)
Lokelma given appreciate nephrology consult Plan to dialyze

## 2023-08-07 NOTE — H&P (Signed)
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 yet assigned)  Question Answer Comment  Place call to: [facility]   Reason for Consult Admit      08/07/23 1808            Consults called: Nephrology Treatment Team:  Bufford Buttner, MD  Admission status:  ED Disposition     ED Disposition  Admit   Condition  --   Comment  Hospital Area: [facility] [[id number]]  Level of Care: Progressive [102]  Admit to Progressive based on following criteria: RESPIRATORY PROBLEMS hypoxemic/hypercapnic  respiratory failure that is responsive to NIPPV  (BiPAP) or High Flow Nasal Cannula (6-80 lpm). Frequent assessment/intervention, no > Q2 hrs < Q4 hrs, to maintain oxygenation and pulmonary hygiene.  May admit patient to [facility] or [facility] if equivalent level of care is available:: No  Covid Evaluation: Confirmed COVID Positive  Diagnosis: Acute respiratory failure (HCC) [518.81.ICD-9-CM]  Admitting Physician: Therisa Doyne [[id number]]  Attending Physician: Therisa Doyne [[id number]]  Certification:: I certify this patient will need inpatient services for at least 2 midnights  Expected Medical Readiness: 08/09/2023            inpatient     I Expect 2 midnight stay secondary to severity of patient's current illness need for inpatient interventions justified by the following:  hemodynamic instability despite optimal treatment (tachycardia   tachypnea hypoxia, )   Severe lab/radiological/exam abnormalities including:   Infiltrates and extensive comorbidities including:  DM1 end-stage renal disease    That are currently affecting medical management.   I expect  patient to be hospitalized for 2 midnights requiring inpatient medical care.  Patient is at high risk for adverse outcome (such as loss of life or disability) if not treated.  Indication for inpatient stay as follows:  Severe change from baseline regarding mental status  New or worsening hypoxia   Need for IV antibiotics,     Level of care      progressive    n   tele indefinitely please discontinue once patient no longer qualifies COVID-19 Labs    Lab Results  Component Value Date   SARSCOV2NAA POSITIVE (A) 08/07/2023     Precautions: admitted as  covid positive No active isolations    Jenefer Woerner 08/07/2023, 9:06 PM    [facility]     after 2 AM please page floor coverage PA If 7AM-7PM, please contact the day team taking care of the patient using [email or web address]

## 2023-08-07 NOTE — Telephone Encounter (Signed)
Summary: COVID Advice   Pts mother is calling to report that the patient tested positive for COVID today. Please advise         Attempted to call patient- no answer- left message to call office

## 2023-08-07 NOTE — Telephone Encounter (Signed)
Second attempt to call patient- no answer- left message to call office. Patient is currently at [facility]- will forward message to PCP since under care presently

## 2023-08-07 NOTE — Assessment & Plan Note (Signed)
Order sliding scale insulin and continue home Tresiba 5 units twice a day

## 2023-08-07 NOTE — ED Notes (Signed)
Pt on 2L 02 per [place]

## 2023-08-07 NOTE — ED Notes (Signed)
Placed pt on 4L 02 per [place]

## 2023-08-07 NOTE — ED Notes (Signed)
Labs obtained and sent.

## 2023-08-07 NOTE — Assessment & Plan Note (Signed)
In the setting of COVID infection and fluid overload Appreciate nephrology consult for hemodialysis continue oxygen as needed Flutter valve ordered
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
## 2023-08-07 NOTE — ED Triage Notes (Signed)
Pt tested positive for COVID today. Pt c/o productive cough w/yellowish/gray mucous, chest painx3d. Pt missed dialysis today

## 2023-08-07 NOTE — Assessment & Plan Note (Signed)
-   Check TSH continue home medications Synthroid at po q day

## 2023-08-07 NOTE — Telephone Encounter (Signed)
Called patient to schedule left voicemail. 

## 2023-08-07 NOTE — Assessment & Plan Note (Addendum)
Noted evidence of hypoxia but open patient also has underlying fluid overload in the setting of missing hemodialysis. 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

## 2023-08-08 DIAGNOSIS — J9601 Acute respiratory failure with hypoxia: Secondary | ICD-10-CM | POA: Diagnosis not present

## 2023-08-08 LAB — COMPREHENSIVE METABOLIC PANEL
ALT: 36 U/L (ref 0–44)
ALT: 37 U/L (ref 0–44)
AST: 27 U/L (ref 15–41)
AST: 24 U/L (ref 15–41)
Albumin: 3.6 g/dL (ref 3.5–5.0)
Albumin: 3.6 g/dL (ref 3.5–5.0)
Alkaline Phosphatase: 282 U/L — ABNORMAL HIGH (ref 38–126)
Alkaline Phosphatase: 281 U/L — ABNORMAL HIGH (ref 38–126)
Anion gap: 18 — ABNORMAL HIGH (ref 5–15)
Anion gap: 19 — ABNORMAL HIGH (ref 5–15)
BUN: 64 mg/dL — ABNORMAL HIGH (ref 6–20)
BUN: 65 mg/dL — ABNORMAL HIGH (ref 6–20)
CO2: 24 mmol/L (ref 22–32)
CO2: 23 mmol/L (ref 22–32)
Calcium: 8.9 mg/dL (ref 8.9–10.3)
Calcium: 8.8 mg/dL — ABNORMAL LOW (ref 8.9–10.3)
Chloride: 88 mmol/L — ABNORMAL LOW (ref 98–111)
Chloride: 88 mmol/L — ABNORMAL LOW (ref 98–111)
Creatinine, Ser: 9.69 mg/dL — ABNORMAL HIGH (ref 0.44–1.00)
Creatinine, Ser: 9.97 mg/dL — ABNORMAL HIGH (ref 0.44–1.00)
GFR, Estimated: 5 mL/min — ABNORMAL LOW (ref >60)
GFR, Estimated: 5 mL/min — ABNORMAL LOW (ref >60)
Glucose, Bld: 169 mg/dL — ABNORMAL HIGH (ref 70–99)
Glucose, Bld: 184 mg/dL — ABNORMAL HIGH (ref 70–99)
Potassium: 6 mmol/L — ABNORMAL HIGH (ref 3.5–5.1)
Potassium: 5.9 mmol/L — ABNORMAL HIGH (ref 3.5–5.1)
Sodium: 130 mmol/L — ABNORMAL LOW (ref 135–145)
Sodium: 130 mmol/L — ABNORMAL LOW (ref 135–145)
Total Bilirubin: 0.7 mg/dL (ref 0.3–1.2)
Total Bilirubin: 0.7 mg/dL (ref 0.3–1.2)
Total Protein: 6.8 g/dL (ref 6.5–8.1)
Total Protein: 6.8 g/dL (ref 6.5–8.1)

## 2023-08-08 LAB — CBC
HCT: 28.1 % — ABNORMAL LOW (ref 36.0–46.0)
Hemoglobin: 8.5 g/dL — ABNORMAL LOW (ref 12.0–15.0)
MCH: 25.8 pg — ABNORMAL LOW (ref 26.0–34.0)
MCHC: 30.2 g/dL (ref 30.0–36.0)
MCV: 85.2 fL (ref 80.0–100.0)
Platelets: 116 10*3/uL — ABNORMAL LOW (ref 150–400)
RBC: 3.3 MIL/uL — ABNORMAL LOW (ref 3.87–5.11)
RDW: 18.6 % — ABNORMAL HIGH (ref 11.5–15.5)
WBC: 8.1 10*3/uL (ref 4.0–10.5)
nRBC: 0 % (ref 0.0–0.2)

## 2023-08-08 LAB — GLUCOSE, CAPILLARY: Glucose-Capillary: 231 mg/dL — ABNORMAL HIGH (ref 70–99)

## 2023-08-08 LAB — CBG MONITORING, ED
Glucose-Capillary: 160 mg/dL — ABNORMAL HIGH (ref 70–99)
Glucose-Capillary: 199 mg/dL — ABNORMAL HIGH (ref 70–99)
Glucose-Capillary: 211 mg/dL — ABNORMAL HIGH (ref 70–99)
Glucose-Capillary: 223 mg/dL — ABNORMAL HIGH (ref 70–99)
Glucose-Capillary: 232 mg/dL — ABNORMAL HIGH (ref 70–99)
Glucose-Capillary: 255 mg/dL — ABNORMAL HIGH (ref 70–99)

## 2023-08-08 LAB — MAGNESIUM
Magnesium: 2.1 mg/dL (ref 1.7–2.4)
Magnesium: 2.2 mg/dL (ref 1.7–2.4)

## 2023-08-08 LAB — TSH: TSH: 0.413 u[IU]/mL (ref 0.350–4.500)

## 2023-08-08 LAB — HEPATITIS B SURFACE ANTIBODY, QUANTITATIVE: Hep B S AB Quant (Post): 49.4 m[IU]/mL

## 2023-08-08 LAB — PREALBUMIN: Prealbumin: 22 mg/dL (ref 18–38)

## 2023-08-08 LAB — PHOSPHORUS: Phosphorus: 8.8 mg/dL — ABNORMAL HIGH (ref 2.5–4.6)

## 2023-08-08 MED ORDER — HEPARIN SODIUM (PORCINE) 5000 UNIT/ML IJ SOLN
5000.0000 [IU] | Freq: Three times a day (TID) | INTRAMUSCULAR | Status: DC
  Administered 2023-08-08 – 2023-08-09 (×3): 5000 [IU] via SUBCUTANEOUS
  Filled 2023-08-08 (×5): qty 1

## 2023-08-08 MED ORDER — SODIUM ZIRCONIUM CYCLOSILICATE 10 G PO PACK
10.0000 g | PACK | Freq: Once | ORAL | Status: AC
  Administered 2023-08-08: 10 g via ORAL
  Filled 2023-08-08: qty 1

## 2023-08-08 MED ORDER — AZITHROMYCIN 500 MG PO TABS
500.0000 mg | ORAL_TABLET | Freq: Every day | ORAL | Status: DC
  Administered 2023-08-08 – 2023-08-09 (×2): 500 mg via ORAL
  Filled 2023-08-08: qty 2
  Filled 2023-08-08: qty 1

## 2023-08-08 MED ORDER — HYDRALAZINE HCL 20 MG/ML IJ SOLN: 10.0000 mg | Freq: Three times a day (TID) | INTRAMUSCULAR | Status: DC | PRN

## 2023-08-08 NOTE — Progress Notes (Addendum)
Pt receives out-pt HD at [facility] on MWF. Clinic advised that pt tested positive for covid. Will assist as needed.   Olivia Canter Renal Navigator [phone]

## 2023-08-08 NOTE — ED Notes (Signed)
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 08/08/23 0920)  insulin glargine-yfgn (SEMGLEE) injection 5 Units (5 Units Subcutaneous Given 08/08/23 1107)  levothyroxine (SYNTHROID) tablet 175 mcg (175 mcg Oral Given 08/08/23 0520)  hydrALAZINE (APRESOLINE) injection 10 mg (has no administration in time range)  azithromycin (ZITHROMAX) tablet 500 mg (has no administration in time range)   heparin injection 5,000 Units (has no administration in time range)  sodium zirconium cyclosilicate (LOKELMA) packet 20 g (20 g Oral Given 08/07/23 1945)  iohexol (OMNIPAQUE) 350 MG/ML injection 75 mL (75 mLs Intravenous Contrast Given 08/07/23 1919)  sodium zirconium cyclosilicate (LOKELMA) packet 10 g (10 g Oral Given 08/08/23 0520)    Mobility walks     Focused Assessments Renal Assessment Handoff:  Hemodialysis Schedule: Hemodialysis Schedule: Tuesday/Thursday/Saturday Last Hemodialysis date and time: Saturday 08/17   Restricted appendage: left arm   R Recommendations: See Admitting Provider Note  Report given to:   Additional Notes:

## 2023-08-08 NOTE — Evaluation (Signed)
Physical Therapy Evaluation & Discharge Patient Details Name: Angela [name] MRN: [id number] DOB: [birth year] Today's Date: 08/08/2023  History of Present Illness  Pt is a 37 y.o. female admitted 08/07/23 with acute hypoxic respiratory failure secondary to COVID+ and pulmonary edema; HD session 8/18 due to illness. PMH includes ESRD (HD MWF), HTN, DM1, anemia, hemorrhagic stroke, seizures.   Clinical Impression  Patient evaluated by Physical Therapy with no further acute PT needs identified. PTA, pt independent, lives with mother, reports she doesn't get out much. Today, pt independent with mobility and self-care tasks; endorses fatigue. Educ re: activity recommendations, therex/HEP, pulmonary hygiene. All education has been completed and the patient has no further questions. Acute PT is signing off. Thank you for this referral.  BP 149/88 (105), HR 89, SpO2 98% on 6L O2 HFNC    If plan is discharge home, recommend the following: Assist for transportation   Can travel by private vehicle    Yes    Equipment Recommendations None recommended by PT  Recommendations for Other Services       Functional Status Assessment       Precautions / Restrictions Precautions Precautions: Other (comment) Precaution Comments: watch SpO2 (does not wear baseline) Restrictions Weight Bearing Restrictions: No      Mobility  Bed Mobility Overal bed mobility: Modified Independent             General bed mobility comments: ED stretcher    Transfers Overall transfer level: Independent Equipment used: None                    Ambulation/Gait Ambulation/Gait assistance: Independent   Assistive device: None Gait Pattern/deviations: Decreased stride length, Step-through pattern Gait velocity: Decreased     General Gait Details: independent ambulation in ED room, no overt instability or LOB; distance limited by lines/leads  Stairs            Wheelchair Mobility     Tilt  Bed    Modified Rankin (Stroke Patients Only)       Balance Overall balance assessment: No apparent balance deficits (not formally assessed) Sitting-balance support: No upper extremity supported, Feet unsupported Sitting balance-Leahy Scale: Good     Standing balance support: No upper extremity supported, During functional activity Standing balance-Leahy Scale: Good                               Pertinent Vitals/Pain Pain Assessment Pain Assessment: Faces Faces Pain Scale: Hurts a little bit Pain Location: generalized Pain Descriptors / Indicators: Tiring Pain Intervention(s): Monitored during session    Home Living Family/patient expects to be discharged to:: Private residence Living Arrangements: Parent Available Help at Discharge: Family;Available 24 hours/day Type of Home: House Home Access: Stairs to enter Entrance Stairs-Rails: Can reach both Entrance Stairs-Number of Steps: 10-12   Home Layout: One level Home Equipment: Cane - single point      Prior Function Prior Level of Function : Independent/Modified Independent             Mobility Comments: mod indep with intermittent use of SPC. does not work, on disability. reports fatigued after HD (MWF) so she does not get out much ADLs Comments: independent     Extremity/Trunk Assessment   Upper Extremity Assessment Upper Extremity Assessment: Overall WFL for tasks assessed    Lower Extremity Assessment Lower Extremity Assessment: Overall WFL for tasks assessed       Communication  Communication Communication: No apparent difficulties  Cognition Arousal: Alert Behavior During Therapy: WFL for tasks assessed/performed Overall Cognitive Status: Within Functional Limits for tasks assessed                                          General Comments General comments (skin integrity, edema, etc.): SpO2 99% on 8L O2 HFNC, reduced to 6L O2 HFNC. educ re: POC, activity  recommendations, pulmonary hygiene, importance of OOB mobility, mobility with HD.    Exercises Other Exercises Other Exercises: [employer] HEP handout (Access Code [email or web address]) provided - squat with chair touch, standing calf raise, supine glut bridge holds, SLR. pt reports familiar with these exercises from recent outpatient PT due to RLE injury   Assessment/Plan    PT Assessment Patient does not need any further PT services  PT Problem List         PT Treatment Interventions      PT Goals (Current goals can be found in the Care Plan section)  Acute Rehab PT Goals PT Goal Formulation: All assessment and education complete, DC therapy    Frequency       Co-evaluation               AM-PAC PT "6 Clicks" Mobility  Outcome Measure Help needed turning from your back to your side while in a flat bed without using bedrails?: None Help needed moving from lying on your back to sitting on the side of a flat bed without using bedrails?: None Help needed moving to and from a bed to a chair (including a wheelchair)?: None Help needed standing up from a chair using your arms (e.g., wheelchair or bedside chair)?: None Help needed to walk in hospital room?: None Help needed climbing 3-5 steps with a railing? : A Little 6 Click Score: 23    End of Session Equipment Utilized During Treatment: Oxygen Activity Tolerance: Patient tolerated treatment well;Patient limited by fatigue Patient left: in bed;with call bell/phone within reach (sitting EOB on ED stretcher to eat breakfast) Nurse Communication: Mobility status PT Visit Diagnosis: Other abnormalities of gait and mobility (R26.89)    Time: 9629-5284 PT Time Calculation (min) (ACUTE ONLY): 24 min   Charges:   PT Evaluation $PT Eval Low Complexity: 1 Low   PT General Charges $$ ACUTE PT VISIT: 1 Visit        Ina Homes, PT, DPT Acute Rehabilitation Services  Personal: Secure Chat Rehab Office: [phone]  Malachy Chamber 08/08/2023, 12:45 PM

## 2023-08-08 NOTE — Progress Notes (Signed)
[facility] Progress Note   Subjective:  Seen in ED bed - still has not been dialyzed. Due to COVID status, we are awaiting room assignment and then will be dialyzed in her hospital room. Still feeling cruddy, mild SOB on nasal O2.   Objective Vitals:   08/08/23 0802 08/08/23 0830 08/08/23 0900 08/08/23 0930  BP: (!) 169/84 (!) 189/87 (!) 151/77 (!) 152/74  Pulse: 86 87 87 89  Resp: 20 13 13 15   Temp:      TempSrc:      SpO2: 98% 100% 100% 100%  Weight:      Height:       Physical Exam General: Ill appearing woman, NAD. Nasal O2 in place Heart: RRR; no murmur Lungs: Lateral bibasilar crackles Abdomen: soft Extremities: trace BLE edema Dialysis Access: LUE AVF + bruit  Additional Objective Labs: Basic Metabolic Panel: Recent Labs  Lab 08/07/23 2140 08/08/23 0234 08/08/23 0352  NA 131* 130* 130*  K 5.7* 6.0* 5.9*  CL 88* 88* 88*  CO2 24 24 23   GLUCOSE 166* 169* 184*  BUN 65* 64* 65*  CREATININE 9.69* 9.69* 9.97*  CALCIUM 8.5* 8.9 8.8*  PHOS  --  8.8*  --    Liver Function Tests: Recent Labs  Lab 08/07/23 2140 08/08/23 0234 08/08/23 0352  AST 30 27 24   ALT 35 36 37  ALKPHOS 267* 282* 281*  BILITOT 1.1 0.7 0.7  PROT 6.4* 6.8 6.8  ALBUMIN 3.4* 3.6 3.6   CBC: Recent Labs  Lab 08/07/23 1649 08/07/23 2008 08/08/23 0352  WBC 5.4  --  8.1  HGB 9.3* 9.9* 8.5*  HCT 29.4* 29.0* 28.1*  MCV 85.2  --  85.2  PLT 124*  --  116*   Blood Culture    Component Value Date/Time   SDES BLOOD SITE NOT SPECIFIED 08/07/2023 2140   SDES BLOOD SITE NOT SPECIFIED 08/07/2023 2140   SPECREQUEST  08/07/2023 2140    BOTTLES DRAWN AEROBIC AND ANAEROBIC Blood Culture adequate volume   SPECREQUEST  08/07/2023 2140    BOTTLES DRAWN AEROBIC AND ANAEROBIC Blood Culture adequate volume   CULT  08/07/2023 2140    NO GROWTH < 12 HOURS Performed at [facility], [address], [place], [place] [address]    CULT  08/07/2023 2140    NO GROWTH < 12  HOURS Performed at [facility], [address], [place], [place] [address]    REPTSTATUS PENDING 08/07/2023 2140   REPTSTATUS PENDING 08/07/2023 2140    Cardiac Enzymes: Recent Labs  Lab 08/07/23 2140  CKTOTAL 120   CBG: Recent Labs  Lab 08/07/23 1950 08/07/23 2246 08/08/23 0031 08/08/23 0526 08/08/23 0801  GLUCAP 165* 133* 160* 199* 223*   Studies/Results: CT Angio Chest PE W and/or Wo Contrast  Result Date: 08/07/2023 CLINICAL DATA:  Chest pain, hypoxia. EXAM: CT ANGIOGRAPHY CHEST WITH CONTRAST TECHNIQUE: Multidetector CT imaging of the chest was performed using the standard protocol during bolus administration of intravenous contrast. Multiplanar CT image reconstructions and MIPs were obtained to evaluate the vascular anatomy. RADIATION DOSE REDUCTION: This exam was performed according to the departmental dose-optimization program which includes automated exposure control, adjustment of the mA and/or kV according to patient size and/or use of iterative reconstruction technique. CONTRAST:  75mL OMNIPAQUE IOHEXOL 350 MG/ML SOLN COMPARISON:  08/01/2022 FINDINGS: Cardiovascular: Cardiomegaly. No pericardial effusion. No filling defects in pulmonary arteries to suggest PE. Main pulmonary artery is prominent, 4.7 cm suggesting possible underlying pulmonic  stenosis. No evidence of aortic aneurysm or dissection. Mediastinum/Nodes: No suspicious adenopathy. Unremarkable esophagus and tracheobronchial tree. Lungs/Pleura: Dependent basilar subsegmental atelectasis lower lobes. Diffuse ground-glass opacities and interstitial prominence consistent with pulmonary edema. Overall findings suggest CHF. There is a small right-sided pleural effusion. No pneumothorax. Upper Abdomen: No acute abnormality. Musculoskeletal: No chest wall abnormality. No acute or significant osseous findings. Review of the MIP images confirms the above findings. IMPRESSION: 1. No PE, aneurysm or dissection. 2. Findings  consistent with CHF. 3. 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 4. Dependent bibasilar subsegmental atelectasis or consolidation. Electronically Signed   By: Layla Maw M.D.   On: 08/07/2023 20:17   DG Chest 2 View  Result Date: 08/07/2023 CLINICAL DATA:  Chest pain.  Hypoxia. EXAM: CHEST - 2 VIEW COMPARISON:  Radiograph 02/02/2023 FINDINGS: The heart is enlarged. Diffuse interstitial thickening consistent with pulmonary edema. Small to moderate bilateral pleural effusions and fluid in the fissures. No pneumothorax or focal airspace disease. No acute osseous findings. IMPRESSION: CHF.  Cardiomegaly with pulmonary edema and pleural effusions. Electronically Signed   By: Narda Rutherford M.D.   On: 08/07/2023 18:18    Medications:  sodium chloride     cefTRIAXone (ROCEPHIN)  IV Stopped (08/07/23 2135)   remdesivir 100 mg in sodium chloride 0.9 % 100 mL IVPB Stopped (08/08/23 1009)    azithromycin  500 mg Oral Daily   Chlorhexidine Gluconate Cloth  6 each Topical Q0600   escitalopram  20 mg Oral Daily   insulin aspart  0-9 Units Subcutaneous Q4H   insulin glargine-yfgn  5 Units Subcutaneous BID   levETIRAcetam  500 mg Oral BID   levothyroxine  175 mcg Oral Q0600   methylPREDNISolone (SOLU-MEDROL) injection  0.5 mg/kg Intravenous Q12H   Followed by   Melene Muller ON 08/11/2023] predniSONE  50 mg Oral Daily   metoCLOPramide  5 mg Oral BID WC   pantoprazole  40 mg Oral Daily   sodium chloride flush  3 mL Intravenous Q12H   Dialysis Orders: MWF at [facility] 3:30hr, 400/800, EDW 60.5kg, 2K/2Ca, AVF, ?no heparin - Mircera IV q 2 weeks - last 8/12 - Calcitriol 0.73mcg PO q HD  Assessment/Plan: 1. Acute Hypoxic RF: Combination of COVID + pulm edema: Follow for improvement after HD. Given IV steroids, as well as Ceftriaxone + azithro for possible COVID pneumonia. 2. ESRD: Missed yesterday's HD due to illness - for HD today, then back to usual sched  tomorrow. 3. HTN/volume: BP very high - pulm edema on imaging, 4L UFG today. 4. Hyperkalemia: Lokelma has been given twice, will further correct with HD. 5. Anemia of ESRD: Hgb low - not quite due for ESA yet. 6. Secondary hyperparathyroidism:  Ca ok, Phos high - continue home binders - need to clarify with patient. 7. Nutrition:  Adding protein supps. 8. T1DM: Insulin, per primary.  Ozzie Hoyle, PA-C 08/08/2023, 11:30 AM  [employer]

## 2023-08-08 NOTE — Progress Notes (Signed)
Bedside nurse reported that patient dialysis will be delayed 4 hours from now as there is dialysis nurse shortage.  Patient initially has been admitted earlier tonight acute hypoxic respiratory failure in the setting of missing dialysis session.  Nephrologist Dr. Signe Colt already placed order for dialysis tonight as soon as possible however it it will be probably happen around 6:30 AM.  Checking stat BMP as initial lab work showed potassium 6.6 improved to 5.7. -As dialysis will will be delayed checking stat BMP to make sure potassium level is fine.  Patient oxygen requirement also going up 5 L to 10 L currently maintaining O2 sat 99%.  Bedside nurse reported that patient is a mouth breathe that is why they have to bump up the oxygen.  Tereasa Coop, MD [facility] 08/08/2023, 2:50 AM

## 2023-08-08 NOTE — Progress Notes (Signed)
OT Cancellation Note  Patient Details Name: Angela [name] MRN: [id number] DOB: March 23, 1986   Cancelled Treatment:    Reason Eval/Treat Not Completed: OT screened, no needs identified, will sign off (Discussed with PT)  Breland Elders,HILLARY 08/08/2023, 10:26 AM Luisa Dago, OT/L   Acute OT Clinical Specialist Acute Rehabilitation Services Pager [phone] Office [phone]

## 2023-08-08 NOTE — ED Notes (Signed)
Pt was not tolerating 5l [place], switched to 10l High flow

## 2023-08-08 NOTE — Progress Notes (Addendum)
Repeat CMP showed persistently elevated potassium 6. - Nephrologist Dr. Signe Colt already ordered [facility] 10 g which is the second dose. - Patient's blood pressure continuing to trending up.  She has history of CVA.  Ordered as needed hydralazine 10 mg every 8 as needed for SBP>160 or DBP>110.   Tereasa Coop, MD [facility] 08/08/2023, 4:29 AM

## 2023-08-08 NOTE — Plan of Care (Signed)

## 2023-08-08 NOTE — Progress Notes (Signed)
PROGRESS NOTE    Angela [name]  HYQ:[id number] DOB: [birth year] DOA: 08/07/2023 PCP: Marcine Matar, MD   Brief Narrative:  Angela [name] is a 37 y.o. 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  Assessment & Plan:   Principal Problem:   Acute respiratory failure (HCC) Active Problems:   Uncontrolled type 1 diabetes mellitus with hypoglycemia, with long-term current use of insulin (HCC)   ESRD on dialysis (HCC)   ICH (intracerebral hemorrhage) (HCC)   Seizure (HCC)   Hypothyroidism   COVID-19 virus infection   Hyperkalemia  Acute hypoxic respiratory failure secondary to acute pulmonary edema/acute on chronic congestive heart failure with preserved ejection fraction in the setting of missing hemodialysis: Patient still feels shortness of breath.  She needs dialysis.  Nephrology on board.  Echo recently done in February 24 showed normal ejection fraction.  COVID-19 infection/?  Superimposed bacterial infection/pneumonia: Chest x-ray and CT chest did not show any groundglass opacities, some concern of subsegmental consolidation.  No leukocytosis or fever.  Hypoxia secondary to pulm edema.  This is incidental finding, however she has been started on remdesivir so we will complete 3 doses along with Solu-Medrol.  Procalcitonin significantly elevated which could very well be in patients with ESRD however bacterial pneumonia cannot be ruled out completely so we will continue Rocephin that has been started.  ESRD on hemodialysis: Per nephrology.  Acquired hypothyroidism: Resume Synthroid.  History of seizure: Continue home medications.  She  is on Keppra.  Uncontrolled type 1 diabetes mellitus with hyperglycemia, with long-term use of insulin: She is on 5 units of Semglee twice daily and SSI.  DVT prophylaxis: SCDs Start: 08/07/23 2056 heparin   Code Status: Full Code  Family Communication:  None present at bedside.  Plan of care discussed with patient in length and he/she verbalized understanding and agreed with it.  Status is: Inpatient Remains inpatient appropriate because: Patient symptomatic with hypoxia, needs dialysis.   Estimated body mass index is 24.03 kg/m as calculated from the following:   Height as of this encounter: 5\' 4"  (1.626 m).   Weight as of this encounter: 63.5 kg.    Nutritional Assessment: Body mass index is 24.03 kg/m.[name] Seen by dietician.  I agree with the assessment and plan as outlined below: Nutrition Status:        . Skin Assessment: I have examined the patient's skin and I agree with the wound assessment as performed by the wound care RN as outlined below:    Consultants:  Nephrology  Procedures:  None  Antimicrobials:  Anti-infectives (From admission, onward)    Start     Dose/Rate Route Frequency Ordered Stop   08/08/23 2200  azithromycin (ZITHROMAX) tablet 500 mg        500 mg Oral Daily 08/08/23 0853 08/12/23 2159   08/08/23 1000  remdesivir 100 mg in sodium chloride 0.9 % 100 mL IVPB       Placed in "Followed by" Linked Group   100 mg 200 mL/hr over 30 Minutes Intravenous Daily 08/07/23 2102 08/10/23 0959   08/07/23 2300  remdesivir 200 mg in sodium chloride 0.9% 250 mL IVPB       Placed in "Followed  by" Linked Group   200 mg 580 mL/hr over 30 Minutes Intravenous Once 08/07/23 2102 08/08/23 0006   08/07/23 2145  fluconazole (DIFLUCAN) tablet 150 mg  Status:  Discontinued        150 mg Oral Daily 08/07/23 2144 08/07/23 2213   08/07/23 2100  fluconazole (DIFLUCAN) tablet 150 mg  Status:  Discontinued        150 mg Oral Daily 08/07/23 2055 08/07/23 2141   08/07/23  2030  cefTRIAXone (ROCEPHIN) 2 g in sodium chloride 0.9 % 100 mL IVPB        2 g 200 mL/hr over 30 Minutes Intravenous Every 24 hours 08/07/23 2023 08/12/23 2029   08/07/23 2030  azithromycin (ZITHROMAX) 500 mg in sodium chloride 0.9 % 250 mL IVPB  Status:  Discontinued        500 mg 250 mL/hr over 60 Minutes Intravenous Every 24 hours 08/07/23 2023 08/08/23 0853         Subjective: Patient seen and examined.  Still complains of shortness of breath and weakness.  No other complaint.  Objective: Vitals:   08/08/23 1030 08/08/23 1100 08/08/23 1130 08/08/23 1150  BP: (!) 148/84 (!) 157/88 (!) 160/96 (!) 164/83  Pulse: 88 85 86 88  Resp: 19 10 11 13   Temp:    98.7 F (37.1 C)  TempSrc:    Oral  SpO2: (!) 88% 96% 100% 100%  Weight:      Height:       No intake or output data in the 24 hours ending 08/08/23 1401 Filed Weights   08/07/23 1636  Weight: 63.5 kg    Examination:  General exam: Appears calm and comfortable  Respiratory system: Clear to auscultation. Respiratory effort normal. Cardiovascular system: S1 & S2 heard, RRR. No JVD, murmurs, rubs, gallops or clicks. No pedal edema. Gastrointestinal system: Abdomen is nondistended, soft and nontender. No organomegaly or masses felt. Normal bowel sounds heard. Central nervous system: Alert and oriented. No focal neurological deficits. Extremities: Symmetric 5 x 5 power. Skin: No rashes, lesions or ulcers Psychiatry: Judgement and insight appear normal. Mood & affect appropriate.    Data Reviewed: I have personally reviewed following labs and imaging studies  CBC: Recent Labs  Lab 08/07/23 1649 08/07/23 2008 08/08/23 0352  WBC 5.4  --  8.1  HGB 9.3* 9.9* 8.5*  HCT 29.4* 29.0* 28.1*  MCV 85.2  --  85.2  PLT 124*  --  116*   Basic Metabolic Panel: Recent Labs  Lab 08/07/23 1649 08/07/23 2008 08/07/23 2140 08/08/23 0234 08/08/23 0352  NA 131* 127* 131* 130* 130*  K 6.0* 6.6* 5.7* 6.0* 5.9*  CL 88*  --  88*  88* 88*  CO2 26  --  24 24 23   GLUCOSE 166*  --  166* 169* 184*  BUN 59*  --  65* 64* 65*  CREATININE 9.45*  --  9.69* 9.69* 9.97*  CALCIUM 8.8*  --  8.5* 8.9 8.8*  MG  --   --   --  2.1 2.2  PHOS  --   --   --  8.8*  --    GFR: Estimated Creatinine Clearance: 6.6 mL/min (A) (by C-G formula based on SCr of 9.97 mg/dL (H)). Liver Function Tests: Recent Labs  Lab 08/07/23 2140 08/08/23 0234 08/08/23 0352  AST 30 27 24   ALT 35 36 37  ALKPHOS 267* 282* 281*  BILITOT 1.1 0.7 0.7  PROT 6.4* 6.8 6.8  ALBUMIN 3.4* 3.6 3.6  No results for input(s): "LIPASE", "AMYLASE" in the last 168 hours. No results for input(s): "AMMONIA" in the last 168 hours. Coagulation Profile: No results for input(s): "INR", "PROTIME" in the last 168 hours. Cardiac Enzymes: Recent Labs  Lab 08/07/23 2140  CKTOTAL 120   BNP (last 3 results) No results for input(s): "PROBNP" in the last 8760 hours. HbA1C: No results for input(s): "HGBA1C" in the last 72 hours. CBG: Recent Labs  Lab 08/07/23 2246 08/08/23 0031 08/08/23 0526 08/08/23 0801 08/08/23 1151  GLUCAP 133* 160* 199* 223* 255*   Lipid Profile: No results for input(s): "CHOL", "HDL", "LDLCALC", "TRIG", "CHOLHDL", "LDLDIRECT" in the last 72 hours. Thyroid Function Tests: Recent Labs    08/08/23 0352  TSH 0.413   Anemia Panel: No results for input(s): "VITAMINB12", "FOLATE", "FERRITIN", "TIBC", "IRON", "RETICCTPCT" in the last 72 hours. Sepsis Labs: Recent Labs  Lab 08/07/23 2140  PROCALCITON 3.71    Recent Results (from the past 240 hour(s))  Resp panel by RT-PCR (RSV, Flu A&B, Covid) Anterior Nasal Swab     Status: Abnormal   Collection Time: 08/07/23  5:00 PM   Specimen: Anterior Nasal Swab  Result Value Ref Range Status   SARS Coronavirus 2 by RT PCR POSITIVE (A) NEGATIVE Final   Influenza A by PCR NEGATIVE NEGATIVE Final   Influenza B by PCR NEGATIVE NEGATIVE Final    Comment: (NOTE) The Xpert Xpress SARS-CoV-2/FLU/RSV  plus assay is intended as an aid in the diagnosis of influenza from Nasopharyngeal swab specimens and should not be used as a sole basis for treatment. Nasal washings and aspirates are unacceptable for Xpert Xpress SARS-CoV-2/FLU/RSV testing.  Fact Sheet for Patients: BloggerCourse.com  Fact Sheet for Healthcare Providers: SeriousBroker.it  This test is not yet approved or cleared by the [place] FDA and has been authorized for detection and/or diagnosis of SARS-CoV-2 by FDA under an Emergency Use Authorization (EUA). This EUA will remain in effect (meaning this test can be used) for the duration of the COVID-19 declaration under Section 564(b)(1) of the Act, 21 U.S.C. section 360bbb-3(b)(1), unless the authorization is terminated or revoked.     Resp Syncytial Virus by PCR NEGATIVE NEGATIVE Final    Comment: (NOTE) Fact Sheet for Patients: BloggerCourse.com  Fact Sheet for Healthcare Providers: SeriousBroker.it  This test is not yet approved or cleared by the [place] FDA and has been authorized for detection and/or diagnosis of SARS-CoV-2 by FDA under an Emergency Use Authorization (EUA). This EUA will remain in effect (meaning this test can be used) for the duration of the COVID-19 declaration under Section 564(b)(1) of the Act, 21 U.S.C. section 360bbb-3(b)(1), unless the authorization is terminated or revoked.  Performed at [facility], [address], [place], [place] [address]   Culture, blood (routine x 2) Call MD if unable to obtain prior to antibiotics being given     Status: None (Preliminary result)   Collection Time: 08/07/23  9:40 PM   Specimen: BLOOD  Result Value Ref Range Status   Specimen Description BLOOD SITE NOT SPECIFIED  Final   Special Requests   Final    BOTTLES DRAWN AEROBIC AND ANAEROBIC Blood Culture adequate volume   Culture    Final    NO GROWTH < 12 HOURS Performed at [facility], [address] [place], [place] [address]    Report Status PENDING  Incomplete  Culture, blood (routine x 2) Call MD if unable to obtain prior to antibiotics being given  Status: None (Preliminary result)   Collection Time: 08/07/23  9:40 PM   Specimen: BLOOD  Result Value Ref Range Status   Specimen Description BLOOD SITE NOT SPECIFIED  Final   Special Requests   Final    BOTTLES DRAWN AEROBIC AND ANAEROBIC Blood Culture adequate volume   Culture   Final    NO GROWTH < 12 HOURS Performed at [facility], [address] [place], [place] [address]    Report Status PENDING  Incomplete     Radiology Studies: CT Angio Chest PE W and/or Wo Contrast  Result Date: 08/07/2023 CLINICAL DATA:  Chest pain, hypoxia. EXAM: CT ANGIOGRAPHY CHEST WITH CONTRAST TECHNIQUE: Multidetector CT imaging of the chest was performed using the standard protocol during bolus administration of intravenous contrast. Multiplanar CT image reconstructions and MIPs were obtained to evaluate the vascular anatomy. RADIATION DOSE REDUCTION: This exam was performed according to the departmental dose-optimization program which includes automated exposure control, adjustment of the mA and/or kV according to patient size and/or use of iterative reconstruction technique. CONTRAST:  75mL OMNIPAQUE IOHEXOL 350 MG/ML SOLN COMPARISON:  08/01/2022 FINDINGS: Cardiovascular: Cardiomegaly. No pericardial effusion. No filling defects in pulmonary arteries to suggest PE. 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 No evidence of aortic aneurysm or dissection. Mediastinum/Nodes: No suspicious adenopathy. Unremarkable esophagus and tracheobronchial tree. Lungs/Pleura: Dependent basilar subsegmental atelectasis lower lobes. Diffuse ground-glass opacities and interstitial prominence consistent with pulmonary edema. Overall  findings suggest CHF. There is a small right-sided pleural effusion. No pneumothorax. Upper Abdomen: No acute abnormality. Musculoskeletal: No chest wall abnormality. No acute or significant osseous findings. Review of the MIP images confirms the above findings. IMPRESSION: 1. No PE, aneurysm or dissection. 2. Findings consistent with CHF. 3. 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 4. Dependent bibasilar subsegmental atelectasis or consolidation. Electronically Signed   By: Layla Maw M.D.   On: 08/07/2023 20:17   DG Chest 2 View  Result Date: 08/07/2023 CLINICAL DATA:  Chest pain.  Hypoxia. EXAM: CHEST - 2 VIEW COMPARISON:  Radiograph 02/02/2023 FINDINGS: The heart is enlarged. Diffuse interstitial thickening consistent with pulmonary edema. Small to moderate bilateral pleural effusions and fluid in the fissures. No pneumothorax or focal airspace disease. No acute osseous findings. IMPRESSION: CHF.  Cardiomegaly with pulmonary edema and pleural effusions. Electronically Signed   By: Narda Rutherford M.D.   On: 08/07/2023 18:18    Scheduled Meds:  azithromycin  500 mg Oral Daily   Chlorhexidine Gluconate Cloth  6 each Topical Q0600   escitalopram  20 mg Oral Daily   insulin aspart  0-9 Units Subcutaneous Q4H   insulin glargine-yfgn  5 Units Subcutaneous BID   levETIRAcetam  500 mg Oral BID   levothyroxine  175 mcg Oral Q0600   methylPREDNISolone (SOLU-MEDROL) injection  0.5 mg/kg Intravenous Q12H   Followed by   Melene Muller ON 08/11/2023] predniSONE  50 mg Oral Daily   metoCLOPramide  5 mg Oral BID WC   pantoprazole  40 mg Oral Daily   sodium chloride flush  3 mL Intravenous Q12H   Continuous Infusions:  sodium chloride     cefTRIAXone (ROCEPHIN)  IV Stopped (08/07/23 2135)   remdesivir 100 mg in sodium chloride 0.9 % 100 mL IVPB Stopped (08/08/23 1009)     LOS: 1 day   Hughie Closs, MD [facility]  08/08/2023, 2:01 PM   *Please note  that this is a verbal dictation therefore any  spelling or grammatical errors are due to the "Dragon Medical One" system interpretation.  Please page via Amion and do not message via secure chat for urgent patient care matters. Secure chat can be used for non urgent patient care matters.  How to contact the [facility] Attending or Consulting provider 7A - 7P or covering provider during after hours 7P -7A, for this patient?  Check the care team in [facility] and look for a) attending/consulting TRH provider listed and b) the [facility] team listed. Page or secure chat 7A-7P. Log into [email or web address] and use [place] universal password to access. If you do not have the password, please contact the hospital operator. Locate the [facility] provider you are looking for under [facility] and page to a number that you can be directly reached. If you still have difficulty reaching the provider, please page the [facility] (Director on Call) for the Hospitalists listed on amion for assistance.

## 2023-08-09 ENCOUNTER — Other Ambulatory Visit: Payer: Self-pay | Admitting: Internal Medicine

## 2023-08-09 DIAGNOSIS — F331 Major depressive disorder, recurrent, moderate: Secondary | ICD-10-CM

## 2023-08-09 DIAGNOSIS — J9601 Acute respiratory failure with hypoxia: Secondary | ICD-10-CM | POA: Diagnosis not present

## 2023-08-09 LAB — COMPREHENSIVE METABOLIC PANEL
ALT: 28 U/L (ref 0–44)
AST: 16 U/L (ref 15–41)
Albumin: 3.3 g/dL — ABNORMAL LOW (ref 3.5–5.0)
Alkaline Phosphatase: 291 U/L — ABNORMAL HIGH (ref 38–126)
Anion gap: 18 — ABNORMAL HIGH (ref 5–15)
BUN: 42 mg/dL — ABNORMAL HIGH (ref 6–20)
CO2: 25 mmol/L (ref 22–32)
Calcium: 9.2 mg/dL (ref 8.9–10.3)
Chloride: 90 mmol/L — ABNORMAL LOW (ref 98–111)
Creatinine, Ser: 6.47 mg/dL — ABNORMAL HIGH (ref 0.44–1.00)
GFR, Estimated: 8 mL/min — ABNORMAL LOW (ref >60)
Glucose, Bld: 182 mg/dL — ABNORMAL HIGH (ref 70–99)
Potassium: 3.9 mmol/L (ref 3.5–5.1)
Sodium: 133 mmol/L — ABNORMAL LOW (ref 135–145)
Total Bilirubin: 0.9 mg/dL (ref 0.3–1.2)
Total Protein: 6.6 g/dL (ref 6.5–8.1)

## 2023-08-09 LAB — CBC
HCT: 31.1 % — ABNORMAL LOW (ref 36.0–46.0)
Hemoglobin: 9.7 g/dL — ABNORMAL LOW (ref 12.0–15.0)
MCH: 26.4 pg (ref 26.0–34.0)
MCHC: 31.2 g/dL (ref 30.0–36.0)
MCV: 84.7 fL (ref 80.0–100.0)
Platelets: 131 10*3/uL — ABNORMAL LOW (ref 150–400)
RBC: 3.67 MIL/uL — ABNORMAL LOW (ref 3.87–5.11)
RDW: 18 % — ABNORMAL HIGH (ref 11.5–15.5)
WBC: 6.4 10*3/uL (ref 4.0–10.5)
nRBC: 0 % (ref 0.0–0.2)

## 2023-08-09 LAB — GLUCOSE, CAPILLARY
Glucose-Capillary: 179 mg/dL — ABNORMAL HIGH (ref 70–99)
Glucose-Capillary: 137 mg/dL — ABNORMAL HIGH (ref 70–99)
Glucose-Capillary: 178 mg/dL — ABNORMAL HIGH (ref 70–99)
Glucose-Capillary: 185 mg/dL — ABNORMAL HIGH (ref 70–99)
Glucose-Capillary: 195 mg/dL — ABNORMAL HIGH (ref 70–99)
Glucose-Capillary: 306 mg/dL — ABNORMAL HIGH (ref 70–99)

## 2023-08-09 LAB — MAGNESIUM: Magnesium: 1.8 mg/dL (ref 1.7–2.4)

## 2023-08-09 MED ORDER — ALBUTEROL SULFATE HFA 108 (90 BASE) MCG/ACT IN AERS: 2.0000 | INHALATION_SPRAY | Freq: Four times a day (QID) | RESPIRATORY_TRACT | Status: DC | PRN

## 2023-08-09 MED ORDER — ALBUTEROL SULFATE (2.5 MG/3ML) 0.083% IN NEBU: 2.5000 mg | INHALATION_SOLUTION | Freq: Four times a day (QID) | RESPIRATORY_TRACT | Status: DC | PRN

## 2023-08-09 MED ORDER — BUSPIRONE HCL 10 MG PO TABS
15.0000 mg | ORAL_TABLET | Freq: Every day | ORAL | Status: DC
  Administered 2023-08-09: 15 mg via ORAL
  Filled 2023-08-09: qty 2

## 2023-08-09 MED ORDER — ESCITALOPRAM OXALATE 10 MG PO TABS: 20.0000 mg | ORAL_TABLET | Freq: Every day | ORAL | Status: DC

## 2023-08-09 MED ORDER — HYDRALAZINE HCL 50 MG PO TABS
100.0000 mg | ORAL_TABLET | Freq: Three times a day (TID) | ORAL | Status: DC
  Administered 2023-08-09 – 2023-08-10 (×4): 100 mg via ORAL
  Filled 2023-08-09 (×2): qty 2
  Filled 2023-08-09: qty 1
  Filled 2023-08-09 (×2): qty 2

## 2023-08-09 MED ORDER — GABAPENTIN 100 MG PO CAPS
100.0000 mg | ORAL_CAPSULE | Freq: Three times a day (TID) | ORAL | Status: DC
  Administered 2023-08-09 – 2023-08-10 (×4): 100 mg via ORAL
  Filled 2023-08-09 (×4): qty 1

## 2023-08-09 MED ORDER — CARVEDILOL 6.25 MG PO TABS
6.2500 mg | ORAL_TABLET | Freq: Two times a day (BID) | ORAL | Status: DC
  Administered 2023-08-09 – 2023-08-10 (×2): 6.25 mg via ORAL
  Filled 2023-08-09 (×2): qty 1

## 2023-08-09 MED ORDER — FLUCONAZOLE 150 MG PO TABS
150.0000 mg | ORAL_TABLET | Freq: Every day | ORAL | Status: DC
  Administered 2023-08-09 – 2023-08-10 (×2): 150 mg via ORAL
  Filled 2023-08-09 (×2): qty 1

## 2023-08-09 MED ORDER — ENSURE ENLIVE PO LIQD
237.0000 mL | Freq: Two times a day (BID) | ORAL | Status: DC
  Administered 2023-08-09 – 2023-08-10 (×2): 237 mL via ORAL

## 2023-08-09 MED ORDER — INSULIN ASPART 100 UNIT/ML IJ SOLN
2.0000 [IU] | Freq: Three times a day (TID) | INTRAMUSCULAR | Status: DC
  Administered 2023-08-09 – 2023-08-10 (×2): 2 [IU] via SUBCUTANEOUS

## 2023-08-09 MED ORDER — AMLODIPINE BESYLATE 10 MG PO TABS
10.0000 mg | ORAL_TABLET | Freq: Every day | ORAL | Status: DC
  Administered 2023-08-09: 10 mg via ORAL
  Filled 2023-08-09: qty 1

## 2023-08-09 MED ORDER — RENA-VITE PO TABS
1.0000 | ORAL_TABLET | Freq: Every day | ORAL | Status: DC
  Administered 2023-08-09: 1 via ORAL
  Filled 2023-08-09: qty 1

## 2023-08-09 MED ORDER — DILTIAZEM HCL 60 MG PO TABS
60.0000 mg | ORAL_TABLET | Freq: Four times a day (QID) | ORAL | Status: DC
  Administered 2023-08-09 – 2023-08-10 (×4): 60 mg via ORAL
  Filled 2023-08-09 (×4): qty 1

## 2023-08-09 NOTE — Progress Notes (Signed)
Received patient at bedside.  Alert and oriented.  Informed consent signed and in chart.   TX duration: 4 hours  Patient tolerated well.  Patient remains in bed no c/o voiced.  Alert, without acute distress.  Hand-off given to patient's nurse.   Access used: LUE AVF Access issues: None  Total UF removed: 4000 mL Medication(s) given: None Post HD VS: see Data insert Post HD weight: Unable to obtain    08/09/23 0600  Vitals  Temp 97.8 F (36.6 C)  Temp Source Oral  BP (!) 171/104  MAP (mmHg) 125  BP Location Right Arm  BP Method Automatic  Patient Position (if appropriate) Lying  Pulse Rate 85  Pulse Rate Source Monitor  ECG Heart Rate 89  Resp 16  Oxygen Therapy  SpO2 90 %  O2 Device Nasal Cannula  O2 Flow Rate (L/min) 4 L/min  Patient Activity (if Appropriate) In bed  Pulse Oximetry Type Continuous  During Treatment Monitoring  Blood Flow Rate (mL/min) 0 mL/min  Arterial Pressure (mmHg) 39.39 mmHg  Venous Pressure (mmHg) -499.98 mmHg  TMP (mmHg) 22.63 mmHg  Ultrafiltration Rate (mL/min) 1497 mL/min  Dialysate Flow Rate (mL/min) 299 ml/min  Dialysate Potassium Concentration 2  Dialysate Calcium Concentration 2.5  Duration of HD Treatment -hour(s) 1.25 hour(s)  Cumulative Fluid Removed (mL) per Treatment  1500.34  Post Treatment  Dialyzer Clearance Lightly streaked  Hemodialysis Intake (mL) 0 mL  Liters Processed 30  Fluid Removed (mL) 1500 mL  Tolerated HD Treatment Yes  Note  Patient Observations Treatment complete, patient tolerated well, no c/o voiced, no acute distress. Patient A&O x 4, stable at this treatment discharge.  Fistula / Graft Left Upper arm  No placement date or time found.   Placed prior to admission: Yes  Orientation: Left  Access Location: Upper arm  Site Condition No complications  Fistula / Graft Assessment Present;Thrill;Bruit  Status Deaccessed (Gauze dressing applied to each (2) decannulation site and secured with paper tape;  hemostasis achieved.)  Drainage Description None      [facility]

## 2023-08-09 NOTE — TOC CM/SW Note (Signed)
Left VM with patient requesting return call.

## 2023-08-09 NOTE — Progress Notes (Addendum)
Initial Nutrition Assessment  DOCUMENTATION CODES:   Non-severe (moderate) malnutrition in context of chronic illness  INTERVENTION:  Will liberalize diet to carbohydrate modified to remove restrictions and encourage oral intake. Continue current fluid restriction of 1.2 L as ordered.  Provide Ensure Enlive po BID with meals, each supplement provides 350 kcal and 20 grams of protein.   Provide Rena-vite po at bedtime.   Recommend obtaining measured weight.  NUTRITION DIAGNOSIS:   Moderate Malnutrition related to chronic illness (ESRD on HD) as evidenced by mild fat depletion, mild muscle depletion, moderate muscle depletion.  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  Met with pt at bedside. She reports her appetite is good and unchanged from baseline. She reports she typically eats 2-3 meals per day. She reports dialysis is in the afternoon between lunch and dinner, so does not change her eating schedule. She reports for breakfast she has eggs, pancakes, and occasionally bacon. For lunch she has cereal. For dinner she has a meat with sides. Denies food allergies or intolerances. Denies nausea, emesis, or abdominal pain. Also denies any difficulty with chewing/swallowing. No meal documentation available at this time. Pt reported she was eating well at  meals, but meal tray at bedside was untouched at time of RD assessment. Patient would benefit from diet liberalization to remove restrictions and encourage oral intake. Pt amenable to drinking oral nutrition supplements to help meet calorie/protein needs. She reports she tolerates Ensure supplements best.  Patient denies unintentional weight loss but is unable to report UBW/dry weight at time of RD assessment. Per review of Nephrology note dry weight is 60.5 kg. Per chart weights have fluctuated between 59-69 kg. Suspect related to fluid status. Current weight may have been pulled forward from weight on 8/13. Recommend obtaining measuring weight.  Medications reviewed and include: azithromycin, Diflucan, gabapentin, Novolog 0-9 units Q4hrs, Novolog 2 units TID with meals, Semglee 5 units BID, Keppra, levothyroxine, Solu-Medrol, Reglan 5 mg BID, pantoprazole, ceftriaxone, remdesivir  Labs reviewed: Sodium 133 L, Potassium WNL, BUN 42, Creatinine 6.47  UOP: none documented since admission Pt reports she still makes some urine, but is unsure of amount  I/O: -3505.5 mL since admission  NUTRITION - FOCUSED PHYSICAL EXAM:  Flowsheet Row Most Recent Value  Orbital Region Mild depletion  Upper Arm Region Moderate depletion  Thoracic and Lumbar Region No depletion  Buccal Region Mild depletion  Temple Region Mild depletion  Clavicle Bone Region Moderate depletion  Clavicle and Acromion Bone Region Moderate depletion  Scapular Bone Region Moderate depletion  Dorsal Hand Mild depletion  Patellar Region Severe depletion  Anterior Thigh Region Moderate depletion  Posterior Calf Region Severe depletion  Edema (RD Assessment) None  Hair Reviewed  Eyes Reviewed  Mouth Reviewed  Skin Reviewed  Nails Reviewed       Diet Order:   Diet Order             Diet renal/carb  modified with fluid restriction Diet-HS Snack? Nothing; Fluid restriction: 1200 mL Fluid; Room service appropriate? Yes; Fluid  consistency: Thin  Diet effective now                  EDUCATION NEEDS:   No education needs have been identified at this time  Skin:  Skin Assessment: Reviewed RN Assessment  Last BM:  08/07/23 per chart  Height:   Ht Readings from Last 1 Encounters:  08/07/23 5\' 4"  (1.626 m)   Weight:   Wt Readings from Last 1 Encounters:  08/07/23 63.5 kg   Ideal Body Weight:  54.5 kg  BMI:  Body mass index is 24.03 kg/m.  Estimated Nutritional Needs:   Kcal:  1800-2000  Protein:  90-100 grams  Fluid:  UOP + 1 L  Abagale Boulos Tollie Eth, MS, RD, LDN, CNSC Pager number available on Amion

## 2023-08-09 NOTE — Progress Notes (Signed)
   08/09/23 0130  Vitals  BP (!) 172/101  MAP (mmHg) 122  Pulse Rate 84  ECG Heart Rate 85  Resp 13  Oxygen Therapy  SpO2 96 %  During Treatment Monitoring  Blood Flow Rate (mL/min) 400 mL/min  Arterial Pressure (mmHg) -223.44 mmHg  Venous Pressure (mmHg) 45.47 mmHg  TMP (mmHg) -155.34 mmHg  Ultrafiltration Rate (mL/min) 1561 mL/min  Dialysate Flow Rate (mL/min) 300 ml/min  Dialysate Potassium Concentration 2  Dialysate Calcium Concentration 2.5  Duration of HD Treatment -hour(s) 1.03 hour(s)  Cumulative Fluid Removed (mL) per Treatment  538.37  HD Safety Checks Performed Yes  Intra-Hemodialysis Comments Tolerated well (Treatment paused)  Post Treatment  Dialyzer Clearance Lightly streaked  Hemodialysis Intake (mL) 0 mL  Liters Processed 24.6  Fluid Removed (mL) 1300 mL  Tolerated HD Treatment No (Comment)  Note  Patient Observations Elevated TMP with alarms, new system setup required. Treatment paused for new setup.  Fistula / Graft Left Upper arm  No placement date or time found.   Placed prior to admission: Yes  Orientation: Left  Access Location: Upper arm  Site Condition No complications  Status Accessed;Flushed;Patent

## 2023-08-09 NOTE — Progress Notes (Signed)
Navigator informed by HD clinic that clinic staff contacted pt's mother to advise her that pt will need to enter HD clinic from side door at d/c due to covid diagnosis. Will assist as needed.   Olivia Canter Renal Navigator [phone]

## 2023-08-09 NOTE — Plan of Care (Signed)
  Problem: Coping: Goal: Ability to adjust to condition or change in health will improve Outcome: Progressing   Problem: Health Behavior/Discharge Planning: Goal: Ability to identify and utilize available resources and services will improve Outcome: Progressing   Problem: Skin Integrity: Goal: Risk for impaired skin integrity will decrease Outcome: Progressing   Problem: Tissue Perfusion: Goal: Adequacy of tissue perfusion will improve Outcome: Progressing

## 2023-08-09 NOTE — Progress Notes (Signed)
PROGRESS NOTE    Angela [name]  QMV:[id number] DOB: 31-May-1986 DOA: 08/07/2023 PCP: Marcine Matar, MD   Brief Narrative:  Angela [name] is a 37 y.o. 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  Assessment & Plan:   Principal Problem:   Acute respiratory failure (HCC) Active Problems:   Uncontrolled type 1 diabetes mellitus with hypoglycemia, with long-term current use of insulin (HCC)   ESRD on dialysis (HCC)   ICH (intracerebral hemorrhage) (HCC)   Seizure (HCC)   Hypothyroidism   COVID-19 virus infection   Hyperkalemia  Acute hypoxic respiratory failure secondary to acute pulmonary edema/acute on chronic congestive heart failure with preserved ejection fraction in the setting of missing hemodialysis: she received her dialysis this morning, she feels much better now.  Cardiopulmonary dysfunction.  Discussed with  COVID-19 infection/?  Superimposed bacterial infection/pneumonia: Chest x-ray and CT chest did not show any groundglass opacities, some concern of subsegmental consolidation.  No leukocytosis or fever.  Hypoxia secondary to pulm edema.  This is incidental finding, however she has been started on remdesivir so we will complete 3 doses along with Solu-Medrol.  Procalcitonin significantly elevated which could very well be in patients with ESRD however bacterial pneumonia cannot be ruled out completely so we will continue Rocephin that has been started.  ESRD on hemodialysis: Dialyzed this morning.  Per nephrology.  Acquired hypothyroidism: Resume Synthroid.  History of seizure: Continue home medications.  She is on  Keppra.  Uncontrolled type 1 diabetes mellitus with hyperglycemia, with long-term use of insulin: She is on 5 units of Semglee twice daily and SSI.  Uncontrolled hypertension: Blood pressure significant elevated.  Will resume all antihypertensives from home.  Part of elevated blood pressure is due to being fluid overload as well.  Hopefully dialysis will help.  DVT prophylaxis: heparin injection 5,000 Units Start: 08/08/23 1515 SCDs Start: 08/07/23 2056 heparin   Code Status: Full Code  Family Communication:  None present at bedside.  Plan of care discussed with patient in length and he/she verbalized understanding and agreed with it.  Status is: Inpatient Remains inpatient appropriate because: Patient symptomatic with hypoxia, needs dialysis.   Estimated body mass index is 24.03 kg/m as calculated from the following:   Height as of this encounter: 5\' 4"  (1.626 m).   Weight as of this encounter: 63.5 kg.    Nutritional Assessment: Body mass index is 24.03 kg/m.[name] Seen by dietician.  I agree with the assessment and plan as outlined below: Nutrition Status:        . Skin Assessment: I have examined the patient's skin and I agree with the wound assessment as performed by the wound care RN as outlined below:    Consultants:  Nephrology  Procedures:  None  Antimicrobials:  Anti-infectives (From admission, onward)    Start     Dose/Rate Route Frequency Ordered Stop   08/08/23 2200  azithromycin (ZITHROMAX) tablet 500 mg        500 mg Oral Daily 08/08/23 0853 08/12/23 2159   08/08/23 1000  remdesivir 100 mg in sodium chloride 0.9 % 100 mL IVPB       Placed in "Followed by" Linked Group   100  mg 200 mL/hr over 30 Minutes Intravenous Daily 08/07/23 2102 08/09/23 0946   08/07/23 2300  remdesivir 200 mg in sodium chloride 0.9% 250 mL IVPB       Placed in "Followed by" Linked Group   200 mg 580 mL/hr over 30 Minutes Intravenous Once 08/07/23 2102 08/08/23 0006   08/07/23  2145  fluconazole (DIFLUCAN) tablet 150 mg  Status:  Discontinued        150 mg Oral Daily 08/07/23 2144 08/07/23 2213   08/07/23 2100  fluconazole (DIFLUCAN) tablet 150 mg  Status:  Discontinued        150 mg Oral Daily 08/07/23 2055 08/07/23 2141   08/07/23 2030  cefTRIAXone (ROCEPHIN) 2 g in sodium chloride 0.9 % 100 mL IVPB        2 g 200 mL/hr over 30 Minutes Intravenous Every 24 hours 08/07/23 2023 08/12/23 2029   08/07/23 2030  azithromycin (ZITHROMAX) 500 mg in sodium chloride 0.9 % 250 mL IVPB  Status:  Discontinued        500 mg 250 mL/hr over 60 Minutes Intravenous Every 24 hours 08/07/23 2023 08/08/23 0853         Subjective: Seen and examined.  She is happy that she is feeling better after she received dialysis this morning.  No other complaint.  Objective: Vitals:   08/09/23 0530 08/09/23 0545 08/09/23 0600 08/09/23 0752  BP: (!) 177/95 (!) 157/94 (!) 171/104 (!) 174/95  Pulse: 85 86 85 89  Resp: 13 16 16 11   Temp:   97.8 F (36.6 C) 98.5 F (36.9 C)  TempSrc:   Oral Oral  SpO2: 100% 100% 90% 100%  Weight:      Height:        Intake/Output Summary (Last 24 hours) at 08/09/2023 0951 Last data filed at 08/09/2023 0600 Gross per 24 hour  Intake 294.5 ml  Output 3800 ml  Net -3505.5 ml   Filed Weights   08/07/23 1636  Weight: 63.5 kg    Examination:  General exam: Appears calm and comfortable  Respiratory system: Clear to auscultation. Respiratory effort normal. Cardiovascular system: S1 & S2 heard, RRR. No JVD, murmurs, rubs, gallops or clicks. No pedal edema. Gastrointestinal system: Abdomen is nondistended, soft and nontender. No organomegaly or masses felt. Normal bowel sounds heard. Central nervous system: Alert and oriented. No focal neurological deficits. Extremities: Symmetric 5 x 5 power. Skin: No rashes, lesions or ulcers.  Psychiatry: Judgement and insight appear normal. Mood & affect appropriate.   Data Reviewed: I have personally reviewed  following labs and imaging studies  CBC: Recent Labs  Lab 08/07/23 1649 08/07/23 2008 08/08/23 0352 08/09/23 0402  WBC 5.4  --  8.1 6.4  HGB 9.3* 9.9* 8.5* 9.7*  HCT 29.4* 29.0* 28.1* 31.1*  MCV 85.2  --  85.2 84.7  PLT 124*  --  116* 131*   Basic Metabolic Panel: Recent Labs  Lab 08/07/23 1649 08/07/23 2008 08/07/23 2140 08/08/23 0234 08/08/23 0352 08/09/23 0402  NA 131* 127* 131* 130* 130* 133*  K 6.0* 6.6* 5.7* 6.0* 5.9* 3.9  CL 88*  --  88* 88* 88* 90*  CO2 26  --  24 24 23 25   GLUCOSE 166*  --  166* 169* 184* 182*  BUN 59*  --  65* 64* 65* 42*  CREATININE 9.45*  --  9.69* 9.69* 9.97* 6.47*  CALCIUM 8.8*  --  8.5* 8.9 8.8* 9.2  MG  --   --   --  2.1 2.2 1.8  PHOS  --   --   --  8.8*  --   --    GFR: Estimated Creatinine Clearance: 10.2 mL/min (A) (by C-G formula based on SCr of 6.47 mg/dL (H)). Liver Function Tests: Recent Labs  Lab 08/07/23 2140 08/08/23 0234 08/08/23 0352 08/09/23 0402  AST 30 27 24 16   ALT 35 36 37 28  ALKPHOS 267* 282* 281* 291*  BILITOT 1.1 0.7 0.7 0.9  PROT 6.4* 6.8 6.8 6.6  ALBUMIN 3.4* 3.6 3.6 3.3*   No results for input(s): "LIPASE", "AMYLASE" in the last 168 hours. No results for input(s): "AMMONIA" in the last 168 hours. Coagulation Profile: No results for input(s): "INR", "PROTIME" in the last 168 hours. Cardiac Enzymes: Recent Labs  Lab 08/07/23 2140  CKTOTAL 120   BNP (last 3 results) No results for input(s): "PROBNP" in the last 8760 hours. HbA1C: No results for input(s): "HGBA1C" in the last 72 hours. CBG: Recent Labs  Lab 08/08/23 1652 08/08/23 2048 08/08/23 2344 08/09/23 0351 08/09/23 0748  GLUCAP 211* 232* 231* 179* 137*   Lipid Profile: No results for input(s): "CHOL", "HDL", "LDLCALC", "TRIG", "CHOLHDL", "LDLDIRECT" in the last 72 hours. Thyroid Function Tests: Recent Labs    08/08/23 0352  TSH 0.413   Anemia Panel: No results for input(s): "VITAMINB12", "FOLATE", "FERRITIN", "TIBC", "IRON",  "RETICCTPCT" in the last 72 hours. Sepsis Labs: Recent Labs  Lab 08/07/23 2140  PROCALCITON 3.71    Recent Results (from the past 240 hour(s))  Resp panel by RT-PCR (RSV, Flu A&B, Covid) Anterior Nasal Swab     Status: Abnormal   Collection Time: 08/07/23  5:00 PM   Specimen: Anterior Nasal Swab  Result Value Ref Range Status   SARS Coronavirus 2 by RT PCR POSITIVE (A) NEGATIVE Final   Influenza A by PCR NEGATIVE NEGATIVE Final   Influenza B by PCR NEGATIVE NEGATIVE Final    Comment: (NOTE) The Xpert Xpress SARS-CoV-2/FLU/RSV plus assay is intended as an aid in the diagnosis of influenza from Nasopharyngeal swab specimens and should not be used as a sole basis for treatment. Nasal washings and aspirates are unacceptable for Xpert Xpress SARS-CoV-2/FLU/RSV testing.  Fact Sheet for Patients: BloggerCourse.com  Fact Sheet for Healthcare Providers: SeriousBroker.it  This test is not yet approved or cleared by the [place] FDA and has been authorized for detection and/or diagnosis of SARS-CoV-2 by FDA under an Emergency Use Authorization (EUA). This EUA will remain in effect (meaning this test can be used) for the duration of the COVID-19 declaration under Section 564(b)(1) of the Act, 21 U.S.C. section 360bbb-3(b)(1), unless the authorization is terminated or revoked.     Resp Syncytial Virus by PCR NEGATIVE NEGATIVE Final    Comment: (NOTE) Fact Sheet for Patients: BloggerCourse.com  Fact Sheet for Healthcare Providers: SeriousBroker.it  This test is not yet approved or cleared by the [place] FDA and has been authorized for detection and/or diagnosis of SARS-CoV-2 by FDA under an Emergency Use Authorization (EUA). This EUA will remain in effect (meaning this test can be used) for the duration of the COVID-19 declaration under Section 564(b)(1) of the Act, 21  U.S.C. section 360bbb-3(b)(1), unless the authorization is terminated or revoked.  Performed at [facility], [address], [place], [place] [address]   Culture, blood (routine x 2) Call MD if unable to obtain prior to antibiotics being given     Status: None (Preliminary result)   Collection Time: 08/07/23  9:40 PM   Specimen: BLOOD  Result Value Ref Range Status   Specimen Description BLOOD SITE NOT SPECIFIED  Final   Special Requests   Final    BOTTLES DRAWN AEROBIC AND ANAEROBIC Blood Culture adequate volume   Culture   Final    NO GROWTH 2 DAYS Performed at [facility], [address] [place], [place] [address]    Report Status PENDING  Incomplete  Culture, blood (routine x 2) Call MD if unable to obtain prior to antibiotics being given     Status: None (Preliminary result)   Collection Time: 08/07/23  9:40 PM   Specimen: BLOOD  Result Value Ref Range Status   Specimen Description BLOOD SITE NOT SPECIFIED  Final   Special Requests   Final    BOTTLES DRAWN AEROBIC AND ANAEROBIC Blood Culture adequate volume   Culture   Final    NO GROWTH 2 DAYS Performed at [facility], [address] [place], [place] [address]    Report Status PENDING  Incomplete     Radiology Studies: CT Angio Chest PE W and/or Wo Contrast  Result Date: 08/07/2023 CLINICAL DATA:  Chest pain, hypoxia. EXAM: CT ANGIOGRAPHY CHEST WITH CONTRAST TECHNIQUE: Multidetector CT imaging of the chest was performed using the standard protocol during bolus administration of intravenous contrast. Multiplanar CT image reconstructions and MIPs were obtained to evaluate the vascular anatomy. RADIATION DOSE REDUCTION: This exam was performed according to the departmental dose-optimization program which includes automated exposure control, adjustment of the mA and/or kV according to patient size and/or use of iterative reconstruction technique. CONTRAST:  75mL OMNIPAQUE IOHEXOL 350 MG/ML SOLN COMPARISON:   08/01/2022 FINDINGS: Cardiovascular: Cardiomegaly. No pericardial effusion. No filling defects in pulmonary arteries to suggest PE. 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 No evidence of aortic aneurysm or dissection. Mediastinum/Nodes: No suspicious adenopathy. Unremarkable esophagus and tracheobronchial tree. Lungs/Pleura: Dependent basilar subsegmental atelectasis lower lobes. Diffuse ground-glass opacities and interstitial prominence consistent with pulmonary edema. Overall findings suggest CHF. There is a small right-sided pleural effusion. No pneumothorax. Upper Abdomen: No acute abnormality. Musculoskeletal: No chest wall abnormality. No acute or significant osseous findings. Review of the MIP images confirms the above findings. IMPRESSION: 1. No PE, aneurysm or dissection. 2. Findings consistent with CHF. 3. 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 4. Dependent bibasilar subsegmental atelectasis or consolidation. Electronically Signed   By: Layla Maw M.D.   On: 08/07/2023 20:17   DG Chest 2 View  Result Date: 08/07/2023 CLINICAL DATA:  Chest pain.  Hypoxia. EXAM: CHEST - 2 VIEW COMPARISON:  Radiograph 02/02/2023 FINDINGS: The heart is enlarged. Diffuse interstitial thickening consistent with pulmonary edema. Small to moderate bilateral pleural effusions and fluid in the fissures. No pneumothorax or focal airspace disease. No acute osseous findings. IMPRESSION: CHF.  Cardiomegaly with pulmonary edema and pleural effusions. Electronically Signed   By: Narda Rutherford M.D.   On: 08/07/2023 18:18    Scheduled Meds:  azithromycin  500 mg Oral Daily   Chlorhexidine Gluconate Cloth  6 each Topical Q0600   escitalopram  20 mg Oral Daily   heparin injection (subcutaneous)  5,000 Units Subcutaneous Q8H   insulin aspart  0-9 Units Subcutaneous Q4H   insulin glargine-yfgn  5 Units Subcutaneous BID    levETIRAcetam  500 mg Oral BID   levothyroxine  175 mcg Oral Q0600   methylPREDNISolone (SOLU-MEDROL) injection  0.5 mg/kg Intravenous Q12H  Followed by   Melene Muller ON 08/11/2023] predniSONE  50 mg Oral Daily   metoCLOPramide  5 mg Oral BID WC   pantoprazole  40 mg Oral Daily   sodium chloride flush  3 mL Intravenous Q12H   Continuous Infusions:  sodium chloride     cefTRIAXone (ROCEPHIN)  IV Stopped (08/08/23 2144)     LOS: 2 days   Hughie Closs, MD [facility]  08/09/2023, 9:51 AM   *Please note that this is a verbal dictation therefore any spelling or grammatical errors are due to the "Dragon Medical One" system interpretation.  Please page via Amion and do not message via secure chat for urgent patient care matters. Secure chat can be used for non urgent patient care matters.  How to contact the [facility] Attending or Consulting provider 7A - 7P or covering provider during after hours 7P -7A, for this patient?  Check the care team in [facility] and look for a) attending/consulting [facility] provider listed and b) the [facility] team listed. Page or secure chat 7A-7P. Log into [email or web address] and use [place]'s universal password to access. If you do not have the password, please contact the hospital operator. Locate the [facility] provider you are looking for under [facility] and page to a number that you can be directly reached. If you still have difficulty reaching the provider, please page the [facility] (Director on Call) for the Hospitalists listed on amion for assistance.

## 2023-08-09 NOTE — Progress Notes (Signed)
[facility] Progress Note   Subjective:   Seen in room - tearful, still feels awful. Got HD early this AM - charting is odd, looks like circuit had to be replaced 3 times but did eventually get a full HD with 4L off.   Objective Vitals:   08/09/23 0600 08/09/23 0752 08/09/23 1100 08/09/23 1200  BP: (!) 171/104 (!) 174/95 (!) 162/90 (!) 174/99  Pulse: 85 89    Resp: 16 11 17    Temp: 97.8 F (36.6 C) 98.5 F (36.9 C) 97.8 F (36.6 C)   TempSrc: Oral Oral Oral   SpO2: 90% 100% 100%   Weight:      Height:       Physical Exam General: Ill appearing woman, NAD. Nasal O2 in place. Tearful Heart: RRR; no murmur Lungs: CTA anteriorly Abdomen: soft Extremities: trace BLE edema Dialysis Access: LUE AVF + bruit    Additional Objective Labs: Basic Metabolic Panel: Recent Labs  Lab 08/08/23 0234 08/08/23 0352 08/09/23 0402  NA 130* 130* 133*  K 6.0* 5.9* 3.9  CL 88* 88* 90*  CO2 24 23 25   GLUCOSE 169* 184* 182*  BUN 64* 65* 42*  CREATININE 9.69* 9.97* 6.47*  CALCIUM 8.9 8.8* 9.2  PHOS 8.8*  --   --    Liver Function Tests: Recent Labs  Lab 08/08/23 0234 08/08/23 0352 08/09/23 0402  AST 27 24 16   ALT 36 37 28  ALKPHOS 282* 281* 291*  BILITOT 0.7 0.7 0.9  PROT 6.8 6.8 6.6  ALBUMIN 3.6 3.6 3.3*   CBC: Recent Labs  Lab 08/07/23 1649 08/07/23 2008 08/08/23 0352 08/09/23 0402  WBC 5.4  --  8.1 6.4  HGB 9.3* 9.9* 8.5* 9.7*  HCT 29.4* 29.0* 28.1* 31.1*  MCV 85.2  --  85.2 84.7  PLT 124*  --  116* 131*   Studies/Results: CT Angio Chest PE W and/or Wo Contrast  Result Date: 08/07/2023 CLINICAL DATA:  Chest pain, hypoxia. EXAM: CT ANGIOGRAPHY CHEST WITH CONTRAST TECHNIQUE: Multidetector CT imaging of the chest was performed using the standard protocol during bolus administration of intravenous contrast. Multiplanar CT image reconstructions and MIPs were obtained to evaluate the vascular anatomy. RADIATION DOSE REDUCTION: This exam was performed  according to the departmental dose-optimization program which includes automated exposure control, adjustment of the mA and/or kV according to patient size and/or use of iterative reconstruction technique. CONTRAST:  75mL OMNIPAQUE IOHEXOL 350 MG/ML SOLN COMPARISON:  08/01/2022 FINDINGS: Cardiovascular: Cardiomegaly. No pericardial effusion. No filling defects in pulmonary arteries to suggest PE. 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 No evidence of aortic aneurysm or dissection. Mediastinum/Nodes: No suspicious adenopathy. Unremarkable esophagus and tracheobronchial tree. Lungs/Pleura: Dependent basilar subsegmental atelectasis lower lobes. Diffuse ground-glass opacities and interstitial prominence consistent with pulmonary edema. Overall findings suggest CHF. There is a small right-sided pleural effusion. No pneumothorax. Upper Abdomen: No acute abnormality. Musculoskeletal: No chest wall abnormality. No acute or significant osseous findings. Review of the MIP images confirms the above findings. IMPRESSION: 1. No PE, aneurysm or dissection. 2. Findings consistent with CHF. 3. 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 4. Dependent bibasilar subsegmental atelectasis or consolidation. Electronically Signed   By: Layla Maw M.D.   On: 08/07/2023 20:17   DG Chest 2 View  Result Date: 08/07/2023 CLINICAL DATA:  Chest pain.  Hypoxia. EXAM: CHEST - 2 VIEW COMPARISON:  Radiograph 02/02/2023 FINDINGS: The heart is enlarged. Diffuse interstitial thickening consistent  with pulmonary edema. Small to moderate bilateral pleural effusions and fluid in the fissures. No pneumothorax or focal airspace disease. No acute osseous findings. IMPRESSION: CHF.  Cardiomegaly with pulmonary edema and pleural effusions. Electronically Signed   By: Narda Rutherford M.D.   On: 08/07/2023 18:18   Medications:   sodium chloride      cefTRIAXone (ROCEPHIN)  IV Stopped (08/08/23 2144)    amLODipine  10 mg Oral QHS   azithromycin  500 mg Oral Daily   busPIRone  15 mg Oral QHS   carvedilol  6.25 mg Oral BID WC   Chlorhexidine Gluconate Cloth  6 each Topical Q0600   diltiazem  60 mg Oral Q6H   escitalopram  20 mg Oral Daily   fluconazole  150 mg Oral Daily   gabapentin  100 mg Oral TID   heparin injection (subcutaneous)  5,000 Units Subcutaneous Q8H   hydrALAZINE  100 mg Oral TID   insulin aspart  0-9 Units Subcutaneous Q4H   insulin aspart  2 Units Subcutaneous TID WC   insulin glargine-yfgn  5 Units Subcutaneous BID   levETIRAcetam  500 mg Oral BID   levothyroxine  175 mcg Oral Q0600   methylPREDNISolone (SOLU-MEDROL) injection  0.5 mg/kg Intravenous Q12H   Followed by   Melene Muller ON 08/11/2023] predniSONE  50 mg Oral Daily   metoCLOPramide  5 mg Oral BID WC   pantoprazole  40 mg Oral Daily   sodium chloride flush  3 mL Intravenous Q12H    Dialysis Orders: MWF at [facility] 3:30hr, 400/800, EDW 60.5kg, 2K/2Ca, AVF, ?no heparin - Mircera IV q 2 weeks - last 8/12 - Calcitriol 0.36mcg PO q HD   Assessment/Plan: 1. Acute Hypoxic RF: Combination of COVID + pulm edema: Somewhat improved after HD. Given IV steroids, as well as Ceftriaxone + azithro for possible COVID pneumonia. 2. ESRD: Missed HD Monday - ended up not being able to be dialyzed here until early this AM - next HD on usual schedule for Friday. 3. HTN/volume: BP slighlty better - continue home meds. Remains above EDW - maximize UF with next dialysis treatment.  4. Hyperkalemia: Lokelma has been given twice, resolved with HD. 5. Anemia of ESRD: Hgb 9.7 - not due for ESA yet 6. Secondary hyperparathyroidism:  Ca ok, Phos high - continue home binders - need to clarify with patient. 7. Nutrition: Continue protein supps. 8. T1DM: Insulin, per primary.  Ozzie Hoyle, PA-C 08/09/2023, 1:06 PM  [employer]

## 2023-08-09 NOTE — Progress Notes (Signed)
   08/09/23 0400  Vitals  BP (!) 170/94  MAP (mmHg) 117  Pulse Rate 87  ECG Heart Rate 87  Resp 11  Oxygen Therapy  SpO2 100 %  During Treatment Monitoring  Blood Flow Rate (mL/min) 399 mL/min  Arterial Pressure (mmHg) -163.43 mmHg  Venous Pressure (mmHg) 232.51 mmHg  TMP (mmHg) 10.1 mmHg  Ultrafiltration Rate (mL/min) 1280 mL/min  Dialysate Flow Rate (mL/min) 299 ml/min  Duration of HD Treatment -hour(s) 1.23 hour(s)  Cumulative Fluid Removed (mL) per Treatment  1074.45  HD Safety Checks Performed Yes  Intra-Hemodialysis Comments Tolerated well (Treatment paused for new system setup. System air locked.)  Post Treatment  Dialyzer Clearance Clotted  Hemodialysis Intake (mL) 0 mL  Liters Processed 30.6  Fluid Removed (mL) 1000 mL  Tolerated HD Treatment Yes  Note  Patient Observations Air lock in venous chamber; new system setup required. Treatment paused for new setup. Patient alert, no c/o voiced, no acute distress noted. Unable to return blood.  Fistula / Graft Left Upper arm  No placement date or time found.   Placed prior to admission: Yes  Orientation: Left  Access Location: Upper arm  Site Condition No complications  Status Accessed;Flushed;Patent  Drainage Description None

## 2023-08-10 DIAGNOSIS — E440 Moderate protein-calorie malnutrition: Secondary | ICD-10-CM | POA: Insufficient documentation

## 2023-08-10 DIAGNOSIS — J9601 Acute respiratory failure with hypoxia: Secondary | ICD-10-CM | POA: Diagnosis not present

## 2023-08-10 LAB — GLUCOSE, CAPILLARY
Glucose-Capillary: 248 mg/dL — ABNORMAL HIGH (ref 70–99)
Glucose-Capillary: 260 mg/dL — ABNORMAL HIGH (ref 70–99)
Glucose-Capillary: 320 mg/dL — ABNORMAL HIGH (ref 70–99)

## 2023-08-10 LAB — COMPREHENSIVE METABOLIC PANEL
ALT: 20 U/L (ref 0–44)
AST: 14 U/L — ABNORMAL LOW (ref 15–41)
Albumin: 3.2 g/dL — ABNORMAL LOW (ref 3.5–5.0)
Alkaline Phosphatase: 236 U/L — ABNORMAL HIGH (ref 38–126)
Anion gap: 19 — ABNORMAL HIGH (ref 5–15)
BUN: 63 mg/dL — ABNORMAL HIGH (ref 6–20)
CO2: 22 mmol/L (ref 22–32)
Calcium: 9.3 mg/dL (ref 8.9–10.3)
Chloride: 87 mmol/L — ABNORMAL LOW (ref 98–111)
Creatinine, Ser: 7.57 mg/dL — ABNORMAL HIGH (ref 0.44–1.00)
GFR, Estimated: 7 mL/min — ABNORMAL LOW (ref >60)
Glucose, Bld: 342 mg/dL — ABNORMAL HIGH (ref 70–99)
Potassium: 5.1 mmol/L (ref 3.5–5.1)
Sodium: 128 mmol/L — ABNORMAL LOW (ref 135–145)
Total Bilirubin: 0.8 mg/dL (ref 0.3–1.2)
Total Protein: 6.2 g/dL — ABNORMAL LOW (ref 6.5–8.1)

## 2023-08-10 LAB — CBC
HCT: 31.6 % — ABNORMAL LOW (ref 36.0–46.0)
Hemoglobin: 10 g/dL — ABNORMAL LOW (ref 12.0–15.0)
MCH: 26 pg (ref 26.0–34.0)
MCHC: 31.6 g/dL (ref 30.0–36.0)
MCV: 82.3 fL (ref 80.0–100.0)
Platelets: 147 10*3/uL — ABNORMAL LOW (ref 150–400)
RBC: 3.84 MIL/uL — ABNORMAL LOW (ref 3.87–5.11)
RDW: 18.1 % — ABNORMAL HIGH (ref 11.5–15.5)
WBC: 5.7 10*3/uL (ref 4.0–10.5)
nRBC: 0 % (ref 0.0–0.2)

## 2023-08-10 LAB — MAGNESIUM: Magnesium: 1.9 mg/dL (ref 1.7–2.4)

## 2023-08-10 MED ORDER — AMOXICILLIN-POT CLAVULANATE 500-125 MG PO TABS: 1.0000 | ORAL_TABLET | Freq: Three times a day (TID) | ORAL | 0 refills | Status: AC

## 2023-08-10 NOTE — Plan of Care (Signed)
Problem: Education: Goal: Ability to describe self-care measures that may prevent or decrease complications (Diabetes Survival Skills Education) will improve Outcome: Progressing Goal: Individualized Educational Video(s) Outcome: Progressing   Problem: Coping: Goal: Ability to adjust to condition or change in health will improve Outcome: Progressing   Problem: Fluid Volume: Goal: Ability to maintain a balanced intake and output will improve Outcome: Progressing   Problem: Health Behavior/Discharge Planning: Goal: Ability to identify and utilize available resources and services will improve Outcome: Progressing Goal: Ability to manage health-related needs will improve Outcome: Progressing   Problem: Metabolic: Goal: Ability to maintain appropriate glucose levels will improve Outcome: Progressing   Problem: Nutritional: Goal: Maintenance of adequate nutrition will improve Outcome: Progressing Goal: Progress toward achieving an optimal weight will improve Outcome: Progressing   Problem: Skin Integrity: Goal: Risk for impaired skin integrity will decrease Outcome: Progressing   Problem: Tissue Perfusion: Goal: Adequacy of tissue perfusion will improve Outcome: Progressing   Problem: Education: Goal: Knowledge of risk factors and measures for prevention of condition will improve Outcome: Progressing   Problem: Coping: Goal: Psychosocial and spiritual needs will be supported Outcome: Progressing   Problem: Respiratory: Goal: Will maintain a patent airway Outcome: Progressing Goal: Complications related to the disease process, condition or treatment will be avoided or minimized Outcome: Progressing   Problem: Education: Goal: Knowledge of General Education information will improve Description: Including pain rating scale, medication(s)/side effects and non-pharmacologic comfort measures Outcome: Progressing   Problem: Health Behavior/Discharge Planning: Goal:  Ability to manage health-related needs will improve Outcome: Progressing   Problem: Clinical Measurements: Goal: Ability to maintain clinical measurements within normal limits will improve Outcome: Progressing Goal: Will remain free from infection Outcome: Progressing Goal: Diagnostic test results will improve Outcome: Progressing Goal: Respiratory complications will improve Outcome: Progressing Goal: Cardiovascular complication will be avoided Outcome: Progressing   Problem: Activity: Goal: Risk for activity intolerance will decrease Outcome: Progressing   Problem: Nutrition: Goal: Adequate nutrition will be maintained Outcome: Progressing   Problem: Coping: Goal: Level of anxiety will decrease Outcome: Progressing   Problem: Elimination: Goal: Will not experience complications related to bowel motility Outcome: Progressing Goal: Will not experience complications related to urinary retention Outcome: Progressing   Problem: Pain Managment: Goal: General experience of comfort will improve Outcome: Progressing   Problem: Safety: Goal: Ability to remain free from injury will improve Outcome: Progressing   Problem: Skin Integrity: Goal: Risk for impaired skin integrity will decrease Outcome: Progressing   Problem: Education: Goal: Ability to describe self-care measures that may prevent or decrease complications (Diabetes Survival Skills Education) will improve Outcome: Progressing Goal: Individualized Educational Video(s) Outcome: Progressing   Problem: Coping: Goal: Ability to adjust to condition or change in health will improve Outcome: Progressing   Problem: Fluid Volume: Goal: Ability to maintain a balanced intake and output will improve Outcome: Progressing   Problem: Health Behavior/Discharge Planning: Goal: Ability to identify and utilize available resources and services will improve Outcome: Progressing Goal: Ability to manage health-related needs  will improve Outcome: Progressing   Problem: Metabolic: Goal: Ability to maintain appropriate glucose levels will improve Outcome: Progressing   Problem: Nutritional: Goal: Maintenance of adequate nutrition will improve Outcome: Progressing Goal: Progress toward achieving an optimal weight will improve Outcome: Progressing   Problem: Skin Integrity: Goal: Risk for impaired skin integrity will decrease Outcome: Progressing   Problem: Tissue Perfusion: Goal: Adequacy of tissue perfusion will improve Outcome: Progressing   Problem: Education: Goal: Knowledge of risk factors and measures for prevention  of condition will improve Outcome: Progressing   Problem: Coping: Goal: Psychosocial and spiritual needs will be supported Outcome: Progressing   Problem: Respiratory: Goal: Will maintain a patent airway Outcome: Progressing Goal: Complications related to the disease process, condition or treatment will be avoided or minimized Outcome: Progressing   Problem: Education: Goal: Knowledge of General Education information will improve Description: Including pain rating scale, medication(s)/side effects and non-pharmacologic comfort measures Outcome: Progressing   Problem: Health Behavior/Discharge Planning: Goal: Ability to manage health-related needs will improve Outcome: Progressing   Problem: Clinical Measurements: Goal: Ability to maintain clinical measurements within normal limits will improve Outcome: Progressing Goal: Will remain free from infection Outcome: Progressing Goal: Diagnostic test results will improve Outcome: Progressing Goal: Respiratory complications will improve Outcome: Progressing Goal: Cardiovascular complication will be avoided Outcome: Progressing   Problem: Activity: Goal: Risk for activity intolerance will decrease Outcome: Progressing   Problem: Nutrition: Goal: Adequate nutrition will be maintained Outcome: Progressing   Problem:  Coping: Goal: Level of anxiety will decrease Outcome: Progressing   Problem: Elimination: Goal: Will not experience complications related to bowel motility Outcome: Progressing Goal: Will not experience complications related to urinary retention Outcome: Progressing   Problem: Pain Managment: Goal: General experience of comfort will improve Outcome: Progressing   Problem: Safety: Goal: Ability to remain free from injury will improve Outcome: Progressing   Problem: Skin Integrity: Goal: Risk for impaired skin integrity will decrease Outcome: Progressing   Problem: Education: Goal: Ability to describe self-care measures that may prevent or decrease complications (Diabetes Survival Skills Education) will improve Outcome: Progressing Goal: Individualized Educational Video(s) Outcome: Progressing   Problem: Coping: Goal: Ability to adjust to condition or change in health will improve Outcome: Progressing   Problem: Fluid Volume: Goal: Ability to maintain a balanced intake and output will improve Outcome: Progressing   Problem: Health Behavior/Discharge Planning: Goal: Ability to identify and utilize available resources and services will improve Outcome: Progressing Goal: Ability to manage health-related needs will improve Outcome: Progressing   Problem: Metabolic: Goal: Ability to maintain appropriate glucose levels will improve Outcome: Progressing   Problem: Nutritional: Goal: Maintenance of adequate nutrition will improve Outcome: Progressing Goal: Progress toward achieving an optimal weight will improve Outcome: Progressing   Problem: Skin Integrity: Goal: Risk for impaired skin integrity will decrease Outcome: Progressing   Problem: Tissue Perfusion: Goal: Adequacy of tissue perfusion will improve Outcome: Progressing   Problem: Education: Goal: Knowledge of risk factors and measures for prevention of condition will improve Outcome: Progressing    Problem: Coping: Goal: Psychosocial and spiritual needs will be supported Outcome: Progressing   Problem: Respiratory: Goal: Will maintain a patent airway Outcome: Progressing Goal: Complications related to the disease process, condition or treatment will be avoided or minimized Outcome: Progressing   Problem: Education: Goal: Knowledge of General Education information will improve Description: Including pain rating scale, medication(s)/side effects and non-pharmacologic comfort measures Outcome: Progressing   Problem: Health Behavior/Discharge Planning: Goal: Ability to manage health-related needs will improve Outcome: Progressing   Problem: Clinical Measurements: Goal: Ability to maintain clinical measurements within normal limits will improve Outcome: Progressing Goal: Will remain free from infection Outcome: Progressing Goal: Diagnostic test results will improve Outcome: Progressing Goal: Respiratory complications will improve Outcome: Progressing Goal: Cardiovascular complication will be avoided Outcome: Progressing   Problem: Activity: Goal: Risk for activity intolerance will decrease Outcome: Progressing   Problem: Nutrition: Goal: Adequate nutrition will be maintained Outcome: Progressing   Problem: Coping: Goal: Level of anxiety will decrease Outcome:  Progressing   Problem: Elimination: Goal: Will not experience complications related to bowel motility Outcome: Progressing Goal: Will not experience complications related to urinary retention Outcome: Progressing   Problem: Pain Managment: Goal: General experience of comfort will improve Outcome: Progressing   Problem: Safety: Goal: Ability to remain free from injury will improve Outcome: Progressing   Problem: Skin Integrity: Goal: Risk for impaired skin integrity will decrease Outcome: Progressing   Problem: Education: Goal: Ability to describe self-care measures that may prevent or decrease  complications (Diabetes Survival Skills Education) will improve Outcome: Progressing Goal: Individualized Educational Video(s) Outcome: Progressing   Problem: Coping: Goal: Ability to adjust to condition or change in health will improve Outcome: Progressing   Problem: Fluid Volume: Goal: Ability to maintain a balanced intake and output will improve Outcome: Progressing   Problem: Health Behavior/Discharge Planning: Goal: Ability to identify and utilize available resources and services will improve Outcome: Progressing Goal: Ability to manage health-related needs will improve Outcome: Progressing   Problem: Metabolic: Goal: Ability to maintain appropriate glucose levels will improve Outcome: Progressing   Problem: Nutritional: Goal: Maintenance of adequate nutrition will improve Outcome: Progressing Goal: Progress toward achieving an optimal weight will improve Outcome: Progressing   Problem: Skin Integrity: Goal: Risk for impaired skin integrity will decrease Outcome: Progressing   Problem: Tissue Perfusion: Goal: Adequacy of tissue perfusion will improve Outcome: Progressing   Problem: Education: Goal: Knowledge of risk factors and measures for prevention of condition will improve Outcome: Progressing   Problem: Coping: Goal: Psychosocial and spiritual needs will be supported Outcome: Progressing   Problem: Respiratory: Goal: Will maintain a patent airway Outcome: Progressing Goal: Complications related to the disease process, condition or treatment will be avoided or minimized Outcome: Progressing   Problem: Education: Goal: Knowledge of General Education information will improve Description: Including pain rating scale, medication(s)/side effects and non-pharmacologic comfort measures Outcome: Progressing   Problem: Health Behavior/Discharge Planning: Goal: Ability to manage health-related needs will improve Outcome: Progressing   Problem: Clinical  Measurements: Goal: Ability to maintain clinical measurements within normal limits will improve Outcome: Progressing Goal: Will remain free from infection Outcome: Progressing Goal: Diagnostic test results will improve Outcome: Progressing Goal: Respiratory complications will improve Outcome: Progressing Goal: Cardiovascular complication will be avoided Outcome: Progressing   Problem: Activity: Goal: Risk for activity intolerance will decrease Outcome: Progressing   Problem: Nutrition: Goal: Adequate nutrition will be maintained Outcome: Progressing   Problem: Coping: Goal: Level of anxiety will decrease Outcome: Progressing   Problem: Elimination: Goal: Will not experience complications related to bowel motility Outcome: Progressing Goal: Will not experience complications related to urinary retention Outcome: Progressing   Problem: Pain Managment: Goal: General experience of comfort will improve Outcome: Progressing   Problem: Safety: Goal: Ability to remain free from injury will improve Outcome: Progressing   Problem: Skin Integrity: Goal: Risk for impaired skin integrity will decrease Outcome: Progressing   Problem: Education: Goal: Ability to describe self-care measures that may prevent or decrease complications (Diabetes Survival Skills Education) will improve Outcome: Progressing Goal: Individualized Educational Video(s) Outcome: Progressing   Problem: Coping: Goal: Ability to adjust to condition or change in health will improve Outcome: Progressing   Problem: Fluid Volume: Goal: Ability to maintain a balanced intake and output will improve Outcome: Progressing   Problem: Health Behavior/Discharge Planning: Goal: Ability to identify and utilize available resources and services will improve Outcome: Progressing Goal: Ability to manage health-related needs will improve Outcome: Progressing   Problem: Metabolic: Goal: Ability to maintain  appropriate  glucose levels will improve Outcome: Progressing   Problem: Nutritional: Goal: Maintenance of adequate nutrition will improve Outcome: Progressing Goal: Progress toward achieving an optimal weight will improve Outcome: Progressing   Problem: Skin Integrity: Goal: Risk for impaired skin integrity will decrease Outcome: Progressing   Problem: Tissue Perfusion: Goal: Adequacy of tissue perfusion will improve Outcome: Progressing   Problem: Education: Goal: Knowledge of risk factors and measures for prevention of condition will improve Outcome: Progressing   Problem: Coping: Goal: Psychosocial and spiritual needs will be supported Outcome: Progressing   Problem: Respiratory: Goal: Will maintain a patent airway Outcome: Progressing Goal: Complications related to the disease process, condition or treatment will be avoided or minimized Outcome: Progressing   Problem: Education: Goal: Knowledge of General Education information will improve Description: Including pain rating scale, medication(s)/side effects and non-pharmacologic comfort measures Outcome: Progressing   Problem: Health Behavior/Discharge Planning: Goal: Ability to manage health-related needs will improve Outcome: Progressing   Problem: Clinical Measurements: Goal: Ability to maintain clinical measurements within normal limits will improve Outcome: Progressing Goal: Will remain free from infection Outcome: Progressing Goal: Diagnostic test results will improve Outcome: Progressing Goal: Respiratory complications will improve Outcome: Progressing Goal: Cardiovascular complication will be avoided Outcome: Progressing   Problem: Activity: Goal: Risk for activity intolerance will decrease Outcome: Progressing   Problem: Nutrition: Goal: Adequate nutrition will be maintained Outcome: Progressing   Problem: Coping: Goal: Level of anxiety will decrease Outcome: Progressing   Problem: Elimination: Goal:  Will not experience complications related to bowel motility Outcome: Progressing Goal: Will not experience complications related to urinary retention Outcome: Progressing   Problem: Pain Managment: Goal: General experience of comfort will improve Outcome: Progressing   Problem: Safety: Goal: Ability to remain free from injury will improve Outcome: Progressing   Problem: Skin Integrity: Goal: Risk for impaired skin integrity will decrease Outcome: Progressing   Problem: Education: Goal: Ability to describe self-care measures that may prevent or decrease complications (Diabetes Survival Skills Education) will improve Outcome: Progressing Goal: Individualized Educational Video(s) Outcome: Progressing   Problem: Coping: Goal: Ability to adjust to condition or change in health will improve Outcome: Progressing   Problem: Fluid Volume: Goal: Ability to maintain a balanced intake and output will improve Outcome: Progressing   Problem: Health Behavior/Discharge Planning: Goal: Ability to identify and utilize available resources and services will improve Outcome: Progressing Goal: Ability to manage health-related needs will improve Outcome: Progressing   Problem: Metabolic: Goal: Ability to maintain appropriate glucose levels will improve Outcome: Progressing   Problem: Nutritional: Goal: Maintenance of adequate nutrition will improve Outcome: Progressing Goal: Progress toward achieving an optimal weight will improve Outcome: Progressing   Problem: Skin Integrity: Goal: Risk for impaired skin integrity will decrease Outcome: Progressing   Problem: Tissue Perfusion: Goal: Adequacy of tissue perfusion will improve Outcome: Progressing   Problem: Education: Goal: Knowledge of risk factors and measures for prevention of condition will improve Outcome: Progressing   Problem: Coping: Goal: Psychosocial and spiritual needs will be supported Outcome: Progressing    Problem: Respiratory: Goal: Will maintain a patent airway Outcome: Progressing Goal: Complications related to the disease process, condition or treatment will be avoided or minimized Outcome: Progressing   Problem: Education: Goal: Knowledge of General Education information will improve Description: Including pain rating scale, medication(s)/side effects and non-pharmacologic comfort measures Outcome: Progressing   Problem: Health Behavior/Discharge Planning: Goal: Ability to manage health-related needs will improve Outcome: Progressing   Problem: Clinical Measurements: Goal: Ability to maintain clinical measurements  within normal limits will improve Outcome: Progressing Goal: Will remain free from infection Outcome: Progressing Goal: Diagnostic test results will improve Outcome: Progressing Goal: Respiratory complications will improve Outcome: Progressing Goal: Cardiovascular complication will be avoided Outcome: Progressing   Problem: Activity: Goal: Risk for activity intolerance will decrease Outcome: Progressing   Problem: Nutrition: Goal: Adequate nutrition will be maintained Outcome: Progressing   Problem: Coping: Goal: Level of anxiety will decrease Outcome: Progressing   Problem: Elimination: Goal: Will not experience complications related to bowel motility Outcome: Progressing Goal: Will not experience complications related to urinary retention Outcome: Progressing   Problem: Pain Managment: Goal: General experience of comfort will improve Outcome: Progressing   Problem: Safety: Goal: Ability to remain free from injury will improve Outcome: Progressing   Problem: Skin Integrity: Goal: Risk for impaired skin integrity will decrease Outcome: Progressing   Problem: Education: Goal: Ability to describe self-care measures that may prevent or decrease complications (Diabetes Survival Skills Education) will improve Outcome: Progressing Goal:  Individualized Educational Video(s) Outcome: Progressing   Problem: Coping: Goal: Ability to adjust to condition or change in health will improve Outcome: Progressing   Problem: Fluid Volume: Goal: Ability to maintain a balanced intake and output will improve Outcome: Progressing   Problem: Health Behavior/Discharge Planning: Goal: Ability to identify and utilize available resources and services will improve Outcome: Progressing Goal: Ability to manage health-related needs will improve Outcome: Progressing   Problem: Metabolic: Goal: Ability to maintain appropriate glucose levels will improve Outcome: Progressing   Problem: Nutritional: Goal: Maintenance of adequate nutrition will improve Outcome: Progressing Goal: Progress toward achieving an optimal weight will improve Outcome: Progressing   Problem: Skin Integrity: Goal: Risk for impaired skin integrity will decrease Outcome: Progressing   Problem: Tissue Perfusion: Goal: Adequacy of tissue perfusion will improve Outcome: Progressing   Problem: Education: Goal: Knowledge of risk factors and measures for prevention of condition will improve Outcome: Progressing   Problem: Coping: Goal: Psychosocial and spiritual needs will be supported Outcome: Progressing   Problem: Respiratory: Goal: Will maintain a patent airway Outcome: Progressing Goal: Complications related to the disease process, condition or treatment will be avoided or minimized Outcome: Progressing   Problem: Education: Goal: Knowledge of General Education information will improve Description: Including pain rating scale, medication(s)/side effects and non-pharmacologic comfort measures Outcome: Progressing   Problem: Health Behavior/Discharge Planning: Goal: Ability to manage health-related needs will improve Outcome: Progressing   Problem: Clinical Measurements: Goal: Ability to maintain clinical measurements within normal limits will  improve Outcome: Progressing Goal: Will remain free from infection Outcome: Progressing Goal: Diagnostic test results will improve Outcome: Progressing Goal: Respiratory complications will improve Outcome: Progressing Goal: Cardiovascular complication will be avoided Outcome: Progressing   Problem: Activity: Goal: Risk for activity intolerance will decrease Outcome: Progressing   Problem: Nutrition: Goal: Adequate nutrition will be maintained Outcome: Progressing   Problem: Coping: Goal: Level of anxiety will decrease Outcome: Progressing   Problem: Elimination: Goal: Will not experience complications related to bowel motility Outcome: Progressing Goal: Will not experience complications related to urinary retention Outcome: Progressing   Problem: Pain Managment: Goal: General experience of comfort will improve Outcome: Progressing   Problem: Safety: Goal: Ability to remain free from injury will improve Outcome: Progressing   Problem: Skin Integrity: Goal: Risk for impaired skin integrity will decrease Outcome: Progressing   Problem: Education: Goal: Ability to describe self-care measures that may prevent or decrease complications (Diabetes Survival Skills Education) will improve Outcome: Progressing Goal: Individualized Educational Video(s) Outcome: Progressing  Problem: Coping: Goal: Ability to adjust to condition or change in health will improve Outcome: Progressing   Problem: Fluid Volume: Goal: Ability to maintain a balanced intake and output will improve Outcome: Progressing   Problem: Health Behavior/Discharge Planning: Goal: Ability to identify and utilize available resources and services will improve Outcome: Progressing Goal: Ability to manage health-related needs will improve Outcome: Progressing   Problem: Metabolic: Goal: Ability to maintain appropriate glucose levels will improve Outcome: Progressing   Problem: Nutritional: Goal:  Maintenance of adequate nutrition will improve Outcome: Progressing Goal: Progress toward achieving an optimal weight will improve Outcome: Progressing   Problem: Skin Integrity: Goal: Risk for impaired skin integrity will decrease Outcome: Progressing   Problem: Tissue Perfusion: Goal: Adequacy of tissue perfusion will improve Outcome: Progressing   Problem: Education: Goal: Knowledge of risk factors and measures for prevention of condition will improve Outcome: Progressing   Problem: Coping: Goal: Psychosocial and spiritual needs will be supported Outcome: Progressing   Problem: Respiratory: Goal: Will maintain a patent airway Outcome: Progressing Goal: Complications related to the disease process, condition or treatment will be avoided or minimized Outcome: Progressing   Problem: Education: Goal: Knowledge of General Education information will improve Description: Including pain rating scale, medication(s)/side effects and non-pharmacologic comfort measures Outcome: Progressing   Problem: Health Behavior/Discharge Planning: Goal: Ability to manage health-related needs will improve Outcome: Progressing   Problem: Clinical Measurements: Goal: Ability to maintain clinical measurements within normal limits will improve Outcome: Progressing Goal: Will remain free from infection Outcome: Progressing Goal: Diagnostic test results will improve Outcome: Progressing Goal: Respiratory complications will improve Outcome: Progressing Goal: Cardiovascular complication will be avoided Outcome: Progressing   Problem: Activity: Goal: Risk for activity intolerance will decrease Outcome: Progressing   Problem: Nutrition: Goal: Adequate nutrition will be maintained Outcome: Progressing   Problem: Coping: Goal: Level of anxiety will decrease Outcome: Progressing   Problem: Elimination: Goal: Will not experience complications related to bowel motility Outcome:  Progressing Goal: Will not experience complications related to urinary retention Outcome: Progressing   Problem: Pain Managment: Goal: General experience of comfort will improve Outcome: Progressing   Problem: Safety: Goal: Ability to remain free from injury will improve Outcome: Progressing   Problem: Skin Integrity: Goal: Risk for impaired skin integrity will decrease Outcome: Progressing

## 2023-08-10 NOTE — Discharge Planning (Signed)
[facility] Patient Discharge Orders - [facility] CLINIC: [place]  Patient's name: Oretta Laine Admit/DC Dates: 08/07/2023 - 08/10/2023  DISCHARGE DIAGNOSES: Acute Hypoxic RF  COVID, ?early HCAP - finishing course of PO Augmentin Pulm edema  HD ORDER CHANGES: Heparin change: no EDW Change: no Bath Change: no  ANEMIA MANAGEMENT: Aranesp: Given: no   ESA dose for discharge: mircera 75 mcg IV q 2 weeks, to start on 8/26 (same) IV Iron dose at discharge: per protocol Transfusion: Given: no  BONE/MINERAL MEDICATIONS: Hectorol/Calcitriol change: no Sensipar/Parsabiv change: no  ACCESS INTERVENTION/CHANGE: no Details:  RECENT LABS: Recent Labs  Lab 08/08/23 0234 08/08/23 0352 08/10/23 0816  HGB  --    < > 10.0*  NA 130*   < > 128*  K 6.0*   < > 5.1  CALCIUM 8.9   < > 9.3  PHOS 8.8*  --   --   ALBUMIN 3.6   < > 3.2*   < > = values in this interval not displayed.   IV ANTIBIOTICS: no Details:  OTHER ANTICOAGULATION: On Coumadin?: no  OTHER/APPTS/LAB ORDERS: - Knows to come to side door for HD and that will wear N95 during HD  D/C Meds to be reconciled by nurse after every discharge.  Completed By: Ozzie Hoyle, PA-C [facility] Pager [phone]   Reviewed by: MD:______ RN_______

## 2023-08-10 NOTE — Progress Notes (Signed)
[facility] Progress Note   Subjective:   Seen in her room - looks much better, back to her usual smiling self. Denies CP/dyspnea. Looks like will be discharged today. She understands the COVID policies/procedures for outpatient HD - will run at the same time, but will need to come in through side door (to avoid sitting in lobby with other patients) and wear a N95 mask during her treatment.  Objective Vitals:   08/10/23 0336 08/10/23 0500 08/10/23 0719 08/10/23 0936  BP: 135/84  128/69 120/68  Pulse:      Resp: 13  19   Temp: 98.3 F (36.8 C)  98.3 F (36.8 C)   TempSrc: Oral  Oral   SpO2:   97%   Weight:  60.2 kg    Height:       Physical Exam General: Well appearing woman, NAD. Room air. Smiling. Heart: RRR; no murmur Lungs: CTA anteriorly Abdomen: soft Extremities: no BLE edema Dialysis Access: LUE AVF + bruit   Additional Objective Labs: Basic Metabolic Panel: Recent Labs  Lab 08/08/23 0234 08/08/23 0352 08/09/23 0402  NA 130* 130* 133*  K 6.0* 5.9* 3.9  CL 88* 88* 90*  CO2 24 23 25   GLUCOSE 169* 184* 182*  BUN 64* 65* 42*  CREATININE 9.69* 9.97* 6.47*  CALCIUM 8.9 8.8* 9.2  PHOS 8.8*  --   --    Liver Function Tests: Recent Labs  Lab 08/08/23 0234 08/08/23 0352 08/09/23 0402  AST 27 24 16   ALT 36 37 28  ALKPHOS 282* 281* 291*  BILITOT 0.7 0.7 0.9  PROT 6.8 6.8 6.6  ALBUMIN 3.6 3.6 3.3*   CBC: Recent Labs  Lab 08/07/23 1649 08/07/23 2008 08/08/23 0352 08/09/23 0402 08/10/23 0816  WBC 5.4  --  8.1 6.4 5.7  HGB 9.3*   < > 8.5* 9.7* 10.0*  HCT 29.4*   < > 28.1* 31.1* 31.6*  MCV 85.2  --  85.2 84.7 82.3  PLT 124*  --  116* 131* 147*   < > = values in this interval not displayed.   Blood Culture    Component Value Date/Time   SDES BLOOD SITE NOT SPECIFIED 08/07/2023 2140   SDES BLOOD SITE NOT SPECIFIED 08/07/2023 2140   SPECREQUEST  08/07/2023 2140    BOTTLES DRAWN AEROBIC AND ANAEROBIC Blood Culture adequate volume    SPECREQUEST  08/07/2023 2140    BOTTLES DRAWN AEROBIC AND ANAEROBIC Blood Culture adequate volume   CULT  08/07/2023 2140    NO GROWTH 3 DAYS Performed at [facility] Lab, [address], [place], [place] [address]    CULT  08/07/2023 2140    NO GROWTH 3 DAYS Performed at [facility] Lab, [address] [place], [place] [address]    REPTSTATUS PENDING 08/07/2023 2140   REPTSTATUS PENDING 08/07/2023 2140   Medications:  sodium chloride     cefTRIAXone (ROCEPHIN)  IV 2 g (08/09/23 2154)    amLODipine  10 mg Oral QHS   azithromycin  500 mg Oral Daily   busPIRone  15 mg Oral QHS   carvedilol  6.25 mg Oral BID WC   Chlorhexidine Gluconate Cloth  6 each Topical Q0600   diltiazem  60 mg Oral Q6H   escitalopram  20 mg Oral Daily   feeding supplement  237 mL Oral BID WC   fluconazole  150 mg Oral Daily   gabapentin  100 mg Oral TID   heparin injection (subcutaneous)  5,000  Units Subcutaneous Q8H   hydrALAZINE  100 mg Oral TID   insulin aspart  0-9 Units Subcutaneous Q4H   insulin aspart  2 Units Subcutaneous TID WC   insulin glargine-yfgn  5 Units Subcutaneous BID   levETIRAcetam  500 mg Oral BID   levothyroxine  175 mcg Oral Q0600   methylPREDNISolone (SOLU-MEDROL) injection  0.5 mg/kg Intravenous Q12H   Followed by   Melene Muller ON 08/11/2023] predniSONE  50 mg Oral Daily   metoCLOPramide  5 mg Oral BID WC   multivitamin  1 tablet Oral QHS   pantoprazole  40 mg Oral Daily   sodium chloride flush  3 mL Intravenous Q12H    Dialysis Orders: MWF at [facility] 3:30hr, 400/800, EDW 60.5kg, 2K/2Ca, AVF, ?no heparin - Mircera IV q 2 weeks - last 8/12 - Calcitriol 0.65mcg PO q HD   Assessment/Plan: 1. Acute Hypoxic RF: Combination of COVID + pulm edema: Somewhat improved after HD, much better today. S/p IV steroids, on Ceftriaxone + azithro for possible COVID pneumonia. 2. ESRD: Missed HD Monday, HD 8/20 here. Back to outpatient unit for next HD on 8/23. 3. HTN/volume: BP better  here - minimal edema. EDW seems to be correct. 4. Hyperkalemia: Lokelma has been given twice, resolved with HD. 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 Secondary hyperparathyroidism:  Ca ok, Phos high - continue home binders - need to clarify with patient. 7. Nutrition: Continue protein supps. 8. T1DM: Insulin, per primary.  Ozzie Hoyle, PA-C 08/10/2023, 9:44 AM  [employer]

## 2023-08-10 NOTE — Discharge Summary (Signed)
Physician Discharge Summary  Angela [name] ZOX:[id number] DOB: Sep 13, 1986 DOA: 08/07/2023  PCP: Marcine Matar, MD  Admit date: 08/07/2023 Discharge date: 08/10/2023 30 Day Unplanned Readmission Risk Score    Flowsheet Row ED to Hosp-Admission (Current) from 08/07/2023 in [facility]  30 Day Unplanned Readmission Risk Score (%) 47.72 Filed at 08/10/2023 0801       This score is the patient's risk of an unplanned readmission within 30 days of being discharged (0 -100%). The score is based on dignosis, age, lab data, medications, orders, and past utilization.   Low:  0-14.9   Medium: 15-21.9   High: 22-29.9   Extreme: 30 and above          Admitted From: Home Disposition: Home  Recommendations for Outpatient Follow-up:  Follow up with PCP in 1-2 weeks Please obtain BMP/CBC in one week Please follow up with your PCP on the following pending results: Unresulted Labs (From admission, onward)     Start     Ordered   08/08/23 0500  Comprehensive metabolic panel  Daily,   R      08/08/23 0300   08/08/23 0500  Magnesium  Daily,   R      08/08/23 0300   08/08/23 0500  CBC  Daily,   R      08/08/23 0302   08/07/23 2023  Legionella Pneumophila Serogp 1 Ur Ag  Once,   R        08/07/23 2023   08/07/23 2023  Strep pneumoniae urinary antigen  Once,   R        08/07/23 2023   08/07/23 2023  Expectorated Sputum Assessment w Gram Stain, Rflx to Resp Cult  Once,   R        08/07/23 2023   Signed and Held  Renal function panel  Once,   R        Signed and Held   Signed and Held  CBC  Once,   R        Signed and Held              Home Health: None Equipment/Devices: None  Discharge Condition: Stable CODE STATUS: Full code Diet recommendation: Low-salt  Subjective: Patient seen and remained, mother at the bedside.  Patient feels a lot better, smiling and looks comfortable on room air.  She said that she has been walking in the room without having any  shortness of breath or hypoxia as well.  She prefers to go home.  Discussed with nephrology and they are okay with plan of discharge today.  Brief/Interim Summary: Angela [name] is a 37 y.o. 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  Admitted for following issues.   Acute hypoxic respiratory failure secondary to acute pulmonary edema/acute on chronic congestive heart failure with preserved ejection fraction in the setting of missing hemodialysis: she received her dialysis yesterday morning and she has been continued improvement feels much better and on room air today.  Cleared by nephrology.  Next Allises tomorrow morning.   COVID-19 infection/?  Superimposed bacterial infection/pneumonia: Chest x-ray and CT chest did not show any groundglass opacities, some concern of subsegmental consolidation.  No leukocytosis  or fever.  Hypoxia secondary to pulm edema.  This is incidental finding, however she was started on remdesivir at the time of admission which she completed 3 doses.  Procalcitonin significantly elevated which could very well be in patients with ESRD however bacterial pneumonia cannot be ruled out completely so she was on Rocephin.  She has been discharged on 4 days of oral Augmentin.   ESRD on hemodialysis: Dialyzed yesterday, next All[facility] tomorrow morning.   Acquired hypothyroidism: Resume Synthroid.   History of seizure: Continue home medications.  She is on Keppra.   Uncontrolled type 1 diabetes mellitus with hyperglycemia, with long-term use of insulin: Resume home irrigations.   Uncontrolled hypertension: Blood pressure much better now.  Resume home  medications.  Discharge plan was discussed with patient and/or family member and they verbalized understanding and agreed with it.  Discharge Diagnoses:  Principal Problem:   Acute respiratory failure (HCC) Active Problems:   Uncontrolled type 1 diabetes mellitus with hypoglycemia, with long-term current use of insulin (HCC)   ESRD on dialysis (HCC)   ICH (intracerebral hemorrhage) (HCC)   Seizure (HCC)   Hypothyroidism   COVID-19 virus infection   Hyperkalemia   Malnutrition of moderate degree    Discharge Instructions   Allergies as of 08/10/2023       Reactions   Bactrim [sulfamethoxazole-trimethoprim] Hives, Itching   Feraheme [ferumoxytol] Itching   Iron Other (See Comments)   intolerance   Zestril [lisinopril] Other (See Comments)   Hyperkalemia         Medication List     STOP taking these medications    fluconazole 150 MG tablet Commonly known as: Diflucan       TAKE these medications    albuterol 108 (90 Base) MCG/ACT inhaler Commonly known as: Proventil HFA Inhale 2 puffs into the lungs every 6 (six) hours as needed for wheezing or shortness of breath.   amLODipine 10 MG tablet Commonly known as: NORVASC Take 1 tablet (10 mg total) by mouth at bedtime.   amoxicillin-clavulanate 500-125 MG tablet Commonly known as: Augmentin Take 1 tablet by mouth 3 (three) times daily for 4 days.   Aranesp (Albumin Free) 100 MCG/0.5ML Sosy injection Generic drug: Darbepoetin Alfa Inject 0.5 mLs (100 mcg total) into the skin every Friday at 6 PM.   busPIRone 15 MG tablet Commonly known as: BUSPAR TAKE ONE TABLET BY MOUTH EVERYDAY AT BEDTIME   carvedilol 6.25 MG tablet Commonly known as: COREG Take 1 tablet (6.25 mg total) by mouth 2 (two) times daily with a meal.   Comfort EZ Pen Needles 32G X 4 MM Misc Generic drug: Insulin Pen Needle USE TO INJECT insulin ONCE DAILY AS DIRECTED   Dexcom G6 Sensor Misc APPLY 1 SENSOR EVERY 10 DAYS   Dexcom G6  Transmitter Misc See admin instructions.   diltiazem 60 MG tablet Commonly known as: CARDIZEM Take 1 tablet (60 mg total) by mouth every 6 (six) hours.   escitalopram 20 MG tablet Commonly known as: LEXAPRO TAKE ONE TABLET BY MOUTH ONCE DAILY   gabapentin 100 MG capsule Commonly known as: NEURONTIN Take 1 capsule (100 mg total) by mouth 3 (three) times daily.   hydrALAZINE 100 MG tablet Commonly known as: APRESOLINE Take 1 tablet (100 mg total) by mouth 3 (three) times daily.   Insulin Syringe-Needle U-100 31G X 15/64" 1 ML Misc Commonly known as: BD Insulin Syringe Ultrafine Used to give daily insulin injections.   lanthanum 1000 MG chewable tablet Commonly known  as: FOSRENOL Chew 1 tablet (1,000 mg total) by mouth with breakfast, with lunch, and with evening meal.   levETIRAcetam 500 MG tablet Commonly known as: KEPPRA Take 1 tablet (500 mg total) by mouth 2 (two) times daily. With 250mg (1/2 tab) supplement after dialysis.   levothyroxine 175 MCG tablet Commonly known as: SYNTHROID Take 175 mcg by mouth daily before breakfast.   levothyroxine 200 MCG tablet Commonly known as: SYNTHROID Take 1 tablet (200 mcg total) by mouth daily before breakfast. Stop the 175 mcg dose   Lokelma 5 g packet Generic drug: sodium zirconium cyclosilicate Take 1 packet by mouth daily.   metoCLOPramide 5 MG tablet Commonly known as: REGLAN Take 1 tablet (5 mg total) by mouth 2 (two) times daily with a meal. What changed: when to take this   multivitamin Tabs tablet Take 1 tablet by mouth at bedtime.   NovoLOG FlexPen 100 UNIT/ML FlexPen Generic drug: insulin aspart Inject 2-3 Units into the skin 3 (three) times daily with meals. Per sliding scale   ondansetron 4 MG disintegrating tablet Commonly known as: ZOFRAN-ODT 4mg  ODT q4 hours prn nausea/vomit What changed:  how much to take how to take this when to take this reasons to take this additional instructions    pantoprazole 40 MG tablet Commonly known as: PROTONIX Take 1 tablet (40 mg total) by mouth daily.   Evaristo Bury FlexTouch 100 UNIT/ML FlexTouch Pen Generic drug: insulin degludec Inject 5 Units into the skin 2 (two) times daily.        Follow-up Information     Marcine Matar, MD Follow up in 1 week(s).   Specialty: Internal Medicine Contact information: [address] [place] [address] [phone]                Allergies  Allergen Reactions   Bactrim [Sulfamethoxazole-Trimethoprim] Hives and Itching   Feraheme [Ferumoxytol] Itching   Iron Other (See Comments)    intolerance   Zestril [Lisinopril] Other (See Comments)    Hyperkalemia     Consultations: Nephrology   Procedures/Studies: CT Angio Chest PE W and/or Wo Contrast  Result Date: 08/07/2023 CLINICAL DATA:  Chest pain, hypoxia. EXAM: CT ANGIOGRAPHY CHEST WITH CONTRAST TECHNIQUE: Multidetector CT imaging of the chest was performed using the standard protocol during bolus administration of intravenous contrast. Multiplanar CT image reconstructions and MIPs were obtained to evaluate the vascular anatomy. RADIATION DOSE REDUCTION: This exam was performed according to the departmental dose-optimization program which includes automated exposure control, adjustment of the mA and/or kV according to patient size and/or use of iterative reconstruction technique. CONTRAST:  75mL OMNIPAQUE IOHEXOL 350 MG/ML SOLN COMPARISON:  08/01/2022 FINDINGS: Cardiovascular: Cardiomegaly. No pericardial effusion. No filling defects in pulmonary arteries to suggest PE. 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 No evidence of aortic aneurysm or dissection. Mediastinum/Nodes: No suspicious adenopathy. Unremarkable esophagus and tracheobronchial tree. Lungs/Pleura: Dependent basilar subsegmental atelectasis lower lobes. Diffuse ground-glass opacities and interstitial  prominence consistent with pulmonary edema. Overall findings suggest CHF. There is a small right-sided pleural effusion. No pneumothorax. Upper Abdomen: No acute abnormality. Musculoskeletal: No chest wall abnormality. No acute or significant osseous findings. Review of the MIP images confirms the above findings. IMPRESSION: 1. No PE, aneurysm or dissection. 2. Findings consistent with CHF. 3. 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 4. Dependent bibasilar subsegmental atelectasis or consolidation. Electronically Signed   By: Layla Maw M.D.   On: 08/07/2023 20:17  DG Chest 2 View  Result Date: 08/07/2023 CLINICAL DATA:  Chest pain.  Hypoxia. EXAM: CHEST - 2 VIEW COMPARISON:  Radiograph 02/02/2023 FINDINGS: The heart is enlarged. Diffuse interstitial thickening consistent with pulmonary edema. Small to moderate bilateral pleural effusions and fluid in the fissures. No pneumothorax or focal airspace disease. No acute osseous findings. IMPRESSION: CHF.  Cardiomegaly with pulmonary edema and pleural effusions. Electronically Signed   By: Narda Rutherford M.D.   On: 08/07/2023 18:18     Discharge Exam: Vitals:   08/10/23 0719 08/10/23 0936  BP: 128/69 120/68  Pulse:    Resp: 19   Temp: 98.3 F (36.8 C)   SpO2: 97%    Vitals:   08/10/23 0336 08/10/23 0500 08/10/23 0719 08/10/23 0936  BP: 135/84  128/69 120/68  Pulse:      Resp: 13  19   Temp: 98.3 F (36.8 C)  98.3 F (36.8 C)   TempSrc: Oral  Oral   SpO2:   97%   Weight:  60.2 kg    Height:        General: Pt is alert, awake, not in acute distress Cardiovascular: RRR, S1/S2 +, no rubs, no gallops Respiratory: CTA bilaterally, no wheezing, no rhonchi Abdominal: Soft, NT, ND, bowel sounds + Extremities: no edema, no cyanosis    The results of significant diagnostics from this hospitalization (including imaging, microbiology, ancillary and laboratory) are listed below for reference.      Microbiology: Recent Results (from the past 240 hour(s))  Resp panel by RT-PCR (RSV, Flu A&B, Covid) Anterior Nasal Swab     Status: Abnormal   Collection Time: 08/07/23  5:00 PM   Specimen: Anterior Nasal Swab  Result Value Ref Range Status   SARS Coronavirus 2 by RT PCR POSITIVE (A) NEGATIVE Final   Influenza A by PCR NEGATIVE NEGATIVE Final   Influenza B by PCR NEGATIVE NEGATIVE Final    Comment: (NOTE) The Xpert Xpress SARS-CoV-2/FLU/RSV plus assay is intended as an aid in the diagnosis of influenza from Nasopharyngeal swab specimens and should not be used as a sole basis for treatment. Nasal washings and aspirates are unacceptable for Xpert Xpress SARS-CoV-2/FLU/RSV testing.  Fact Sheet for Patients: BloggerCourse.com  Fact Sheet for Healthcare Providers: SeriousBroker.it  This test is not yet approved or cleared by the [place] FDA and has been authorized for detection and/or diagnosis of SARS-CoV-2 by FDA under an Emergency Use Authorization (EUA). This EUA will remain in effect (meaning this test can be used) for the duration of the COVID-19 declaration under Section 564(b)(1) of the Act, 21 U.S.C. section 360bbb-3(b)(1), unless the authorization is terminated or revoked.     Resp Syncytial Virus by PCR NEGATIVE NEGATIVE Final    Comment: (NOTE) Fact Sheet for Patients: BloggerCourse.com  Fact Sheet for Healthcare Providers: SeriousBroker.it  This test is not yet approved or cleared by the [place] FDA and has been authorized for detection and/or diagnosis of SARS-CoV-2 by FDA under an Emergency Use Authorization (EUA). This EUA will remain in effect (meaning this test can be used) for the duration of the COVID-19 declaration under Section 564(b)(1) of the Act, 21 U.S.C. section 360bbb-3(b)(1), unless the authorization is terminated  or revoked.  Performed at [facility], [address] [place], [place] [address]   Culture, blood (routine x 2) Call MD if unable to obtain prior to antibiotics being given     Status: None (Preliminary result)   Collection Time: 08/07/23  9:40  PM   Specimen: BLOOD  Result Value Ref Range Status   Specimen Description BLOOD SITE NOT SPECIFIED  Final   Special Requests   Final    BOTTLES DRAWN AEROBIC AND ANAEROBIC Blood Culture adequate volume   Culture   Final    NO GROWTH 3 DAYS Performed at [facility], [address], [place], [place] [address]    Report Status PENDING  Incomplete  Culture, blood (routine x 2) Call MD if unable to obtain prior to antibiotics being given     Status: None (Preliminary result)   Collection Time: 08/07/23  9:40 PM   Specimen: BLOOD  Result Value Ref Range Status   Specimen Description BLOOD SITE NOT SPECIFIED  Final   Special Requests   Final    BOTTLES DRAWN AEROBIC AND ANAEROBIC Blood Culture adequate volume   Culture   Final    NO GROWTH 3 DAYS Performed at [facility], [address], [place], [place] [address]    Report Status PENDING  Incomplete     Labs: BNP (last 3 results) No results for input(s): "BNP" in the last 8760 hours. Basic Metabolic Panel: Recent Labs  Lab 08/07/23 2140 08/08/23 0234 08/08/23 0352 08/09/23 0402 08/10/23 0816  NA 131* 130* 130* 133* 128*  K 5.7* 6.0* 5.9* 3.9 5.1  CL 88* 88* 88* 90* 87*  CO2 24 24 23 25 22   GLUCOSE 166* 169* 184* 182* 342*  BUN 65* 64* 65* 42* 63*  CREATININE 9.69* 9.69* 9.97* 6.47* 7.57*  CALCIUM 8.5* 8.9 8.8* 9.2 9.3  MG  --  2.1 2.2 1.8 1.9  PHOS  --  8.8*  --   --   --    Liver Function Tests: Recent Labs  Lab 08/07/23 2140 08/08/23 0234 08/08/23 0352 08/09/23 0402 08/10/23 0816  AST 30 27 24 16  14*  ALT 35 36 37 28 20  ALKPHOS 267* 282* 281* 291* 236*  BILITOT 1.1 0.7 0.7 0.9 0.8  PROT 6.4* 6.8 6.8 6.6 6.2*  ALBUMIN 3.4* 3.6 3.6 3.3* 3.2*    No results for input(s): "LIPASE", "AMYLASE" in the last 168 hours. No results for input(s): "AMMONIA" in the last 168 hours. CBC: Recent Labs  Lab 08/07/23 1649 08/07/23 2008 08/08/23 0352 08/09/23 0402 08/10/23 0816  WBC 5.4  --  8.1 6.4 5.7  HGB 9.3* 9.9* 8.5* 9.7* 10.0*  HCT 29.4* 29.0* 28.1* 31.1* 31.6*  MCV 85.2  --  85.2 84.7 82.3  PLT 124*  --  116* 131* 147*   Cardiac Enzymes: Recent Labs  Lab 08/07/23 2140  CKTOTAL 120   BNP: Invalid input(s): "POCBNP" CBG: Recent Labs  Lab 08/09/23 1647 08/09/23 2015 08/10/23 0031 08/10/23 0332 08/10/23 0718  GLUCAP 195* 306* 320* 248* 260*   D-Dimer No results for input(s): "DDIMER" in the last 72 hours. Hgb A1c No results for input(s): "HGBA1C" in the last 72 hours. Lipid Profile No results for input(s): "CHOL", "HDL", "LDLCALC", "TRIG", "CHOLHDL", "LDLDIRECT" in the last 72 hours. Thyroid function studies Recent Labs    08/08/23 0352  TSH 0.413   Anemia work up No results for input(s): "VITAMINB12", "FOLATE", "FERRITIN", "TIBC", "IRON", "RETICCTPCT" in the last 72 hours. Urinalysis    Component Value Date/Time   COLORURINE YELLOW 02/11/2023 1148   APPEARANCEUR HAZY (A) 02/11/2023 1148   APPEARANCEUR Clear 04/30/2019 1141   LABSPEC 1.011 02/11/2023 1148   PHURINE 8.0 02/11/2023 1148   GLUCOSEU 50 (A) 02/11/2023 1148  GLUCOSEU 100 (A) 10/14/2009 2309   HGBUR NEGATIVE 02/11/2023 1148   BILIRUBINUR NEGATIVE 02/11/2023 1148   BILIRUBINUR Negative 04/30/2019 1141   BILIRUBINUR negative 12/30/2016 1617   KETONESUR NEGATIVE 02/11/2023 1148   PROTEINUR >=300 (A) 02/11/2023 1148   UROBILINOGEN 0.2 07/19/2018 1351   UROBILINOGEN 0.2 10/24/2015 0029   NITRITE NEGATIVE 02/11/2023 1148   LEUKOCYTESUR TRACE (A) 02/11/2023 1148   Sepsis Labs Recent Labs  Lab 08/07/23 1649 08/08/23 0352 08/09/23 0402 08/10/23 0816  WBC 5.4 8.1 6.4 5.7   Microbiology Recent Results (from the past 240 hour(s))  Resp  panel by RT-PCR (RSV, Flu A&B, Covid) Anterior Nasal Swab     Status: Abnormal   Collection Time: 08/07/23  5:00 PM   Specimen: Anterior Nasal Swab  Result Value Ref Range Status   SARS Coronavirus 2 by RT PCR POSITIVE (A) NEGATIVE Final   Influenza A by PCR NEGATIVE NEGATIVE Final   Influenza B by PCR NEGATIVE NEGATIVE Final    Comment: (NOTE) The Xpert Xpress SARS-CoV-2/FLU/RSV plus assay is intended as an aid in the diagnosis of influenza from Nasopharyngeal swab specimens and should not be used as a sole basis for treatment. Nasal washings and aspirates are unacceptable for Xpert Xpress SARS-CoV-2/FLU/RSV testing.  Fact Sheet for Patients: BloggerCourse.com  Fact Sheet for Healthcare Providers: SeriousBroker.it  This test is not yet approved or cleared by the [place] FDA and has been authorized for detection and/or diagnosis of SARS-CoV-2 by FDA under an Emergency Use Authorization (EUA). This EUA will remain in effect (meaning this test can be used) for the duration of the COVID-19 declaration under Section 564(b)(1) of the Act, 21 U.S.C. section 360bbb-3(b)(1), unless the authorization is terminated or revoked.     Resp Syncytial Virus by PCR NEGATIVE NEGATIVE Final    Comment: (NOTE) Fact Sheet for Patients: BloggerCourse.com  Fact Sheet for Healthcare Providers: SeriousBroker.it  This test is not yet approved or cleared by the [place] FDA and has been authorized for detection and/or diagnosis of SARS-CoV-2 by FDA under an Emergency Use Authorization (EUA). This EUA will remain in effect (meaning this test can be used) for the duration of the COVID-19 declaration under Section 564(b)(1) of the Act, 21 U.S.C. section 360bbb-3(b)(1), unless the authorization is terminated or revoked.  Performed at [facility], [address], [place],  [place] [address]   Culture, blood (routine x 2) Call MD if unable to obtain prior to antibiotics being given     Status: None (Preliminary result)   Collection Time: 08/07/23  9:40 PM   Specimen: BLOOD  Result Value Ref Range Status   Specimen Description BLOOD SITE NOT SPECIFIED  Final   Special Requests   Final    BOTTLES DRAWN AEROBIC AND ANAEROBIC Blood Culture adequate volume   Culture   Final    NO GROWTH 3 DAYS Performed at [facility], [address], [place], [place] [address]    Report Status PENDING  Incomplete  Culture, blood (routine x 2) Call MD if unable to obtain prior to antibiotics being given     Status: None (Preliminary result)   Collection Time: 08/07/23  9:40 PM   Specimen: BLOOD  Result Value Ref Range Status   Specimen Description BLOOD SITE NOT SPECIFIED  Final   Special Requests   Final    BOTTLES DRAWN AEROBIC AND ANAEROBIC Blood Culture adequate volume   Culture   Final    NO GROWTH 3 DAYS  Performed at [facility], [address], [place], [place] [address]    Report Status PENDING  Incomplete    FURTHER DISCHARGE INSTRUCTIONS:   Get Medicines reviewed and adjusted: Please take all your medications with you for your next visit with your Primary MD   Laboratory/radiological data: Please request your Primary MD to go over all hospital tests and procedure/radiological results at the follow up, please ask your Primary MD to get all Hospital records sent to his/her office.   In some cases, they will be blood work, cultures and biopsy results pending at the time of your discharge. Please request that your primary care M.D. goes through all the records of your hospital data and follows up on these results.   Also Note the following: If you experience worsening of your admission symptoms, develop shortness of breath, life threatening emergency, suicidal or homicidal thoughts you must seek medical attention immediately by calling 911 or calling your  MD immediately  if symptoms less severe.   You must read complete instructions/literature along with all the possible adverse reactions/side effects for all the Medicines you take and that have been prescribed to you. Take any new Medicines after you have completely understood and accpet all the possible adverse reactions/side effects.    Do not drive when taking Pain medications or sleeping medications (Benzodaizepines)   Do not take more than prescribed Pain, Sleep and Anxiety Medications. It is not advisable to combine anxiety,sleep and pain medications without talking with your primary care practitioner   Special Instructions: If you have smoked or chewed Tobacco  in the last 2 yrs please stop smoking, stop any regular Alcohol  and or any Recreational drug use.   Wear Seat belts while driving.   Please note: You were cared for by a hospitalist during your hospital stay. Once you are discharged, your primary care physician will handle any further medical issues. Please note that NO REFILLS for any discharge medications will be authorized once you are discharged, as it is imperative that you return to your primary care physician (or establish a relationship with a primary care physician if you do not have one) for your post hospital discharge needs so that they can reassess your need for medications and monitor your lab values  Time coordinating discharge: Over 30 minutes  SIGNED:   Hughie Closs, MD  [facility] 08/10/2023, 9:52 AM *Please note that this is a verbal dictation therefore any spelling or grammatical errors are due to the "Dragon Medical One" system interpretation. If 7PM-7AM, please contact night-coverage [email or web address]

## 2023-08-10 NOTE — TOC Transition Note (Signed)
Transition of Care [facility]) - CM/SW Discharge Note   Patient Details  Name: Angela [name] MRN: [id number] Date of Birth: 03/04/1986  Transition of Care [facility]) CM/SW Contact:  Harriet Masson, RN Phone Number: 08/10/2023, 11:30 AM   Clinical Narrative:    Patient stable for discharge.  This RNCM was unable to speak to patient due to patient never answered the room phone or returned voicemail.     Final next level of care: Home/Self Care Barriers to Discharge: Barriers Resolved   Patient Goals and CMS Choice      Discharge Placement    home                     Discharge Plan and Services Additional resources added to the After Visit Summary for                                       Social Determinants of Health (SDOH) Interventions SDOH Screenings   Food Insecurity: No Food Insecurity (08/08/2023)  Housing: Low Risk  (08/08/2023)  Transportation Needs: No Transportation Needs (08/08/2023)  Utilities: Not At Risk (08/08/2023)  Depression (PHQ2-9): Medium Risk (07/04/2023)  Financial Resource Strain: Medium Risk (01/20/2022)  Physical Activity: Inactive (10/29/2019)  Tobacco Use: Medium Risk (08/07/2023)     Readmission Risk Interventions    10/18/2021    1:09 PM  Readmission Risk Prevention Plan  Transportation Screening Complete  PCP or Specialist Appt within 3-5 Days Complete  HRI or Home Care Consult Complete  Social Work Consult for Recovery Care Planning/Counseling Complete  Palliative Care Screening Complete  Medication Review Ocean[facility]) Referral to Pharmacy

## 2023-08-10 NOTE — Progress Notes (Signed)
Advised by renal PA that pt will d/c to home today. Contacted [facility] to advise clinic of pt's d/c today and that pt should resume care tomorrow. Clinic aware of pt's covid status.   Olivia Canter Renal Navigator [phone]

## 2023-08-12 ENCOUNTER — Telehealth: Payer: Self-pay | Admitting: Nephrology

## 2023-08-12 LAB — CULTURE, BLOOD (ROUTINE X 2)
Culture: NO GROWTH
Culture: NO GROWTH
Special Requests: ADEQUATE
Special Requests: ADEQUATE

## 2023-08-12 NOTE — Telephone Encounter (Signed)
Transition of Care - Initial Contact from Inpatient Facility  Date of discharge: 08/10/23 Date of contact: 08/12/23  Method: Phone Spoke to: Patient  Patient contacted to discuss transition of care from recent inpatient hospitalization. Patient was admitted to [facility] from 08/07/23 to 08/10/23... with discharge diagnosis of  .1.Acute Hypoxic RF: Combination of COVID + pulm edema: .[name]  The discharge medication list was reviewed. Patient understands the changes and has no concerns.   Patient will return to his/her outpatient HD unit on:   No other concerns at this time.

## 2023-08-14 ENCOUNTER — Telehealth: Payer: Self-pay

## 2023-08-14 NOTE — Transitions of Care (Post Inpatient/ED Visit) (Signed)
08/14/2023  Name: Angela [name] MRN: [id number] DOB: 1986-09-06  Today's TOC FU Call Status: Today's TOC FU Call Status:: Successful TOC FU Call Completed TOC FU Call Complete Date: 08/14/23 Patient's Name and Date of Birth confirmed.  Transition Care Management Follow-up Telephone Call Date of Discharge: 08/10/23 Discharge Facility: [facility]) Type of Discharge: Inpatient Admission Primary Inpatient Discharge Diagnosis:: acute repsiratory failure How have you been since you were released from the hospital?: Worse (She stated that she has pain " all over , whole body."  She has not checked her temperature but feels she has a fever.) Any questions or concerns?: Yes Patient Questions/Concerns:: The patient said her mother wanted to speak with me and she put her mother, Angela [name], on the phone. Angela [name] said that her daughter does " nothing" for herself.  Angela [name] said she needs to make sure her daughter takes her medications or she (patient) will forget. Angela [name] said she has been taking her dialysis and has to pack up off of her daughter's belongings, like her insulin,  when she is leaving the house  She explained that since the stroke, her daughter has not been herself. She went on to say that she becomes overwhelmed or overstimulated and cannot function, she walks "side to side" ad her breathing becomes heavy and she will say she can't breathe.  She stated that doctors think she is " fine" but she really is not fiine. Angela [name] said that her daughter wants to drive but when she tried to drive, sh  said that the car is " too heavy."  When I asked who told her she can drive, Angela [name] said the doctor but she is not sure which one.  She asled that I put this information in the patient's medical record. I told her that I would and that I would share it with Dr Laural Benes. Patient Questions/Concerns Addressed: Notified Provider of Patient Questions/Concerns  Items Reviewed: Did you receive and understand  the discharge instructions provided?: Yes Medications obtained,verified, and reconciled?: Partial Review Completed Reason for Partial Mediation Review: She said she has all medications and did not have any questions about the med regime and did not need to review the med list.  She has a Dexcom meter Any new allergies since your discharge?: No Dietary orders reviewed?: No Do you have support at home?: Yes People in Home: parent(s) Name of Support/Comfort Primary Source: her mother  Medications Reviewed Today: Medications Reviewed Today   Medications were not reviewed in this encounter     Home Care and Equipment/Supplies: Were Home Health Services Ordered?: No Any new equipment or medical supplies ordered?: No  Functional Questionnaire: Do you need assistance with bathing/showering or dressing?: No Do you need assistance with meal preparation?:  (her mother has been helping her) Do you need assistance with eating?: No Do you have difficulty maintaining continence: No Do you need assistance with getting out of bed/getting out of a chair/moving?: No Do you have difficulty managing or taking your medications?: Yes (her mother manages her medications)  Follow up appointments reviewed: PCP Follow-up appointment confirmed?: Yes Date of PCP follow-up appointment?: 09/05/23 Follow-up Provider: Dr Laural Benes - I offered an appointment to be seen sooner with another provider but she only wanted to see Dr [facility] Follow-up appointment confirmed?: Yes Date of Specialist follow-up appointment?: 08/15/23 Follow-Up Specialty Provider:: GI, and 09/19/2023- Neurology.  she also attends dialysis: M/W/F Do you need transportation to your follow-up appointment?: No (her mother has been driving  het to appointments) Do you understand care options if your condition(s) worsen?: Yes-patient verbalized understanding    SIGNATURE Robyne Peers, RN

## 2023-08-14 NOTE — Telephone Encounter (Signed)
From the [facility] call:   Ht patient stated she is feeling worse since she left the hospital  She stated that she has pain " all over , whole body."  She has not checked her temperature but feels she has a fever. She has dialysis this afternoon and said they are aware of how she is feeling.    The patient said her mother wanted to speak with me and she put her mother, Toniann Fail, on the phone. [name] said that her daughter does " nothing" for herself.  [name] said she needs to make sure her daughter takes her medications or she (patient) will forget. [name] said she has been taking her dialysis and has to pack up all of her daughter's belongings, like her insulin,  when she is leaving the house  She explained that since the stroke, her daughter has not been herself. She went on to say that she becomes overwhelmed or overstimulated and cannot function, she walks "side to side" ad her breathing becomes heavy and she will say she can't breathe.  She stated that doctors think she is " fine" but she really is not fiine. [name] said that her daughter wants to drive but when she tried to drive, she  said that the car is " too heavy."  When I asked who told her she can drive, [name] said the doctor but she is not sure which one.  She asled that I put this information in the patient's medical record. I told her that I would and that I would share it with Dr Laural Benes.       Follow-up Provider: Dr Laural Benes -  09/05/2023. I offered an appointment to be seen sooner with another provider but she only wanted to see Dr Laural Benes

## 2023-08-15 ENCOUNTER — Ambulatory Visit: Payer: MEDICAID | Admitting: Gastroenterology

## 2023-08-16 MED ORDER — METHOCARBAMOL 750 MG PO TABS: 750.0000 mg | ORAL_TABLET | Freq: Two times a day (BID) | ORAL | 1 refills | Status: DC | PRN

## 2023-08-16 NOTE — Addendum Note (Signed)
Addended byHoy Register on: 08/16/2023 11:50 AM   Modules accepted: Orders

## 2023-08-16 NOTE — Telephone Encounter (Signed)
I have sent Robaxin for pain to her pharmacy. Ability to drive needs to be determined via an evaluation by PCP.

## 2023-08-16 NOTE — Telephone Encounter (Signed)
Call placed to patient to inform her that Dr Alvis Lemmings  sent rx for  Robaxin for pain to her pharmacy.  Her ability to drive needs to be determined via an evaluation by PCP.  Message left with call back requested

## 2023-08-17 ENCOUNTER — Ambulatory Visit: Payer: Medicaid Other | Admitting: Neurology

## 2023-08-22 ENCOUNTER — Other Ambulatory Visit (INDEPENDENT_AMBULATORY_CARE_PROVIDER_SITE_OTHER): Payer: MEDICAID

## 2023-08-22 ENCOUNTER — Encounter: Payer: Self-pay | Admitting: Nurse Practitioner

## 2023-08-22 ENCOUNTER — Ambulatory Visit (INDEPENDENT_AMBULATORY_CARE_PROVIDER_SITE_OTHER): Payer: MEDICAID | Admitting: Nurse Practitioner

## 2023-08-22 VITALS — BP 126/70 | HR 69 | Ht 64.0 in | Wt 136.0 lb

## 2023-08-22 DIAGNOSIS — R7989 Other specified abnormal findings of blood chemistry: Secondary | ICD-10-CM | POA: Diagnosis not present

## 2023-08-22 DIAGNOSIS — E8889 Other specified metabolic disorders: Secondary | ICD-10-CM | POA: Diagnosis not present

## 2023-08-22 NOTE — Patient Instructions (Signed)
Your provider has requested that you go to the basement level for lab work before leaving today. Press "B" on the elevator. The lab is located at the first door on the left as you exit the elevator.   Due to recent changes in healthcare laws, you may see the results of your imaging and laboratory studies on MyChart before your provider has had a chance to review them.  We understand that in some cases there may be results that are confusing or concerning to you. Not all laboratory results come back in the same time frame and the provider may be waiting for multiple results in order to interpret others.  Please give [place] 48 hours in order for your provider to thoroughly review all the results before contacting the office for clarification of your results.    _______________________________________________________  If your blood pressure at your visit was 140/90 or greater, please contact your primary care physician to follow up on this.  _______________________________________________________  If you are age 38 or older, your body mass index should be between 23-30. Your Body mass index is 23.34 kg/m. If this is out of the aforementioned range listed, please consider follow up with your Primary Care Provider.  If you are age 24 or younger, your body mass index should be between 19-25. Your Body mass index is 23.34 kg/m. If this is out of the aformentioned range listed, please consider follow up with your Primary Care Provider.   ________________________________________________________  The [facility] providers would like to encourage you to use [facility] to communicate with providers for non-urgent requests or questions.  Due to long hold times on the telephone, sending your provider a message by [facility] may be a faster and more efficient way to get a response.  Please allow 48 business hours for a response.  Please remember that this is for non-urgent requests.   _______________________________________________________   I appreciate the  opportunity to care for you  Thank You   Midge Minium
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
## 2023-08-23 LAB — IBC + FERRITIN
Ferritin: 489.9 ng/mL — ABNORMAL HIGH (ref 10.0–291.0)
Iron: 40 ug/dL — ABNORMAL LOW (ref 42–145)
Saturation Ratios: 17.3 % — ABNORMAL LOW (ref 20.0–50.0)
TIBC: 231 ug/dL — ABNORMAL LOW (ref 250.0–450.0)
Transferrin: 165 mg/dL — ABNORMAL LOW (ref 212.0–360.0)

## 2023-08-23 NOTE — Telephone Encounter (Signed)
I tried to reach the patient again to inform her that Dr Alvis Lemmings  sent rx for  Robaxin for pain to her pharmacy.  Her ability to drive needs to be determined via an evaluation by PCP.  Message left with call back requested

## 2023-08-24 ENCOUNTER — Other Ambulatory Visit: Payer: Self-pay | Admitting: Internal Medicine

## 2023-08-24 ENCOUNTER — Encounter: Payer: Self-pay | Admitting: Internal Medicine

## 2023-08-24 MED ORDER — NOVOLOG FLEXPEN 100 UNIT/ML ~~LOC~~ SOPN: 2.0000 [IU] | PEN_INJECTOR | Freq: Three times a day (TID) | SUBCUTANEOUS | 6 refills | Status: DC

## 2023-08-25 ENCOUNTER — Encounter: Payer: Self-pay | Admitting: Nurse Practitioner

## 2023-08-26 LAB — ANTI-SMOOTH MUSCLE ANTIBODY, IGG: Actin (Smooth Muscle) Antibody (IGG): <20 U (ref <20)

## 2023-08-26 LAB — ALPHA-1-ANTITRYPSIN: A-1 Antitrypsin, Ser: 149 mg/dL (ref 83–199)

## 2023-08-26 LAB — CERULOPLASMIN: Ceruloplasmin: 17 mg/dL (ref 14–48)

## 2023-08-26 LAB — ANA: Anti Nuclear Antibody (ANA): NEGATIVE

## 2023-08-26 LAB — HEPATITIS A ANTIBODY, TOTAL: Hepatitis A AB,Total: NONREACTIVE

## 2023-08-26 LAB — IGA: Immunoglobulin A: 154 mg/dL (ref 47–310)

## 2023-08-26 LAB — TISSUE TRANSGLUTAMINASE, IGA: (tTG) Ab, IgA: <1 U/mL

## 2023-08-26 NOTE — Progress Notes (Signed)
Agree with assessment/plan.  Raj Gupta, MD [facility] [phone]  

## 2023-08-30 ENCOUNTER — Other Ambulatory Visit: Payer: Self-pay | Admitting: *Deleted

## 2023-08-30 DIAGNOSIS — R7989 Other specified abnormal findings of blood chemistry: Secondary | ICD-10-CM

## 2023-08-30 DIAGNOSIS — E1043 Type 1 diabetes mellitus with diabetic autonomic (poly)neuropathy: Secondary | ICD-10-CM

## 2023-08-30 DIAGNOSIS — I10 Essential (primary) hypertension: Secondary | ICD-10-CM

## 2023-08-31 ENCOUNTER — Ambulatory Visit
Admission: RE | Admit: 2023-08-31 | Discharge: 2023-08-31 | Disposition: A | Discharge status: Home / Self Care | Payer: MEDICAID | Source: Ambulatory Visit | Attending: Neurology | Admitting: Neurology

## 2023-08-31 ENCOUNTER — Other Ambulatory Visit: Payer: Self-pay | Admitting: Internal Medicine

## 2023-08-31 DIAGNOSIS — R200 Anesthesia of skin: Secondary | ICD-10-CM | POA: Diagnosis not present

## 2023-08-31 DIAGNOSIS — M542 Cervicalgia: Secondary | ICD-10-CM | POA: Diagnosis not present

## 2023-08-31 DIAGNOSIS — R29898 Other symptoms and signs involving the musculoskeletal system: Secondary | ICD-10-CM

## 2023-08-31 DIAGNOSIS — R202 Paresthesia of skin: Secondary | ICD-10-CM | POA: Diagnosis not present

## 2023-08-31 DIAGNOSIS — M5412 Radiculopathy, cervical region: Secondary | ICD-10-CM | POA: Diagnosis not present

## 2023-08-31 DIAGNOSIS — G8929 Other chronic pain: Secondary | ICD-10-CM

## 2023-08-31 MED ORDER — COMFORT EZ PEN NEEDLES 32G X 4 MM MISC: 3 refills | Status: DC

## 2023-09-05 ENCOUNTER — Ambulatory Visit: Payer: MEDICAID | Attending: Internal Medicine | Admitting: Internal Medicine

## 2023-09-05 ENCOUNTER — Encounter: Payer: Self-pay | Admitting: Internal Medicine

## 2023-09-05 VITALS — BP 129/80 | HR 71 | Ht 64.0 in | Wt 132.0 lb

## 2023-09-05 DIAGNOSIS — Z23 Encounter for immunization: Secondary | ICD-10-CM

## 2023-09-05 DIAGNOSIS — F320 Major depressive disorder, single episode, mild: Secondary | ICD-10-CM

## 2023-09-05 DIAGNOSIS — F1290 Cannabis use, unspecified, uncomplicated: Secondary | ICD-10-CM

## 2023-09-05 DIAGNOSIS — Z992 Dependence on renal dialysis: Secondary | ICD-10-CM

## 2023-09-05 DIAGNOSIS — I120 Hypertensive chronic kidney disease with stage 5 chronic kidney disease or end stage renal disease: Secondary | ICD-10-CM

## 2023-09-05 DIAGNOSIS — E1029 Type 1 diabetes mellitus with other diabetic kidney complication: Secondary | ICD-10-CM | POA: Diagnosis not present

## 2023-09-05 DIAGNOSIS — E11649 Type 2 diabetes mellitus with hypoglycemia without coma: Secondary | ICD-10-CM

## 2023-09-05 DIAGNOSIS — N186 End stage renal disease: Secondary | ICD-10-CM

## 2023-09-05 DIAGNOSIS — Z7984 Long term (current) use of oral hypoglycemic drugs: Secondary | ICD-10-CM

## 2023-09-05 DIAGNOSIS — Z09 Encounter for follow-up examination after completed treatment for conditions other than malignant neoplasm: Secondary | ICD-10-CM | POA: Diagnosis not present

## 2023-09-05 DIAGNOSIS — G40909 Epilepsy, unspecified, not intractable, without status epilepticus: Secondary | ICD-10-CM | POA: Diagnosis not present

## 2023-09-05 DIAGNOSIS — E221 Hyperprolactinemia: Secondary | ICD-10-CM

## 2023-09-05 LAB — GLUCOSE, POCT (MANUAL RESULT ENTRY)
POC Glucose: 62 mg/dL — AB (ref 70–99)
POC Glucose: 86 mg/dL (ref 70–99)

## 2023-09-05 LAB — POCT GLYCOSYLATED HEMOGLOBIN (HGB A1C): HbA1c, POC (controlled diabetic range): 8.1 % — AB (ref 0.0–7.0)

## 2023-09-05 NOTE — Progress Notes (Unsigned)
Patient ID: Angela [name], female    DOB: [birth year]  MRN: [id number]  CC: Hospitalization Follow-up [facility] f/u. Herold Harms all meds to be transferred to [facility] [address] /Requesting to have all lab work done today instead of tomorrow./Flu vax administered on 09/05/23 - C.A.)   Subjective: Angela [name] is a 37 y.o. female who presents for hosp f/u management. Her concerns today include:  Pt with hx of DM type 1 with neuropathy, gastroparesis and nephropathy, HTN, ESRD on HD, hemorrhagic stroke 01/2023 , ACD/IDA (on Aranesp inj weekly), hypothyroid, HL, migraines, thyroid nodules (12/2022 at [facility] bx of isthmus nodule and RLL path -follicular lesion of undetermined significance, neg Afirma), MDD   Patient Active Problem List   Diagnosis Date Noted   Steatosis (HCC) 08/22/2023   Malnutrition of moderate degree 08/10/2023   Acute respiratory failure (HCC) 08/07/2023   COVID-19 virus infection 08/07/2023   Hyperkalemia 08/07/2023   Hypertensive intracerebral hemorrhage (HCC) 08/01/2023   Numbness and tingling in right hand 08/01/2023   Chronic neck pain with abnormal neurologic examination 08/01/2023   Cervical radiculopathy at C8 08/01/2023   Proliferative diabetic retinopathy of both eyes with macular edema associated with type 1 diabetes mellitus (HCC) 05/11/2023   Hyperosmolar hyperglycemic state (HHS) (HCC) 05/04/2023   Acute encephalopathy 05/04/2023   Protein calorie malnutrition (HCC) 02/15/2023   ICH (intracerebral hemorrhage) (HCC) 01/27/2023   DKA (diabetic ketoacidosis) (HCC) 12/16/2022   Seizure (HCC) 10/15/2021   ESRD on dialysis (HCC) 10/15/2021   Influenza vaccine needed 12/28/2020   Mallory-Weiss tear 11/16/2020   Hypertensive urgency 11/16/2020   Chronic migraine without aura without status migrainosus, not intractable 04/09/2020   Occipital neuralgia of right side 04/09/2020   Anxiety about health 10/29/2019   Cannabis abuse, episodic 10/29/2019    Abnormal uterine bleeding (AUB) 08/13/2019   Ovarian mass, left 11/27/2018   Moderate major depression (HCC) 11/27/2018   Right flank mass 05/03/2018   Secondary hyperparathyroidism (HCC) 05/01/2018   Anxiety and depression 06/23/2017   Insomnia 01/12/2016   Non compliance with medical treatment 01/05/2016   Mixed hyperlipidemia 01/05/2016   Neurogenic bladder 02/20/2015   Diabetic peripheral neuropathy associated with type 1 diabetes mellitus (HCC) 02/20/2015   Iron deficiency 02/13/2015   Asthma 09/30/2014   Diabetic retinopathy (HCC) 09/30/2014   Atypical squamous cells of undetermined significance (ASCUS) on Papanicolaou smear of cervix 08/11/2014   Diabetic gastroparesis associated with type 1 diabetes mellitus (HCC) 08/05/2014   Anemia in chronic renal disease 08/05/2014   HTN (hypertension) 07/11/2012   GERD (gastroesophageal reflux disease) 09/26/2011   Hypothyroidism 09/14/2006   Uncontrolled type 1 diabetes mellitus with hypoglycemia, with long-term current use of insulin (HCC) 01/15/2000     Current Outpatient Medications on File Prior to Visit  Medication Sig Dispense Refill   albuterol (PROVENTIL HFA) 108 (90 Base) MCG/ACT inhaler Inhale 2 puffs into the lungs every 6 (six) hours as needed for wheezing or shortness of breath. 8 g 3   amLODipine (NORVASC) 10 MG tablet Take 1 tablet (10 mg total) by mouth at bedtime. 90 tablet 1   busPIRone (BUSPAR) 15 MG tablet TAKE ONE TABLET BY MOUTH EVERYDAY AT BEDTIME 90 tablet 1   carvedilol (COREG) 6.25 MG tablet Take 1 tablet (6.25 mg total) by mouth 2 (two) times daily with a meal. 180 tablet 6   Darbepoetin Alfa (ARANESP, ALBUMIN FREE,) 100 MCG/0.5ML SOSY injection Inject 0.5 mLs (100 mcg total) into the skin every Friday at 6 PM. 4 mL 2  diltiazem (CARDIZEM) 60 MG tablet Take 1 tablet (60 mg total) by mouth every 6 (six) hours. 120 tablet 6   escitalopram (LEXAPRO) 20 MG tablet TAKE ONE TABLET BY MOUTH ONCE DAILY 90 tablet 0    gabapentin (NEURONTIN) 100 MG capsule Take 1 capsule (100 mg total) by mouth 3 (three) times daily. 270 capsule 1   hydrALAZINE (APRESOLINE) 100 MG tablet Take 1 tablet (100 mg total) by mouth 3 (three) times daily. 270 tablet 1   insulin aspart (NOVOLOG FLEXPEN) 100 UNIT/ML FlexPen Inject 2-3 Units into the skin 3 (three) times daily with meals. Per sliding scale 15 mL 6   Insulin Pen Needle (COMFORT EZ PEN NEEDLES) 32G X 4 MM MISC UAD with insulin pens 100 each 3   Insulin Syringe-Needle U-100 (BD INSULIN SYRINGE ULTRAFINE) 31G X 15/64" 1 ML MISC Used to give daily insulin injections. 100 each 11   lanthanum (FOSRENOL) 1000 MG chewable tablet Chew 1 tablet (1,000 mg total) by mouth with breakfast, with lunch, and with evening meal. 90 tablet 2   levETIRAcetam (KEPPRA) 500 MG tablet Take 1 tablet (500 mg total) by mouth 2 (two) times daily. With 250mg (1/2 tab) supplement after dialysis. 198 tablet 4   levothyroxine (SYNTHROID) 175 MCG tablet Take 175 mcg by mouth daily before breakfast.     levothyroxine (SYNTHROID) 200 MCG tablet Take 1 tablet (200 mcg total) by mouth daily before breakfast. Stop the 175 mcg dose 30 tablet 3   LOKELMA 5 g packet Take 1 packet by mouth daily.     methocarbamol (ROBAXIN-750) 750 MG tablet Take 1 tablet (750 mg total) by mouth 2 (two) times daily as needed for muscle spasms. 60 tablet 1   metoCLOPramide (REGLAN) 5 MG tablet Take 1 tablet (5 mg total) by mouth 2 (two) times daily with a meal. 180 tablet 1   multivitamin (RENA-VIT) TABS tablet Take 1 tablet by mouth at bedtime.     ondansetron (ZOFRAN-ODT) 4 MG disintegrating tablet 4mg  ODT q4 hours prn nausea/vomit (Patient taking differently: Take 4 mg by mouth every 8 (eight) hours as needed for vomiting or nausea.) 20 tablet 1   pantoprazole (PROTONIX) 40 MG tablet Take 1 tablet (40 mg total) by mouth daily. 90 tablet 1   TRESIBA FLEXTOUCH 100 UNIT/ML FlexTouch Pen Inject 5 Units into the skin 2 (two) times daily. 15  mL 6   Continuous Blood Gluc Sensor (DEXCOM G6 SENSOR) MISC APPLY 1 SENSOR EVERY 10 DAYS (Patient not taking: Reported on 09/05/2023)     Continuous Blood Gluc Transmit (DEXCOM G6 TRANSMITTER) MISC See admin instructions. (Patient not taking: Reported on 09/05/2023)     Current Facility-Administered Medications on File Prior to Visit  Medication Dose Route Frequency Provider Last Rate Last Admin   adenosine (diagnostic) (ADENOSCAN) infusion 32.4 mg  0.56 mg/kg Intravenous Once Jake Bathe, MD        Allergies  Allergen Reactions   Bactrim [Sulfamethoxazole-Trimethoprim] Hives and Itching   Feraheme [Ferumoxytol] Itching   Iron Other (See Comments)    intolerance   Zestril [Lisinopril] Other (See Comments)    Hyperkalemia     Social History   Socioeconomic History   Marital status: Divorced    Spouse name: Not on file   Number of children: 0   Years of education: 2 years of college    Highest education level: Not on file  Occupational History   Occupation: unemployed    Comment: worked at a group   Occupation:  disability  Tobacco Use   Smoking status: Former    Current packs/day: 0.00    Average packs/day: 0.3 packs/day for 2.0 years (0.5 ttl pk-yrs)    Types: Cigarettes    Start date: 03/05/2011    Quit date: 03/04/2013    Years since quitting: 10.5   Smokeless tobacco: Never  Vaping Use   Vaping status: Never Used  Substance and Sexual Activity   Alcohol use: No    Alcohol/week: 0.0 standard drinks of alcohol   Drug use: Not Currently    Frequency: 4.0 times per week    Types: Marijuana   Sexual activity: Yes    Partners: Female    Birth control/protection: None    Comment: women preference   Other Topics Concern   Not on file  Social History Narrative   Occupation: currently unemployed   Single   Homosexual,     Used to be a gang member, got arrested for robbing a gas station (March - June 2012), is cleared now and lives away from her previous friends.           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        Update 04/09/2020   Has own apartment   Caffeine: maybe 2 cans of soda/day    Right handed      Update 03/21/2023   Lives with mother   Caffeine: once in awhile    Social Determinants of Health   Financial Resource Strain: Medium Risk (01/20/2022)   Overall Financial Resource Strain (CARDIA)    Difficulty of Paying Living Expenses: Somewhat hard  Food Insecurity: No Food Insecurity (08/08/2023)   Hunger Vital Sign    Worried About Running Out of Food in the Last Year: Never true    Ran Out of Food in the Last Year: Never true  Transportation Needs: No Transportation Needs (08/08/2023)   PRAPARE - Administrator, Civil Service (Medical): No    Lack of Transportation (Non-Medical): No  Physical Activity: Inactive (10/29/2019)   Exercise Vital Sign    Days of Exercise per Week: 0 days    Minutes of Exercise per Session: 0 min  Stress: Not on file  Social Connections: Not on file  Intimate Partner Violence: Not At Risk (08/08/2023)   Humiliation, Afraid, Rape, and Kick questionnaire    Fear of Current or Ex-Partner: No    Emotionally Abused: No    Physically Abused: No    Sexually Abused: No    Family History  Problem Relation Age of Onset   Multiple sclerosis Mother    Hypothyroidism Mother    Stroke Mother        at age 24 yo   Migraines Mother    Hyperlipidemia Maternal Grandmother    Hypertension Maternal Grandmother    Heart disease Maternal Grandmother        unknown type   Diabetes Maternal Grandmother    Hypertension Maternal Grandfather    Prostate cancer Maternal Grandfather    Diabetes type I Maternal Grandfather    Breast cancer Paternal Grandmother    Cancer Neg Hx     Past Surgical History:  Procedure Laterality Date   FOOT FUSION Right  2006   "put screws in it too" (09/19/2013)    ROS: Review of Systems Negative except as  stated above  PHYSICAL EXAM: BP 129/80 (BP Location: Left Arm, Patient Position: Sitting, Cuff Size: Normal)   Pulse 71   Ht 5\' 4"  (1.626 m)   Wt 132 lb (59.9 kg)   SpO2 98%   BMI 22.66 kg/m   Physical Exam  {female adult master:310786} {female adult master:310785}    09/05/2023    3:03 PM 07/04/2023    2:45 PM 05/11/2023   10:55 AM  Depression screen PHQ 2/9  Decreased Interest 1 1 2   Down, Depressed, Hopeless 1 1 3   PHQ - 2 Score 2 2 5   Altered sleeping 2 1 2   Tired, decreased energy 1 1 2   Change in appetite 0 0 0  Feeling bad or failure about yourself  1 1 1   Trouble concentrating 1 1 1   Moving slowly or fidgety/restless 1 1 1   Suicidal thoughts 0 1 1  PHQ-9 Score 8 8 13   Difficult doing work/chores Not difficult at all         Latest Ref Rng & Units 08/10/2023    8:16 AM 08/09/2023    4:02 AM 08/08/2023    3:52 AM  CMP  Glucose 70 - 99 mg/dL 865  784  696   BUN 6 - 20 mg/dL 63  42  65   Creatinine 0.44 - 1.00 mg/dL 2.95  2.84  1.32   Sodium 135 - 145 mmol/L 128  133  130   Potassium 3.5 - 5.1 mmol/L 5.1  3.9  5.9   Chloride 98 - 111 mmol/L 87  90  88   CO2 22 - 32 mmol/L 22  25  23    Calcium 8.9 - 10.3 mg/dL 9.3  9.2  8.8   Total Protein 6.5 - 8.1 g/dL 6.2  6.6  6.8   Total Bilirubin 0.3 - 1.2 mg/dL 0.8  0.9  0.7   Alkaline Phos 38 - 126 U/L 236  291  281   AST 15 - 41 U/L 14  16  24    ALT 0 - 44 U/L 20  28  37    Lipid Panel     Component Value Date/Time   CHOL 247 (H) 01/28/2023 0300   CHOL 239 (H) 12/28/2020 1432   TRIG 88 01/31/2023 0548   HDL 119 01/28/2023 0300   HDL 72 12/28/2020 1432   CHOLHDL 2.1 01/28/2023 0300   VLDL 13 01/28/2023 0300   LDLCALC 115 (H) 01/28/2023 0300   LDLCALC 151 (H) 12/28/2020 1432    CBC    Component Value Date/Time   WBC 5.7 08/10/2023 0816   RBC 3.84 (L) 08/10/2023 0816   HGB 10.0 (L) 08/10/2023 0816   HGB 9.4 (L)  03/30/2023 1439   HCT 31.6 (L) 08/10/2023 0816   HCT 30.0 (L) 03/30/2023 1439   PLT 147 (L) 08/10/2023 0816   PLT 174 03/30/2023 1439   MCV 82.3 08/10/2023 0816   MCV 89 03/30/2023 1439   MCH 26.0 08/10/2023 0816   MCHC 31.6 08/10/2023 0816   RDW 18.1 (H) 08/10/2023 0816   RDW 14.7 03/30/2023 1439   LYMPHSABS 0.9 05/04/2023 0030   LYMPHSABS 1.3 03/21/2023 1214   MONOABS 0.8 05/04/2023 0030   EOSABS 0.0 05/04/2023 0030   EOSABS 0.8 (H) 03/21/2023 1214   BASOSABS 0.0 05/04/2023 0030   BASOSABS 0.1 03/21/2023 1214    ASSESSMENT AND PLAN:  1. 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, with long-term current use of insulin (HCC) *** - POCT glycosylated hemoglobin (Hb  A1C)  2. Encounter for immunization *** - Flu vaccine trivalent PF, 6mos and older(Flulaval,Afluria,Fluarix,Fluzone)    Patient was given the opportunity to ask questions.  Patient verbalized understanding of the plan and was able to repeat key elements of the plan.   This documentation was completed using Paediatric nurse.  Any transcriptional errors are unintentional.  Orders Placed This Encounter  Procedures   Flu vaccine trivalent PF, 6mos and older(Flulaval,Afluria,Fluarix,Fluzone)   POCT glycosylated hemoglobin (Hb A1C)     Requested Prescriptions    No prescriptions requested or ordered in this encounter    No follow-ups on file.  Jonah Blue, MD, FACP

## 2023-09-07 ENCOUNTER — Other Ambulatory Visit: Payer: MEDICAID

## 2023-09-16 ENCOUNTER — Encounter: Payer: Self-pay | Admitting: Internal Medicine

## 2023-09-17 ENCOUNTER — Other Ambulatory Visit: Payer: Self-pay | Admitting: Internal Medicine

## 2023-09-17 DIAGNOSIS — R4589 Other symptoms and signs involving emotional state: Secondary | ICD-10-CM

## 2023-09-17 DIAGNOSIS — F331 Major depressive disorder, recurrent, moderate: Secondary | ICD-10-CM

## 2023-09-17 DIAGNOSIS — I120 Hypertensive chronic kidney disease with stage 5 chronic kidney disease or end stage renal disease: Secondary | ICD-10-CM

## 2023-09-17 MED ORDER — COMFORT EZ PEN NEEDLES 32G X 4 MM MISC: 3 refills | Status: DC

## 2023-09-17 MED ORDER — ESCITALOPRAM OXALATE 20 MG PO TABS
20.0000 mg | ORAL_TABLET | Freq: Every day | ORAL | 1 refills | Status: DC
Start: 2023-09-17 — End: 2024-11-10

## 2023-09-17 MED ORDER — AMLODIPINE BESYLATE 10 MG PO TABS: 10.0000 mg | ORAL_TABLET | Freq: Every day | ORAL | 2 refills | Status: DC

## 2023-09-17 MED ORDER — CARVEDILOL 6.25 MG PO TABS: 6.2500 mg | ORAL_TABLET | Freq: Two times a day (BID) | ORAL | 6 refills | Status: DC

## 2023-09-17 MED ORDER — ARANESP (ALBUMIN FREE) 100 MCG/0.5ML IJ SOSY: PREFILLED_SYRINGE | INTRAMUSCULAR | 2 refills | Status: DC

## 2023-09-17 MED ORDER — BUSPIRONE HCL 15 MG PO TABS
15.0000 mg | ORAL_TABLET | Freq: Every day | ORAL | 2 refills | Status: DC
Start: 2023-09-17 — End: 2024-08-20

## 2023-09-17 MED ORDER — LEVOTHYROXINE SODIUM 200 MCG PO TABS: 200.0000 ug | ORAL_TABLET | Freq: Every day | ORAL | 3 refills | Status: DC

## 2023-09-17 MED ORDER — TRESIBA FLEXTOUCH 100 UNIT/ML ~~LOC~~ SOPN: 5.0000 [IU] | PEN_INJECTOR | Freq: Two times a day (BID) | SUBCUTANEOUS | 6 refills | Status: DC

## 2023-09-17 MED ORDER — NOVOLOG FLEXPEN 100 UNIT/ML ~~LOC~~ SOPN: 2.0000 [IU] | PEN_INJECTOR | Freq: Three times a day (TID) | SUBCUTANEOUS | 6 refills | Status: AC

## 2023-09-17 MED ORDER — GABAPENTIN 100 MG PO CAPS: 100.0000 mg | ORAL_CAPSULE | Freq: Three times a day (TID) | ORAL | 1 refills | Status: DC

## 2023-09-17 MED ORDER — LEVETIRACETAM 500 MG PO TABS: 500.0000 mg | ORAL_TABLET | Freq: Two times a day (BID) | ORAL | 4 refills | Status: DC

## 2023-09-17 MED ORDER — DILTIAZEM HCL 60 MG PO TABS: 60.0000 mg | ORAL_TABLET | Freq: Four times a day (QID) | ORAL | 6 refills | Status: DC

## 2023-09-17 MED ORDER — HYDRALAZINE HCL 100 MG PO TABS: 100.0000 mg | ORAL_TABLET | Freq: Three times a day (TID) | ORAL | 1 refills | Status: DC

## 2023-09-17 MED ORDER — LANTHANUM CARBONATE 1000 MG PO CHEW
1000.0000 mg | CHEWABLE_TABLET | Freq: Three times a day (TID) | ORAL | 2 refills | Status: DC
Start: 2023-09-17 — End: 2023-10-10

## 2023-09-19 ENCOUNTER — Ambulatory Visit (INDEPENDENT_AMBULATORY_CARE_PROVIDER_SITE_OTHER): Payer: MEDICAID | Admitting: Neurology

## 2023-09-19 ENCOUNTER — Ambulatory Visit: Payer: Self-pay | Admitting: Neurology

## 2023-09-19 DIAGNOSIS — G5621 Lesion of ulnar nerve, right upper limb: Secondary | ICD-10-CM

## 2023-09-19 DIAGNOSIS — G5622 Lesion of ulnar nerve, left upper limb: Secondary | ICD-10-CM

## 2023-09-19 DIAGNOSIS — Z0289 Encounter for other administrative examinations: Secondary | ICD-10-CM

## 2023-09-19 DIAGNOSIS — R200 Anesthesia of skin: Secondary | ICD-10-CM

## 2023-09-19 MED ORDER — ONDANSETRON 8 MG PO TBDP: 8.0000 mg | ORAL_TABLET | Freq: Three times a day (TID) | ORAL | 6 refills | Status: DC | PRN

## 2023-09-19 NOTE — Patient Instructions (Addendum)
Ulnar neuropathy  Cubital tunnel syndrome is a condition that causes pain, numbness, tingling, and weakness of the forearm and hand. It happens when your ulnar nerve is irritated or pinched (compressed). The ulnar nerve runs from your shoulder to the small finger (pinkie) of your hand. In most cases, cubital tunnel syndrome is caused by arm motions that are done over and over during sports or while at work. What are the causes? This condition may be caused by: Pressure on the ulnar nerve. This may be from: Repeated elbow bending. Poorly healed broken bones (fractures). Tumors in the elbow. In most cases, these are not cancer. Scar tissue that forms in the elbow after an injury. Bony growths (spurs) near the ulnar nerve. Stretching of the nerve. This may happen when the tissues that connect bones to each other (ligaments) become loose. Trauma to the nerve at the elbow. What increases the risk? You may be more likely to get this condition if: You do manual labor and have to bend your elbow a lot. You play sports that include a lot of throwing motions, such as baseball. You play contact sports, such as football or lacrosse. You do not warm up enough before you do activities. You have diabetes. You have hypothyroidism. This is when your thyroid does not make enough hormones. What are the signs or symptoms? Symptoms of this condition include: Clumsiness or weakness of the hand. You also may not be able to grip or pinch firmly. Aching, soreness, or tenderness of the inner elbow, forearm, or fingers. You may feel this most in your pinkie or ring finger. More pain when you force your elbow to bend or shooting pain from your elbow to your hand. Reduced control when you throw objects. Tingling, numbness, or a burning feeling in your forearm, hand, or fingers. You may feel this most in your pinkie or ring finger. How is this diagnosed? This condition is diagnosed based on  your symptoms, your medical history, and a physical exam. Your health care provider will ask about any injuries. You may also have tests, such as: An electromyogram (EMG). This is done to see how well your nerves are working. A nerve conduction study (NCS). This is done to see how electrical signals pass through your nerves. Imaging tests, such as X-rays, ultrasound, and MRI. These are done to find the source of your nerve problems. How is this treated? This condition may be treated by: Stopping the activities that make your symptoms worse. Icing your elbow. Taking medicines to reduce pain and swelling. Wearing a removable brace. This stops your elbow from bending. Wearing an elbow pad where the ulnar nerve is closest to the skin. Working with a physical therapist. This may help your symptoms get better. It can also help you improve the strength and range of motion of your elbow, forearm, and hand. Steroids. These are medicines that are injected into your body to reduce inflammation. If these treatments do not help, you may need surgery. Follow these instructions at home: Medicines Take over-the-counter and prescription medicines only as told by your provider. Ask your provider if the medicine prescribed to you requires you to avoid driving or using machinery. If you have a removable brace: Wear the brace as told by your provider. Remove it only as told by your provider. Check the skin around the brace every day. Tell your provider about any concerns. Loosen the brace if your fingers tingle,  become numb, or turn cold and blue. Keep the brace clean. If the brace is not waterproof: Do not let it get wet. Cover it with a watertight covering when you take a bath or shower. Managing pain, stiffness, and swelling  If told, put ice on the injured area. If you have a removable brace, remove it as told by your provider. Put ice in a plastic bag. Place a towel between your skin and the  bag. Leave the ice on for 20 minutes, 2-3 times a day. If your skin turns bright red, remove the ice right away to prevent skin damage. The risk of damage is higher if you cannot feel pain, heat, or cold. Move your fingers often to reduce stiffness and swelling. Raise (elevate) the injured area above the level of your heart while you are sitting or lying down. General instructions Do exercises or physical therapy as told by your provider. If you were given an elbow pad or brace, wear it as told by your provider. Keep all follow-up visits. Your provider will check if your symptoms are getting better. They can also adjust the treatment if needed. Contact a health care provider if: Your symptoms get worse. Your symptoms do not get better with treatment. You have new pain. Your hand on the injured side feels numb or cold. This information is not intended to replace advice given to you by your health care provider. Make sure you discuss any questions you have with your health care provider. Document Revised: 09/23/2022 Document Reviewed: 09/23/2022 Elsevier Patient Education  2024 [employer].

## 2023-09-19 NOTE — Progress Notes (Signed)
Full Name: Angela [name] Gender: Female MRN #: [id number] Date of Birth: [birth year]    Visit Date: 09/19/2023 09:36 Age: 37 Years Examining Physician: Dr. Naomie Dean Referring Physician: Dr. Naomie Dean Height: 5 feet 4 inch    History: Pain the elbows radiating to digits 4/5.  Also  is having a lot of neck pain, shooting down the arm, digits 4-5, + tinel's sign at right elbow. Wakes up middle of the night with numbness in the right hand. Neck pain radiating into the right arm digits 4-5. MRI cervical spine 08/31/2023: normal. 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 Right is worse per patient. Fistula is on the left. Start with right arm then and avoid the left proximally.   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 Right is worse per patient. Fistula is on the left.and had to avoid any testing on the left proximally.   NCS was performed on the bilateral upper extremities: The right ulnar ADM motor nerve showed prolonged onset latency (5.1 ms, normal less than 3.3) and reduced amplitude (0.1 mV, normal greater than 6).  The left ulnar ADM motor nerve showed no response.  The right ulnar FDI motor nerve showed prolonged onset latency (5.1 ms, normal less than 4.5) and reduced amplitude (1.4 mV, normal greater than 7) and conduction velocity drop of 11 m/s across the elbow (normal less than 10).  The left ulnar FDI motor nerve showed  prolonged onset latency (9 ms, normal less than 4.5) and reduced amplitude (0.3 mV, normal greater than 7). The right radial sensory nerve showed reduced amplitude (10 V, normal greater than 15).  The right median orthodromic sensory nerve showed reduced amplitude (4 V, normal greater than 10).  The left median orthodromic sensory nerve showed prolonged peak latency (3.7 ms, normal less than 3.4) and reduced amplitude (9 V, normal greater than 10).  The right ulnar orthodromic sensory nerve showed no response, the left ulnar orthodromic sensory nerve showed  no response.All remaining nerves (as indicated in the following tables) were within normal limits.    EMG was performed on the right upper extremity, Patient declined EMG needle study of the left arm:The right flexor digitorum profundus muscle showed polyphasic motor units and diminished motor unit recruitment.  The right ADM showed prolonged motor unit duration and diminished motor unit recruitment.  All remaining muscles (as indicated in the following tables) were within normal limits.     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  ------------------------------- Naomie Dean, M.D.  [facility] [address] [place], [place] [address] Tel: [phone] Fax: [phone]  Verbal informed consent was obtained from the patient, patient was informed of potential risk of procedure, including bruising, bleeding, hematoma formation, infection, muscle weakness, muscle pain, numbness, among others.      MNC    Nerve / Sites Muscle Latency Ref. Amplitude Ref. Rel Amp Segments Distance Velocity Ref. Area    ms ms mV mV %  cm m/s m/s mVms  R Median - APB     Wrist APB 4.2 <=4.4 5.9 >=4.0 100 Wrist - APB 7   19.7     Upper arm APB 8.8  5.3  89.2 Upper arm - Wrist 23.6 51 >=49 18.9  L Median - APB     Wrist APB  4.3 <=4.4 5.1 >=4.0 100 Wrist - APB 7   19.3  R Ulnar - ADM     Wrist ADM 5.1 <=3.3 0.1 >=6.0 100 Wrist - ADM 7   0.3     B.Elbow ADM 8.4  0.2  125 B.Elbow - Wrist   >=49 0.2  L Ulnar - ADM     Wrist ADM NR <=3.3 NR >=6.0 NR Wrist - ADM 7   NR     B.Elbow ADM 11.8  0.2   B.Elbow - Wrist 11 NR >=49 0.3  R Ulnar - FDI     Wrist FDI 5.1 <=4.5 1.4 >=7.0 100 Wrist - FDI 8   3.3     B.Elbow FDI 8.0  1.4  97.6 B.Elbow - Wrist 14 48 >=49 2.7     A.Elbow FDI 12.1  1.3   91.5 A.Elbow - B.Elbow 15 37 >=49 2.9         A.Elbow - Wrist      L Ulnar - FDI     Wrist FDI 9.0 <=4.5 0.3 >=7.0 100 Wrist - FDI 8   1.1     B.Elbow FDI 14.4  0.6  235 B.Elbow - Wrist   >=49          A.Elbow - Wrist                     SNC  Nerve / Sites Rec. Site Peak Lat Ref.  Amp Ref. Segments Distance    ms ms V V  cm  R Radial - Anatomical snuff box (Forearm)     Forearm Wrist 2.6 <=2.9 10 >=15 Forearm - Wrist 10  L Radial - Anatomical snuff box (Forearm)     Forearm Wrist 2.7 <=2.9 17 >=15 Forearm - Wrist 10  R Median - Orthodromic (Dig II, Mid palm)     Dig II Wrist 3.4 <=3.4 4 >=10 Dig II - Wrist 13  L Median - Orthodromic (Dig II, Mid palm)     Dig II Wrist 3.7 <=3.4 9 >=10 Dig II - Wrist 13  R Ulnar - Orthodromic, (Dig V, Mid palm)     Dig V Wrist NR <=3.1 NR >=5 Dig V - Wrist 11  L Ulnar - Orthodromic, (Dig V, Mid palm)     Dig V Wrist NR <=3.1 NR >=5 Dig V - Wrist 11                 EMG Summary Table    Spontaneous MUAP Recruitment  Muscle IA Fib PSW Fasc Other Amp Dur. Poly Pattern  R. Deltoid Normal None None None _______ Normal Normal Normal Normal  R. Triceps brachii Normal None None None _______ Normal Normal Normal Normal  R. Flexor digitorum profundus (Ulnar) Normal None None None _______ Normal Normal 2+ Reduced  R. Pronator teres Normal None None None _______ Normal Normal Normal Normal  R. Abductor digiti minimi (manus) Normal None None None _______ Normal Increased Normal Reduced  R. First dorsal interosseous Normal None None None _______ Normal Normal Normal Reduced  R. Opponens pollicis Normal None None None _______ Normal Normal Normal Normal
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
## 2023-09-21 ENCOUNTER — Ambulatory Visit: Payer: MEDICAID | Attending: Physician Assistant | Admitting: Occupational Therapy

## 2023-09-22 NOTE — Procedures (Signed)
Full Name: Angela [name] Gender: Female MRN #: [id number] Date of Birth: [birth year]    Visit Date: 09/19/2023 09:36 Age: 37 Years Examining Physician: Dr. Naomie Dean Referring Physician: Dr. Naomie Dean Height: 5 feet 4 inch    History: Pain the elbows radiating to digits 4/5.  Also  is having a lot of neck pain, shooting down the arm, digits 4-5, + tinel's sign at right elbow. Wakes up middle of the night with numbness in the right hand. Neck pain radiating into the right arm digits 4-5. MRI cervical spine 08/31/2023: normal. 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 Right is worse per patient. Fistula is on the left. Start with right arm then and avoid the left proximally.   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 Right is worse per patient. Fistula is on the left.and had to avoid any testing on the left proximally.   NCS was performed on the bilateral upper extremities: The right ulnar ADM motor nerve showed prolonged onset latency (5.1 ms, normal less than 3.3) and reduced amplitude (0.1 mV, normal greater than 6).  The left ulnar ADM motor nerve showed no response.  The right ulnar FDI motor nerve showed prolonged onset latency (5.1 ms, normal less than 4.5) and reduced amplitude (1.4 mV, normal greater than 7) and conduction velocity drop of 11 m/s across the elbow (normal less than 10).  The left ulnar FDI motor nerve showed  prolonged onset latency (9 ms, normal less than 4.5) and reduced amplitude (0.3 mV, normal greater than 7). The right radial sensory nerve showed reduced amplitude (10 V, normal greater than 15).  The right median orthodromic sensory nerve showed reduced amplitude (4 V, normal greater than 10).  The left median orthodromic sensory nerve showed prolonged peak latency (3.7 ms, normal less than 3.4) and reduced amplitude (9 V, normal greater than 10).  The right ulnar orthodromic sensory nerve showed no response, the left ulnar orthodromic sensory nerve showed  no response.All remaining nerves (as indicated in the following tables) were within normal limits.    EMG was performed on the right upper extremity, Patient declined EMG needle study of the left arm:The right flexor digitorum profundus muscle showed polyphasic motor units and diminished motor unit recruitment.  The right ADM showed prolonged motor unit duration and diminished motor unit recruitment.  All remaining muscles (as indicated in the following tables) were within normal limits.     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  ------------------------------- Naomie Dean, M.D.  [facility] [address] [place], [place] [address] Tel: [phone] Fax: [phone]  Verbal informed consent was obtained from the patient, patient was informed of potential risk of procedure, including bruising, bleeding, hematoma formation, infection, muscle weakness, muscle pain, numbness, among others.      MNC    Nerve / Sites Muscle Latency Ref. Amplitude Ref. Rel Amp Segments Distance Velocity Ref. Area    ms ms mV mV %  cm m/s m/s mVms  R Median - APB     Wrist APB 4.2 <=4.4 5.9 >=4.0 100 Wrist - APB 7   19.7     Upper arm APB 8.8  5.3  89.2 Upper arm - Wrist 23.6 51 >=49 18.9  L Median - APB     Wrist APB  4.3 <=4.4 5.1 >=4.0 100 Wrist - APB 7   19.3  R Ulnar - ADM     Wrist ADM 5.1 <=3.3 0.1 >=6.0 100 Wrist - ADM 7   0.3     B.Elbow ADM 8.4  0.2  125 B.Elbow - Wrist   >=49 0.2  L Ulnar - ADM     Wrist ADM NR <=3.3 NR >=6.0 NR Wrist - ADM 7   NR     B.Elbow ADM 11.8  0.2   B.Elbow - Wrist 11 NR >=49 0.3  R Ulnar - FDI     Wrist FDI 5.1 <=4.5 1.4 >=7.0 100 Wrist - FDI 8   3.3     B.Elbow FDI 8.0  1.4  97.6 B.Elbow - Wrist 14 48 >=49 2.7     A.Elbow FDI 12.1  1.3   91.5 A.Elbow - B.Elbow 15 37 >=49 2.9         A.Elbow - Wrist      L Ulnar - FDI     Wrist FDI 9.0 <=4.5 0.3 >=7.0 100 Wrist - FDI 8   1.1     B.Elbow FDI 14.4  0.6  235 B.Elbow - Wrist   >=49          A.Elbow - Wrist                     SNC  Nerve / Sites Rec. Site Peak Lat Ref.  Amp Ref. Segments Distance    ms ms V V  cm  R Radial - Anatomical snuff box (Forearm)     Forearm Wrist 2.6 <=2.9 10 >=15 Forearm - Wrist 10  L Radial - Anatomical snuff box (Forearm)     Forearm Wrist 2.7 <=2.9 17 >=15 Forearm - Wrist 10  R Median - Orthodromic (Dig II, Mid palm)     Dig II Wrist 3.4 <=3.4 4 >=10 Dig II - Wrist 13  L Median - Orthodromic (Dig II, Mid palm)     Dig II Wrist 3.7 <=3.4 9 >=10 Dig II - Wrist 13  R Ulnar - Orthodromic, (Dig V, Mid palm)     Dig V Wrist NR <=3.1 NR >=5 Dig V - Wrist 11  L Ulnar - Orthodromic, (Dig V, Mid palm)     Dig V Wrist NR <=3.1 NR >=5 Dig V - Wrist 11                 EMG Summary Table    Spontaneous MUAP Recruitment  Muscle IA Fib PSW Fasc Other Amp Dur. Poly Pattern  R. Deltoid Normal None None None _______ Normal Normal Normal Normal  R. Triceps brachii Normal None None None _______ Normal Normal Normal Normal  R. Flexor digitorum profundus (Ulnar) Normal None None None _______ Normal Normal 2+ Reduced  R. Pronator teres Normal None None None _______ Normal Normal Normal Normal  R. Abductor digiti minimi (manus) Normal None None None _______ Normal Increased Normal Reduced  R. First dorsal interosseous Normal None None None _______ Normal Normal Normal Reduced  R. Opponens pollicis Normal None None None _______ Normal Normal Normal Normal

## 2023-09-22 NOTE — Progress Notes (Signed)
Reviewed findings with patient, discussed pathophysiology or Ulnar neuropathy at the elbow, results of emg/ncs, treatment including surgical, conservative.   History: Pain the elbows radiating to digits 4/5.  Also  is having a lot of neck pain, shooting down the arm, digits 4-5, + tinel's sign at right elbow. Wakes up middle of the night with numbness in the right hand. Neck pain radiating into the right arm digits 4-5. MRI cervical spine 08/31/2023: normal. 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 Right is worse per patient. Fistula is on the left. Start with right arm then and avoid the left proximally.   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 Right is worse per patient. Fistula is on the left.and had to avoid any testing on the left proximally.   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   Physical therapy or occupational therapy for Ulnar Neuropathy Send to orthopaedics for evaluation of treatment Zofran for nausea F/u in the future as needed  Orders Placed This Encounter  Procedures   Ambulatory referral to Occupational Therapy   Ambulatory referral to Physical Therapy   Ambulatory referral to Orthopedic Surgery   Meds ordered this encounter  Medications   ondansetron (ZOFRAN-ODT) 8 MG disintegrating tablet    Sig: Take 1 tablet (8 mg total) by mouth every 8 (eight) hours as needed for nausea or vomiting.    Dispense:  20 tablet    Refill:  6    I spent 10 minutes of face-to-face and non-face-to-face time with patient on the  1. Ulnar neuropathy at elbow of left upper extremity   2. Ulnar neuropathy of right upper extremity    diagnosis.  This included previsit chart review, lab review, study review, order entry, electronic health record documentation, patient education on the different diagnostic and therapeutic options, counseling and coordination of care, risks and benefits of management, compliance, or risk  factor reduction. This does not include time spoent pon emg/ncs procedure.

## 2023-09-25 ENCOUNTER — Telehealth: Payer: Self-pay | Admitting: Neurology

## 2023-09-25 NOTE — Telephone Encounter (Signed)
Referral  for orthopedic surgery fax to [facility]. Phone: [phone], Fax: [phone]

## 2023-09-26 ENCOUNTER — Ambulatory Visit: Payer: MEDICAID | Admitting: Physical Therapy

## 2023-09-26 ENCOUNTER — Telehealth: Payer: Self-pay | Admitting: Physical Therapy

## 2023-09-26 DIAGNOSIS — M6281 Muscle weakness (generalized): Secondary | ICD-10-CM | POA: Insufficient documentation

## 2023-09-26 NOTE — Therapy (Signed)
Patient Name: Kaidynce Pfister MRN: [id number] DOB:1986/09/23, 37 y.o., female Today's Date: 09/26/2023   PT End of Session - 09/26/23 1331     Visit Number 1    Authorization Type [employer]    PT Start Time 1322   Pt arrived late   PT Stop Time 1328    PT Time Calculation (min) 6 min    Activity Tolerance Patient tolerated treatment well    Behavior During Therapy [facility] for tasks assessed/performed            Pt arrived for scheduled PT eval. Informed pt that her referral is more appropriate for OT and she is scheduled for OT this week already. Pt verbalized understanding and reports she could not stay for eval today anyway as she has to go pick up her family member. Reminded pt of time and date of OT eval.    Aryona Sill E Jessenya Berdan, PT 09/26/2023, 1:31 PM

## 2023-09-28 ENCOUNTER — Ambulatory Visit: Payer: MEDICAID | Attending: Neurology | Admitting: Occupational Therapy

## 2023-10-09 ENCOUNTER — Inpatient Hospital Stay (HOSPITAL_COMMUNITY)
Admission: EM | Admit: 2023-10-09 | Discharge: 2023-10-12 | DRG: 640 | Disposition: A | Discharge status: Home / Self Care | Payer: MEDICAID | Attending: Internal Medicine | Admitting: Internal Medicine

## 2023-10-09 ENCOUNTER — Emergency Department (HOSPITAL_COMMUNITY): Payer: MEDICAID

## 2023-10-09 ENCOUNTER — Other Ambulatory Visit: Payer: Self-pay

## 2023-10-09 ENCOUNTER — Encounter (HOSPITAL_COMMUNITY): Payer: Self-pay

## 2023-10-09 DIAGNOSIS — I10 Essential (primary) hypertension: Secondary | ICD-10-CM | POA: Diagnosis present

## 2023-10-09 DIAGNOSIS — Z91158 Patient's noncompliance with renal dialysis for other reason: Secondary | ICD-10-CM

## 2023-10-09 DIAGNOSIS — I120 Hypertensive chronic kidney disease with stage 5 chronic kidney disease or end stage renal disease: Secondary | ICD-10-CM | POA: Diagnosis present

## 2023-10-09 DIAGNOSIS — Z8249 Family history of ischemic heart disease and other diseases of the circulatory system: Secondary | ICD-10-CM

## 2023-10-09 DIAGNOSIS — D631 Anemia in chronic kidney disease: Secondary | ICD-10-CM | POA: Diagnosis present

## 2023-10-09 DIAGNOSIS — Z79899 Other long term (current) drug therapy: Secondary | ICD-10-CM

## 2023-10-09 DIAGNOSIS — J811 Chronic pulmonary edema: Secondary | ICD-10-CM | POA: Diagnosis present

## 2023-10-09 DIAGNOSIS — Z560 Unemployment, unspecified: Secondary | ICD-10-CM

## 2023-10-09 DIAGNOSIS — E1022 Type 1 diabetes mellitus with diabetic chronic kidney disease: Secondary | ICD-10-CM | POA: Diagnosis present

## 2023-10-09 DIAGNOSIS — Z794 Long term (current) use of insulin: Secondary | ICD-10-CM

## 2023-10-09 DIAGNOSIS — E1042 Type 1 diabetes mellitus with diabetic polyneuropathy: Secondary | ICD-10-CM

## 2023-10-09 DIAGNOSIS — Z8673 Personal history of transient ischemic attack (TIA), and cerebral infarction without residual deficits: Secondary | ICD-10-CM

## 2023-10-09 DIAGNOSIS — F329 Major depressive disorder, single episode, unspecified: Secondary | ICD-10-CM | POA: Diagnosis present

## 2023-10-09 DIAGNOSIS — R188 Other ascites: Secondary | ICD-10-CM | POA: Diagnosis present

## 2023-10-09 DIAGNOSIS — Z833 Family history of diabetes mellitus: Secondary | ICD-10-CM

## 2023-10-09 DIAGNOSIS — Z992 Dependence on renal dialysis: Secondary | ICD-10-CM

## 2023-10-09 DIAGNOSIS — E8770 Fluid overload, unspecified: Principal | ICD-10-CM | POA: Diagnosis present

## 2023-10-09 DIAGNOSIS — Z820 Family history of epilepsy and other diseases of the nervous system: Secondary | ICD-10-CM

## 2023-10-09 DIAGNOSIS — Z823 Family history of stroke: Secondary | ICD-10-CM

## 2023-10-09 DIAGNOSIS — K219 Gastro-esophageal reflux disease without esophagitis: Secondary | ICD-10-CM | POA: Diagnosis present

## 2023-10-09 DIAGNOSIS — Z803 Family history of malignant neoplasm of breast: Secondary | ICD-10-CM

## 2023-10-09 DIAGNOSIS — E1040 Type 1 diabetes mellitus with diabetic neuropathy, unspecified: Secondary | ICD-10-CM | POA: Diagnosis present

## 2023-10-09 DIAGNOSIS — Z91199 Patient's noncompliance with other medical treatment and regimen due to unspecified reason: Secondary | ICD-10-CM

## 2023-10-09 DIAGNOSIS — R0902 Hypoxemia: Secondary | ICD-10-CM

## 2023-10-09 DIAGNOSIS — J9601 Acute respiratory failure with hypoxia: Secondary | ICD-10-CM | POA: Diagnosis present

## 2023-10-09 DIAGNOSIS — G40909 Epilepsy, unspecified, not intractable, without status epilepticus: Secondary | ICD-10-CM

## 2023-10-09 DIAGNOSIS — Z87891 Personal history of nicotine dependence: Secondary | ICD-10-CM

## 2023-10-09 DIAGNOSIS — E039 Hypothyroidism, unspecified: Secondary | ICD-10-CM | POA: Diagnosis present

## 2023-10-09 DIAGNOSIS — R569 Unspecified convulsions: Secondary | ICD-10-CM

## 2023-10-09 DIAGNOSIS — E1159 Type 2 diabetes mellitus with other circulatory complications: Secondary | ICD-10-CM | POA: Diagnosis present

## 2023-10-09 DIAGNOSIS — Z1152 Encounter for screening for COVID-19: Secondary | ICD-10-CM

## 2023-10-09 DIAGNOSIS — Z8679 Personal history of other diseases of the circulatory system: Secondary | ICD-10-CM

## 2023-10-09 DIAGNOSIS — N2581 Secondary hyperparathyroidism of renal origin: Secondary | ICD-10-CM | POA: Diagnosis present

## 2023-10-09 DIAGNOSIS — Z83438 Family history of other disorder of lipoprotein metabolism and other lipidemia: Secondary | ICD-10-CM

## 2023-10-09 DIAGNOSIS — E875 Hyperkalemia: Principal | ICD-10-CM | POA: Diagnosis present

## 2023-10-09 DIAGNOSIS — Z882 Allergy status to sulfonamides status: Secondary | ICD-10-CM

## 2023-10-09 DIAGNOSIS — E782 Mixed hyperlipidemia: Secondary | ICD-10-CM | POA: Diagnosis present

## 2023-10-09 DIAGNOSIS — J45909 Unspecified asthma, uncomplicated: Secondary | ICD-10-CM | POA: Diagnosis present

## 2023-10-09 DIAGNOSIS — Z888 Allergy status to other drugs, medicaments and biological substances status: Secondary | ICD-10-CM

## 2023-10-09 DIAGNOSIS — G43909 Migraine, unspecified, not intractable, without status migrainosus: Secondary | ICD-10-CM | POA: Diagnosis present

## 2023-10-09 DIAGNOSIS — Z5986 Financial insecurity: Secondary | ICD-10-CM

## 2023-10-09 DIAGNOSIS — E103593 Type 1 diabetes mellitus with proliferative diabetic retinopathy without macular edema, bilateral: Secondary | ICD-10-CM | POA: Diagnosis present

## 2023-10-09 DIAGNOSIS — N186 End stage renal disease: Secondary | ICD-10-CM

## 2023-10-09 DIAGNOSIS — Z79890 Hormone replacement therapy (postmenopausal): Secondary | ICD-10-CM

## 2023-10-09 DIAGNOSIS — E10649 Type 1 diabetes mellitus with hypoglycemia without coma: Secondary | ICD-10-CM | POA: Diagnosis present

## 2023-10-09 LAB — CBC
HCT: 41.1 % (ref 36.0–46.0)
Hemoglobin: 13 g/dL (ref 12.0–15.0)
MCH: 26.9 pg (ref 26.0–34.0)
MCHC: 31.6 g/dL (ref 30.0–36.0)
MCV: 84.9 fL (ref 80.0–100.0)
Platelets: 125 10*3/uL — ABNORMAL LOW (ref 150–400)
RBC: 4.84 MIL/uL (ref 3.87–5.11)
RDW: 17.2 % — ABNORMAL HIGH (ref 11.5–15.5)
WBC: 7.5 10*3/uL (ref 4.0–10.5)
nRBC: 0 % (ref 0.0–0.2)

## 2023-10-09 LAB — I-STAT CHEM 8, ED
BUN: 60 mg/dL — ABNORMAL HIGH (ref 6–20)
Calcium, Ion: 1.12 mmol/L — ABNORMAL LOW (ref 1.15–1.40)
Chloride: 91 mmol/L — ABNORMAL LOW (ref 98–111)
Creatinine, Ser: 9.6 mg/dL — ABNORMAL HIGH (ref 0.44–1.00)
Glucose, Bld: 231 mg/dL — ABNORMAL HIGH (ref 70–99)
HCT: 46 % (ref 36.0–46.0)
Hemoglobin: 15.6 g/dL — ABNORMAL HIGH (ref 12.0–15.0)
Potassium: 8.3 mmol/L (ref 3.5–5.1)
Sodium: 122 mmol/L — ABNORMAL LOW (ref 135–145)
TCO2: 23 mmol/L (ref 22–32)

## 2023-10-09 LAB — HCG, SERUM, QUALITATIVE: Preg, Serum: NEGATIVE

## 2023-10-09 MED ORDER — ALBUTEROL SULFATE (2.5 MG/3ML) 0.083% IN NEBU
10.0000 mg | INHALATION_SOLUTION | Freq: Once | RESPIRATORY_TRACT | Status: AC
  Administered 2023-10-10: 10 mg via RESPIRATORY_TRACT
  Filled 2023-10-09: qty 12

## 2023-10-09 MED ORDER — CHLORHEXIDINE GLUCONATE CLOTH 2 % EX PADS
6.0000 | MEDICATED_PAD | Freq: Every day | CUTANEOUS | Status: DC
  Administered 2023-10-10 – 2023-10-12 (×3): 6 via TOPICAL

## 2023-10-09 MED ORDER — INSULIN ASPART 100 UNIT/ML IV SOLN: 5.0000 [IU] | Freq: Once | INTRAVENOUS | Status: DC

## 2023-10-09 MED ORDER — SODIUM ZIRCONIUM CYCLOSILICATE 10 G PO PACK
10.0000 g | PACK | Freq: Once | ORAL | Status: AC
  Administered 2023-10-09: 10 g via ORAL
  Filled 2023-10-09: qty 1

## 2023-10-09 MED ORDER — CALCIUM GLUCONATE-NACL 1-0.675 GM/50ML-% IV SOLN
1.0000 g | Freq: Once | INTRAVENOUS | Status: AC
  Administered 2023-10-10: 1000 mg via INTRAVENOUS
  Filled 2023-10-09: qty 50

## 2023-10-09 NOTE — ED Provider Triage Note (Signed)
Emergency Medicine Provider Triage Evaluation Note  Angela [name] , a 37 y.o. female  was evaluated in triage.  Pt complains of weakness and chest pain.[name]    Physical Exam  BP (!) 179/96   Pulse 77   Temp 98.1 F (36.7 C)   Resp 18   Ht 5\' 4"  (1.626 m)   Wt 63.5 kg   SpO2 93%   BMI 24.03 kg/m  Somewhat decreased mental status.  Hypoxic.  Does have some rales.  Medical Decision Making  Medically screening exam initiated at 11:00 PM.  Appropriate orders placed.  Kita Huneke was informed that the remainder of the evaluation will be completed by another provider, this initial triage assessment does not replace that evaluation, and the importance of remaining in the ED until their evaluation is complete.  Patient with missed dialysis.  Chest pain.  Hypoxia.  High risk for volume overload states had been feeling weak however.  No coughing.  Differential diagnosis includes pneumonia.  However T waves are peaked on EKG which could go along with hyperkalemia.  Will get back to room quickly.   Benjiman Core, MD 10/09/23 [phone]

## 2023-10-09 NOTE — ED Triage Notes (Signed)
EMS reports pt has had generalized weakness today. EMS reports pt missed dialysis today due to not feeling well. Pt also c/o chest pain and leg pain that started this morning.
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
## 2023-10-10 DIAGNOSIS — R188 Other ascites: Secondary | ICD-10-CM | POA: Diagnosis not present

## 2023-10-10 DIAGNOSIS — E875 Hyperkalemia: Secondary | ICD-10-CM | POA: Diagnosis present

## 2023-10-10 LAB — CBC
HCT: 36.4 % (ref 36.0–46.0)
Hemoglobin: 11.1 g/dL — ABNORMAL LOW (ref 12.0–15.0)
MCH: 26.7 pg (ref 26.0–34.0)
MCHC: 30.5 g/dL (ref 30.0–36.0)
MCV: 87.5 fL (ref 80.0–100.0)
Platelets: 104 10*3/uL — ABNORMAL LOW (ref 150–400)
RBC: 4.16 MIL/uL (ref 3.87–5.11)
RDW: 17.4 % — ABNORMAL HIGH (ref 11.5–15.5)
WBC: 6.4 10*3/uL (ref 4.0–10.5)
nRBC: 0 % (ref 0.0–0.2)

## 2023-10-10 LAB — TROPONIN I (HIGH SENSITIVITY)
Troponin I (High Sensitivity): 16 ng/L (ref <18)
Troponin I (High Sensitivity): 17 ng/L (ref <18)

## 2023-10-10 LAB — HEPATITIS PANEL, ACUTE
HCV Ab: NONREACTIVE
Hep A IgM: NONREACTIVE
Hep B C IgM: NONREACTIVE
Hepatitis B Surface Ag: NONREACTIVE

## 2023-10-10 LAB — I-STAT VENOUS BLOOD GAS, ED
Acid-Base Excess: 1 mmol/L (ref 0.0–2.0)
Bicarbonate: 26.9 mmol/L (ref 20.0–28.0)
Calcium, Ion: 1.2 mmol/L (ref 1.15–1.40)
HCT: 43 % (ref 36.0–46.0)
Hemoglobin: 14.6 g/dL (ref 12.0–15.0)
O2 Saturation: 99 %
Potassium: 7.3 mmol/L (ref 3.5–5.1)
Sodium: 122 mmol/L — ABNORMAL LOW (ref 135–145)
TCO2: 28 mmol/L (ref 22–32)
pCO2, Ven: 48.2 mm[Hg] (ref 44–60)
pH, Ven: 7.355 (ref 7.25–7.43)
pO2, Ven: 163 mm[Hg] — ABNORMAL HIGH (ref 32–45)

## 2023-10-10 LAB — BASIC METABOLIC PANEL
Anion gap: 18 — ABNORMAL HIGH (ref 5–15)
Anion gap: 13 (ref 5–15)
BUN: 69 mg/dL — ABNORMAL HIGH (ref 6–20)
BUN: 32 mg/dL — ABNORMAL HIGH (ref 6–20)
CO2: 22 mmol/L (ref 22–32)
CO2: 26 mmol/L (ref 22–32)
Calcium: 10.4 mg/dL — ABNORMAL HIGH (ref 8.9–10.3)
Calcium: 9.7 mg/dL (ref 8.9–10.3)
Chloride: 87 mmol/L — ABNORMAL LOW (ref 98–111)
Chloride: 92 mmol/L — ABNORMAL LOW (ref 98–111)
Creatinine, Ser: 8.35 mg/dL — ABNORMAL HIGH (ref 0.44–1.00)
Creatinine, Ser: 5.59 mg/dL — ABNORMAL HIGH (ref 0.44–1.00)
GFR, Estimated: 6 mL/min — ABNORMAL LOW (ref >60)
GFR, Estimated: 9 mL/min — ABNORMAL LOW (ref >60)
Glucose, Bld: 231 mg/dL — ABNORMAL HIGH (ref 70–99)
Glucose, Bld: 170 mg/dL — ABNORMAL HIGH (ref 70–99)
Potassium: >7.5 mmol/L (ref 3.5–5.1)
Potassium: 5.1 mmol/L (ref 3.5–5.1)
Sodium: 127 mmol/L — ABNORMAL LOW (ref 135–145)
Sodium: 131 mmol/L — ABNORMAL LOW (ref 135–145)

## 2023-10-10 LAB — RESP PANEL BY RT-PCR (RSV, FLU A&B, COVID)  RVPGX2
Influenza A by PCR: NEGATIVE
Influenza B by PCR: NEGATIVE
Resp Syncytial Virus by PCR: NEGATIVE
SARS Coronavirus 2 by RT PCR: NEGATIVE

## 2023-10-10 LAB — GLUCOSE, CAPILLARY
Glucose-Capillary: 188 mg/dL — ABNORMAL HIGH (ref 70–99)
Glucose-Capillary: 107 mg/dL — ABNORMAL HIGH (ref 70–99)

## 2023-10-10 LAB — CBG MONITORING, ED
Glucose-Capillary: 226 mg/dL — ABNORMAL HIGH (ref 70–99)
Glucose-Capillary: 295 mg/dL — ABNORMAL HIGH (ref 70–99)

## 2023-10-10 LAB — HEPATITIS B SURFACE ANTIGEN: Hepatitis B Surface Ag: NONREACTIVE

## 2023-10-10 MED ORDER — HEPARIN SODIUM (PORCINE) 5000 UNIT/ML IJ SOLN
5000.0000 [IU] | Freq: Three times a day (TID) | INTRAMUSCULAR | Status: DC
  Administered 2023-10-10: 5000 [IU] via SUBCUTANEOUS
  Filled 2023-10-10 (×3): qty 1

## 2023-10-10 MED ORDER — HYDRALAZINE HCL 20 MG/ML IJ SOLN: 10.0000 mg | Freq: Once | INTRAMUSCULAR | Status: DC

## 2023-10-10 MED ORDER — CARVEDILOL 6.25 MG PO TABS
6.2500 mg | ORAL_TABLET | Freq: Two times a day (BID) | ORAL | Status: DC
  Administered 2023-10-10 – 2023-10-12 (×3): 6.25 mg via ORAL
  Filled 2023-10-10 (×3): qty 1

## 2023-10-10 MED ORDER — LEVETIRACETAM 500 MG PO TABS
500.0000 mg | ORAL_TABLET | Freq: Two times a day (BID) | ORAL | Status: DC
  Administered 2023-10-10 – 2023-10-12 (×5): 500 mg via ORAL
  Filled 2023-10-10 (×5): qty 1

## 2023-10-10 MED ORDER — CINACALCET HCL 30 MG PO TABS
30.0000 mg | ORAL_TABLET | Freq: Every evening | ORAL | Status: DC
  Administered 2023-10-10 – 2023-10-11 (×2): 30 mg via ORAL
  Filled 2023-10-10 (×2): qty 1

## 2023-10-10 MED ORDER — CALCITRIOL 0.5 MCG PO CAPS
1.0000 ug | ORAL_CAPSULE | ORAL | Status: DC
  Administered 2023-10-11: 1 ug via ORAL
  Filled 2023-10-10 (×2): qty 2

## 2023-10-10 MED ORDER — INSULIN GLARGINE-YFGN 100 UNIT/ML ~~LOC~~ SOLN
5.0000 [IU] | Freq: Every day | SUBCUTANEOUS | Status: DC
  Administered 2023-10-10 – 2023-10-11 (×2): 5 [IU] via SUBCUTANEOUS
  Filled 2023-10-10 (×3): qty 0.05

## 2023-10-10 MED ORDER — AMLODIPINE BESYLATE 10 MG PO TABS
10.0000 mg | ORAL_TABLET | Freq: Every day | ORAL | Status: DC
  Administered 2023-10-10 – 2023-10-11 (×2): 10 mg via ORAL
  Filled 2023-10-10 (×2): qty 1

## 2023-10-10 MED ORDER — LEVOTHYROXINE SODIUM 100 MCG PO TABS
200.0000 ug | ORAL_TABLET | Freq: Every day | ORAL | Status: DC
  Administered 2023-10-11 – 2023-10-12 (×2): 200 ug via ORAL
  Filled 2023-10-10: qty 1
  Filled 2023-10-10: qty 2

## 2023-10-10 MED ORDER — GABAPENTIN 100 MG PO CAPS
100.0000 mg | ORAL_CAPSULE | Freq: Three times a day (TID) | ORAL | Status: DC
Start: 2023-10-10 — End: 2023-10-12
  Administered 2023-10-10 – 2023-10-12 (×6): 100 mg via ORAL
  Filled 2023-10-10 (×7): qty 1

## 2023-10-10 MED ORDER — DEXTROSE 50 % IV SOLN
1.0000 | Freq: Once | INTRAVENOUS | Status: AC
  Administered 2023-10-10: 50 mL via INTRAVENOUS
  Filled 2023-10-10: qty 50

## 2023-10-10 MED ORDER — HYDRALAZINE HCL 25 MG PO TABS
100.0000 mg | ORAL_TABLET | Freq: Three times a day (TID) | ORAL | Status: DC
Start: 2023-10-10 — End: 2023-10-12
  Administered 2023-10-10 – 2023-10-12 (×6): 100 mg via ORAL
  Filled 2023-10-10 (×7): qty 4

## 2023-10-10 MED ORDER — ESCITALOPRAM OXALATE 10 MG PO TABS
20.0000 mg | ORAL_TABLET | Freq: Every day | ORAL | Status: DC
  Administered 2023-10-10 – 2023-10-12 (×3): 20 mg via ORAL
  Filled 2023-10-10 (×3): qty 2

## 2023-10-10 MED ORDER — SEVELAMER CARBONATE 800 MG PO TABS
1600.0000 mg | ORAL_TABLET | Freq: Three times a day (TID) | ORAL | Status: DC
  Administered 2023-10-10 – 2023-10-12 (×6): 1600 mg via ORAL
  Filled 2023-10-10 (×6): qty 2

## 2023-10-10 MED ORDER — METOCLOPRAMIDE HCL 5 MG PO TABS
5.0000 mg | ORAL_TABLET | Freq: Two times a day (BID) | ORAL | Status: DC
  Administered 2023-10-10 – 2023-10-12 (×3): 5 mg via ORAL
  Filled 2023-10-10 (×3): qty 1

## 2023-10-10 MED ORDER — HYDRALAZINE HCL 20 MG/ML IJ SOLN
10.0000 mg | Freq: Once | INTRAMUSCULAR | Status: AC
  Administered 2023-10-10: 10 mg via INTRAVENOUS
  Filled 2023-10-10: qty 1

## 2023-10-10 MED ORDER — INSULIN ASPART 100 UNIT/ML IV SOLN
5.0000 [IU] | Freq: Once | INTRAVENOUS | Status: AC
Start: 2023-10-10 — End: 2023-10-10
  Administered 2023-10-10: 5 [IU] via INTRAVENOUS

## 2023-10-10 MED ORDER — INSULIN ASPART 100 UNIT/ML IJ SOLN: 0.0000 [IU] | Freq: Every day | INTRAMUSCULAR | Status: DC

## 2023-10-10 MED ORDER — SODIUM ZIRCONIUM CYCLOSILICATE 5 G PO PACK
5.0000 g | PACK | Freq: Every day | ORAL | Status: DC
  Administered 2023-10-10 – 2023-10-12 (×3): 5 g via ORAL
  Filled 2023-10-10 (×4): qty 1

## 2023-10-10 MED ORDER — INSULIN ASPART 100 UNIT/ML IJ SOLN
0.0000 [IU] | Freq: Three times a day (TID) | INTRAMUSCULAR | Status: DC
  Administered 2023-10-10: 1 [IU] via SUBCUTANEOUS
  Administered 2023-10-12: 2 [IU] via SUBCUTANEOUS

## 2023-10-10 MED ORDER — DILTIAZEM HCL 30 MG PO TABS
60.0000 mg | ORAL_TABLET | Freq: Four times a day (QID) | ORAL | Status: DC
  Administered 2023-10-10 – 2023-10-12 (×6): 60 mg via ORAL
  Filled 2023-10-10 (×3): qty 1
  Filled 2023-10-10 (×2): qty 2
  Filled 2023-10-10: qty 1
  Filled 2023-10-10: qty 2
  Filled 2023-10-10: qty 1

## 2023-10-10 MED ORDER — BUSPIRONE HCL 5 MG PO TABS
15.0000 mg | ORAL_TABLET | Freq: Every day | ORAL | Status: DC
  Administered 2023-10-10 – 2023-10-11 (×2): 15 mg via ORAL
  Filled 2023-10-10 (×2): qty 1

## 2023-10-10 NOTE — Inpatient Diabetes Management (Signed)
Inpatient Diabetes Program Recommendations  AACE/ADA: New Consensus Statement on Inpatient Glycemic Control (2015)  Target Ranges:  Prepandial:   less than 140 mg/dL      Peak postprandial:   less than 180 mg/dL (1-2 hours)      Critically ill patients:  140 - 180 mg/dL   Lab Results  Component Value Date   GLUCAP 295 (H) 10/10/2023   HGBA1C 8.1 (A) 09/05/2023    Review of Glycemic Control  Latest Reference Range & Units 10/10/23 00:23 10/10/23 00:50  Glucose-Capillary 70 - 99 mg/dL 213 (H) 086 (H)   Diabetes history: DM type 1 Outpatient Diabetes medications: Tresiba 5 units bid, Novolog SSI Current orders for Inpatient glycemic control: None, in ED  Inpatient Diabetes Program Recommendations:    -   Order Semglee 5 units Daily -   Novolog 0-6 units tid + hs  Thanks,  Christena Deem RN, MSN, BC-ADM Inpatient Diabetes Coordinator Team Pager [phone] (8a-5p)

## 2023-10-10 NOTE — ED Notes (Signed)
Pt currently in dialysis

## 2023-10-10 NOTE — Progress Notes (Signed)
   10/10/23 0645  Vitals  Temp 98.4 F (36.9 C)  Temp Source Oral  BP (!) 187/106  MAP (mmHg) 130  BP Location Right Arm  BP Method Automatic  Patient Position (if appropriate) Lying  Pulse Rate (!) 105  Pulse Rate Source Monitor  Resp 14  Oxygen Therapy  SpO2 100 %  O2 Device Nasal Cannula  O2 Flow Rate (L/min) 2 L/min  During Treatment Monitoring  Blood Flow Rate (mL/min) 0 mL/min  Arterial Pressure (mmHg) 32.93 mmHg  Venous Pressure (mmHg) 235.54 mmHg  TMP (mmHg) 32.12 mmHg  Ultrafiltration Rate (mL/min) 1465 mL/min  Dialysate Flow Rate (mL/min) 299 ml/min  Dialysate Potassium Concentration 1  Dialysate Calcium Concentration 2.5  Duration of HD Treatment -hour(s) 2.67 hour(s)  Cumulative Fluid Removed (mL) per Treatment  2570.66  HD Safety Checks Performed Yes  Intra-Hemodialysis Comments Tx completed  Post Treatment  Dialyzer Clearance Lightly streaked  Liters Processed 63.3  Fluid Removed (mL) 2600 mL  Tolerated HD Treatment Yes  AVG/AVF Arterial Site Held (minutes) 10 minutes  AVG/AVF Venous Site Held (minutes) 10 minutes   Blood return 15 minutes early due to clotting.Dr. Glenna Fellows notified. Pt to return to E/R for futher evaluation. Hand to C. Salomon Fick RN
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
## 2023-10-10 NOTE — Plan of Care (Signed)
Patient alert/oriented X4. Patient compliant with medication administration and had an oxygen saturation of 83% on room air when admitted. Patient on 3L nasal cannula with an oxygen saturation of 100%. Patient skin assessed with Gilman Buttner, RN, no skin issues noted. Will continue to wean oxygen as tolerated.   Problem: Activity: Goal: Ability to tolerate increased activity will improve Outcome: Progressing   Problem: Respiratory: Goal: Ability to maintain a clear airway and adequate ventilation will improve Outcome: Progressing   Problem: Role Relationship: Goal: Method of communication will improve Outcome: Progressing   Problem: Education: Goal: Knowledge of disease or condition will improve Outcome: Progressing   Problem: Education: Goal: Knowledge of secondary prevention will improve (MUST DOCUMENT ALL) Outcome: Progressing   Problem: Education: Goal: Knowledge of patient specific risk factors will improve [name] N/A or DELETE if not current risk factor) Outcome: Progressing   Problem: Intracerebral Hemorrhage Tissue Perfusion: Goal: Complications of Intracerebral Hemorrhage will be minimized Outcome: Progressing   Problem: Coping: Goal: Will verbalize positive feelings about self Outcome: Progressing   Problem: Coping: Goal: Will identify appropriate support needs Outcome: Progressing   Problem: Health Behavior/Discharge Planning: Goal: Ability to manage health-related needs will improve Outcome: Progressing   Problem: Health Behavior/Discharge Planning: Goal: Goals will be collaboratively established with patient/family Outcome: Progressing   Problem: Self-Care: Goal: Ability to participate in self-care as condition permits will improve Outcome: Progressing   Problem: Self-Care: Goal: Verbalization of feelings and concerns over difficulty with self-care will improve Outcome: Progressing   Problem: Self-Care: Goal: Ability to communicate needs accurately will  improve Outcome: Progressing   Problem: Nutrition: Goal: Risk of aspiration will decrease Outcome: Progressing   Problem: Nutrition: Goal: Dietary intake will improve Outcome: Progressing   Problem: Education: Goal: Ability to describe self-care measures that may prevent or decrease complications (Diabetes Survival Skills Education) will improve Outcome: Progressing   Problem: Education: Goal: Individualized Educational Video(s) Outcome: Progressing   Problem: Coping: Goal: Ability to adjust to condition or change in health will improve Outcome: Progressing   Problem: Fluid Volume: Goal: Ability to maintain a balanced intake and output will improve Outcome: Progressing   Problem: Health Behavior/Discharge Planning: Goal: Ability to identify and utilize available resources and services will improve Outcome: Progressing   Problem: Health Behavior/Discharge Planning: Goal: Ability to manage health-related needs will improve Outcome: Progressing   Problem: Metabolic: Goal: Ability to maintain appropriate glucose levels will improve Outcome: Progressing   Problem: Nutritional: Goal: Maintenance of adequate nutrition will improve Outcome: Progressing   Problem: Nutritional: Goal: Progress toward achieving an optimal weight will improve Outcome: Progressing   Problem: Skin Integrity: Goal: Risk for impaired skin integrity will decrease Outcome: Progressing   Problem: Tissue Perfusion: Goal: Adequacy of tissue perfusion will improve Outcome: Progressing
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
## 2023-10-10 NOTE — ED Notes (Signed)
Tried to walk patient but patient wouldn't wake up or turn over for me notified RN of it

## 2023-10-10 NOTE — ED Notes (Signed)
Spoke to [employer] with dialysis, they will be sending transport to come get her

## 2023-10-10 NOTE — ED Provider Notes (Signed)
  Physical Exam  BP (!) 186/104   Pulse (!) 113   Temp 98.4 F (36.9 C) (Oral)   Resp 15   Ht 5\' 4"  (1.626 m)   Wt 63.5 kg   SpO2 100%   BMI 24.03 kg/m   Physical Exam  Procedures  Procedures  ED Course / MDM   Clinical Course as of 10/10/23 1036  Mon Oct 09, 2023  2335 BP elevated, likely due to missed HD  [SG]  2344 CXR with fluid overload  [SG]  2349 Spoke with Dr. Glenna Fellows will arrange for dialysis [SG]  Tue Oct 10, 2023  0128 BP(!): 198/84 Did not take evening anti-hypertensive, also missed HD, will give hydralazine  [SG]  0658 Assumed care from Dr [name] Cullens. 37 yo F with hx of ESRD on MWF iHD who missed yesterday. Presented with hypoxia of 2L and K >7.5. Is now at [facility]. Will reassess afterwards and see if she needs to be admitted.  [RP]  [phone] Patient has returned from dialysis.  Was satting in the mid 80s on 0.5 L nasal cannula.  Will admit since she may need an additional dialysis session.  Will repeat chemistry in 2 hours. [RP]  0826 Dr Natale Milch from hospitalist to admit.  [RP]    Clinical Course User Index [RP] Rondel Baton, MD [SG] Sloan Leiter, DO   Medical Decision Making Amount and/or Complexity of Data Reviewed Labs: ordered. Radiology: ordered.  Risk OTC drugs. Prescription drug management. Decision regarding hospitalization.      Rondel Baton, MD 10/10/23 [phone]
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
## 2023-10-10 NOTE — ED Notes (Signed)
Pt remains in dialysis unit

## 2023-10-10 NOTE — ED Notes (Signed)
Pt went to dialysis.

## 2023-10-11 ENCOUNTER — Encounter (HOSPITAL_COMMUNITY): Payer: Self-pay | Admitting: Internal Medicine

## 2023-10-11 DIAGNOSIS — J9601 Acute respiratory failure with hypoxia: Secondary | ICD-10-CM | POA: Diagnosis present

## 2023-10-11 DIAGNOSIS — Z992 Dependence on renal dialysis: Secondary | ICD-10-CM | POA: Diagnosis not present

## 2023-10-11 DIAGNOSIS — K219 Gastro-esophageal reflux disease without esophagitis: Secondary | ICD-10-CM | POA: Diagnosis present

## 2023-10-11 DIAGNOSIS — J45909 Unspecified asthma, uncomplicated: Secondary | ICD-10-CM | POA: Diagnosis present

## 2023-10-11 DIAGNOSIS — Z79890 Hormone replacement therapy (postmenopausal): Secondary | ICD-10-CM | POA: Diagnosis not present

## 2023-10-11 DIAGNOSIS — E103593 Type 1 diabetes mellitus with proliferative diabetic retinopathy without macular edema, bilateral: Secondary | ICD-10-CM | POA: Diagnosis present

## 2023-10-11 DIAGNOSIS — I120 Hypertensive chronic kidney disease with stage 5 chronic kidney disease or end stage renal disease: Secondary | ICD-10-CM | POA: Diagnosis present

## 2023-10-11 DIAGNOSIS — J811 Chronic pulmonary edema: Secondary | ICD-10-CM | POA: Diagnosis present

## 2023-10-11 DIAGNOSIS — E039 Hypothyroidism, unspecified: Secondary | ICD-10-CM | POA: Diagnosis present

## 2023-10-11 DIAGNOSIS — D631 Anemia in chronic kidney disease: Secondary | ICD-10-CM | POA: Diagnosis present

## 2023-10-11 DIAGNOSIS — Z91199 Patient's noncompliance with other medical treatment and regimen due to unspecified reason: Secondary | ICD-10-CM

## 2023-10-11 DIAGNOSIS — Z8679 Personal history of other diseases of the circulatory system: Secondary | ICD-10-CM

## 2023-10-11 DIAGNOSIS — E1040 Type 1 diabetes mellitus with diabetic neuropathy, unspecified: Secondary | ICD-10-CM | POA: Diagnosis present

## 2023-10-11 DIAGNOSIS — E875 Hyperkalemia: Secondary | ICD-10-CM | POA: Diagnosis present

## 2023-10-11 DIAGNOSIS — F329 Major depressive disorder, single episode, unspecified: Secondary | ICD-10-CM | POA: Diagnosis present

## 2023-10-11 DIAGNOSIS — E782 Mixed hyperlipidemia: Secondary | ICD-10-CM | POA: Diagnosis present

## 2023-10-11 DIAGNOSIS — E8770 Fluid overload, unspecified: Secondary | ICD-10-CM | POA: Diagnosis present

## 2023-10-11 DIAGNOSIS — N186 End stage renal disease: Secondary | ICD-10-CM

## 2023-10-11 DIAGNOSIS — Z1152 Encounter for screening for COVID-19: Secondary | ICD-10-CM | POA: Diagnosis not present

## 2023-10-11 DIAGNOSIS — G40909 Epilepsy, unspecified, not intractable, without status epilepticus: Secondary | ICD-10-CM | POA: Diagnosis present

## 2023-10-11 DIAGNOSIS — Z794 Long term (current) use of insulin: Secondary | ICD-10-CM | POA: Diagnosis not present

## 2023-10-11 DIAGNOSIS — E10649 Type 1 diabetes mellitus with hypoglycemia without coma: Secondary | ICD-10-CM | POA: Diagnosis not present

## 2023-10-11 DIAGNOSIS — E1022 Type 1 diabetes mellitus with diabetic chronic kidney disease: Secondary | ICD-10-CM | POA: Diagnosis present

## 2023-10-11 DIAGNOSIS — N2581 Secondary hyperparathyroidism of renal origin: Secondary | ICD-10-CM | POA: Diagnosis present

## 2023-10-11 DIAGNOSIS — G43909 Migraine, unspecified, not intractable, without status migrainosus: Secondary | ICD-10-CM | POA: Diagnosis present

## 2023-10-11 LAB — GLUCOSE, CAPILLARY
Glucose-Capillary: 88 mg/dL (ref 70–99)
Glucose-Capillary: 106 mg/dL — ABNORMAL HIGH (ref 70–99)
Glucose-Capillary: 86 mg/dL (ref 70–99)
Glucose-Capillary: 110 mg/dL — ABNORMAL HIGH (ref 70–99)

## 2023-10-11 LAB — CBC
HCT: 35.5 % — ABNORMAL LOW (ref 36.0–46.0)
Hemoglobin: 11.1 g/dL — ABNORMAL LOW (ref 12.0–15.0)
MCH: 26.6 pg (ref 26.0–34.0)
MCHC: 31.3 g/dL (ref 30.0–36.0)
MCV: 84.9 fL (ref 80.0–100.0)
Platelets: 109 10*3/uL — ABNORMAL LOW (ref 150–400)
RBC: 4.18 MIL/uL (ref 3.87–5.11)
RDW: 17 % — ABNORMAL HIGH (ref 11.5–15.5)
WBC: 5.2 10*3/uL (ref 4.0–10.5)
nRBC: 0 % (ref 0.0–0.2)

## 2023-10-11 LAB — COMPREHENSIVE METABOLIC PANEL
ALT: 28 U/L (ref 0–44)
AST: 37 U/L (ref 15–41)
Albumin: 3.6 g/dL (ref 3.5–5.0)
Alkaline Phosphatase: 150 U/L — ABNORMAL HIGH (ref 38–126)
Anion gap: 13 (ref 5–15)
BUN: 39 mg/dL — ABNORMAL HIGH (ref 6–20)
CO2: 28 mmol/L (ref 22–32)
Calcium: 9.1 mg/dL (ref 8.9–10.3)
Chloride: 90 mmol/L — ABNORMAL LOW (ref 98–111)
Creatinine, Ser: 7.08 mg/dL — ABNORMAL HIGH (ref 0.44–1.00)
GFR, Estimated: 7 mL/min — ABNORMAL LOW (ref >60)
Glucose, Bld: 110 mg/dL — ABNORMAL HIGH (ref 70–99)
Potassium: 6.2 mmol/L — ABNORMAL HIGH (ref 3.5–5.1)
Sodium: 131 mmol/L — ABNORMAL LOW (ref 135–145)
Total Bilirubin: 0.8 mg/dL (ref 0.3–1.2)
Total Protein: 6.3 g/dL — ABNORMAL LOW (ref 6.5–8.1)

## 2023-10-11 LAB — HEPATITIS B SURFACE ANTIBODY, QUANTITATIVE: Hep B S AB Quant (Post): 39 m[IU]/mL

## 2023-10-11 LAB — HEMOGLOBIN A1C
Hgb A1c MFr Bld: 6.6 % — ABNORMAL HIGH (ref 4.8–5.6)
Mean Plasma Glucose: 142.72 mg/dL

## 2023-10-11 MED ORDER — ACETAMINOPHEN 325 MG PO TABS: 650.0000 mg | ORAL_TABLET | Freq: Four times a day (QID) | ORAL | Status: DC | PRN

## 2023-10-11 MED ORDER — ACETAMINOPHEN 500 MG PO TABS
1000.0000 mg | ORAL_TABLET | Freq: Once | ORAL | Status: AC
  Administered 2023-10-11: 1000 mg via ORAL
  Filled 2023-10-11: qty 2

## 2023-10-11 NOTE — Assessment & Plan Note (Signed)
Misses HD sessions on a regular basis.

## 2023-10-11 NOTE — Plan of Care (Signed)
Problem: Education: Goal: Knowledge of General Education information will improve Description: Including pain rating scale, medication(s)/side effects and non-pharmacologic comfort measures Outcome: Progressing Pt understands she was admitted into the hospital for shortness of breath in the setting of missed dialysis appointment on Monday.  She had HD yesterday and is pending for HD today.    Problem: Clinical Measurements: Goal: Will remain free from infection Outcome: Progressing S/Sx of infection monitored and assessed q-shift.  Pt has remained afebrile thus far.       Problem: Clinical Measurements: Goal: Respiratory complications will improve Outcome: Progressing Respiratory status monitored and assessed q-shift.  Pt is on 2L of O2 with PO2 at 94-100% and respiration rate of 18-19 breaths per minute.  Pt has not endorsed c/o SOB or DOE.    Problem: Clinical Measurements: Goal: Cardiovascular complication will be avoided Outcome: Progressing Pt's VS is hypertensive. She is on antihypertensives per MD's orders.     Problem: Activity: Goal: Risk for activity intolerance will decrease Outcome: Progressing Pt is independent of all her ADLs.  She can get OOB w/o the assistance of RN staff.    Problem: Nutrition: Goal: Adequate nutrition will be maintained Outcome: Progressing Pt is on a carb modified/ renal diet per MD's orders and can tolerate it w/o s/sx of abdominal pain/ distention or n/v.     Problem: Safety: Goal: Ability to remain free from injury will improve Outcome: Progressing Pt has remained free from falls thus far.  Instructed pt to utilize RN call light for assistance.  Hourly rounds performed. Bed in lowest position, locked with two upper side rails engaged.  Belongings and call light within reach.    Problem: Skin Integrity: Goal: Risk for impaired skin integrity will decrease Outcome: Progressing Skin integrity monitored and assessed q-shift.  Instructed pt to  q2 hours to prevent further skin impairment.  Tubes and drains assessed for device related pressures sores.  Pt is continent of both bowel and bladder.     Problem: Education: Goal: Ability to describe self-care measures that may prevent or decrease complications (Diabetes Survival Skills Education) will improve Outcome: Progressing Pt has a history of diabetes.  Per MD's orders pt's CBGs are to be checked ACHS.  She is on insulin coverage per slinding scale per MD's orders.

## 2023-10-11 NOTE — Assessment & Plan Note (Addendum)
10-11-2023 continue with hydralazine, norvasc, cardizem and coreg. Odd that pt is on both norvasc and cardizem. But that has been verified by pharmacy as pt's home regimen.

## 2023-10-11 NOTE — Assessment & Plan Note (Signed)
10-11-2023 Continue with keppra and neurontin

## 2023-10-11 NOTE — Progress Notes (Signed)
Subjective: Seen in room, denies shortness of breath, for dialysis today  on normal schedule schedule.  This a.m. denies nausea or vomiting  Objective Vital signs in last 24 hours: Vitals:   10/10/23 2304 10/11/23 0106 10/11/23 0500 10/11/23 0727  BP: (!) 143/71 (!) 152/82 (!) 150/85 (!) 167/88  Pulse: 94 95 87 86  Resp: 19 18 19    Temp: 98.6 F (37 C) 97.7 F (36.5 C) 98.2 F (36.8 C) 97.8 F (36.6 C)  TempSrc: Oral Oral Oral Oral  SpO2: (!) 85% 97% 100% 94%  Weight:      Height:       Weight change:   Physical Exam: General: Alert, appropriate female NAD Heart: RRR no MRG Lungs: CTA bilaterally nonlabored breathing Abdomen: NABS, soft NTND Extremities: No pedal edema Dialysis Access: LUA AV fistula positive bruit  OP HD orders: [place]. MWF. 3hrs . EDW 59.7kg. 2k/2cal. Flow rates: 400/autoflow 1.5. AVF15g. No heparin. Meds: Venofer 100mg  until 11/01/23; calcitriol q hd    Problem/Plan:  Pulm edema -UF as tolerated on admission and today to make sure his standing weights, BED  weights are not accurate.  This a.m. no shortness of breath O2 sat 94% room air   Hyperkalemia -s/p HD on admission evening, still elevated K6.2, dosing with Lokelma and repeat dialysis today    ESRD HD today per MWF schedule  Volume/ hypertension  -UF as tolerated, resume home BP meds   Anemia of Chronic Kidney Disease Hemoglobin 11.1 no indication for ESA/Fe at this time.  -Transfuse PRN for Hgb <7   Secondary Hyperparathyroidism/Hyperphosphatemia -Admit calcium elevated improving now resume home binders if on any, will resume calcitriol. Check PO4   Lenny Pastel, PA-C [facility] Beeper [phone] 10/11/2023,9:37 AM  LOS: 0 days   Labs: Basic Metabolic Panel: Recent Labs  Lab 10/09/23 2306 10/10/23 0208 10/10/23 1118 10/11/23 0442  NA 127*  122* 122* 131* 131*  K >7.5*  8.3* 7.3* 5.1 6.2*  CL 87*  91*  --  92* 90*  CO2 22  --  26 28  GLUCOSE  231*  231*  --  170* 110*  BUN 69*  60*  --  32* 39*  CREATININE 8.35*  9.60*  --  5.59* 7.08*  CALCIUM 10.4*  --  9.7 9.1   Liver Function Tests: Recent Labs  Lab 10/11/23 0442  AST 37  ALT 28  ALKPHOS 150*  BILITOT 0.8  PROT 6.3*  ALBUMIN 3.6   No results for input(s): "LIPASE", "AMYLASE" in the last 168 hours. No results for input(s): "AMMONIA" in the last 168 hours. CBC: Recent Labs  Lab 10/09/23 2306 10/10/23 0208 10/10/23 1118 10/11/23 0442  WBC 7.5  --  6.4 5.2  HGB 13.0  15.6* 14.6 11.1* 11.1*  HCT 41.1  46.0 43.0 36.4 35.5*  MCV 84.9  --  87.5 84.9  PLT 125*  --  104* 109*   Cardiac Enzymes: No results for input(s): "CKTOTAL", "CKMB", "CKMBINDEX", "TROPONINI" in the last 168 hours. CBG: Recent Labs  Lab 10/10/23 0050 10/10/23 1724 10/10/23 2129 10/11/23 0548 10/11/23 0932  GLUCAP 295* 188* 107* 88 106*    Studies/Results: DG Chest 2 View  Result Date: 10/09/2023 CLINICAL DATA:  Chest pain. EXAM: CHEST - 2 VIEW COMPARISON:  Radiograph and CT 08/07/2023 FINDINGS: Chronic cardiomegaly, unchanged mediastinal contours. Increased pulmonary edema from prior exam. Small pleural effusions with fluid in the fissures. No pneumothorax. IMPRESSION: Cardiomegaly with increased pulmonary edema and small pleural  effusions. Electronically Signed   By: Narda Rutherford M.D.   On: 10/09/2023 23:38   Medications:   amLODipine  10 mg Oral QHS   busPIRone  15 mg Oral QHS   calcitRIOL  1 mcg Oral Q M,W,F-HD   carvedilol  6.25 mg Oral BID WC   Chlorhexidine Gluconate Cloth  6 each Topical Q0600   cinacalcet  30 mg Oral QPM   diltiazem  60 mg Oral Q6H   escitalopram  20 mg Oral Daily   gabapentin  100 mg Oral TID   heparin  5,000 Units Subcutaneous Q8H   hydrALAZINE  100 mg Oral TID   insulin aspart  0-5 Units Subcutaneous QHS   insulin aspart  0-6 Units Subcutaneous TID WC   insulin glargine-yfgn  5 Units Subcutaneous QHS   levETIRAcetam  500 mg Oral BID    levothyroxine  200 mcg Oral QAC breakfast   metoCLOPramide  5 mg Oral BID WC   sevelamer carbonate  1,600 mg Oral TID WC   sodium zirconium cyclosilicate  5 g Oral Daily

## 2023-10-11 NOTE — Progress Notes (Signed)
Pt receives out-pt HD at [facility] on MWF. Will assist as needed.   Tracy Mounce Renal Navigator [phone] 

## 2023-10-11 NOTE — Progress Notes (Signed)
PROGRESS NOTE    Angela [name]  ONG:[id number] DOB: [birth year] DOA: 10/09/2023 PCP: Marcine Matar, MD  Subjective: Pt seen and examined. Lying supine in bed, right lateral recumbent. On 2 L/min O2.  No complaints   Hospital Course: HPI: Angela [name] is a 37 y.o. 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   Significant Events: Admitted 10/09/2023 10-10-2023 taken for emergent HD due to hyperkalemia with hyperpeaked T-waves  Significant Labs: Admitting K of >7.5 with hyperpeaked T-wave on EKG  Significant Imaging Studies: Admitting CXR shows Cardiomegaly with increased pulmonary edema and small pleural effusions  Antibiotic Therapy: Anti-infectives (From admission, onward)    None       Procedures: 10-10-2023 emergent HD  Consultants: Nephrology    Assessment and Plan: * Acute hyperkalemia Admitted for acute hyperkalemia with hyperpeaked T-waves on EKG. Pt had emergent HD on 10-10-2023   10-11-2023 pt still above dry weight of 59.7 kg. today's weight of 63.5 kg. Pt on M, W, F outpatient schedule. Plan for repeat HD today. Already on daily po lokelma  Acute respiratory failure with hypoxia (HCC) 10-11-2023. Noted to have RA sats of 83% on 10-10-2023. On supplemental O2. Does not use home O2. Wean to RA as tolerated. Due to pulmonary edema from volume overload due to missed HD sessions/non-compliance with medical therapy.  ESRD on dialysis (HCC) 10-11-2023 on HD on M, W, F. Has been missing HD sessions. Still with upper eyelid edema. Still with hyperkalemia. On renal diet. Should be getting another HD session today.  Non compliance with medical treatment Misses HD sessions on a regular  basis.  History of cerebral hemorrhage Chronic.  Seizure (HCC) 10-11-2023 Continue with keppra and neurontin  Mixed hyperlipidemia Stable.  HTN (hypertension) 10-11-2023 continue with hydralazine, norvasc, cardizem and coreg. Odd that pt is on both norvasc and cardizem. But that has been verified by pharmacy as pt's home regimen.  Uncontrolled type 1 diabetes mellitus with hypoglycemia, with long-term current use of insulin (HCC) 10-11-2023 continue with lantus, mealtime and SSI. Check A1c.   DVT prophylaxis: heparin injection 5,000 Units Start: 10/10/23 1400    Code Status: Full Code Family Communication: no family at bedside. Pt is decisional Disposition Plan: return home Reason for continuing need for hospitalization: continued need for inpatient HD due to persistent hyperkalemia, continued use of supplemental O2(pt not on home O2)  Objective: Vitals:   10/10/23 2304 10/11/23 0106 10/11/23 0500 10/11/23 0727  BP: (!) 143/71 (!) 152/82 (!) 150/85 (!) 167/88  Pulse: 94 95 87 86  Resp: 19 18 19    Temp: 98.6 F (37 C) 97.7 F (36.5 C) 98.2 F (36.8 C) 97.8 F (36.6 C)  TempSrc: Oral Oral Oral Oral  SpO2: (!) 85% 97% 100% 94%  Weight:      Height:        Intake/Output Summary (Last 24 hours) at 10/11/2023 0917 Last data filed at 10/10/2023 2033 Gross per 24 hour  Intake 240 ml  Output --  Net 240 ml   Filed Weights   10/09/23 2236  Weight: 63.5 kg    Examination:  Physical Exam Vitals and nursing note reviewed.  Constitutional:      General: She is not in acute distress.    Appearance: She is normal weight.  She is not toxic-appearing or diaphoretic.  HENT:     Head: Normocephalic and atraumatic.  Eyes:     General: No scleral icterus.    Comments: +upper eyelid edema bilaterally  Cardiovascular:     Rate and Rhythm: Normal rate and regular rhythm.  Pulmonary:     Effort: Pulmonary effort is normal. No respiratory distress.     Breath sounds: Normal  breath sounds.  Abdominal:     General: Bowel sounds are normal. There is no distension.     Palpations: Abdomen is soft.     Tenderness: There is no abdominal tenderness.  Musculoskeletal:     Right lower leg: No edema.     Left lower leg: No edema.  Skin:    General: Skin is warm and dry.     Capillary Refill: Capillary refill takes less than 2 seconds.  Neurological:     General: No focal deficit present.     Mental Status: She is alert and oriented to person, place, and time.    Data Reviewed: I have personally reviewed following labs and imaging studies  CBC: Recent Labs  Lab 10/09/23 2306 10/10/23 0208 10/10/23 1118 10/11/23 0442  WBC 7.5  --  6.4 5.2  HGB 13.0  15.6* 14.6 11.1* 11.1*  HCT 41.1  46.0 43.0 36.4 35.5*  MCV 84.9  --  87.5 84.9  PLT 125*  --  104* 109*   Basic Metabolic Panel: Recent Labs  Lab 10/09/23 2306 10/10/23 0208 10/10/23 1118 10/11/23 0442  NA 127*  122* 122* 131* 131*  K >7.5*  8.3* 7.3* 5.1 6.2*  CL 87*  91*  --  92* 90*  CO2 22  --  26 28  GLUCOSE 231*  231*  --  170* 110*  BUN 69*  60*  --  32* 39*  CREATININE 8.35*  9.60*  --  5.59* 7.08*  CALCIUM 10.4*  --  9.7 9.1   GFR: Estimated Creatinine Clearance: 9.3 mL/min (A) (by C-G formula based on SCr of 7.08 mg/dL (H)). Liver Function Tests: Recent Labs  Lab 10/11/23 0442  AST 37  ALT 28  ALKPHOS 150*  BILITOT 0.8  PROT 6.3*  ALBUMIN 3.6   CBG: Recent Labs  Lab 10/10/23 0023 10/10/23 0050 10/10/23 1724 10/10/23 2129 10/11/23 0548  GLUCAP 226* 295* 188* 107* 88    Recent Results (from the past 240 hour(s))  Resp panel by RT-PCR (RSV, Flu A&B, Covid) Anterior Nasal Swab     Status: None   Collection Time: 10/09/23 11:49 PM   Specimen: Anterior Nasal Swab  Result Value Ref Range Status   SARS Coronavirus 2 by RT PCR NEGATIVE NEGATIVE Final   Influenza A by PCR NEGATIVE NEGATIVE Final   Influenza B by PCR NEGATIVE NEGATIVE Final    Comment: (NOTE) The  Xpert Xpress SARS-CoV-2/FLU/RSV plus assay is intended as an aid in the diagnosis of influenza from Nasopharyngeal swab specimens and should not be used as a sole basis for treatment. Nasal washings and aspirates are unacceptable for Xpert Xpress SARS-CoV-2/FLU/RSV testing.  Fact Sheet for Patients: BloggerCourse.com  Fact Sheet for Healthcare Providers: SeriousBroker.it  This test is not yet approved or cleared by the [place] FDA and has been authorized for detection and/or diagnosis of SARS-CoV-2 by FDA under an Emergency Use Authorization (EUA). This EUA will remain in effect (meaning this test can be used) for the duration of the COVID-19 declaration under Section 564(b)(1) of the Act, 21 U.S.C. section  360bbb-3(b)(1), unless the authorization is terminated or revoked.     Resp Syncytial Virus by PCR NEGATIVE NEGATIVE Final    Comment: (NOTE) Fact Sheet for Patients: BloggerCourse.com  Fact Sheet for Healthcare Providers: SeriousBroker.it  This test is not yet approved or cleared by the [place] FDA and has been authorized for detection and/or diagnosis of SARS-CoV-2 by FDA under an Emergency Use Authorization (EUA). This EUA will remain in effect (meaning this test can be used) for the duration of the COVID-19 declaration under Section 564(b)(1) of the Act, 21 U.S.C. section 360bbb-3(b)(1), unless the authorization is terminated or revoked.  Performed at [facility], [address], [place], [place] [address]      Radiology Studies: DG Chest 2 View  Result Date: 10/09/2023 CLINICAL DATA:  Chest pain. EXAM: CHEST - 2 VIEW COMPARISON:  Radiograph and CT 08/07/2023 FINDINGS: Chronic cardiomegaly, unchanged mediastinal contours. Increased pulmonary edema from prior exam. Small pleural effusions with fluid in the fissures. No pneumothorax. IMPRESSION:  Cardiomegaly with increased pulmonary edema and small pleural effusions. Electronically Signed   By: Narda Rutherford M.D.   On: 10/09/2023 23:38    Scheduled Meds:  amLODipine  10 mg Oral QHS   busPIRone  15 mg Oral QHS   calcitRIOL  1 mcg Oral Q M,W,F-HD   carvedilol  6.25 mg Oral BID WC   Chlorhexidine Gluconate Cloth  6 each Topical Q0600   cinacalcet  30 mg Oral QPM   diltiazem  60 mg Oral Q6H   escitalopram  20 mg Oral Daily   gabapentin  100 mg Oral TID   heparin  5,000 Units Subcutaneous Q8H   hydrALAZINE  100 mg Oral TID   insulin aspart  0-5 Units Subcutaneous QHS   insulin aspart  0-6 Units Subcutaneous TID WC   insulin glargine-yfgn  5 Units Subcutaneous QHS   levETIRAcetam  500 mg Oral BID   levothyroxine  200 mcg Oral QAC breakfast   metoCLOPramide  5 mg Oral BID WC   sevelamer carbonate  1,600 mg Oral TID WC   sodium zirconium cyclosilicate  5 g Oral Daily   Continuous Infusions:   LOS: 0 days   Time spent: 40 minutes  Carollee Herter, DO  [facility]  10/11/2023, 9:17 AM

## 2023-10-11 NOTE — Assessment & Plan Note (Signed)
Stable

## 2023-10-11 NOTE — Progress Notes (Signed)
Pt to be transferred to 2W for escalation of care d/t hyperkalemia with a K+ of 6.2 where MD wanted continuous telemetry monitoring d/t the risk of arrhythmias.  Report was called and given to [place], Charity fundraiser.  Pt was transported in stable condition via wheelchair accompanied by Jacques Earthly, NT.

## 2023-10-11 NOTE — Assessment & Plan Note (Addendum)
10-11-2023 continue with lantus, mealtime and SSI. Check A1c.

## 2023-10-11 NOTE — Assessment & Plan Note (Signed)
Chronic. 

## 2023-10-11 NOTE — Assessment & Plan Note (Signed)
10-11-2023. Noted to have RA sats of 83% on 10-10-2023. On supplemental O2. Does not use home O2. Wean to RA as tolerated. Due to pulmonary edema from volume overload due to missed HD sessions/non-compliance with medical therapy.

## 2023-10-11 NOTE — Subjective & Objective (Signed)
Pt seen and examined. Lying supine in bed, right lateral recumbent. On 2 L/min O2.  No complaints

## 2023-10-11 NOTE — Assessment & Plan Note (Signed)
10-11-2023 on HD on M, W, F. Has been missing HD sessions. Still with upper eyelid edema. Still with hyperkalemia. On renal diet. Should be getting another HD session today.

## 2023-10-11 NOTE — Hospital Course (Signed)
HPI: Angela [name] is a 37 y.o. 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   Significant Events: Admitted 10/09/2023 10-10-2023 taken for emergent HD due to hyperkalemia with hyperpeaked T-waves  Significant Labs: Admitting K of >7.5 with hyperpeaked T-wave on EKG  Significant Imaging Studies: Admitting CXR shows Cardiomegaly with increased pulmonary edema and small pleural effusions  Antibiotic Therapy: Anti-infectives (From admission, onward)    None       Procedures: 10-10-2023 emergent HD  Consultants: Nephrology

## 2023-10-11 NOTE — Assessment & Plan Note (Addendum)
Admitted for acute hyperkalemia with hyperpeaked T-waves on EKG. Pt had emergent HD on 10-10-2023   10-11-2023 pt still above dry weight of 59.7 kg. today's weight of 63.5 kg. Pt on M, W, F outpatient schedule. Plan for repeat HD today. Already on daily po lokelma

## 2023-10-11 NOTE — Progress Notes (Addendum)
Calcitriol 1 mcg PO given. Pt completed HD tx without issue.  10/11/23 2039  Vitals  Temp 98 F (36.7 C)  Temp Source Oral  BP Location Right Arm  BP Method Automatic  Patient Position (if appropriate) Lying  Oxygen Therapy  SpO2 94 %  O2 Device Room Air  During Treatment Monitoring  Intra-Hemodialysis Comments Tx completed  Post Treatment  Dialyzer Clearance Lightly streaked  Hemodialysis Intake (mL) 0 mL  Liters Processed 72  Fluid Removed (mL) 3000 mL  Tolerated HD Treatment Yes  Post-Hemodialysis Comments Pt goal met.  AVG/AVF Arterial Site Held (minutes) 10 minutes  AVG/AVF Venous Site Held (minutes) 10 minutes

## 2023-10-11 NOTE — Plan of Care (Signed)
  Problem: Education: Goal: Knowledge of disease or condition will improve Outcome: Progressing Goal: Knowledge of secondary prevention will improve (MUST DOCUMENT ALL) Outcome: Progressing Goal: Knowledge of patient specific risk factors will improve [name] N/A or DELETE if not current risk factor) Outcome: Progressing   Problem: Intracerebral Hemorrhage Tissue Perfusion: Goal: Complications of Intracerebral Hemorrhage will be minimized Outcome: Progressing   Problem: Coping: Goal: Will verbalize positive feelings about self Outcome: Progressing Goal: Will identify appropriate support needs Outcome: Progressing   Problem: Health Behavior/Discharge Planning: Goal: Ability to manage health-related needs will improve Outcome: Progressing Goal: Goals will be collaboratively established with patient/family Outcome: Progressing   Problem: Self-Care: Goal: Ability to participate in self-care as condition permits will improve Outcome: Progressing Goal: Verbalization of feelings and concerns over difficulty with self-care will improve Outcome: Progressing Goal: Ability to communicate needs accurately will improve Outcome: Progressing   Problem: Nutrition: Goal: Risk of aspiration will decrease Outcome: Progressing Goal: Dietary intake will improve Outcome: Progressing   Problem: Education: Goal: Ability to describe self-care measures that may prevent or decrease complications (Diabetes Survival Skills Education) will improve Outcome: Progressing Goal: Individualized Educational Video(s) Outcome: Progressing   Problem: Coping: Goal: Ability to adjust to condition or change in health will improve Outcome: Progressing   Problem: Fluid Volume: Goal: Ability to maintain a balanced intake and output will improve Outcome: Progressing   Problem: Health Behavior/Discharge Planning: Goal: Ability to identify and utilize available resources and services will improve Outcome:  Progressing Goal: Ability to manage health-related needs will improve Outcome: Progressing   Problem: Metabolic: Goal: Ability to maintain appropriate glucose levels will improve Outcome: Progressing   Problem: Nutritional: Goal: Maintenance of adequate nutrition will improve Outcome: Progressing Goal: Progress toward achieving an optimal weight will improve Outcome: Progressing   Problem: Skin Integrity: Goal: Risk for impaired skin integrity will decrease Outcome: Progressing   Problem: Tissue Perfusion: Goal: Adequacy of tissue perfusion will improve Outcome: Progressing

## 2023-10-12 DIAGNOSIS — E875 Hyperkalemia: Secondary | ICD-10-CM | POA: Diagnosis not present

## 2023-10-12 LAB — BASIC METABOLIC PANEL
Anion gap: 14 (ref 5–15)
BUN: 26 mg/dL — ABNORMAL HIGH (ref 6–20)
CO2: 27 mmol/L (ref 22–32)
Calcium: 9.3 mg/dL (ref 8.9–10.3)
Chloride: 91 mmol/L — ABNORMAL LOW (ref 98–111)
Creatinine, Ser: 5.57 mg/dL — ABNORMAL HIGH (ref 0.44–1.00)
GFR, Estimated: 9 mL/min — ABNORMAL LOW (ref >60)
Glucose, Bld: 85 mg/dL (ref 70–99)
Potassium: 5 mmol/L (ref 3.5–5.1)
Sodium: 132 mmol/L — ABNORMAL LOW (ref 135–145)

## 2023-10-12 LAB — CBC
HCT: 35.7 % — ABNORMAL LOW (ref 36.0–46.0)
Hemoglobin: 11.3 g/dL — ABNORMAL LOW (ref 12.0–15.0)
MCH: 26.6 pg (ref 26.0–34.0)
MCHC: 31.7 g/dL (ref 30.0–36.0)
MCV: 84 fL (ref 80.0–100.0)
Platelets: 137 10*3/uL — ABNORMAL LOW (ref 150–400)
RBC: 4.25 MIL/uL (ref 3.87–5.11)
RDW: 16.5 % — ABNORMAL HIGH (ref 11.5–15.5)
WBC: 4.3 10*3/uL (ref 4.0–10.5)
nRBC: 0 % (ref 0.0–0.2)

## 2023-10-12 LAB — GLUCOSE, CAPILLARY
Glucose-Capillary: 123 mg/dL — ABNORMAL HIGH (ref 70–99)
Glucose-Capillary: 234 mg/dL — ABNORMAL HIGH (ref 70–99)

## 2023-10-12 MED ORDER — ONDANSETRON HCL 4 MG/2ML IJ SOLN: 4.0000 mg | Freq: Four times a day (QID) | INTRAMUSCULAR | Status: DC | PRN

## 2023-10-12 NOTE — Discharge Planning (Signed)
[facility] Patient Discharge Orders- [facility] CLINIC: [facility]  Patient's name: Angela [name] Admit/DC Dates: 10/09/2023 - 10/12/2023  Discharge Diagnoses: Pulmonary edema/volume overload ? Missed HD    Hyperkalemia ( missed HD ESRD on hd  Aranesp: Given: no   Date and amount of last dose: none  Last Hgb: 11.1 PRBC's Given: none Date/# of units: 0 ESA dose for discharge: mircera 0 mcg IV q 2 weeks  IV Iron dose at discharge: none  Heparin change: no  EDW Change: no  New EDW:   [place] Change: no  Access intervention/Change: no Details:  Hectorol/Calcitriol change: n0  Discharge Labs: Calcium 9.3 Phosphorus 0 Albumin 3.6 K+ 5.0  IV Antibiotics: no Details:  On Coumadin?: no Last INR: Next INR: Managed By:   OTHER/APPTS/LAB ORDERS:    D/C Meds to be reconciled by nurse after every discharge.  Completed By:   Reviewed by: MD:______ RN_______

## 2023-10-12 NOTE — Progress Notes (Signed)
D/C order noted. Contacted [facility] to advise clinic of pt's d/c today and that pt should resume care tomorrow.   Leydy Worthey Renal Navigator [phone] 

## 2023-10-12 NOTE — Discharge Summary (Signed)
Physician Discharge Summary   Patient: Angela [name] MRN: [id number] DOB: 06/19/1986  Admit date:     10/09/2023  Discharge date: 10/12/23  Discharge Physician: Jonah Blue   PCP: Marcine Matar, MD   Recommendations at discharge:   Do not miss dialysis - even if you are not feeling well Follow up at HD tomorrow (10/25) Follow up with Dr. Laural Benes in 1-2 weeks  Discharge Diagnoses: Principal Problem:   Acute hyperkalemia Active Problems:   Non compliance with medical treatment   ESRD on dialysis [facility])   Acute respiratory failure with hypoxia (HCC)   Uncontrolled type 1 diabetes mellitus with hypoglycemia, with long-term current use of insulin (HCC)   HTN (hypertension)   Mixed hyperlipidemia   Seizure (HCC)   History of cerebral hemorrhage    Hospital Course: 37 y.o. 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  She underwent HD on 10/22 and 10/23 and is likely appropriate for dc back to MWF HD today.   Assessment and Plan:  Volume overload in an ESRD on HD patient Patient presented with SOB, noted to have pulmonary edema on imaging Suspect this is less related to heart failure and more related to HD noncompliance leading to volume overload Since she is dialysis-dependent, she was unable to clear the excess fluid She has undergone serial HD and is feeling better, now appropriate for d/c to home Certainly, she needs fluid restriction and very low salt intake on an ongoing basis   Acute hyperkalemia Admitted for acute hyperkalemia with hyperpeaked T-waves on EKG Pt had emergent HD on 10-10-2023 and repeat HD on 10/23 K+ improved Continue daily po lokelma   Acute respiratory failure with hypoxia  10-11-2023 - Noted to have RA sats of 83% on 10-10-2023 Placed on supplemental O2, does not use home O2 This is thought to be due to  pulmonary edema from volume overload due to missed HD sessions/non-compliance with medical therapy She is no longer requiring O2 and does not report SOB today   ESRD on dialysis (HCC) 10-11-2023 on HD on M, W, F Has been missing HD sessions HD compliance encouraged Continue Aranesp and Calcitriol at HD Continue home Renvela and Sensipar   History of cerebral hemorrhage Chronic   Seizures Continue with keppra and neurontin   Mixed hyperlipidemia Stable   HTN (hypertension) Continue with hydralazine, norvasc, cardizem and coreg Consider dc on one of the CCB medication as an outpatient   Type 1 diabetes mellitus with hypoglycemia, with long-term current use of insulin (HCC) A1c 6.6, indicating good control Continue Tresiba and SSI  Mood d/o Continue buspirone, escitalopram  Hypothyroidism Continue Synthroid    Consultants: Nephrology  Procedures: HD 10/22, 10/23  Antibiotics: None  30 Day Unplanned Readmission Risk Score    Flowsheet Row ED to Hosp-Admission (Current) from 10/09/2023 in [facility]  30 Day Unplanned Readmission Risk Score (%) 42.44 Filed at 10/12/2023 0400       This score is the patient's risk of an unplanned readmission within 30 days of being discharged (0 -100%). The score is based on dignosis, age, lab data, medications, orders, and past utilization.   Low:  0-14.9   Medium: 15-21.9   High: 22-29.9   Extreme: 30 and above           Pain control - [facility] Controlled  Physician Discharge Summary   Patient: Angela [name] MRN: [id number] DOB: 06/19/1986  Admit date:     10/09/2023  Discharge date: 10/12/23  Discharge Physician: Jonah Blue   PCP: Marcine Matar, MD   Recommendations at discharge:   Do not miss dialysis - even if you are not feeling well Follow up at HD tomorrow (10/25) Follow up with Dr. Laural Benes in 1-2 weeks  Discharge Diagnoses: Principal Problem:   Acute hyperkalemia Active Problems:   Non compliance with medical treatment   ESRD on dialysis [facility])   Acute respiratory failure with hypoxia (HCC)   Uncontrolled type 1 diabetes mellitus with hypoglycemia, with long-term current use of insulin (HCC)   HTN (hypertension)   Mixed hyperlipidemia   Seizure (HCC)   History of cerebral hemorrhage    Hospital Course: 37 y.o. 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  She underwent HD on 10/22 and 10/23 and is likely appropriate for dc back to MWF HD today.   Assessment and Plan:  Volume overload in an ESRD on HD patient Patient presented with SOB, noted to have pulmonary edema on imaging Suspect this is less related to heart failure and more related to HD noncompliance leading to volume overload Since she is dialysis-dependent, she was unable to clear the excess fluid She has undergone serial HD and is feeling better, now appropriate for d/c to home Certainly, she needs fluid restriction and very low salt intake on an ongoing basis   Acute hyperkalemia Admitted for acute hyperkalemia with hyperpeaked T-waves on EKG Pt had emergent HD on 10-10-2023 and repeat HD on 10/23 K+ improved Continue daily po lokelma   Acute respiratory failure with hypoxia  10-11-2023 - Noted to have RA sats of 83% on 10-10-2023 Placed on supplemental O2, does not use home O2 This is thought to be due to  pulmonary edema from volume overload due to missed HD sessions/non-compliance with medical therapy She is no longer requiring O2 and does not report SOB today   ESRD on dialysis (HCC) 10-11-2023 on HD on M, W, F Has been missing HD sessions HD compliance encouraged Continue Aranesp and Calcitriol at HD Continue home Renvela and Sensipar   History of cerebral hemorrhage Chronic   Seizures Continue with keppra and neurontin   Mixed hyperlipidemia Stable   HTN (hypertension) Continue with hydralazine, norvasc, cardizem and coreg Consider dc on one of the CCB medication as an outpatient   Type 1 diabetes mellitus with hypoglycemia, with long-term current use of insulin (HCC) A1c 6.6, indicating good control Continue Tresiba and SSI  Mood d/o Continue buspirone, escitalopram  Hypothyroidism Continue Synthroid    Consultants: Nephrology  Procedures: HD 10/22, 10/23  Antibiotics: None  30 Day Unplanned Readmission Risk Score    Flowsheet Row ED to Hosp-Admission (Current) from 10/09/2023 in [facility]  30 Day Unplanned Readmission Risk Score (%) 42.44 Filed at 10/12/2023 0400       This score is the patient's risk of an unplanned readmission within 30 days of being discharged (0 -100%). The score is based on dignosis, age, lab data, medications, orders, and past utilization.   Low:  0-14.9   Medium: 15-21.9   High: 22-29.9   Extreme: 30 and above           Pain control - [facility] Controlled  Feeling better, not SOB, wants to go home.  Reports missing HD only occasionally, was feeling "sick" and so missed this time.  Will work on compliance.   Objective: Vitals:   10/12/23 1301 10/12/23 1303  BP: (!) 164/85   Pulse: 83   Resp: 16   Temp: 98.9 F (37.2 C)   SpO2: 92% 98%    Intake/Output Summary (Last 24 hours) at 10/12/2023 1313 Last data filed at 10/11/2023 2238 Gross per 24 hour  Intake 240 ml  Output 3000 ml  Net -2760 ml   Filed Weights   10/09/23 2236 10/11/23 1739 10/11/23 2041  Weight: 63.5 kg 67 kg 64 kg    Exam:  General:  Appears calm and comfortable and is in NAD Eyes:  EOMI, normal lids, iris ENT:  grossly normal hearing, lips & tongue, mmm Neck:  no LAD, masses or thyromegaly Cardiovascular:  RRR, no m/r/g. No LE edema.  Respiratory:   CTA bilaterally with no wheezes/rales/rhonchi.  Normal respiratory effort. Abdomen:  soft, NT, ND Skin:  no rash or induration seen on  limited exam Musculoskeletal:  grossly normal tone BUE/BLE, good ROM, no bony abnormality Psychiatric:  blunted mood and affect, speech fluent and appropriate, AOx3 Neurologic:  CN 2-12 grossly intact, moves all extremities in coordinated fashion, sensation intact  Data Reviewed: I have reviewed the patient's lab results since admission.  Pertinent labs for today include:   Na++ 132, improved K+ 5.0 BUN 26/Creatinine 5.57/GFR 9 Stable CBC    Condition at discharge: improving  The results of significant diagnostics from this hospitalization (including imaging, microbiology, ancillary and laboratory) are listed below for reference.   Imaging Studies: DG Chest 2 View  Result Date: 10/09/2023 CLINICAL DATA:  Chest pain. EXAM: CHEST - 2 VIEW COMPARISON:  Radiograph and CT 08/07/2023 FINDINGS: Chronic cardiomegaly, unchanged mediastinal contours. Increased pulmonary edema from prior exam. Small pleural effusions with fluid in the fissures. No pneumothorax. IMPRESSION: Cardiomegaly with increased pulmonary edema and small pleural effusions. Electronically Signed   By: Narda Rutherford M.D.   On: 10/09/2023 23:38   NCV with EMG(electromyography)  Result Date: 09/19/2023 Anson Fret, MD     09/22/2023  2:37 PM   Full Name: Angela [name] Gender: Female MRN #: [id number] Date of Birth: 02-Mar-1986   Visit Date: 09/19/2023 09:36 Age: 70 Years Examining Physician: Dr. Naomie Dean Referring Physician: Dr. Naomie Dean Height: 5 feet 4 inch   History: Pain the elbows radiating to digits 4/5.  Also  is having a lot of neck pain, shooting down the arm, digits 4-5, + tinel's sign at right elbow. Wakes up middle of the night with numbness in the right hand. Neck pain radiating into the right arm digits 4-5. MRI cervical spine 08/31/2023: normal. 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 Right is worse per patient. Fistula is on the left. Start with right arm then and avoid the left proximally. Summary: NCS and  EMG limited due to fistula. Right is worse per patient. Fistula is on the left.and had to avoid any testing on the left proximally. NCS was performed on the bilateral upper extremities: The right ulnar ADM motor nerve showed prolonged onset latency (5.1 ms, normal less than 3.3) and reduced amplitude (0.1 mV, normal greater than 6).  The left ulnar ADM motor nerve showed no response.  The right ulnar FDI motor nerve showed prolonged onset latency (5.1 ms, normal less than 4.5) and reduced amplitude (1.4 mV, normal greater than 7) and conduction  Feeling better, not SOB, wants to go home.  Reports missing HD only occasionally, was feeling "sick" and so missed this time.  Will work on compliance.   Objective: Vitals:   10/12/23 1301 10/12/23 1303  BP: (!) 164/85   Pulse: 83   Resp: 16   Temp: 98.9 F (37.2 C)   SpO2: 92% 98%    Intake/Output Summary (Last 24 hours) at 10/12/2023 1313 Last data filed at 10/11/2023 2238 Gross per 24 hour  Intake 240 ml  Output 3000 ml  Net -2760 ml   Filed Weights   10/09/23 2236 10/11/23 1739 10/11/23 2041  Weight: 63.5 kg 67 kg 64 kg    Exam:  General:  Appears calm and comfortable and is in NAD Eyes:  EOMI, normal lids, iris ENT:  grossly normal hearing, lips & tongue, mmm Neck:  no LAD, masses or thyromegaly Cardiovascular:  RRR, no m/r/g. No LE edema.  Respiratory:   CTA bilaterally with no wheezes/rales/rhonchi.  Normal respiratory effort. Abdomen:  soft, NT, ND Skin:  no rash or induration seen on  limited exam Musculoskeletal:  grossly normal tone BUE/BLE, good ROM, no bony abnormality Psychiatric:  blunted mood and affect, speech fluent and appropriate, AOx3 Neurologic:  CN 2-12 grossly intact, moves all extremities in coordinated fashion, sensation intact  Data Reviewed: I have reviewed the patient's lab results since admission.  Pertinent labs for today include:   Na++ 132, improved K+ 5.0 BUN 26/Creatinine 5.57/GFR 9 Stable CBC    Condition at discharge: improving  The results of significant diagnostics from this hospitalization (including imaging, microbiology, ancillary and laboratory) are listed below for reference.   Imaging Studies: DG Chest 2 View  Result Date: 10/09/2023 CLINICAL DATA:  Chest pain. EXAM: CHEST - 2 VIEW COMPARISON:  Radiograph and CT 08/07/2023 FINDINGS: Chronic cardiomegaly, unchanged mediastinal contours. Increased pulmonary edema from prior exam. Small pleural effusions with fluid in the fissures. No pneumothorax. IMPRESSION: Cardiomegaly with increased pulmonary edema and small pleural effusions. Electronically Signed   By: Narda Rutherford M.D.   On: 10/09/2023 23:38   NCV with EMG(electromyography)  Result Date: 09/19/2023 Anson Fret, MD     09/22/2023  2:37 PM   Full Name: Angela [name] Gender: Female MRN #: [id number] Date of Birth: 02-Mar-1986   Visit Date: 09/19/2023 09:36 Age: 70 Years Examining Physician: Dr. Naomie Dean Referring Physician: Dr. Naomie Dean Height: 5 feet 4 inch   History: Pain the elbows radiating to digits 4/5.  Also  is having a lot of neck pain, shooting down the arm, digits 4-5, + tinel's sign at right elbow. Wakes up middle of the night with numbness in the right hand. Neck pain radiating into the right arm digits 4-5. MRI cervical spine 08/31/2023: normal. 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 Right is worse per patient. Fistula is on the left. Start with right arm then and avoid the left proximally. Summary: NCS and  EMG limited due to fistula. Right is worse per patient. Fistula is on the left.and had to avoid any testing on the left proximally. NCS was performed on the bilateral upper extremities: The right ulnar ADM motor nerve showed prolonged onset latency (5.1 ms, normal less than 3.3) and reduced amplitude (0.1 mV, normal greater than 6).  The left ulnar ADM motor nerve showed no response.  The right ulnar FDI motor nerve showed prolonged onset latency (5.1 ms, normal less than 4.5) and reduced amplitude (1.4 mV, normal greater than 7) and conduction  Feeling better, not SOB, wants to go home.  Reports missing HD only occasionally, was feeling "sick" and so missed this time.  Will work on compliance.   Objective: Vitals:   10/12/23 1301 10/12/23 1303  BP: (!) 164/85   Pulse: 83   Resp: 16   Temp: 98.9 F (37.2 C)   SpO2: 92% 98%    Intake/Output Summary (Last 24 hours) at 10/12/2023 1313 Last data filed at 10/11/2023 2238 Gross per 24 hour  Intake 240 ml  Output 3000 ml  Net -2760 ml   Filed Weights   10/09/23 2236 10/11/23 1739 10/11/23 2041  Weight: 63.5 kg 67 kg 64 kg    Exam:  General:  Appears calm and comfortable and is in NAD Eyes:  EOMI, normal lids, iris ENT:  grossly normal hearing, lips & tongue, mmm Neck:  no LAD, masses or thyromegaly Cardiovascular:  RRR, no m/r/g. No LE edema.  Respiratory:   CTA bilaterally with no wheezes/rales/rhonchi.  Normal respiratory effort. Abdomen:  soft, NT, ND Skin:  no rash or induration seen on  limited exam Musculoskeletal:  grossly normal tone BUE/BLE, good ROM, no bony abnormality Psychiatric:  blunted mood and affect, speech fluent and appropriate, AOx3 Neurologic:  CN 2-12 grossly intact, moves all extremities in coordinated fashion, sensation intact  Data Reviewed: I have reviewed the patient's lab results since admission.  Pertinent labs for today include:   Na++ 132, improved K+ 5.0 BUN 26/Creatinine 5.57/GFR 9 Stable CBC    Condition at discharge: improving  The results of significant diagnostics from this hospitalization (including imaging, microbiology, ancillary and laboratory) are listed below for reference.   Imaging Studies: DG Chest 2 View  Result Date: 10/09/2023 CLINICAL DATA:  Chest pain. EXAM: CHEST - 2 VIEW COMPARISON:  Radiograph and CT 08/07/2023 FINDINGS: Chronic cardiomegaly, unchanged mediastinal contours. Increased pulmonary edema from prior exam. Small pleural effusions with fluid in the fissures. No pneumothorax. IMPRESSION: Cardiomegaly with increased pulmonary edema and small pleural effusions. Electronically Signed   By: Narda Rutherford M.D.   On: 10/09/2023 23:38   NCV with EMG(electromyography)  Result Date: 09/19/2023 Anson Fret, MD     09/22/2023  2:37 PM   Full Name: Angela [name] Gender: Female MRN #: [id number] Date of Birth: 02-Mar-1986   Visit Date: 09/19/2023 09:36 Age: 70 Years Examining Physician: Dr. Naomie Dean Referring Physician: Dr. Naomie Dean Height: 5 feet 4 inch   History: Pain the elbows radiating to digits 4/5.  Also  is having a lot of neck pain, shooting down the arm, digits 4-5, + tinel's sign at right elbow. Wakes up middle of the night with numbness in the right hand. Neck pain radiating into the right arm digits 4-5. MRI cervical spine 08/31/2023: normal. 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 Right is worse per patient. Fistula is on the left. Start with right arm then and avoid the left proximally. Summary: NCS and  EMG limited due to fistula. Right is worse per patient. Fistula is on the left.and had to avoid any testing on the left proximally. NCS was performed on the bilateral upper extremities: The right ulnar ADM motor nerve showed prolonged onset latency (5.1 ms, normal less than 3.3) and reduced amplitude (0.1 mV, normal greater than 6).  The left ulnar ADM motor nerve showed no response.  The right ulnar FDI motor nerve showed prolonged onset latency (5.1 ms, normal less than 4.5) and reduced amplitude (1.4 mV, normal greater than 7) and conduction  Feeling better, not SOB, wants to go home.  Reports missing HD only occasionally, was feeling "sick" and so missed this time.  Will work on compliance.   Objective: Vitals:   10/12/23 1301 10/12/23 1303  BP: (!) 164/85   Pulse: 83   Resp: 16   Temp: 98.9 F (37.2 C)   SpO2: 92% 98%    Intake/Output Summary (Last 24 hours) at 10/12/2023 1313 Last data filed at 10/11/2023 2238 Gross per 24 hour  Intake 240 ml  Output 3000 ml  Net -2760 ml   Filed Weights   10/09/23 2236 10/11/23 1739 10/11/23 2041  Weight: 63.5 kg 67 kg 64 kg    Exam:  General:  Appears calm and comfortable and is in NAD Eyes:  EOMI, normal lids, iris ENT:  grossly normal hearing, lips & tongue, mmm Neck:  no LAD, masses or thyromegaly Cardiovascular:  RRR, no m/r/g. No LE edema.  Respiratory:   CTA bilaterally with no wheezes/rales/rhonchi.  Normal respiratory effort. Abdomen:  soft, NT, ND Skin:  no rash or induration seen on  limited exam Musculoskeletal:  grossly normal tone BUE/BLE, good ROM, no bony abnormality Psychiatric:  blunted mood and affect, speech fluent and appropriate, AOx3 Neurologic:  CN 2-12 grossly intact, moves all extremities in coordinated fashion, sensation intact  Data Reviewed: I have reviewed the patient's lab results since admission.  Pertinent labs for today include:   Na++ 132, improved K+ 5.0 BUN 26/Creatinine 5.57/GFR 9 Stable CBC    Condition at discharge: improving  The results of significant diagnostics from this hospitalization (including imaging, microbiology, ancillary and laboratory) are listed below for reference.   Imaging Studies: DG Chest 2 View  Result Date: 10/09/2023 CLINICAL DATA:  Chest pain. EXAM: CHEST - 2 VIEW COMPARISON:  Radiograph and CT 08/07/2023 FINDINGS: Chronic cardiomegaly, unchanged mediastinal contours. Increased pulmonary edema from prior exam. Small pleural effusions with fluid in the fissures. No pneumothorax. IMPRESSION: Cardiomegaly with increased pulmonary edema and small pleural effusions. Electronically Signed   By: Narda Rutherford M.D.   On: 10/09/2023 23:38   NCV with EMG(electromyography)  Result Date: 09/19/2023 Anson Fret, MD     09/22/2023  2:37 PM   Full Name: Angela [name] Gender: Female MRN #: [id number] Date of Birth: 02-Mar-1986   Visit Date: 09/19/2023 09:36 Age: 70 Years Examining Physician: Dr. Naomie Dean Referring Physician: Dr. Naomie Dean Height: 5 feet 4 inch   History: Pain the elbows radiating to digits 4/5.  Also  is having a lot of neck pain, shooting down the arm, digits 4-5, + tinel's sign at right elbow. Wakes up middle of the night with numbness in the right hand. Neck pain radiating into the right arm digits 4-5. MRI cervical spine 08/31/2023: normal. 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 Right is worse per patient. Fistula is on the left. Start with right arm then and avoid the left proximally. Summary: NCS and  EMG limited due to fistula. Right is worse per patient. Fistula is on the left.and had to avoid any testing on the left proximally. NCS was performed on the bilateral upper extremities: The right ulnar ADM motor nerve showed prolonged onset latency (5.1 ms, normal less than 3.3) and reduced amplitude (0.1 mV, normal greater than 6).  The left ulnar ADM motor nerve showed no response.  The right ulnar FDI motor nerve showed prolonged onset latency (5.1 ms, normal less than 4.5) and reduced amplitude (1.4 mV, normal greater than 7) and conduction

## 2023-10-12 NOTE — Plan of Care (Signed)
  Problem: Education: Goal: Knowledge of disease or condition will improve Outcome: Progressing Goal: Knowledge of secondary prevention will improve (MUST DOCUMENT ALL) Outcome: Progressing Goal: Knowledge of patient specific risk factors will improve [name] N/A or DELETE if not current risk factor) Outcome: Progressing   Problem: Intracerebral Hemorrhage Tissue Perfusion: Goal: Complications of Intracerebral Hemorrhage will be minimized Outcome: Progressing   Problem: Coping: Goal: Will verbalize positive feelings about self Outcome: Progressing Goal: Will identify appropriate support needs Outcome: Progressing   Problem: Health Behavior/Discharge Planning: Goal: Ability to manage health-related needs will improve Outcome: Progressing Goal: Goals will be collaboratively established with patient/family Outcome: Progressing   Problem: Self-Care: Goal: Ability to participate in self-care as condition permits will improve Outcome: Progressing Goal: Verbalization of feelings and concerns over difficulty with self-care will improve Outcome: Progressing Goal: Ability to communicate needs accurately will improve Outcome: Progressing   Problem: Nutrition: Goal: Risk of aspiration will decrease Outcome: Progressing Goal: Dietary intake will improve Outcome: Progressing   Problem: Education: Goal: Ability to describe self-care measures that may prevent or decrease complications (Diabetes Survival Skills Education) will improve Outcome: Progressing Goal: Individualized Educational Video(s) Outcome: Progressing   Problem: Coping: Goal: Ability to adjust to condition or change in health will improve Outcome: Progressing   Problem: Fluid Volume: Goal: Ability to maintain a balanced intake and output will improve Outcome: Progressing   Problem: Health Behavior/Discharge Planning: Goal: Ability to identify and utilize available resources and services will improve Outcome:  Progressing Goal: Ability to manage health-related needs will improve Outcome: Progressing   Problem: Metabolic: Goal: Ability to maintain appropriate glucose levels will improve Outcome: Progressing   Problem: Nutritional: Goal: Maintenance of adequate nutrition will improve Outcome: Progressing Goal: Progress toward achieving an optimal weight will improve Outcome: Progressing   Problem: Skin Integrity: Goal: Risk for impaired skin integrity will decrease Outcome: Progressing   Problem: Tissue Perfusion: Goal: Adequacy of tissue perfusion will improve Outcome: Progressing   Problem: Education: Goal: Knowledge of General Education information will improve Description: Including pain rating scale, medication(s)/side effects and non-pharmacologic comfort measures Outcome: Progressing   Problem: Health Behavior/Discharge Planning: Goal: Ability to manage health-related needs will improve Outcome: Progressing   Problem: Clinical Measurements: Goal: Ability to maintain clinical measurements within normal limits will improve Outcome: Progressing Goal: Will remain free from infection Outcome: Progressing Goal: Diagnostic test results will improve Outcome: Progressing Goal: Respiratory complications will improve Outcome: Progressing Goal: Cardiovascular complication will be avoided Outcome: Progressing   Problem: Activity: Goal: Risk for activity intolerance will decrease Outcome: Progressing   Problem: Nutrition: Goal: Adequate nutrition will be maintained Outcome: Progressing   Problem: Coping: Goal: Level of anxiety will decrease Outcome: Progressing   Problem: Elimination: Goal: Will not experience complications related to bowel motility Outcome: Progressing Goal: Will not experience complications related to urinary retention Outcome: Progressing   Problem: Pain Management: Goal: General experience of comfort will improve Outcome: Progressing   Problem:  Safety: Goal: Ability to remain free from injury will improve Outcome: Progressing   Problem: Skin Integrity: Goal: Risk for impaired skin integrity will decrease Outcome: Progressing

## 2023-10-12 NOTE — Progress Notes (Signed)
Subjective: patient seen and examined bedside. No acute events. She reports that she tolerated hd yesterday, net uf 3L. She reports she feels nauseated this am, no vomiting. Denies any cp, sob.  Objective Vital signs in last 24 hours: Vitals:   10/11/23 2133 10/12/23 0016 10/12/23 0547 10/12/23 0738  BP: (!) 162/94 (!) 156/85 (!) 147/86 (!) 149/73  Pulse: 92 93 87 87  Resp: 16     Temp: 98.8 F (37.1 C) 98.7 F (37.1 C)    TempSrc: Oral Oral    SpO2: 93% 93% 92% 92%  Weight:      Height:       Weight change:   Physical Exam: General: Alert, appropriate female NAD, laying flat in bed Heart: RRR no MRG Lungs: cta bl, nonlabored breathing Abdomen: NABS, soft NTND Extremities: No pedal edema Dialysis Access: LUA AV fistula positive bruit  OP HD orders: [place]. MWF. 3hrs . EDW 59.7kg. 2k/2cal. Flow rates: 400/autoflow 1.5. AVF15g. No heparin. Meds: Venofer 100mg  until 11/01/23; calcitriol q hd    Problem/Plan:  Pulm edema -uf as tolerated. No SOB this AM, 92% on RA this am   Hyperkalemia -AM labs pending  ESRD -HD per MWF schedule  Volume/ hypertension  -UF as tolerated, resume home BP meds   Anemia of Chronic Kidney Disease Hemoglobin 11.1 no indication for ESA/Fe at this time.  -Transfuse PRN for Hgb <7   Secondary Hyperparathyroidism/Hyperphosphatemia -Admit calcium elevated improving now resume home binders if on any, will resume calcitriol. Check PO4   Anthony Sar, MD [facility]  Labs: Basic Metabolic Panel: Recent Labs  Lab 10/09/23 2306 10/10/23 0208 10/10/23 1118 10/11/23 0442  NA 127*  122* 122* 131* 131*  K >7.5*  8.3* 7.3* 5.1 6.2*  CL 87*  91*  --  92* 90*  CO2 22  --  26 28  GLUCOSE 231*  231*  --  170* 110*  BUN 69*  60*  --  32* 39*  CREATININE 8.35*  9.60*  --  5.59* 7.08*  CALCIUM 10.4*  --  9.7 9.1   Liver Function Tests: Recent Labs  Lab 10/11/23 0442  AST 37  ALT 28  ALKPHOS 150*  BILITOT  0.8  PROT 6.3*  ALBUMIN 3.6   No results for input(s): "LIPASE", "AMYLASE" in the last 168 hours. No results for input(s): "AMMONIA" in the last 168 hours. CBC: Recent Labs  Lab 10/09/23 2306 10/10/23 0208 10/10/23 1118 10/11/23 0442  WBC 7.5  --  6.4 5.2  HGB 13.0  15.6* 14.6 11.1* 11.1*  HCT 41.1  46.0 43.0 36.4 35.5*  MCV 84.9  --  87.5 84.9  PLT 125*  --  104* 109*   Cardiac Enzymes: No results for input(s): "CKTOTAL", "CKMB", "CKMBINDEX", "TROPONINI" in the last 168 hours. CBG: Recent Labs  Lab 10/11/23 0548 10/11/23 0932 10/11/23 1132 10/11/23 2148 10/12/23 0738  GLUCAP 88 106* 86 110* 123*    Studies/Results: No results found. Medications:   amLODipine  10 mg Oral QHS   busPIRone  15 mg Oral QHS   calcitRIOL  1 mcg Oral Q M,W,F-HD   carvedilol  6.25 mg Oral BID WC   Chlorhexidine Gluconate Cloth  6 each Topical Q0600   cinacalcet  30 mg Oral QPM   diltiazem  60 mg Oral Q6H   escitalopram  20 mg Oral Daily   gabapentin  100 mg Oral TID   heparin  5,000 Units Subcutaneous Q8H   hydrALAZINE  100 mg Oral TID   insulin aspart  0-5 Units Subcutaneous QHS   insulin aspart  0-6 Units Subcutaneous TID WC   insulin glargine-yfgn  5 Units Subcutaneous QHS   levETIRAcetam  500 mg Oral BID   levothyroxine  200 mcg Oral QAC breakfast   metoCLOPramide  5 mg Oral BID WC   sevelamer carbonate  1,600 mg Oral TID WC   sodium zirconium cyclosilicate  5 g Oral Daily

## 2023-10-12 NOTE — Progress Notes (Signed)
Mobility Specialist Progress Note:   10/12/23 1034  Mobility  Activity Ambulated with assistance in hallway  Level of Assistance Contact guard assist, steadying assist  Assistive Device None  Distance Ambulated (ft) 150 ft  Activity Response Tolerated well  Mobility Referral Yes  $Mobility charge 1 Mobility  Mobility Specialist Start Time (ACUTE ONLY) 1034  Mobility Specialist Stop Time (ACUTE ONLY) 1039  Mobility Specialist Time Calculation (min) (ACUTE ONLY) 5 min   Pt received sitting EOB, agreeable to mobility session. Tolerated well, asx throughout. Returned pt to room, all needs met.   Angela [name] Mobility Specialist Please contact via Special educational needs teacher or  Rehab office at [phone]

## 2023-10-13 ENCOUNTER — Telehealth (HOSPITAL_COMMUNITY): Payer: Self-pay | Admitting: Nephrology

## 2023-10-13 NOTE — Telephone Encounter (Signed)
Transition of Care - Initial Contact from Inpatient Facility  Date of discharge: 10/12/23 Date of contact: 10/13/23  Method: Phone Spoke to: Patient  Patient contacted to discuss transition of care from recent inpatient hospitalization. Patient was admitted to [facility] from 10/21-10/24/24 with discharge diagnosis of hyperkalemia and AHRF/overload.  The discharge medication list was reviewed. Patient understands the changes and has no concerns.   Patient will return to his/her outpatient HD unit on: Today - she is there now.  No other concerns at this time. Says breathing is stable/better.  Ozzie Hoyle, PA-C [employer] Pager [phone]

## 2023-10-16 ENCOUNTER — Telehealth: Payer: Self-pay

## 2023-10-16 NOTE — Transitions of Care (Post Inpatient/ED Visit) (Signed)
10/16/2023  Name: Darliene Cucinotta MRN: [id number] DOB: Feb 16, 1986  Today's TOC FU Call Status: Today's TOC FU Call Status:: Unsuccessful Call (1st Attempt) Unsuccessful Call (1st Attempt) Date: 10/16/23  Attempted to reach the patient regarding the most recent Inpatient/ED visit.  Follow Up Plan: Additional outreach attempts will be made to reach the patient to complete the Transitions of Care (Post Inpatient/ED visit) call.   Alyse Low, RN, BA, [facility], CRRN [facility] Coordinator, Transition of Care Ph # [phone]

## 2023-10-16 NOTE — Transitions of Care (Post Inpatient/ED Visit) (Signed)
10/16/2023  Name: Angela [name] MRN: [id number] DOB: 02-20-1986  Today's TOC FU Call Status: Today's TOC FU Call Status:: Unsuccessful Call (1st Attempt) Unsuccessful Call (1st Attempt) Date: 10/16/23  Attempted to reach the patient regarding the most recent Inpatient/ED visit.  Follow Up Plan: Additional outreach attempts will be made to reach the patient to complete the Transitions of Care (Post Inpatient/ED visit) call.   Signature Robyne Peers, RN

## 2023-10-17 ENCOUNTER — Telehealth: Payer: Self-pay

## 2023-10-17 ENCOUNTER — Encounter: Payer: Self-pay | Admitting: Internal Medicine

## 2023-10-17 ENCOUNTER — Other Ambulatory Visit (INDEPENDENT_AMBULATORY_CARE_PROVIDER_SITE_OTHER): Payer: MEDICAID

## 2023-10-17 ENCOUNTER — Encounter: Payer: Self-pay | Admitting: Gastroenterology

## 2023-10-17 ENCOUNTER — Ambulatory Visit (INDEPENDENT_AMBULATORY_CARE_PROVIDER_SITE_OTHER): Payer: MEDICAID | Admitting: Gastroenterology

## 2023-10-17 VITALS — BP 126/70 | HR 71 | Ht 64.0 in | Wt 134.0 lb

## 2023-10-17 DIAGNOSIS — R7989 Other specified abnormal findings of blood chemistry: Secondary | ICD-10-CM | POA: Diagnosis not present

## 2023-10-17 DIAGNOSIS — K7460 Unspecified cirrhosis of liver: Secondary | ICD-10-CM

## 2023-10-17 LAB — COMPREHENSIVE METABOLIC PANEL
ALT: 13 U/L (ref 0–35)
AST: 17 U/L (ref 0–37)
Albumin: 4.7 g/dL (ref 3.5–5.2)
Alkaline Phosphatase: 162 U/L — ABNORMAL HIGH (ref 39–117)
BUN: 28 mg/dL — ABNORMAL HIGH (ref 6–23)
CO2: 33 meq/L — ABNORMAL HIGH (ref 19–32)
Calcium: 10.2 mg/dL (ref 8.4–10.5)
Chloride: 91 meq/L — ABNORMAL LOW (ref 96–112)
Creatinine, Ser: 5.7 mg/dL (ref 0.40–1.20)
GFR: 8.85 mL/min — CL (ref >60.00)
Glucose, Bld: 109 mg/dL — ABNORMAL HIGH (ref 70–99)
Potassium: 4.4 meq/L (ref 3.5–5.1)
Sodium: 137 meq/L (ref 135–145)
Total Bilirubin: 0.5 mg/dL (ref 0.2–1.2)
Total Protein: 7.7 g/dL (ref 6.0–8.3)

## 2023-10-17 LAB — CBC
HCT: 39.5 % (ref 36.0–46.0)
Hemoglobin: 12.2 g/dL (ref 12.0–15.0)
MCHC: 30.9 g/dL (ref 30.0–36.0)
MCV: 85.3 fL (ref 78.0–100.0)
Platelets: 197 10*3/uL (ref 150.0–400.0)
RBC: 4.62 Mil/uL (ref 3.87–5.11)
RDW: 17.8 % — ABNORMAL HIGH (ref 11.5–15.5)
WBC: 5.9 10*3/uL (ref 4.0–10.5)

## 2023-10-17 LAB — PROTIME-INR
INR: 1 {ratio} (ref 0.8–1.0)
Prothrombin Time: 11.1 s (ref 9.6–13.1)

## 2023-10-17 MED ORDER — PANTOPRAZOLE SODIUM 40 MG PO TBEC: 40.0000 mg | DELAYED_RELEASE_TABLET | Freq: Every day | ORAL | 1 refills | Status: DC

## 2023-10-17 NOTE — Telephone Encounter (Signed)
Noted  

## 2023-10-17 NOTE — Telephone Encounter (Signed)
Patient is requesting a refill of protonix

## 2023-10-17 NOTE — Telephone Encounter (Signed)
Expected.  Patient with end-stage renal disease on HD RG
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
## 2023-10-17 NOTE — Telephone Encounter (Signed)
Yes ma'am, refill sent!  ?

## 2023-10-17 NOTE — Patient Instructions (Signed)
Your provider has requested that you go to the basement level for lab work before leaving today. Press "B" on the elevator. The lab is located at the first door on the left as you exit the elevator.  Continue taking Protonix   If your blood pressure at your visit was 140/90 or greater, please contact your primary care physician to follow up on this.  _______________________________________________________  If you are age 38 or older, your body mass index should be between 23-30. Your Body mass index is 23 kg/m. If this is out of the aforementioned range listed, please consider follow up with your Primary Care Provider.  If you are age 65 or younger, your body mass index should be between 19-25. Your Body mass index is 23 kg/m. If this is out of the aformentioned range listed, please consider follow up with your Primary Care Provider.   ________________________________________________________  The [place] [facility] providers would like to encourage you to use [facility] to communicate with providers for non-urgent requests or questions.  Due to long hold times on the telephone, sending your provider a message by [facility] may be a faster and more efficient way to get a response.  Please allow 48 business hours for a response.  Please remember that this is for non-urgent requests.  _______________________________________________________    Thank you for entrusting me with your care and choosing [facility].  Dr Chales Abrahams

## 2023-10-17 NOTE — Transitions of Care (Post Inpatient/ED Visit) (Signed)
10/17/2023  Name: Angela [name] MRN: [id number] DOB: 1986-10-01  Today's TOC FU Call Status: Today's TOC FU Call Status:: Successful TOC FU Call Completed Unsuccessful Call (1st Attempt) Date: 10/16/23 [facility] FU Call Complete Date: 10/17/23 Patient's Name and Date of Birth confirmed.  Transition Care Management Follow-up Telephone Call Date of Discharge: 10/12/23 Discharge Facility: [facility]) Type of Discharge: Inpatient Admission Primary Inpatient Discharge Diagnosis:: acute hyperkalemia How have you been since you were released from the hospital?: Better Any questions or concerns?: No  Items Reviewed: Did you receive and understand the discharge instructions provided?: Yes Medications obtained,verified, and reconciled?: Partial Review Completed Reason for Partial Mediation Review: She said she has all medications except protonix.  She also has a Dexcom G7.  She did not have any questions about the med regime. Any new allergies since your discharge?: No Dietary orders reviewed?: Yes Type of Diet Ordered:: carb modified Do you have support at home?: Yes People in Home: parent(s) Name of Support/Comfort Primary Source: her mother  Medications Reviewed Today: Medications Reviewed Today   Medications were not reviewed in this encounter     Home Care and Equipment/Supplies: Were Home Health Services Ordered?: No Any new equipment or medical supplies ordered?: No  Functional Questionnaire: Do you need assistance with bathing/showering or dressing?: No Do you need assistance with meal preparation?: No Do you need assistance with eating?: No Do you have difficulty maintaining continence: No Do you need assistance with getting out of bed/getting out of a chair/moving?: No Do you have difficulty managing or taking your medications?: No  Follow up appointments reviewed: PCP Follow-up appointment confirmed?: Yes Date of PCP follow-up appointment?: 10/31/23 Follow-up  Provider: [facility] Follow-up appointment confirmed?: Yes Date of Specialist follow-up appointment?: 10/17/23 Follow-Up Specialty Provider:: GI- she just completed this appointment. She attends HD: M/W/F Do you need transportation to your follow-up appointment?: No Do you understand care options if your condition(s) worsen?: Yes-patient verbalized understanding    SIGNATURE Robyne Peers, RN

## 2023-10-17 NOTE — Telephone Encounter (Signed)
I called the patient and informed her that the prescription for her protonix was sent to her pharmacy.

## 2023-10-17 NOTE — Telephone Encounter (Signed)
Report was called from Lab with Critical value for pt creatinine at 5.70 and GFR at 8.85

## 2023-10-18 NOTE — Telephone Encounter (Signed)
PT returning call. She was advised that she needed to call nurse back today.

## 2023-10-18 NOTE — Telephone Encounter (Signed)
Spoke to pt. Documented in result notes.  Pt verbalized understanding with all questions answered.

## 2023-10-19 LAB — AFP TUMOR MARKER: AFP-Tumor Marker: 2.4 ng/mL

## 2023-10-31 ENCOUNTER — Encounter: Payer: Self-pay | Admitting: Internal Medicine

## 2023-10-31 ENCOUNTER — Ambulatory Visit: Payer: MEDICAID | Attending: Internal Medicine | Admitting: Internal Medicine

## 2023-10-31 VITALS — BP 140/80 | HR 76 | Temp 97.9°F | Ht 64.0 in | Wt 137.0 lb

## 2023-10-31 DIAGNOSIS — F320 Major depressive disorder, single episode, mild: Secondary | ICD-10-CM | POA: Diagnosis not present

## 2023-10-31 DIAGNOSIS — Z992 Dependence on renal dialysis: Secondary | ICD-10-CM

## 2023-10-31 DIAGNOSIS — N186 End stage renal disease: Secondary | ICD-10-CM | POA: Diagnosis not present

## 2023-10-31 DIAGNOSIS — Z23 Encounter for immunization: Secondary | ICD-10-CM | POA: Diagnosis not present

## 2023-10-31 DIAGNOSIS — F1290 Cannabis use, unspecified, uncomplicated: Secondary | ICD-10-CM | POA: Diagnosis not present

## 2023-10-31 DIAGNOSIS — E1029 Type 1 diabetes mellitus with other diabetic kidney complication: Secondary | ICD-10-CM | POA: Diagnosis not present

## 2023-10-31 DIAGNOSIS — Z09 Encounter for follow-up examination after completed treatment for conditions other than malignant neoplasm: Secondary | ICD-10-CM

## 2023-10-31 DIAGNOSIS — Z794 Long term (current) use of insulin: Secondary | ICD-10-CM

## 2023-10-31 MED ORDER — ARANESP (ALBUMIN FREE) 100 MCG/0.5ML IJ SOSY
PREFILLED_SYRINGE | INTRAMUSCULAR | 2 refills | Status: AC
Start: 2023-10-31 — End: ?

## 2023-10-31 NOTE — Patient Instructions (Signed)
Please call and schedule appointment with a behavioral health specialist as recommended today.  I encourage you to cut back on marijuana use.

## 2023-10-31 NOTE — Progress Notes (Signed)
Patient ID: Angela [name], female    DOB: 04/14/1986  MRN: [id number]  CC: Hospitalization Follow-up (Hospitalization f/u. Med refills. [facility] Hep A vax/Already received flu vax)   Subjective: Angela [name] is a 37 y.o. female who presents for hospital follow-up.  Her mother is with her. Her concerns today include:  Pt with hx of DM type 1 with neuropathy, gastroparesis and nephropathy, HTN, ESRD on HD, hemorrhagic stroke 01/2023 , ACD/IDA (on Aranesp inj weekly), hypothyroid, HL, migraines, thyroid nodules (12/2022 at [facility] bx of isthmus nodule and RLL path -follicular lesion of undetermined significance, neg Afirma), MDD   Patient hospitalized 10/21-24/24 with shortness of breath and hypoxia from volume overload and acute hyperkalemia.  Noted to have pulmonary edema on imaging.  patient had not gone to her dialysis session that morning.  She was dialyzed.  Potassium level normalized.  She was discharged in a stable condition.  A1c was 6.6.  She was continued on her blood pressure medications including hydralazine, Norvasc, Cardizem and carvedilol.  Today: Patient has been going to her dialysis sessions since hospital discharge. Mother reports that she has to stay on her about taking her medications.  Mother reports that patient smokes marijuana all day and it keeps her in a zombie state to the point that sometimes she slurs her words.  She states that patient is still out of it when she smokes marijuana that she sometimes spills her medications on the floor and does not realize it. I had referred her to behavioral health for some counseling and had given her the information to call and schedule appointment on last visit.  Patient tells me that her mother has not done so. Mother states that patient depends on her to do everything and would not take the initiative of trying to do some things for herself.  Mother herself states that she has schedule an appointment with a therapist to help with  her own mental wellbeing.  In regards to her diabetes, patient saw her endocrinologist since last visit with me.  She states that he is trying to get her new insulin pump but for now she is still doing injections with the Tresiba 5 units twice a day and NovoLog 2 to 3 units with meals.  She has a continuous glucose monitor with her.  Over the past 2 weeks, she has been within target range 25% of the times, high 22% of the times, very high 51% of the times and low 1% of the times.  She reports compliance with taking her blood pressure medications.  She has seen the gastroenterologist.  Diagnosed with cirrhosis.  Planned to do EGD in the early new year.  He recommends that she be started on hepatitis A vaccine series.  Patient Active Problem List   Diagnosis Date Noted   History of cerebral hemorrhage 10/11/2023   Acute hyperkalemia 10/10/2023   Steatosis (HCC) 08/22/2023   Malnutrition of moderate degree 08/10/2023   Acute respiratory failure with hypoxia (HCC) 08/07/2023   Numbness and tingling in right hand 08/01/2023   Chronic neck pain with abnormal neurologic examination 08/01/2023   Cervical radiculopathy at C8 08/01/2023   Proliferative diabetic retinopathy of both eyes with macular edema associated with type 1 diabetes mellitus (HCC) 05/11/2023   Protein calorie malnutrition (HCC) 02/15/2023   Seizure (HCC) 10/15/2021   ESRD on dialysis (HCC) 10/15/2021   Mallory-Weiss tear 11/16/2020   Chronic migraine without aura without status migrainosus, not intractable 04/09/2020   Occipital neuralgia of  right side 04/09/2020   Anxiety about health 10/29/2019   Cannabis abuse, episodic 10/29/2019   Abnormal uterine bleeding (AUB) 08/13/2019   Ovarian mass, left 11/27/2018   Moderate major depression (HCC) 11/27/2018   Secondary hyperparathyroidism (HCC) 05/01/2018   Anxiety and depression 06/23/2017   Insomnia 01/12/2016   Non compliance with medical treatment 01/05/2016   Mixed  hyperlipidemia 01/05/2016   Neurogenic bladder 02/20/2015   Diabetic peripheral neuropathy associated with type 1 diabetes mellitus (HCC) 02/20/2015   Iron deficiency 02/13/2015   Asthma 09/30/2014   Diabetic retinopathy (HCC) 09/30/2014   Atypical squamous cells of undetermined significance (ASCUS) on Papanicolaou smear of cervix 08/11/2014   Diabetic gastroparesis associated with type 1 diabetes mellitus (HCC) 08/05/2014   Anemia in chronic renal disease 08/05/2014   HTN (hypertension) 07/11/2012   GERD (gastroesophageal reflux disease) 09/26/2011   Hypothyroidism 09/14/2006   Uncontrolled type 1 diabetes mellitus with hypoglycemia, with long-term current use of insulin (HCC) 01/15/2000     Current Outpatient Medications on File Prior to Visit  Medication Sig Dispense Refill   albuterol (PROVENTIL HFA) 108 (90 Base) MCG/ACT inhaler Inhale 2 puffs into the lungs every 6 (six) hours as needed for wheezing or shortness of breath. 8 g 3   amLODipine (NORVASC) 10 MG tablet Take 1 tablet (10 mg total) by mouth at bedtime. 90 tablet 2   B Complex-C-Folic Acid (DIALYVITE TABLET) TABS Take 1 tablet by mouth daily.     busPIRone (BUSPAR) 15 MG tablet Take 1 tablet (15 mg total) by mouth at bedtime. 90 tablet 2   carvedilol (COREG) 6.25 MG tablet Take 1 tablet (6.25 mg total) by mouth 2 (two) times daily with a meal. 180 tablet 6   cinacalcet (SENSIPAR) 30 MG tablet Take 30 mg by mouth every evening.     Darbepoetin Alfa (ARANESP, ALBUMIN FREE,) 100 MCG/0.5ML SOSY injection Inject 0.5 mLs (100 mcg total) into the skin every Friday at 6 PM.Strength: 100 MCG/0.5ML 4 mL 2   diltiazem (CARDIZEM) 60 MG tablet Take 1 tablet (60 mg total) by mouth every 6 (six) hours. 120 tablet 6   escitalopram (LEXAPRO) 20 MG tablet Take 1 tablet (20 mg total) by mouth daily. 90 tablet 1   gabapentin (NEURONTIN) 100 MG capsule Take 1 capsule (100 mg total) by mouth 3 (three) times daily. 270 capsule 1   hydrALAZINE  (APRESOLINE) 100 MG tablet Take 1 tablet (100 mg total) by mouth 3 (three) times daily. 270 tablet 1   insulin aspart (NOVOLOG FLEXPEN) 100 UNIT/ML FlexPen Inject 2-3 Units into the skin 3 (three) times daily with meals. Per sliding scale 15 mL 6   levETIRAcetam (KEPPRA) 500 MG tablet Take 1 tablet (500 mg total) by mouth 2 (two) times daily. With 250mg (1/2 tab) supplement after dialysis. 198 tablet 4   levothyroxine (SYNTHROID) 200 MCG tablet Take 1 tablet (200 mcg total) by mouth daily before breakfast. Stop the 175 mcg dose 30 tablet 3   LOKELMA 5 g packet Take 1 packet by mouth daily.     metoCLOPramide (REGLAN) 5 MG tablet Take 1 tablet (5 mg total) by mouth 2 (two) times daily with a meal. (Patient taking differently: Take 5 mg by mouth in the morning.) 180 tablet 1   ondansetron (ZOFRAN-ODT) 8 MG disintegrating tablet Take 1 tablet (8 mg total) by mouth every 8 (eight) hours as needed for nausea or vomiting. 20 tablet 6   pantoprazole (PROTONIX) 40 MG tablet Take 1 tablet (40 mg total)  by mouth daily. 90 tablet 1   RENVELA 800 MG tablet Take 1,600 mg by mouth 3 (three) times daily.     TRESIBA FLEXTOUCH 100 UNIT/ML FlexTouch Pen Inject 5 Units into the skin 2 (two) times daily. 15 mL 6   Current Facility-Administered Medications on File Prior to Visit  Medication Dose Route Frequency Provider Last Rate Last Admin   adenosine (diagnostic) (ADENOSCAN) infusion 32.4 mg  0.56 mg/kg Intravenous Once Jake Bathe, MD        Allergies  Allergen Reactions   Bactrim [Sulfamethoxazole-Trimethoprim] Hives and Itching   Feraheme [Ferumoxytol] Itching   Iron Other (See Comments)    intolerance   Zestril [Lisinopril] Other (See Comments)    Hyperkalemia     Social History   Socioeconomic History   Marital status: Divorced    Spouse name: Not on file   Number of children: 0   Years of education: 2 years of college    Highest education level: Not on file  Occupational History    Occupation: unemployed    Comment: worked at a group   Occupation: disability  Tobacco Use   Smoking status: Former    Current packs/day: 0.00    Average packs/day: 0.3 packs/day for 2.0 years (0.5 ttl pk-yrs)    Types: Cigarettes    Start date: 03/05/2011    Quit date: 03/04/2013    Years since quitting: 10.6   Smokeless tobacco: Never  Vaping Use   Vaping status: Never Used  Substance and Sexual Activity   Alcohol use: No    Alcohol/week: 0.0 standard drinks of alcohol   Drug use: Not Currently    Frequency: 4.0 times per week    Types: Marijuana   Sexual activity: Yes    Partners: Female    Birth control/protection: None    Comment: women preference   Other Topics Concern   Not on file  Social History Narrative   Occupation: currently unemployed   Single   Homosexual,     Used to be a gang member, got arrested for robbing a gas station (March - June 2012), is cleared now and lives away from her previous friends.          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        Update 04/09/2020   Has own apartment   Caffeine: maybe 2 cans of soda/day    Right handed      Update 03/21/2023   Lives with mother   Caffeine: once in awhile    Social Determinants of Health   Financial Resource Strain: Medium Risk (01/20/2022)   Overall Financial Resource Strain (CARDIA)    Difficulty of Paying Living Expenses: Somewhat hard  Food Insecurity: No Food Insecurity (10/10/2023)   Hunger Vital Sign    Worried About Running Out of Food in the Last Year: Never true    Ran Out of Food in the Last Year: Never true  Transportation Needs: No Transportation Needs (10/10/2023)   PRAPARE - Administrator, Civil Service (Medical): No    Lack of Transportation (Non-Medical): No  Physical Activity: Inactive (10/29/2019)    Exercise Vital Sign    Days of Exercise per Week: 0 days    Minutes of Exercise per Session: 0 min  Stress: Not on file  Social  Connections: Not on file  Intimate Partner Violence: Not At Risk (10/10/2023)   Humiliation, Afraid, Rape, and Kick questionnaire    Fear of Current or Ex-Partner: No    Emotionally Abused: No    Physically Abused: No    Sexually Abused: No    Family History  Problem Relation Age of Onset   Multiple sclerosis Mother    Hypothyroidism Mother    Stroke Mother        at age 93 yo   Migraines Mother    Hyperlipidemia Maternal Grandmother    Hypertension Maternal Grandmother    Heart disease Maternal Grandmother        unknown type   Diabetes Maternal Grandmother    Hypertension Maternal Grandfather    Prostate cancer Maternal Grandfather    Diabetes type I Maternal Grandfather    Breast cancer Paternal Grandmother    Cancer Neg Hx     Past Surgical History:  Procedure Laterality Date   FOOT FUSION Right 2006   "put screws in it too" (09/19/2013)    ROS: Review of Systems Negative except as stated above  PHYSICAL EXAM: BP (!) 140/80   Pulse 76   Temp 97.9 F (36.6 C) (Oral)   Ht 5\' 4"  (1.626 m)   Wt 137 lb (62.1 kg)   SpO2 98%   BMI 23.52 kg/m   Physical Exam  General appearance -young to middle-age African-American female who appears chronically ill. Mental status -patient's mood is mellow  Chest - clear to auscultation, no wheezes, rales or rhonchi, symmetric air entry Heart -regular rate and rhythm, 2/6 systolic ejection murmur along the left sternal border.     09/05/2023    3:03 PM 07/04/2023    2:45 PM 05/11/2023   10:55 AM  Depression screen PHQ 2/9  Decreased Interest 1 1 2   Down, Depressed, Hopeless 1 1 3   PHQ - 2 Score 2 2 5   Altered sleeping 2 1 2   Tired, decreased energy 1 1 2   Change in appetite 0 0 0  Feeling bad or failure about yourself  1 1 1   Trouble concentrating 1 1 1   Moving slowly or fidgety/restless 1 1 1    Suicidal thoughts 0 1 1  PHQ-9 Score 8 8 13   Difficult doing work/chores Not difficult at all         Latest Ref Rng & Units 10/17/2023    9:35 AM 10/12/2023   10:25 AM 10/11/2023    4:42 AM  CMP  Glucose 70 - 99 mg/dL 213  85  086   BUN 6 - 23 mg/dL 28  26  39   Creatinine 0.40 - 1.20 mg/dL 5.78  4.69  6.29   Sodium 135 - 145 mEq/L 137  132  131   Potassium 3.5 - 5.1 mEq/L 4.4  5.0  6.2   Chloride 96 - 112 mEq/L 91  91  90   CO2 19 - 32 mEq/L 33  27  28   Calcium 8.4 - 10.5 mg/dL 52.8  9.3  9.1   Total Protein 6.0 - 8.3 g/dL 7.7   6.3   Total Bilirubin 0.2 - 1.2 mg/dL 0.5   0.8   Alkaline Phos 39 - 117 U/L 162   150   AST 0 - 37 U/L 17   37   ALT 0 - 35 U/L 13   28    Lipid Panel     Component Value Date/Time   CHOL 247 (H) 01/28/2023 0300  CHOL 239 (H) 12/28/2020 1432   TRIG 88 01/31/2023 0548   HDL 119 01/28/2023 0300   HDL 72 12/28/2020 1432   CHOLHDL 2.1 01/28/2023 0300   VLDL 13 01/28/2023 0300   LDLCALC 115 (H) 01/28/2023 0300   LDLCALC 151 (H) 12/28/2020 1432    CBC    Component Value Date/Time   WBC 5.9 10/17/2023 0935   RBC 4.62 10/17/2023 0935   HGB 12.2 10/17/2023 0935   HGB 9.4 (L) 03/30/2023 1439   HCT 39.5 10/17/2023 0935   HCT 30.0 (L) 03/30/2023 1439   PLT 197.0 R 10/17/2023 0935   PLT 174 03/30/2023 1439   MCV 85.3 10/17/2023 0935   MCV 89 03/30/2023 1439   MCH 26.6 10/12/2023 1025   MCHC 30.9 10/17/2023 0935   RDW 17.8 (H) 10/17/2023 0935   RDW 14.7 03/30/2023 1439   LYMPHSABS 0.9 05/04/2023 0030   LYMPHSABS 1.3 03/21/2023 1214   MONOABS 0.8 05/04/2023 0030   EOSABS 0.0 05/04/2023 0030   EOSABS 0.8 (H) 03/21/2023 1214   BASOSABS 0.0 05/04/2023 0030   BASOSABS 0.1 03/21/2023 1214    ASSESSMENT AND PLAN: 1. Hospital discharge follow-up   2. ESRD on dialysis [facility]) Encouraged her to continue being compliant with going to dialysis sessions.  Whenever she feels too sick to go to dialysis, she should be seen in the emergency room  because this signals that something is not right - Darbepoetin Alfa (ARANESP, ALBUMIN FREE,) 100 MCG/0.5ML SOSY injection; Inject 0.5 mLs (100 mcg total) into the skin every Friday at 6 PM.Strength: 100 MCG/0.5ML  Dispense: 4 mL; Refill: 2  3. Marijuana use, continuous Strongly encouraged her to cut back on marijuana use.  Advised patient that if use of marijuana is making her so out of it that she spills her medications on the floor and is not aware of it, this is concerning.  It is not contributing positively to her overall health. -Strongly encouraged her to start taking some responsibility and setting up her medications, keeping track of her appointments  4. Mild major depression (HCC) Encouraged her to call and schedule appointment with behavioral health for some counseling.  5. 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, with long-term current use of insulin (HCC) Followed by endocrinology.  6. Need for hepatitis A vaccination First hep A vaccine (Vaqta) given today.  She will be due for the second shot in 6 months.   Patient was given the opportunity to ask questions.  Patient verbalized understanding of the plan and was able to repeat key elements of the plan.   This documentation was completed using Paediatric nurse.  Any transcriptional errors are unintentional.  Orders Placed This Encounter  Procedures   Hepatitis A vaccine adult IM     Requested Prescriptions   Pending Prescriptions Disp Refills   Darbepoetin Alfa (ARANESP, ALBUMIN FREE,) 100 MCG/0.5ML SOSY injection 4 mL 2    Sig: Inject 0.5 mLs (100 mcg total) into the skin every Friday at 6 PM.Strength: 100 MCG/0.5ML    Return in about 4 months (around 02/28/2024) for Give appt with nurse in 6 mths for 2nd Hep A vaccine (Vaqta).  Jonah Blue, MD, FACP

## 2023-11-07 ENCOUNTER — Telehealth: Payer: Self-pay

## 2023-11-07 NOTE — Telephone Encounter (Signed)
Pt was contacted in regard to recent office visit plan by Dr. Chales Abrahams. -EGD for EV screening at [facility] (routine)  Left message for pt to call back

## 2023-11-08 ENCOUNTER — Other Ambulatory Visit: Payer: Self-pay

## 2023-11-08 DIAGNOSIS — K7460 Unspecified cirrhosis of liver: Secondary | ICD-10-CM

## 2023-11-08 NOTE — Telephone Encounter (Signed)
Pt mom [name]  was contacted in regard to recommendations for EGD. Pt was scheduled for the EGD on 02/05/2024 at 10:00 AM at [facility] with Dr. Chales Abrahams. Case ID [id number]. [name] made aware Pt was scheduled for a previsit on 01/02/2024 at 2:00. [name] made aware. Location provided.  [name]  verbalized understanding with all questions answered.

## 2023-11-09 ENCOUNTER — Encounter: Payer: Self-pay | Admitting: Internal Medicine

## 2023-11-10 ENCOUNTER — Emergency Department (HOSPITAL_BASED_OUTPATIENT_CLINIC_OR_DEPARTMENT_OTHER): Payer: MEDICAID

## 2023-11-10 ENCOUNTER — Encounter (HOSPITAL_BASED_OUTPATIENT_CLINIC_OR_DEPARTMENT_OTHER): Payer: Self-pay

## 2023-11-10 ENCOUNTER — Emergency Department (HOSPITAL_BASED_OUTPATIENT_CLINIC_OR_DEPARTMENT_OTHER)
Admission: EM | Admit: 2023-11-10 | Discharge: 2023-11-10 | Disposition: A | Discharge status: Home / Self Care | Payer: MEDICAID | Attending: Emergency Medicine | Admitting: Emergency Medicine

## 2023-11-10 ENCOUNTER — Other Ambulatory Visit: Payer: Self-pay

## 2023-11-10 DIAGNOSIS — N186 End stage renal disease: Secondary | ICD-10-CM | POA: Diagnosis not present

## 2023-11-10 DIAGNOSIS — Z992 Dependence on renal dialysis: Secondary | ICD-10-CM | POA: Insufficient documentation

## 2023-11-10 DIAGNOSIS — M79661 Pain in right lower leg: Secondary | ICD-10-CM | POA: Insufficient documentation

## 2023-11-10 DIAGNOSIS — E1122 Type 2 diabetes mellitus with diabetic chronic kidney disease: Secondary | ICD-10-CM | POA: Insufficient documentation

## 2023-11-10 LAB — CBC WITH DIFFERENTIAL/PLATELET
Abs Immature Granulocytes: 0.02 10*3/uL (ref 0.00–0.07)
Basophils Absolute: 0 10*3/uL (ref 0.0–0.1)
Basophils Relative: 1 %
Eosinophils Absolute: 0.9 10*3/uL — ABNORMAL HIGH (ref 0.0–0.5)
Eosinophils Relative: 13 %
HCT: 36.6 % (ref 36.0–46.0)
Hemoglobin: 11.5 g/dL — ABNORMAL LOW (ref 12.0–15.0)
Immature Granulocytes: 0 %
Lymphocytes Relative: 18 %
Lymphs Abs: 1.3 10*3/uL (ref 0.7–4.0)
MCH: 26.8 pg (ref 26.0–34.0)
MCHC: 31.4 g/dL (ref 30.0–36.0)
MCV: 85.3 fL (ref 80.0–100.0)
Monocytes Absolute: 1 10*3/uL (ref 0.1–1.0)
Monocytes Relative: 14 %
Neutro Abs: 3.8 10*3/uL (ref 1.7–7.7)
Neutrophils Relative %: 54 %
Platelets: 115 10*3/uL — ABNORMAL LOW (ref 150–400)
RBC: 4.29 MIL/uL (ref 3.87–5.11)
RDW: 17.4 % — ABNORMAL HIGH (ref 11.5–15.5)
WBC: 7.1 10*3/uL (ref 4.0–10.5)
nRBC: 0 % (ref 0.0–0.2)

## 2023-11-10 LAB — MAGNESIUM: Magnesium: 2.1 mg/dL (ref 1.7–2.4)

## 2023-11-10 LAB — BASIC METABOLIC PANEL
Anion gap: 10 (ref 5–15)
BUN: 18 mg/dL (ref 6–20)
CO2: 35 mmol/L — ABNORMAL HIGH (ref 22–32)
Calcium: 9.9 mg/dL (ref 8.9–10.3)
Chloride: 88 mmol/L — ABNORMAL LOW (ref 98–111)
Creatinine, Ser: 4.22 mg/dL — ABNORMAL HIGH (ref 0.44–1.00)
GFR, Estimated: 13 mL/min — ABNORMAL LOW (ref >60)
Glucose, Bld: 313 mg/dL — ABNORMAL HIGH (ref 70–99)
Potassium: 4 mmol/L (ref 3.5–5.1)
Sodium: 133 mmol/L — ABNORMAL LOW (ref 135–145)

## 2023-11-10 MED ORDER — APIXABAN 2.5 MG PO TABS
10.0000 mg | ORAL_TABLET | Freq: Once | ORAL | Status: AC
  Administered 2023-11-10: 10 mg via ORAL
  Filled 2023-11-10: qty 4

## 2023-11-10 MED ORDER — OXYCODONE HCL 5 MG PO TABS
5.0000 mg | ORAL_TABLET | Freq: Once | ORAL | Status: AC
  Administered 2023-11-10: 5 mg via ORAL
  Filled 2023-11-10: qty 1

## 2023-11-10 MED ORDER — OXYCODONE HCL 5 MG PO TABS: 5.0000 mg | ORAL_TABLET | Freq: Four times a day (QID) | ORAL | 0 refills | Status: DC | PRN

## 2023-11-10 NOTE — ED Triage Notes (Signed)
Patient arrives with complaints of right leg pain x3 days. Sent here to rule out a blood clot in her right leg. Rates pain a 10/10.   Also a dialysis patient. MWF schedule. Left arm restricted.

## 2023-11-10 NOTE — ED Provider Notes (Signed)
Angela [name] Provider Note   CSN: [id number] Arrival date & time: 11/10/23  1750     History  Chief Complaint  Patient presents with   Leg Pain    right    Angela [name] is a 37 y.o. female.  Patient here with right calf pain for the last 3 days.  Denies any trauma.  History of CKD, diabetes.  On dialysis.  History of stroke.  Patient sent here for evaluation from her dialysis clinic.  She denies any weakness numbness tingling.  Pain focally in the calf.  She has had no recent surgery or travel.  No history of blood clots.  Denies any redness or swelling.  Nothing makes it worse or better.  The history is provided by the patient.       Home Medications Prior to Admission medications   Medication Sig Start Date End Date Taking? Authorizing Provider  oxyCODONE (ROXICODONE) 5 MG immediate release tablet Take 1 tablet (5 mg total) by mouth every 6 (six) hours as needed for up to 10 doses. 11/10/23  Yes Bria Sparr, DO  albuterol (PROVENTIL HFA) 108 (90 Base) MCG/ACT inhaler Inhale 2 puffs into the lungs every 6 (six) hours as needed for wheezing or shortness of breath. 08/19/21   Marcine Matar, MD  amLODipine (NORVASC) 10 MG tablet Take 1 tablet (10 mg total) by mouth at bedtime. 09/17/23   Marcine Matar, MD  B Complex-C-Folic Acid (DIALYVITE TABLET) TABS Take 1 tablet by mouth daily. 09/26/23   [provider]  busPIRone (BUSPAR) 15 MG tablet Take 1 tablet (15 mg total) by mouth at bedtime. 09/17/23   Marcine Matar, MD  carvedilol (COREG) 6.25 MG tablet Take 1 tablet (6.25 mg total) by mouth 2 (two) times daily with a meal. 09/17/23   Marcine Matar, MD  cinacalcet (SENSIPAR) 30 MG tablet Take 30 mg by mouth every evening. 09/19/23   [provider]  Darbepoetin Alfa (ARANESP, ALBUMIN FREE,) 100 MCG/0.5ML SOSY injection Inject 0.5 mLs (100 mcg total) into the skin every Friday at 6 PM.Strength: 100 MCG/0.5ML  10/31/23   Marcine Matar, MD  diltiazem (CARDIZEM) 60 MG tablet Take 1 tablet (60 mg total) by mouth every 6 (six) hours. 09/17/23   Marcine Matar, MD  escitalopram (LEXAPRO) 20 MG tablet Take 1 tablet (20 mg total) by mouth daily. 09/17/23   Marcine Matar, MD  gabapentin (NEURONTIN) 100 MG capsule Take 1 capsule (100 mg total) by mouth 3 (three) times daily. 09/17/23   Marcine Matar, MD  hydrALAZINE (APRESOLINE) 100 MG tablet Take 1 tablet (100 mg total) by mouth 3 (three) times daily. 09/17/23   Marcine Matar, MD  insulin aspart (NOVOLOG FLEXPEN) 100 UNIT/ML FlexPen Inject 2-3 Units into the skin 3 (three) times daily with meals. Per sliding scale 09/17/23   Marcine Matar, MD  levETIRAcetam (KEPPRA) 500 MG tablet Take 1 tablet (500 mg total) by mouth 2 (two) times daily. With 250mg (1/2 tab) supplement after dialysis. 09/17/23   Marcine Matar, MD  levothyroxine (SYNTHROID) 200 MCG tablet Take 1 tablet (200 mcg total) by mouth daily before breakfast. Stop the 175 mcg dose 09/17/23   Marcine Matar, MD  [facility] 5 g packet Take 1 packet by mouth daily. 07/28/23   [provider]  metoCLOPramide (REGLAN) 5 MG tablet Take 1 tablet (5 mg total) by mouth 2 (two) times daily with a meal. Patient taking  differently: Take 5 mg by mouth in the morning. 03/13/23   Marcine Matar, MD  ondansetron (ZOFRAN-ODT) 8 MG disintegrating tablet Take 1 tablet (8 mg total) by mouth every 8 (eight) hours as needed for nausea or vomiting. 09/19/23   Anson Fret, MD  pantoprazole (PROTONIX) 40 MG tablet Take 1 tablet (40 mg total) by mouth daily. 10/17/23   Marcine Matar, MD  RENVELA 800 MG tablet Take 1,600 mg by mouth 3 (three) times daily. 09/25/23   [provider]  TRESIBA FLEXTOUCH 100 UNIT/ML FlexTouch Pen Inject 5 Units into the skin 2 (two) times daily. 09/17/23   Marcine Matar, MD      Allergies    Bactrim [sulfamethoxazole-trimethoprim], Feraheme  [ferumoxytol], Iron, and Zestril [lisinopril]    Review of Systems   Review of Systems  Physical Exam Updated Vital Signs BP (!) 190/100   Pulse 85   Temp 99 F (37.2 C)   Resp 20   Ht 5\' 4"  (1.626 m)   Wt 63 kg   SpO2 96%   BMI 23.84 kg/m  Physical Exam Vitals and nursing note reviewed.  Constitutional:      General: She is not in acute distress.    Appearance: She is well-developed.  HENT:     Head: Normocephalic and atraumatic.  Eyes:     Extraocular Movements: Extraocular movements intact.     Conjunctiva/sclera: Conjunctivae normal.     Pupils: Pupils are equal, round, and reactive to light.  Cardiovascular:     Rate and Rhythm: Normal rate and regular rhythm.     Pulses: Normal pulses.     Heart sounds: No murmur heard.    Comments: Palpable pulses to the DP and PT bilaterally in her feet, 2+ in all 4 places. Pulmonary:     Effort: Pulmonary effort is normal. No respiratory distress.     Breath sounds: Normal breath sounds.  Abdominal:     Palpations: Abdomen is soft.     Tenderness: There is no abdominal tenderness.  Musculoskeletal:        General: Tenderness present. No swelling.     Cervical back: Neck supple.     Comments: Tenderness to the right calf  Skin:    General: Skin is warm and dry.     Capillary Refill: Capillary refill takes less than 2 seconds.     Comments: There is no major warmth or swelling or redness to her calf area  Neurological:     General: No focal deficit present.     Mental Status: She is alert.     Sensory: No sensory deficit.     Motor: No weakness.  Psychiatric:        Mood and Affect: Mood normal.     ED Results / Procedures / Treatments   Labs (all labs ordered are listed, but only abnormal results are displayed) Labs Reviewed  CBC WITH DIFFERENTIAL/PLATELET - Abnormal; Notable for the following components:      Result Value   Hemoglobin 11.5 (*)    RDW 17.4 (*)    Platelets 115 (*)    Eosinophils Absolute 0.9  (*)    All other components within normal limits  BASIC METABOLIC PANEL - Abnormal; Notable for the following components:   Sodium 133 (*)    Chloride 88 (*)    CO2 35 (*)    Glucose, Bld 313 (*)    Creatinine, Ser 4.22 (*)    GFR, Estimated 13 (*)  All other components within normal limits  MAGNESIUM    EKG None  Radiology DG Tibia/Fibula Right  Result Date: 11/10/2023 CLINICAL DATA:  Pain EXAM: RIGHT TIBIA AND FIBULA - 2 VIEW COMPARISON:  None Available. FINDINGS: There is no evidence of fracture or other focal bone lesions. Orthopedic screws are seen at the level of the first metatarsophalangeal joint. There is diffuse soft tissue swelling. Peripheral vascular calcifications are present. IMPRESSION: 1. No acute fracture or dislocation. 2. Diffuse soft tissue swelling. Electronically Signed   By: Darliss Cheney M.D.   On: 11/10/2023 21:40    Procedures Procedures    Medications Ordered in ED Medications  apixaban (ELIQUIS) tablet 10 mg (has no administration in time range)  oxyCODONE (Oxy IR/ROXICODONE) immediate release tablet 5 mg (5 mg Oral Given 11/10/23 2118)    ED Course/ Medical Decision Making/ A&P                                 Medical Decision Making Amount and/or Complexity of Data Reviewed Labs: ordered. Radiology: ordered.  Risk Prescription drug management.   Khaleya Awadalla is here with right calf pain.  History of CKD on dialysis, history of stroke, diabetes.  Vital signs overall unremarkable except for hypertension.  Neurologically patient is intact.  She is got good pulses in her lower extremities especially her DP and PT bilaterally in her legs.  She has tenderness to the right calf.  Seems like maybe spasms.  Differential could be spasm seems less likely to be blood clot versus electrolyte abnormality.  Overall we do not have DVT study available at this time.  Will get x-rays and check basic labs and then likely give her dose of Eliquis and have her  return tomorrow for DVT study.  Lab work is unremarkable.  No significant potassium or magnesium issues.  Patient not in DKA.  Blood sugar mildly elevated.  Creatinine at baseline.  No leukocytosis or significant anemia.  X-ray per my review and interpretation shows no fracture or malalignment.  Radiology report with some mild soft tissue swelling.  Overall compartments are soft.  There is no sign of infection on exam.  I suspect either calf strain or may be DVT or some other inflammatory process.  I have given her her first dose of Eliquis tonight and she will come back tomorrow for a DVT which has been arranged for 2:00 tomorrow.  She understands return precautions.  I will give her crutches to bear weight as tolerated.  Recommend ice and rest.  If DVT study is negative I recommend close follow-up with her primary care doctor to further evaluate.  I do not think that this is an arterial process given that she has good pulses but it is possible given some of the peripheral vascular disease I can see on x-ray.  But she is got good motor and strength and I do not think there is any acute arterial compromise at this time.  Patient understands return precautions.  Given Roxicodone prescription for breakthrough pain.  Recommend ice and rest.  Discharged.  This chart was dictated using voice recognition software.  Despite best efforts to proofread,  errors can occur which can change the documentation meaning.         Final Clinical Impression(s) / ED Diagnoses Final diagnoses:  Right calf pain    Rx / DC Orders ED Discharge Orders  Ordered    US Venous Img Lower Unilateral Right        11/10/23 2041    oxyCODONE (ROXICODONE) 5 MG immediate release tablet  Every 6 hours PRN        11/10/23 2213              Virgina Norfolk, DO 11/10/23 2213

## 2023-11-10 NOTE — Discharge Instructions (Addendum)
Come back for ultrasound tomorrow as scheduled.  I have already treated you with the first dose of blood thinner in case this is a DVT.  Suspect that if DVT study is negative that this is a musculoskeletal pain.  Use crutches and bear weight as tolerated.  Follow-up with her primary care doctor.  I have prescribed you narcotic pain medicine for breakthrough pain called Roxicodone.  Do not mix with alcohol or drugs or other dangerous activities including driving.

## 2023-11-11 ENCOUNTER — Ambulatory Visit (HOSPITAL_BASED_OUTPATIENT_CLINIC_OR_DEPARTMENT_OTHER)
Admission: RE | Admit: 2023-11-11 | Discharge: 2023-11-11 | Disposition: A | Discharge status: Home / Self Care | Payer: MEDICAID | Source: Ambulatory Visit | Attending: Emergency Medicine | Admitting: Emergency Medicine

## 2023-11-11 DIAGNOSIS — M79661 Pain in right lower leg: Secondary | ICD-10-CM | POA: Diagnosis present

## 2023-11-26 ENCOUNTER — Encounter: Payer: Self-pay | Admitting: Internal Medicine

## 2023-11-28 ENCOUNTER — Telehealth: Payer: MEDICAID | Admitting: Internal Medicine

## 2023-11-30 ENCOUNTER — Telehealth: Payer: MEDICAID | Admitting: Internal Medicine

## 2023-12-05 NOTE — Telephone Encounter (Signed)
FYI: Received fax from [facility] on 11/30/23 stating, patient was contacted 09/28/23 but did not wish to make an appointment.

## 2023-12-05 NOTE — Telephone Encounter (Signed)
Noted thanks. FYI Dr Lucia Gaskins, this was for ulnar neuropathy.

## 2023-12-21 ENCOUNTER — Encounter: Payer: Self-pay | Admitting: Internal Medicine

## 2023-12-22 ENCOUNTER — Other Ambulatory Visit: Payer: Self-pay | Admitting: *Deleted

## 2023-12-22 DIAGNOSIS — E440 Moderate protein-calorie malnutrition: Secondary | ICD-10-CM

## 2023-12-22 DIAGNOSIS — E46 Unspecified protein-calorie malnutrition: Secondary | ICD-10-CM

## 2023-12-22 MED ORDER — METOCLOPRAMIDE HCL 5 MG PO TABS: 5.0000 mg | ORAL_TABLET | Freq: Two times a day (BID) | ORAL | 1 refills | Status: DC

## 2023-12-25 ENCOUNTER — Telehealth: Payer: Self-pay | Admitting: *Deleted

## 2023-12-25 NOTE — Telephone Encounter (Signed)
 This patient is already scheduled at the hospital - thank you

## 2023-12-25 NOTE — Telephone Encounter (Signed)
Angela [name],  This patient has end stage renal disease and is on hemodialysis; their procedure will need to be done at the hospital.  Best regards,  Cyndee Brightly CRNA, MS

## 2024-01-02 ENCOUNTER — Ambulatory Visit: Payer: MEDICAID

## 2024-01-02 ENCOUNTER — Telehealth: Payer: Self-pay | Admitting: *Deleted

## 2024-01-02 NOTE — Progress Notes (Unsigned)
 Angela [name]

## 2024-01-05 NOTE — Telephone Encounter (Signed)
Pt was contacted in regard to recent note. Pt was notified that Dr. Chales Abrahams availability in February and March was only on Mondays. Dr. Chales Abrahams has availability on April 17th tentatively although the schedule is not complete. Pt stated that she was available for that date. Pt was notified that I would create a reminder and call her in two weeks. Scheduling  contacted and procedure for 02/05/2024 was canceled as requested by pt. Pt made aware Pt verbalized understanding with all questions answered.

## 2024-01-19 NOTE — Telephone Encounter (Signed)
Received reminder. Pt was contacted and notified of the availability for the 04/04/2024 date at [facility] for her EGD with Dr. Chales Abrahams. Pt was in agreement. EGD was scheduled on 04/04/2024 at 7:30 AM with Dr. Chales Abrahams at [facility]. Pt made aware. Case ID [id number] Pt was scheduled for a previsit on 02/20/2024 at 11:00 AM. Pt made aware. Pt verbalized understanding with all questions answered.

## 2024-01-26 ENCOUNTER — Other Ambulatory Visit: Payer: Self-pay | Admitting: Internal Medicine

## 2024-01-27 ENCOUNTER — Encounter: Payer: Self-pay | Admitting: Internal Medicine

## 2024-01-29 NOTE — Telephone Encounter (Signed)
 Requested Prescriptions  Pending Prescriptions Disp Refills   levothyroxine  (SYNTHROID ) 200 MCG tablet [Pharmacy Med Name: LEVOTHYROXINE  0.2MG  ( ) TAB] 90 tablet 1    Sig: TAKE 1 TABLET(200 MCG) BY MOUTH DAILY BEFORE BREAKFAST. STOP THE 175 MCG DOSE     Endocrinology:  Hypothyroid Agents Passed - 01/29/2024  8:44 AM      Passed - TSH in normal range and within 360 days    TSH  Date Value Ref Range Status  08/08/2023 0.413 0.350 - 4.500 uIU/mL Final    Comment:    Performed by a 3rd Generation assay with a functional sensitivity of <=0.01 uIU/mL. Performed at [facility], [address] [place], [place] [address]   07/25/2023 4.740 (H) 0.450 - 4.500 uIU/mL Final         Passed - Valid encounter within last 12 months    Recent Outpatient Visits           3 months ago Hospital discharge follow-up   [place] [facility] Dept Of [place]. 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 [facility] Lawrance Presume, MD   6 months ago Pap smear for cervical cancer screening   [place] [facility] [place] [facility] Dept Of [place]. 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 [facility] Lawrance Presume, MD   10 months ago Hospital discharge follow-up   [facility] - A Dept Of [place]. [facility] Lawrance Presume, MD       Future Appointments             In 1 month Lincoln Renshaw Rexine Cater, MD [facility] [place] - A Dept Of [place]. [facility]

## 2024-02-05 ENCOUNTER — Encounter (HOSPITAL_COMMUNITY): Payer: Self-pay

## 2024-02-05 ENCOUNTER — Ambulatory Visit (HOSPITAL_COMMUNITY): Admit: 2024-02-05 | Payer: MEDICAID | Admitting: Gastroenterology

## 2024-02-05 SURGERY — ESOPHAGOGASTRODUODENOSCOPY (EGD) WITH PROPOFOL
Anesthesia: Monitor Anesthesia Care

## 2024-02-16 ENCOUNTER — Other Ambulatory Visit: Payer: Self-pay | Admitting: Internal Medicine

## 2024-02-19 NOTE — Telephone Encounter (Signed)
 Unable to refill per protocol, Rx expired. Discontinued 10/10/23.  Requested Prescriptions  Pending Prescriptions Disp Refills   DROPLET PEN NEEDLES 32G X 4 MM MISC [Pharmacy Med Name: DROPLET PEN NEEDLES 32GX4MM] 100 each 3    Sig: USE AS DIRECTED WITH INSULIN PENS     Endocrinology: Diabetes - Testing Supplies Passed - 02/19/2024 12:55 PM      Passed - Valid encounter within last 12 months    Recent Outpatient Visits           3 months ago Hospital discharge follow-up   [place] [facility] - A Dept Of [place]. 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 [facility] Marcine Matar, MD   7 months ago Pap smear for cervical cancer screening   [place] [facility] - A Dept Of [place]. 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 [facility] Marcine Matar, MD   11 months ago Hospital discharge follow-up   [facility] - A Dept Of [place]. [facility] Marcine Matar, MD       Future Appointments             In 2 weeks Marcine Matar, MD [facility] [place] - A Dept Of [place]a Bridegroom. [name]

## 2024-02-20 ENCOUNTER — Ambulatory Visit (AMBULATORY_SURGERY_CENTER): Payer: MEDICAID | Admitting: *Deleted

## 2024-02-20 VITALS — Ht 64.0 in | Wt 138.0 lb

## 2024-02-20 DIAGNOSIS — K7460 Unspecified cirrhosis of liver: Secondary | ICD-10-CM

## 2024-02-20 DIAGNOSIS — R7989 Other specified abnormal findings of blood chemistry: Secondary | ICD-10-CM

## 2024-02-20 NOTE — Progress Notes (Signed)
 PV completed over the phone.  Pt is going to be done at [facility] for several reasons- hx of difficult intubation, dialysis.  Instructed regarding EGD and instructions sent via [facility]

## 2024-03-05 ENCOUNTER — Encounter: Payer: Self-pay | Admitting: Internal Medicine

## 2024-03-05 ENCOUNTER — Ambulatory Visit: Payer: MEDICAID | Attending: Internal Medicine | Admitting: Internal Medicine

## 2024-03-05 VITALS — BP 163/87 | HR 73 | Temp 98.6°F | Ht 64.0 in | Wt 139.0 lb

## 2024-03-05 DIAGNOSIS — Z794 Long term (current) use of insulin: Secondary | ICD-10-CM

## 2024-03-05 DIAGNOSIS — E039 Hypothyroidism, unspecified: Secondary | ICD-10-CM

## 2024-03-05 DIAGNOSIS — E1022 Type 1 diabetes mellitus with diabetic chronic kidney disease: Secondary | ICD-10-CM

## 2024-03-05 DIAGNOSIS — Z23 Encounter for immunization: Secondary | ICD-10-CM | POA: Diagnosis not present

## 2024-03-05 DIAGNOSIS — N186 End stage renal disease: Secondary | ICD-10-CM | POA: Diagnosis not present

## 2024-03-05 DIAGNOSIS — I120 Hypertensive chronic kidney disease with stage 5 chronic kidney disease or end stage renal disease: Secondary | ICD-10-CM | POA: Diagnosis not present

## 2024-03-05 DIAGNOSIS — F1290 Cannabis use, unspecified, uncomplicated: Secondary | ICD-10-CM

## 2024-03-05 DIAGNOSIS — Z992 Dependence on renal dialysis: Secondary | ICD-10-CM

## 2024-03-05 DIAGNOSIS — E1029 Type 1 diabetes mellitus with other diabetic kidney complication: Secondary | ICD-10-CM

## 2024-03-05 MED ORDER — METOCLOPRAMIDE HCL 5 MG PO TABS: 5.0000 mg | ORAL_TABLET | Freq: Two times a day (BID) | ORAL | 1 refills | Status: DC

## 2024-03-05 MED ORDER — PEN NEEDLES 31G X 8 MM MISC: 6 refills | Status: AC

## 2024-03-05 NOTE — Progress Notes (Signed)
 Patient ID: Angela [name], female    DOB: 10-14-1986  MRN: [id number]  CC: Hypertension (HTN & DM f/u./Requesting refill for Novolog needles Valentino Hue to pneumonia vax)   Subjective: Angela [name] is a 38 y.o. female who presents for chronic ds management. Mom is with her Her concerns today include:  Pt with hx of DM type 1 with neuropathy, gastroparesis and nephropathy, HTN, ESRD on HD, hemorrhagic stroke 01/2023 , ACD/IDA (on Aranesp inj weekly), hypothyroid, HL, migraines, thyroid nodules (12/2022 at [facility] bx of isthmus nodule and RLL path -follicular lesion of undetermined significance, neg Afirma), MDD   Discussed the use of AI scribe software for clinical note transcription with the patient, who gave verbal consent to proceed.  History of Present Illness   The patient, with a history of diabetes, hypertension, kidney disease requiring dialysis, and thyroid disease, presents for a routine follow-up.   DM:  A1C 7.9 8 days ago at [facility]. Previous level was 6.6 09/2023 -still Tresiba (5 units twice a day) and Novolog (2-3 units with meals) and is due to start using an insulin pump. Picks up pump today from her endocrine office.   -admits to struggling with diet and medication adherence, particularly with insulin administration after consuming food at night, which is often triggered by marijuana use.  Mother still plays a big role in reminding her to take her medications on time.  HTN: on carvedilol (6.25mg  twice daily), hydralazine (100mg  three times daily), amlodipine (10mg  daily), and diltiazem (60mg  every six hours). However, the patient's blood pressure was elevated at 161/84 during the visit, due to take her second dose of hydralazine and Cardizem when she gets home.  Patient reports blood pressure is usually better during and after dialysis.  On her last dialysis blood pressure was 137/76.  The patient's mother assists with medication management by setting up doses for the  patient.  Hypothyroidism: Reports compliance with taking levothyroxine 200 mcg daily.  ESRD/chronic anemia: She has not been taking her Aranesp injections weekly because the injections hurt.  She takes Sensipar. She attends her dialysis 3 days a week.  She has been seen by the gastroenterologist.  Has an endoscopy scheduled for next month as part of the workup for questionable underlying cirrhosis.    HM: Due for pneumonia vaccine.  Agreeable to receiving that today.  She has adequate antibody levels for hepatitis B.  She received hepatitis A vaccine several months ago.  Will be due for her second shot in May.    Patient Active Problem List   Diagnosis Date Noted   History of cerebral hemorrhage 10/11/2023   Acute hyperkalemia 10/10/2023   Steatosis (HCC) 08/22/2023   Malnutrition of moderate degree 08/10/2023   Acute respiratory failure with hypoxia (HCC) 08/07/2023   Numbness and tingling in right hand 08/01/2023   Chronic neck pain with abnormal neurologic examination 08/01/2023   Cervical radiculopathy at C8 08/01/2023   Proliferative diabetic retinopathy of both eyes with macular edema associated with type 1 diabetes mellitus (HCC) 05/11/2023   Protein calorie malnutrition (HCC) 02/15/2023   Seizure (HCC) 10/15/2021   ESRD on dialysis (HCC) 10/15/2021   Mallory-Weiss tear 11/16/2020   Chronic migraine without aura without status migrainosus, not intractable 04/09/2020   Occipital neuralgia of right side 04/09/2020   Anxiety about health 10/29/2019   Cannabis abuse, episodic 10/29/2019   Abnormal uterine bleeding (AUB) 08/13/2019   Ovarian mass, left 11/27/2018   Moderate major depression (HCC) 11/27/2018   Secondary hyperparathyroidism (HCC)  05/01/2018   Anxiety and depression 06/23/2017   Insomnia 01/12/2016   Non compliance with medical treatment 01/05/2016   Mixed hyperlipidemia 01/05/2016   Neurogenic bladder 02/20/2015   Diabetic peripheral neuropathy associated with  type 1 diabetes mellitus (HCC) 02/20/2015   Iron deficiency 02/13/2015   Asthma 09/30/2014   Diabetic retinopathy (HCC) 09/30/2014   Atypical squamous cells of undetermined significance (ASCUS) on Papanicolaou smear of cervix 08/11/2014   Diabetic gastroparesis associated with type 1 diabetes mellitus (HCC) 08/05/2014   Anemia in chronic renal disease 08/05/2014   HTN (hypertension) 07/11/2012   GERD (gastroesophageal reflux disease) 09/26/2011   Hypothyroidism 09/14/2006   Uncontrolled type 1 diabetes mellitus with hypoglycemia, with long-term current use of insulin (HCC) 01/15/2000     Current Outpatient Medications on File Prior to Visit  Medication Sig Dispense Refill   albuterol (PROVENTIL HFA) 108 (90 Base) MCG/ACT inhaler Inhale 2 puffs into the lungs every 6 (six) hours as needed for wheezing or shortness of breath. 8 g 3   amLODipine (NORVASC) 10 MG tablet Take 1 tablet (10 mg total) by mouth at bedtime. 90 tablet 2   B Complex-C-Folic Acid (DIALYVITE TABLET) TABS Take 1 tablet by mouth daily.     busPIRone (BUSPAR) 15 MG tablet Take 1 tablet (15 mg total) by mouth at bedtime. 90 tablet 2   carvedilol (COREG) 6.25 MG tablet Take 1 tablet (6.25 mg total) by mouth 2 (two) times daily with a meal. 180 tablet 6   cinacalcet (SENSIPAR) 30 MG tablet Take 30 mg by mouth every evening.     Darbepoetin Alfa (ARANESP, ALBUMIN FREE,) 100 MCG/0.5ML SOSY injection Inject 0.5 mLs (100 mcg total) into the skin every Friday at 6 PM.Strength: 100 MCG/0.5ML 4 mL 2   diltiazem (CARDIZEM) 60 MG tablet Take 1 tablet (60 mg total) by mouth every 6 (six) hours. 120 tablet 6   escitalopram (LEXAPRO) 20 MG tablet Take 1 tablet (20 mg total) by mouth daily. 90 tablet 1   gabapentin (NEURONTIN) 100 MG capsule Take 1 capsule (100 mg total) by mouth 3 (three) times daily. 270 capsule 1   hydrALAZINE (APRESOLINE) 100 MG tablet Take 1 tablet (100 mg total) by mouth 3 (three) times daily. 270 tablet 1   insulin  aspart (NOVOLOG FLEXPEN) 100 UNIT/ML FlexPen Inject 2-3 Units into the skin 3 (three) times daily with meals. Per sliding scale 15 mL 6   levETIRAcetam (KEPPRA) 500 MG tablet Take 1 tablet (500 mg total) by mouth 2 (two) times daily. With 250mg (1/2 tab) supplement after dialysis. 198 tablet 4   levothyroxine (SYNTHROID) 200 MCG tablet TAKE 1 TABLET(200 MCG) BY MOUTH DAILY BEFORE BREAKFAST. STOP THE 175 MCG DOSE 90 tablet 1   LOKELMA 5 g packet Take 1 packet by mouth daily.     ondansetron (ZOFRAN-ODT) 8 MG disintegrating tablet Take 1 tablet (8 mg total) by mouth every 8 (eight) hours as needed for nausea or vomiting. 20 tablet 6   pantoprazole (PROTONIX) 40 MG tablet Take 1 tablet (40 mg total) by mouth daily. 90 tablet 1   RENVELA 800 MG tablet Take 1,600 mg by mouth 3 (three) times daily.     TRESIBA FLEXTOUCH 100 UNIT/ML FlexTouch Pen Inject 5 Units into the skin 2 (two) times daily. 15 mL 6   Current Facility-Administered Medications on File Prior to Visit  Medication Dose Route Frequency Provider Last Rate Last Admin   adenosine (diagnostic) (ADENOSCAN) infusion 32.4 mg  0.56 mg/kg Intravenous Once Champion,  Veverly Fells, MD        Allergies  Allergen Reactions   Iron Other (See Comments)    intolerance   Zestril [Lisinopril] Other (See Comments)    Hyperkalemia    Bactrim [Sulfamethoxazole-Trimethoprim] Hives and Itching   Feraheme [Ferumoxytol] Itching    Social History   Socioeconomic History   Marital status: Divorced    Spouse name: Not on file   Number of children: 0   Years of education: 2 years of college    Highest education level: Not on file  Occupational History   Occupation: unemployed    Comment: worked at a group   Occupation: disability  Tobacco Use   Smoking status: Former    Current packs/day: 0.00    Average packs/day: 0.3 packs/day for 2.0 years (0.5 ttl pk-yrs)    Types: Cigarettes    Start date: 03/05/2011    Quit date: 03/04/2013    Years since quitting:  11.0   Smokeless tobacco: Never  Vaping Use   Vaping status: Never Used  Substance and Sexual Activity   Alcohol use: No    Alcohol/week: 0.0 standard drinks of alcohol   Drug use: Not Currently    Frequency: 4.0 times per week    Types: Marijuana   Sexual activity: Yes    Partners: Female    Birth control/protection: None    Comment: women preference   Other Topics Concern   Not on file  Social History Narrative   Occupation: currently unemployed   Single   Homosexual,     Used to be a gang member, got arrested for robbing a gas station (March - June 2012), is cleared now and lives away from her previous friends.          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        Update 04/09/2020   Has own apartment   Caffeine: maybe 2 cans of soda/day    Right handed      Update 03/21/2023   Lives with mother   Caffeine: once in awhile    Social Drivers of Health   Financial Resource Strain: Medium Risk (01/20/2022)   Overall Financial Resource Strain (CARDIA)    Difficulty of Paying Living Expenses: Somewhat hard  Food Insecurity: No Food Insecurity (03/05/2024)   Hunger Vital Sign    Worried About Running Out of Food in the Last Year: Never true    Ran Out of Food in the Last Year: Never true  Transportation Needs: No Transportation Needs (03/05/2024)   PRAPARE - Administrator, Civil Service (Medical): No    Lack of Transportation (Non-Medical): No  Physical Activity: Inactive (03/05/2024)   Exercise Vital Sign    Days of Exercise per Week: 0 days    Minutes of Exercise per Session: 0 min  Stress: Stress Concern Present (03/05/2024)   [employer] of Occupational Health - Occupational Stress Questionnaire    Feeling of Stress : Rather much  Social Connections: Socially Isolated (03/05/2024)   Social  Connection and Isolation Panel [NHANES]    Frequency of Communication with Friends and Family: More than three times a week    Frequency of Social Gatherings with Friends and Family: Once a week    Attends Religious Services: Never    Database administrator or  Organizations: No    Attends Banker Meetings: Never    Marital Status: Divorced  Catering manager Violence: Not At Risk (03/05/2024)   Humiliation, Afraid, Rape, and Kick questionnaire    Fear of Current or Ex-Partner: No    Emotionally Abused: No    Physically Abused: No    Sexually Abused: No    Family History  Problem Relation Age of Onset   Multiple sclerosis Mother    Hypothyroidism Mother    Stroke Mother        at age 9 yo   Migraines Mother    Hyperlipidemia Maternal Grandmother    Hypertension Maternal Grandmother    Heart disease Maternal Grandmother        unknown type   Diabetes Maternal Grandmother    Hypertension Maternal Grandfather    Prostate cancer Maternal Grandfather    Diabetes type I Maternal Grandfather    Breast cancer Paternal Grandmother    Cancer Neg Hx    Colon cancer Neg Hx    Esophageal cancer Neg Hx    Rectal cancer Neg Hx    Stomach cancer Neg Hx     Past Surgical History:  Procedure Laterality Date   FOOT FUSION Right 2006   "put screws in it too" (09/19/2013)    ROS: Review of Systems Negative except as stated above  PHYSICAL EXAM: BP (!) 161/84 (BP Location: Left Arm, Patient Position: Sitting, Cuff Size: Normal)   Pulse 73   Temp 98.6 F (37 C) (Oral)   Ht 5\' 4"  (1.626 m)   Wt 139 lb (63 kg)   SpO2 95%   BMI 23.86 kg/m   Physical Exam  General appearance - alert, young appearing African-American female who appears chronically ill.  Mild edema of the upper eyelids Mental status - normal mood, behavior, speech, dress, motor activity, and thought processes Neck - supple, no significant adenopathy Chest - clear to auscultation, no wheezes, rales or  rhonchi, symmetric air entry Heart -regular rate rhythm, soft systolic ejection murmur along the left sternal border Extremities -trace lower extremity edema      Latest Ref Rng & Units 11/10/2023    9:20 PM 10/17/2023    9:35 AM 10/12/2023   10:25 AM  CMP  Glucose 70 - 99 mg/dL 409  811  85   BUN 6 - 20 mg/dL 18  28  26    Creatinine 0.44 - 1.00 mg/dL 9.14  7.82  9.56   Sodium 135 - 145 mmol/L 133  137  132   Potassium 3.5 - 5.1 mmol/L 4.0  4.4  5.0   Chloride 98 - 111 mmol/L 88  91  91   CO2 22 - 32 mmol/L 35  33  27   Calcium 8.9 - 10.3 mg/dL 9.9  21.3  9.3   Total Protein 6.0 - 8.3 g/dL  7.7    Total Bilirubin 0.2 - 1.2 mg/dL  0.5    Alkaline Phos 39 - 117 U/L  162    AST 0 - 37 U/L  17    ALT 0 - 35 U/L  13     Lipid Panel     Component Value Date/Time   CHOL 247 (H) 01/28/2023 0300   CHOL 239 (H) 12/28/2020 1432   TRIG 88 01/31/2023 0548   HDL 119 01/28/2023 0300   HDL 72 12/28/2020 1432   CHOLHDL 2.1 01/28/2023 0300   VLDL 13 01/28/2023 0300   LDLCALC 115 (H) 01/28/2023 0300  LDLCALC 151 (H) 12/28/2020 1432    CBC    Component Value Date/Time   WBC 7.1 11/10/2023 2120   RBC 4.29 11/10/2023 2120   HGB 11.5 (L) 11/10/2023 2120   HGB 9.4 (L) 03/30/2023 1439   HCT 36.6 11/10/2023 2120   HCT 30.0 (L) 03/30/2023 1439   PLT 115 (L) 11/10/2023 2120   PLT 174 03/30/2023 1439   MCV 85.3 11/10/2023 2120   MCV 89 03/30/2023 1439   MCH 26.8 11/10/2023 2120   MCHC 31.4 11/10/2023 2120   RDW 17.4 (H) 11/10/2023 2120   RDW 14.7 03/30/2023 1439   LYMPHSABS 1.3 11/10/2023 2120   LYMPHSABS 1.3 03/21/2023 1214   MONOABS 1.0 11/10/2023 2120   EOSABS 0.9 (H) 11/10/2023 2120   EOSABS 0.8 (H) 03/21/2023 1214   BASOSABS 0.0 11/10/2023 2120   BASOSABS 0.1 03/21/2023 1214    ASSESSMENT AND PLAN: 1. 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, with long-term current use of insulin (HCC) Discussed and encouraged healthy eating habits.  Advised to avoid late night  snacking especially on junk foods.  Encouraged her to cut back on marijuana use. She is plugged in with endocrinology at [facility].  She will continue with the Tresiba 5 units twice a day and NovoLog with meals. - metoCLOPramide (REGLAN) 5 MG tablet; Take 1 tablet (5 mg total) by mouth 2 (two) times daily with a meal.  Dispense: 180 tablet; Refill: 1 - Insulin Pen Needle (PEN NEEDLES) 31G X 8 MM MISC; UAD  Dispense: 100 each; Refill: 6  2. Hypertensive kidney disease with ESRD on dialysis (HCC) Repeat blood pressure today still not at goal.  She is on maximum dose of most of these medications.  She will continue current medications including carvedilol 6.25 mg twice a day, hydralazine 100 mg 3 times a day, Norvasc 10 mg daily and diltiazem 60 mg every 6 hours. -Advised the patient to let the nephrologist know who comes to her dialysis center that she has not been taking the Aranesp so that they can arrange to give it to her as needed during dialysis  3. Acquired hypothyroidism Continue levothyroxine 200 mcg daily  4. Marijuana use See #1 above  5. Need for Streptococcus pneumoniae vaccination (Primary) - Pneumococcal conjugate vaccine 20-valent   Patient was given the opportunity to ask questions.  Patient verbalized understanding of the plan and was able to repeat key elements of the plan.   This documentation was completed using Paediatric nurse.  Any transcriptional errors are unintentional.  Orders Placed This Encounter  Procedures   Pneumococcal conjugate vaccine 20-valent     Requested Prescriptions   Signed Prescriptions Disp Refills   metoCLOPramide (REGLAN) 5 MG tablet 180 tablet 1    Sig: Take 1 tablet (5 mg total) by mouth 2 (two) times daily with a meal.   Insulin Pen Needle (PEN NEEDLES) 31G X 8 MM MISC 100 each 6    Sig: UAD    Return in about 4 months (around 07/05/2024).  Jonah Blue, MD, FACP

## 2024-03-27 ENCOUNTER — Encounter (HOSPITAL_COMMUNITY): Payer: Self-pay | Admitting: Gastroenterology

## 2024-03-27 ENCOUNTER — Telehealth: Payer: Self-pay | Admitting: Gastroenterology

## 2024-03-27 NOTE — Telephone Encounter (Signed)
 Procedure:Endoscopy Procedure date: 04/04/24 Procedure location: [facility] Arrival Time: 6:00 am Spoke with the patient Y/N: Yes Any prep concerns? No  Has the patient obtained the prep from the pharmacy ? No prep needed Do you have a care partner and transportation: Yes Any additional concerns? No

## 2024-03-31 ENCOUNTER — Other Ambulatory Visit: Payer: Self-pay | Admitting: Internal Medicine

## 2024-04-02 NOTE — Progress Notes (Signed)
Attempted to obtain medical history. Unable to reach pt. At this time. HIPAA complaint voicemail left with pre-surgical testing number. 

## 2024-04-03 NOTE — Anesthesia Preprocedure Evaluation (Signed)
 Anesthesia Evaluation  Patient identified by MRN, date of birth, ID band Patient awake    Reviewed: Allergy & Precautions, H&P , NPO status , Patient's Chart, lab work & pertinent test results  Airway Mallampati: II  TM Distance: >3 FB Neck ROM: Full    Dental no notable dental hx.    Pulmonary asthma , former smoker   Pulmonary exam normal breath sounds clear to auscultation       Cardiovascular hypertension, Normal cardiovascular exam Rhythm:Regular Rate:Normal     Neuro/Psych  Headaches, Seizures -,  PSYCHIATRIC DISORDERS Anxiety Depression    CVA    GI/Hepatic Neg liver ROS,GERD  ,, Cirrhosis,   Endo/Other  diabetes, Type 1Hypothyroidism    Renal/GU ESRFRenal disease  negative genitourinary   Musculoskeletal negative musculoskeletal ROS (+)    Abdominal   Peds negative pediatric ROS (+)  Hematology  (+) Blood dyscrasia, anemia   Anesthesia Other Findings   Reproductive/Obstetrics negative OB ROS                             Anesthesia Physical Anesthesia Plan  ASA: 4  Anesthesia Plan: MAC   Post-op Pain Management:    Induction: Intravenous  PONV Risk Score and Plan: Propofol infusion and Treatment may vary due to age or medical condition  Airway Management Planned: Natural Airway  Additional Equipment:   Intra-op Plan:   Post-operative Plan:   Informed Consent: I have reviewed the patients History and Physical, chart, labs and discussed the procedure including the risks, benefits and alternatives for the proposed anesthesia with the patient or authorized representative who has indicated his/her understanding and acceptance.     Dental advisory given  Plan Discussed with: CRNA  Anesthesia Plan Comments:         Anesthesia Quick Evaluation

## 2024-04-04 ENCOUNTER — Other Ambulatory Visit: Payer: Self-pay

## 2024-04-04 ENCOUNTER — Encounter (HOSPITAL_COMMUNITY)
Admission: RE | Disposition: A | Discharge status: Home / Self Care | Payer: Self-pay | Source: Ambulatory Visit | Attending: Gastroenterology

## 2024-04-04 ENCOUNTER — Ambulatory Visit (HOSPITAL_COMMUNITY): Payer: MEDICAID

## 2024-04-04 ENCOUNTER — Ambulatory Visit (HOSPITAL_COMMUNITY)
Admission: RE | Admit: 2024-04-04 | Discharge: 2024-04-04 | Disposition: A | Discharge status: Home / Self Care | Payer: MEDICAID | Source: Ambulatory Visit | Attending: Gastroenterology | Admitting: Gastroenterology

## 2024-04-04 ENCOUNTER — Encounter (HOSPITAL_COMMUNITY): Payer: Self-pay | Admitting: Gastroenterology

## 2024-04-04 DIAGNOSIS — K219 Gastro-esophageal reflux disease without esophagitis: Secondary | ICD-10-CM | POA: Diagnosis not present

## 2024-04-04 DIAGNOSIS — R519 Headache, unspecified: Secondary | ICD-10-CM | POA: Diagnosis not present

## 2024-04-04 DIAGNOSIS — E039 Hypothyroidism, unspecified: Secondary | ICD-10-CM | POA: Diagnosis not present

## 2024-04-04 DIAGNOSIS — Z992 Dependence on renal dialysis: Secondary | ICD-10-CM | POA: Insufficient documentation

## 2024-04-04 DIAGNOSIS — K2950 Unspecified chronic gastritis without bleeding: Secondary | ICD-10-CM | POA: Diagnosis not present

## 2024-04-04 DIAGNOSIS — F419 Anxiety disorder, unspecified: Secondary | ICD-10-CM | POA: Diagnosis not present

## 2024-04-04 DIAGNOSIS — F32A Depression, unspecified: Secondary | ICD-10-CM | POA: Diagnosis not present

## 2024-04-04 DIAGNOSIS — K31A19 Gastric intestinal metaplasia without dysplasia, unspecified site: Secondary | ICD-10-CM | POA: Insufficient documentation

## 2024-04-04 DIAGNOSIS — J45909 Unspecified asthma, uncomplicated: Secondary | ICD-10-CM | POA: Diagnosis not present

## 2024-04-04 DIAGNOSIS — N186 End stage renal disease: Secondary | ICD-10-CM | POA: Diagnosis not present

## 2024-04-04 DIAGNOSIS — D696 Thrombocytopenia, unspecified: Secondary | ICD-10-CM | POA: Insufficient documentation

## 2024-04-04 DIAGNOSIS — R1013 Epigastric pain: Secondary | ICD-10-CM | POA: Diagnosis present

## 2024-04-04 DIAGNOSIS — I120 Hypertensive chronic kidney disease with stage 5 chronic kidney disease or end stage renal disease: Secondary | ICD-10-CM | POA: Insufficient documentation

## 2024-04-04 DIAGNOSIS — K2970 Gastritis, unspecified, without bleeding: Secondary | ICD-10-CM

## 2024-04-04 DIAGNOSIS — K31A15 Gastric intestinal metaplasia without dysplasia, involving multiple sites: Secondary | ICD-10-CM | POA: Diagnosis not present

## 2024-04-04 DIAGNOSIS — E1022 Type 1 diabetes mellitus with diabetic chronic kidney disease: Secondary | ICD-10-CM | POA: Diagnosis not present

## 2024-04-04 DIAGNOSIS — E1043 Type 1 diabetes mellitus with diabetic autonomic (poly)neuropathy: Secondary | ICD-10-CM | POA: Insufficient documentation

## 2024-04-04 DIAGNOSIS — D631 Anemia in chronic kidney disease: Secondary | ICD-10-CM | POA: Insufficient documentation

## 2024-04-04 DIAGNOSIS — T182XXA Foreign body in stomach, initial encounter: Secondary | ICD-10-CM | POA: Diagnosis not present

## 2024-04-04 DIAGNOSIS — Z8673 Personal history of transient ischemic attack (TIA), and cerebral infarction without residual deficits: Secondary | ICD-10-CM | POA: Insufficient documentation

## 2024-04-04 DIAGNOSIS — I10 Essential (primary) hypertension: Secondary | ICD-10-CM | POA: Diagnosis not present

## 2024-04-04 DIAGNOSIS — K7460 Unspecified cirrhosis of liver: Secondary | ICD-10-CM | POA: Diagnosis not present

## 2024-04-04 DIAGNOSIS — Z87891 Personal history of nicotine dependence: Secondary | ICD-10-CM | POA: Insufficient documentation

## 2024-04-04 DIAGNOSIS — K3184 Gastroparesis: Secondary | ICD-10-CM | POA: Diagnosis not present

## 2024-04-04 HISTORY — PX: ESOPHAGOGASTRODUODENOSCOPY (EGD) WITH PROPOFOL: SHX5813

## 2024-04-04 LAB — POCT I-STAT, CHEM 8
BUN: 26 mg/dL — ABNORMAL HIGH (ref 6–20)
Calcium, Ion: 1.19 mmol/L (ref 1.15–1.40)
Chloride: 93 mmol/L — ABNORMAL LOW (ref 98–111)
Creatinine, Ser: 5 mg/dL — ABNORMAL HIGH (ref 0.44–1.00)
Glucose, Bld: 204 mg/dL — ABNORMAL HIGH (ref 70–99)
HCT: 42 % (ref 36.0–46.0)
Hemoglobin: 14.3 g/dL (ref 12.0–15.0)
Potassium: 4.2 mmol/L (ref 3.5–5.1)
Sodium: 132 mmol/L — ABNORMAL LOW (ref 135–145)
TCO2: 30 mmol/L (ref 22–32)

## 2024-04-04 SURGERY — ESOPHAGOGASTRODUODENOSCOPY (EGD) WITH PROPOFOL
Anesthesia: Monitor Anesthesia Care

## 2024-04-04 MED ORDER — PROPOFOL 500 MG/50ML IV EMUL
INTRAVENOUS | Status: AC
  Filled 2024-04-04: qty 50

## 2024-04-04 MED ORDER — PROPOFOL 500 MG/50ML IV EMUL
INTRAVENOUS | Status: DC | PRN
  Administered 2024-04-04: 30 mg via INTRAVENOUS
  Administered 2024-04-04: 80 mg via INTRAVENOUS
  Administered 2024-04-04: 30 mg via INTRAVENOUS

## 2024-04-04 MED ORDER — SODIUM CHLORIDE 0.9 % IV SOLN
INTRAVENOUS | Status: AC | PRN
  Administered 2024-04-04: 10 mL via INTRAMUSCULAR

## 2024-04-04 MED ORDER — PROPOFOL 1000 MG/100ML IV EMUL
INTRAVENOUS | Status: AC
  Filled 2024-04-04: qty 100

## 2024-04-04 MED ORDER — LIDOCAINE HCL (CARDIAC) PF 100 MG/5ML IV SOSY
PREFILLED_SYRINGE | INTRAVENOUS | Status: DC | PRN
Start: 2024-04-04 — End: 2024-04-04
  Administered 2024-04-04: 70 mg via INTRAVENOUS

## 2024-04-04 SURGICAL SUPPLY — 14 items

## 2024-04-04 NOTE — Transfer of Care (Signed)
 Immediate Anesthesia Transfer of Care Note  Patient: Angela [name]  Procedure(s) Performed: ESOPHAGOGASTRODUODENOSCOPY (EGD) WITH PROPOFOL  Patient Location: PACU  Anesthesia Type:MAC  Level of Consciousness: drowsy  Airway & Oxygen Therapy: Patient Spontanous Breathing and Patient connected to nasal cannula oxygen  Post-op Assessment: Report given to RN and Post -op Vital signs reviewed and stable  Post vital signs: Reviewed and stable  Last Vitals:  Vitals Value Taken Time  BP 159/86 04/04/24 0750  Temp 36.4 C 04/04/24 0749  Pulse 74 04/04/24 0750  Resp 16 04/04/24 0750  SpO2 94 % 04/04/24 0750  Vitals shown include unfiled device data.  Last Pain:  Vitals:   04/04/24 0749  TempSrc: Tympanic  PainSc: Asleep         Complications: No notable events documented.
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
## 2024-04-04 NOTE — Op Note (Signed)
 [facility] Patient Name: Angela [name] Procedure Date: 04/04/2024 MRN: [id number] Attending MD: Lynann Bologna , MD, [id number] Date of Birth: [birth year] CSN: [id number] Age: 38 Admit Type: Outpatient Procedure:                Upper GI endoscopy Indications:              Epigastric abdominal pain. The onset liver MASH                            cirrhosis-for screening of esophageal varices Providers:                Lynann Bologna, MD, Fransisca Connors, Geoffery Lyons,                            Technician Referring MD:              Medicines:                Monitored Anesthesia Care Complications:            No immediate complications. Estimated Blood Loss:     Estimated blood loss: none. Procedure:                Pre-Anesthesia Assessment:                           - Prior to the procedure, a History and Physical                            was performed, and patient medications and                            allergies were reviewed. The patient's tolerance of                            previous anesthesia was also reviewed. The risks                            and benefits of the procedure and the sedation                            options and risks were discussed with the patient.                            All questions were answered, and informed consent                            was obtained. Prior Anticoagulants: The patient has                            taken no anticoagulant or antiplatelet agents. ASA                            Grade Assessment: III - A patient with severe  systemic disease. After reviewing the risks and                            benefits, the patient was deemed in satisfactory                            condition to undergo the procedure.                           After obtaining informed consent, the endoscope was                            passed under direct vision. Throughout the                             procedure, the patient's blood pressure, pulse, and                            oxygen saturations were monitored continuously. The                            [id number]-[id number] ([id number]) Olympus endoscope was introduced                            through the mouth, and advanced to the second part                            of duodenum. The upper GI endoscopy was                            accomplished without difficulty. The patient                            tolerated the procedure well. Scope In: Scope Out: Findings:      The examined esophagus was normal. No esophageal varices.      The Z-line was regular and was found 36 cm from the incisors.      Localized minimal inflammation characterized by erythema was found in       the gastric antrum. Biopsies were taken with a cold forceps for       histology to rule out HP.      Small amount of food (residue) was found in the gastric body. No outlet       obstruction. The retroflexed examination of the cardia was normal. No       fundal varices.      The examined duodenum was normal. Impression:               - Minimal gastritis. Biopsied.                           - Food (residue) in the stomach without outlet                            obstruction c/w previous history of gastroparesis                           -  No esophageal or fundal varices. Moderate Sedation:      Not Applicable - Patient had care per Anesthesia. Recommendation:           - Patient has a contact number available for                            emergencies. The signs and symptoms of potential                            delayed complications were discussed with the                            patient. Return to normal activities tomorrow.                            Written discharge instructions were provided to the                            patient.                           - Resume previous low-salt diet.                           - Continue present medications including  omeprazole.                           - Avoid nonsteroidals except bASA.                           - Continue Coreg 6.25 mg p.o. twice daily                           - No reason to repeat EGD unless any new problems                           - Would also get appointment from [facility] as                            outpt to determine if she is a liver transplant                            candidate. She was previously deemed not to be a                            candidate for renal transplant d/t CVA. She is                            currently doing much better.                           - Await pathology results.                           - The findings and recommendations  were discussed                            with the patient's family. Procedure Code(s):        --- Professional ---                           [phone], Esophagogastroduodenoscopy, flexible,                            transoral; with biopsy, single or multiple Diagnosis Code(s):        --- Professional ---                           K29.70, Gastritis, unspecified, without bleeding                           R10.13, Epigastric pain CPT copyright 2022 American Medical Association. All rights reserved. The codes documented in this report are preliminary and upon coder review may  be revised to meet current compliance requirements. Lajuan Pila, MD 04/04/2024 7:50:44 AM This report has been signed electronically. Number of Addenda: 0

## 2024-04-04 NOTE — Anesthesia Postprocedure Evaluation (Signed)
 Anesthesia Post Note  Patient: Angela [name]  Procedure(s) Performed: ESOPHAGOGASTRODUODENOSCOPY (EGD) WITH PROPOFOL     Patient location during evaluation: PACU Anesthesia Type: MAC Level of consciousness: awake and alert Pain management: pain level controlled Vital Signs Assessment: post-procedure vital signs reviewed and stable Respiratory status: spontaneous breathing, nonlabored ventilation, respiratory function stable and patient connected to nasal cannula oxygen Cardiovascular status: stable and blood pressure returned to baseline Postop Assessment: no apparent nausea or vomiting Anesthetic complications: no   No notable events documented.  Last Vitals:  Vitals:   04/04/24 0759 04/04/24 0809  BP: (!) 164/88 (!) 171/94  Pulse: 75 76  Resp: 19 17  Temp:    SpO2: 98% 96%    Last Pain:  Vitals:   04/04/24 0809  TempSrc:   PainSc: 0-No pain                 Lethaniel Rave

## 2024-04-04 NOTE — Discharge Instructions (Signed)

## 2024-04-05 ENCOUNTER — Encounter (HOSPITAL_COMMUNITY): Payer: Self-pay | Admitting: Gastroenterology

## 2024-04-07 ENCOUNTER — Encounter: Payer: Self-pay | Admitting: Internal Medicine

## 2024-04-08 LAB — SURGICAL PATHOLOGY

## 2024-04-10 ENCOUNTER — Other Ambulatory Visit: Payer: Self-pay | Admitting: Internal Medicine

## 2024-04-14 ENCOUNTER — Encounter: Payer: Self-pay | Admitting: Gastroenterology

## 2024-04-23 ENCOUNTER — Other Ambulatory Visit: Payer: Self-pay | Admitting: Internal Medicine

## 2024-04-30 ENCOUNTER — Ambulatory Visit: Payer: Self-pay

## 2024-05-07 ENCOUNTER — Ambulatory Visit (INDEPENDENT_AMBULATORY_CARE_PROVIDER_SITE_OTHER): Payer: MEDICAID

## 2024-05-07 DIAGNOSIS — Z23 Encounter for immunization: Secondary | ICD-10-CM | POA: Diagnosis not present

## 2024-05-07 NOTE — Progress Notes (Signed)
 Pt came into the office today for Hep A vaccine

## 2024-05-07 NOTE — Patient Instructions (Signed)
 Hepatitis A; Hepatitis B Vaccine Injection What is this medication? HEPATITIS A; HEPATITIS B VACCINE (hep uh TAHY tis A; hep uh TAHY tis B vak SEEN) reduces the risk of hepatitis A and B. It does not treat hepatitis A or B. It is still possible to get hepatitis A or B after receiving this vaccine, but the symptoms may be less severe or not last as long. It works by helping your immune system learn how to fight off a future infection. This medicine may be used for other purposes; ask your health care provider or pharmacist if you have questions. COMMON BRAND NAME(S): Twinrix What should I tell my care team before I take this medication? They need to know if you have any of these conditions: Bleeding disorder Fever or infection Heart disease Immune system problems An unusual or allergic reaction to hepatitis A or B vaccine, other medications, foods, dyes, or preservatives Pregnant or trying to get pregnant Breastfeeding How should I use this medication? This vaccine is injected into a muscle. It is given by your care team. A copy of Vaccine Information Statements will be given before each vaccination. Be sure to read this information carefully each time. This sheet may change often. Talk to your care team about the use of this medication in children. Special care may be needed. Overdosage: If you think you have taken too much of this medicine contact a poison control center or emergency room at once. NOTE: This medicine is only for you. Do not share this medicine with others. What if I miss a dose? Keep appointments for follow-up (booster) doses as directed. It is important not to miss your dose. Call your care team if you are unable to keep an appointment. What may interact with this medication? Certain medications that suppress your immune function, such as adalimumab, anakinra, infliximab Certain medications to treat cancer Steroid medications, such as prednisone or cortisone This list may  not describe all possible interactions. Give your health care provider a list of all the medicines, herbs, non-prescription drugs, or dietary supplements you use. Also tell them if you smoke, drink alcohol, or use illegal drugs. Some items may interact with your medicine. What should I watch for while using this medication? See your care team for all shots of this vaccine as directed. You must have 3 to 4 shots of this vaccine for protection from hepatitis A and B infection. Tell your care team right away if you have any serious or unusual side effects after getting this vaccine. What side effects may I notice from receiving this medication? Side effects that you should report to your care team as soon as possible: Allergic reactions--skin rash, itching, hives, swelling of the face, lips, tongue, or throat Burning or tingling sensation in hands or feet Feeling faint or lightheaded Side effects that usually do not require medical attention (report these to your care team if they continue or are bothersome): Fatigue Headache Pain, redness, or irritation at injection site This list may not describe all possible side effects. Call your doctor for medical advice about side effects. You may report side effects to FDA at 1-800-FDA-1088. Where should I keep my medication? This vaccine is only given by your care team. It will not be stored at home. NOTE: This sheet is a summary. It may not cover all possible information. If you have questions about this medicine, talk to your doctor, pharmacist, or health care provider.  2024 Elsevier/Gold Standard (2022-05-17 00:00:00)

## 2024-05-16 ENCOUNTER — Other Ambulatory Visit: Payer: Self-pay | Admitting: Internal Medicine

## 2024-05-22 ENCOUNTER — Other Ambulatory Visit: Payer: Self-pay | Admitting: Internal Medicine

## 2024-05-22 DIAGNOSIS — R109 Unspecified abdominal pain: Secondary | ICD-10-CM

## 2024-05-23 ENCOUNTER — Ambulatory Visit
Admission: RE | Admit: 2024-05-23 | Discharge: 2024-05-23 | Discharge status: Home / Self Care | Payer: MEDICAID | Source: Ambulatory Visit | Attending: Internal Medicine | Admitting: Internal Medicine

## 2024-05-23 ENCOUNTER — Encounter: Payer: Self-pay | Admitting: Internal Medicine

## 2024-05-23 DIAGNOSIS — R109 Unspecified abdominal pain: Secondary | ICD-10-CM

## 2024-07-05 ENCOUNTER — Telehealth: Payer: Self-pay | Admitting: Internal Medicine

## 2024-07-05 ENCOUNTER — Ambulatory Visit: Payer: Self-pay | Admitting: Internal Medicine

## 2024-07-05 NOTE — Telephone Encounter (Signed)
 Copied from CRM [id number]. Topic: Appointments - Scheduling Inquiry for Clinic >> Jul 05, 2024  9:51 AM Willma SAUNDERS wrote:  Reason for CRM: Nena called about patients missed appointment this morning. Had dialysis that ran late and was unable to make it. Scheduled her the next available day in September and placed her on the wait list, but asked if a message could be sent to see if anything can be done to get her in sooner.  Nena can be reached at [phone]

## 2024-07-05 NOTE — Telephone Encounter (Signed)
 Noted, patient has been added on the wait list for any cancellations.

## 2024-07-05 NOTE — Telephone Encounter (Signed)
 Called but no answer. Please schedule patient for a sooner appointment if possible at any of the double book slots.

## 2024-07-09 NOTE — Telephone Encounter (Signed)
 Scheduled an appointment with PCP.  07/18/2024 at 2:10.  Patient and patient mother aware of appointment.

## 2024-07-17 ENCOUNTER — Telehealth: Payer: Self-pay | Admitting: Internal Medicine

## 2024-07-17 NOTE — Telephone Encounter (Signed)
 Called pt to confirm appr for 7/31 unable to LVM

## 2024-07-18 ENCOUNTER — Other Ambulatory Visit: Payer: Self-pay

## 2024-07-18 ENCOUNTER — Encounter: Payer: Self-pay | Admitting: Internal Medicine

## 2024-07-18 ENCOUNTER — Emergency Department (HOSPITAL_COMMUNITY): Payer: MEDICAID

## 2024-07-18 ENCOUNTER — Ambulatory Visit: Payer: MEDICAID | Attending: Internal Medicine | Admitting: Internal Medicine

## 2024-07-18 ENCOUNTER — Encounter (HOSPITAL_COMMUNITY): Payer: Self-pay

## 2024-07-18 ENCOUNTER — Inpatient Hospital Stay (HOSPITAL_COMMUNITY)
Admission: EM | Admit: 2024-07-18 | Discharge: 2024-07-21 | DRG: 640 | Disposition: A | Discharge status: Home / Self Care | Payer: MEDICAID | Attending: Internal Medicine | Admitting: Internal Medicine

## 2024-07-18 VITALS — BP 130/80 | HR 75 | Temp 98.3°F | Ht 64.0 in | Wt 132.0 lb

## 2024-07-18 DIAGNOSIS — Z820 Family history of epilepsy and other diseases of the nervous system: Secondary | ICD-10-CM

## 2024-07-18 DIAGNOSIS — E103593 Type 1 diabetes mellitus with proliferative diabetic retinopathy without macular edema, bilateral: Secondary | ICD-10-CM | POA: Diagnosis present

## 2024-07-18 DIAGNOSIS — E1159 Type 2 diabetes mellitus with other circulatory complications: Secondary | ICD-10-CM | POA: Diagnosis present

## 2024-07-18 DIAGNOSIS — Z79899 Other long term (current) drug therapy: Secondary | ICD-10-CM

## 2024-07-18 DIAGNOSIS — Z9641 Presence of insulin pump (external) (internal): Secondary | ICD-10-CM | POA: Diagnosis present

## 2024-07-18 DIAGNOSIS — F32A Depression, unspecified: Secondary | ICD-10-CM | POA: Diagnosis present

## 2024-07-18 DIAGNOSIS — N2581 Secondary hyperparathyroidism of renal origin: Secondary | ICD-10-CM | POA: Diagnosis present

## 2024-07-18 DIAGNOSIS — I871 Compression of vein: Secondary | ICD-10-CM | POA: Diagnosis present

## 2024-07-18 DIAGNOSIS — E875 Hyperkalemia: Secondary | ICD-10-CM | POA: Diagnosis present

## 2024-07-18 DIAGNOSIS — E10319 Type 1 diabetes mellitus with unspecified diabetic retinopathy without macular edema: Secondary | ICD-10-CM | POA: Diagnosis present

## 2024-07-18 DIAGNOSIS — Z8249 Family history of ischemic heart disease and other diseases of the circulatory system: Secondary | ICD-10-CM

## 2024-07-18 DIAGNOSIS — I69151 Hemiplegia and hemiparesis following nontraumatic intracerebral hemorrhage affecting right dominant side: Secondary | ICD-10-CM

## 2024-07-18 DIAGNOSIS — R0609 Other forms of dyspnea: Secondary | ICD-10-CM

## 2024-07-18 DIAGNOSIS — Z79890 Hormone replacement therapy (postmenopausal): Secondary | ICD-10-CM

## 2024-07-18 DIAGNOSIS — J45909 Unspecified asthma, uncomplicated: Secondary | ICD-10-CM | POA: Diagnosis present

## 2024-07-18 DIAGNOSIS — Z833 Family history of diabetes mellitus: Secondary | ICD-10-CM

## 2024-07-18 DIAGNOSIS — E1040 Type 1 diabetes mellitus with diabetic neuropathy, unspecified: Secondary | ICD-10-CM | POA: Diagnosis present

## 2024-07-18 DIAGNOSIS — E1022 Type 1 diabetes mellitus with diabetic chronic kidney disease: Secondary | ICD-10-CM | POA: Diagnosis present

## 2024-07-18 DIAGNOSIS — Z794 Long term (current) use of insulin: Secondary | ICD-10-CM

## 2024-07-18 DIAGNOSIS — Z992 Dependence on renal dialysis: Secondary | ICD-10-CM

## 2024-07-18 DIAGNOSIS — I152 Hypertension secondary to endocrine disorders: Secondary | ICD-10-CM | POA: Diagnosis present

## 2024-07-18 DIAGNOSIS — R0902 Hypoxemia: Secondary | ICD-10-CM

## 2024-07-18 DIAGNOSIS — Z803 Family history of malignant neoplasm of breast: Secondary | ICD-10-CM

## 2024-07-18 DIAGNOSIS — I120 Hypertensive chronic kidney disease with stage 5 chronic kidney disease or end stage renal disease: Secondary | ICD-10-CM | POA: Diagnosis present

## 2024-07-18 DIAGNOSIS — E039 Hypothyroidism, unspecified: Secondary | ICD-10-CM | POA: Diagnosis present

## 2024-07-18 DIAGNOSIS — D631 Anemia in chronic kidney disease: Secondary | ICD-10-CM | POA: Diagnosis present

## 2024-07-18 DIAGNOSIS — N186 End stage renal disease: Secondary | ICD-10-CM

## 2024-07-18 DIAGNOSIS — J9601 Acute respiratory failure with hypoxia: Secondary | ICD-10-CM | POA: Diagnosis present

## 2024-07-18 DIAGNOSIS — Z8349 Family history of other endocrine, nutritional and metabolic diseases: Secondary | ICD-10-CM

## 2024-07-18 DIAGNOSIS — E1029 Type 1 diabetes mellitus with other diabetic kidney complication: Secondary | ICD-10-CM

## 2024-07-18 DIAGNOSIS — F419 Anxiety disorder, unspecified: Secondary | ICD-10-CM | POA: Diagnosis present

## 2024-07-18 DIAGNOSIS — Z888 Allergy status to other drugs, medicaments and biological substances status: Secondary | ICD-10-CM

## 2024-07-18 DIAGNOSIS — Z823 Family history of stroke: Secondary | ICD-10-CM

## 2024-07-18 DIAGNOSIS — G40909 Epilepsy, unspecified, not intractable, without status epilepticus: Secondary | ICD-10-CM | POA: Diagnosis present

## 2024-07-18 DIAGNOSIS — Z87891 Personal history of nicotine dependence: Secondary | ICD-10-CM

## 2024-07-18 DIAGNOSIS — H35033 Hypertensive retinopathy, bilateral: Secondary | ICD-10-CM | POA: Diagnosis present

## 2024-07-18 DIAGNOSIS — E782 Mixed hyperlipidemia: Secondary | ICD-10-CM | POA: Diagnosis present

## 2024-07-18 DIAGNOSIS — E8770 Fluid overload, unspecified: Principal | ICD-10-CM | POA: Diagnosis present

## 2024-07-18 DIAGNOSIS — E1059 Type 1 diabetes mellitus with other circulatory complications: Secondary | ICD-10-CM | POA: Diagnosis present

## 2024-07-18 DIAGNOSIS — Z882 Allergy status to sulfonamides status: Secondary | ICD-10-CM

## 2024-07-18 DIAGNOSIS — E1065 Type 1 diabetes mellitus with hyperglycemia: Secondary | ICD-10-CM | POA: Diagnosis present

## 2024-07-18 DIAGNOSIS — Z881 Allergy status to other antibiotic agents status: Secondary | ICD-10-CM

## 2024-07-18 LAB — CBC WITH DIFFERENTIAL/PLATELET
Abs Immature Granulocytes: 0.06 K/uL (ref 0.00–0.07)
Basophils Absolute: 0.1 K/uL (ref 0.0–0.1)
Basophils Relative: 1 %
Eosinophils Absolute: 0.4 K/uL (ref 0.0–0.5)
Eosinophils Relative: 6 %
HCT: 38 % (ref 36.0–46.0)
Hemoglobin: 11.5 g/dL — ABNORMAL LOW (ref 12.0–15.0)
Immature Granulocytes: 1 %
Lymphocytes Relative: 20 %
Lymphs Abs: 1.4 K/uL (ref 0.7–4.0)
MCH: 27.5 pg (ref 26.0–34.0)
MCHC: 30.3 g/dL (ref 30.0–36.0)
MCV: 90.9 fL (ref 80.0–100.0)
Monocytes Absolute: 0.7 K/uL (ref 0.1–1.0)
Monocytes Relative: 9 %
Neutro Abs: 4.6 K/uL (ref 1.7–7.7)
Neutrophils Relative %: 63 %
Platelets: 166 K/uL (ref 150–400)
RBC: 4.18 MIL/uL (ref 3.87–5.11)
RDW: 19.9 % — ABNORMAL HIGH (ref 11.5–15.5)
WBC: 7.2 K/uL (ref 4.0–10.5)
nRBC: 0 % (ref 0.0–0.2)

## 2024-07-18 LAB — COMPREHENSIVE METABOLIC PANEL WITH GFR
ALT: 17 U/L (ref 0–44)
AST: 21 U/L (ref 15–41)
Albumin: 4.1 g/dL (ref 3.5–5.0)
Alkaline Phosphatase: 112 U/L (ref 38–126)
Anion gap: 15 (ref 5–15)
BUN: 32 mg/dL — ABNORMAL HIGH (ref 6–20)
CO2: 27 mmol/L (ref 22–32)
Calcium: 10.4 mg/dL — ABNORMAL HIGH (ref 8.9–10.3)
Chloride: 92 mmol/L — ABNORMAL LOW (ref 98–111)
Creatinine, Ser: 7.32 mg/dL — ABNORMAL HIGH (ref 0.44–1.00)
GFR, Estimated: 7 mL/min — ABNORMAL LOW (ref >60)
Glucose, Bld: 230 mg/dL — ABNORMAL HIGH (ref 70–99)
Potassium: 5.4 mmol/L — ABNORMAL HIGH (ref 3.5–5.1)
Sodium: 134 mmol/L — ABNORMAL LOW (ref 135–145)
Total Bilirubin: 0.8 mg/dL (ref 0.0–1.2)
Total Protein: 7.2 g/dL (ref 6.5–8.1)

## 2024-07-18 LAB — HEPATITIS B SURFACE ANTIGEN: Hepatitis B Surface Ag: NONREACTIVE

## 2024-07-18 LAB — HCG, SERUM, QUALITATIVE: Preg, Serum: NEGATIVE

## 2024-07-18 MED ORDER — HEPARIN SODIUM (PORCINE) 5000 UNIT/ML IJ SOLN
5000.0000 [IU] | Freq: Three times a day (TID) | INTRAMUSCULAR | Status: DC
  Administered 2024-07-18 – 2024-07-20 (×5): 5000 [IU] via SUBCUTANEOUS
  Filled 2024-07-18 (×7): qty 1

## 2024-07-18 MED ORDER — ACETAMINOPHEN 325 MG PO TABS
650.0000 mg | ORAL_TABLET | Freq: Four times a day (QID) | ORAL | Status: DC | PRN
  Administered 2024-07-19: 650 mg via ORAL
  Filled 2024-07-18: qty 2

## 2024-07-18 MED ORDER — ONDANSETRON HCL 4 MG/2ML IJ SOLN: 4.0000 mg | Freq: Four times a day (QID) | INTRAMUSCULAR | Status: DC | PRN

## 2024-07-18 MED ORDER — SENNOSIDES-DOCUSATE SODIUM 8.6-50 MG PO TABS: 1.0000 | ORAL_TABLET | Freq: Every evening | ORAL | Status: DC | PRN

## 2024-07-18 MED ORDER — ONDANSETRON HCL 4 MG PO TABS: 4.0000 mg | ORAL_TABLET | Freq: Four times a day (QID) | ORAL | Status: DC | PRN

## 2024-07-18 MED ORDER — PANTOPRAZOLE SODIUM 40 MG PO TBEC
40.0000 mg | DELAYED_RELEASE_TABLET | Freq: Every day | ORAL | Status: DC
Start: 2024-07-19 — End: 2024-07-21
  Administered 2024-07-19 – 2024-07-21 (×3): 40 mg via ORAL
  Filled 2024-07-18 (×3): qty 1

## 2024-07-18 MED ORDER — AMLODIPINE BESYLATE 10 MG PO TABS
10.0000 mg | ORAL_TABLET | Freq: Every day | ORAL | Status: DC
  Administered 2024-07-18 – 2024-07-20 (×3): 10 mg via ORAL
  Filled 2024-07-18 (×2): qty 1
  Filled 2024-07-18: qty 2

## 2024-07-18 MED ORDER — INSULIN PUMP
Freq: Three times a day (TID) | SUBCUTANEOUS | Status: DC
  Administered 2024-07-20: 2 via SUBCUTANEOUS
  Filled 2024-07-18: qty 1

## 2024-07-18 MED ORDER — IOHEXOL 350 MG/ML SOLN
60.0000 mL | Freq: Once | INTRAVENOUS | Status: AC | PRN
  Administered 2024-07-18: 60 mL via INTRAVENOUS

## 2024-07-18 MED ORDER — ATORVASTATIN CALCIUM 40 MG PO TABS
40.0000 mg | ORAL_TABLET | Freq: Every day | ORAL | Status: DC
  Administered 2024-07-18 – 2024-07-20 (×3): 40 mg via ORAL
  Filled 2024-07-18 (×3): qty 1

## 2024-07-18 MED ORDER — CARVEDILOL 6.25 MG PO TABS
6.2500 mg | ORAL_TABLET | Freq: Two times a day (BID) | ORAL | Status: DC
  Administered 2024-07-19 – 2024-07-21 (×4): 6.25 mg via ORAL
  Filled 2024-07-18: qty 1
  Filled 2024-07-18: qty 2
  Filled 2024-07-18: qty 1
  Filled 2024-07-18: qty 2

## 2024-07-18 MED ORDER — CHLORHEXIDINE GLUCONATE CLOTH 2 % EX PADS: 6.0000 | MEDICATED_PAD | Freq: Every day | CUTANEOUS | Status: DC

## 2024-07-18 MED ORDER — METOCLOPRAMIDE HCL 10 MG PO TABS
5.0000 mg | ORAL_TABLET | Freq: Two times a day (BID) | ORAL | Status: DC
  Administered 2024-07-19 – 2024-07-21 (×4): 5 mg via ORAL
  Filled 2024-07-18 (×4): qty 1

## 2024-07-18 MED ORDER — GABAPENTIN 100 MG PO CAPS
100.0000 mg | ORAL_CAPSULE | Freq: Three times a day (TID) | ORAL | Status: DC
  Administered 2024-07-18 – 2024-07-21 (×7): 100 mg via ORAL
  Filled 2024-07-18 (×7): qty 1

## 2024-07-18 MED ORDER — ESCITALOPRAM OXALATE 20 MG PO TABS
20.0000 mg | ORAL_TABLET | Freq: Every day | ORAL | Status: DC
  Administered 2024-07-19 – 2024-07-21 (×3): 20 mg via ORAL
  Filled 2024-07-18 (×2): qty 1
  Filled 2024-07-18: qty 2

## 2024-07-18 MED ORDER — DILTIAZEM HCL 60 MG PO TABS: 60.0000 mg | ORAL_TABLET | Freq: Four times a day (QID) | ORAL | Status: DC

## 2024-07-18 MED ORDER — HYDRALAZINE HCL 50 MG PO TABS
100.0000 mg | ORAL_TABLET | Freq: Three times a day (TID) | ORAL | Status: DC
  Administered 2024-07-19 – 2024-07-21 (×7): 100 mg via ORAL
  Filled 2024-07-18 (×7): qty 2

## 2024-07-18 MED ORDER — ALBUTEROL SULFATE (2.5 MG/3ML) 0.083% IN NEBU: 2.5000 mg | INHALATION_SOLUTION | Freq: Four times a day (QID) | RESPIRATORY_TRACT | Status: DC | PRN

## 2024-07-18 MED ORDER — SEVELAMER CARBONATE 800 MG PO TABS
1600.0000 mg | ORAL_TABLET | Freq: Three times a day (TID) | ORAL | Status: DC
  Administered 2024-07-19 – 2024-07-21 (×6): 1600 mg via ORAL
  Filled 2024-07-18 (×6): qty 2

## 2024-07-18 MED ORDER — SODIUM ZIRCONIUM CYCLOSILICATE 5 G PO PACK
5.0000 g | PACK | Freq: Every day | ORAL | Status: DC
  Administered 2024-07-20 – 2024-07-21 (×2): 5 g via ORAL
  Filled 2024-07-18 (×3): qty 1

## 2024-07-18 MED ORDER — SODIUM CHLORIDE 0.9% FLUSH
3.0000 mL | Freq: Two times a day (BID) | INTRAVENOUS | Status: DC
  Administered 2024-07-18 – 2024-07-21 (×5): 3 mL via INTRAVENOUS

## 2024-07-18 MED ORDER — ACETAMINOPHEN 650 MG RE SUPP: 650.0000 mg | Freq: Four times a day (QID) | RECTAL | Status: DC | PRN

## 2024-07-18 MED ORDER — LEVETIRACETAM 250 MG PO TABS
500.0000 mg | ORAL_TABLET | Freq: Two times a day (BID) | ORAL | Status: DC
Start: 2024-07-18 — End: 2024-07-21
  Administered 2024-07-18 – 2024-07-21 (×6): 500 mg via ORAL
  Filled 2024-07-18: qty 2
  Filled 2024-07-18 (×2): qty 1
  Filled 2024-07-18 (×3): qty 2

## 2024-07-18 MED ORDER — SODIUM ZIRCONIUM CYCLOSILICATE 10 G PO PACK
10.0000 g | PACK | Freq: Once | ORAL | Status: AC
  Administered 2024-07-18: 10 g via ORAL
  Filled 2024-07-18: qty 1

## 2024-07-18 MED ORDER — LEVOTHYROXINE SODIUM 100 MCG PO TABS
200.0000 ug | ORAL_TABLET | Freq: Every day | ORAL | Status: DC
  Administered 2024-07-19 – 2024-07-21 (×3): 200 ug via ORAL
  Filled 2024-07-18 (×3): qty 2

## 2024-07-18 MED ORDER — BUSPIRONE HCL 5 MG PO TABS
15.0000 mg | ORAL_TABLET | Freq: Every day | ORAL | Status: DC
  Administered 2024-07-18 – 2024-07-20 (×3): 15 mg via ORAL
  Filled 2024-07-18 (×2): qty 3
  Filled 2024-07-18: qty 2

## 2024-07-18 NOTE — ED Provider Notes (Signed)
 [place] EMERGENCY DEPARTMENT AT [facility] Provider Note   CSN: [id number] Arrival date & time: 07/18/24  1547     Patient presents with: Shortness of Breath   Angela [name] is a 38 y.o. female.   38 year old female with past medical history of CVA and end-stage renal disease on dialysis Mondays, Wednesdays, and Fridays presenting to the emergency department today with hypoxia.  The patient followed up with her primary care doctor today for routine follow-up.  She was found to be hypoxic.  The patient was placed on nasal cannula oxygen and was sent to the emergency department to rule out pulmonary embolism.  The patient states that over the past few days that she has been a few kilos higher than her dry weight after dialysis.  Has not missed any dialysis sessions.  She denies any real shortness of breath but states she does have some mild dyspnea on exertion.  It sounds like this has been going on for quite some time.  When she was in rehabilitation after a stroke earlier this year she did have some dyspnea on exertion and was told that her oxygen saturations were little low with ambulation but this apparently resolved.  The patient does have asthma but denies any wheezing.  She denies any cough.  Denies any fevers.   Shortness of Breath      Prior to Admission medications   Medication Sig Start Date End Date Taking? Authorizing Provider  albuterol  (PROVENTIL  HFA) 108 (90 Base) MCG/ACT inhaler Inhale 2 puffs into the lungs every 6 (six) hours as needed for wheezing or shortness of breath. 08/19/21   Vicci Barnie NOVAK, MD  amLODipine  (NORVASC ) 10 MG tablet Take 1 tablet (10 mg total) by mouth at bedtime. 09/17/23   Vicci Barnie NOVAK, MD  atorvastatin  (LIPITOR) 40 MG tablet Take 1 tablet by mouth at bedtime. 03/12/24 03/12/25  [provider]  B Complex-C-Folic Acid  (DIALYVITE TABLET) TABS Take 1 tablet by mouth daily. 09/26/23   [provider]  busPIRone   (BUSPAR ) 15 MG tablet Take 1 tablet (15 mg total) by mouth at bedtime. 09/17/23   Vicci Barnie NOVAK, MD  carvedilol  (COREG ) 6.25 MG tablet Take 1 tablet (6.25 mg total) by mouth 2 (two) times daily with a meal. 09/17/23   Vicci Barnie NOVAK, MD  cinacalcet  (SENSIPAR ) 30 MG tablet Take 30 mg by mouth every evening. 09/19/23   [provider]  Darbepoetin Alfa  (ARANESP , ALBUMIN  FREE,) 100 MCG/0.5ML SOSY injection Inject 0.5 mLs (100 mcg total) into the skin every Friday at 6 PM.Strength: 100 MCG/0.5ML 10/31/23   Vicci Barnie NOVAK, MD  diltiazem  (CARDIZEM ) 60 MG tablet TAKE 1 TABLET(60 MG) BY MOUTH EVERY 6 HOURS 04/23/24   Vicci Barnie NOVAK, MD  escitalopram  (LEXAPRO ) 20 MG tablet Take 1 tablet (20 mg total) by mouth daily. 09/17/23   Vicci Barnie NOVAK, MD  gabapentin  (NEURONTIN ) 100 MG capsule TAKE 1 CAPSULE(100 MG) BY MOUTH THREE TIMES DAILY 04/01/24   Vicci Barnie NOVAK, MD  hydrALAZINE  (APRESOLINE ) 100 MG tablet TAKE 1 TABLET(100 MG) BY MOUTH THREE TIMES DAILY 05/16/24   Vicci Barnie NOVAK, MD  insulin  aspart (NOVOLOG  FLEXPEN) 100 UNIT/ML FlexPen Inject 2-3 Units into the skin 3 (three) times daily with meals. Per sliding scale 09/17/23   Vicci Barnie NOVAK, MD  Insulin  Pen Needle (PEN NEEDLES) 31G X 8 MM MISC UAD 03/05/24   Vicci Barnie NOVAK, MD  levETIRAcetam  (KEPPRA ) 500 MG tablet Take 1 tablet (500 mg total) by  mouth 2 (two) times daily. With 250mg (1/2 tab) supplement after dialysis. 09/17/23   Vicci Barnie NOVAK, MD  levothyroxine  (SYNTHROID ) 200 MCG tablet TAKE 1 TABLET(200 MCG) BY MOUTH DAILY BEFORE BREAKFAST. STOP THE 175 MCG DOSE 01/29/24   Vicci Barnie NOVAK, MD  LOKELMA  5 g packet Take 1 packet by mouth daily. 07/28/23   [provider]  metoCLOPramide  (REGLAN ) 5 MG tablet Take 1 tablet (5 mg total) by mouth 2 (two) times daily with a meal. 03/05/24   Vicci Barnie NOVAK, MD  ondansetron  (ZOFRAN -ODT) 8 MG disintegrating tablet Take 1 tablet (8 mg total) by mouth every 8 (eight) hours  as needed for nausea or vomiting. 09/19/23   Ines Onetha NOVAK, MD  pantoprazole  (PROTONIX ) 40 MG tablet TAKE 1 TABLET(40 MG) BY MOUTH DAILY 04/10/24   Vicci Barnie NOVAK, MD  RENVELA  800 MG tablet Take 1,600 mg by mouth 3 (three) times daily. 09/25/23   [provider]  TRESIBA  FLEXTOUCH 100 UNIT/ML FlexTouch Pen Inject 5 Units into the skin 2 (two) times daily. 09/17/23   Vicci Barnie NOVAK, MD    Allergies: Iron, Zestril  [lisinopril ], Bactrim [sulfamethoxazole-trimethoprim], and Feraheme [ferumoxytol]    Review of Systems  Respiratory:  Positive for shortness of breath.   All other systems reviewed and are negative.   Updated Vital Signs BP (!) 161/79   Pulse 79   Temp 98.3 F (36.8 C) (Oral)   Resp 17   Ht 5' 4.5 (1.638 m)   Wt 60.8 kg   SpO2 100%   BMI 22.65 kg/m   Physical Exam Vitals and nursing note reviewed.   Gen: NAD Eyes: PERRL, EOMI HEENT: no oropharyngeal swelling Neck: trachea midline Resp: Diminished at bilateral lung bases Card: RRR, no murmurs, rubs, or gallops Abd: nontender, nondistended Extremities: no calf tenderness, no edema Vascular: 2+ radial pulses bilaterally, 2+ DP pulses bilaterally Skin: no rashes Psyc: acting appropriately   (all labs ordered are listed, but only abnormal results are displayed) Labs Reviewed  CBC WITH DIFFERENTIAL/PLATELET - Abnormal; Notable for the following components:      Result Value   Hemoglobin 11.5 (*)    RDW 19.9 (*)    All other components within normal limits  COMPREHENSIVE METABOLIC PANEL WITH GFR - Abnormal; Notable for the following components:   Sodium 134 (*)    Potassium 5.4 (*)    Chloride 92 (*)    Glucose, Bld 230 (*)    BUN 32 (*)    Creatinine, Ser 7.32 (*)    Calcium  10.4 (*)    GFR, Estimated 7 (*)    All other components within normal limits  HCG, SERUM, QUALITATIVE    EKG: None  Radiology: CT Angio Chest PE W and/or Wo Contrast Result Date: 07/18/2024 CLINICAL DATA:   Shortness of breath EXAM: CT ANGIOGRAPHY CHEST WITH CONTRAST TECHNIQUE: Multidetector CT imaging of the chest was performed using the standard protocol during bolus administration of intravenous contrast. Multiplanar CT image reconstructions and MIPs were obtained to evaluate the vascular anatomy. RADIATION DOSE REDUCTION: This exam was performed according to the departmental dose-optimization program which includes automated exposure control, adjustment of the mA and/or kV according to patient size and/or use of iterative reconstruction technique. CONTRAST:  60mL OMNIPAQUE  IOHEXOL  350 MG/ML SOLN COMPARISON:  Chest x-ray from earlier in the same day. FINDINGS: Cardiovascular: Thoracic aorta and its branches are within normal limits. The heart is mildly enlarged in size. The pulmonary artery shows a normal branching pattern bilaterally. No intraluminal  filling defect to suggest pulmonary embolism is seen. Multiple venous collaterals are noted in the right chest and neck consistent with a degree of right subclavian vein stenosis. Mediastinum/Nodes: Thoracic inlet is within normal limits. No hilar or mediastinal adenopathy is noted. The esophagus as visualized is within normal limits. Lungs/Pleura: Lungs are well aerated bilaterally. No focal infiltrate or sizable effusion is seen. No parenchymal nodule is noted. Mild scarring is noted in the left base. Upper Abdomen: Visualized upper abdomen is unremarkable. Musculoskeletal: No acute bony abnormality is noted. Review of the MIP images confirms the above findings. IMPRESSION: No evidence of pulmonary emboli. 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 No other focal abnormality is noted. Electronically Signed   By: Oneil Devonshire M.D.   On: 07/18/2024 19:59   DG Chest 2 View Result Date: 07/18/2024 CLINICAL DATA:  Shortness of breath EXAM: CHEST - 2 VIEW COMPARISON:  Chest radiograph October 09, 2023 FINDINGS:  Enlarged cardiomediastinal silhouette, similar to prior. No suspicious pulmonary nodule or consolidation. Previously seen patchy pulmonary opacities and pleural effusion is resolved. Right posterior upper ribs healed fractures. IMPRESSION: Cardiomegaly. Mild vascular congestion without significant opacity/airspace consolidation. Healed rib fractures. Electronically Signed   By: Megan  Zare M.D.   On: 07/18/2024 17:19     Procedures   Medications Ordered in the ED  iohexol  (OMNIPAQUE ) 350 MG/ML injection 60 mL (60 mLs Intravenous Contrast Given 07/18/24 1947)                                    Medical Decision Making 38 year old female with past medical history of end-stage renal disease and asthma presenting to the emergency department today with concern for hypoxia after she was seeing her doctor for routine follow-up and was found to have some dyspnea on exertion.  The patient's chest x-ray does show some mild vascular congestion but no overt pulmonary edema.  I do not appreciate any rales here.  Her doctor sent her here to rule out pulmonary embolism.  Looking back through her chart it appears that her D-dimers have been elevated in the past.  Will obtain CT angiogram here.  If there are no findings consistent with pulmonary embolism will discuss her case with nephrology for dialysis as I suspect she is slightly volume overloaded.  The patient's workup.  There is revealing for the vascular congestion.  CT angiogram does not show any pulmonary embolism.  I did discuss her case with nephrology.  They will not be able to dialyze the patient until tomorrow.  Calls placed the hospitalist service for admission.  Amount and/or Complexity of Data Reviewed Radiology: ordered.  Risk Prescription drug management. Decision regarding hospitalization.        Final diagnoses:  Hypoxia    ED Discharge Orders     None          Ula Prentice SAUNDERS, MD 07/18/24 2032

## 2024-07-18 NOTE — Patient Instructions (Signed)
 Your pulse ox drops very low with ambulation and you have mild shortness of breath.  I recommend that you be seen in the emergency room to be evaluated to see if you are fluid overloaded or any underlaying blood clot in the lungs.

## 2024-07-18 NOTE — Progress Notes (Signed)
 Patient ID: Angela [name], female    DOB: 02-23-1986  MRN: [id number]  CC: Diabetes (DM f/u. /Pt prescribed atorvastatin  at [facility] but never received med from pharm )   Subjective: Angela [name] is a 38 y.o. female who presents for chronic ds management. 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  My medical assistant noted that she was hypoxic with pulse ox in the low 80s when she came in.  Patient reports some shortness of breath with walking.  She denies any cough or fever.  No recent long distance traveling.  No chest pains.  She reports that during dialysis they sometimes have to give her supplemental oxygen.  She has dialysis on Monday Wednesday and Fridays.  On dialysis yesterday she states that she was a few kilograms above her dry weight. Mother reports that sometimes at home when she comes back from HD, oxygen level may be low and goes up when she takes deep breath  She is working with [facility] transplant team to try to get on the transplant list for kidney transplant.  In regards to her diabetes, most recent A1c was 6.72 days ago.  She saw the endocrinologist at [facility] at that time.  She is seeing the nutritionist there.  She now has insulin  pump with NovoLog  insulin .  In regards to her hypertension, she reports compliance with taking her medications including carvedilol  6.25 mg twice a day, Cardizem  4 times a day, hydralazine  100 mg 3 times a day, amlodipine  10 mg daily. Hypothyroidism: She reports compliance with levothyroxine  200 mcg daily.   Patient Active Problem List   Diagnosis Date Noted   Liver cirrhosis  secondary to NASH (HCC) 04/04/2024   History of cerebral hemorrhage 10/11/2023   Acute hyperkalemia 10/10/2023   Steatosis (HCC) 08/22/2023   Malnutrition of moderate degree 08/10/2023   Acute respiratory failure with hypoxia (HCC) 08/07/2023   Numbness and tingling in right hand 08/01/2023   Chronic neck pain with abnormal neurologic examination 08/01/2023   Cervical radiculopathy at C8 08/01/2023   Proliferative diabetic retinopathy of both eyes with macular edema associated with type 1 diabetes mellitus (HCC) 05/11/2023   Protein calorie malnutrition (HCC) 02/15/2023   Seizure (HCC) 10/15/2021   ESRD on dialysis (HCC) 10/15/2021   Mallory-Weiss tear 11/16/2020   Chronic migraine without aura without status migrainosus, not intractable 04/09/2020   Occipital neuralgia of right side 04/09/2020   Anxiety about health 10/29/2019   Cannabis abuse, episodic 10/29/2019   Abnormal uterine bleeding (AUB) 08/13/2019   Ovarian mass, left 11/27/2018   Moderate major depression (HCC) 11/27/2018   Secondary hyperparathyroidism (HCC) 05/01/2018   Anxiety and depression 06/23/2017   Insomnia 01/12/2016   Non compliance with medical treatment 01/05/2016   Mixed hyperlipidemia 01/05/2016   Neurogenic bladder 02/20/2015   Diabetic peripheral neuropathy associated with type 1 diabetes mellitus (HCC) 02/20/2015   Iron deficiency 02/13/2015   Asthma 09/30/2014   Diabetic retinopathy (HCC) 09/30/2014   Atypical squamous cells of undetermined significance (ASCUS) on Papanicolaou smear of cervix 08/11/2014   Diabetic gastroparesis associated with type 1 diabetes mellitus (HCC) 08/05/2014   Anemia in chronic renal disease 08/05/2014  HTN (hypertension) 07/11/2012   GERD (gastroesophageal reflux disease) 09/26/2011   Hypothyroidism 09/14/2006   Uncontrolled type 1 diabetes mellitus with hypoglycemia, with long-term current use of insulin  (HCC) 01/15/2000     Current Facility-Administered Medications  on File Prior to Visit  Medication Dose Route Frequency Provider Last Rate Last Admin   adenosine  (diagnostic) (ADENOSCAN ) infusion 32.4 mg  0.56 mg/kg Intravenous Once Jeffrie Oneil BROCKS, MD       Current Outpatient Medications on File Prior to Visit  Medication Sig Dispense Refill   albuterol  (PROVENTIL  HFA) 108 (90 Base) MCG/ACT inhaler Inhale 2 puffs into the lungs every 6 (six) hours as needed for wheezing or shortness of breath. 8 g 3   amLODipine  (NORVASC ) 10 MG tablet Take 1 tablet (10 mg total) by mouth at bedtime. 90 tablet 2   atorvastatin  (LIPITOR) 40 MG tablet Take 1 tablet by mouth at bedtime.     B Complex-C-Folic Acid  (DIALYVITE TABLET) TABS Take 1 tablet by mouth daily.     busPIRone  (BUSPAR ) 15 MG tablet Take 1 tablet (15 mg total) by mouth at bedtime. 90 tablet 2   carvedilol  (COREG ) 6.25 MG tablet Take 1 tablet (6.25 mg total) by mouth 2 (two) times daily with a meal. 180 tablet 6   cinacalcet  (SENSIPAR ) 30 MG tablet Take 30 mg by mouth every evening.     Darbepoetin Alfa  (ARANESP , ALBUMIN  FREE,) 100 MCG/0.5ML SOSY injection Inject 0.5 mLs (100 mcg total) into the skin every Friday at 6 PM.Strength: 100 MCG/0.5ML 4 mL 2   diltiazem  (CARDIZEM ) 60 MG tablet TAKE 1 TABLET(60 MG) BY MOUTH EVERY 6 HOURS 120 tablet 2   escitalopram  (LEXAPRO ) 20 MG tablet Take 1 tablet (20 mg total) by mouth daily. 90 tablet 1   gabapentin  (NEURONTIN ) 100 MG capsule TAKE 1 CAPSULE(100 MG) BY MOUTH THREE TIMES DAILY 270 capsule 1   hydrALAZINE  (APRESOLINE ) 100 MG tablet TAKE 1 TABLET(100 MG) BY MOUTH THREE TIMES DAILY 270 tablet 1   insulin  aspart (NOVOLOG  FLEXPEN) 100 UNIT/ML FlexPen Inject 2-3 Units into the skin 3 (three) times daily with meals. Per sliding scale 15 mL 6   Insulin  Pen Needle (PEN NEEDLES) 31G X 8 MM MISC UAD 100 each 6   levETIRAcetam  (KEPPRA ) 500 MG tablet Take 1 tablet (500 mg total) by mouth 2 (two) times daily. With 250mg (1/2 tab) supplement after dialysis. 198 tablet 4    levothyroxine  (SYNTHROID ) 200 MCG tablet TAKE 1 TABLET(200 MCG) BY MOUTH DAILY BEFORE BREAKFAST. STOP THE 175 MCG DOSE 90 tablet 1   LOKELMA  5 g packet Take 1 packet by mouth daily.     metoCLOPramide  (REGLAN ) 5 MG tablet Take 1 tablet (5 mg total) by mouth 2 (two) times daily with a meal. 180 tablet 1   ondansetron  (ZOFRAN -ODT) 8 MG disintegrating tablet Take 1 tablet (8 mg total) by mouth every 8 (eight) hours as needed for nausea or vomiting. 20 tablet 6   pantoprazole  (PROTONIX ) 40 MG tablet TAKE 1 TABLET(40 MG) BY MOUTH DAILY 90 tablet 1   RENVELA  800 MG tablet Take 1,600 mg by mouth 3 (three) times daily.     TRESIBA  FLEXTOUCH 100 UNIT/ML FlexTouch Pen Inject 5 Units into the skin 2 (two) times daily. 15 mL 6    Allergies  Allergen Reactions   Iron Other (See Comments)    intolerance   Zestril  [Lisinopril ] Other (See Comments)    Hyperkalemia    Bactrim [Sulfamethoxazole-Trimethoprim] Hives and Itching   Feraheme [Ferumoxytol] Itching  Social History   Socioeconomic History   Marital status: Divorced    Spouse name: Not on file   Number of children: 0   Years of education: 2 years of college    Highest education level: Not on file  Occupational History   Occupation: unemployed    Comment: worked at a group   Occupation: disability  Tobacco Use   Smoking status: Former    Current packs/day: 0.00    Average packs/day: 0.3 packs/day for 2.0 years (0.5 ttl pk-yrs)    Types: Cigarettes    Start date: 03/05/2011    Quit date: 03/04/2013    Years since quitting: 11.3   Smokeless tobacco: Never  Vaping Use   Vaping status: Never Used  Substance and Sexual Activity   Alcohol use: No    Alcohol/week: 0.0 standard drinks of alcohol   Drug use: Not Currently    Frequency: 4.0 times per week    Types: Marijuana   Sexual activity: Yes    Partners: Female    Birth control/protection: None    Comment: women preference   Other Topics Concern   Not on file  Social History  Narrative   Occupation: currently unemployed   Single   Homosexual,     Used to be a gang member, got arrested for robbing a gas station (March - June 2012), is cleared now and lives away from her previous friends.          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        Update 04/09/2020   Has own apartment   Caffeine : maybe 2 cans of soda/day    Right handed      Update 03/21/2023   Lives with mother   Caffeine : once in awhile    Social Drivers of Health   Financial Resource Strain: Medium Risk (01/20/2022)   Overall Financial Resource Strain (CARDIA)    Difficulty of Paying Living Expenses: Somewhat hard  Food Insecurity: No Food Insecurity (03/05/2024)   Hunger Vital Sign    Worried About Running Out of Food in the Last Year: Never true    Ran Out of Food in the Last Year: Never true  Transportation Needs: No Transportation Needs (03/05/2024)   PRAPARE - Administrator, Civil Service (Medical): No    Lack of Transportation (Non-Medical): No  Physical Activity: Inactive (03/05/2024)   Exercise Vital Sign    Days of Exercise per Week: 0 days    Minutes of Exercise per Session: 0 min  Stress: Stress Concern Present (03/05/2024)   [employer] of Occupational Health - Occupational Stress Questionnaire    Feeling of Stress : Rather much  Social Connections: Socially Isolated (03/05/2024)   Social Connection and Isolation Panel    Frequency of Communication with Friends and Family: More than three times a week    Frequency of Social Gatherings with Friends and Family: Once a week    Attends Religious Services: Never    Database administrator or Organizations: No    Attends Banker Meetings: Never    Marital Status: Divorced  Catering manager Violence: Not At Risk (03/05/2024)   Humiliation, Afraid,  Rape, and Kick questionnaire    Fear of Current or Ex-Partner: No    Emotionally Abused: No    Physically  Abused: No    Sexually Abused: No    Family History  Problem Relation Age of Onset   Multiple sclerosis Mother    Hypothyroidism Mother    Stroke Mother        at age 86 yo   Migraines Mother    Hyperlipidemia Maternal Grandmother    Hypertension Maternal Grandmother    Heart disease Maternal Grandmother        unknown type   Diabetes Maternal Grandmother    Hypertension Maternal Grandfather    Prostate cancer Maternal Grandfather    Diabetes type I Maternal Grandfather    Breast cancer Paternal Grandmother    Cancer Neg Hx    Colon cancer Neg Hx    Esophageal cancer Neg Hx    Rectal cancer Neg Hx    Stomach cancer Neg Hx     Past Surgical History:  Procedure Laterality Date   ESOPHAGOGASTRODUODENOSCOPY (EGD) WITH PROPOFOL  N/A 04/04/2024   Procedure: ESOPHAGOGASTRODUODENOSCOPY (EGD) WITH PROPOFOL ;  Surgeon: Charlanne Groom, MD;  Location: [facility];  Service: Gastroenterology;  Laterality: N/A;   FOOT FUSION Right 2006   put screws in it too (09/19/2013)    ROS: Review of Systems Negative except as stated above  PHYSICAL EXAM: BP 130/80   Pulse 75   Temp 98.3 F (36.8 C) (Oral)   Ht 5' 4 (1.626 m)   Wt 132 lb (59.9 kg)   SpO2 (!) 83%   BMI 22.66 kg/m   Wt Readings from Last 3 Encounters:  07/18/24 134 lb (60.8 kg)  07/18/24 132 lb (59.9 kg)  04/04/24 138 lb (62.6 kg)  Pulse ox on room air at rest is between 91 to 95%. Pulse ox on room air at rest after taking deep breaths goes up to 99% Pulse ox room air on ambulation 83 to 85% with mild dyspnea Pox 2 L O2 at rest 99%, with ambulation 97-99% Physical Exam  General appearance -Young African-American female in NAD.  She has edema around both eyes and facial edema Mental status - normal mood, behavior, speech, dress, motor activity, and thought processes Chest - clear to auscultation, no wheezes,  rales or rhonchi, symmetric air entry Heart - normal rate, regular rhythm, normal S1, S2, no murmurs, rubs, clicks or gallops Extremities -no lower extremity edema      Latest Ref Rng & Units 04/04/2024    7:16 AM 11/10/2023    9:20 PM 10/17/2023    9:35 AM  CMP  Glucose 70 - 99 mg/dL 795  686  890   BUN 6 - 20 mg/dL 26  18  28    Creatinine 0.44 - 1.00 mg/dL 4.99  5.77  4.29   Sodium 135 - 145 mmol/L 132  133  137   Potassium 3.5 - 5.1 mmol/L 4.2  4.0  4.4   Chloride 98 - 111 mmol/L 93  88  91   CO2 22 - 32 mmol/L  35  33   Calcium  8.9 - 10.3 mg/dL  9.9  89.7   Total Protein 6.0 - 8.3 g/dL   7.7   Total Bilirubin 0.2 - 1.2 mg/dL   0.5   Alkaline Phos 39 - 117 U/L   162   AST 0 - 37 U/L   17   ALT 0 - 35 U/L   13    Lipid Panel     Component Value Date/Time   CHOL 247 (H) 01/28/2023 0300   CHOL 239 (H) 12/28/2020 1432   TRIG  88 01/31/2023 0548   HDL 119 01/28/2023 0300   HDL 72 12/28/2020 1432   CHOLHDL 2.1 01/28/2023 0300   VLDL 13 01/28/2023 0300   LDLCALC 115 (H) 01/28/2023 0300   LDLCALC 151 (H) 12/28/2020 1432    CBC    Component Value Date/Time   WBC 7.2 07/18/2024 1646   RBC 4.18 07/18/2024 1646   HGB 11.5 (L) 07/18/2024 1646   HGB 9.4 (L) 03/30/2023 1439   HCT 38.0 07/18/2024 1646   HCT 30.0 (L) 03/30/2023 1439   PLT 166 07/18/2024 1646   PLT 174 03/30/2023 1439   MCV 90.9 07/18/2024 1646   MCV 89 03/30/2023 1439   MCH 27.5 07/18/2024 1646   MCHC 30.3 07/18/2024 1646   RDW 19.9 (H) 07/18/2024 1646   RDW 14.7 03/30/2023 1439   LYMPHSABS 1.4 07/18/2024 1646   LYMPHSABS 1.3 03/21/2023 1214   MONOABS 0.7 07/18/2024 1646   EOSABS 0.4 07/18/2024 1646   EOSABS 0.8 (H) 03/21/2023 1214   BASOSABS 0.1 07/18/2024 1646   BASOSABS 0.1 03/21/2023 1214    ASSESSMENT AND PLAN: 1. Hypoxia (Primary) 2. DOE (dyspnea on exertion) May be due to the fact that she did not get to dry weight on dialysis yesterday even though she does not seem fluid overloaded today.   However she does have some facial edema.  Would also need to rule out PE. I suspect this may be chronic given that she states O2 sometimes low in HD and is given supplement O2 then.  I recommend that she be seen in the emergency room.  I will order home O2 for her.  3. Hypertensive kidney disease with ESRD on dialysis (HCC) Repeat blood pressure today is good.  Continue medications listed above.  Followed by the transplant team at [facility].  She is trying to get on the transplant list  4. 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, with long-term current use of insulin  (HCC) Most recent A1c not at goal.  She is followed by the endocrinologist at [facility].     Patient was given the opportunity to ask questions.  Patient verbalized understanding of the plan and was able to repeat key elements of the plan.   This documentation was completed using Paediatric nurse.  Any transcriptional errors are unintentional.  Orders Placed This Encounter  Procedures   For home use only DME oxygen     Requested Prescriptions    No prescriptions requested or ordered in this encounter    Return in about 6 weeks (around 08/29/2024).  Barnie Louder, MD, FACP
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
## 2024-07-18 NOTE — ED Triage Notes (Signed)
 POV/ ambulatory/ sent by PCP for poss PE/ pt was in office and cannot keep O2 sats up while ambulating/ pt is a MWF dialysis pt, pt reports not being at her dry weight after dialysis yesterday and concerned of excess fluid/ PT is A&OX4

## 2024-07-18 NOTE — ED Notes (Signed)
 Patient transported to CT

## 2024-07-18 NOTE — Hospital Course (Signed)
 Angela [name] is a 38 y.o. female with medical history significant for ESRD on MWF HD, T1DM, ICH, seizure disorder, HTN, hypothyroidism, asthma, anemia of chronic disease, depression/anxiety who is admitted with hypoxia.

## 2024-07-18 NOTE — ED Provider Triage Note (Signed)
 Emergency Medicine Provider Triage Evaluation Note  Angela [name] , a 38 y.o. female  was evaluated in triage.  Pt complains of decreased O2 sats on ambulation x 1-2 weeks, sent by PCP today for concerns for possible PE. On room air at baseline.  On HD MWF, did finish dialysis yesterday. Shortness of breath on ambulation. Hx of stroke last year with R sided known deficits. Not on blood thinner.  1 episode of vomiting yesterday.   Denies fever, cough, congestion, headache, chest pain,  Review of Systems  Positive: N/a Negative: N/a  Physical Exam  BP (!) 141/82   Pulse 75   Temp 98 F (36.7 C)   Resp 16   Ht 5' 4.5 (1.638 m)   Wt 60.8 kg   SpO2 (!) 83%   BMI 22.65 kg/m  Gen:   Awake, no distress   Resp:  Normal effort  MSK:   Moves extremities without difficulty  Other:    Medical Decision Making  Medically screening exam initiated at 4:30 PM.  Appropriate orders placed.  Angela [name] was informed that the remainder of the evaluation will be completed by another provider, this initial triage assessment does not replace that evaluation, and the importance of remaining in the ED until their evaluation is complete.     Angela [name], [place] 07/18/24 [phone]

## 2024-07-19 ENCOUNTER — Encounter: Payer: Self-pay | Admitting: Internal Medicine

## 2024-07-19 DIAGNOSIS — I120 Hypertensive chronic kidney disease with stage 5 chronic kidney disease or end stage renal disease: Secondary | ICD-10-CM | POA: Diagnosis present

## 2024-07-19 DIAGNOSIS — J45909 Unspecified asthma, uncomplicated: Secondary | ICD-10-CM | POA: Diagnosis present

## 2024-07-19 DIAGNOSIS — F32A Depression, unspecified: Secondary | ICD-10-CM | POA: Diagnosis present

## 2024-07-19 DIAGNOSIS — E875 Hyperkalemia: Secondary | ICD-10-CM | POA: Diagnosis present

## 2024-07-19 DIAGNOSIS — E10319 Type 1 diabetes mellitus with unspecified diabetic retinopathy without macular edema: Secondary | ICD-10-CM | POA: Diagnosis present

## 2024-07-19 DIAGNOSIS — I152 Hypertension secondary to endocrine disorders: Secondary | ICD-10-CM | POA: Diagnosis not present

## 2024-07-19 DIAGNOSIS — G40909 Epilepsy, unspecified, not intractable, without status epilepticus: Secondary | ICD-10-CM | POA: Diagnosis present

## 2024-07-19 DIAGNOSIS — N2581 Secondary hyperparathyroidism of renal origin: Secondary | ICD-10-CM | POA: Diagnosis present

## 2024-07-19 DIAGNOSIS — J9601 Acute respiratory failure with hypoxia: Secondary | ICD-10-CM | POA: Diagnosis present

## 2024-07-19 DIAGNOSIS — E8770 Fluid overload, unspecified: Secondary | ICD-10-CM | POA: Diagnosis present

## 2024-07-19 DIAGNOSIS — R0902 Hypoxemia: Secondary | ICD-10-CM | POA: Diagnosis present

## 2024-07-19 DIAGNOSIS — Z992 Dependence on renal dialysis: Secondary | ICD-10-CM | POA: Diagnosis not present

## 2024-07-19 DIAGNOSIS — E1022 Type 1 diabetes mellitus with diabetic chronic kidney disease: Secondary | ICD-10-CM | POA: Diagnosis present

## 2024-07-19 DIAGNOSIS — E1059 Type 1 diabetes mellitus with other circulatory complications: Secondary | ICD-10-CM | POA: Diagnosis present

## 2024-07-19 DIAGNOSIS — E1040 Type 1 diabetes mellitus with diabetic neuropathy, unspecified: Secondary | ICD-10-CM | POA: Diagnosis present

## 2024-07-19 DIAGNOSIS — E1159 Type 2 diabetes mellitus with other circulatory complications: Secondary | ICD-10-CM | POA: Diagnosis not present

## 2024-07-19 DIAGNOSIS — E782 Mixed hyperlipidemia: Secondary | ICD-10-CM | POA: Diagnosis present

## 2024-07-19 DIAGNOSIS — H35033 Hypertensive retinopathy, bilateral: Secondary | ICD-10-CM | POA: Diagnosis present

## 2024-07-19 DIAGNOSIS — E1065 Type 1 diabetes mellitus with hyperglycemia: Secondary | ICD-10-CM | POA: Diagnosis present

## 2024-07-19 DIAGNOSIS — I69151 Hemiplegia and hemiparesis following nontraumatic intracerebral hemorrhage affecting right dominant side: Secondary | ICD-10-CM | POA: Diagnosis not present

## 2024-07-19 DIAGNOSIS — Z794 Long term (current) use of insulin: Secondary | ICD-10-CM | POA: Diagnosis not present

## 2024-07-19 DIAGNOSIS — E103593 Type 1 diabetes mellitus with proliferative diabetic retinopathy without macular edema, bilateral: Secondary | ICD-10-CM | POA: Diagnosis present

## 2024-07-19 DIAGNOSIS — N186 End stage renal disease: Secondary | ICD-10-CM | POA: Diagnosis present

## 2024-07-19 DIAGNOSIS — I871 Compression of vein: Secondary | ICD-10-CM | POA: Diagnosis present

## 2024-07-19 DIAGNOSIS — D631 Anemia in chronic kidney disease: Secondary | ICD-10-CM | POA: Diagnosis present

## 2024-07-19 DIAGNOSIS — E039 Hypothyroidism, unspecified: Secondary | ICD-10-CM | POA: Diagnosis present

## 2024-07-19 DIAGNOSIS — Z79890 Hormone replacement therapy (postmenopausal): Secondary | ICD-10-CM | POA: Diagnosis not present

## 2024-07-19 LAB — BASIC METABOLIC PANEL WITH GFR
Anion gap: 16 — ABNORMAL HIGH (ref 5–15)
BUN: 35 mg/dL — ABNORMAL HIGH (ref 6–20)
CO2: 27 mmol/L (ref 22–32)
Calcium: 9.7 mg/dL (ref 8.9–10.3)
Chloride: 91 mmol/L — ABNORMAL LOW (ref 98–111)
Creatinine, Ser: 7.69 mg/dL — ABNORMAL HIGH (ref 0.44–1.00)
GFR, Estimated: 6 mL/min — ABNORMAL LOW (ref >60)
Glucose, Bld: 152 mg/dL — ABNORMAL HIGH (ref 70–99)
Potassium: 5.1 mmol/L (ref 3.5–5.1)
Sodium: 134 mmol/L — ABNORMAL LOW (ref 135–145)

## 2024-07-19 LAB — CBC
HCT: 36.7 % (ref 36.0–46.0)
Hemoglobin: 10.8 g/dL — ABNORMAL LOW (ref 12.0–15.0)
MCH: 26.9 pg (ref 26.0–34.0)
MCHC: 29.4 g/dL — ABNORMAL LOW (ref 30.0–36.0)
MCV: 91.5 fL (ref 80.0–100.0)
Platelets: 181 K/uL (ref 150–400)
RBC: 4.01 MIL/uL (ref 3.87–5.11)
RDW: 19.9 % — ABNORMAL HIGH (ref 11.5–15.5)
WBC: 8.3 K/uL (ref 4.0–10.5)
nRBC: 0 % (ref 0.0–0.2)

## 2024-07-19 LAB — CBG MONITORING, ED
Glucose-Capillary: 154 mg/dL — ABNORMAL HIGH (ref 70–99)
Glucose-Capillary: 111 mg/dL — ABNORMAL HIGH (ref 70–99)

## 2024-07-19 LAB — GLUCOSE, CAPILLARY
Glucose-Capillary: 144 mg/dL — ABNORMAL HIGH (ref 70–99)
Glucose-Capillary: 142 mg/dL — ABNORMAL HIGH (ref 70–99)

## 2024-07-19 NOTE — ED Notes (Signed)
 Pts oxygen was sating at 85% on 1L [place] RN increased oxygen to 3L Angela [name]

## 2024-07-19 NOTE — Care Management (Signed)
  Transition of Care [facility]) Screening Note   Patient Details  Name: Angela [name] Date of Birth: [birth year]   [facility] of [facility]) CM/SW Contact:    Corean JAYSON Canary, RN Phone Number: 07/19/2024, 12:40 PM    Transition of Care Department [facility]) has reviewed patient and no TOC needs have been identified at this time. We will continue to monitor patient advancement through interdisciplinary progression rounds. If new patient transition needs arise, please place a TOC consult.

## 2024-07-19 NOTE — Progress Notes (Signed)
   07/19/24 1203  Vitals  Temp 97.9 F (36.6 C)  Pulse Rate 78  Resp 15  BP (!) 161/84  SpO2 97 %  O2 Device Nasal Cannula  Oxygen Therapy  O2 Flow Rate (L/min) 3 L/min  Post Treatment  Dialyzer Clearance Lightly streaked  Hemodialysis Intake (mL) 0 mL  Liters Processed 69.7  Fluid Removed (mL) 3500 mL  Tolerated HD Treatment Yes  AVG/AVF Arterial Site Held (minutes) 10 minutes  AVG/AVF Venous Site Held (minutes) 10 minutes   Received patient in bed to unit.  Alert and oriented.  Informed consent signed and in chart.   TX duration:3HRS  Patient tolerated well.  Transported back to the room  Alert, without acute distress.  Hand-off given to patient's nurse.   Access used: LAVF Access issues: NONE  Total UF removed: 3.5L Medication(s) given: NONE   [facility]
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
## 2024-07-19 NOTE — Progress Notes (Signed)
 Pt receives OP HD at [facility] clinic, MWF, chair time [phone]. Will assist as needed.   Inioluwa Baris Dialysis Navigator [phone]

## 2024-07-19 NOTE — Procedures (Signed)
 I was present at this dialysis session. I have reviewed the session itself and made appropriate changes.   Filed Weights   07/18/24 1610  Weight: 60.8 kg    Recent Labs  Lab 07/19/24 0254  NA 134*  K 5.1  CL 91*  CO2 27  GLUCOSE 152*  BUN 35*  CREATININE 7.69*  CALCIUM  9.7    Recent Labs  Lab 07/18/24 1646 07/19/24 0254  WBC 7.2 8.3  NEUTROABS 4.6  --   HGB 11.5* 10.8*  HCT 38.0 36.7  MCV 90.9 91.5  PLT 166 181    Scheduled Meds:  amLODipine   10 mg Oral QHS   atorvastatin   40 mg Oral QHS   busPIRone   15 mg Oral QHS   carvedilol   6.25 mg Oral BID WC   Chlorhexidine  Gluconate Cloth  6 each Topical Q0600   escitalopram   20 mg Oral Daily   gabapentin   100 mg Oral TID   heparin   5,000 Units Subcutaneous Q8H   hydrALAZINE   100 mg Oral Q8H   insulin  pump   Subcutaneous TID WC, HS, 0200   levETIRAcetam   500 mg Oral BID   levothyroxine   200 mcg Oral Q0600   metoCLOPramide   5 mg Oral BID WC   pantoprazole   40 mg Oral Daily   sevelamer  carbonate  1,600 mg Oral TID WC   sodium chloride  flush  3 mL Intravenous Q12H   sodium zirconium cyclosilicate   5 g Oral Daily   Continuous Infusions: PRN Meds:.acetaminophen  **OR** acetaminophen , albuterol , ondansetron  **OR** ondansetron  (ZOFRAN ) IV, senna-docusate   Jayson Player,  MD 07/19/2024, 10:18 AM

## 2024-07-19 NOTE — Inpatient Diabetes Management (Addendum)
 Inpatient Diabetes Program Recommendations  AACE/ADA: New Consensus Statement on Inpatient Glycemic Control (2015)  Target Ranges:  Prepandial:   less than 140 mg/dL      Peak postprandial:   less than 180 mg/dL (1-2 hours)      Critically ill patients:  140 - 180 mg/dL   Lab Results  Component Value Date   GLUCAP 111 (H) 07/19/2024   HGBA1C 6.6 (H) 10/11/2023    Review of Glycemic Control  Diabetes history: DM1 Outpatient Diabetes medications: Omnipod 5 insulin  pump with Dexcom G7 Current orders for Inpatient glycemic control: insulin  pump  Inpatient Diabetes Program Recommendations:   Spoke with patient at the bedside. Patient was diagnosed with diabetes at 38 years old. Has had this insulin  pump for about 2 months. Patient did say that she had dialysis yesterday after admission.  Noted insulin  pump settings from notes  [facility] for Omnipod 5 insulin  pump on 05/08/24:  Basal Rate  Total Basal Dose: 10.8 units/day  Time units/hr  12:00 AM 0.45   Blood Glucose Target  Time mg/dL  87:99 AM 849 - 849   Sensitivity Factor  Time mg/dL/unit  87:99 AM 75   Carb Ratio  Time g/unit   Patient in ED with insulin  pump orders. If patient is admitted to hospital, she will need to provide her own supplies if her site needs to be changed. Discussed with patient. Will continue to follow while in the hospital.  Marjorie Lunger RN BSN CDE Diabetes Coordinator Pager: [phone]  8am-5pm

## 2024-07-19 NOTE — Progress Notes (Signed)
 PROGRESS NOTE    Angela [name]  FMW:[id number] DOB: 07/21/1986 DOA: 07/18/2024 PCP: Vicci Barnie NOVAK, MD  Outpatient Specialists:     Brief Narrative:  Patient is a 38 year old female with multiple medical and cardiac history.  Patient carries diagnosis of end-stage renal disease is likely secondary to diabetes mellitus and hypertension.  Other medical history includes intracerebral hemorrhage, hypothyroidism, diabetic retinopathy secondary to type 1 diabetes mellitus, seizure, asthma and anemia.  Patient is on hemodialysis on Monday, Wednesday and Friday.  Patient reports being compliant with hemodialysis.  Patient was admitted with hypoxia.  According to the patient, she went to her primary care provider's office and was noted to have significant hypoxia.  Patient was on 3 L of supplemental oxygen prior to hemodialysis done earlier today.  3.5 L ultrafiltration was reported.  Patient's supplemental oxygen was decreased to 1 L/min via nasal cannula, however, patient's nurse reports O2 sat of 85%.  Discussed with nephrology team, patient will likely undergo repeat hemodialysis/ultrafiltration tomorrow.  No other constitutional symptoms endorsed.   Assessment & Plan:   Principal Problem:   Acute respiratory failure with hypoxia (HCC) Active Problems:   Hypothyroidism   Hypertension associated with diabetes (HCC)   Uncontrolled type 1 diabetes mellitus with hyperglycemia, with long-term current use of insulin  (HCC)   Mixed hyperlipidemia   Anxiety and depression   Seizure disorder (HCC)   ESRD on hemodialysis (HCC)   Subclavian vein stenosis, right   Acute respiratory failure with hypoxia ESRD on MWF HD: -SpO2 83% on RA on arrival - Patient was on 3 L of supplemental oxygen via nasal cannula prior to hemodialysis. - Supplemental oxygen is down to 1 L/min via nasal cannula after dialysis, with O2 sat of 85% documented. - Titrate supplemental oxygen to keep O2 sat greater than 91%. -  Repeat hemodialysis tomorrow.   - Discussed with nephrology team.     Hyperkalemia: K 5.4.  Give Lokelma  10 g last time. Potassium of 5.1 prior to dialysis. Repeat hemodialysis tomorrow..   Type 1 diabetes: Continue insulin  pump.  Hemoglobin A1c 8.7% on 7/29.   Right subclavian vein stenosis: Seen on CTA chest with increased collaterals in the right chest wall and right neck. Will refer to nephrology and vascular surgery team.   Hypertension: Continue amlodipine , Coreg , hydralazine . Goal blood pressure should be less than 130/80 mmHg.   Seizure disorder: Continue Keppra .   Hypothyroidism: Continue Synthroid .   Hyperlipidemia: Continue atorvastatin .   History of cerebral hemorrhage: At baseline.   Depression/anxiety: Continue BuSpar , Lexapro .    DVT prophylaxis: Subcutaneous heparin  Code Status: Full code Family Communication:  Disposition Plan: Inpatient.   Consultants:  Nephrology   Procedures:  Hemodialysis was done earlier today.  Antimicrobials:  None.   Subjective: No new complaints.  Objective: Vitals:   07/19/24 1203 07/19/24 1215 07/19/24 1309 07/19/24 1520  BP: (!) 161/84 (!) 170/89  (!) 174/93  Pulse: 78 75  78  Resp: 15 10  16   Temp: 97.9 F (36.6 C)  97.9 F (36.6 C) 98 F (36.7 C)  TempSrc:   Oral Oral  SpO2: 97% 98%  100%  Weight:      Height:        Intake/Output Summary (Last 24 hours) at 07/19/2024 1535 Last data filed at 07/19/2024 1203 Gross per 24 hour  Intake --  Output 3500 ml  Net -3500 ml   Filed Weights   07/18/24 1610  Weight: 60.8 kg    Examination:  General exam: Appears  calm and comfortable.  Patient is pale.  Puffy face.  Patient is thin. Respiratory system: Clear to auscultation.  Cardiovascular system: S1 & S2 heard. Gastrointestinal system: Abdomen is soft and nontender.   Central nervous system: Alert and oriented.  Patient moves all extremities. Extremities: No leg edema.    Data Reviewed: I have  personally reviewed following labs and imaging studies  CBC: Recent Labs  Lab 07/18/24 1646 07/19/24 0254  WBC 7.2 8.3  NEUTROABS 4.6  --   HGB 11.5* 10.8*  HCT 38.0 36.7  MCV 90.9 91.5  PLT 166 181   Basic Metabolic Panel: Recent Labs  Lab 07/18/24 1646 07/19/24 0254  NA 134* 134*  K 5.4* 5.1  CL 92* 91*  CO2 27 27  GLUCOSE 230* 152*  BUN 32* 35*  CREATININE 7.32* 7.69*  CALCIUM  10.4* 9.7   GFR: Estimated Creatinine Clearance: 8.7 mL/min (A) (by C-G formula based on SCr of 7.69 mg/dL (H)). Liver Function Tests: Recent Labs  Lab 07/18/24 1646  AST 21  ALT 17  ALKPHOS 112  BILITOT 0.8  PROT 7.2  ALBUMIN  4.1   No results for input(s): LIPASE, AMYLASE in the last 168 hours. No results for input(s): AMMONIA in the last 168 hours. Coagulation Profile: No results for input(s): INR, PROTIME in the last 168 hours. Cardiac Enzymes: No results for input(s): CKTOTAL, CKMB, CKMBINDEX, TROPONINI in the last 168 hours. BNP (last 3 results) No results for input(s): PROBNP in the last 8760 hours. HbA1C: No results for input(s): HGBA1C in the last 72 hours. CBG: Recent Labs  Lab 07/19/24 0239 07/19/24 0747  GLUCAP 154* 111*   Lipid Profile: No results for input(s): CHOL, HDL, LDLCALC, TRIG, CHOLHDL, LDLDIRECT in the last 72 hours. Thyroid  Function Tests: No results for input(s): TSH, T4TOTAL, FREET4, T3FREE, THYROIDAB in the last 72 hours. Anemia Panel: No results for input(s): VITAMINB12, FOLATE, FERRITIN, TIBC, IRON, RETICCTPCT in the last 72 hours. Urine analysis:    Component Value Date/Time   COLORURINE YELLOW 02/11/2023 1148   APPEARANCEUR HAZY (A) 02/11/2023 1148   APPEARANCEUR Clear 04/30/2019 1141   LABSPEC 1.011 02/11/2023 1148   PHURINE 8.0 02/11/2023 1148   GLUCOSEU 50 (A) 02/11/2023 1148   GLUCOSEU 100 (A) 10/14/2009 2309   HGBUR NEGATIVE 02/11/2023 1148   BILIRUBINUR NEGATIVE 02/11/2023  1148   BILIRUBINUR Negative 04/30/2019 1141   BILIRUBINUR negative 12/30/2016 1617   KETONESUR NEGATIVE 02/11/2023 1148   PROTEINUR >=300 (A) 02/11/2023 1148   UROBILINOGEN 0.2 07/19/2018 1351   UROBILINOGEN 0.2 10/24/2015 0029   NITRITE NEGATIVE 02/11/2023 1148   LEUKOCYTESUR TRACE (A) 02/11/2023 1148   Sepsis Labs: @LABRCNTIP (procalcitonin:4,lacticidven:4)  )No results found for this or any previous visit (from the past 240 hours).       Radiology Studies: CT Angio Chest PE W and/or Wo Contrast Result Date: 07/18/2024 CLINICAL DATA:  Shortness of breath EXAM: CT ANGIOGRAPHY CHEST WITH CONTRAST TECHNIQUE: Multidetector CT imaging of the chest was performed using the standard protocol during bolus administration of intravenous contrast. Multiplanar CT image reconstructions and MIPs were obtained to evaluate the vascular anatomy. RADIATION DOSE REDUCTION: This exam was performed according to the departmental dose-optimization program which includes automated exposure control, adjustment of the mA and/or kV according to patient size and/or use of iterative reconstruction technique. CONTRAST:  60mL OMNIPAQUE  IOHEXOL  350 MG/ML SOLN COMPARISON:  Chest x-ray from earlier in the same day. FINDINGS: Cardiovascular: Thoracic aorta and its branches are within normal limits. The heart is mildly  enlarged in size. The pulmonary artery shows a normal branching pattern bilaterally. No intraluminal filling defect to suggest pulmonary embolism is seen. Multiple venous collaterals are noted in the right chest and neck consistent with a degree of right subclavian vein stenosis. Mediastinum/Nodes: Thoracic inlet is within normal limits. No hilar or mediastinal adenopathy is noted. The esophagus as visualized is within normal limits. Lungs/Pleura: Lungs are well aerated bilaterally. No focal infiltrate or sizable effusion is seen. No parenchymal nodule is noted. Mild scarring is noted in the left base. Upper  Abdomen: Visualized upper abdomen is unremarkable. Musculoskeletal: No acute bony abnormality is noted. Review of the MIP images confirms the above findings. IMPRESSION: No evidence of pulmonary emboli. 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 No other focal abnormality is noted. Electronically Signed   By: Oneil Devonshire M.D.   On: 07/18/2024 19:59   DG Chest 2 View Result Date: 07/18/2024 CLINICAL DATA:  Shortness of breath EXAM: CHEST - 2 VIEW COMPARISON:  Chest radiograph October 09, 2023 FINDINGS: Enlarged cardiomediastinal silhouette, similar to prior. No suspicious pulmonary nodule or consolidation. Previously seen patchy pulmonary opacities and pleural effusion is resolved. Right posterior upper ribs healed fractures. IMPRESSION: Cardiomegaly. Mild vascular congestion without significant opacity/airspace consolidation. Healed rib fractures. Electronically Signed   By: Megan  Zare M.D.   On: 07/18/2024 17:19        Scheduled Meds:  amLODipine   10 mg Oral QHS   atorvastatin   40 mg Oral QHS   busPIRone   15 mg Oral QHS   carvedilol   6.25 mg Oral BID WC   Chlorhexidine  Gluconate Cloth  6 each Topical Q0600   escitalopram   20 mg Oral Daily   gabapentin   100 mg Oral TID   heparin   5,000 Units Subcutaneous Q8H   hydrALAZINE   100 mg Oral Q8H   insulin  pump   Subcutaneous TID WC, HS, 0200   levETIRAcetam   500 mg Oral BID   levothyroxine   200 mcg Oral Q0600   metoCLOPramide   5 mg Oral BID WC   pantoprazole   40 mg Oral Daily   sevelamer  carbonate  1,600 mg Oral TID WC   sodium chloride  flush  3 mL Intravenous Q12H   sodium zirconium cyclosilicate   5 g Oral Daily   Continuous Infusions:   LOS: 0 days    Time spent: 35 minutes.    Leatrice Chapel, MD  [facility] Pager #: [phone] 7PM-7AM contact night coverage as above

## 2024-07-20 DIAGNOSIS — E1159 Type 2 diabetes mellitus with other circulatory complications: Secondary | ICD-10-CM | POA: Diagnosis not present

## 2024-07-20 DIAGNOSIS — G40909 Epilepsy, unspecified, not intractable, without status epilepticus: Secondary | ICD-10-CM | POA: Diagnosis not present

## 2024-07-20 DIAGNOSIS — I152 Hypertension secondary to endocrine disorders: Secondary | ICD-10-CM

## 2024-07-20 DIAGNOSIS — Z992 Dependence on renal dialysis: Secondary | ICD-10-CM

## 2024-07-20 DIAGNOSIS — N186 End stage renal disease: Secondary | ICD-10-CM

## 2024-07-20 DIAGNOSIS — J9601 Acute respiratory failure with hypoxia: Secondary | ICD-10-CM | POA: Diagnosis not present

## 2024-07-20 LAB — RENAL FUNCTION PANEL
Albumin: 3.7 g/dL (ref 3.5–5.0)
Anion gap: 16 — ABNORMAL HIGH (ref 5–15)
BUN: 24 mg/dL — ABNORMAL HIGH (ref 6–20)
CO2: 27 mmol/L (ref 22–32)
Calcium: 10.7 mg/dL — ABNORMAL HIGH (ref 8.9–10.3)
Chloride: 90 mmol/L — ABNORMAL LOW (ref 98–111)
Creatinine, Ser: 6.37 mg/dL — ABNORMAL HIGH (ref 0.44–1.00)
GFR, Estimated: 8 mL/min — ABNORMAL LOW (ref >60)
Glucose, Bld: 159 mg/dL — ABNORMAL HIGH (ref 70–99)
Phosphorus: 7.2 mg/dL — ABNORMAL HIGH (ref 2.5–4.6)
Potassium: 5 mmol/L (ref 3.5–5.1)
Sodium: 133 mmol/L — ABNORMAL LOW (ref 135–145)

## 2024-07-20 LAB — GLUCOSE, CAPILLARY
Glucose-Capillary: 138 mg/dL — ABNORMAL HIGH (ref 70–99)
Glucose-Capillary: 149 mg/dL — ABNORMAL HIGH (ref 70–99)
Glucose-Capillary: 140 mg/dL — ABNORMAL HIGH (ref 70–99)
Glucose-Capillary: 119 mg/dL — ABNORMAL HIGH (ref 70–99)
Glucose-Capillary: 164 mg/dL — ABNORMAL HIGH (ref 70–99)

## 2024-07-20 LAB — HEPATITIS B SURFACE ANTIBODY, QUANTITATIVE: Hep B S AB Quant (Post): 338 m[IU]/mL

## 2024-07-20 NOTE — Progress Notes (Signed)
   07/20/24 1203  Vitals  Temp 97.6 F (36.4 C)  Pulse Rate 75  Resp 10  BP (!) 161/87  SpO2 99 %  O2 Device Room Air  Weight 56.8 kg  Type of Weight Post-Dialysis  Post Treatment  Dialyzer Clearance Clear  Liters Processed 55.6  Fluid Removed (mL) 3000 mL  Tolerated HD Treatment Yes  AVG/AVF Arterial Site Held (minutes) 6 minutes  AVG/AVF Venous Site Held (minutes) 6 minutes      Received patient in bed to unit.  Alert and oriented.  Informed consent signed and in chart.    TX duration:   Patient tolerated well.  Transported back to the room  Alert, without acute distress.  Hand-off given to patient's nurse.    Access used: LAVF Access issues: none   Total UF removed: 3L Medication(s) given:  see eMAR    Woodfin Dolores RN [facility]

## 2024-07-20 NOTE — Progress Notes (Signed)
 [facility] Progress Note   Subjective:  Seen at onset of HD - extra HD today for volume/hypoxia - 3L UFG planned. No new complaints today.  Objective Vitals:   07/20/24 0500 07/20/24 0512 07/20/24 0730 07/20/24 0746  BP:  (!) 174/92  (!) 155/72  Pulse:    82  Resp:    13  Temp:    98.7 F (37.1 C)  TempSrc:    Oral  SpO2:   90% (!) 88%  Weight: 59.8 kg     Height:       Physical Exam General: Well appearing woman, NAD. Hypoxic on room air Heart: RRR  Lungs: CTAB Abdomen: soft Extremities: no LE edema Dialysis Access:  LUE AVF +t/b  Additional Objective Labs: Basic Metabolic Panel: Recent Labs  Lab 07/18/24 1646 07/19/24 0254  NA 134* 134*  K 5.4* 5.1  CL 92* 91*  CO2 27 27  GLUCOSE 230* 152*  BUN 32* 35*  CREATININE 7.32* 7.69*  CALCIUM  10.4* 9.7   Liver Function Tests: Recent Labs  Lab 07/18/24 1646  AST 21  ALT 17  ALKPHOS 112  BILITOT 0.8  PROT 7.2  ALBUMIN  4.1   CBC: Recent Labs  Lab 07/18/24 1646 07/19/24 0254  WBC 7.2 8.3  NEUTROABS 4.6  --   HGB 11.5* 10.8*  HCT 38.0 36.7  MCV 90.9 91.5  PLT 166 181   Studies/Results: CT Angio Chest PE W and/or Wo Contrast Result Date: 07/18/2024 CLINICAL DATA:  Shortness of breath EXAM: CT ANGIOGRAPHY CHEST WITH CONTRAST TECHNIQUE: Multidetector CT imaging of the chest was performed using the standard protocol during bolus administration of intravenous contrast. Multiplanar CT image reconstructions and MIPs were obtained to evaluate the vascular anatomy. RADIATION DOSE REDUCTION: This exam was performed according to the departmental dose-optimization program which includes automated exposure control, adjustment of the mA and/or kV according to patient size and/or use of iterative reconstruction technique. CONTRAST:  60mL OMNIPAQUE  IOHEXOL  350 MG/ML SOLN COMPARISON:  Chest x-ray from earlier in the same day. FINDINGS: Cardiovascular: Thoracic aorta and its branches are within normal limits.  The heart is mildly enlarged in size. The pulmonary artery shows a normal branching pattern bilaterally. No intraluminal filling defect to suggest pulmonary embolism is seen. Multiple venous collaterals are noted in the right chest and neck consistent with a degree of right subclavian vein stenosis. Mediastinum/Nodes: Thoracic inlet is within normal limits. No hilar or mediastinal adenopathy is noted. The esophagus as visualized is within normal limits. Lungs/Pleura: Lungs are well aerated bilaterally. No focal infiltrate or sizable effusion is seen. No parenchymal nodule is noted. Mild scarring is noted in the left base. Upper Abdomen: Visualized upper abdomen is unremarkable. Musculoskeletal: No acute bony abnormality is noted. Review of the MIP images confirms the above findings. IMPRESSION: No evidence of pulmonary emboli. 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 No other focal abnormality is noted. Electronically Signed   By: Oneil Devonshire M.D.   On: 07/18/2024 19:59   DG Chest 2 View Result Date: 07/18/2024 CLINICAL DATA:  Shortness of breath EXAM: CHEST - 2 VIEW COMPARISON:  Chest radiograph October 09, 2023 FINDINGS: Enlarged cardiomediastinal silhouette, similar to prior. No suspicious pulmonary nodule or consolidation. Previously seen patchy pulmonary opacities and pleural effusion is resolved. Right posterior upper ribs healed fractures. IMPRESSION: Cardiomegaly. Mild vascular congestion without significant opacity/airspace consolidation. Healed rib fractures. Electronically Signed   By: Megan  Zare M.D.   On:  07/18/2024 17:19   Medications:   amLODipine   10 mg Oral QHS   atorvastatin   40 mg Oral QHS   busPIRone   15 mg Oral QHS   carvedilol   6.25 mg Oral BID WC   Chlorhexidine  Gluconate Cloth  6 each Topical Q0600   escitalopram   20 mg Oral Daily   gabapentin   100 mg Oral TID   heparin   5,000 Units Subcutaneous Q8H   hydrALAZINE   100  mg Oral Q8H   insulin  pump   Subcutaneous TID WC, HS, 0200   levETIRAcetam   500 mg Oral BID   levothyroxine   200 mcg Oral Q0600   metoCLOPramide   5 mg Oral BID WC   pantoprazole   40 mg Oral Daily   sevelamer  carbonate  1,600 mg Oral TID WC   sodium chloride  flush  3 mL Intravenous Q12H   sodium zirconium cyclosilicate   5 g Oral Daily    Dialysis Orders MWF - [facility] 3:45hr, 400/A1.5, EDW 59kg, 2K/2C bath, AVF, no heparin  - Mircera 150mcg IV q 2 weeks - last given 7/28 - Venofer 50mg  IV weekly - last given 7.30 - Calcitriol  1.5mcg PO q HD   Assessment/Plan:  Hypoxia/Dyspnea with exertion: CTA without PE, no pulm edema. Echo 2024 with normal LVEF but severe LVH. Trying to max UF with HD to improve volume.  ESRD: Usual MWF schedule - for extra HD today for volume. 3L UFG.  Hypertension/volume: BP remains high. 3.5L off yesterday, for another 3L today.  Anemia: Hgb 10.8 - not due for ESA  Metabolic bone disease: Ca up on admit, hold VDRA for now. Continue sevelamer  as binder.  T1DM: Insulin  per primary.   Hypothyroidism   Izetta Boehringer, PA-C 07/20/2024, 8:10 AM  [employer]

## 2024-07-20 NOTE — Plan of Care (Signed)
  Problem: Health Behavior/Discharge Planning: Goal: Ability to manage health-related needs will improve Outcome: Progressing   Problem: Clinical Measurements: Goal: Will remain free from infection Outcome: Progressing Goal: Respiratory complications will improve Outcome: Progressing   Problem: Nutrition: Goal: Adequate nutrition will be maintained Outcome: Progressing   Problem: Coping: Goal: Level of anxiety will decrease Outcome: Progressing

## 2024-07-20 NOTE — Progress Notes (Signed)
 Pt to HD at approx 0800

## 2024-07-20 NOTE — Plan of Care (Signed)

## 2024-07-20 NOTE — Progress Notes (Signed)
 PROGRESS NOTE        PATIENT DETAILS Name: Angela [name] Age: 38 y.o. Sex: female Date of Birth: 1986-10-14 Admit Date: 07/18/2024 Admitting Physician Leatrice LILLETTE Chapel, MD ERE:Gnywdnw, Barnie NOVAK, MD  Brief Summary: Patient is a 38 y.o.  female with history of ESRD on HD MWF, DM-1 on insulin  pump, HTN-who presented with shortness of breath-was found to have hypoxia-presented with thought to have volume overload and subsequently admitted to the hospitalist service.  Significant events: 7/31>> admit to [facility]  Significant studies: 7/31>> CTA chest: No PE  Significant microbiology data: None  Procedures: None  Consults: None  Subjective: Lying comfortably in bed-denies any chest pain or shortness of breath.  Objective: Vitals: Blood pressure (!) 171/80, pulse 78, temperature 98 F (36.7 C), resp. rate 16, height 5' 4 (1.626 m), weight 59.7 kg, SpO2 100%.   Exam: Gen Exam:Alert awake-not in any distress HEENT:atraumatic, normocephalic Chest: B/L clear to auscultation anteriorly CVS:S1S2 regular Abdomen:soft non tender, non distended Extremities:no edema Neurology: Non focal Skin: no rash  Pertinent Labs/Radiology:    Latest Ref Rng & Units 07/19/2024    2:54 AM 07/18/2024    4:46 PM 04/04/2024    7:16 AM  CBC  WBC 4.0 - 10.5 K/uL 8.3  7.2    Hemoglobin 12.0 - 15.0 g/dL 89.1  88.4  85.6   Hematocrit 36.0 - 46.0 % 36.7  38.0  42.0   Platelets 150 - 400 K/uL 181  166      Lab Results  Component Value Date   NA 133 (L) 07/20/2024   K 5.0 07/20/2024   CL 90 (L) 07/20/2024   CO2 27 07/20/2024      Assessment/Plan: Acute hypoxic respiratory failure secondary to volume overload in the setting of ESRD Overall improved-will get second HD later today Attempt to titrate off oxygen/mobilize after HD today.  History of right subclavian vein stenosis with increased collaterals in the right chest wall/right neck Incidental finding on CT  chest Unclear significance-will defer further workup to outpatient setting/nephrology.  Hyperkalemia Resolved with HD/Lokelma  Continue maintenance Lokelma .  DM-1 (A1c 8.7 on 7/21) CBG stable Continue insulin  pump  HTN BP stable Amlodipine /Coreg /hydralazine   HLD Statin  Seizure disorder Keppra   Hypothyroidism Synthroid   Prior history of cerebral hemorrhage At baseline-moving all 4 extremities  Depression/anxiety Stable BuSpar /Lexapro .   Code status:   Code Status: Full Code   DVT Prophylaxis: heparin  injection 5,000 Units Start: 07/18/24 2200    Family Communication: None at bedside   Disposition Plan: Status is: Inpatient Remains inpatient appropriate because: Severity of illness   Planned Discharge Destination:Home   Diet: Diet Order             Diet renal/carb modified with fluid restriction Fluid consistency: Thin  Diet effective now                     Antimicrobial agents: Anti-infectives (From admission, onward)    None        MEDICATIONS: Scheduled Meds:  amLODipine   10 mg Oral QHS   atorvastatin   40 mg Oral QHS   busPIRone   15 mg Oral QHS   carvedilol   6.25 mg Oral BID WC   Chlorhexidine  Gluconate Cloth  6 each Topical Q0600   escitalopram   20 mg Oral Daily   gabapentin   100 mg Oral TID  heparin   5,000 Units Subcutaneous Q8H   hydrALAZINE   100 mg Oral Q8H   insulin  pump   Subcutaneous TID WC, HS, 0200   levETIRAcetam   500 mg Oral BID   levothyroxine   200 mcg Oral Q0600   metoCLOPramide   5 mg Oral BID WC   pantoprazole   40 mg Oral Daily   sevelamer  carbonate  1,600 mg Oral TID WC   sodium chloride  flush  3 mL Intravenous Q12H   sodium zirconium cyclosilicate   5 g Oral Daily   Continuous Infusions: PRN Meds:.acetaminophen  **OR** acetaminophen , albuterol , ondansetron  **OR** ondansetron  (ZOFRAN ) IV, senna-docusate   I have personally reviewed following labs and imaging studies  LABORATORY DATA: CBC: Recent Labs   Lab 07/18/24 1646 07/19/24 0254  WBC 7.2 8.3  NEUTROABS 4.6  --   HGB 11.5* 10.8*  HCT 38.0 36.7  MCV 90.9 91.5  PLT 166 181    Basic Metabolic Panel: Recent Labs  Lab 07/18/24 1646 07/19/24 0254 07/20/24 0732  NA 134* 134* 133*  K 5.4* 5.1 5.0  CL 92* 91* 90*  CO2 27 27 27   GLUCOSE 230* 152* 159*  BUN 32* 35* 24*  CREATININE 7.32* 7.69* 6.37*  CALCIUM  10.4* 9.7 10.7*  PHOS  --   --  7.2*    GFR: Estimated Creatinine Clearance: 10.2 mL/min (A) (by C-G formula based on SCr of 6.37 mg/dL (H)).  Liver Function Tests: Recent Labs  Lab 07/18/24 1646 07/20/24 0732  AST 21  --   ALT 17  --   ALKPHOS 112  --   BILITOT 0.8  --   PROT 7.2  --   ALBUMIN  4.1 3.7   No results for input(s): LIPASE, AMYLASE in the last 168 hours. No results for input(s): AMMONIA in the last 168 hours.  Coagulation Profile: No results for input(s): INR, PROTIME in the last 168 hours.  Cardiac Enzymes: No results for input(s): CKTOTAL, CKMB, CKMBINDEX, TROPONINI in the last 168 hours.  BNP (last 3 results) No results for input(s): PROBNP in the last 8760 hours.  Lipid Profile: No results for input(s): CHOL, HDL, LDLCALC, TRIG, CHOLHDL, LDLDIRECT in the last 72 hours.  Thyroid  Function Tests: No results for input(s): TSH, T4TOTAL, FREET4, T3FREE, THYROIDAB in the last 72 hours.  Anemia Panel: No results for input(s): VITAMINB12, FOLATE, FERRITIN, TIBC, IRON, RETICCTPCT in the last 72 hours.  Urine analysis:    Component Value Date/Time   COLORURINE YELLOW 02/11/2023 1148   APPEARANCEUR HAZY (A) 02/11/2023 1148   APPEARANCEUR Clear 04/30/2019 1141   LABSPEC 1.011 02/11/2023 1148   PHURINE 8.0 02/11/2023 1148   GLUCOSEU 50 (A) 02/11/2023 1148   GLUCOSEU 100 (A) 10/14/2009 2309   HGBUR NEGATIVE 02/11/2023 1148   BILIRUBINUR NEGATIVE 02/11/2023 1148   BILIRUBINUR Negative 04/30/2019 1141   BILIRUBINUR negative 12/30/2016  1617   KETONESUR NEGATIVE 02/11/2023 1148   PROTEINUR >=300 (A) 02/11/2023 1148   UROBILINOGEN 0.2 07/19/2018 1351   UROBILINOGEN 0.2 10/24/2015 0029   NITRITE NEGATIVE 02/11/2023 1148   LEUKOCYTESUR TRACE (A) 02/11/2023 1148    Sepsis Labs: Lactic Acid, Venous    Component Value Date/Time   LATICACIDVEN 1.8 05/04/2023 0139    MICROBIOLOGY: No results found for this or any previous visit (from the past 240 hours).  RADIOLOGY STUDIES/RESULTS: CT Angio Chest PE W and/or Wo Contrast Result Date: 07/18/2024 CLINICAL DATA:  Shortness of breath EXAM: CT ANGIOGRAPHY CHEST WITH CONTRAST TECHNIQUE: Multidetector CT imaging of the chest was performed using the standard protocol  during bolus administration of intravenous contrast. Multiplanar CT image reconstructions and MIPs were obtained to evaluate the vascular anatomy. RADIATION DOSE REDUCTION: This exam was performed according to the departmental dose-optimization program which includes automated exposure control, adjustment of the mA and/or kV according to patient size and/or use of iterative reconstruction technique. CONTRAST:  60mL OMNIPAQUE  IOHEXOL  350 MG/ML SOLN COMPARISON:  Chest x-ray from earlier in the same day. FINDINGS: Cardiovascular: Thoracic aorta and its branches are within normal limits. The heart is mildly enlarged in size. The pulmonary artery shows a normal branching pattern bilaterally. No intraluminal filling defect to suggest pulmonary embolism is seen. Multiple venous collaterals are noted in the right chest and neck consistent with a degree of right subclavian vein stenosis. Mediastinum/Nodes: Thoracic inlet is within normal limits. No hilar or mediastinal adenopathy is noted. The esophagus as visualized is within normal limits. Lungs/Pleura: Lungs are well aerated bilaterally. No focal infiltrate or sizable effusion is seen. No parenchymal nodule is noted. Mild scarring is noted in the left base. Upper Abdomen: Visualized  upper abdomen is unremarkable. Musculoskeletal: No acute bony abnormality is noted. Review of the MIP images confirms the above findings. IMPRESSION: No evidence of pulmonary emboli. 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 No other focal abnormality is noted. Electronically Signed   By: Oneil Devonshire M.D.   On: 07/18/2024 19:59   DG Chest 2 View Result Date: 07/18/2024 CLINICAL DATA:  Shortness of breath EXAM: CHEST - 2 VIEW COMPARISON:  Chest radiograph October 09, 2023 FINDINGS: Enlarged cardiomediastinal silhouette, similar to prior. No suspicious pulmonary nodule or consolidation. Previously seen patchy pulmonary opacities and pleural effusion is resolved. Right posterior upper ribs healed fractures. IMPRESSION: Cardiomegaly. Mild vascular congestion without significant opacity/airspace consolidation. Healed rib fractures. Electronically Signed   By: Megan  Zare M.D.   On: 07/18/2024 17:19     LOS: 1 day   Donalda Applebaum, MD  [facility]    To contact the attending provider between 7A-7P or the covering provider during after hours 7P-7A, please log into the web site [email or web address] and access using universal  password for that web site. If you do not have the password, please call the hospital operator.  07/20/2024, 10:35 AM

## 2024-07-20 NOTE — Progress Notes (Signed)
 02 Sat 91% on RA prior to ambulating.  Pt ambulated 2 loops around the nurses station with this RN on RA.  Pt indep, gait steady, no assistive devices.  02 Sat on arrival back to room was 91% on RA.  Pt denied SOB

## 2024-07-21 DIAGNOSIS — N186 End stage renal disease: Secondary | ICD-10-CM | POA: Diagnosis not present

## 2024-07-21 DIAGNOSIS — I152 Hypertension secondary to endocrine disorders: Secondary | ICD-10-CM | POA: Diagnosis not present

## 2024-07-21 DIAGNOSIS — E1159 Type 2 diabetes mellitus with other circulatory complications: Secondary | ICD-10-CM | POA: Diagnosis not present

## 2024-07-21 DIAGNOSIS — J9601 Acute respiratory failure with hypoxia: Secondary | ICD-10-CM | POA: Diagnosis not present

## 2024-07-21 LAB — GLUCOSE, CAPILLARY
Glucose-Capillary: 236 mg/dL — ABNORMAL HIGH (ref 70–99)
Glucose-Capillary: 247 mg/dL — ABNORMAL HIGH (ref 70–99)

## 2024-07-21 NOTE — Plan of Care (Signed)

## 2024-07-21 NOTE — Discharge Summary (Signed)
 PATIENT DETAILS Name: Angela [name] Age: 38 y.o. Sex: female Date of Birth: [birth year] MRN: [id number]. Admitting Physician: Leatrice LILLETTE Chapel, MD ERE:Gnywdnw, Barnie NOVAK, MD  Admit Date: 07/18/2024 Discharge date: 07/21/2024  Recommendations for Outpatient Follow-up:  Follow up with PCP in 1-2 weeks Please obtain CMP/CBC in one week  Admitted From:  Home  Disposition: Home   Discharge Condition: good  CODE STATUS:   Code Status: Full Code   Diet recommendation:  Diet Order             Diet - low sodium heart healthy           Diet Carb Modified           Diet renal/carb modified with fluid restriction Fluid consistency: Thin  Diet effective now                    Brief Summary: Patient is a 38 y.o.  female with history of ESRD on HD MWF, DM-1 on insulin  pump, HTN-who presented with shortness of breath-was found to have hypoxia-presented with thought to have volume overload and subsequently admitted to the hospitalist service.   Significant events: 7/31>> admit to [facility]   Significant studies: 7/31>> CTA chest: No PE   Significant microbiology data: None   Procedures: None   Consults: None  Brief Hospital Course: Acute hypoxic respiratory failure secondary to volume overload in the setting of ESRD Clinically improved-now on room air Underwent HD x 2 Stable for discharge today-resume usual HD schedule from tomorrow.  ESRD on HD MWF See above Nephrology followed closely   History of right subclavian vein stenosis with increased collaterals in the right chest wall/right neck Incidental finding on CT chest Unclear significance-will defer further workup to outpatient setting/nephrology.   Hyperkalemia Resolved with HD/Lokelma  Continue maintenance Lokelma .   DM-1 (A1c 8.7 on 7/21) CBG stable Continue insulin  pump   HTN BP stable Amlodipine /Coreg /hydralazine    HLD Statin   Seizure disorder Keppra    Hypothyroidism Synthroid     Prior history of cerebral hemorrhage At baseline-moving all 4 extremities   Depression/anxiety Stable BuSpar /Lexapro .   Discharge Diagnoses:  Principal Problem:   Acute respiratory failure with hypoxia (HCC) Active Problems:   Hypertension associated with diabetes (HCC)   ESRD on hemodialysis (HCC)   Hypothyroidism   Uncontrolled type 1 diabetes mellitus with hyperglycemia, with long-term current use of insulin  (HCC)   Mixed hyperlipidemia   Anxiety and depression   Seizure disorder (HCC)   Subclavian vein stenosis, right   Acute hypoxic respiratory failure [facility])   Discharge Instructions:  Activity:  As tolerated    Discharge Instructions     (HEART FAILURE PATIENTS) Call MD:  Anytime you have any of the following symptoms: 1) 3 pound weight gain in 24 hours or 5 pounds in 1 week 2) shortness of breath, with or without a dry hacking cough 3) swelling in the hands, feet or stomach 4) if you have to sleep on extra pillows at night in order to breathe.   Complete by: As directed    Diet - low sodium heart healthy   Complete by: As directed    Diet Carb Modified   Complete by: As directed    Discharge instructions   Complete by: As directed    Follow with Primary MD  Vicci Barnie NOVAK, MD in 1-2 weeks  Please get a complete blood count and chemistry panel checked by your Primary MD at your next visit, and again as instructed  by your Primary MD.  Get Medicines reviewed and adjusted: Please take all your medications with you for your next visit with your Primary MD  Laboratory/radiological data: Please request your Primary MD to go over all hospital tests and procedure/radiological results at the follow up, please ask your Primary MD to get all Hospital records sent to his/her office.  In some cases, they will be blood work, cultures and biopsy results pending at the time of your discharge. Please request that your primary care M.D. follows up on these results.  Also  Note the following: If you experience worsening of your admission symptoms, develop shortness of breath, life threatening emergency, suicidal or homicidal thoughts you must seek medical attention immediately by calling 911 or calling your MD immediately  if symptoms less severe.  You must read complete instructions/literature along with all the possible adverse reactions/side effects for all the Medicines you take and that have been prescribed to you. Take any new Medicines after you have completely understood and accpet all the possible adverse reactions/side effects.   Do not drive when taking Pain medications or sleeping medications (Benzodaizepines)  Do not take more than prescribed Pain, Sleep and Anxiety Medications. It is not advisable to combine anxiety,sleep and pain medications without talking with your primary care practitioner  Special Instructions: If you have smoked or chewed Tobacco  in the last 2 yrs please stop smoking, stop any regular Alcohol  and or any Recreational drug use.  Wear Seat belts while driving.  Please note: You were cared for by a hospitalist during your hospital stay. Once you are discharged, your primary care physician will handle any further medical issues. Please note that NO REFILLS for any discharge medications will be authorized once you are discharged, as it is imperative that you return to your primary care physician (or establish a relationship with a primary care physician if you do not have one) for your post hospital discharge needs so that they can reassess your need for medications and monitor your lab values.   Increase activity slowly   Complete by: As directed       Allergies as of 07/21/2024       Reactions   Iron Other (See Comments)   intolerance   Zestril  [lisinopril ] Other (See Comments)   Hyperkalemia    Pork-derived Products Other (See Comments)   Patient does not eat pork    Bactrim [sulfamethoxazole-trimethoprim] Hives, Itching    Feraheme [ferumoxytol] Itching        Medication List     STOP taking these medications    Tresiba  FlexTouch 100 UNIT/ML FlexTouch Pen Generic drug: insulin  degludec       TAKE these medications    albuterol  108 (90 Base) MCG/ACT inhaler Commonly known as: Proventil  HFA Inhale 2 puffs into the lungs every 6 (six) hours as needed for wheezing or shortness of breath.   amLODipine  10 MG tablet Commonly known as: NORVASC  Take 1 tablet (10 mg total) by mouth at bedtime.   Aranesp  (Albumin  Free) 100 MCG/0.5ML Sosy injection Generic drug: Darbepoetin Alfa  Inject 0.5 mLs (100 mcg total) into the skin every Friday at 6 PM.Strength: 100 MCG/0.5ML   atorvastatin  40 MG tablet Commonly known as: LIPITOR Take 1 tablet by mouth at bedtime.   busPIRone  15 MG tablet Commonly known as: BUSPAR  Take 1 tablet (15 mg total) by mouth at bedtime.   carvedilol  6.25 MG tablet Commonly known as: COREG  Take 1 tablet (6.25 mg total) by mouth 2 (  two) times daily with a meal.   cinacalcet  30 MG tablet Commonly known as: SENSIPAR  Take 30 mg by mouth every evening.   Dexcom G7 Sensor Misc Inject 1 Application into the skin. Every 10 days   DIALYVITE TABLET Tabs Take 1 tablet by mouth daily.   diltiazem  60 MG tablet Commonly known as: CARDIZEM  TAKE 1 TABLET(60 MG) BY MOUTH EVERY 6 HOURS   escitalopram  20 MG tablet Commonly known as: LEXAPRO  Take 1 tablet (20 mg total) by mouth daily.   furosemide  80 MG tablet Commonly known as: LASIX  Take 1 tablet by mouth daily.   gabapentin  100 MG capsule Commonly known as: NEURONTIN  TAKE 1 CAPSULE(100 MG) BY MOUTH THREE TIMES DAILY   hydrALAZINE  100 MG tablet Commonly known as: APRESOLINE  TAKE 1 TABLET(100 MG) BY MOUTH THREE TIMES DAILY   levETIRAcetam  500 MG tablet Commonly known as: KEPPRA  Take 1 tablet (500 mg total) by mouth 2 (two) times daily. With 250mg (1/2 tab) supplement after dialysis.   levothyroxine  200 MCG tablet Commonly  known as: SYNTHROID  TAKE 1 TABLET(200 MCG) BY MOUTH DAILY BEFORE BREAKFAST. STOP THE 175 MCG DOSE   Lokelma  5 g packet Generic drug: sodium zirconium cyclosilicate  Take 1 packet by mouth daily.   metoCLOPramide  5 MG tablet Commonly known as: REGLAN  Take 1 tablet (5 mg total) by mouth 2 (two) times daily with a meal.   NovoLOG  FlexPen 100 UNIT/ML FlexPen Generic drug: insulin  aspart Inject 2-3 Units into the skin 3 (three) times daily with meals. Per sliding scale   ondansetron  8 MG disintegrating tablet Commonly known as: ZOFRAN -ODT Take 1 tablet (8 mg total) by mouth every 8 (eight) hours as needed for nausea or vomiting.   pantoprazole  40 MG tablet Commonly known as: PROTONIX  TAKE 1 TABLET(40 MG) BY MOUTH DAILY   Pen Needles 31G X 8 MM Misc UAD   Renvela  800 MG tablet Generic drug: sevelamer  carbonate Take 1,600 mg by mouth 3 (three) times daily.        Follow-up Information     Vicci Barnie NOVAK, MD. Schedule an appointment as soon as possible for a visit in 1 week(s).   Specialty: Internal Medicine Contact information: [address] [place] [address] [phone]                Allergies  Allergen Reactions   Iron Other (See Comments)    intolerance   Zestril  [Lisinopril ] Other (See Comments)    Hyperkalemia    Pork-Derived Products Other (See Comments)    Patient does not eat pork    Bactrim [Sulfamethoxazole-Trimethoprim] Hives and Itching   Feraheme [Ferumoxytol] Itching     Other Procedures/Studies: CT Angio Chest PE W and/or Wo Contrast Result Date: 07/18/2024 CLINICAL DATA:  Shortness of breath EXAM: CT ANGIOGRAPHY CHEST WITH CONTRAST TECHNIQUE: Multidetector CT imaging of the chest was performed using the standard protocol during bolus administration of intravenous contrast. Multiplanar CT image reconstructions and MIPs were obtained to evaluate the vascular anatomy. RADIATION DOSE REDUCTION: This exam was performed  according to the departmental dose-optimization program which includes automated exposure control, adjustment of the mA and/or kV according to patient size and/or use of iterative reconstruction technique. CONTRAST:  60mL OMNIPAQUE  IOHEXOL  350 MG/ML SOLN COMPARISON:  Chest x-ray from earlier in the same day. FINDINGS: Cardiovascular: Thoracic aorta and its branches are within normal limits. The heart is mildly enlarged in size. The pulmonary artery shows a normal branching pattern bilaterally. No intraluminal filling defect to suggest  pulmonary embolism is seen. Multiple venous collaterals are noted in the right chest and neck consistent with a degree of right subclavian vein stenosis. Mediastinum/Nodes: Thoracic inlet is within normal limits. No hilar or mediastinal adenopathy is noted. The esophagus as visualized is within normal limits. Lungs/Pleura: Lungs are well aerated bilaterally. No focal infiltrate or sizable effusion is seen. No parenchymal nodule is noted. Mild scarring is noted in the left base. Upper Abdomen: Visualized upper abdomen is unremarkable. Musculoskeletal: No acute bony abnormality is noted. Review of the MIP images confirms the above findings. IMPRESSION: No evidence of pulmonary emboli. 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 No other focal abnormality is noted. Electronically Signed   By: Oneil Devonshire M.D.   On: 07/18/2024 19:59   DG Chest 2 View Result Date: 07/18/2024 CLINICAL DATA:  Shortness of breath EXAM: CHEST - 2 VIEW COMPARISON:  Chest radiograph October 09, 2023 FINDINGS: Enlarged cardiomediastinal silhouette, similar to prior. No suspicious pulmonary nodule or consolidation. Previously seen patchy pulmonary opacities and pleural effusion is resolved. Right posterior upper ribs healed fractures. IMPRESSION: Cardiomegaly. Mild vascular congestion without significant opacity/airspace consolidation. Healed rib fractures.  Electronically Signed   By: Megan  Zare M.D.   On: 07/18/2024 17:19     TODAY-DAY OF DISCHARGE:  Subjective:   Angela [name] today has no headache,no chest abdominal pain,no new weakness tingling or numbness, feels much better wants to go home today.   Objective:   Blood pressure (!) 167/80, pulse 84, temperature 98.2 F (36.8 C), temperature source Oral, resp. rate 14, height 5' 4 (1.626 m), weight 57.8 kg, SpO2 94%.  Intake/Output Summary (Last 24 hours) at 07/21/2024 0855 Last data filed at 07/20/2024 2110 Gross per 24 hour  Intake 363 ml  Output 3000 ml  Net -2637 ml   Filed Weights   07/20/24 0814 07/20/24 1203 07/21/24 0502  Weight: 59.7 kg 56.8 kg 57.8 kg    Exam: Awake Alert, Oriented *3, No new F.N deficits, Normal affect [place].AT,PERRAL Supple Neck,No JVD, No cervical lymphadenopathy appriciated.  Symmetrical Chest wall movement, Good air movement bilaterally, CTAB RRR,No Gallops,Rubs or new Murmurs, No Parasternal Heave +ve B.Sounds, Abd Soft, Non tender, No organomegaly appriciated, No rebound -guarding or rigidity. No Cyanosis, Clubbing or edema, No new Rash or bruise   PERTINENT RADIOLOGIC STUDIES: No results found.   PERTINENT LAB RESULTS: CBC: Recent Labs    07/18/24 1646 07/19/24 0254  WBC 7.2 8.3  HGB 11.5* 10.8*  HCT 38.0 36.7  PLT 166 181   CMET CMP     Component Value Date/Time   NA 133 (L) 07/20/2024 0732   NA 138 03/21/2023 1214   K 5.0 07/20/2024 0732   CL 90 (L) 07/20/2024 0732   CO2 27 07/20/2024 0732   GLUCOSE 159 (H) 07/20/2024 0732   BUN 24 (H) 07/20/2024 0732   BUN 37 (H) 03/21/2023 1214   CREATININE 6.37 (H) 07/20/2024 0732   CREATININE 2.31 (H) 02/04/2016 1703   CALCIUM  10.7 (H) 07/20/2024 0732   PROT 7.2 07/18/2024 1646   PROT 6.5 05/11/2023 1218   ALBUMIN  3.7 07/20/2024 0732   ALBUMIN  4.2 05/11/2023 1218   AST 21 07/18/2024 1646   ALT 17 07/18/2024 1646   ALKPHOS 112 07/18/2024 1646   BILITOT 0.8 07/18/2024 1646    BILITOT 0.4 05/11/2023 1218   GFR 8.85 (LL) 10/17/2023 0935   EGFR 10 (L) 03/21/2023 1214   GFRNONAA 8 (L) 07/20/2024 0732  GFRNONAA 28 (L) 02/04/2016 1703    GFR Estimated Creatinine Clearance: 10.2 mL/min (A) (by C-G formula based on SCr of 6.37 mg/dL (H)). No results for input(s): LIPASE, AMYLASE in the last 72 hours. No results for input(s): CKTOTAL, CKMB, CKMBINDEX, TROPONINI in the last 72 hours. Invalid input(s): POCBNP No results for input(s): DDIMER in the last 72 hours. No results for input(s): HGBA1C in the last 72 hours. No results for input(s): CHOL, HDL, LDLCALC, TRIG, CHOLHDL, LDLDIRECT in the last 72 hours. No results for input(s): TSH, T4TOTAL, T3FREE, THYROIDAB in the last 72 hours.  Invalid input(s): FREET3 No results for input(s): VITAMINB12, FOLATE, FERRITIN, TIBC, IRON, RETICCTPCT in the last 72 hours. Coags: No results for input(s): INR in the last 72 hours.  Invalid input(s): PT Microbiology: No results found for this or any previous visit (from the past 240 hours).  FURTHER DISCHARGE INSTRUCTIONS:  Get Medicines reviewed and adjusted: Please take all your medications with you for your next visit with your Primary MD  Laboratory/radiological data: Please request your Primary MD to go over all hospital tests and procedure/radiological results at the follow up, please ask your Primary MD to get all Hospital records sent to his/her office.  In some cases, they will be blood work, cultures and biopsy results pending at the time of your discharge. Please request that your primary care M.D. goes through all the records of your hospital data and follows up on these results.  Also Note the following: If you experience worsening of your admission symptoms, develop shortness of breath, life threatening emergency, suicidal or homicidal thoughts you must seek medical attention immediately by calling 911 or  calling your MD immediately  if symptoms less severe.  You must read complete instructions/literature along with all the possible adverse reactions/side effects for all the Medicines you take and that have been prescribed to you. Take any new Medicines after you have completely understood and accpet all the possible adverse reactions/side effects.   Do not drive when taking Pain medications or sleeping medications (Benzodaizepines)  Do not take more than prescribed Pain, Sleep and Anxiety Medications. It is not advisable to combine anxiety,sleep and pain medications without talking with your primary care practitioner  Special Instructions: If you have smoked or chewed Tobacco  in the last 2 yrs please stop smoking, stop any regular Alcohol  and or any Recreational drug use.  Wear Seat belts while driving.  Please note: You were cared for by a hospitalist during your hospital stay. Once you are discharged, your primary care physician will handle any further medical issues. Please note that NO REFILLS for any discharge medications will be authorized once you are discharged, as it is imperative that you return to your primary care physician (or establish a relationship with a primary care physician if you do not have one) for your post hospital discharge needs so that they can reassess your need for medications and monitor your lab values.  Total Time spent coordinating discharge including counseling, education and face to face time equals greater than 30 minutes.  SignedBETHA Donalda Applebaum 07/21/2024 8:55 AM

## 2024-07-21 NOTE — Discharge Planning (Signed)
 [facility] Patient Discharge Orders - [facility] CLINIC: [place]  Patient's name: Angela [name] Admit/DC Dates: 07/18/2024 - 07/21/24  DISCHARGE DIAGNOSES: Hypoxia/dyspnea with exertion    HD ORDER CHANGES: Heparin  change: no EDW Change: YES New EDW: 55kg -- keep challenging as tolerated Bath Change: no  ANEMIA MANAGEMENT: Aranesp : Given: no    ESA dose for discharge: resume prior schedule IV Iron dose at discharge: resume prior schedule Transfusion: Given: no  BONE/MINERAL MEDICATIONS: Hectorol/Calcitriol  change: YES - hold calcitriol  for hypercalcemia Sensipar /Parsabiv change: no  ACCESS INTERVENTION/CHANGE: no Details:  RECENT LABS: Recent Labs  Lab 07/19/24 0254 07/20/24 0732  HGB 10.8*  --   NA 134* 133*  K 5.1 5.0  CALCIUM  9.7 10.7*  PHOS  --  7.2*  ALBUMIN   --  3.7   IV ANTIBIOTICS: no Details:  OTHER ANTICOAGULATION: no   OTHER/APPTS/LAB ORDERS: - Pls check Ca, PTH this week to decide if ok to resume VDRA  D/C Meds to be reconciled by nurse after every discharge.  Completed By: Izetta Boehringer, PA-C [facility] Pager [phone]   Reviewed by: MD:______ RN_______

## 2024-07-21 NOTE — Progress Notes (Signed)
  [facility] Progress Note   Subjective:  Seen in room - breathing improved, plan is for discharge today. Will resume regular outpatient HD tomorrow.  Objective Vitals:   07/21/24 0008 07/21/24 0502 07/21/24 0504 07/21/24 0738  BP: (!) 168/90 (!) 154/88 (!) 154/88 (!) 167/80  Pulse: 81 85  84  Resp: 13 13  14   Temp: 97.6 F (36.4 C) 98.4 F (36.9 C)  98.2 F (36.8 C)  TempSrc: Oral Oral  Oral  SpO2: 92% 92%  94%  Weight:  57.8 kg    Height:       Physical Exam General: Well appearing woman, NAD. Room air. Heart: RRR  Lungs: CTAB Abdomen: soft Extremities: no LE edema Dialysis Access:  LUE AVF +t/b  Additional Objective Labs: Basic Metabolic Panel: Recent Labs  Lab 07/18/24 1646 07/19/24 0254 07/20/24 0732  NA 134* 134* 133*  K 5.4* 5.1 5.0  CL 92* 91* 90*  CO2 27 27 27   GLUCOSE 230* 152* 159*  BUN 32* 35* 24*  CREATININE 7.32* 7.69* 6.37*  CALCIUM  10.4* 9.7 10.7*  PHOS  --   --  7.2*   Liver Function Tests: Recent Labs  Lab 07/18/24 1646 07/20/24 0732  AST 21  --   ALT 17  --   ALKPHOS 112  --   BILITOT 0.8  --   PROT 7.2  --   ALBUMIN  4.1 3.7   CBC: Recent Labs  Lab 07/18/24 1646 07/19/24 0254  WBC 7.2 8.3  NEUTROABS 4.6  --   HGB 11.5* 10.8*  HCT 38.0 36.7  MCV 90.9 91.5  PLT 166 181   Medications:   amLODipine   10 mg Oral QHS   atorvastatin   40 mg Oral QHS   busPIRone   15 mg Oral QHS   carvedilol   6.25 mg Oral BID WC   Chlorhexidine  Gluconate Cloth  6 each Topical Q0600   escitalopram   20 mg Oral Daily   gabapentin   100 mg Oral TID   heparin   5,000 Units Subcutaneous Q8H   hydrALAZINE   100 mg Oral Q8H   insulin  pump   Subcutaneous TID WC, HS, 0200   levETIRAcetam   500 mg Oral BID   levothyroxine   200 mcg Oral Q0600   metoCLOPramide   5 mg Oral BID WC   pantoprazole   40 mg Oral Daily   sevelamer  carbonate  1,600 mg Oral TID WC   sodium chloride  flush  3 mL Intravenous Q12H   sodium zirconium cyclosilicate   5 g  Oral Daily    Dialysis Orders MWF - [facility] 3:45hr, 400/A1.5, EDW 59kg, 2K/2C bath, AVF, no heparin  - Mircera 150mcg IV q 2 weeks - last given 7/28 - Venofer 50mg  IV weekly - last given 7.30 - Calcitriol  1.5mcg PO q HD   Assessment/Plan:  Hypoxia/Dyspnea with exertion: CTA without PE, no pulm edema. Echo 2024 with normal LVEF but severe LVH. Trying to max UF with HD to improve volume.  ESRD: Usual MWF schedule - s/p extra HD 8/2 - improved symptoms.  Hypertension/volume: BP remains high. Keep lowering as outpatient. I don't have a post-HD weight - aiming for 55kg?  Anemia: Hgb 10.8 - not due for ESA  Metabolic bone disease: Ca up on admit, hold VDRA for now. Continue sevelamer  as binder.  T1DM: Insulin  per primary.   Hypothyroidism   Izetta Boehringer, PA-C 07/21/2024, 9:02 AM  [employer]

## 2024-07-21 NOTE — Plan of Care (Signed)
  Problem: Health Behavior/Discharge Planning: Goal: Ability to manage health-related needs will improve Outcome: Progressing   Problem: Clinical Measurements: Goal: Will remain free from infection Outcome: Progressing Goal: Respiratory complications will improve Outcome: Progressing   Problem: Coping: Goal: Level of anxiety will decrease Outcome: Progressing

## 2024-07-21 NOTE — Progress Notes (Signed)
Discharge completed by discharge RN.

## 2024-07-22 ENCOUNTER — Telehealth: Payer: Self-pay | Admitting: Nurse Practitioner

## 2024-07-22 ENCOUNTER — Telehealth: Payer: Self-pay | Admitting: *Deleted

## 2024-07-22 NOTE — Progress Notes (Signed)
 Late Note Entry- July 22, 2024  Pt was d/c yesterday. Contacted [facility] this morning to be advised of pt's d/c date and that pt should have resumed care today.   Randine Mungo Dialysis Navigator [phone]

## 2024-07-22 NOTE — Telephone Encounter (Signed)
 Transition of Care - Initial Contact after Hospitalization  Date of discharge:  07/21/24 Date of contact: 07/22/24  Method: Phone Spoke to: Patient  Patient contacted to discuss transition of care from recent inpatient hospitalization. Patient was admitted to [facility] from 7/31 to 07/21/24 discharge diagnosis of acute hypoxia and dyspnea on exertion  The discharge medication list was reviewed. Patient understands the changes and has no concerns.   Patient will return to his/her outpatient HD unit on:   No other concerns at this time.

## 2024-07-22 NOTE — Telephone Encounter (Signed)
 Transition of Care - Initial Contact after Hospitalization  Date of discharge:  07/21/24 Date of contact: 07/22/24  Method: Phone Spoke to: Patient  Patient contacted to discuss transition of care from recent inpatient hospitalization. Patient was admitted to [facility] from 07/18/24 to 07/21/24 discharge diagnosis of acute hypoxia with dyspnea on exertion  The discharge medication list was reviewed. Patient understands the changes and has no concerns.   Patient will return to his/her outpatient HD unit on: 07/22/24  No other concerns at this time.

## 2024-07-22 NOTE — Transitions of Care (Post Inpatient/ED Visit) (Signed)
   07/22/2024  Name: Angela [name] MRN: [id number] DOB: 12-29-1985  Today's TOC FU Call Status: Today's TOC FU Call Status:: Unsuccessful Call (1st Attempt) Unsuccessful Call (1st Attempt) Date: 07/22/24  Attempted to reach the patient regarding the most recent Inpatient/ED visit.  Follow Up Plan: Additional outreach attempts will be made to reach the patient to complete the Transitions of Care (Post Inpatient/ED visit) call.   Andrea Dimes RN, BSN [place]  [facility] RN Care Manager [phone]

## 2024-07-24 ENCOUNTER — Telehealth: Payer: Self-pay | Admitting: *Deleted

## 2024-07-24 NOTE — Transitions of Care (Post Inpatient/ED Visit) (Signed)
   07/24/2024  Name: Angela [name] MRN: [id number] DOB: 04-20-1986  Today's TOC FU Call Status: Today's TOC FU Call Status:: Unsuccessful Call (2nd Attempt) Unsuccessful Call (2nd Attempt) Date: 07/24/24  Attempted to reach the patient regarding the most recent Inpatient/ED visit.  Follow Up Plan: Additional outreach attempts will be made to reach the patient to complete the Transitions of Care (Post Inpatient/ED visit) call.   Andrea Dimes RN, BSN [place]  [facility] RN Care Manager [phone]

## 2024-07-25 ENCOUNTER — Telehealth: Payer: Self-pay | Admitting: *Deleted

## 2024-07-25 NOTE — Transitions of Care (Post Inpatient/ED Visit) (Signed)
 07/25/2024  Name: Angela [name] MRN: [id number] DOB: September 14, 1986  Today's TOC FU Call Status: Today's TOC FU Call Status:: Successful TOC FU Call Completed TOC FU Call Complete Date: 07/25/24 Patient's Name and Date of Birth confirmed.  Transition Care Management Follow-up Telephone Call Date of Discharge: 07/21/24 Discharge Facility: [facility]) Type of Discharge: Inpatient Admission Primary Inpatient Discharge Diagnosis:: Acute respiratory failure with hypoxia How have you been since you were released from the hospital?: Worse Any questions or concerns?: Yes Patient Questions/Concerns:: Patient's DPR/mother is concerned that patient has not recieved home Oxygen Patient Questions/Concerns Addressed: Notified Provider of Patient Questions/Concerns (Secure message sent to Dr. Newlin(Dr. Vicci is out of the office))  Items Reviewed: Did you receive and understand the discharge instructions provided?: Yes Medications obtained,verified, and reconciled?: Yes (Medications Reviewed) Any new allergies since your discharge?: No Dietary orders reviewed?: Yes Type of Diet Ordered:: low sodium heart healthy, renal/Carb Modified Do you have support at home?: Yes People in Home [RPT]: parent(s) Name of Support/Comfort Primary Source: DPR/Sandra  Medications Reviewed Today: Medications Reviewed Today     Reviewed by Lucky Andrea LABOR, RN (Registered Nurse) on 07/25/24 at 1552  Med List Status: <None>   Medication Order Taking? Sig Documenting Provider Last Dose Status Informant  adenosine  (diagnostic) (ADENOSCAN ) infusion 32.4 mg [id number]   Jeffrie Oneil BROCKS, MD  Active   albuterol  (PROVENTIL  HFA) 108 (90 Base) MCG/ACT inhaler [id number] Yes Inhale 2 puffs into the lungs every 6 (six) hours as needed for wheezing or shortness of breath. Vicci Barnie NOVAK, MD  Active Mother, Pharmacy Records           Med Note (SATTERFIELD, TEENA BRAVO   Fri May 05, 2023  8:50 PM)    amLODipine  (NORVASC ) 10 MG  tablet [id number] Yes Take 1 tablet (10 mg total) by mouth at bedtime. Vicci Barnie NOVAK, MD  Active Mother, Pharmacy Records  atorvastatin  (LIPITOR) 40 MG tablet [id number]  Take 1 tablet by mouth at bedtime.  Patient not taking: Reported on 07/25/2024   [provider]  Active Mother, Pharmacy Records  B Complex-C-Folic Acid  (DIALYVITE TABLET) TABS [id number] Yes Take 1 tablet by mouth daily. [provider]  Active Mother, Pharmacy Records  busPIRone  (BUSPAR ) 15 MG tablet [id number] Yes Take 1 tablet (15 mg total) by mouth at bedtime. Vicci Barnie NOVAK, MD  Active Mother, Pharmacy Records  carvedilol  (COREG ) 6.25 MG tablet [id number] Yes Take 1 tablet (6.25 mg total) by mouth 2 (two) times daily with a meal. Vicci Barnie NOVAK, MD  Active Mother, Pharmacy Records  cinacalcet  (SENSIPAR ) 30 MG tablet [id number] Yes Take 30 mg by mouth every evening. [provider]  Active Mother, Pharmacy Records  Continuous Glucose Sensor (DEXCOM G7 SENSOR) [place] [id number] Yes Inject 1 Application into the skin. Every 10 days [provider]  Active Mother, Pharmacy Records  Darbepoetin Alfa  (ARANESP , ALBUMIN  FREE,) 100 MCG/0.5ML SOSY injection [id number] Yes Inject 0.5 mLs (100 mcg total) into the skin every Friday at 6 PM.Strength: 100 MCG/0.5ML Vicci Barnie NOVAK, MD  Active Mother, Pharmacy Records  diltiazem  (CARDIZEM ) 60 MG tablet [id number] Yes TAKE 1 TABLET(60 MG) BY MOUTH EVERY 6 HOURS Vicci Barnie NOVAK, MD  Active Mother, Pharmacy Records  escitalopram  (LEXAPRO ) 20 MG tablet [id number] Yes Take 1 tablet (20 mg total) by mouth daily. Vicci Barnie NOVAK, MD  Active Mother, Pharmacy Records  furosemide  (LASIX ) 80 MG tablet [id number] Yes Take 1 tablet by mouth daily. [provider]  Active Mother,  Pharmacy Records  gabapentin  (NEURONTIN ) 100 MG capsule [id number] Yes TAKE 1 CAPSULE(100 MG) BY MOUTH THREE TIMES DAILY Vicci Barnie NOVAK, MD  Active Mother, Pharmacy Records   hydrALAZINE  (APRESOLINE ) 100 MG tablet [id number] Yes TAKE 1 TABLET(100 MG) BY MOUTH THREE TIMES DAILY Vicci Barnie NOVAK, MD  Active Mother, Pharmacy Records  insulin  aspart (NOVOLOG  FLEXPEN) 100 UNIT/ML FlexPen [id number] Yes Inject 2-3 Units into the skin 3 (three) times daily with meals. Per sliding scale Vicci Barnie NOVAK, MD  Active Mother, Pharmacy Records  Insulin  Pen Needle (PEN NEEDLES) 31G X 8 MM MISC [id number] Yes UAD Vicci Barnie NOVAK, MD  Active Mother, Pharmacy Records  levETIRAcetam  (KEPPRA ) 500 MG tablet [id number] Yes Take 1 tablet (500 mg total) by mouth 2 (two) times daily. With 250mg (1/2 tab) supplement after dialysis. Vicci Barnie NOVAK, MD  Active Mother, Pharmacy Records  levothyroxine  (SYNTHROID ) 200 MCG tablet [id number] Yes TAKE 1 TABLET(200 MCG) BY MOUTH DAILY BEFORE BREAKFAST. STOP THE 175 MCG DOSE Vicci Barnie NOVAK, MD  Active Mother, Pharmacy Records  LOKELMA  5 g packet [id number] Yes Take 1 packet by mouth daily. [provider]  Active Mother, Pharmacy Records  metoCLOPramide  (REGLAN ) 5 MG tablet [id number] Yes Take 1 tablet (5 mg total) by mouth 2 (two) times daily with a meal. Vicci Barnie NOVAK, MD  Active Mother, Pharmacy Records  ondansetron  (ZOFRAN -ODT) 8 MG disintegrating tablet [id number] Yes Take 1 tablet (8 mg total) by mouth every 8 (eight) hours as needed for nausea or vomiting. Ines Onetha NOVAK, MD  Active Mother, Pharmacy Records  pantoprazole  (PROTONIX ) 40 MG tablet [id number] Yes TAKE 1 TABLET(40 MG) BY MOUTH DAILY Vicci Barnie NOVAK, MD  Active Mother, Pharmacy Records  RENVELA  800 MG tablet [id number] Yes Take 1,600 mg by mouth 3 (three) times daily. [provider]  Active Mother, Pharmacy Records            Home Care and Equipment/Supplies: Were Home Health Services Ordered?: No Any new equipment or medical supplies ordered?: No  Functional Questionnaire: Do you need assistance with bathing/showering or dressing?: No Do you  need assistance with meal preparation?: Yes (mother prepares meals) Do you need assistance with eating?: No Do you have difficulty maintaining continence: No Do you need assistance with getting out of bed/getting out of a chair/moving?: No Do you have difficulty managing or taking your medications?: Yes (mother manages medications)  Follow up appointments reviewed: PCP Follow-up appointment confirmed?: No (Patient is scheduled on 08/23/24, DPR/Mother will call for an earlier appointment) MD Provider Line Number:[phone] Given: No Specialist Hospital Follow-up appointment confirmed?: NA Do you need transportation to your follow-up appointment?: No Do you understand care options if your condition(s) worsen?: Yes-patient verbalized understanding  SDOH Interventions Today    Flowsheet Row Most Recent Value  SDOH Interventions   Food Insecurity Interventions Intervention Not Indicated  Housing Interventions Intervention Not Indicated  Transportation Interventions Intervention Not Indicated  Utilities Interventions Intervention Not Indicated    Goals Addressed             This Visit's Progress    VBCI Transitions of Care (TOC) Care Plan       Problems:  Recent Hospitalization for treatment of Acute respiratory failure Equipment/DME barrier Patient has not received home Oxygen   Goal:  Over the next 30 days, the patient will not experience hospital readmission  Interventions:  Transitions of Care: Durable Medical Equipment (DME) reviewed with patient/caregiver Communication with PCP office requesting order for Oxygen to be signed or  new order placed to [facility] re: home Oxygen Advised patient and DPR to call and schedule earlier appointment with PCP, currently scheduled on 08/23/24-DPR will call today Discussed and encouraged follow up with Neurology-patient's last visit was in 09/2024 Medication review Advised DPR to encourage patient to take deep breaths, if O2 does not reach 90%  contact Provider or return to ED Advised breathing exercises and movement during the day  Patient Self Care Activities:  Attend all scheduled provider appointments Call provider office for new concerns or questions  Notify RN Care Manager of [facility] call rescheduling needs Participate in Transition of Care Program/Attend TOC scheduled calls Take medications as prescribed   Breathing exercises 3-5 times a day  Plan:  Telephone follow up appointment with care management team member scheduled for:  07/26/24 at 2pm for TOC week 1 care coordination        Andrea Dimes RN, BSN [place]  [facility] Health RN Care Manager [phone]

## 2024-07-26 ENCOUNTER — Telehealth: Payer: Self-pay | Admitting: Internal Medicine

## 2024-07-26 ENCOUNTER — Other Ambulatory Visit (INDEPENDENT_AMBULATORY_CARE_PROVIDER_SITE_OTHER): Payer: Self-pay | Admitting: Primary Care

## 2024-07-26 ENCOUNTER — Other Ambulatory Visit: Payer: MEDICAID | Admitting: *Deleted

## 2024-07-26 ENCOUNTER — Telehealth: Payer: Self-pay | Admitting: Pharmacist

## 2024-07-26 ENCOUNTER — Telehealth (INDEPENDENT_AMBULATORY_CARE_PROVIDER_SITE_OTHER): Payer: Self-pay | Admitting: Primary Care

## 2024-07-26 DIAGNOSIS — J9601 Acute respiratory failure with hypoxia: Secondary | ICD-10-CM

## 2024-07-26 NOTE — Telephone Encounter (Signed)
 Called & spoke to the patient's mom. Patient's mom stated that the oxygen order was submitted through the hospital. [facility] called & spoke to the patient's mom and they will be delivering the oxygen to the patient's home today Friday 07/26/2024. No further assistance is need at this time.

## 2024-07-26 NOTE — Telephone Encounter (Signed)
 This morning, I had [place] fax over Dr Ferdie office notes along with the order And a fax confirmation was received.

## 2024-07-26 NOTE — Telephone Encounter (Signed)
 Routing information to provider covering for Dr. Vicci. Received note from Andrea Dimes, RN CM requesting the following:   -This patient needs an order for Oxygen sent to [facility].  -Dr. Vicci placed the order on 07/18/24.  -However, this order is not sufficient. Per [facility], a new order needs to be sent that includes, continuous or at night in the instructions.  -Of note, [facility] also requests OV notes from 07/18/2024 office visit with Dr. Ferdie signature before it can approve (the office visit notes must include pulse ox). The note from 7/31 includes a pulse ox.   Forwarding to Dr. Newlin to see if she is willing to fax the order with updates needed for filling.

## 2024-07-26 NOTE — Telephone Encounter (Signed)
 A new order is needed saying continuous O2 use

## 2024-07-26 NOTE — Telephone Encounter (Cosign Needed)
 Nurse TB received a called from case manager for orders for oxygen. PCP unavailable. If orders for O2 was not sent patient would need hospitalization. Per nurse oxygen on room air 80's . Spoke with Dr Jegede he will cosign orders for 02. Faxed orders to [facility] [phone]  Continuous  Needs O2 2 L with ambulation  Pulse ox on room air at rest is between 91 to 95%.  Pulse ox room air on ambulation 83 to 85% with mild dyspnea  Pox 2 L O2 at rest 99%, with ambulation 97-99%

## 2024-07-26 NOTE — Telephone Encounter (Signed)
 Disregard previous message

## 2024-07-26 NOTE — Transitions of Care (Post Inpatient/ED Visit) (Signed)
   07/26/2024  Name: Angela [name] MRN: [id number] DOB: 1986-08-23  RNCM spoke with DPR/Angela [name]. Patient has not received home oxygen. Call made to [facility]. A new order is needed to include continuous or nocturnal and include a pulse ox with a F2F signed by the MD. Communication made to [facility] to request new order be faxed to sent via Parach[employer]. RNCM notified that a provider was not available to order oxygen. RNCM collaborated with Pharmacist, Angela [name]-he is unable to order oxygen. Collaboration with [facility] for assistance-return call from [place], Charity fundraiser. She and Angela Bohr, NP will send order for home oxygen to [facility]. RNCM Angela [name], will follow up with patient on 07/29/24.  Angela Dimes RN, BSN [place]  [facility] Health RN Care Manager [phone]

## 2024-07-26 NOTE — Telephone Encounter (Signed)
 Copied from CRM [id number]. Topic: General - Other >> Jul 26, 2024  2:03 PM Yolanda T wrote: Reason for CRM: Melanie Nurse Case Mgr called to speak with nurse or provider to ensure patient gets her oxygen before the end of the day. Andrea says the order is already in but needs to fax the face to face from 7/31 and the order needs to specify if the oxygen should be at night or continuous. Patient really needs this oxygen before the weekend. Please fax to [facility] [phone] or It can be sent via parachute.

## 2024-07-29 ENCOUNTER — Other Ambulatory Visit: Payer: MEDICAID

## 2024-07-29 NOTE — Patient Instructions (Signed)
 Visit Information  Thank you for taking time to visit with me today. Please don't hesitate to contact me if I can be of assistance to you before our next scheduled telephone appointment.  Our next appointment is by telephone on 08/06/24 at 1100 am  Following is a copy of your care plan:   Goals Addressed             This Visit's Progress    VBCI Transitions of Care (TOC) Care Plan       Problems:  Recent Hospitalization for treatment of Acute respiratory failure Patient received oxygen on 07/26/24  Goal:  Over the next 30 days, the patient will not experience hospital readmission  Interventions:  Transitions of Care: Durable Medical Equipment (DME) reviewed with patient/caregiver   Medication review Advised DPR to encourage patient to take deep breaths Advised breathing exercises and movement during the day DPR waiting on PCP office to call today with closer follow up appointment.  She states she will call if she does not get a return call today.    Patient Self Care Activities:  Attend all scheduled provider appointments Call provider office for new concerns or questions  Notify RN Care Manager of TOC call rescheduling needs Participate in Transition of Care Program/Attend TOC scheduled calls Take medications as prescribed   Breathing exercises 3-5 times a day  Plan:  Telephone follow up appointment with care management team member scheduled for:  08/06/24 at 1100 am for TOC week 3 with Andrea, RN        Patient verbalizes understanding of instructions and care plan provided today and agrees to view in MyChart. Active MyChart status and patient understanding of how to access instructions and care plan via MyChart confirmed with patient.     The patient has been provided with contact information for the care management team and has been advised to call with any health related questions or concerns.   Please call the care guide team at [phone] if you need to cancel  or reschedule your appointment.   Please call the Suicide and Crisis Lifeline: 988 if you are experiencing a Mental Health or Behavioral Health Crisis or need someone to talk to.  Farheen Pfahler J. Marshea Wisher RN, MSN [facility], [facility] Health RN Care Manager Direct Dial : [phone]  Fax: [phone] Website: [email or web address]
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
## 2024-07-30 ENCOUNTER — Other Ambulatory Visit: Payer: Self-pay | Admitting: Internal Medicine

## 2024-08-02 ENCOUNTER — Other Ambulatory Visit: Payer: Self-pay | Admitting: Internal Medicine

## 2024-08-05 ENCOUNTER — Telehealth: Payer: MEDICAID

## 2024-08-06 ENCOUNTER — Other Ambulatory Visit: Payer: MEDICAID | Admitting: *Deleted
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
## 2024-08-06 NOTE — Patient Instructions (Signed)
 Visit Information  Thank you for taking time to visit with me today. Please don't hesitate to contact me if I can be of assistance to you before our next scheduled telephone appointment.   Following is a copy of your care plan:   Goals Addressed             This Visit's Progress    [facility] Transitions of Care (TOC) Care Plan       Problems:  Recent Hospitalization for treatment of Acute respiratory failure Patient received oxygen on 07/26/24  Goal:  Over the next 30 days, the patient will not experience hospital readmission  Interventions:  Transitions of Care: Durable Medical Equipment (DME) reviewed with patient/caregiver Medication review-discussed the importance of taking lasix  and keppra  as prescribed Advised DPR to encourage patient to take deep breaths Advised breathing exercises and movement during the day Reviewed upcoming appointment with Neurology on 08/08/24, verified patient has transportation    Patient Self Care Activities:  Attend all scheduled provider appointments Call provider office for new concerns or questions  Notify RN Care Manager of [facility] call rescheduling needs Participate in Transition of Care Program/Attend TOC scheduled calls Take medications as prescribed   Breathing exercises 3-5 times a day  Plan:  Telephone follow up appointment with care management team member scheduled for:  08/13/24 at 12:30 pm        Patient verbalizes understanding of instructions and care plan provided today and agrees to view in MyChart. Active MyChart status and patient understanding of how to access instructions and care plan via MyChart confirmed with patient.     Telephone follow up appointment with care management team member scheduled for:08/13/24 at 12:30 pm  Please call the care guide team at [phone] if you need to cancel or reschedule your appointment.   Please call [phone]-TALK (toll free, 24 hour hotline) go to [facility]  [facility] [address] [place] [id number]) call 911 if you are experiencing a Mental Health or Behavioral Health Crisis or need someone to talk to.  Andrea Dimes RN, BSN [place]  [facility] RN Care Manager [phone]

## 2024-08-07 NOTE — Patient Instructions (Signed)
 Below is our plan:  We will continue levetiracetam  500mg  twice daily with an extra 250mg  dose on dialysis days. We will send referral to physical therapy. Please continue close follow up with your care team.   Goals:  1) Maintain strict control of hypertension with blood pressure goal below 130/90 2) Maintain good control of diabetes with hemoglobin A1c goal below 7%  3) Maintain good control of lipids with LDL cholesterol goal below 70 mg/dL.  4) Eat a healthy diet with plenty of whole grains, cereals, fruits and vegetables, exercise regularly and maintain ideal body weight   Resources: [email or web address].biz/  Please make sure you are staying well hydrated. I recommend 50-60 ounces daily. Well balanced diet and regular exercise encouraged. Consistent sleep schedule with 6-8 hours recommended.   Please continue follow up with care team as directed.   Follow up with me in 6 months   You may receive a survey regarding today's visit. I encourage you to leave honest feed back as I do use this information to improve patient care. Thank you for seeing me today!
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
## 2024-08-08 ENCOUNTER — Encounter: Payer: Self-pay | Admitting: Family Medicine

## 2024-08-08 ENCOUNTER — Ambulatory Visit (INDEPENDENT_AMBULATORY_CARE_PROVIDER_SITE_OTHER): Payer: MEDICAID | Admitting: Family Medicine

## 2024-08-08 VITALS — BP 115/68 | HR 68 | Ht 64.0 in | Wt 130.0 lb

## 2024-08-08 DIAGNOSIS — G40909 Epilepsy, unspecified, not intractable, without status epilepticus: Secondary | ICD-10-CM

## 2024-08-08 DIAGNOSIS — Z8679 Personal history of other diseases of the circulatory system: Secondary | ICD-10-CM | POA: Diagnosis not present

## 2024-08-08 DIAGNOSIS — M5412 Radiculopathy, cervical region: Secondary | ICD-10-CM | POA: Diagnosis not present

## 2024-08-08 DIAGNOSIS — G629 Polyneuropathy, unspecified: Secondary | ICD-10-CM

## 2024-08-08 DIAGNOSIS — R2689 Other abnormalities of gait and mobility: Secondary | ICD-10-CM | POA: Diagnosis not present

## 2024-08-08 DIAGNOSIS — G5623 Lesion of ulnar nerve, bilateral upper limbs: Secondary | ICD-10-CM

## 2024-08-13 ENCOUNTER — Telehealth: Payer: Self-pay | Admitting: Internal Medicine

## 2024-08-13 ENCOUNTER — Encounter: Payer: Self-pay | Admitting: *Deleted

## 2024-08-13 ENCOUNTER — Telehealth: Payer: MEDICAID | Admitting: *Deleted

## 2024-08-13 NOTE — Telephone Encounter (Signed)
 Called patient to confirm upcoming appointment 08/15/2024, no answer. Unable to leave VM.

## 2024-08-14 ENCOUNTER — Telehealth: Payer: Self-pay | Admitting: *Deleted
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
## 2024-08-14 NOTE — Patient Instructions (Signed)
 Visit Information  Thank you for taking time to visit with me today. Please don't hesitate to contact me if I can be of assistance to you before our next scheduled telephone appointment.   Following is a copy of your care plan:   Goals Addressed             This Visit's Progress    VBCI Transitions of Care (TOC) Care Plan       Problems:  Recent Hospitalization for treatment of Acute respiratory failure Patient received oxygen on 07/26/24  Goal:  Over the next 30 days, the patient will not experience hospital readmission  Interventions:  Transitions of Care: Durable Medical Equipment (DME) reviewed with patient/caregiver Medication review Discussed pending PT referral, advised DPR to contact ordering provider if not scheduled in one week Reviewed upcoming appointment with PCP on 08/15/24, verified patient has transportation    Patient Self Care Activities:  Attend all scheduled provider appointments Call provider office for new concerns or questions  Notify RN Care Manager of [facility] call rescheduling needs Participate in Transition of Care Program/Attend TOC scheduled calls Take medications as prescribed   Breathing exercises 3-5 times a day  Plan:  Telephone follow up appointment with care management team member scheduled for:  08/22/24 at 12:45 pm        Patient verbalizes understanding of instructions and care plan provided today and agrees to view in MyChart. Active MyChart status and patient understanding of how to access instructions and care plan via MyChart confirmed with patient.     Telephone follow up appointment with care management team member scheduled for:08/22/24 at 12:45pm  Please call the care guide team at [phone] if you need to cancel or reschedule your appointment.   Please call [phone]-TALK (toll free, 24 hour hotline) go to [facility] [address], [place] [phone]) call 911 if you are experiencing a  Mental Health or Behavioral Health Crisis or need someone to talk to.  Andrea Dimes RN, BSN [place]  [facility] RN Care Manager [phone]

## 2024-08-15 ENCOUNTER — Ambulatory Visit: Payer: MEDICAID | Attending: Internal Medicine | Admitting: Internal Medicine

## 2024-08-15 VITALS — BP 138/77 | HR 72 | Temp 97.9°F | Ht 64.0 in | Wt 128.0 lb

## 2024-08-15 DIAGNOSIS — Z87891 Personal history of nicotine dependence: Secondary | ICD-10-CM

## 2024-08-15 DIAGNOSIS — I871 Compression of vein: Secondary | ICD-10-CM

## 2024-08-15 DIAGNOSIS — J9611 Chronic respiratory failure with hypoxia: Secondary | ICD-10-CM | POA: Diagnosis not present

## 2024-08-15 DIAGNOSIS — B3731 Acute candidiasis of vulva and vagina: Secondary | ICD-10-CM

## 2024-08-15 DIAGNOSIS — I120 Hypertensive chronic kidney disease with stage 5 chronic kidney disease or end stage renal disease: Secondary | ICD-10-CM | POA: Diagnosis not present

## 2024-08-15 DIAGNOSIS — F330 Major depressive disorder, recurrent, mild: Secondary | ICD-10-CM

## 2024-08-15 DIAGNOSIS — Z992 Dependence on renal dialysis: Secondary | ICD-10-CM

## 2024-08-15 DIAGNOSIS — Z09 Encounter for follow-up examination after completed treatment for conditions other than malignant neoplasm: Secondary | ICD-10-CM

## 2024-08-15 DIAGNOSIS — E1029 Type 1 diabetes mellitus with other diabetic kidney complication: Secondary | ICD-10-CM

## 2024-08-15 DIAGNOSIS — L299 Pruritus, unspecified: Secondary | ICD-10-CM

## 2024-08-15 DIAGNOSIS — N186 End stage renal disease: Secondary | ICD-10-CM

## 2024-08-15 DIAGNOSIS — Z23 Encounter for immunization: Secondary | ICD-10-CM

## 2024-08-15 DIAGNOSIS — Z9981 Dependence on supplemental oxygen: Secondary | ICD-10-CM

## 2024-08-15 MED ORDER — FLUCONAZOLE 150 MG PO TABS: 150.0000 mg | ORAL_TABLET | Freq: Every day | ORAL | 0 refills | Status: DC

## 2024-08-15 NOTE — Patient Instructions (Signed)
 VISIT SUMMARY:  You had a follow-up appointment today after your recent hospitalization for low oxygen levels. We discussed your ongoing health issues, including your need for continuous oxygen, dialysis for end-stage renal disease, blood pressure management, diabetes, neuropathic pain, seizure disorder, and depression. We also addressed your recent vaginal yeast infection.  YOUR PLAN:  -CHRONIC RESPIRATORY FAILURE WITH HYPOXIA: Chronic respiratory failure with hypoxia means your body is not getting enough oxygen. We will refer you to a lung specialist to evaluate your low oxygen levels and determine if you need a higher oxygen level. We will also check your oxygen level while you walk using 2 liters of oxygen.  -END STAGE RENAL DISEASE ON DIALYSIS: End-stage renal disease means your kidneys are no longer working well enough to keep you alive without dialysis. You should continue attending your dialysis sessions regularly.  -HYPERTENSION IN THE SETTING OF END STAGE RENAL DISEASE: Hypertension is high blood pressure. Your blood pressure varies, especially after dialysis. On dialysis days, take only two blood pressure medications if your blood pressure is low after dialysis. On non-dialysis days, take all your prescribed blood pressure medications.  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 Your last A1c was 8.7, indicating your blood sugar levels have been high. We will check your cholesterol level and give you a flu shot today. Follow up with your endocrinologist in October.  -PERIPHERAL NEUROPATHY: Peripheral neuropathy is nerve damage that causes pain, often in the feet and hands. Your foot pain at night is likely due to this. We will increase your gabapentin  dose to 200 mg at night to help manage the pain.  -SEIZURE DISORDER: A seizure disorder means you have episodes of abnormal brain activity. Continue taking  Keppra  500 mg twice a day and an additional 250 mg on dialysis days.  -COMPRESSION OF RIGHT SUBCLAVIAN VEIN: This means there is a narrowing in the vein under your collarbone, likely from a previous catheter. We will refer you to a vascular surgeon to evaluate this.  -VULVOVAGINAL CANDIDIASIS: This is a yeast infection in the vaginal area. We will prescribe Diflucan  to treat the infection.  -DEPRESSION: Depression is a mood disorder that causes persistent feelings of sadness. You are finding Lexapro  helpful. Please try to contact counseling services again for additional support.  -GENERAL HEALTH MAINTENANCE: You are due for a diabetic eye exam. We will submit a referral for this and give you a flu shot today.  INSTRUCTIONS:  Please follow up with the pulmonologist and vascular surgeon as referred. Continue attending your dialysis sessions and take your medications as prescribed. Follow up with your endocrinologist in October and get your diabetic eye exam. If you have any new symptoms or concerns, please contact our office.

## 2024-08-15 NOTE — Progress Notes (Signed)
 Patient ID: Angela [name], female    DOB: 10-23-1986  MRN: [id number]  CC: Hospitalization Follow-up (Hospitalization f/u. Sanjuana recommends increasing gabapentin  for night time. /Itching, vaginal d/c X2-3 weeks/Dialysis recommends cream to help with kidney itchiness [name] to flu vax )   Subjective: Angela [name] is a 38 y.o. female who presents for chronic ds management and hosp f/u. Mom is with her. Her concerns today include:  Pt with hx of DM type 1 with neuropathy, gastroparesis and nephropathy, HTN, ESRD on HD, hemorrhagic stroke 01/2023 , ACD/IDA (on Aranesp  inj weekly), hypothyroid, HL, migraines, thyroid  nodules (12/2022 at [facility] bx of isthmus nodule and RLL path -follicular lesion of undetermined significance, neg Afirma), MDD   Discussed the use of AI scribe software for clinical note transcription with the patient, who gave verbal consent to proceed.  History of Present Illness Angela [name] is a 38 year old female with chronic hypoxia and end-stage renal disease on dialysis who presents for follow-up after recent hospitalization.  She was hospitalized at the end of July due to low oxygen levels and required dialysis on the same day. CTA chest neg for P.E. Incidental finding was RT subclavian vein stenosis with increased collaterals in the RT chest wall and neck. She was discharged with supplemental oxygen, which she uses continuously. Her oxygen saturation without oxygen drops to the 70s and 80s, and once reached as low as 66 while sleeping. With oxygen, her levels are in the 80s during the day per her mother. She uses two liters of oxygen continuously.  Sz disorder: She has a history of seizure disorder and is currently on Keppra  500 mg twice a day, with an additional 250 mg on dialysis days. She also takes gabapentin  100 mg three times a day for neuropathic pain, which is particularly bothersome at night in the back of her RT foot. The pain keeps her restless at night. Recently saw  neurology who recommend that she speaks with me about increasing dose of Neurontin .  HTN/ESRD on HD:Her blood pressure has been variable, with readings sometimes as low as 114/53 and as high as 163/102. She takes her blood pressure medications regularly, but her caregiver withholds them if her blood pressure is low. Her blood pressure was previously uncontrolled before her stroke. Still on carvedilol  6.25 mg twice a day, Cardizem  4 times a day, hydralazine  100 mg 3 times a day, amlodipine  10 mg daily.   She has a history of diabetes and uses an insulin  pump. Her last A1c was 8.7, and she is due for a follow-up with her endocrinologist in October. She is also due for a diabetic eye exam.  She is currently taking Lexapro  for depression, which she finds helpful. She has not pursued counseling due to availability issues.  She reports a vaginal yeast infection and requests treatment for it.     Patient Active Problem List   Diagnosis Date Noted   Acute hypoxic respiratory failure (HCC) 07/19/2024   Subclavian vein stenosis, right 07/18/2024   Liver cirrhosis secondary to NASH (HCC) 04/04/2024   History of cerebral hemorrhage 10/11/2023   Acute hyperkalemia 10/10/2023   Steatosis (HCC) 08/22/2023   Malnutrition of moderate degree 08/10/2023   Acute respiratory failure with hypoxia (HCC) 08/07/2023   Numbness and tingling in right hand 08/01/2023   Chronic neck pain with abnormal neurologic examination 08/01/2023   Cervical radiculopathy at C8 08/01/2023   Proliferative diabetic retinopathy of both eyes with macular edema associated with type 1 diabetes mellitus (  HCC) 05/11/2023   Protein calorie malnutrition (HCC) 02/15/2023   Seizure disorder (HCC) 10/15/2021   ESRD on hemodialysis (HCC) 10/15/2021   Mallory-Weiss tear 11/16/2020   Chronic migraine without aura without status migrainosus, not intractable 04/09/2020   Occipital neuralgia of right side 04/09/2020   Anxiety about health  10/29/2019   Cannabis abuse, episodic 10/29/2019   Abnormal uterine bleeding (AUB) 08/13/2019   Ovarian mass, left 11/27/2018   Moderate major depression (HCC) 11/27/2018   Secondary hyperparathyroidism (HCC) 05/01/2018   Anxiety and depression 06/23/2017   Insomnia 01/12/2016   Non compliance with medical treatment 01/05/2016   Mixed hyperlipidemia 01/05/2016   Uncontrolled type 1 diabetes mellitus with hyperglycemia, with long-term current use of insulin  (HCC) 12/10/2015   Neurogenic bladder 02/20/2015   Diabetic peripheral neuropathy associated with type 1 diabetes mellitus (HCC) 02/20/2015   Iron deficiency 02/13/2015   Asthma 09/30/2014   Diabetic retinopathy (HCC) 09/30/2014   Atypical squamous cells of undetermined significance (ASCUS) on Papanicolaou smear of cervix 08/11/2014   Diabetic gastroparesis associated with type 1 diabetes mellitus (HCC) 08/05/2014   Anemia in chronic renal disease 08/05/2014   Hypertension associated with diabetes (HCC) 07/11/2012   GERD (gastroesophageal reflux disease) 09/26/2011   Hypothyroidism 09/14/2006   Uncontrolled type 1 diabetes mellitus with hypoglycemia, with long-term current use of insulin  (HCC) 01/15/2000     Current Outpatient Medications on File Prior to Visit  Medication Sig Dispense Refill   albuterol  (PROVENTIL  HFA) 108 (90 Base) MCG/ACT inhaler Inhale 2 puffs into the lungs every 6 (six) hours as needed for wheezing or shortness of breath. 8 g 3   amLODipine  (NORVASC ) 10 MG tablet TAKE 1 TABLET(10 MG) BY MOUTH AT BEDTIME 90 tablet 3   atorvastatin  (LIPITOR) 40 MG tablet Take 1 tablet by mouth at bedtime.     B Complex-C-Folic Acid  (DIALYVITE TABLET) TABS Take 1 tablet by mouth daily.     busPIRone  (BUSPAR ) 15 MG tablet Take 1 tablet (15 mg total) by mouth at bedtime. 90 tablet 2   carvedilol  (COREG ) 6.25 MG tablet Take 1 tablet (6.25 mg total) by mouth 2 (two) times daily with a meal. 180 tablet 6   cinacalcet  (SENSIPAR ) 30 MG  tablet Take 30 mg by mouth every evening.     Continuous Glucose Sensor (DEXCOM G7 SENSOR) MISC Inject 1 Application into the skin. Every 10 days     Darbepoetin Alfa  (ARANESP , ALBUMIN  FREE,) 100 MCG/0.5ML SOSY injection Inject 0.5 mLs (100 mcg total) into the skin every Friday at 6 PM.Strength: 100 MCG/0.5ML 4 mL 2   diltiazem  (CARDIZEM ) 60 MG tablet TAKE 1 TABLET(60 MG) BY MOUTH EVERY 6 HOURS 120 tablet 2   escitalopram  (LEXAPRO ) 20 MG tablet Take 1 tablet (20 mg total) by mouth daily. 90 tablet 1   furosemide  (LASIX ) 80 MG tablet Take 1 tablet by mouth daily.     hydrALAZINE  (APRESOLINE ) 100 MG tablet TAKE 1 TABLET(100 MG) BY MOUTH THREE TIMES DAILY 270 tablet 1   insulin  aspart (NOVOLOG  FLEXPEN) 100 UNIT/ML FlexPen Inject 2-3 Units into the skin 3 (three) times daily with meals. Per sliding scale 15 mL 6   Insulin  Pen Needle (PEN NEEDLES) 31G X 8 MM MISC UAD 100 each 6   levETIRAcetam  (KEPPRA ) 500 MG tablet Take 1 tablet (500 mg total) by mouth 2 (two) times daily. With 250mg (1/2 tab) supplement after dialysis. 198 tablet 4   levothyroxine  (SYNTHROID ) 200 MCG tablet TAKE 1 TABLET(200 MCG) BY MOUTH DAILY BEFORE BREAKFAST. STOP  THE 175 MCG DOSE 90 tablet 1   LOKELMA  5 g packet Take 1 packet by mouth daily.     metoCLOPramide  (REGLAN ) 5 MG tablet Take 1 tablet (5 mg total) by mouth 2 (two) times daily with a meal. 180 tablet 1   ondansetron  (ZOFRAN -ODT) 8 MG disintegrating tablet Take 1 tablet (8 mg total) by mouth every 8 (eight) hours as needed for nausea or vomiting. 20 tablet 6   pantoprazole  (PROTONIX ) 40 MG tablet TAKE 1 TABLET(40 MG) BY MOUTH DAILY 90 tablet 1   RENVELA  800 MG tablet Take 1,600 mg by mouth 3 (three) times daily.     Current Facility-Administered Medications on File Prior to Visit  Medication Dose Route Frequency Provider Last Rate Last Admin   adenosine  (diagnostic) (ADENOSCAN ) infusion 32.4 mg  0.56 mg/kg Intravenous Once Jeffrie Oneil BROCKS, MD        Allergies  Allergen  Reactions   Iron Other (See Comments)    intolerance   Zestril  [Lisinopril ] Other (See Comments)    Hyperkalemia    Pork-Derived Products Other (See Comments)    Patient does not eat pork    Bactrim [Sulfamethoxazole-Trimethoprim] Hives and Itching   Feraheme [Ferumoxytol] Itching    Social History   Socioeconomic History   Marital status: Divorced    Spouse name: Not on file   Number of children: 0   Years of education: 2 years of college    Highest education level: Not on file  Occupational History   Occupation: unemployed    Comment: worked at a group   Occupation: disability  Tobacco Use   Smoking status: Former    Current packs/day: 0.00    Average packs/day: 0.3 packs/day for 2.0 years (0.5 ttl pk-yrs)    Types: Cigarettes    Start date: 03/05/2011    Quit date: 03/04/2013    Years since quitting: 11.4   Smokeless tobacco: Never  Vaping Use   Vaping status: Never Used  Substance and Sexual Activity   Alcohol use: No    Alcohol/week: 0.0 standard drinks of alcohol   Drug use: Not Currently    Frequency: 4.0 times per week    Types: Marijuana   Sexual activity: Yes    Partners: Female    Birth control/protection: None    Comment: women preference   Other Topics Concern   Not on file  Social History Narrative   Occupation: currently unemployed   Single   Homosexual,     Used to be a gang member, got arrested for robbing a gas station (March - June 2012), is cleared now and lives away from her previous friends.          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        Update 04/09/2020   Has own apartment   Caffeine : maybe 2 cans of soda/day    Right handed      Update 03/21/2023   Lives with mother   Caffeine : once in awhile    Social Drivers of Health   Financial Resource Strain: Medium Risk  (01/20/2022)   Overall Financial Resource Strain (CARDIA)    Difficulty of Paying Living Expenses: Somewhat hard  Food Insecurity: No Food Insecurity (07/25/2024)   Hunger Vital Sign    Worried About Running Out of Food in the Last  Year: Never true    Ran Out of Food in the Last Year: Never true  Transportation Needs: No Transportation Needs (07/25/2024)   PRAPARE - Administrator, Civil Service (Medical): No    Lack of Transportation (Non-Medical): No  Physical Activity: Inactive (03/05/2024)   Exercise Vital Sign    Days of Exercise per Week: 0 days    Minutes of Exercise per Session: 0 min  Stress: Stress Concern Present (03/05/2024)   [employer] of Occupational Health - Occupational Stress Questionnaire    Feeling of Stress : Rather much  Social Connections: Socially Isolated (03/05/2024)   Social Connection and Isolation Panel    Frequency of Communication with Friends and Family: More than three times a week    Frequency of Social Gatherings with Friends and Family: Once a week    Attends Religious Services: Never    Database administrator or Organizations: No    Attends Banker Meetings: Never    Marital Status: Divorced  Catering manager Violence: Patient Unable To Answer (07/25/2024)   Humiliation, Afraid, Rape, and Kick questionnaire    Fear of Current or Ex-Partner: Patient unable to answer    Emotionally Abused: Patient unable to answer    Physically Abused: Patient unable to answer    Sexually Abused: Patient unable to answer    Family History  Problem Relation Age of Onset   Multiple sclerosis Mother    Hypothyroidism Mother    Stroke Mother        at age 8 yo   Migraines Mother    Hyperlipidemia Maternal Grandmother    Hypertension Maternal Grandmother    Heart disease Maternal Grandmother        unknown type   Diabetes Maternal Grandmother    Hypertension Maternal Grandfather    Prostate cancer Maternal Grandfather    Diabetes type  I Maternal Grandfather    Breast cancer Paternal Grandmother    Cancer Neg Hx    Colon cancer Neg Hx    Esophageal cancer Neg Hx    Rectal cancer Neg Hx    Stomach cancer Neg Hx    Seizures Neg Hx     Past Surgical History:  Procedure Laterality Date   ESOPHAGOGASTRODUODENOSCOPY (EGD) WITH PROPOFOL  N/A 04/04/2024   Procedure: ESOPHAGOGASTRODUODENOSCOPY (EGD) WITH PROPOFOL ;  Surgeon: Charlanne Groom, MD;  Location: [facility];  Service: Gastroenterology;  Laterality: N/A;   FOOT FUSION Right 2006   put screws in it too (09/19/2013)    ROS: Review of Systems Negative except as stated above  PHYSICAL EXAM: BP 138/77 (BP Location: Left Arm, Patient Position: Sitting, Cuff Size: Normal)   Pulse 72   Temp 97.9 F (36.6 C) (Oral)   Ht 5' 4 (1.626 m)   Wt 128 lb (58.1 kg)   SpO2 97%   BMI 21.97 kg/m   Physical Exam Pox maintained at 96-98% with ambulation on 2 Lt O2 General appearance - Young AAF who appears chronically ill, and in no distress. She is using her portable O2. Mental status - normal mood, behavior, speech, dress, motor activity, and thought processes Chest - clear to auscultation, no wheezes, rales or rhonchi, symmetric air entry Heart - normal rate, regular rhythm, normal S1, S2, no murmurs, rubs, clicks or gallops Extremities - peripheral pulses normal, no pedal edema, no clubbing or cyanosis      Latest Ref Rng & Units 07/20/2024    7:32 AM 07/19/2024    2:54 AM 07/18/2024  4:46 PM  CMP  Glucose 70 - 99 mg/dL 840  847  769   BUN 6 - 20 mg/dL 24  35  32   Creatinine 0.44 - 1.00 mg/dL 3.62  2.30  2.67   Sodium 135 - 145 mmol/L 133  134  134   Potassium 3.5 - 5.1 mmol/L 5.0  5.1  5.4   Chloride 98 - 111 mmol/L 90  91  92   CO2 22 - 32 mmol/L 27  27  27    Calcium  8.9 - 10.3 mg/dL 89.2  9.7  89.5   Total Protein 6.5 - 8.1 g/dL   7.2   Total Bilirubin 0.0 - 1.2 mg/dL   0.8   Alkaline Phos 38 - 126 U/L   112   AST 15 - 41 U/L   21   ALT 0 - 44 U/L   17     Lipid Panel     Component Value Date/Time   CHOL 247 (H) 01/28/2023 0300   CHOL 239 (H) 12/28/2020 1432   TRIG 88 01/31/2023 0548   HDL 119 01/28/2023 0300   HDL 72 12/28/2020 1432   CHOLHDL 2.1 01/28/2023 0300   VLDL 13 01/28/2023 0300   LDLCALC 115 (H) 01/28/2023 0300   LDLCALC 151 (H) 12/28/2020 1432    CBC    Component Value Date/Time   WBC 8.3 07/19/2024 0254   RBC 4.01 07/19/2024 0254   HGB 10.8 (L) 07/19/2024 0254   HGB 9.4 (L) 03/30/2023 1439   HCT 36.7 07/19/2024 0254   HCT 30.0 (L) 03/30/2023 1439   PLT 181 07/19/2024 0254   PLT 174 03/30/2023 1439   MCV 91.5 07/19/2024 0254   MCV 89 03/30/2023 1439   MCH 26.9 07/19/2024 0254   MCHC 29.4 (L) 07/19/2024 0254   RDW 19.9 (H) 07/19/2024 0254   RDW 14.7 03/30/2023 1439   LYMPHSABS 1.4 07/18/2024 1646   LYMPHSABS 1.3 03/21/2023 1214   MONOABS 0.7 07/18/2024 1646   EOSABS 0.4 07/18/2024 1646   EOSABS 0.8 (H) 03/21/2023 1214   BASOSABS 0.1 07/18/2024 1646   BASOSABS 0.1 03/21/2023 1214    ASSESSMENT AND PLAN: 1. Hospital discharge follow-up (Primary)   2. Subclavian vein stenosis, right May have been caused by previous temporary HD catheters. Pt asymptomatic and good collaterals have formed. Will have her f/u with Vein/Vascular but I doubt anything needs to be done at this time.  3. Hypertensive kidney disease with ESRD on dialysis (HCC) BP today close to goal. Continue meds listed above.  4. Chronic respiratory failure with hypoxia, on home oxygen therapy (HCC) Likely related to volume status. Continue O2 2Lt.  - Ambulatory referral to Pulmonology  5. Vaginal yeast infection - fluconazole  (DIFLUCAN ) 150 MG tablet; Take 1 tablet (150 mg total) by mouth daily.  Dispense: 1 tablet; Refill: 0  6. 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, with long-term current use of insulin  (HCC) On insulin  pump and followed by endo. Encourage healthy eating For the neuropathy, will increase Gabapentin  to 100  mg a.m/100mg  noon/200mg  p.m - Ambulatory referral to Ophthalmology - Lipid panel  7. Major depressive disorder, recurrent episode, mild (HCC) Continue Lexapro . Encourage to pursue counseling as discussed on previous visits  8. Itchy skin Advised to asked nephrologist at HD site for recommendations on best med to use for this  9. ESRD on dialysis (HCC) Pt to continue HD 3x/wk   Patient was given the opportunity to ask questions.  Patient verbalized understanding of  the plan and was able to repeat key elements of the plan.   This documentation was completed using Paediatric nurse.  Any transcriptional errors are unintentional.  Orders Placed This Encounter  Procedures   Lipid panel   Ambulatory referral to Pulmonology   Ambulatory referral to Ophthalmology     Requested Prescriptions   Signed Prescriptions Disp Refills   fluconazole  (DIFLUCAN ) 150 MG tablet 1 tablet 0    Sig: Take 1 tablet (150 mg total) by mouth daily.   gabapentin  (NEURONTIN ) 100 MG capsule 120 capsule 6    Sig: 1 tab Po Q am and noon and 2 tabs PO Q p.m    Return in about 4 months (around 12/15/2024).  Barnie Louder, MD, FACP

## 2024-08-18 ENCOUNTER — Encounter: Payer: Self-pay | Admitting: Internal Medicine

## 2024-08-18 MED ORDER — GABAPENTIN 100 MG PO CAPS: ORAL_CAPSULE | ORAL | 6 refills | Status: DC

## 2024-08-20 ENCOUNTER — Other Ambulatory Visit: Payer: Self-pay | Admitting: Internal Medicine

## 2024-08-20 DIAGNOSIS — R4589 Other symptoms and signs involving emotional state: Secondary | ICD-10-CM

## 2024-08-20 DIAGNOSIS — F331 Major depressive disorder, recurrent, moderate: Secondary | ICD-10-CM

## 2024-08-20 MED ORDER — BUSPIRONE HCL 15 MG PO TABS: 15.0000 mg | ORAL_TABLET | Freq: Every day | ORAL | 2 refills | Status: AC

## 2024-08-21 ENCOUNTER — Telehealth: Payer: Self-pay | Admitting: Internal Medicine

## 2024-08-21 NOTE — Telephone Encounter (Addendum)
 Error

## 2024-08-22 ENCOUNTER — Telehealth: Payer: MEDICAID | Admitting: *Deleted

## 2024-08-22 ENCOUNTER — Encounter: Payer: Self-pay | Admitting: *Deleted

## 2024-08-23 ENCOUNTER — Ambulatory Visit: Payer: MEDICAID | Admitting: Internal Medicine

## 2024-08-23 ENCOUNTER — Telehealth: Payer: Self-pay | Admitting: *Deleted

## 2024-08-23 DIAGNOSIS — J9601 Acute respiratory failure with hypoxia: Secondary | ICD-10-CM

## 2024-08-23 NOTE — Patient Instructions (Signed)
 Visit Information  Thank you for taking time to visit with me today. Please don't hesitate to contact me if I can be of assistance to you before our next scheduled telephone appointment.   Following is a copy of your care plan:   Goals Addressed             This Visit's Progress    COMPLETED: [facility] Transitions of Care (TOC) Care Plan       Problems:  Recent Hospitalization for treatment of Acute respiratory failure Patient received oxygen on 07/26/24  Goal: Met Over the next 30 days, the patient will not experience hospital readmission  Interventions:  Transitions of Care: Durable Medical Equipment (DME) reviewed with patient/caregiver Medication review Reviewed upcoming appointments including: PT evaluation on 09/03/24 and Pulmonology on 09/26/24  Discussed patient has oxygen and continues to use 2L continuously Discussed referral to Longitudinal CM-patient consents Referral placed to CCM  Patient Self Care Activities:  Attend all scheduled provider appointments Call provider office for new concerns or questions  Notify RN Care Manager of [facility] call rescheduling needs Participate in Transition of Care Program/Attend TOC scheduled calls Take medications as prescribed   Breathing exercises 3-5 times a day  Plan:  Telephone follow up appointment with care management team member scheduled for:  08/22/24 at 12:45 pm        Patient verbalizes understanding of instructions and care plan provided today and agrees to view in MyChart. Active MyChart status and patient understanding of how to access instructions and care plan via MyChart confirmed with patient.     The care management team will reach out to the patient again over the next 30 days.   Please call the care guide team at [phone] if you need to cancel or reschedule your appointment.   Please call [phone]-TALK (toll free, 24 hour hotline) go to [facility] [address],  [place] [phone]) call 911 if you are experiencing a Mental Health or Behavioral Health Crisis or need someone to talk to.  Andrea Dimes RN, BSN [place]  [facility] RN Care Manager [phone]
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
## 2024-09-03 ENCOUNTER — Other Ambulatory Visit: Payer: Self-pay | Admitting: Internal Medicine

## 2024-09-03 ENCOUNTER — Ambulatory Visit: Payer: MEDICAID | Attending: Family Medicine | Admitting: Physical Therapy

## 2024-09-03 ENCOUNTER — Encounter: Payer: Self-pay | Admitting: Physical Therapy

## 2024-09-03 VITALS — BP 120/72 | HR 73

## 2024-09-03 DIAGNOSIS — R2681 Unsteadiness on feet: Secondary | ICD-10-CM | POA: Diagnosis present

## 2024-09-03 DIAGNOSIS — M6281 Muscle weakness (generalized): Secondary | ICD-10-CM | POA: Insufficient documentation

## 2024-09-03 DIAGNOSIS — G629 Polyneuropathy, unspecified: Secondary | ICD-10-CM | POA: Insufficient documentation

## 2024-09-03 DIAGNOSIS — M79604 Pain in right leg: Secondary | ICD-10-CM | POA: Diagnosis present

## 2024-09-03 DIAGNOSIS — R2689 Other abnormalities of gait and mobility: Secondary | ICD-10-CM | POA: Insufficient documentation

## 2024-09-03 DIAGNOSIS — E1029 Type 1 diabetes mellitus with other diabetic kidney complication: Secondary | ICD-10-CM
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
## 2024-09-06 ENCOUNTER — Telehealth: Payer: MEDICAID | Admitting: Licensed Clinical Social Worker

## 2024-09-13 ENCOUNTER — Telehealth: Payer: Self-pay | Admitting: *Deleted

## 2024-09-13 NOTE — Progress Notes (Signed)
 Complex Care Management Care Guide Note  09/13/2024 Name: Angela [name] MRN: [id number] DOB: 10/19/1986  Angela [name] is a 38 y.o. year old female who is a primary care patient of Vicci Barnie NOVAK, MD and is actively engaged with the care management team. I reached out to [name] by phone today to assist with re-scheduling  with the BSW.  Follow up plan: Unsuccessful telephone outreach attempt made. A HIPAA compliant phone message was left for the patient providing contact information and requesting a return call.  Harlene Satterfield  [facility]  Value[facility]Based [facility], [facility] Guide  Direct Dial : [phone]  Fax [phone]

## 2024-09-16 NOTE — Progress Notes (Unsigned)
 Complex Care Management Care Guide Note  09/16/2024 Name: Angela [name] MRN: [id number] DOB: 1986/03/10  Angela [name] is a 38 y.o. year old female who is a primary care patient of Vicci Barnie NOVAK, MD and is actively engaged with the care management team. I reached out to [name] by phone today to assist with re-scheduling  with the BSW.  Follow up plan: Unsuccessful telephone outreach attempt made. A HIPAA compliant phone message was left for the patient providing contact information and requesting a return call.  Harlene Satterfield  [facility]  Value-Based Care Institute, [facility] Guide  Direct Dial : [phone]  Fax [phone]

## 2024-09-17 NOTE — Progress Notes (Signed)
 Complex Care Management Care Guide Note  09/17/2024 Name: Zayah Keilman MRN: [id number] DOB: 10/19/1986  Charisa Twitty is a 38 y.o. year old female who is a primary care patient of Vicci Barnie NOVAK, MD and is actively engaged with the care management team. I reached out to [name] by phone today to assist with re-scheduling  with the BSW.  Follow up plan: Unsuccessful telephone outreach attempt made. A HIPAA compliant phone message was left for the patient providing contact information and requesting a return call. No further outreach attempts will be made at this time. We have been unable to contact the patient to reschedule for complex care management services.   Harlene Satterfield  [facility]  Value-Based Care Institute, [place] [facility] Guide  Direct Dial : [phone]  Fax [phone]

## 2024-09-19 ENCOUNTER — Ambulatory Visit: Payer: MEDICAID | Admitting: Physical Therapy

## 2024-09-19 ENCOUNTER — Other Ambulatory Visit: Payer: Self-pay | Admitting: Internal Medicine

## 2024-09-19 VITALS — BP 116/66 | HR 80

## 2024-09-19 DIAGNOSIS — R2689 Other abnormalities of gait and mobility: Secondary | ICD-10-CM

## 2024-09-19 DIAGNOSIS — M6281 Muscle weakness (generalized): Secondary | ICD-10-CM

## 2024-09-19 DIAGNOSIS — R2681 Unsteadiness on feet: Secondary | ICD-10-CM
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
## 2024-09-26 ENCOUNTER — Other Ambulatory Visit: Payer: Self-pay | Admitting: Internal Medicine

## 2024-09-26 ENCOUNTER — Encounter: Payer: Self-pay | Admitting: Internal Medicine

## 2024-09-26 ENCOUNTER — Encounter: Payer: Self-pay | Admitting: Pulmonary Disease

## 2024-09-26 ENCOUNTER — Ambulatory Visit: Payer: MEDICAID | Admitting: Pulmonary Disease

## 2024-09-26 VITALS — BP 133/79 | HR 81 | Temp 97.8°F | Ht 64.0 in | Wt 127.6 lb

## 2024-09-26 DIAGNOSIS — N186 End stage renal disease: Secondary | ICD-10-CM | POA: Diagnosis not present

## 2024-09-26 DIAGNOSIS — J9611 Chronic respiratory failure with hypoxia: Secondary | ICD-10-CM

## 2024-09-26 DIAGNOSIS — R0902 Hypoxemia: Secondary | ICD-10-CM

## 2024-09-26 DIAGNOSIS — Z992 Dependence on renal dialysis: Secondary | ICD-10-CM | POA: Diagnosis not present

## 2024-09-26 DIAGNOSIS — E1022 Type 1 diabetes mellitus with diabetic chronic kidney disease: Secondary | ICD-10-CM | POA: Diagnosis not present

## 2024-09-26 DIAGNOSIS — G4734 Idiopathic sleep related nonobstructive alveolar hypoventilation: Secondary | ICD-10-CM

## 2024-09-26 DIAGNOSIS — Z87891 Personal history of nicotine dependence: Secondary | ICD-10-CM

## 2024-09-26 NOTE — Patient Instructions (Signed)
 It is nice to meet you  I do not fully understand why your oxygen is dropping  I will discuss with Dr. Macel.  There are some things about fistulas that can affect oxygen as well as pulmonary hypertension which can affect oxygen.  We discussed this in some detail today.  Given your oxygen drops more at night, I have ordered a sleep study to evaluate for sleep apnea.  Return to clinic in 3 months or sooner as needed with Dr. Annella
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
## 2024-09-27 ENCOUNTER — Other Ambulatory Visit: Payer: Self-pay | Admitting: Internal Medicine

## 2024-09-27 MED ORDER — INSULIN ASPART 100 UNIT/ML IJ SOLN: INTRAMUSCULAR | 99 refills | Status: AC

## 2024-09-30 ENCOUNTER — Encounter: Payer: Self-pay | Admitting: Pulmonary Disease

## 2024-10-03 ENCOUNTER — Ambulatory Visit: Payer: MEDICAID | Attending: Family Medicine | Admitting: Physical Therapy

## 2024-10-03 VITALS — BP 116/66 | HR 78

## 2024-10-03 DIAGNOSIS — M6281 Muscle weakness (generalized): Secondary | ICD-10-CM

## 2024-10-03 DIAGNOSIS — R2689 Other abnormalities of gait and mobility: Secondary | ICD-10-CM | POA: Diagnosis present

## 2024-10-03 DIAGNOSIS — R2681 Unsteadiness on feet: Secondary | ICD-10-CM | POA: Diagnosis present
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
## 2024-10-10 ENCOUNTER — Ambulatory Visit: Payer: MEDICAID | Admitting: Physical Therapy

## 2024-10-10 VITALS — BP 109/66 | HR 81

## 2024-10-10 DIAGNOSIS — M6281 Muscle weakness (generalized): Secondary | ICD-10-CM | POA: Diagnosis not present

## 2024-10-10 DIAGNOSIS — R2681 Unsteadiness on feet: Secondary | ICD-10-CM

## 2024-10-10 DIAGNOSIS — R2689 Other abnormalities of gait and mobility: Secondary | ICD-10-CM
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
## 2024-10-16 ENCOUNTER — Other Ambulatory Visit: Payer: Self-pay | Admitting: Internal Medicine

## 2024-10-17 ENCOUNTER — Ambulatory Visit: Payer: MEDICAID | Admitting: Physical Therapy

## 2024-10-17 DIAGNOSIS — R2689 Other abnormalities of gait and mobility: Secondary | ICD-10-CM

## 2024-10-17 DIAGNOSIS — M6281 Muscle weakness (generalized): Secondary | ICD-10-CM | POA: Diagnosis not present

## 2024-10-17 DIAGNOSIS — R2681 Unsteadiness on feet: Secondary | ICD-10-CM
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
## 2024-10-24 ENCOUNTER — Telehealth: Payer: Self-pay | Admitting: Physical Therapy

## 2024-10-24 ENCOUNTER — Ambulatory Visit: Payer: MEDICAID | Attending: Family Medicine | Admitting: Physical Therapy

## 2024-10-24 DIAGNOSIS — R2689 Other abnormalities of gait and mobility: Secondary | ICD-10-CM | POA: Insufficient documentation

## 2024-10-24 DIAGNOSIS — R2681 Unsteadiness on feet: Secondary | ICD-10-CM | POA: Insufficient documentation

## 2024-10-24 DIAGNOSIS — M6281 Muscle weakness (generalized): Secondary | ICD-10-CM | POA: Insufficient documentation
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
## 2024-10-29 ENCOUNTER — Other Ambulatory Visit: Payer: Self-pay | Admitting: Internal Medicine

## 2024-10-31 ENCOUNTER — Encounter: Payer: Self-pay | Admitting: Internal Medicine

## 2024-10-31 ENCOUNTER — Other Ambulatory Visit: Payer: Self-pay | Admitting: Internal Medicine

## 2024-10-31 ENCOUNTER — Ambulatory Visit: Payer: MEDICAID | Admitting: Physical Therapy

## 2024-10-31 VITALS — BP 117/70 | HR 71

## 2024-10-31 DIAGNOSIS — R2681 Unsteadiness on feet: Secondary | ICD-10-CM

## 2024-10-31 DIAGNOSIS — M6281 Muscle weakness (generalized): Secondary | ICD-10-CM

## 2024-10-31 DIAGNOSIS — R2689 Other abnormalities of gait and mobility: Secondary | ICD-10-CM
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
## 2024-11-01 ENCOUNTER — Other Ambulatory Visit: Payer: Self-pay | Admitting: Internal Medicine

## 2024-11-02 ENCOUNTER — Other Ambulatory Visit: Payer: Self-pay | Admitting: Internal Medicine

## 2024-11-09 ENCOUNTER — Other Ambulatory Visit: Payer: Self-pay | Admitting: Internal Medicine

## 2024-11-09 DIAGNOSIS — F331 Major depressive disorder, recurrent, moderate: Secondary | ICD-10-CM

## 2024-11-11 ENCOUNTER — Other Ambulatory Visit: Payer: Self-pay | Admitting: *Deleted

## 2024-11-11 NOTE — Telephone Encounter (Signed)
 Last seen on 08/08/24 Follow up scheduled on 07/22/25  Dr.Ahern patient she was prescribing ondansetron  ODT 8 mg tablets. Are you willing to take over Rx?   Rx is pending for approval or denial.
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
## 2024-11-26 ENCOUNTER — Ambulatory Visit (HOSPITAL_BASED_OUTPATIENT_CLINIC_OR_DEPARTMENT_OTHER): Payer: MEDICAID | Attending: Pulmonary Disease | Admitting: Pulmonary Disease

## 2024-11-26 ENCOUNTER — Other Ambulatory Visit: Payer: Self-pay

## 2024-11-26 ENCOUNTER — Encounter (HOSPITAL_BASED_OUTPATIENT_CLINIC_OR_DEPARTMENT_OTHER): Payer: Self-pay

## 2024-11-26 ENCOUNTER — Ambulatory Visit: Payer: MEDICAID | Attending: Vascular Surgery | Admitting: Vascular Surgery

## 2024-11-26 ENCOUNTER — Encounter: Payer: Self-pay | Admitting: Vascular Surgery

## 2024-11-26 VITALS — BP 132/73 | HR 79 | Temp 97.7°F | Ht 64.0 in | Wt 134.0 lb

## 2024-11-26 DIAGNOSIS — T829XXA Unspecified complication of cardiac and vascular prosthetic device, implant and graft, initial encounter: Secondary | ICD-10-CM

## 2024-11-26 DIAGNOSIS — N186 End stage renal disease: Secondary | ICD-10-CM

## 2024-12-08 ENCOUNTER — Encounter: Payer: Self-pay | Admitting: Internal Medicine

## 2024-12-09 ENCOUNTER — Other Ambulatory Visit: Payer: Self-pay | Admitting: Family Medicine

## 2024-12-09 MED ORDER — ONDANSETRON 8 MG PO TBDP: 8.0000 mg | ORAL_TABLET | Freq: Three times a day (TID) | ORAL | 1 refills | Status: AC | PRN

## 2024-12-09 NOTE — Telephone Encounter (Signed)
 Hey Dr.Newlin,   A 90 days refill for Escitalopram  was sent 11/10/24 that she may not be aware of but still needs a refill of Odansetron. Thanks!

## 2024-12-18 ENCOUNTER — Other Ambulatory Visit: Payer: Self-pay | Admitting: Internal Medicine

## 2024-12-19 ENCOUNTER — Encounter: Payer: Self-pay | Admitting: Internal Medicine

## 2024-12-20 ENCOUNTER — Other Ambulatory Visit: Payer: Self-pay | Admitting: Internal Medicine

## 2024-12-20 DIAGNOSIS — F331 Major depressive disorder, recurrent, moderate: Secondary | ICD-10-CM

## 2024-12-23 ENCOUNTER — Encounter (HOSPITAL_COMMUNITY): Payer: Self-pay | Admitting: Vascular Surgery

## 2024-12-23 ENCOUNTER — Other Ambulatory Visit: Payer: Self-pay

## 2024-12-23 NOTE — Progress Notes (Signed)
 SDW call  Patient was given pre-op instructions over the phone. Patient verbalized understanding of instructions provided.  She denies any SOB, fever or cough, however, patient does wear home oxygen 2L/[place]   PCP - Dr. Collier Louder Cardiologist - Dr. Ronal Ross Nephrology: Belvie Och, Dialysis at [facility] M,W,F Pulmonary:    PPM/ICD - denies Device Orders - na Rep Notified - na   Chest x-ray - 07/18/2024 EKG -  07/19/2024 Stress Test - 12/23/2021 ECHO - 01/28/2023 Cardiac Cath -   Sleep Study/sleep apnea/CPAP: denies, but does wear home oxygen 2L/[place]  Type one diabetic. A1C 9.4 on 10/27/2025L Dexcom 7 tor right arm Fasting Blood sugar range: 175-180 How often check sugars: continuous   Blood Thinner Instructions: denies Aspirin  Instructions:denies   ERAS Protcol - NPO   Anesthesia review: Yes.  ESRD w dialysis, stroke, HTN, DM type 1, seizures  Your procedure is scheduled on Thursday December 26, 2024  Report to [facility] Main Entrance A at  0730  A.M., then check in with the Admitting office.  Call this number if you have problems the morning of surgery:  [phone]   If you have any questions prior to your surgery date call [phone]: Open Monday-Friday 8am-4pm If you experience any cold or flu symptoms such as cough, fever, chills, shortness of breath, etc. between now and your scheduled surgery, please notify us  at the above number    Remember:  Do not eat or drink after midnight the night before your surgery  Take these medicines the morning of surgery with A SIP OF WATER :  Carvedilol , diltiazem , lexapro , gabapentin , hydralazine , synthroid , lokelma , reglan   As needed: Albuterol , zofran   As of today, STOP taking any Aspirin  (unless otherwise instructed by your surgeon) Aleve , Naproxen , Ibuprofen , Motrin , Advil , Goody's, BC's, all herbal medications, fish oil, and all vitamins.

## 2024-12-24 ENCOUNTER — Other Ambulatory Visit: Payer: Self-pay | Admitting: Internal Medicine

## 2024-12-24 NOTE — Progress Notes (Addendum)
 Anesthesia Chart Review: Same day workup  39 year old female with pertinent history including type 1 diabetes on insulin  pump (A1c 9.4 on 10/14/2024), hypothyroidism, GERD on PPI, concern for OSA (sleep study pending), seizure disorder (on Keppra ), CVA, ESRD on HD MWF via LUE brachiobasilic AVF.  Patient also reports she uses 2 L supplemental O2 as needed.  Echo 01/2023 (during admission for left BG ICH with IVH s/p EVD secondary to uncontrolled hypertension) showed LVEF 60 to 65%, severe LVH, normal RV systolic function, moderate pericardial effusion with no evidence of tamponade, no significant valve abnormalities.  PYP study 02/2022 was not consistent with amyloid.  Nuclear stress test 12/2021 was low risk.  She will need day of surgery labs and evaluation.  EKG 07/18/24: NSR.  Rate 74.  Right superior axis deviation.  Anteroseptal infarct, age undetermined.  CTA chest 07/18/2024: IMPRESSION: No evidence of pulmonary emboli.   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   No other focal abnormality is noted.  TTE 01/28/2023: 1. There is a moderate pericardial effusion anterior to the right  ventricle and a small pericardial effusion posterior to the left  ventricle. There is no evidence of cardiac tamponade.   2. Left ventricular ejection fraction, by estimation, is 60 to 65%. The  left ventricle has normal function. The left ventricle has no regional  wall motion abnormalities. There is severe left ventricular hypertrophy.  Left ventricular diastolic parameters   are indeterminate.   3. Right ventricular systolic function is normal. The right ventricular  size is mildly enlarged. There is normal pulmonary artery systolic  pressure. Dilated pulmonary artery.   4. Left atrial size was moderately dilated.   5. The mitral valve is normal in structure. Trivial mitral valve  regurgitation. No evidence of mitral stenosis.   6. The aortic  valve is tricuspid. Aortic valve regurgitation is not  visualized. No aortic stenosis is present.   7. The inferior vena cava is normal in size with <50% respiratory  variability, suggesting right atrial pressure of 8 mmHg.   Comparison(s): Changes from prior study are noted. Moderate pericardial  effusion is new with no evidence of tamponade. There is severe left  ventricular hypertrophy, echogenic. Consider PYP scan to r/o amyloidosis.   Nuclear stress test 12/23/2021:   The study is normal. The study is low risk.   No ST deviation was noted.   LV perfusion is normal. There is no evidence of ischemia. There is no evidence of infarction.   Left ventricular function is normal. Nuclear stress EF: 63 %. The left ventricular ejection fraction is normal (55-65%). End diastolic cavity size is normal. End systolic cavity size is normal.   Prior study not available for comparison.   Lynwood Geofm RIGGERS [facility]Anesthesiology Phone [phone] 12/24/2024 3:22 PM

## 2024-12-24 NOTE — Anesthesia Preprocedure Evaluation (Addendum)
 "                                  Anesthesia Evaluation  Patient identified by MRN, date of birth, ID band Patient awake    Reviewed: Allergy & Precautions, H&P , NPO status , Patient's Chart, lab work & pertinent test results, reviewed documented beta blocker date and time   Airway Mallampati: II  TM Distance: >3 FB Neck ROM: Full    Dental no notable dental hx. (+) Teeth Intact, Dental Advisory Given   Pulmonary asthma , former smoker   Pulmonary exam normal breath sounds clear to auscultation       Cardiovascular hypertension, Pt. on medications Normal cardiovascular exam Rhythm:Regular Rate:Normal  Echo 01/28/23 1. There is a moderate pericardial effusion anterior to the right  ventricle and a small pericardial effusion posterior to the left  ventricle. There is no evidence of cardiac tamponade.   2. Left ventricular ejection fraction, by estimation, is 60 to 65%. The  left ventricle has normal function. The left ventricle has no regional  wall motion abnormalities. There is severe left ventricular hypertrophy.  Left ventricular diastolic parameters   are indeterminate.   3. Right ventricular systolic function is normal. The right ventricular  size is mildly enlarged. There is normal pulmonary artery systolic  pressure. Dilated pulmonary artery.   4. Left atrial size was moderately dilated.   5. The mitral valve is normal in structure. Trivial mitral valve  regurgitation. No evidence of mitral stenosis.   6. The aortic valve is tricuspid. Aortic valve regurgitation is not  visualized. No aortic stenosis is present.   7. The inferior vena cava is normal in size with <50% respiratory  variability, suggesting right atrial pressure of 8 mmHg.   EKG 07/19/24 NSR, RAD, anterior infarct   Neuro/Psych  Headaches, Seizures -,  PSYCHIATRIC DISORDERS Anxiety Depression    Diabetic peripheral neuropathy Diabetic retinopathy  Neuromuscular disease CVA, Residual  Symptoms    GI/Hepatic ,GERD  Medicated,,(+) Cirrhosis       , Hepatitis -, UnspecifiedHx/o NASH  Lab Results      Component                Value               Date                      ALT                      17                  07/18/2024                AST                      21                  07/18/2024                ALKPHOS                  112                 07/18/2024                BILITOT  0.8                 07/18/2024             Hx/o gastroparesis   Endo/Other  diabetes, Poorly Controlled, Type 1, Insulin  DependentHypothyroidism  HLD  Renal/GU ESRFRenal diseaseLast dialysis  Lab Results      Component                Value               Date                      NA                       133 (L)             07/20/2024                CL                       90 (L)              07/20/2024                K                        5.0                 07/20/2024                CO2                      27                  07/20/2024                BUN                      24 (H)              07/20/2024                CREATININE               6.37 (H)            07/20/2024                GFRNONAA                 8 (L)               07/20/2024                CALCIUM                   10.7 (H)            07/20/2024                PHOS                     7.2 (H)             07/20/2024                ALBUMIN                   3.7  07/20/2024                GLUCOSE                  159 (H)             07/20/2024             negative genitourinary   Musculoskeletal negative musculoskeletal ROS (+)    Abdominal   Peds negative pediatric ROS (+)  Hematology  (+) Blood dyscrasia, anemia Lab Results      Component                Value               Date                      WBC                      8.3                 07/19/2024                HGB                      10.8 (L)            07/19/2024                HCT                       36.7                07/19/2024                MCV                      91.5                07/19/2024                PLT                      181                 07/19/2024              Anesthesia Other Findings   Reproductive/Obstetrics negative OB ROS                              Anesthesia Physical Anesthesia Plan  ASA: 4  Anesthesia Plan: MAC and Regional   Post-op Pain Management: Minimal or no pain anticipated and Ofirmev  IV (intra-op)*   Induction: Intravenous  PONV Risk Score and Plan: 3 and Propofol  infusion and Treatment may vary due to age or medical condition  Airway Management Planned: Natural Airway and Simple Face Mask  Additional Equipment: None  Intra-op Plan:   Post-operative Plan:   Informed Consent: I have reviewed the patients History and Physical, chart, labs and discussed the procedure including the risks, benefits and alternatives for the proposed anesthesia with the patient or authorized representative who has indicated his/her understanding and acceptance.     Dental advisory given  Plan Discussed with: CRNA and Anesthesiologist  Anesthesia Plan Comments: (PAT note by Lynwood Hope, PA-C:  39 year old female with pertinent history including type 1  diabetes on insulin  pump (A1c 9.4 on 10/14/2024), hypothyroidism, GERD on PPI, concern for OSA (sleep study pending), seizure disorder (on Keppra ), CVA, ESRD on HD MWF via LUE brachiobasilic AVF.  Patient also reports she uses 2 L supplemental O2 as needed.  Echo 01/2023 (during admission for left BG ICH with IVH s/p EVD secondary to uncontrolled hypertension) showed LVEF 60 to 65%, severe LVH, normal RV systolic function, moderate pericardial effusion with no evidence of tamponade, no significant valve abnormalities.  PYP study 02/2022 was not consistent with amyloid.  Nuclear stress test 12/2021 was low risk.  She will need day of surgery labs and evaluation.  EKG 07/18/24: NSR.   Rate 74.  Right superior axis deviation.  Anteroseptal infarct, age undetermined.  CTA chest 07/18/2024: IMPRESSION: No evidence of pulmonary emboli.   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   No other focal abnormality is noted.  TTE 01/28/2023: 1. There is a moderate pericardial effusion anterior to the right  ventricle and a small pericardial effusion posterior to the left  ventricle. There is no evidence of cardiac tamponade.   2. Left ventricular ejection fraction, by estimation, is 60 to 65%. The  left ventricle has normal function. The left ventricle has no regional  wall motion abnormalities. There is severe left ventricular hypertrophy.  Left ventricular diastolic parameters   are indeterminate.   3. Right ventricular systolic function is normal. The right ventricular  size is mildly enlarged. There is normal pulmonary artery systolic  pressure. Dilated pulmonary artery.   4. Left atrial size was moderately dilated.   5. The mitral valve is normal in structure. Trivial mitral valve  regurgitation. No evidence of mitral stenosis.   6. The aortic valve is tricuspid. Aortic valve regurgitation is not  visualized. No aortic stenosis is present.   7. The inferior vena cava is normal in size with <50% respiratory  variability, suggesting right atrial pressure of 8 mmHg.   Comparison(s): Changes from prior study are noted. Moderate pericardial  effusion is new with no evidence of tamponade. There is severe left  ventricular hypertrophy, echogenic. Consider PYP scan to r/o amyloidosis.   Nuclear stress test 12/23/2021:   The study is normal. The study is low risk.   No ST deviation was noted.   LV perfusion is normal. There is no evidence of ischemia. There is no evidence of infarction.   Left ventricular function is normal. Nuclear stress EF: 63 %. The left ventricular ejection fraction is normal (55-65%). End  diastolic cavity size is normal. End systolic cavity size is normal.   Prior study not available for comparison.    )         Anesthesia Quick Evaluation  "

## 2024-12-25 ENCOUNTER — Encounter (HOSPITAL_COMMUNITY): Payer: Self-pay | Admitting: Vascular Surgery

## 2024-12-26 ENCOUNTER — Ambulatory Visit (HOSPITAL_COMMUNITY)
Admission: RE | Admit: 2024-12-26 | Discharge: 2024-12-26 | Disposition: A | Discharge status: Home / Self Care | Payer: MEDICAID | Attending: Vascular Surgery | Admitting: Vascular Surgery

## 2024-12-26 ENCOUNTER — Ambulatory Visit (HOSPITAL_COMMUNITY): Payer: MEDICAID | Admitting: Physician Assistant

## 2024-12-26 ENCOUNTER — Encounter (HOSPITAL_COMMUNITY)
Admission: RE | Disposition: A | Discharge status: Home / Self Care | Payer: Self-pay | Source: Home / Self Care | Attending: Vascular Surgery

## 2024-12-26 ENCOUNTER — Other Ambulatory Visit: Payer: Self-pay

## 2024-12-26 ENCOUNTER — Encounter (HOSPITAL_COMMUNITY): Payer: Self-pay | Admitting: Vascular Surgery

## 2024-12-26 DIAGNOSIS — N186 End stage renal disease: Secondary | ICD-10-CM

## 2024-12-26 DIAGNOSIS — I120 Hypertensive chronic kidney disease with stage 5 chronic kidney disease or end stage renal disease: Secondary | ICD-10-CM | POA: Diagnosis not present

## 2024-12-26 DIAGNOSIS — E1022 Type 1 diabetes mellitus with diabetic chronic kidney disease: Secondary | ICD-10-CM

## 2024-12-26 DIAGNOSIS — E039 Hypothyroidism, unspecified: Secondary | ICD-10-CM | POA: Diagnosis not present

## 2024-12-26 DIAGNOSIS — Z992 Dependence on renal dialysis: Secondary | ICD-10-CM

## 2024-12-26 DIAGNOSIS — K219 Gastro-esophageal reflux disease without esophagitis: Secondary | ICD-10-CM | POA: Insufficient documentation

## 2024-12-26 DIAGNOSIS — K7460 Unspecified cirrhosis of liver: Secondary | ICD-10-CM | POA: Diagnosis not present

## 2024-12-26 DIAGNOSIS — E1065 Type 1 diabetes mellitus with hyperglycemia: Secondary | ICD-10-CM | POA: Insufficient documentation

## 2024-12-26 DIAGNOSIS — K3184 Gastroparesis: Secondary | ICD-10-CM | POA: Insufficient documentation

## 2024-12-26 DIAGNOSIS — J45909 Unspecified asthma, uncomplicated: Secondary | ICD-10-CM | POA: Diagnosis not present

## 2024-12-26 DIAGNOSIS — L98A129 Non-pressure chronic ulcer of left upper arm with unspecified severity: Secondary | ICD-10-CM

## 2024-12-26 DIAGNOSIS — I6930 Unspecified sequelae of cerebral infarction: Secondary | ICD-10-CM | POA: Insufficient documentation

## 2024-12-26 DIAGNOSIS — E1043 Type 1 diabetes mellitus with diabetic autonomic (poly)neuropathy: Secondary | ICD-10-CM | POA: Insufficient documentation

## 2024-12-26 DIAGNOSIS — T82898A Other specified complication of vascular prosthetic devices, implants and grafts, initial encounter: Secondary | ICD-10-CM

## 2024-12-26 DIAGNOSIS — I1311 Hypertensive heart and chronic kidney disease without heart failure, with stage 5 chronic kidney disease, or end stage renal disease: Secondary | ICD-10-CM | POA: Diagnosis not present

## 2024-12-26 DIAGNOSIS — E785 Hyperlipidemia, unspecified: Secondary | ICD-10-CM | POA: Diagnosis not present

## 2024-12-26 HISTORY — DX: Cerebral infarction, unspecified: I63.9

## 2024-12-26 HISTORY — PX: REVISON OF ARTERIOVENOUS FISTULA: SHX6074

## 2024-12-26 LAB — POCT I-STAT, CHEM 8
BUN: 26 mg/dL — ABNORMAL HIGH (ref 6–20)
Calcium, Ion: 1.06 mmol/L — ABNORMAL LOW (ref 1.15–1.40)
Chloride: 93 mmol/L — ABNORMAL LOW (ref 98–111)
Creatinine, Ser: 5.4 mg/dL — ABNORMAL HIGH (ref 0.44–1.00)
Glucose, Bld: 297 mg/dL — ABNORMAL HIGH (ref 70–99)
HCT: 29 % — ABNORMAL LOW (ref 36.0–46.0)
Hemoglobin: 9.9 g/dL — ABNORMAL LOW (ref 12.0–15.0)
Potassium: 4.4 mmol/L (ref 3.5–5.1)
Sodium: 133 mmol/L — ABNORMAL LOW (ref 135–145)
TCO2: 29 mmol/L (ref 22–32)

## 2024-12-26 LAB — POCT PREGNANCY, URINE: Preg Test, Ur: NEGATIVE

## 2024-12-26 LAB — GLUCOSE, CAPILLARY
Glucose-Capillary: 273 mg/dL — ABNORMAL HIGH (ref 70–99)
Glucose-Capillary: 316 mg/dL — ABNORMAL HIGH (ref 70–99)
Glucose-Capillary: 286 mg/dL — ABNORMAL HIGH (ref 70–99)
Glucose-Capillary: 216 mg/dL — ABNORMAL HIGH (ref 70–99)
Glucose-Capillary: 195 mg/dL — ABNORMAL HIGH (ref 70–99)

## 2024-12-26 SURGERY — REVISON OF ARTERIOVENOUS FISTULA
Anesthesia: Monitor Anesthesia Care

## 2024-12-26 MED ORDER — MIDAZOLAM HCL 2 MG/2ML IJ SOLN
INTRAMUSCULAR | Status: AC
  Administered 2024-12-26: 1 mg via INTRAVENOUS
  Filled 2024-12-26: qty 2

## 2024-12-26 MED ORDER — PROPOFOL 500 MG/50ML IV EMUL
INTRAVENOUS | Status: DC | PRN
  Administered 2024-12-26: 100 ug/kg/min via INTRAVENOUS

## 2024-12-26 MED ORDER — HEPARIN 6000 UNIT IRRIGATION SOLUTION
Status: AC
  Filled 2024-12-26: qty 500

## 2024-12-26 MED ORDER — OXYCODONE-ACETAMINOPHEN 5-325 MG PO TABS: 1.0000 | ORAL_TABLET | Freq: Four times a day (QID) | ORAL | 0 refills | Status: AC | PRN

## 2024-12-26 MED ORDER — CEFAZOLIN SODIUM-DEXTROSE 2-4 GM/100ML-% IV SOLN
2.0000 g | INTRAVENOUS | Status: AC
  Administered 2024-12-26: 2 g via INTRAVENOUS
  Filled 2024-12-26: qty 100

## 2024-12-26 MED ORDER — HEMOSTATIC AGENTS (NO CHARGE) OPTIME
TOPICAL | Status: DC | PRN
  Administered 2024-12-26: 1 via TOPICAL

## 2024-12-26 MED ORDER — ORAL CARE MOUTH RINSE: 15.0000 mL | Freq: Once | OROMUCOSAL | Status: AC

## 2024-12-26 MED ORDER — FENTANYL CITRATE (PF) 100 MCG/2ML IJ SOLN: 50.0000 ug | Freq: Once | INTRAMUSCULAR | Status: AC

## 2024-12-26 MED ORDER — CHLORHEXIDINE GLUCONATE 4 % EX SOLN: 60.0000 mL | Freq: Once | CUTANEOUS | Status: DC

## 2024-12-26 MED ORDER — PHENYLEPHRINE HCL-NACL 20-0.9 MG/250ML-% IV SOLN
INTRAVENOUS | Status: DC | PRN
  Administered 2024-12-26: 20 ug/min via INTRAVENOUS

## 2024-12-26 MED ORDER — FENTANYL CITRATE (PF) 100 MCG/2ML IJ SOLN
INTRAMUSCULAR | Status: AC
  Administered 2024-12-26: 50 ug via INTRAVENOUS
  Filled 2024-12-26: qty 2

## 2024-12-26 MED ORDER — CHLORHEXIDINE GLUCONATE 0.12 % MT SOLN
15.0000 mL | Freq: Once | OROMUCOSAL | Status: AC
  Administered 2024-12-26: 15 mL via OROMUCOSAL
  Filled 2024-12-26: qty 15

## 2024-12-26 MED ORDER — FENTANYL CITRATE (PF) 100 MCG/2ML IJ SOLN
INTRAMUSCULAR | Status: DC | PRN
  Administered 2024-12-26: 50 ug via INTRAVENOUS
  Administered 2024-12-26: 25 ug via INTRAVENOUS

## 2024-12-26 MED ORDER — HEPARIN 6000 UNIT IRRIGATION SOLUTION
Status: DC | PRN
  Administered 2024-12-26: 1

## 2024-12-26 MED ORDER — MIDAZOLAM HCL (PF) 2 MG/2ML IJ SOLN: 1.0000 mg | Freq: Once | INTRAMUSCULAR | Status: AC

## 2024-12-26 MED ORDER — FENTANYL CITRATE (PF) 100 MCG/2ML IJ SOLN
INTRAMUSCULAR | Status: AC
  Filled 2024-12-26: qty 2

## 2024-12-26 MED ORDER — SODIUM CHLORIDE 0.9 % IV SOLN: INTRAVENOUS | Status: DC

## 2024-12-26 MED ORDER — ROPIVACAINE HCL 5 MG/ML IJ SOLN
INTRAMUSCULAR | Status: DC | PRN
  Administered 2024-12-26: 25 mL via PERINEURAL

## 2024-12-26 SURGICAL SUPPLY — 50 items
ARMBAND PINK RESTRICT EXTREMIT (MISCELLANEOUS) ×2 IMPLANT
BAG COUNTER SPONGE SURGICOUNT (BAG) ×2 IMPLANT
BAG DECANTER FOR FLEXI CONT (MISCELLANEOUS) ×2 IMPLANT
BENZOIN TINCTURE PRP APPL 2/3 (GAUZE/BANDAGES/DRESSINGS) ×2 IMPLANT
BIOPATCH RED 1 DISK 7.0 (GAUZE/BANDAGES/DRESSINGS) ×2 IMPLANT
CANISTER SUCTION 3000ML PPV (SUCTIONS) ×2 IMPLANT
CANNULA VESSEL 3MM 2 BLNT TIP (CANNULA) ×2 IMPLANT
CATH PALINDROME-P 19CM W/VT (CATHETERS) IMPLANT
CATH PALINDROME-P 28CM W/VT (CATHETERS) IMPLANT
CHLORAPREP W/TINT 26 (MISCELLANEOUS) ×2 IMPLANT
COVER PROBE W GEL 5X96 (DRAPES) ×2 IMPLANT
COVER SURGICAL LIGHT HANDLE (MISCELLANEOUS) ×2 IMPLANT
DRAPE C-ARM 42X72 X-RAY (DRAPES) ×2 IMPLANT
DRAPE CHEST BREAST 15X10 FENES (DRAPES) ×2 IMPLANT
DRSG TEGADERM 4X4.75 (GAUZE/BANDAGES/DRESSINGS) IMPLANT
ELECTRODE REM PT RTRN 9FT ADLT (ELECTROSURGICAL) ×2 IMPLANT
GAUZE 4X4 16PLY ~~LOC~~+RFID DBL (SPONGE) ×2 IMPLANT
GAUZE SPONGE 4X4 12PLY STRL (GAUZE/BANDAGES/DRESSINGS) ×2 IMPLANT
GLOVE BIO SURGEON STRL SZ8 (GLOVE) ×2 IMPLANT
GOWN STRL REUS W/ TWL LRG LVL3 (GOWN DISPOSABLE) ×4 IMPLANT
GOWN STRL REUS W/ TWL XL LVL3 (GOWN DISPOSABLE) ×2 IMPLANT
HEMOSTAT SNOW SURGICEL 2X4 (HEMOSTASIS) IMPLANT
INSERT FOGARTY SM (MISCELLANEOUS) IMPLANT
KIT BASIN OR (CUSTOM PROCEDURE TRAY) ×2 IMPLANT
KIT PALINDROME-P 55CM (CATHETERS) IMPLANT
KIT TURNOVER KIT B (KITS) ×2 IMPLANT
NEEDLE 18GX1X1/2 (RX/OR ONLY) (NEEDLE) ×2 IMPLANT
NEEDLE HYPO 25GX1X1/2 BEV (NEEDLE) ×2 IMPLANT
PACK CV ACCESS (CUSTOM PROCEDURE TRAY) ×2 IMPLANT
PACK SRG BSC III STRL LF ECLPS (CUSTOM PROCEDURE TRAY) ×2 IMPLANT
PAD ARMBOARD POSITIONER FOAM (MISCELLANEOUS) ×4 IMPLANT
SET MICROPUNCTURE 5F STIFF (MISCELLANEOUS) ×2 IMPLANT
SLING ARM FOAM STRAP LRG (SOFTGOODS) IMPLANT
SOAP 2 % CHG 4 OZ (WOUND CARE) ×2 IMPLANT
SOLN 0.9% NACL POUR BTL 1000ML (IV SOLUTION) ×2 IMPLANT
SOLN STERILE WATER BTL 1000 ML (IV SOLUTION) ×2 IMPLANT
STRIP CLOSURE SKIN 1/2X4 (GAUZE/BANDAGES/DRESSINGS) ×2 IMPLANT
SUT ETHILON 3 0 PS 1 (SUTURE) ×2 IMPLANT
SUT MNCRL AB 4-0 PS2 18 (SUTURE) ×2 IMPLANT
SUT PROLENE 5 0 C 1 36 (SUTURE) IMPLANT
SUT PROLENE 6 0 BV (SUTURE) ×2 IMPLANT
SUT VIC AB 3-0 SH 27X BRD (SUTURE) ×2 IMPLANT
SYR 10ML LL (SYRINGE) ×2 IMPLANT
SYR 20ML LL LF (SYRINGE) ×4 IMPLANT
SYR 3ML LL SCALE MARK (SYRINGE) ×2 IMPLANT
SYR 5ML LL (SYRINGE) ×2 IMPLANT
SYR CONTROL 10ML LL (SYRINGE) ×2 IMPLANT
TOWEL GREEN STERILE (TOWEL DISPOSABLE) ×2 IMPLANT
TOWEL GREEN STERILE FF (TOWEL DISPOSABLE) ×4 IMPLANT
UNDERPAD 30X36 HEAVY ABSORB (UNDERPADS AND DIAPERS) ×2 IMPLANT

## 2024-12-26 NOTE — Op Note (Signed)
 DATE OF SERVICE: 12/26/2024  PATIENT:  [name]  39 y.o. female  PRE-OPERATIVE DIAGNOSIS:  ESRD, aneurysmal AVF with ulcer  POST-OPERATIVE DIAGNOSIS:  Same  PROCEDURE:   Revision of left arm AVF with plication of aneurysm  SURGEON:  Surgeons and Role:    * Magda Debby SAILOR, MD - Primary  ASSISTANT: Curry Damme, PA-C  An experienced assistant was required given the complexity of this procedure and the standard of surgical care. My assistant helped with exposure through counter tension, suctioning, ligation and retraction to better visualize the surgical field.  My assistant expedited sewing during the case by following my sutures. Wherever I use the term we in the report, my assistant actively helped me with that portion of the procedure.  ANESTHESIA:   regional and MAC  EBL: min  BLOOD ADMINISTERED:none  DRAINS: none   LOCAL MEDICATIONS USED:  NONE  SPECIMEN:  none  COUNTS: confirmed correct .  TOURNIQUET:  none  PATIENT DISPOSITION:  PACU - hemodynamically stable.   Delay start of Pharmacological VTE agent (>24hrs) due to surgical blood loss or risk of bleeding: no  INDICATION FOR PROCEDURE: Angela [name] is a 39 y.o. female with ESRD on HD via LUE BB AVF. 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 The skin has improved since I last saw her. After careful discussion of risks, benefits, and alternatives the patient was offered a staged approach to repairing her aneurysms. We specifically discussed forgoing a TDC at this point and repairing the aneurysms in a staged fashion. The patient understood and wished to proceed.  OPERATIVE FINDINGS: successful repair of peripheral aneurysmal segment. Preserved normal thrill.   DESCRIPTION OF PROCEDURE: After identification of the patient in the pre-operative holding area, the patient was transferred to the operating room. The patient was positioned supine on the operating room table. Anesthesia was induced. The left  arm was prepped and draped in standard fashion. A surgical pause was performed confirming correct patient, procedure, and operative location.  An ellipse incision was planned around the peripheral most aneurysm.  Incision was made with a 10 blade and carried down through the subcutaneous tissue carefully to avoid injuring the fistula.  The fistula skeletonized from healthy inflow segment to healthy outflow segment.  The segments were encircled with Silastic Vesseloops.  The skin and aneurysm were then excised off of the fistula with an 11 blade and Potts scissors.  The aneurysm was repaired in a 2 layer closure with 5-0 Prolene.  Clamps were released.  Hemostasis was assured.  The wound was packed with Surgicel snow.  The wound was closed in layers using 3-0 Vicryl and 4-0 Monocryl.  Upon completion of the case instrument and sharps counts were confirmed correct. The patient was transferred to the PACU in good condition. I was present for all portions of the procedure.  FOLLOW UP PLAN: Okay to use central fistula for dialysis.  Return to care in 2 to 3 weeks for an incision check.  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

## 2024-12-26 NOTE — Transfer of Care (Signed)
 Immediate Anesthesia Transfer of Care Note  Patient: Angela [name]  Procedure(s) Performed: REVISON OF ARTERIOVENOUS FISTULA (Left)  Patient Location: PACU  Anesthesia Type:GA combined with regional for post-op pain  Level of Consciousness: drowsy  Airway & Oxygen Therapy: Patient Spontanous Breathing and Patient connected to nasal cannula oxygen  Post-op Assessment: Report given to RN and Post -op Vital signs reviewed and stable  Post vital signs: Reviewed and stable  Last Vitals:  Vitals Value Taken Time  BP 121/71 12/26/24 12:30  Temp    Pulse 67 12/26/24 12:31  Resp 12 12/26/24 12:31  SpO2 100 % 12/26/24 12:31  Vitals shown include unfiled device data.  Last Pain:  Vitals:   12/26/24 0750  TempSrc:   PainSc: 0-No pain         Complications: No notable events documented.

## 2024-12-26 NOTE — Inpatient Diabetes Management (Addendum)
 Inpatient Diabetes Program Recommendations  AACE/ADA: New Consensus Statement on Inpatient Glycemic Control  Target Ranges:  Prepandial:   less than 140 mg/dL      Peak postprandial:   less than 180 mg/dL (1-2 hours)      Critically ill patients:  140 - 180 mg/dL   Lab Results  Component Value Date   GLUCAP 286 (H) 12/26/2024   HGBA1C 6.6 (H) 10/11/2023    Latest Reference Range & Units 12/26/24 07:32 12/26/24 09:33 12/26/24 10:30  Glucose-Capillary 70 - 99 mg/dL 726 (H) 683 (H) 713 (H)   Review of Glycemic Control  Diabetes history: DM1  Outpatient Diabetes medications:   Per Office visit on 10/14/24 with Endocrinologist, Dr Dale on 10/14/24:  Omnipod 5/Dexcom G7 Insulin  Instructions Pump Settings  NovoLOG  U-100 Insulin  aspart 100 unit/mL Soln   Set prandial dose: 3 units with meals  AIT: 4 hours Max basal: 2 units/hr Max bolus: 10 units   Basal Rate  Total Basal Dose: 10.8 units/day  Time units/hr  12:00 AM 0.45   Blood Glucose Target  Time mg/dL  87:99 AM 849 - 849   Sensitivity Factor  Time mg/dL/unit  87:99 AM 75   Carb Ratio  Time g/unit  12:00 AM 1    Current orders for Inpatient glycemic control: insulin  pump    Inpatient Diabetes Program Recommendations:   Noted patient has Type 1 Diabetes.   If patient remains inpatient, will require insulin  pump order set OR basal and bolus insulin  if pump is removed.   Thanks,  Lavanda Search, RN, MSN, [facility]  Inpatient Diabetes Coordinator  Pager [phone] (8a-5p)

## 2024-12-26 NOTE — Anesthesia Procedure Notes (Signed)
 Anesthesia Regional Block: Axillary brachial plexus block   Pre-Anesthetic Checklist: , timeout performed,  Correct Patient, Correct Site, Correct Laterality,  Correct Procedure, Correct Position, site marked,  Risks and benefits discussed,  Surgical consent,  Pre-op evaluation,  At surgeon's request  Laterality: Left  Prep: chloraprep       Needles:  Injection technique: Single-shot  Needle Type: Echogenic Stimulator Needle     Needle Length: 10cm  Needle Gauge: 21   Needle insertion depth (cm): 4   Additional Needles:   Procedures:,,,, ultrasound used (permanent image in chart),,    Narrative:  Start time: 12/26/2024 10:43 AM End time: 12/26/2024 10:48 AM Injection made incrementally with aspirations every 5 mL.  Performed by: Personally  Anesthesiologist: Jerrye Sharper, MD  Additional Notes: Timeout performed. Patient sedated. Relevant anatomy ID'd using US . Incremental 2-5ml injection of LA with frequent aspiration. Patient tolerated procedure well.

## 2024-12-26 NOTE — Discharge Instructions (Signed)
 "  [facility]  Discharge Instructions  AV Fistula or Graft Surgery for Dialysis Access  Please refer to the following instructions for your post-procedure care. Your surgeon or physician assistant will discuss any changes with you.  Activity  You may drive the day following your surgery, if you are comfortable and no longer taking prescription pain medication. Resume full activity as the soreness in your incision resolves.  Bathing/Showering  You may shower after you go home. Keep your incision dry for 48 hours. Do not soak in a bathtub, hot tub, or swim until the incision heals completely. You may not shower if you have a hemodialysis catheter.  Incision Care  Clean your incision with mild soap and water  after 48 hours. Pat the area dry with a clean towel. You do not need a bandage unless otherwise instructed. Do not apply any ointments or creams to your incision. You may have skin glue on your incision. Do not peel it off. It will come off on its own in about one week. Your arm may swell a bit after surgery. To reduce swelling use pillows to elevate your arm so it is above your heart. Your doctor will tell you if you need to lightly wrap your arm with an ACE bandage.  Diet  Resume your normal diet. There are not special food restrictions following this procedure. In order to heal from your surgery, it is CRITICAL to get adequate nutrition. Your body requires vitamins, minerals, and protein. Vegetables are the best source of vitamins and minerals. Vegetables also provide the perfect balance of protein. Processed food has little nutritional value, so try to avoid this.  Medications  Resume taking all of your medications. If your incision is causing pain, you may take over-the counter pain relievers such as acetaminophen  (Tylenol ). If you were prescribed a stronger pain medication, please be aware these medications can cause nausea and constipation. Prevent  nausea by taking the medication with a snack or meal. Avoid constipation by drinking plenty of fluids and eating foods with high amount of fiber, such as fruits, vegetables, and grains.  Do not take Tylenol  if you are taking prescription pain medications.  Follow up Your surgeon may want to see you in the office following your access surgery. If so, this will be arranged at the time of your surgery.  Please call us  immediately for any of the following conditions:  Increased pain, redness, drainage (pus) from your incision site Fever of 101 degrees or higher Severe or worsening pain at your incision site Hand pain or numbness.  Reduce your risk of vascular disease:  Stop smoking. If you would like help, call [facility] at 1-800-QUIT-NOW (7373201327) or [place] at [phone]  Manage your cholesterol Maintain a desired weight Control your diabetes Keep your blood pressure down  Dialysis  It will take several weeks to several months for your new dialysis access to be ready for use. Your surgeon will determine when it is okay to use it. Your nephrologist will continue to direct your dialysis. You can continue to use your Permcath until your new access is ready for use.   12/26/2024 Angela [name] [id number] 1985/12/24  Surgeon(s): Magda Debby SAILOR, MD  Procedures: REVISON OF ARTERIOVENOUS FISTULA   May stick graft immediately  X May stick graft on designated area only: proximal to incision in mid and upper arm  X Do not stick left AV fistula in area of incision for 6 weeks  If you have any questions, please call the office at [phone]. "

## 2024-12-26 NOTE — Interval H&P Note (Signed)
 History and Physical Interval Note:  12/26/2024 10:27 AM  Angela [name]  has presented today for surgery, with the diagnosis of ESRD.  The various methods of treatment have been discussed with the patient and family. After consideration of risks, benefits and other options for treatment, the patient has consented to  Procedures: REVISON OF ARTERIOVENOUS FISTULA (Left) INSERTION OF DIALYSIS CATHETER (N/A) as a surgical intervention.  The patient's history has been reviewed, patient examined, no change in status, stable for surgery.  I have reviewed the patient's chart and labs.  Questions were answered to the patient's satisfaction.     Debby LOISE Robertson

## 2024-12-27 ENCOUNTER — Encounter (HOSPITAL_COMMUNITY): Payer: Self-pay | Admitting: Vascular Surgery

## 2024-12-27 NOTE — Anesthesia Postprocedure Evaluation (Signed)
"   Anesthesia Post Note  Patient: Angela [name]  Procedure(s) Performed: REVISON OF ARTERIOVENOUS FISTULA (Left)     Patient location during evaluation: PACU Anesthesia Type: Regional and MAC Level of consciousness: awake and alert and oriented Pain management: pain level controlled Vital Signs Assessment: post-procedure vital signs reviewed and stable Respiratory status: spontaneous breathing, nonlabored ventilation and respiratory function stable Cardiovascular status: stable and blood pressure returned to baseline Postop Assessment: no apparent nausea or vomiting Anesthetic complications: no   No notable events documented.  Last Vitals:  Vitals:   12/26/24 1245 12/26/24 1300  BP: 126/74 134/75  Pulse: 67 68  Resp: 14 13  Temp:  36.6 C  SpO2: 100% 94%    Last Pain:  Vitals:   12/26/24 1230  TempSrc:   PainSc: 0-No pain                 Ikeya Brockel A.      "

## 2025-01-02 NOTE — Progress Notes (Signed)
 Chief Complaint: Type 1 DM and Hypothyroidism   Patient gives verbal permission to use DAX  HPI: Ms.  Angela [name], 39 y.o. female, coming in for follow up  History of Present Illness The patient is a 39 year old female who presents for evaluation of diabetes and thyroid  disorder.  She continues to utilize her Omnipod and Dexcom devices, although she expresses dissatisfaction with the Omnipod. The sensor occasionally disconnects when she leaves the room where her Omnipod is located. She uses the Omnipod PDM. A Dexcom G7 sensor is currently in place. She is compliant with wearing her Dexcom device, but it does not consistently communicate with her pump.  She reports not always inputting her carbohydrate intake into pump for bolus.   She has Tresiba  at home in case her pump malfunctions. .  She is compliant with her thyroid  medication, taking it daily.   She was previously on the transplant list but was removed due to the initiation of oxygen therapy. She was advised to undergo a sleep apnea test, which she was unable to complete due to illness and needs to be rescheduled.  She is currently undergoing dialysis three times a week on Mondays, Wednesdays, and Fridays. She reports no foot wounds or sores.  Diet Recall: Beverages: Apple juice, approximately half a cup daily.  Dexcom CGM review: December 20, 2024-January 02, 2025  %Time glucose levels are in range: 15%   % Times above range (hyperglycemia): 95%  % Times below range (hypoglycemia):  -- <70 mg/dl: 0 -- <44 mg/dl: 0  Average blood glucose: 295 mg/dL HFP:89.5% CGM Sensor wear: 85%  Hypoglycemic Risk: low  Plan: Blood glucose trends reviewed and time in range - results discussed with patient and plan below  OmniPod settings   DM current regimen and BS control: Current meds-Novolog  via insulin  pump  Glucometer- Dexcom  Frequency of BS monitoring-CGM  BS range-average blood glucose 295 mg/dL past 2 weeks Adherence  to regimen-fair H/o Hypoglycemia-occasional H/o hypoglycemic unawareness-denies  Diabetes history: Type- 1 Previous control-9.4% 10/25-->9.7% 12/2024 H/o hospitalizations for hyper/hypoglycemia- Last CDE/ RD visit-   Complications: Kidney-ESRD on HD BP- BP Readings from Last 3 Encounters:  01/02/25 (!) 92/57  10/14/24 114/61  10/10/24 118/77   Eye- Neuropathy- + Cardiac-CVA 01/2023 Other-gastroparesis   Lifestyle:  Diet: Reports eating bacon and eggs. Drinks apple juice  Exercise:   Social: lives with her mother   Medications: New Medications Ordered This Visit  Medications   insulin  pump cartridge automated dosing (Omnipod 5 G6-G7 Pods, Gen 5,) crtg subcutaneous cartridge    Sig: Apply pod every 3 days to administer insulin  continuously.    Dispense:  10 each    Refill:  11    Ddx code: E10.65   insulin  aspart U-100 (NovoLOG  U-100 Insulin  aspart) 100 unit/mL injection    Sig: Use with insulin  pump       TDD 60 units    Dispense:  20 mL    Refill:  11   glucagon  (Baqsimi ) 3 mg/actuation nasal spray    Sig: Use 1 spray in 1 nostril once as directed for severe low blood sugar (hypoglycemia). Use as directed in case of severe hypoglycemia. Administer one spray into nostril, and check blood sugar in 15 minutes. If still low, administer one additional time.    Dispense:  2 each    Refill:  1   blood-glucose sensor (Dexcom G7 Sensor)    Sig: Apply one sensor every 10 days for continuous blood sugar monitoring with your  insulin  pump.    Dispense:  3 each    Refill:  11    Must be compatible with T-slim insulin  pump.      Allergies: Allergies[1]  PMH: Medical History[2]  Family history: Family History[3]  Social history: Social History   Socioeconomic History   Marital status: Single    Spouse name: Not on file   Number of children: Not on file   Years of education: Not on file   Highest education level: Not on file  Occupational History   Not on  file  Tobacco Use   Smoking status: Former    Current packs/day: 0.00    Average packs/day: 0.3 packs/day    Types: Cigarettes    Quit date: 04/29/2016    Years since quitting: 8.6   Smokeless tobacco: Never   Tobacco comments:    marjuanna  Substance and Sexual Activity   Alcohol use: No   Drug use: Unknown    Types: Marijuana    Comment: Drug use: Drug Use: Yes; smokes 2x a month LD 06/06/21   Sexual activity: Not on file  Other Topics Concern   Not on file  Social History Narrative   Not on file   Social Drivers of Health   Living Situation: Low Risk (10/14/2024)   Living Situation    What is your living situation today?: I have a steady place to live    Think about the place you live. Do you have problems with any of the following? Choose all that apply:: Not on file  Food Insecurity: Low Risk (10/14/2024)   Food vital sign    Within the past 12 months, you worried that your food would run out before you got money to buy more: Never true    Within the past 12 months, the food you bought just didn't last and you didn't have money to get more: Never true  Transportation Needs: No Transportation Needs (10/14/2024)   Transportation    In the past 12 months, has lack of reliable transportation kept you from medical appointments, meetings, work or from getting things needed for daily living? : No  Utilities: Low Risk (10/14/2024)   Utilities    In the past 12 months has the electric, gas, oil, or water  company threatened to shut off services in your home? : No  Safety: Low Risk (10/14/2024)   Safety    How often does anyone, including family and friends, physically hurt you?: Never    How often does anyone, including family and friends, insult or talk down to you?: Never    How often does anyone, including family and friends, threaten you with harm?: Never    How often does anyone, including family and friends, scream or curse at you?: Never  Alcohol  Screening: Not on file  Tobacco Use: Medium Risk (01/02/2025)   Patient History    Smoking Tobacco Use: Former    Smokeless Tobacco Use: Never    Passive Exposure: Not on file  Depression: Not At Risk (10/14/2024)   PHQ-2    PHQ-2 Score: 0  Social Connections: Socially Isolated (03/05/2024)   Received from [facility]   Social Connection and Isolation Panel    In a typical week, how many times do you talk on the phone with family, friends, or neighbors?: More than three times a week    How often do you get together with friends or relatives?: Once a week    How often do you attend church  or religious services?: Never    Do you belong to any clubs or organizations such as church groups, unions, fraternal or athletic groups, or school groups?: No    How often do you attend meetings of the clubs or organizations you belong to?: Never    Are you married, widowed, divorced, separated, never married, or living with a partner?: Divorced  Physicist, Medical Strain: Medium Risk (01/20/2022)   Received from [facility]   Overall Financial Resource Strain (CARDIA)    Difficulty of Paying Living Expenses: Somewhat hard    Review of Systems: As above  Physical Exam: Vitals:   01/02/25 1437  BP: (!) 92/57  Pulse: 76  SpO2: 97%   Weights (last 90 days)     Date/Time Weight   01/02/25 1437 59.5 kg (131 lb 3.2 oz)       Physical Exam General: Alert and oriented.  No acute distress Neck: No visible goiter CV: Heart rate 76 Respiratory: Unlabored breathing pattern Extremities: No edema   Data: Lab Results  Component Value Date   HGBA1C 8.7 (H) 07/16/2024   Lab Results  Component Value Date   CHOL 214 (H) 07/16/2024   TRIG 36 07/16/2024   HDL 111 07/16/2024   LDLCALC 92 07/16/2024   Lab Results  Component Value Date   CREATININE 5.89 (H) 07/16/2024   BUN 29 (H) 07/16/2024   NA 134 (L) 07/16/2024   K 5.1 07/16/2024   CL 90 (L) 07/16/2024   CO2 30 07/16/2024    Lab Results  Component Value Date   TSH 0.268 (L) 02/26/2024   Lab Results  Component Value Date   WBC 6.10 07/16/2024   HGB 11.4 (L) 07/16/2024   HCT 36.5 07/16/2024   MCV 86.9 07/16/2024   PLT 118 (L) 07/16/2024   No results found for: ALBUR  Immunization History  Administered Date(s) Administered   Influenza, Unspecified 09/26/2011, 11/01/2012, 09/20/2013, 10/28/2014, 09/18/2015, 09/30/2021   Influenza, split virus, trivalent, preservative 10/14/2009   Influenza,split virus, trivalent, PF 09/20/2013, 10/28/2014, 09/18/2015, 08/30/2017, 08/31/2018, 09/27/2019, 12/28/2020, 09/05/2023   Moderna SARS-CoV-2 Primary Series 12+ yrs 05/11/2020   Pneumococcal Conjugate Vaccine 20-Valent (PREVNAR-20) 6 wks+ 03/05/2024   Pneumococcal Polysaccharide Vaccine, 23 Valent (PNEUMOVAX-23) 2Y+ 04/30/2008, 02/25/2014   TDAP VACCINE (BOOSTRIX ,ADACEL) 7Y+ 11/01/2012   Tetanus Toxoid, Adsorbed 02/25/2014    Assessment/Plan:  In summary, 39 y.o. 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 Type 1 Diabetes mellitus: - A1c level has increased from 9.4% in 09/2024 to 9.7% currently.  -Dexcom CGM and Omnipod insulin  pump downloaded and reviewed with patient.  -She is being kicked out of auto-mode frequently.  Believes related to not always having Omnipod PDM near her.  -Referred to CDE to help set up Omnipod APP on her phone -Minimal bolus noted. Recommend patient for carb intake. Recommend she start administering 2 unit bolus with apple juice.  -Will increase basal rate  from 0.45 u/hr to 0.5 u/hr.  - NovoLog  and glucagon  nasal spray prescriptions will be sent to pharmacy.   2.Hypothyroidism:  - Currently taking thyroid  medication daily. Levothyroxine  175 mcg daily .  - Labs ordered today to check TSH levels. If thyroid  labs return normal, a refill of levothyroxine  will be sent to pharmacy.  Follow-up: The patient will follow up on 07/15/2025.   DM-related therapies  ASA: Not currently taking ACE/ARB: Not currently taking Statin: Lipitor Dilated eye exam: recommend annually Foot exam: needs at follow up  Flu Shot: annually  Albuminuria Screening: ESRD on HD HTN: Most recent BP is at goal <140/90 per ADA guidelines.   Patient Instructions  Adjusted basal rate from 0.45 to 0.5 u/hr  Referred to CDE to set up Omnipod app on your phone, hopefully this will keep you in auto mode more frequently   Please bolus for carb intake.  If drinking apple juice give 2 unit bolus   Thyroid  labs today   Orders Placed This Encounter  Medications   insulin  pump cartridge automated dosing (Omnipod 5 G6-G7 Pods, Gen 5,) crtg subcutaneous cartridge    Sig: Apply pod every 3 days to administer insulin  continuously.    Dispense:  10 each    Refill:  11    Ddx code: E10.65   insulin  aspart U-100 (NovoLOG  U-100 Insulin  aspart) 100 unit/mL injection    Sig: Use with insulin  pump       TDD 60 units    Dispense:  20 mL    Refill:  11   glucagon  (Baqsimi ) 3 mg/actuation nasal spray    Sig: Use 1 spray in 1 nostril once as directed for severe low blood sugar (hypoglycemia). Use as directed in case of severe hypoglycemia. Administer one spray into nostril, and check blood sugar in 15 minutes. If still  low, administer one additional time.    Dispense:  2 each    Refill:  1   blood-glucose sensor (Dexcom G7 Sensor)    Sig: Apply one sensor every 10 days for continuous blood sugar monitoring with your insulin  pump.    Dispense:  3 each    Refill:  11    Must be compatible with T-slim insulin  pump.   Orders Placed This Encounter  Procedures   TSH   T4, Free   Ambulatory referral to Diabetes Education and Medical Nutrition Therapy   POC Hemoglobin A1c     Follow-up: April with Dr. 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

## 2025-01-07 ENCOUNTER — Encounter: Payer: Self-pay | Admitting: Internal Medicine

## 2025-01-07 ENCOUNTER — Ambulatory Visit: Payer: MEDICAID | Attending: Internal Medicine | Admitting: Internal Medicine

## 2025-01-07 ENCOUNTER — Other Ambulatory Visit: Payer: Self-pay | Admitting: Internal Medicine

## 2025-01-07 VITALS — BP 117/69 | HR 78 | Temp 98.2°F | Ht 64.0 in | Wt 134.0 lb

## 2025-01-07 DIAGNOSIS — J9611 Chronic respiratory failure with hypoxia: Secondary | ICD-10-CM

## 2025-01-07 DIAGNOSIS — Z992 Dependence on renal dialysis: Secondary | ICD-10-CM | POA: Diagnosis not present

## 2025-01-07 DIAGNOSIS — E039 Hypothyroidism, unspecified: Secondary | ICD-10-CM

## 2025-01-07 DIAGNOSIS — N186 End stage renal disease: Secondary | ICD-10-CM

## 2025-01-07 DIAGNOSIS — E103593 Type 1 diabetes mellitus with proliferative diabetic retinopathy without macular edema, bilateral: Secondary | ICD-10-CM

## 2025-01-07 DIAGNOSIS — E1059 Type 1 diabetes mellitus with other circulatory complications: Secondary | ICD-10-CM

## 2025-01-07 DIAGNOSIS — I10 Essential (primary) hypertension: Secondary | ICD-10-CM | POA: Diagnosis not present

## 2025-01-07 DIAGNOSIS — F411 Generalized anxiety disorder: Secondary | ICD-10-CM

## 2025-01-07 DIAGNOSIS — E785 Hyperlipidemia, unspecified: Secondary | ICD-10-CM

## 2025-01-07 DIAGNOSIS — Z794 Long term (current) use of insulin: Secondary | ICD-10-CM | POA: Diagnosis not present

## 2025-01-07 DIAGNOSIS — F330 Major depressive disorder, recurrent, mild: Secondary | ICD-10-CM | POA: Diagnosis not present

## 2025-01-07 DIAGNOSIS — E1069 Type 1 diabetes mellitus with other specified complication: Secondary | ICD-10-CM | POA: Diagnosis not present

## 2025-01-07 DIAGNOSIS — Z9981 Dependence on supplemental oxygen: Secondary | ICD-10-CM

## 2025-01-07 MED ORDER — HYDRALAZINE HCL 100 MG PO TABS: 100.0000 mg | ORAL_TABLET | Freq: Three times a day (TID) | ORAL | 12 refills | Status: AC

## 2025-01-07 MED ORDER — LEVOTHYROXINE SODIUM 175 MCG PO TABS: 175.0000 ug | ORAL_TABLET | Freq: Every day | ORAL | 1 refills | Status: AC

## 2025-01-07 NOTE — Patient Instructions (Signed)
" °  VISIT SUMMARY: During your visit, we discussed your recent hospitalization for AV fistula revision, your ongoing management of diabetes, hypertension, chronic kidney disease, hypothyroidism, depression, and hyperlipidemia. We reviewed your current medications and made some adjustments to better manage your conditions.  YOUR PLAN: -TYPE 1 DIABETES MELLITUS: Type 1 diabetes is a condition where your body does not produce insulin , requiring you to use insulin  therapy. Your A1c level is elevated at 9.7%, and your blood sugars are in the target range 15% of the time. Continue with the current insulin  pump settings as adjusted by your endocrinologist and adhere to dietary recommendations to avoid sugary drinks and snacks.  -HYPERTENSION: Hypertension is high blood pressure. Your blood pressure is currently well-controlled at 117/69 mmHg. Continue taking your current medications, including hydralazine , and maintain your salt restriction. We refilled your hydralazine  prescription.  -END STAGE RENAL DISEASE ON DIALYSIS: End stage renal disease is the final stage of chronic kidney disease where your kidneys can no longer function on their own. You are managing this with dialysis three times a week. Continue with your current dialysis schedule.  -HYPOTHYROIDISM: Hypothyroidism is a condition where your thyroid  does not produce enough hormones. We plan to decrease your levothyroxine  dose to 175 mcg daily based on your recent lab results. We prescribed a 30-day supply with one refill and will recheck your thyroid  levels after one month on the new dose.  -DEPRESSION: Depression is a mood disorder causing persistent feelings of sadness and loss of interest. Your depression is improving, but you still experience anxiety. We encourage you to pursue behavioral health services for counseling and provided a list of providers in the [place] area.  -HYPERLIPIDEMIA: Hyperlipidemia is having high levels of fats in your  blood, which can increase your risk of heart disease. You are managing this with atorvastatin . We will check your lipid levels during your next thyroid  level check.  -ANEMIA OF CHRONIC KIDNEY DISEASE: Anemia is a condition where you do not have enough healthy red blood cells to carry adequate oxygen to your body's tissues. This is managed with iron supplementation during dialysis. Continue with iron supplementation as needed.  INSTRUCTIONS: Please follow up in one month for a recheck of your thyroid  levels and lipid levels. Continue with your current dialysis schedule and medication regimen. If you experience any new symptoms or have concerns, contact our office.    Contains text generated by [employer]bridge.   "

## 2025-01-07 NOTE — Progress Notes (Signed)
 "   Patient ID: Angela [name], female    DOB: 1986/12/01  MRN: [id number]  CC: Hospitalization Follow-up (Hospitalization f/u. /No questions / concerns/Already received flu vax)   Subjective: Angela [name] is a 39 y.o. female who presents for chronic ds management. 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  History of Present Illness Angela [name] is a 39 year old female with diabetes, hypertension, and chronic kidney disease who presents for follow-up after hospitalization and chronic disease management.  HD: She was hospitalized for two days earlier this mth for a revision of her HD AV fistula in the left arm. She attends dialysis sessions three times a week on Monday, Wednesday, and Friday. She has chronic anemia with a hemoglobin level of 10.6 as of August and receives iron during dialysis as needed.  HTN: She is currently taking hydralazine  100 mg three times a day, carvedilol  6.25 mg twice a day, diltiazem  60 mg every six hours, and furosemide  80 mg daily. Reports adherence. She monitors her blood pressure at home and limits her salt intake. Reports readings at home have been good but does not recall specifically.   Chronic Hypoxia: She endorses shortness of breath and uses oxygen at home all day, although she did not bring her portable oxygen to the appointment. No chest pains.  DM: She has type 1 diabetes with A1c of 9.7% 5 days ago at visit with [facility] endocrinology. Her continuous glucose monitor showed blood sugars in the  target range only 15% of the time. NP there adjusted her Omnipod basal rate from 0.5 to 1.0.  She uses Novolog  with her insulin  pump and does not use long-acting insulin . She reports good eating habits and avoids sugary drinks and snacks. - She tells me that she had her diabetic eye exam done around August of last year but does not recall the name of provider, states it was in [address]. Looks like referral was sent to [facility]. We will request record.  Hypothyroidism: she is taking levothyroxine  200 mcg daily. Her thyroid  level was checked five days ago in endocrinology clinic, but no adjustments have been made to her medication yet. TSH was <0.05, and free T4 was 1.3.  MDD/GAD: She reports ongoing depression and anxiety, with a lower depression score compared to August. She takes Lexapro  20 mg daily and Buspar  15 mg at bedtime. She has not pursued behavioral health services as discussed on previous visits but feels she would benefit from counseling.  She is on atorvastatin  for cholesterol management, although her cholesterol levels have not been checked recently.     Patient Active Problem List   Diagnosis Date Noted   Acute hypoxic respiratory failure (HCC) 07/19/2024   Subclavian vein stenosis, right 07/18/2024   Liver cirrhosis secondary to NASH (HCC) 04/04/2024   History of cerebral hemorrhage 10/11/2023   Acute hyperkalemia 10/10/2023   Steatosis (HCC) 08/22/2023   Malnutrition of moderate degree 08/10/2023   Acute respiratory failure with hypoxia (HCC) 08/07/2023   Numbness and tingling in right hand 08/01/2023  Chronic neck pain with abnormal neurologic examination 08/01/2023   Cervical radiculopathy at C8 08/01/2023   Proliferative diabetic retinopathy of both eyes with macular edema associated with type 1 diabetes mellitus (HCC) 05/11/2023   Protein calorie malnutrition 02/15/2023   Seizure disorder (HCC) 10/15/2021   ESRD on hemodialysis (HCC) 10/15/2021    Mallory-Weiss tear 11/16/2020   Chronic migraine without aura without status migrainosus, not intractable 04/09/2020   Occipital neuralgia of right side 04/09/2020   Anxiety about health 10/29/2019   Cannabis abuse, episodic 10/29/2019   Abnormal uterine bleeding (AUB) 08/13/2019   Ovarian mass, left 11/27/2018   Moderate major depression (HCC) 11/27/2018   Secondary hyperparathyroidism 05/01/2018   Anxiety and depression 06/23/2017   Insomnia 01/12/2016   Non compliance with medical treatment 01/05/2016   Mixed hyperlipidemia 01/05/2016   Uncontrolled type 1 diabetes mellitus with hyperglycemia, with long-term current use of insulin  (HCC) 12/10/2015   Neurogenic bladder 02/20/2015   Diabetic peripheral neuropathy associated with type 1 diabetes mellitus (HCC) 02/20/2015   Iron deficiency 02/13/2015   Asthma 09/30/2014   Diabetic retinopathy (HCC) 09/30/2014   Atypical squamous cells of undetermined significance (ASCUS) on Papanicolaou smear of cervix 08/11/2014   Diabetic gastroparesis associated with type 1 diabetes mellitus (HCC) 08/05/2014   Anemia in chronic renal disease 08/05/2014   Hypertension associated with diabetes (HCC) 07/11/2012   GERD (gastroesophageal reflux disease) 09/26/2011   Hypothyroidism 09/14/2006   Uncontrolled type 1 diabetes mellitus with hypoglycemia, with long-term current use of insulin  (HCC) 01/15/2000     Medications Ordered Prior to Encounter[1]  Allergies[2]  Social History   Socioeconomic History   Marital status: Divorced    Spouse name: Not on file   Number of children: 0   Years of education: 2 years of college    Highest education level: Not on file  Occupational History   Occupation: unemployed    Comment: worked at a group   Occupation: disability  Tobacco Use   Smoking status: Former    Current packs/day: 0.00    Average packs/day: 0.3 packs/day for 2.0 years (0.5 ttl pk-yrs)    Types: Cigarettes    Start date: 03/05/2011     Quit date: 03/04/2013    Years since quitting: 11.8   Smokeless tobacco: Never  Vaping Use   Vaping status: Never Used  Substance and Sexual Activity   Alcohol use: No    Alcohol/week: 0.0 standard drinks of alcohol   Drug use: Not Currently    Frequency: 4.0 times per week    Types: Marijuana   Sexual activity: Yes    Partners: Female    Birth control/protection: None    Comment: women preference   Other Topics Concern   Not on file  Social History Narrative   Occupation: currently unemployed   Single   Homosexual,     Used to be a gang member, got arrested for robbing a gas station (March - June 2012), is cleared now and lives away from her previous friends.          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        Update 04/09/2020   Has own apartment   Caffeine : maybe 2 cans of soda/day    Right handed      Update 03/21/2023   Lives with mother  Caffeine : once in awhile    Social Drivers of Health   Tobacco Use: Medium Risk (01/02/2025)   Received from [facility]   Patient History    Smoking Tobacco Use: Former    Smokeless Tobacco Use: Never    Passive Exposure: Not on file  Financial Resource Strain: Medium Risk (01/20/2022)   Overall Financial Resource Strain (CARDIA)    Difficulty of Paying Living Expenses: Somewhat hard  Food Insecurity: Low Risk (10/14/2024)   Received from [facility]   Epic    Within the past 12 months, you worried that your food would run out before you got money to buy more: Never true    Within the past 12 months, the food you bought just didn't last and you didn't have money to get more. : Never true  Transportation Needs: No Transportation Needs (10/14/2024)   Received from [facility]    In the past 12 months, has lack of reliable transportation kept you  from medical appointments, meetings, work or from getting things needed for daily living? : No  Physical Activity: Inactive (03/05/2024)   Exercise Vital Sign    Days of Exercise per Week: 0 days    Minutes of Exercise per Session: 0 min  Stress: Stress Concern Present (03/05/2024)   [employer] of Occupational Health - Occupational Stress Questionnaire    Feeling of Stress : Rather much  Social Connections: Socially Isolated (03/05/2024)   Social Connection and Isolation Panel    Frequency of Communication with Friends and Family: More than three times a week    Frequency of Social Gatherings with Friends and Family: Once a week    Attends Religious Services: Never    Database Administrator or Organizations: No    Attends Banker Meetings: Never    Marital Status: Divorced  Catering Manager Violence: Patient Unable To Answer (07/25/2024)   Epic    Fear of Current or Ex-Partner: Patient unable to answer    Emotionally Abused: Patient unable to answer    Physically Abused: Patient unable to answer    Sexually Abused: Patient unable to answer  Depression (PHQ2-9): Medium Risk (01/07/2025)   Depression (PHQ2-9)    PHQ-2 Score: 9  Alcohol Screen: Low Risk (03/05/2024)   Alcohol Screen    Last Alcohol Screening Score (AUDIT): 0  Housing: Low Risk (10/14/2024)   Received from [facility]   Epic    What is your living situation today?: I have a steady place to live    Think about the place you live. Do you have problems with any of the following? Choose all that apply:: Not on file  Utilities: Low Risk (10/14/2024)   Received from [facility]   Utilities    In the past 12 months has the electric, gas, oil, or water  company threatened to shut off services in your home? : No  Health Literacy: Adequate Health Literacy (03/05/2024)   B1300 Health Literacy    Frequency of need for help with medical instructions: Never    Family History  Problem Relation Age of Onset    Multiple sclerosis Mother    Hypothyroidism Mother    Stroke Mother        at age 13 yo   Migraines Mother    Hyperlipidemia Maternal Grandmother    Hypertension Maternal Grandmother    Heart disease Maternal Grandmother        unknown type   Diabetes Maternal Grandmother  Hypertension Maternal Grandfather    Prostate cancer Maternal Grandfather    Diabetes type I Maternal Grandfather    Breast cancer Paternal Grandmother    Cancer Neg Hx    Colon cancer Neg Hx    Esophageal cancer Neg Hx    Rectal cancer Neg Hx    Stomach cancer Neg Hx    Seizures Neg Hx     Past Surgical History:  Procedure Laterality Date   ESOPHAGOGASTRODUODENOSCOPY (EGD) WITH PROPOFOL  N/A 04/04/2024   Procedure: ESOPHAGOGASTRODUODENOSCOPY (EGD) WITH PROPOFOL ;  Surgeon: Charlanne Groom, MD;  Location: [facility];  Service: Gastroenterology;  Laterality: N/A;   FOOT FUSION Right 2006   put screws in it too (09/19/2013)   REVISON OF ARTERIOVENOUS FISTULA Left 12/26/2024   Procedure: REVISON OF ARTERIOVENOUS FISTULA;  Surgeon: Magda Debby SAILOR, MD;  Location: [facility] OR;  Service: Vascular;  Laterality: Left;    ROS: Review of Systems Negative except as stated above  PHYSICAL EXAM: BP 117/69 (BP Location: Right Arm, Patient Position: Sitting, Cuff Size: Small)   Pulse 78   Temp 98.2 F (36.8 C) (Oral)   Ht 5' 4 (1.626 m)   Wt 134 lb (60.8 kg)   SpO2 97%   BMI 23.00 kg/m   Physical Exam General appearance - alert, young to middle age AAF who appears chronically ill in NAD. Face is pale Mental status - flat affect.  She answers questions appropriately. Eyes -pale conjunctiva. Mouth - mucous membranes moist, pharynx normal without lesions Neck - supple, no significant adenopathy Chest - clear to auscultation, no wheezes, rales or rhonchi, symmetric air entry Heart -regular rate and rhythm.  Systolic ejection murmur 2/6 left upper sternal border Extremities -no lower extremity edema     01/07/2025     2:18 PM 08/15/2024    3:59 PM 08/06/2024   11:15 AM  Depression screen PHQ 2/9  Decreased Interest 1 0 0  Down, Depressed, Hopeless 2 1 0  PHQ - 2 Score 3 1 0  Altered sleeping 2 3   Tired, decreased energy 1 3   Change in appetite 0 0   Feeling bad or failure about yourself  1 3   Trouble concentrating 1 1   Moving slowly or fidgety/restless 1 3   Suicidal thoughts 0 0   PHQ-9 Score 9 14    Difficult doing work/chores Not difficult at all Very difficult      Data saved with a previous flowsheet row definition      01/07/2025    2:18 PM 08/15/2024    4:00 PM 08/06/2024   11:15 AM 03/05/2024   12:02 PM  GAD 7 : Generalized Anxiety Score  Nervous, Anxious, on Edge 2 3  0  1   Control/stop worrying 2 3  0  1   Worry too much - different things 1 0  0  1   Trouble relaxing 1 0  0  1   Restless 1 0  0  1   Easily annoyed or irritable 1 1  0  1   Afraid - awful might happen 1 3  0  1   Total GAD 7 Score 9 10 0 7  Anxiety Difficulty Somewhat difficult Somewhat difficult Not difficult at all Not difficult at all     Data saved with a previous flowsheet row definition        Latest Ref Rng & Units 12/26/2024    8:06 AM 07/20/2024    7:32 AM 07/19/2024  2:54 AM  CMP  Glucose 70 - 99 mg/dL 702  840  847   BUN 6 - 20 mg/dL 26  24  35   Creatinine 0.44 - 1.00 mg/dL 4.59  3.62  2.30   Sodium 135 - 145 mmol/L 133  133  134   Potassium 3.5 - 5.1 mmol/L 4.4  5.0  5.1   Chloride 98 - 111 mmol/L 93  90  91   CO2 22 - 32 mmol/L  27  27   Calcium  8.9 - 10.3 mg/dL  89.2  9.7    Lipid Panel     Component Value Date/Time   CHOL 247 (H) 01/28/2023 0300   CHOL 239 (H) 12/28/2020 1432   TRIG 88 01/31/2023 0548   HDL 119 01/28/2023 0300   HDL 72 12/28/2020 1432   CHOLHDL 2.1 01/28/2023 0300   VLDL 13 01/28/2023 0300   LDLCALC 115 (H) 01/28/2023 0300   LDLCALC 151 (H) 12/28/2020 1432    CBC    Component Value Date/Time   WBC 8.3 07/19/2024 0254   RBC 4.01 07/19/2024 0254   HGB 9.9  (L) 12/26/2024 0806   HGB 9.4 (L) 03/30/2023 1439   HCT 29.0 (L) 12/26/2024 0806   HCT 30.0 (L) 03/30/2023 1439   PLT 181 07/19/2024 0254   PLT 174 03/30/2023 1439   MCV 91.5 07/19/2024 0254   MCV 89 03/30/2023 1439   MCH 26.9 07/19/2024 0254   MCHC 29.4 (L) 07/19/2024 0254   RDW 19.9 (H) 07/19/2024 0254   RDW 14.7 03/30/2023 1439   LYMPHSABS 1.4 07/18/2024 1646   LYMPHSABS 1.3 03/21/2023 1214   MONOABS 0.7 07/18/2024 1646   EOSABS 0.4 07/18/2024 1646   EOSABS 0.8 (H) 03/21/2023 1214   BASOSABS 0.1 07/18/2024 1646   BASOSABS 0.1 03/21/2023 1214    ASSESSMENT AND PLAN: 1. Type 1 diabetes mellitus with proliferative retinopathy of both eyes, macular edema presence unspecified, unspecified proliferative retinopathy type (HCC) (Primary) Not at goal based on recent A1c done through endocrinology office at [facility].  Basal rate of NovoLog  recently adjusted on her insulin  pump.  Strongly encourage healthy eating habits avoiding sugary drinks and sugary snacks and incorporating fresh fruits and vegetables into her diet.  2. Chronic respiratory failure with hypoxia, on home oxygen therapy (HCC) Pulse ox today on room air is good.  She does not have her portable oxygen with her.  Can check pulse ox intermittently at home and when she is out in the public to determine continued need.  3. ESRD on dialysis [facility]) Patient has been adherent with going to dialysis Monday Wednesday and Fridays.  4. Acquired hypothyroidism Recent thyroid  hormone level check in the range for overactive thyroid .  Will decrease levothyroxine  to 175 mcg daily.  After being on this dose for 1 month, advised that she returns to the lab to have level rechecked. I forgot to tell pt to hold off on taking the Levothyroxine  for 2 weeks then restart at the 175 mcg dose. I have sent her a Mychart message. - levothyroxine  (SYNTHROID ) 175 MCG tablet; Take 1 tablet (175 mcg total) by mouth daily before breakfast.   Dispense: 30 tablet; Refill: 1 - TSH; Future  5. Major depressive disorder, recurrent episode, mild 6. GAD (generalized anxiety disorder) PHQ-9 and GAD-7 scores improved from last visit.  Patient reports she is doing better.  She will continue Lexapro  20 mg daily and BuSpar  15 mg at bedtime.  Encouraged her to get established  with a therapist.  We gave her printed handout of behavioral health specialists in the [place] area  7. Hyperlipidemia due to type 1 diabetes mellitus (HCC) Continue atorvastatin  40 mg daily. - Lipid panel; Future  8. Hypertension associated with type 1 diabetes mellitus (HCC) Controlled.  Continue  hydralazine  100 mg three times a day, carvedilol  6.25 mg twice a day, diltiazem  60 mg every six hours, and furosemide  80 mg daily - hydrALAZINE  (APRESOLINE ) 100 MG tablet; Take 1 tablet (100 mg total) by mouth 3 (three) times daily.  Dispense: 90 tablet; Refill: 12   Patient was given the opportunity to ask questions.  Patient verbalized understanding of the plan and was able to repeat key elements of the plan.   This documentation was completed using Paediatric nurse.  Any transcriptional errors are unintentional.  Orders Placed This Encounter  Procedures   TSH   Lipid panel     Requested Prescriptions   Signed Prescriptions Disp Refills   hydrALAZINE  (APRESOLINE ) 100 MG tablet 90 tablet 12    Sig: Take 1 tablet (100 mg total) by mouth 3 (three) times daily.   levothyroxine  (SYNTHROID ) 175 MCG tablet 30 tablet 1    Sig: Take 1 tablet (175 mcg total) by mouth daily before breakfast.    Return in about 4 months (around 05/07/2025) for Give lab appt for 1 mth.  Barnie Louder, MD, FACP     [1]  Current Outpatient Medications on File Prior to Visit  Medication Sig Dispense Refill   albuterol  (PROVENTIL  HFA) 108 (90 Base) MCG/ACT inhaler Inhale 2 puffs into the lungs every 6 (six) hours as needed for wheezing or shortness of breath. 8 g  3   amLODipine  (NORVASC ) 10 MG tablet TAKE 1 TABLET(10 MG) BY MOUTH AT BEDTIME 90 tablet 3   atorvastatin  (LIPITOR) 40 MG tablet Take 1 tablet by mouth at bedtime.     B Complex-C-Folic Acid  (DIALYVITE TABLET) TABS Take 1 tablet by mouth daily.     busPIRone  (BUSPAR ) 15 MG tablet Take 1 tablet (15 mg total) by mouth at bedtime. 90 tablet 2   carvedilol  (COREG ) 6.25 MG tablet TAKE 1 TABLET(6.25 MG) BY MOUTH TWICE DAILY WITH A MEAL 180 tablet 0   cinacalcet  (SENSIPAR ) 30 MG tablet Take 30 mg by mouth every evening.     Continuous Glucose Sensor (DEXCOM G7 SENSOR) MISC Inject 1 Application into the skin. Every 10 days     Darbepoetin Alfa  (ARANESP , ALBUMIN  FREE,) 100 MCG/0.5ML SOSY injection Inject 0.5 mLs (100 mcg total) into the skin every Friday at 6 PM.Strength: 100 MCG/0.5ML 4 mL 2   diltiazem  (CARDIZEM ) 60 MG tablet TAKE 1 TABLET(60 MG) BY MOUTH EVERY 6 HOURS 120 tablet 2   escitalopram  (LEXAPRO ) 20 MG tablet TAKE 1 TABLET(20 MG) BY MOUTH DAILY 30 tablet 0   furosemide  (LASIX ) 80 MG tablet Take 1 tablet by mouth daily.     gabapentin  (NEURONTIN ) 100 MG capsule 1 tab Po Q am and noon and 2 tabs PO Q p.m 120 capsule 6   insulin  aspart (NOVOLOG  FLEXPEN) 100 UNIT/ML FlexPen Inject 2-3 Units into the skin 3 (three) times daily with meals. Per sliding scale 15 mL 6   insulin  aspart (NOVOLOG ) 100 UNIT/ML injection To use with insulin  pump 10 mL PRN   Insulin  Pen Needle (PEN NEEDLES) 31G X 8 MM MISC UAD 100 each 6   levETIRAcetam  (KEPPRA ) 500 MG tablet TAKE 1 TABLET BY MOUTH TWICE DAILY WITH 250MG (1/2 TABLET) SUPPLEMENT  AFTER DIALYSIS 198 tablet 0   LOKELMA  5 g packet Take 1 packet by mouth daily.     metoCLOPramide  (REGLAN ) 5 MG tablet TAKE 1 TABLET(5 MG) BY MOUTH TWICE DAILY WITH A MEAL 180 tablet 1   ondansetron  (ZOFRAN -ODT) 8 MG disintegrating tablet Take 1 tablet (8 mg total) by mouth every 8 (eight) hours as needed for nausea or vomiting. 60 tablet 1   oxyCODONE -acetaminophen  (PERCOCET) 5-325 MG  tablet Take 1 tablet by mouth every 6 (six) hours as needed for severe pain (pain score 7-10). 14 tablet 0   pantoprazole  (PROTONIX ) 40 MG tablet TAKE 1 TABLET(40 MG) BY MOUTH DAILY 90 tablet 1   RENVELA  800 MG tablet Take 1,600 mg by mouth 3 (three) times daily.     Current Facility-Administered Medications on File Prior to Visit  Medication Dose Route Frequency Provider Last Rate Last Admin   adenosine  (diagnostic) (ADENOSCAN ) infusion 32.4 mg  0.56 mg/kg Intravenous Once Jeffrie Oneil BROCKS, MD      [2]  Allergies Allergen Reactions   Iron Other (See Comments)    intolerance   Zestril  [Lisinopril ] Other (See Comments)    Hyperkalemia    Porcine (Pork) Protein-Containing Drug Products Other (See Comments)    Patient does not eat pork    Bactrim [Sulfamethoxazole-Trimethoprim] Hives and Itching   Feraheme [Ferumoxytol] Itching   "

## 2025-01-08 ENCOUNTER — Encounter: Payer: Self-pay | Admitting: Internal Medicine

## 2025-01-09 ENCOUNTER — Other Ambulatory Visit: Payer: Self-pay | Admitting: Pharmacist

## 2025-01-09 ENCOUNTER — Ambulatory Visit: Payer: MEDICAID | Attending: Surgery | Admitting: Physician Assistant

## 2025-01-09 VITALS — BP 114/68 | HR 75 | Temp 98.3°F | Resp 16 | Ht 64.0 in | Wt 132.1 lb

## 2025-01-09 DIAGNOSIS — T829XXA Unspecified complication of cardiac and vascular prosthetic device, implant and graft, initial encounter: Secondary | ICD-10-CM

## 2025-01-09 DIAGNOSIS — Z992 Dependence on renal dialysis: Secondary | ICD-10-CM

## 2025-01-09 DIAGNOSIS — N186 End stage renal disease: Secondary | ICD-10-CM

## 2025-01-09 MED ORDER — AMLODIPINE BESYLATE 10 MG PO TABS: 10.0000 mg | ORAL_TABLET | Freq: Every day | ORAL | Status: AC

## 2025-01-09 NOTE — Progress Notes (Signed)
 " POST OPERATIVE OFFICE NOTE    CC:  F/u for surgery  HPI:  Angela [name] is a 39 y.o. female who is here for incision check.  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 Magda.  Postoperatively she was cleared to use the central portion of her fistula for dialysis.  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  She returns today for follow-up.  She is doing well at today's office visit.  She feels like her left arm incision is nearly healed.  She denies any purulent drainage, dehiscence, or fevers.  Dialysis has been able to stick the upper part of her fistula without issue.  She does report continued pain at the aneurysm site at the upper part of her fistula.  This especially hurts when they are cannulating the fistula at dialysis.   Allergies[1]  Current Outpatient Medications  Medication Sig Dispense Refill   albuterol  (PROVENTIL  HFA) 108 (90 Base) MCG/ACT inhaler Inhale 2 puffs into the lungs every 6 (six) hours as needed for wheezing or shortness of breath. 8 g 3   amLODipine  (NORVASC ) 10 MG tablet TAKE 1 TABLET(10 MG) BY MOUTH AT BEDTIME 90 tablet 3   atorvastatin  (LIPITOR) 40 MG tablet Take 1 tablet by mouth at bedtime.     B Complex-C-Folic Acid  (DIALYVITE TABLET) TABS Take 1 tablet by mouth daily.     busPIRone  (BUSPAR ) 15 MG tablet Take 1 tablet (15 mg total) by mouth at bedtime. 90 tablet 2   carvedilol  (COREG ) 6.25 MG tablet TAKE 1 TABLET(6.25 MG) BY MOUTH TWICE DAILY WITH A MEAL 180 tablet 0   cinacalcet  (SENSIPAR ) 30 MG tablet Take 30 mg by mouth every evening.     Continuous Glucose Sensor (DEXCOM G7 SENSOR) MISC Inject 1 Application into the skin. Every 10 days     Darbepoetin Alfa  (ARANESP , ALBUMIN  FREE,) 100 MCG/0.5ML SOSY injection Inject 0.5 mLs (100 mcg total) into the skin every Friday at 6 PM.Strength: 100 MCG/0.5ML 4 mL 2   diltiazem   (CARDIZEM ) 60 MG tablet TAKE 1 TABLET(60 MG) BY MOUTH EVERY 6 HOURS 120 tablet 2   escitalopram  (LEXAPRO ) 20 MG tablet TAKE 1 TABLET(20 MG) BY MOUTH DAILY 30 tablet 0   furosemide  (LASIX ) 80 MG tablet Take 1 tablet by mouth daily.     gabapentin  (NEURONTIN ) 100 MG capsule 1 tab Po Q am and noon and 2 tabs PO Q p.m 120 capsule 6   hydrALAZINE  (APRESOLINE ) 100 MG tablet Take 1 tablet (100 mg total) by mouth 3 (three) times daily. 90 tablet 12   insulin  aspart (NOVOLOG  FLEXPEN) 100 UNIT/ML FlexPen Inject 2-3 Units into the skin 3 (three) times daily with meals. Per sliding scale 15 mL 6   insulin  aspart (NOVOLOG ) 100 UNIT/ML injection To use with insulin  pump 10 mL PRN   Insulin  Pen Needle (PEN NEEDLES) 31G X 8 MM MISC UAD 100 each 6   levETIRAcetam  (KEPPRA ) 500 MG tablet TAKE 1 TABLET BY MOUTH TWICE DAILY WITH 250MG (1/2 TABLET) SUPPLEMENT AFTER DIALYSIS 198 tablet 0   levothyroxine  (SYNTHROID ) 175 MCG tablet Take 1 tablet (175 mcg total) by mouth daily before breakfast. 30 tablet 1   LOKELMA  5 g packet Take 1 packet by mouth daily.     metoCLOPramide  (REGLAN ) 5 MG tablet TAKE 1 TABLET(5 MG) BY MOUTH TWICE DAILY WITH A MEAL 180 tablet 1  ondansetron  (ZOFRAN -ODT) 8 MG disintegrating tablet Take 1 tablet (8 mg total) by mouth every 8 (eight) hours as needed for nausea or vomiting. 60 tablet 1   oxyCODONE -acetaminophen  (PERCOCET) 5-325 MG tablet Take 1 tablet by mouth every 6 (six) hours as needed for severe pain (pain score 7-10). 14 tablet 0   pantoprazole  (PROTONIX ) 40 MG tablet TAKE 1 TABLET(40 MG) BY MOUTH DAILY 90 tablet 1   RENVELA  800 MG tablet Take 1,600 mg by mouth 3 (three) times daily.     No current facility-administered medications for this visit.   Facility-Administered Medications Ordered in Other Visits  Medication Dose Route Frequency Provider Last Rate Last Admin   adenosine  (diagnostic) (ADENOSCAN ) infusion 32.4 mg  0.56 mg/kg Intravenous Once Jeffrie Oneil BROCKS, MD        ROS:   See HPI  Physical Exam:  Incision: Left arm incision nearly healed without signs of infection, 1 Steri-Strip present Extremities: Left upper arm fistula with good thrill.  Upper part of fistula with aneurysm without wounds Neuro: Intact motor and sensation of left arm   Assessment/Plan:  This is a 39 y.o. female who is here for incision check  - The patient recently underwent left upper extremity fistula revision with plication.  This was done for aneurysm degeneration with ulceration.  Her left arm incision is now nearly healed without signs of infection - Her fistula continues to have a good thrill.  Dialysis has been able to use the upper part of her fistula without issue.  This area is still aneurysmal however and causes her pain, especially with cannulation -I have discussed with the patient that we can still pursue revision and plication of her central aneurysmal area once she has reached 6 weeks of healing.  She would like to do this surgery. - For the time being, dialysis should continue to use the central portion of her fistula for access until her left arm incision is completely healed.  Our office will call the patient to schedule surgery in a month for plication of the fistula.  This can be done on a nondialysis day with Dr. Magda Ahmed Holster, PA-C [facility] [phone]   Call MD: Sheree      [1]  Allergies Allergen Reactions   Iron Other (See Comments)    intolerance   Zestril  [Lisinopril ] Other (See Comments)    Hyperkalemia    Porcine (Pork) Protein-Containing Drug Products Other (See Comments)    Patient does not eat pork    Bactrim [Sulfamethoxazole-Trimethoprim] Hives and Itching   Feraheme [Ferumoxytol] Itching   "

## 2025-01-11 ENCOUNTER — Other Ambulatory Visit: Payer: Self-pay | Admitting: Internal Medicine

## 2025-01-11 MED ORDER — GABAPENTIN 100 MG PO CAPS: ORAL_CAPSULE | ORAL | 6 refills | Status: AC

## 2025-01-14 ENCOUNTER — Other Ambulatory Visit: Payer: Self-pay

## 2025-01-14 DIAGNOSIS — N186 End stage renal disease: Secondary | ICD-10-CM

## 2025-01-15 ENCOUNTER — Other Ambulatory Visit: Payer: Self-pay | Admitting: Internal Medicine

## 2025-01-21 ENCOUNTER — Other Ambulatory Visit: Payer: Self-pay | Admitting: Internal Medicine

## 2025-01-21 DIAGNOSIS — F331 Major depressive disorder, recurrent, moderate: Secondary | ICD-10-CM

## 2025-02-04 ENCOUNTER — Ambulatory Visit: Payer: Self-pay

## 2025-02-06 ENCOUNTER — Ambulatory Visit (HOSPITAL_COMMUNITY): Admit: 2025-02-06 | Payer: MEDICAID | Admitting: Vascular Surgery

## 2025-03-06 ENCOUNTER — Ambulatory Visit: Payer: MEDICAID | Admitting: Family Medicine

## 2025-05-08 ENCOUNTER — Ambulatory Visit: Payer: Self-pay | Admitting: Internal Medicine

## 2025-07-22 ENCOUNTER — Ambulatory Visit: Payer: MEDICAID | Admitting: Family Medicine
